# Patient Record
Sex: Male | Born: 1940 | Race: Black or African American | Hispanic: No | Marital: Married | Smoking: Former smoker
Health system: Southern US, Community
[De-identification: ages and names within clinical notes are randomized; demographics above are authoritative.]

## PROBLEM LIST (undated history)

## (undated) DIAGNOSIS — F32A Depression, unspecified: Secondary | ICD-10-CM

## (undated) DIAGNOSIS — D631 Anemia in chronic kidney disease: Secondary | ICD-10-CM

## (undated) DIAGNOSIS — N400 Enlarged prostate without lower urinary tract symptoms: Secondary | ICD-10-CM

## (undated) DIAGNOSIS — I2510 Atherosclerotic heart disease of native coronary artery without angina pectoris: Secondary | ICD-10-CM

## (undated) DIAGNOSIS — N189 Chronic kidney disease, unspecified: Secondary | ICD-10-CM

## (undated) DIAGNOSIS — N3000 Acute cystitis without hematuria: Secondary | ICD-10-CM

## (undated) DIAGNOSIS — I5042 Chronic combined systolic (congestive) and diastolic (congestive) heart failure: Secondary | ICD-10-CM

## (undated) DIAGNOSIS — N183 Chronic kidney disease, stage 3 unspecified: Secondary | ICD-10-CM

## (undated) DIAGNOSIS — Z9981 Dependence on supplemental oxygen: Secondary | ICD-10-CM

## (undated) DIAGNOSIS — D638 Anemia in other chronic diseases classified elsewhere: Secondary | ICD-10-CM

## (undated) DIAGNOSIS — I10 Essential (primary) hypertension: Secondary | ICD-10-CM

## (undated) DIAGNOSIS — M1990 Unspecified osteoarthritis, unspecified site: Secondary | ICD-10-CM

## (undated) DIAGNOSIS — I739 Peripheral vascular disease, unspecified: Secondary | ICD-10-CM

## (undated) DIAGNOSIS — Z7901 Long term (current) use of anticoagulants: Secondary | ICD-10-CM

## (undated) DIAGNOSIS — R778 Other specified abnormalities of plasma proteins: Secondary | ICD-10-CM

## (undated) DIAGNOSIS — M109 Gout, unspecified: Secondary | ICD-10-CM

## (undated) DIAGNOSIS — Z95810 Presence of automatic (implantable) cardiac defibrillator: Secondary | ICD-10-CM

## (undated) DIAGNOSIS — E039 Hypothyroidism, unspecified: Secondary | ICD-10-CM

## (undated) DIAGNOSIS — K219 Gastro-esophageal reflux disease without esophagitis: Secondary | ICD-10-CM

## (undated) DIAGNOSIS — L97919 Non-pressure chronic ulcer of unspecified part of right lower leg with unspecified severity: Secondary | ICD-10-CM

## (undated) DIAGNOSIS — I428 Other cardiomyopathies: Secondary | ICD-10-CM

## (undated) DIAGNOSIS — I447 Left bundle-branch block, unspecified: Secondary | ICD-10-CM

## (undated) DIAGNOSIS — I480 Paroxysmal atrial fibrillation: Secondary | ICD-10-CM

## (undated) DIAGNOSIS — I639 Cerebral infarction, unspecified: Secondary | ICD-10-CM

## (undated) DIAGNOSIS — R5381 Other malaise: Secondary | ICD-10-CM

## (undated) DIAGNOSIS — Z950 Presence of cardiac pacemaker: Secondary | ICD-10-CM

## (undated) DIAGNOSIS — R413 Other amnesia: Secondary | ICD-10-CM

## (undated) DIAGNOSIS — L97929 Non-pressure chronic ulcer of unspecified part of left lower leg with unspecified severity: Secondary | ICD-10-CM

## (undated) DIAGNOSIS — E870 Hyperosmolality and hypernatremia: Secondary | ICD-10-CM

## (undated) DIAGNOSIS — F329 Major depressive disorder, single episode, unspecified: Secondary | ICD-10-CM

## (undated) DIAGNOSIS — IMO0001 Reserved for inherently not codable concepts without codable children: Secondary | ICD-10-CM

## (undated) DIAGNOSIS — E876 Hypokalemia: Secondary | ICD-10-CM

## (undated) DIAGNOSIS — R195 Other fecal abnormalities: Secondary | ICD-10-CM

## (undated) DIAGNOSIS — J189 Pneumonia, unspecified organism: Secondary | ICD-10-CM

## (undated) DIAGNOSIS — E875 Hyperkalemia: Secondary | ICD-10-CM

## (undated) DIAGNOSIS — E1129 Type 2 diabetes mellitus with other diabetic kidney complication: Secondary | ICD-10-CM

## (undated) DIAGNOSIS — R7989 Other specified abnormal findings of blood chemistry: Secondary | ICD-10-CM

## (undated) DIAGNOSIS — Z9289 Personal history of other medical treatment: Secondary | ICD-10-CM

## (undated) DIAGNOSIS — E785 Hyperlipidemia, unspecified: Secondary | ICD-10-CM

## (undated) HISTORY — DX: Benign prostatic hyperplasia without lower urinary tract symptoms: N40.0

## (undated) HISTORY — DX: Type 2 diabetes mellitus with other diabetic kidney complication: E11.29

## (undated) HISTORY — DX: Long term (current) use of anticoagulants: Z79.01

## (undated) HISTORY — DX: Anemia in chronic kidney disease: D63.1

## (undated) HISTORY — DX: Depression, unspecified: F32.A

## (undated) HISTORY — DX: Hyperkalemia: E87.5

## (undated) HISTORY — DX: Unspecified osteoarthritis, unspecified site: M19.90

## (undated) HISTORY — DX: Chronic kidney disease, stage 3 unspecified: N18.30

## (undated) HISTORY — PX: OTHER SURGICAL HISTORY: SHX169

## (undated) HISTORY — DX: Chronic combined systolic (congestive) and diastolic (congestive) heart failure: I50.42

## (undated) HISTORY — DX: Other specified abnormalities of plasma proteins: R77.8

## (undated) HISTORY — DX: Personal history of other medical treatment: Z92.89

## (undated) HISTORY — DX: Major depressive disorder, single episode, unspecified: F32.9

## (undated) HISTORY — DX: Anemia in other chronic diseases classified elsewhere: D63.8

## (undated) HISTORY — DX: Hyperlipidemia, unspecified: E78.5

## (undated) HISTORY — DX: Hyperosmolality and hypernatremia: E87.0

## (undated) HISTORY — DX: Atherosclerotic heart disease of native coronary artery without angina pectoris: I25.10

## (undated) HISTORY — DX: Other disorders of bilirubin metabolism: E80.6

## (undated) HISTORY — DX: Chronic kidney disease, stage 3 (moderate): N18.3

## (undated) HISTORY — DX: Other specified abnormal findings of blood chemistry: R79.89

## (undated) HISTORY — DX: Other amnesia: R41.3

## (undated) HISTORY — PX: CARDIAC CATHETERIZATION: SHX172

## (undated) HISTORY — DX: Acute cystitis without hematuria: N30.00

## (undated) HISTORY — DX: Chronic kidney disease, unspecified: N18.9

## (undated) HISTORY — DX: Other fecal abnormalities: R19.5

## (undated) HISTORY — DX: Gastro-esophageal reflux disease without esophagitis: K21.9

## (undated) HISTORY — DX: Hypokalemia: E87.6

## (undated) HISTORY — DX: Essential (primary) hypertension: I10

## (undated) HISTORY — DX: Other malaise: R53.81

---

## 2002-11-29 ENCOUNTER — Ambulatory Visit (HOSPITAL_COMMUNITY): Admission: RE | Admit: 2002-11-29 | Discharge: 2002-11-29 | Payer: Self-pay | Admitting: Internal Medicine

## 2002-11-29 ENCOUNTER — Encounter: Payer: Self-pay | Admitting: Internal Medicine

## 2003-06-04 ENCOUNTER — Encounter: Payer: Self-pay | Admitting: Emergency Medicine

## 2003-06-04 ENCOUNTER — Encounter: Payer: Self-pay | Admitting: Neurology

## 2003-06-04 ENCOUNTER — Encounter (INDEPENDENT_AMBULATORY_CARE_PROVIDER_SITE_OTHER): Payer: Self-pay | Admitting: Cardiology

## 2003-06-04 ENCOUNTER — Observation Stay (HOSPITAL_COMMUNITY): Admission: EM | Admit: 2003-06-04 | Discharge: 2003-06-04 | Payer: Self-pay | Admitting: Emergency Medicine

## 2003-06-08 ENCOUNTER — Inpatient Hospital Stay (HOSPITAL_COMMUNITY): Admission: EM | Admit: 2003-06-08 | Discharge: 2003-06-10 | Payer: Self-pay | Admitting: Emergency Medicine

## 2003-06-08 ENCOUNTER — Encounter: Payer: Self-pay | Admitting: Emergency Medicine

## 2003-06-08 ENCOUNTER — Encounter: Payer: Self-pay | Admitting: Neurology

## 2003-06-09 ENCOUNTER — Encounter: Payer: Self-pay | Admitting: Neurology

## 2003-06-10 ENCOUNTER — Emergency Department (HOSPITAL_COMMUNITY): Admission: EM | Admit: 2003-06-10 | Discharge: 2003-06-10 | Payer: Self-pay | Admitting: Emergency Medicine

## 2003-06-14 ENCOUNTER — Inpatient Hospital Stay (HOSPITAL_COMMUNITY): Admission: EM | Admit: 2003-06-14 | Discharge: 2003-06-17 | Payer: Self-pay | Admitting: Emergency Medicine

## 2003-06-15 ENCOUNTER — Encounter: Payer: Self-pay | Admitting: Neurology

## 2003-06-16 ENCOUNTER — Encounter: Payer: Self-pay | Admitting: Internal Medicine

## 2003-06-17 ENCOUNTER — Encounter: Payer: Self-pay | Admitting: Cardiology

## 2003-06-25 ENCOUNTER — Encounter: Admission: RE | Admit: 2003-06-25 | Discharge: 2003-06-27 | Payer: Self-pay | Admitting: Internal Medicine

## 2003-07-05 ENCOUNTER — Encounter: Payer: Self-pay | Admitting: Internal Medicine

## 2003-07-05 ENCOUNTER — Encounter: Admission: RE | Admit: 2003-07-05 | Discharge: 2003-07-05 | Payer: Self-pay | Admitting: Internal Medicine

## 2004-02-13 ENCOUNTER — Emergency Department (HOSPITAL_COMMUNITY): Admission: EM | Admit: 2004-02-13 | Discharge: 2004-02-13 | Payer: Self-pay | Admitting: Emergency Medicine

## 2004-10-28 ENCOUNTER — Ambulatory Visit: Payer: Self-pay

## 2004-11-06 ENCOUNTER — Ambulatory Visit: Payer: Self-pay | Admitting: Cardiology

## 2004-11-16 ENCOUNTER — Ambulatory Visit: Payer: Self-pay | Admitting: Internal Medicine

## 2004-12-09 ENCOUNTER — Ambulatory Visit: Payer: Self-pay | Admitting: *Deleted

## 2004-12-24 ENCOUNTER — Ambulatory Visit: Payer: Self-pay | Admitting: Internal Medicine

## 2005-01-07 ENCOUNTER — Ambulatory Visit: Payer: Self-pay | Admitting: Cardiology

## 2005-01-28 ENCOUNTER — Ambulatory Visit: Payer: Self-pay | Admitting: Cardiology

## 2005-02-11 ENCOUNTER — Ambulatory Visit: Payer: Self-pay | Admitting: Internal Medicine

## 2005-03-11 ENCOUNTER — Ambulatory Visit: Payer: Self-pay | Admitting: Cardiology

## 2005-03-29 ENCOUNTER — Ambulatory Visit: Payer: Self-pay | Admitting: Cardiology

## 2005-04-19 ENCOUNTER — Ambulatory Visit: Payer: Self-pay | Admitting: Cardiology

## 2005-04-28 ENCOUNTER — Ambulatory Visit: Payer: Self-pay

## 2005-04-28 ENCOUNTER — Ambulatory Visit: Payer: Self-pay | Admitting: Internal Medicine

## 2005-05-18 ENCOUNTER — Ambulatory Visit: Payer: Self-pay | Admitting: *Deleted

## 2005-06-09 ENCOUNTER — Ambulatory Visit: Payer: Self-pay | Admitting: Internal Medicine

## 2005-06-15 ENCOUNTER — Ambulatory Visit: Payer: Self-pay | Admitting: Cardiology

## 2005-07-13 ENCOUNTER — Ambulatory Visit: Payer: Self-pay | Admitting: Cardiology

## 2005-08-10 ENCOUNTER — Ambulatory Visit: Payer: Self-pay | Admitting: Cardiology

## 2005-08-24 ENCOUNTER — Ambulatory Visit: Payer: Self-pay | Admitting: Cardiology

## 2005-09-03 ENCOUNTER — Ambulatory Visit: Payer: Self-pay | Admitting: Internal Medicine

## 2005-10-18 ENCOUNTER — Ambulatory Visit: Payer: Self-pay | Admitting: Internal Medicine

## 2005-11-03 ENCOUNTER — Ambulatory Visit: Payer: Self-pay | Admitting: Cardiology

## 2005-11-17 ENCOUNTER — Ambulatory Visit: Payer: Self-pay

## 2005-11-24 ENCOUNTER — Ambulatory Visit: Payer: Self-pay | Admitting: *Deleted

## 2005-12-01 ENCOUNTER — Ambulatory Visit: Payer: Self-pay | Admitting: Cardiology

## 2005-12-21 ENCOUNTER — Ambulatory Visit: Payer: Self-pay | Admitting: *Deleted

## 2005-12-30 ENCOUNTER — Ambulatory Visit: Payer: Self-pay | Admitting: Cardiology

## 2006-01-10 ENCOUNTER — Ambulatory Visit: Payer: Self-pay | Admitting: Internal Medicine

## 2006-01-24 ENCOUNTER — Ambulatory Visit: Payer: Self-pay | Admitting: Internal Medicine

## 2006-02-04 ENCOUNTER — Ambulatory Visit: Payer: Self-pay | Admitting: Cardiovascular Disease

## 2006-02-11 ENCOUNTER — Ambulatory Visit: Payer: Self-pay | Admitting: Internal Medicine

## 2006-02-25 ENCOUNTER — Ambulatory Visit: Payer: Self-pay | Admitting: Internal Medicine

## 2006-03-07 ENCOUNTER — Ambulatory Visit: Payer: Self-pay | Admitting: Internal Medicine

## 2006-03-28 ENCOUNTER — Ambulatory Visit: Payer: Self-pay | Admitting: Cardiology

## 2006-04-01 ENCOUNTER — Ambulatory Visit: Payer: Self-pay | Admitting: Endocrinology

## 2006-04-25 ENCOUNTER — Ambulatory Visit: Payer: Self-pay | Admitting: Internal Medicine

## 2006-05-12 ENCOUNTER — Ambulatory Visit: Payer: Self-pay | Admitting: Cardiology

## 2006-06-10 ENCOUNTER — Ambulatory Visit: Payer: Self-pay | Admitting: Cardiovascular Disease

## 2006-06-20 ENCOUNTER — Ambulatory Visit: Payer: Self-pay | Admitting: Cardiovascular Disease

## 2006-07-07 ENCOUNTER — Ambulatory Visit: Payer: Self-pay | Admitting: Cardiology

## 2006-09-14 ENCOUNTER — Ambulatory Visit: Payer: Self-pay | Admitting: Cardiology

## 2006-09-22 ENCOUNTER — Ambulatory Visit: Payer: Self-pay | Admitting: Internal Medicine

## 2006-10-12 ENCOUNTER — Ambulatory Visit: Payer: Self-pay | Admitting: *Deleted

## 2006-11-09 ENCOUNTER — Ambulatory Visit: Payer: Self-pay | Admitting: Cardiology

## 2006-12-21 ENCOUNTER — Ambulatory Visit: Payer: Self-pay | Admitting: Internal Medicine

## 2006-12-21 LAB — CONVERTED CEMR LAB
BUN: 18 mg/dL (ref 6–23)
Cholesterol: 142 mg/dL (ref 0–200)
Creatinine, Ser: 2.1 mg/dL — ABNORMAL HIGH (ref 0.4–1.5)
HDL: 37.3 mg/dL — ABNORMAL LOW (ref >39.0)
VLDL: 18 mg/dL (ref 0–40)

## 2007-01-04 ENCOUNTER — Ambulatory Visit: Payer: Self-pay | Admitting: Internal Medicine

## 2007-01-30 ENCOUNTER — Ambulatory Visit: Payer: Self-pay | Admitting: Cardiology

## 2007-02-13 ENCOUNTER — Ambulatory Visit: Payer: Self-pay | Admitting: Cardiology

## 2007-02-22 ENCOUNTER — Ambulatory Visit: Payer: Self-pay | Admitting: *Deleted

## 2007-04-05 ENCOUNTER — Ambulatory Visit: Payer: Self-pay | Admitting: Cardiology

## 2007-04-06 ENCOUNTER — Ambulatory Visit: Payer: Self-pay | Admitting: Internal Medicine

## 2007-04-14 ENCOUNTER — Emergency Department (HOSPITAL_COMMUNITY): Admission: EM | Admit: 2007-04-14 | Discharge: 2007-04-14 | Payer: Self-pay | Admitting: Emergency Medicine

## 2007-04-19 ENCOUNTER — Ambulatory Visit: Payer: Self-pay | Admitting: Internal Medicine

## 2007-05-10 ENCOUNTER — Ambulatory Visit: Payer: Self-pay | Admitting: Cardiology

## 2007-06-07 ENCOUNTER — Ambulatory Visit: Payer: Self-pay | Admitting: Cardiology

## 2007-07-05 ENCOUNTER — Ambulatory Visit: Payer: Self-pay | Admitting: Internal Medicine

## 2007-08-02 ENCOUNTER — Ambulatory Visit: Payer: Self-pay | Admitting: Internal Medicine

## 2007-08-16 ENCOUNTER — Ambulatory Visit: Payer: Self-pay

## 2007-08-28 ENCOUNTER — Emergency Department (HOSPITAL_COMMUNITY): Admission: EM | Admit: 2007-08-28 | Discharge: 2007-08-28 | Payer: Self-pay | Admitting: Emergency Medicine

## 2007-09-13 ENCOUNTER — Ambulatory Visit: Payer: Self-pay | Admitting: Internal Medicine

## 2007-09-18 ENCOUNTER — Encounter: Payer: Self-pay | Admitting: *Deleted

## 2007-09-18 DIAGNOSIS — N183 Chronic kidney disease, stage 3 (moderate): Secondary | ICD-10-CM

## 2007-09-18 DIAGNOSIS — E785 Hyperlipidemia, unspecified: Secondary | ICD-10-CM

## 2007-09-18 DIAGNOSIS — I6350 Cerebral infarction due to unspecified occlusion or stenosis of unspecified cerebral artery: Secondary | ICD-10-CM | POA: Insufficient documentation

## 2007-10-11 ENCOUNTER — Ambulatory Visit: Payer: Self-pay | Admitting: Cardiology

## 2007-11-08 ENCOUNTER — Ambulatory Visit: Payer: Self-pay | Admitting: Cardiology

## 2007-12-29 ENCOUNTER — Ambulatory Visit: Payer: Self-pay | Admitting: Cardiology

## 2008-01-26 ENCOUNTER — Ambulatory Visit: Payer: Self-pay | Admitting: Cardiology

## 2008-03-01 ENCOUNTER — Ambulatory Visit: Payer: Self-pay | Admitting: Internal Medicine

## 2008-04-01 ENCOUNTER — Ambulatory Visit: Payer: Self-pay | Admitting: Internal Medicine

## 2008-04-01 DIAGNOSIS — M1990 Unspecified osteoarthritis, unspecified site: Secondary | ICD-10-CM

## 2008-04-01 DIAGNOSIS — M79609 Pain in unspecified limb: Secondary | ICD-10-CM

## 2008-04-02 ENCOUNTER — Encounter: Payer: Self-pay | Admitting: Internal Medicine

## 2008-04-02 ENCOUNTER — Ambulatory Visit: Payer: Self-pay | Admitting: Cardiology

## 2008-04-02 LAB — CONVERTED CEMR LAB
CO2: 33 meq/L — ABNORMAL HIGH (ref 19–32)
Chloride: 100 meq/L (ref 96–112)
Glucose, Bld: 126 mg/dL — ABNORMAL HIGH (ref 70–99)
Sed Rate: 32 mm/hr — ABNORMAL HIGH (ref 0–16)
Uric Acid, Serum: 10.1 mg/dL — ABNORMAL HIGH (ref 4.0–7.8)

## 2008-04-19 ENCOUNTER — Encounter: Payer: Self-pay | Admitting: Internal Medicine

## 2008-04-30 ENCOUNTER — Ambulatory Visit: Payer: Self-pay | Admitting: Cardiovascular Disease

## 2008-05-01 ENCOUNTER — Encounter: Payer: Self-pay | Admitting: Internal Medicine

## 2008-07-11 ENCOUNTER — Ambulatory Visit: Payer: Self-pay | Admitting: Internal Medicine

## 2008-09-03 ENCOUNTER — Ambulatory Visit: Payer: Self-pay | Admitting: Cardiology

## 2008-09-17 ENCOUNTER — Ambulatory Visit: Payer: Self-pay | Admitting: Cardiology

## 2008-10-08 ENCOUNTER — Ambulatory Visit: Payer: Self-pay | Admitting: Internal Medicine

## 2008-11-05 ENCOUNTER — Ambulatory Visit: Payer: Self-pay | Admitting: Cardiovascular Disease

## 2008-11-20 ENCOUNTER — Ambulatory Visit: Payer: Self-pay | Admitting: Internal Medicine

## 2008-12-03 ENCOUNTER — Ambulatory Visit: Payer: Self-pay | Admitting: Internal Medicine

## 2008-12-11 ENCOUNTER — Ambulatory Visit: Payer: Self-pay | Admitting: Cardiovascular Disease

## 2008-12-19 ENCOUNTER — Ambulatory Visit: Payer: Self-pay | Admitting: Internal Medicine

## 2009-01-02 ENCOUNTER — Ambulatory Visit: Payer: Self-pay | Admitting: Cardiovascular Disease

## 2009-01-28 ENCOUNTER — Ambulatory Visit: Payer: Self-pay | Admitting: Cardiology

## 2009-02-07 ENCOUNTER — Ambulatory Visit: Payer: Self-pay | Admitting: Cardiovascular Disease

## 2009-02-21 ENCOUNTER — Ambulatory Visit: Payer: Self-pay | Admitting: Internal Medicine

## 2009-03-20 ENCOUNTER — Ambulatory Visit: Payer: Self-pay | Admitting: Cardiovascular Disease

## 2009-04-22 ENCOUNTER — Ambulatory Visit: Payer: Self-pay | Admitting: Internal Medicine

## 2009-04-22 DIAGNOSIS — Z8679 Personal history of other diseases of the circulatory system: Secondary | ICD-10-CM | POA: Insufficient documentation

## 2009-04-22 DIAGNOSIS — K219 Gastro-esophageal reflux disease without esophagitis: Secondary | ICD-10-CM

## 2009-04-22 DIAGNOSIS — R21 Rash and other nonspecific skin eruption: Secondary | ICD-10-CM | POA: Insufficient documentation

## 2009-04-22 DIAGNOSIS — N400 Enlarged prostate without lower urinary tract symptoms: Secondary | ICD-10-CM | POA: Insufficient documentation

## 2009-04-29 ENCOUNTER — Ambulatory Visit: Payer: Self-pay | Admitting: Cardiology

## 2009-05-20 ENCOUNTER — Encounter: Payer: Self-pay | Admitting: *Deleted

## 2009-05-28 ENCOUNTER — Ambulatory Visit: Payer: Self-pay | Admitting: Cardiology

## 2009-05-28 LAB — CONVERTED CEMR LAB: Protime: 15.7

## 2009-06-25 ENCOUNTER — Encounter: Payer: Self-pay | Admitting: *Deleted

## 2009-07-11 ENCOUNTER — Ambulatory Visit: Payer: Self-pay | Admitting: Internal Medicine

## 2009-07-11 LAB — CONVERTED CEMR LAB: Prothrombin Time: 24.9 s

## 2009-07-21 ENCOUNTER — Telehealth: Payer: Self-pay | Admitting: Internal Medicine

## 2009-07-24 ENCOUNTER — Ambulatory Visit: Payer: Self-pay | Admitting: Cardiology

## 2009-07-24 LAB — CONVERTED CEMR LAB: Prothrombin Time: 21.4 s

## 2009-08-10 ENCOUNTER — Emergency Department (HOSPITAL_COMMUNITY): Admission: EM | Admit: 2009-08-10 | Discharge: 2009-08-10 | Payer: Self-pay | Admitting: Emergency Medicine

## 2009-08-13 ENCOUNTER — Ambulatory Visit: Payer: Self-pay | Admitting: Internal Medicine

## 2009-08-13 DIAGNOSIS — T25229A Burn of second degree of unspecified foot, initial encounter: Secondary | ICD-10-CM

## 2009-08-14 ENCOUNTER — Ambulatory Visit: Payer: Self-pay | Admitting: Cardiovascular Disease

## 2009-08-27 ENCOUNTER — Ambulatory Visit: Payer: Self-pay | Admitting: Internal Medicine

## 2009-09-18 ENCOUNTER — Encounter (INDEPENDENT_AMBULATORY_CARE_PROVIDER_SITE_OTHER): Payer: Self-pay | Admitting: Pharmacist

## 2009-09-22 ENCOUNTER — Ambulatory Visit: Payer: Self-pay | Admitting: Internal Medicine

## 2009-09-22 LAB — CONVERTED CEMR LAB: POC INR: 3.8

## 2009-10-06 ENCOUNTER — Ambulatory Visit: Payer: Self-pay | Admitting: Cardiovascular Disease

## 2009-10-27 ENCOUNTER — Ambulatory Visit: Payer: Self-pay | Admitting: Internal Medicine

## 2009-11-06 ENCOUNTER — Telehealth: Payer: Self-pay | Admitting: Internal Medicine

## 2009-12-29 ENCOUNTER — Ambulatory Visit: Payer: Self-pay | Admitting: Internal Medicine

## 2009-12-29 ENCOUNTER — Encounter (INDEPENDENT_AMBULATORY_CARE_PROVIDER_SITE_OTHER): Payer: Self-pay | Admitting: Cardiology

## 2009-12-29 LAB — CONVERTED CEMR LAB: POC INR: 1.6

## 2010-01-21 ENCOUNTER — Ambulatory Visit: Payer: Self-pay | Admitting: Internal Medicine

## 2010-02-09 ENCOUNTER — Telehealth: Payer: Self-pay | Admitting: Internal Medicine

## 2010-02-13 ENCOUNTER — Ambulatory Visit: Payer: Self-pay | Admitting: Cardiology

## 2010-02-25 ENCOUNTER — Encounter: Admission: RE | Admit: 2010-02-25 | Discharge: 2010-02-25 | Payer: Self-pay | Admitting: Podiatry

## 2010-04-15 ENCOUNTER — Ambulatory Visit: Payer: Self-pay | Admitting: Internal Medicine

## 2010-04-15 LAB — CONVERTED CEMR LAB: POC INR: 1.7

## 2010-05-06 ENCOUNTER — Ambulatory Visit: Payer: Self-pay | Admitting: Internal Medicine

## 2010-05-06 LAB — CONVERTED CEMR LAB: POC INR: 1.9

## 2010-05-27 ENCOUNTER — Ambulatory Visit: Payer: Self-pay | Admitting: Cardiology

## 2010-05-27 LAB — CONVERTED CEMR LAB: POC INR: 2.6

## 2010-06-17 ENCOUNTER — Ambulatory Visit: Payer: Self-pay | Admitting: Internal Medicine

## 2010-06-17 DIAGNOSIS — R413 Other amnesia: Secondary | ICD-10-CM | POA: Insufficient documentation

## 2010-06-17 DIAGNOSIS — E538 Deficiency of other specified B group vitamins: Secondary | ICD-10-CM | POA: Insufficient documentation

## 2010-06-17 DIAGNOSIS — R5382 Chronic fatigue, unspecified: Secondary | ICD-10-CM

## 2010-06-18 DIAGNOSIS — E1129 Type 2 diabetes mellitus with other diabetic kidney complication: Secondary | ICD-10-CM

## 2010-06-18 LAB — CONVERTED CEMR LAB
ALT: 16 units/L (ref 0–53)
AST: 19 units/L (ref 0–37)
BUN: 17 mg/dL (ref 6–23)
Basophils Relative: 0.2 % (ref 0.0–3.0)
Bilirubin Urine: NEGATIVE
Chloride: 106 meq/L (ref 96–112)
Cholesterol: 257 mg/dL — ABNORMAL HIGH (ref 0–200)
Eosinophils Relative: 1 % (ref 0.0–5.0)
Folate: 7.1 ng/mL
GFR calc non Af Amer: 62.68 mL/min (ref >60)
HCT: 40 % (ref 39.0–52.0)
Hemoglobin, Urine: NEGATIVE
Hemoglobin: 13.3 g/dL (ref 13.0–17.0)
Hgb A1c MFr Bld: 7.6 % — ABNORMAL HIGH (ref 4.6–6.5)
Lymphs Abs: 1.3 10*3/uL (ref 0.7–4.0)
MCV: 80.7 fL (ref 78.0–100.0)
Monocytes Absolute: 0.5 10*3/uL (ref 0.1–1.0)
Monocytes Relative: 5.4 % (ref 3.0–12.0)
Neutro Abs: 7.5 10*3/uL (ref 1.4–7.7)
Nitrite: NEGATIVE
Potassium: 3.3 meq/L — ABNORMAL LOW (ref 3.5–5.1)
Pro B Natriuretic peptide (BNP): 41.1 pg/mL (ref 0.0–100.0)
RBC: 4.96 M/uL (ref 4.22–5.81)
Sodium: 148 meq/L — ABNORMAL HIGH (ref 135–145)
TSH: 1.47 microintl units/mL (ref 0.35–5.50)
Total Bilirubin: 0.5 mg/dL (ref 0.3–1.2)
Total Protein, Urine: 100 mg/dL
Total Protein: 6.9 g/dL (ref 6.0–8.3)
Urine Glucose: NEGATIVE mg/dL
Urobilinogen, UA: 1 (ref 0.0–1.0)
VLDL: 33.4 mg/dL (ref 0.0–40.0)
Vitamin B-12: 184 pg/mL — ABNORMAL LOW (ref 211–911)
WBC: 9.5 10*3/uL (ref 4.5–10.5)

## 2010-07-13 ENCOUNTER — Ambulatory Visit: Payer: Self-pay | Admitting: Cardiology

## 2010-07-13 LAB — CONVERTED CEMR LAB: POC INR: 3.3

## 2010-07-16 ENCOUNTER — Ambulatory Visit: Payer: Self-pay | Admitting: Internal Medicine

## 2010-07-17 ENCOUNTER — Ambulatory Visit: Payer: Self-pay | Admitting: Internal Medicine

## 2010-08-03 ENCOUNTER — Ambulatory Visit: Payer: Self-pay | Admitting: Cardiology

## 2010-08-21 ENCOUNTER — Ambulatory Visit: Payer: Self-pay | Admitting: Cardiology

## 2010-08-21 LAB — CONVERTED CEMR LAB: POC INR: 2.5

## 2010-10-21 ENCOUNTER — Telehealth: Payer: Self-pay | Admitting: Internal Medicine

## 2010-10-22 ENCOUNTER — Ambulatory Visit: Payer: Self-pay | Admitting: Cardiology

## 2010-10-22 LAB — CONVERTED CEMR LAB: POC INR: 2.1

## 2010-11-19 ENCOUNTER — Ambulatory Visit: Payer: Self-pay | Admitting: Cardiovascular Disease

## 2010-11-19 LAB — CONVERTED CEMR LAB: POC INR: 2.7

## 2010-11-23 ENCOUNTER — Telehealth: Payer: Self-pay | Admitting: Internal Medicine

## 2010-12-17 ENCOUNTER — Ambulatory Visit: Payer: Self-pay

## 2010-12-31 HISTORY — PX: TRANSTHORACIC ECHOCARDIOGRAM: SHX275

## 2011-01-12 ENCOUNTER — Ambulatory Visit
Admission: RE | Admit: 2011-01-12 | Discharge: 2011-01-12 | Payer: Self-pay | Source: Home / Self Care | Attending: Internal Medicine | Admitting: Internal Medicine

## 2011-01-21 NOTE — Medication Information (Signed)
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 no     Any missed doses?: yes     Details: Missed a dose 3 or 4 days ago.   Is patient compliant with meds? yes       Allergies: 1)  Ace Inhibitors  Anticoagulation Management History:      The patient is taking warfarin and comes in today for a routine follow up visit.  Positive risk factors for bleeding include an age of 72 years or older, history of CVA/TIA, and presence of serious comorbidities.  The bleeding index is 'high risk'.  Positive CHADS2 values include History of CHF, History of HTN, History of Diabetes, and Prior Stroke/CVA/TIA.  Negative CHADS2 values include Age > 4 years old.  The start date was 07/09/2003.  Anticoagulation responsible Aysia Lowder: Riley Kill MD, Maisie Fus.  Cuvette Lot#: [id number].  Exp: 09/2011.    Anticoagulation Management Assessment/Plan:      [name] current anticoagulation dose is Coumadin 5 mg tabs: Take as directed by coumadin clinic..  The target INR is 2 - 3.  The next INR is due 08/21/2010.  Anticoagulation instructions were given to patient.  Results were reviewed/authorized by Bethena Midget, RN, BSN.  He was notified by Gweneth Fritter, PharmD Candidate.         Prior Anticoagulation Instructions: INR 3.3 Today only take 1/2 pill then resume 1 pill everyday except 1 1/2 pills on Mondays, Wednesdays and Fridays. Recheck in 3 weeks.   Current Anticoagulation Instructions: INR 2.4  Take 1 tablet (5mg ) every day except take 1.5 tablets (7.5mg ) on Mondays and Fridays.  Recheck in 3 weeks.

## 2011-01-21 NOTE — Progress Notes (Signed)
Summary: Labetalol ?  Phone Note From Pharmacy   [facility]  [address] [phone]* Summary of Call: rec eRf request for Labetalol 1 by mouth three times a day  #90. Our med list says 1 by mouth once daily.  Which sig is correct? Initial call taken by: Lanier Prude, [facility]),  October 21, 2010 8:12 AM  Follow-up for Phone Call        If he was taking it three times a day - cont tid Follow-up by: Tresa Garter MD,  October 21, 2010 1:15 PM  Additional Follow-up for Phone Call Additional follow up Details #1::        per pt's wife:  pt has been taking Labetalol 100mg  1 by mouth three times a day so will Rf it that way. Additional Follow-up by: Lanier Prude, [facility]),  October 21, 2010 4:19 PM    New/Updated Medications: LABETALOL HCL 100 MG TABS (LABETALOL HCL) 1 by mouth three times a day Prescriptions: LABETALOL HCL 100 MG TABS (LABETALOL HCL) 1 by mouth three times a day  #90 x 6   Entered by:   Lanier Prude, CMA(AAMA)   Authorized by:   Tresa Garter MD   Signed by:   Lanier Prude, CMA(AAMA) on 10/21/2010   Method used:   [facility]ronically to        [facility]  [address] [phone]* (retail)       [address]       [place], [place]  [id number]       Ph: [phone] or [phone]       Fax: [phone]   RxID:   [id number]

## 2011-01-21 NOTE — Medication Information (Signed)
Summary: rov/ewj  Anticoagulant Therapy  Managed by: Cloyde Reams, RN, BSN Referring MD: [name] Supervising MD: Jens Som MD, Arlys John Indication 1: CVA-stroke (ICD-436) Lab Used: [facility] Site: [facility] INR POC 2.8 INR RANGE 2 - 3  Dietary changes: no    Health status changes: no    Bleeding/hemorrhagic complications: no    Recent/future hospitalizations: no    Any changes in medication regimen? no    Recent/future dental: no  Any missed doses?: no       Is patient compliant with meds? yes       Allergies (verified): 1)  Ace Inhibitors  Anticoagulation Management History:      The patient is taking warfarin and comes in today for a routine follow up visit.  Positive risk factors for bleeding include an age of 72 years or older, history of CVA/TIA, and presence of serious comorbidities.  The bleeding index is 'high risk'.  Positive CHADS2 values include History of CHF, History of HTN, and Prior Stroke/CVA/TIA.  Negative CHADS2 values include Age > 49 years old.  The start date was 07/09/2003.  Anticoagulation responsible provider: Jens Som MD, Arlys John.  INR POC: 2.8.  Cuvette Lot#: [id number].  Exp: 03/2011.    Anticoagulation Management Assessment/Plan:      [name] current anticoagulation dose is Coumadin 5 mg tabs: Take as directed by coumadin clinic..  The target INR is 2 - 3.  The next INR is due 03/13/2010.  Anticoagulation instructions were given to patient.  Results were reviewed/authorized by Cloyde Reams, RN, BSN.  He was notified by Cloyde Reams RN.         Prior Anticoagulation Instructions: INR 1.7 Today take 2 pills then continue 1 pill everyday except  1 1/2 pills on Mondays and Wednesdays. Recheck in 2 weeks.   Current Anticoagulation Instructions: INR 2.8  Continue on same dosage 1 tablet daily except 1.5 tablets on Mondays and Wednesdays.  Recheck in 4 weeks.

## 2011-01-21 NOTE — Medication Information (Signed)
Summary: rov/sp  Anticoagulant Therapy  Managed by: Weston Brass, PharmD Referring MD: [name] Supervising MD: Gala Romney MD, Reuel Boom Indication 1: CVA-stroke (ICD-436) Lab Used: [facility] Site: [facility] INR POC 1.9 INR RANGE 2 - 3  Dietary changes: no    Health status changes: no    Bleeding/hemorrhagic complications: no    Recent/future hospitalizations: no    Any changes in medication regimen? no    Recent/future dental: no  Any missed doses?: no       Is patient compliant with meds? yes       Allergies: 1)  Ace Inhibitors  Anticoagulation Management History:      The patient is taking warfarin and comes in today for a routine follow up visit.  Positive risk factors for bleeding include an age of 72 years or older, history of CVA/TIA, and presence of serious comorbidities.  The bleeding index is 'high risk'.  Positive CHADS2 values include History of CHF, History of HTN, and Prior Stroke/CVA/TIA.  Negative CHADS2 values include Age > 72 years old.  The start date was 07/09/2003.  Anticoagulation responsible provider: Karmelo Bass MD, Reuel Boom.  INR POC: 1.9.  Cuvette Lot#: [id number].  Exp: 07/2011.    Anticoagulation Management Assessment/Plan:      [name] current anticoagulation dose is Coumadin 5 mg tabs: Take as directed by coumadin clinic..  The target INR is 2 - 3.  The next INR is due 05/27/2010.  Anticoagulation instructions were given to patient.  Results were reviewed/authorized by Weston Brass, PharmD.  He was notified by Weston Brass PharmD.         Prior Anticoagulation Instructions: INR 1.7  Take 2 tablets today then resume same dose of 1 tablet every day except 1 1/2 tablets on Monday and Wednesday   Current Anticoagulation Instructions: INR 1.9  Increase dose to 1 tablet every day except 1 1/2 tablets on Monday, Wednesday and Friday

## 2011-01-21 NOTE — Medication Information (Signed)
Summary: ccr   Anticoagulant Therapy  Managed by: Weston Brass, PharmD Referring MD: [name] Supervising MD: Graciela Husbands MD, Viviann Spare Indication 1: CVA-stroke (ICD-436) Lab Used: [facility] Site: [facility] INR POC 1.9 INR RANGE 2 - 3  Dietary changes: no    Health status changes: no    Bleeding/hemorrhagic complications: no    Recent/future hospitalizations: no    Any changes in medication regimen? no    Recent/future dental: no  Any missed doses?: no       Is patient compliant with meds? yes       Allergies: 1)  Ace Inhibitors  Anticoagulation Management History:      Positive risk factors for bleeding include an age of 72 years or older, history of CVA/TIA, and presence of serious comorbidities.  The bleeding index is 'high risk'.  Positive CHADS2 values include History of CHF, History of HTN, History of Diabetes, and Prior Stroke/CVA/TIA.  Negative CHADS2 values include Age > 18 years old.  The start date was 07/09/2003.  Anticoagulation responsible provider: Graciela Husbands MD, Viviann Spare.  INR POC: 1.9.  Exp: 11/2011.    Anticoagulation Management Assessment/Plan:      [name] current anticoagulation dose is Coumadin 5 mg tabs: Take as directed by coumadin clinic..  The target INR is 2 - 3.  The next INR is due 02/09/2011.  Anticoagulation instructions were given to patient.  Results were reviewed/authorized by Weston Brass, PharmD.  He was notified by Linward Headland, PharmD candidate.         Prior Anticoagulation Instructions: INR 2.7 Continue taking 1.5 tablets on monday and friday. And 1 tablet all other days. Recheck in 4 weeks.  Current Anticoagulation Instructions: INR 1.9 (goal INR: 2-3)  Take an extra half tablet today (Tuesday).  Resume normal schedule on Wednesday of 1 tablet everyday except 1 and 1/2 tablets on Mondays and Fridays.  Recheck in 4 weeks.

## 2011-01-21 NOTE — Letter (Signed)
Summary: Handout Printed  Printed Handout:  - Coumadin Instructions-w/out Meds 

## 2011-01-21 NOTE — Progress Notes (Signed)
Summary: Vicodin Rf  Phone Note Refill Request Message from:  Fax from Pharmacy  Refills Requested: Medication #1:  VICODIN 5-500 MG  TABS 1 by mouth qid as needed pain   Dosage confirmed as above?Dosage Confirmed   Supply Requested: 90   Last Refilled: 07/27/2010  Method Requested: Telephone to Pharmacy Initial call taken by: Lanier Prude, [facility]),  November 23, 2010 12:03 PM  Follow-up for Phone Call        ok to ref Follow-up by: Tresa Garter MD,  November 24, 2010 8:42 AM  Additional Follow-up for Phone Call Additional follow up Details #1::        Rx called to pharmacy Additional Follow-up by: Lanier Prude, [facility]),  November 24, 2010 12:06 PM    Prescriptions: VICODIN 5-500 MG  TABS (HYDROCODONE-ACETAMINOPHEN) 1 by mouth qid as needed pain  #90 x 1   Entered by:   Lanier Prude, CMA(AAMA)   Authorized by:   Tresa Garter MD   Signed by:   Lanier Prude, CMA(AAMA) on 11/24/2010   Method used:   Telephoned to ...       [facility]  [address] [phone]* (retail)       [address]       [place], [place]  [address]       Ph: [phone] or [phone]       Fax: [phone]   RxID:   [id number]

## 2011-01-21 NOTE — Medication Information (Signed)
Summary: rov.mp  Anticoagulant Therapy  Managed by: Shelby Dubin, PharmD, BCPS, CPP Referring MD: [name] Supervising MD: Tenny Craw MD, Gunnar Fusi Indication 1: CVA-stroke (ICD-436) Lab Used: [facility] Site: [facility] INR POC 1.6 INR RANGE 2 - 3  Dietary changes: no    Health status changes: no    Bleeding/hemorrhagic complications: no    Recent/future hospitalizations: no    Any changes in medication regimen? no    Recent/future dental: no  Any missed doses?: no       Is patient compliant with meds? yes       Allergies (verified): 1)  Ace Inhibitors  Anticoagulation Management History:      The patient is taking warfarin and comes in today for a routine follow up visit.  Positive risk factors for bleeding include an age of 47 years or older, history of CVA/TIA, and presence of serious comorbidities.  The bleeding index is 'high risk'.  Positive CHADS2 values include History of CHF, History of HTN, and Prior Stroke/CVA/TIA.  Negative CHADS2 values include Age > 38 years old.  The start date was 07/09/2003.  Anticoagulation responsible provider: Tenny Craw MD, Gunnar Fusi.  INR POC: 1.6.  Cuvette Lot#: [id number].  Exp: 01/2011.    Anticoagulation Management Assessment/Plan:      [name] current anticoagulation dose is Coumadin 5 mg tabs: Take as directed by coumadin clinic..  The target INR is 2 - 3.  The next INR is due 01/12/2010.  Anticoagulation instructions were given to patient.  Results were reviewed/authorized by Shelby Dubin, PharmD, BCPS, CPP.  He was notified by Shelby Dubin PharmD, BCPS, CPP.         Prior Anticoagulation Instructions: INR 2.9  Continue on same dosage 1 tablet daily except 1.5 tablets on Mondays and Wednesdays.  Recheck in 4 weeks.    Current Anticoagulation Instructions: INR 1.6  Take 2 tabs for 2 days then resume 1.5 tabs each Monday and Wednesday and 1 tab on all other days.  Recheck in 2 weeks.   Prescriptions: COUMADIN 5 MG TABS (WARFARIN  SODIUM) Take as directed by coumadin clinic.  #60 Tablet x 1   Entered by:   Shelby Dubin PharmD, BCPS, CPP   Authorized by:   Sherrill Raring, MD, [facility]   Signed by:   Shelby Dubin PharmD, BCPS, CPP on 12/29/2009   Method used:   Electronically to        [facility] [phone]* (retail)       [address]       [place], [place]  [address]       Ph: [phone] or [phone]       Fax: [phone]   RxID:   [id number]

## 2011-01-21 NOTE — Medication Information (Signed)
Summary: ccr  Anticoagulant Therapy  Managed by: Bethena Midget, RN, BSN Referring MD: [name] Supervising MD: Gala Romney MD, Reuel Boom Indication 1: CVA-stroke (ICD-436) Lab Used: [facility] Site: [facility] INR POC 1.7 INR RANGE 2 - 3  Dietary changes: no    Health status changes: no    Bleeding/hemorrhagic complications: no    Recent/future hospitalizations: no    Any changes in medication regimen? no    Recent/future dental: no  Any missed doses?: yes     Details: missed a dose 3 days ago  Is patient compliant with meds? yes       Allergies: 1)  Ace Inhibitors  Anticoagulation Management History:      The patient is taking warfarin and comes in today for a routine follow up visit.  Positive risk factors for bleeding include an age of 70 years or older, history of CVA/TIA, and presence of serious comorbidities.  The bleeding index is 'high risk'.  Positive CHADS2 values include History of CHF, History of HTN, and Prior Stroke/CVA/TIA.  Negative CHADS2 values include Age > 38 years old.  The start date was 07/09/2003.  Anticoagulation responsible provider: Mouhamed Glassco MD, Reuel Boom.  INR POC: 1.7.  Cuvette Lot#: [id number].  Exp: 03/2011.    Anticoagulation Management Assessment/Plan:      [name] current anticoagulation dose is Coumadin 5 mg tabs: Take as directed by coumadin clinic..  The target INR is 2 - 3.  The next INR is due 02/04/2010.  Anticoagulation instructions were given to patient.  Results were reviewed/authorized by Bethena Midget, RN, BSN.  He was notified by Bethena Midget, RN, BSN.         Prior Anticoagulation Instructions: INR 1.6  Take 2 tabs for 2 days then resume 1.5 tabs each Monday and Wednesday and 1 tab on all other days.  Recheck in 2 weeks.    Current Anticoagulation Instructions: INR 1.7 Today take 2 pills then continue 1 pill everyday except  1 1/2 pills on Mondays and Wednesdays. Recheck in 2 weeks.

## 2011-01-21 NOTE — Medication Information (Signed)
Summary: rov/jk  Anticoagulant Therapy  Managed by: Weston Brass, PharmD Referring MD: [name] Supervising MD: Daleen Squibb MD, Maisie Fus Indication 1: CVA-stroke (ICD-436) Lab Used: [facility] Site: [facility] INR POC 2.5 INR RANGE 2 - 3  Dietary changes: no    Health status changes: no    Bleeding/hemorrhagic complications: no    Recent/future hospitalizations: no    Any changes in medication regimen? no    Recent/future dental: no  Any missed doses?: no       Is patient compliant with meds? yes       Allergies: 1)  Ace Inhibitors  Anticoagulation Management History:      Positive risk factors for bleeding include an age of 72 years or older, history of CVA/TIA, and presence of serious comorbidities.  The bleeding index is 'high risk'.  Positive CHADS2 values include History of CHF, History of HTN, History of Diabetes, and Prior Stroke/CVA/TIA.  Negative CHADS2 values include Age > 67 years old.  The start date was 07/09/2003.  Anticoagulation responsible provider: Daleen Squibb MD, Maisie Fus.  INR POC: 2.5.  Exp: 09/2011.    Anticoagulation Management Assessment/Plan:      [name] current anticoagulation dose is Coumadin 5 mg tabs: Take as directed by coumadin clinic..  The target INR is 2 - 3.  The next INR is due 09/18/2010.  Anticoagulation instructions were given to patient.  Results were reviewed/authorized by Weston Brass, PharmD.  He was notified by Weston Brass PharmD.         Prior Anticoagulation Instructions: INR 2.4  Take 1 tablet (5mg ) every day except take 1.5 tablets (7.5mg ) on Mondays and Fridays.  Recheck in 3 weeks.    Current Anticoagulation Instructions: INR 2.5  Continue same dose of 1 tablet every day except 1 1/2 tablets on Monday and Friday.  Recheck INR in 4 weeks.

## 2011-01-21 NOTE — Medication Information (Signed)
Summary: rov/tm  Anticoagulant Therapy  Managed by: Weston Brass, PharmD Referring MD: [name] Supervising MD: Gala Romney MD, Reuel Boom Indication 1: CVA-stroke (ICD-436) Lab Used: [facility] Site: [facility] INR POC 1.7 INR RANGE 2 - 3  Dietary changes: yes       Details: increased greens over last week- had company in town  Health status changes: no    Bleeding/hemorrhagic complications: no    Recent/future hospitalizations: no    Any changes in medication regimen? no    Recent/future dental: no  Any missed doses?: no       Is patient compliant with meds? yes       Allergies: 1)  Ace Inhibitors  Anticoagulation Management History:      The patient is taking warfarin and comes in today for a routine follow up visit.  Positive risk factors for bleeding include an age of 71 years or older, history of CVA/TIA, and presence of serious comorbidities.  The bleeding index is 'high risk'.  Positive CHADS2 values include History of CHF, History of HTN, and Prior Stroke/CVA/TIA.  Negative CHADS2 values include Age > 108 years old.  The start date was 07/09/2003.  Anticoagulation responsible provider: Jamine Highfill MD, Reuel Boom.  INR POC: 1.7.  Cuvette Lot#: [id number].  Exp: 05/2011.    Anticoagulation Management Assessment/Plan:      [name] current anticoagulation dose is Coumadin 5 mg tabs: Take as directed by coumadin clinic..  The target INR is 2 - 3.  The next INR is due 05/06/2010.  Anticoagulation instructions were given to patient.  Results were reviewed/authorized by Weston Brass, PharmD.  He was notified by Weston Brass PharmD.         Prior Anticoagulation Instructions: INR 2.8  Continue on same dosage 1 tablet daily except 1.5 tablets on Mondays and Wednesdays.  Recheck in 4 weeks.    Current Anticoagulation Instructions: INR 1.7  Take 2 tablets today then resume same dose of 1 tablet every day except 1 1/2 tablets on Monday and Wednesday

## 2011-01-21 NOTE — Medication Information (Signed)
Summary: rov/sp  Anticoagulant Therapy  Managed by: Weston Brass, PharmD Referring MD: [name] Supervising MD: Juanda Chance MD, Kathlene Yano Indication 1: CVA-stroke (ICD-436) Lab Used: [facility] Site: [facility] INR POC 2.6 INR RANGE 2 - 3  Dietary changes: no    Health status changes: no    Bleeding/hemorrhagic complications: no    Recent/future hospitalizations: no    Any changes in medication regimen? no    Recent/future dental: no  Any missed doses?: no       Is patient compliant with meds? yes       Allergies: 1)  Ace Inhibitors  Anticoagulation Management History:      The patient is taking warfarin and comes in today for a routine follow up visit.  Positive risk factors for bleeding include an age of 72 years or older, history of CVA/TIA, and presence of serious comorbidities.  The bleeding index is 'high risk'.  Positive CHADS2 values include History of CHF, History of HTN, and Prior Stroke/CVA/TIA.  Negative CHADS2 values include Age > 72 years old.  The start date was 07/09/2003.  Anticoagulation responsible provider: Juanda Chance MD, Smitty Cords.  INR POC: 2.6.  Cuvette Lot#: [id number].  Exp: 07/2011.    Anticoagulation Management Assessment/Plan:      [name] current anticoagulation dose is Coumadin 5 mg tabs: Take as directed by coumadin clinic..  The target INR is 2 - 3.  The next INR is due 06/17/2010.  Anticoagulation instructions were given to patient.  Results were reviewed/authorized by Weston Brass, PharmD.  He was notified by Weston Brass PharmD.         Prior Anticoagulation Instructions: INR 1.9  Increase dose to 1 tablet every day except 1 1/2 tablets on Monday, Wednesday and Friday   Current Anticoagulation Instructions: INR 2.6  Continue same dose of 1 tablet every day except 1 1/2 tablets on Monday, Wednesday and Friday

## 2011-01-21 NOTE — Assessment & Plan Note (Signed)
Summary: weight loss/nws   Vital Signs:  Patient profile:   70 year old male Height:      74.5 inches Weight:      232 pounds BMI:     29.49 Temp:     97.2 degrees F oral Pulse rate:   68 / minute Pulse rhythm:   regular Resp:     16 per minute BP sitting:   140 / 90  (left arm) Cuff size:   large  Vitals Entered By: Lanier Prude, Beverly Gust) (July 17, 2010 1:54 PM) CC: f/u Comments need clarification on meds Dr. Jonny Ruiz prescribed-(Metformin and B12 inj)   CC:  f/u.  History of Present Illness: The patient presents for a follow up of hypertension, diabetes, hyperlipidemia He  is here to discuss abn labs  Current Medications (verified): 1)  Labetalol Hcl 300 Mg  Tabs (Labetalol Hcl) .... Take 1 By Mouth Qd 2)  Lipitor 10 Mg  Tabs (Atorvastatin Calcium) .... Take 1 By Mouth Qd 3)  Coumadin 5 Mg Tabs (Warfarin Sodium) .... Take As Directed By Coumadin Clinic. 4)  Doxazosin Mesylate 1 Mg  Tabs (Doxazosin Mesylate) .... Take 1 By Mouth Qd 5)  Clonidine Hcl 0.1 Mg  Tabs (Clonidine Hcl) .... Take 1 By Mouth Two Times A Day Qd 6)  Allopurinol 300 Mg  Tabs (Allopurinol) .... Take 1 By Mouth Qd 7)  Furosemide 40 Mg Tabs (Furosemide) .[name].. 1by Mouth Once Daily 8)  Benicar 20 Mg  Tabs (Olmesartan Medoxomil) .... Once Daily 9)  Vicodin 5-500 Mg  Tabs (Hydrocodone-Acetaminophen) .[name].. 1 By Mouth Qid As Needed Pain 10)  Vitamin D3 1000 Unit  Tabs (Cholecalciferol) .[name].. 1 By Mouth Daily 11)  Potassium Chloride 20 Meq  Pack (Potassium Chloride) .[name].. 1 By Mouth Bid 12)  Nifedical Xl 60 Mg  Tb24 (Nifedipine) .... Take 2 By Mouth Qd 13)  Lotrisone 1-0.05 % Crea (Clotrimazole-Betamethasone) .... Use Asd Two Times A Day As Needed 14)  Mupirocin 2 % Oint (Mupirocin) .... Use Qd 15)  Triamcinolone Acetonide 0.5 % Crea (Triamcinolone Acetonide) .... Apply Bid To Affected Area 16)  Cyanocobalamin 1000 Mcg/ml Soln (Cyanocobalamin) .[name].. 1 Cc Im Q Month 17)  Metformin Hcl 500 Mg Xr24h-Tab (Metformin Hcl)  .[name].. 1po Qam  Allergies (verified): 1)  Ace Inhibitors  Past History:  Past Medical History: Last updated: 06/18/2010 Gout Hypertension Renal insufficiency Hyperlipidemia Osteoarthritis Cerebrovascular accident, hx of GERD DJD Congestive heart failure Benign prostatic hypertrophy Diabetes mellitus, type II  Past Surgical History: Last updated: 09/18/2007 Persantine Cardiolite (03/11/2000) Lower Arterial (04/28/2005)  Social History: Last updated: 04/22/2009 Retired - worked in Product manager Married Former Smoker Alcohol use-no  Review of Systems       The patient complains of weight gain, chest pain, and dyspnea on exertion.    Physical Exam  General:  alert and overweight-appearing.   Head:  normocephalic and atraumatic.   Ears:  R ear normal and L ear normal.   Nose:  no external deformity and no nasal discharge.   Mouth:  no gingival abnormalities and pharynx pink and moist.   Lungs:  normal respiratory effort and normal breath sounds.   Heart:  normal rate and regular rhythm.   Abdomen:  soft, non-tender, and normal bowel sounds.   Msk:  no joint tenderness and no joint swelling.   Extremities:  no edema, no erythema  Neurologic:  probable mild short term memory dysfunction Skin:  left medial distal leg 3 cm above ankle with 2 cm  nontender nonindurated/raised erythem area with central "bite" , no sweling, red streaks and no post left knee LA Psych:  not anxious appearing and not depressed appearing.     Impression & Recommendations:  Problem # 1:  VITAMIN B12 DEFICIENCY (ICD-266.2) Assessment New  Discussed. See "Patient Instructions".   Orders: Admin of Therapeutic Inj  intramuscular or subcutaneous (09811) Vit B12 1000 mcg (J3420)  Problem # 2:  DIABETES MELLITUS, TYPE II (ICD-250.00) Assessment: Deteriorated  His updated medication list for this problem includes:    Benicar 20 Mg Tabs (Olmesartan medoxomil) ..... Once daily    Metformin Hcl 500  Mg Xr24h-tab (Metformin hcl) .[name]... 1po qam  Problem # 3:  MEMORY LOSS (ICD-780.93) poss related to #1 Assessment: Deteriorated Treat #1  Problem # 4:  FATIGUE (ICD-780.79) poss related to #1 Assessment: Deteriorated  Problem # 5:  HYPERTENSION (ICD-401.9) Assessment: Improved  His updated medication list for this problem includes:    Labetalol Hcl 300 Mg Tabs (Labetalol hcl) .[name]... Take 1 by mouth qd    Doxazosin Mesylate 1 Mg Tabs (Doxazosin mesylate) .[name]... Take 1 by mouth qd    Clonidine Hcl 0.1 Mg Tabs (Clonidine hcl) .[name]... Take 1 by mouth two times a day qd    Furosemide 40 Mg Tabs (Furosemide) .Marland KitchenMarland KitchenMarland KitchenMarland Kitchen 1by mouth once daily    Benicar 20 Mg Tabs (Olmesartan medoxomil) ..... Once daily    Nifedical Xl 60 Mg Tb24 (Nifedipine) .[name]... Take 2 by mouth qd  BP today: 140/90 Prior BP: 140/88 (06/17/2010)  Labs Reviewed: K+: 3.3 (06/17/2010) Creat: : 1.4 (06/17/2010)   Chol: 257 (06/17/2010)   HDL: 42.50 (06/17/2010)   LDL: 87 (12/21/2006)   TG: 167.0 (06/17/2010)  Problem # 6:  GERD (ICD-530.81) Assessment: Unchanged  Complete Medication List: 1)  Labetalol Hcl 300 Mg Tabs (Labetalol hcl) .... Take 1 by mouth qd 2)  Coumadin 5 Mg Tabs (Warfarin sodium) .... Take as directed by coumadin clinic. 3)  Doxazosin Mesylate 1 Mg Tabs (Doxazosin mesylate) .... Take 1 by mouth qd 4)  Clonidine Hcl 0.1 Mg Tabs (Clonidine hcl) .... Take 1 by mouth two times a day qd 5)  Allopurinol 300 Mg Tabs (Allopurinol) .... Take 1 by mouth qd 6)  Furosemide 40 Mg Tabs (Furosemide) .[name].. 1by mouth once daily 7)  Benicar 20 Mg Tabs (Olmesartan medoxomil) .... Once daily 8)  Vicodin 5-500 Mg Tabs (Hydrocodone-acetaminophen) .[name].. 1 by mouth qid as needed pain 9)  Vitamin D3 1000 Unit Tabs (Cholecalciferol) .[name].. 1 by mouth daily 10)  Potassium Chloride 20 Meq Pack (Potassium chloride) .[name].. 1 by mouth bid 11)  Nifedical Xl 60 Mg Tb24 (Nifedipine) .... Take 2 by mouth qd 12)  Lotrisone 1-0.05 % Crea  (Clotrimazole-betamethasone) .... Use asd two times a day as needed 13)  Mupirocin 2 % Oint (Mupirocin) .... Use qd 14)  Triamcinolone Acetonide 0.5 % Crea (Triamcinolone acetonide) .... Apply bid to affected area 15)  Cyanocobalamin 1000 Mcg/ml Soln (Cyanocobalamin) .[name].. 1 cc subcutaneously once daily x 5 d, then q 1 wk x 1 months and then once q 2 wks 16)  Metformin Hcl 500 Mg Xr24h-tab (Metformin hcl) .[name].. 1po qam 17)  Lipitor 20 Mg Tabs (Atorvastatin calcium) .[name].. 1 by mouth once daily for cholesterol 18)  Bd Insulin Syringe Microfine 27g X 5/8" 1 Ml Misc (Insulin syringe-needle u-100) .... As dirrected  Patient Instructions: 1)  Please schedule a follow-up appointment in 3 months. 2)  BMP prior to visit, ICD-9: 3)  Hepatic  Panel prior to visit, ICD-9: 4)  Lipid Panel prior to visit, ICD-9: 5)  HbgA1C prior to visit, ICD-9: 6)  Vit B12 266.20 272.0 250.00 Prescriptions: BD INSULIN SYRINGE MICROFINE 27G X 5/8" 1 ML MISC (INSULIN SYRINGE-NEEDLE U-100) as dirrected  #20 x 12   Entered and Authorized by:   Tresa Garter MD   Signed by:   Tresa Garter MD on 07/17/2010   Method used:   Print then Give to Patient   RxID:   [id number] CYANOCOBALAMIN 1000 MCG/ML SOLN (CYANOCOBALAMIN) 1 cc subcutaneously once daily x 5 d, then q 1 wk x 1 months and then once q 2 wks  #10 ml x 11   Entered and Authorized by:   Tresa Garter MD   Signed by:   Tresa Garter MD on 07/17/2010   Method used:   Print then Give to Patient   RxID:   [id number] LIPITOR 20 MG TABS (ATORVASTATIN CALCIUM) 1 by mouth once daily for cholesterol  #30 x 12   Entered and Authorized by:   Tresa Garter MD   Signed by:   Tresa Garter MD on 07/17/2010   Method used:   Print then Give to Patient   RxID:   [id number]    Medication Administration  Injection # 1:    Medication: Vit B12 1000 mcg    Diagnosis: VITAMIN B12 DEFICIENCY (ICD-266.2)    Route: IM    Site: R  deltoid    Exp Date: 03/20/2012    Lot #: [id number]    Mfr: [employer]    Patient tolerated injection without complications    Given by: Lamar Sprinkles, CMA (July 20, 2010 10:40 AM)  Orders Added: 1)  Est. Patient Level IV [36644] 2)  Admin of Therapeutic Inj  intramuscular or subcutaneous [96372] 3)  Vit B12 1000 mcg [J3420]

## 2011-01-21 NOTE — Medication Information (Signed)
Summary: rov/sp   Anticoagulant Therapy  Managed by: Weston Brass, PharmD Referring MD: [name] Supervising MD: Patty Sermons Indication 1: CVA-stroke (ICD-436) Lab Used: [facility] Site: [facility] INR POC 2.1 INR RANGE 2 - 3  Dietary changes: no    Health status changes: no    Bleeding/hemorrhagic complications: no    Recent/future hospitalizations: no    Any changes in medication regimen? no    Recent/future dental: no  Any missed doses?: no       Is patient compliant with meds? yes       Allergies: 1)  Ace Inhibitors  Anticoagulation Management History:      The patient is taking warfarin and comes in today for a routine follow up visit.  Positive risk factors for bleeding include an age of 72 years or older, history of CVA/TIA, and presence of serious comorbidities.  The bleeding index is 'high risk'.  Positive CHADS2 values include History of CHF, History of HTN, History of Diabetes, and Prior Stroke/CVA/TIA.  Negative CHADS2 values include Age > 30 years old.  The start date was 07/09/2003.  Anticoagulation responsible provider: [facility].  INR POC: 2.1.  Cuvette Lot#: [id number].  Exp: 11/2011.    Anticoagulation Management Assessment/Plan:      [name] current anticoagulation dose is Coumadin 5 mg tabs: Take as directed by coumadin clinic..  The target INR is 2 - 3.  The next INR is due 11/19/2010.  Anticoagulation instructions were given to patient.  Results were reviewed/authorized by Weston Brass, PharmD.  He was notified by Hoy Register, PharmD Candidate.         Prior Anticoagulation Instructions: INR 2.5  Continue same dose of 1 tablet every day except 1 1/2 tablets on Monday and Friday.  Recheck INR in 4 weeks.   Current Anticoagulation Instructions: INR 2.1  Continue previous dose of 1 tablet daily except 1.5 tablets on Monday and Friday.  Recheck INR in 4 weeks.

## 2011-01-21 NOTE — Progress Notes (Signed)
Summary: Vicodin  Phone Note Refill Request Message from:  Fax from Pharmacy on February 09, 2010 2:54 PM  Refills Requested: Medication #1:  VICODIN 5-500 MG  TABS 1 by mouth qid as needed pain Next Appointment Scheduled: none Initial call taken by: Lucious Groves,  February 09, 2010 2:54 PM  Follow-up for Phone Call        ok 2 ref Follow-up by: Tresa Garter MD,  February 10, 2010 7:45 AM    Prescriptions: VICODIN 5-500 MG  TABS (HYDROCODONE-ACETAMINOPHEN) 1 by mouth qid as needed pain  #90 x 2   Entered by:   Lucious Groves   Authorized by:   Tresa Garter MD   Signed by:   Lucious Groves on 02/10/2010   Method used:   Telephoned to ...       [facility]  [phone]* (retail)       [address]       [place], [place]  [id number]       Ph: [phone] or [phone]       Fax: [phone]   RxID:   [id number]
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
## 2011-01-21 NOTE — Medication Information (Signed)
Summary: overdue for follow-up/ewj  Anticoagulant Therapy  Managed by: Bethena Midget, RN, BSN Referring MD: [name] Supervising MD: Riley Kill MD, Maisie Fus Indication 1: CVA-stroke (ICD-436) Lab Used: [facility] Site: [facility] INR POC 3.3 INR RANGE 2 - 3  Dietary changes: yes       Details: Ate less green leafy veggies  Health status changes: no    Bleeding/hemorrhagic complications: no    Recent/future hospitalizations: no    Any changes in medication regimen? no    Recent/future dental: no  Any missed doses?: no       Is patient compliant with meds? yes       Allergies: 1)  Ace Inhibitors  Anticoagulation Management History:      The patient is taking warfarin and comes in today for a routine follow up visit.  Positive risk factors for bleeding include an age of 72 years or older, history of CVA/TIA, and presence of serious comorbidities.  The bleeding index is 'high risk'.  Positive CHADS2 values include History of CHF, History of HTN, History of Diabetes, and Prior Stroke/CVA/TIA.  Negative CHADS2 values include Age > 44 years old.  The start date was 07/09/2003.  Anticoagulation responsible provider: Riley Kill MD, Maisie Fus.  INR POC: 3.3.  Cuvette Lot#: [id number].  Exp: 09/2011.    Anticoagulation Management Assessment/Plan:      [name] current anticoagulation dose is Coumadin 5 mg tabs: Take as directed by coumadin clinic..  The target INR is 2 - 3.  The next INR is due 08/03/2010.  Anticoagulation instructions were given to patient.  Results were reviewed/authorized by Bethena Midget, RN, BSN.  He was notified by Bethena Midget, RN, BSN.         Prior Anticoagulation Instructions: INR 2.6  Continue same dose of 1 tablet every day except 1 1/2 tablets on Monday, Wednesday and Friday   Current Anticoagulation Instructions: INR 3.3 Today only take 1/2 pill then resume 1 pill everyday except 1 1/2 pills on Mondays, Wednesdays and Fridays. Recheck in 3 weeks.

## 2011-01-21 NOTE — Medication Information (Signed)
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 no    Recent/future dental: no  Any missed doses?: yes     Details: in january having some teeth pulled  Is patient compliant with meds? yes       Allergies: 1)  Ace Inhibitors  Anticoagulation Management History:      The patient is taking warfarin and comes in today for a routine follow up visit.  Positive risk factors for bleeding include an age of 72 years or older, history of CVA/TIA, and presence of serious comorbidities.  The bleeding index is 'high risk'.  Positive CHADS2 values include History of CHF, History of HTN, History of Diabetes, and Prior Stroke/CVA/TIA.  Negative CHADS2 values include Age > 72 years old.  The start date was 07/09/2003.  Anticoagulation responsible Emmilia Sowder: Eden Emms MD, Theron Arista.  INR POC: 2.7.  Cuvette Lot#: [id number].  Exp: 11/2011.    Anticoagulation Management Assessment/Plan:      [name] current anticoagulation dose is Coumadin 5 mg tabs: Take as directed by coumadin clinic..  The target INR is 2 - 3.  The next INR is due 12/17/2010.  Anticoagulation instructions were given to patient.  Results were reviewed/authorized by Lyna Poser, PharmD.         Prior Anticoagulation Instructions: INR 2.1  Continue previous dose of 1 tablet daily except 1.5 tablets on Monday and Friday.  Recheck INR in 4 weeks.   Current Anticoagulation Instructions: INR 2.7 Continue taking 1.5 tablets on monday and friday. And 1 tablet all other days. Recheck in 4 weeks.

## 2011-02-09 DIAGNOSIS — I6350 Cerebral infarction due to unspecified occlusion or stenosis of unspecified cerebral artery: Secondary | ICD-10-CM

## 2011-02-11 ENCOUNTER — Encounter (INDEPENDENT_AMBULATORY_CARE_PROVIDER_SITE_OTHER): Payer: Medicare Other

## 2011-02-11 ENCOUNTER — Encounter: Payer: Self-pay | Admitting: Internal Medicine

## 2011-02-11 DIAGNOSIS — Z7901 Long term (current) use of anticoagulants: Secondary | ICD-10-CM

## 2011-02-11 DIAGNOSIS — I6789 Other cerebrovascular disease: Secondary | ICD-10-CM

## 2011-02-16 NOTE — Medication Information (Signed)
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 no    Recent/future dental: no  Any missed doses?: no       Is patient compliant with meds? yes       Current Medications (verified): 1)  Labetalol Hcl 100 Mg Tabs (Labetalol Hcl) .[name].. 1 By Mouth Three Times A Day 2)  Coumadin 5 Mg Tabs (Warfarin Sodium) .... Take As Directed By Coumadin Clinic. 3)  Doxazosin Mesylate 1 Mg  Tabs (Doxazosin Mesylate) .... Take 1 By Mouth Qd 4)  Clonidine Hcl 0.1 Mg  Tabs (Clonidine Hcl) .... Take 1 By Mouth Two Times A Day Qd 5)  Allopurinol 300 Mg  Tabs (Allopurinol) .... Take 1 By Mouth Qd 6)  Furosemide 40 Mg Tabs (Furosemide) .[name].. 1by Mouth Once Daily 7)  Benicar 20 Mg  Tabs (Olmesartan Medoxomil) .... Once Daily 8)  Vicodin 5-500 Mg  Tabs (Hydrocodone-Acetaminophen) .[name].. 1 By Mouth Qid As Needed Pain 9)  Vitamin D3 1000 Unit  Tabs (Cholecalciferol) .[name].. 1 By Mouth Daily 10)  Potassium Chloride 20 Meq  Pack (Potassium Chloride) .[name].. 1 By Mouth Bid 11)  Nifedical Xl 60 Mg  Tb24 (Nifedipine) .... Take 2 By Mouth Qd 12)  Lotrisone 1-0.05 % Crea (Clotrimazole-Betamethasone) .... Use Asd Two Times A Day As Needed 13)  Mupirocin 2 % Oint (Mupirocin) .... Use Qd 14)  Triamcinolone Acetonide 0.5 % Crea (Triamcinolone Acetonide) .... Apply Bid To Affected Area 15)  Cyanocobalamin 1000 Mcg/ml Soln (Cyanocobalamin) .[name].. 1 Cc Subcutaneously Once Daily X 5 D, Then Q 1 Wk X 1 Months and Then Once Q 2 Wks 16)  Metformin Hcl 500 Mg Xr24h-Tab (Metformin Hcl) .[name].. 1po Qam 17)  Lipitor 20 Mg Tabs (Atorvastatin Calcium) .[name].. 1 By Mouth Once Daily For  Cholesterol 18)  Bd Insulin Syringe Microfine 27g X 5/8" 1 Ml Misc (Insulin Syringe-Needle U-100) .... As Dirrected  Allergies (verified): 1)  Ace Inhibitors  Anticoagulation Management History:      The patient is taking warfarin and comes in today for a routine follow up visit.  Positive risk factors for bleeding include an age of 33 years or older, history of CVA/TIA, and presence of serious comorbidities.  The bleeding index is 'high risk'.  Positive CHADS2 values include History of CHF, History of HTN, History of Diabetes, and Prior Stroke/CVA/TIA.  Negative CHADS2 values include Age > 37 years old.  The start date was 07/09/2003.  Anticoagulation responsible provider: Ladona Ridgel MD, Sharlot Gowda.  INR POC: 1.9.  Cuvette Lot#: [id number].  Exp: 12/2011.    Anticoagulation Management Assessment/Plan:      [name] current anticoagulation dose is Coumadin 5 mg tabs: Take as directed by coumadin clinic..  The target INR is 2 - 3.  The next INR is due 03/04/2011.  Anticoagulation instructions were given to patient.  Results were reviewed/authorized by Eda Keys, PharmD.  He was notified by Eda Keys.         Prior Anticoagulation Instructions: INR 1.9 (goal INR: 2-3)  Take an extra half tablet today (Tuesday).  Resume normal schedule on Wednesday of 1 tablet everyday except 1 and 1/2 tablets  on Mondays and Fridays.  Recheck in 4 weeks.   Current Anticoagulation Instructions: INR 1.9  Take an extra 1/2 tablet today, then start NEW dosing schedule of 1.5 tablets on Monday, Wednesday, and Friday, and 1 tablet all other days.  Return to clinic in 3 weeks.

## 2011-03-04 ENCOUNTER — Encounter: Payer: Self-pay | Admitting: Internal Medicine

## 2011-03-04 ENCOUNTER — Encounter (INDEPENDENT_AMBULATORY_CARE_PROVIDER_SITE_OTHER): Payer: Medicare Other

## 2011-03-04 DIAGNOSIS — I6789 Other cerebrovascular disease: Secondary | ICD-10-CM

## 2011-03-04 DIAGNOSIS — Z7901 Long term (current) use of anticoagulants: Secondary | ICD-10-CM

## 2011-03-04 LAB — CONVERTED CEMR LAB: POC INR: 1.7

## 2011-03-11 ENCOUNTER — Other Ambulatory Visit: Payer: Self-pay | Admitting: Internal Medicine

## 2011-03-18 NOTE — Medication Information (Signed)
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  Health status changes: no    Bleeding/hemorrhagic complications: no    Recent/future hospitalizations: no    Any changes in medication regimen? no    Recent/future dental: no  Any missed doses?: yes     Details: Pt thinks he missed 1 dosage 3-4 days ago.    Is patient compliant with meds? yes       Allergies: 1)  Ace Inhibitors  Anticoagulation Management History:      The patient is taking warfarin and comes in today for a routine follow up visit.  Positive risk factors for bleeding include an age of 72 years or older, history of CVA/TIA, and presence of serious comorbidities.  The bleeding index is 'high risk'.  Positive CHADS2 values include History of CHF, History of HTN, History of Diabetes, and Prior Stroke/CVA/TIA.  Negative CHADS2 values include Age > 85 years old.  The start date was 07/09/2003.  Anticoagulation responsible provider: Galit Urich MD, Fayrene Fearing.  INR POC: 1.7.  Cuvette Lot#: [id number].  Exp: 02/2012.    Anticoagulation Management Assessment/Plan:      [name] current anticoagulation dose is Coumadin 5 mg tabs: Take as directed by coumadin clinic..  The target INR is 2 - 3.  The next INR is due 03/25/2011.  Anticoagulation instructions were given to patient.  Results were reviewed/authorized by Cloyde Reams, RN, BSN.  He was notified by Cloyde Reams RN.         Prior Anticoagulation Instructions: INR 1.9  Take an extra 1/2 tablet today, then start NEW dosing schedule of 1.5 tablets on Monday, Wednesday, and Friday, and 1 tablet all other days.  Return to clinic in 3 weeks.   Current Anticoagulation Instructions: INR 1.7  Take 2 tablets today, then resume  same dosage 1 tablet daily except 1.5 tablets on Mondays, Wednesdays, and Fridays.  Recheck in 3 weeks.

## 2011-03-25 ENCOUNTER — Ambulatory Visit (INDEPENDENT_AMBULATORY_CARE_PROVIDER_SITE_OTHER): Payer: Medicare Other | Admitting: *Deleted

## 2011-03-25 DIAGNOSIS — I6350 Cerebral infarction due to unspecified occlusion or stenosis of unspecified cerebral artery: Secondary | ICD-10-CM

## 2011-03-25 DIAGNOSIS — Z7901 Long term (current) use of anticoagulants: Secondary | ICD-10-CM

## 2011-03-25 LAB — POCT INR: INR: 1.9

## 2011-03-25 NOTE — Patient Instructions (Signed)
Start taking 1.5 tablets daily except 1 tablet on Sundays, Tuesdays, and Thursdays.  Recheck in 3 weeks

## 2011-04-13 ENCOUNTER — Other Ambulatory Visit (INDEPENDENT_AMBULATORY_CARE_PROVIDER_SITE_OTHER): Payer: Medicare Other

## 2011-04-13 ENCOUNTER — Ambulatory Visit (INDEPENDENT_AMBULATORY_CARE_PROVIDER_SITE_OTHER)
Admission: RE | Admit: 2011-04-13 | Discharge: 2011-04-13 | Disposition: A | Discharge status: Home / Self Care | Payer: Medicare Other | Source: Ambulatory Visit | Attending: Internal Medicine | Admitting: Internal Medicine

## 2011-04-13 ENCOUNTER — Encounter: Payer: Self-pay | Admitting: Internal Medicine

## 2011-04-13 ENCOUNTER — Ambulatory Visit (INDEPENDENT_AMBULATORY_CARE_PROVIDER_SITE_OTHER): Payer: Medicare Other | Admitting: Internal Medicine

## 2011-04-13 VITALS — BP 132/90 | HR 72 | Temp 98.2°F | Resp 16 | Ht 74.5 in | Wt 227.0 lb

## 2011-04-13 DIAGNOSIS — R0609 Other forms of dyspnea: Secondary | ICD-10-CM

## 2011-04-13 DIAGNOSIS — E538 Deficiency of other specified B group vitamins: Secondary | ICD-10-CM

## 2011-04-13 DIAGNOSIS — R0989 Other specified symptoms and signs involving the circulatory and respiratory systems: Secondary | ICD-10-CM

## 2011-04-13 DIAGNOSIS — Z79899 Other long term (current) drug therapy: Secondary | ICD-10-CM

## 2011-04-13 DIAGNOSIS — R0600 Dyspnea, unspecified: Secondary | ICD-10-CM

## 2011-04-13 DIAGNOSIS — I447 Left bundle-branch block, unspecified: Secondary | ICD-10-CM | POA: Insufficient documentation

## 2011-04-13 LAB — COMPREHENSIVE METABOLIC PANEL
AST: 16 U/L (ref 0–37)
Alkaline Phosphatase: 92 U/L (ref 39–117)
BUN: 13 mg/dL (ref 6–23)
Calcium: 9 mg/dL (ref 8.4–10.5)
Creatinine, Ser: 1.3 mg/dL (ref 0.4–1.5)

## 2011-04-13 LAB — CBC WITH DIFFERENTIAL/PLATELET
Basophils Relative: 0.5 % (ref 0.0–3.0)
Eosinophils Absolute: 0.2 10*3/uL (ref 0.0–0.7)
Hemoglobin: 11.7 g/dL — ABNORMAL LOW (ref 13.0–17.0)
MCHC: 33 g/dL (ref 30.0–36.0)
MCV: 81.6 fl (ref 78.0–100.0)
Monocytes Absolute: 0.5 10*3/uL (ref 0.1–1.0)
Neutro Abs: 8.1 10*3/uL — ABNORMAL HIGH (ref 1.4–7.7)
RBC: 4.35 Mil/uL (ref 4.22–5.81)

## 2011-04-13 LAB — SEDIMENTATION RATE: Sed Rate: 30 mm/hr — ABNORMAL HIGH (ref 0–22)

## 2011-04-13 MED ORDER — BUDESONIDE-FORMOTEROL FUMARATE 160-4.5 MCG/ACT IN AERO: 2.0000 | INHALATION_SPRAY | Freq: Two times a day (BID) | RESPIRATORY_TRACT | Status: DC

## 2011-04-13 MED ORDER — ASPIRIN 81 MG PO CHEW: 81.0000 mg | CHEWABLE_TABLET | Freq: Every day | ORAL | Status: DC

## 2011-04-13 NOTE — Patient Instructions (Signed)
To ER if worse

## 2011-04-13 NOTE — Assessment & Plan Note (Signed)
Card cons 

## 2011-04-13 NOTE — Progress Notes (Signed)
  Subjective:    Patient ID: Frank Mclean, male    DOB: 24-Sep-1941, 70 y.o.   MRN: [id number]  HPI  Last Fri he woke up wheezing and SOB. On Sat wife noticed he was SOB - wife gave him albuterol and it helped a lot, then came back on Sun and Mon. No fever.  Review of Systems  Constitutional: Positive for activity change and fatigue. Negative for fever and chills.  HENT: Negative for nosebleeds and congestion.   Eyes: Negative for visual disturbance.  Respiratory: Positive for shortness of breath and wheezing. Negative for cough, chest tightness and stridor.   Gastrointestinal: Negative for abdominal distention.  Musculoskeletal: Negative for myalgias and back pain.  Neurological: Negative for syncope, weakness and headaches.  Psychiatric/Behavioral: The patient is not nervous/anxious.        Objective:   Physical Exam  Constitutional: He is oriented to person, place, and time. He appears well-developed.       NAD  HENT:  Mouth/Throat: Oropharynx is clear and moist.  Eyes: Conjunctivae are normal. Pupils are equal, round, and reactive to light.  Neck: Normal range of motion. No JVD present. No thyromegaly present.  Cardiovascular: Normal rate, regular rhythm, normal heart sounds and intact distal pulses.  Exam reveals no gallop and no friction rub.   No murmur heard. Pulmonary/Chest: Effort normal and breath sounds normal. No respiratory distress. He has no wheezes. He has no rales. He exhibits no tenderness.  Abdominal: Soft. Bowel sounds are normal. He exhibits no distension and no mass. There is no tenderness. There is no rebound and no guarding.  Musculoskeletal: Normal range of motion. He exhibits edema (trace B) and tenderness (calves NT B).  Lymphadenopathy:    He has no cervical adenopathy.  Neurological: He is alert and oriented to person, place, and time. He has normal reflexes. No cranial nerve deficit. He exhibits normal muscle tone. Coordination normal.  Skin: Skin is  warm and dry. No rash noted.  Psychiatric: He has a normal mood and affect. His behavior is normal. Judgment and thought content normal.          Assessment & Plan:   Dyspnea New - likely COPD R/o CHF, CAD, PE EKG w/LBBB Will get a CXR Card cons  LBBB (left bundle branch block) Card cons  VITAMIN B12 DEFICIENCY On Rx    It is a complex case. R/o CHF, COPD. See meds and orders. To ER if worse.

## 2011-04-13 NOTE — Assessment & Plan Note (Signed)
On Rx 

## 2011-04-13 NOTE — Assessment & Plan Note (Signed)
New - likely COPD R/o CHF, CAD, PE EKG w/LBBB Will get a CXR Card cons

## 2011-04-14 ENCOUNTER — Inpatient Hospital Stay (HOSPITAL_COMMUNITY)
Admission: EM | Admit: 2011-04-14 | Discharge: 2011-04-16 | Disposition: A | Discharge status: Home Health | Payer: Medicare Other | Source: Home / Self Care | Attending: Internal Medicine | Admitting: Internal Medicine

## 2011-04-14 ENCOUNTER — Telehealth: Payer: Self-pay | Admitting: *Deleted

## 2011-04-14 ENCOUNTER — Emergency Department (HOSPITAL_COMMUNITY): Payer: Medicare Other

## 2011-04-14 DIAGNOSIS — Z01812 Encounter for preprocedural laboratory examination: Secondary | ICD-10-CM

## 2011-04-14 DIAGNOSIS — Z8249 Family history of ischemic heart disease and other diseases of the circulatory system: Secondary | ICD-10-CM

## 2011-04-14 DIAGNOSIS — I43 Cardiomyopathy in diseases classified elsewhere: Secondary | ICD-10-CM | POA: Diagnosis present

## 2011-04-14 DIAGNOSIS — I509 Heart failure, unspecified: Secondary | ICD-10-CM

## 2011-04-14 DIAGNOSIS — I5021 Acute systolic (congestive) heart failure: Principal | ICD-10-CM | POA: Diagnosis present

## 2011-04-14 DIAGNOSIS — I2510 Atherosclerotic heart disease of native coronary artery without angina pectoris: Secondary | ICD-10-CM | POA: Diagnosis present

## 2011-04-14 DIAGNOSIS — Z7982 Long term (current) use of aspirin: Secondary | ICD-10-CM

## 2011-04-14 DIAGNOSIS — I130 Hypertensive heart and chronic kidney disease with heart failure and stage 1 through stage 4 chronic kidney disease, or unspecified chronic kidney disease: Secondary | ICD-10-CM | POA: Diagnosis present

## 2011-04-14 DIAGNOSIS — D649 Anemia, unspecified: Secondary | ICD-10-CM | POA: Diagnosis present

## 2011-04-14 DIAGNOSIS — E119 Type 2 diabetes mellitus without complications: Secondary | ICD-10-CM | POA: Diagnosis present

## 2011-04-14 DIAGNOSIS — E785 Hyperlipidemia, unspecified: Secondary | ICD-10-CM | POA: Diagnosis present

## 2011-04-14 DIAGNOSIS — Z8673 Personal history of transient ischemic attack (TIA), and cerebral infarction without residual deficits: Secondary | ICD-10-CM

## 2011-04-14 DIAGNOSIS — Z23 Encounter for immunization: Secondary | ICD-10-CM

## 2011-04-14 DIAGNOSIS — N183 Chronic kidney disease, stage 3 unspecified: Secondary | ICD-10-CM | POA: Diagnosis present

## 2011-04-14 DIAGNOSIS — G7289 Other specified myopathies: Secondary | ICD-10-CM | POA: Diagnosis present

## 2011-04-14 DIAGNOSIS — G609 Hereditary and idiopathic neuropathy, unspecified: Secondary | ICD-10-CM | POA: Diagnosis present

## 2011-04-14 DIAGNOSIS — Z79899 Other long term (current) drug therapy: Secondary | ICD-10-CM

## 2011-04-14 DIAGNOSIS — Z7901 Long term (current) use of anticoagulants: Secondary | ICD-10-CM

## 2011-04-14 DIAGNOSIS — M109 Gout, unspecified: Secondary | ICD-10-CM | POA: Diagnosis present

## 2011-04-14 DIAGNOSIS — E876 Hypokalemia: Secondary | ICD-10-CM | POA: Diagnosis present

## 2011-04-14 DIAGNOSIS — N289 Disorder of kidney and ureter, unspecified: Secondary | ICD-10-CM | POA: Diagnosis not present

## 2011-04-14 LAB — BASIC METABOLIC PANEL
BUN: 14 mg/dL (ref 6–23)
CO2: 27 mEq/L (ref 19–32)
Calcium: 8.8 mg/dL (ref 8.4–10.5)
Creatinine, Ser: 1.39 mg/dL (ref 0.4–1.5)
GFR calc Af Amer: >60 mL/min (ref >60)

## 2011-04-14 LAB — VITAMIN B12: Vitamin B-12: 644 pg/mL (ref 211–911)

## 2011-04-14 LAB — DIFFERENTIAL
Basophils Absolute: 0 10*3/uL (ref 0.0–0.1)
Eosinophils Relative: 1 % (ref 0–5)
Lymphocytes Relative: 8 % — ABNORMAL LOW (ref 12–46)
Monocytes Absolute: 0.5 10*3/uL (ref 0.1–1.0)

## 2011-04-14 LAB — CBC
HCT: 34.9 % — ABNORMAL LOW (ref 39.0–52.0)
MCH: 25.7 pg — ABNORMAL LOW (ref 26.0–34.0)
MCHC: 32.7 g/dL (ref 30.0–36.0)
MCV: 78.6 fL (ref 78.0–100.0)
RDW: 14.3 % (ref 11.5–15.5)

## 2011-04-14 LAB — COMPREHENSIVE METABOLIC PANEL
Albumin: 3.2 g/dL — ABNORMAL LOW (ref 3.5–5.2)
BUN: 13 mg/dL (ref 6–23)
Calcium: 8.6 mg/dL (ref 8.4–10.5)
GFR calc Af Amer: 58 mL/min — ABNORMAL LOW (ref >60)
Glucose, Bld: 150 mg/dL — ABNORMAL HIGH (ref 70–99)
Total Protein: 5.6 g/dL — ABNORMAL LOW (ref 6.0–8.3)

## 2011-04-14 LAB — APTT: aPTT: 59 seconds — ABNORMAL HIGH (ref 24–37)

## 2011-04-14 LAB — PROTIME-INR: Prothrombin Time: 27.1 seconds — ABNORMAL HIGH (ref 11.6–15.2)

## 2011-04-14 LAB — FIBRINOGEN: Fibrinogen: 564 mg/dL — ABNORMAL HIGH (ref 204–475)

## 2011-04-14 LAB — CARDIAC PANEL(CRET KIN+CKTOT+MB+TROPI): Troponin I: 0.05 ng/mL (ref 0.00–0.06)

## 2011-04-14 LAB — POCT CARDIAC MARKERS: Troponin i, poc: <0.05 ng/mL (ref 0.00–0.09)

## 2011-04-14 LAB — BRAIN NATRIURETIC PEPTIDE: Pro B Natriuretic peptide (BNP): 559 pg/mL — ABNORMAL HIGH (ref 0.0–100.0)

## 2011-04-14 LAB — MAGNESIUM: Magnesium: 1.4 mg/dL — ABNORMAL LOW (ref 1.5–2.5)

## 2011-04-14 NOTE — Telephone Encounter (Signed)
Wife called this am - pt has apt w/Cardiology on Friday with Dr Jens Som. She states that pt had very bad night and SOB is much worse this am. Per MD - ER now, wife agreed

## 2011-04-14 NOTE — H&P (Signed)
[name], Mclean NO.:  [id number]  MEDICAL RECORD NO.:  [id number]           PATIENT TYPE:  E  LOCATION:  WLED                         FACILITY:  [facility]  PHYSICIAN:  Conley Canal, MD      DATE OF BIRTH:  [birth year]  DATE OF ADMISSION:  04/14/2011 DATE OF DISCHARGE:                             HISTORY & PHYSICAL   PRIMARY CARE PHYSICIAN:  Georgina Quint. Plotnikov, MD.  CHIEF COMPLAINT:  Shortness of breath x5 days.  HISTORY OF PRESENT ILLNESS:  [name] is a very pleasant 70 year old male with history of hypertension, history of CVAs in the past, which he says was secondary to uncontrolled hypertension, and he is on Coumadin, which he thinks is for the stroke, also history of diabetes mellitus apparently type 2 on metformin, but the patient says up to know he did not know he was diabetic, also hyperlipidemia, history of congestive heart failure per records, his last EF was 50-55% in June 2004, who is coming in now with complaints of shortness of breath, which started about 5 days ago, apparently at rest.  This was associated with orthopnea and PND.  The patient states that he was using one pillow for sleep until 6 months ago when he started using 2 pillows as this was more comfortable.  He denies any chest pain or cough associated with the shortness of breath, but he has noted some leg swelling, right more than left.  He denies any fever.  He has been taking Lasix, which he says is for hypertension, but he says since he did not know about the history of congestive heart failure, he had been drinking a lot of water prior to coming into the hospital.  He has a salt-free diet and there have not been major changes in his medications recently.  In fact, the patient was seen by his primary care provider recently and he was supposed to see [facility] on Friday this week, but because of the worsening shortness of breath, he ended up in the emergency room.   Upon arrival to the emergency room, the patient was found to have blood pressure in the 160 systolic and to have a BNP of more than 500 and chest x-ray suggested stable cardiac enlargement and probable mild interstitial edema.  He received Lasix 40 mg p.o. as well as potassium 40 mEq p.o. and was referred to the hospitalist service for further management. Otherwise, he denies any other complaints.  No urinary symptoms other than urinary frequency and no diarrhea.  No abdominal pains.  PAST MEDICAL HISTORY: 1. Hypertension.  Apparently, poorly controlled. 2. Diabetes mellitus type 2. 3. Hyperlipidemia. 4. History of CVAs. 5. Long-term anticoagulation.  Reason for anticoagulation, not clear.  HOME MEDICATIONS:  Include aspirin, Lasix, doxazosin, Lipitor, warfarin, Klor-Con, clonidine, metformin, labetalol.  ALLERGIES:  No known drug allergies.  FAMILY HISTORY:  His mother diabetic, also has history of congestive heart failure.  REVIEW OF SYSTEMS:  Unremarkable except as highlighted in the history of present illness.  SOCIAL HISTORY:  Patient married.  Denies cigarette smoking, alcohol, or illicit drugs.  PHYSICAL  EXAMINATION:  GENERAL:  This is a well-looking elderly male, who is not in acute distress. VITALS:  Blood pressure 160 systolic, heart rate is in the 70s.  He is afebrile, oxygenating adequately, respiratory rate is 16. HEENT:  Pupils equal reacting to light.  No jugular venous distention. No carotid bruits. RESPIRATORY SYSTEM:  Good air entry bilaterally with no rhonchi, rales, or wheezes. CARDIOVASCULAR SYSTEM:  First and second heart sounds heard.  Gallop rhythm.  No murmurs. ABDOMEN:  Obese, otherwise soft and nontender.  No palpable organomegaly.  Bowel sounds are normal. CNS:  The patient is alert and oriented to person, place, and time.  No acute focal neurological deficits. EXTREMITIES:  Some pitting pedal edema 1 to 2+ right leg and 1+ left leg.   Peripheral pulses equal.  LABORATORY DATA:  Labs were reviewed significant for WBC 10.5, hemoglobin 11.4, hematocrit 34.9, MCV 78.6, platelet count 241.  Sodium is 139, potassium 3.2, BUN 14, creatinine 1.39, glucose 209, calcium 8.8.  BNP 559.  PT 27.1, INR 2.50.  Point-of-care cardiac enzymes negative x1.  Vitamin B12, TSH level normal on April 13, 2011.  EKG shows normal sinus rhythm with right bundle-branch block, left axis deviation and left ventricular hypertrophy with repolarization.  Chest x- ray shows stable cardiac enlargement and probable mild interstitial edema.  IMPRESSION:  A 70 year old male, diabetic, hypertensive, apparently history of congestive heart failure, seems diastolic with EF 04-54% in June 2004, who is coming in with acute decompensation of congestive heart failure.  The concern at this point would be for a possible silent coronary event given his history of diabetes mellitus, or just element of him taking a lot of fluids per his own admission, as he insists that he did not know he had history of congestive heart failure.  He also has poorly controlled diabetes mellitus type 2. According to him and his wife this is the first time he gets to know he is diabetic.  The patient is on Coumadin after CVAs.  I am not sure if he has history of paroxysmal atrial fibrillation or other arrhythmias or clotting tendencies, but he has been on Coumadin for a while now.  PLAN: 1. Acute decompensation of congestive heart failure.  We will admit     the patient to telemetry and we will place him on Lasix IV.  Start     him on lisinopril.  Continue aspirin.  Continue labetalol.  Obtain     2-D echocardiogram to assess his EF and for any wall motion     abnormalities, monitor on telemetry for arrhythmias, obtain serial cardiac enzymes. Will also consult Cardiology to look into     possible ischemic component.  We will monitor his weight daily,     limit his fluid intake to 1500  mL per day.  Monitor input and     output.  Ask for dietitian to help.  He will need CHF education     prior to discharge. 2. Hypertension.  This is poorly controlled.  The patient has been on     labetalol, Lasix, doxazosin, which will be continued as well as     clonidine.  Apparently, he was taking clonidine 0.3 mg as needed.     According to his wife, he was taking it one to three times a day.     This may have rebound effect, so I have changed the clonidine to     0.2 mg three times a day if his blood  pressure is allowing as the BP     needs better control. 3. Diabetes mellitus type 2.  This seems a mystery to the patient and     his wife, but he has been on metformin.  We will check     hemoglobin A1c, continue the metformin, place him on sliding scale     insulin and low-dose Lantus and hopefully have dietitian help with     nutrition education.  He will need to learn to check his     fingersticks at home. 4. Hyperlipidemia.  We will continue Lipitor. 5. Long-term anticoagulation.  He is adequately anticoagulated.  We     will continue Coumadin as he was taking previously for target INR 2     to 3. 6. Hypokalemia, most likely related to diuretics.  We will continue to     supplement potassium as needed. 7. Normocytic anemia.  There seems to be microcytic element as his MCV     is 78 today.  Would worry about slow GI blood loss.  We will hence     obtain stool Hemoccult, consider GI if in fact stool Hemoccult     positive. He will be on PPI. 8. Patient's condition is closely guarded.     Conley Canal, MD     SR/MEDQ  D:  04/14/2011  T:  04/14/2011  Job:  [id number]  cc:   Georgina Quint. Plotnikov, MD [address] [place] [address]  Electronically Signed by Conley Canal  on 04/14/2011 04:31:31 PM

## 2011-04-15 ENCOUNTER — Telehealth: Payer: Self-pay | Admitting: Internal Medicine

## 2011-04-15 ENCOUNTER — Encounter: Payer: Medicare Other | Admitting: *Deleted

## 2011-04-15 DIAGNOSIS — I509 Heart failure, unspecified: Secondary | ICD-10-CM

## 2011-04-15 DIAGNOSIS — I517 Cardiomegaly: Secondary | ICD-10-CM

## 2011-04-15 LAB — CARDIAC PANEL(CRET KIN+CKTOT+MB+TROPI)
CK, MB: 2.2 ng/mL (ref 0.3–4.0)
CK, MB: 2.1 ng/mL (ref 0.3–4.0)
Relative Index: INVALID (ref 0.0–2.5)
Total CK: 87 U/L (ref 7–232)
Total CK: 84 U/L (ref 7–232)
Troponin I: 0.06 ng/mL (ref 0.00–0.06)

## 2011-04-15 LAB — CBC
Hemoglobin: 10.8 g/dL — ABNORMAL LOW (ref 13.0–17.0)
RBC: 4.21 MIL/uL — ABNORMAL LOW (ref 4.22–5.81)

## 2011-04-15 LAB — PROTIME-INR: INR: 2.76 — ABNORMAL HIGH (ref 0.00–1.49)

## 2011-04-15 LAB — TSH: TSH: 2.227 u[IU]/mL (ref 0.350–4.500)

## 2011-04-15 LAB — LIPID PANEL
Cholesterol: 125 mg/dL (ref 0–200)
Total CHOL/HDL Ratio: 3.3 RATIO

## 2011-04-15 LAB — HEMOGLOBIN A1C: Hgb A1c MFr Bld: 7.4 % — ABNORMAL HIGH (ref <5.7)

## 2011-04-15 LAB — PROTEIN ELECTROPHORESIS, SERUM
Alpha-2-Globulin: 14.5 % — ABNORMAL HIGH (ref 7.1–11.8)
Gamma Globulin: 9.3 % — ABNORMAL LOW (ref 11.1–18.8)

## 2011-04-15 LAB — COMPREHENSIVE METABOLIC PANEL
AST: 13 U/L (ref 0–37)
Albumin: 3.2 g/dL — ABNORMAL LOW (ref 3.5–5.2)
Calcium: 8.6 mg/dL (ref 8.4–10.5)
Creatinine, Ser: 1.54 mg/dL — ABNORMAL HIGH (ref 0.4–1.5)
GFR calc Af Amer: 54 mL/min — ABNORMAL LOW (ref >60)
GFR calc non Af Amer: 45 mL/min — ABNORMAL LOW (ref >60)
Total Protein: 5.8 g/dL — ABNORMAL LOW (ref 6.0–8.3)

## 2011-04-15 LAB — BASIC METABOLIC PANEL
GFR calc non Af Amer: 46 mL/min — ABNORMAL LOW (ref >60)
Glucose, Bld: 158 mg/dL — ABNORMAL HIGH (ref 70–99)
Potassium: 3.6 mEq/L (ref 3.5–5.1)
Sodium: 141 mEq/L (ref 135–145)

## 2011-04-15 LAB — GLUCOSE, CAPILLARY: Glucose-Capillary: 174 mg/dL — ABNORMAL HIGH (ref 70–99)

## 2011-04-15 NOTE — Telephone Encounter (Signed)
Please, fax the labs to [facility] - he is in room 1443. Thx

## 2011-04-15 NOTE — Telephone Encounter (Signed)
Labs faxed

## 2011-04-15 NOTE — Telephone Encounter (Signed)
Frank Mclean, please, call wi

## 2011-04-16 ENCOUNTER — Ambulatory Visit: Payer: Medicare Other | Admitting: Cardiology

## 2011-04-16 ENCOUNTER — Inpatient Hospital Stay (HOSPITAL_COMMUNITY)
Admission: RE | Admit: 2011-04-16 | Discharge: 2011-04-20 | DRG: 287 | Disposition: A | Discharge status: Home / Self Care | Payer: Medicare Other | Source: Ambulatory Visit | Attending: Cardiology | Admitting: Cardiology

## 2011-04-16 DIAGNOSIS — I5021 Acute systolic (congestive) heart failure: Secondary | ICD-10-CM

## 2011-04-16 LAB — GLUCOSE, CAPILLARY
Glucose-Capillary: 130 mg/dL — ABNORMAL HIGH (ref 70–99)
Glucose-Capillary: 135 mg/dL — ABNORMAL HIGH (ref 70–99)
Glucose-Capillary: 112 mg/dL — ABNORMAL HIGH (ref 70–99)

## 2011-04-16 LAB — BASIC METABOLIC PANEL
GFR calc Af Amer: 52 mL/min — ABNORMAL LOW (ref >60)
GFR calc non Af Amer: 43 mL/min — ABNORMAL LOW (ref >60)
Potassium: 3.7 mEq/L (ref 3.5–5.1)
Sodium: 144 mEq/L (ref 135–145)

## 2011-04-16 LAB — POCT I-STAT 3, ART BLOOD GAS (G3+)
Acid-Base Excess: 3 mmol/L — ABNORMAL HIGH (ref 0.0–2.0)
TCO2: 28 mmol/L (ref 0–100)
pH, Arterial: 7.475 — ABNORMAL HIGH (ref 7.350–7.450)

## 2011-04-16 LAB — PROTIME-INR: INR: 1.48 (ref 0.00–1.49)

## 2011-04-16 LAB — HEPARIN LEVEL (UNFRACTIONATED): Heparin Unfractionated: <0.1 IU/mL — ABNORMAL LOW (ref 0.30–0.70)

## 2011-04-16 LAB — POCT I-STAT 3, VENOUS BLOOD GAS (G3P V)
Bicarbonate: 27.2 mEq/L — ABNORMAL HIGH (ref 20.0–24.0)
pH, Ven: 7.404 — ABNORMAL HIGH (ref 7.250–7.300)
pO2, Ven: 27 mmHg — CL (ref 30.0–45.0)

## 2011-04-16 NOTE — Consult Note (Signed)
NAMEDAIN, LASETER NO.:  [id number]  MEDICAL RECORD NO.:  [id number]           PATIENT TYPE:  I  LOCATION:  1443                         FACILITY:  [facility]  PHYSICIAN:  Colleen Can. Deborah Chalk, M.D.DATE OF BIRTH:  06/25/1941  DATE OF CONSULTATION:  04/14/2011 DATE OF DISCHARGE:                                CONSULTATION   REFERRING PHYSICIAN:  [facility]  We were asked to see Mr. Arntson by the [facility] for evaluation of congestive heart failure.  He is a pleasant 70 year old black male with the onset of dyspnea for the last 5 days.  Began last Friday, but he recovered from the shortness of breath and was able to work in his yard on Saturday.  However, he had marked shortness of breath on Saturday evening with relief with his wife's inhaler.  He had recurrent shortness of breath over the past several days and nights.  He was originally scheduled to have cardiology consultation in 2 more days, but because of increasing shortness of breath last night, he is brought to the emergency room for evaluation. He has not had any chest pain.  He has had no orthopnea.  He had no nausea, vomiting, or diaphoresis.  He has had no recent increase in sodium intake or any other particular change in his health habits.  He has long history of hypertension with a history of CVA x4 in 2004.  He had rehab and had no residual noted.  He has a history of diabetes.  He has been on metformin.  He has been on chronic warfarin anticoagulation. He has been on lipid-lower medications as well as antihypertensives.  He has been on chronic furosemide once each day.  PAST MEDICAL HISTORY:  Includes, 1. No surgeries. 2. He has had a history of hypertension. 3. Peripheral neuropathy. 4. Stroke. 5. Transient ischemic attack.  OTHER HOSPITALIZATIONS:  None.  CURRENT MEDICINES: 1. Aspirin 81 mg a day. 2. Furosemide 40 mg once a day. 3. Doxazosin 1 mg once a day. 4.  Atorvastatin 20 mg a day. 5. Coumadin 5 mg as directed. 6. Potassium chloride 20 mEq twice a day. 7. Clonidine 0.3 mg 3 times a day. 8. Metformin 500 mg once a day. 9. Labetalol 100 mg 3 times a day.  SOCIAL HISTORY:  There is no drug abuse.  He is a nondrinker, nonsmoker. He is married.  FAMILY HISTORY:  There is family history of hypertension.  REVIEW OF SYSTEMS:  As noted above.  He has rare nocturia.  In general, he is felt well.  He has not had any particular pains and when he is not short of breath overall, he does well.  He does not have any significant gastroesophageal reflux.  PHYSICAL EXAMINATION:  GENERAL:  He is a very pleasant 70 year old black male who is muscular, well developed, and not obese. HEENT:  Negative. NECK:  Supple without bruits.  There is no jugular venous distention. LUNGS:  Reasonably clear. HEART:  Shows a regular rate and rhythm.  No S3.  There is no murmur. ABDOMEN:  Soft, nontender, no organomegaly.  Normal bowel  sounds. GENITAL/RECTAL:  Deferred. EXTREMITIES:  Without edema. NEUROLOGICAL:  He is intact.  Pulses are satisfactory.  EKG shows LVH with left anterior hemiblock and no acute changes, normal sinus rhythm.  Chest x-ray showed increased heart size and mild edema.  PERTINENT LABS:  White count 10,500, hematocrit is 34.9, platelets are 241,000.  BMET shows sodium 139, potassium of 3.2, chloride of 104, CO2 of 27, BUN 14, creatinine 1.39.  Glucose was 209.  BNP was 559.  INR was 2.5.  Point of care markers showed negative cardiac enzymes.  OVERALL IMPRESSION: 1. Hypertensive heart disease with new onset congestive heart failure,     questionable ischemic in origin. 2. History of cerebrovascular accident x4 in 2004. 3. Diabetes mellitus. 4. Hypertension. 5. Gout. 6. Warfarin anticoagulation. 7. Increased lipids.  PLAN AND DISCUSSION:  I am certainly concerned that this may be new- onset ischemic heart disease for Mr. Lajuana Ripple.  He  has an abnormal EKG and because of the conduction disturbance, I am not sure we can really tell whether or not he has new ischemic EKG changes.  I would like to hold his warfarin with plans and anticipate a cardiac catheterization in 2 days.  We will get a 2-D echocardiogram.  We will add nitrates.  We will check a followup BNP after initiation of therapy.     Colleen Can. Deborah Chalk, M.D.     SNT/MEDQ  D:  04/14/2011  T:  04/15/2011  Job:  [id number]  Electronically Signed by Roger Shelter M.D. on 04/16/2011 03:52:47 PM

## 2011-04-17 LAB — PROTIME-INR
INR: 1.32 (ref 0.00–1.49)
Prothrombin Time: 16.6 seconds — ABNORMAL HIGH (ref 11.6–15.2)

## 2011-04-17 LAB — CBC
MCV: 79.4 fL (ref 78.0–100.0)
Platelets: 272 10*3/uL (ref 150–400)
RBC: 4.22 MIL/uL (ref 4.22–5.81)
WBC: 11.8 10*3/uL — ABNORMAL HIGH (ref 4.0–10.5)

## 2011-04-17 LAB — BASIC METABOLIC PANEL
BUN: 16 mg/dL (ref 6–23)
Chloride: 106 mEq/L (ref 96–112)
GFR calc non Af Amer: 48 mL/min — ABNORMAL LOW (ref >60)
Potassium: 3.6 mEq/L (ref 3.5–5.1)
Sodium: 143 mEq/L (ref 135–145)

## 2011-04-17 LAB — GLUCOSE, CAPILLARY
Glucose-Capillary: 149 mg/dL — ABNORMAL HIGH (ref 70–99)
Glucose-Capillary: 184 mg/dL — ABNORMAL HIGH (ref 70–99)
Glucose-Capillary: 134 mg/dL — ABNORMAL HIGH (ref 70–99)

## 2011-04-18 LAB — BASIC METABOLIC PANEL
Chloride: 107 mEq/L (ref 96–112)
GFR calc Af Amer: 50 mL/min — ABNORMAL LOW (ref >60)
GFR calc non Af Amer: 42 mL/min — ABNORMAL LOW (ref >60)
Potassium: 3.8 mEq/L (ref 3.5–5.1)
Sodium: 140 mEq/L (ref 135–145)

## 2011-04-18 LAB — GLUCOSE, CAPILLARY
Glucose-Capillary: 143 mg/dL — ABNORMAL HIGH (ref 70–99)
Glucose-Capillary: 114 mg/dL — ABNORMAL HIGH (ref 70–99)

## 2011-04-18 LAB — PROTIME-INR: INR: 1.19 (ref 0.00–1.49)

## 2011-04-18 LAB — BRAIN NATRIURETIC PEPTIDE: Pro B Natriuretic peptide (BNP): 161 pg/mL — ABNORMAL HIGH (ref 0.0–100.0)

## 2011-04-19 LAB — GLUCOSE, CAPILLARY
Glucose-Capillary: 107 mg/dL — ABNORMAL HIGH (ref 70–99)
Glucose-Capillary: 123 mg/dL — ABNORMAL HIGH (ref 70–99)
Glucose-Capillary: 111 mg/dL — ABNORMAL HIGH (ref 70–99)

## 2011-04-19 LAB — BASIC METABOLIC PANEL
BUN: 19 mg/dL (ref 6–23)
Chloride: 107 mEq/L (ref 96–112)
GFR calc non Af Amer: 43 mL/min — ABNORMAL LOW (ref >60)
Glucose, Bld: 162 mg/dL — ABNORMAL HIGH (ref 70–99)
Potassium: 3.7 mEq/L (ref 3.5–5.1)
Sodium: 140 mEq/L (ref 135–145)

## 2011-04-19 LAB — BRAIN NATRIURETIC PEPTIDE: Pro B Natriuretic peptide (BNP): 224 pg/mL — ABNORMAL HIGH (ref 0.0–100.0)

## 2011-04-19 LAB — PROTIME-INR: Prothrombin Time: 17.1 seconds — ABNORMAL HIGH (ref 11.6–15.2)

## 2011-04-20 LAB — BASIC METABOLIC PANEL
BUN: 18 mg/dL (ref 6–23)
Calcium: 9 mg/dL (ref 8.4–10.5)
Creatinine, Ser: 1.45 mg/dL (ref 0.4–1.5)
GFR calc non Af Amer: 48 mL/min — ABNORMAL LOW (ref >60)
Glucose, Bld: 163 mg/dL — ABNORMAL HIGH (ref 70–99)

## 2011-04-20 LAB — PROTIME-INR
INR: 1.68 — ABNORMAL HIGH (ref 0.00–1.49)
Prothrombin Time: 20 seconds — ABNORMAL HIGH (ref 11.6–15.2)

## 2011-04-22 ENCOUNTER — Ambulatory Visit (INDEPENDENT_AMBULATORY_CARE_PROVIDER_SITE_OTHER): Payer: Medicare Other | Admitting: *Deleted

## 2011-04-22 DIAGNOSIS — I6350 Cerebral infarction due to unspecified occlusion or stenosis of unspecified cerebral artery: Secondary | ICD-10-CM

## 2011-04-22 LAB — POCT INR: INR: 1.9

## 2011-04-24 ENCOUNTER — Encounter: Payer: Self-pay | Admitting: Cardiology

## 2011-04-27 NOTE — Discharge Summary (Addendum)
NAMEBERNARR, LONGSWORTH NO.:  [id number]  MEDICAL RECORD NO.:  [id number]           PATIENT TYPE:  I  LOCATION:  3730                         FACILITY:  [facility]  PHYSICIAN:  Cassell Clement, M.D. DATE OF BIRTH:  1941-07-14  DATE OF ADMISSION:  04/16/2011 DATE OF DISCHARGE:  04/20/2011                              DISCHARGE SUMMARY   DISCHARGE DIAGNOSES: 1. 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     a.     Ejection fraction of 35-40% by echo on April 15, 2011.     b.     Discharge weight 105.67 kg.     c.     Not on ACE or ARB secondary to renal insufficiency. 2. Nonobstructive coronary artery disease by catheterization  on April 16, 2011. 3. Hypertension. 4. Diabetes mellitus type 2. 5. Stroke and transient ischemic attack, on chronic Coumadin. 6. Peripheral myopathy. 7. Hyperlipidemia. 8. Acute renal insufficiency with creatinine 1.45 on discharge.  HOSPITAL COURSE:  Mr. Vanleer is a 70 year old gentleman with the history of hypertension, diabetes, CVA on chronic Coumadin who presented to [facility] with onset of dyspnea for the last 5 days.  He had some relief with his wife's inhaler.  He was originally scheduled on Cardiology consultation because of increasing shortness of breath.  He was brought to the ER for evaluation.  He has not had any chest pain.  His INR was therapeutic at 3.5.  Cardiac enzymes were cycled and were negative x4.  BNP was 477.  A 2-D echocardiogram was performed, which showed an EF of 35-40%, which was a new diagnosis for the patient.  Coumadin was held in anticipation for catheterization to rule out ischemia as a cause for his reduced LV function.  His INR drifted down.  He underwent catheterization on April 16, 2011, demonstrating nonobstructive CAD with mildly elevated pulmonary pressures and elevated end-diastolic pressure.  Hydralazine was increased and he was started on  Lasix for his elevated LVEDP.  Labetalol and clonidine were also titrated.  He remained hypertensive this hospitalization, so Norvasc was also added as well as Imdur.  Senokot was held because of renal insufficiency.  His creatinine that peak with 1.64 at which time he was receiving Lasix b.i.d.  This was cut back down and his creatinine trended back down to 1.45.  The patient was seen by case management and felt to be a candidate for home health nursing, which is currently being set up.  He is doing well today.  INR is still subtherapeutic after resumption of Coumadin at 1.68, however, this will be will be titrated and monitored as an outpatient.  Dr. Patty Sermons has seen and examined today feels he is stable for discharge.  DISCHARGE LABS:  INR 1.68, sodium 140, potassium 4.3, chloride 107, CO2 25, glucose 163, BUN 18, creatinine 1.45.  BNP 221. TSH 2.227, total cholesterol 125, triglycerides 189, HDL of 38, LDL 71.  A1c is 7.4.  STUDIES: 1. Chest x-ray, on April 14, 2011 showed stable cardiac enlargement  and probable mild interstitial edema, done at [facility] prior     transfer to [facility]. 2. Cardiac catheterization  on April 16, 2011, please see full report     for details as well as HPI for summary. 3. A 2-D echocardiogram on April 15, 2011 demonstrated EF of 35-40%.     RV was moderately dilated, RA was mildly dilated and there was a     small pericardial effusion with no evidence of hemodynamic     compromise.  MEDICATIONS: 1. Amlodipine 10 mg daily. 2. Hydralazine 50 mg t.i.d. 3. Imdur 6 mg daily. 4. Clonidine 0.2 mg t.i.d. 5. Labetalol 200 mg b.i.d. 6. Coumadin 5 mg.  I discussed this dosing with pharmacy, who would     like him to take one and half tablet today then resume his home     dose and taking one half tablets Monday, Wednesday, and Friday and     1 tablet on Tuesday, Thursday, Saturday, and Sunday. 7. Allopurinol 300 mg daily. 8. Aspirin 81 mg daily. 9.  Budesonide/formoterol 160/4.5 mcg 2 puffs inhaled b.i.d. 10.Vitamin D3 1000 units daily. 11.Vitamin B12, 1000 mcg daily. 12.Doxazosin 1 mg daily at bedtime. 13.Furosemide 40 mg daily. 14.Lipitor 20 mg daily. 15  metformin 500 mg every morning. 1. Potassium chloride 20 mEq b.i.d. 2. Triamcinolone topical 1 application topically b.i.d. p.r.n. 3. Vicodin 5/500 mg q.i.d. p.r.n. pain.  DISPOSITION:  Mr. Searls will be discharged in stable condition to home. He is not to lift anything over 5 pounds for 1 week, drive for 2 days, or participate any sexual activity for 1 week.  He is to follow low- salt, heart-healthy diet and is instructed to pay close attention regarding daily weights and notifying him, also symptoms that are concerning to him.  If he notices any pain, swelling, bleeding or pus at his cath site, he is to call or return.  He will have his first INR check  as an outpatient on Apr 22, 2011, at 9:15 a.m. at [facility] and then follow with Dr. Antoine Poche on Apr 28, 2011, at 9:15 a.m.  He is to resume follow up with Dr. Loren Racer office as scheduled.  Home health will also be arranged.  DURATION OF DISCHARGE ENCOUNTER:  Greater than 30 minutes including physician and PA time.     Ronie Spies, P.A.C.   ______________________________ Cassell Clement, M.D.    DD/MEDQ  D:  04/20/2011  T:  04/21/2011  Job:  [id number]  cc:   Georgina Quint. Plotnikov, MD Rollene Rotunda, MD, [facility]  Electronically Signed by Ronie Spies  on 04/27/2011 01:09:01 PM Electronically Signed by Cassell Clement M.D. on 05/03/2011 04:42:04 AM

## 2011-04-28 ENCOUNTER — Encounter: Payer: Self-pay | Admitting: Cardiology

## 2011-04-28 ENCOUNTER — Ambulatory Visit (INDEPENDENT_AMBULATORY_CARE_PROVIDER_SITE_OTHER): Payer: Medicare Other | Admitting: Cardiology

## 2011-04-28 ENCOUNTER — Ambulatory Visit (INDEPENDENT_AMBULATORY_CARE_PROVIDER_SITE_OTHER): Payer: Medicare Other | Admitting: *Deleted

## 2011-04-28 VITALS — BP 156/94 | HR 59 | Ht 74.5 in | Wt 218.8 lb

## 2011-04-28 DIAGNOSIS — E785 Hyperlipidemia, unspecified: Secondary | ICD-10-CM

## 2011-04-28 DIAGNOSIS — N259 Disorder resulting from impaired renal tubular function, unspecified: Secondary | ICD-10-CM

## 2011-04-28 DIAGNOSIS — I2510 Atherosclerotic heart disease of native coronary artery without angina pectoris: Secondary | ICD-10-CM | POA: Insufficient documentation

## 2011-04-28 DIAGNOSIS — I6350 Cerebral infarction due to unspecified occlusion or stenosis of unspecified cerebral artery: Secondary | ICD-10-CM

## 2011-04-28 DIAGNOSIS — I10 Essential (primary) hypertension: Secondary | ICD-10-CM

## 2011-04-28 DIAGNOSIS — I509 Heart failure, unspecified: Secondary | ICD-10-CM

## 2011-04-28 DIAGNOSIS — I5022 Chronic systolic (congestive) heart failure: Secondary | ICD-10-CM

## 2011-04-28 MED ORDER — HYDRALAZINE HCL 50 MG PO TABS: ORAL_TABLET | ORAL | Status: DC

## 2011-04-28 MED ORDER — ISOSORBIDE MONONITRATE ER 120 MG PO TB24: 120.0000 mg | ORAL_TABLET | ORAL | Status: DC

## 2011-04-28 NOTE — Assessment & Plan Note (Signed)
I reviewed his lipids in the hospital with an HDL of 38 and LDL of 71. He will remain on the meds as listed.

## 2011-04-28 NOTE — Progress Notes (Signed)
HPI The patient presents for evaluation of tarry myopathy after her recent hospitalization. 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 Since being discharged a few days ago he has felt much better. He says his breathing is improved. He walks daily. He denies any PND or orthopnea. He denies any palpitations, presyncope or syncope. He has no chest pressure, neck or arm discomfort. He is having no weight or edema. His blood pressure is still running in the 150s at home.  Allergies  Allergen Reactions  . Ace Inhibitors     REACTION: cough    Current Outpatient Prescriptions  Medication Sig Dispense Refill  . allopurinol (ZYLOPRIM) 300 MG tablet Take 300 mg by mouth daily.        [name] amLODipine (NORVASC) 10 MG tablet       . aspirin (ASPIRIN CHILDRENS) 81 MG chewable tablet Chew 1 tablet (81 mg total) by mouth daily.  36 tablet  11  . atorvastatin (LIPITOR) 20 MG tablet Take 20 mg by mouth daily.        . budesonide-formoterol (SYMBICORT) 160-4.5 MCG/ACT inhaler Inhale 2 puffs into the lungs 2 (two) times daily.  1 Inhaler  3  . Cholecalciferol 1000 UNITS tablet Take 1,000 Units by mouth daily.        . Cholecalciferol 5000 UNITS capsule Take 5,000 Units by mouth daily.        . cloNIDine (CATAPRES) 0.2 MG tablet Take 0.2 mg by mouth 3 (three) times daily.        . clotrimazole-betamethasone (LOTRISONE) cream Apply topically 2 (two) times daily as needed.        . cyanocobalamin 1000 MCG tablet Take 100 mcg by mouth daily.        [name] doxazosin (CARDURA) 1 MG tablet Take 1 mg by mouth at bedtime.        . furosemide (LASIX) 40 MG tablet Take 40 mg by mouth daily.        . hydrALAZINE (APRESOLINE) 50 MG tablet 50 mg. Three times daily      . HYDROcodone-acetaminophen (VICODIN) 5-500 MG per tablet Take 1 tablet by mouth 4 (four) times daily as needed.        . Insulin Syringe-Needle U-100 (B-D INS SYR MICROFINE 1CC/27G) 27G X 5/8" 1 ML MISC  as directed.        . isosorbide mononitrate (IMDUR) 60 MG 24 hr tablet       . labetalol (NORMODYNE) 200 MG tablet Take 200 mg by mouth 2 (two) times daily.        . metformin (FORTAMET) 500 MG (OSM) 24 hr tablet Take 500 mg by mouth daily with breakfast.        . mupirocin (BACTROBAN) 2 % ointment Apply topically daily.        [name] NITROSTAT 0.4 MG SL tablet       . potassium chloride SA (K-DUR,KLOR-CON) 20 MEQ tablet Take 20 mEq by mouth 2 (two) times daily.        [name] triamcinolone (KENALOG) 0.5 % cream Apply topically 2 (two) times daily as needed.        . warfarin (COUMADIN) 5 MG tablet Take 1 tablet (5 mg total) by mouth as directed.  45 tablet  3  . DISCONTD: cloNIDine (CATAPRES) 0.1 MG tablet Take 0.1 mg by mouth 2 (two) times daily.        [name] DISCONTD: cyanocobalamin (,VITAMIN B-12,) 1000 MCG/ML injection Inject 1,000  mcg into the muscle every 14 (fourteen) days.        [name] DISCONTD: labetalol (NORMODYNE) 100 MG tablet Take 100 mg by mouth 3 (three) times daily.        [name] DISCONTD: NIFEdipine (PROCARDIA-XL) 60 MG (OSM) 24 hr tablet Take 120 mg by mouth daily.        [name] DISCONTD: olmesartan (BENICAR) 20 MG tablet Take 20 mg by mouth daily.          Past Medical History  Diagnosis Date  . Gout   . HTN (hypertension)   . Renal insufficiency   . Hyperlipidemia   . Osteoarthritis   . History of CVA (cerebrovascular accident)   . GERD (gastroesophageal reflux disease)   . DJD (degenerative joint disease)   . CHF (congestive heart failure)   . BPH (benign prostatic hypertrophy)   . Type II or unspecified type diabetes mellitus without mention of complication, not stated as uncontrolled     Past Surgical History  Procedure Date  . Persantine cardiolite     03/11/2000  . Lower arterial     04/28/2005    ROS: PHYSICAL EXAM BP 156/94  Pulse 59  Ht 6' 2.5" (1.892 m)  Wt 218 lb 12.8 oz (99.247 kg)  BMI 27.72 kg/m2 GENERAL:  Well appearing HEENT:  Pupils equal round and reactive,  fundi not visualized, oral mucosa unremarkable, upper dentures NECK:  No jugular venous distention, waveform within normal limits, carotid upstroke brisk and symmetric, no bruits, no thyromegaly LYMPHATICS:  No cervical, inguinal adenopathy LUNGS:  Clear to auscultation bilaterally BACK:  No CVA tenderness CHEST:  Unremarkable HEART:  PMI not displaced or sustained,S1 and S2 within normal limits, no S3, no S4, no clicks, no rubs, no murmurs ABD:  Flat, positive bowel sounds normal in frequency in pitch, no bruits, no rebound, no guarding, no midline pulsatile mass, no hepatomegaly, no splenomegaly EXT:  2 plus pulses throughout, no edema, no cyanosis no clubbing SKIN:  No rashes no nodules NEURO:  Cranial nerves II through XII grossly intact, motor grossly intact throughout PSYCH:  Cognitively intact, oriented to person place and time   EKG:  Sinus rhythm, rate 59, left bundle branch block, left axis deviation  ASSESSMENT AND PLAN

## 2011-04-28 NOTE — Assessment & Plan Note (Signed)
The patient will continue with medical management and lifestyle modification. No further imaging is indicated.

## 2011-04-28 NOTE — Assessment & Plan Note (Signed)
He remains on Coumadin having this followed by our clinic.

## 2011-04-28 NOTE — Assessment & Plan Note (Signed)
This has been stable and can be followed future blood work we are avoiding ACE/ARB.

## 2011-04-28 NOTE — Patient Instructions (Signed)
Increase Imdur to 120 mg a day Increase Hydralazine to 50 mg one and 1/2 tablets three times a day Follow up in 2 months with Dr Antoine Poche

## 2011-04-28 NOTE — Discharge Summary (Addendum)
  [name], Frank Mclean NO.:  [id number]  MEDICAL RECORD NO.:  [id number]           PATIENT TYPE:  I  LOCATION:  3730                         FACILITY:  [facility]  PHYSICIAN:  Cassell Clement, M.D. DATE OF BIRTH:  1941-05-08  DATE OF ADMISSION:  04/16/2011 DATE OF DISCHARGE:  04/20/2011                              DISCHARGE SUMMARY   ADDENDUM  Please note, the patient was also discharged with a prescription for nitroglycerin sublingual 0.4 mg every 5 minutes up to 3 doses for chest pain.     Ronie Spies, P.A.C.   ______________________________ Cassell Clement, M.D.    DD/MEDQ  D:  04/27/2011  T:  04/28/2011  Job:  [id number]  cc:   Georgina Quint. Plotnikov, MD Rollene Rotunda, MD, [facility]  Electronically Signed by Ronie Spies  on 04/28/2011 04:12:38 PM Electronically Signed by Cassell Clement M.D. on 05/03/2011 04:42:12 AM

## 2011-04-28 NOTE — Assessment & Plan Note (Signed)
Today I will further titrate his meds by increasing Imdur to 120 hydralazine 75 mg t.i.d.

## 2011-05-03 ENCOUNTER — Telehealth: Payer: Self-pay | Admitting: Cardiology

## 2011-05-03 ENCOUNTER — Encounter: Payer: Self-pay | Admitting: Cardiology

## 2011-05-03 NOTE — Telephone Encounter (Signed)
Mailed out CMS Approved Concent to  [facility] [address] [address] [name] [id number] 05/03/11/km.per orders Pam Fleming/copy kept in Drawer/blue folder

## 2011-05-04 NOTE — Assessment & Plan Note (Signed)
[facility]                                 ON-CALL NOTE   Frank Mclean, Frank Mclean                        MRN:          [id number]  DATE:08/28/2007                            DOB:          February 01, 1941    PCP:  Georgina Quint. Plotnikov, MD   DATE OF BIRTH  March 01, 1941.   PHONE CALL:  Ms. Frank Mclean calls in today in behalf of her husband,  stating that he is on anticoagulation with Coumadin.  She reports that  he was doing well until 30 minutes ago, when he noticed blood with  urination.  He was asymptomatic except for the blood he noted.  He went  again just a few minutes ago and there was a significant increase in  blood when he urinated.   I advised the wife to take her husband to the emergency department as  soon as possible, and to take all the medications he takes with him to  the emergency department.  She agreed and will be taking him to either  [facility] or [facility] Emergency  Department.     Leanne Chang, M.D.  Electronically Signed    LA/MedQ  DD: 08/28/2007  DT: 08/29/2007  Job #: [id number]   cc:   Georgina Quint. Plotnikov, MD

## 2011-05-05 ENCOUNTER — Other Ambulatory Visit: Payer: Self-pay | Admitting: Internal Medicine

## 2011-05-07 NOTE — H&P (Signed)
NAME:  JUD, FANGUY NO.:  [id number]   MEDICAL RECORD NO.:  [id number]                   PATIENT TYPE:  EMS   LOCATION:  MAJO                                 FACILITY:  [facility]   PHYSICIAN:  Melvyn Novas, M.D.               DATE OF BIRTH:  1941/09/20   DATE OF ADMISSION:  06/04/2003  DATE OF DISCHARGE:                                HISTORY & PHYSICAL   HISTORY:  [name] is a patient of Dr. Jacinta Shoe, who presented  here around midnight as a 70 year old, right-handed, married African-  American gentleman.  He has a history of hypertension, difficult to control,  gout, fluid retention, and he was brought home by colleagues from work after  presenting there with sudden onset of slurred speech.  The wife at home  became very concerned, noticing a facial droop and what she believes was  incoordination on the right side of his body, and called EMS.  EMS asked her  not to give her husband anything by mouth, so aspirin was given in the  hospital, but not yet at home.  The patient also had, according to his  coworkers, word-finding difficulties at work.  If true aphasia or only  dysarthria was present, it is difficult to state at this time.   Here he presents with right-sided facial weakness, lower facial droop not  involving the forehead.  Dysarthria but no aphasia upon evaluation.  He had  a similar episode in December of 2003, he said, but he did not seek medical  attention.  At the time, his wife was visiting relatives, and he did not  discuss this problem with Dr. Posey Rea, either.  The patient had not been  on aspirin.   PAST MEDICAL HISTORY:  1. Hypertension.  2. Transient ischemic attack.  3. Congestive heart failure.  4. Gout.   MEDICATIONS:  1. Doxazosin 4 mg every day  2. Clonidine 0.3 mg b.i.d.  3. Adalat 90 mg daily.  4. Lasix 40 mg q.a.m.  5. KCl 40 mg q.a.m.  6. Allopurinol 100 mg daily.   ALLERGIES:  None.   SOCIAL HISTORY:  Full time employed as an Art gallery manager, married, grown children,  no alcohol, no mental changes.   FAMILY HISTORY:  Positive for hypertension, MI, CVA.  His mother had kidney  failure and recently had bypass surgery.  She is currently hospitalized here  at [facility] on the original floor.   PHYSICAL EXAMINATION:  VITAL SIGNS:  Temperature 149/90, heart rate 68,  respiratory rate 12, temperature 98.7.  LUNGS:  Clear to auscultation.  COR:  Regular rate and rhythm.  No murmur or carotid bruits noted.  __________  tested.  Mucous membranes, well perfused.  EXTREMITIES:  No clubbing, no cyanosis, no edema, no rash or hypo or  hyperpigmentation.  PULSES:  All pulses are palpable.  ABDOMEN:  Soft, nontender with  positive bowel sounds.  No flank pain.  NEUROLOGICAL EXAMINATION:  No __________.  The patient was alert and  oriented x3.  The patient is dysarthric but can follow multi-step commands.  There was no apraxia or dysphasia.  Cranial nerves:  Pupils react equally.  __________  Extraocular movements are intact as well as pursued in the  horizontal and vertical plane.  Full visual fields bilateral to simultaneous  synchronous stimulation. Facial droop on the right.  Central mild.  Tongue  protrudes in the midline.  Neck is supple.  Motor exam shows 5/5 bilateral  and equal strength.  Tone is __________  No pronator drift.  Normal  reflexes.  2+ bilaterally was downgoing as well as to plantar stimulation.  Grip as well as dorsiflexion and plantar flexion are perceived as  bilaterally equally.  Sensory is intact to pinprick, touch, vibration, and  temperature.  Coordination shows normal finger-to-nose touch on the left.  There is a dysmetria on the right, but no ataxia or tremor.   ASSESSMENT:  1. The patient has risk factors for a stroke and transient ischemic attack.     He seems to be slowing resolving ischemic event.  I cannot __________     status.  This will be an  ischemic cerebral infarction or remain a     transient ischemic attack within the next 20 hours.  He had a similar     episode six months ago which makes the transient ischemic attack more     likely.  2. His history of difficult-to-control hypertension is definitely risk     factor.  The patient has also not been on aspirin.   PLAN:  1. Do a full workup for ischemic cerebral infarction, MRI, a 2-D     echocardiogram, carotid Dopplers, blood work, and start the patient on     aspirin.  2. We will gently handle his antihypertensive and continue his allopurinol.  3. Since the patient is on various antihypertensives such as Lasix,     doxazosin, clonidine and Adalat, we will need to watch his systolic blood     pressures closely as not to lower them too suddenly.  I have decided to     hold the Adalat but continue clonidine and to reduce Lasix slightly.     Doxazosin will be continued as well.  4. Stroke M.D. service to follow.                                               Melvyn Novas, M.D.    CD/MEDQ  D:  06/04/2003  T:  06/04/2003  Job:  [id number]

## 2011-05-07 NOTE — H&P (Signed)
NAME:  [name], JU NO.:  [id number]   MEDICAL RECORD NO.:  [id number]                   PATIENT TYPE:  INP   LOCATION:  3039                                 FACILITY:  [facility]   PHYSICIAN:  Marlan Palau, M.D.               DATE OF BIRTH:  [birth year]   DATE OF ADMISSION:  06/14/2003  DATE OF DISCHARGE:                                HISTORY & PHYSICAL   HISTORY OF PRESENT ILLNESS:  Frank Mclean is a 70 year old right-handed  black male, born 1941-07-04, who is well known to the [facility] stroke  service.  This patient has been admitted at this point for the third time  this month for episodes of slurred speech and right-sided symptoms.  Patient  initially was admitted on June 04, 2003, with dysarthria that seemed to  clear.  This patient underwent an MRI scan of the brain showing moderately  extensive small-vessel ischemic changes, but no acute infarct was seen.  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  Patient had a small diseased right vertebral artery on MRI  angiography and a left vertebral artery that was patent.  Basilar system was  patent.  MRI angiogram of the carotid vessels was unremarkable.  Patient  underwent a carotid Doppler study at that time that showed no internal  carotid artery stenosis; antegrade vertebral artery flow was noted  bilaterally.  A 2-D echocardiogram showed an ejection fraction of 55 to 65%.  There was a mildly dilated left atrium and right atrium.  Patient was  discharged on aspirin but returned on June 08, 2003, with recurring symptoms  of dysarthria.  Dysarthria did not completely clear during the admission,  but a repeat MRI scan of the brain, again, did not show any acute  cerebrovascular infarction.  Patient initially was treated with heparin, was  switched back to aspirin, and discharged home.  Patient, once again,  returned to the emergency room two days later  with some increasing symptoms  of dysarthria.  Patient was sent home from the emergency room and kept on  aspirin.  Patient returns again on June 14, 2003, with worsening dysarthria,  right-sided symptoms at this point, gait disturbance, clumsiness to the  right arm.  Patient denies headache, visual field changes.  Neurology is,  once again, asked to see him for further evaluation.  Blood pressure, once  again, is in the low range with a systolic pressure of 106 lying down.   PAST MEDICAL HISTORY:  Significant for:  1. Onset of dysarthria, right arm clumsiness on this admission.  2. Multiple hospitalizations and ER visits for worsening dysarthria with     stuttering course.  3. 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  4. Hypertension.  5. Gout.  6. Chronic renal insufficiency.  7. Congestive heart failure.  MEDICATIONS PRIOR TO ADMISSION:  1. Included allopurinol 100 mg per day.  2. Adalat 90 mg per day.  3. Potassium 20 mEq b.i.d.  4. Clonidine 0.3 mg twice per day.  5. Doxazosin 4 mg at night.  6. Lasix 80 mg per day.  7. Foltx one per day.  8. Aspirin 325 mg per day.  9. Patient also was recently placed on Benecar, taking 40 mg daily.   ALLERGIES:  Patient has no known allergies.   SOCIAL HISTORY:  Does not smoke or drink.  Social history is notable that  patient is married, lives in the [place] area, works as an Art gallery manager, has  grown children.   FAMILY MEDICAL HISTORY:  Notable for hypertension, coronary artery disease,  cerebrovascular disease, mother with renal insufficiency, bypass surgery.   REVIEW OF SYSTEMS:  Notable for no fevers, chills.  Patient denies headache,  double vision, loss of vision.  Denies problems swallowing.  Denies  shortness of breath, chest pains, nausea, or vomiting.  Denies any numbness  of the extremities.  There is no weakness on the right side, gait  disturbance, slurred speech.   PHYSICAL EXAMINATION:   VITAL SIGNS:  Blood pressure is 106/70, heart rate  60, respiratory rate 18, temperature afebrile.  GENERAL:  This patient is a fairly well-developed black male who is alert,  cooperative at the time of examination.  HEENT EXAMINATION:  Head is atraumatic.  Eyes, pupils are equal, round, and  reactive to light.  Discs are flat bilaterally.  NECK:  Supple.  No carotid bruits are noted.  RESPIRATORY EXAMINATION:  Clear.  CARDIOVASCULAR EXAMINATION:  Reveals a regular rate and rhythm with no  obvious murmurs or rubs noted.  EXTREMITIES:  Without significant edema.  ABDOMEN:  Soft and nontender.  Positive bowel sounds.  No organomegaly is  seen.  NEUROLOGIC:  Patient has, again, full extraocular movements.  Visual fields  are full.  Speech is clearly dysarthric, not aphasic.  Visual fields are  full.  Patient has good strength to direct testing on all four extremities.  Patient has symmetric reflexes, downgoing toes bilaterally.  Patient notes  good pinprick, soft touch, vibratory sensation throughout.  Patient clearly  has dysmetria with finger-to-nose-finger on the right upper extremity; no  drift is seen.  Patient has decreased rapid, alternating movements of right  upper extremity as compared to the left.  Patient has fair toe-to-finger  bilaterally.  Patient is not ambulated.   LABORATORY DATA:  Laboratory values are pending at this time.   IMPRESSION:  1. Recurring symptomatology with dysarthria, right-sided ataxia.  2. Hypertension.  This patient, once again, presents with a bit of a low     blood pressure.  Blood pressure seemed to be labile in nature with rapid     swings, as noted on the prior admission, where patient went from a     systolic blood pressure of 100 to a diastolic of 120 range within the     course of four hours.  Patient will be admitted for management of the     blood pressure, and we will add aspirin and Plavix to his regimen.    Patient likely is having a  step-wise downhill course with a small-vessel     ischemic stroke.  Given the diseased right vertebral artery, do need to     consider the possibility of an evolving Wallenberg's-type stroke.   PLAN:  1. Admission to [facility].  2. MRI of the  brain with MRI angiogram.  3.     Aspirin and Plavix for now.  4. IV fluid hydration.  5. Follow clinical course.  Patient will require physical, occupational, and     speech therapy.                                               Marlan Palau, M.D.    CKW/MEDQ  D:  06/14/2003  T:  06/15/2003  Job:  [id number]   cc:   Georgina Quint. Plotnikov, M.D. [facility]   [facility]  [address]  [address]    cc:   Georgina Quint. Plotnikov, M.D. [facility]   [facility]  [address]  [address]

## 2011-05-07 NOTE — Discharge Summary (Signed)
NAME:  [name], [name] NO.:  [id number]   MEDICAL RECORD NO.:  [id number]                   PATIENT TYPE:  INP   LOCATION:  3017                                 FACILITY:  [facility]   PHYSICIAN:  Marlan Palau, M.D.               DATE OF BIRTH:  [birth year]   DATE OF ADMISSION:  06/08/2003  DATE OF DISCHARGE:  06/10/2003                                 DISCHARGE SUMMARY   ADMISSION DIAGNOSES:  1. New onset of dysarthria, rule out cerebrovascular infarction.  2. Hypertension.   DISCHARGE DIAGNOSES:  1. Dysarthria new onset without MRI correlate, presumed small     brainstem/medullar stroke.  2. Hypertension.   PROCEDURES DURING THIS ADMISSION:  1. MRI scan of the brain.  2. CT scan of the brain.   COMPLICATIONS OF ABOVE PROCEDURES:  None.   HISTORY OF PRESENT ILLNESS:  Frank Mclean is a 70 year old right-handed  black male born 11/10/1941 with a history of hypertension and significant small  vessel disease by CT and MRI and a history of chronic renal insufficiency,  congestive heart failure. The patient was just recently admitted on June 04, 2003 with onset of dysarthria that was transient. MRI scan of the brain  showed no acute stroke. 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 The patient was discharged  after a thorough workup that included 2-D echocardiogram and carotid Doppler  studies as well as MRI/MR angiogram. The patient was placed on aspirin. The  patient returned to the hospital with recurring dysarthria, some lowering of  blood pressure into the 104 systolic range, diaphoresis. The patient seemed  to do better with elevation of blood pressure. The patient was admitted for  an evaluation.   PAST MEDICAL HISTORY:  1. Onset of dysarthria as above.  2. Transient ischemic attack event on June 04, 2003 with full stroke workup     as above.  3. Significant small vessel disease by CT and MRI.  4. Hypertension.  5. Gout.  6. Chronic renal insufficiency.  7. Congestive heart failure.   MEDICATIONS PRIOR TO ADMISSION:  1. Allopurinol 100 mg a day.  2. Adalat 9 mg daily.  3. Potassium 20 mEq twice a day.  4. Clonidine 0.3 mg b.i.d.  5. Doxazosin 4 mg at night.  6. Lasix 40 mg a day.  7. Foltx one a day.  8. Motrin 600 mg twice a day if needed.  9. Aspirin 325 mg a day.  10.      Indomethacin if needed for gout.   SOCIAL HISTORY:  The patient does not smoke or drink.   ALLERGIES:  No known drug allergies.   Please refer to the history and physical dictation for social history,  family history, review of systems, and physical examination.   LABORATORY DATA:  Urinalysis specific gravity 1.017, pH 5.0, otherwise  unremarkable.  White count 10.6, hemoglobin 13.0, hematocrit 38.2, platelets  266, MCV 80.1. INR 0.8, sodium 141, potassium 3.6,  chloride 104, CO2 28,  glucose 150, BUN 23, creatinine 1.9. Calcium 9.1, total protein 6.7, albumin  4.0, AST 34, ALT 31, ALP 115, total bili 0.7. CK 147, MB fraction 3.7,  troponin I 0.03. Chest x-ray shows cardiomegaly, no active disease. CT of  the head shows no acute infarct. EKG reveals sinus bradycardia, left axis  deviation, left ventricular hypertrophy with QRS widening, a heart rate of  57.   HOSPITAL COURSE:  This patient was admitted for an evaluation of new onset  dysarthria. The patient underwent a CT scan of the head that shows no acute  stroke and eventually underwent an MRI scan, which again showed no acute  cerebrovascular infarction, although dysarthria continued. The patient has a  presumed small vessel infarct involving the lower brainstem that cannot be  seen on MRI scan. The patient was initially treated with heparin, but this  was converted to aspirin within the first 24 hours. The patient has remained  stable with good strength sensation throughout. Mild dysarthria remains. No  double vision or visual field  changes noted. At this time, the patient has  been instructed in a low salt diet.   DISCHARGE MEDICATIONS:  1. Allopurinol 100 mg a day.  2. Clonidine 0.3 mg twice a day.  3. Foltx 1 tablet a day.  4. Procardia XL 9 mg one a day.  5. Cardura 8 mg one-half tablet at night.  6. Lasix 40 mg one a day.  7. Potassium 20 mEq one twice a day.  8. Aspirin 325 mg a day.    DISCHARGE INSTRUCTIONS:  The patient is to have no strenuous activity for  three to four weeks and is on a low-sodium diet. Nutrition consult will be  obtained prior to discharge to go over with the patient and the patient's  wife what a low-salt diet is. The patient will follow up with [facility] within four weeks following discharge.                                                Marlan Palau, M.D.    CKW/MEDQ  D:  06/10/2003  T:  06/11/2003  Job:  [id number]   cc:   [facility]  [address]  [address]    cc:   [facility]  [address]  [address]

## 2011-05-07 NOTE — Assessment & Plan Note (Signed)
[facility]                                 ON-CALL NOTE   ZALEN, SEQUEIRA                        MRN:          [id number]  DATE:04/14/2007                            DOB:          01/03/1941    PHONE NUMBER:  [phone].   Wife called.  States that her husband, who has a history of a couple of  strokes, on Coumadin, has had a fever today of 101+, feeling bad and  weak.  He may have had a fever yesterday or the day before when he  complained of coldness but with no localizing symptoms such as chest  pain, shortness of breath, cough, or UTI symptoms.   I recommended he either be seen in the office tomorrow morning or be  evaluated in the emergency room if he has continued to get worse.  Wife  chooses to be seen at [facility] this evening for evaluation of his  fever.     Neta Mends. Panosh, MD  Electronically Signed    WKP/MedQ  DD: 04/14/2007  DT: 04/14/2007  Job #: [id number]

## 2011-05-07 NOTE — H&P (Signed)
NAME:  Frank Mclean, Frank Mclean NO.:  [id number]   MEDICAL RECORD NO.:  [id number]                   PATIENT TYPE:  INP   LOCATION:  3017                                 FACILITY:  [facility]   PHYSICIAN:  Marlan Palau, M.D.               DATE OF BIRTH:  07-26-1941   DATE OF ADMISSION:  06/08/2003  DATE OF DISCHARGE:  06/10/2003                                HISTORY & PHYSICAL   HISTORY OF PRESENT ILLNESS:  The patient is a 70 year old right-handed black  male born April 08, 1941 with a history of cerebrovascular disease just  discharged from the stroke service at [facility] on the 16th of  June 2004.  The patient was admitted on the 15th of June with dysarthria  that cleared.  The patient underwent a work-up at that time with an MRI scan  of the brain showing no acute infarct, but moderately extensive small vessel  ischemic changes including an old right pontine, right internal  capsular/thalamic stroke event.  The patient had a small diseased right  vertebral artery.  Left vertebral artery was patent.  Basilar system was  patent.  MRI angiogram of the carotid vessels were unremarkable.  The  patient underwent a carotid Doppler study as well that showed no internal  carotid artery stenosis.  Antegrade vertebral artery flow was noted  bilaterally.  2-D echocardiogram at that time showed ejection fraction 55-  65%.  Study was otherwise fairly unremarkable with exception of mildly  dilated left atrium and right atrium.  The patient was discharged on  aspirin, but returned two days later with similar symptoms of dysarthria  that began around 1 p.m. today.  The patient was in the emergency room  within two hours.  Dysarthria was severely initially, but has gradually  cleared in the emergency room.  CT scan of the brain shows extensive small  vessel ischemic changes, no acute changes seen.  The patient reports no  headache, numbness or weakness on the face,  arms, or legs, dizziness,  blackout episodes.  The patient was noted to have a somewhat low blood  pressure on admission with blood pressure 108 systolic that has improved  with IV fluid hydration.  The patient was diaphoretic, but denies shortness  of breath, chest pressure, or chest pain.  The patient is being admitted for  further evaluation at this time.   PAST MEDICAL HISTORY:  1. Onset of dysarthria on this admission.  2. TIA on the 15th of June 2004 with dysarthria at that time.  3. Significant small vessel disease by CT scan of the brain as above.  4. Hypertension.  5. History of gout.  6. Chronic renal insufficiency.  7. History of congestive heart failure in the past.   MEDICATIONS:  1. Allopurinol 100 mg daily.  2. Adalat 90 mg daily.  3. Potassium 20 mEq one b.i.d.  4. Clonidine 0.3 mg b.i.d.  5. Doxazosin 4 mg at night.  6. Lasix 80 mg daily.  7. Foltx one daily.  8. Motrin 600 mg b.i.d.  9. Aspirin 325 mg daily.  10.      Indocin one daily.   HABITS:  The patient currently does not smoke or drink.   ALLERGIES:  No known allergies.   SOCIAL HISTORY:  The patient is married.  Lives in the [place] area.  Works as an Art gallery manager.  The patient does have grown children.   FAMILY HISTORY:  Notable for hypertension, coronary artery disease,  cerebrovascular disease, mother with renal insufficiency, bypass surgery.   REVIEW OF SYSTEMS:  At this time notable for no fevers, chills.  The patient  denies headache, neck pain, shortness of breath, chest pain, nausea,  vomiting, abdominal pain, trouble with controlling the bowels or the  bladder, blackout episodes, falls, gait instability, vision changes, double  vision, problems swallowing.   PHYSICAL EXAMINATION:  VITAL SIGNS:  Blood pressure at this time 108/78  initially, now 118/90, heart rate 60, respiratory rate 18, temperature  afebrile.  GENERAL:  This patient is a fairly well-developed black male who is alert   and cooperative at the time of examination.  HEENT:  Head is atraumatic.  Eyes:  Pupils are equal, round, and reactive to  light.  Disks are flat bilaterally.  NECK:  Supple.  No carotid bruits noted.  RESPIRATORY:  Clear.  CARDIOVASCULAR:  Regular rate and rhythm.  No obvious murmurs, rubs noted.  ABDOMEN:  Positive bowel sounds.  No organomegaly or tenderness is noted.  EXTREMITIES:  Without significant edema.  NEUROLOGIC:  As above.  Facial symmetry is present.  The patient has good  sensation to face to pin prick and soft touch bilaterally.  Has good  strength to facial muscles, muscles to head turning/shoulder shrug  bilaterally.  Speech is mildly dysarthric at this time.  No aphasia seen.  The patient protrudes the tongue to midline.  Has good symmetric faces.  Again, good pin prick sensation of the face.  The patient has good strength  in all four extremities.  Good symmetric motor tone is noted throughout.  Sensory testing is intact to pin prick, soft touch, vibratory sensation  throughout.  The patient has fair finger-nose-finger bilaterally,  questionable slight ataxia with toe-to-finger on the right lower extremity  as compared to the left.  The patient was not ambulated.  The patient has no  pronator drift.  Deep tendon reflexes are depressed, but symmetric.  Toes  are neutral bilaterally.   LABORATORIES:  White count 13.8, hemoglobin 13.7, hematocrit 40.5, MCV 80.7,  platelets 249,000.  Sodium 141, potassium 3.6, chloride 104, CO2 28, glucose  150, BUN 23, creatinine 1.9, calcium 9.9, total protein 6.7, albumin 4.0,  AST 34, ALT 31, alkaline phosphatase 115, total bilirubin 0.7.  INR of 0.8.   EKG reveals sinus bradycardia, heart rate of 57, left axis deviation, left  ventricular hypertrophy with QRS widening.   Chest x-ray is pending.   IMPRESSION:  1. Recurring dysarthria.  2. History of hypertension. 3. Significant small vessel ischemic changes by prior work-up,  diseased     right vertebral artery.   The patient has had recurrence of similar symptoms with dysarthria.  This  may be manifestation of small vessel disease/lacunar type infarction in  which case therapy may not be effective.  The patient may go on to progress  to have a stroke event.  This patient has had prior work-up with MRIs, 2-D  echocardiogram, carotid Doppler study and has been just initiated on aspirin  over the last several days.  The patient was somewhat hypotensive coming  into the hospital today.  This may have been initiating factor in his  current symptomatology.  White count was elevated.  Etiology not clear.   PLAN:  1. Admission to [facility].  2. IV heparin therapy for 24 hours, then convert back to aspirin or possibly     Aggrenox.  3. Repeat MRI scan of the brain.  4. Will follow patient's clinical course while in-house.                                               Marlan Palau, M.D.    CKW/MEDQ  D:  06/08/2003  T:  06/10/2003  Job:  [id number]   cc:   Georgina Quint. Plotnikov, M.D. [facility]    cc:   Georgina Quint. Plotnikov, M.D. [facility]

## 2011-05-07 NOTE — Discharge Summary (Signed)
NAME:  Frank Mclean, PARISIEN NO.:  [id number]   MEDICAL RECORD NO.:  [id number]                   PATIENT TYPE:  INP   LOCATION:  3039                                 FACILITY:  [facility]   PHYSICIAN:  Rene Paci, M.D. [facility]          DATE OF BIRTH:  10/17/1941   DATE OF ADMISSION:  06/14/2003  DATE OF DISCHARGE:  06/17/2003                                 DISCHARGE SUMMARY   DISCHARGE DIAGNOSES:  1. Left pons infarct.  2. Dysarthria.  3. Left hemiplegia.  4. Acute on chronic renal insufficiency.  5. Hypertension.   BRIEF ADMISSION HISTORY:  [name] is a 70 year old African American male  who was hospitalized on June 15 through June 16 with dysarthria.  At that  time, MRI of the brain showed moderate extensive small-vessel ischemic  changes, but no acute infarct was seen.  He did have evidence of an old  right pontine, right internal capsular thalamic stroke.  The patient had  some small disease, right vertebral artery on MRI and angiography as well.  The left vertebral artery was patent.  The basilar system was patent as  well.  MRI of the carotid vessels was unremarkable.  Carotid Dopplers also  showed no evidence of internal carotid artery stenosis.  A 2-D echo revealed  preserved LV function with mildly dilated left atrium and right atrium, but  no source of embolus.  The patient was discharged on June 19, with aspirin.  The patient presented back to the emergency room with complaints of  dysarthria, but was again sent home.  On June 14, 2003, the patient was seen  in the office with worsening dysarthria as well as right-sided hemiparesis.  The patient was again admitted for further evaluation.   PAST MEDICAL HISTORY:  1. 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  2. Small-vessel disease by head CT and MRI.  3. Hypertension.  4. Gout.  5. Chronic renal insufficiency.  6. History of congestive heart failure.   HOSPITAL COURSE:  1. Neurologic.  The patient  was readmitted and neurology was again asked to     see the patient.  Dr. Anne Hahn saw the patient.  He was concerned about a     stepwise progression of CVA with his dysarthria and right-sided     hemiataxia and hemiplegia.  He recommended an MRI of the brain.  At this     time, his MRI revealed a left pons infarct, slightly more prominent than     MRI six days prior with probable normal evolution, but cannot rule out     extension.  He also had small right vertebral stem persistent small-     vessel disease.  The patient was seen in consultation by speech and     language pathology as well as physical therapy and occupational therapy.     There was no evidence of dysphasia and there was no need for  continued     outpatient rehabilitation at this time.  We have added Plavix to the     patient's regimen.  2. Hypertension.  The patient have some reflex hypertension, but has been     stable over the past 24 hours.  3. Acute on chronic renal insufficiency.  His renal function has improved.     Currently, his diuretic dose has been decreased.   LABORATORY DATA:  BUN 27, creatinine 1.9, potassium 3.4, SED rate was 7,  SPEP pending.  Urine for catecholamines was pending.   DISCHARGE MEDICATIONS:  1. Plavix 75 mg daily.  2. Aspirin 325 mg daily.  3. Benicar 40 mg daily.  4. Procardia XL 90 mg daily.  5. Clonidine 0.3 mg q.i.d.  6. Lasix 80 mg 1/2 tablet daily.  7. Potassium chloride 20 mEq daily.  8. Foltx daily.  9. Allopurinol 100 mg daily.  10.      Doxazosin 4 mg q.h.s.   FOLLOW UP:  Follow up with Dr. Posey Rea in 1-2 weeks.  Also, the patient  has requested a second opinion at [facility] which Dr. Orlin Hilding says she  will arrange.       Frank Mclean, P.A. [facility]                  Rene Paci, M.D. [facility]    LC/MEDQ  D:  06/17/2003  T:  06/17/2003  Job:  [id number]   cc:   Georgina Quint. Plotnikov, M.D. [facility]   Marlan Palau, M.D.  [address]  [address]  [place]  [place]  [address]  Fax: [phone]    cc:   Georgina Quint. Plotnikov, M.D. [facility]   Marlan Palau, M.D.  [address]  [address]  [place]  [place] [address]  Fax: [phone]

## 2011-05-07 NOTE — Discharge Summary (Signed)
NAME:  Frank Mclean, Frank Mclean NO.:  [id number]   MEDICAL RECORD NO.:  [id number]                   PATIENT TYPE:  INP   LOCATION:  3032                                 FACILITY:  [facility]   PHYSICIAN:  Deanna Artis. Sharene Skeans, M.D.           DATE OF BIRTH:  May 22, 1941   DATE OF ADMISSION:  06/04/2003  DATE OF DISCHARGE:  06/04/2003                                 DISCHARGE SUMMARY   DIAGNOSES AT TIME OF DISCHARGE:  1. Left brain transient ischemic attack.  2. Hypertension, poorly controlled.  3. History of transient ischemic attack.  4. Renal insufficiency.  5. Hypokalemia.  6. History of congestive heart failure.  7. Gout.   MEDICATIONS AT THE TIME OF DISCHARGE:  1. Indomethacin 1 daily.  2. Allopurinol 100 mg daily.  3. Adalat 90 mg daily.  4. Potassium 20 mEq 2 daily.  5. Clonidine 3 mg b.i.d.  6. Doxazosin 80 mg 1/2 tablet daily.  7. Lasix 40 mg daily.  8. Foltx 1 daily.  9. Ibuprofen 600 mg b.i.d.  10.      Aspirin 325 mg daily.  11.      Labetalol as prior to admission.   DIET:  Regular consistency, thin liquids, low salt.   STUDIES PERFORMED:  1. CT of the brain performed on admission showed a lacune in the mid-brain     and in the right posterior limb of the internal capsule.  Small vessel     disease.  No acute abnormality.  2. MRA of the brain is unremarkable except for a diseased right vertebral.     MRI of the brain shows extensive chronic small vessel disease involving     the cerebral white matter, thalami and pons.  No acute strokes.  3. Carotid Doppler is normal with no ICA stenosis and vertebral flow     antegrade bilaterally.  4. Two-D echocardiogram shows an ejection fraction of 55 to 65% with normal     LV function and no regional left ventricular wall abnormalities.  No     thrombus.  No stenosis or competence.  No embolic source.  5. EKG which shows sinus bradycardia, left axis deviation, left ventricular     hypertrophy,  inferior infarct (age undetermined), anteroseptal infarct     (age undetermined), T wave abnormality.  No significant change since last     tracing per Peter [place].   LABORATORY STUDIES:  UA negative.  CK 269, troponin and CK-MB normal.  CMP  with notably potassium at 3.0 and glucose 122, alkaline phosphatase 120.  Coagulation studies normal.  CBC normal with a hemoglobin of 13.4,  hematocrit 40.0, white blood cells 8.4, and platelets 263.  Liver function  tests within normal limits.  At the time of discharge, sed rate, CRP, drug  screen and TSH are all pending.  Lipids and homocysteine not drawn during  hospitalization.   HISTORY OF  PRESENT ILLNESS:  Frank Mclean is a 70 year old right-handed  black male with a history of hypertension, gout, fluid retention,  questionable CHF, who was admitted at midnight with word-finding  difficulties and slurred speech.  He was noted to have some right facial  weakness, dysarthria but no aphasia.  CT was negative for acute infarction.  The patient was admitted for stroke evaluation.  He was not a t-PA  candidate.   HOSPITAL COURSE:  MRI was negative for acute infarction, but the patient  clearly had a left brain TIA with a history of a left brain TIA in December  of 2003.  Carotid Doppler and 2D echo were normal.  TIA was felt to be  related to poorly controlled hypertension.  He has been on multiple  hypertensive medicines in the past, and these have been adjusted with  control remaining difficult.  Plans are to return him to his primary  physician for continuation of blood pressure control.   Also recommend follow up of cholesterol and homocysteine as an outpatient.  The patient already on Foltx prior to admission.  Of note, the patient was  on folic acid too, which is also unclear, and Foltx and this was  discontinued during hospitalization.  We will start aspirin 325 mg for  stroke prevention.   CONDITION ON DISCHARGE:  The patient is  neurologically normal.  Alert and  oriented x 3.  Speech clear.  No aphasia.  Pupils equal and reactive to  light.  Extraocular movements intact.  Visual fields are full.  Chest is  clear to auscultation.  Heart rate is regular.  Strength is normal.  Sensation is intact.  DTRs are equal.  Plantars are down bilaterally.   PLAN:  1. Discharge home with wife.  2. Aspirin for stroke prevention.  3. Needs better outpatient blood pressure control.  We will send to primary     M.D. for further followup.  4. Follow up cholesterol and homocysteine as outpatient.  Check accordingly.     Statins are recommended for LDL greater than 100.  5. Potassium increased to 40 daily.     Annie Main, N.P.                         Deanna Artis. Sharene Skeans, M.D.    SB/MEDQ  D:  06/05/2003  T:  06/05/2003  Job:  [id number]   cc:   Deanna Artis. Sharene Skeans, M.D.  [address]  [address]  [place]  [place] [address]  Fax: [phone]    cc:   Deanna Artis. Sharene Skeans, M.D.  [address]  [address]  [place]  [place] [address]  Fax: [phone]

## 2011-05-12 ENCOUNTER — Other Ambulatory Visit: Payer: Self-pay | Admitting: Internal Medicine

## 2011-05-14 ENCOUNTER — Ambulatory Visit (INDEPENDENT_AMBULATORY_CARE_PROVIDER_SITE_OTHER): Payer: Medicare Other | Admitting: *Deleted

## 2011-05-14 DIAGNOSIS — I6350 Cerebral infarction due to unspecified occlusion or stenosis of unspecified cerebral artery: Secondary | ICD-10-CM

## 2011-05-19 NOTE — Discharge Summary (Signed)
  [name], Frank Mclean NO.:  [id number]  MEDICAL RECORD NO.:  [id number]           PATIENT TYPE:  I  LOCATION:  1443                         FACILITY:  [facility]  PHYSICIAN:  Mauro Kaufmann, MD         DATE OF BIRTH:  10-01-1941  DATE OF ADMISSION:  04/14/2011 DATE OF DISCHARGE:  04/16/2011                              DISCHARGE SUMMARY   ADMISSION DIAGNOSES: 1. Acute congestive heart failure. 2. Hypertension. 3. Diabetes mellitus. 4. Hyperlipidemia. 5. Long-term anticoagulation. 6. Hypokalemia. 7. Normocytic anemia.  DISCHARGE DIAGNOSES:  Include 1. Congestive heart failure. 2. Questionable coronary artery disease.  The patient to go for     cardiac cath at [facility]. 3. Hypertension. 4. Diabetes mellitus. 5. History of cerebrovascular accident.  TESTS PERFORMED IN THE HOSPITAL STAY:  Include chest x-ray showed stable cardiac enlargement and probable mild interstitial edema.  CONSULTS OBTAINED DURING HOSPITAL STAY:  Include Cardiology consultation.  BRIEF HISTORY AND PHYSICAL:  A 70 year old male with history of CVA in the past which was secondary to uncontrolled hypertension, on Coumadin, came to the hospital for shortness of breath for 5 days.  The patient was found to be in acute decompression of congestive heart failure, was started on IV Lasix.  A 2-D echo was ordered.  Also, Cardiology consultation was obtained.  BRIEF HOSPITAL COURSE:  The patient was seen by Cardiology and Cardiology recommended to continue with the Lasix.  His shortness of breath had improved.  There was questionable CAD, so a cardiac cath was planned and the patient was transferred to [facility] for further cardiac cath.  Hypertension.  [name] blood pressure was controlled.  He was continued on clonidine and nifedipine.  History of CVA.  The patient was off Coumadin until further cardiac catheterization and was continued on aspirin.  Further course as  per Cardiology and the patient was transferred to [facility] on April 16, 2011.     Mauro Kaufmann, MD     GL/MEDQ  D:  04/28/2011  T:  04/29/2011  Job:  [id number]  Electronically Signed by Mauro Kaufmann  on 05/19/2011 11:59:35 AM

## 2011-05-21 ENCOUNTER — Ambulatory Visit (INDEPENDENT_AMBULATORY_CARE_PROVIDER_SITE_OTHER): Payer: Medicare Other | Admitting: *Deleted

## 2011-05-21 DIAGNOSIS — I6350 Cerebral infarction due to unspecified occlusion or stenosis of unspecified cerebral artery: Secondary | ICD-10-CM

## 2011-05-21 LAB — POCT INR: INR: 1.4

## 2011-05-24 ENCOUNTER — Ambulatory Visit: Payer: Medicare Other | Admitting: Cardiology

## 2011-05-28 ENCOUNTER — Encounter: Payer: Medicare Other | Admitting: *Deleted

## 2011-06-09 NOTE — Cardiovascular Report (Signed)
  Frank Mclean, Frank Mclean NO.:  [id number]  MEDICAL RECORD NO.:  [id number]           PATIENT TYPE:  O  LOCATION:  3730                         FACILITY:  [facility]  PHYSICIAN:  Rollene Rotunda, MD, FACCDATE OF BIRTH:  1941/04/16  DATE OF PROCEDURE:  04/16/2011 DATE OF DISCHARGE:                           CARDIAC CATHETERIZATION   PRIMARY CARE DOCTOR:  Georgina Quint. Plotnikov, MD  PROCEDURE:  Right and left heart catheterization/coronary arteriography.  INDICATIONS:  Evaluate the patient with cardiomyopathy.  It is very difficult to control hypertension.  PROCEDURE:  Left heart catheterization was performed via the right femoral artery, right heart catheterization was performed via the right femoral vein.  Both vessels were cannulated using an anterior wall puncture.  A #5-French arterial sheath and a #7-French venous sheath were inserted via the Seldinger technique.  Preformed Judkins and pigtail catheter were utilized.  The patient tolerated the procedure well and left the lab in stable condition.  This sheath was in place at the time of discharge because he was very hypertensive throughout the procedure.  RESULTS:  Hemodynamics:  RA mean 9, RV 67/8, PA 42/13 with a mean of 28, pulmonary capillary wedge pressure mean 19, LV 184/18 with an end- diastolic of 38, aorta 188/101, cardiac output/cardiac index 5.5/2.43. Coronaries:  Left main was normal.  The LAD was large wrapping the apex. There were diffuse luminal irregularities.  There was long mid 25% stenosis.  First diagonal was moderate sized with long mid 30% stenosis. The circumflex including the AV groove had long 40% stenosis before the small posterolateral.  First obtuse marginal was very large and branching.  There was long proximal 25% stenosis.  Right coronary artery was dominant.  There was distal long 40% stenosis.  The PDA was small and normal.  The LV was not injected secondary to renal  insufficiency.  CONCLUSION:  Nonobstructive coronary artery disease.  Mildly elevated pulmonary pressures.  Elevated end-diastolic pressure.  PLAN:  The patient needs aggressive medical management of his hypertensive cardiomyopathy and primary risk reduction for nonobstructive coronary disease.     Rollene Rotunda, MD, [facility]    JH/MEDQ  D:  04/16/2011  T:  04/17/2011  Job:  [id number]  cc:   Georgina Quint. Plotnikov, MD  Electronically Signed by Rollene Rotunda MD [facility] on 06/09/2011 11:37:53 AM

## 2011-06-14 ENCOUNTER — Encounter: Payer: Self-pay | Admitting: Internal Medicine

## 2011-06-14 ENCOUNTER — Ambulatory Visit (INDEPENDENT_AMBULATORY_CARE_PROVIDER_SITE_OTHER): Payer: Medicare Other | Admitting: Internal Medicine

## 2011-06-14 ENCOUNTER — Ambulatory Visit (INDEPENDENT_AMBULATORY_CARE_PROVIDER_SITE_OTHER)
Admission: RE | Admit: 2011-06-14 | Discharge: 2011-06-14 | Disposition: A | Discharge status: Home / Self Care | Payer: Medicare Other | Source: Ambulatory Visit | Attending: Internal Medicine | Admitting: Internal Medicine

## 2011-06-14 VITALS — BP 126/82 | HR 88 | Temp 96.8°F | Resp 16 | Wt 222.0 lb

## 2011-06-14 DIAGNOSIS — I509 Heart failure, unspecified: Secondary | ICD-10-CM

## 2011-06-14 DIAGNOSIS — M25579 Pain in unspecified ankle and joints of unspecified foot: Secondary | ICD-10-CM

## 2011-06-14 DIAGNOSIS — M25572 Pain in left ankle and joints of left foot: Secondary | ICD-10-CM | POA: Insufficient documentation

## 2011-06-14 DIAGNOSIS — N259 Disorder resulting from impaired renal tubular function, unspecified: Secondary | ICD-10-CM

## 2011-06-14 MED ORDER — MELOXICAM 7.5 MG PO TABS: 7.5000 mg | ORAL_TABLET | Freq: Every day | ORAL | Status: DC

## 2011-06-14 MED ORDER — METHYLPREDNISOLONE ACETATE 80 MG/ML IJ SUSP
120.0000 mg | Freq: Once | INTRAMUSCULAR | Status: AC
  Administered 2011-06-14: 120 mg via INTRAMUSCULAR

## 2011-06-14 NOTE — Progress Notes (Signed)
  Subjective:    Patient ID: Frank Mclean, male    DOB: 1941-09-29, 70 y.o.   MRN: [id number]  HPI  C/o L foot pain x 3 d - unable to bear wt on it - hurts across the top. Better with a boot on.  Review of Systems  Constitutional: Positive for fatigue. Negative for fever and appetite change.  Respiratory: Negative for chest tightness.   Cardiovascular: Negative for leg swelling.  Musculoskeletal: Positive for joint swelling and gait problem. Negative for myalgias.  Neurological: Negative for dizziness.  Psychiatric/Behavioral: Negative for confusion. The patient is not nervous/anxious.        Objective:   Physical Exam  Constitutional: He is oriented to person, place, and time. He appears well-developed.  HENT:  Mouth/Throat: Oropharynx is clear and moist.  Eyes: Conjunctivae are normal. Pupils are equal, round, and reactive to light.  Neck: Normal range of motion. No JVD present. No thyromegaly present.  Cardiovascular: Normal rate, regular rhythm, normal heart sounds and intact distal pulses.  Exam reveals no gallop and no friction rub.   No murmur heard. Pulmonary/Chest: Effort normal and breath sounds normal. No respiratory distress. He has no wheezes. He has no rales. He exhibits no tenderness.  Abdominal: Soft. Bowel sounds are normal. He exhibits no distension and no mass. There is no tenderness. There is no rebound and no guarding.  Musculoskeletal: Normal range of motion. He exhibits edema and tenderness.       L ankle and midfoot are swollen and tender  Lymphadenopathy:    He has no cervical adenopathy.  Neurological: He is alert and oriented to person, place, and time. He has normal reflexes. No cranial nerve deficit. He exhibits normal muscle tone. Coordination normal.  Skin: Skin is warm and dry. No rash noted.  Psychiatric: He has a normal mood and affect. His behavior is normal. Judgment and thought content normal.          Assessment & Plan:

## 2011-06-14 NOTE — Patient Instructions (Signed)
Elevate L foot

## 2011-06-14 NOTE — Assessment & Plan Note (Signed)
X ray ACE wrap 

## 2011-06-15 ENCOUNTER — Ambulatory Visit (INDEPENDENT_AMBULATORY_CARE_PROVIDER_SITE_OTHER): Payer: Medicare Other | Admitting: *Deleted

## 2011-06-15 DIAGNOSIS — I6350 Cerebral infarction due to unspecified occlusion or stenosis of unspecified cerebral artery: Secondary | ICD-10-CM

## 2011-06-15 LAB — POCT INR: INR: 1.2

## 2011-06-15 MED ORDER — WARFARIN SODIUM 5 MG PO TABS: ORAL_TABLET | ORAL | Status: DC

## 2011-06-16 ENCOUNTER — Telehealth: Payer: Self-pay | Admitting: Internal Medicine

## 2011-06-16 NOTE — Telephone Encounter (Signed)
Pts wife informed.

## 2011-06-16 NOTE — Telephone Encounter (Signed)
No fx  - pls inform Thx

## 2011-06-19 ENCOUNTER — Encounter: Payer: Self-pay | Admitting: Internal Medicine

## 2011-06-19 NOTE — Assessment & Plan Note (Signed)
Cont Rx - compensated

## 2011-06-19 NOTE — Assessment & Plan Note (Signed)
Will use meds w/caution

## 2011-06-24 ENCOUNTER — Ambulatory Visit (INDEPENDENT_AMBULATORY_CARE_PROVIDER_SITE_OTHER): Payer: Medicare Other | Admitting: *Deleted

## 2011-06-24 DIAGNOSIS — I6350 Cerebral infarction due to unspecified occlusion or stenosis of unspecified cerebral artery: Secondary | ICD-10-CM

## 2011-06-24 LAB — POCT INR: INR: 3.2

## 2011-06-25 ENCOUNTER — Telehealth: Payer: Self-pay | Admitting: *Deleted

## 2011-06-25 ENCOUNTER — Ambulatory Visit: Payer: Medicare Other | Admitting: Internal Medicine

## 2011-06-25 NOTE — Telephone Encounter (Signed)
rec Rf req for Hydroco/APAP 5-500. 1 po qid prn..... # 90. Last filled 11-24-10. Ok To Rf?

## 2011-06-25 NOTE — Telephone Encounter (Signed)
OK to fill this prescription with additional refills x1 Thank you!  

## 2011-06-28 MED ORDER — HYDROCODONE-ACETAMINOPHEN 5-500 MG PO TABS: 1.0000 | ORAL_TABLET | Freq: Four times a day (QID) | ORAL | Status: DC | PRN

## 2011-07-08 ENCOUNTER — Other Ambulatory Visit: Payer: Self-pay | Admitting: Internal Medicine

## 2011-07-08 ENCOUNTER — Ambulatory Visit (INDEPENDENT_AMBULATORY_CARE_PROVIDER_SITE_OTHER): Payer: Medicare Other | Admitting: *Deleted

## 2011-07-08 DIAGNOSIS — I6350 Cerebral infarction due to unspecified occlusion or stenosis of unspecified cerebral artery: Secondary | ICD-10-CM

## 2011-07-08 LAB — POCT INR: INR: 3.7

## 2011-07-12 ENCOUNTER — Ambulatory Visit: Payer: Medicare Other | Admitting: Cardiology

## 2011-07-12 ENCOUNTER — Ambulatory Visit (INDEPENDENT_AMBULATORY_CARE_PROVIDER_SITE_OTHER): Payer: Medicare Other | Admitting: Cardiology

## 2011-07-12 ENCOUNTER — Encounter: Payer: Self-pay | Admitting: Cardiology

## 2011-07-12 DIAGNOSIS — N259 Disorder resulting from impaired renal tubular function, unspecified: Secondary | ICD-10-CM

## 2011-07-12 DIAGNOSIS — I10 Essential (primary) hypertension: Secondary | ICD-10-CM

## 2011-07-12 DIAGNOSIS — I509 Heart failure, unspecified: Secondary | ICD-10-CM

## 2011-07-12 DIAGNOSIS — I2510 Atherosclerotic heart disease of native coronary artery without angina pectoris: Secondary | ICD-10-CM

## 2011-07-12 NOTE — Assessment & Plan Note (Signed)
The blood pressure is at target. No change in medications is indicated. We will continue with therapeutic lifestyle changes (TLC).  

## 2011-07-12 NOTE — Assessment & Plan Note (Signed)
His labs in May were OK. No change in therapy is indicated.

## 2011-07-12 NOTE — Assessment & Plan Note (Signed)
He seems to be euvolemic.  At this point, no change in therapy is indicated.  We have reviewed salt and fluid restrictions.  No further cardiovascular testing is indicated.   

## 2011-07-12 NOTE — Progress Notes (Signed)
HPI The patient presents for evaluation of cardiomyopathy after a hospitalization earlier this year. 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  At the last visit I increased his hydralazine and Imdur. Blood pressures have been better controlled. He thinks he is breathing better and feels better. He is doing household chores without dyspnea. He has no PND or orthopnea. He denies chest pressure, neck or arm discomfort. He said no weight gain or edema.  Allergies  Allergen Reactions  . Ace Inhibitors     REACTION: cough    Current Outpatient Prescriptions  Medication Sig Dispense Refill  . allopurinol (ZYLOPRIM) 300 MG tablet Take 300 mg by mouth daily.        [name] amLODipine (NORVASC) 10 MG tablet       . atorvastatin (LIPITOR) 20 MG tablet Take 20 mg by mouth daily.        . Cholecalciferol 1000 UNITS tablet Take 1,000 Units by mouth daily.        . cloNIDine (CATAPRES) 0.2 MG tablet Take 0.2 mg by mouth 3 (three) times daily.        . cyanocobalamin 1000 MCG tablet Take 100 mcg by mouth daily.        [name] doxazosin (CARDURA) 1 MG tablet TAKE 1 TABLET EVERY DAY  30 tablet  2  . furosemide (LASIX) 40 MG tablet TAKE 1 TABLET EVERY DAY  30 tablet  5  . hydrALAZINE (APRESOLINE) 50 MG tablet Take one and 1/2 tablets three times a day  135 tablet  11  . isosorbide mononitrate (IMDUR) 120 MG 24 hr tablet Take 1 tablet (120 mg total) by mouth every morning.  30 tablet  11  . labetalol (NORMODYNE) 200 MG tablet Take 200 mg by mouth 2 (two) times daily.        . meloxicam (MOBIC) 7.5 MG tablet Take 1 tablet (7.5 mg total) by mouth daily.  30 tablet  1  . metformin (FORTAMET) 500 MG (OSM) 24 hr tablet Take 500 mg by mouth daily with breakfast.        . NITROSTAT 0.4 MG SL tablet       . potassium chloride SA (K-DUR,KLOR-CON) 20 MEQ tablet Take 20 mEq by mouth 2 (two) times daily.        [name] triamcinolone (KENALOG) 0.5 % cream APPLY TO AFFECTED AREA  TWICE DAILY  270 g  0  . warfarin (COUMADIN) 5 MG tablet Take as directed by Anticoagulation clinic   60 tablet  3  . budesonide-formoterol (SYMBICORT) 160-4.5 MCG/ACT inhaler Inhale 2 puffs into the lungs 2 (two) times daily.  1 Inhaler  3  . Cholecalciferol 5000 UNITS capsule Take 5,000 Units by mouth daily.        . clotrimazole-betamethasone (LOTRISONE) cream Apply topically 2 (two) times daily as needed.        [name] HYDROcodone-acetaminophen (VICODIN) 5-500 MG per tablet Take 1 tablet by mouth 4 (four) times daily as needed.  90 tablet  1  . Insulin Syringe-Needle U-100 (B-D INS SYR MICROFINE 1CC/27G) 27G X 5/8" 1 ML MISC as directed.          Past Medical History  Diagnosis Date  . Gout   . HTN (hypertension)   . Renal insufficiency   . Hyperlipidemia   . Osteoarthritis   . History of CVA (cerebrovascular accident)   . GERD (gastroesophageal reflux disease)   . DJD (degenerative joint disease)   .  CHF (congestive heart failure)     35 - 40% Echo 4/12  . BPH (benign prostatic hypertrophy)   . Type II or unspecified type diabetes mellitus without mention of complication, not stated as uncontrolled   . CAD (coronary artery disease)     Non obstructive    Past Surgical History  Procedure Date  . None     ROS:  As stated in the HPI and negative for all other systems.  PHYSICAL EXAM BP 133/79  Pulse 65  Ht 6' 2.5" (1.892 m)  Wt 218 lb (98.884 kg)  BMI 27.62 kg/m2 GENERAL:  Well appearing NECK:  No jugular venous distention, waveform within normal limits, carotid upstroke brisk and symmetric, no bruits, no thyromegaly LYMPHATICS:  No cervical, inguinal adenopathy LUNGS:  Clear to auscultation bilaterally BACK:  No CVA tenderness CHEST:  Unremarkable HEART:  PMI not displaced or sustained,S1 and S2 within normal limits, no S3, no S4, no clicks, no rubs, no murmurs ABD:  Flat, positive bowel sounds normal in frequency in pitch, no bruits, no rebound, no guarding, no midline  pulsatile mass, no hepatomegaly, no splenomegaly EXT:  2 plus pulses throughout, no edema, no cyanosis no clubbing SKIN:  No rashes no nodules, slight wound right anterior tibial NEURO:  Cranial nerves II through XII grossly intact, motor grossly intact throughout PSYCH:  Cognitively intact, oriented to person place and time  ASSESSMENT AND PLAN

## 2011-07-12 NOTE — Patient Instructions (Signed)
Follow up in 6 months with Dr Hochrein.  You will receive a letter in the mail 2 months before you are due.  Please call us when you receive this letter to schedule your follow up appointment.   The current medical regimen is effective;  continue present plan and medications.  

## 2011-07-20 ENCOUNTER — Other Ambulatory Visit: Payer: Self-pay | Admitting: Internal Medicine

## 2011-07-22 ENCOUNTER — Ambulatory Visit (INDEPENDENT_AMBULATORY_CARE_PROVIDER_SITE_OTHER): Payer: Medicare Other | Admitting: *Deleted

## 2011-07-22 DIAGNOSIS — I6350 Cerebral infarction due to unspecified occlusion or stenosis of unspecified cerebral artery: Secondary | ICD-10-CM

## 2011-07-22 LAB — POCT INR: INR: 2.2

## 2011-07-30 ENCOUNTER — Other Ambulatory Visit: Payer: Self-pay | Admitting: *Deleted

## 2011-07-30 MED ORDER — METFORMIN HCL ER (OSM) 500 MG PO TB24: 500.0000 mg | ORAL_TABLET | Freq: Every day | ORAL | Status: DC

## 2011-08-04 ENCOUNTER — Ambulatory Visit: Payer: Medicare Other | Admitting: Internal Medicine

## 2011-08-05 ENCOUNTER — Encounter: Payer: Medicare Other | Admitting: *Deleted

## 2011-08-27 ENCOUNTER — Ambulatory Visit (INDEPENDENT_AMBULATORY_CARE_PROVIDER_SITE_OTHER): Payer: Medicare Other | Admitting: *Deleted

## 2011-08-27 DIAGNOSIS — I6350 Cerebral infarction due to unspecified occlusion or stenosis of unspecified cerebral artery: Secondary | ICD-10-CM

## 2011-09-04 ENCOUNTER — Other Ambulatory Visit: Payer: Self-pay | Admitting: Internal Medicine

## 2011-09-10 ENCOUNTER — Encounter: Payer: Medicare Other | Admitting: *Deleted

## 2011-09-18 ENCOUNTER — Other Ambulatory Visit: Payer: Self-pay | Admitting: Internal Medicine

## 2011-09-20 ENCOUNTER — Ambulatory Visit (INDEPENDENT_AMBULATORY_CARE_PROVIDER_SITE_OTHER): Payer: Medicare Other | Admitting: *Deleted

## 2011-09-20 DIAGNOSIS — I6350 Cerebral infarction due to unspecified occlusion or stenosis of unspecified cerebral artery: Secondary | ICD-10-CM

## 2011-09-20 LAB — POCT INR: INR: 2.2

## 2011-10-01 LAB — URINALYSIS, ROUTINE W REFLEX MICROSCOPIC
Ketones, ur: NEGATIVE
Leukocytes, UA: NEGATIVE
Nitrite: NEGATIVE
Protein, ur: 30 — AB

## 2011-10-01 LAB — URINE MICROSCOPIC-ADD ON

## 2011-10-01 LAB — APTT: aPTT: 44 — ABNORMAL HIGH

## 2011-10-04 ENCOUNTER — Encounter: Payer: Medicare Other | Admitting: *Deleted

## 2011-10-07 ENCOUNTER — Ambulatory Visit (INDEPENDENT_AMBULATORY_CARE_PROVIDER_SITE_OTHER): Payer: Medicare Other | Admitting: *Deleted

## 2011-10-07 DIAGNOSIS — I6350 Cerebral infarction due to unspecified occlusion or stenosis of unspecified cerebral artery: Secondary | ICD-10-CM

## 2011-10-07 DIAGNOSIS — Z7901 Long term (current) use of anticoagulants: Secondary | ICD-10-CM

## 2011-10-07 LAB — POCT INR: INR: 2.6

## 2011-10-14 ENCOUNTER — Ambulatory Visit (INDEPENDENT_AMBULATORY_CARE_PROVIDER_SITE_OTHER): Payer: Medicare Other | Admitting: Internal Medicine

## 2011-10-14 ENCOUNTER — Encounter: Payer: Self-pay | Admitting: Internal Medicine

## 2011-10-14 VITALS — BP 170/108 | HR 76 | Temp 97.1°F | Resp 16 | Wt 224.0 lb

## 2011-10-14 DIAGNOSIS — E538 Deficiency of other specified B group vitamins: Secondary | ICD-10-CM

## 2011-10-14 DIAGNOSIS — I6350 Cerebral infarction due to unspecified occlusion or stenosis of unspecified cerebral artery: Secondary | ICD-10-CM

## 2011-10-14 DIAGNOSIS — E785 Hyperlipidemia, unspecified: Secondary | ICD-10-CM

## 2011-10-14 DIAGNOSIS — R21 Rash and other nonspecific skin eruption: Secondary | ICD-10-CM

## 2011-10-14 DIAGNOSIS — M109 Gout, unspecified: Secondary | ICD-10-CM

## 2011-10-14 DIAGNOSIS — E119 Type 2 diabetes mellitus without complications: Secondary | ICD-10-CM

## 2011-10-14 MED ORDER — METHYLPREDNISOLONE ACETATE 80 MG/ML IJ SUSP
120.0000 mg | Freq: Once | INTRAMUSCULAR | Status: AC
  Administered 2011-10-14: 120 mg via INTRAMUSCULAR

## 2011-10-14 NOTE — Assessment & Plan Note (Signed)
Risks associated with HTN treatment noncompliance were discussed. Compliance was encouraged. Continue with current prescription therapy as reflected on the Med list.

## 2011-10-14 NOTE — Assessment & Plan Note (Signed)
Continue with current prescription therapy as reflected on the Med list.  

## 2011-10-14 NOTE — Progress Notes (Signed)
  Subjective:    Patient ID: Frank Mclean, male    DOB: [birth year], 70 y.o.   MRN: [id number]  HPI  The patient presents for a follow-up of  chronic hypertension, chronic dyslipidemia, CAD controlled with medicines C/o rash; per Dr Emily Filbert fungal and bact infection on back and poison ivy on R arm; he is using an antibiotic (?cephalexin) and two creams. The rash is 50% better, however he is c/o new rash on L forearm and B shins.  BP is ok at home per pt    Review of Systems  Constitutional: Positive for fatigue. Negative for appetite change and unexpected weight change.  HENT: Negative for nosebleeds, congestion, sore throat, sneezing, trouble swallowing and neck pain.   Eyes: Negative for itching and visual disturbance.  Respiratory: Negative for cough.   Cardiovascular: Negative for chest pain, palpitations and leg swelling.  Gastrointestinal: Negative for nausea, diarrhea, blood in stool and abdominal distention.  Genitourinary: Negative for frequency and hematuria.  Musculoskeletal: Negative for back pain, joint swelling and gait problem.  Skin: Positive for rash.  Neurological: Negative for dizziness, tremors, speech difficulty and weakness.  Psychiatric/Behavioral: Negative for sleep disturbance, dysphoric mood and agitation. The patient is not nervous/anxious.        Objective:   Physical Exam  Constitutional: He is oriented to person, place, and time. He appears well-developed.  HENT:  Mouth/Throat: Oropharynx is clear and moist.  Eyes: Conjunctivae are normal. Pupils are equal, round, and reactive to light.  Neck: Normal range of motion. No JVD present. No thyromegaly present.  Cardiovascular: Normal rate, regular rhythm, normal heart sounds and intact distal pulses.  Exam reveals no gallop and no friction rub.   No murmur heard. Pulmonary/Chest: Effort normal and breath sounds normal. No respiratory distress. He has no wheezes. He has no rales. He exhibits no tenderness.    Abdominal: Soft. Bowel sounds are normal. He exhibits no distension and no mass. There is no tenderness. There is no rebound and no guarding.  Musculoskeletal: Normal range of motion. He exhibits no edema and no tenderness.  Lymphadenopathy:    He has no cervical adenopathy.  Neurological: He is alert and oriented to person, place, and time. He has normal reflexes. No cranial nerve deficit. He exhibits normal muscle tone. Coordination normal.  Skin: Skin is warm and dry. Rash noted.       Eryth papular rash on his back and R arm is resolving. New rash on L forearm and B shins - extensive  Psychiatric: He has a normal mood and affect. His behavior is normal. Judgment and thought content normal.          Assessment & Plan:

## 2011-10-14 NOTE — Assessment & Plan Note (Addendum)
10/12 s/p derm eval by Dr Emily Filbert. Some rash is better, however a new rash has appeared on his L arm and B legs He is on abx and creams.  Continue with current prescription therapy per Dr Emily Filbert.   Possible allergy to allopurinol - I'm not sure about his rash origin - may need to d/c Allopurinol  Depomedrol 120 mg IM

## 2011-10-14 NOTE — Assessment & Plan Note (Signed)
Continue with current prescription therapy as reflected on the Med list. Possible allergy to allopurinol - I'm not sure about his rash origin - may need to d/c

## 2011-10-22 ENCOUNTER — Other Ambulatory Visit: Payer: Self-pay | Admitting: Cardiology

## 2011-10-22 NOTE — Telephone Encounter (Signed)
[facility]

## 2011-11-04 ENCOUNTER — Ambulatory Visit (INDEPENDENT_AMBULATORY_CARE_PROVIDER_SITE_OTHER): Payer: Medicare Other | Admitting: *Deleted

## 2011-11-04 DIAGNOSIS — Z7901 Long term (current) use of anticoagulants: Secondary | ICD-10-CM

## 2011-11-04 DIAGNOSIS — I6350 Cerebral infarction due to unspecified occlusion or stenosis of unspecified cerebral artery: Secondary | ICD-10-CM

## 2011-11-08 ENCOUNTER — Other Ambulatory Visit: Payer: Self-pay | Admitting: Internal Medicine

## 2011-11-12 ENCOUNTER — Telehealth: Payer: Self-pay | Admitting: *Deleted

## 2011-11-12 NOTE — Telephone Encounter (Signed)
Pts wife called, she states pt has been having blood in his urine, with burning pt has been going frequently more than 4 times per day. Pt is on lasix. But states the blood and burning are out of the norm.  Pt denies fever. Please Advise.

## 2011-11-15 NOTE — Telephone Encounter (Signed)
UA OV w/any MD Thx

## 2011-11-15 NOTE — Telephone Encounter (Signed)
lmoam for pt to sch appt

## 2011-11-18 ENCOUNTER — Ambulatory Visit (INDEPENDENT_AMBULATORY_CARE_PROVIDER_SITE_OTHER): Payer: Medicare Other | Admitting: Internal Medicine

## 2011-11-18 ENCOUNTER — Other Ambulatory Visit (INDEPENDENT_AMBULATORY_CARE_PROVIDER_SITE_OTHER): Payer: Medicare Other

## 2011-11-18 ENCOUNTER — Encounter: Payer: Self-pay | Admitting: Internal Medicine

## 2011-11-18 ENCOUNTER — Ambulatory Visit (INDEPENDENT_AMBULATORY_CARE_PROVIDER_SITE_OTHER): Payer: Medicare Other | Admitting: *Deleted

## 2011-11-18 ENCOUNTER — Telehealth: Payer: Self-pay | Admitting: Internal Medicine

## 2011-11-18 VITALS — BP 142/90 | HR 67 | Temp 98.7°F | Resp 16 | Ht 74.0 in | Wt 221.0 lb

## 2011-11-18 DIAGNOSIS — I6350 Cerebral infarction due to unspecified occlusion or stenosis of unspecified cerebral artery: Secondary | ICD-10-CM

## 2011-11-18 DIAGNOSIS — M109 Gout, unspecified: Secondary | ICD-10-CM

## 2011-11-18 DIAGNOSIS — I10 Essential (primary) hypertension: Secondary | ICD-10-CM

## 2011-11-18 DIAGNOSIS — E119 Type 2 diabetes mellitus without complications: Secondary | ICD-10-CM

## 2011-11-18 DIAGNOSIS — N320 Bladder-neck obstruction: Secondary | ICD-10-CM

## 2011-11-18 DIAGNOSIS — Z7901 Long term (current) use of anticoagulants: Secondary | ICD-10-CM

## 2011-11-18 DIAGNOSIS — R21 Rash and other nonspecific skin eruption: Secondary | ICD-10-CM

## 2011-11-18 LAB — CBC WITH DIFFERENTIAL/PLATELET
Basophils Absolute: 0 10*3/uL (ref 0.0–0.1)
Eosinophils Absolute: 0.1 10*3/uL (ref 0.0–0.7)
HCT: 39.3 % (ref 39.0–52.0)
Hemoglobin: 12.9 g/dL — ABNORMAL LOW (ref 13.0–17.0)
Lymphocytes Relative: 12.6 % (ref 12.0–46.0)
Lymphs Abs: 1.4 10*3/uL (ref 0.7–4.0)
MCHC: 32.9 g/dL (ref 30.0–36.0)
Monocytes Absolute: 0.5 10*3/uL (ref 0.1–1.0)
Neutro Abs: 9 10*3/uL — ABNORMAL HIGH (ref 1.4–7.7)
RDW: 15.2 % — ABNORMAL HIGH (ref 11.5–14.6)

## 2011-11-18 LAB — HEMOGLOBIN A1C: Hgb A1c MFr Bld: 6.9 % — ABNORMAL HIGH (ref 4.6–6.5)

## 2011-11-18 LAB — LIPID PANEL
HDL: 61.1 mg/dL (ref >39.00)
Total CHOL/HDL Ratio: 3
Triglycerides: 117 mg/dL (ref 0.0–149.0)

## 2011-11-18 LAB — POCT INR: INR: 2.1

## 2011-11-18 LAB — BASIC METABOLIC PANEL
Calcium: 9 mg/dL (ref 8.4–10.5)
Creatinine, Ser: 1.6 mg/dL — ABNORMAL HIGH (ref 0.4–1.5)
GFR: 54.84 mL/min — ABNORMAL LOW (ref >60.00)

## 2011-11-18 NOTE — Telephone Encounter (Signed)
Pt informed

## 2011-11-18 NOTE — Assessment & Plan Note (Signed)
Continue with current prescription therapy as reflected on the Med list. BP Readings from Last 3 Encounters:  11/18/11 142/90  10/14/11 170/108  07/12/11 133/79  BP is ok at home

## 2011-11-18 NOTE — Assessment & Plan Note (Signed)
Continue with current prescription therapy as reflected on the Med list.  

## 2011-11-18 NOTE — Telephone Encounter (Signed)
Labs ok Thx

## 2011-11-18 NOTE — Assessment & Plan Note (Signed)
Doing well 

## 2011-11-18 NOTE — Progress Notes (Signed)
Subjective:    Patient ID: Frank Mclean, male    DOB: April 09, 1941, 70 y.o.   MRN: [id number]  HPI  The patient presents for a follow-up of  chronic hypertension, chronic dyslipidemia, type 2 diabetes, rash - resolved, controlled with medicines    Review of Systems  Constitutional: Positive for fatigue. Negative for appetite change and unexpected weight change.  HENT: Negative for nosebleeds, congestion, sore throat, sneezing, trouble swallowing and neck pain.   Eyes: Negative for itching and visual disturbance.  Respiratory: Negative for cough.   Cardiovascular: Negative for chest pain, palpitations and leg swelling.  Gastrointestinal: Negative for nausea, diarrhea, blood in stool and abdominal distention.  Genitourinary: Negative for frequency and hematuria.  Musculoskeletal: Positive for arthralgias. Negative for back pain, joint swelling and gait problem.  Skin: Negative for rash.  Neurological: Negative for dizziness, tremors, speech difficulty and weakness.  Psychiatric/Behavioral: Negative for sleep disturbance, dysphoric mood and agitation. The patient is nervous/anxious.        Objective:   Physical Exam  Constitutional: He is oriented to person, place, and time. He appears well-developed.  HENT:  Mouth/Throat: Oropharynx is clear and moist.  Eyes: Conjunctivae are normal. Pupils are equal, round, and reactive to light.  Neck: Normal range of motion. No JVD present. No thyromegaly present.  Cardiovascular: Normal rate, regular rhythm, normal heart sounds and intact distal pulses.  Exam reveals no gallop and no friction rub.   No murmur heard. Pulmonary/Chest: Effort normal and breath sounds normal. No respiratory distress. He has no wheezes. He has no rales. He exhibits no tenderness.  Abdominal: Soft. Bowel sounds are normal. He exhibits no distension and no mass. There is no tenderness. There is no rebound and no guarding.  Musculoskeletal: Normal range of motion. He  exhibits no edema and no tenderness.  Lymphadenopathy:    He has no cervical adenopathy.  Neurological: He is alert and oriented to person, place, and time. He has normal reflexes. No cranial nerve deficit. He exhibits normal muscle tone. Coordination normal.  Skin: Skin is warm and dry. No rash noted.  Psychiatric: He has a normal mood and affect. His behavior is normal. Judgment and thought content normal.    Lab Results  Component Value Date   WBC 11.8* 04/17/2011   HGB 10.7* 04/17/2011   HCT 33.5* 04/17/2011   PLT 272 04/17/2011   GLUCOSE 163* 04/20/2011   CHOL  Value: 125        ATP III CLASSIFICATION:  <200     mg/dL   Desirable  244-010  mg/dL   Borderline High  >=272    mg/dL   High        5/36/6440   TRIG 79 04/15/2011   HDL 38* 04/15/2011   LDLDIRECT 177.9 06/17/2010   LDLCALC  Value: 71        Total Cholesterol/HDL:CHD Risk Coronary Heart Disease Risk Table                     Men   Women  1/2 Average Risk   3.4   3.3  Average Risk       5.0   4.4  2 X Average Risk   9.6   7.1  3 X Average Risk  23.4   11.0        Use the calculated Patient Ratio above and the CHD Risk Table to determine the patient's CHD Risk.        ATP III CLASSIFICATION (  LDL):  <100     mg/dL   Optimal  161-096  mg/dL   Near or Above                    Optimal  130-159  mg/dL   Borderline  045-409  mg/dL   High  >811     mg/dL   Very High 09/02/7828   ALT 12 04/15/2011   AST 13 04/15/2011   NA 140 04/20/2011   K 4.3 04/20/2011   CL 107 04/20/2011   CREATININE 1.45 04/20/2011   BUN 18 04/20/2011   CO2 25 04/20/2011   TSH 2.227 04/14/2011   PSA 0.00* 06/17/2010   INR 2.1 11/18/2011   HGBA1C  Value: 7.4 (NOTE)                                                                       According to the ADA Clinical Practice Recommendations for 2011, when HbA1c is used as a screening test:   >=6.5%   Diagnostic of Diabetes Mellitus           (if abnormal result  is confirmed)  5.7-6.4%   Increased risk of developing Diabetes Mellitus   References:Diagnosis and Classification of Diabetes Mellitus,Diabetes Care,2011,34(Suppl 1):S62-S69 and Standards of Medical Care in         Diabetes - 2011,Diabetes Care,2011,34  (Suppl 1):S11-S61.* 04/14/2011         Assessment & Plan:

## 2011-11-18 NOTE — Assessment & Plan Note (Signed)
Resolved

## 2011-12-16 ENCOUNTER — Ambulatory Visit (INDEPENDENT_AMBULATORY_CARE_PROVIDER_SITE_OTHER): Payer: Medicare Other | Admitting: *Deleted

## 2011-12-16 DIAGNOSIS — I6350 Cerebral infarction due to unspecified occlusion or stenosis of unspecified cerebral artery: Secondary | ICD-10-CM

## 2011-12-16 DIAGNOSIS — Z7901 Long term (current) use of anticoagulants: Secondary | ICD-10-CM

## 2012-01-10 ENCOUNTER — Other Ambulatory Visit: Payer: Self-pay | Admitting: *Deleted

## 2012-01-10 MED ORDER — POTASSIUM CHLORIDE CRYS ER 20 MEQ PO TBCR: 20.0000 meq | EXTENDED_RELEASE_TABLET | Freq: Two times a day (BID) | ORAL | Status: DC

## 2012-01-13 ENCOUNTER — Encounter: Payer: Medicare Other | Admitting: *Deleted

## 2012-01-20 ENCOUNTER — Ambulatory Visit (INDEPENDENT_AMBULATORY_CARE_PROVIDER_SITE_OTHER): Payer: Medicare Other | Admitting: Pharmacist

## 2012-01-20 ENCOUNTER — Encounter: Payer: Self-pay | Admitting: Cardiology

## 2012-01-20 ENCOUNTER — Ambulatory Visit (INDEPENDENT_AMBULATORY_CARE_PROVIDER_SITE_OTHER): Payer: Medicare Other | Admitting: Cardiology

## 2012-01-20 DIAGNOSIS — I10 Essential (primary) hypertension: Secondary | ICD-10-CM | POA: Diagnosis not present

## 2012-01-20 DIAGNOSIS — I6350 Cerebral infarction due to unspecified occlusion or stenosis of unspecified cerebral artery: Secondary | ICD-10-CM

## 2012-01-20 DIAGNOSIS — I509 Heart failure, unspecified: Secondary | ICD-10-CM | POA: Diagnosis not present

## 2012-01-20 DIAGNOSIS — Z7901 Long term (current) use of anticoagulants: Secondary | ICD-10-CM

## 2012-01-20 LAB — POCT INR: INR: 1.1

## 2012-01-20 NOTE — Progress Notes (Signed)
HPI The patient presents for evaluation of cardiomyopathy after a hospitalization last year. 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 Since I last saw him he has done well. He has had none of the shortness of breath he had previously. He says his blood pressure is well controlled at home with systolics less than 140. He denies any chest pressure, neck or arm discomfort. He denies any palpitations, presyncope or syncope. He has no PND or orthopnea. He is not exercising as much as I would like however.  Allergies  Allergen Reactions  . Ace Inhibitors     REACTION: cough    Current Outpatient Prescriptions  Medication Sig Dispense Refill  . allopurinol (ZYLOPRIM) 300 MG tablet Take 300 mg by mouth daily.        [name] amLODipine (NORVASC) 10 MG tablet       . atorvastatin (LIPITOR) 20 MG tablet TAKE 1 TABLET EVERY DAY FOR CHOLESTEROL  30 tablet  7  . budesonide-formoterol (SYMBICORT) 160-4.5 MCG/ACT inhaler Inhale 2 puffs into the lungs 2 (two) times daily.  1 Inhaler  3  . Cholecalciferol 1000 UNITS tablet Take 1,000 Units by mouth daily.        . Cholecalciferol 5000 UNITS capsule Take 5,000 Units by mouth daily.        . cloNIDine (CATAPRES) 0.2 MG tablet Take 0.2 mg by mouth 3 (three) times daily.        . clotrimazole-betamethasone (LOTRISONE) cream Apply topically 2 (two) times daily as needed.        . cyanocobalamin 1000 MCG tablet Take 100 mcg by mouth daily.        [name] doxazosin (CARDURA) 1 MG tablet TAKE 1 TABLET EVERY DAY  30 tablet  2  . furosemide (LASIX) 40 MG tablet TAKE 1 TABLET EVERY DAY  30 tablet  5  . hydrALAZINE (APRESOLINE) 50 MG tablet Take one and 1/2 tablets three times a day  135 tablet  11  . HYDROcodone-acetaminophen (VICODIN) 5-500 MG per tablet Take 1 tablet by mouth 4 (four) times daily as needed.  90 tablet  1  . Insulin Syringe-Needle U-100 (B-D INS SYR MICROFINE 1CC/27G) 27G X 5/8" 1 ML MISC as  directed.        . isosorbide mononitrate (IMDUR) 120 MG 24 hr tablet Take 1 tablet (120 mg total) by mouth every morning.  30 tablet  11  . labetalol (NORMODYNE) 200 MG tablet Take 200 mg by mouth 2 (two) times daily.        . metformin (FORTAMET) 500 MG (OSM) 24 hr tablet Take 1 tablet (500 mg total) by mouth daily with breakfast.  90 tablet  1  . NITROSTAT 0.4 MG SL tablet       . potassium chloride SA (K-DUR,KLOR-CON) 20 MEQ tablet Take 1 tablet (20 mEq total) by mouth 2 (two) times daily.  60 tablet  5  . triamcinolone cream (KENALOG) 0.5 % APPLY TO AFFECTED AREA TWICE DAILY  270 g  0  . warfarin (COUMADIN) 5 MG tablet Take as directed by Anticoagulation clinic   60 tablet  3  . warfarin (COUMADIN) 5 MG tablet Take as directed by Anticoagulation clinic.  Pt takes up to 2 tablets daily.  50 tablet  3    Past Medical History  Diagnosis Date  . Gout   . HTN (hypertension)   . Renal insufficiency   . Hyperlipidemia   . Osteoarthritis   .  History of CVA (cerebrovascular accident)   . GERD (gastroesophageal reflux disease)   . DJD (degenerative joint disease)   . CHF (congestive heart failure)     35 - 40% Echo 4/12  . BPH (benign prostatic hypertrophy)   . Type II or unspecified type diabetes mellitus without mention of complication, not stated as uncontrolled   . CAD (coronary artery disease)     Non obstructive    Past Surgical History  Procedure Date  . None     ROS:  As stated in the HPI and negative for all other systems.  PHYSICAL EXAM BP 165/100  Pulse 60  Ht 6\' 2"  (1.88 m)  Wt 228 lb (103.42 kg)  BMI 29.27 kg/m2 GENERAL:  Well appearing NECK:  No jugular venous distention, waveform within normal limits, carotid upstroke brisk and symmetric, no bruits, no thyromegaly LYMPHATICS:  No cervical, inguinal adenopathy LUNGS:  Clear to auscultation bilaterally BACK:  No CVA tenderness CHEST:  Unremarkable HEART:  PMI not displaced or sustained,S1 and S2 within normal  limits, no S3, no S4, no clicks, no rubs, no murmurs ABD:  Flat, positive bowel sounds normal in frequency in pitch, no bruits, no rebound, no guarding, no midline pulsatile mass, no hepatomegaly, no splenomegaly EXT:  2 plus pulses throughout, no edema, no cyanosis no clubbing SKIN:  No rashes no nodules, slight wound right anterior tibial NEURO:  Cranial nerves II through XII grossly intact, motor grossly intact throughout PSYCH:  Cognitively intact, oriented to person place and time  ASSESSMENT AND PLAN

## 2012-01-20 NOTE — Assessment & Plan Note (Signed)
I have told records from the time of his stroke in 2004. He was discharged from the hospital at that time on Plavix. I'm going to go back through records to see what the indication for his Coumadin was and make sure he still needs to be on this.

## 2012-01-20 NOTE — Assessment & Plan Note (Signed)
He has a nonischemic cardiomyopathy. He seems to be euvolemic. He will continue with the medications as listed.

## 2012-01-20 NOTE — Assessment & Plan Note (Signed)
His blood pressure is better controlled. Even though it is slightly elevated today he's keeping a watch on it at home and it's within target. He will continue the meds as listed. I have encouraged increased walking as a means of added control.

## 2012-01-20 NOTE — Patient Instructions (Signed)
The current medical regimen is effective;  continue present plan and medications.  Follow up in 1 year with Dr Hochrein.  You will receive a letter in the mail 2 months before you are due.  Please call us when you receive this letter to schedule your follow up appointment.  

## 2012-01-21 ENCOUNTER — Encounter: Payer: Self-pay | Admitting: Pharmacist

## 2012-01-27 ENCOUNTER — Ambulatory Visit (INDEPENDENT_AMBULATORY_CARE_PROVIDER_SITE_OTHER): Payer: Medicare Other | Admitting: *Deleted

## 2012-01-27 ENCOUNTER — Encounter: Payer: Self-pay | Admitting: Internal Medicine

## 2012-01-27 ENCOUNTER — Ambulatory Visit (INDEPENDENT_AMBULATORY_CARE_PROVIDER_SITE_OTHER): Payer: Medicare Other | Admitting: Internal Medicine

## 2012-01-27 VITALS — BP 140/98 | HR 96 | Temp 97.1°F | Resp 16 | Wt 228.0 lb

## 2012-01-27 DIAGNOSIS — I6350 Cerebral infarction due to unspecified occlusion or stenosis of unspecified cerebral artery: Secondary | ICD-10-CM

## 2012-01-27 DIAGNOSIS — I509 Heart failure, unspecified: Secondary | ICD-10-CM | POA: Diagnosis not present

## 2012-01-27 DIAGNOSIS — Z8679 Personal history of other diseases of the circulatory system: Secondary | ICD-10-CM

## 2012-01-27 DIAGNOSIS — Z7901 Long term (current) use of anticoagulants: Secondary | ICD-10-CM | POA: Diagnosis not present

## 2012-01-27 DIAGNOSIS — E119 Type 2 diabetes mellitus without complications: Secondary | ICD-10-CM

## 2012-01-27 DIAGNOSIS — I10 Essential (primary) hypertension: Secondary | ICD-10-CM

## 2012-01-27 DIAGNOSIS — E538 Deficiency of other specified B group vitamins: Secondary | ICD-10-CM

## 2012-01-27 DIAGNOSIS — Z01818 Encounter for other preprocedural examination: Secondary | ICD-10-CM

## 2012-01-27 DIAGNOSIS — N259 Disorder resulting from impaired renal tubular function, unspecified: Secondary | ICD-10-CM

## 2012-01-27 NOTE — Assessment & Plan Note (Signed)
Chronic, compensated Continue with current prescription therapy as reflected on the Med list.

## 2012-01-27 NOTE — Assessment & Plan Note (Signed)
Continue with current prescription therapy as reflected on the Med list.  

## 2012-01-27 NOTE — Assessment & Plan Note (Signed)
Recurrent (x4) On coumadin Risks associated with HTN treatment noncompliance were discussed. Compliance was encouraged.

## 2012-01-27 NOTE — Assessment & Plan Note (Signed)
We can reach INR<1.8 while on coumadin and have him go ahead with teeth extraction if you are comfortable with this. If you need him to be off coumadin completely - please, let us know. Thank you!

## 2012-01-27 NOTE — Assessment & Plan Note (Signed)
Risks associated with HTN treatment noncompliance were discussed. Compliance was encouraged. He has been on coumadin

## 2012-01-27 NOTE — Progress Notes (Signed)
Patient ID: Frank Mclean, male   DOB: [birth year], 71 y.o.   MRN: [id number]  Subjective:    Patient ID: Frank Mclean, male    DOB: 1941/04/24, 71 y.o.   MRN: [id number]  HPI IM consult Req by: Dr Remer Macho Reason: 4 teeth need to be extracted; he is on Coumadin due to repeat CVAs  His  chronic hypertension, chronic dyslipidemia, type 2 diabetes, controlled with medicines, stable His rash has resolved.  Past Medical History  Diagnosis Date  . Gout   . HTN (hypertension)   . Renal insufficiency   . Hyperlipidemia   . Osteoarthritis   . 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 GERD (gastroesophageal reflux disease)   . DJD (degenerative joint disease)   . CHF (congestive heart failure)     35 - 40% Echo 4/12  . BPH (benign prostatic hypertrophy)   . Type II or unspecified type diabetes mellitus without mention of complication, not stated as uncontrolled   . CAD (coronary artery disease)     Non obstructive   Past Surgical History  Procedure Date  . None     reports that he quit smoking about 37 years ago. He has never used smokeless tobacco. He reports that he does not drink alcohol or use illicit drugs. family history includes Diabetes in his mother; Gout in his other; Heart disease in his father and mother; Hypertension in his father and mother; and Stroke in his other. Allergies  Allergen Reactions  . Ace Inhibitors     REACTION: cough    Current Outpatient Prescriptions on File Prior to Visit  Medication Sig Dispense Refill  . allopurinol (ZYLOPRIM) 300 MG tablet Take 300 mg by mouth daily.        [name] amLODipine (NORVASC) 10 MG tablet       . atorvastatin (LIPITOR) 20 MG tablet TAKE 1 TABLET EVERY DAY FOR CHOLESTEROL  30 tablet  7  . Cholecalciferol 1000 UNITS tablet Take 1,000 Units by mouth daily.        . cloNIDine (CATAPRES) 0.2 MG tablet Take 0.2 mg by mouth 3 (three)  times daily.        . clotrimazole-betamethasone (LOTRISONE) cream Apply topically 2 (two) times daily as needed.        . cyanocobalamin 1000 MCG tablet Take 100 mcg by mouth daily.        [name] doxazosin (CARDURA) 1 MG tablet TAKE 1 TABLET EVERY DAY  30 tablet  2  . furosemide (LASIX) 40 MG tablet TAKE 1 TABLET EVERY DAY  30 tablet  5  . hydrALAZINE (APRESOLINE) 50 MG tablet Take one and 1/2 tablets three times a day  135 tablet  11  . HYDROcodone-acetaminophen (VICODIN) 5-500 MG per tablet Take 1 tablet by mouth 4 (four) times daily as needed.  90 tablet  1  . isosorbide mononitrate (IMDUR) 120 MG 24 hr tablet Take 1 tablet (120 mg total) by mouth every morning.  30 tablet  11  . labetalol (NORMODYNE) 200 MG tablet Take 200 mg by mouth 2 (two) times daily.        . metformin (FORTAMET) 500 MG (OSM) 24 hr tablet Take 1 tablet (500 mg total) by mouth daily with breakfast.  90 tablet  1  . NITROSTAT 0.4 MG SL tablet       . potassium chloride SA (  K-DUR,KLOR-CON) 20 MEQ tablet Take 1 tablet (20 mEq total) by mouth 2 (two) times daily.  60 tablet  5  . triamcinolone cream (KENALOG) 0.5 % APPLY TO AFFECTED AREA TWICE DAILY  270 g  0  . warfarin (COUMADIN) 5 MG tablet Take as directed by Anticoagulation clinic   60 tablet  3  . warfarin (COUMADIN) 5 MG tablet Take as directed by Anticoagulation clinic.  Pt takes up to 2 tablets daily.  50 tablet  3  . budesonide-formoterol (SYMBICORT) 160-4.5 MCG/ACT inhaler Inhale 2 puffs into the lungs 2 (two) times daily.  1 Inhaler  3  . Cholecalciferol 5000 UNITS capsule Take 5,000 Units by mouth daily.        . Insulin Syringe-Needle U-100 (B-D INS SYR MICROFINE 1CC/27G) 27G X 5/8" 1 ML MISC as directed.          BP 140/98  Pulse 96  Temp(Src) 97.1 F (36.2 C) (Oral)  Resp 16  Wt 228 lb (103.42 kg)   Review of Systems  Constitutional: Positive for fatigue. Negative for appetite change and unexpected weight change.  HENT: Negative for nosebleeds,  congestion, sore throat, sneezing, trouble swallowing and neck pain.   Eyes: Negative for itching and visual disturbance.  Respiratory: Negative for cough.   Cardiovascular: Negative for chest pain, palpitations and leg swelling.  Gastrointestinal: Negative for nausea, diarrhea, blood in stool and abdominal distention.  Genitourinary: Negative for frequency and hematuria.  Musculoskeletal: Positive for arthralgias. Negative for back pain, joint swelling and gait problem.  Skin: Negative for rash.  Neurological: Negative for dizziness, tremors, speech difficulty and weakness.  Psychiatric/Behavioral: Negative for sleep disturbance, dysphoric mood and agitation. The patient is nervous/anxious.        Objective:   Physical Exam  Constitutional: He is oriented to person, place, and time. He appears well-developed.  HENT:  Mouth/Throat: Oropharynx is clear and moist.  Eyes: Conjunctivae are normal. Pupils are equal, round, and reactive to light.  Neck: Normal range of motion. No JVD present. No thyromegaly present.  Cardiovascular: Normal rate, regular rhythm, normal heart sounds and intact distal pulses.  Exam reveals no gallop and no friction rub.   No murmur heard. Pulmonary/Chest: Effort normal and breath sounds normal. No respiratory distress. He has no wheezes. He has no rales. He exhibits no tenderness.  Abdominal: Soft. Bowel sounds are normal. He exhibits no distension and no mass. There is no tenderness. There is no rebound and no guarding.  Musculoskeletal: Normal range of motion. He exhibits no edema and no tenderness.  Lymphadenopathy:    He has no cervical adenopathy.  Neurological: He is alert and oriented to person, place, and time. He has normal reflexes. No cranial nerve deficit. He exhibits normal muscle tone. Coordination normal.  Skin: Skin is warm and dry. No rash noted.  Psychiatric: He has a normal mood and affect. His behavior is normal. Judgment and thought content  normal.    Lab Results  Component Value Date   WBC 11.0* 11/18/2011   HGB 12.9* 11/18/2011   HCT 39.3 11/18/2011   PLT 195.0 11/18/2011   GLUCOSE 132* 11/18/2011   CHOL 188 11/18/2011   TRIG 117.0 11/18/2011   HDL 61.10 11/18/2011   LDLDIRECT 177.9 06/17/2010   LDLCALC 104* 11/18/2011   ALT 12 04/15/2011   AST 13 04/15/2011   NA 145 11/18/2011   K 3.4* 11/18/2011   CL 101 11/18/2011   CREATININE 1.6* 11/18/2011   BUN 22 11/18/2011  CO2 34* 11/18/2011   TSH 2.32 11/18/2011   PSA 0.01* 11/18/2011   INR 1.1 01/20/2012   HGBA1C 6.9* 11/18/2011         Assessment & Plan:

## 2012-01-27 NOTE — Assessment & Plan Note (Signed)
Continue with current prescription therapy as reflected on the Med list. BP Readings from Last 3 Encounters:  01/27/12 140/98  01/20/12 165/100  11/18/11 142/90

## 2012-01-31 ENCOUNTER — Telehealth: Payer: Self-pay | Admitting: *Deleted

## 2012-01-31 ENCOUNTER — Encounter: Payer: Self-pay | Admitting: Internal Medicine

## 2012-01-31 NOTE — Telephone Encounter (Signed)
Please advise what do I need to fax to Dr. Remer Macho, D.D.S. Regarding coumadin and upcoming teeth extraction?  Fax # [phone]

## 2012-02-02 ENCOUNTER — Telehealth: Payer: Self-pay | Admitting: *Deleted

## 2012-02-02 NOTE — Telephone Encounter (Signed)
Yes pls and rout my note to coum clinic too pls Thx

## 2012-02-02 NOTE — Telephone Encounter (Signed)
Pt's wife informed and last OV note faxed to [employer].

## 2012-02-02 NOTE — Assessment & Plan Note (Signed)
Technically, he can have his teeth extraction done if his INR is <2.0. I would have coumadin clinic check his INR 3-4 days prior to the procedure. Please let us know if you need [place] to be completely off coumadin and if you need his INR to be in the normal range.

## 2012-02-02 NOTE — Telephone Encounter (Signed)
Called pt- spoke to pt's wife and advised her I faxed over our last OV note to Dr. Remer Macho, DDS at [phone]. Also, I advised her to tell pt to go to coumadin clinic and have INR checked 4 days prior to his dental procedure.

## 2012-02-17 ENCOUNTER — Encounter: Payer: Self-pay | Admitting: Cardiology

## 2012-02-17 ENCOUNTER — Ambulatory Visit (INDEPENDENT_AMBULATORY_CARE_PROVIDER_SITE_OTHER): Payer: Medicare Other

## 2012-02-17 DIAGNOSIS — I6350 Cerebral infarction due to unspecified occlusion or stenosis of unspecified cerebral artery: Secondary | ICD-10-CM | POA: Diagnosis not present

## 2012-02-17 DIAGNOSIS — Z7901 Long term (current) use of anticoagulants: Secondary | ICD-10-CM

## 2012-02-17 LAB — POCT INR: INR: 1.5

## 2012-02-20 ENCOUNTER — Other Ambulatory Visit: Payer: Self-pay | Admitting: Internal Medicine

## 2012-03-02 ENCOUNTER — Ambulatory Visit (INDEPENDENT_AMBULATORY_CARE_PROVIDER_SITE_OTHER): Payer: Medicare Other | Admitting: *Deleted

## 2012-03-02 DIAGNOSIS — Z7901 Long term (current) use of anticoagulants: Secondary | ICD-10-CM | POA: Diagnosis not present

## 2012-03-02 DIAGNOSIS — I6350 Cerebral infarction due to unspecified occlusion or stenosis of unspecified cerebral artery: Secondary | ICD-10-CM | POA: Diagnosis not present

## 2012-03-02 LAB — POCT INR: INR: 3.4

## 2012-03-16 ENCOUNTER — Ambulatory Visit (INDEPENDENT_AMBULATORY_CARE_PROVIDER_SITE_OTHER): Payer: Medicare Other

## 2012-03-16 DIAGNOSIS — Z7901 Long term (current) use of anticoagulants: Secondary | ICD-10-CM

## 2012-03-16 DIAGNOSIS — I6350 Cerebral infarction due to unspecified occlusion or stenosis of unspecified cerebral artery: Secondary | ICD-10-CM

## 2012-03-16 LAB — POCT INR: INR: 2

## 2012-03-20 ENCOUNTER — Ambulatory Visit (INDEPENDENT_AMBULATORY_CARE_PROVIDER_SITE_OTHER): Payer: Medicare Other | Admitting: *Deleted

## 2012-03-20 DIAGNOSIS — Z7901 Long term (current) use of anticoagulants: Secondary | ICD-10-CM | POA: Diagnosis not present

## 2012-03-20 DIAGNOSIS — I6350 Cerebral infarction due to unspecified occlusion or stenosis of unspecified cerebral artery: Secondary | ICD-10-CM

## 2012-03-21 ENCOUNTER — Telehealth: Payer: Self-pay | Admitting: *Deleted

## 2012-03-21 NOTE — Telephone Encounter (Signed)
They received release of tx form but medication porion was blank. They want to know if pt can hold his coumadin 5 days prior to his procedure and 2 days following. OR if he has to hold it at all. Please advise.

## 2012-03-21 NOTE — Telephone Encounter (Signed)
He does not have to stop Coumadin - just make sure his INR is<2.0 pre-op  "Preop exam for internal medicine - [name], MD 02/02/2012 7:51 AM Signed  Technically, he can have his teeth extraction done if his INR is <2.0. I would have coumadin clinic check his INR 3-4 days prior to the procedure. Please let us know if you need [place] to be completely off coumadin and if you need his INR to be in the normal range."   Thx

## 2012-03-24 NOTE — Telephone Encounter (Signed)
Called above number- pt has already had procedure.

## 2012-03-29 DIAGNOSIS — H2510 Age-related nuclear cataract, unspecified eye: Secondary | ICD-10-CM | POA: Diagnosis not present

## 2012-03-30 ENCOUNTER — Ambulatory Visit (INDEPENDENT_AMBULATORY_CARE_PROVIDER_SITE_OTHER): Payer: Medicare Other | Admitting: *Deleted

## 2012-03-30 DIAGNOSIS — Z7901 Long term (current) use of anticoagulants: Secondary | ICD-10-CM

## 2012-03-30 DIAGNOSIS — I6350 Cerebral infarction due to unspecified occlusion or stenosis of unspecified cerebral artery: Secondary | ICD-10-CM

## 2012-04-06 ENCOUNTER — Ambulatory Visit (INDEPENDENT_AMBULATORY_CARE_PROVIDER_SITE_OTHER): Payer: Medicare Other

## 2012-04-06 DIAGNOSIS — Z7901 Long term (current) use of anticoagulants: Secondary | ICD-10-CM

## 2012-04-06 DIAGNOSIS — I6350 Cerebral infarction due to unspecified occlusion or stenosis of unspecified cerebral artery: Secondary | ICD-10-CM | POA: Diagnosis not present

## 2012-04-17 ENCOUNTER — Ambulatory Visit (INDEPENDENT_AMBULATORY_CARE_PROVIDER_SITE_OTHER): Payer: Medicare Other

## 2012-04-17 DIAGNOSIS — I6350 Cerebral infarction due to unspecified occlusion or stenosis of unspecified cerebral artery: Secondary | ICD-10-CM | POA: Diagnosis not present

## 2012-04-17 DIAGNOSIS — Z7901 Long term (current) use of anticoagulants: Secondary | ICD-10-CM

## 2012-04-19 ENCOUNTER — Other Ambulatory Visit: Payer: Self-pay | Admitting: Cardiology

## 2012-05-04 ENCOUNTER — Ambulatory Visit (INDEPENDENT_AMBULATORY_CARE_PROVIDER_SITE_OTHER): Payer: Medicare Other

## 2012-05-04 ENCOUNTER — Other Ambulatory Visit: Payer: Self-pay | Admitting: Cardiology

## 2012-05-04 DIAGNOSIS — Z7901 Long term (current) use of anticoagulants: Secondary | ICD-10-CM | POA: Diagnosis not present

## 2012-05-04 DIAGNOSIS — I6350 Cerebral infarction due to unspecified occlusion or stenosis of unspecified cerebral artery: Secondary | ICD-10-CM | POA: Diagnosis not present

## 2012-05-04 MED ORDER — AMLODIPINE BESYLATE 10 MG PO TABS: 10.0000 mg | ORAL_TABLET | Freq: Every day | ORAL | Status: DC

## 2012-05-17 ENCOUNTER — Other Ambulatory Visit: Payer: Self-pay | Admitting: Cardiology

## 2012-05-17 MED ORDER — CLONIDINE HCL 0.2 MG PO TABS: 0.2000 mg | ORAL_TABLET | Freq: Three times a day (TID) | ORAL | Status: DC

## 2012-05-18 ENCOUNTER — Ambulatory Visit (INDEPENDENT_AMBULATORY_CARE_PROVIDER_SITE_OTHER): Payer: Medicare Other | Admitting: Pharmacist

## 2012-05-18 DIAGNOSIS — Z7901 Long term (current) use of anticoagulants: Secondary | ICD-10-CM

## 2012-05-18 DIAGNOSIS — I6350 Cerebral infarction due to unspecified occlusion or stenosis of unspecified cerebral artery: Secondary | ICD-10-CM

## 2012-05-18 LAB — POCT INR: INR: 2.1

## 2012-05-31 ENCOUNTER — Other Ambulatory Visit: Payer: Self-pay | Admitting: Cardiology

## 2012-05-31 ENCOUNTER — Other Ambulatory Visit: Payer: Self-pay | Admitting: Internal Medicine

## 2012-06-01 ENCOUNTER — Telehealth: Payer: Self-pay | Admitting: *Deleted

## 2012-06-01 MED ORDER — HYDROCODONE-ACETAMINOPHEN 5-500 MG PO TABS: 1.0000 | ORAL_TABLET | Freq: Four times a day (QID) | ORAL | Status: DC | PRN

## 2012-06-01 NOTE — Telephone Encounter (Signed)
OK to fill this prescription with additional refills x0 Thank you!  

## 2012-06-01 NOTE — Telephone Encounter (Signed)
Rf req for Hydroco/APAP 5/500 1 po qid prn. Ok to RF?

## 2012-06-01 NOTE — Telephone Encounter (Signed)
Done

## 2012-06-08 ENCOUNTER — Ambulatory Visit (INDEPENDENT_AMBULATORY_CARE_PROVIDER_SITE_OTHER): Payer: Medicare Other | Admitting: *Deleted

## 2012-06-08 DIAGNOSIS — Z7901 Long term (current) use of anticoagulants: Secondary | ICD-10-CM | POA: Diagnosis not present

## 2012-06-08 DIAGNOSIS — I6350 Cerebral infarction due to unspecified occlusion or stenosis of unspecified cerebral artery: Secondary | ICD-10-CM

## 2012-06-08 LAB — POCT INR: INR: 2.5

## 2012-07-06 ENCOUNTER — Ambulatory Visit (INDEPENDENT_AMBULATORY_CARE_PROVIDER_SITE_OTHER): Payer: Medicare Other | Admitting: *Deleted

## 2012-07-06 DIAGNOSIS — I6350 Cerebral infarction due to unspecified occlusion or stenosis of unspecified cerebral artery: Secondary | ICD-10-CM | POA: Diagnosis not present

## 2012-07-06 DIAGNOSIS — Z7901 Long term (current) use of anticoagulants: Secondary | ICD-10-CM | POA: Diagnosis not present

## 2012-07-26 ENCOUNTER — Other Ambulatory Visit: Payer: Self-pay | Admitting: Internal Medicine

## 2012-07-27 ENCOUNTER — Ambulatory Visit (INDEPENDENT_AMBULATORY_CARE_PROVIDER_SITE_OTHER): Payer: Medicare Other | Admitting: *Deleted

## 2012-07-27 DIAGNOSIS — I6350 Cerebral infarction due to unspecified occlusion or stenosis of unspecified cerebral artery: Secondary | ICD-10-CM | POA: Diagnosis not present

## 2012-07-27 DIAGNOSIS — Z7901 Long term (current) use of anticoagulants: Secondary | ICD-10-CM | POA: Diagnosis not present

## 2012-08-17 ENCOUNTER — Ambulatory Visit (INDEPENDENT_AMBULATORY_CARE_PROVIDER_SITE_OTHER): Payer: Medicare Other | Admitting: Pharmacist

## 2012-08-17 DIAGNOSIS — I6350 Cerebral infarction due to unspecified occlusion or stenosis of unspecified cerebral artery: Secondary | ICD-10-CM | POA: Diagnosis not present

## 2012-08-17 DIAGNOSIS — Z7901 Long term (current) use of anticoagulants: Secondary | ICD-10-CM

## 2012-09-08 ENCOUNTER — Telehealth: Payer: Self-pay

## 2012-09-08 NOTE — Telephone Encounter (Signed)
OK to fill this prescription with additional refills x1. Sch OV Thank you!  

## 2012-09-08 NOTE — Telephone Encounter (Signed)
Please advise if ok to refill hydrocodone 5-500 qid prn. Last filled 06/01/12 #90, pt last seen 02/06/12

## 2012-09-11 NOTE — Telephone Encounter (Signed)
RX called into pharmacy

## 2012-09-12 ENCOUNTER — Other Ambulatory Visit: Payer: Self-pay | Admitting: General Practice

## 2012-09-12 ENCOUNTER — Other Ambulatory Visit: Payer: Self-pay | Admitting: Internal Medicine

## 2012-09-12 MED ORDER — WARFARIN SODIUM 5 MG PO TABS: 5.0000 mg | ORAL_TABLET | Freq: Every day | ORAL | Status: DC

## 2012-09-19 NOTE — Telephone Encounter (Signed)
This encounter was created in error - please disregard.

## 2012-09-27 ENCOUNTER — Other Ambulatory Visit: Payer: Self-pay | Admitting: Internal Medicine

## 2012-10-03 ENCOUNTER — Other Ambulatory Visit: Payer: Self-pay | Admitting: Internal Medicine

## 2012-11-21 ENCOUNTER — Ambulatory Visit (INDEPENDENT_AMBULATORY_CARE_PROVIDER_SITE_OTHER): Payer: Medicare Other | Admitting: *Deleted

## 2012-11-21 DIAGNOSIS — I6350 Cerebral infarction due to unspecified occlusion or stenosis of unspecified cerebral artery: Secondary | ICD-10-CM

## 2012-11-21 DIAGNOSIS — Z7901 Long term (current) use of anticoagulants: Secondary | ICD-10-CM

## 2012-11-21 MED ORDER — WARFARIN SODIUM 5 MG PO TABS: 5.0000 mg | ORAL_TABLET | ORAL | Status: DC

## 2012-12-26 ENCOUNTER — Ambulatory Visit (INDEPENDENT_AMBULATORY_CARE_PROVIDER_SITE_OTHER): Payer: Medicare Other

## 2012-12-26 DIAGNOSIS — I6350 Cerebral infarction due to unspecified occlusion or stenosis of unspecified cerebral artery: Secondary | ICD-10-CM

## 2012-12-26 DIAGNOSIS — Z7901 Long term (current) use of anticoagulants: Secondary | ICD-10-CM

## 2012-12-27 ENCOUNTER — Ambulatory Visit (INDEPENDENT_AMBULATORY_CARE_PROVIDER_SITE_OTHER): Payer: Medicare Other | Admitting: Internal Medicine

## 2012-12-27 ENCOUNTER — Encounter: Payer: Self-pay | Admitting: Internal Medicine

## 2012-12-27 VITALS — BP 170/108 | HR 83 | Temp 97.8°F | Wt 225.0 lb

## 2012-12-27 DIAGNOSIS — J449 Chronic obstructive pulmonary disease, unspecified: Secondary | ICD-10-CM | POA: Insufficient documentation

## 2012-12-27 DIAGNOSIS — IMO0001 Reserved for inherently not codable concepts without codable children: Secondary | ICD-10-CM

## 2012-12-27 DIAGNOSIS — E119 Type 2 diabetes mellitus without complications: Secondary | ICD-10-CM | POA: Diagnosis not present

## 2012-12-27 DIAGNOSIS — I509 Heart failure, unspecified: Secondary | ICD-10-CM

## 2012-12-27 DIAGNOSIS — J40 Bronchitis, not specified as acute or chronic: Secondary | ICD-10-CM

## 2012-12-27 MED ORDER — FLUTICASONE FUROATE-VILANTEROL 100-25 MCG/INH IN AEPB: 1.0000 | INHALATION_SPRAY | Freq: Every day | RESPIRATORY_TRACT | Status: DC

## 2012-12-27 MED ORDER — AMOXICILLIN 500 MG PO CAPS: 1000.0000 mg | ORAL_CAPSULE | Freq: Two times a day (BID) | ORAL | Status: DC

## 2012-12-27 MED ORDER — PROMETHAZINE-CODEINE 6.25-10 MG/5ML PO SYRP: 5.0000 mL | ORAL_SOLUTION | ORAL | Status: DC | PRN

## 2012-12-27 NOTE — Assessment & Plan Note (Signed)
Continue with current prescription therapy as reflected on the Med list.  

## 2012-12-27 NOTE — Assessment & Plan Note (Signed)
Stable Continue with current prescription therapy as reflected on the Med list.  

## 2012-12-27 NOTE — Assessment & Plan Note (Addendum)
Breo qd for wheezing

## 2012-12-27 NOTE — Patient Instructions (Addendum)
Use over-the-counter  "cold" medicines  such as  "Afrin" nasal spray for nasal congestion as directed instead. Use" Delsym" or" Robitussin" cough syrup varietis for cough.  You can use plain "Tylenol" or "Advil" for fever, chills and achyness.  Please, make an appointment if you are not better or if you're worse.  

## 2012-12-27 NOTE — Progress Notes (Signed)
Patient ID: Frank Mclean, male   DOB: [birth year], 72 y.o.   MRN: [id number]  Subjective:    Patient ID: Frank Mclean, male    DOB: 04-20-1941, 72 y.o.   MRN: [id number]  Cough This is a new problem. The current episode started 1 to 4 weeks ago. The problem has been rapidly worsening. The cough is productive of brown sputum. Pertinent negatives include no chest pain, rash or sore throat. His past medical history is significant for COPD.    The patient presents for a follow-up of  chronic hypertension, chronic dyslipidemia, type 2 diabetes, rash - resolved, controlled with medicines    Review of Systems  Constitutional: Positive for fatigue. Negative for appetite change and unexpected weight change.  HENT: Negative for nosebleeds, congestion, sore throat, sneezing, trouble swallowing and neck pain.   Eyes: Negative for itching and visual disturbance.  Respiratory: Positive for cough.   Cardiovascular: Negative for chest pain, palpitations and leg swelling.  Gastrointestinal: Negative for nausea, diarrhea, blood in stool and abdominal distention.  Genitourinary: Negative for frequency and hematuria.  Musculoskeletal: Positive for arthralgias. Negative for back pain, joint swelling and gait problem.  Skin: Negative for rash.  Neurological: Negative for dizziness, tremors, speech difficulty and weakness.  Psychiatric/Behavioral: Negative for sleep disturbance, dysphoric mood and agitation. The patient is nervous/anxious.        Objective:   Physical Exam  Constitutional: He is oriented to person, place, and time. He appears well-developed.  HENT:  Mouth/Throat: Oropharynx is clear and moist.  Eyes: Conjunctivae normal are normal. Pupils are equal, round, and reactive to light.  Neck: Normal range of motion. No JVD present. No thyromegaly present.  Cardiovascular: Normal rate, regular rhythm, normal heart sounds and intact distal pulses.  Exam reveals no gallop and no friction rub.   No  murmur heard. Pulmonary/Chest: Effort normal and breath sounds normal. No respiratory distress. He has no wheezes. He has no rales. He exhibits no tenderness.  Abdominal: Soft. Bowel sounds are normal. He exhibits no distension and no mass. There is no tenderness. There is no rebound and no guarding.  Musculoskeletal: Normal range of motion. He exhibits no edema and no tenderness.  Lymphadenopathy:    He has no cervical adenopathy.  Neurological: He is alert and oriented to person, place, and time. He has normal reflexes. No cranial nerve deficit. He exhibits normal muscle tone. Coordination normal.  Skin: Skin is warm and dry. No rash noted.  Psychiatric: He has a normal mood and affect. His behavior is normal. Judgment and thought content normal.    Lab Results  Component Value Date   WBC 11.0* 11/18/2011   HGB 12.9* 11/18/2011   HCT 39.3 11/18/2011   PLT 195.0 11/18/2011   GLUCOSE 132* 11/18/2011   CHOL 188 11/18/2011   TRIG 117.0 11/18/2011   HDL 61.10 11/18/2011   LDLDIRECT 177.9 06/17/2010   LDLCALC 104* 11/18/2011   ALT 12 04/15/2011   AST 13 04/15/2011   NA 145 11/18/2011   K 3.4* 11/18/2011   CL 101 11/18/2011   CREATININE 1.6* 11/18/2011   BUN 22 11/18/2011   CO2 34* 11/18/2011   TSH 2.32 11/18/2011   PSA 0.01* 11/18/2011   INR 1.7 12/26/2012   HGBA1C 6.9* 11/18/2011   I personally provided the Breo inhaler use teaching. After the teaching patient was able to demonstrate it's use effectively. All questions were answered       Assessment & Plan:

## 2012-12-29 ENCOUNTER — Ambulatory Visit (INDEPENDENT_AMBULATORY_CARE_PROVIDER_SITE_OTHER)
Admission: RE | Admit: 2012-12-29 | Discharge: 2012-12-29 | Disposition: A | Discharge status: Home / Self Care | Payer: Medicare Other | Source: Ambulatory Visit | Attending: Internal Medicine | Admitting: Internal Medicine

## 2012-12-29 ENCOUNTER — Ambulatory Visit (INDEPENDENT_AMBULATORY_CARE_PROVIDER_SITE_OTHER): Payer: Medicare Other | Admitting: Internal Medicine

## 2012-12-29 ENCOUNTER — Telehealth: Payer: Self-pay | Admitting: Internal Medicine

## 2012-12-29 ENCOUNTER — Encounter: Payer: Self-pay | Admitting: Internal Medicine

## 2012-12-29 ENCOUNTER — Telehealth: Payer: Self-pay | Admitting: *Deleted

## 2012-12-29 VITALS — BP 148/98 | HR 68 | Temp 97.5°F | Ht 74.0 in | Wt 228.0 lb

## 2012-12-29 DIAGNOSIS — J209 Acute bronchitis, unspecified: Secondary | ICD-10-CM | POA: Diagnosis not present

## 2012-12-29 DIAGNOSIS — J449 Chronic obstructive pulmonary disease, unspecified: Secondary | ICD-10-CM | POA: Diagnosis not present

## 2012-12-29 NOTE — Telephone Encounter (Signed)
Pt's spouse calling requesting results of xray done today-please advise in NP's absence

## 2012-12-29 NOTE — Progress Notes (Signed)
Subjective:    Patient ID: Frank Mclean, male    DOB: 1941-06-02, 72 y.o.   MRN: [id number]  HPI  Pt presents to the clinic today with c/o acute bronchitis. He was seen by Dr. Posey Rea on 12/27/12 and diagnosed with bronchitis. He was given Amoxicillin at that time. He has only been taking it for 2 days, but states the shortness of breath is worse. He is having to sleep on 2 pillows at night. He does take lasix daily for CHF.  Review of Systems      Past Medical History  Diagnosis Date  . Gout   . HTN (hypertension)   . Renal insufficiency   . Hyperlipidemia   . Osteoarthritis   . 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 GERD (gastroesophageal reflux disease)   . DJD (degenerative joint disease)   . CHF (congestive heart failure)     35 - 40% Echo 4/12  . BPH (benign prostatic hypertrophy)   . Type II or unspecified type diabetes mellitus without mention of complication, not stated as uncontrolled   . CAD (coronary artery disease)     Non obstructive    Current Outpatient Prescriptions  Medication Sig Dispense Refill  . allopurinol (ZYLOPRIM) 300 MG tablet Take 300 mg by mouth daily.        [name] amLODipine (NORVASC) 10 MG tablet Take 1 tablet (10 mg total) by mouth daily.  30 tablet  11  . amoxicillin (AMOXIL) 500 MG capsule Take 2 capsules (1,000 mg total) by mouth 2 (two) times daily.  40 capsule  0  . atorvastatin (LIPITOR) 20 MG tablet TAKE 1 TABLET EVERY DAY FOR CHOLESTEROL  30 tablet  6  . budesonide-formoterol (SYMBICORT) 160-4.5 MCG/ACT inhaler Inhale 2 puffs into the lungs 2 (two) times daily.      . Cholecalciferol 1000 UNITS tablet Take 1,000 Units by mouth daily.        . Cholecalciferol 5000 UNITS capsule Take 5,000 Units by mouth daily.        . cloNIDine (CATAPRES) 0.2 MG tablet Take 1 tablet (0.2 mg total) by mouth 3 (three) times daily.  90 tablet  8  .  clotrimazole-betamethasone (LOTRISONE) cream Apply topically 2 (two) times daily as needed.        . cyanocobalamin 1000 MCG tablet Take 100 mcg by mouth daily.        [name] doxazosin (CARDURA) 1 MG tablet TAKE 1 TABLET EVERY DAY  30 tablet  2  . Fluticasone Furoate-Vilanterol (BREO ELLIPTA) 100-25 MCG/INH AEPB Inhale 1 Act into the lungs daily.  1 each  5  . furosemide (LASIX) 40 MG tablet TAKE 1 TABLET EVERY DAY  30 tablet  5  . hydrALAZINE (APRESOLINE) 50 MG tablet TAKE 1 AND 1/2 TABLETS THREE TIMES A DAY  135 tablet  8  . HYDROcodone-acetaminophen (VICODIN) 5-500 MG per tablet Take 1 tablet by mouth 4 (four) times daily as needed.  90 tablet  0  . Insulin Syringe-Needle U-100 (B-D INS SYR MICROFINE 1CC/27G) 27G X 5/8" 1 ML MISC as directed.        . isosorbide mononitrate (IMDUR) 120 MG 24 hr tablet TAKE 1 TABLET (120 MG TOTAL) BY MOUTH EVERY MORNING.  30 tablet  8  . KLOR-CON M20 20 MEQ tablet TAKE 1 TABLET BY MOUTH TWICE DAILY.  60 tablet  5  .  labetalol (NORMODYNE) 200 MG tablet Take 200 mg by mouth 2 (two) times daily.        . metFORMIN (GLUCOPHAGE-XR) 500 MG 24 hr tablet TAKE 1 TABLET (500 MG TOTAL) BY MOUTH DAILY WITH BREAKFAST.  90 tablet  1  . NITROSTAT 0.4 MG SL tablet       . promethazine-codeine (PHENERGAN WITH CODEINE) 6.25-10 MG/5ML syrup Take 5 mLs by mouth every 4 (four) hours as needed for cough.  240 mL  0  . triamcinolone cream (KENALOG) 0.5 % APPLY TO AFFECTED AREA TWICE DAILY  270 g  0  . warfarin (COUMADIN) 5 MG tablet Take as directed by Anticoagulation clinic   60 tablet  3  . warfarin (COUMADIN) 5 MG tablet Take 1 tablet (5 mg total) by mouth as directed.  50 tablet  3    Allergies  Allergen Reactions  . Ace Inhibitors     REACTION: cough    Family History  Problem Relation Age of Onset  . Hypertension Mother   . Heart disease Mother   . Diabetes Mother   . Gout Other   . Stroke Other   . Hypertension Father   . Heart disease Father     History   Social  History  . Marital Status: Married    Spouse Name: N/A    Number of Children: N/A  . Years of Education: N/A   Occupational History  . Retired - Working in Product manager    Social History Main Topics  . Smoking status: Former Smoker    Quit date: 04/12/1974  . Smokeless tobacco: Never Used  . Alcohol Use: No  . Drug Use: No  . Sexually Active: Not on file   Other Topics Concern  . Not on file   Social History Narrative  . No narrative on file     Constitutional: Denies fever, malaise, fatigue, headache or abrupt weight changes.  HEENT: Denies eye pain, eye redness, ear pain, ringing in the ears, wax buildup, runny nose, nasal congestion, bloody nose, or sore throat. Respiratory: Pt reports shortness of breath. Denies difficulty breathing, cough or sputum production.   Cardiovascular: Denies chest pain, chest tightness, palpitations or swelling in the hands or feet.  Gastrointestinal: Denies abdominal pain, bloating, constipation, diarrhea or blood in the stool.    No other specific complaints in a complete review of systems (except as listed in HPI above).  Objective:   Physical Exam   BP 148/98  Pulse 68  Temp 97.5 F (36.4 C) (Oral)  Ht 6\' 2"  (1.88 m)  Wt 228 lb (103.42 kg)  BMI 29.27 kg/m2  SpO2 99% Wt Readings from Last 3 Encounters:  12/29/12 228 lb (103.42 kg)  12/27/12 225 lb (102.059 kg)  01/27/12 228 lb (103.42 kg)    General: Appears her stated age, well developed, well nourished in NAD. Cardiovascular: Normal rate and rhythm. S1,S2 noted.  No murmur, rubs or gallops noted. No JVD or BLE edema. No carotid bruits noted. Pulmonary/Chest: Normal effort and positive vesicular breath sounds. No respiratory distress. No wheezes, rales or ronchi noted.        Assessment & Plan:   COPD exacerbation with acute bronchitis:  Continue the Amoxicillin  Will check xray to r/o other lung etiology  RTC as needed or if symptoms persist

## 2012-12-29 NOTE — Telephone Encounter (Signed)
[name], please, inform patient that there is no pneumonia on CXR Thx

## 2012-12-29 NOTE — Telephone Encounter (Signed)
Patient Information:  Caller Name: Lilla Shook  Phone: [phone]  Patient: Frank Mclean, Frank Mclean  Gender: Male  DOB: [birth year]  Age: 72 Years  PCP: Plotnikov, Alex (Adults only)  Office Follow Up:  Does the office need to follow up with this patient?: Yes  Instructions For The Office: Office scheduled patient to come at 13:15 with Rolena Infante. NP.  Patient worse since office visit per wife. Breathing faster and short of breath.  Spoke with patient feels better after treatment.  If earlier appt available please contact. Otherwise will be seen at 1:15.   Symptoms  Reason For Call & Symptoms: Patient was seen by Dr. Posey Rea on Tuesday and diagnosed with Bronchitis placed on antibiotic Amoxil, Breo and Phenergan with codiene.  Wife states he is worse. He is short of breath and breathing  fast, has to sit up to breath.  +cough productive brown.  Just repeated breathing treatment with improvement.  Reviewed Health History In EMR: Yes  Reviewed Medications In EMR: Yes  Reviewed Allergies In EMR: Yes  Reviewed Surgeries / Procedures: No  Date of Onset of Symptoms: 12/20/2012  Treatments Tried: Rx medications .  Treatments Tried Worked: No  Guideline(s) Used:  Breathing Difficulty  Disposition Per Guideline:   Go to Office Now  Reason For Disposition Reached:   Mild difficulty breathing (e.g., minimal/no SOB at rest, SOB with walking, pulse < 100) of new onset or worse than normal  Advice Given:  General Care Advice for Breathing Difficulty:  Find position of greatest comfort. For most patients the best position is semi-upright (e.g., sitting up in a comfortable chair or lying back against pillows).  Elevate head of bed (e.g., use pillows or place blocks under bed).  Avoid smoke or fume exposure.  Use a humidifier.  General Care Advice for Breathing Difficulty:  Find position of greatest comfort. For most patients the best position is semi-upright (e.g., sitting up in a comfortable chair or lying  back against pillows).  Elevate head of bed (e.g., use pillows or place blocks under bed).  Avoid smoke or fume exposure.  Keep room temperature slightly on the cool side.  Use a humidifier.  Call Back If:  Severe difficulty breathing occurs  Fever more than 100.5 F (38.1 C)  You become worse.  Appointment Scheduled:  12/29/2012 13:15:00 Appointment Scheduled Provider:  Nicki Reaper

## 2012-12-29 NOTE — Patient Instructions (Addendum)

## 2012-12-30 ENCOUNTER — Emergency Department (HOSPITAL_COMMUNITY): Payer: Medicare Other

## 2012-12-30 ENCOUNTER — Observation Stay (HOSPITAL_COMMUNITY)
Admission: EM | Admit: 2012-12-30 | Discharge: 2012-12-31 | Disposition: A | Discharge status: Home / Self Care | Payer: Medicare Other | Attending: Internal Medicine | Admitting: Internal Medicine

## 2012-12-30 ENCOUNTER — Encounter (HOSPITAL_COMMUNITY): Payer: Self-pay | Admitting: *Deleted

## 2012-12-30 DIAGNOSIS — I6350 Cerebral infarction due to unspecified occlusion or stenosis of unspecified cerebral artery: Secondary | ICD-10-CM | POA: Diagnosis present

## 2012-12-30 DIAGNOSIS — I428 Other cardiomyopathies: Secondary | ICD-10-CM | POA: Insufficient documentation

## 2012-12-30 DIAGNOSIS — IMO0001 Reserved for inherently not codable concepts without codable children: Secondary | ICD-10-CM

## 2012-12-30 DIAGNOSIS — I509 Heart failure, unspecified: Secondary | ICD-10-CM | POA: Diagnosis not present

## 2012-12-30 DIAGNOSIS — E1129 Type 2 diabetes mellitus with other diabetic kidney complication: Secondary | ICD-10-CM | POA: Diagnosis not present

## 2012-12-30 DIAGNOSIS — I5043 Acute on chronic combined systolic (congestive) and diastolic (congestive) heart failure: Secondary | ICD-10-CM | POA: Diagnosis not present

## 2012-12-30 DIAGNOSIS — Z7901 Long term (current) use of anticoagulants: Secondary | ICD-10-CM | POA: Diagnosis not present

## 2012-12-30 DIAGNOSIS — J449 Chronic obstructive pulmonary disease, unspecified: Secondary | ICD-10-CM | POA: Insufficient documentation

## 2012-12-30 DIAGNOSIS — N183 Chronic kidney disease, stage 3 unspecified: Secondary | ICD-10-CM

## 2012-12-30 DIAGNOSIS — Z8673 Personal history of transient ischemic attack (TIA), and cerebral infarction without residual deficits: Secondary | ICD-10-CM | POA: Diagnosis not present

## 2012-12-30 DIAGNOSIS — M109 Gout, unspecified: Secondary | ICD-10-CM | POA: Insufficient documentation

## 2012-12-30 DIAGNOSIS — R0602 Shortness of breath: Secondary | ICD-10-CM | POA: Insufficient documentation

## 2012-12-30 DIAGNOSIS — K219 Gastro-esophageal reflux disease without esophagitis: Secondary | ICD-10-CM | POA: Diagnosis not present

## 2012-12-30 DIAGNOSIS — N058 Unspecified nephritic syndrome with other morphologic changes: Secondary | ICD-10-CM

## 2012-12-30 DIAGNOSIS — E785 Hyperlipidemia, unspecified: Secondary | ICD-10-CM | POA: Diagnosis present

## 2012-12-30 DIAGNOSIS — J4489 Other specified chronic obstructive pulmonary disease: Secondary | ICD-10-CM | POA: Insufficient documentation

## 2012-12-30 DIAGNOSIS — I129 Hypertensive chronic kidney disease with stage 1 through stage 4 chronic kidney disease, or unspecified chronic kidney disease: Secondary | ICD-10-CM | POA: Insufficient documentation

## 2012-12-30 DIAGNOSIS — I2510 Atherosclerotic heart disease of native coronary artery without angina pectoris: Secondary | ICD-10-CM | POA: Diagnosis not present

## 2012-12-30 DIAGNOSIS — I10 Essential (primary) hypertension: Secondary | ICD-10-CM

## 2012-12-30 DIAGNOSIS — R079 Chest pain, unspecified: Secondary | ICD-10-CM | POA: Diagnosis not present

## 2012-12-30 DIAGNOSIS — J40 Bronchitis, not specified as acute or chronic: Secondary | ICD-10-CM

## 2012-12-30 LAB — CBC
HCT: 38.6 % — ABNORMAL LOW (ref 39.0–52.0)
Hemoglobin: 12.4 g/dL — ABNORMAL LOW (ref 13.0–17.0)
MCH: 25.5 pg — ABNORMAL LOW (ref 26.0–34.0)
MCHC: 32.1 g/dL (ref 30.0–36.0)
MCV: 79.4 fL (ref 78.0–100.0)
Platelets: 308 K/uL (ref 150–400)
RBC: 4.86 MIL/uL (ref 4.22–5.81)
RDW: 15 % (ref 11.5–15.5)
WBC: 10.5 K/uL (ref 4.0–10.5)

## 2012-12-30 LAB — BASIC METABOLIC PANEL
CO2: 24 mEq/L (ref 19–32)
Chloride: 106 mEq/L (ref 96–112)
GFR calc non Af Amer: 44 mL/min — ABNORMAL LOW (ref >90)
Glucose, Bld: 157 mg/dL — ABNORMAL HIGH (ref 70–99)
Potassium: 3.3 mEq/L — ABNORMAL LOW (ref 3.5–5.1)
Sodium: 144 mEq/L (ref 135–145)

## 2012-12-30 LAB — CBC WITH DIFFERENTIAL/PLATELET
Eosinophils Absolute: 0.2 10*3/uL (ref 0.0–0.7)
HCT: 36.2 % — ABNORMAL LOW (ref 39.0–52.0)
Hemoglobin: 11.6 g/dL — ABNORMAL LOW (ref 13.0–17.0)
Lymphs Abs: 1.3 10*3/uL (ref 0.7–4.0)
MCH: 25.3 pg — ABNORMAL LOW (ref 26.0–34.0)
Monocytes Absolute: 0.5 10*3/uL (ref 0.1–1.0)
Monocytes Relative: 5 % (ref 3–12)
Neutrophils Relative %: 79 % — ABNORMAL HIGH (ref 43–77)
RBC: 4.58 MIL/uL (ref 4.22–5.81)

## 2012-12-30 LAB — GLUCOSE, CAPILLARY
Glucose-Capillary: 128 mg/dL — ABNORMAL HIGH (ref 70–99)
Glucose-Capillary: 146 mg/dL — ABNORMAL HIGH (ref 70–99)
Glucose-Capillary: 109 mg/dL — ABNORMAL HIGH (ref 70–99)

## 2012-12-30 LAB — PROTIME-INR
INR: 2.33 — ABNORMAL HIGH (ref 0.00–1.49)
Prothrombin Time: 24.5 seconds — ABNORMAL HIGH (ref 11.6–15.2)

## 2012-12-30 LAB — POCT I-STAT, CHEM 8
HCT: 36 % — ABNORMAL LOW (ref 39.0–52.0)
Hemoglobin: 12.2 g/dL — ABNORMAL LOW (ref 13.0–17.0)
Potassium: 3.3 mEq/L — ABNORMAL LOW (ref 3.5–5.1)
Sodium: 146 mEq/L — ABNORMAL HIGH (ref 135–145)

## 2012-12-30 LAB — POCT I-STAT TROPONIN I

## 2012-12-30 LAB — TROPONIN I: Troponin I: <0.3 ng/mL (ref <0.30)

## 2012-12-30 LAB — CREATININE, SERUM: GFR calc Af Amer: 56 mL/min — ABNORMAL LOW (ref >90)

## 2012-12-30 MED ORDER — ISOSORBIDE MONONITRATE ER 60 MG PO TB24: 120.0000 mg | ORAL_TABLET | Freq: Every day | ORAL | Status: DC

## 2012-12-30 MED ORDER — NITROGLYCERIN 5 MG/ML IV SOLN: 5.0000 ug/kg/min | INTRAVENOUS | Status: DC

## 2012-12-30 MED ORDER — FUROSEMIDE 10 MG/ML IJ SOLN
40.0000 mg | INTRAMUSCULAR | Status: AC
  Administered 2012-12-30: 40 mg via INTRAVENOUS
  Filled 2012-12-30: qty 4

## 2012-12-30 MED ORDER — CLONIDINE HCL 0.1 MG PO TABS
0.1000 mg | ORAL_TABLET | Freq: Once | ORAL | Status: AC
  Administered 2012-12-30: 0.1 mg via ORAL
  Filled 2012-12-30: qty 1

## 2012-12-30 MED ORDER — ONDANSETRON HCL 4 MG/2ML IJ SOLN: 4.0000 mg | Freq: Three times a day (TID) | INTRAMUSCULAR | Status: DC | PRN

## 2012-12-30 MED ORDER — SODIUM CHLORIDE 0.9 % IV SOLN: 250.0000 mL | INTRAVENOUS | Status: DC | PRN

## 2012-12-30 MED ORDER — WARFARIN - PHYSICIAN DOSING INPATIENT
Freq: Every day | Status: DC
  Administered 2012-12-30: 18:00:00

## 2012-12-30 MED ORDER — AMLODIPINE BESYLATE 10 MG PO TABS
10.0000 mg | ORAL_TABLET | Freq: Every day | ORAL | Status: DC
  Administered 2012-12-30 – 2012-12-31 (×2): 10 mg via ORAL
  Filled 2012-12-30 (×2): qty 1

## 2012-12-30 MED ORDER — DOXAZOSIN MESYLATE 1 MG PO TABS
1.0000 mg | ORAL_TABLET | Freq: Every day | ORAL | Status: DC
  Administered 2012-12-30 – 2012-12-31 (×2): 1 mg via ORAL
  Filled 2012-12-30 (×2): qty 1

## 2012-12-30 MED ORDER — POTASSIUM CHLORIDE CRYS ER 20 MEQ PO TBCR
20.0000 meq | EXTENDED_RELEASE_TABLET | Freq: Every day | ORAL | Status: DC
  Administered 2012-12-30 – 2012-12-31 (×2): 20 meq via ORAL
  Filled 2012-12-30 (×2): qty 1

## 2012-12-30 MED ORDER — LEVALBUTEROL HCL 0.63 MG/3ML IN NEBU
0.6300 mg | INHALATION_SOLUTION | Freq: Three times a day (TID) | RESPIRATORY_TRACT | Status: DC | PRN
  Filled 2012-12-30: qty 3

## 2012-12-30 MED ORDER — ATORVASTATIN CALCIUM 20 MG PO TABS
20.0000 mg | ORAL_TABLET | Freq: Every day | ORAL | Status: DC
  Administered 2012-12-30 – 2012-12-31 (×2): 20 mg via ORAL
  Filled 2012-12-30 (×2): qty 1

## 2012-12-30 MED ORDER — NITROGLYCERIN IN D5W 200-5 MCG/ML-% IV SOLN
5.0000 ug/min | INTRAVENOUS | Status: DC
  Administered 2012-12-30: 5 ug/min via INTRAVENOUS
  Filled 2012-12-30: qty 250

## 2012-12-30 MED ORDER — IPRATROPIUM BROMIDE 0.02 % IN SOLN: 0.5000 mg | Freq: Four times a day (QID) | RESPIRATORY_TRACT | Status: DC | PRN

## 2012-12-30 MED ORDER — SODIUM CHLORIDE 0.9 % IJ SOLN: 3.0000 mL | INTRAMUSCULAR | Status: DC | PRN

## 2012-12-30 MED ORDER — ONDANSETRON HCL 4 MG/2ML IJ SOLN: 4.0000 mg | Freq: Four times a day (QID) | INTRAMUSCULAR | Status: DC | PRN

## 2012-12-30 MED ORDER — SODIUM CHLORIDE 0.9 % IV SOLN: Freq: Once | INTRAVENOUS | Status: DC

## 2012-12-30 MED ORDER — CHOLECALCIFEROL 25 MCG (1000 UT) PO TABS: 1000.0000 [IU] | ORAL_TABLET | Freq: Every day | ORAL | Status: DC

## 2012-12-30 MED ORDER — WARFARIN SODIUM 5 MG PO TABS: 5.0000 mg | ORAL_TABLET | Freq: Every day | ORAL | Status: DC

## 2012-12-30 MED ORDER — ACETAMINOPHEN 325 MG PO TABS: 650.0000 mg | ORAL_TABLET | ORAL | Status: DC | PRN

## 2012-12-30 MED ORDER — NITROGLYCERIN 0.4 MG SL SUBL: 0.4000 mg | SUBLINGUAL_TABLET | SUBLINGUAL | Status: DC | PRN

## 2012-12-30 MED ORDER — ISOSORBIDE MONONITRATE ER 60 MG PO TB24
120.0000 mg | ORAL_TABLET | Freq: Every day | ORAL | Status: DC
  Administered 2012-12-30 – 2012-12-31 (×2): 120 mg via ORAL
  Filled 2012-12-30 (×2): qty 2

## 2012-12-30 MED ORDER — VITAMIN B-12 1000 MCG PO TABS: 1000.0000 ug | ORAL_TABLET | Freq: Every day | ORAL | Status: DC

## 2012-12-30 MED ORDER — SODIUM CHLORIDE 0.9 % IV SOLN
INTRAVENOUS | Status: AC
  Administered 2012-12-30: 10 mL/h via INTRAVENOUS

## 2012-12-30 MED ORDER — ENOXAPARIN SODIUM 30 MG/0.3ML ~~LOC~~ SOLN: 30.0000 mg | SUBCUTANEOUS | Status: DC

## 2012-12-30 MED ORDER — WARFARIN SODIUM 7.5 MG PO TABS
7.5000 mg | ORAL_TABLET | ORAL | Status: AC
  Filled 2012-12-30: qty 1

## 2012-12-30 MED ORDER — VITAMIN B-12 1000 MCG PO TABS
1000.0000 ug | ORAL_TABLET | Freq: Every day | ORAL | Status: DC
  Administered 2012-12-30 – 2012-12-31 (×2): 1000 ug via ORAL
  Filled 2012-12-30 (×2): qty 1

## 2012-12-30 MED ORDER — SODIUM CHLORIDE 0.9 % IJ SOLN
3.0000 mL | Freq: Two times a day (BID) | INTRAMUSCULAR | Status: DC
  Administered 2012-12-30 – 2012-12-31 (×2): 3 mL via INTRAVENOUS

## 2012-12-30 MED ORDER — VITAMIN D3 25 MCG (1000 UNIT) PO TABS
1000.0000 [IU] | ORAL_TABLET | Freq: Every day | ORAL | Status: DC
  Administered 2012-12-30 – 2012-12-31 (×2): 1000 [IU] via ORAL
  Filled 2012-12-30 (×2): qty 1

## 2012-12-30 MED ORDER — CLONIDINE HCL 0.2 MG PO TABS
0.2000 mg | ORAL_TABLET | Freq: Three times a day (TID) | ORAL | Status: DC
  Administered 2012-12-30 – 2012-12-31 (×4): 0.2 mg via ORAL
  Filled 2012-12-30 (×5): qty 1

## 2012-12-30 MED ORDER — FLUTICASONE FUROATE-VILANTEROL 100-25 MCG/INH IN AEPB: 1.0000 | INHALATION_SPRAY | Freq: Every day | RESPIRATORY_TRACT | Status: DC

## 2012-12-30 MED ORDER — BUDESONIDE-FORMOTEROL FUMARATE 160-4.5 MCG/ACT IN AERO
2.0000 | INHALATION_SPRAY | Freq: Two times a day (BID) | RESPIRATORY_TRACT | Status: DC
  Administered 2012-12-30 – 2012-12-31 (×2): 2 via RESPIRATORY_TRACT
  Filled 2012-12-30: qty 6

## 2012-12-30 MED ORDER — HYDRALAZINE HCL 25 MG PO TABS
75.0000 mg | ORAL_TABLET | Freq: Three times a day (TID) | ORAL | Status: DC
  Administered 2012-12-30 – 2012-12-31 (×4): 75 mg via ORAL
  Filled 2012-12-30 (×6): qty 1

## 2012-12-30 MED ORDER — HYDROCODONE-ACETAMINOPHEN 5-325 MG PO TABS: 1.0000 | ORAL_TABLET | Freq: Four times a day (QID) | ORAL | Status: DC | PRN

## 2012-12-30 MED ORDER — ALLOPURINOL 300 MG PO TABS
300.0000 mg | ORAL_TABLET | Freq: Every day | ORAL | Status: DC
Start: 2012-12-30 — End: 2012-12-31
  Administered 2012-12-30 – 2012-12-31 (×2): 300 mg via ORAL
  Filled 2012-12-30 (×2): qty 1

## 2012-12-30 MED ORDER — LABETALOL HCL 200 MG PO TABS
200.0000 mg | ORAL_TABLET | Freq: Two times a day (BID) | ORAL | Status: DC
  Administered 2012-12-30 – 2012-12-31 (×2): 200 mg via ORAL
  Filled 2012-12-30 (×3): qty 1

## 2012-12-30 MED ORDER — FUROSEMIDE 10 MG/ML IJ SOLN
40.0000 mg | Freq: Every day | INTRAMUSCULAR | Status: DC
  Administered 2012-12-30 – 2012-12-31 (×2): 40 mg via INTRAVENOUS
  Filled 2012-12-30: qty 4

## 2012-12-30 MED ORDER — WARFARIN SODIUM 5 MG PO TABS
5.0000 mg | ORAL_TABLET | ORAL | Status: AC
  Administered 2012-12-30: 5 mg via ORAL
  Filled 2012-12-30: qty 1

## 2012-12-30 NOTE — Progress Notes (Signed)
Patient's manual blood pressure 180/98.  Mid-level text paged x1.  MD contacted about blood pressure and nitroglycerin drip.  Received orders to transfer patient to stepdown.

## 2012-12-30 NOTE — ED Notes (Signed)
Pt resting quietly, wife at bedside. Pt states that his breathing is better and still denies any pain

## 2012-12-30 NOTE — Progress Notes (Signed)
CODE STEMI CANCELLED  Code stemi activated per EMS - on arrival, pt with no chest pain, reports that he was SOB tonight.  He was seen by PCP today for SOB and coughing up brown phlegm - he was dx with COPD exac.  Pt with hx of NICM and DM.  Last cath with nonobstructive CAD, pt says this was done 2-3 years ago.  EKG with sinus tach, IVCD, but no ST elevations - unchanged compared to EKG 04/14/11  Discussed with Dr. [name], on call interventional attending, who also reviewed the EKG  Code stemi is cancelled given non-diagnostic EKG and pt without chest pain

## 2012-12-30 NOTE — Consult Note (Signed)
CARDIOLOGY CONSULT NOTE   KELSIE KRAMP MRN: [id number] DOB/AGE: Apr 26, 1941 72 y.o. Admit date: 12/30/2012  Primary Cardiologist: Dr. Antoine Poche  Reason for consultation:  Heart failure management  HPI:  Pt is a 72 yo man with NICM EF 35-40%, DM, COPD who presents with SOB and prod cough.  He saw his PCP today and was dx with COPD exacerbation.  He came to the ED tonight for worsening sx.  EMS activated STEMI pager, but EKG determined not to be STEMI and pt with no chest pain.  He reports that for the last week he has been SOB and coughing.  The cough is productive of brown phlegm.  No fevers.  No sick contacts.  He has never had a flu shot.  His sx are worse at night.  He has been compliant with his lasix.  He does miss an occasional dose of his other medications if he falls asleep without taking them.  He denies chest pain.  He does not eat salt and has not eaten anything salty recently.  His weight is up 2 lbs from his baseline.  He denies LE edema above his baseline.  He has no other acute complaints.  Pt and wife were surprised to hear that he has dx of COPD in his chart because pt quit smoking many years ago and was not a significant smoker.      Review of systems: A review of 10 organ systems was done and is negative except as stated above in HPI  Past Medical History  Diagnosis Date  . Gout   . HTN (hypertension)   . Renal insufficiency   . Hyperlipidemia   . Osteoarthritis   . History of CVA (cerebrovascular accident)     Left pontine infarct July 2004; changed from Plavix to Coumadin in 2004 per MD at [facility]  . GERD (gastroesophageal reflux disease)   . DJD (degenerative joint disease)   . CHF (congestive heart failure)     35 - 40% Echo 4/12  . BPH (benign prostatic hypertrophy)   . Type II or unspecified type diabetes mellitus without mention of complication, not stated as uncontrolled   . CAD (coronary artery disease)     Non obstructive   Past Surgical History    Procedure Date  . None    History   Social History  . Marital Status: Married    Spouse Name: N/A    Number of Children: N/A  . Years of Education: N/A   Occupational History  . Retired - Working in Product manager    Social History Main Topics  . Smoking status: Former Smoker    Quit date: 04/12/1974  . Smokeless tobacco: Never Used  . Alcohol Use: No  . Drug Use: No  . Sexually Active: Not on file   Other Topics Concern  . Not on file   Social History Narrative  . No narrative on file    Family History  Problem Relation Age of Onset  . Hypertension Mother   . Heart disease Mother   . Diabetes Mother   . Gout Other   . Stroke Other   . Hypertension Father   . Heart disease Father      Allergies  Allergen Reactions  . Ace Inhibitors     REACTION: cough     (Not in a hospital admission)  Current facility-administered medications:0.9 %  sodium chloride infusion, , Intravenous, Once, Vida Roller, MD Current outpatient prescriptions:allopurinol (ZYLOPRIM) 300 MG  tablet, Take 300 mg by mouth daily.  , Disp: , Rfl: ;  amLODipine (NORVASC) 10 MG tablet, Take 1 tablet (10 mg total) by mouth daily., Disp: 30 tablet, Rfl: 11;  amoxicillin (AMOXIL) 500 MG capsule, Take 2 capsules (1,000 mg total) by mouth 2 (two) times daily., Disp: 40 capsule, Rfl: 0 atorvastatin (LIPITOR) 20 MG tablet, TAKE 1 TABLET EVERY DAY FOR CHOLESTEROL, Disp: 30 tablet, Rfl: 6;  budesonide-formoterol (SYMBICORT) 160-4.5 MCG/ACT inhaler, Inhale 2 puffs into the lungs 2 (two) times daily., Disp: , Rfl: ;  Cholecalciferol 1000 UNITS tablet, Take 1,000 Units by mouth daily.  , Disp: , Rfl: ;  cloNIDine (CATAPRES) 0.2 MG tablet, Take 1 tablet (0.2 mg total) by mouth 3 (three) times daily., Disp: 90 tablet, Rfl: 8 clotrimazole-betamethasone (LOTRISONE) cream, Apply topically 2 (two) times daily as needed. Fungal infection, Disp: , Rfl: ;  cyanocobalamin 1000 MCG tablet, Take 100 mcg by mouth daily.  , Disp: ,  Rfl: ;  doxazosin (CARDURA) 1 MG tablet, TAKE 1 TABLET EVERY DAY, Disp: 30 tablet, Rfl: 2;  Fluticasone Furoate-Vilanterol (BREO ELLIPTA) 100-25 MCG/INH AEPB, Inhale 1 Act into the lungs daily., Disp: 1 each, Rfl: 5 furosemide (LASIX) 40 MG tablet, TAKE 1 TABLET EVERY DAY, Disp: 30 tablet, Rfl: 5;  hydrALAZINE (APRESOLINE) 50 MG tablet, TAKE 1 AND 1/2 TABLETS THREE TIMES A DAY, Disp: 135 tablet, Rfl: 8;  HYDROcodone-acetaminophen (VICODIN) 5-500 MG per tablet, Take 1 tablet by mouth 4 (four) times daily as needed., Disp: 90 tablet, Rfl: 0 isosorbide mononitrate (IMDUR) 120 MG 24 hr tablet, TAKE 1 TABLET (120 MG TOTAL) BY MOUTH EVERY MORNING., Disp: 30 tablet, Rfl: 8;  KLOR-CON M20 20 MEQ tablet, TAKE 1 TABLET BY MOUTH TWICE DAILY., Disp: 60 tablet, Rfl: 5;  labetalol (NORMODYNE) 200 MG tablet, Take 200 mg by mouth 2 (two) times daily.  , Disp: , Rfl: ;  metFORMIN (GLUCOPHAGE-XR) 500 MG 24 hr tablet, TAKE 1 TABLET (500 MG TOTAL) BY MOUTH DAILY WITH BREAKFAST., Disp: 90 tablet, Rfl: 1 NITROSTAT 0.4 MG SL tablet, Place 0.4 mg under the tongue every 5 (five) minutes as needed. , Disp: , Rfl: ;  promethazine-codeine (PHENERGAN WITH CODEINE) 6.25-10 MG/5ML syrup, Take 5 mLs by mouth every 4 (four) hours as needed for cough., Disp: 240 mL, Rfl: 0;  triamcinolone cream (KENALOG) 0.5 %, APPLY TO AFFECTED AREA TWICE DAILY, Disp: 270 g, Rfl: 0 warfarin (COUMADIN) 5 MG tablet, Take 5-7.5 mg by mouth daily. Takes 5 mg every other day, on the 'other day' take 7.5 mg, Disp: , Rfl:   Physical Exam: Blood pressure 186/125, pulse 72, resp. rate 20, SpO2 100.00%.; There is no height or weight on file to calculate BMI. Temp:  [97.5 F (36.4 C)] 97.5 F (36.4 C) (01/10 1308) Pulse Rate:  [68-97] 72  (01/11 0415) Resp:  [18-31] 20  (01/11 0415) BP: (148-186)/(98-125) 186/125 mmHg (01/11 0254) SpO2:  [96 %-100 %] 100 % (01/11 0415) Weight:  [103.42 kg (228 lb)] 103.42 kg (228 lb) (01/10 1308)  No intake or output data in  the 24 hours ending 12/30/12 0431 General: NAD Heent: MMM Neck: JVP 11cm CV: RRR, no m Lungs: Clear to auscultation bilaterally with normal respiratory effort Abdomen: Soft, nontender, nondistended Extremities: trace pedal edema bilat Skin: Intact without lesions or rashes  Neurologic: Alert and oriented x 3, grossly nonfocal  Psych: Normal mood and affect    Labs: No results found for this basename: CKTOTAL:4,CKMB:4,TROPONINI:4 in the last 72 hours Lab  Results  Component Value Date   WBC 9.0 12/30/2012   HGB 12.2* 12/30/2012   HCT 36.0* 12/30/2012   MCV 79.0 12/30/2012   PLT 285 12/30/2012    Lab 12/30/12 0310 12/30/12 0258  NA 146* --  K 3.3* --  CL 108 --  CO2 -- 24  BUN 16 --  CREATININE 1.50* --  CALCIUM -- 9.2  PROT -- --  BILITOT -- --  ALKPHOS -- --  ALT -- --  AST -- --  GLUCOSE 143* --   Lab Results  Component Value Date   CHOL 188 11/18/2011   HDL 61.10 11/18/2011   LDLCALC 104* 11/18/2011   TRIG 117.0 11/18/2011      Radiology:  Dg Chest 2 View  12/29/2012  *RADIOLOGY REPORT*  Clinical Data: Short of breath  CHEST - 2 VIEW  Comparison: 04/14/2011  Findings: Cardiac enlargement with normal pulmonary vascularity. Small pleural effusions are present.  Negative for pneumonia.  COPD with pulmonary hyperinflation.  IMPRESSION: COPD.  Small pleural effusions without evidence of vascular congestion.   Original Report Authenticated By: Janeece Riggers, M.D.    Dg Chest Port 1 View  12/30/2012  *RADIOLOGY REPORT*  Clinical Data: Chest pain, shortness of breath.  PORTABLE CHEST - 1 VIEW  Comparison: 12/29/2012  Findings: Cardiomegaly.  Diffuse interstitial prominence, increasing since prior study, likely mild interstitial edema. Hyperinflation compatible with COPD.  No effusions.  IMPRESSION: Increasing interstitial prominence, likely early interstitial edema.  COPD.   Original Report Authenticated By: Charlett Nose, M.D.     ASSESSMENT:  Pt is a 72 yo man with NICM EF  35-40%, DM, COPD who presents with SOB and prod cough  Heart failure exacerbation - pt with mildly elevated BNP and mild edema on CXR with SOB, his exam is consistent with very mild fluid overload - his sx seem out of proportion to the amount of fluid he has on his body from HF.  His SOB is likely multifactorial, with some pulmonary component contributing  RECOMMENDATIONS: - give 40 mg IV lasix daily (double his home dose) - ordered - target 1-1.5 L negative fluid balance - daily wts - strict I/Os - will switch back to pt's PO lasix regimen when he is euvolemic and resp status is improved - likely after tomorrow given that he only has very mild exacerbation - work up and treatment of COPD exac or other pulmonary cause of his SOB per primary team - finish cycling cardiac enzymes   Thank you for this consultation.  Will continue to follow.  Signed: Hilary Hertz, MD Cardiology Fellow 12/30/2012, 4:31 AM

## 2012-12-30 NOTE — Progress Notes (Signed)
0730 BP 151/87 on ntg  5 mcq /min =1.5 ml/hr  No cp / sob /no pain . Made Dr . Rito Ehrlich aware / spoken with courtney ,[place]  . Transfer order cancelled this time . Monitoring pt continued

## 2012-12-30 NOTE — ED Provider Notes (Signed)
History     CSN: [id number]  Arrival date & time 12/30/12  0248   First MD Initiated Contact with Patient 12/30/12 0255      Chief Complaint  Patient presents with  . Chest Pain    (Consider location/radiation/quality/duration/timing/severity/associated sxs/prior treatment) HPI Comments: 72 year old male with a history of nonischemic cardiomyopathy as a heart catheterization in 2012, ejection fraction of 35-40% as of April 2012. He also has a history of hypertension, diabetes, stroke on chronic Coumadin therapy and acute renal failure. The patient was seen at his family doctor's office today for shortness of breath and a cough and diagnosed with bronchitis. An x-ray was performed earlier in the day which showed small effusions without any pulmonary edema. He states the shortness of breath has gradually been getting worse but this evening it was much worse and he had associated orthopnea which prevented him from lying flat. He has been becoming dyspneic on exertion as well. The symptoms are persistent, gradually worsening and when paramedics arrived on the scene they obtained an EKG which was read by the computer as ST elevation MI. Cardiology was present on arrival and agreed that this was not an ST elevation MI. The patient denies any fevers, chills or significant changes in swelling of his legs though he does note slight increase in his right lower extremity over the left lower extremity as a chronic condition.  Patient is a 72 y.o. male presenting with chest pain. The history is provided by the patient, medical records and the EMS personnel.  Chest Pain     Past Medical History  Diagnosis Date  . Gout   . HTN (hypertension)   . Renal insufficiency   . Hyperlipidemia   . Osteoarthritis   . History of CVA (cerebrovascular accident)     Left pontine infarct July 2004; changed from Plavix to Coumadin in 2004 per MD at [facility]  . GERD (gastroesophageal reflux disease)   . DJD  (degenerative joint disease)   . CHF (congestive heart failure)     35 - 40% Echo 4/12  . BPH (benign prostatic hypertrophy)   . Type II or unspecified type diabetes mellitus without mention of complication, not stated as uncontrolled   . CAD (coronary artery disease)     Non obstructive    Past Surgical History  Procedure Date  . None     Family History  Problem Relation Age of Onset  . Hypertension Mother   . Heart disease Mother   . Diabetes Mother   . Gout Other   . Stroke Other   . Hypertension Father   . Heart disease Father     History  Substance Use Topics  . Smoking status: Former Smoker    Quit date: 04/12/1974  . Smokeless tobacco: Never Used  . Alcohol Use: No      Review of Systems  Cardiovascular: Positive for chest pain.  All other systems reviewed and are negative.    Allergies  Ace inhibitors  Home Medications   Current Outpatient Rx  Name  Route  Sig  Dispense  Refill  . ALLOPURINOL 300 MG PO TABS   Oral   Take 300 mg by mouth daily.           [name] AMLODIPINE BESYLATE 10 MG PO TABS   Oral   Take 1 tablet (10 mg total) by mouth daily.   30 tablet   11     90 day supply is acceptable   . AMOXICILLIN  500 MG PO CAPS   Oral   Take 2 capsules (1,000 mg total) by mouth 2 (two) times daily.   40 capsule   0   . ATORVASTATIN CALCIUM 20 MG PO TABS      TAKE 1 TABLET EVERY DAY FOR CHOLESTEROL   30 tablet   6   . BUDESONIDE-FORMOTEROL FUMARATE 160-4.5 MCG/ACT IN AERO   Inhalation   Inhale 2 puffs into the lungs 2 (two) times daily.         . CHOLECALCIFEROL 1000 UNITS PO TABS   Oral   Take 1,000 Units by mouth daily.           [name] CLONIDINE HCL 0.2 MG PO TABS   Oral   Take 1 tablet (0.2 mg total) by mouth 3 (three) times daily.   90 tablet   8     90 day supply is acceptable   . CLOTRIMAZOLE-BETAMETHASONE 1-0.05 % EX CREA   Topical   Apply topically 2 (two) times daily as needed. Fungal infection         .  CYANOCOBALAMIN 1000 MCG PO TABS   Oral   Take 100 mcg by mouth daily.           [name] DOXAZOSIN MESYLATE 1 MG PO TABS      TAKE 1 TABLET EVERY DAY   30 tablet   2   . FLUTICASONE FUROATE-VILANTEROL 100-25 MCG/INH IN AEPB   Inhalation   Inhale 1 Act into the lungs daily.   1 each   5   . FUROSEMIDE 40 MG PO TABS      TAKE 1 TABLET EVERY DAY   30 tablet   5   . HYDRALAZINE HCL 50 MG PO TABS      TAKE 1 AND 1/2 TABLETS THREE TIMES A DAY   135 tablet   8   . HYDROCODONE-ACETAMINOPHEN 5-500 MG PO TABS   Oral   Take 1 tablet by mouth 4 (four) times daily as needed.   90 tablet   0   . ISOSORBIDE MONONITRATE ER 120 MG PO TB24      TAKE 1 TABLET (120 MG TOTAL) BY MOUTH EVERY MORNING.   30 tablet   8   . KLOR-CON M20 20 MEQ PO TBCR      TAKE 1 TABLET BY MOUTH TWICE DAILY.   60 tablet   5   . LABETALOL HCL 200 MG PO TABS   Oral   Take 200 mg by mouth 2 (two) times daily.           [name] METFORMIN HCL ER 500 MG PO TB24      TAKE 1 TABLET (500 MG TOTAL) BY MOUTH DAILY WITH BREAKFAST.   90 tablet   1   . NITROSTAT 0.4 MG SL SUBL   Sublingual   Place 0.4 mg under the tongue every 5 (five) minutes as needed.          [name] PROMETHAZINE-CODEINE 6.25-10 MG/5ML PO SYRP   Oral   Take 5 mLs by mouth every 4 (four) hours as needed for cough.   240 mL   0   . TRIAMCINOLONE ACETONIDE 0.5 % EX CREA      APPLY TO AFFECTED AREA TWICE DAILY   270 g   0   . WARFARIN SODIUM 5 MG PO TABS   Oral   Take 5-7.5 mg by mouth daily. Takes 5 mg every other day, on the 'other day' take 7.5  mg           BP 186/125  Pulse 72  Resp 20  SpO2 100%  Physical Exam  Nursing note and vitals reviewed. Constitutional: He appears well-developed and well-nourished.  HENT:  Head: Normocephalic and atraumatic.  Mouth/Throat: Oropharynx is clear and moist. No oropharyngeal exudate.  Eyes: Conjunctivae normal and EOM are normal. Pupils are equal, round, and reactive to light. Right eye  exhibits no discharge. Left eye exhibits no discharge. No scleral icterus.  Neck: Normal range of motion. Neck supple. No JVD present. No thyromegaly present.  Cardiovascular: Normal rate, regular rhythm, normal heart sounds and intact distal pulses.  Exam reveals no gallop and no friction rub.   No murmur heard. Pulmonary/Chest: Breath sounds normal. He is in respiratory distress. He has no wheezes. He has no rales.       Increased work of breathing, speaks in short sentences, no obvious rales or wheezing, tachypnea present  Abdominal: Soft. Bowel sounds are normal. He exhibits no distension and no mass. There is no tenderness.  Musculoskeletal: Normal range of motion. He exhibits edema ( Slight asymmetry and peripheral edema of the right lower extremity). He exhibits no tenderness.  Lymphadenopathy:    He has no cervical adenopathy.  Neurological: He is alert. Coordination normal.  Skin: Skin is warm and dry. No rash noted. No erythema.  Psychiatric: He has a normal mood and affect. His behavior is normal.    ED Course  Procedures (including critical care time)  Labs Reviewed  BASIC METABOLIC PANEL - Abnormal; Notable for the following:    Potassium 3.3 (*)     Glucose, Bld 157 (*)     Creatinine, Ser 1.50 (*)     GFR calc non Af Amer 44 (*)     GFR calc Af Amer 52 (*)     All other components within normal limits  CBC WITH DIFFERENTIAL - Abnormal; Notable for the following:    Hemoglobin 11.6 (*)     HCT 36.2 (*)     MCH 25.3 (*)     Neutrophils Relative 79 (*)     All other components within normal limits  APTT - Abnormal; Notable for the following:    aPTT 63 (*)     All other components within normal limits  PROTIME-INR - Abnormal; Notable for the following:    Prothrombin Time 24.5 (*)     INR 2.33 (*)     All other components within normal limits  PRO B NATRIURETIC PEPTIDE - Abnormal; Notable for the following:    Pro B Natriuretic peptide (BNP) 863.8 (*)     All  other components within normal limits  POCT I-STAT, CHEM 8 - Abnormal; Notable for the following:    Sodium 146 (*)     Potassium 3.3 (*)     Creatinine, Ser 1.50 (*)     Glucose, Bld 143 (*)     Hemoglobin 12.2 (*)     HCT 36.0 (*)     All other components within normal limits  POCT I-STAT TROPONIN I   Dg Chest 2 View  12/29/2012  *RADIOLOGY REPORT*  Clinical Data: Short of breath  CHEST - 2 VIEW  Comparison: 04/14/2011  Findings: Cardiac enlargement with normal pulmonary vascularity. Small pleural effusions are present.  Negative for pneumonia.  COPD with pulmonary hyperinflation.  IMPRESSION: COPD.  Small pleural effusions without evidence of vascular congestion.   Original Report Authenticated By: Janeece Riggers, M.D.  Dg Chest Port 1 View  12/30/2012  *RADIOLOGY REPORT*  Clinical Data: Chest pain, shortness of breath.  PORTABLE CHEST - 1 VIEW  Comparison: 12/29/2012  Findings: Cardiomegaly.  Diffuse interstitial prominence, increasing since prior study, likely mild interstitial edema. Hyperinflation compatible with COPD.  No effusions.  IMPRESSION: Increasing interstitial prominence, likely early interstitial edema.  COPD.   Original Report Authenticated By: Charlett Nose, M.D.      1. Congestive heart failure   2. Hypertension       MDM  Increased work of breathing, EKG is abnormal with a wide QRS complex however this appears to be consistent with prior EKGs. Will continue workup for congestive heart failure exacerbation, possible COPD, other sources of shortness of breath including pneumonia or myocardial infarction. The cardiologist has seen the patient on arrival and agrees to discontinue the activation of the catheterization lab at this time.  ED ECG REPORT  I personally interpreted this EKG   Date: 12/30/2012   Rate: 97  Rhythm: normal sinus rhythm and premature ventricular contractions (PVC)  QRS Axis: left  Intervals: Widened QRS  ST/T Wave abnormalities: nonspecific ST/T  changes  Conduction Disutrbances:left bundle branch block  Narrative Interpretation:   Old EKG Reviewed: changes noted compared to 04/28/2011, rate increased and ectopy no present, wide QRS complexes persist.  Discussed with cardiologist after laboratory data and chest x-ray reviewed, they agree with admission to the hospital and they will provide a cardiology consultation, hospitalist will admit the patient, holding orders written, Lasix given, patient appears persistently dyspneic and hypertensive.  Nitro gtt started.  Clonidine missed last night - ordered, nitro gtt ordered for CHF and hypertension - last measured at 180/110.    CRITICAL CARE Performed by: Vida Roller   Total critical care time: 35  Critical care time was exclusive of separately billable procedures and treating other patients.  Critical care was necessary to treat or prevent imminent or life-threatening deterioration.  Critical care was time spent personally by me on the following activities: development of treatment plan with patient and/or surrogate as well as nursing, discussions with consultants, evaluation of patient's response to treatment, examination of patient, obtaining history from patient or surrogate, ordering and performing treatments and interventions, ordering and review of laboratory studies, ordering and review of radiographic studies, pulse oximetry and re-evaluation of patient's condition.   Vida Roller, MD 12/30/12 [phone]

## 2012-12-30 NOTE — Progress Notes (Signed)
Frank Mclean was a Stemi-Code.  The Code was cancelled.  Patient was resting and in good spirits.

## 2012-12-30 NOTE — Progress Notes (Signed)
Patient admitted to 71.  Patient oriented to the unit and assessed.  Heart failure packet at bedside.  Call bell placed within reach.  RN will continue to monitor the patient.

## 2012-12-30 NOTE — ED Notes (Signed)
Cardiology in to see patient.

## 2012-12-30 NOTE — ED Notes (Signed)
Pt could not lay down to sleep tonight states it made him shob

## 2012-12-30 NOTE — Progress Notes (Signed)
ANTICOAGULATION CONSULT NOTE - Follow Up Consult  Pharmacy Consult for warfarin Indication: atrial fibrillation  Allergies  Allergen Reactions  . Ace Inhibitors     REACTION: cough    Patient Measurements: Height: 6\' 2"  (188 cm) Weight: 224 lb 8 oz (101.833 kg) (SCALE b) IBW/kg (Calculated) : 82.2   Vital Signs: Temp: 97.5 F (36.4 C) (01/11 0535) Temp src: Oral (01/11 0535) BP: 140/93 mmHg (01/11 1107) Pulse Rate: 67  (01/11 1107)  Labs:  Basename 12/30/12 0841 12/30/12 0310 12/30/12 0258  HGB -- 12.2* 11.6*  HCT -- 36.0* 36.2*  PLT -- -- 285  APTT -- -- 63*  LABPROT -- -- 24.5*  INR -- -- 2.33*  HEPARINUNFRC -- -- --  CREATININE -- 1.50* 1.50*  CKTOTAL -- -- --  CKMB -- -- --  TROPONINI <0.30 -- --    Estimated Creatinine Clearance: 55.8 ml/min (by C-G formula based on Cr of 1.5).   Medications:  Prescriptions prior to admission  Medication Sig Dispense Refill  . allopurinol (ZYLOPRIM) 300 MG tablet Take 300 mg by mouth daily.        [name] amLODipine (NORVASC) 10 MG tablet Take 1 tablet (10 mg total) by mouth daily.  30 tablet  11  . amoxicillin (AMOXIL) 500 MG capsule Take 2 capsules (1,000 mg total) by mouth 2 (two) times daily.  40 capsule  0  . atorvastatin (LIPITOR) 20 MG tablet TAKE 1 TABLET EVERY DAY FOR CHOLESTEROL  30 tablet  6  . budesonide-formoterol (SYMBICORT) 160-4.5 MCG/ACT inhaler Inhale 2 puffs into the lungs 2 (two) times daily.      . Cholecalciferol 1000 UNITS tablet Take 1,000 Units by mouth daily.        . cloNIDine (CATAPRES) 0.2 MG tablet Take 1 tablet (0.2 mg total) by mouth 3 (three) times daily.  90 tablet  8  . clotrimazole-betamethasone (LOTRISONE) cream Apply topically 2 (two) times daily as needed. Fungal infection      . cyanocobalamin 1000 MCG tablet Take 100 mcg by mouth daily.        [name] doxazosin (CARDURA) 1 MG tablet TAKE 1 TABLET EVERY DAY  30 tablet  2  . Fluticasone Furoate-Vilanterol (BREO ELLIPTA) 100-25 MCG/INH AEPB Inhale  1 Act into the lungs daily.  1 each  5  . furosemide (LASIX) 40 MG tablet TAKE 1 TABLET EVERY DAY  30 tablet  5  . hydrALAZINE (APRESOLINE) 50 MG tablet TAKE 1 AND 1/2 TABLETS THREE TIMES A DAY  135 tablet  8  . HYDROcodone-acetaminophen (VICODIN) 5-500 MG per tablet Take 1 tablet by mouth 4 (four) times daily as needed.  90 tablet  0  . isosorbide mononitrate (IMDUR) 120 MG 24 hr tablet TAKE 1 TABLET (120 MG TOTAL) BY MOUTH EVERY MORNING.  30 tablet  8  . KLOR-CON M20 20 MEQ tablet TAKE 1 TABLET BY MOUTH TWICE DAILY.  60 tablet  5  . labetalol (NORMODYNE) 200 MG tablet Take 200 mg by mouth 2 (two) times daily.        . metFORMIN (GLUCOPHAGE-XR) 500 MG 24 hr tablet TAKE 1 TABLET (500 MG TOTAL) BY MOUTH DAILY WITH BREAKFAST.  90 tablet  1  . NITROSTAT 0.4 MG SL tablet Place 0.4 mg under the tongue every 5 (five) minutes as needed.       . promethazine-codeine (PHENERGAN WITH CODEINE) 6.25-10 MG/5ML syrup Take 5 mLs by mouth every 4 (four) hours as needed for cough.  240 mL  0  .  triamcinolone cream (KENALOG) 0.5 % APPLY TO AFFECTED AREA TWICE DAILY  270 g  0  . warfarin (COUMADIN) 5 MG tablet Take 5-7.5 mg by mouth daily. Takes 5 mg every other day, on the 'other day' take 7.5 mg        Assessment: 71 y/oM on Coumadin PTA for afib. Home dose is alternating 5 mg and 7.5 mg daily.  Pt said he took 7.5 last night.  INR on admit 2.33, therapeutic.  No bleeding noted.    Goal of Therapy:  INR 2-3 Monitor platelets by anticoagulation protocol: Yes   Plan:  1. Warfarin 5 mg x1 tonight 2. Warfarin 7.5 mg x1 tomorrow night (1/12) 3. Daily INR   Doris Cheadle, PharmD Clinical Pharmacist Pager: [phone] Phone: [phone] 12/30/2012 1:13 PM

## 2012-12-30 NOTE — ED Notes (Signed)
Pt given 324mg  of aspirin, ntg x 3, morphine 4mg  IV pta

## 2012-12-30 NOTE — ED Notes (Signed)
Wife to bedside  

## 2012-12-30 NOTE — H&P (Signed)
[facility] History and Physical  Frank Mclean:[id number] DOB: [birth year] DOA: 12/30/2012  Referring physician: Eber Mclean, ER physician PCP: Frank Primes, MD  Specialists: Frank Mclean cardiology  Chief Complaint: Shortness of breath  HPI: Frank Mclean is a 72 y.o. 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 He had been to his primary care physician's office the day prior and was being treated for bronchitis for possible COPD. When his symptoms persisted, came into the emergency room. Seen on x-ray with interstitial edema. Patient had a BNP checked and found to be moderately elevated around 900. Started on IV Lasix and Ardee has diuresed some. Patient was admitted to the floor.  Review of Systems: When I saw the patient after he arrives to the floor, he was feeling better. He was much shorter breath. Denies any headaches, vision changes, dysphasia, chest pain, palpitations, wheezing, cough, abdominal pain, hematuria, dysuria, constipation, diarrhea, focal extremity numbness or weakness or pain. His review systems otherwise negative.  Past Medical History  Diagnosis Date  . Gout   . HTN (hypertension)   . Renal insufficiency   . Hyperlipidemia   . Osteoarthritis   . History of CVA (cerebrovascular accident)     Left pontine infarct July 2004; changed from Plavix to Coumadin in 2004 per MD at [facility]  . GERD (gastroesophageal reflux disease)   . DJD (degenerative joint disease)   . CHF (congestive heart failure)     35 - 40% Echo 4/12  . BPH (benign prostatic hypertrophy)   . Type II or unspecified type diabetes mellitus without mention of complication, not stated as uncontrolled   . CAD (coronary artery disease)     Non obstructive   Past Surgical History  Procedure Date  . None    Social History:   reports that he quit smoking about 38 years ago. He has never used smokeless tobacco. He reports that he does not drink alcohol or use illicit drugs. Patient lives at home with wife. He normally ambulates without any assistance and participates in almost all activities of daily living  Allergies  Allergen Reactions  . Ace Inhibitors     REACTION: cough    Family History  Problem Relation Age of Onset  . Hypertension Mother   . Heart disease Mother   . Diabetes Mother   . Gout Other   . Stroke Other   . Hypertension Father   . Heart disease Father     Prior to Admission medications   Medication Sig Start Date End Date Taking? Authorizing Provider  allopurinol (ZYLOPRIM) 300 MG tablet Take 300 mg by mouth daily.     Yes Historical Provider, MD  amLODipine (NORVASC) 10 MG tablet Take 1 tablet (10 mg total) by mouth daily. 05/04/12  Yes Rollene Rotunda, MD  amoxicillin (AMOXIL) 500 MG capsule Take 2 capsules (1,000 mg total) by mouth 2 (two) times daily. 12/27/12  Yes Georgina Quint Plotnikov, MD  atorvastatin (LIPITOR) 20 MG tablet TAKE 1 TABLET EVERY DAY FOR CHOLESTEROL 10/03/12  Yes Tresa Garter, MD  budesonide-formoterol (SYMBICORT) 160-4.5 MCG/ACT inhaler Inhale 2 puffs into the lungs 2 (two) times daily. 04/13/11  Yes Tresa Garter, MD  Cholecalciferol 1000 UNITS tablet Take 1,000 Units by mouth daily.     Yes Historical Provider, MD  cloNIDine (CATAPRES) 0.2 MG tablet Take 1 tablet (0.2  mg total) by mouth 3 (three) times daily. 05/17/12  Yes Rollene Rotunda, MD  clotrimazole-betamethasone (LOTRISONE) cream Apply topically 2 (two) times daily as needed. Fungal infection   Yes Historical Provider, MD  cyanocobalamin 1000 MCG tablet Take 100 mcg by mouth daily.     Yes Historical Provider, MD  doxazosin (CARDURA) 1 MG tablet TAKE 1 TABLET EVERY DAY 09/04/11  Yes Georgina Quint Plotnikov, MD  Fluticasone Furoate-Vilanterol (BREO ELLIPTA) 100-25 MCG/INH AEPB Inhale 1 Act into the lungs  daily. 12/27/12  Yes Georgina Quint Plotnikov, MD  furosemide (LASIX) 40 MG tablet TAKE 1 TABLET EVERY DAY 09/27/12  Yes Georgina Quint Plotnikov, MD  hydrALAZINE (APRESOLINE) 50 MG tablet TAKE 1 AND 1/2 TABLETS THREE TIMES A DAY 05/31/12  Yes Rollene Rotunda, MD  HYDROcodone-acetaminophen (VICODIN) 5-500 MG per tablet Take 1 tablet by mouth 4 (four) times daily as needed. 06/01/12  Yes Georgina Quint Plotnikov, MD  isosorbide mononitrate (IMDUR) 120 MG 24 hr tablet TAKE 1 TABLET (120 MG TOTAL) BY MOUTH EVERY MORNING. 05/31/12  Yes Rollene Rotunda, MD  KLOR-CON M20 20 MEQ tablet TAKE 1 TABLET BY MOUTH TWICE DAILY. 10/03/12  Yes Georgina Quint Plotnikov, MD  labetalol (NORMODYNE) 200 MG tablet Take 200 mg by mouth 2 (two) times daily.     Yes Historical Provider, MD  metFORMIN (GLUCOPHAGE-XR) 500 MG 24 hr tablet TAKE 1 TABLET (500 MG TOTAL) BY MOUTH DAILY WITH BREAKFAST. 05/31/12  Yes Georgina Quint Plotnikov, MD  NITROSTAT 0.4 MG SL tablet Place 0.4 mg under the tongue every 5 (five) minutes as needed.  04/20/11  Yes Historical Provider, MD  promethazine-codeine (PHENERGAN WITH CODEINE) 6.25-10 MG/5ML syrup Take 5 mLs by mouth every 4 (four) hours as needed for cough. 12/27/12  Yes Georgina Quint Plotnikov, MD  triamcinolone cream (KENALOG) 0.5 % APPLY TO AFFECTED AREA TWICE DAILY 07/26/12  Yes Georgina Quint Plotnikov, MD  warfarin (COUMADIN) 5 MG tablet Take 5-7.5 mg by mouth daily. Takes 5 mg every other day, on the 'other day' take 7.5 mg   Yes Historical Provider, MD   Physical Exam: Filed Vitals:   12/30/12 0515 12/30/12 0535 12/30/12 0545 12/30/12 0730  BP: 172/101 154/102 180/93 151/87  Pulse: 71 75  74  Temp:  97.5 F (36.4 C)    TempSrc:  Oral    Resp: 19 18  18   Height:  6\' 2"  (1.88 m)    Weight:  101.833 kg (224 lb 8 oz)    SpO2: 100% 100%  100%     General:  Alert and oriented x3, no acute distress  Eyes: Sclera nonicteric, extraocular movements are intact  ENT: Normocephalic and atraumatic, mucous members are slightly  dry  Neck: No carotid bruits  Cardiovascular: Regular rate and rhythm, S1-S2  Respiratory: Clear to auscultation bilaterally  Abdomen: Soft, nontender, nondistended, positive bowel sounds  Skin: Skin breaks tears or lesions  Musculoskeletal: No clubbing or cyanosis, trace pitting edema  Psychiatric: No evidence of psychoses, patient is appropriate  Neurologic: No focal deficits  Labs on Admission:  Basic Metabolic Panel:  Lab 12/30/12 1610 12/30/12 0258  NA 146* 144  K 3.3* 3.3*  CL 108 106  CO2 -- 24  GLUCOSE 143* 157*  BUN 16 17  CREATININE 1.50* 1.50*  CALCIUM -- 9.2  MG -- --  PHOS -- --   CBC:  Lab 12/30/12 0310 12/30/12 0258  WBC -- 9.0  NEUTROABS -- 7.1  HGB 12.2* 11.6*  HCT 36.0* 36.2*  MCV --  79.0  PLT -- 285    BNP (last 3 results)  Basename 12/30/12 0259  PROBNP 863.8*   CBG:  Lab 12/30/12 0613  GLUCAP 133*    Radiological Exams on Admission: Dg Chest 2 View  12/29/2012   IMPRESSION: COPD.  Small pleural effusions without evidence of vascular congestion.   Original Report Authenticated By: Janeece Riggers, M.D.    Dg Chest Port 1 View  12/30/2012    IMPRESSION: Increasing interstitial prominence, likely early interstitial edema.  COPD.   Original Report Authenticated By: Charlett Nose, M.D.     EKG: Independently reviewed. Sinus tachycardia, some PVCs and  Assessment/Plan Principal Problem:  *Acute on chronic combined systolic and diastolic congestive heart failure: Scant echocardiogram done April 2012 minutes ejection fraction of 3540% EF with some possible diastolic dysfunction. We'll go ahead and recheck echocardiogram. Continue diuresis and monitor strict I.'s and O.'s. Patient has been told the past he has congestive heart failure, but they never been truly educated on chronic maintenance and monitoring. We'll attempt to do so now. Will also add that the hallways and check oxygen status. Likely can go home tomorrow. Cardiology  following. Active Problems:  DM (diabetes mellitus), type 2 with renal complications: Sliding scale. Had discontinued metformin given history CHF.  HYPERLIPIDEMIA: Stable  GOUT: Stable  HYPERTENSION: Slightly elevated. Not taking her morning meds. Restart.  CVA: Stable INR therapeutic  GERD: Stable continue PPI  CKD (chronic kidney disease) stage 3, GFR 30-59 ml/min: Stable. Monitor creatinine with diuresis. Recheck in the morning  CAD (coronary artery disease): Stable COPD: Patient is formal diagnosis, although lung x-ray consistent with COPD. Will recommend for outpatient pulmonary function test.  Code Status: Full Code  Family Communication: Plan discussed with patient and wife was at the bedside  Disposition Plan: Given her comfortable he is breathing on room air, likely home tomorrow.  Time spent: 30 minutes  Hollice Espy [facility] Pager [phone]  If 7PM-7AM, please contact night-coverage [email or web address] Password [facility] 12/30/2012, 9:57 AM

## 2012-12-31 DIAGNOSIS — I509 Heart failure, unspecified: Secondary | ICD-10-CM

## 2012-12-31 DIAGNOSIS — I2510 Atherosclerotic heart disease of native coronary artery without angina pectoris: Secondary | ICD-10-CM

## 2012-12-31 DIAGNOSIS — J40 Bronchitis, not specified as acute or chronic: Secondary | ICD-10-CM | POA: Diagnosis not present

## 2012-12-31 DIAGNOSIS — I5043 Acute on chronic combined systolic (congestive) and diastolic (congestive) heart failure: Secondary | ICD-10-CM | POA: Diagnosis not present

## 2012-12-31 LAB — BASIC METABOLIC PANEL
CO2: 26 mEq/L (ref 19–32)
Chloride: 105 mEq/L (ref 96–112)
Creatinine, Ser: 1.56 mg/dL — ABNORMAL HIGH (ref 0.50–1.35)
GFR calc Af Amer: 49 mL/min — ABNORMAL LOW (ref >90)
Potassium: 3.1 mEq/L — ABNORMAL LOW (ref 3.5–5.1)
Sodium: 144 mEq/L (ref 135–145)

## 2012-12-31 LAB — GLUCOSE, CAPILLARY
Glucose-Capillary: 145 mg/dL — ABNORMAL HIGH (ref 70–99)
Glucose-Capillary: 111 mg/dL — ABNORMAL HIGH (ref 70–99)

## 2012-12-31 LAB — PROTIME-INR: Prothrombin Time: 26 seconds — ABNORMAL HIGH (ref 11.6–15.2)

## 2012-12-31 LAB — TROPONIN I: Troponin I: <0.3 ng/mL (ref <0.30)

## 2012-12-31 LAB — PRO B NATRIURETIC PEPTIDE: Pro B Natriuretic peptide (BNP): 1365 pg/mL — ABNORMAL HIGH (ref 0–125)

## 2012-12-31 MED ORDER — GLIPIZIDE 2.5 MG HALF TABLET: 2.5000 mg | ORAL_TABLET | Freq: Two times a day (BID) | ORAL | Status: DC

## 2012-12-31 MED ORDER — ALUM & MAG HYDROXIDE-SIMETH 200-200-20 MG/5ML PO SUSP
30.0000 mL | Freq: Once | ORAL | Status: AC
  Administered 2012-12-31: 30 mL via ORAL
  Filled 2012-12-31: qty 30

## 2012-12-31 MED ORDER — GLIPIZIDE 2.5 MG HALF TABLET
2.5000 mg | ORAL_TABLET | Freq: Two times a day (BID) | ORAL | Status: DC
  Administered 2012-12-31: 2.5 mg via ORAL
  Filled 2012-12-31 (×2): qty 1

## 2012-12-31 MED ORDER — ALUM & MAG HYDROXIDE-SIMETH 200-200-20 MG/5ML PO SUSP
30.0000 mL | Freq: Once | ORAL | Status: AC
  Administered 2012-12-31: 30 mL via ORAL

## 2012-12-31 MED ORDER — POTASSIUM CHLORIDE CRYS ER 20 MEQ PO TBCR
40.0000 meq | EXTENDED_RELEASE_TABLET | Freq: Once | ORAL | Status: AC
  Administered 2012-12-31: 40 meq via ORAL
  Filled 2012-12-31: qty 2

## 2012-12-31 MED ORDER — FUROSEMIDE 40 MG PO TABS: 40.0000 mg | ORAL_TABLET | Freq: Two times a day (BID) | ORAL | Status: DC

## 2012-12-31 NOTE — Progress Notes (Signed)
Pharmacist Heart Failure Core Measure Documentation  Assessment: Frank Mclean has an EF documented as 35-40%  on 01/03/12 by ECHO.  Rationale: Heart failure patients with left ventricular systolic dysfunction (LVSD) and an EF < 40% should be prescribed an angiotensin converting enzyme inhibitor (ACEI) or angiotensin receptor blocker (ARB) at discharge unless a contraindication is documented in the medical record.  This patient is not currently on an ACEI or ARB for HF.  This note is being placed in the record in order to provide documentation that a contraindication to the use of these agents is present for this encounter.  ACE Inhibitor or Angiotensin Receptor Blocker is contraindicated (specify all that apply)  [x]   ACEI allergy []   Angioedema []   Moderate or severe aortic stenosis []   Hyperkalemia []   Hypotension []   Renal artery stenosis []   Worsening renal function, preexisting renal disease or dysfunction    Doris Cheadle, PharmD Clinical Pharmacist Pager: [phone] Phone: [phone] 12/31/2012 2:39 PM

## 2012-12-31 NOTE — Discharge Summary (Addendum)
Physician Discharge Summary  Frank Mclean JXB:[id number] DOB: 06-Mar-1941 DOA: 12/30/2012  PCP: [name], MD  Admit date: 12/30/2012 Discharge date: 12/31/2012  Time spent: 30 minutes  Recommendations for Outpatient Follow-up:  1. Patient is being discharged to home, he will follow up with his primary care physician in the next one month 2. His appointment to followup with cardiology on 1/31, at that point his echocardiogram will be followed on 3. Patient's Lasix dose is being increased from 40 mg daily to twice a day  Discharge Diagnoses:  Principal Problem:  *Acute on chronic combined systolic and diastolic congestive heart failure Active Problems:  DM (diabetes mellitus), type 2 with renal complications  HYPERLIPIDEMIA  GOUT  HYPERTENSION  CVA  GERD  CKD (chronic kidney disease) stage 3, GFR 30-59 ml/min  CAD (coronary artery disease)   Discharge Condition: Improved, being discharged home  Diet recommendation: Low sodium heart healthy  Filed Weights   12/30/12 0535 12/31/12 0544  Weight: 101.833 kg (224 lb 8 oz) 99.2 kg (218 lb 11.1 oz)    History of present illness:  Frank Mclean is a 72 y.o. 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 He had been to his primary care physician's office the day prior and was being treated for bronchitis for possible COPD. When his symptoms persisted, came into the emergency room. Seen on x-ray with interstitial edema. Patient had a BNP checked and found to be moderately elevated around 900. Started on IV Lasix and responded well to diuresis  Hospital Course:  Principal Problem:  *Acute on chronic combined systolic and diastolic congestive heart failure: Patient was told in the past that he does have heart failure, although he's not discussed this with his doctors in  some time. Received a packet of information about management of heart failure. He was put on aggressive diuresis and is down 2 L but is also 6 pounds lighter than when he first came in. He is breathing very comfortably and ambulating on room air. His BNP on admission was 864 and she went up slightly to 1365, although clinically he is better. He also has some underlying renal dysfunction and likely underlying COPD which may be affecting this number as well. See my cardiology who recommended some additional diuresis and patient is getting a dose of IV Lasix prior to discharge. In addition, an echocardiogram has been ordered and is pending. This will be completed prior to discharge. Patient will follow up with cardiology on 1/31. An echocardiogram report which was scanned in done on April 2012 noted an EF of 35% with hint of diastolic dysfunction.  Active Problems:  DM (diabetes mellitus), type 2 with renal complications: Stable. Metformin discontinued because of CHF. Started on low-dose glipizide.  HYPERLIPIDEMIA: Stable  GOUT: Stable no flareup  HYPERTENSION: Stable. Pressure well-controlled patient's aggressive medication.  CVA-history of: Stable medically issue   GERD: Stable. Continue PPI  CKD (chronic kidney disease) stage 3, GFR 30-59 ml/min: Creatinine stayed steady around 1.5- 1.6.   CAD (coronary artery disease): Stable during this hospitalization  COPD: Patient has does not have a formal diagnosis of COPD. This was seen on previous x-rays. He is minimal history of smoking, but likely was exposed to secondhand smoke during his lifetime. I recommended to his wife that he get set up for outpatient pulmonary function tests for formal diagnosis.  Procedures:  2-D echo: Pending  Consultations:  [facility]  Discharge Exam: Filed Vitals:   12/31/12 0240 12/31/12 0544 12/31/12 0830 12/31/12 0958  BP: 148/92 143/99 114/61 112/61  Pulse: 76 66 53 62  Temp: 98.2 F (36.8 C) 97.9  F (36.6 C) 97.5 F (36.4 C)   TempSrc: Oral Oral Oral   Resp: 18 18 17    Height:      Weight:  99.2 kg (218 lb 11.1 oz)    SpO2: 97% 96% 99%     General: Alert and oriented x3, no acute distress Cardiovascular: Regular rate and rhythm, S1-S2 Respiratory: Clear to auscultation bilaterally Abdomen: Soft, nontender, nondistended, positive bowel sounds Extremities: No clubbing or cyanosis, trace if any pitting edema  Discharge Instructions  Discharge Orders    Future Appointments: Provider: Department: Dept Phone: Center:   01/16/2013 1:15 PM [facility] [phone] None   01/19/2013 8:45 AM Rollene Rotunda, MD [facility] Main Office [place]) [phone] [id number]     Future Orders Please Complete By Expires   Diet - low sodium heart healthy      Increase activity slowly          Medication List     As of 12/31/2012 10:58 AM    STOP taking these medications         amoxicillin 500 MG capsule   Commonly known as: AMOXIL      metFORMIN 500 MG 24 hr tablet   Commonly known as: GLUCOPHAGE-XR      TAKE these medications         allopurinol 300 MG tablet   Commonly known as: ZYLOPRIM   Take 300 mg by mouth daily.      amLODipine 10 MG tablet   Commonly known as: NORVASC   Take 1 tablet (10 mg total) by mouth daily.      atorvastatin 20 MG tablet   Commonly known as: LIPITOR   TAKE 1 TABLET EVERY DAY FOR CHOLESTEROL      budesonide-formoterol 160-4.5 MCG/ACT inhaler   Commonly known as: SYMBICORT   Inhale 2 puffs into the lungs 2 (two) times daily.      Cholecalciferol 1000 UNITS tablet   Take 1,000 Units by mouth daily.      cloNIDine 0.2 MG tablet   Commonly known as: CATAPRES   Take 1 tablet (0.2 mg total) by mouth 3 (three) times daily.      clotrimazole-betamethasone cream   Commonly known as: LOTRISONE   Apply topically 2 (two) times daily as needed. Fungal infection      cyanocobalamin 1000 MCG  tablet   Take 100 mcg by mouth daily.      doxazosin 1 MG tablet   Commonly known as: CARDURA   TAKE 1 TABLET EVERY DAY      Fluticasone Furoate-Vilanterol 100-25 MCG/INH Aepb   Inhale 1 Act into the lungs daily.      furosemide 40 MG tablet   Commonly known as: LASIX   Take 1 tablet (40 mg total) by mouth 2 (two) times daily.      glipiZIDE 2.5 mg Tabs   Commonly known as: GLUCOTROL   Take 0.5 tablets (2.5 mg total) by mouth 2 (two) times daily before a meal.      hydrALAZINE 50 MG tablet   Commonly known as: APRESOLINE   TAKE 1 AND 1/2 TABLETS THREE TIMES A DAY      HYDROcodone-acetaminophen 5-500 MG per tablet  Commonly known as: VICODIN   Take 1 tablet by mouth 4 (four) times daily as needed.      isosorbide mononitrate 120 MG 24 hr tablet   Commonly known as: IMDUR   TAKE 1 TABLET (120 MG TOTAL) BY MOUTH EVERY MORNING.      KLOR-CON M20 20 MEQ tablet   Generic drug: potassium chloride SA   TAKE 1 TABLET BY MOUTH TWICE DAILY.      labetalol 200 MG tablet   Commonly known as: NORMODYNE   Take 200 mg by mouth 2 (two) times daily.      NITROSTAT 0.4 MG SL tablet   Generic drug: nitroGLYCERIN   Place 0.4 mg under the tongue every 5 (five) minutes as needed.      promethazine-codeine 6.25-10 MG/5ML syrup   Commonly known as: PHENERGAN with CODEINE   Take 5 mLs by mouth every 4 (four) hours as needed for cough.      triamcinolone cream 0.5 %   Commonly known as: KENALOG   APPLY TO AFFECTED AREA TWICE DAILY      warfarin 5 MG tablet   Commonly known as: COUMADIN   Take 5-7.5 mg by mouth daily. Takes 5 mg every other day, on the 'other day' take 7.5 mg          The results of significant diagnostics from this hospitalization (including imaging, microbiology, ancillary and laboratory) are listed below for reference.    Significant Diagnostic Studies: Dg Chest 2 View  12/29/2012    IMPRESSION: COPD.  Small pleural effusions without evidence of vascular  congestion.   Original Report Authenticated By: Janeece Riggers, M.D.    Dg Chest [facility]  12/30/2012  .  IMPRESSION: Increasing interstitial prominence, likely early interstitial edema.  COPD.   Original Report Authenticated By: Charlett Nose, M.D.      Labs: Basic Metabolic Panel:  Lab 12/31/12 4098 12/30/12 1234 12/30/12 0310 12/30/12 0258  NA 144 -- 146* 144  K 3.1* -- 3.3* 3.3*  CL 105 -- 108 106  CO2 26 -- -- 24  GLUCOSE 148* -- 143* 157*  BUN 18 -- 16 17  CREATININE 1.56* 1.41* 1.50* 1.50*  CALCIUM 9.2 -- -- 9.2  MG -- -- -- --  PHOS -- -- -- --   CBC:  Lab 12/30/12 1234 12/30/12 0310 12/30/12 0258  WBC 10.5 -- 9.0  NEUTROABS -- -- 7.1  HGB 12.4* 12.2* 11.6*  HCT 38.6* 36.0* 36.2*  MCV 79.4 -- 79.0  PLT 308 -- 285   Cardiac Enzymes:  Lab 12/31/12 0548 12/30/12 2358 12/30/12 1749 12/30/12 0841  CKTOTAL -- -- -- --  CKMB -- -- -- --  CKMBINDEX -- -- -- --  TROPONINI <0.30 <0.30 <0.30 <0.30   BNP: BNP (last 3 results)  Basename 12/31/12 0548 12/30/12 0259  PROBNP 1365.0* 863.8*   CBG:  Lab 12/31/12 0603 12/30/12 2119 12/30/12 1617 12/30/12 1059 12/30/12 0613  GLUCAP 141* 109* 146* 128* 133*       Signed:  Christe Tellez K  [facility] 12/31/2012, 10:58 AM

## 2012-12-31 NOTE — Progress Notes (Signed)
  Echocardiogram 2D Echocardiogram with Definity has been performed.  Maebel Marasco FRANCES 12/31/2012, 12:50 PM

## 2012-12-31 NOTE — Progress Notes (Signed)
Primary cardiologist: Dr. Rollene Rotunda  Subjective:   Patient up in chair, states that he feels much better. Slept well last night.   Objective:   Temp:  [97.5 F (36.4 C)-98.2 F (36.8 C)] 97.5 F (36.4 C) (01/12 0830) Pulse Rate:  [53-76] 62  (01/12 0958) Resp:  [16-18] 17  (01/12 0830) BP: (112-152)/(61-99) 112/61 mmHg (01/12 0958) SpO2:  [96 %-100 %] 99 % (01/12 0830) Weight:  [218 lb 11.1 oz (99.2 kg)] 218 lb 11.1 oz (99.2 kg) (01/12 0544) Last BM Date: 12/30/12  Filed Weights   12/30/12 0535 12/31/12 0544  Weight: 224 lb 8 oz (101.833 kg) 218 lb 11.1 oz (99.2 kg)    Intake/Output Summary (Last 24 hours) at 12/31/12 1010 Last data filed at 12/31/12 0936  Gross per 24 hour  Intake   1080 ml  Output   2250 ml  Net  -1170 ml   Telemetry: Sinus rhythm with PVCs  Exam:  General: NAD.  Lungs: Coarse but clear breath sounds.  Cardiac: RRR, indistinct PMI, no gallop.  Extremities: Venous stasis, trace edema.  Lab Results:  Basic Metabolic Panel:  Lab 12/31/12 1610 12/30/12 1234 12/30/12 0310 12/30/12 0258  NA 144 -- 146* 144  K 3.1* -- 3.3* 3.3*  CL 105 -- 108 106  CO2 26 -- -- 24  GLUCOSE 148* -- 143* 157*  BUN 18 -- 16 17  CREATININE 1.56* 1.41* 1.50* --  CALCIUM 9.2 -- -- 9.2  MG -- -- -- --    CBC:  Lab 12/30/12 1234 12/30/12 0310 12/30/12 0258  WBC 10.5 -- 9.0  HGB 12.4* 12.2* 11.6*  HCT 38.6* 36.0* 36.2*  MCV 79.4 -- 79.0  PLT 308 -- 285    Cardiac Enzymes:  Lab 12/31/12 0548 12/30/12 2358 12/30/12 1749  CKTOTAL -- -- --  CKMB -- -- --  CKMBINDEX -- -- --  TROPONINI <0.30 <0.30 <0.30    BNP:  Basename 12/31/12 0548 12/30/12 0259  PROBNP 1365.0* 863.8*    Coagulation:  Lab 12/31/12 0530 12/30/12 0258 12/26/12 1356  INR 2.52* 2.33* 1.7     Medications:   Scheduled Medications:    . allopurinol  300 mg Oral Daily  . amLODipine  10 mg Oral Daily  . atorvastatin  20 mg Oral q1800  . budesonide-formoterol  2 puff  Inhalation BID  . cholecalciferol  1,000 Units Oral Daily  . cloNIDine  0.2 mg Oral TID  . doxazosin  1 mg Oral Daily  . Fluticasone Furoate-Vilanterol  1 Act Inhalation Daily  . furosemide  40 mg Intravenous Daily  . glipiZIDE  2.5 mg Oral BID AC  . hydrALAZINE  75 mg Oral Q8H  . isosorbide mononitrate  120 mg Oral Daily  . labetalol  200 mg Oral BID  . potassium chloride SA  20 mEq Oral Daily  . sodium chloride  3 mL Intravenous Q12H  . vitamin B-12  1,000 mcg Oral Daily  . warfarin  7.5 mg Oral Q48H  . Warfarin - Physician Dosing Inpatient   Does not apply q1800      PRN Medications:  sodium chloride, acetaminophen, HYDROcodone-acetaminophen, ipratropium, levalbuterol, nitroGLYCERIN, ondansetron (ZOFRAN) IV, sodium chloride   Assessment:   1. Acute on chronic systolic heart failure.  2. Nonischemic cardiomyopathy, LVEF 35-40%.  3. Possible COPD. Question bronchitis with recent cough.  4. Previous history of left pontine stroke in 2004, on chronic Coumadin.  5. CKD, stage 2-3. Creatinine 1.4-1.5.  Plan/Discussion:  Would continue IV Lasix further today, followup echocardiogram to reassess LVEF. He needs to ambulate. Other medical regimen looks reasonable. He does need potassium repletion. Further management of diabetes and possible COPD/bronchitis as per primary team. Should be ready for discharge soon, and at that point will need followup in our [facility] office with Dr. Antoine Poche or PA within approximately one week. May need a somewhat higher baseline dose of Lasix at home, versus as necessary escalation of dose based on weight change.   Jonelle Sidle, M.D., F.A.C.C.

## 2012-12-31 NOTE — Progress Notes (Signed)
ANTICOAGULATION CONSULT NOTE - Follow Up Consult  Pharmacy Consult for warfarin Indication: atrial fibrillation  Allergies  Allergen Reactions  . Ace Inhibitors     REACTION: cough    Patient Measurements: Height: 6\' 2"  (188 cm) Weight: 218 lb 11.1 oz (99.2 kg) IBW/kg (Calculated) : 82.2   Vital Signs: Temp: 97.1 F (36.2 C) (01/12 1238) Temp src: Oral (01/12 1238) BP: 124/79 mmHg (01/12 1238) Pulse Rate: 53  (01/12 1238)  Labs:  Basename 12/31/12 0548 12/31/12 0530 12/30/12 2358 12/30/12 1749 12/30/12 1234 12/30/12 0310 12/30/12 0258  HGB -- -- -- -- 12.4* 12.2* --  HCT -- -- -- -- 38.6* 36.0* 36.2*  PLT -- -- -- -- 308 -- 285  APTT -- -- -- -- -- -- 63*  LABPROT -- 26.0* -- -- -- -- 24.5*  INR -- 2.52* -- -- -- -- 2.33*  HEPARINUNFRC -- -- -- -- -- -- --  CREATININE -- 1.56* -- -- 1.41* 1.50* --  CKTOTAL -- -- -- -- -- -- --  CKMB -- -- -- -- -- -- --  TROPONINI <0.30 -- <0.30 <0.30 -- -- --    Estimated Creatinine Clearance: 53.1 ml/min (by C-G formula based on Cr of 1.56).   Medications:  Prescriptions prior to admission  Medication Sig Dispense Refill  . allopurinol (ZYLOPRIM) 300 MG tablet Take 300 mg by mouth daily.        [name] amLODipine (NORVASC) 10 MG tablet Take 1 tablet (10 mg total) by mouth daily.  30 tablet  11  . atorvastatin (LIPITOR) 20 MG tablet TAKE 1 TABLET EVERY DAY FOR CHOLESTEROL  30 tablet  6  . budesonide-formoterol (SYMBICORT) 160-4.5 MCG/ACT inhaler Inhale 2 puffs into the lungs 2 (two) times daily.      . Cholecalciferol 1000 UNITS tablet Take 1,000 Units by mouth daily.        . cloNIDine (CATAPRES) 0.2 MG tablet Take 1 tablet (0.2 mg total) by mouth 3 (three) times daily.  90 tablet  8  . clotrimazole-betamethasone (LOTRISONE) cream Apply topically 2 (two) times daily as needed. Fungal infection      . cyanocobalamin 1000 MCG tablet Take 100 mcg by mouth daily.        [name] doxazosin (CARDURA) 1 MG tablet TAKE 1 TABLET EVERY DAY  30 tablet   2  . Fluticasone Furoate-Vilanterol (BREO ELLIPTA) 100-25 MCG/INH AEPB Inhale 1 Act into the lungs daily.  1 each  5  . hydrALAZINE (APRESOLINE) 50 MG tablet TAKE 1 AND 1/2 TABLETS THREE TIMES A DAY  135 tablet  8  . HYDROcodone-acetaminophen (VICODIN) 5-500 MG per tablet Take 1 tablet by mouth 4 (four) times daily as needed.  90 tablet  0  . isosorbide mononitrate (IMDUR) 120 MG 24 hr tablet TAKE 1 TABLET (120 MG TOTAL) BY MOUTH EVERY MORNING.  30 tablet  8  . KLOR-CON M20 20 MEQ tablet TAKE 1 TABLET BY MOUTH TWICE DAILY.  60 tablet  5  . labetalol (NORMODYNE) 200 MG tablet Take 200 mg by mouth 2 (two) times daily.        [name] NITROSTAT 0.4 MG SL tablet Place 0.4 mg under the tongue every 5 (five) minutes as needed.       . promethazine-codeine (PHENERGAN WITH CODEINE) 6.25-10 MG/5ML syrup Take 5 mLs by mouth every 4 (four) hours as needed for cough.  240 mL  0  . triamcinolone cream (KENALOG) 0.5 % APPLY TO AFFECTED AREA TWICE DAILY  270 g  0  . warfarin (COUMADIN) 5 MG tablet Take 5-7.5 mg by mouth daily. Takes 5 mg every other day, on the 'other day' take 7.5 mg      . [DISCONTINUED] amoxicillin (AMOXIL) 500 MG capsule Take 2 capsules (1,000 mg total) by mouth 2 (two) times daily.  40 capsule  0  . [DISCONTINUED] furosemide (LASIX) 40 MG tablet TAKE 1 TABLET EVERY DAY  30 tablet  5  . [DISCONTINUED] metFORMIN (GLUCOPHAGE-XR) 500 MG 24 hr tablet TAKE 1 TABLET (500 MG TOTAL) BY MOUTH DAILY WITH BREAKFAST.  90 tablet  1    Assessment: 71 y/oM on Coumadin PTA for afib. Home dose is alternating 5 mg and 7.5 mg daily. INR on admit 2.33, therapeutic.  No bleeding noted. INR remains therapeutic today, INR 2.52.    Goal of Therapy:  INR 2-3 Monitor platelets by anticoagulation protocol: Yes   Plan:  1. Warfarin 7.5 mg x1 tonight 2. Daily INR   Doris Cheadle, PharmD Clinical Pharmacist Pager: [phone] Phone: [phone] 12/31/2012 1:19 PM

## 2012-12-31 NOTE — Progress Notes (Signed)
Discharge instructions given to pt and wife . Verbalized understanding. Wheeled to lobby by tech

## 2013-01-01 MED FILL — Perflutren Lipid Microsphere IV Susp 1.1 MG/ML: INTRAVENOUS | Qty: 10 | Status: AC

## 2013-01-02 ENCOUNTER — Encounter: Payer: Self-pay | Admitting: Internal Medicine

## 2013-01-02 ENCOUNTER — Ambulatory Visit (INDEPENDENT_AMBULATORY_CARE_PROVIDER_SITE_OTHER): Payer: Medicare Other | Admitting: Internal Medicine

## 2013-01-02 VITALS — BP 140/90 | HR 80 | Temp 96.8°F | Resp 16 | Wt 229.0 lb

## 2013-01-02 DIAGNOSIS — IMO0001 Reserved for inherently not codable concepts without codable children: Secondary | ICD-10-CM

## 2013-01-02 DIAGNOSIS — E538 Deficiency of other specified B group vitamins: Secondary | ICD-10-CM | POA: Diagnosis not present

## 2013-01-02 DIAGNOSIS — I509 Heart failure, unspecified: Secondary | ICD-10-CM

## 2013-01-02 DIAGNOSIS — I5043 Acute on chronic combined systolic (congestive) and diastolic (congestive) heart failure: Secondary | ICD-10-CM

## 2013-01-02 DIAGNOSIS — I10 Essential (primary) hypertension: Secondary | ICD-10-CM | POA: Diagnosis not present

## 2013-01-02 DIAGNOSIS — J40 Bronchitis, not specified as acute or chronic: Secondary | ICD-10-CM

## 2013-01-02 DIAGNOSIS — J449 Chronic obstructive pulmonary disease, unspecified: Secondary | ICD-10-CM | POA: Diagnosis not present

## 2013-01-02 MED ORDER — FUROSEMIDE 40 MG PO TABS: 40.0000 mg | ORAL_TABLET | Freq: Every day | ORAL | Status: DC

## 2013-01-02 MED ORDER — CYANOCOBALAMIN 1000 MCG PO TABS: 1000.0000 ug | ORAL_TABLET | Freq: Every day | ORAL | Status: DC

## 2013-01-02 NOTE — Assessment & Plan Note (Signed)
Continue with current prescription therapy as reflected on the Med list.  

## 2013-01-02 NOTE — Assessment & Plan Note (Signed)
Better  

## 2013-01-02 NOTE — Telephone Encounter (Signed)
Left message for pt to callback office.  

## 2013-01-02 NOTE — Assessment & Plan Note (Signed)
Better now Breo to cont

## 2013-01-02 NOTE — Progress Notes (Signed)
Subjective:   HPI Post-hosp f/up: Discharge Diagnoses:  Principal Problem:  *Acute on chronic combined systolic and diastolic congestive heart failure  Active Problems:  DM (diabetes mellitus), type 2 with renal complications  HYPERLIPIDEMIA  GOUT  HYPERTENSION  CVA  GERD  CKD (chronic kidney disease) stage 3, GFR 30-59 ml/min  CAD (coronary artery disease)  Discharge Condition: Improved, being discharged home  Diet recommendation: Low sodium heart healthy  Filed Weights    12/30/12 0535  12/31/12 0544   Weight:  101.833 kg (224 lb 8 oz)  99.2 kg (218 lb 11.1 oz)   History of present illness:  Frank Mclean is a 72 y.o. 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 He had been to his primary care physician's office the day prior and was being treated for bronchitis for possible COPD. When his symptoms persisted, came into the emergency room. Seen on x-ray with interstitial edema. Patient had a BNP checked and found to be moderately elevated around 900. Started on IV Lasix and responded well to diuresis  Hospital Course:  Principal Problem:  *Acute on chronic combined systolic and diastolic congestive heart failure: Patient was told in the past that he does have heart failure, although he's not discussed this with his doctors in some time. Received a packet of information about management of heart failure. He was put on aggressive diuresis and is down 2 L but is also 6 pounds lighter than when he first came in. He is breathing very comfortably and ambulating on room air. His BNP on admission was 864 and she went up slightly to 1365, although clinically he is better. He also has some underlying renal dysfunction and likely underlying COPD which may be affecting this number as well. See my cardiology who recommended some  additional diuresis and patient is getting a dose of IV Lasix prior to discharge. In addition, an echocardiogram has been ordered and is pending. This will be completed prior to discharge. Patient will follow up with cardiology on 1/31. An echocardiogram report which was scanned in done on April 2012 noted an EF of 35% with hint of diastolic dysfunction.  Active Problems:  DM (diabetes mellitus), type 2 with renal complications: Stable. Metformin discontinued because of CHF. Started on low-dose glipizide.  HYPERLIPIDEMIA: Stable  GOUT: Stable no flareup  HYPERTENSION: Stable. Pressure well-controlled patient's aggressive medication.  CVA-history of: Stable medically issue  GERD: Stable. Continue PPI  CKD (chronic kidney disease) stage 3, GFR 30-59 ml/min: Creatinine stayed steady around 1.5- 1.6.  CAD (coronary artery disease): Stable during this hospitalization  COPD: Patient has does not have a formal diagnosis of COPD. This was seen on previous x-rays. He is minimal history of smoking, but likely was exposed to secondhand smoke during his lifetime. I recommended to his wife that he get set up for outpatient pulmonary function tests for formal diagnosis.  Procedures:  2-D echo: Pending Consultations:  [place] [facility]ology Discharge Exam:  Filed Vitals:    12/31/12 0240  12/31/12 0544  12/31/12 0830  12/31/12 0958   BP:  148/92  143/99  114/61  112/61   Pulse:  76  66  53  62   Temp:  98.2 F (36.8 C)  97.9 F (36.6 C)  97.5 F (36.4 C)    TempSrc:  Oral  Oral  Oral  Resp:  18  18  17     Height:       Weight:   99.2 kg (218 lb 11.1 oz)     SpO2:  97%  96%  99%    General: Alert and oriented x3, no acute distress  Cardiovascular: Regular rate and rhythm, S1-S2  Respiratory: Clear to auscultation bilaterally  Abdomen: Soft, nontender, nondistended, positive bowel sounds  Extremities: No clubbing or cyanosis, trace if any pitting edema  The patient presents for a follow-up of   chronic hypertension, chronic dyslipidemia, type 2 diabetes, rash - resolved, controlled with medicines    Review of Systems  Constitutional: Positive for fatigue. Negative for appetite change and unexpected weight change.  HENT: Negative for nosebleeds, congestion, sneezing, trouble swallowing and neck pain.   Eyes: Negative for itching and visual disturbance.  Cardiovascular: Negative for palpitations and leg swelling.  Gastrointestinal: Negative for nausea, diarrhea, blood in stool and abdominal distention.  Genitourinary: Negative for frequency and hematuria.  Musculoskeletal: Positive for arthralgias. Negative for back pain, joint swelling and gait problem.  Neurological: Negative for dizziness, tremors, speech difficulty and weakness.  Psychiatric/Behavioral: Negative for sleep disturbance, dysphoric mood and agitation. The patient is nervous/anxious.        Objective:   Physical Exam  Constitutional: He is oriented to person, place, and time. He appears well-developed.  HENT:  Mouth/Throat: Oropharynx is clear and moist.  Eyes: Conjunctivae normal are normal. Pupils are equal, round, and reactive to light.  Neck: Normal range of motion. No JVD present. No thyromegaly present.  Cardiovascular: Normal rate, regular rhythm, normal heart sounds and intact distal pulses.  Exam reveals no gallop and no friction rub.   No murmur heard. Pulmonary/Chest: Effort normal and breath sounds normal. No respiratory distress. He has no wheezes. He has no rales. He exhibits no tenderness.  Abdominal: Soft. Bowel sounds are normal. He exhibits no distension and no mass. There is no tenderness. There is no rebound and no guarding.  Musculoskeletal: Normal range of motion. He exhibits no edema and no tenderness.  Lymphadenopathy:    He has no cervical adenopathy.  Neurological: He is alert and oriented to person, place, and time. He has normal reflexes. No cranial nerve deficit. He exhibits normal  muscle tone. Coordination normal.  Skin: Skin is warm and dry. No rash noted.  Psychiatric: He has a normal mood and affect. His behavior is normal. Judgment and thought content normal.    Lab Results  Component Value Date   WBC 10.5 12/30/2012   HGB 12.4* 12/30/2012   HCT 38.6* 12/30/2012   PLT 308 12/30/2012   GLUCOSE 148* 12/31/2012   CHOL 188 11/18/2011   TRIG 117.0 11/18/2011   HDL 61.10 11/18/2011   LDLDIRECT 177.9 06/17/2010   LDLCALC 104* 11/18/2011   ALT 12 04/15/2011   AST 13 04/15/2011   NA 144 12/31/2012   K 3.1* 12/31/2012   CL 105 12/31/2012   CREATININE 1.56* 12/31/2012   BUN 18 12/31/2012   CO2 26 12/31/2012   TSH 2.32 11/18/2011   PSA 0.01* 11/18/2011   INR 2.52* 12/31/2012   HGBA1C 6.9* 11/18/2011         Assessment & Plan:

## 2013-01-03 ENCOUNTER — Telehealth: Payer: Self-pay | Admitting: *Deleted

## 2013-01-03 MED ORDER — TORSEMIDE 100 MG PO TABS: 100.0000 mg | ORAL_TABLET | Freq: Every day | ORAL | Status: DC

## 2013-01-03 NOTE — Telephone Encounter (Signed)
Pt's wife left detailed mess stating pt couldn't sleep last night because he was so short of breath. She states he is using his inhaler bid and last night his breathing was so labored he was wheezing. She wants to know what to do about this. She also states he is at home resting now.  Please advise.

## 2013-01-03 NOTE — Telephone Encounter (Signed)
Take Torsemide in place of Lasix I called - he is aware RTC in 1 wk To ER if bad Thx

## 2013-01-03 NOTE — Telephone Encounter (Signed)
Pt informed at OV yesterday with PCP.

## 2013-01-19 ENCOUNTER — Ambulatory Visit (INDEPENDENT_AMBULATORY_CARE_PROVIDER_SITE_OTHER): Payer: Medicare Other | Admitting: Cardiology

## 2013-01-19 ENCOUNTER — Encounter: Payer: Self-pay | Admitting: Cardiology

## 2013-01-19 ENCOUNTER — Other Ambulatory Visit: Payer: Self-pay | Admitting: *Deleted

## 2013-01-19 VITALS — BP 126/86 | HR 60 | Ht 77.5 in | Wt 224.0 lb

## 2013-01-19 DIAGNOSIS — I509 Heart failure, unspecified: Secondary | ICD-10-CM

## 2013-01-19 DIAGNOSIS — I2510 Atherosclerotic heart disease of native coronary artery without angina pectoris: Secondary | ICD-10-CM

## 2013-01-19 DIAGNOSIS — E876 Hypokalemia: Secondary | ICD-10-CM

## 2013-01-19 DIAGNOSIS — I447 Left bundle-branch block, unspecified: Secondary | ICD-10-CM

## 2013-01-19 DIAGNOSIS — I10 Essential (primary) hypertension: Secondary | ICD-10-CM

## 2013-01-19 DIAGNOSIS — I5043 Acute on chronic combined systolic (congestive) and diastolic (congestive) heart failure: Secondary | ICD-10-CM | POA: Diagnosis not present

## 2013-01-19 LAB — BASIC METABOLIC PANEL
BUN: 17 mg/dL (ref 6–23)
Chloride: 104 mEq/L (ref 96–112)
Creatinine, Ser: 1.8 mg/dL — ABNORMAL HIGH (ref 0.4–1.5)
GFR: 48.34 mL/min — ABNORMAL LOW (ref >60.00)
Potassium: 3.1 mEq/L — ABNORMAL LOW (ref 3.5–5.1)

## 2013-01-19 MED ORDER — TORSEMIDE 100 MG PO TABS: 50.0000 mg | ORAL_TABLET | Freq: Every day | ORAL | Status: DC

## 2013-01-19 NOTE — Progress Notes (Signed)
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 We have evaluated with cardiac cath previously. He was in the hospital earlier this month with volume overload. He had a slow and steady weight gain and required a couple of days of increased diuresis. His EF was somewhat lower than previous.  He was switched to 100 mg Torsemide.  He has been feeling better since.  The patient denies any new symptoms such as chest discomfort, neck or arm discomfort. There has been no new shortness of breath, PND or orthopnea. There have been no reported palpitations, presyncope or syncope.  Allergies  Allergen Reactions  . Ace Inhibitors     REACTION: cough    Current Outpatient Prescriptions  Medication Sig Dispense Refill  . amLODipine (NORVASC) 10 MG tablet Take 1 tablet (10 mg total) by mouth daily.  30 tablet  11  . atorvastatin (LIPITOR) 20 MG tablet TAKE 1 TABLET EVERY DAY FOR CHOLESTEROL  30 tablet  6  . budesonide-formoterol (SYMBICORT) 160-4.5 MCG/ACT inhaler Inhale 2 puffs into the lungs 2 (two) times daily.      . Cholecalciferol 1000 UNITS tablet Take 1,000 Units by mouth daily.        . cloNIDine (CATAPRES) 0.2 MG tablet Take 1 tablet (0.2 mg total) by mouth 3 (three) times daily.  90 tablet  8  . clotrimazole-betamethasone (LOTRISONE) cream Apply topically 2 (two) times daily as needed. Fungal infection      . cyanocobalamin 1000 MCG tablet Take 1 tablet (1,000 mcg total) by mouth daily.  100 tablet  3  . Fluticasone Furoate-Vilanterol (BREO ELLIPTA) 100-25 MCG/INH AEPB Inhale 1 Act into the lungs daily.  1 each  5  . glipiZIDE (GLUCOTROL) 2.5 mg TABS Take 0.5 tablets (2.5 mg total) by mouth 2 (two) times daily before a meal.  30 tablet  0  . hydrALAZINE (APRESOLINE) 50 MG tablet TAKE 1 AND 1/2 TABLETS THREE TIMES A DAY  135 tablet  8  . HYDROcodone-acetaminophen (VICODIN) 5-500 MG per tablet Take 1 tablet by mouth 4 (four) times daily as needed.  90 tablet  0  .  isosorbide mononitrate (IMDUR) 120 MG 24 hr tablet TAKE 1 TABLET (120 MG TOTAL) BY MOUTH EVERY MORNING.  30 tablet  8  . KLOR-CON M20 20 MEQ tablet TAKE 1 TABLET BY MOUTH TWICE DAILY.  60 tablet  5  . labetalol (NORMODYNE) 200 MG tablet Take 200 mg by mouth 2 (two) times daily.        [name] NITROSTAT 0.4 MG SL tablet Place 0.4 mg under the tongue every 5 (five) minutes as needed.       . promethazine-codeine (PHENERGAN WITH CODEINE) 6.25-10 MG/5ML syrup Take 5 mLs by mouth every 4 (four) hours as needed for cough.  240 mL  0  . torsemide (DEMADEX) 100 MG tablet Take 1 tablet (100 mg total) by mouth daily.  30 tablet  3  . triamcinolone cream (KENALOG) 0.5 % APPLY TO AFFECTED AREA TWICE DAILY  270 g  0  . warfarin (COUMADIN) 5 MG tablet Take 5-7.5 mg by mouth daily. Takes 5 mg every other day, on the 'other day' take 7.5 mg        Past Medical History  Diagnosis Date  . Gout   . HTN (hypertension)   . Renal insufficiency   . Hyperlipidemia   . Osteoarthritis   . 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 GERD (gastroesophageal reflux disease)   . DJD (degenerative joint disease)   . CHF (congestive heart failure)     35 - 40% Echo 4/12  . BPH (benign prostatic hypertrophy)   . Type II or unspecified type diabetes mellitus without mention of complication, not stated as uncontrolled   . CAD (coronary artery disease)     Non obstructive  . Stroke     Past Surgical History  Procedure Date  . None     ROS:  As stated in the HPI and negative for all other systems.  PHYSICAL EXAM BP 126/86  Pulse 60  Ht 6' 5.5" (1.969 m)  Wt 224 lb (101.606 kg)  BMI 26.22 kg/m2  SpO2 99% GENERAL:  Well appearing NECK:  No jugular venous distention, waveform within normal limits, carotid upstroke brisk and symmetric, no bruits, no thyromegaly LYMPHATICS:  No cervical, inguinal adenopathy LUNGS:  Clear to  auscultation bilaterally BACK:  No CVA tenderness CHEST:  Unremarkable HEART:  PMI not displaced or sustained,S1 and S2 within normal limits, no S3, no S4, no clicks, no rubs, no murmurs ABD:  Flat, positive bowel sounds normal in frequency in pitch, no bruits, no rebound, no guarding, no midline pulsatile mass, no hepatomegaly, no splenomegaly EXT:  2 plus pulses throughout, trace edema, no cyanosis no clubbing SKIN:  No rashes no nodules, slight wound right anterior tibial NEURO:  Cranial nerves II through XII grossly intact, motor grossly intact throughout PSYCH:  Cognitively intact, oriented to person place and time  ASSESSMENT AND PLAN  CONGESTIVE HEART FAILURE -  He seems to be euvolemic.  He is on high-dose torsemide and I will reduce this to 50 mg once daily. We also talked about when necessary dosing using the Lasix that he has at home. He has always watch his salt but he is now weighing himself which should help.  His ejection fraction seem to be somewhat lower than I will follow this with repeat ejection fraction is in the future. I will check a basic metabolic profile today.  I reviewed all of the hospital records.   HYPERTENSION -  This is being managed in the context of treating his CHF  Encounter for long-term (current) use of anticoagulants - He has been on long-term anticoagulation since a stroke. I reviewed this. I think this is reasonable to continue. He has no problems taking warfarin.

## 2013-01-19 NOTE — Patient Instructions (Addendum)
Please decrease Torsemide to 50 mg a day  (1/2 tablet) You may take Furosemide 20 mg as needed for weight gain Continue all other medications as listed.  Please have blood work today (BMP)  Follow up in 3 months with Dr Antoine Poche

## 2013-01-23 ENCOUNTER — Other Ambulatory Visit (INDEPENDENT_AMBULATORY_CARE_PROVIDER_SITE_OTHER): Payer: Medicare Other

## 2013-01-23 ENCOUNTER — Other Ambulatory Visit: Payer: Medicare Other

## 2013-01-23 DIAGNOSIS — E876 Hypokalemia: Secondary | ICD-10-CM | POA: Diagnosis not present

## 2013-01-23 LAB — BASIC METABOLIC PANEL
Calcium: 9.4 mg/dL (ref 8.4–10.5)
GFR: 50.29 mL/min — ABNORMAL LOW (ref >60.00)
Potassium: 3.6 mEq/L (ref 3.5–5.1)
Sodium: 143 mEq/L (ref 135–145)

## 2013-02-07 ENCOUNTER — Other Ambulatory Visit: Payer: Self-pay | Admitting: Internal Medicine

## 2013-03-07 ENCOUNTER — Ambulatory Visit (INDEPENDENT_AMBULATORY_CARE_PROVIDER_SITE_OTHER): Payer: Medicare Other | Admitting: *Deleted

## 2013-03-07 DIAGNOSIS — Z7901 Long term (current) use of anticoagulants: Secondary | ICD-10-CM | POA: Diagnosis not present

## 2013-03-07 DIAGNOSIS — I6350 Cerebral infarction due to unspecified occlusion or stenosis of unspecified cerebral artery: Secondary | ICD-10-CM | POA: Diagnosis not present

## 2013-03-07 LAB — POCT INR: INR: 2.4

## 2013-03-15 ENCOUNTER — Other Ambulatory Visit: Payer: Self-pay | Admitting: Internal Medicine

## 2013-03-15 ENCOUNTER — Encounter: Payer: Self-pay | Admitting: Internal Medicine

## 2013-03-15 ENCOUNTER — Ambulatory Visit (INDEPENDENT_AMBULATORY_CARE_PROVIDER_SITE_OTHER): Payer: Medicare Other | Admitting: Internal Medicine

## 2013-03-15 VITALS — BP 140/92 | HR 76 | Temp 97.6°F | Resp 16 | Wt 222.0 lb

## 2013-03-15 DIAGNOSIS — I10 Essential (primary) hypertension: Secondary | ICD-10-CM

## 2013-03-15 DIAGNOSIS — M25579 Pain in unspecified ankle and joints of unspecified foot: Secondary | ICD-10-CM

## 2013-03-15 DIAGNOSIS — M25572 Pain in left ankle and joints of left foot: Secondary | ICD-10-CM

## 2013-03-15 DIAGNOSIS — I872 Venous insufficiency (chronic) (peripheral): Secondary | ICD-10-CM | POA: Diagnosis not present

## 2013-03-15 DIAGNOSIS — S81009A Unspecified open wound, unspecified knee, initial encounter: Secondary | ICD-10-CM | POA: Diagnosis not present

## 2013-03-15 DIAGNOSIS — S81809A Unspecified open wound, unspecified lower leg, initial encounter: Secondary | ICD-10-CM | POA: Diagnosis not present

## 2013-03-15 DIAGNOSIS — E538 Deficiency of other specified B group vitamins: Secondary | ICD-10-CM | POA: Diagnosis not present

## 2013-03-15 DIAGNOSIS — I509 Heart failure, unspecified: Secondary | ICD-10-CM

## 2013-03-15 DIAGNOSIS — I5043 Acute on chronic combined systolic (congestive) and diastolic (congestive) heart failure: Secondary | ICD-10-CM

## 2013-03-15 MED ORDER — DOXYCYCLINE HYCLATE 100 MG PO TABS: 100.0000 mg | ORAL_TABLET | Freq: Two times a day (BID) | ORAL | Status: DC

## 2013-03-15 MED ORDER — MUPIROCIN 2 % EX OINT: TOPICAL_OINTMENT | CUTANEOUS | Status: DC

## 2013-03-15 NOTE — Assessment & Plan Note (Signed)
Compr socks  Elevate legs

## 2013-03-15 NOTE — Assessment & Plan Note (Signed)
Wound dressed Compr socks  Elevate legs ACE

## 2013-03-15 NOTE — Assessment & Plan Note (Signed)
Continue with current prescription therapy as reflected on the Med list.  

## 2013-03-15 NOTE — Progress Notes (Signed)
   Subjective:    HPI  C/o leg wounds - a crate fell on his legs a week ago; painful    Review of Systems  Constitutional: Positive for fatigue. Negative for appetite change and unexpected weight change.  HENT: Negative for nosebleeds, congestion, sneezing, trouble swallowing and neck pain.   Eyes: Negative for itching and visual disturbance.  Cardiovascular: Positive for leg swelling. Negative for palpitations.  Gastrointestinal: Negative for nausea, diarrhea, blood in stool and abdominal distention.  Genitourinary: Negative for frequency and hematuria.  Musculoskeletal: Positive for arthralgias. Negative for back pain, joint swelling and gait problem.  Neurological: Negative for dizziness, tremors, speech difficulty and weakness.  Psychiatric/Behavioral: Negative for sleep disturbance, dysphoric mood and agitation. The patient is nervous/anxious.        Objective:   Physical Exam  Constitutional: He is oriented to person, place, and time. He appears well-developed.  HENT:  Mouth/Throat: Oropharynx is clear and moist.  Eyes: Conjunctivae are normal. Pupils are equal, round, and reactive to light.  Neck: Normal range of motion. No JVD present. No thyromegaly present.  Cardiovascular: Normal rate, regular rhythm, normal heart sounds and intact distal pulses.  Exam reveals no gallop and no friction rub.   No murmur heard. Pulmonary/Chest: Effort normal and breath sounds normal. No respiratory distress. He has no wheezes. He has no rales. He exhibits no tenderness.  Abdominal: Soft. Bowel sounds are normal. He exhibits no distension and no mass. There is no tenderness. There is no rebound and no guarding.  Musculoskeletal: Normal range of motion. He exhibits edema (trace B). He exhibits no tenderness.  Lymphadenopathy:    He has no cervical adenopathy.  Neurological: He is alert and oriented to person, place, and time. He has normal reflexes. No cranial nerve deficit. He exhibits  normal muscle tone. Coordination normal.  Skin: Skin is warm and dry. No rash noted.  2 scabbed abrasions on L lat ankle - 2x2.5 cm and 1x1 cm. 1 scabbed abrasion on R lat ankle 1x1 cm  Hyperpigmented ankles  Psychiatric: He has a normal mood and affect. His behavior is normal. Judgment and thought content normal.    Lab Results  Component Value Date   WBC 10.5 12/30/2012   HGB 12.4* 12/30/2012   HCT 38.6* 12/30/2012   PLT 308 12/30/2012   GLUCOSE 131* 01/23/2013   CHOL 188 11/18/2011   TRIG 117.0 11/18/2011   HDL 61.10 11/18/2011   LDLDIRECT 177.9 06/17/2010   LDLCALC 104* 11/18/2011   ALT 12 04/15/2011   AST 13 04/15/2011   NA 143 01/23/2013   K 3.6 01/23/2013   CL 105 01/23/2013   CREATININE 1.7* 01/23/2013   BUN 19 01/23/2013   CO2 29 01/23/2013   TSH 2.32 11/18/2011   PSA 0.01* 11/18/2011   INR 2.4 03/07/2013   HGBA1C 6.9* 11/18/2011          Assessment & Plan:

## 2013-03-15 NOTE — Assessment & Plan Note (Signed)
Compr socks  Elevate legs 

## 2013-03-15 NOTE — Patient Instructions (Addendum)
Elevate legs 

## 2013-03-19 ENCOUNTER — Telehealth: Payer: Self-pay

## 2013-03-19 NOTE — Telephone Encounter (Signed)
Ok - same sig and # Thx

## 2013-03-19 NOTE — Telephone Encounter (Signed)
Pt's spouse called stating that she was informed by pharmacy that they no longer make Hydrocodone 05/500. Pharmacy is requesting new Rx for strength 05/325. Spouse states that she and her spouse will be traveling out of town this Wednesday and they are requesting dosage change as soon as possible.

## 2013-03-20 MED ORDER — HYDROCODONE-ACETAMINOPHEN 5-325 MG PO TABS: 1.0000 | ORAL_TABLET | ORAL | Status: DC | PRN

## 2013-03-20 NOTE — Telephone Encounter (Signed)
5/500 is n/a

## 2013-03-20 NOTE — Telephone Encounter (Signed)
Rx called into pharmacy, spouse advised of same.

## 2013-03-28 ENCOUNTER — Ambulatory Visit (INDEPENDENT_AMBULATORY_CARE_PROVIDER_SITE_OTHER): Payer: Medicare Other | Admitting: *Deleted

## 2013-03-28 DIAGNOSIS — Z7901 Long term (current) use of anticoagulants: Secondary | ICD-10-CM | POA: Diagnosis not present

## 2013-03-28 DIAGNOSIS — I6350 Cerebral infarction due to unspecified occlusion or stenosis of unspecified cerebral artery: Secondary | ICD-10-CM | POA: Diagnosis not present

## 2013-03-28 LAB — POCT INR: INR: 3.1

## 2013-04-16 ENCOUNTER — Ambulatory Visit (INDEPENDENT_AMBULATORY_CARE_PROVIDER_SITE_OTHER): Payer: Medicare Other | Admitting: Internal Medicine

## 2013-04-16 ENCOUNTER — Encounter: Payer: Self-pay | Admitting: Internal Medicine

## 2013-04-16 VITALS — BP 130/86 | HR 62 | Temp 97.1°F | Wt 225.4 lb

## 2013-04-16 DIAGNOSIS — E785 Hyperlipidemia, unspecified: Secondary | ICD-10-CM

## 2013-04-16 DIAGNOSIS — L97909 Non-pressure chronic ulcer of unspecified part of unspecified lower leg with unspecified severity: Secondary | ICD-10-CM | POA: Diagnosis not present

## 2013-04-16 DIAGNOSIS — E1129 Type 2 diabetes mellitus with other diabetic kidney complication: Secondary | ICD-10-CM | POA: Diagnosis not present

## 2013-04-16 DIAGNOSIS — N058 Unspecified nephritic syndrome with other morphologic changes: Secondary | ICD-10-CM | POA: Diagnosis not present

## 2013-04-16 DIAGNOSIS — L97911 Non-pressure chronic ulcer of unspecified part of right lower leg limited to breakdown of skin: Secondary | ICD-10-CM

## 2013-04-16 DIAGNOSIS — L97929 Non-pressure chronic ulcer of unspecified part of left lower leg with unspecified severity: Secondary | ICD-10-CM

## 2013-04-16 MED ORDER — MUPIROCIN 2 % EX OINT: TOPICAL_OINTMENT | CUTANEOUS | Status: DC

## 2013-04-16 NOTE — Assessment & Plan Note (Signed)
Lab Results  Component Value Date   HGBA1C 6.9* 11/18/2011   Check a1c and adjust tx as needed

## 2013-04-16 NOTE — Assessment & Plan Note (Signed)
On statin - check lipids and adjust as needed

## 2013-04-16 NOTE — Progress Notes (Signed)
  Subjective:    Patient ID: Frank Mclean, male    DOB: 29-Jan-1941, 72 y.o.   MRN: [id number]  HPI  Complains of recurrent skin wounds bilateral ankles Evaluation one month ago by PCP for same - office visit reviewed  Past Medical History  Diagnosis Date  . Gout   . HTN (hypertension)   . Renal insufficiency   . Hyperlipidemia   . Osteoarthritis   . 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 GERD (gastroesophageal reflux disease)   . DJD (degenerative joint disease)   . CHF (congestive heart failure)     35 - 40% Echo 4/12  . BPH (benign prostatic hypertrophy)   . Type II or unspecified type diabetes mellitus without mention of complication, not stated as uncontrolled   . CAD (coronary artery disease)     Non obstructive  . Stroke     Review of Systems  Respiratory: Negative for cough and shortness of breath.   Cardiovascular: Negative for chest pain and palpitations.  Skin: Positive for wound. Negative for color change and rash.       Objective:   Physical Exam BP 130/86  Pulse 62  Temp(Src) 97.1 F (36.2 C) (Oral)  Wt 225 lb 6.4 oz (102.241 kg)  BMI 26.37 kg/m2  SpO2 99% Wt Readings from Last 3 Encounters:  04/16/13 225 lb 6.4 oz (102.241 kg)  03/15/13 222 lb (100.699 kg)  01/19/13 224 lb (101.606 kg)   Constitutional: he appears well-developed and well-nourished. No distress.  wife at side Neck: Normal range of motion. Neck supple. No JVD present. No thyromegaly present.  Cardiovascular: Normal rate, regular rhythm and normal heart sounds.  No murmur heard. trace BLE edema. Pulmonary/Chest: Effort normal and breath sounds normal. No respiratory distress. he has no wheezes. Skin: left medial ankle with 4 cm x 2 cm scabbed ulceration, no drainage, odor, purulence or surrounding cellulitis. Right lateral ankle with 2 x 2 centimeter scabbed ulceration without evidence of  infection. Remaining skin is warm and dry. No rash noted. No erythema.  Psychiatric: he has a normal mood and affect. behavior is normal. Judgment and thought content normal.  Lab Results  Component Value Date   WBC 10.5 12/30/2012   HGB 12.4* 12/30/2012   HCT 38.6* 12/30/2012   PLT 308 12/30/2012   GLUCOSE 131* 01/23/2013   CHOL 188 11/18/2011   TRIG 117.0 11/18/2011   HDL 61.10 11/18/2011   LDLDIRECT 177.9 06/17/2010   LDLCALC 104* 11/18/2011   ALT 12 04/15/2011   AST 13 04/15/2011   NA 143 01/23/2013   K 3.6 01/23/2013   CL 105 01/23/2013   CREATININE 1.7* 01/23/2013   BUN 19 01/23/2013   CO2 29 01/23/2013   TSH 2.32 11/18/2011   PSA 0.01* 11/18/2011   INR 3.1 03/28/2013   HGBA1C 6.9* 11/18/2011        Assessment & Plan:   BLE ankle wounds - present >45months - eval 03/15/13 OV for same reviewed No evidence for infection but slow healing Continue Bactroban ointment twice daily Refer to wound care center for evaluation of same to advise on treatment if needed Also arrange bilateral LE ABI to evaluate circulation

## 2013-04-16 NOTE — Patient Instructions (Signed)
It was good to see you today. We have reviewed your prior records including labs and tests today Medications reviewed and updated, no changes recommended at this time. Refill on medication(s) as discussed today. we'll make referral to Wound care clinic for your ankle wounds. Also for circulation testing of your legs as discussed (ABIs). Our office will contact you regarding appointment(s) once made. Continue using Bactroban ointment twice daily to your wound and keep covered as discussed. No other antibiotics indicated at this time

## 2013-04-17 ENCOUNTER — Other Ambulatory Visit (INDEPENDENT_AMBULATORY_CARE_PROVIDER_SITE_OTHER): Payer: Medicare Other

## 2013-04-17 ENCOUNTER — Other Ambulatory Visit: Payer: Self-pay | Admitting: Internal Medicine

## 2013-04-17 DIAGNOSIS — N058 Unspecified nephritic syndrome with other morphologic changes: Secondary | ICD-10-CM | POA: Diagnosis not present

## 2013-04-17 DIAGNOSIS — E785 Hyperlipidemia, unspecified: Secondary | ICD-10-CM

## 2013-04-17 DIAGNOSIS — E1129 Type 2 diabetes mellitus with other diabetic kidney complication: Secondary | ICD-10-CM | POA: Diagnosis not present

## 2013-04-17 LAB — LIPID PANEL
HDL: 42.2 mg/dL (ref >39.00)
Total CHOL/HDL Ratio: 3
Triglycerides: 103 mg/dL (ref 0.0–149.0)

## 2013-04-17 LAB — HEMOGLOBIN A1C: Hgb A1c MFr Bld: 6.9 % — ABNORMAL HIGH (ref 4.6–6.5)

## 2013-04-19 ENCOUNTER — Encounter (INDEPENDENT_AMBULATORY_CARE_PROVIDER_SITE_OTHER): Payer: Medicare Other

## 2013-04-19 ENCOUNTER — Ambulatory Visit (INDEPENDENT_AMBULATORY_CARE_PROVIDER_SITE_OTHER): Payer: Medicare Other | Admitting: *Deleted

## 2013-04-19 DIAGNOSIS — I70219 Atherosclerosis of native arteries of extremities with intermittent claudication, unspecified extremity: Secondary | ICD-10-CM

## 2013-04-19 DIAGNOSIS — Z7901 Long term (current) use of anticoagulants: Secondary | ICD-10-CM | POA: Diagnosis not present

## 2013-04-19 DIAGNOSIS — I6350 Cerebral infarction due to unspecified occlusion or stenosis of unspecified cerebral artery: Secondary | ICD-10-CM | POA: Diagnosis not present

## 2013-04-19 DIAGNOSIS — L97929 Non-pressure chronic ulcer of unspecified part of left lower leg with unspecified severity: Secondary | ICD-10-CM

## 2013-04-19 DIAGNOSIS — L97911 Non-pressure chronic ulcer of unspecified part of right lower leg limited to breakdown of skin: Secondary | ICD-10-CM

## 2013-04-19 DIAGNOSIS — E1129 Type 2 diabetes mellitus with other diabetic kidney complication: Secondary | ICD-10-CM

## 2013-04-19 DIAGNOSIS — E785 Hyperlipidemia, unspecified: Secondary | ICD-10-CM

## 2013-04-19 LAB — POCT INR: INR: 5.8

## 2013-04-23 ENCOUNTER — Encounter: Payer: Self-pay | Admitting: Internal Medicine

## 2013-04-23 ENCOUNTER — Other Ambulatory Visit: Payer: Self-pay | Admitting: Internal Medicine

## 2013-04-23 ENCOUNTER — Ambulatory Visit (INDEPENDENT_AMBULATORY_CARE_PROVIDER_SITE_OTHER): Payer: Medicare Other

## 2013-04-23 DIAGNOSIS — I70219 Atherosclerosis of native arteries of extremities with intermittent claudication, unspecified extremity: Secondary | ICD-10-CM

## 2013-04-23 DIAGNOSIS — Z7901 Long term (current) use of anticoagulants: Secondary | ICD-10-CM

## 2013-04-23 DIAGNOSIS — I6350 Cerebral infarction due to unspecified occlusion or stenosis of unspecified cerebral artery: Secondary | ICD-10-CM | POA: Diagnosis not present

## 2013-04-23 DIAGNOSIS — I10 Essential (primary) hypertension: Secondary | ICD-10-CM

## 2013-04-23 DIAGNOSIS — E1129 Type 2 diabetes mellitus with other diabetic kidney complication: Secondary | ICD-10-CM

## 2013-04-23 DIAGNOSIS — E785 Hyperlipidemia, unspecified: Secondary | ICD-10-CM

## 2013-04-24 ENCOUNTER — Encounter: Payer: Self-pay | Admitting: Cardiology

## 2013-04-24 ENCOUNTER — Telehealth: Payer: Self-pay | Admitting: *Deleted

## 2013-04-24 ENCOUNTER — Ambulatory Visit (INDEPENDENT_AMBULATORY_CARE_PROVIDER_SITE_OTHER): Payer: Medicare Other | Admitting: Cardiology

## 2013-04-24 VITALS — BP 155/97 | HR 81 | Ht 74.0 in | Wt 230.4 lb

## 2013-04-24 DIAGNOSIS — I5043 Acute on chronic combined systolic (congestive) and diastolic (congestive) heart failure: Secondary | ICD-10-CM

## 2013-04-24 DIAGNOSIS — I509 Heart failure, unspecified: Secondary | ICD-10-CM | POA: Diagnosis not present

## 2013-04-24 DIAGNOSIS — R0602 Shortness of breath: Secondary | ICD-10-CM

## 2013-04-24 DIAGNOSIS — J40 Bronchitis, not specified as acute or chronic: Secondary | ICD-10-CM

## 2013-04-24 DIAGNOSIS — I10 Essential (primary) hypertension: Secondary | ICD-10-CM | POA: Diagnosis not present

## 2013-04-24 DIAGNOSIS — J449 Chronic obstructive pulmonary disease, unspecified: Secondary | ICD-10-CM

## 2013-04-24 DIAGNOSIS — I739 Peripheral vascular disease, unspecified: Secondary | ICD-10-CM | POA: Diagnosis not present

## 2013-04-24 DIAGNOSIS — IMO0001 Reserved for inherently not codable concepts without codable children: Secondary | ICD-10-CM

## 2013-04-24 DIAGNOSIS — E538 Deficiency of other specified B group vitamins: Secondary | ICD-10-CM | POA: Diagnosis not present

## 2013-04-24 LAB — BASIC METABOLIC PANEL
Calcium: 8.9 mg/dL (ref 8.4–10.5)
GFR: 57.96 mL/min — ABNORMAL LOW (ref >60.00)
Potassium: 2.8 mEq/L — CL (ref 3.5–5.1)
Sodium: 139 mEq/L (ref 135–145)

## 2013-04-24 LAB — TSH: TSH: 2.66 u[IU]/mL (ref 0.35–5.50)

## 2013-04-24 MED ORDER — LABETALOL HCL 200 MG PO TABS: 200.0000 mg | ORAL_TABLET | Freq: Three times a day (TID) | ORAL | Status: DC

## 2013-04-24 NOTE — Telephone Encounter (Signed)
Pt's Kcl is critical at 2.8. I called pt to see if he has been taking Klor Con 20 meq 1 po bid as stated on med list. No answer. Left mess for patient to call back.

## 2013-04-24 NOTE — Progress Notes (Signed)
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 We have evaluated with cardiac cath previously. He was in the hospital earlier this year with volume overload. His EF was somewhat lower than previous.  The patient denies any new symptoms such as chest discomfort, neck or arm discomfort. There has been no new shortness of breath, PND or orthopnea. There have been no reported palpitations, presyncope or syncope.  However, there has been some wheezing. He has gained 6 pounds since I last saw him. He actually does have some increased left greater than right lower extremity swelling but he had trauma to his left foot. He has wounds on both lower extremities and is actually these by the wound clinic. 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 He does get some leg pain but this is atypical for claudication. He thinks it's deep in the bone.   No Active Allergies  Current Outpatient Prescriptions  Medication Sig Dispense Refill  . amLODipine (NORVASC) 10 MG tablet Take 1 tablet (10 mg total) by mouth daily.  30 tablet  11  . atorvastatin (LIPITOR) 20 MG tablet TAKE 1 TABLET EVERY DAY FOR CHOLESTEROL  30 tablet  6  . budesonide-formoterol (SYMBICORT) 160-4.5 MCG/ACT inhaler Inhale 2 puffs into the lungs 2 (two) times daily.      . Cholecalciferol 1000 UNITS tablet Take 1,000 Units by mouth daily.        . cloNIDine (CATAPRES) 0.2 MG tablet Take 1 tablet (0.2 mg total) by mouth 3 (three) times daily.  90 tablet  8  . clotrimazole-betamethasone (LOTRISONE) cream Apply topically 2 (two) times daily as needed. Fungal infection      . cyanocobalamin 1000 MCG tablet Take 1 tablet (1,000 mcg total) by mouth daily.  100 tablet  3  . Fluticasone Furoate-Vilanterol (BREO ELLIPTA) 100-25 MCG/INH AEPB Inhale 1 Act into the lungs daily.  1 each  5  . glipiZIDE (GLUCOTROL) 5 MG tablet TAKE 1/2 TABLET BY MOUTH TWICE DAILY BEFORE MEALS  30 tablet  5   . hydrALAZINE (APRESOLINE) 50 MG tablet TAKE 1 AND 1/2 TABLETS THREE TIMES A DAY  135 tablet  8  . HYDROcodone-acetaminophen (NORCO/VICODIN) 5-325 MG per tablet Take 1 tablet by mouth every 4 (four) hours as needed for pain.  90 tablet  0  . isosorbide mononitrate (IMDUR) 120 MG 24 hr tablet TAKE 1 TABLET (120 MG TOTAL) BY MOUTH EVERY MORNING.  30 tablet  8  . KLOR-CON M20 20 MEQ tablet TAKE 1 TABLET BY MOUTH TWICE DAILY.  60 tablet  5  . labetalol (NORMODYNE) 200 MG tablet Take 200 mg by mouth 2 (two) times daily.        . mupirocin ointment (BACTROBAN) 2 % Use qd-bid on wounds  30 g  0  . NITROSTAT 0.4 MG SL tablet Place 0.4 mg under the tongue every 5 (five) minutes as needed.       . torsemide (DEMADEX) 100 MG tablet Take 0.5 tablets (50 mg total) by mouth daily.  30 tablet  3  . triamcinolone cream (KENALOG) 0.5 % APPLY TO AFFECTED AREA TWICE DAILY  270 g  0  . warfarin (COUMADIN) 5 MG tablet Take 5-7.5 mg by mouth daily. Takes 5 mg every other day, on the 'other day' take 7.5 mg       No current facility-administered medications for this visit.    Past Medical History  Diagnosis Date  . Gout   .  HTN (hypertension)   . Renal insufficiency   . Hyperlipidemia   . Osteoarthritis   . History of CVA (cerebrovascular accident)     Left pontine infarct July 2004; changed from Plavix to Coumadin in 2004 per MD at [facility]  . GERD (gastroesophageal reflux disease)   . DJD (degenerative joint disease)   . CHF (congestive heart failure)     35 - 40% Echo 4/12  . BPH (benign prostatic hypertrophy)   . Type II or unspecified type diabetes mellitus without mention of complication, not stated as uncontrolled   . CAD (coronary artery disease)     Non obstructive  . Stroke     Past Surgical History  Procedure Laterality Date  . None      ROS:  As stated in the HPI and negative for all other systems.  PHYSICAL EXAM BP 155/97  Pulse 81  Ht 6\' 2"  (1.88 m)  Wt 230 lb 6.4 oz (104.509 kg)   BMI 29.57 kg/m2 GENERAL:  Well appearing NECK:  6 cm jugular venous distention, waveform within normal limits, carotid upstroke brisk and symmetric, no bruits, no thyromegaly LYMPHATICS:  No cervical, inguinal adenopathy LUNGS:  Clear to auscultation bilaterally BACK:  No CVA tenderness CHEST:  Unremarkable HEART:  PMI not displaced or sustained,S1 and S2 within normal limits, no S3, no S4, no clicks, no rubs, no murmurs ABD:  Flat, positive bowel sounds normal in frequency in pitch, no bruits, no rebound, no guarding, no midline pulsatile mass, no hepatomegaly, no splenomegaly EXT:  2 plus pulses throughout, left greater than right mild to moderate edema, no cyanosis no clubbing SKIN:  No rashes no nodules, healing lower extremity left leg wound medial, right leg wound lateral NEURO:  Cranial nerves II through XII grossly intact, motor grossly intact throughout PSYCH:  Cognitively intact, oriented to person place and time  EKG:  Sinus rhythm, left bundle branch block, left axis deviation, premature ectopic complexes, rate 81. 04/24/2013  ASSESSMENT AND PLAN  CONGESTIVE HEART FAILURE -  I do think he has some excess volume and he has not been weighing himself daily. I have told him to take a little extra diuretic for the next 2 days. Going to get basic metabolic profile and BNP today. I reinforced daily weights. I ultimately consider CRT therapy in this gentleman.  HYPERTENSION -  His blood pressure is not well controlled. I will increase his labetalol to 3 times a day.  Encounter for long-term (current) use of anticoagulants - He has been on long-term anticoagulation since a stroke. I reviewed this. I think this is reasonable to continue. He has no problems taking warfarin.   PVD - Given the patient's abnormal ABIs and lower extremity ulcers I will refer him for a PT consult. He will continue to follow with the wound clinic.

## 2013-04-24 NOTE — Patient Instructions (Addendum)
Please increase your Labetalol to three times a day. Continue all other medications as listed.  Please have blood work today (BMP and BNP)  You have been referred to Dr Kirke Corin for the evaluation peripheral vascular disease.   Please return to see Dr Antoine Poche in 6 weeks.

## 2013-04-25 ENCOUNTER — Encounter (HOSPITAL_BASED_OUTPATIENT_CLINIC_OR_DEPARTMENT_OTHER): Payer: Medicare Other | Attending: General Surgery

## 2013-04-25 DIAGNOSIS — E1169 Type 2 diabetes mellitus with other specified complication: Secondary | ICD-10-CM | POA: Diagnosis not present

## 2013-04-25 DIAGNOSIS — L97909 Non-pressure chronic ulcer of unspecified part of unspecified lower leg with unspecified severity: Secondary | ICD-10-CM | POA: Insufficient documentation

## 2013-04-25 DIAGNOSIS — L988 Other specified disorders of the skin and subcutaneous tissue: Secondary | ICD-10-CM | POA: Diagnosis not present

## 2013-04-25 DIAGNOSIS — I87319 Chronic venous hypertension (idiopathic) with ulcer of unspecified lower extremity: Secondary | ICD-10-CM | POA: Insufficient documentation

## 2013-04-25 LAB — GLUCOSE, CAPILLARY: Glucose-Capillary: 142 mg/dL — ABNORMAL HIGH (ref 70–99)

## 2013-04-25 NOTE — Telephone Encounter (Signed)
Left mess for patient to call back.  

## 2013-04-25 NOTE — Telephone Encounter (Signed)
Pt's wife informed of below.  

## 2013-04-26 ENCOUNTER — Telehealth: Payer: Self-pay | Admitting: *Deleted

## 2013-04-26 DIAGNOSIS — I739 Peripheral vascular disease, unspecified: Secondary | ICD-10-CM

## 2013-04-26 NOTE — Telephone Encounter (Signed)
Will ref to vasc surg Dr Arbie Cookey Tacy Learn

## 2013-04-26 NOTE — Telephone Encounter (Signed)
Pt's wife requesting call back about pt's 04/19/13 doppler study. I informed her of Dr, Diamantina Monks advise. She wants to know what should he be doing for this. Please advise. Thanks!

## 2013-04-27 NOTE — Telephone Encounter (Signed)
Pt's spouse advised and will expect a call from [facility] with appt info

## 2013-04-30 ENCOUNTER — Ambulatory Visit: Payer: Medicare Other | Admitting: Internal Medicine

## 2013-04-30 DIAGNOSIS — Z0289 Encounter for other administrative examinations: Secondary | ICD-10-CM

## 2013-05-02 DIAGNOSIS — L97909 Non-pressure chronic ulcer of unspecified part of unspecified lower leg with unspecified severity: Secondary | ICD-10-CM | POA: Diagnosis not present

## 2013-05-02 DIAGNOSIS — L988 Other specified disorders of the skin and subcutaneous tissue: Secondary | ICD-10-CM | POA: Diagnosis not present

## 2013-05-02 DIAGNOSIS — E1169 Type 2 diabetes mellitus with other specified complication: Secondary | ICD-10-CM | POA: Diagnosis not present

## 2013-05-02 LAB — GLUCOSE, CAPILLARY: Glucose-Capillary: 140 mg/dL — ABNORMAL HIGH (ref 70–99)

## 2013-05-02 NOTE — Progress Notes (Signed)
Pt's wife aware and pt will repeat blood work tomorrow 05/03/13

## 2013-05-03 ENCOUNTER — Ambulatory Visit (INDEPENDENT_AMBULATORY_CARE_PROVIDER_SITE_OTHER): Payer: Medicare Other | Admitting: *Deleted

## 2013-05-03 ENCOUNTER — Ambulatory Visit (INDEPENDENT_AMBULATORY_CARE_PROVIDER_SITE_OTHER): Payer: Medicare Other

## 2013-05-03 DIAGNOSIS — I6350 Cerebral infarction due to unspecified occlusion or stenosis of unspecified cerebral artery: Secondary | ICD-10-CM | POA: Diagnosis not present

## 2013-05-03 DIAGNOSIS — E785 Hyperlipidemia, unspecified: Secondary | ICD-10-CM

## 2013-05-03 DIAGNOSIS — Z7901 Long term (current) use of anticoagulants: Secondary | ICD-10-CM

## 2013-05-03 DIAGNOSIS — I10 Essential (primary) hypertension: Secondary | ICD-10-CM

## 2013-05-03 LAB — BASIC METABOLIC PANEL
BUN: 19 mg/dL (ref 6–23)
CO2: 28 mEq/L (ref 19–32)
Calcium: 8.9 mg/dL (ref 8.4–10.5)
Creatinine, Ser: 2.2 mg/dL — ABNORMAL HIGH (ref 0.4–1.5)
Glucose, Bld: 139 mg/dL — ABNORMAL HIGH (ref 70–99)
Sodium: 143 mEq/L (ref 135–145)

## 2013-05-06 ENCOUNTER — Other Ambulatory Visit: Payer: Self-pay | Admitting: Internal Medicine

## 2013-05-08 ENCOUNTER — Ambulatory Visit: Payer: Medicare Other | Admitting: Cardiovascular Disease

## 2013-05-08 ENCOUNTER — Ambulatory Visit (INDEPENDENT_AMBULATORY_CARE_PROVIDER_SITE_OTHER): Payer: Medicare Other

## 2013-05-08 DIAGNOSIS — Z7901 Long term (current) use of anticoagulants: Secondary | ICD-10-CM

## 2013-05-08 DIAGNOSIS — I6350 Cerebral infarction due to unspecified occlusion or stenosis of unspecified cerebral artery: Secondary | ICD-10-CM

## 2013-05-09 DIAGNOSIS — L988 Other specified disorders of the skin and subcutaneous tissue: Secondary | ICD-10-CM | POA: Diagnosis not present

## 2013-05-09 DIAGNOSIS — E1169 Type 2 diabetes mellitus with other specified complication: Secondary | ICD-10-CM | POA: Diagnosis not present

## 2013-05-09 DIAGNOSIS — I87319 Chronic venous hypertension (idiopathic) with ulcer of unspecified lower extremity: Secondary | ICD-10-CM | POA: Diagnosis not present

## 2013-05-10 ENCOUNTER — Encounter: Payer: Self-pay | Admitting: Vascular Surgery

## 2013-05-10 ENCOUNTER — Ambulatory Visit (INDEPENDENT_AMBULATORY_CARE_PROVIDER_SITE_OTHER): Payer: Medicare Other | Admitting: Vascular Surgery

## 2013-05-10 VITALS — BP 119/79 | HR 69 | Resp 16 | Ht 74.5 in | Wt 219.0 lb

## 2013-05-10 DIAGNOSIS — I70229 Atherosclerosis of native arteries of extremities with rest pain, unspecified extremity: Secondary | ICD-10-CM | POA: Diagnosis not present

## 2013-05-10 DIAGNOSIS — M79609 Pain in unspecified limb: Secondary | ICD-10-CM | POA: Diagnosis not present

## 2013-05-10 DIAGNOSIS — I739 Peripheral vascular disease, unspecified: Secondary | ICD-10-CM | POA: Diagnosis not present

## 2013-05-10 DIAGNOSIS — L98499 Non-pressure chronic ulcer of skin of other sites with unspecified severity: Secondary | ICD-10-CM

## 2013-05-10 DIAGNOSIS — I7025 Atherosclerosis of native arteries of other extremities with ulceration: Secondary | ICD-10-CM | POA: Insufficient documentation

## 2013-05-10 NOTE — Progress Notes (Addendum)
[facility] [place] HISTORY AND PHYSICAL   History of Present Illness:  Patient is a 72 y.o. year old male who presents for evaluation of poorly healing ulcers on his legs. The patient apparently dropped a board on the back of his legs approximately 4-6 weeks ago. The wounds have not completely healed. He is sent for further evaluation of this. He is seen at the request of Dr. Jimmey Ralph. The patient denies any prior history of nonhealing wounds. He denies claudication. He does have pain in the ulcers..  Other medical problems include hypertension, renal insufficiency, hyperlipidemia, prior stroke (on Coumadin for this), congestive heart failure, diabetes, coronary disease. These are all currently stable. He is followed by Dr. Posey Rea.  Past Medical History  Diagnosis Date  . Gout   . HTN (hypertension)   . Renal insufficiency   . Hyperlipidemia   . Osteoarthritis   . History of CVA (cerebrovascular accident)     Left pontine infarct July 2004; changed from Plavix to Coumadin in 2004 per MD at [facility]  . GERD (gastroesophageal reflux disease)   . DJD (degenerative joint disease)   . CHF (congestive heart failure)     35 - 40% Echo 4/12  . BPH (benign prostatic hypertrophy)   . Type II or unspecified type diabetes mellitus without mention of complication, not stated as uncontrolled   . CAD (coronary artery disease)     Non obstructive  . Stroke     Past Surgical History  Procedure Laterality Date  . None       Social History History  Substance Use Topics  . Smoking status: Former Smoker    Quit date: 04/12/1974  . Smokeless tobacco: Never Used  . Alcohol Use: No    Family History Family History  Problem Relation Age of Onset  . Hypertension Mother   . Heart disease Mother   . Diabetes Mother   . Gout Other   . Stroke Other   . Hypertension Father   . Heart disease Father     Allergies  No Active Allergies   Current Outpatient Prescriptions   Medication Sig Dispense Refill  . amLODipine (NORVASC) 10 MG tablet Take 1 tablet (10 mg total) by mouth daily.  30 tablet  11  . atorvastatin (LIPITOR) 20 MG tablet TAKE 1 TABLET EVERY DAY FOR CHOLESTEROL  30 tablet  6  . budesonide-formoterol (SYMBICORT) 160-4.5 MCG/ACT inhaler Inhale 2 puffs into the lungs 2 (two) times daily.      . Cholecalciferol 1000 UNITS tablet Take 1,000 Units by mouth daily.        . cloNIDine (CATAPRES) 0.2 MG tablet Take 1 tablet (0.2 mg total) by mouth 3 (three) times daily.  90 tablet  8  . clotrimazole-betamethasone (LOTRISONE) cream Apply topically 2 (two) times daily as needed. Fungal infection      . cyanocobalamin 1000 MCG tablet Take 1 tablet (1,000 mcg total) by mouth daily.  100 tablet  3  . doxycycline (DORYX) 100 MG EC tablet Take 100 mg by mouth 2 (two) times daily.      . Fluticasone Furoate-Vilanterol (BREO ELLIPTA) 100-25 MCG/INH AEPB Inhale 1 Act into the lungs daily.  1 each  5  . glipiZIDE (GLUCOTROL) 5 MG tablet TAKE 1/2 TABLET BY MOUTH TWICE DAILY BEFORE MEALS  30 tablet  5  . hydrALAZINE (APRESOLINE) 50 MG tablet TAKE 1 AND 1/2 TABLETS THREE TIMES A DAY  135 tablet  8  . HYDROcodone-acetaminophen (NORCO/VICODIN) 5-325  MG per tablet Take 1 tablet by mouth every 4 (four) hours as needed for pain.  90 tablet  0  . isosorbide mononitrate (IMDUR) 120 MG 24 hr tablet TAKE 1 TABLET (120 MG TOTAL) BY MOUTH EVERY MORNING.  30 tablet  8  . KLOR-CON M20 20 MEQ tablet TAKE 1 TABLET BY MOUTH TWICE DAILY.  60 tablet  5  . labetalol (NORMODYNE) 200 MG tablet Take 1 tablet (200 mg total) by mouth 3 (three) times daily.  90 tablet  11  . mupirocin ointment (BACTROBAN) 2 % Use qd-bid on wounds  30 g  0  . NITROSTAT 0.4 MG SL tablet Place 0.4 mg under the tongue every 5 (five) minutes as needed.       . torsemide (DEMADEX) 100 MG tablet Take 0.5 tablets (50 mg total) by mouth daily.  30 tablet  3  . triamcinolone cream (KENALOG) 0.5 % APPLY TO AFFECTED AREA TWICE  DAILY  270 g  0  . warfarin (COUMADIN) 5 MG tablet Take 5-7.5 mg by mouth daily. Takes 5 mg every other day, on the 'other day' take 7.5 mg       No current facility-administered medications for this visit.    ROS:   General:  No weight loss, Fever, chills  HEENT: No recent headaches, no nasal bleeding, no visual changes, no sore throat  Neurologic: No dizziness, blackouts, seizures. No recent symptoms of stroke or mini- stroke. No recent episodes of slurred speech, or temporary blindness.  Cardiac: No recent episodes of chest pain/pressure, no shortness of breath at rest.  + shortness of breath with exertion.  Denies history of atrial fibrillation or irregular heartbeat  Vascular: No history of rest pain in feet.  No history of claudication.  No history of non-healing ulcer, No history of DVT   Pulmonary: No home oxygen, no productive cough, no hemoptysis,  No asthma or wheezing  Musculoskeletal:  [ ]  Arthritis, [ ]  Low back pain,  [ ]  Joint pain  Hematologic:No history of hypercoagulable state.  No history of easy bleeding.  No history of anemia  Gastrointestinal: No hematochezia or melena,  No gastroesophageal reflux, no trouble swallowing  Urinary: [x ] chronic Kidney disease, [ ]  on HD - [ ]  MWF or [ ]  TTHS, [ ]  Burning with urination, [ ]  Frequent urination, [ ]  Difficulty urinating;   Skin: No rashes  Psychological: No history of anxiety,  No history of depression   Physical Examination  Filed Vitals:   05/10/13 1523  BP: 119/79  Pulse: 69  Resp: 16  Height: 6' 2.5" (1.892 m)  Weight: 219 lb (99.338 kg)  SpO2: 99%    Body mass index is 27.75 kg/(m^2).  General:  Alert and oriented, no acute distress HEENT: Normal Neck: No bruit or JVD Pulmonary: Clear to auscultation bilaterally Cardiac: Regular Rate and Rhythm without murmur Abdomen: Soft, non-tender, non-distended, no mass Skin: No rash, left posterior ulcer on leg over Achilles area 5 x 7 cm  approximately 3 mm in depth with some evidence of granulation. Right posterior calf over Achilles 3 x 4 cm ulceration of similar appearance Extremity Pulses:  2+ radial, brachial, femoral, absent dorsalis pedis, posterior tibial pulses bilaterally Musculoskeletal: No deformity or edema  Neurologic: Upper and lower extremity motor 5/5 and symmetric  DATA: Arterial duplex from [place] dated 04/19/2013 was reviewed today. ABI on the right was 0.8 left 0.88 suggestion of tibial disease bilaterally   ASSESSMENT: Nonhealing wounds bilateral  Achilles area with abnormal arterial duplex   PLAN:  Aortogram bilateral lower extremity runoff possible intervention scheduled for May 30. We'll need to stop his Coumadin 3 days prior to this. I also discussed the patient and his wife today that if his creatinine is elevated we will need to do have discussions whether or not to proceed and risk contrast nephropathy. They understand. Other risks benefits possible complications of the procedure were also discussed with patient and his family today.  Fabienne Bruns, MD [facility] [place] Office: [phone] Pager: [phone]  For VQI Use Only    PRE-ADM LIVING: Home  AMB STATUS: Ambulatory  CAD Sx: History of MI, but no symptoms No MI within 6 months  PRIOR CHF: Severe  STRESS TEST: [x ] No, [ ]  Normal, [ ]  + ischemia, [ ]  + MI, [ ]  Both

## 2013-05-11 DIAGNOSIS — Z9181 History of falling: Secondary | ICD-10-CM | POA: Diagnosis not present

## 2013-05-11 DIAGNOSIS — S81009A Unspecified open wound, unspecified knee, initial encounter: Secondary | ICD-10-CM | POA: Diagnosis not present

## 2013-05-11 DIAGNOSIS — E119 Type 2 diabetes mellitus without complications: Secondary | ICD-10-CM | POA: Diagnosis not present

## 2013-05-11 DIAGNOSIS — I10 Essential (primary) hypertension: Secondary | ICD-10-CM | POA: Diagnosis not present

## 2013-05-11 DIAGNOSIS — S91009A Unspecified open wound, unspecified ankle, initial encounter: Secondary | ICD-10-CM | POA: Diagnosis not present

## 2013-05-11 DIAGNOSIS — I509 Heart failure, unspecified: Secondary | ICD-10-CM | POA: Diagnosis not present

## 2013-05-11 DIAGNOSIS — I872 Venous insufficiency (chronic) (peripheral): Secondary | ICD-10-CM | POA: Diagnosis not present

## 2013-05-12 ENCOUNTER — Other Ambulatory Visit: Payer: Self-pay | Admitting: Internal Medicine

## 2013-05-14 DIAGNOSIS — I872 Venous insufficiency (chronic) (peripheral): Secondary | ICD-10-CM | POA: Diagnosis not present

## 2013-05-14 DIAGNOSIS — E119 Type 2 diabetes mellitus without complications: Secondary | ICD-10-CM | POA: Diagnosis not present

## 2013-05-14 DIAGNOSIS — I10 Essential (primary) hypertension: Secondary | ICD-10-CM | POA: Diagnosis not present

## 2013-05-14 DIAGNOSIS — S81809A Unspecified open wound, unspecified lower leg, initial encounter: Secondary | ICD-10-CM | POA: Diagnosis not present

## 2013-05-14 DIAGNOSIS — Z9181 History of falling: Secondary | ICD-10-CM | POA: Diagnosis not present

## 2013-05-14 DIAGNOSIS — I509 Heart failure, unspecified: Secondary | ICD-10-CM | POA: Diagnosis not present

## 2013-05-15 ENCOUNTER — Other Ambulatory Visit: Payer: Self-pay | Admitting: Internal Medicine

## 2013-05-15 ENCOUNTER — Other Ambulatory Visit: Payer: Self-pay

## 2013-05-15 ENCOUNTER — Ambulatory Visit (INDEPENDENT_AMBULATORY_CARE_PROVIDER_SITE_OTHER): Payer: Medicare Other | Admitting: Pharmacist

## 2013-05-15 DIAGNOSIS — I6350 Cerebral infarction due to unspecified occlusion or stenosis of unspecified cerebral artery: Secondary | ICD-10-CM

## 2013-05-15 DIAGNOSIS — Z7901 Long term (current) use of anticoagulants: Secondary | ICD-10-CM | POA: Diagnosis not present

## 2013-05-15 LAB — POCT INR: INR: 3.6

## 2013-05-16 DIAGNOSIS — L988 Other specified disorders of the skin and subcutaneous tissue: Secondary | ICD-10-CM | POA: Diagnosis not present

## 2013-05-16 DIAGNOSIS — L97909 Non-pressure chronic ulcer of unspecified part of unspecified lower leg with unspecified severity: Secondary | ICD-10-CM | POA: Diagnosis not present

## 2013-05-16 DIAGNOSIS — E1169 Type 2 diabetes mellitus with other specified complication: Secondary | ICD-10-CM | POA: Diagnosis not present

## 2013-05-18 ENCOUNTER — Encounter (HOSPITAL_COMMUNITY)
Admission: RE | Disposition: A | Discharge status: Home / Self Care | Payer: Self-pay | Source: Ambulatory Visit | Attending: Vascular Surgery

## 2013-05-18 ENCOUNTER — Telehealth: Payer: Self-pay | Admitting: Vascular Surgery

## 2013-05-18 ENCOUNTER — Ambulatory Visit (HOSPITAL_COMMUNITY)
Admission: RE | Admit: 2013-05-18 | Discharge: 2013-05-18 | Disposition: A | Discharge status: Home / Self Care | Payer: Medicare Other | Source: Ambulatory Visit | Attending: Vascular Surgery | Admitting: Vascular Surgery

## 2013-05-18 DIAGNOSIS — Z7901 Long term (current) use of anticoagulants: Secondary | ICD-10-CM | POA: Insufficient documentation

## 2013-05-18 DIAGNOSIS — M109 Gout, unspecified: Secondary | ICD-10-CM | POA: Insufficient documentation

## 2013-05-18 DIAGNOSIS — Z79899 Other long term (current) drug therapy: Secondary | ICD-10-CM | POA: Diagnosis not present

## 2013-05-18 DIAGNOSIS — I739 Peripheral vascular disease, unspecified: Secondary | ICD-10-CM | POA: Insufficient documentation

## 2013-05-18 DIAGNOSIS — I771 Stricture of artery: Secondary | ICD-10-CM | POA: Diagnosis not present

## 2013-05-18 DIAGNOSIS — N289 Disorder of kidney and ureter, unspecified: Secondary | ICD-10-CM | POA: Insufficient documentation

## 2013-05-18 DIAGNOSIS — K219 Gastro-esophageal reflux disease without esophagitis: Secondary | ICD-10-CM | POA: Insufficient documentation

## 2013-05-18 DIAGNOSIS — L97809 Non-pressure chronic ulcer of other part of unspecified lower leg with unspecified severity: Secondary | ICD-10-CM | POA: Insufficient documentation

## 2013-05-18 DIAGNOSIS — M1990 Unspecified osteoarthritis, unspecified site: Secondary | ICD-10-CM | POA: Insufficient documentation

## 2013-05-18 DIAGNOSIS — Z8673 Personal history of transient ischemic attack (TIA), and cerebral infarction without residual deficits: Secondary | ICD-10-CM | POA: Insufficient documentation

## 2013-05-18 DIAGNOSIS — I2510 Atherosclerotic heart disease of native coronary artery without angina pectoris: Secondary | ICD-10-CM | POA: Diagnosis not present

## 2013-05-18 DIAGNOSIS — Z87891 Personal history of nicotine dependence: Secondary | ICD-10-CM | POA: Diagnosis not present

## 2013-05-18 DIAGNOSIS — I7092 Chronic total occlusion of artery of the extremities: Secondary | ICD-10-CM | POA: Insufficient documentation

## 2013-05-18 DIAGNOSIS — L98499 Non-pressure chronic ulcer of skin of other sites with unspecified severity: Secondary | ICD-10-CM | POA: Diagnosis not present

## 2013-05-18 DIAGNOSIS — N400 Enlarged prostate without lower urinary tract symptoms: Secondary | ICD-10-CM | POA: Diagnosis not present

## 2013-05-18 DIAGNOSIS — I509 Heart failure, unspecified: Secondary | ICD-10-CM | POA: Diagnosis not present

## 2013-05-18 DIAGNOSIS — I10 Essential (primary) hypertension: Secondary | ICD-10-CM | POA: Insufficient documentation

## 2013-05-18 DIAGNOSIS — E119 Type 2 diabetes mellitus without complications: Secondary | ICD-10-CM | POA: Diagnosis not present

## 2013-05-18 DIAGNOSIS — Z8249 Family history of ischemic heart disease and other diseases of the circulatory system: Secondary | ICD-10-CM | POA: Diagnosis not present

## 2013-05-18 DIAGNOSIS — E785 Hyperlipidemia, unspecified: Secondary | ICD-10-CM | POA: Insufficient documentation

## 2013-05-18 HISTORY — PX: LOWER EXTREMITY ANGIOGRAM: SHX5508

## 2013-05-18 HISTORY — PX: ABDOMINAL AORTAGRAM: SHX5454

## 2013-05-18 LAB — POCT I-STAT, CHEM 8
BUN: 26 mg/dL — ABNORMAL HIGH (ref 6–23)
Calcium, Ion: 1.18 mmol/L (ref 1.13–1.30)
Chloride: 109 meq/L (ref 96–112)
Creatinine, Ser: 2.2 mg/dL — ABNORMAL HIGH (ref 0.50–1.35)
Glucose, Bld: 110 mg/dL — ABNORMAL HIGH (ref 70–99)
HCT: 31 % — ABNORMAL LOW (ref 39.0–52.0)
Hemoglobin: 10.5 g/dL — ABNORMAL LOW (ref 13.0–17.0)
Potassium: 3.7 meq/L (ref 3.5–5.1)
Sodium: 145 meq/L (ref 135–145)
TCO2: 25 mmol/L (ref 0–100)

## 2013-05-18 LAB — GLUCOSE, CAPILLARY: Glucose-Capillary: 118 mg/dL — ABNORMAL HIGH (ref 70–99)

## 2013-05-18 SURGERY — ABDOMINAL AORTAGRAM
Anesthesia: LOCAL

## 2013-05-18 MED ORDER — MORPHINE SULFATE 2 MG/ML IJ SOLN
INTRAMUSCULAR | Status: AC
  Administered 2013-05-18: 2 mg via INTRAMUSCULAR
  Filled 2013-05-18: qty 1

## 2013-05-18 MED ORDER — HYDRALAZINE HCL 20 MG/ML IJ SOLN: 10.0000 mg | INTRAMUSCULAR | Status: DC | PRN

## 2013-05-18 MED ORDER — POTASSIUM CHLORIDE CRYS ER 20 MEQ PO TBCR: 20.0000 meq | EXTENDED_RELEASE_TABLET | Freq: Once | ORAL | Status: DC | PRN

## 2013-05-18 MED ORDER — ACETAMINOPHEN 325 MG PO TABS: 325.0000 mg | ORAL_TABLET | ORAL | Status: DC | PRN

## 2013-05-18 MED ORDER — LABETALOL HCL 5 MG/ML IV SOLN: 10.0000 mg | INTRAVENOUS | Status: DC | PRN

## 2013-05-18 MED ORDER — FENTANYL CITRATE 0.05 MG/ML IJ SOLN
INTRAMUSCULAR | Status: AC
  Filled 2013-05-18: qty 2

## 2013-05-18 MED ORDER — METOPROLOL TARTRATE 1 MG/ML IV SOLN: 2.0000 mg | INTRAVENOUS | Status: DC | PRN

## 2013-05-18 MED ORDER — MAGNESIUM SULFATE 40 MG/ML IJ SOLN: 2.0000 g | Freq: Once | INTRAMUSCULAR | Status: DC | PRN

## 2013-05-18 MED ORDER — SODIUM CHLORIDE 0.9 % IV SOLN: INTRAVENOUS | Status: DC

## 2013-05-18 MED ORDER — VITAMIN K1 10 MG/ML IJ SOLN
5.0000 mg | INTRAVENOUS | Status: AC
  Administered 2013-05-18: 5 mg via INTRAVENOUS
  Filled 2013-05-18: qty 0.5

## 2013-05-18 MED ORDER — ONDANSETRON HCL 4 MG/2ML IJ SOLN: 4.0000 mg | Freq: Four times a day (QID) | INTRAMUSCULAR | Status: DC | PRN

## 2013-05-18 MED ORDER — ACETAMINOPHEN 325 MG RE SUPP: 325.0000 mg | RECTAL | Status: DC | PRN

## 2013-05-18 MED ORDER — SODIUM CHLORIDE 0.45 % IV SOLN: INTRAVENOUS | Status: DC

## 2013-05-18 MED ORDER — HEPARIN (PORCINE) IN NACL 2-0.9 UNIT/ML-% IJ SOLN
INTRAMUSCULAR | Status: AC
  Filled 2013-05-18: qty 1000

## 2013-05-18 MED ORDER — MORPHINE SULFATE 10 MG/ML IJ SOLN: 2.0000 mg | INTRAMUSCULAR | Status: DC | PRN

## 2013-05-18 MED ORDER — SODIUM CHLORIDE 0.9 % IV SOLN
INTRAVENOUS | Status: DC
  Administered 2013-05-18: 1000 mL via INTRAVENOUS

## 2013-05-18 MED ORDER — DEXTROSE 5 % IV SOLN: 1.5000 g | Freq: Two times a day (BID) | INTRAVENOUS | Status: DC

## 2013-05-18 MED ORDER — HYDRALAZINE HCL 20 MG/ML IJ SOLN
INTRAMUSCULAR | Status: AC
  Filled 2013-05-18: qty 1

## 2013-05-18 MED ORDER — LIDOCAINE HCL (PF) 1 % IJ SOLN
INTRAMUSCULAR | Status: AC
  Filled 2013-05-18: qty 30

## 2013-05-18 MED ORDER — LABETALOL HCL 5 MG/ML IV SOLN
INTRAVENOUS | Status: AC
  Filled 2013-05-18: qty 4

## 2013-05-18 NOTE — Op Note (Signed)
Procedure: Aortogram with bilateral lower extremity runoff Preoperative diagnosis: Nonhealing wounds bilateral lower extremities  Postoperative diagnosis: Same  Anesthesia: Local  Operative details: After obtaining informed consent, the patient was taken to the PV lab. The patient was placed in supine position the Angio table. Both groins were prepped and draped in usual sterile fashion. Local anesthesia was infiltrated over the right common femoral artery. Ultrasound was used to identify the right common femoral artery and femoral bifurcation. An introducer needle was then used to cannulate the right common femoral artery under ultrasound guidance. An 56 Versacore wire was then threaded in the abdominal aorta under fluoroscopic guidance. A 5 French sheath was placed over the guidewire into the right common femoral artery. A 5 French pigtail catheter was then placed over the guidewire into the abdominal aorta.  An abdominal aortogram was identified and using carbon dioxide contrast. Infrarenal abdominal aorta is patent. The left and right common iliac arteries are patent. The right renal artery is patent. The left renal artery is not well visualized. Next a 5 French pigtail catheter was removed over a guidewire and replaced with a 5 [place] crossover catheter. The patient had fairly significant iliac tortuosity. I was able to cannulate the left common iliac artery using the 5 French crossover catheter. An 035 angled Glidewire was then brought up in the operative field this was advanced deep into the left common femoral artery. A 5 French straight catheter was then placed over the angled Glidewire into the distal left external iliac artery. Contrast angiogram was then performed of the left lower extremity again using carbon dioxide. The left common femoral profunda femoris and superficial femoral arteries are widely patent. The left popliteal artery is widely patent. There is not good opacification of the  vessels with CO2 below the level of the knee. Therefore 50% diluted contrast was used to opacify the distal left leg. The below-knee popliteal artery is patent. The peroneal and anterior tibial arteries are occluded. There is single vessel runoff via the posterior tibial artery. This is in continuity although it of the foot. There are 2 high-grade stenoses 80% or more in the middle and distal third of the posterior tibial artery. At this point the 5 French straight catheter was removed over a guidewire. Contrast angiogram was then performed through the 5 French sheath in the right lower extremity. Again these proximal views were obtained with carbon dioxide. The right common femoral artery is patent. The right profunda femoris and superficial femoral arteries are patent. The popliteal artery is patent. Again there was poor opacification of contrast in the tibial vessels. Therefore standard contrast opacification was used. The right anterior tibial artery is occluded. The posterior tibial artery is the single runoff vessel to the right foot. There is one 40% stenosis distally but overall this vessel is patent. The peroneal artery is occluded. At this point a 5 French sheath was left in place to be pulled in the holding area. The patient tolerated procedure well and there were no complications.  Operative management: Currently the patient's calf ulcers have improved somewhat since his office visit. I believe a trial of observation is worthwhile. Would not consider any interventions in the right lower extremity. He essentially has in-line flow via the posterior tibial artery. In the left lower extremity if the wound is having difficulty healing we could consider angioplasty of the distal and mid posterior tibial artery. However for a calf wound he should have fairly reasonable perfusion overall. I will schedule the  patient for followup office visit next week.  Fabienne Bruns, MD [facility]  [place] Office: [phone] Pager: [phone]

## 2013-05-18 NOTE — Telephone Encounter (Addendum)
Message copied by Rosalyn Charters on Fri May 18, 2013 12:20 PM ------      Message from: Sherren Kerns      Created: Fri May 18, 2013 11:06 AM       Aortogram with bilat runoff      3rd order cath left leg      Ultrasound was used            He needs an appt with me next week to recheck his wounds            Leonette Most  notified patient of fu appt. with dr. Darrick Penna on 05-31-13 2:45 ------

## 2013-05-18 NOTE — H&P (View-Only) (Signed)
[facility] [place] HISTORY AND PHYSICAL   History of Present Illness:  Patient is a 71 y.o. year old male who presents for evaluation of poorly healing ulcers on his legs. The patient apparently dropped a board on the back of his legs approximately 4-6 weeks ago. The wounds have not completely healed. He is sent for further evaluation of this. He is seen at the request of Dr. Parker. The patient denies any prior history of nonhealing wounds. He denies claudication. He does have pain in the ulcers..  Other medical problems include hypertension, renal insufficiency, hyperlipidemia, prior stroke (on Coumadin for this), congestive heart failure, diabetes, coronary disease. These are all currently stable. He is followed by Dr. Plotnikov.  Past Medical History  Diagnosis Date  . Gout   . HTN (hypertension)   . Renal insufficiency   . Hyperlipidemia   . Osteoarthritis   . History of CVA (cerebrovascular accident)     Left pontine infarct July 2004; changed from Plavix to Coumadin in 2004 per MD at [facility]  . GERD (gastroesophageal reflux disease)   . DJD (degenerative joint disease)   . CHF (congestive heart failure)     35 - 40% Echo 4/12  . BPH (benign prostatic hypertrophy)   . Type II or unspecified type diabetes mellitus without mention of complication, not stated as uncontrolled   . CAD (coronary artery disease)     Non obstructive  . Stroke     Past Surgical History  Procedure Laterality Date  . None       Social History History  Substance Use Topics  . Smoking status: Former Smoker    Quit date: 04/12/1974  . Smokeless tobacco: Never Used  . Alcohol Use: No    Family History Family History  Problem Relation Age of Onset  . Hypertension Mother   . Heart disease Mother   . Diabetes Mother   . Gout Other   . Stroke Other   . Hypertension Father   . Heart disease Father     Allergies  No Active Allergies   Current Outpatient Prescriptions   Medication Sig Dispense Refill  . amLODipine (NORVASC) 10 MG tablet Take 1 tablet (10 mg total) by mouth daily.  30 tablet  11  . atorvastatin (LIPITOR) 20 MG tablet TAKE 1 TABLET EVERY DAY FOR CHOLESTEROL  30 tablet  6  . budesonide-formoterol (SYMBICORT) 160-4.5 MCG/ACT inhaler Inhale 2 puffs into the lungs 2 (two) times daily.      . Cholecalciferol 1000 UNITS tablet Take 1,000 Units by mouth daily.        . cloNIDine (CATAPRES) 0.2 MG tablet Take 1 tablet (0.2 mg total) by mouth 3 (three) times daily.  90 tablet  8  . clotrimazole-betamethasone (LOTRISONE) cream Apply topically 2 (two) times daily as needed. Fungal infection      . cyanocobalamin 1000 MCG tablet Take 1 tablet (1,000 mcg total) by mouth daily.  100 tablet  3  . doxycycline (DORYX) 100 MG EC tablet Take 100 mg by mouth 2 (two) times daily.      . Fluticasone Furoate-Vilanterol (BREO ELLIPTA) 100-25 MCG/INH AEPB Inhale 1 Act into the lungs daily.  1 each  5  . glipiZIDE (GLUCOTROL) 5 MG tablet TAKE 1/2 TABLET BY MOUTH TWICE DAILY BEFORE MEALS  30 tablet  5  . hydrALAZINE (APRESOLINE) 50 MG tablet TAKE 1 AND 1/2 TABLETS THREE TIMES A DAY  135 tablet  8  . HYDROcodone-acetaminophen (NORCO/VICODIN) 5-325   MG per tablet Take 1 tablet by mouth every 4 (four) hours as needed for pain.  90 tablet  0  . isosorbide mononitrate (IMDUR) 120 MG 24 hr tablet TAKE 1 TABLET (120 MG TOTAL) BY MOUTH EVERY MORNING.  30 tablet  8  . KLOR-CON M20 20 MEQ tablet TAKE 1 TABLET BY MOUTH TWICE DAILY.  60 tablet  5  . labetalol (NORMODYNE) 200 MG tablet Take 1 tablet (200 mg total) by mouth 3 (three) times daily.  90 tablet  11  . mupirocin ointment (BACTROBAN) 2 % Use qd-bid on wounds  30 g  0  . NITROSTAT 0.4 MG SL tablet Place 0.4 mg under the tongue every 5 (five) minutes as needed.       . torsemide (DEMADEX) 100 MG tablet Take 0.5 tablets (50 mg total) by mouth daily.  30 tablet  3  . triamcinolone cream (KENALOG) 0.5 % APPLY TO AFFECTED AREA TWICE  DAILY  270 g  0  . warfarin (COUMADIN) 5 MG tablet Take 5-7.5 mg by mouth daily. Takes 5 mg every other day, on the 'other day' take 7.5 mg       No current facility-administered medications for this visit.    ROS:   General:  No weight loss, Fever, chills  HEENT: No recent headaches, no nasal bleeding, no visual changes, no sore throat  Neurologic: No dizziness, blackouts, seizures. No recent symptoms of stroke or mini- stroke. No recent episodes of slurred speech, or temporary blindness.  Cardiac: No recent episodes of chest pain/pressure, no shortness of breath at rest.  + shortness of breath with exertion.  Denies history of atrial fibrillation or irregular heartbeat  Vascular: No history of rest pain in feet.  No history of claudication.  No history of non-healing ulcer, No history of DVT   Pulmonary: No home oxygen, no productive cough, no hemoptysis,  No asthma or wheezing  Musculoskeletal:  [ ] Arthritis, [ ] Low back pain,  [ ] Joint pain  Hematologic:No history of hypercoagulable state.  No history of easy bleeding.  No history of anemia  Gastrointestinal: No hematochezia or melena,  No gastroesophageal reflux, no trouble swallowing  Urinary: [x ] chronic Kidney disease, [ ] on HD - [ ] MWF or [ ] TTHS, [ ] Burning with urination, [ ] Frequent urination, [ ] Difficulty urinating;   Skin: No rashes  Psychological: No history of anxiety,  No history of depression   Physical Examination  Filed Vitals:   05/10/13 1523  BP: 119/79  Pulse: 69  Resp: 16  Height: 6' 2.5" (1.892 m)  Weight: 219 lb (99.338 kg)  SpO2: 99%    Body mass index is 27.75 kg/(m^2).  General:  Alert and oriented, no acute distress HEENT: Normal Neck: No bruit or JVD Pulmonary: Clear to auscultation bilaterally Cardiac: Regular Rate and Rhythm without murmur Abdomen: Soft, non-tender, non-distended, no mass Skin: No rash, left posterior ulcer on leg over Achilles area 5 x 7 cm  approximately 3 mm in depth with some evidence of granulation. Right posterior calf over Achilles 3 x 4 cm ulceration of similar appearance Extremity Pulses:  2+ radial, brachial, femoral, absent dorsalis pedis, posterior tibial pulses bilaterally Musculoskeletal: No deformity or edema  Neurologic: Upper and lower extremity motor 5/5 and symmetric  DATA: Arterial duplex from [facility] dated 04/19/2013 was reviewed today. ABI on the right was 0.8 left 0.88 suggestion of tibial disease bilaterally   ASSESSMENT: Nonhealing wounds bilateral   Achilles area with abnormal arterial duplex   PLAN:  Aortogram bilateral lower extremity runoff possible intervention scheduled for May 30. We'll need to stop his Coumadin 3 days prior to this. I also discussed the patient and his wife today that if his creatinine is elevated we will need to do have discussions whether or not to proceed and risk contrast nephropathy. They understand. Other risks benefits possible complications of the procedure were also discussed with patient and his family today.  Jaekwon Mcclune, MD [facility] [place] Office: [phone] Pager: [phone]  For VQI Use Only    PRE-ADM LIVING: Home  AMB STATUS: Ambulatory  CAD Sx: History of MI, but no symptoms No MI within 6 months  PRIOR CHF: Severe  STRESS TEST: [x ] No, [ ] Normal, [ ] + ischemia, [ ] + MI, [ ] Both    

## 2013-05-18 NOTE — Interval H&P Note (Signed)
History and Physical Interval Note:  05/18/2013 9:44 AM  Frank Mclean  has presented today for surgery, with the diagnosis of PVD  The various methods of treatment have been discussed with the patient and family. After consideration of risks, benefits and other options for treatment, the patient has consented to  Procedure(s): ABDOMINAL AORTAGRAM (N/A) as a surgical intervention .  [name] history has been reviewed, patient examined, no change in status, stable for surgery.  I have reviewed the patient's chart and labs.  Questions were answered to the patient's satisfaction.     [name] E

## 2013-05-18 NOTE — Progress Notes (Signed)
Victorino Dike O'Neal advised BUN and Creatnine are elevated

## 2013-05-19 DIAGNOSIS — I10 Essential (primary) hypertension: Secondary | ICD-10-CM | POA: Diagnosis not present

## 2013-05-19 DIAGNOSIS — I872 Venous insufficiency (chronic) (peripheral): Secondary | ICD-10-CM | POA: Diagnosis not present

## 2013-05-19 DIAGNOSIS — E119 Type 2 diabetes mellitus without complications: Secondary | ICD-10-CM | POA: Diagnosis not present

## 2013-05-19 DIAGNOSIS — I509 Heart failure, unspecified: Secondary | ICD-10-CM | POA: Diagnosis not present

## 2013-05-19 DIAGNOSIS — S91009A Unspecified open wound, unspecified ankle, initial encounter: Secondary | ICD-10-CM | POA: Diagnosis not present

## 2013-05-19 DIAGNOSIS — Z9181 History of falling: Secondary | ICD-10-CM | POA: Diagnosis not present

## 2013-05-21 DIAGNOSIS — E119 Type 2 diabetes mellitus without complications: Secondary | ICD-10-CM | POA: Diagnosis not present

## 2013-05-21 DIAGNOSIS — S81009A Unspecified open wound, unspecified knee, initial encounter: Secondary | ICD-10-CM | POA: Diagnosis not present

## 2013-05-21 DIAGNOSIS — Z9181 History of falling: Secondary | ICD-10-CM | POA: Diagnosis not present

## 2013-05-21 DIAGNOSIS — I872 Venous insufficiency (chronic) (peripheral): Secondary | ICD-10-CM | POA: Diagnosis not present

## 2013-05-21 DIAGNOSIS — I509 Heart failure, unspecified: Secondary | ICD-10-CM | POA: Diagnosis not present

## 2013-05-21 DIAGNOSIS — I10 Essential (primary) hypertension: Secondary | ICD-10-CM | POA: Diagnosis not present

## 2013-05-23 ENCOUNTER — Encounter (HOSPITAL_BASED_OUTPATIENT_CLINIC_OR_DEPARTMENT_OTHER): Payer: Medicare Other | Attending: General Surgery

## 2013-05-23 DIAGNOSIS — I2510 Atherosclerotic heart disease of native coronary artery without angina pectoris: Secondary | ICD-10-CM | POA: Insufficient documentation

## 2013-05-23 DIAGNOSIS — Z8673 Personal history of transient ischemic attack (TIA), and cerebral infarction without residual deficits: Secondary | ICD-10-CM | POA: Diagnosis not present

## 2013-05-23 DIAGNOSIS — Z8639 Personal history of other endocrine, nutritional and metabolic disease: Secondary | ICD-10-CM | POA: Diagnosis not present

## 2013-05-23 DIAGNOSIS — E119 Type 2 diabetes mellitus without complications: Secondary | ICD-10-CM | POA: Diagnosis not present

## 2013-05-23 DIAGNOSIS — L97909 Non-pressure chronic ulcer of unspecified part of unspecified lower leg with unspecified severity: Secondary | ICD-10-CM | POA: Insufficient documentation

## 2013-05-23 DIAGNOSIS — I87319 Chronic venous hypertension (idiopathic) with ulcer of unspecified lower extremity: Secondary | ICD-10-CM | POA: Insufficient documentation

## 2013-05-23 DIAGNOSIS — Z862 Personal history of diseases of the blood and blood-forming organs and certain disorders involving the immune mechanism: Secondary | ICD-10-CM | POA: Insufficient documentation

## 2013-05-23 DIAGNOSIS — E785 Hyperlipidemia, unspecified: Secondary | ICD-10-CM | POA: Insufficient documentation

## 2013-05-23 DIAGNOSIS — I509 Heart failure, unspecified: Secondary | ICD-10-CM | POA: Insufficient documentation

## 2013-05-23 DIAGNOSIS — K219 Gastro-esophageal reflux disease without esophagitis: Secondary | ICD-10-CM | POA: Diagnosis not present

## 2013-05-24 ENCOUNTER — Encounter (HOSPITAL_BASED_OUTPATIENT_CLINIC_OR_DEPARTMENT_OTHER): Payer: Self-pay | Admitting: *Deleted

## 2013-05-24 NOTE — Progress Notes (Signed)
[facility]  NAME:  SEQUAN, AUXIER NO.:  [id number]  MEDICAL RECORD NO.:  [id number]      DATE OF BIRTH:  1941-01-23  PHYSICIAN:  Wayland Denis, DO       VISIT DATE:  05/23/2013                                  OFFICE VISIT   HISTORY OF PRESENT ILLNESS:  The patient is a 72 year old gentleman with chronic venous hypertension ulcers, bilateral lower extremities.  He has been using Santyl on the area with some improvement.  PAST MEDICAL HISTORY:  He has multiple past medical conditions including CHF, gout, hypertension, coronary artery disease, diabetes, renal insufficiency, hyperlipidemia, osteoarthritis, CVA, gastroesophageal reflux disease, and degenerative joint disease.  PAST SURGICAL HISTORY:  He has not had any surgeries.  MEDICATIONS:  Clonidine, Symbicort, atorvastatin, amlodipine, Cymbalta, cyanocobalamin, glipizide, hydralazine, hydrocodone, isosorbide, Klor- Con, labetalol, Nitrostat, warfarin, and fluticasone.  ALLERGIES:  He is not allergic to anything.  SOCIAL HISTORY:  Lives at home with his wife.  REVIEW OF SYSTEMS:  Otherwise negative.  PHYSICAL EXAMINATION:  On exam; he is alert, oriented, and cooperative. He seems very pleasant and eager to get the wounds healed.  He has bilateral venous insufficiency wounds.  He was tender when I debrided them.  His pulses are present, but weak.  ASSESSMENT AND PLAN:  Vascular studies have been done.  Pre-albumin is pending as well as a hemoglobin A1c.  Recommend going to the OR for debridement with ACell and VAC placement.  Elevation in protein. Multivitamin, vitamin C, and zinc.  We will see him in followup.     Wayland Denis, DO     CS/MEDQ  D:  05/23/2013  T:  05/24/2013  Job:  [id number]

## 2013-05-25 ENCOUNTER — Ambulatory Visit (HOSPITAL_COMMUNITY): Payer: Medicare Other | Attending: Cardiovascular Disease

## 2013-05-25 ENCOUNTER — Encounter (HOSPITAL_BASED_OUTPATIENT_CLINIC_OR_DEPARTMENT_OTHER): Payer: Self-pay | Admitting: *Deleted

## 2013-05-25 ENCOUNTER — Ambulatory Visit (INDEPENDENT_AMBULATORY_CARE_PROVIDER_SITE_OTHER): Payer: Medicare Other | Admitting: *Deleted

## 2013-05-25 ENCOUNTER — Encounter: Payer: Self-pay | Admitting: *Deleted

## 2013-05-25 ENCOUNTER — Other Ambulatory Visit (HOSPITAL_COMMUNITY): Payer: Self-pay | Admitting: General Surgery

## 2013-05-25 ENCOUNTER — Other Ambulatory Visit: Payer: Self-pay | Admitting: Cardiology

## 2013-05-25 DIAGNOSIS — Z7901 Long term (current) use of anticoagulants: Secondary | ICD-10-CM | POA: Diagnosis not present

## 2013-05-25 DIAGNOSIS — E119 Type 2 diabetes mellitus without complications: Secondary | ICD-10-CM | POA: Diagnosis not present

## 2013-05-25 DIAGNOSIS — S91009A Unspecified open wound, unspecified ankle, initial encounter: Secondary | ICD-10-CM | POA: Diagnosis not present

## 2013-05-25 DIAGNOSIS — E785 Hyperlipidemia, unspecified: Secondary | ICD-10-CM | POA: Diagnosis not present

## 2013-05-25 DIAGNOSIS — I5021 Acute systolic (congestive) heart failure: Secondary | ICD-10-CM | POA: Insufficient documentation

## 2013-05-25 DIAGNOSIS — I872 Venous insufficiency (chronic) (peripheral): Secondary | ICD-10-CM | POA: Diagnosis not present

## 2013-05-25 DIAGNOSIS — Z9181 History of falling: Secondary | ICD-10-CM | POA: Diagnosis not present

## 2013-05-25 DIAGNOSIS — I6350 Cerebral infarction due to unspecified occlusion or stenosis of unspecified cerebral artery: Secondary | ICD-10-CM

## 2013-05-25 DIAGNOSIS — I10 Essential (primary) hypertension: Secondary | ICD-10-CM | POA: Insufficient documentation

## 2013-05-25 DIAGNOSIS — Z87891 Personal history of nicotine dependence: Secondary | ICD-10-CM | POA: Insufficient documentation

## 2013-05-25 DIAGNOSIS — I5031 Acute diastolic (congestive) heart failure: Secondary | ICD-10-CM

## 2013-05-25 DIAGNOSIS — I509 Heart failure, unspecified: Secondary | ICD-10-CM | POA: Diagnosis not present

## 2013-05-25 LAB — POCT INR: INR: 1.3

## 2013-05-25 NOTE — Progress Notes (Signed)
REVIEWED CHART W/ DR DENNENY MDA, NOTING PT MULTIPLE CARDIAC ISSUES AND EF OF 25%, STATES PT IS  AN ASA IV.  UNLESS WE HAVE A WRITTEN POLICY OF NO ASA IV'S AT OUR FACILITY, PT CAN BE DONE HERE.

## 2013-05-25 NOTE — Progress Notes (Signed)
Echocardiogram performed.  

## 2013-05-25 NOTE — Progress Notes (Addendum)
SPOKE W/ PT WIFE. SHE STATES THAT THEY WERE OF THE UNDERSTANDING PER OTHER MD AT [facility]ARE [facility] ABOUT DOING HYPERBARIC CHAMBER, WHICH PT HAS APPT. NEXT WED. 05-31-2011. AND SHE DID NOT UNDERSTANT WHAT PROCEDURE DR Kelly Splinter WAS DOING AND PT HAD NOT STOPPED HIS COUMADIN. PT IS HAVING ECHO DONE TODAY TO SEE IF PT WILL BE ABLE TO TO HYPERBARIC CHAMBER. SO, PT WIFE STATES WILL NOT BE HAVING PROCEDURE ON Monday. SPOKE W/ DR Kelly Splinter OFFICE AND INFORMED THEM OF THIS. STATES MESSAGE WILL BE GIVEN TO DR Kelly Splinter.   DR Kelly Splinter OFFICE CALLED AND CASE IS CANCELED.

## 2013-05-28 ENCOUNTER — Other Ambulatory Visit: Payer: Self-pay | Admitting: Internal Medicine

## 2013-05-28 ENCOUNTER — Encounter (HOSPITAL_COMMUNITY): Payer: Self-pay | Admitting: General Surgery

## 2013-05-28 ENCOUNTER — Encounter (HOSPITAL_BASED_OUTPATIENT_CLINIC_OR_DEPARTMENT_OTHER): Admission: RE | Payer: Self-pay | Source: Ambulatory Visit

## 2013-05-28 ENCOUNTER — Ambulatory Visit (HOSPITAL_BASED_OUTPATIENT_CLINIC_OR_DEPARTMENT_OTHER): Admission: RE | Admit: 2013-05-28 | Payer: Medicare Other | Source: Ambulatory Visit | Admitting: Plastic Surgery

## 2013-05-28 DIAGNOSIS — Z9181 History of falling: Secondary | ICD-10-CM | POA: Diagnosis not present

## 2013-05-28 DIAGNOSIS — E119 Type 2 diabetes mellitus without complications: Secondary | ICD-10-CM | POA: Diagnosis not present

## 2013-05-28 DIAGNOSIS — S81009A Unspecified open wound, unspecified knee, initial encounter: Secondary | ICD-10-CM | POA: Diagnosis not present

## 2013-05-28 DIAGNOSIS — I509 Heart failure, unspecified: Secondary | ICD-10-CM | POA: Diagnosis not present

## 2013-05-28 DIAGNOSIS — I10 Essential (primary) hypertension: Secondary | ICD-10-CM | POA: Diagnosis not present

## 2013-05-28 DIAGNOSIS — I872 Venous insufficiency (chronic) (peripheral): Secondary | ICD-10-CM | POA: Diagnosis not present

## 2013-05-28 HISTORY — DX: Peripheral vascular disease, unspecified: I73.9

## 2013-05-28 HISTORY — DX: Non-pressure chronic ulcer of unspecified part of left lower leg with unspecified severity: L97.929

## 2013-05-28 HISTORY — DX: Left bundle-branch block, unspecified: I44.7

## 2013-05-28 HISTORY — DX: Non-pressure chronic ulcer of unspecified part of right lower leg with unspecified severity: L97.919

## 2013-05-28 SURGERY — IRRIGATION AND DEBRIDEMENT WOUND
Anesthesia: General | Site: Leg Lower | Laterality: Bilateral

## 2013-05-30 ENCOUNTER — Encounter: Payer: Self-pay | Admitting: Vascular Surgery

## 2013-05-30 DIAGNOSIS — L97909 Non-pressure chronic ulcer of unspecified part of unspecified lower leg with unspecified severity: Secondary | ICD-10-CM | POA: Diagnosis not present

## 2013-05-30 DIAGNOSIS — I2510 Atherosclerotic heart disease of native coronary artery without angina pectoris: Secondary | ICD-10-CM | POA: Diagnosis not present

## 2013-05-30 DIAGNOSIS — E119 Type 2 diabetes mellitus without complications: Secondary | ICD-10-CM | POA: Diagnosis not present

## 2013-05-30 DIAGNOSIS — Z8673 Personal history of transient ischemic attack (TIA), and cerebral infarction without residual deficits: Secondary | ICD-10-CM | POA: Diagnosis not present

## 2013-05-30 DIAGNOSIS — E785 Hyperlipidemia, unspecified: Secondary | ICD-10-CM | POA: Diagnosis not present

## 2013-05-30 DIAGNOSIS — I509 Heart failure, unspecified: Secondary | ICD-10-CM | POA: Diagnosis not present

## 2013-05-30 DIAGNOSIS — I87319 Chronic venous hypertension (idiopathic) with ulcer of unspecified lower extremity: Secondary | ICD-10-CM | POA: Diagnosis not present

## 2013-05-31 ENCOUNTER — Encounter: Payer: Self-pay | Admitting: Vascular Surgery

## 2013-05-31 ENCOUNTER — Ambulatory Visit (INDEPENDENT_AMBULATORY_CARE_PROVIDER_SITE_OTHER): Payer: Medicare Other | Admitting: Vascular Surgery

## 2013-05-31 VITALS — BP 135/85 | HR 65 | Ht 74.5 in | Wt 219.9 lb

## 2013-05-31 DIAGNOSIS — L98499 Non-pressure chronic ulcer of skin of other sites with unspecified severity: Secondary | ICD-10-CM

## 2013-05-31 DIAGNOSIS — I739 Peripheral vascular disease, unspecified: Secondary | ICD-10-CM | POA: Diagnosis not present

## 2013-05-31 NOTE — Progress Notes (Signed)
[facility] [place]  Subjective  - He is doing well over all.  They go to the wound center 1 time per week and a home health nurse comes 2 times per week.    HPI:He was first seen 05/10/2013 in the office secondary to ulcers and PAD with claudication.  ABDOMINAL AORTAGRAM N/A LOWER EXTREMITY ANGIOGRAM 05/18/2013  without intervention.  Dr. Darrick Penna want to do a trial of observation because he essentially has in-line flow via the posterior tibial artery.  In the left lower extremity if the wound is having difficulty healing we could consider angioplasty of the distal and mid posterior tibial artery.      Objective 135/85 65     100%   Left ulcer posterior leg 5 X 7 cm wound with beefy red base and min. Yellow eschar.  Weeping fluid with distal medial malleolus skin maceration.  Right  Posterior lateral wound 3 x 4 cm no weeping yellow eschar. Skin edges clean and dry on the right. Absent DP/PT pulses.   Assessment/Planning:  We would recommend continued una boot on the right with santyl , but a wound vac would be helpful on the left secondary to the skin maceration and weeping. We placed an una boot on the right today and a hydrogel ace wrap on the left LE until the wound VAC can be obtained. Frequent dressings may be necessary on the left leg to prevent skin maceration until we are able to obtain a wound VAC. F/U in 1 month.  The patient will continue followup with Dr. Jimmey Ralph in the meanwhile.  Clinton [facility] 05/31/2013 3:24 PM --  History exam details as above. Both wounds are clean. There is a large amount of serous drainage from the left leg. I believe this would be better controlled with a VAC. Hopefully we can get this approved same. He will followup with me in one month. He will continue to be followed by Dr. Jimmey Ralph in the wound center as well.  Fabienne Bruns, MD [facility] [place] Office: [phone] Pager:  [phone]

## 2013-06-01 ENCOUNTER — Ambulatory Visit (INDEPENDENT_AMBULATORY_CARE_PROVIDER_SITE_OTHER): Payer: Medicare Other

## 2013-06-01 ENCOUNTER — Other Ambulatory Visit: Payer: Self-pay | Admitting: Cardiology

## 2013-06-01 DIAGNOSIS — I10 Essential (primary) hypertension: Secondary | ICD-10-CM | POA: Diagnosis not present

## 2013-06-01 DIAGNOSIS — I509 Heart failure, unspecified: Secondary | ICD-10-CM | POA: Diagnosis not present

## 2013-06-01 DIAGNOSIS — I6350 Cerebral infarction due to unspecified occlusion or stenosis of unspecified cerebral artery: Secondary | ICD-10-CM

## 2013-06-01 DIAGNOSIS — Z9181 History of falling: Secondary | ICD-10-CM | POA: Diagnosis not present

## 2013-06-01 DIAGNOSIS — Z7901 Long term (current) use of anticoagulants: Secondary | ICD-10-CM

## 2013-06-01 DIAGNOSIS — S91009A Unspecified open wound, unspecified ankle, initial encounter: Secondary | ICD-10-CM | POA: Diagnosis not present

## 2013-06-01 DIAGNOSIS — E119 Type 2 diabetes mellitus without complications: Secondary | ICD-10-CM | POA: Diagnosis not present

## 2013-06-01 DIAGNOSIS — I872 Venous insufficiency (chronic) (peripheral): Secondary | ICD-10-CM | POA: Diagnosis not present

## 2013-06-04 ENCOUNTER — Other Ambulatory Visit: Payer: Self-pay | Admitting: Internal Medicine

## 2013-06-04 DIAGNOSIS — Z9181 History of falling: Secondary | ICD-10-CM | POA: Diagnosis not present

## 2013-06-04 DIAGNOSIS — E119 Type 2 diabetes mellitus without complications: Secondary | ICD-10-CM | POA: Diagnosis not present

## 2013-06-04 DIAGNOSIS — I872 Venous insufficiency (chronic) (peripheral): Secondary | ICD-10-CM | POA: Diagnosis not present

## 2013-06-04 DIAGNOSIS — I10 Essential (primary) hypertension: Secondary | ICD-10-CM | POA: Diagnosis not present

## 2013-06-04 DIAGNOSIS — S91009A Unspecified open wound, unspecified ankle, initial encounter: Secondary | ICD-10-CM | POA: Diagnosis not present

## 2013-06-04 DIAGNOSIS — I509 Heart failure, unspecified: Secondary | ICD-10-CM | POA: Diagnosis not present

## 2013-06-06 DIAGNOSIS — E119 Type 2 diabetes mellitus without complications: Secondary | ICD-10-CM | POA: Diagnosis not present

## 2013-06-06 DIAGNOSIS — I509 Heart failure, unspecified: Secondary | ICD-10-CM | POA: Diagnosis not present

## 2013-06-06 DIAGNOSIS — I87319 Chronic venous hypertension (idiopathic) with ulcer of unspecified lower extremity: Secondary | ICD-10-CM | POA: Diagnosis not present

## 2013-06-06 DIAGNOSIS — I2510 Atherosclerotic heart disease of native coronary artery without angina pectoris: Secondary | ICD-10-CM | POA: Diagnosis not present

## 2013-06-07 ENCOUNTER — Encounter: Payer: Medicare Other | Admitting: Vascular Surgery

## 2013-06-08 DIAGNOSIS — I10 Essential (primary) hypertension: Secondary | ICD-10-CM | POA: Diagnosis not present

## 2013-06-08 DIAGNOSIS — S91009A Unspecified open wound, unspecified ankle, initial encounter: Secondary | ICD-10-CM | POA: Diagnosis not present

## 2013-06-08 DIAGNOSIS — E119 Type 2 diabetes mellitus without complications: Secondary | ICD-10-CM | POA: Diagnosis not present

## 2013-06-08 DIAGNOSIS — Z9181 History of falling: Secondary | ICD-10-CM | POA: Diagnosis not present

## 2013-06-08 DIAGNOSIS — I872 Venous insufficiency (chronic) (peripheral): Secondary | ICD-10-CM | POA: Diagnosis not present

## 2013-06-08 DIAGNOSIS — I509 Heart failure, unspecified: Secondary | ICD-10-CM | POA: Diagnosis not present

## 2013-06-08 DIAGNOSIS — S81009A Unspecified open wound, unspecified knee, initial encounter: Secondary | ICD-10-CM | POA: Diagnosis not present

## 2013-06-09 ENCOUNTER — Encounter (HOSPITAL_COMMUNITY): Payer: Self-pay | Admitting: Emergency Medicine

## 2013-06-09 ENCOUNTER — Emergency Department (HOSPITAL_COMMUNITY): Payer: Medicare Other

## 2013-06-09 ENCOUNTER — Inpatient Hospital Stay (HOSPITAL_COMMUNITY): Payer: Medicare Other

## 2013-06-09 ENCOUNTER — Inpatient Hospital Stay (HOSPITAL_COMMUNITY)
Admission: EM | Admit: 2013-06-09 | Discharge: 2013-06-13 | DRG: 308 | Disposition: A | Discharge status: Skilled Nursing Facility | Payer: Medicare Other | Attending: Internal Medicine | Admitting: Internal Medicine

## 2013-06-09 DIAGNOSIS — I130 Hypertensive heart and chronic kidney disease with heart failure and stage 1 through stage 4 chronic kidney disease, or unspecified chronic kidney disease: Secondary | ICD-10-CM | POA: Diagnosis present

## 2013-06-09 DIAGNOSIS — E876 Hypokalemia: Secondary | ICD-10-CM | POA: Diagnosis not present

## 2013-06-09 DIAGNOSIS — F29 Unspecified psychosis not due to a substance or known physiological condition: Secondary | ICD-10-CM | POA: Diagnosis not present

## 2013-06-09 DIAGNOSIS — I5043 Acute on chronic combined systolic (congestive) and diastolic (congestive) heart failure: Secondary | ICD-10-CM

## 2013-06-09 DIAGNOSIS — I4891 Unspecified atrial fibrillation: Secondary | ICD-10-CM

## 2013-06-09 DIAGNOSIS — R55 Syncope and collapse: Secondary | ICD-10-CM

## 2013-06-09 DIAGNOSIS — E1129 Type 2 diabetes mellitus with other diabetic kidney complication: Secondary | ICD-10-CM | POA: Diagnosis not present

## 2013-06-09 DIAGNOSIS — Z792 Long term (current) use of antibiotics: Secondary | ICD-10-CM | POA: Diagnosis not present

## 2013-06-09 DIAGNOSIS — L97909 Non-pressure chronic ulcer of unspecified part of unspecified lower leg with unspecified severity: Secondary | ICD-10-CM | POA: Diagnosis present

## 2013-06-09 DIAGNOSIS — I10 Essential (primary) hypertension: Secondary | ICD-10-CM | POA: Diagnosis not present

## 2013-06-09 DIAGNOSIS — I739 Peripheral vascular disease, unspecified: Secondary | ICD-10-CM

## 2013-06-09 DIAGNOSIS — I509 Heart failure, unspecified: Secondary | ICD-10-CM | POA: Diagnosis not present

## 2013-06-09 DIAGNOSIS — M25572 Pain in left ankle and joints of left foot: Secondary | ICD-10-CM

## 2013-06-09 DIAGNOSIS — M6281 Muscle weakness (generalized): Secondary | ICD-10-CM | POA: Diagnosis not present

## 2013-06-09 DIAGNOSIS — I70219 Atherosclerosis of native arteries of extremities with intermittent claudication, unspecified extremity: Secondary | ICD-10-CM

## 2013-06-09 DIAGNOSIS — R4182 Altered mental status, unspecified: Secondary | ICD-10-CM | POA: Diagnosis not present

## 2013-06-09 DIAGNOSIS — I43 Cardiomyopathy in diseases classified elsewhere: Secondary | ICD-10-CM | POA: Diagnosis not present

## 2013-06-09 DIAGNOSIS — N183 Chronic kidney disease, stage 3 unspecified: Secondary | ICD-10-CM | POA: Diagnosis present

## 2013-06-09 DIAGNOSIS — R42 Dizziness and giddiness: Secondary | ICD-10-CM | POA: Diagnosis not present

## 2013-06-09 DIAGNOSIS — N189 Chronic kidney disease, unspecified: Secondary | ICD-10-CM

## 2013-06-09 DIAGNOSIS — I951 Orthostatic hypotension: Secondary | ICD-10-CM

## 2013-06-09 DIAGNOSIS — R279 Unspecified lack of coordination: Secondary | ICD-10-CM | POA: Diagnosis not present

## 2013-06-09 DIAGNOSIS — I447 Left bundle-branch block, unspecified: Secondary | ICD-10-CM | POA: Diagnosis present

## 2013-06-09 DIAGNOSIS — I129 Hypertensive chronic kidney disease with stage 1 through stage 4 chronic kidney disease, or unspecified chronic kidney disease: Secondary | ICD-10-CM | POA: Diagnosis not present

## 2013-06-09 DIAGNOSIS — R748 Abnormal levels of other serum enzymes: Secondary | ICD-10-CM | POA: Diagnosis present

## 2013-06-09 DIAGNOSIS — M25579 Pain in unspecified ankle and joints of unspecified foot: Secondary | ICD-10-CM | POA: Diagnosis not present

## 2013-06-09 DIAGNOSIS — Z8673 Personal history of transient ischemic attack (TIA), and cerebral infarction without residual deficits: Secondary | ICD-10-CM | POA: Diagnosis not present

## 2013-06-09 DIAGNOSIS — Z7901 Long term (current) use of anticoagulants: Secondary | ICD-10-CM | POA: Diagnosis not present

## 2013-06-09 DIAGNOSIS — S81809A Unspecified open wound, unspecified lower leg, initial encounter: Secondary | ICD-10-CM

## 2013-06-09 DIAGNOSIS — E785 Hyperlipidemia, unspecified: Secondary | ICD-10-CM | POA: Diagnosis present

## 2013-06-09 DIAGNOSIS — I6350 Cerebral infarction due to unspecified occlusion or stenosis of unspecified cerebral artery: Secondary | ICD-10-CM | POA: Diagnosis present

## 2013-06-09 DIAGNOSIS — L98499 Non-pressure chronic ulcer of skin of other sites with unspecified severity: Secondary | ICD-10-CM | POA: Diagnosis present

## 2013-06-09 DIAGNOSIS — Z79899 Other long term (current) drug therapy: Secondary | ICD-10-CM | POA: Diagnosis not present

## 2013-06-09 DIAGNOSIS — Z87891 Personal history of nicotine dependence: Secondary | ICD-10-CM

## 2013-06-09 DIAGNOSIS — R404 Transient alteration of awareness: Secondary | ICD-10-CM | POA: Diagnosis not present

## 2013-06-09 DIAGNOSIS — Z794 Long term (current) use of insulin: Secondary | ICD-10-CM | POA: Diagnosis not present

## 2013-06-09 DIAGNOSIS — Z5189 Encounter for other specified aftercare: Secondary | ICD-10-CM | POA: Diagnosis not present

## 2013-06-09 DIAGNOSIS — R269 Unspecified abnormalities of gait and mobility: Secondary | ICD-10-CM | POA: Diagnosis not present

## 2013-06-09 DIAGNOSIS — R7989 Other specified abnormal findings of blood chemistry: Secondary | ICD-10-CM

## 2013-06-09 LAB — URINE MICROSCOPIC-ADD ON

## 2013-06-09 LAB — GLUCOSE, CAPILLARY: Glucose-Capillary: 120 mg/dL — ABNORMAL HIGH (ref 70–99)

## 2013-06-09 LAB — CBC WITH DIFFERENTIAL/PLATELET
Hemoglobin: 10.4 g/dL — ABNORMAL LOW (ref 13.0–17.0)
Lymphocytes Relative: 12 % (ref 12–46)
Lymphs Abs: 1.1 10*3/uL (ref 0.7–4.0)
MCV: 77.8 fL — ABNORMAL LOW (ref 78.0–100.0)
Monocytes Relative: 6 % (ref 3–12)
Neutrophils Relative %: 81 % — ABNORMAL HIGH (ref 43–77)
Platelets: 234 10*3/uL (ref 150–400)
RBC: 4.19 MIL/uL — ABNORMAL LOW (ref 4.22–5.81)
WBC: 9.1 10*3/uL (ref 4.0–10.5)

## 2013-06-09 LAB — TROPONIN I: Troponin I: <0.3 ng/mL (ref <0.30)

## 2013-06-09 LAB — URINALYSIS, ROUTINE W REFLEX MICROSCOPIC
Leukocytes, UA: NEGATIVE
Nitrite: NEGATIVE
Specific Gravity, Urine: 1.01 (ref 1.005–1.030)
Urobilinogen, UA: 0.2 mg/dL (ref 0.0–1.0)

## 2013-06-09 LAB — BASIC METABOLIC PANEL
BUN: 21 mg/dL (ref 6–23)
CO2: 31 mEq/L (ref 19–32)
Glucose, Bld: 75 mg/dL (ref 70–99)
Potassium: 2.8 mEq/L — ABNORMAL LOW (ref 3.5–5.1)
Sodium: 146 mEq/L — ABNORMAL HIGH (ref 135–145)

## 2013-06-09 LAB — PROTIME-INR
INR: 2.69 — ABNORMAL HIGH (ref 0.00–1.49)
Prothrombin Time: 27.3 seconds — ABNORMAL HIGH (ref 11.6–15.2)

## 2013-06-09 LAB — MAGNESIUM: Magnesium: 1.5 mg/dL (ref 1.5–2.5)

## 2013-06-09 MED ORDER — POTASSIUM CHLORIDE CRYS ER 20 MEQ PO TBCR
40.0000 meq | EXTENDED_RELEASE_TABLET | Freq: Two times a day (BID) | ORAL | Status: DC
  Administered 2013-06-09 – 2013-06-13 (×8): 40 meq via ORAL
  Filled 2013-06-09 (×12): qty 2

## 2013-06-09 MED ORDER — ATORVASTATIN CALCIUM 20 MG PO TABS
20.0000 mg | ORAL_TABLET | Freq: Every day | ORAL | Status: DC
  Administered 2013-06-09 – 2013-06-13 (×5): 20 mg via ORAL
  Filled 2013-06-09 (×6): qty 1

## 2013-06-09 MED ORDER — WARFARIN SODIUM 5 MG PO TABS
5.0000 mg | ORAL_TABLET | ORAL | Status: DC
  Filled 2013-06-09: qty 1

## 2013-06-09 MED ORDER — POTASSIUM CHLORIDE CRYS ER 20 MEQ PO TBCR
40.0000 meq | EXTENDED_RELEASE_TABLET | Freq: Once | ORAL | Status: AC
  Administered 2013-06-09: 40 meq via ORAL
  Filled 2013-06-09: qty 2

## 2013-06-09 MED ORDER — TORSEMIDE 20 MG PO TABS
40.0000 mg | ORAL_TABLET | Freq: Every day | ORAL | Status: DC
  Filled 2013-06-09: qty 2

## 2013-06-09 MED ORDER — SODIUM CHLORIDE 0.9 % IJ SOLN
3.0000 mL | Freq: Two times a day (BID) | INTRAMUSCULAR | Status: DC
Start: 2013-06-09 — End: 2013-06-13
  Administered 2013-06-10 – 2013-06-13 (×5): 3 mL via INTRAVENOUS

## 2013-06-09 MED ORDER — ACETAMINOPHEN 325 MG PO TABS
650.0000 mg | ORAL_TABLET | Freq: Four times a day (QID) | ORAL | Status: DC | PRN
  Administered 2013-06-13: 650 mg via ORAL
  Filled 2013-06-09: qty 2

## 2013-06-09 MED ORDER — VITAMIN D3 25 MCG (1000 UNIT) PO TABS
1000.0000 [IU] | ORAL_TABLET | Freq: Every day | ORAL | Status: DC
  Administered 2013-06-10 – 2013-06-13 (×4): 1000 [IU] via ORAL
  Filled 2013-06-09 (×5): qty 1

## 2013-06-09 MED ORDER — METOPROLOL TARTRATE 1 MG/ML IV SOLN
INTRAVENOUS | Status: AC
  Administered 2013-06-09: 5 mg via INTRAVENOUS
  Filled 2013-06-09: qty 5

## 2013-06-09 MED ORDER — SODIUM CHLORIDE 0.9 % IV SOLN
INTRAVENOUS | Status: AC
  Administered 2013-06-09: 50 mL/h via INTRAVENOUS

## 2013-06-09 MED ORDER — SODIUM CHLORIDE 0.9 % IV BOLUS (SEPSIS)
500.0000 mL | Freq: Once | INTRAVENOUS | Status: AC
  Administered 2013-06-09: 500 mL via INTRAVENOUS

## 2013-06-09 MED ORDER — TORSEMIDE 20 MG PO TABS
50.0000 mg | ORAL_TABLET | Freq: Every day | ORAL | Status: DC
  Administered 2013-06-10 – 2013-06-12 (×3): 50 mg via ORAL
  Filled 2013-06-09 (×5): qty 1

## 2013-06-09 MED ORDER — OXYCODONE HCL 5 MG PO TABS
5.0000 mg | ORAL_TABLET | ORAL | Status: DC | PRN
  Administered 2013-06-11 – 2013-06-13 (×2): 5 mg via ORAL
  Filled 2013-06-09 (×2): qty 1

## 2013-06-09 MED ORDER — INSULIN ASPART 100 UNIT/ML ~~LOC~~ SOLN
0.0000 [IU] | Freq: Three times a day (TID) | SUBCUTANEOUS | Status: DC
  Administered 2013-06-10: 1 [IU] via SUBCUTANEOUS
  Administered 2013-06-10 – 2013-06-11 (×2): 2 [IU] via SUBCUTANEOUS
  Administered 2013-06-12 – 2013-06-13 (×2): 1 [IU] via SUBCUTANEOUS

## 2013-06-09 MED ORDER — DOXYCYCLINE HYCLATE 100 MG PO TABS
100.0000 mg | ORAL_TABLET | Freq: Two times a day (BID) | ORAL | Status: DC
  Administered 2013-06-09 – 2013-06-13 (×8): 100 mg via ORAL
  Filled 2013-06-09 (×10): qty 1

## 2013-06-09 MED ORDER — SODIUM CHLORIDE 0.9 % IJ SOLN: 3.0000 mL | INTRAMUSCULAR | Status: DC | PRN

## 2013-06-09 MED ORDER — CHOLECALCIFEROL 25 MCG (1000 UT) PO TABS: 1000.0000 [IU] | ORAL_TABLET | Freq: Every day | ORAL | Status: DC

## 2013-06-09 MED ORDER — ISOSORBIDE MONONITRATE ER 60 MG PO TB24
60.0000 mg | ORAL_TABLET | Freq: Every day | ORAL | Status: DC
  Administered 2013-06-09 – 2013-06-13 (×5): 60 mg via ORAL
  Filled 2013-06-09 (×6): qty 1

## 2013-06-09 MED ORDER — ONDANSETRON HCL 4 MG PO TABS: 4.0000 mg | ORAL_TABLET | Freq: Four times a day (QID) | ORAL | Status: DC | PRN

## 2013-06-09 MED ORDER — VITAMIN B-12 1000 MCG PO TABS
1000.0000 ug | ORAL_TABLET | Freq: Every day | ORAL | Status: DC
Start: 2013-06-09 — End: 2013-06-09

## 2013-06-09 MED ORDER — SENNOSIDES-DOCUSATE SODIUM 8.6-50 MG PO TABS
1.0000 | ORAL_TABLET | Freq: Every evening | ORAL | Status: DC | PRN
  Filled 2013-06-09: qty 1

## 2013-06-09 MED ORDER — METOPROLOL TARTRATE 1 MG/ML IV SOLN: 5.0000 mg | Freq: Once | INTRAVENOUS | Status: AC

## 2013-06-09 MED ORDER — SODIUM CHLORIDE 0.9 % IJ SOLN
3.0000 mL | Freq: Two times a day (BID) | INTRAMUSCULAR | Status: DC
  Administered 2013-06-10 – 2013-06-13 (×3): 3 mL via INTRAVENOUS

## 2013-06-09 MED ORDER — SODIUM CHLORIDE 0.9 % IV SOLN: 250.0000 mL | INTRAVENOUS | Status: DC | PRN

## 2013-06-09 MED ORDER — WARFARIN SODIUM 7.5 MG PO TABS: 7.5000 mg | ORAL_TABLET | ORAL | Status: DC

## 2013-06-09 MED ORDER — VITAMIN B-12 1000 MCG PO TABS
1000.0000 ug | ORAL_TABLET | Freq: Every day | ORAL | Status: DC
  Administered 2013-06-10 – 2013-06-13 (×4): 1000 ug via ORAL
  Filled 2013-06-09 (×5): qty 1

## 2013-06-09 MED ORDER — DOXYCYCLINE HYCLATE 100 MG PO TBEC: 100.0000 mg | DELAYED_RELEASE_TABLET | Freq: Two times a day (BID) | ORAL | Status: DC

## 2013-06-09 MED ORDER — FLUTICASONE FUROATE-VILANTEROL 100-25 MCG/INH IN AEPB: 1.0000 | INHALATION_SPRAY | Freq: Every day | RESPIRATORY_TRACT | Status: DC | PRN

## 2013-06-09 MED ORDER — WARFARIN - PHARMACIST DOSING INPATIENT
Freq: Every day | Status: DC
  Administered 2013-06-10 – 2013-06-12 (×2)

## 2013-06-09 MED ORDER — POTASSIUM CHLORIDE CRYS ER 20 MEQ PO TBCR
20.0000 meq | EXTENDED_RELEASE_TABLET | Freq: Once | ORAL | Status: AC
  Administered 2013-06-09: 20 meq via ORAL
  Filled 2013-06-09: qty 1

## 2013-06-09 MED ORDER — GLIPIZIDE 2.5 MG HALF TABLET
2.5000 mg | ORAL_TABLET | Freq: Two times a day (BID) | ORAL | Status: DC
  Administered 2013-06-10 – 2013-06-13 (×7): 2.5 mg via ORAL
  Filled 2013-06-09 (×9): qty 1

## 2013-06-09 MED ORDER — INSULIN ASPART 100 UNIT/ML ~~LOC~~ SOLN
3.0000 [IU] | Freq: Three times a day (TID) | SUBCUTANEOUS | Status: DC
  Administered 2013-06-10 (×3): 3 [IU] via SUBCUTANEOUS

## 2013-06-09 MED ORDER — NITROGLYCERIN 0.4 MG SL SUBL: 0.4000 mg | SUBLINGUAL_TABLET | SUBLINGUAL | Status: DC | PRN

## 2013-06-09 MED ORDER — HYDRALAZINE HCL 50 MG PO TABS
75.0000 mg | ORAL_TABLET | Freq: Two times a day (BID) | ORAL | Status: DC
  Administered 2013-06-09: 75 mg via ORAL
  Filled 2013-06-09: qty 1

## 2013-06-09 MED ORDER — GI COCKTAIL ~~LOC~~
30.0000 mL | Freq: Once | ORAL | Status: AC
  Administered 2013-06-09: 30 mL via ORAL
  Filled 2013-06-09: qty 30

## 2013-06-09 MED ORDER — ONDANSETRON HCL 4 MG/2ML IJ SOLN
4.0000 mg | Freq: Four times a day (QID) | INTRAMUSCULAR | Status: DC | PRN
  Administered 2013-06-11: 4 mg via INTRAVENOUS
  Filled 2013-06-09: qty 2

## 2013-06-09 MED ORDER — LABETALOL HCL 100 MG PO TABS
100.0000 mg | ORAL_TABLET | Freq: Two times a day (BID) | ORAL | Status: DC
  Administered 2013-06-09 – 2013-06-13 (×8): 100 mg via ORAL
  Filled 2013-06-09 (×10): qty 1

## 2013-06-09 MED ORDER — ACETAMINOPHEN 650 MG RE SUPP: 650.0000 mg | Freq: Four times a day (QID) | RECTAL | Status: DC | PRN

## 2013-06-09 MED ORDER — WARFARIN SODIUM 5 MG PO TABS
5.0000 mg | ORAL_TABLET | ORAL | Status: DC
  Administered 2013-06-09 – 2013-06-10 (×2): 5 mg via ORAL
  Filled 2013-06-09 (×3): qty 1

## 2013-06-09 MED ORDER — AMLODIPINE BESYLATE 5 MG PO TABS
5.0000 mg | ORAL_TABLET | Freq: Every day | ORAL | Status: DC
  Administered 2013-06-09 – 2013-06-13 (×5): 5 mg via ORAL
  Filled 2013-06-09 (×6): qty 1

## 2013-06-09 NOTE — ED Notes (Signed)
Pt has a urinal and knows we need a urine sample from him 

## 2013-06-09 NOTE — ED Notes (Signed)
Pt arrived by [facility] with c/o near syncopal episode. Pt was at home talking to his wife in a different room while walking down the hall and turned around too quick causing him to become dizzy and he helped himself to the ground. Pt stated that it felt like he was going to pass out but when he sat down he felt better. Wife stated to EMS that he has been having some confusion lately and has been more forgetful. BP:130/80 HR:75

## 2013-06-09 NOTE — H&P (Signed)
[facility]          History and Physical    PCP:   [name], MD   Chief Complaint:  Almost passed out  HPI: Patient is a 72 year old African American man who presents to the hospital today after a near syncopal event. Patient states he woke up this morning, at some point stood up walked a few steps and felt very dizzy as if he were going to pass out. He managed to back up into a wall and slid himself down to the floor. Wife was present and and witnessed the event. He has multiple comorbid conditions including nonischemic cardiomyopathy with an ejection fraction of 20-25% per recent echo earlier this month, peripheral vascular disease, nonobstructive coronary artery disease per cath in April 2012, bilateral lower extremity ulcers currently receiving care at the wound center, diabetes, hypertension, hyperlipidemia, prior history of CVA, chronic kidney disease stage III. He was brought into the hospital by EMS. Workup in the emergency department shows a potassium of 2.8, troponin of 0.3, we have been asked to admit him for further evaluation and management.  Allergies:  No Known Allergies    Past Medical History  Diagnosis Date  . Gout   . HTN (hypertension)   . Renal insufficiency   . Hyperlipidemia   . Osteoarthritis   . History of CVA (cerebrovascular accident)     Left pontine infarct July 2004; changed from Plavix to Coumadin in 2004 per MD at [facility]  . GERD (gastroesophageal reflux disease)   . DJD (degenerative joint disease)   . BPH (benign prostatic hypertrophy)   . Cardiomyopathy, hypertensive     NON-ISCHEMIC  . CAD (coronary artery disease) CARDIOLOGIST-  DR HOCHREIN    Non obstructive  . DM (diabetes mellitus), type 2   . Shortness of breath   . Peripheral vascular disease   . LBBB (left bundle branch block)   . Bilateral leg ulcer     ACHILLES AREA--  NONHEALING  . Left ventricular diastolic dysfunction, NYHA class 3   . Cardiac LV  ejection fraction 21-40%     25-30%  PER ECHO 1/14  . Systolic and diastolic CHF, acute on chronic     EF 25% PER ECHO 1/14    Past Surgical History  Procedure Laterality Date  . Cardiac catheterization  04-16-2011   DR [facility]    NON-OBSTRUCTIVE CAD. MILDLY ELEVATED PULMONARY PRESSURES/ ELEVATED  END-DIASTOLIC PRESSURE  . Transthoracic echocardiogram  12-31-2010    MODERATE CONCENTRIC LVH/ SYSTOLIC FUNCTION SEVERELY REDUCED/ EF 25-30%/  SEVERE HYPOKINESIS OF ANTEROSEPTAL MYOCARDIUM  AND ENTIREAPICAL MYOCARDIUM /  MODERATE HYPOKINESIS OF LATERAL, INFEROLATERAL, INFERIOR,AND INFEROSEPTAL MYOCARIUM/  GRADE 3 DIASTOLIC DYSFUNCTION/ MILD MR  . Aortogram w/ bilateral lower extremitiy runoff  05-18-2013  DR FIELDS    LEFT LEG OCCLUDED PERONEAL AND ANTERIOR TIBIAL ARTERIES/ HIGH GRADE STENOIS 80% MIDDLE AND DISTAL THIRD OF POSTERIOR TIBIAL ARTERY/ RIGHT PERONEAL AND ANTERIOR TIBIAL ARTERY OCCLUDED/ 40% STENOSIS DISTALLY     Prior to Admission medications   Medication Sig Start Date End Date Taking? Authorizing Provider  amLODipine (NORVASC) 10 MG tablet Take 1 tablet (10 mg total) by mouth daily. 05/04/12  Yes Rollene Rotunda, MD  atorvastatin (LIPITOR) 20 MG tablet Take 20 mg by mouth daily. For cholesterol   Yes Historical Provider, MD  budesonide-formoterol (SYMBICORT) 160-4.5 MCG/ACT inhaler Inhale 2 puffs into the lungs daily as needed (for breathing problems).  04/13/11  Yes Tresa Garter, MD  Cholecalciferol 1000 UNITS tablet  Take 1,000 Units by mouth daily.     Yes Historical Provider, MD  cloNIDine (CATAPRES) 0.2 MG tablet TAKE 1 TABLET (0.2 MG TOTAL) BY MOUTH 3 (THREE) TIMES DAILY. 06/01/13  Yes Rollene Rotunda, MD  collagenase (SANTYL) ointment Apply 1 application topically 2 (two) times a week.   Yes Historical Provider, MD  cyanocobalamin 1000 MCG tablet Take 1 tablet (1,000 mcg total) by mouth daily. 01/02/13  Yes Georgina Quint Plotnikov, MD  doxycycline (DORYX) 100 MG EC tablet Take 100  mg by mouth 2 (two) times daily.   Yes Historical Provider, MD  Fluticasone Furoate-Vilanterol (BREO ELLIPTA) 100-25 MCG/INH AEPB Inhale 1 puff into the lungs daily as needed (for breathing problems).   Yes Historical Provider, MD  glipiZIDE (GLUCOTROL) 5 MG tablet Take 2.5 mg by mouth 2 (two) times daily before a meal.   Yes Historical Provider, MD  hydrALAZINE (APRESOLINE) 50 MG tablet Take 75 mg by mouth 3 (three) times daily.   Yes Historical Provider, MD  HYDROcodone-acetaminophen (NORCO/VICODIN) 5-325 MG per tablet Take 1 tablet by mouth every 6 (six) hours as needed for pain.   Yes Historical Provider, MD  isosorbide mononitrate (IMDUR) 120 MG 24 hr tablet Take 120 mg by mouth every morning.   Yes Historical Provider, MD  labetalol (NORMODYNE) 200 MG tablet Take 200 mg by mouth 2 (two) times daily.   Yes Historical Provider, MD  Menthol (HALLS COUGH DROPS MT) Use as directed 1 lozenge in the mouth or throat daily as needed (for cough).   Yes Historical Provider, MD  NITROSTAT 0.4 MG SL tablet Place 0.4 mg under the tongue every 5 (five) minutes as needed for chest pain.  04/20/11  Yes Historical Provider, MD  potassium chloride SA (K-DUR,KLOR-CON) 20 MEQ tablet Take 40 mEq by mouth 2 (two) times daily.   Yes Historical Provider, MD  torsemide (DEMADEX) 100 MG tablet Take 0.5 tablets (50 mg total) by mouth daily. 01/19/13  Yes Rollene Rotunda, MD  triamcinolone cream (KENALOG) 0.5 % Apply 1 application topically 2 (two) times daily as needed (for skin breakdown).   Yes Historical Provider, MD  warfarin (COUMADIN) 5 MG tablet Take 5-7.5 mg by mouth daily. Takes 5 mg daily, except tues and fri's-takes 7.5mg .   Yes Historical Provider, MD    Social History:  reports that he quit smoking about 39 years ago. He has never used smokeless tobacco. He reports that he does not drink alcohol or use illicit drugs.  Family History  Problem Relation Age of Onset  . Hypertension Mother   . Heart disease  Mother   . Diabetes Mother   . Gout Other   . Stroke Other   . Hypertension Father   . Heart disease Father     Review of Systems:  Constitutional: Denies fever, chills, diaphoresis, appetite change and fatigue.  HEENT: Denies photophobia, eye pain, redness, hearing loss, ear pain, congestion, sore throat, rhinorrhea, sneezing, mouth sores, trouble swallowing, neck pain, neck stiffness and tinnitus.   Respiratory: Denies SOB, DOE, cough, chest tightness,  and wheezing.   Cardiovascular: Denies chest pain, palpitations and leg swelling.  Gastrointestinal: Denies nausea, vomiting, abdominal pain, diarrhea, constipation, blood in stool and abdominal distention.  Genitourinary: Denies dysuria, urgency, frequency, hematuria, flank pain and difficulty urinating.  Endocrine: Denies: hot or cold intolerance, sweats, changes in hair or nails, polyuria, polydipsia. Musculoskeletal: Denies myalgias, back pain, joint swelling, arthralgias and gait problem.  Skin: Denies pallor, rash and wound.  Neurological: Denies dizziness,  seizures, syncope, weakness, light-headedness, numbness and headaches.  Hematological: Denies adenopathy. Easy bruising, personal or family bleeding history  Psychiatric/Behavioral: Denies suicidal ideation, mood changes, confusion, nervousness, sleep disturbance and agitation   Physical Exam: Blood pressure 154/81, pulse 70, temperature 98.6 F (37 C), temperature source Oral, resp. rate 19, SpO2 98.00%. General: Alert, awake, oriented x3, in no distress. HEENT: Normocephalic, atraumatic, pupils equal round and reactive to light, extraocular movements intact, wears corrective lenses. Cardiovascular: Regular rate and rhythm. Lungs: Clear to auscultation bilaterally. Abdomen: Soft, nontender, nondistended, positive bowel sounds, no masses or organomegaly noted. Extremities: Unnaboots of bilateral lower extremities, have not taken off, onychomycosis. Neurologic: Grossly intact  and nonfocal  Labs on Admission:  Results for orders placed during the hospital encounter of 06/09/13 (from the past 48 hour(s))  CBC WITH DIFFERENTIAL     Status: Abnormal   Collection Time    06/09/13 12:43 PM      Result Value Range   WBC 9.1  4.0 - 10.5 K/uL   RBC 4.19 (*) 4.22 - 5.81 MIL/uL   Hemoglobin 10.4 (*) 13.0 - 17.0 g/dL   HCT 82.9 (*) 56.2 - 13.0 %   MCV 77.8 (*) 78.0 - 100.0 fL   MCH 24.8 (*) 26.0 - 34.0 pg   MCHC 31.9  30.0 - 36.0 g/dL   RDW 86.5 (*) 78.4 - 69.6 %   Platelets 234  150 - 400 K/uL   Neutrophils Relative % 81 (*) 43 - 77 %   Neutro Abs 7.4  1.7 - 7.7 K/uL   Lymphocytes Relative 12  12 - 46 %   Lymphs Abs 1.1  0.7 - 4.0 K/uL   Monocytes Relative 6  3 - 12 %   Monocytes Absolute 0.6  0.1 - 1.0 K/uL   Eosinophils Relative 0  0 - 5 %   Eosinophils Absolute 0.0  0.0 - 0.7 K/uL   Basophils Relative 0  0 - 1 %   Basophils Absolute 0.0  0.0 - 0.1 K/uL  BASIC METABOLIC PANEL     Status: Abnormal   Collection Time    06/09/13 12:43 PM      Result Value Range   Sodium 146 (*) 135 - 145 mEq/L   Potassium 2.8 (*) 3.5 - 5.1 mEq/L   Chloride 105  96 - 112 mEq/L   CO2 31  19 - 32 mEq/L   Glucose, Bld 75  70 - 99 mg/dL   BUN 21  6 - 23 mg/dL   Creatinine, Ser 2.95 (*) 0.50 - 1.35 mg/dL   Calcium 9.0  8.4 - 28.4 mg/dL   GFR calc non Af Amer 34 (*) >90 mL/min   GFR calc Af Amer 40 (*) >90 mL/min   Comment:            The eGFR has been calculated     using the CKD EPI equation.     This calculation has not been     validated in all clinical     situations.     eGFR's persistently     <90 mL/min signify     possible Chronic Kidney Disease.  TROPONIN I     Status: Abnormal   Collection Time    06/09/13 12:43 PM      Result Value Range   Troponin I 0.32 (*) <0.30 ng/mL   Comment:            Due to the release kinetics of cTnI,  a negative result within the first hours     of the onset of symptoms does not rule out     myocardial infarction with  certainty.     If myocardial infarction is still suspected,     repeat the test at appropriate intervals.     CRITICAL RESULT CALLED TO, READ BACK BY AND VERIFIED WITH:     SIMMONS,N RN 06/09/13 1520 WOOTEN,K  PROTIME-INR     Status: Abnormal   Collection Time    06/09/13 12:43 PM      Result Value Range   Prothrombin Time 27.3 (*) 11.6 - 15.2 seconds   INR 2.69 (*) 0.00 - 1.49  URINALYSIS, ROUTINE W REFLEX MICROSCOPIC     Status: Abnormal   Collection Time    06/09/13  2:22 PM      Result Value Range   Color, Urine YELLOW  YELLOW   APPearance CLEAR  CLEAR   Specific Gravity, Urine 1.010  1.005 - 1.030   pH 6.0  5.0 - 8.0   Glucose, UA NEGATIVE  NEGATIVE mg/dL   Hgb urine dipstick NEGATIVE  NEGATIVE   Bilirubin Urine NEGATIVE  NEGATIVE   Ketones, ur NEGATIVE  NEGATIVE mg/dL   Protein, ur 30 (*) NEGATIVE mg/dL   Urobilinogen, UA 0.2  0.0 - 1.0 mg/dL   Nitrite NEGATIVE  NEGATIVE   Leukocytes, UA NEGATIVE  NEGATIVE  URINE MICROSCOPIC-ADD ON     Status: None   Collection Time    06/09/13  2:22 PM      Result Value Range   Squamous Epithelial / LPF RARE  RARE   WBC, UA 0-2  <3 WBC/hpf   RBC / HPF 0-2  <3 RBC/hpf    Radiological Exams on Admission: Ct Head Wo Contrast  06/09/2013   *RADIOLOGY REPORT*  Clinical Data: Dizziness and confusion.  Anticoagulated.  CT HEAD WITHOUT CONTRAST  Technique:  Contiguous axial images were obtained from the base of the skull through the vertex without contrast.  Comparison: None.  Findings: There is diffuse patchy low density throughout the subcortical and periventricular white matter consistent with chronic small vessel ischemic change.  Chronic right thalamic lacunar infarct noted.  There is prominence of the sulci and ventricles consistent with brain atrophy.  There is no evidence for acute brain infarct, hemorrhage or mass.  The paranasal sinuses and mastoid air cells are clear.  The skull is intact.  IMPRESSION:  1.  No acute intracranial  abnormalities. 2.  Small vessel ischemic change and brain atrophy.   Original Report Authenticated By: Signa Kell, M.D.    Assessment/Plan Principal Problem:   Near syncope Active Problems:   Orthostatic hypotension   Hypokalemia   DM (diabetes mellitus), type 2 with renal complications   HYPERLIPIDEMIA   HYPERTENSION   Acute on chronic combined systolic and diastolic congestive heart failure   CVA   CKD (chronic kidney disease) stage 3, GFR 30-59 ml/min   Wound, open, leg   Atherosclerotic peripheral vascular disease with intermittent claudication   Elevated troponin   Near-syncope -Presumed related to orthostatic hypotension. -Please see below for details.  Orthostatic hypotension -His blood pressure goes from 140 lying to 90 standing. -Has received 500 cc of IV fluid already. -Will DC clonidine, will decrease doses of his home BP medications: We'll decrease Imdur from 120-60 daily, will decrease hydralazine from 75 3 times a day to twice a day, will decrease labetalol from 200 twice a day to 100 twice a day  and we'll decrease Norvasc from 10-5 mg daily. We'll also decrease his Demadex from 50-40 mg daily. -Do not want to give him any further fluids at the moment given his depressed ejection fraction.  Hypokalemia -Potassium is 2.8 on admission. -Will replete orally, check magnesium level. -Suspect related to diuresis. -As above will decrease Demadex dose.  Diabetes mellitus -Check hemoglobin A1c. -Continue glipizide. -Sensitive sliding scale while in the hospital.  Chronic combined CHF -Appears compensated at this moment.  Chronic kidney disease stage III -Creatinine at baseline.  Chronic bilateral lower extremity wounds with peripheral vascular disease -Is followed chronically by the wound care clinic as well as Dr. Darrick Penna with vascular surgery. -He currently has unnaboots on that were placed yesterday. The wife describes that he gets these placed 3 times a  week on Monday Wednesdays and Fridays. -Is chronically on doxycycline which we will continue in the hospital. -Have seen no need to remove the Unna boots as the wounds are chronic in nature.  Elevated troponin -Do not see any acute changes on his EKG. -We'll continue to cycle troponins. -He is not complaining of chest pain or any other symptoms that could be attributed to an anginal equivalent. -Is mildly elevated troponins could certainly be secondary to his CHF for his chronic kidney disease. -Nonetheless given his complicated cardiac history, I have requested cardiology consultation. Dr. Eden Emms will see patient.  History of CVA -Is maintained on anticoagulation with Coumadin. -INR is therapeutic, pharmacy to dose.  DVT prophylaxis -Fully anticoagulated on Coumadin.  Time Spent on Admission: 80 minutes  HERNANDEZ ACOSTA,ESTELA [facility] Pager: [phone] 06/09/2013, 4:42 PM

## 2013-06-09 NOTE — Progress Notes (Signed)
ANTICOAGULATION CONSULT NOTE - Initial Consult  Pharmacy Consult for Coumadin Indication: Hx of stroke  No Known Allergies  Patient Measurements: Height: 6\' 2"  (188 cm) Weight: 203 lb 0.7 oz (92.1 kg) IBW/kg (Calculated) : 82.2  Vital Signs: Temp: 98.1 F (36.7 C) (06/21 1832) Temp src: Oral (06/21 1832) BP: 158/107 mmHg (06/21 1832) Pulse Rate: 82 (06/21 1832)  Labs:  Recent Labs  06/09/13 1243  HGB 10.4*  HCT 32.6*  PLT 234  LABPROT 27.3*  INR 2.69*  CREATININE 1.85*  TROPONINI 0.32*    Estimated Creatinine Clearance: 40.7 ml/min (by C-G formula based on Cr of 1.85).   Medical History: Past Medical History  Diagnosis Date  . Gout   . HTN (hypertension)   . Renal insufficiency   . Hyperlipidemia   . Osteoarthritis   . History of CVA (cerebrovascular accident)     Left pontine infarct July 2004; changed from Plavix to Coumadin in 2004 per MD at [facility]  . GERD (gastroesophageal reflux disease)   . DJD (degenerative joint disease)   . BPH (benign prostatic hypertrophy)   . Cardiomyopathy, hypertensive     NON-ISCHEMIC  . CAD (coronary artery disease) CARDIOLOGIST-  DR HOCHREIN    Non obstructive  . DM (diabetes mellitus), type 2   . Shortness of breath   . Peripheral vascular disease   . LBBB (left bundle branch block)   . Bilateral leg ulcer     ACHILLES AREA--  NONHEALING  . Left ventricular diastolic dysfunction, NYHA class 3   . Cardiac LV ejection fraction 21-40%     25-30%  PER ECHO 1/14  . Systolic and diastolic CHF, acute on chronic     EF 25% PER ECHO 1/14     Assessment: 61 YOM admitted for a near syncopal event. Pt. Is on chronic coumadin for hx of stroke, INR 2.69, hgb 10.4, plt 234. Per patient's wife, he has not taken today's dose yet. Head CT no acute intracranial abnormalities.   PTA dose: 5mg  daily except for 7.5 mg on Tuesday and Friday  Goal of Therapy:  INR 2-3 Monitor platelets by anticoagulation protocol: Yes   Plan:   - Coumadin 5 mg po x 1 tonight - Then continue home dose 5mg  daily except for 7.5 mg on Tue, Fri - F/u daily PT/INR  Riki Rusk 06/09/2013,8:00 PM

## 2013-06-09 NOTE — Consult Note (Signed)
CARDIOLOGY CONSULT NOTE   Patient ID: Frank Mclean MRN: [id number] DOB/AGE: 28-Dec-1940 72 y.o.  Admit date: 06/09/2013  Primary Physician   [name], MD Primary Cardiologist   [name] Reason for Consultation   Elevated troponin  MVH:QIONG [name] is a 72 y.o. male with no history of CAD. He has a history of NICM. He came in with presyncope, had an elevated troponin, and cardiology was asked to evaluate him.  Pt weighs himself daily. Yesterday, his weight was up and his wife gave him Lasix 20 mg. Today his weight was back down. He was having problems this am with confusion and possible hallucinations. He was walking down the hallway and had near-syncope with a near-fall. He slid down the wall and denies injuries. He did not completely lose consciousness. He eventually improved enough to get up and was very weak so he came to the hospital.   In the ER, his BP was initially low and his orthostatics were positive. He was given 500 cc IVF and feels better. At no time did he have any chest pain. His potassium was low and was supplemented with 40 meq. His mental status is currently good and he denies any pain or SOB. Orthostatics have not been rechecked since the IVF.   Past Medical History  Diagnosis Date  . Gout   . HTN (hypertension)   . Renal insufficiency   . Hyperlipidemia   . Osteoarthritis   . History of CVA (cerebrovascular accident)     Left pontine infarct July 2004; changed from Plavix to Coumadin in 2004 per MD at [facility]  . GERD (gastroesophageal reflux disease)   . DJD (degenerative joint disease)   . BPH (benign prostatic hypertrophy)   . Cardiomyopathy, hypertensive     NON-ISCHEMIC  . CAD (coronary artery disease) CARDIOLOGIST-  DR HOCHREIN    Non obstructive  . DM (diabetes mellitus), type 2   . Shortness of breath   . Peripheral vascular disease   . LBBB (left bundle branch block)   . Bilateral leg ulcer     ACHILLES AREA--  NONHEALING  . Left ventricular  diastolic dysfunction, NYHA class 3   . Cardiac LV ejection fraction 21-40%     25-30%  PER ECHO 1/14  . Systolic and diastolic CHF, acute on chronic     EF 25% PER ECHO 1/14     Past Surgical History  Procedure Laterality Date  . Cardiac catheterization  04-16-2011   DR [facility]    NON-OBSTRUCTIVE CAD. MILDLY ELEVATED PULMONARY PRESSURES/ ELEVATED  END-DIASTOLIC PRESSURE  . Transthoracic echocardiogram  12-31-2010    MODERATE CONCENTRIC LVH/ SYSTOLIC FUNCTION SEVERELY REDUCED/ EF 25-30%/  SEVERE HYPOKINESIS OF ANTEROSEPTAL MYOCARDIUM  AND ENTIREAPICAL MYOCARDIUM /  MODERATE HYPOKINESIS OF LATERAL, INFEROLATERAL, INFERIOR,AND INFEROSEPTAL MYOCARIUM/  GRADE 3 DIASTOLIC DYSFUNCTION/ MILD MR  . Aortogram w/ bilateral lower extremitiy runoff  05-18-2013  DR FIELDS    LEFT LEG OCCLUDED PERONEAL AND ANTERIOR TIBIAL ARTERIES/ HIGH GRADE STENOIS 80% MIDDLE AND DISTAL THIRD OF POSTERIOR TIBIAL ARTERY/ RIGHT PERONEAL AND ANTERIOR TIBIAL ARTERY OCCLUDED/ 40% STENOSIS DISTALLY     No Known Allergies  I have reviewed the patient's current medications Prior to Admission medications   Medication Sig  amLODipine (NORVASC) 10 MG tablet Take 1 tablet (10 mg total) by mouth daily.  atorvastatin (LIPITOR) 20 MG tablet Take 20 mg by mouth daily. For cholesterol  budesonide-formoterol (SYMBICORT) 160-4.5 MCG/ACT inhaler Inhale 2 puffs into the lungs daily as needed (for  breathing problems).   Cholecalciferol 1000 UNITS tablet Take 1,000 Units by mouth daily.    cloNIDine (CATAPRES) 0.2 MG tablet TAKE 1 TABLET (0.2 MG TOTAL) BY MOUTH 3 (THREE) TIMES DAILY.  collagenase (SANTYL) ointment Apply 1 application topically 2 (two) times a week.  cyanocobalamin 1000 MCG tablet Take 1 tablet (1,000 mcg total) by mouth daily.  doxycycline (DORYX) 100 MG EC tablet Take 100 mg by mouth 2 (two) times daily.  Fluticasone Furoate-Vilanterol (BREO ELLIPTA) 100-25 MCG/INH AEPB Inhale 1 puff into the lungs daily as needed (for  breathing problems).  glipiZIDE (GLUCOTROL) 5 MG tablet Take 2.5 mg by mouth 2 (two) times daily before a meal.  hydrALAZINE (APRESOLINE) 50 MG tablet Take 75 mg by mouth 3 (three) times daily.  HYDROcodone-acetaminophen (NORCO/VICODIN) 5-325 MG per tablet Take 1 tablet by mouth every 6 (six) hours as needed for pain.  isosorbide mononitrate (IMDUR) 120 MG 24 hr tablet Take 120 mg by mouth every morning.  labetalol (NORMODYNE) 200 MG tablet Take 200 mg by mouth 2 (two) times daily.  Menthol (HALLS COUGH DROPS MT) Use as directed 1 lozenge in the mouth or throat daily as needed (for cough).  NITROSTAT 0.4 MG SL tablet Place 0.4 mg under the tongue every 5 (five) minutes as needed for chest pain.   potassium chloride SA (K-DUR,KLOR-CON) 20 MEQ tablet Take 40 mEq by mouth 2 (two) times daily.  torsemide (DEMADEX) 100 MG tablet Take 0.5 tablets (50 mg total) by mouth daily.  triamcinolone cream (KENALOG) 0.5 % Apply 1 application topically 2 (two) times daily as needed (for skin breakdown).  warfarin (COUMADIN) 5 MG tablet Take 5-7.5 mg by mouth daily. Takes 5 mg daily, except tues and fri's-takes 7.5mg .     History   Social History  . Marital Status: Married    Spouse Name: N/A    Number of Children: N/A  . Years of Education: N/A   Occupational History  . Retired - Working in Product manager    Social History Main Topics  . Smoking status: Former Smoker    Quit date: 04/12/1974  . Smokeless tobacco: Never Used  . Alcohol Use: No  . Drug Use: No  . Sexually Active: Not on file   Other Topics Concern  . Not on file   Social History Narrative  . Lives with wife, she cares for him. He rarely leaves the house.    Family Status  Relation Status Death Age  . Mother Deceased   . Father Deceased    Family History  Problem Relation Age of Onset  . Hypertension Mother   . Heart disease Mother   . Diabetes Mother   . Gout Other   . Stroke Other   . Hypertension Father   . Heart  disease Father      ROS:  Full 14 point review of systems complete and found to be negative unless listed above.  Physical Exam: Blood pressure 154/81, pulse 70, temperature 98.6 F (37 C), temperature source Oral, resp. rate 19, SpO2 98.00%.  General: Well developed, well nourished, elderly male in no acute distress Head: Eyes PERRLA, No xanthomas.   Normocephalic and atraumatic, oropharynx without edema or exudate. Dentition: false teeth  Lungs: decreased BS bases, no crackles Heart:Heart slightly irregular rate and rhythm with S1, S2; no significant murmur. pulses are 2+ extrem.   Neck: No carotid bruits. No lymphadenopathy.  JVD not elevated. Abdomen: Bowel sounds present, abdomen soft and non-tender without masses or hernias noted.  Msk:  No spine or cva tenderness. Generalized weakness, no joint deformities or effusions. Extremities: No clubbing or cyanosis. UNNA boots in place on both LE. Trace pedal edema.  Neuro: Alert and oriented X 3. No focal deficits noted. Psych:  Good affect, responds appropriately Skin: No rashes or lesions noted.  Labs:   Lab Results  Component Value Date   WBC 9.1 06/09/2013   HGB 10.4* 06/09/2013   HCT 32.6* 06/09/2013   MCV 77.8* 06/09/2013   PLT 234 06/09/2013    Recent Labs  06/09/13 1243  INR 2.69*     Recent Labs Lab 06/09/13 1243  NA 146*  K 2.8*  CL 105  CO2 31  BUN 21  CREATININE 1.85*  CALCIUM 9.0  GLUCOSE 75    Recent Labs  06/09/13 1243  TROPONINI 0.32*   TSH  Date/Time Value Range Status  04/24/2013 10:11 AM 2.66  0.35 - 5.50 uIU/mL Final   Echo: 05/25/2013 Study Conclusions - Left ventricle: Prominant trabeculation in LV apex The cavity size was normal. Systolic function was severely reduced. The estimated ejection fraction was in the range of 25% to 30%. Hypokinesis of the entire myocardium. Doppler parameters are consistent with restrictive physiology, indicative of decreased left ventricular diastolic  compliance and/or increased left atrial pressure. - Aortic valve: Trivial regurgitation. - Mitral valve: Mild regurgitation. - Left atrium: The atrium was moderately dilated. - Right ventricle: Systolic function was reduced. - Right atrium: The atrium was mildly dilated. - Atrial septum: No defect or patent foramen ovale was identified. - Pulmonary arteries: PA peak pressure: 42mm Hg (S).  ECG:09-Jun-2013 12:39:30  SINUS RHYTHM ~ normal P axis, V-rate 50- 99 MULTIPLE ATRIAL PREMATURE COMPLEXES ~ SV complexes w/ short R-R intvls NONSPECIFIC IVCD WITH LAD ~ QRSd >164mS & LAD LEFT VENTRICULAR HYPERTROPHY ~ R56L/RISIII/S12R56/S3RL & LAA/LAD Vent. rate 71 BPM PR interval 164 ms QRS duration 174 ms QT/QTc 496/539 ms P-R-T axes 57 -73 101  Radiology:  Ct Head Wo Contrast 06/09/2013   *RADIOLOGY REPORT*  Clinical Data: Dizziness and confusion.  Anticoagulated.  CT HEAD WITHOUT CONTRAST  Technique:  Contiguous axial images were obtained from the base of the skull through the vertex without contrast.  Comparison: None.  Findings: There is diffuse patchy low density throughout the subcortical and periventricular white matter consistent with chronic small vessel ischemic change.  Chronic right thalamic lacunar infarct noted.  There is prominence of the sulci and ventricles consistent with brain atrophy.  There is no evidence for acute brain infarct, hemorrhage or mass.  The paranasal sinuses and mastoid air cells are clear.  The skull is intact.  IMPRESSION:  1.  No acute intracranial abnormalities. 2.  Small vessel ischemic change and brain atrophy.   Original Report Authenticated By: Signa Kell, M.D.   Dg Chest 2 View 06/09/2013   *RADIOLOGY REPORT*  Clinical Data: Dizziness.  CHEST - 2 VIEW  Comparison: 12/30/2012.  Findings: The heart is enlarged but stable.  The mediastinal and hilar contours are stable.  There is tortuosity and calcification of the thoracic aorta.  The lungs are clear.  No  pleural effusion. The bony thorax is intact.  IMPRESSION: Stable cardiac enlargement but no acute cardiopulmonary findings.   Original Report Authenticated By: Rudie Meyer, M.D.    ASSESSMENT AND PLAN:   The patient was seen today by Dr Eden Emms, the patient evaluated and the data reviewed.   Principal Problem:   Near syncope - feel primarily orthostatic secondary to low intravascular  volume. S/p IVF. He may also have an element of chronic orthostasis, will need to assess this in am. Would not hydrate aggressively, holding diuretics may be enough. Active Problems:   Acute on chronic combined systolic and diastolic congestive heart failure - feel his volume status is good right now. Hold diuretics for now, re-assess in am.    Hypokalemia - took home med 10 meq, rec'd 40 meq in ER, will give another 20 meq, recheck in am.   Elevated troponin - no ischemic symptoms and no history of obstructive CAD. Continue to cycle but would not plan further eval unless has ischemic symptoms or enzymes go very high.  Otherwise, per IM.    DM (diabetes mellitus), type 2 with renal complications   HYPERLIPIDEMIA   HYPERTENSION   CVA   CKD (chronic kidney disease) stage 3, GFR 30-59 ml/min   Wound, open, leg   Atherosclerotic peripheral vascular disease with intermittent claudication   Orthostatic hypotension   He has multiple comorbidities and previous strokes.  Hold demedex for tomorrow since he already had it today.  I suspect he always has some postural hypotension as confirmed by wife who knows him well.  Doubt primary cardiac issue here  Charlton Haws

## 2013-06-09 NOTE — ED Provider Notes (Signed)
History     CSN: [id number]  Arrival date & time 06/09/13  1230   First MD Initiated Contact with Patient 06/09/13 1234      Chief Complaint  Patient presents with  . Near Syncope    (Consider location/radiation/quality/duration/timing/severity/associated sxs/prior treatment) HPI Pt with history of multiple medical problems reports he was walking in his house just prior to arrival to tell his wife he was going to take a nap when he turned around too quickly and became dizzy. He helped himself to the floor by leaning on a wall and felt better when sitting down. He states this happens to him from time to time. He denies any headache, blurry vision, CP, SOB, diaphoresis, nausea or vomiting and states he feels fine now. Per EMS, wife reports some memory changes recently, but no acute AMS since this episode. He has BLE wound dressings on now changed at home by home health for poorly healing wound from several weeks ago.  Past Medical History  Diagnosis Date  . Gout   . HTN (hypertension)   . Renal insufficiency   . Hyperlipidemia   . Osteoarthritis   . History of CVA (cerebrovascular accident)     Left pontine infarct July 2004; changed from Plavix to Coumadin in 2004 per MD at [facility]  . GERD (gastroesophageal reflux disease)   . DJD (degenerative joint disease)   . BPH (benign prostatic hypertrophy)   . Cardiomyopathy, hypertensive     NON-ISCHEMIC  . CAD (coronary artery disease) CARDIOLOGIST-  DR HOCHREIN    Non obstructive  . DM (diabetes mellitus), type 2   . Shortness of breath   . Peripheral vascular disease   . LBBB (left bundle branch block)   . Bilateral leg ulcer     ACHILLES AREA--  NONHEALING  . Left ventricular diastolic dysfunction, NYHA class 3   . Cardiac LV ejection fraction 21-40%     25-30%  PER ECHO 1/14  . Systolic and diastolic CHF, acute on chronic     EF 25% PER ECHO 1/14    Past Surgical History  Procedure Laterality Date  . Cardiac  catheterization  04-16-2011   DR [facility]    NON-OBSTRUCTIVE CAD. MILDLY ELEVATED PULMONARY PRESSURES/ ELEVATED  END-DIASTOLIC PRESSURE  . Transthoracic echocardiogram  12-31-2010    MODERATE CONCENTRIC LVH/ SYSTOLIC FUNCTION SEVERELY REDUCED/ EF 25-30%/  SEVERE HYPOKINESIS OF ANTEROSEPTAL MYOCARDIUM  AND ENTIREAPICAL MYOCARDIUM /  MODERATE HYPOKINESIS OF LATERAL, INFEROLATERAL, INFERIOR,AND INFEROSEPTAL MYOCARIUM/  GRADE 3 DIASTOLIC DYSFUNCTION/ MILD MR  . Aortogram w/ bilateral lower extremitiy runoff  05-18-2013  DR FIELDS    LEFT LEG OCCLUDED PERONEAL AND ANTERIOR TIBIAL ARTERIES/ HIGH GRADE STENOIS 80% MIDDLE AND DISTAL THIRD OF POSTERIOR TIBIAL ARTERY/ RIGHT PERONEAL AND ANTERIOR TIBIAL ARTERY OCCLUDED/ 40% STENOSIS DISTALLY     Family History  Problem Relation Age of Onset  . Hypertension Mother   . Heart disease Mother   . Diabetes Mother   . Gout Other   . Stroke Other   . Hypertension Father   . Heart disease Father     History  Substance Use Topics  . Smoking status: Former Smoker    Quit date: 04/12/1974  . Smokeless tobacco: Never Used  . Alcohol Use: No      Review of Systems All other systems reviewed and are negative except as noted in HPI.   Allergies  Review of patient's allergies indicates no known allergies.  Home Medications   Current Outpatient Rx  Name  Route  Sig  Dispense  Refill  . amLODipine (NORVASC) 10 MG tablet   Oral   Take 1 tablet (10 mg total) by mouth daily.   30 tablet   11     90 day supply is acceptable   . atorvastatin (LIPITOR) 20 MG tablet   Oral   Take 20 mg by mouth daily. For cholesterol         . atorvastatin (LIPITOR) 20 MG tablet      TAKE 1 TABLET EVERY DAY FOR CHOLESTEROL   30 tablet   5   . budesonide-formoterol (SYMBICORT) 160-4.5 MCG/ACT inhaler   Inhalation   Inhale 2 puffs into the lungs daily as needed (for breathing problems).          . Cholecalciferol 1000 UNITS tablet   Oral   Take 1,000  Units by mouth daily.           . cloNIDine (CATAPRES) 0.2 MG tablet      TAKE 1 TABLET (0.2 MG TOTAL) BY MOUTH 3 (THREE) TIMES DAILY.   90 tablet   8   . clotrimazole-betamethasone (LOTRISONE) cream   Topical   Apply 1 application topically 2 (two) times daily as needed (for fungal treatment).          . cyanocobalamin 1000 MCG tablet   Oral   Take 1 tablet (1,000 mcg total) by mouth daily.   100 tablet   3   . doxycycline (DORYX) 100 MG EC tablet   Oral   Take 100 mg by mouth 2 (two) times daily.         . Fluticasone Furoate-Vilanterol (BREO ELLIPTA) 100-25 MCG/INH AEPB   Inhalation   Inhale 1 puff into the lungs daily as needed (for breathing problems).         [name] glipiZIDE (GLUCOTROL) 5 MG tablet   Oral   Take 2.5 mg by mouth 2 (two) times daily before a meal.         . hydrALAZINE (APRESOLINE) 50 MG tablet   Oral   Take 75 mg by mouth 3 (three) times daily.         [name] HYDROcodone-acetaminophen (NORCO/VICODIN) 5-325 MG per tablet      TAKE 1 TABLET BY MOUTH 4 TIMES A DAY   90 tablet   0   . isosorbide mononitrate (IMDUR) 120 MG 24 hr tablet   Oral   Take 120 mg by mouth every morning.         . isosorbide mononitrate (IMDUR) 120 MG 24 hr tablet      TAKE 1 TABLET (120 MG TOTAL) BY MOUTH EVERY MORNING.   30 tablet   8   . labetalol (NORMODYNE) 200 MG tablet   Oral   Take 200 mg by mouth 2 (two) times daily.         . Menthol (HALLS COUGH DROPS MT)   Mouth/Throat   Use as directed 1 lozenge in the mouth or throat daily as needed (for cough).         [name] NITROSTAT 0.4 MG SL tablet   Sublingual   Place 0.4 mg under the tongue every 5 (five) minutes as needed for chest pain.          . potassium chloride SA (K-DUR,KLOR-CON) 20 MEQ tablet   Oral   Take 40 mEq by mouth 2 (two) times daily.         [name] torsemide (DEMADEX) 100 MG tablet   Oral  Take 0.5 tablets (50 mg total) by mouth daily.   30 tablet   3   . triamcinolone cream  (KENALOG) 0.5 %   Topical   Apply 1 application topically 2 (two) times daily as needed (for skin breakdown).         . triamcinolone cream (KENALOG) 0.5 %      APPLY TO AFFECTED AREA TWICE DAILY   270 g   0   . warfarin (COUMADIN) 5 MG tablet   Oral   Take 5-7.5 mg by mouth daily. Takes 5 mg daily, except tues and fri's-takes 7.5mg .         . warfarin (COUMADIN) 5 MG tablet      TAKE 1 TABLET (5 MG TOTAL) BY MOUTH AS DIRECTED.   50 tablet   3     BP 138/83  Temp(Src) 98.6 F (37 C) (Oral)  Resp 22  SpO2 100%  Physical Exam  Nursing note and vitals reviewed. Constitutional: He is oriented to person, place, and time. He appears well-developed and well-nourished.  HENT:  Head: Normocephalic and atraumatic.  Eyes: EOM are normal. Pupils are equal, round, and reactive to light.  Neck: Normal range of motion. Neck supple.  Cardiovascular: Normal rate, normal heart sounds and intact distal pulses.   Pulmonary/Chest: Effort normal and breath sounds normal.  Abdominal: Bowel sounds are normal. He exhibits no distension. There is no tenderness.  Musculoskeletal: Normal range of motion. He exhibits no edema and no tenderness.  Bilateral UNA boots left in place, neurovascularly intact distally  Neurological: He is alert and oriented to person, place, and time. He has normal strength. No cranial nerve deficit or sensory deficit.  Skin: Skin is warm and dry. No rash noted.  Psychiatric: He has a normal mood and affect.    ED Course  Procedures (including critical care time)  Labs Reviewed  CBC WITH DIFFERENTIAL - Abnormal; Notable for the following:    RBC 4.19 (*)    Hemoglobin 10.4 (*)    HCT 32.6 (*)    MCV 77.8 (*)    MCH 24.8 (*)    RDW 15.9 (*)    Neutrophils Relative % 81 (*)    All other components within normal limits  BASIC METABOLIC PANEL - Abnormal; Notable for the following:    Sodium 146 (*)    Potassium 2.8 (*)    Creatinine, Ser 1.85 (*)    GFR  calc non Af Amer 34 (*)    GFR calc Af Amer 40 (*)    All other components within normal limits  TROPONIN I - Abnormal; Notable for the following:    Troponin I 0.32 (*)    All other components within normal limits  PROTIME-INR - Abnormal; Notable for the following:    Prothrombin Time 27.3 (*)    INR 2.69 (*)    All other components within normal limits  URINALYSIS, ROUTINE W REFLEX MICROSCOPIC - Abnormal; Notable for the following:    Protein, ur 30 (*)    All other components within normal limits  URINE MICROSCOPIC-ADD ON   Ct Head Wo Contrast  06/09/2013   *RADIOLOGY REPORT*  Clinical Data: Dizziness and confusion.  Anticoagulated.  CT HEAD WITHOUT CONTRAST  Technique:  Contiguous axial images were obtained from the base of the skull through the vertex without contrast.  Comparison: None.  Findings: There is diffuse patchy low density throughout the subcortical and periventricular white matter consistent with chronic small vessel ischemic change.  Chronic right thalamic lacunar infarct noted.  There is prominence of the sulci and ventricles consistent with brain atrophy.  There is no evidence for acute brain infarct, hemorrhage or mass.  The paranasal sinuses and mastoid air cells are clear.  The skull is intact.  IMPRESSION:  1.  No acute intracranial abnormalities. 2.  Small vessel ischemic change and brain atrophy.   Original Report Authenticated By: Signa Kell, M.D.     1. Near syncope   2. Hypokalemia   3. Orthostasis   4. Elevated troponin   5. CKD (chronic kidney disease), unspecified stage   6. Acute on chronic combined systolic and diastolic congestive heart failure       MDM   Date: 06/09/2013  Rate: 71  Rhythm: normal sinus rhythm and premature atrial contractions (PAC)  QRS Axis: left  Intervals: normal  ST/T Wave abnormalities: nonspecific T wave changes  Conduction Disutrbances:nonspecific intraventricular conduction delay  Narrative Interpretation:   Old  EKG Reviewed: unchanged  Wife at bedside states patient has had episode of confusion and ?hallucinations at home. For instance this morning he thought she was sitting on the couch and was talking to her even though she was in the other room. She thinks it may be related to his pain medications. He was orthostatic initially so given a saline bolus 500cc. Feeling better now, but Trop is positive. Will plan on admission for hydration, cycle enzymes.         Zadrian Mccauley B. Bernette Mayers, MD 06/09/13 [phone]

## 2013-06-10 DIAGNOSIS — I4891 Unspecified atrial fibrillation: Principal | ICD-10-CM

## 2013-06-10 DIAGNOSIS — I739 Peripheral vascular disease, unspecified: Secondary | ICD-10-CM

## 2013-06-10 DIAGNOSIS — I5043 Acute on chronic combined systolic (congestive) and diastolic (congestive) heart failure: Secondary | ICD-10-CM | POA: Diagnosis not present

## 2013-06-10 DIAGNOSIS — I43 Cardiomyopathy in diseases classified elsewhere: Secondary | ICD-10-CM | POA: Diagnosis not present

## 2013-06-10 DIAGNOSIS — M25579 Pain in unspecified ankle and joints of unspecified foot: Secondary | ICD-10-CM

## 2013-06-10 DIAGNOSIS — L98499 Non-pressure chronic ulcer of skin of other sites with unspecified severity: Secondary | ICD-10-CM

## 2013-06-10 DIAGNOSIS — E1129 Type 2 diabetes mellitus with other diabetic kidney complication: Secondary | ICD-10-CM | POA: Diagnosis not present

## 2013-06-10 DIAGNOSIS — R55 Syncope and collapse: Secondary | ICD-10-CM | POA: Diagnosis not present

## 2013-06-10 LAB — GLUCOSE, CAPILLARY
Glucose-Capillary: 115 mg/dL — ABNORMAL HIGH (ref 70–99)
Glucose-Capillary: 90 mg/dL (ref 70–99)
Glucose-Capillary: 118 mg/dL — ABNORMAL HIGH (ref 70–99)

## 2013-06-10 LAB — BASIC METABOLIC PANEL
CO2: 30 mEq/L (ref 19–32)
Calcium: 9 mg/dL (ref 8.4–10.5)
Chloride: 104 mEq/L (ref 96–112)
GFR calc Af Amer: 40 mL/min — ABNORMAL LOW (ref >90)
Sodium: 145 mEq/L (ref 135–145)

## 2013-06-10 LAB — CBC
MCH: 24.7 pg — ABNORMAL LOW (ref 26.0–34.0)
MCHC: 31.8 g/dL (ref 30.0–36.0)
MCV: 77.8 fL — ABNORMAL LOW (ref 78.0–100.0)
Platelets: 231 10*3/uL (ref 150–400)
RBC: 4.41 MIL/uL (ref 4.22–5.81)
RDW: 16.1 % — ABNORMAL HIGH (ref 11.5–15.5)

## 2013-06-10 LAB — PROTIME-INR
INR: 2.33 — ABNORMAL HIGH (ref 0.00–1.49)
Prothrombin Time: 24.5 seconds — ABNORMAL HIGH (ref 11.6–15.2)

## 2013-06-10 LAB — TSH: TSH: 2.557 u[IU]/mL (ref 0.350–4.500)

## 2013-06-10 LAB — HEMOGLOBIN A1C: Mean Plasma Glucose: 128 mg/dL — ABNORMAL HIGH (ref <117)

## 2013-06-10 LAB — MAGNESIUM: Magnesium: 1.6 mg/dL (ref 1.5–2.5)

## 2013-06-10 MED ORDER — DILTIAZEM HCL 100 MG IV SOLR
5.0000 mg/h | INTRAVENOUS | Status: DC
  Administered 2013-06-10: 5 mg/h via INTRAVENOUS
  Filled 2013-06-10: qty 100

## 2013-06-10 MED ORDER — METOPROLOL TARTRATE 1 MG/ML IV SOLN
5.0000 mg | Freq: Once | INTRAVENOUS | Status: AC
  Administered 2013-06-10: 5 mg via INTRAVENOUS
  Filled 2013-06-10: qty 5

## 2013-06-10 MED ORDER — AMIODARONE HCL IN DEXTROSE 360-4.14 MG/200ML-% IV SOLN
30.0000 mg/h | INTRAVENOUS | Status: DC
  Administered 2013-06-10 (×2): 30 mg/h via INTRAVENOUS
  Filled 2013-06-10 (×5): qty 200

## 2013-06-10 MED ORDER — AMIODARONE HCL IN DEXTROSE 360-4.14 MG/200ML-% IV SOLN
60.0000 mg/h | INTRAVENOUS | Status: AC
  Administered 2013-06-10 (×2): 60 mg/h via INTRAVENOUS
  Filled 2013-06-10: qty 200

## 2013-06-10 MED ORDER — POTASSIUM CHLORIDE CRYS ER 20 MEQ PO TBCR
40.0000 meq | EXTENDED_RELEASE_TABLET | Freq: Once | ORAL | Status: AC
  Administered 2013-06-10: 40 meq via ORAL

## 2013-06-10 MED ORDER — POTASSIUM CHLORIDE 10 MEQ/100ML IV SOLN
10.0000 meq | INTRAVENOUS | Status: AC
  Administered 2013-06-10 (×4): 10 meq via INTRAVENOUS
  Filled 2013-06-10 (×4): qty 100

## 2013-06-10 MED ORDER — DILTIAZEM LOAD VIA INFUSION
20.0000 mg | Freq: Once | INTRAVENOUS | Status: AC
  Administered 2013-06-10: 20 mg via INTRAVENOUS
  Filled 2013-06-10: qty 20

## 2013-06-10 MED ORDER — AMIODARONE LOAD VIA INFUSION
150.0000 mg | Freq: Once | INTRAVENOUS | Status: AC
  Administered 2013-06-10: 150 mg via INTRAVENOUS
  Filled 2013-06-10: qty 83.34

## 2013-06-10 NOTE — Progress Notes (Signed)
HR converted to sinus rhythm in the 60s. Frank Mcgregor NP notified. Will continue to monitor.

## 2013-06-10 NOTE — Progress Notes (Signed)
CRITICAL VALUE ALERT  Critical value received: Troponin 0.75  Date of notification:  06-10-13  Time of notification:  0958  Critical value read back:yes  Nurse who received alert:JACOBS L.,RN    MD notified (1st page):  Nishan  Time of first page:  1018  MD notified (2nd page):  Time of second page:  Responding MD:  Eden Emms  Time MD responded:  1020    No new orders given at this time. Will continue to monitor patient for further changes in condition.

## 2013-06-10 NOTE — Progress Notes (Signed)
Patient ID: Frank Mclean, male   DOB: [birth year], 72 y.o.   MRN: [id number]    Subjective:  Denies SSCP, palpitations or Dyspnea Apparantly had tachycardia last night. Tele shows PAF    Objective:  Filed Vitals:   06/10/13 0440 06/10/13 0515 06/10/13 0553 06/10/13 0624  BP: 153/100 152/91 153/95 151/98  Pulse: 64 68 70 76  Temp:      TempSrc:      Resp:      Height:      Weight:      SpO2:        Intake/Output from previous day: No intake or output data in the 24 hours ending 06/10/13 0917  Physical Exam: Affect appropriate Chronically ill black male HEENT: normal Neck supple with no adenopathy JVP normal no bruits no thyromegaly Lungs clear with no wheezing and good diaphragmatic motion Heart:  S1/S2 no murmur, no rub, gallop or click PMI normal Abdomen: benighn, BS positve, no tenderness, no AAA no bruit.  No HSM or HJR Distal pulses intact with no bruits Plus 2 edema with Una wraps  Neuro non-focal Skin warm and dry No muscular weakness   Lab Results: Basic Metabolic Panel:  Recent Labs  78/29/56 1243 06/09/13 2103 06/10/13 0001 06/10/13 0250  NA 146*  --  145  --   K 2.8*  --  2.5*  --   CL 105  --  104  --   CO2 31  --  30  --   GLUCOSE 75  --  118*  --   BUN 21  --  22  --   CREATININE 1.85*  --  1.83*  --   CALCIUM 9.0  --  9.0  --   MG  --  1.5  --  1.6   CBC:  Recent Labs  06/09/13 1243 06/10/13 0001  WBC 9.1 10.2  NEUTROABS 7.4  --   HGB 10.4* 10.9*  HCT 32.6* 34.3*  MCV 77.8* 77.8*  PLT 234 231   Cardiac Enzymes:  Recent Labs  06/09/13 1243 06/09/13 2103 06/10/13 0250  TROPONINI 0.32* <0.30 <0.30    Imaging: Dg Chest 2 View  06/09/2013   *RADIOLOGY REPORT*  Clinical Data: Dizziness.  CHEST - 2 VIEW  Comparison: 12/30/2012.  Findings: The heart is enlarged but stable.  The mediastinal and hilar contours are stable.  There is tortuosity and calcification of the thoracic aorta.  The lungs are clear.  No pleural effusion. The  bony thorax is intact.  IMPRESSION: Stable cardiac enlargement but no acute cardiopulmonary findings.   Original Report Authenticated By: Rudie Meyer, M.D.   Ct Head Wo Contrast  06/09/2013   *RADIOLOGY REPORT*  Clinical Data: Dizziness and confusion.  Anticoagulated.  CT HEAD WITHOUT CONTRAST  Technique:  Contiguous axial images were obtained from the base of the skull through the vertex without contrast.  Comparison: None.  Findings: There is diffuse patchy low density throughout the subcortical and periventricular white matter consistent with chronic small vessel ischemic change.  Chronic right thalamic lacunar infarct noted.  There is prominence of the sulci and ventricles consistent with brain atrophy.  There is no evidence for acute brain infarct, hemorrhage or mass.  The paranasal sinuses and mastoid air cells are clear.  The skull is intact.  IMPRESSION:  1.  No acute intracranial abnormalities. 2.  Small vessel ischemic change and brain atrophy.   Original Report Authenticated By: Signa Kell, M.D.    Cardiac Studies:  ECG:  SR LBBB LAD    Telemetry:  SR rate LBBB PVC  PAF rates 140-150    Echo: 05/25/13  EF 25-30%    Medications:   . amLODipine  5 mg Oral Daily  . atorvastatin  20 mg Oral Daily  . cholecalciferol  1,000 Units Oral Daily  . doxycycline  100 mg Oral Q12H  . glipiZIDE  2.5 mg Oral BID AC  . insulin aspart  0-9 Units Subcutaneous TID WC  . insulin aspart  3 Units Subcutaneous TID WC  . isosorbide mononitrate  60 mg Oral Daily  . labetalol  100 mg Oral BID  . potassium chloride SA  40 mEq Oral BID  . sodium chloride  3 mL Intravenous Q12H  . sodium chloride  3 mL Intravenous Q12H  . torsemide  50 mg Oral Daily  . vitamin B-12  1,000 mcg Oral Daily  . warfarin  5 mg Oral Custom  . [START ON 06/12/2013] warfarin  7.5 mg Oral Custom  . Warfarin - Pharmacist Dosing Inpatient   Does not apply q1800     . diltiazem (CARDIZEM) infusion 5 mg/hr (06/10/13 0418)     Assessment/Plan:  PAF:  With poor EF will use amiodarone.  Load iv and d/c cardizem given postural symptoms and low EF CHF: Euvolemic diuretic will be held today given postural signs and low K K supplement CVA:  PT/OT multiple prevous strokes Skin- Una boots LE weight bear  Charlton Haws 06/10/2013, 9:17 AM

## 2013-06-10 NOTE — Progress Notes (Signed)
HR sustaining 120-140's irregular rhythm. Pt denies chest pain or SOB. BP is 152/112. Frank Mcgregor NP notified. Orders received.

## 2013-06-10 NOTE — Progress Notes (Signed)
HR continues in 110-150s sustained. Rhythm irregular. Vss. No complaints at this time. Elray Mcgregor NP notified. Orders received.

## 2013-06-10 NOTE — Progress Notes (Signed)
ANTICOAGULATION CONSULT NOTE - Initial Consult  Pharmacy Consult for Coumadin Indication: afib/hx of stroke  No Known Allergies  Patient Measurements: Height: 6\' 2"  (188 cm) Weight: 203 lb 0.7 oz (92.1 kg) IBW/kg (Calculated) : 82.2  Vital Signs: BP: 146/87 mmHg (06/22 0944) Pulse Rate: 65 (06/22 0944)  Labs:  Recent Labs  06/09/13 1243 06/09/13 2103 06/10/13 0001 06/10/13 0250 06/10/13 0910  HGB 10.4*  --  10.9*  --   --   HCT 32.6*  --  34.3*  --   --   PLT 234  --  231  --   --   LABPROT 27.3*  --   --   --  24.5*  INR 2.69*  --   --   --  2.33*  CREATININE 1.85*  --  1.83*  --   --   TROPONINI 0.32* <0.30  --  <0.30 0.75*    Estimated Creatinine Clearance: 41.2 ml/min (by C-G formula based on Cr of 1.83).   Medical History: Past Medical History  Diagnosis Date  . Gout   . HTN (hypertension)   . Renal insufficiency   . Hyperlipidemia   . Osteoarthritis   . History of CVA (cerebrovascular accident)     Left pontine infarct July 2004; changed from Plavix to Coumadin in 2004 per MD at [facility]  . GERD (gastroesophageal reflux disease)   . DJD (degenerative joint disease)   . BPH (benign prostatic hypertrophy)   . Cardiomyopathy, hypertensive     NON-ISCHEMIC  . CAD (coronary artery disease) CARDIOLOGIST-  DR HOCHREIN    Non obstructive  . DM (diabetes mellitus), type 2   . Shortness of breath   . Peripheral vascular disease   . LBBB (left bundle branch block)   . Bilateral leg ulcer     ACHILLES AREA--  NONHEALING  . Left ventricular diastolic dysfunction, NYHA class 3   . Cardiac LV ejection fraction 21-40%     25-30%  PER ECHO 1/14  . Systolic and diastolic CHF, acute on chronic     EF 25% PER ECHO 1/14     Assessment: 12 YOM admitted for a near syncopal event. Patient is on chronic coumadin for afib, INR at goal on admit.  INR therapeutic today at 2.33. Head CT no acute intracranial abnormalities.  PTA dose: 5mg  daily except for 7.5 mg on  Tuesday and Friday  Goal of Therapy:  INR 2-3 Monitor platelets by anticoagulation protocol: Yes   Plan:  Continue home dose of coumadin-5mg  daily except 7.5mg  on Tues and Fri. Daily PT/INR.  Wendie Simmer, PharmD, BCPS Clinical Pharmacist  Pager: [phone]

## 2013-06-10 NOTE — Progress Notes (Signed)
CRITICAL VALUE ALERT  Critical value received: potassium 2.5 Date of notification: 06/10/13 Time of notification: 0035 Critical value read back: yes Nurse who received alert: Waymon Budge RN MD notified (1st page): Elray Mcgregor NP Time of first page: 0040 MD notified (2nd page):  Time of second page:  Responding MD: Elray Mcgregor NP Time MD responded: 0042   HR now 110-150s. Vss. Elray Mcgregor NP notified.

## 2013-06-10 NOTE — Progress Notes (Signed)
Patient ID: Frank Mclean  male  ION:[id number]    DOB: 1941-10-27    DOA: 06/09/2013  PCP: [name], MD  Assessment/Plan:  Atrial fibrillation with RVR with positive troponin - Overnight events noted, patient was placed on Cardizem IV drip. Cardiology following the patient and started on amiodarone drip after Cardizem bolus. -Continue anticoagulation with Coumadin   Near-syncope : Possibly due to atrial fibrillation paroxysmal and orthostatic hypotension, Also troponins positive ? Troponin leak vs NSTEMI  Orthostatic hypotension  - Patient has received IV fluids in the ED   Hypokalemia  - Replaced, will recheck again today  Diabetes mellitus  -Check hemoglobin A1c, Continue glipizide.  -Sensitive sliding scale while in the hospital.   Chronic combined CHF  -Appears compensated at this moment, cardiology following.   Chronic kidney disease stage III  -Creatinine at baseline.   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 Darrick Penna with vascular surgery.  -He currently has unnaboots on that were placed yesterday. The wife describes that he gets these placed 3 times a week on Monday Wednesdays and Fridays.  -Is chronically on doxycycline which we will continue in the hospital.   History of CVA  -Is maintained on anticoagulation with Coumadin.  -INR is therapeutic, pharmacy to dose Coumadin.   DVT prophylaxis  -Fully anticoagulated on Coumadin.  DVT Prophylaxis:  Code Status:  Disposition:  Subjective: No specific complaints from the patient, denies any chest pain or shortness of breath  Objective: Weight change:   Intake/Output Summary (Last 24 hours) at 06/10/13 1149 Last data filed at 06/10/13 0938  Gross per 24 hour  Intake    243 ml  Output      0 ml  Net    243 ml   Blood pressure 146/87, pulse 65, temperature 97.5 F (36.4 C), temperature source Oral, resp. rate  22, height 6\' 2"  (1.88 m), weight 92.1 kg (203 lb 0.7 oz), SpO2 99.00%.  Physical Exam: General: Alert and awake, oriented x3, not in any acute distress. CVS:  irregularly irregular,  Chest: clear to auscultation bilaterally, no wheezing, rales or rhonchi Abdomen: soft nontender, nondistended, normal bowel sounds  Extremities:UNNA boots on   Lab Results: Basic Metabolic Panel:  Recent Labs Lab 06/09/13 1243  06/10/13 0001 06/10/13 0250  NA 146*  --  145  --   K 2.8*  --  2.5*  --   CL 105  --  104  --   CO2 31  --  30  --   GLUCOSE 75  --  118*  --   BUN 21  --  22  --   CREATININE 1.85*  --  1.83*  --   CALCIUM 9.0  --  9.0  --   MG  --   < >  --  1.6  < > = values in this interval not displayed. Liver Function Tests: No results found for this basename: AST, ALT, ALKPHOS, BILITOT, PROT, ALBUMIN,  in the last 168 hours No results found for this basename: LIPASE, AMYLASE,  in the last 168 hours No results found for this basename: AMMONIA,  in the last 168 hours CBC:  Recent Labs Lab 06/09/13 1243 06/10/13 0001  WBC 9.1 10.2  NEUTROABS 7.4  --   HGB 10.4* 10.9*  HCT 32.6* 34.3*  MCV 77.8* 77.8*  PLT 234 231   Cardiac Enzymes:  Recent Labs Lab 06/09/13 2103  06/10/13 0250 06/10/13 0910  TROPONINI <0.30 <0.30 0.75*   BNP: No components found with this basename: POCBNP,  CBG:  Recent Labs Lab 06/06/13 1126 06/09/13 2130 06/10/13 0035 06/10/13 0731  GLUCAP 111* 120* 115* 152*     Micro Results: No results found for this or any previous visit (from the past 240 hour(s)).  Studies/Results: Dg Chest 2 View  06/09/2013   *RADIOLOGY REPORT*  Clinical Data: Dizziness.  CHEST - 2 VIEW  Comparison: 12/30/2012.  Findings: The heart is enlarged but stable.  The mediastinal and hilar contours are stable.  There is tortuosity and calcification of the thoracic aorta.  The lungs are clear.  No pleural effusion. The bony thorax is intact.  IMPRESSION: Stable cardiac  enlargement but no acute cardiopulmonary findings.   Original Report Authenticated By: Rudie Meyer, M.D.   Ct Head Wo Contrast  06/09/2013   *RADIOLOGY REPORT*  Clinical Data: Dizziness and confusion.  Anticoagulated.  CT HEAD WITHOUT CONTRAST  Technique:  Contiguous axial images were obtained from the base of the skull through the vertex without contrast.  Comparison: None.  Findings: There is diffuse patchy low density throughout the subcortical and periventricular white matter consistent with chronic small vessel ischemic change.  Chronic right thalamic lacunar infarct noted.  There is prominence of the sulci and ventricles consistent with brain atrophy.  There is no evidence for acute brain infarct, hemorrhage or mass.  The paranasal sinuses and mastoid air cells are clear.  The skull is intact.  IMPRESSION:  1.  No acute intracranial abnormalities. 2.  Small vessel ischemic change and brain atrophy.   Original Report Authenticated By: Signa Kell, M.D.    Medications: Scheduled Meds: . amLODipine  5 mg Oral Daily  . atorvastatin  20 mg Oral Daily  . cholecalciferol  1,000 Units Oral Daily  . doxycycline  100 mg Oral Q12H  . glipiZIDE  2.5 mg Oral BID AC  . insulin aspart  0-9 Units Subcutaneous TID WC  . insulin aspart  3 Units Subcutaneous TID WC  . isosorbide mononitrate  60 mg Oral Daily  . labetalol  100 mg Oral BID  . potassium chloride SA  40 mEq Oral BID  . sodium chloride  3 mL Intravenous Q12H  . sodium chloride  3 mL Intravenous Q12H  . torsemide  50 mg Oral Daily  . vitamin B-12  1,000 mcg Oral Daily  . warfarin  5 mg Oral Custom  . [START ON 06/12/2013] warfarin  7.5 mg Oral Custom  . Warfarin - Pharmacist Dosing Inpatient   Does not apply q1800      LOS: 1 day   Nyna Chilton M.D. [facility] 06/10/2013, 11:49 AM Pager: [phone]  If 7PM-7AM, please contact night-coverage [email or web address] Password [email or web address]

## 2013-06-10 NOTE — Progress Notes (Signed)
Subjective/Objective Tachycardia with rate 130-140 on telemetry.  Mr. Kotowski denies chest pain or pressure, dyspnea or nausea. He does report symptoms of indigestion.  Physical Examination: General appearance - alert, well appearing, and in no distress Chest - clear to auscultation, no wheezes, rales or rhonchi, symmetric air entry Heart - tachycardic, regular rate Extremities - no pedal edema noted, intact peripheral pulses  Scheduled Meds: . sodium chloride   Intravenous STAT  . amLODipine  5 mg Oral Daily  . atorvastatin  20 mg Oral Daily  . cholecalciferol  1,000 Units Oral Daily  . doxycycline  100 mg Oral Q12H  . glipiZIDE  2.5 mg Oral BID AC  . hydrALAZINE  75 mg Oral BID  . insulin aspart  0-9 Units Subcutaneous TID WC  . insulin aspart  3 Units Subcutaneous TID WC  . isosorbide mononitrate  60 mg Oral Daily  . labetalol  100 mg Oral BID  . potassium chloride SA  40 mEq Oral BID  . sodium chloride  3 mL Intravenous Q12H  . sodium chloride  3 mL Intravenous Q12H  . torsemide  50 mg Oral Daily  . vitamin B-12  1,000 mcg Oral Daily  . warfarin  5 mg Oral Custom  . [START ON 06/12/2013] warfarin  7.5 mg Oral Custom  . Warfarin - Pharmacist Dosing Inpatient   Does not apply q1800   Continuous Infusions:  PRN Meds:sodium chloride, acetaminophen, acetaminophen, nitroGLYCERIN, ondansetron (ZOFRAN) IV, ondansetron, oxyCODONE, senna-docusate, sodium chloride  Vital signs in last 24 hours: Temp:  [97.5 F (36.4 C)-99 F (37.2 C)] 97.5 F (36.4 C) (06/21 2138) Pulse Rate:  [65-144] 144 (06/21 2247) Resp:  [14-23] 22 (06/21 2247) BP: (97-185)/(59-112) 149/104 mmHg (06/22 0037) SpO2:  [98 %-100 %] 100 % (06/21 2247) Weight:  [92.1 kg (203 lb 0.7 oz)] 92.1 kg (203 lb 0.7 oz) (06/21 1832)  I Problem Assessment/Plan  Tachycardia: 12 Lead EKG appears to be a ST with a bundle branch block - minimal change in basic rhythm than prior - rate is faster. Seriel troponin so far: 0.32 ->  0.30. As he is hypertensive - will give IV lopressor and see if this slows his   rate as well.  Hypertension: BP 152/112 after Imdur, hydralazine, labetalol and amlodipine. Lopressor 5 mg IV x 1  Indigestion: GI cocktail x 1  Hypokalemia - repeat K+ 2.5 (received 60 meq earlier today for K+ 2.8) - replace with 40 meq IV and 40 meq po. Repeat BMP at 5am (6/22).

## 2013-06-11 ENCOUNTER — Ambulatory Visit: Payer: Medicare Other | Admitting: Cardiology

## 2013-06-11 DIAGNOSIS — M25579 Pain in unspecified ankle and joints of unspecified foot: Secondary | ICD-10-CM | POA: Diagnosis not present

## 2013-06-11 DIAGNOSIS — I739 Peripheral vascular disease, unspecified: Secondary | ICD-10-CM | POA: Diagnosis not present

## 2013-06-11 DIAGNOSIS — I5043 Acute on chronic combined systolic (congestive) and diastolic (congestive) heart failure: Secondary | ICD-10-CM | POA: Diagnosis not present

## 2013-06-11 DIAGNOSIS — I4891 Unspecified atrial fibrillation: Secondary | ICD-10-CM | POA: Diagnosis not present

## 2013-06-11 DIAGNOSIS — R55 Syncope and collapse: Secondary | ICD-10-CM | POA: Diagnosis not present

## 2013-06-11 LAB — BASIC METABOLIC PANEL
Calcium: 9.2 mg/dL (ref 8.4–10.5)
GFR calc Af Amer: 39 mL/min — ABNORMAL LOW (ref >90)
GFR calc non Af Amer: 34 mL/min — ABNORMAL LOW (ref >90)
Potassium: 3.1 mEq/L — ABNORMAL LOW (ref 3.5–5.1)
Sodium: 148 mEq/L — ABNORMAL HIGH (ref 135–145)

## 2013-06-11 LAB — PROTIME-INR: INR: 2.32 — ABNORMAL HIGH (ref 0.00–1.49)

## 2013-06-11 LAB — GLUCOSE, CAPILLARY
Glucose-Capillary: 160 mg/dL — ABNORMAL HIGH (ref 70–99)
Glucose-Capillary: 105 mg/dL — ABNORMAL HIGH (ref 70–99)

## 2013-06-11 LAB — TROPONIN I: Troponin I: 0.46 ng/mL (ref <0.30)

## 2013-06-11 MED ORDER — COLLAGENASE 250 UNIT/GM EX OINT
TOPICAL_OINTMENT | CUTANEOUS | Status: DC
  Administered 2013-06-13: 10:00:00 via TOPICAL
  Filled 2013-06-11: qty 30

## 2013-06-11 MED ORDER — WARFARIN SODIUM 4 MG PO TABS
4.0000 mg | ORAL_TABLET | Freq: Once | ORAL | Status: AC
  Administered 2013-06-11: 4 mg via ORAL
  Filled 2013-06-11: qty 1

## 2013-06-11 MED ORDER — POTASSIUM CHLORIDE CRYS ER 20 MEQ PO TBCR
40.0000 meq | EXTENDED_RELEASE_TABLET | Freq: Once | ORAL | Status: AC
  Administered 2013-06-11: 40 meq via ORAL

## 2013-06-11 MED ORDER — COLLAGENASE 250 UNIT/GM EX OINT
TOPICAL_OINTMENT | Freq: Every day | CUTANEOUS | Status: DC
  Administered 2013-06-11: 12:00:00 via TOPICAL
  Filled 2013-06-11: qty 30

## 2013-06-11 MED ORDER — AMIODARONE HCL 200 MG PO TABS
400.0000 mg | ORAL_TABLET | Freq: Three times a day (TID) | ORAL | Status: DC
  Administered 2013-06-11 – 2013-06-13 (×7): 400 mg via ORAL
  Filled 2013-06-11 (×11): qty 2

## 2013-06-11 NOTE — Progress Notes (Signed)
SUBJECTIVE:  No acute complaints.  His wife has been concerned about the fact that he is "seeing things."  Otherwise she says his memory is about the same and he has no other focal neurologic complaints.   PHYSICAL EXAM Filed Vitals:   06/10/13 1400 06/10/13 2100 06/11/13 0100 06/11/13 0500  BP: 135/73 143/77 155/91 147/98  Pulse: 64 65 66 67  Temp: 98.1 F (36.7 C) 98.4 F (36.9 C) 98.3 F (36.8 C) 99 F (37.2 C)  TempSrc:    Oral  Resp: 17 18 18 18   Height:      Weight:      SpO2: 100% 100% 100% 100%   General:  No distress Lungs:  Clear Heart:  RRR Abdomen:  Positive bowel sounds, no rebound no guarding Extremities:  No edema.  Neuro:  Nonfocal  LABS: Lab Results  Component Value Date   TROPONINI 0.46* 06/11/2013   Results for orders placed during the hospital encounter of 06/09/13 (from the past 24 hour(s))  GLUCOSE, CAPILLARY     Status: Abnormal   Collection Time    06/10/13  7:31 AM      Result Value Range   Glucose-Capillary 152 (*) 70 - 99 mg/dL  TROPONIN I     Status: Abnormal   Collection Time    06/10/13  9:10 AM      Result Value Range   Troponin I 0.75 (*) <0.30 ng/mL  PROTIME-INR     Status: Abnormal   Collection Time    06/10/13  9:10 AM      Result Value Range   Prothrombin Time 24.5 (*) 11.6 - 15.2 seconds   INR 2.33 (*) 0.00 - 1.49  GLUCOSE, CAPILLARY     Status: Abnormal   Collection Time    06/10/13 11:50 AM      Result Value Range   Glucose-Capillary 131 (*) 70 - 99 mg/dL  POTASSIUM     Status: None   Collection Time    06/10/13 12:48 PM      Result Value Range   Potassium 3.5  3.5 - 5.1 mEq/L  TROPONIN I     Status: Abnormal   Collection Time    06/10/13  3:35 PM      Result Value Range   Troponin I 0.68 (*) <0.30 ng/mL  GLUCOSE, CAPILLARY     Status: None   Collection Time    06/10/13  4:41 PM      Result Value Range   Glucose-Capillary 90  70 - 99 mg/dL  TROPONIN I     Status: Abnormal   Collection Time    06/10/13   9:01 PM      Result Value Range   Troponin I 0.50 (*) <0.30 ng/mL  GLUCOSE, CAPILLARY     Status: Abnormal   Collection Time    06/10/13  9:06 PM      Result Value Range   Glucose-Capillary 118 (*) 70 - 99 mg/dL  TROPONIN I     Status: Abnormal   Collection Time    06/11/13  2:29 AM      Result Value Range   Troponin I 0.46 (*) <0.30 ng/mL  PROTIME-INR     Status: Abnormal   Collection Time    06/11/13  4:00 AM      Result Value Range   Prothrombin Time 24.4 (*) 11.6 - 15.2 seconds   INR 2.32 (*) 0.00 - 1.49  BASIC METABOLIC PANEL     Status: Abnormal  Collection Time    06/11/13  4:00 AM      Result Value Range   Sodium 148 (*) 135 - 145 mEq/L   Potassium 3.1 (*) 3.5 - 5.1 mEq/L   Chloride 109  96 - 112 mEq/L   CO2 30  19 - 32 mEq/L   Glucose, Bld 142 (*) 70 - 99 mg/dL   BUN 17  6 - 23 mg/dL   Creatinine, Ser 8.11 (*) 0.50 - 1.35 mg/dL   Calcium 9.2  8.4 - 91.4 mg/dL   GFR calc non Af Amer 34 (*) >90 mL/min   GFR calc Af Amer 39 (*) >90 mL/min    Intake/Output Summary (Last 24 hours) at 06/11/13 0646 Last data filed at 06/11/13 7829  Gross per 24 hour  Intake  599.2 ml  Output   1500 ml  Net -900.8 ml     ASSESSMENT AND PLAN:  ATRIAL FIBRILLATION:  On warfarin, continue amiodarone.  I will switch to PO.   NEAR SYNCOPE:  Thought to be related to orthostasis.  Continuing current diuresis.  He is currently on a lower dose of amlodipine, and clonidine is being held.    ACUTE ON CHRONIC SYSTOLIC AND DIASTOLIC HF:  As above.    ELEVATED TROPONIN:  No suggestion of ischemia.  Probably related to volume and arrhythmia.  Trending down.  No further ischemia work up.    CKD:  Creatinine stable.   LEG WOUNDS:  Consult wound care as his wraps are to be changed today.   Primary team:  Please consider a wound consult and PT  Rollene Rotunda 06/11/2013 6:46 AM

## 2013-06-11 NOTE — Progress Notes (Signed)
Utilization review completed.  

## 2013-06-11 NOTE — Consult Note (Addendum)
WOC consult Note Reason for Consult: Leg wounds  Wound type: Full thickness ulcer to medial left lower leg Measurement: 10cm X 5.5cm X 0.2cm Wound bed:40% yellow, 60% red, no odor Drainage (amount, consistency, odor): Moderate amount of yellow drainage Periwound: Intact  Wound type: Full thickness ulcer to later right lower leg Measurement: 5.5cm X 4cm X 0.2cm Wound bed: 75% yellow, 25% red, no odor Drainage (amount, consistency, odor): Moderate amount of yellow drainage Periwound: Intact  Dressing procedure/placement/frequency: Patient has been followed at the [facility], also has home health.  Prior to admission using santyl ointment to wounds and wrapping with Profore dressing.  Will continue this current treatment, to be changed Monday, Wednesday, Friday.  If patient remains in the hospital WOC will change on Wednesday, if not patient should follow up with Wound Center and continue home health. Norva Karvonen RN, MSN Cammie Mcgee MSN, RN, [place], [place], [place] [phone]

## 2013-06-11 NOTE — Progress Notes (Signed)
Clinical Social Work Department BRIEF PSYCHOSOCIAL ASSESSMENT 06/11/2013  Patient:  Frank Mclean, Frank Mclean     Account Number:  [id number]     Admit date:  06/09/2013  Clinical Social Worker:  Lourdes Sledge  Date/Time:  06/11/2013 02:41 PM  Referred by:  Physician  Date Referred:  06/11/2013 Referred for  SNF Placement   Other Referral:   Interview type:  Family Other interview type:   CSW completed assessmen with pt spouse who presents as caregiver.    PSYCHOSOCIAL DATA Living Status:  WIFE Admitted from facility:   Level of care:   Primary support name:  Taylon Coole [phone] Primary support relationship to patient:  SPOUSE Degree of support available:   Pt spouse is pt main caregiver.    CURRENT CONCERNS Current Concerns  Post-Acute Placement   Other Concerns:    SOCIAL WORK ASSESSMENT / PLAN CSW received a referral that pt spouse interested in SNF placement.    CSW spoke with pt spouse to answer any questions regarding SNF as pt has not been seen by PT. CSW confirmed that wife is pt main caretaker however pt has wounds and has a recent fall. Pt wife expressed concern with pt wounds and pt decline. CSW explored whether wife and pt have discussed SNF placement however wife stated she and pt have not. CSW informed spouse that CSW would need to discuss SNF placement (if recommended by PT) with spouse as spouse presents alert and oriented. Wife agreeable and will await PT recommendations. Wife agreeable to [facility] PT/OT if PT not recommending SNF.    CSW to assess pt for SNF placement once appropriate recommendations from PT have been made.   Assessment/plan status:  Psychosocial Support/Ongoing Assessment of Needs Other assessment/ plan:   Information/referral to community resources:   CSW will provide appropriate resources.    PATIENT'S/FAMILY'S RESPONSE TO PLAN OF CARE: Pt presents alert and oriented however physician informed CSW that  pt spouse (pt caregiver) had  questions regarding SNF. CSW spoke with wife however CSW will await  PT/OT recommendations to determine level of care pt will need at dc. Wife agreeable to take pt home if [facility] recommended.    CSW to remain following.       Theresia Bough, MSW, Theresia Majors [phone]

## 2013-06-11 NOTE — Progress Notes (Signed)
ANTICOAGULATION CONSULT NOTE - Follow Up Consult  Pharmacy Consult for Warfarin Indication: atrial fibrillation  No Known Allergies  Patient Measurements: Height: 6\' 2"  (188 cm) Weight: 203 lb 0.7 oz (92.1 kg) IBW/kg (Calculated) : 82.2  Vital Signs: Temp: 98.4 F (36.9 C) (06/23 1211) Temp src: Oral (06/23 0500) BP: 163/101 mmHg (06/23 1211) Pulse Rate: 69 (06/23 1211)  Labs:  Recent Labs  06/09/13 1243  06/10/13 0001  06/10/13 0910 06/10/13 1535 06/10/13 2101 06/11/13 0229 06/11/13 0400  HGB 10.4*  --  10.9*  --   --   --   --   --   --   HCT 32.6*  --  34.3*  --   --   --   --   --   --   PLT 234  --  231  --   --   --   --   --   --   LABPROT 27.3*  --   --   --  24.5*  --   --   --  24.4*  INR 2.69*  --   --   --  2.33*  --   --   --  2.32*  CREATININE 1.85*  --  1.83*  --   --   --   --   --  1.86*  TROPONINI 0.32*  < >  --   < > 0.75* 0.68* 0.50* 0.46*  --   < > = values in this interval not displayed.  Estimated Creatinine Clearance: 40.5 ml/min (by C-G formula based on Cr of 1.86).   Assessment: 72 y.o. M resumed on warfarin from PTA for hx Afib with a therapeutic INR this morning (INR 2.32 << 2.33, goal of 2-3). No CBC today, no s/sx of bleeding noted.  It is noted that the patient started amiodarone on 6/22 and is currently being loaded. Amiodarone has been known to increase warfarin sensitivity (class interaction: D) requiring empiric dose reductions of warfarin by 30-50%. Will go ahead and reduce warfarin dose empirically to account for this and will continue to monitor closely.  Goal of Therapy:  INR 2-3   Plan:  1. Warfarin 4 mg x 1 dose at 1800 today 2. Will continue to monitor for any signs/symptoms of bleeding and will follow up with PT/INR in the a.m.   Georgina Pillion, PharmD, BCPS Clinical Pharmacist Pager: [phone] 06/11/2013 3:21 PM

## 2013-06-11 NOTE — Progress Notes (Signed)
Patient ID: Frank Mclean  male  WUJ:[id number]    DOB: 1941/02/21    DOA: 06/09/2013  PCP: [name], MD  Assessment/Plan:  Atrial fibrillation with RVR with positive troponin - started on amiodarone, cardiology following   -Continue anticoagulation with Coumadin   Near-syncope : Possibly due to atrial fibrillation paroxysmal and orthostatic hypotension, Also troponins positive ? Troponin leak vs NSTEMI - Neurology no further ischemia workup  Hypokalemia  - Replaced  Diabetes mellitus  -Check hemoglobin A1c, Continue glipizide.  -Sensitive sliding scale while in the hospital.   Chronic combined CHF  -Appears compensated at this moment, cardiology following.   Chronic kidney disease stage III  -Creatinine at baseline.   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 Darrick Penna with vascular surgery.  -He currently has unnaboots on that were placed yesterday. The wife describes that he gets these placed 3 times a week on Monday Wednesdays and Fridays.  -Is chronically on doxycycline which we will continue in the hospital. Wound care consulted  History of CVA  -Is maintained on anticoagulation with Coumadin.  -INR is therapeutic, pharmacy to dose Coumadin.   DVT prophylaxis  -Fully anticoagulated on Coumadin.  DVT Prophylaxis:  Code Status:  Disposition: PT consult ordered, discussed in detail with patient's wife, having difficulty caring for him at home, may need a skilled nursing facility    Subjective: No specific complaints from the patient, wife at bedside, and no overnight events   Objective: Weight change:   Intake/Output Summary (Last 24 hours) at 06/11/13 1608 Last data filed at 06/11/13 1554  Gross per 24 hour  Intake  299.9 ml  Output   1400 ml  Net -1100.1 ml   Blood pressure 163/101, pulse 69, temperature 98.4 F (36.9 C), temperature source Oral, resp. rate  20, height 6\' 2"  (1.88 m), weight 92.1 kg (203 lb 0.7 oz), SpO2 100.00%.  Physical Exam: General: Alert and awake, not in any acute distress. CVS:  irregularly irregular,  Chest: clear to auscultation bilaterally, no wheezing, rales or rhonchi Abdomen: soft nontender, nondistended, normal bowel sounds  Extremities:UNNA boots on   Lab Results: Basic Metabolic Panel:  Recent Labs Lab 06/10/13 0001 06/10/13 0250 06/10/13 1248 06/11/13 0400  NA 145  --   --  148*  K 2.5*  --  3.5 3.1*  CL 104  --   --  109  CO2 30  --   --  30  GLUCOSE 118*  --   --  142*  BUN 22  --   --  17  CREATININE 1.83*  --   --  1.86*  CALCIUM 9.0  --   --  9.2  MG  --  1.6  --   --    Liver Function Tests: No results found for this basename: AST, ALT, ALKPHOS, BILITOT, PROT, ALBUMIN,  in the last 168 hours No results found for this basename: LIPASE, AMYLASE,  in the last 168 hours No results found for this basename: AMMONIA,  in the last 168 hours CBC:  Recent Labs Lab 06/09/13 1243 06/10/13 0001  WBC 9.1 10.2  NEUTROABS 7.4  --   HGB 10.4* 10.9*  HCT 32.6* 34.3*  MCV 77.8* 77.8*  PLT 234 231   Cardiac Enzymes:  Recent Labs Lab 06/10/13 1535 06/10/13 2101 06/11/13 0229  TROPONINI 0.68* 0.50* 0.46*   BNP: No components found with this basename: POCBNP,  CBG:  Recent Labs Lab 06/10/13 1150 06/10/13 1641 06/10/13 2106 06/11/13 0751 06/11/13 1212  GLUCAP 131* 90 118* 160* 119*     Micro Results: No results found for this or any previous visit (from the past 240 hour(s)).  Studies/Results: Dg Chest 2 View  06/09/2013   *RADIOLOGY REPORT*  Clinical Data: Dizziness.  CHEST - 2 VIEW  Comparison: 12/30/2012.  Findings: The heart is enlarged but stable.  The mediastinal and hilar contours are stable.  There is tortuosity and calcification of the thoracic aorta.  The lungs are clear.  No pleural effusion. The bony thorax is intact.  IMPRESSION: Stable cardiac enlargement but no  acute cardiopulmonary findings.   Original Report Authenticated By: Rudie Meyer, M.D.   Ct Head Wo Contrast  06/09/2013   *RADIOLOGY REPORT*  Clinical Data: Dizziness and confusion.  Anticoagulated.  CT HEAD WITHOUT CONTRAST  Technique:  Contiguous axial images were obtained from the base of the skull through the vertex without contrast.  Comparison: None.  Findings: There is diffuse patchy low density throughout the subcortical and periventricular white matter consistent with chronic small vessel ischemic change.  Chronic right thalamic lacunar infarct noted.  There is prominence of the sulci and ventricles consistent with brain atrophy.  There is no evidence for acute brain infarct, hemorrhage or mass.  The paranasal sinuses and mastoid air cells are clear.  The skull is intact.  IMPRESSION:  1.  No acute intracranial abnormalities. 2.  Small vessel ischemic change and brain atrophy.   Original Report Authenticated By: Signa Kell, M.D.    Medications: Scheduled Meds: . amiodarone  400 mg Oral TID  . amLODipine  5 mg Oral Daily  . atorvastatin  20 mg Oral Daily  . cholecalciferol  1,000 Units Oral Daily  . [START ON 06/13/2013] collagenase   Topical 3 times weekly  . doxycycline  100 mg Oral Q12H  . glipiZIDE  2.5 mg Oral BID AC  . insulin aspart  0-9 Units Subcutaneous TID WC  . insulin aspart  3 Units Subcutaneous TID WC  . isosorbide mononitrate  60 mg Oral Daily  . labetalol  100 mg Oral BID  . potassium chloride SA  40 mEq Oral BID  . potassium chloride  40 mEq Oral Once  . sodium chloride  3 mL Intravenous Q12H  . sodium chloride  3 mL Intravenous Q12H  . torsemide  50 mg Oral Daily  . vitamin B-12  1,000 mcg Oral Daily  . warfarin  4 mg Oral ONCE-1800  . Warfarin - Pharmacist Dosing Inpatient   Does not apply q1800      LOS: 2 days   RAI,RIPUDEEP M.D. [facility] 06/11/2013, 4:08 PM Pager: [phone]  If 7PM-7AM, please contact  night-coverage [email or web address] Password [email or web address]

## 2013-06-12 DIAGNOSIS — I5043 Acute on chronic combined systolic (congestive) and diastolic (congestive) heart failure: Secondary | ICD-10-CM | POA: Diagnosis not present

## 2013-06-12 DIAGNOSIS — R55 Syncope and collapse: Secondary | ICD-10-CM | POA: Diagnosis not present

## 2013-06-12 DIAGNOSIS — M25579 Pain in unspecified ankle and joints of unspecified foot: Secondary | ICD-10-CM | POA: Diagnosis not present

## 2013-06-12 DIAGNOSIS — L98499 Non-pressure chronic ulcer of skin of other sites with unspecified severity: Secondary | ICD-10-CM | POA: Diagnosis not present

## 2013-06-12 DIAGNOSIS — I70219 Atherosclerosis of native arteries of extremities with intermittent claudication, unspecified extremity: Secondary | ICD-10-CM

## 2013-06-12 DIAGNOSIS — I4891 Unspecified atrial fibrillation: Secondary | ICD-10-CM | POA: Diagnosis not present

## 2013-06-12 LAB — GLUCOSE, CAPILLARY
Glucose-Capillary: 143 mg/dL — ABNORMAL HIGH (ref 70–99)
Glucose-Capillary: 79 mg/dL (ref 70–99)
Glucose-Capillary: 90 mg/dL (ref 70–99)

## 2013-06-12 LAB — PROTIME-INR
INR: 2.29 — ABNORMAL HIGH (ref 0.00–1.49)
Prothrombin Time: 24.2 seconds — ABNORMAL HIGH (ref 11.6–15.2)

## 2013-06-12 LAB — BASIC METABOLIC PANEL
CO2: 30 mEq/L (ref 19–32)
Calcium: 9.3 mg/dL (ref 8.4–10.5)
Chloride: 106 mEq/L (ref 96–112)
Creatinine, Ser: 2.12 mg/dL — ABNORMAL HIGH (ref 0.50–1.35)
GFR calc Af Amer: 34 mL/min — ABNORMAL LOW (ref >90)
Sodium: 149 mEq/L — ABNORMAL HIGH (ref 135–145)

## 2013-06-12 LAB — URINALYSIS, ROUTINE W REFLEX MICROSCOPIC
Bilirubin Urine: NEGATIVE
Ketones, ur: NEGATIVE mg/dL
Leukocytes, UA: NEGATIVE
Nitrite: NEGATIVE
Specific Gravity, Urine: 1.016 (ref 1.005–1.030)
Urobilinogen, UA: 0.2 mg/dL (ref 0.0–1.0)
pH: 5.5 (ref 5.0–8.0)

## 2013-06-12 MED ORDER — WARFARIN SODIUM 5 MG PO TABS
5.0000 mg | ORAL_TABLET | Freq: Once | ORAL | Status: AC
  Administered 2013-06-12: 5 mg via ORAL
  Filled 2013-06-12: qty 1

## 2013-06-12 NOTE — Progress Notes (Signed)
Pharmacist Heart Failure Core Measure Documentation  Assessment: Frank Mclean has an EF documented as 25-30% on 05/25/13 by ECHO.  Rationale: Heart failure patients with left ventricular systolic dysfunction (LVSD) and an EF < 40% should be prescribed an angiotensin converting enzyme inhibitor (ACEI) or angiotensin receptor blocker (ARB) at discharge unless a contraindication is documented in the medical record.  This patient is not currently on an ACEI or ARB for HF.  This note is being placed in the record in order to provide documentation that a contraindication to the use of these agents is present for this encounter.  ACE Inhibitor or Angiotensin Receptor Blocker is contraindicated (specify all that apply)  []   ACEI allergy AND ARB allergy []   Angioedema []   Moderate or severe aortic stenosis []   Hyperkalemia []   Hypotension []   Renal artery stenosis [x]   Worsening renal function, preexisting renal disease or dysfunction   Adrionna Delcid, Tsz-Yin 06/12/2013 1:26 PM

## 2013-06-12 NOTE — Progress Notes (Signed)
Clinical Social Worker (CSW) visited pt room and confirmed pt is agreeable to ST SNF placement. CSW to fax pt out and submit for a pasarr. CSW will follow up with spouse with bed offers, per pt request.  Theresia Bough, MSW, Theresia Majors [phone]

## 2013-06-12 NOTE — Evaluation (Signed)
Physical Therapy Evaluation Patient Details Name: Frank Mclean MRN: [id number] DOB: [birth year] Today's Date: 06/12/2013 Time: 0920-0950 PT Time Calculation (min): 30 min  PT Assessment / Plan / Recommendation Clinical Impression  72 year old pt admitted due to near syncope.  Pt with balance issues at home and needing more and more help from wife.  pt has bilateral unna boots and walked with cane or nothing.  Pt has increased trunk sway with gait and demonstrated safest gait with RW.  Pt would benefit from SNF level therapy at discharge to get stronger and imrpoved balance to prevent falls in home setting.  wife is agreeabel to this and pt thinking about it.  Wife is supportive of pt.      PT Assessment  Patient needs continued PT services    Follow Up Recommendations  SNF (if not snf then [facility])    Does the patient have the potential to tolerate intense rehabilitation      Barriers to Discharge        Equipment Recommendations  Rolling walker with 5" wheels    Recommendations for Other Services     Frequency Min 3X/week    Precautions / Restrictions Precautions Precautions: Fall Precaution Comments: Pt with bilateral wound dressings on both legs Restrictions Weight Bearing Restrictions: No   Pertinent Vitals/Pain Sitting BP 148/90  Standing BP 120/76.  Pt with no dizziness throughout therapy      Mobility  Transfers Transfers: Sit to Stand;Stand to Sit Sit to Stand: 4: Min assist Stand to Sit: 4: Min assist Details for Transfer Assistance: verbal cues for safety Ambulation/Gait Ambulation/Gait Assistance: 3: Mod assist;4: Min assist Ambulation Distance (Feet): 100 Feet Ambulation/Gait Assistance Details: Pt reports he is used to walking with cane.  pt stood with his slip on sandels and cane and walked 50 feet with OT and PT min assist.  Pt wobbly with excessive sway in all directions making him a fall risk.  We had pt stop and walk 50 feet with RW.  Pt steady with CGA  only.  Pt needed cues to keep his feet in the wakler and keep walker with him when backing up to sit down.  Pt agreed he was safest with walker. Gait Pattern: Step-to pattern;Decreased stride length;Trunk flexed;Shuffle;Lateral trunk lean to right;Lateral trunk lean to left    Exercises     PT Diagnosis: Difficulty walking;Generalized weakness  PT Problem List: Decreased activity tolerance;Decreased knowledge of use of DME;Decreased safety awareness;Cardiopulmonary status limiting activity PT Treatment Interventions: DME instruction;Gait training;Functional mobility training;Therapeutic activities;Therapeutic exercise;Patient/family education;Balance training   PT Goals Acute Rehab PT Goals PT Goal Formulation: With patient/family Potential to Achieve Goals: Good Pt will go Sit to Stand: with supervision PT Goal: Sit to Stand - Progress: Goal set today Pt will Transfer Bed to Chair/Chair to Bed: with supervision PT Transfer Goal: Bed to Chair/Chair to Bed - Progress: Goal set today Pt will Ambulate: >150 feet;with rolling walker PT Goal: Ambulate - Progress: Goal set today Pt will Perform Home Exercise Program: Independently PT Goal: Perform Home Exercise Program - Progress: Goal set today  Visit Information  Last PT Received On: 06/12/13 Assistance Needed: +2 PT/OT Co-Evaluation/Treatment: Yes    Subjective Data  Subjective: I feel OK Patient Stated Goal: to get back home   Prior Functioning  Home Living Lives With: Spouse Available Help at Discharge: Family Type of Home: House Home Access: Stairs to enter [employer] of Steps: 4 Entrance Stairs-Rails: Right Home Layout: Two level;Able to  live on main level with bedroom/bathroom Alternate Level Stairs-Number of Steps: doesnt need to go to the basement Bathroom Shower/Tub:  (sponge bathes) Communication Communication: No difficulties    Cognition  Cognition Arousal/Alertness: Awake/alert Behavior During  Therapy: WFL for tasks assessed/performed Overall Cognitive Status: Within Functional Limits for tasks assessed    Extremity/Trunk Assessment Right Upper Extremity Assessment RUE ROM/Strength/Tone: Within functional levels Left Upper Extremity Assessment LUE ROM/Strength/Tone: Within functional levels Right Lower Extremity Assessment RLE ROM/Strength/Tone: Within functional levels (Pt has unna boot on leg and ankle stiff) Left Lower Extremity Assessment LLE ROM/Strength/Tone: Within functional levels (Pt has unna boot on leg and ankle stiff) Trunk Assessment Trunk Assessment: Normal   Balance Balance Balance Assessed: No Static Standing Balance Static Standing - Level of Assistance: 4: Min assist Static Standing - Comment/# of Minutes: Pt needs to hold on when stnading at EOB for safety Dynamic Standing Balance Dynamic Standing - Level of Assistance: 4: Min assist;3: Mod assist  End of Session PT - End of Session Equipment Utilized During Treatment: Gait belt Activity Tolerance: Patient tolerated treatment well Patient left: in chair;with family/visitor present;with call bell/phone within reach Nurse Communication: Mobility status  GP     Judson Roch 06/12/2013, 1:20 PM  Ranae Palms, PT

## 2013-06-12 NOTE — Care Management (Signed)
CM was able to speak to pt's wife [name] and they are both agreeable to SNF placement. CM did give the pt a list of SNF options in [place]. CSW will f/u with pt with SNF options. Gala Lewandowsky, RN, BSN Care Manager [phone].

## 2013-06-12 NOTE — Progress Notes (Signed)
Patient ID: Frank Mclean  male  ZOX:[id number]    DOB: 10-Oct-1941    DOA: 06/09/2013  PCP: [name], MD  Assessment/Plan:  Atrial fibrillation with RVR with positive troponin- rate controlled now - started on amiodarone this admission, cardiology following   -Continue anticoagulation with Coumadin   Near-syncope : Possibly due to atrial fibrillation paroxysmal and orthostatic hypotension, Also troponins positive ? Troponin leak vs NSTEMI - Per cardiology: no further ischemia workup  Diabetes mellitus  -Hemoglobin A1c 6.1, Continue glipizide.  -Sensitive sliding scale while in the hospital.   Chronic combined CHF  -Appears compensated at this moment, cardiology following.   Chronic kidney disease stage III  -Creatinine trended upto 2.1 today, on torsemide, cardiology following,   Chronic bilateral lower extremity wounds with peripheral vascular disease  -Is followed chronically by the wound care clinic as well as Dr. Darrick Penna with vascular surgery.  -He currently has unnaboots on that were placed yesterday. The wife describes that he gets these placed 3 times a week on Monday Wednesdays and Fridays.  -Is chronically on doxycycline which we will continue in the hospital. Wound care consulted and have seen the patient.  History of CVA  -Is maintained on anticoagulation with Coumadin.  -INR is therapeutic, pharmacy to dose Coumadin.   DVT prophylaxis  -Fully anticoagulated on Coumadin.  DVT Prophylaxis:  Code Status:  Disposition: PT recommending skilled nursing facility. Discussed with patient, wife who were agreeable, notified Child psychotherapist.   Subjective: No specific complaints except urinary urgency, no dysuria or hematuria  Objective: Weight change:   Intake/Output Summary (Last 24 hours) at 06/12/13 1326 Last data filed at 06/11/13 1554  Gross per 24 hour  Intake      0 ml  Output    300 ml  Net   -300 ml   Blood pressure 136/85, pulse 69, temperature 99.4 F  (37.4 C), temperature source Oral, resp. rate 18, height 6\' 2"  (1.88 m), weight 92.1 kg (203 lb 0.7 oz), SpO2 93.00%.  Physical Exam: General: Alert and awake, not in any acute distress. CVS:  irregularly irregular,  Chest: CTAB, no wheezing, rales or rhonchi Abdomen: soft nontender, nondistended, normal bowel sounds  Extremities:UNNA boots on   Lab Results: Basic Metabolic Panel:  Recent Labs Lab 06/10/13 0250  06/11/13 0400 06/12/13 0400  NA  --   --  148* 149*  K  --   < > 3.1* 3.6  CL  --   --  109 106  CO2  --   --  30 30  GLUCOSE  --   --  142* 87  BUN  --   --  17 18  CREATININE  --   --  1.86* 2.12*  CALCIUM  --   --  9.2 9.3  MG 1.6  --   --   --   < > = values in this interval not displayed. Liver Function Tests: No results found for this basename: AST, ALT, ALKPHOS, BILITOT, PROT, ALBUMIN,  in the last 168 hours No results found for this basename: LIPASE, AMYLASE,  in the last 168 hours No results found for this basename: AMMONIA,  in the last 168 hours CBC:  Recent Labs Lab 06/09/13 1243 06/10/13 0001  WBC 9.1 10.2  NEUTROABS 7.4  --   HGB 10.4* 10.9*  HCT 32.6* 34.3*  MCV 77.8* 77.8*  PLT 234 231   Cardiac Enzymes:  Recent Labs Lab 06/10/13 1535 06/10/13 2101 06/11/13 0229  TROPONINI 0.68*  0.50* 0.46*   BNP: No components found with this basename: POCBNP,  CBG:  Recent Labs Lab 06/11/13 1212 06/11/13 1640 06/11/13 2037 06/12/13 0823 06/12/13 1213  GLUCAP 119* 80 105* 143* 79     Micro Results: No results found for this or any previous visit (from the past 240 hour(s)).  Studies/Results: Dg Chest 2 View  06/09/2013   *RADIOLOGY REPORT*  Clinical Data: Dizziness.  CHEST - 2 VIEW  Comparison: 12/30/2012.  Findings: The heart is enlarged but stable.  The mediastinal and hilar contours are stable.  There is tortuosity and calcification of the thoracic aorta.  The lungs are clear.  No pleural effusion. The bony thorax is intact.   IMPRESSION: Stable cardiac enlargement but no acute cardiopulmonary findings.   Original Report Authenticated By: Rudie Meyer, M.D.   Ct Head Wo Contrast  06/09/2013   *RADIOLOGY REPORT*  Clinical Data: Dizziness and confusion.  Anticoagulated.  CT HEAD WITHOUT CONTRAST  Technique:  Contiguous axial images were obtained from the base of the skull through the vertex without contrast.  Comparison: None.  Findings: There is diffuse patchy low density throughout the subcortical and periventricular white matter consistent with chronic small vessel ischemic change.  Chronic right thalamic lacunar infarct noted.  There is prominence of the sulci and ventricles consistent with brain atrophy.  There is no evidence for acute brain infarct, hemorrhage or mass.  The paranasal sinuses and mastoid air cells are clear.  The skull is intact.  IMPRESSION:  1.  No acute intracranial abnormalities. 2.  Small vessel ischemic change and brain atrophy.   Original Report Authenticated By: Signa Kell, M.D.    Medications: Scheduled Meds: . amiodarone  400 mg Oral TID  . amLODipine  5 mg Oral Daily  . atorvastatin  20 mg Oral Daily  . cholecalciferol  1,000 Units Oral Daily  . [START ON 06/13/2013] collagenase   Topical 3 times weekly  . doxycycline  100 mg Oral Q12H  . glipiZIDE  2.5 mg Oral BID AC  . insulin aspart  0-9 Units Subcutaneous TID WC  . insulin aspart  3 Units Subcutaneous TID WC  . isosorbide mononitrate  60 mg Oral Daily  . labetalol  100 mg Oral BID  . potassium chloride SA  40 mEq Oral BID  . sodium chloride  3 mL Intravenous Q12H  . sodium chloride  3 mL Intravenous Q12H  . torsemide  50 mg Oral Daily  . vitamin B-12  1,000 mcg Oral Daily  . warfarin  5 mg Oral ONCE-1800  . Warfarin - Pharmacist Dosing Inpatient   Does not apply q1800      LOS: 3 days   RAI,RIPUDEEP M.D. [facility] 06/12/2013, 1:26 PM Pager: [phone]  If 7PM-7AM, please contact  night-coverage [email or web address] Password [email or web address]

## 2013-06-12 NOTE — Progress Notes (Signed)
ANTICOAGULATION CONSULT NOTE - Follow Up Consult  Pharmacy Consult for Warfarin Indication: atrial fibrillation  No Known Allergies  Patient Measurements: Height: 6\' 2"  (188 cm) Weight: 203 lb 0.7 oz (92.1 kg) IBW/kg (Calculated) : 82.2  Vital Signs: Temp: 99.4 F (37.4 C) (06/24 0430) BP: 136/85 mmHg (06/24 0432) Pulse Rate: 69 (06/24 0432)  Labs:  Recent Labs  06/10/13 0001  06/10/13 0910 06/10/13 1535 06/10/13 2101 06/11/13 0229 06/11/13 0400 06/12/13 0400  HGB 10.9*  --   --   --   --   --   --   --   HCT 34.3*  --   --   --   --   --   --   --   PLT 231  --   --   --   --   --   --   --   LABPROT  --   --  24.5*  --   --   --  24.4* 24.2*  INR  --   --  2.33*  --   --   --  2.32* 2.29*  CREATININE 1.83*  --   --   --   --   --  1.86* 2.12*  TROPONINI  --   < > 0.75* 0.68* 0.50* 0.46*  --   --   < > = values in this interval not displayed.  Estimated Creatinine Clearance: 35.5 ml/min (by C-G formula based on Cr of 2.12).   Assessment: 72 y.o. M resumed on warfarin from PTA for hx Afib with a therapeutic INR this morning (INR 2.29 << 2.32, goal of 2-3). No CBC today, no s/sx of bleeding noted.  It is noted that the patient started amiodarone on 6/22 and is currently being loaded. Amiodarone has been known to increase warfarin sensitivity (class interaction: D) requiring empiric dose reductions of warfarin by 30-50%. Will continue to monitor this interaction closely.  Goal of Therapy:  INR 2-3   Plan:  1. Warfarin 5 mg x 1 dose at 1800 today 2. Will continue to monitor for any signs/symptoms of bleeding and will follow up with PT/INR in the a.m.   Georgina Pillion, PharmD, BCPS Clinical Pharmacist Pager: [phone] 06/12/2013 1:18 PM

## 2013-06-12 NOTE — Progress Notes (Signed)
Clinical Social Work Department CLINICAL SOCIAL WORK PLACEMENT NOTE 06/12/2013  Patient:  SPIRO, AUSBORN  Account Number:  [id number] Admit date:  06/09/2013  Clinical Social Worker:  Theresia Bough, Theresia Majors  Date/time:  06/12/2013 02:13 PM  Clinical Social Work is seeking post-discharge placement for this patient at the following level of care:   SKILLED NURSING   (*CSW will update this form in Epic as items are completed)   06/12/2013  Patient/family provided with [facility] Department of Clinical Social Work's list of facilities offering this level of care within the geographic area requested by the patient (or if unable, by the patient's family).  06/12/2013  Patient/family informed of their freedom to choose among providers that offer the needed level of care, that participate in Medicare, Medicaid or managed care program needed by the patient, have an available bed and are willing to accept the patient.  06/12/2013  Patient/family informed of [facility]' ownership interest in [facility], as well as of the fact that they are under no obligation to receive care at this facility.  PASARR submitted to [employer] on 06/12/2013 PASARR number received from [employer] on   FL2 transmitted to all facilities in geographic area requested by pt/family on  06/12/2013 FL2 transmitted to all facilities within larger geographic area on   Patient informed that his/her managed care company has contracts with or will negotiate with  certain facilities, including the following:     Patient/family informed of bed offers received:   Patient chooses bed at  Physician recommends and patient chooses bed at    Patient to be transferred to  on   Patient to be transferred to facility by   The following physician request were entered in Epic:   Additional Comments:  Theresia Bough, MSW, [employer] [phone]

## 2013-06-12 NOTE — Progress Notes (Signed)
Patient ID: Frank Mclean, male   DOB: 14-Mar-1941, 72 y.o.   MRN: [id number]   SUBJECTIVE:  Urinary urgency per wife.     PHYSICAL EXAM Filed Vitals:   06/11/13 1641 06/11/13 2000 06/12/13 0430 06/12/13 0432  BP: 139/95 153/88 143/84 136/85  Pulse: 70 71 62 69  Temp: 97.9 F (36.6 C) 97.5 F (36.4 C) 99.4 F (37.4 C)   TempSrc:      Resp: 18 18 18    Height:      Weight:      SpO2: 100% 95% 93%    General:  No distress Lungs:  Clear Heart:  RRR Abdomen:  Positive bowel sounds, no rebound no guarding Extremities:  No edema.  Neuro:  Nonfocal  LABS: Lab Results  Component Value Date   TROPONINI 0.46* 06/11/2013   Results for orders placed during the hospital encounter of 06/09/13 (from the past 24 hour(s))  GLUCOSE, CAPILLARY     Status: Abnormal   Collection Time    06/11/13 12:12 PM      Result Value Range   Glucose-Capillary 119 (*) 70 - 99 mg/dL  GLUCOSE, CAPILLARY     Status: None   Collection Time    06/11/13  4:40 PM      Result Value Range   Glucose-Capillary 80  70 - 99 mg/dL  GLUCOSE, CAPILLARY     Status: Abnormal   Collection Time    06/11/13  8:37 PM      Result Value Range   Glucose-Capillary 105 (*) 70 - 99 mg/dL  PROTIME-INR     Status: Abnormal   Collection Time    06/12/13  4:00 AM      Result Value Range   Prothrombin Time 24.2 (*) 11.6 - 15.2 seconds   INR 2.29 (*) 0.00 - 1.49  BASIC METABOLIC PANEL     Status: Abnormal   Collection Time    06/12/13  4:00 AM      Result Value Range   Sodium 149 (*) 135 - 145 mEq/L   Potassium 3.6  3.5 - 5.1 mEq/L   Chloride 106  96 - 112 mEq/L   CO2 30  19 - 32 mEq/L   Glucose, Bld 87  70 - 99 mg/dL   BUN 18  6 - 23 mg/dL   Creatinine, Ser 4.09 (*) 0.50 - 1.35 mg/dL   Calcium 9.3  8.4 - 81.1 mg/dL   GFR calc non Af Amer 29 (*) >90 mL/min   GFR calc Af Amer 34 (*) >90 mL/min    Intake/Output Summary (Last 24 hours) at 06/12/13 0815 Last data filed at 06/11/13 1554  Gross per 24 hour  Intake  663.7  ml  Output    300 ml  Net  363.7 ml     ASSESSMENT AND PLAN:  ATRIAL FIBRILLATION:  In NSR this am continue PO amiodarone  NEAR SYNCOPE:  Thought to be related to orthostasis.  Continuing current diuresis.  He is currently on a lower dose of amlodipine, and clonidine is being held.    ACUTE ON CHRONIC SYSTOLIC AND DIASTOLIC HF:  As above.    ELEVATED TROPONIN:  No suggestion of ischemia.  Probably related to volume and arrhythmia.  Trending down.  No further ischemia work up.    CKD:  Creatinine stable.   LEG WOUNDS:  Consult wound care as his wraps are to be changed today.   Primary team:  Please consider a wound consult and PT  Need  to check UA C&S as patient usually not incontinent and urgency is new  Charlton Haws 06/12/2013 8:15 AM

## 2013-06-13 ENCOUNTER — Non-Acute Institutional Stay (INDEPENDENT_AMBULATORY_CARE_PROVIDER_SITE_OTHER): Payer: Medicare Other | Admitting: Family Medicine

## 2013-06-13 DIAGNOSIS — E876 Hypokalemia: Secondary | ICD-10-CM | POA: Diagnosis not present

## 2013-06-13 DIAGNOSIS — Z0000 Encounter for general adult medical examination without abnormal findings: Secondary | ICD-10-CM | POA: Diagnosis not present

## 2013-06-13 DIAGNOSIS — I509 Heart failure, unspecified: Secondary | ICD-10-CM

## 2013-06-13 DIAGNOSIS — I951 Orthostatic hypotension: Secondary | ICD-10-CM | POA: Diagnosis not present

## 2013-06-13 DIAGNOSIS — Z7901 Long term (current) use of anticoagulants: Secondary | ICD-10-CM | POA: Diagnosis not present

## 2013-06-13 DIAGNOSIS — I10 Essential (primary) hypertension: Secondary | ICD-10-CM | POA: Diagnosis not present

## 2013-06-13 DIAGNOSIS — E785 Hyperlipidemia, unspecified: Secondary | ICD-10-CM | POA: Diagnosis not present

## 2013-06-13 DIAGNOSIS — I5043 Acute on chronic combined systolic (congestive) and diastolic (congestive) heart failure: Secondary | ICD-10-CM | POA: Diagnosis not present

## 2013-06-13 DIAGNOSIS — I4891 Unspecified atrial fibrillation: Secondary | ICD-10-CM | POA: Diagnosis not present

## 2013-06-13 DIAGNOSIS — I739 Peripheral vascular disease, unspecified: Secondary | ICD-10-CM | POA: Diagnosis not present

## 2013-06-13 DIAGNOSIS — E1129 Type 2 diabetes mellitus with other diabetic kidney complication: Secondary | ICD-10-CM | POA: Diagnosis not present

## 2013-06-13 DIAGNOSIS — N058 Unspecified nephritic syndrome with other morphologic changes: Secondary | ICD-10-CM

## 2013-06-13 DIAGNOSIS — R55 Syncope and collapse: Secondary | ICD-10-CM | POA: Diagnosis not present

## 2013-06-13 DIAGNOSIS — Z794 Long term (current) use of insulin: Secondary | ICD-10-CM | POA: Diagnosis not present

## 2013-06-13 DIAGNOSIS — L97909 Non-pressure chronic ulcer of unspecified part of unspecified lower leg with unspecified severity: Secondary | ICD-10-CM | POA: Diagnosis not present

## 2013-06-13 DIAGNOSIS — Z5189 Encounter for other specified aftercare: Secondary | ICD-10-CM | POA: Diagnosis not present

## 2013-06-13 DIAGNOSIS — M6281 Muscle weakness (generalized): Secondary | ICD-10-CM | POA: Diagnosis not present

## 2013-06-13 DIAGNOSIS — R413 Other amnesia: Secondary | ICD-10-CM

## 2013-06-13 DIAGNOSIS — R279 Unspecified lack of coordination: Secondary | ICD-10-CM | POA: Diagnosis not present

## 2013-06-13 DIAGNOSIS — R269 Unspecified abnormalities of gait and mobility: Secondary | ICD-10-CM | POA: Diagnosis not present

## 2013-06-13 DIAGNOSIS — Z792 Long term (current) use of antibiotics: Secondary | ICD-10-CM | POA: Diagnosis not present

## 2013-06-13 DIAGNOSIS — I129 Hypertensive chronic kidney disease with stage 1 through stage 4 chronic kidney disease, or unspecified chronic kidney disease: Secondary | ICD-10-CM | POA: Diagnosis not present

## 2013-06-13 LAB — URINE CULTURE

## 2013-06-13 LAB — BASIC METABOLIC PANEL
BUN: 20 mg/dL (ref 6–23)
Calcium: 9.1 mg/dL (ref 8.4–10.5)
Creatinine, Ser: 2.19 mg/dL — ABNORMAL HIGH (ref 0.50–1.35)
GFR calc Af Amer: 32 mL/min — ABNORMAL LOW (ref >90)
GFR calc non Af Amer: 28 mL/min — ABNORMAL LOW (ref >90)

## 2013-06-13 LAB — PROTIME-INR
INR: 2.73 — ABNORMAL HIGH (ref 0.00–1.49)
Prothrombin Time: 27.6 seconds — ABNORMAL HIGH (ref 11.6–15.2)

## 2013-06-13 LAB — GLUCOSE, CAPILLARY

## 2013-06-13 MED ORDER — HYDROCODONE-ACETAMINOPHEN 5-325 MG PO TABS: 1.0000 | ORAL_TABLET | Freq: Four times a day (QID) | ORAL | Status: DC | PRN

## 2013-06-13 MED ORDER — ISOSORBIDE MONONITRATE ER 60 MG PO TB24: 60.0000 mg | ORAL_TABLET | Freq: Every day | ORAL | Status: DC

## 2013-06-13 MED ORDER — INSULIN ASPART 100 UNIT/ML ~~LOC~~ SOLN: 3.0000 [IU] | Freq: Three times a day (TID) | SUBCUTANEOUS | Status: DC

## 2013-06-13 MED ORDER — HYDRALAZINE HCL 50 MG PO TABS: 50.0000 mg | ORAL_TABLET | Freq: Three times a day (TID) | ORAL | Status: DC

## 2013-06-13 MED ORDER — WARFARIN SODIUM 5 MG PO TABS: 5.0000 mg | ORAL_TABLET | Freq: Every day | ORAL | Status: DC

## 2013-06-13 MED ORDER — TORSEMIDE 20 MG PO TABS
40.0000 mg | ORAL_TABLET | Freq: Every day | ORAL | Status: DC
  Administered 2013-06-13: 40 mg via ORAL
  Filled 2013-06-13: qty 2

## 2013-06-13 MED ORDER — AMIODARONE HCL 400 MG PO TABS: 400.0000 mg | ORAL_TABLET | Freq: Two times a day (BID) | ORAL | Status: DC

## 2013-06-13 MED ORDER — WARFARIN SODIUM 2.5 MG PO TABS
2.5000 mg | ORAL_TABLET | Freq: Once | ORAL | Status: DC
  Filled 2013-06-13: qty 1

## 2013-06-13 NOTE — Consult Note (Signed)
Wound care follow-up: Santyl applied to left and right leg wounds.  Covered with 4X4 and 4 layer Profore wraps.  Medicated for pain prior to procedure and tolerated without significant pain. Wounds basically unchanged from previous assessment in size or appearance.  Wife at bedside to assess wounds.Plan to change dressings on Fri if still in the hospital at this time. Pt can resume follow-up with outpatient wound care center after discharge. Cammie Mcgee MSN, RN, CWOCN, [place], CNS [phone]

## 2013-06-13 NOTE — Evaluation (Addendum)
Occupational Therapy Evaluation Patient Details Name: Frank Mclean MRN: [id number] DOB: 1941/05/17 Today's Date: 06/13/2013 Time:  - 920-950 co treat    OT Assessment / Plan / Recommendation History of present illness     Clinical Impression   72 year old pt admitted due to near syncope. Pt with balance issues at home and needing more and more help from wife. pt has bilateral unna boots and walked with cane or nothing. Pt has increased trunk sway with functional ambulation and demonstrated safest gait with RW. Pt would benefit from SNF level therapy at discharge to get stronger and imrpoved balance to prevent falls in home setting and improved safety with ADLs. wife is agreeabel to this and pt thinking about it. Wife is supportive of pt.      OT Assessment  Patient needs continued OT Services    Follow Up Recommendations  Home health OT;SNF    Barriers to Discharge      Equipment Recommendations       Recommendations for Other Services    Frequency  Min 2X/week    Precautions / Restrictions Precautions Precautions: Fall Precaution Comments: Pt with bilateral wound dressings on both legs Restrictions Weight Bearing Restrictions: No   Pertinent Vitals/Pain No c/o pain    ADL  Lower Body Dressing: Performed;Minimal assistance (donned shoes) Toilet Transfer: Simulated;Moderate assistance Toilet Transfer Method: Stand pivot Toilet Transfer Equipment: Regular height toilet Toileting - Clothing Manipulation and Hygiene: Performed;Min guard Transfers/Ambulation Related to ADLs: Pt performed sit to stand from regular bed; pt with swaying balance requiring min A to regain balance. Pt overconfident about his balance not seeing his fall risk.. Pt reports walking with a cane at home- gave a cane; pt ambulated down the hallway needing min to mod A to remain safe. Transition to a RW and with verbal cues to remain inside the RW pt able to remain safer and therapist could take hands off of  him.  ADL Comments: Pt is limited by lower leg weakness bilaterally and high fall risk.    OT Diagnosis: Generalized weakness  OT Problem List: Decreased strength;Decreased activity tolerance;Decreased knowledge of use of DME or AE;Decreased safety awareness OT Treatment Interventions: Self-care/ADL training;Therapeutic exercise;Neuromuscular education;DME and/or AE instruction;Cognitive remediation/compensation;Visual/perceptual remediation/compensation;Patient/family education;Therapeutic activities;Balance training   OT Goals(Current goals can be found in the care plan section) Acute Rehab OT Goals Patient Stated Goal: to get back home OT Goal Formulation: With patient  Visit Information  Last OT Received On: 06/12/13 Assistance Needed: +2 PT/OT Co-Evaluation/Treatment: Yes       Prior Functioning     Home Living Available Help at Discharge: Family Type of Home: House Home Access: Stairs to enter [employer] of Steps: 4 Entrance Stairs-Rails: Right Home Layout: Two level;Able to live on main level with bedroom/bathroom Alternate Level Stairs-Number of Steps: doesnt need to go to the basement Communication Communication: No difficulties         Vision/Perception Vision - History Baseline Vision: Wears glasses all the time Patient Visual Report: No change from baseline Vision - Assessment Eye Alignment: Within Functional Limits Perception Perception: Within Functional Limits Praxis Praxis: Intact   Cognition  Cognition Overall Cognitive Status: Within Functional Limits for tasks assessed    Extremity/Trunk Assessment Cervical / Trunk Assessment Cervical / Trunk Assessment: Normal     Mobility Transfers Transfers: Sit to Stand;Stand to Sit Sit to Stand: 4: Min assist Stand to Sit: 4: Min assist Details for Transfer Assistance: verbal cues for safety  Exercise     Balance Balance Balance Assessed: No Static Standing Balance Static  Standing - Level of Assistance: 4: Min assist Dynamic Standing Balance Dynamic Standing - Level of Assistance: 4: Min assist;3: Mod assist   End of Session OT - End of Session Equipment Utilized During Treatment: Gait belt Activity Tolerance: Patient tolerated treatment well Patient left: in chair;with call bell/phone within reach;with family/visitor present  GO     Adan Sis 06/13/2013, 2:13 PM

## 2013-06-13 NOTE — Progress Notes (Signed)
ANTICOAGULATION CONSULT NOTE - Follow Up Consult  Pharmacy Consult for Warfarin Indication: atrial fibrillation  No Known Allergies  Patient Measurements: Height: 6\' 2"  (188 cm) Weight: 203 lb 14.8 oz (92.5 kg) IBW/kg (Calculated) : 82.2  Vital Signs: Temp: 99.9 F (37.7 C) (06/25 0507) Temp src: Oral (06/25 0507) BP: 144/88 mmHg (06/25 0507) Pulse Rate: 69 (06/25 0507)  Labs:  Recent Labs  06/10/13 1535 06/10/13 2101 06/11/13 0229 06/11/13 0400 06/12/13 0400 06/13/13 0350  LABPROT  --   --   --  24.4* 24.2* 27.6*  INR  --   --   --  2.32* 2.29* 2.73*  CREATININE  --   --   --  1.86* 2.12* 2.19*  TROPONINI 0.68* 0.50* 0.46*  --   --   --     Estimated Creatinine Clearance: 34.4 ml/min (by C-G formula based on Cr of 2.19).   Assessment: 72 y.o. M resumed on warfarin from PTA for hx Afib with a therapeutic INR this morning (INR 2.73 << 2.29 << 2.32, goal of 2-3). His INR has increased quickly, likely influenced by a drug interaction with amiodarone (see below).  No CBC today, no s/sx of bleeding noted.  It is noted that the patient started amiodarone on 6/22 and is currently being loaded. Amiodarone has been known to increase warfarin sensitivity (class interaction: D) requiring empiric dose reductions of warfarin by 30-50%. Will continue to monitor this interaction closely.  Goal of Therapy:  INR 2-3   Plan:  1. Warfarin 2.5 mg x 1 dose at 1800 today 2. Will continue to monitor for any signs/symptoms of bleeding and will follow up with PT/INR in the a.m.   Estella Husk, Pharm.D., BCPS Clinical Pharmacist Phone: [phone] or [phone] Pager: [phone] 06/13/2013, 10:34 AM

## 2013-06-13 NOTE — Progress Notes (Signed)
Patient is set to discharge to [facility] today. Patient & wife, Frank Mclean at bedside aware & chose [facility]. Discharge packet provided to wife & she is to transport to SNF.   Clinical Social Work Department CLINICAL SOCIAL WORK PLACEMENT NOTE 06/13/2013  Patient:  Frank Mclean, Frank Mclean  Account Number:  [id number] Admit date:  06/09/2013  Clinical Social Worker:  Theresia Bough, Theresia Majors  Date/time:  06/12/2013 02:13 PM  Clinical Social Work is seeking post-discharge placement for this patient at the following level of care:   SKILLED NURSING   (*CSW will update this form in Epic as items are completed)   06/12/2013  Patient/family provided with [facility] Department of Clinical Social Work's list of facilities offering this level of care within the geographic area requested by the patient (or if unable, by the patient's family).  06/12/2013  Patient/family informed of their freedom to choose among providers that offer the needed level of care, that participate in Medicare, Medicaid or managed care program needed by the patient, have an available bed and are willing to accept the patient.  06/12/2013  Patient/family informed of [facility]' ownership interest in [facility], as well as of the fact that they are under no obligation to receive care at this facility.  PASARR submitted to EDS on 06/12/2013 PASARR number received from EDS on 06/13/2013  FL2 transmitted to all facilities in geographic area requested by pt/family on  06/12/2013 FL2 transmitted to all facilities within larger geographic area on   Patient informed that his/her managed care company has contracts with or will negotiate with  certain facilities, including the following:     Patient/family informed of bed offers received:  06/13/2013 Patient chooses bed at [facility] Physician recommends and patient chooses bed at    Patient to be transferred to [facility] on   06/13/2013 Patient to be transferred to facility by wife's car  The following physician request were entered in Epic:   Additional Comments:   Unice Bailey, LCSW Clinical Social Worker cell #: [phone]

## 2013-06-13 NOTE — Discharge Summary (Signed)
Physician Discharge Summary  Frank Mclean MRN: [id number] DOB/AGE: 06/11/1941 72 y.o.  PCP: [name], MD   Admit date: 06/09/2013 Discharge date: 06/13/2013  Discharge Diagnoses:   :   Near syncope Active Problems:   DM (diabetes mellitus), type 2 with renal complications   HYPERLIPIDEMIA   HYPERTENSION   Acute on chronic combined systolic and diastolic congestive heart failure   CVA   CKD (chronic kidney disease) stage 3, GFR 30-59 ml/min   Wound, open, leg   Atherosclerotic peripheral vascular disease with intermittent claudication   Orthostatic hypotension   Hypokalemia   Elevated troponin   Atrial fibrillation with RVR     Medication List    STOP taking these medications       cloNIDine 0.2 MG tablet  Commonly known as:  CATAPRES             furosemide 40 MG tablet  Commonly known as:  LASIX      TAKE these medications       amiodarone 400 MG tablet  Commonly known as:  PACERONE  Take 1 tablet (400 mg total) by mouth 2 (two) times daily.     amLODipine 10 MG tablet  Commonly known as:  NORVASC  Take 1 tablet (10 mg total) by mouth daily.     atorvastatin 20 MG tablet  Commonly known as:  LIPITOR  Take 20 mg by mouth daily. For cholesterol     BREO ELLIPTA 100-25 MCG/INH Aepb  Generic drug:  Fluticasone Furoate-Vilanterol  Inhale 1 puff into the lungs daily as needed (for breathing problems).     budesonide-formoterol 160-4.5 MCG/ACT inhaler  Commonly known as:  SYMBICORT  Inhale 2 puffs into the lungs daily as needed (for breathing problems).     Cholecalciferol 1000 UNITS tablet  Take 1,000 Units by mouth daily.     collagenase ointment  Commonly known as:  SANTYL  Apply 1 application topically 2 (two) times a week.     cyanocobalamin 1000 MCG tablet  Take 1 tablet (1,000 mcg total) by mouth daily.     glipiZIDE 5 MG tablet  Commonly known as:  GLUCOTROL  Take 2.5 mg by mouth 2 (two) times daily before a meal.     HALLS  COUGH DROPS MT  Use as directed 1 lozenge in the mouth or throat daily as needed (for cough).     hydrALAZINE 50 MG tablet  Commonly known as:  APRESOLINE  Take 1 tablet (50 mg total) by mouth 3 (three) times daily.     HYDROcodone-acetaminophen 5-325 MG per tablet  Commonly known as:  NORCO/VICODIN  Take 1 tablet by mouth every 6 (six) hours as needed for pain.     insulin aspart 100 UNIT/ML injection  Commonly known as:  novoLOG  Inject 3 Units into the skin 3 (three) times daily with meals.     isosorbide mononitrate 60 MG 24 hr tablet  Commonly known as:  IMDUR  Take 1 tablet (60 mg total) by mouth daily.     labetalol 200 MG tablet  Commonly known as:  NORMODYNE  Take 200 mg by mouth 2 (two) times daily.     NITROSTAT 0.4 MG SL tablet  Generic drug:  nitroGLYCERIN  Place 0.4 mg under the tongue every 5 (five) minutes as needed for chest pain.     potassium chloride SA 20 MEQ tablet  Commonly known as:  K-DUR,KLOR-CON  Take 40 mEq by mouth 2 (two) times  daily.     torsemide 100 MG tablet  Commonly known as:  DEMADEX  Take 0.5 tablets (50 mg total) by mouth daily.     triamcinolone cream 0.5 %  Commonly known as:  KENALOG  Apply 1 application topically 2 (two) times daily as needed (for skin breakdown).     warfarin 5 MG tablet  Commonly known as:  COUMADIN  Take 1 tablet (5 mg total) by mouth daily. Takes 5 mg daily, except tues and fri's-takes 7.5mg .    doxycycline 100 MG EC tablet  Commonly known as:  DORYX      Discharge Condition:   Disposition: 01-Home or Self Care   Consults:  Rollene Rotunda, MD   Significant Diagnostic Studies: Dg Chest 2 View  06/09/2013   *RADIOLOGY REPORT*  Clinical Data: Dizziness.  CHEST - 2 VIEW  Comparison: 12/30/2012.  Findings: The heart is enlarged but stable.  The mediastinal and hilar contours are stable.  There is tortuosity and calcification of the thoracic aorta.  The lungs are clear.  No pleural effusion. The bony  thorax is intact.  IMPRESSION: Stable cardiac enlargement but no acute cardiopulmonary findings.   Original Report Authenticated By: Rudie Meyer, M.D.   Ct Head Wo Contrast  06/09/2013   *RADIOLOGY REPORT*  Clinical Data: Dizziness and confusion.  Anticoagulated.  CT HEAD WITHOUT CONTRAST  Technique:  Contiguous axial images were obtained from the base of the skull through the vertex without contrast.  Comparison: None.  Findings: There is diffuse patchy low density throughout the subcortical and periventricular white matter consistent with chronic small vessel ischemic change.  Chronic right thalamic lacunar infarct noted.  There is prominence of the sulci and ventricles consistent with brain atrophy.  There is no evidence for acute brain infarct, hemorrhage or mass.  The paranasal sinuses and mastoid air cells are clear.  The skull is intact.  IMPRESSION:  1.  No acute intracranial abnormalities. 2.  Small vessel ischemic change and brain atrophy.   Original Report Authenticated By: Signa Kell, M.D.      Microbiology: No results found for this or any previous visit (from the past 240 hour(s)).   Labs: Results for orders placed during the hospital encounter of 06/09/13 (from the past 48 hour(s))  GLUCOSE, CAPILLARY     Status: Abnormal   Collection Time    06/11/13 12:12 PM      Result Value Range   Glucose-Capillary 119 (*) 70 - 99 mg/dL  GLUCOSE, CAPILLARY     Status: None   Collection Time    06/11/13  4:40 PM      Result Value Range   Glucose-Capillary 80  70 - 99 mg/dL  GLUCOSE, CAPILLARY     Status: Abnormal   Collection Time    06/11/13  8:37 PM      Result Value Range   Glucose-Capillary 105 (*) 70 - 99 mg/dL  PROTIME-INR     Status: Abnormal   Collection Time    06/12/13  4:00 AM      Result Value Range   Prothrombin Time 24.2 (*) 11.6 - 15.2 seconds   INR 2.29 (*) 0.00 - 1.49  BASIC METABOLIC PANEL     Status: Abnormal   Collection Time    06/12/13  4:00 AM       Result Value Range   Sodium 149 (*) 135 - 145 mEq/L   Potassium 3.6  3.5 - 5.1 mEq/L   Chloride 106  96 - 112  mEq/L   CO2 30  19 - 32 mEq/L   Glucose, Bld 87  70 - 99 mg/dL   BUN 18  6 - 23 mg/dL   Creatinine, Ser 9.52 (*) 0.50 - 1.35 mg/dL   Calcium 9.3  8.4 - 84.1 mg/dL   GFR calc non Af Amer 29 (*) >90 mL/min   GFR calc Af Amer 34 (*) >90 mL/min   Comment:            The eGFR has been calculated     using the CKD EPI equation.     This calculation has not been     validated in all clinical     situations.     eGFR's persistently     <90 mL/min signify     possible Chronic Kidney Disease.  GLUCOSE, CAPILLARY     Status: Abnormal   Collection Time    06/12/13  8:23 AM      Result Value Range   Glucose-Capillary 143 (*) 70 - 99 mg/dL  GLUCOSE, CAPILLARY     Status: None   Collection Time    06/12/13 12:13 PM      Result Value Range   Glucose-Capillary 79  70 - 99 mg/dL   Comment 1 Notify RN    URINALYSIS, ROUTINE W REFLEX MICROSCOPIC     Status: None   Collection Time    06/12/13  1:09 PM      Result Value Range   Color, Urine YELLOW  YELLOW   APPearance CLEAR  CLEAR   Specific Gravity, Urine 1.016  1.005 - 1.030   pH 5.5  5.0 - 8.0   Glucose, UA NEGATIVE  NEGATIVE mg/dL   Hgb urine dipstick NEGATIVE  NEGATIVE   Bilirubin Urine NEGATIVE  NEGATIVE   Ketones, ur NEGATIVE  NEGATIVE mg/dL   Protein, ur NEGATIVE  NEGATIVE mg/dL   Urobilinogen, UA 0.2  0.0 - 1.0 mg/dL   Nitrite NEGATIVE  NEGATIVE   Leukocytes, UA NEGATIVE  NEGATIVE   Comment: MICROSCOPIC NOT DONE ON URINES WITH NEGATIVE PROTEIN, BLOOD, LEUKOCYTES, NITRITE, OR GLUCOSE <1000 mg/dL.  GLUCOSE, CAPILLARY     Status: None   Collection Time    06/12/13  4:58 PM      Result Value Range   Glucose-Capillary 90  70 - 99 mg/dL   Comment 1 Notify RN    GLUCOSE, CAPILLARY     Status: None   Collection Time    06/12/13  8:38 PM      Result Value Range   Glucose-Capillary 79  70 - 99 mg/dL   Comment 1 Notify RN     PROTIME-INR     Status: Abnormal   Collection Time    06/13/13  3:50 AM      Result Value Range   Prothrombin Time 27.6 (*) 11.6 - 15.2 seconds   INR 2.73 (*) 0.00 - 1.49  BASIC METABOLIC PANEL     Status: Abnormal   Collection Time    06/13/13  3:50 AM      Result Value Range   Sodium 146 (*) 135 - 145 mEq/L   Potassium 3.2 (*) 3.5 - 5.1 mEq/L   Chloride 103  96 - 112 mEq/L   CO2 32  19 - 32 mEq/L   Glucose, Bld 98  70 - 99 mg/dL   BUN 20  6 - 23 mg/dL   Creatinine, Ser 3.24 (*) 0.50 - 1.35 mg/dL   Calcium 9.1  8.4 -  10.5 mg/dL   GFR calc non Af Amer 28 (*) >90 mL/min   GFR calc Af Amer 32 (*) >90 mL/min   Comment:            The eGFR has been calculated     using the CKD EPI equation.     This calculation has not been     validated in all clinical     situations.     eGFR's persistently     <90 mL/min signify     possible Chronic Kidney Disease.  GLUCOSE, CAPILLARY     Status: Abnormal   Collection Time    06/13/13  7:52 AM      Result Value Range   Glucose-Capillary 142 (*) 70 - 99 mg/dL   Comment 1 Documented in Chart     Comment 2 Notify RN       HPI :72 y.o. male with no history of CAD. He has a history of NICM. He came in with presyncope, had an elevated troponin, and cardiology was asked to evaluate him.  Pt weighs himself daily. Yesterday, his weight was up and his wife gave him Lasix 20 mg. Today his weight was back down. He was having problems this am with confusion and possible hallucinations. He was walking down the hallway and had near-syncope with a near-fall. He slid down the wall and denies injuries. He did not completely lose consciousness. He eventually improved enough to get up and was very weak so he came to the hospital.  In the ER, his BP was initially low and his orthostatics were positive. He was given 500 cc IVF and feels better. At no time did he have any chest pain. His potassium was low and was supplemented with 40 meq. His mental status is  currently good and he denies any pain or SOB.     HOSPITAL COURSE:  Atrial fibrillation with RVR with positive troponin- rate controlled now  Thought to be the cause of his syncope  - started on amiodarone this admission, cardiology following  change to 400 mg bid amiodarone Continue anticoagulation with Coumadin   Near-syncope : Possibly due to atrial fibrillation paroxysmal and orthostatic hypotension, Also troponins positive ? Troponin leak vs NSTEMI  - Per cardiology: no further ischemia workup   Diabetes mellitus  -Hemoglobin A1c 6.1, Continue glipizide.  -Sensitive sliding scale while in the hospital.   Chronic combined CHF  -Appears compensated at this moment, cardiology following. Continue torsemide and recheck BMP weekly   Chronic kidney disease stage III  -Creatinine trended upto 2.1 today, on torsemide, cardiology following,baseline around 2.0   Chronic bilateral lower extremity wounds with peripheral vascular disease  -Is followed chronically by the wound care clinic as well as Dr. Darrick Penna with vascular surgery.  -He currently has unnaboots on that were placed yesterday. The wife describes that he gets these placed 3 times a week on Monday Wednesdays and Fridays.  -Is chronically on doxycycline which we will continue in the hospital. Wound care consulted and have seen the patient.  Leg wounds  He has  Full thickness ulcer to medial left lower leg  Measurement: 10cm X 5.5cm X 0.2cm  Wound bed:40% yellow, 60% red, no odor Full thickness ulcer to later right lower leg  Measurement: 5.5cm X 4cm X 0.2cm Prior to admission using santyl ointment to wounds and wrapping with Profore dressing. Will continue this current treatment, to be changed Monday, Wednesday, Friday patient should follow up with [facility]ound [facility]  History of CVA  -Is maintained on anticoagulation with Coumadin.  -INR is therapeutic, pharmacy to dose Coumadin.  2.5 mg today Needs daily adjustment with  daily INR    Discharge Exam:  Blood pressure 144/88, pulse 69, temperature 99.9 F (37.7 C), temperature source Oral, resp. rate 20, height 6\' 2"  (1.88 m), weight 92.5 kg (203 lb 14.8 oz), SpO2 94.00%.   General: No distress  Lungs: Clear  Heart: RRR  Abdomen: Positive bowel sounds, no rebound no guarding  Extremities: No edema.           Future Appointments Provider Department Dept Phone   07/05/2013 2:45 PM Sherren Kerns, MD [facility] [phone]        Signed: Richarda Overlie 06/13/2013, 11:17 AM

## 2013-06-13 NOTE — Progress Notes (Signed)
SUBJECTIVE:  No acute complaints.  He denies SOB or pain.    PHYSICAL EXAM Filed Vitals:   06/12/13 2028 06/12/13 2031 06/12/13 2034 06/13/13 0507  BP: 136/84 119/81 116/72 144/88  Pulse: 70 70 70 69  Temp:    99.9 F (37.7 C)  TempSrc:    Oral  Resp: 20 20 20 20   Height:      Weight:    203 lb 14.8 oz (92.5 kg)  SpO2: 98% 99% 97% 94%   General:  No distress Lungs:  Clear Heart:  RRR Abdomen:  Positive bowel sounds, no rebound no guarding Extremities:  No edema.   LABS:  Results for orders placed during the hospital encounter of 06/09/13 (from the past 24 hour(s))  GLUCOSE, CAPILLARY     Status: Abnormal   Collection Time    06/12/13  8:23 AM      Result Value Range   Glucose-Capillary 143 (*) 70 - 99 mg/dL  GLUCOSE, CAPILLARY     Status: None   Collection Time    06/12/13 12:13 PM      Result Value Range   Glucose-Capillary 79  70 - 99 mg/dL   Comment 1 Notify RN    URINALYSIS, ROUTINE W REFLEX MICROSCOPIC     Status: None   Collection Time    06/12/13  1:09 PM      Result Value Range   Color, Urine YELLOW  YELLOW   APPearance CLEAR  CLEAR   Specific Gravity, Urine 1.016  1.005 - 1.030   pH 5.5  5.0 - 8.0   Glucose, UA NEGATIVE  NEGATIVE mg/dL   Hgb urine dipstick NEGATIVE  NEGATIVE   Bilirubin Urine NEGATIVE  NEGATIVE   Ketones, ur NEGATIVE  NEGATIVE mg/dL   Protein, ur NEGATIVE  NEGATIVE mg/dL   Urobilinogen, UA 0.2  0.0 - 1.0 mg/dL   Nitrite NEGATIVE  NEGATIVE   Leukocytes, UA NEGATIVE  NEGATIVE  GLUCOSE, CAPILLARY     Status: None   Collection Time    06/12/13  4:58 PM      Result Value Range   Glucose-Capillary 90  70 - 99 mg/dL   Comment 1 Notify RN    GLUCOSE, CAPILLARY     Status: None   Collection Time    06/12/13  8:38 PM      Result Value Range   Glucose-Capillary 79  70 - 99 mg/dL   Comment 1 Notify RN    PROTIME-INR     Status: Abnormal   Collection Time    06/13/13  3:50 AM      Result Value Range   Prothrombin Time 27.6 (*) 11.6  - 15.2 seconds   INR 2.73 (*) 0.00 - 1.49  BASIC METABOLIC PANEL     Status: Abnormal   Collection Time    06/13/13  3:50 AM      Result Value Range   Sodium 146 (*) 135 - 145 mEq/L   Potassium 3.2 (*) 3.5 - 5.1 mEq/L   Chloride 103  96 - 112 mEq/L   CO2 32  19 - 32 mEq/L   Glucose, Bld 98  70 - 99 mg/dL   BUN 20  6 - 23 mg/dL   Creatinine, Ser 1.61 (*) 0.50 - 1.35 mg/dL   Calcium 9.1  8.4 - 09.6 mg/dL   GFR calc non Af Amer 28 (*) >90 mL/min   GFR calc Af Amer 32 (*) >90 mL/min    Intake/Output Summary (Last  24 hours) at 06/13/13 0806 Last data filed at 06/13/13 0500  Gross per 24 hour  Intake    600 ml  Output   1350 ml  Net   -750 ml     ASSESSMENT AND PLAN:  ATRIAL FIBRILLATION:  On warfarin, continue amiodarone.  When he is discharged change to 400 mg bid amiodarone.   NEAR SYNCOPE: No further symptoms.    ACUTE ON CHRONIC SYSTOLIC AND DIASTOLIC HF:  As above.    ELEVATED TROPONIN:  No suggestion of ischemia.  Probably related to volume and arrhythmia.  No further ischemia work up.    CKD:  Creatinine has trended up a little.   Reduce Demadex.      Fayrene Fearing [facility] 06/13/2013 8:06 AM

## 2013-06-14 ENCOUNTER — Other Ambulatory Visit: Payer: Self-pay | Admitting: Family Medicine

## 2013-06-14 ENCOUNTER — Encounter: Payer: Self-pay | Admitting: Family Medicine

## 2013-06-14 ENCOUNTER — Non-Acute Institutional Stay: Payer: Self-pay | Admitting: Family Medicine

## 2013-06-14 DIAGNOSIS — N400 Enlarged prostate without lower urinary tract symptoms: Secondary | ICD-10-CM

## 2013-06-14 DIAGNOSIS — R413 Other amnesia: Secondary | ICD-10-CM

## 2013-06-14 DIAGNOSIS — I951 Orthostatic hypotension: Secondary | ICD-10-CM

## 2013-06-14 DIAGNOSIS — I70219 Atherosclerosis of native arteries of extremities with intermittent claudication, unspecified extremity: Secondary | ICD-10-CM

## 2013-06-14 DIAGNOSIS — E1129 Type 2 diabetes mellitus with other diabetic kidney complication: Secondary | ICD-10-CM

## 2013-06-14 DIAGNOSIS — I872 Venous insufficiency (chronic) (peripheral): Secondary | ICD-10-CM

## 2013-06-14 DIAGNOSIS — I4891 Unspecified atrial fibrillation: Secondary | ICD-10-CM

## 2013-06-14 LAB — GLUCOSE, CAPILLARY: Glucose-Capillary: 114 mg/dL — ABNORMAL HIGH (ref 70–99)

## 2013-06-14 NOTE — Assessment & Plan Note (Signed)
Recurrent syncope, will recheck orthostatics at [facility] with reduced antihypertensives.

## 2013-06-14 NOTE — Progress Notes (Signed)
Subjective:    Patient ID: Frank Mclean, male    DOB: 1940-12-22, 72 y.o.   MRN: [id number]  Nursing Home H&P HPI   Patient recently hospitalized due to recurrent syncope.  Has history of non-ischemic CM with EF of 20-25% on last ECHO.  During hospitalization his clonidine was discontinued and his other antihypertensives were decreased.  He reports that his lightheadedness has improved but is still present if trying to stand up too fast. He plans to stay short term at [facility] for rehabilitation before returning to his home in [place].   Chronic venous stasis ulcers - Seen weekly at the wound care center with 2 other dressing changes be home health each week. He also reports a skin injury from a pallet falling on his left ankle a few weeks ago. Takes Vicodin a couple times weekly for pain.   PAD - being followed by Dr Darrick Penna for slow healing on LLE wound and angioplasty being considered for the mid and distal tibial artery. A wound Vac was recommended for the left side.   Memory - he denies confusion during the hospitalization but his wife had reported to the doctors that he was seeing things.    Past Medical History  Diagnosis Date  . Gout   . HTN (hypertension)   . Renal insufficiency   . Hyperlipidemia   . Osteoarthritis   . History of CVA (cerebrovascular accident)     Left pontine infarct July 2004; changed from Plavix to Coumadin in 2004 per MD at [facility]  . GERD (gastroesophageal reflux disease)   . DJD (degenerative joint disease)   . BPH (benign prostatic hypertrophy)   . Cardiomyopathy, hypertensive     NON-ISCHEMIC  . CAD (coronary artery disease) CARDIOLOGIST-  DR HOCHREIN    Non obstructive  . DM (diabetes mellitus), type 2   . Shortness of breath   . Peripheral vascular disease   . LBBB (left bundle branch block)   . Bilateral leg ulcer     ACHILLES AREA--  NONHEALING  . Left ventricular diastolic dysfunction, NYHA class 3   . Cardiac LV ejection fraction  21-40%     25-30%  PER ECHO 1/14  . Systolic and diastolic CHF, acute on chronic     EF 25% PER ECHO 1/14   Past Surgical History  Procedure Laterality Date  . Cardiac catheterization  04-16-2011   DR [facility]    NON-OBSTRUCTIVE CAD. MILDLY ELEVATED PULMONARY PRESSURES/ ELEVATED  END-DIASTOLIC PRESSURE  . Transthoracic echocardiogram  12-31-2010    MODERATE CONCENTRIC LVH/ SYSTOLIC FUNCTION SEVERELY REDUCED/ EF 25-30%/  SEVERE HYPOKINESIS OF ANTEROSEPTAL MYOCARDIUM  AND ENTIREAPICAL MYOCARDIUM /  MODERATE HYPOKINESIS OF LATERAL, INFEROLATERAL, INFERIOR,AND INFEROSEPTAL MYOCARIUM/  GRADE 3 DIASTOLIC DYSFUNCTION/ MILD MR  . Aortogram w/ bilateral lower extremitiy runoff  05-18-2013  DR FIELDS    LEFT LEG OCCLUDED PERONEAL AND ANTERIOR TIBIAL ARTERIES/ HIGH GRADE STENOIS 80% MIDDLE AND DISTAL THIRD OF POSTERIOR TIBIAL ARTERY/ RIGHT PERONEAL AND ANTERIOR TIBIAL ARTERY OCCLUDED/ 40% STENOSIS DISTALLY    Family History  Problem Relation Age of Onset  . Hypertension Mother   . Heart disease Mother   . Diabetes Mother   . Gout Other   . Stroke Other   . Hypertension Father   . Heart disease Father     History   Social History Narrative   Worked at [employer] year.  He is married and lives with his wife Frank Mclean in [place].  Has one daughter Frank Mclean  No Known Allergies   Review of Systems Per HPI    Objective:   Physical Exam  Constitutional: He is oriented to person, place, and time. He appears well-nourished. No distress.  HENT:  Head: Normocephalic and atraumatic.  Eyes: No scleral icterus.  Neck: Neck supple.  Cardiovascular: Normal rate, regular rhythm and normal heart sounds.   Pulmonary/Chest: Effort normal and breath sounds normal. No respiratory distress. He has no wheezes.  Abdominal: Soft. Bowel sounds are normal. He exhibits no distension. There is no tenderness.  Musculoskeletal: He exhibits no edema.  Legs wrapped bilaterally with unna boot and coban.    Neurological: He is alert and oriented to person, place, and time.          Assessment & Plan:

## 2013-06-14 NOTE — Assessment & Plan Note (Signed)
Medicines being adjusted to balance this with [facility]

## 2013-06-14 NOTE — Assessment & Plan Note (Signed)
Potassium being replaced.

## 2013-06-14 NOTE — Assessment & Plan Note (Signed)
Amiodarone increased during recent hospitalization due to RVR.   Rate controlled currently.  Will need to use amiodarone with caution given his baseline lung disease with COPD

## 2013-06-14 NOTE — Assessment & Plan Note (Signed)
Continue glipizide 

## 2013-06-14 NOTE — Assessment & Plan Note (Signed)
Venous stasis with ulceration.  Using unna boot and followed by wound care.

## 2013-06-14 NOTE — Assessment & Plan Note (Signed)
Well controlled by recent A1c

## 2013-06-14 NOTE — Assessment & Plan Note (Addendum)
Completed high school.  MMSE with 30/30, able to recall three objects without difficulty.

## 2013-06-14 NOTE — Assessment & Plan Note (Signed)
Currently well compensated

## 2013-06-14 NOTE — Assessment & Plan Note (Signed)
Followed by Dr. Darrick Penna, being considered for angioplasty of mid and distal tibial artery.

## 2013-06-14 NOTE — Assessment & Plan Note (Signed)
Be cautious of medications that may contribute to urinary retention.

## 2013-06-14 NOTE — Assessment & Plan Note (Signed)
Noted as a problem in the past. Hospital chart suggests some delirium while there.

## 2013-06-14 NOTE — Progress Notes (Signed)
  Subjective:    Patient ID: Frank Mclean, male    DOB: 1941-06-09, 72 y.o.   MRN: [id number]  HPI Orthostatic hypotension - had caused falls without injury prior to the hospitalization. Has non-ischemic cardiomyopathy with EF 20-25%. Atrial  Recently added Clonidine was stopped and other blood pressure meds decreased.  Weakness - admitted to [facility] for rehab  Chronic venous stasis ulcers - Seen weekly at the wound care center with 2 other dressing changes be home health each week. He also reports a skin injury from a pallet falling on his  left ankle a few weeks ago. Takes Vicodin a couple times weekly for pain.   PAD - being followed by Dr Darrick Penna for slow healing on LLE wound and angioplasty being considered for the mid and distal tibial artery. A wound Vac was recommended for the left side.   Memory - he denies confusion during the hospitalization but his wife had reported to the doctors that he was seeing things.   DIABETES MELLITUS - recent A1c was 6.1 Review of Systems     Objective:   Physical Exam  Constitutional: He appears well-developed.  Cardiovascular: Normal rate and regular rhythm.   No murmur heard. Pulmonary/Chest: Effort normal and breath sounds normal.  Neurological: He is alert. No cranial nerve deficit.  Registers 3 words, recalls 1 Oriented to date  He reports normal sensation to soft touch.    Skin:  Dressings covered with Coban on both lower legs          Assessment & Plan:

## 2013-06-29 ENCOUNTER — Non-Acute Institutional Stay: Payer: Self-pay | Admitting: Family Medicine

## 2013-06-29 ENCOUNTER — Non-Acute Institutional Stay: Payer: Medicare Other | Admitting: Family Medicine

## 2013-06-29 DIAGNOSIS — I10 Essential (primary) hypertension: Secondary | ICD-10-CM

## 2013-06-29 DIAGNOSIS — I2510 Atherosclerotic heart disease of native coronary artery without angina pectoris: Secondary | ICD-10-CM

## 2013-06-29 DIAGNOSIS — I951 Orthostatic hypotension: Secondary | ICD-10-CM | POA: Diagnosis not present

## 2013-06-29 DIAGNOSIS — S81809D Unspecified open wound, unspecified lower leg, subsequent encounter: Secondary | ICD-10-CM

## 2013-06-29 DIAGNOSIS — L98499 Non-pressure chronic ulcer of skin of other sites with unspecified severity: Secondary | ICD-10-CM

## 2013-06-29 DIAGNOSIS — I872 Venous insufficiency (chronic) (peripheral): Secondary | ICD-10-CM

## 2013-06-29 DIAGNOSIS — Z5189 Encounter for other specified aftercare: Secondary | ICD-10-CM | POA: Diagnosis not present

## 2013-06-29 DIAGNOSIS — E1129 Type 2 diabetes mellitus with other diabetic kidney complication: Secondary | ICD-10-CM

## 2013-06-29 DIAGNOSIS — I739 Peripheral vascular disease, unspecified: Secondary | ICD-10-CM

## 2013-06-29 DIAGNOSIS — I5043 Acute on chronic combined systolic (congestive) and diastolic (congestive) heart failure: Secondary | ICD-10-CM

## 2013-06-29 DIAGNOSIS — I4891 Unspecified atrial fibrillation: Secondary | ICD-10-CM

## 2013-06-29 DIAGNOSIS — N058 Unspecified nephritic syndrome with other morphologic changes: Secondary | ICD-10-CM

## 2013-06-29 MED ORDER — POTASSIUM CHLORIDE CRYS ER 20 MEQ PO TBCR: 40.0000 meq | EXTENDED_RELEASE_TABLET | Freq: Two times a day (BID) | ORAL | Status: DC

## 2013-06-29 MED ORDER — WARFARIN SODIUM 5 MG PO TABS: 5.0000 mg | ORAL_TABLET | Freq: Every day | ORAL | Status: DC

## 2013-06-29 MED ORDER — DOXYCYCLINE HYCLATE 100 MG PO CAPS: 100.0000 mg | ORAL_CAPSULE | Freq: Every day | ORAL | Status: DC

## 2013-06-29 MED ORDER — GLIPIZIDE 5 MG PO TABS: 2.5000 mg | ORAL_TABLET | Freq: Two times a day (BID) | ORAL | Status: DC

## 2013-06-29 MED ORDER — AMLODIPINE BESYLATE 10 MG PO TABS: 10.0000 mg | ORAL_TABLET | Freq: Every day | ORAL | Status: DC

## 2013-06-29 MED ORDER — CHOLECALCIFEROL 25 MCG (1000 UT) PO TABS: 1000.0000 [IU] | ORAL_TABLET | Freq: Every day | ORAL | Status: DC

## 2013-06-29 MED ORDER — HYDRALAZINE HCL 50 MG PO TABS: 50.0000 mg | ORAL_TABLET | Freq: Three times a day (TID) | ORAL | Status: DC

## 2013-06-29 MED ORDER — INSULIN ASPART 100 UNIT/ML ~~LOC~~ SOLN: 3.0000 [IU] | Freq: Three times a day (TID) | SUBCUTANEOUS | Status: DC

## 2013-06-29 MED ORDER — NITROGLYCERIN 0.4 MG SL SUBL: 0.4000 mg | SUBLINGUAL_TABLET | SUBLINGUAL | Status: DC | PRN

## 2013-06-29 MED ORDER — AMIODARONE HCL 400 MG PO TABS: 400.0000 mg | ORAL_TABLET | Freq: Two times a day (BID) | ORAL | Status: DC

## 2013-06-29 MED ORDER — BUDESONIDE-FORMOTEROL FUMARATE 160-4.5 MCG/ACT IN AERO: 2.0000 | INHALATION_SPRAY | Freq: Two times a day (BID) | RESPIRATORY_TRACT | Status: DC

## 2013-06-29 MED ORDER — LABETALOL HCL 200 MG PO TABS: 200.0000 mg | ORAL_TABLET | Freq: Two times a day (BID) | ORAL | Status: DC

## 2013-06-29 MED ORDER — CYANOCOBALAMIN 1000 MCG PO TABS: 1000.0000 ug | ORAL_TABLET | Freq: Every day | ORAL | Status: DC

## 2013-06-29 MED ORDER — HYDROCODONE-ACETAMINOPHEN 5-325 MG PO TABS: 1.0000 | ORAL_TABLET | Freq: Four times a day (QID) | ORAL | Status: DC | PRN

## 2013-06-29 MED ORDER — TRIAMCINOLONE ACETONIDE 0.5 % EX CREA: 1.0000 "application " | TOPICAL_CREAM | Freq: Two times a day (BID) | CUTANEOUS | Status: DC | PRN

## 2013-06-29 MED ORDER — ISOSORBIDE MONONITRATE ER 60 MG PO TB24: 60.0000 mg | ORAL_TABLET | Freq: Every day | ORAL | Status: DC

## 2013-06-29 MED ORDER — TORSEMIDE 100 MG PO TABS: 50.0000 mg | ORAL_TABLET | Freq: Every day | ORAL | Status: DC

## 2013-06-29 MED ORDER — ATORVASTATIN CALCIUM 20 MG PO TABS: 20.0000 mg | ORAL_TABLET | Freq: Every day | ORAL | Status: DC

## 2013-06-30 ENCOUNTER — Telehealth: Payer: Self-pay | Admitting: Family Medicine

## 2013-06-30 NOTE — Telephone Encounter (Signed)
After hours telephone call:  Called by [facility] with INR result of 3.7. Patient is on 5mg  daily. He is currently on Doxy antibiotic and prior INR was 5.0. No signs of bleeding Patient is being discharged today. He should hold Coumadin tonight and tomorrow, then restart at 5mg .  He will need INR check next week at PCP office.  Nurse agrees and will pass along information to the patient and his family.  Amber M. Hairford, M.D. 06/30/2013 10:17 AM

## 2013-07-01 DIAGNOSIS — S91009A Unspecified open wound, unspecified ankle, initial encounter: Secondary | ICD-10-CM | POA: Diagnosis not present

## 2013-07-01 DIAGNOSIS — Z9181 History of falling: Secondary | ICD-10-CM | POA: Diagnosis not present

## 2013-07-01 DIAGNOSIS — I509 Heart failure, unspecified: Secondary | ICD-10-CM | POA: Diagnosis not present

## 2013-07-01 DIAGNOSIS — I10 Essential (primary) hypertension: Secondary | ICD-10-CM | POA: Diagnosis not present

## 2013-07-01 DIAGNOSIS — I872 Venous insufficiency (chronic) (peripheral): Secondary | ICD-10-CM | POA: Diagnosis not present

## 2013-07-01 DIAGNOSIS — S81009A Unspecified open wound, unspecified knee, initial encounter: Secondary | ICD-10-CM | POA: Diagnosis not present

## 2013-07-01 DIAGNOSIS — E119 Type 2 diabetes mellitus without complications: Secondary | ICD-10-CM | POA: Diagnosis not present

## 2013-07-01 NOTE — Progress Notes (Signed)
[facility] Discharge Summary  Patient name: Frank Mclean Medical record number: [id number] Date of birth: 1941-12-01 Age: 72 y.o. Gender: male Date of Admission: 06/13/13  Date of Discharge: 06/30/13 Admitting Physician: Dr. Zachery Dauer Primary Care Provider: [name], MD Consultants: Physical Therapy  Indication for Rehab: Rehabilitation for syncopal episodes and weakness  Discharge Diagnoses/Problem List:  1. Orthostatic Hypotension 2. Weakness 3. Chronic Venous stasis ulcers 4. Peripheral Artery Disease 5. Diabetes Mellitus Type 2 6. Atrial Fibrillation 7. Chronic Combined systolic and diastolic heart failure  Disposition: home  Discharge Condition: improved  Brief Rehab Course:  1. Orthostatic Hypotension: was recently hospitalized for recurrent syncope. During his hospitalization, clonidine was discontinued and his other antihypertensives were decreased. At [facility], he received BP medications on staggered schedule which appeared to help with light headedness while standing. Orthostatics on week prior to discharge were normal.  2. Weakness: made good progress with physical therapy 3. Chronic Venous stasis ulcers: was seen by wound care while at [facility] with dressing changes of UnaBoots. Patient to resume wound care (twice a week at home and once a week on Wednesdays at the wound center). Patient remained on doxycycline for chronic wounds.  4. Peripheral Artery Disease: being followed by Dr Darrick Penna for slow healing on LLE wound and angioplasty being considered for the mid and distal tibial artery. A wound Vac was recommended for the left side.  5. Diabetes Mellitus Type 2: well controled with A1C of 6.1 on 06/09/13. Continued on glipizide 2.5mg  bid and novolog 3units at mealtime.  6. Atrial Fibrillation: remained on coumadin (see follow up items for notes on coumadin adjustments).  7. Chronic Combined systolic and diastolic heart failure: EF of 47-42% on last  ECHO. compensated. clonidine discontinued and rest of medications adjusted for orthostatic hypotension.  Issues for Follow Up:  - INR: fluctuating while patient on doxycycline. Latest INR: 3.7 (06/29/13). Coumadin held on 7/12 and 7/13 with recheck on 7/14.  - Doxycycline: duration of therapy to be determined at Wound Center - potassium level  Significant Procedures: none  Significant Labs and Imaging:  CMP     Component Value Date/Time   NA 146* 06/13/2013 0350   K 3.2* 06/13/2013 0350   CL 103 06/13/2013 0350   CO2 32 06/13/2013 0350   GLUCOSE 98 06/13/2013 0350   BUN 20 06/13/2013 0350   CREATININE 2.19* 06/13/2013 0350   CALCIUM 9.1 06/13/2013 0350   PROT 5.8* 04/15/2011 0105   ALBUMIN 3.2* 04/15/2011 0105   AST 13 04/15/2011 0105   ALT 12 04/15/2011 0105   ALKPHOS 87 04/15/2011 0105   BILITOT 0.7 04/15/2011 0105   GFRNONAA 28* 06/13/2013 0350   GFRAA 32* 06/13/2013 0350     Outstanding Results: none  Discharge Medications:    Medication List       This list is accurate as of: 06/29/13 11:59 PM.  Always use your most recent med list.               amiodarone 400 MG tablet  Commonly known as:  PACERONE  Take 1 tablet (400 mg total) by mouth 2 (two) times daily.     amLODipine 10 MG tablet  Commonly known as:  NORVASC  Take 1 tablet (10 mg total) by mouth daily.     atorvastatin 20 MG tablet  Commonly known as:  LIPITOR  Take 1 tablet (20 mg total) by mouth daily. For cholesterol     BREO ELLIPTA 100-25 MCG/INH Aepb  Generic drug:  Fluticasone Furoate-Vilanterol  Inhale 1 puff into the lungs daily as needed (for breathing problems).     budesonide-formoterol 160-4.5 MCG/ACT inhaler  Commonly known as:  SYMBICORT  Inhale 2 puffs into the lungs 2 (two) times daily.     Cholecalciferol 1000 UNITS tablet  Take 1 tablet (1,000 Units total) by mouth daily.     collagenase ointment  Commonly known as:  SANTYL  Apply 1 application topically 2 (two) times a week.      cyanocobalamin 1000 MCG tablet  Take 1 tablet (1,000 mcg total) by mouth daily.     doxycycline 100 MG capsule  Commonly known as:  VIBRAMYCIN  Take 1 capsule (100 mg total) by mouth daily.     glipiZIDE 5 MG tablet  Commonly known as:  GLUCOTROL  Take 0.5 tablets (2.5 mg total) by mouth 2 (two) times daily before a meal.     HALLS COUGH DROPS MT  Use as directed 1 lozenge in the mouth or throat daily as needed (for cough).     hydrALAZINE 50 MG tablet  Commonly known as:  APRESOLINE  Take 1 tablet (50 mg total) by mouth 3 (three) times daily.     HYDROcodone-acetaminophen 5-325 MG per tablet  Commonly known as:  NORCO/VICODIN  Take 1 tablet by mouth every 6 (six) hours as needed for pain.     insulin aspart 100 UNIT/ML injection  Commonly known as:  novoLOG  Inject 3 Units into the skin 3 (three) times daily with meals.     isosorbide mononitrate 60 MG 24 hr tablet  Commonly known as:  IMDUR  Take 1 tablet (60 mg total) by mouth daily.     labetalol 200 MG tablet  Commonly known as:  NORMODYNE  Take 1 tablet (200 mg total) by mouth 2 (two) times daily.     nitroGLYCERIN 0.4 MG SL tablet  Commonly known as:  NITROSTAT  Place 1 tablet (0.4 mg total) under the tongue every 5 (five) minutes as needed for chest pain.     potassium chloride SA 20 MEQ tablet  Commonly known as:  K-DUR,KLOR-CON  Take 2 tablets (40 mEq total) by mouth 2 (two) times daily.     torsemide 100 MG tablet  Commonly known as:  DEMADEX  Take 0.5 tablets (50 mg total) by mouth daily.     triamcinolone cream 0.5 %  Commonly known as:  KENALOG  Apply 1 application topically 2 (two) times daily as needed (for skin breakdown).     warfarin 5 MG tablet  Commonly known as:  COUMADIN  Take 1 tablet (5 mg total) by mouth daily. Takes 5 mg daily starting Sunday 7/13, then recheck next week        Discharge Instructions: Patient was counseled important signs and symptoms that should prompt return to  medical care, changes in medications, dietary instructions, activity restrictions, and follow up appointments.   Follow-Up Appointments: - follow up with Dr. Posey Rea week of 07/02/13.  Lonia Skinner, MD 07/01/2013, 10:09 PM PGY-3, [facility]

## 2013-07-02 NOTE — Assessment & Plan Note (Signed)
well controlled  

## 2013-07-02 NOTE — Assessment & Plan Note (Signed)
Healing with compression per report

## 2013-07-02 NOTE — Progress Notes (Signed)
Patient ID: Frank Mclean, male   DOB: 01/19/1941, 72 y.o.   MRN: [id number] I interviewed and examined this patient and discussed the discharge plan with Dr Gwenlyn Saran.

## 2013-07-02 NOTE — Assessment & Plan Note (Signed)
resolved 

## 2013-07-04 ENCOUNTER — Encounter (HOSPITAL_BASED_OUTPATIENT_CLINIC_OR_DEPARTMENT_OTHER): Payer: Medicare Other | Attending: General Surgery

## 2013-07-04 DIAGNOSIS — I872 Venous insufficiency (chronic) (peripheral): Secondary | ICD-10-CM | POA: Diagnosis not present

## 2013-07-04 DIAGNOSIS — L97809 Non-pressure chronic ulcer of other part of unspecified lower leg with unspecified severity: Secondary | ICD-10-CM | POA: Insufficient documentation

## 2013-07-05 ENCOUNTER — Ambulatory Visit: Payer: Medicare Other | Admitting: Vascular Surgery

## 2013-07-05 ENCOUNTER — Ambulatory Visit (INDEPENDENT_AMBULATORY_CARE_PROVIDER_SITE_OTHER): Payer: Medicare Other | Admitting: Internal Medicine

## 2013-07-05 ENCOUNTER — Encounter: Payer: Self-pay | Admitting: Internal Medicine

## 2013-07-05 ENCOUNTER — Other Ambulatory Visit (INDEPENDENT_AMBULATORY_CARE_PROVIDER_SITE_OTHER): Payer: Medicare Other

## 2013-07-05 VITALS — BP 140/80 | HR 68 | Temp 97.8°F | Resp 16 | Wt 215.0 lb

## 2013-07-05 DIAGNOSIS — Z8679 Personal history of other diseases of the circulatory system: Secondary | ICD-10-CM

## 2013-07-05 DIAGNOSIS — I4891 Unspecified atrial fibrillation: Secondary | ICD-10-CM

## 2013-07-05 DIAGNOSIS — M109 Gout, unspecified: Secondary | ICD-10-CM

## 2013-07-05 DIAGNOSIS — I6350 Cerebral infarction due to unspecified occlusion or stenosis of unspecified cerebral artery: Secondary | ICD-10-CM | POA: Diagnosis not present

## 2013-07-05 DIAGNOSIS — Z5189 Encounter for other specified aftercare: Secondary | ICD-10-CM | POA: Diagnosis not present

## 2013-07-05 DIAGNOSIS — N058 Unspecified nephritic syndrome with other morphologic changes: Secondary | ICD-10-CM

## 2013-07-05 DIAGNOSIS — E538 Deficiency of other specified B group vitamins: Secondary | ICD-10-CM

## 2013-07-05 DIAGNOSIS — I509 Heart failure, unspecified: Secondary | ICD-10-CM

## 2013-07-05 DIAGNOSIS — D638 Anemia in other chronic diseases classified elsewhere: Secondary | ICD-10-CM

## 2013-07-05 DIAGNOSIS — E1129 Type 2 diabetes mellitus with other diabetic kidney complication: Secondary | ICD-10-CM

## 2013-07-05 DIAGNOSIS — N183 Chronic kidney disease, stage 3 unspecified: Secondary | ICD-10-CM

## 2013-07-05 DIAGNOSIS — E876 Hypokalemia: Secondary | ICD-10-CM

## 2013-07-05 DIAGNOSIS — I5043 Acute on chronic combined systolic (congestive) and diastolic (congestive) heart failure: Secondary | ICD-10-CM

## 2013-07-05 DIAGNOSIS — D509 Iron deficiency anemia, unspecified: Secondary | ICD-10-CM

## 2013-07-05 DIAGNOSIS — S81809D Unspecified open wound, unspecified lower leg, subsequent encounter: Secondary | ICD-10-CM

## 2013-07-05 DIAGNOSIS — I2510 Atherosclerotic heart disease of native coronary artery without angina pectoris: Secondary | ICD-10-CM | POA: Diagnosis not present

## 2013-07-05 LAB — CBC WITH DIFFERENTIAL/PLATELET
Basophils Relative: 0.2 % (ref 0.0–3.0)
Eosinophils Relative: 0.8 % (ref 0.0–5.0)
HCT: 33.6 % — ABNORMAL LOW (ref 39.0–52.0)
Hemoglobin: 10.9 g/dL — ABNORMAL LOW (ref 13.0–17.0)
Lymphs Abs: 0.7 10*3/uL (ref 0.7–4.0)
MCV: 80.8 fl (ref 78.0–100.0)
Monocytes Absolute: 0.4 10*3/uL (ref 0.1–1.0)
Neutro Abs: 7.9 10*3/uL — ABNORMAL HIGH (ref 1.4–7.7)
Platelets: 194 10*3/uL (ref 150.0–400.0)
RBC: 4.16 Mil/uL — ABNORMAL LOW (ref 4.22–5.81)
WBC: 9.1 10*3/uL (ref 4.5–10.5)

## 2013-07-05 LAB — BASIC METABOLIC PANEL
BUN: 20 mg/dL (ref 6–23)
Chloride: 104 mEq/L (ref 96–112)
Potassium: 3.5 mEq/L (ref 3.5–5.1)

## 2013-07-05 LAB — HEMOGLOBIN A1C: Hgb A1c MFr Bld: 6.3 % (ref 4.6–6.5)

## 2013-07-05 MED ORDER — FERROUS SULFATE 325 (65 FE) MG PO TABS: 325.0000 mg | ORAL_TABLET | Freq: Every day | ORAL | Status: DC

## 2013-07-05 NOTE — Assessment & Plan Note (Signed)
Continue with current prescription therapy as reflected on the Med list.  

## 2013-07-05 NOTE — Assessment & Plan Note (Signed)
Start iron po 

## 2013-07-05 NOTE — Assessment & Plan Note (Signed)
Wound clinic

## 2013-07-05 NOTE — Assessment & Plan Note (Addendum)
Labs

## 2013-07-05 NOTE — Assessment & Plan Note (Signed)
Labs

## 2013-07-05 NOTE — Assessment & Plan Note (Addendum)
See meds Contour EZ given

## 2013-07-05 NOTE — Assessment & Plan Note (Signed)
BMET 

## 2013-07-05 NOTE — Assessment & Plan Note (Signed)
Recurrent x5 Last one -- 05/2013

## 2013-07-05 NOTE — Progress Notes (Signed)
   Subjective:    HPI  He is here for a f/u: Discharge Diagnoses 06/09/13 --  Near syncope  Active Problems:  DM (diabetes mellitus), type 2 with renal complications  HYPERLIPIDEMIA  HYPERTENSION  Acute on chronic combined systolic and diastolic congestive heart failure  CVA  CKD (chronic kidney disease) stage 3, GFR 30-59 ml/min  Wound, open, leg  Atherosclerotic peripheral vascular disease with intermittent claudication  Orthostatic hypotension  Hypokalemia  Elevated troponin  Atrial fibrillation with RVR    Review of Systems  Constitutional: Positive for fatigue. Negative for appetite change and unexpected weight change.  HENT: Negative for nosebleeds, congestion, sneezing, trouble swallowing and neck pain.   Eyes: Negative for itching and visual disturbance.  Cardiovascular: Positive for leg swelling. Negative for palpitations.  Gastrointestinal: Negative for nausea, diarrhea, blood in stool and abdominal distention.  Genitourinary: Negative for frequency and hematuria.  Musculoskeletal: Positive for arthralgias. Negative for back pain, joint swelling and gait problem.  Neurological: Negative for dizziness, tremors, speech difficulty and weakness.  Psychiatric/Behavioral: Negative for sleep disturbance, dysphoric mood and agitation. The patient is nervous/anxious.        Objective:   Physical Exam  Constitutional: He is oriented to person, place, and time. He appears well-developed.  HENT:  Mouth/Throat: Oropharynx is clear and moist.  Eyes: Conjunctivae are normal. Pupils are equal, round, and reactive to light.  Neck: Normal range of motion. No JVD present. No thyromegaly present.  Cardiovascular: Normal rate, regular rhythm, normal heart sounds and intact distal pulses.  Exam reveals no gallop and no friction rub.   No murmur heard. Pulmonary/Chest: Effort normal and breath sounds normal. No respiratory distress. He has no wheezes. He has no rales. He exhibits no  tenderness.  Abdominal: Soft. Bowel sounds are normal. He exhibits no distension and no mass. There is no tenderness. There is no rebound and no guarding.  Musculoskeletal: Normal range of motion. He exhibits edema (trace B). He exhibits no tenderness.  Lymphadenopathy:    He has no cervical adenopathy.  Neurological: He is alert and oriented to person, place, and time. He has normal reflexes. No cranial nerve deficit. He exhibits normal muscle tone. Coordination normal.  Skin: Skin is warm and dry. No rash noted.    Hyperpigmented ankles  Psychiatric: He has a normal mood and affect. His behavior is normal. Judgment and thought content normal.  B LEs are wraped  Lab Results  Component Value Date   WBC 10.2 06/10/2013   HGB 10.9* 06/10/2013   HCT 34.3* 06/10/2013   PLT 231 06/10/2013   GLUCOSE 98 06/13/2013   CHOL 133 04/17/2013   TRIG 103.0 04/17/2013   HDL 42.20 04/17/2013   LDLDIRECT 177.9 06/17/2010   LDLCALC 70 04/17/2013   ALT 12 04/15/2011   AST 13 04/15/2011   NA 146* 06/13/2013   K 3.2* 06/13/2013   CL 103 06/13/2013   CREATININE 2.19* 06/13/2013   BUN 20 06/13/2013   CO2 32 06/13/2013   TSH 2.557 06/09/2013   PSA 0.01* 11/18/2011   INR 2.73* 06/13/2013   HGBA1C 6.1* 06/09/2013     hosp record     Assessment & Plan:

## 2013-07-05 NOTE — Assessment & Plan Note (Signed)
Recurrent (x5), last 05/2013 On coumadin

## 2013-07-06 DIAGNOSIS — E119 Type 2 diabetes mellitus without complications: Secondary | ICD-10-CM | POA: Diagnosis not present

## 2013-07-06 DIAGNOSIS — I10 Essential (primary) hypertension: Secondary | ICD-10-CM | POA: Diagnosis not present

## 2013-07-06 DIAGNOSIS — I872 Venous insufficiency (chronic) (peripheral): Secondary | ICD-10-CM | POA: Diagnosis not present

## 2013-07-06 DIAGNOSIS — S81009A Unspecified open wound, unspecified knee, initial encounter: Secondary | ICD-10-CM | POA: Diagnosis not present

## 2013-07-06 DIAGNOSIS — Z9181 History of falling: Secondary | ICD-10-CM | POA: Diagnosis not present

## 2013-07-06 DIAGNOSIS — I509 Heart failure, unspecified: Secondary | ICD-10-CM | POA: Diagnosis not present

## 2013-07-09 DIAGNOSIS — E119 Type 2 diabetes mellitus without complications: Secondary | ICD-10-CM | POA: Diagnosis not present

## 2013-07-09 DIAGNOSIS — S91009A Unspecified open wound, unspecified ankle, initial encounter: Secondary | ICD-10-CM | POA: Diagnosis not present

## 2013-07-09 DIAGNOSIS — I10 Essential (primary) hypertension: Secondary | ICD-10-CM | POA: Diagnosis not present

## 2013-07-09 DIAGNOSIS — Z9181 History of falling: Secondary | ICD-10-CM | POA: Diagnosis not present

## 2013-07-09 DIAGNOSIS — I509 Heart failure, unspecified: Secondary | ICD-10-CM | POA: Diagnosis not present

## 2013-07-09 DIAGNOSIS — I872 Venous insufficiency (chronic) (peripheral): Secondary | ICD-10-CM | POA: Diagnosis not present

## 2013-07-10 DIAGNOSIS — I10 Essential (primary) hypertension: Secondary | ICD-10-CM | POA: Diagnosis not present

## 2013-07-10 DIAGNOSIS — I509 Heart failure, unspecified: Secondary | ICD-10-CM | POA: Diagnosis not present

## 2013-07-10 DIAGNOSIS — E119 Type 2 diabetes mellitus without complications: Secondary | ICD-10-CM | POA: Diagnosis not present

## 2013-07-10 DIAGNOSIS — L97209 Non-pressure chronic ulcer of unspecified calf with unspecified severity: Secondary | ICD-10-CM | POA: Diagnosis not present

## 2013-07-10 DIAGNOSIS — I872 Venous insufficiency (chronic) (peripheral): Secondary | ICD-10-CM | POA: Diagnosis not present

## 2013-07-11 DIAGNOSIS — L97809 Non-pressure chronic ulcer of other part of unspecified lower leg with unspecified severity: Secondary | ICD-10-CM | POA: Diagnosis not present

## 2013-07-11 DIAGNOSIS — I872 Venous insufficiency (chronic) (peripheral): Secondary | ICD-10-CM | POA: Diagnosis not present

## 2013-07-11 LAB — GLUCOSE, CAPILLARY: Glucose-Capillary: 167 mg/dL — ABNORMAL HIGH (ref 70–99)

## 2013-07-17 ENCOUNTER — Other Ambulatory Visit: Payer: Self-pay | Admitting: Internal Medicine

## 2013-07-18 DIAGNOSIS — L97809 Non-pressure chronic ulcer of other part of unspecified lower leg with unspecified severity: Secondary | ICD-10-CM | POA: Diagnosis not present

## 2013-07-18 DIAGNOSIS — I872 Venous insufficiency (chronic) (peripheral): Secondary | ICD-10-CM | POA: Diagnosis not present

## 2013-07-18 LAB — GLUCOSE, CAPILLARY: Glucose-Capillary: 122 mg/dL — ABNORMAL HIGH (ref 70–99)

## 2013-07-19 ENCOUNTER — Telehealth: Payer: Self-pay | Admitting: Nurse Practitioner

## 2013-07-19 ENCOUNTER — Telehealth: Payer: Self-pay | Admitting: Family Medicine

## 2013-07-19 ENCOUNTER — Telehealth: Payer: Self-pay | Admitting: Cardiology

## 2013-07-19 ENCOUNTER — Ambulatory Visit (INDEPENDENT_AMBULATORY_CARE_PROVIDER_SITE_OTHER): Payer: Medicare Other | Admitting: *Deleted

## 2013-07-19 DIAGNOSIS — Z7901 Long term (current) use of anticoagulants: Secondary | ICD-10-CM

## 2013-07-19 DIAGNOSIS — I6350 Cerebral infarction due to unspecified occlusion or stenosis of unspecified cerebral artery: Secondary | ICD-10-CM | POA: Diagnosis not present

## 2013-07-19 LAB — PROTIME-INR
INR: 5.56 (ref <1.50)
Prothrombin Time: 48.2 seconds — ABNORMAL HIGH (ref 11.6–15.2)

## 2013-07-19 LAB — POCT INR: INR: 8

## 2013-07-19 NOTE — Telephone Encounter (Signed)
Warfarin adjusted by Coumadin clinic. Patient had no follow up appointment after being in the hospital, has seen PCP. Did offer appointment with NP/PA, wife refuses to let her husband be seen by anyone other than a doctor. Scheduled follow up with Dr Antoine Poche for September since he will be out of the office. Wife will call back if patient has any problems

## 2013-07-19 NOTE — Telephone Encounter (Signed)
Received call from [facility] with critical INR.  Bethena Midget, RN in CV[facility] notified.

## 2013-07-19 NOTE — Telephone Encounter (Signed)
Frank Mclean with [employer] brought in medical equipment authorization forms to be signed by Dr Gwenlyn Saran  They are to be faxed back Forms are in her mailbox

## 2013-07-19 NOTE — Telephone Encounter (Signed)
New prob  Pt wife said they were advised by coumadin to schedule appt with Dr Antoine Poche asap due to a high coumadin level. Dr Antoine Poche nor any of the PAs have anything before Aug 20.  Wife would like to speak with a nurse.

## 2013-07-20 DIAGNOSIS — I872 Venous insufficiency (chronic) (peripheral): Secondary | ICD-10-CM | POA: Diagnosis not present

## 2013-07-20 DIAGNOSIS — I10 Essential (primary) hypertension: Secondary | ICD-10-CM | POA: Diagnosis not present

## 2013-07-20 DIAGNOSIS — I509 Heart failure, unspecified: Secondary | ICD-10-CM | POA: Diagnosis not present

## 2013-07-20 DIAGNOSIS — L97209 Non-pressure chronic ulcer of unspecified calf with unspecified severity: Secondary | ICD-10-CM | POA: Diagnosis not present

## 2013-07-20 DIAGNOSIS — E119 Type 2 diabetes mellitus without complications: Secondary | ICD-10-CM | POA: Diagnosis not present

## 2013-07-23 DIAGNOSIS — I10 Essential (primary) hypertension: Secondary | ICD-10-CM | POA: Diagnosis not present

## 2013-07-23 DIAGNOSIS — I509 Heart failure, unspecified: Secondary | ICD-10-CM | POA: Diagnosis not present

## 2013-07-23 DIAGNOSIS — E119 Type 2 diabetes mellitus without complications: Secondary | ICD-10-CM | POA: Diagnosis not present

## 2013-07-23 DIAGNOSIS — L97209 Non-pressure chronic ulcer of unspecified calf with unspecified severity: Secondary | ICD-10-CM | POA: Diagnosis not present

## 2013-07-23 DIAGNOSIS — I872 Venous insufficiency (chronic) (peripheral): Secondary | ICD-10-CM | POA: Diagnosis not present

## 2013-07-25 ENCOUNTER — Other Ambulatory Visit: Payer: Self-pay

## 2013-07-25 ENCOUNTER — Encounter (HOSPITAL_BASED_OUTPATIENT_CLINIC_OR_DEPARTMENT_OTHER): Payer: Medicare Other | Attending: General Surgery

## 2013-07-25 DIAGNOSIS — L89899 Pressure ulcer of other site, unspecified stage: Secondary | ICD-10-CM | POA: Diagnosis not present

## 2013-07-25 DIAGNOSIS — L8993 Pressure ulcer of unspecified site, stage 3: Secondary | ICD-10-CM | POA: Insufficient documentation

## 2013-07-26 ENCOUNTER — Ambulatory Visit (INDEPENDENT_AMBULATORY_CARE_PROVIDER_SITE_OTHER): Payer: Medicare Other | Admitting: *Deleted

## 2013-07-26 DIAGNOSIS — I6350 Cerebral infarction due to unspecified occlusion or stenosis of unspecified cerebral artery: Secondary | ICD-10-CM

## 2013-07-26 DIAGNOSIS — Z7901 Long term (current) use of anticoagulants: Secondary | ICD-10-CM | POA: Diagnosis not present

## 2013-07-26 LAB — POCT INR: INR: 4.5

## 2013-08-02 ENCOUNTER — Ambulatory Visit (INDEPENDENT_AMBULATORY_CARE_PROVIDER_SITE_OTHER): Payer: Medicare Other | Admitting: Pharmacist

## 2013-08-02 DIAGNOSIS — Z7901 Long term (current) use of anticoagulants: Secondary | ICD-10-CM

## 2013-08-02 DIAGNOSIS — I6350 Cerebral infarction due to unspecified occlusion or stenosis of unspecified cerebral artery: Secondary | ICD-10-CM | POA: Diagnosis not present

## 2013-08-06 ENCOUNTER — Ambulatory Visit (INDEPENDENT_AMBULATORY_CARE_PROVIDER_SITE_OTHER): Payer: Medicare Other | Admitting: Internal Medicine

## 2013-08-06 ENCOUNTER — Encounter: Payer: Self-pay | Admitting: Internal Medicine

## 2013-08-06 VITALS — BP 122/72 | HR 65 | Temp 98.1°F | Resp 10 | Wt 214.0 lb

## 2013-08-06 DIAGNOSIS — E538 Deficiency of other specified B group vitamins: Secondary | ICD-10-CM | POA: Diagnosis not present

## 2013-08-06 DIAGNOSIS — R269 Unspecified abnormalities of gait and mobility: Secondary | ICD-10-CM | POA: Diagnosis not present

## 2013-08-06 DIAGNOSIS — I10 Essential (primary) hypertension: Secondary | ICD-10-CM

## 2013-08-06 MED ORDER — HYDROCODONE-ACETAMINOPHEN 5-325 MG PO TABS: 1.0000 | ORAL_TABLET | Freq: Four times a day (QID) | ORAL | Status: DC | PRN

## 2013-08-06 MED ORDER — BUDESONIDE-FORMOTEROL FUMARATE 160-4.5 MCG/ACT IN AERO: 2.0000 | INHALATION_SPRAY | Freq: Two times a day (BID) | RESPIRATORY_TRACT | Status: DC

## 2013-08-06 NOTE — Assessment & Plan Note (Signed)
2014 he did better after rehab - now is worse due to weakness and pain Hold lipitor x 1-2 mo

## 2013-08-06 NOTE — Progress Notes (Signed)
   Subjective:    HPI  C/o B leg pain  Near syncope  Active Problems:  DM (diabetes mellitus), type 2 with renal complications  HYPERLIPIDEMIA  HYPERTENSION  Acute on chronic combined systolic and diastolic congestive heart failure  CVA  CKD (chronic kidney disease) stage 3, GFR 30-59 ml/min  Wound, open, leg  Atherosclerotic peripheral vascular disease with intermittent claudication  Orthostatic hypotension  Hypokalemia  Elevated troponin  Atrial fibrillation with RVR    Review of Systems  Constitutional: Positive for fatigue. Negative for appetite change and unexpected weight change.  HENT: Negative for nosebleeds, congestion, sneezing, trouble swallowing and neck pain.   Eyes: Negative for itching and visual disturbance.  Cardiovascular: Positive for leg swelling. Negative for palpitations.  Gastrointestinal: Negative for nausea, diarrhea, blood in stool and abdominal distention.  Genitourinary: Negative for frequency and hematuria.  Musculoskeletal: Positive for arthralgias. Negative for back pain, joint swelling and gait problem.  Neurological: Negative for dizziness, tremors, speech difficulty and weakness.  Psychiatric/Behavioral: Negative for sleep disturbance, dysphoric mood and agitation. The patient is nervous/anxious.        Objective:   Physical Exam  Constitutional: He is oriented to person, place, and time. He appears well-developed.  HENT:  Mouth/Throat: Oropharynx is clear and moist.  Eyes: Conjunctivae are normal. Pupils are equal, round, and reactive to light.  Neck: Normal range of motion. No JVD present. No thyromegaly present.  Cardiovascular: Normal rate, regular rhythm, normal heart sounds and intact distal pulses.  Exam reveals no gallop and no friction rub.   No murmur heard. Pulmonary/Chest: Effort normal and breath sounds normal. No respiratory distress. He has no wheezes. He has no rales. He exhibits no tenderness.  Abdominal: Soft. Bowel  sounds are normal. He exhibits no distension and no mass. There is no tenderness. There is no rebound and no guarding.  Musculoskeletal: Normal range of motion. He exhibits edema (trace B). He exhibits no tenderness.  Lymphadenopathy:    He has no cervical adenopathy.  Neurological: He is alert and oriented to person, place, and time. He has normal reflexes. No cranial nerve deficit. He exhibits normal muscle tone. Coordination normal.  Skin: Skin is warm and dry. No rash noted.    Hyperpigmented ankles  Psychiatric: He has a normal mood and affect. His behavior is normal. Judgment and thought content normal.  B LEs are wraped  Lab Results  Component Value Date   WBC 9.1 07/05/2013   HGB 10.9* 07/05/2013   HCT 33.6* 07/05/2013   PLT 194.0 07/05/2013   GLUCOSE 126* 07/05/2013   CHOL 133 04/17/2013   TRIG 103.0 04/17/2013   HDL 42.20 04/17/2013   LDLDIRECT 177.9 06/17/2010   LDLCALC 70 04/17/2013   ALT 12 04/15/2011   AST 13 04/15/2011   NA 146* 07/05/2013   K 3.5 07/05/2013   CL 104 07/05/2013   CREATININE 2.3* 07/05/2013   BUN 20 07/05/2013   CO2 32 07/05/2013   TSH 2.557 06/09/2013   PSA 0.01* 11/18/2011   INR 2.0 08/02/2013   HGBA1C 6.3 07/05/2013          Assessment & Plan:

## 2013-08-06 NOTE — Assessment & Plan Note (Signed)
Continue with current prescription therapy as reflected on the Med list.  

## 2013-08-06 NOTE — Patient Instructions (Addendum)
Hold Lipitor x 4-8 weeks

## 2013-08-07 ENCOUNTER — Other Ambulatory Visit: Payer: Self-pay | Admitting: Family Medicine

## 2013-08-08 DIAGNOSIS — L89899 Pressure ulcer of other site, unspecified stage: Secondary | ICD-10-CM | POA: Diagnosis not present

## 2013-08-08 DIAGNOSIS — L8993 Pressure ulcer of unspecified site, stage 3: Secondary | ICD-10-CM | POA: Diagnosis not present

## 2013-08-08 DIAGNOSIS — I872 Venous insufficiency (chronic) (peripheral): Secondary | ICD-10-CM | POA: Diagnosis not present

## 2013-08-10 DIAGNOSIS — I872 Venous insufficiency (chronic) (peripheral): Secondary | ICD-10-CM | POA: Diagnosis not present

## 2013-08-10 DIAGNOSIS — E119 Type 2 diabetes mellitus without complications: Secondary | ICD-10-CM | POA: Diagnosis not present

## 2013-08-10 DIAGNOSIS — I509 Heart failure, unspecified: Secondary | ICD-10-CM | POA: Diagnosis not present

## 2013-08-10 DIAGNOSIS — I10 Essential (primary) hypertension: Secondary | ICD-10-CM | POA: Diagnosis not present

## 2013-08-10 DIAGNOSIS — L97209 Non-pressure chronic ulcer of unspecified calf with unspecified severity: Secondary | ICD-10-CM | POA: Diagnosis not present

## 2013-08-13 DIAGNOSIS — I10 Essential (primary) hypertension: Secondary | ICD-10-CM | POA: Diagnosis not present

## 2013-08-13 DIAGNOSIS — I872 Venous insufficiency (chronic) (peripheral): Secondary | ICD-10-CM | POA: Diagnosis not present

## 2013-08-13 DIAGNOSIS — E119 Type 2 diabetes mellitus without complications: Secondary | ICD-10-CM | POA: Diagnosis not present

## 2013-08-13 DIAGNOSIS — I509 Heart failure, unspecified: Secondary | ICD-10-CM | POA: Diagnosis not present

## 2013-08-13 DIAGNOSIS — L97209 Non-pressure chronic ulcer of unspecified calf with unspecified severity: Secondary | ICD-10-CM | POA: Diagnosis not present

## 2013-08-16 ENCOUNTER — Ambulatory Visit (INDEPENDENT_AMBULATORY_CARE_PROVIDER_SITE_OTHER): Payer: Medicare Other

## 2013-08-16 DIAGNOSIS — I6350 Cerebral infarction due to unspecified occlusion or stenosis of unspecified cerebral artery: Secondary | ICD-10-CM

## 2013-08-16 DIAGNOSIS — Z7901 Long term (current) use of anticoagulants: Secondary | ICD-10-CM

## 2013-08-17 DIAGNOSIS — E119 Type 2 diabetes mellitus without complications: Secondary | ICD-10-CM | POA: Diagnosis not present

## 2013-08-17 DIAGNOSIS — I872 Venous insufficiency (chronic) (peripheral): Secondary | ICD-10-CM | POA: Diagnosis not present

## 2013-08-17 DIAGNOSIS — I509 Heart failure, unspecified: Secondary | ICD-10-CM | POA: Diagnosis not present

## 2013-08-17 DIAGNOSIS — I10 Essential (primary) hypertension: Secondary | ICD-10-CM | POA: Diagnosis not present

## 2013-08-17 DIAGNOSIS — L97209 Non-pressure chronic ulcer of unspecified calf with unspecified severity: Secondary | ICD-10-CM | POA: Diagnosis not present

## 2013-08-20 DIAGNOSIS — I509 Heart failure, unspecified: Secondary | ICD-10-CM | POA: Diagnosis not present

## 2013-08-20 DIAGNOSIS — E119 Type 2 diabetes mellitus without complications: Secondary | ICD-10-CM | POA: Diagnosis not present

## 2013-08-20 DIAGNOSIS — L97209 Non-pressure chronic ulcer of unspecified calf with unspecified severity: Secondary | ICD-10-CM | POA: Diagnosis not present

## 2013-08-20 DIAGNOSIS — I10 Essential (primary) hypertension: Secondary | ICD-10-CM | POA: Diagnosis not present

## 2013-08-20 DIAGNOSIS — I872 Venous insufficiency (chronic) (peripheral): Secondary | ICD-10-CM | POA: Diagnosis not present

## 2013-08-22 ENCOUNTER — Encounter (HOSPITAL_BASED_OUTPATIENT_CLINIC_OR_DEPARTMENT_OTHER): Payer: Medicare Other | Attending: General Surgery

## 2013-08-22 DIAGNOSIS — L8993 Pressure ulcer of unspecified site, stage 3: Secondary | ICD-10-CM | POA: Insufficient documentation

## 2013-08-22 DIAGNOSIS — L89509 Pressure ulcer of unspecified ankle, unspecified stage: Secondary | ICD-10-CM | POA: Insufficient documentation

## 2013-08-24 ENCOUNTER — Other Ambulatory Visit: Payer: Self-pay | Admitting: Internal Medicine

## 2013-08-24 ENCOUNTER — Encounter: Payer: Self-pay | Admitting: Cardiology

## 2013-08-24 ENCOUNTER — Ambulatory Visit (INDEPENDENT_AMBULATORY_CARE_PROVIDER_SITE_OTHER): Payer: Medicare Other | Admitting: Cardiology

## 2013-08-24 ENCOUNTER — Ambulatory Visit (INDEPENDENT_AMBULATORY_CARE_PROVIDER_SITE_OTHER): Payer: Medicare Other | Admitting: *Deleted

## 2013-08-24 VITALS — BP 129/82 | HR 63 | Ht 74.5 in | Wt <= 1120 oz

## 2013-08-24 DIAGNOSIS — I2510 Atherosclerotic heart disease of native coronary artery without angina pectoris: Secondary | ICD-10-CM | POA: Diagnosis not present

## 2013-08-24 DIAGNOSIS — I4891 Unspecified atrial fibrillation: Secondary | ICD-10-CM | POA: Diagnosis not present

## 2013-08-24 DIAGNOSIS — Z79899 Other long term (current) drug therapy: Secondary | ICD-10-CM | POA: Diagnosis not present

## 2013-08-24 DIAGNOSIS — I509 Heart failure, unspecified: Secondary | ICD-10-CM | POA: Diagnosis not present

## 2013-08-24 DIAGNOSIS — L97209 Non-pressure chronic ulcer of unspecified calf with unspecified severity: Secondary | ICD-10-CM | POA: Diagnosis not present

## 2013-08-24 DIAGNOSIS — I951 Orthostatic hypotension: Secondary | ICD-10-CM

## 2013-08-24 DIAGNOSIS — I6350 Cerebral infarction due to unspecified occlusion or stenosis of unspecified cerebral artery: Secondary | ICD-10-CM | POA: Diagnosis not present

## 2013-08-24 DIAGNOSIS — I872 Venous insufficiency (chronic) (peripheral): Secondary | ICD-10-CM | POA: Diagnosis not present

## 2013-08-24 DIAGNOSIS — Z7901 Long term (current) use of anticoagulants: Secondary | ICD-10-CM

## 2013-08-24 DIAGNOSIS — I5043 Acute on chronic combined systolic (congestive) and diastolic (congestive) heart failure: Secondary | ICD-10-CM | POA: Diagnosis not present

## 2013-08-24 DIAGNOSIS — I10 Essential (primary) hypertension: Secondary | ICD-10-CM | POA: Diagnosis not present

## 2013-08-24 DIAGNOSIS — E119 Type 2 diabetes mellitus without complications: Secondary | ICD-10-CM | POA: Diagnosis not present

## 2013-08-24 LAB — COMPREHENSIVE METABOLIC PANEL
Albumin: 3.6 g/dL (ref 3.5–5.2)
BUN: 24 mg/dL — ABNORMAL HIGH (ref 6–23)
CO2: 32 mEq/L (ref 19–32)
Calcium: 9.3 mg/dL (ref 8.4–10.5)
Chloride: 103 mEq/L (ref 96–112)
Glucose, Bld: 92 mg/dL (ref 70–99)
Potassium: 3 mEq/L — ABNORMAL LOW (ref 3.5–5.1)

## 2013-08-24 LAB — TSH: TSH: 0.9 u[IU]/mL (ref 0.35–5.50)

## 2013-08-24 MED ORDER — AMIODARONE HCL 200 MG PO TABS: 200.0000 mg | ORAL_TABLET | Freq: Two times a day (BID) | ORAL | Status: DC

## 2013-08-24 NOTE — Progress Notes (Signed)
HPI The patient presents for evaluation after his hospitalization in June. Among other problems he did have heart failure, acute on chronic renal insufficiency and atrial fibrillation. He has venous ulcers. These are being addressed and treated. He did go to rehabilitation but while there he says he lost about 20 pounds because he couldn't move. He is now back at home. He walks with a walker and does do exercises and an outside gym. He's not describing any acute cardiovascular symptoms. He has not had any palpitations, presyncope or syncope. He has had no acute shortness of breath, PND or orthopnea. He has mild to moderate chronic lower extremity swelling. He's had no problems his anticoagulation. He denies any bruising excessively or obvious bleeding.   No Known Allergies  Current Outpatient Prescriptions  Medication Sig Dispense Refill  . amiodarone (PACERONE) 400 MG tablet Take 1 tablet (400 mg total) by mouth 2 (two) times daily.  60 tablet  0  . amLODipine (NORVASC) 10 MG tablet Take 1 tablet (10 mg total) by mouth daily.  30 tablet  11  . atorvastatin (LIPITOR) 20 MG tablet Take 1 tablet (20 mg total) by mouth daily. For cholesterol  30 tablet  3  . budesonide-formoterol (SYMBICORT) 160-4.5 MCG/ACT inhaler Inhale 2 puffs into the lungs 2 (two) times daily.  1 Inhaler  5  . Cholecalciferol 1000 UNITS tablet Take 1 tablet (1,000 Units total) by mouth daily.  30 tablet  2  . collagenase (SANTYL) ointment Apply 1 application topically 2 (two) times a week.      . cyanocobalamin 1000 MCG tablet Take 1 tablet (1,000 mcg total) by mouth daily.  100 tablet  3  . doxycycline (VIBRAMYCIN) 100 MG capsule       . ferrous sulfate 325 (65 FE) MG tablet Take 1 tablet (325 mg total) by mouth daily.  30 tablet  6  . Fluticasone Furoate-Vilanterol (BREO ELLIPTA) 100-25 MCG/INH AEPB Inhale 1 puff into the lungs daily as needed (for breathing problems).      [name] glipiZIDE (GLUCOTROL) 5 MG tablet Take 0.5  tablets (2.5 mg total) by mouth 2 (two) times daily before a meal.  30 tablet  3  . hydrALAZINE (APRESOLINE) 50 MG tablet Take 1 tablet (50 mg total) by mouth 3 (three) times daily.  90 tablet  0  . HYDROcodone-acetaminophen (NORCO/VICODIN) 5-325 MG per tablet Take 1 tablet by mouth every 6 (six) hours as needed for pain.  120 tablet  1  . insulin aspart (NOVOLOG) 100 UNIT/ML injection Inject 3 Units into the skin 3 (three) times daily with meals.  1 vial  12  . isosorbide mononitrate (IMDUR) 60 MG 24 hr tablet Take 1 tablet (60 mg total) by mouth daily.  30 tablet  0  . labetalol (NORMODYNE) 200 MG tablet Take 1 tablet (200 mg total) by mouth 2 (two) times daily.  60 tablet  2  . nitroGLYCERIN (NITROSTAT) 0.4 MG SL tablet Place 1 tablet (0.4 mg total) under the tongue every 5 (five) minutes as needed for chest pain.  4 tablet  0  . potassium chloride SA (K-DUR,KLOR-CON) 20 MEQ tablet Take 2 tablets (40 mEq total) by mouth 2 (two) times daily.  120 tablet  1  . torsemide (DEMADEX) 100 MG tablet Take 0.5 tablets (50 mg total) by mouth daily.  30 tablet  3  . triamcinolone cream (KENALOG) 0.5 % Apply 1 application topically 2 (two) times daily as needed (for skin breakdown).  30 g  0  . warfarin (COUMADIN) 5 MG tablet Take 1 tablet (5 mg total) by mouth daily. Takes 5 mg daily starting Sunday 7/13, then recheck next week  30 tablet  0   No current facility-administered medications for this visit.    Past Medical History  Diagnosis Date  . Gout   . HTN (hypertension)   . Renal insufficiency   . Hyperlipidemia   . Osteoarthritis   . History of CVA (cerebrovascular accident)     Left pontine infarct July 2004; changed from Plavix to Coumadin in 2004 per MD at [facility]  . GERD (gastroesophageal reflux disease)   . DJD (degenerative joint disease)   . BPH (benign prostatic hypertrophy)   . Cardiomyopathy, hypertensive     NON-ISCHEMIC  . CAD (coronary artery disease) CARDIOLOGIST-  DR Kaniel Kiang      Non obstructive  . DM (diabetes mellitus), type 2   . Shortness of breath   . Peripheral vascular disease   . LBBB (left bundle branch block)   . Bilateral leg ulcer     ACHILLES AREA--  NONHEALING  . Left ventricular diastolic dysfunction, NYHA class 3   . Cardiac LV ejection fraction 21-40%     25 -30%  PER ECHO 1/14  . Systolic and diastolic CHF, acute on chronic     EF 25% PER ECHO 1/14    Past Surgical History  Procedure Laterality Date  . Cardiac catheterization  04-16-2011   DR [facility]    NON-OBSTRUCTIVE CAD. MILDLY ELEVATED PULMONARY PRESSURES/ ELEVATED  END-DIASTOLIC PRESSURE  . Transthoracic echocardiogram  12-31-2010    MODERATE CONCENTRIC LVH/ SYSTOLIC FUNCTION SEVERELY REDUCED/ EF 25-30%/  SEVERE HYPOKINESIS OF ANTEROSEPTAL MYOCARDIUM  AND ENTIREAPICAL MYOCARDIUM /  MODERATE HYPOKINESIS OF LATERAL, INFEROLATERAL, INFERIOR,AND INFEROSEPTAL MYOCARIUM/  GRADE 3 DIASTOLIC DYSFUNCTION/ MILD MR  . Aortogram w/ bilateral lower extremitiy runoff  05-18-2013  DR FIELDS    LEFT LEG OCCLUDED PERONEAL AND ANTERIOR TIBIAL ARTERIES/ HIGH GRADE STENOIS 80% MIDDLE AND DISTAL THIRD OF POSTERIOR TIBIAL ARTERY/ RIGHT PERONEAL AND ANTERIOR TIBIAL ARTERY OCCLUDED/ 40% STENOSIS DISTALLY     ROS:  As stated in the HPI and negative for all other systems.  PHYSICAL EXAM BP 129/82  Ht 6' 2.5" (1.892 m)  Wt 21 lb 1.3 oz (9.562 kg)  BMI 2.67 kg/m2 GENERAL:  Well appearing NECK:  6 cm jugular venous distention, waveform within normal limits, carotid upstroke brisk and symmetric, no bruits, no thyromegaly LYMPHATICS:  No cervical, inguinal adenopathy LUNGS:  Clear to auscultation bilaterally BACK:  No CVA tenderness CHEST:  Unremarkable HEART:  PMI not displaced or sustained,S1 and S2 within normal limits, no S3, no S4, no clicks, no rubs, no murmurs ABD:  Flat, positive bowel sounds normal in frequency in pitch, no bruits, no rebound, no guarding, no midline pulsatile mass, no hepatomegaly,  no splenomegaly EXT:  2 plus pulses throughout, left greater than right mild to moderate edema, no cyanosis no clubbing SKIN:  No rashes no nodules, healing lower extremity left leg wound medial, right leg wound lateral NEURO:  Cranial nerves II through XII grossly intact, motor grossly intact throughout PSYCH:  Cognitively intact, oriented to person place and time  EKG:  Sinus rhythm, left bundle branch block, left axis deviation, premature ectopic complexes, rate 63. 08/24/2013  ASSESSMENT AND PLAN  CONGESTIVE HEART FAILURE -  He seems to be euvolemic. His creatinine was up in July so I will need to check this. For now he will remain on the meds  as listed.  HYPERTENSION -  The blood pressure is at target. No change in medications is indicated. We will continue with therapeutic lifestyle changes (TLC).  PVD - He and is being followed by the wound clinic. No change in therapy is indicated.  ATRIAL FIBRILLATION:   The patient seems to be maintaining sinus rhythm. I will reduce the amiodarone to 200 mg daily. I will check a TSH as well as comprehensive metabolic profile with liver enzymes.

## 2013-08-24 NOTE — Patient Instructions (Addendum)
Please decrease your Amiodarone to 200 mg a day Continue all other medications as listed.  Please have blood work today  (CMP and TSH).  Follow up in 3 months with Dr Antoine Poche.

## 2013-08-27 ENCOUNTER — Telehealth: Payer: Self-pay | Admitting: Cardiology

## 2013-08-27 DIAGNOSIS — E119 Type 2 diabetes mellitus without complications: Secondary | ICD-10-CM | POA: Diagnosis not present

## 2013-08-27 DIAGNOSIS — I10 Essential (primary) hypertension: Secondary | ICD-10-CM | POA: Diagnosis not present

## 2013-08-27 DIAGNOSIS — I872 Venous insufficiency (chronic) (peripheral): Secondary | ICD-10-CM | POA: Diagnosis not present

## 2013-08-27 DIAGNOSIS — L97209 Non-pressure chronic ulcer of unspecified calf with unspecified severity: Secondary | ICD-10-CM | POA: Diagnosis not present

## 2013-08-27 DIAGNOSIS — E876 Hypokalemia: Secondary | ICD-10-CM

## 2013-08-27 DIAGNOSIS — I509 Heart failure, unspecified: Secondary | ICD-10-CM | POA: Diagnosis not present

## 2013-08-27 NOTE — Telephone Encounter (Signed)
Spoke with patient's wife. See lab notes.

## 2013-08-27 NOTE — Telephone Encounter (Signed)
New Problem  Pt had a test done for kidneys/// Has not recieved any results.// Please call back to discuss.

## 2013-08-29 DIAGNOSIS — L8993 Pressure ulcer of unspecified site, stage 3: Secondary | ICD-10-CM | POA: Diagnosis not present

## 2013-08-29 DIAGNOSIS — L89509 Pressure ulcer of unspecified ankle, unspecified stage: Secondary | ICD-10-CM | POA: Diagnosis not present

## 2013-08-31 ENCOUNTER — Telehealth: Payer: Self-pay | Admitting: Pharmacist

## 2013-08-31 ENCOUNTER — Ambulatory Visit (INDEPENDENT_AMBULATORY_CARE_PROVIDER_SITE_OTHER): Payer: Medicare Other | Admitting: Cardiology

## 2013-08-31 DIAGNOSIS — E119 Type 2 diabetes mellitus without complications: Secondary | ICD-10-CM | POA: Diagnosis not present

## 2013-08-31 DIAGNOSIS — I6350 Cerebral infarction due to unspecified occlusion or stenosis of unspecified cerebral artery: Secondary | ICD-10-CM

## 2013-08-31 DIAGNOSIS — Z7901 Long term (current) use of anticoagulants: Secondary | ICD-10-CM

## 2013-08-31 DIAGNOSIS — I509 Heart failure, unspecified: Secondary | ICD-10-CM | POA: Diagnosis not present

## 2013-08-31 DIAGNOSIS — I872 Venous insufficiency (chronic) (peripheral): Secondary | ICD-10-CM | POA: Diagnosis not present

## 2013-08-31 DIAGNOSIS — L97209 Non-pressure chronic ulcer of unspecified calf with unspecified severity: Secondary | ICD-10-CM | POA: Diagnosis not present

## 2013-08-31 DIAGNOSIS — I10 Essential (primary) hypertension: Secondary | ICD-10-CM | POA: Diagnosis not present

## 2013-08-31 LAB — POCT INR: INR: 4.6

## 2013-08-31 NOTE — Telephone Encounter (Signed)
Pt is due to have INR today 08/31/13 to be drawn by [facility].  Called spoke with pt, pt states the nurse did come out to his house today and check his INR.  INR 4.5 per pt report.  Advised pt not to take Coumadin today and I will call back once results received with further dosing instructions. Pt verbalized understanding.

## 2013-08-31 NOTE — Telephone Encounter (Signed)
[facility] returned call with results at 4:45pm.  Attempted to call pt back to dose further, pt's phone forwarded to voicemail, stating they are on the other line.  Cell phone goes straight to VM.  WCB on Monday to dose further, see anticoag note in Epic.

## 2013-08-31 NOTE — Telephone Encounter (Signed)
New Problem  Made coumadin appt for 9/18.. There were no orders/// please let me know if this needs to be canceled/// [facility]

## 2013-09-03 DIAGNOSIS — I509 Heart failure, unspecified: Secondary | ICD-10-CM | POA: Diagnosis not present

## 2013-09-03 DIAGNOSIS — L97209 Non-pressure chronic ulcer of unspecified calf with unspecified severity: Secondary | ICD-10-CM | POA: Diagnosis not present

## 2013-09-03 DIAGNOSIS — I872 Venous insufficiency (chronic) (peripheral): Secondary | ICD-10-CM | POA: Diagnosis not present

## 2013-09-03 DIAGNOSIS — I10 Essential (primary) hypertension: Secondary | ICD-10-CM | POA: Diagnosis not present

## 2013-09-03 DIAGNOSIS — E119 Type 2 diabetes mellitus without complications: Secondary | ICD-10-CM | POA: Diagnosis not present

## 2013-09-04 ENCOUNTER — Telehealth: Payer: Self-pay | Admitting: Cardiology

## 2013-09-04 NOTE — Telephone Encounter (Signed)
Phone is busy, will continue to call.

## 2013-09-04 NOTE — Telephone Encounter (Signed)
Follow up  Second call  Pt's wife states his feet are swollen// breathing issues// SOB not extreme had to use an enhaler

## 2013-09-04 NOTE — Telephone Encounter (Signed)
New Problem  Pt's wife states his feet are swollen// breathing issues// SOB not extreme had to use an enhaler.[name]

## 2013-09-04 NOTE — Telephone Encounter (Signed)
Pt given appt to see Tereso Newcomer, PA-C on 9/18 at 3:20. Also, pts wife is advised to call 911 if pts edema/SOB worsens, she verbalized understanding and agrees with plan.

## 2013-09-05 DIAGNOSIS — L89509 Pressure ulcer of unspecified ankle, unspecified stage: Secondary | ICD-10-CM | POA: Diagnosis not present

## 2013-09-05 DIAGNOSIS — L8993 Pressure ulcer of unspecified site, stage 3: Secondary | ICD-10-CM | POA: Diagnosis not present

## 2013-09-06 ENCOUNTER — Ambulatory Visit (INDEPENDENT_AMBULATORY_CARE_PROVIDER_SITE_OTHER)
Admission: RE | Admit: 2013-09-06 | Discharge: 2013-09-06 | Disposition: A | Discharge status: Home / Self Care | Payer: Medicare Other | Source: Ambulatory Visit | Attending: Physician Assistant | Admitting: Physician Assistant

## 2013-09-06 ENCOUNTER — Other Ambulatory Visit: Payer: Medicare Other

## 2013-09-06 ENCOUNTER — Ambulatory Visit (INDEPENDENT_AMBULATORY_CARE_PROVIDER_SITE_OTHER): Payer: Medicare Other | Admitting: *Deleted

## 2013-09-06 ENCOUNTER — Telehealth: Payer: Self-pay | Admitting: *Deleted

## 2013-09-06 ENCOUNTER — Encounter: Payer: Self-pay | Admitting: Physician Assistant

## 2013-09-06 ENCOUNTER — Ambulatory Visit (INDEPENDENT_AMBULATORY_CARE_PROVIDER_SITE_OTHER): Payer: Medicare Other | Admitting: Physician Assistant

## 2013-09-06 VITALS — BP 116/62 | HR 64 | Ht 74.5 in | Wt 208.0 lb

## 2013-09-06 DIAGNOSIS — I10 Essential (primary) hypertension: Secondary | ICD-10-CM

## 2013-09-06 DIAGNOSIS — I6350 Cerebral infarction due to unspecified occlusion or stenosis of unspecified cerebral artery: Secondary | ICD-10-CM

## 2013-09-06 DIAGNOSIS — Z7901 Long term (current) use of anticoagulants: Secondary | ICD-10-CM

## 2013-09-06 DIAGNOSIS — R0602 Shortness of breath: Secondary | ICD-10-CM

## 2013-09-06 DIAGNOSIS — E785 Hyperlipidemia, unspecified: Secondary | ICD-10-CM

## 2013-09-06 DIAGNOSIS — I5043 Acute on chronic combined systolic (congestive) and diastolic (congestive) heart failure: Secondary | ICD-10-CM

## 2013-09-06 DIAGNOSIS — I4891 Unspecified atrial fibrillation: Secondary | ICD-10-CM

## 2013-09-06 DIAGNOSIS — I2510 Atherosclerotic heart disease of native coronary artery without angina pectoris: Secondary | ICD-10-CM

## 2013-09-06 DIAGNOSIS — J9819 Other pulmonary collapse: Secondary | ICD-10-CM | POA: Diagnosis not present

## 2013-09-06 MED ORDER — METOLAZONE 5 MG PO TABS: ORAL_TABLET | ORAL | Status: DC

## 2013-09-06 NOTE — Progress Notes (Signed)
[address], [address] [place], [place]  [address] Phone: [phone] Fax:  [phone]  Date:  09/06/2013   ID:  Frank Mclean, Frank Mclean 1941/03/15, MRN [id number]  PCP:  [name], MD  Cardiologist:  Dr. Rollene Rotunda     History of Present Illness: Frank Mclean is a 72 y.o. male who returns for evaluation of increased edema and dyspnea.  He has a history of chronic combined systolic and diastolic CHF, nonischemic cardiomyopathy, nonobstructive CAD, atrial fibrillation, CAD, HTN, HL, PAD. LHC 4/12: Mid LAD 25%, mid diagonal 30%, AV circumflex 40%, proximal OM 25%, distal RCA 40%. Echocardiogram 05/2013: EF 25-30%, diffuse HK, restrictive physiology, trivial AI, mild MR, moderate LAE, reduced RV systolic function, PASP 42.  Last admission 05/2013 for atrial fibrillation with RVR complicated by type II non-STEMI and acute on chronic combined systolic and diastolic CHF. He was placed on amiodarone at that time with restoration of normal sinus rhythm. Last seen by Dr. Antoine Poche 08/24/13.  He was maintaining sinus rhythm. Amiodarone dose was reduced.  He is going to the wound center due to bilateral lower extremity ulcers. He is wearing UNNA boots.  He noticed increasing LE edema approximately 4-5 days ago when his legs were unwrapped. His wife gave him an extra dose of furosemide 20 mg x1. He had minimal improvement. He has noted increased dyspnea with exertion. He is NYHA class III. He has been sleeping on 3 pillows (+1) recently. He denies PND. He denies chest pain or syncope. He has noted a cough. This is mainly nonproductive. He will occasionally notice a pink color or brownish color to his sputum. He denies fever. He has not been consistently weighing himself. Weights in our office are unreliable.  Labs (7/14):   Hgb 10.9 Labs (9/14):   K 3, creatinine 2.5, ALT 14, TSH 0.90  Wt Readings from Last 3 Encounters:  09/06/13 208 lb (94.348 kg)  08/24/13 21 lb 1.3 oz (9.562 kg)  08/06/13 214  lb (97.07 kg)     Past Medical History  Diagnosis Date  . Gout   . HTN (hypertension)   . Hyperlipidemia   . Osteoarthritis   . History of CVA (cerebrovascular accident)     Left pontine infarct July 2004; changed from Plavix to Coumadin in 2004 per MD at [facility]  . GERD (gastroesophageal reflux disease)   . DJD (degenerative joint disease)   . BPH (benign prostatic hypertrophy)   . Non-ischemic cardiomyopathy   . DM (diabetes mellitus), type 2   . Peripheral vascular disease   . Bilateral leg ulcer     ACHILLES AREA--  NONHEALING  . CKD (chronic kidney disease) stage 4, GFR 15-29 ml/min   . CAD (coronary artery disease) CARDIOLOGIST-  DR [facility]    LHC 4/12: Mid LAD 25%, mid diagonal 30%, AV circumflex 40%, proximal OM 25%, distal RCA 40%  . Chronic combined systolic and diastolic heart failure     Echocardiogram 05/2013: EF 25-30%, diffuse HK, restrictive physiology, trivial AI, mild MR, moderate LAE, reduced RV systolic function, PASP 42  . LBBB (left bundle branch block)   . Atrial fibrillation     amiodarone Rx started 05/2013    Current Outpatient Prescriptions  Medication Sig Dispense Refill  . amiodarone (PACERONE) 200 MG tablet Take 1 tablet (200 mg total) by mouth 2 (two) times daily.  60 tablet  0  . amLODipine (NORVASC) 10 MG tablet Take 1 tablet (10 mg total) by mouth daily.  30 tablet  11  . atorvastatin (LIPITOR) 20 MG tablet Take 1 tablet (20 mg total) by mouth daily. For cholesterol  30 tablet  3  . budesonide-formoterol (SYMBICORT) 160-4.5 MCG/ACT inhaler Inhale 2 puffs into the lungs 2 (two) times daily.  1 Inhaler  5  . Cholecalciferol 1000 UNITS tablet Take 1 tablet (1,000 Units total) by mouth daily.  30 tablet  2  . collagenase (SANTYL) ointment Apply 1 application topically 2 (two) times a week.      . cyanocobalamin 1000 MCG tablet Take 1 tablet (1,000 mcg total) by mouth daily.  100 tablet  3  . doxycycline (VIBRAMYCIN) 100 MG capsule as directed.         . ferrous sulfate 325 (65 FE) MG tablet Take 1 tablet (325 mg total) by mouth daily.  30 tablet  6  . Fluticasone Furoate-Vilanterol (BREO ELLIPTA) 100-25 MCG/INH AEPB Inhale 1 puff into the lungs daily as needed (for breathing problems).      [name] glipiZIDE (GLUCOTROL) 5 MG tablet Take 0.5 tablets (2.5 mg total) by mouth 2 (two) times daily before a meal.  30 tablet  3  . hydrALAZINE (APRESOLINE) 50 MG tablet Take 1 tablet (50 mg total) by mouth 3 (three) times daily.  90 tablet  0  . HYDROcodone-acetaminophen (NORCO/VICODIN) 5-325 MG per tablet Take 1 tablet by mouth every 6 (six) hours as needed for pain.  120 tablet  1  . isosorbide mononitrate (IMDUR) 60 MG 24 hr tablet Take 1 tablet (60 mg total) by mouth daily.  30 tablet  0  . labetalol (NORMODYNE) 200 MG tablet Take 1 tablet (200 mg total) by mouth 2 (two) times daily.  60 tablet  2  . nitroGLYCERIN (NITROSTAT) 0.4 MG SL tablet Place 1 tablet (0.4 mg total) under the tongue every 5 (five) minutes as needed for chest pain.  4 tablet  0  . potassium chloride SA (K-DUR,KLOR-CON) 20 MEQ tablet Take 20 mEq by mouth 2 (two) times daily.      [name] torsemide (DEMADEX) 100 MG tablet 50 mg 2 (two) times daily.       [name] triamcinolone cream (KENALOG) 0.5 % Apply 1 application topically 2 (two) times daily as needed (for skin breakdown).  30 g  0  . warfarin (COUMADIN) 5 MG tablet Take 1 tablet (5 mg total) by mouth daily. Takes 5 mg daily starting Sunday 7/13, then recheck next week  30 tablet  0   No current facility-administered medications for this visit.    Allergies:   No Known Allergies  Social History:  The patient  reports that he quit smoking about 39 years ago. He has never used smokeless tobacco. He reports that he does not drink alcohol or use illicit drugs.   ROS:  Please see the history of present illness.   He has noted some diarrhea. His stools are somewhat dark from iron therapy.   All other systems reviewed and negative.   PHYSICAL  EXAM: VS:  BP 116/62  Pulse 64  Ht 6' 2.5" (1.892 m)  Wt 208 lb (94.348 kg)  BMI 26.36 kg/m2  SpO2 97% Well nourished, well developed, in no acute distress HEENT: normal Neck: no JVD at 90 Cardiac:  normal S1, S2; RRR; no murmur Lungs:  Decreased breath sounds at the bases bilaterally (right greater than left), no wheezing, no rales; I cannot appreciate egophony Abd: soft, nontender, no hepatomegaly Ext: 1+ bilateral LE edema Skin: warm and dry Neuro:  CNs 2-12 intact, no focal abnormalities noted  EKG:  NSR, HR 64, LBBB     ASSESSMENT AND PLAN:  1. Acute on Chronic Combined Systolic and Diastolic CHF:  Weights are unreliable. His wife tells me that his edema is worse as well as his breathing. His lung exam is somewhat abnormal as he has diminished breath sounds especially at the right base. He is on a very large dose of torsemide. I reviewed his case with Dr. Antoine Poche who knows him well. I considered increasing his torsemide but in the end we decided to add metolazone 5 mg 30 minutes prior to his a.m. Torsemide for 2 days. He will take an extra potassium x 1 for 2 days. I will check a basic metabolic panel and a BNP today. Repeat a basic metabolic panel in 4-5 days. I will obtain a chest x-ray. He will be brought back in close follow up. His wife knows to contact us if his symptoms are worsening or not improving. 2. CAD: No angina. He is not on aspirin as he is on Coumadin. Continue statin. 3. Atrial Fibrillation: Maintaining sinus rhythm. He remains on Coumadin. Recent amiodarone surveillance labs were optimal. 4. Chronic Kidney Disease: Recent creatinine elevated. I will keep a close eye on his renal function with adjustments in his diuretics. 5. Hypertension: Controlled. 6. Hyperlipidemia: Continue statin. 7. Lower Extremity Ulcers: Continue follow up with the Wound Center. 8. Disposition: Follow up with me or Dr. Antoine Poche in 1 week.   Signed, Tereso Newcomer, PA-C  09/06/2013 3:52  PM

## 2013-09-06 NOTE — Patient Instructions (Addendum)
TAKE METOLAZONE 5 MG 30 MINUTES BEFORE YOU TAKE YOUR MORNING TORSEMIDE FOR 2 DAYS MAKE SURE TO TAKE AN EXTRA POTASSIUM FOR THE 2 DAYS WHEN YOU TAKE THE METOLAZONE  LAB TODAY; BMET, BNP  REPEAT BMET IN 4-5 DAYS  CHEST X-RAY TODAY AT [facility] ON [address]  PLEASE FOLLOW UP WITH DR. HOCHREIN OR SCOTT WEAVER, PAC SAME DAY DR. Antoine Poche IS IN THE OFFICE

## 2013-09-06 NOTE — Telephone Encounter (Signed)
lmom cxr no acute findings, continue with current treatment

## 2013-09-07 ENCOUNTER — Other Ambulatory Visit: Payer: Medicare Other

## 2013-09-07 ENCOUNTER — Telehealth: Payer: Self-pay | Admitting: Cardiology

## 2013-09-07 ENCOUNTER — Telehealth: Payer: Self-pay | Admitting: *Deleted

## 2013-09-07 DIAGNOSIS — I509 Heart failure, unspecified: Secondary | ICD-10-CM | POA: Diagnosis not present

## 2013-09-07 DIAGNOSIS — L97209 Non-pressure chronic ulcer of unspecified calf with unspecified severity: Secondary | ICD-10-CM | POA: Diagnosis not present

## 2013-09-07 DIAGNOSIS — E119 Type 2 diabetes mellitus without complications: Secondary | ICD-10-CM | POA: Diagnosis not present

## 2013-09-07 DIAGNOSIS — R0602 Shortness of breath: Secondary | ICD-10-CM

## 2013-09-07 DIAGNOSIS — I872 Venous insufficiency (chronic) (peripheral): Secondary | ICD-10-CM | POA: Diagnosis not present

## 2013-09-07 DIAGNOSIS — I10 Essential (primary) hypertension: Secondary | ICD-10-CM | POA: Diagnosis not present

## 2013-09-07 LAB — BRAIN NATRIURETIC PEPTIDE: Pro B Natriuretic peptide (BNP): 131 pg/mL — ABNORMAL HIGH (ref 0.0–100.0)

## 2013-09-07 LAB — BASIC METABOLIC PANEL
BUN: 22 mg/dL (ref 6–23)
Creatinine, Ser: 2.6 mg/dL — ABNORMAL HIGH (ref 0.4–1.5)
GFR: 31.58 mL/min — ABNORMAL LOW (ref >60.00)
Glucose, Bld: 85 mg/dL (ref 70–99)

## 2013-09-07 MED ORDER — POTASSIUM CHLORIDE CRYS ER 20 MEQ PO TBCR: 60.0000 meq | EXTENDED_RELEASE_TABLET | Freq: Every day | ORAL | Status: DC

## 2013-09-07 NOTE — Telephone Encounter (Signed)
wife has been notified about lab results and med changes with verbal understanding ; appt and lab made for 09/13/13 10:50 per SW, PA needing pt to be seein in 1 week same day [facility] in the office

## 2013-09-07 NOTE — Telephone Encounter (Signed)
wife has been notified about lab results and med changes with verbal understanding ; appt and lab made for 09/13/13 10:50 per SW, PA needing pt to be seein in 1 week same day [facility] in the office 

## 2013-09-07 NOTE — Telephone Encounter (Signed)
pt asked if I would cb later and s/w wife about lab results. I told pt that would be fine.

## 2013-09-07 NOTE — Telephone Encounter (Signed)
Pt's wife is calling in to get lab results. She states someone called her husband, the patient, earlier today. She was curious as to what they were calling about. She would like to speak with Pam.

## 2013-09-07 NOTE — Telephone Encounter (Signed)
lmtcb for test results 

## 2013-09-08 DIAGNOSIS — I509 Heart failure, unspecified: Secondary | ICD-10-CM | POA: Diagnosis not present

## 2013-09-08 DIAGNOSIS — E119 Type 2 diabetes mellitus without complications: Secondary | ICD-10-CM | POA: Diagnosis not present

## 2013-09-08 DIAGNOSIS — I872 Venous insufficiency (chronic) (peripheral): Secondary | ICD-10-CM | POA: Diagnosis not present

## 2013-09-08 DIAGNOSIS — I10 Essential (primary) hypertension: Secondary | ICD-10-CM | POA: Diagnosis not present

## 2013-09-08 DIAGNOSIS — L97209 Non-pressure chronic ulcer of unspecified calf with unspecified severity: Secondary | ICD-10-CM | POA: Diagnosis not present

## 2013-09-10 DIAGNOSIS — I509 Heart failure, unspecified: Secondary | ICD-10-CM | POA: Diagnosis not present

## 2013-09-10 DIAGNOSIS — E119 Type 2 diabetes mellitus without complications: Secondary | ICD-10-CM | POA: Diagnosis not present

## 2013-09-10 DIAGNOSIS — I872 Venous insufficiency (chronic) (peripheral): Secondary | ICD-10-CM | POA: Diagnosis not present

## 2013-09-10 DIAGNOSIS — L97209 Non-pressure chronic ulcer of unspecified calf with unspecified severity: Secondary | ICD-10-CM | POA: Diagnosis not present

## 2013-09-10 DIAGNOSIS — I10 Essential (primary) hypertension: Secondary | ICD-10-CM | POA: Diagnosis not present

## 2013-09-12 DIAGNOSIS — L89509 Pressure ulcer of unspecified ankle, unspecified stage: Secondary | ICD-10-CM | POA: Diagnosis not present

## 2013-09-12 DIAGNOSIS — L8993 Pressure ulcer of unspecified site, stage 3: Secondary | ICD-10-CM | POA: Diagnosis not present

## 2013-09-13 ENCOUNTER — Encounter: Payer: Self-pay | Admitting: Physician Assistant

## 2013-09-13 ENCOUNTER — Inpatient Hospital Stay (HOSPITAL_COMMUNITY)
Admission: EM | Admit: 2013-09-13 | Discharge: 2013-09-18 | DRG: 682 | Disposition: A | Discharge status: Skilled Nursing Facility | Payer: Medicare Other | Attending: Cardiovascular Disease | Admitting: Cardiovascular Disease

## 2013-09-13 ENCOUNTER — Observation Stay (HOSPITAL_COMMUNITY): Payer: Medicare Other

## 2013-09-13 ENCOUNTER — Encounter (HOSPITAL_COMMUNITY): Payer: Self-pay | Admitting: *Deleted

## 2013-09-13 ENCOUNTER — Ambulatory Visit (INDEPENDENT_AMBULATORY_CARE_PROVIDER_SITE_OTHER): Payer: Medicare Other | Admitting: Physician Assistant

## 2013-09-13 ENCOUNTER — Telehealth: Payer: Self-pay | Admitting: *Deleted

## 2013-09-13 VITALS — BP 137/76 | HR 59 | Ht 74.5 in | Wt 206.0 lb

## 2013-09-13 DIAGNOSIS — E860 Dehydration: Secondary | ICD-10-CM | POA: Diagnosis present

## 2013-09-13 DIAGNOSIS — I428 Other cardiomyopathies: Secondary | ICD-10-CM | POA: Diagnosis present

## 2013-09-13 DIAGNOSIS — R51 Headache: Secondary | ICD-10-CM

## 2013-09-13 DIAGNOSIS — I5022 Chronic systolic (congestive) heart failure: Secondary | ICD-10-CM

## 2013-09-13 DIAGNOSIS — E876 Hypokalemia: Secondary | ICD-10-CM | POA: Diagnosis not present

## 2013-09-13 DIAGNOSIS — L97909 Non-pressure chronic ulcer of unspecified part of unspecified lower leg with unspecified severity: Secondary | ICD-10-CM | POA: Diagnosis present

## 2013-09-13 DIAGNOSIS — E119 Type 2 diabetes mellitus without complications: Secondary | ICD-10-CM | POA: Diagnosis present

## 2013-09-13 DIAGNOSIS — K219 Gastro-esophageal reflux disease without esophagitis: Secondary | ICD-10-CM | POA: Diagnosis present

## 2013-09-13 DIAGNOSIS — M1990 Unspecified osteoarthritis, unspecified site: Secondary | ICD-10-CM | POA: Diagnosis present

## 2013-09-13 DIAGNOSIS — R29898 Other symptoms and signs involving the musculoskeletal system: Secondary | ICD-10-CM

## 2013-09-13 DIAGNOSIS — M6281 Muscle weakness (generalized): Secondary | ICD-10-CM | POA: Diagnosis not present

## 2013-09-13 DIAGNOSIS — R531 Weakness: Secondary | ICD-10-CM

## 2013-09-13 DIAGNOSIS — N183 Chronic kidney disease, stage 3 unspecified: Secondary | ICD-10-CM | POA: Diagnosis present

## 2013-09-13 DIAGNOSIS — E785 Hyperlipidemia, unspecified: Secondary | ICD-10-CM | POA: Diagnosis present

## 2013-09-13 DIAGNOSIS — I739 Peripheral vascular disease, unspecified: Secondary | ICD-10-CM | POA: Diagnosis present

## 2013-09-13 DIAGNOSIS — R609 Edema, unspecified: Secondary | ICD-10-CM | POA: Diagnosis not present

## 2013-09-13 DIAGNOSIS — I679 Cerebrovascular disease, unspecified: Secondary | ICD-10-CM

## 2013-09-13 DIAGNOSIS — I129 Hypertensive chronic kidney disease with stage 1 through stage 4 chronic kidney disease, or unspecified chronic kidney disease: Secondary | ICD-10-CM | POA: Diagnosis not present

## 2013-09-13 DIAGNOSIS — M109 Gout, unspecified: Secondary | ICD-10-CM | POA: Diagnosis present

## 2013-09-13 DIAGNOSIS — R29818 Other symptoms and signs involving the nervous system: Secondary | ICD-10-CM | POA: Diagnosis not present

## 2013-09-13 DIAGNOSIS — Z8673 Personal history of transient ischemic attack (TIA), and cerebral infarction without residual deficits: Secondary | ICD-10-CM | POA: Diagnosis not present

## 2013-09-13 DIAGNOSIS — J449 Chronic obstructive pulmonary disease, unspecified: Secondary | ICD-10-CM | POA: Diagnosis present

## 2013-09-13 DIAGNOSIS — Z8679 Personal history of other diseases of the circulatory system: Secondary | ICD-10-CM

## 2013-09-13 DIAGNOSIS — I2510 Atherosclerotic heart disease of native coronary artery without angina pectoris: Secondary | ICD-10-CM | POA: Diagnosis not present

## 2013-09-13 DIAGNOSIS — E232 Diabetes insipidus: Secondary | ICD-10-CM | POA: Diagnosis not present

## 2013-09-13 DIAGNOSIS — R5381 Other malaise: Secondary | ICD-10-CM | POA: Diagnosis not present

## 2013-09-13 DIAGNOSIS — Z7901 Long term (current) use of anticoagulants: Secondary | ICD-10-CM | POA: Diagnosis not present

## 2013-09-13 DIAGNOSIS — N179 Acute kidney failure, unspecified: Secondary | ICD-10-CM

## 2013-09-13 DIAGNOSIS — I4891 Unspecified atrial fibrillation: Secondary | ICD-10-CM | POA: Diagnosis present

## 2013-09-13 DIAGNOSIS — E1129 Type 2 diabetes mellitus with other diabetic kidney complication: Secondary | ICD-10-CM | POA: Diagnosis not present

## 2013-09-13 DIAGNOSIS — I447 Left bundle-branch block, unspecified: Secondary | ICD-10-CM | POA: Diagnosis present

## 2013-09-13 DIAGNOSIS — I10 Essential (primary) hypertension: Secondary | ICD-10-CM

## 2013-09-13 DIAGNOSIS — I5042 Chronic combined systolic (congestive) and diastolic (congestive) heart failure: Secondary | ICD-10-CM

## 2013-09-13 DIAGNOSIS — I5043 Acute on chronic combined systolic (congestive) and diastolic (congestive) heart failure: Secondary | ICD-10-CM | POA: Diagnosis not present

## 2013-09-13 DIAGNOSIS — Z87891 Personal history of nicotine dependence: Secondary | ICD-10-CM

## 2013-09-13 DIAGNOSIS — I5041 Acute combined systolic (congestive) and diastolic (congestive) heart failure: Secondary | ICD-10-CM | POA: Diagnosis not present

## 2013-09-13 DIAGNOSIS — N189 Chronic kidney disease, unspecified: Secondary | ICD-10-CM

## 2013-09-13 DIAGNOSIS — I872 Venous insufficiency (chronic) (peripheral): Secondary | ICD-10-CM | POA: Diagnosis present

## 2013-09-13 DIAGNOSIS — I509 Heart failure, unspecified: Secondary | ICD-10-CM | POA: Diagnosis not present

## 2013-09-13 HISTORY — DX: Other cardiomyopathies: I42.8

## 2013-09-13 LAB — BASIC METABOLIC PANEL
BUN: 45 mg/dL — ABNORMAL HIGH (ref 6–23)
CO2: 36 mEq/L — ABNORMAL HIGH (ref 19–32)
Chloride: 95 mEq/L — ABNORMAL LOW (ref 96–112)
Creatinine, Ser: 3.3 mg/dL — ABNORMAL HIGH (ref 0.4–1.5)
GFR: 23.83 mL/min — ABNORMAL LOW (ref >60.00)
Glucose, Bld: 103 mg/dL — ABNORMAL HIGH (ref 70–99)
Potassium: 2.5 mEq/L — CL (ref 3.5–5.1)
Sodium: 143 mEq/L (ref 135–145)

## 2013-09-13 LAB — CBC WITH DIFFERENTIAL/PLATELET
Basophils Absolute: 0 10*3/uL (ref 0.0–0.1)
Basophils Absolute: 0 10*3/uL (ref 0.0–0.1)
Basophils Relative: 0.1 % (ref 0.0–3.0)
Basophils Relative: 0 % (ref 0–1)
Eosinophils Absolute: 0.1 10*3/uL (ref 0.0–0.7)
Eosinophils Relative: 0.3 % (ref 0.0–5.0)
Eosinophils Relative: 1 % (ref 0–5)
HCT: 33.2 % — ABNORMAL LOW (ref 39.0–52.0)
HCT: 30.8 % — ABNORMAL LOW (ref 39.0–52.0)
MCH: 24.5 pg — ABNORMAL LOW (ref 26.0–34.0)
MCHC: 31.8 g/dL (ref 30.0–36.0)
MCHC: 31.5 g/dL (ref 30.0–36.0)
MCV: 77.7 fl — ABNORMAL LOW (ref 78.0–100.0)
MCV: 77.8 fL — ABNORMAL LOW (ref 78.0–100.0)
Monocytes Absolute: 0.7 10*3/uL (ref 0.1–1.0)
Monocytes Absolute: 0.7 10*3/uL (ref 0.1–1.0)
Monocytes Relative: 7 % (ref 3–12)
Neutro Abs: 8.3 10*3/uL — ABNORMAL HIGH (ref 1.7–7.7)
Neutrophils Relative %: 84.8 % — ABNORMAL HIGH (ref 43.0–77.0)
Platelets: 262 10*3/uL (ref 150–400)
RBC: 4.27 Mil/uL (ref 4.22–5.81)
RBC: 3.96 MIL/uL — ABNORMAL LOW (ref 4.22–5.81)
RDW: 17.1 % — ABNORMAL HIGH (ref 11.5–14.6)
RDW: 16 % — ABNORMAL HIGH (ref 11.5–15.5)
WBC: 11.4 10*3/uL — ABNORMAL HIGH (ref 4.5–10.5)
WBC: 10.1 10*3/uL (ref 4.0–10.5)

## 2013-09-13 LAB — URINALYSIS
Hgb urine dipstick: NEGATIVE
Ketones, ur: NEGATIVE
Specific Gravity, Urine: 1.01 (ref 1.000–1.030)
Total Protein, Urine: NEGATIVE
Urine Glucose: NEGATIVE
Urobilinogen, UA: 0.2 (ref 0.0–1.0)

## 2013-09-13 LAB — POCT I-STAT TROPONIN I: Troponin i, poc: 0.12 ng/mL (ref 0.00–0.08)

## 2013-09-13 LAB — COMPREHENSIVE METABOLIC PANEL
ALT: 15 U/L (ref 0–53)
AST: 15 U/L (ref 0–37)
Albumin: 3.5 g/dL (ref 3.5–5.2)
Alkaline Phosphatase: 90 U/L (ref 39–117)
CO2: 31 mEq/L (ref 19–32)
Calcium: 8.9 mg/dL (ref 8.4–10.5)
Chloride: 97 mEq/L (ref 96–112)
Creatinine, Ser: 3.54 mg/dL — ABNORMAL HIGH (ref 0.50–1.35)
GFR calc Af Amer: 18 mL/min — ABNORMAL LOW (ref >90)
GFR calc non Af Amer: 16 mL/min — ABNORMAL LOW (ref >90)
Potassium: 2.8 mEq/L — ABNORMAL LOW (ref 3.5–5.1)
Sodium: 143 mEq/L (ref 135–145)
Total Protein: 6.7 g/dL (ref 6.0–8.3)

## 2013-09-13 LAB — MAGNESIUM: Magnesium: 1.8 mg/dL (ref 1.5–2.5)

## 2013-09-13 MED ORDER — FERROUS SULFATE 325 (65 FE) MG PO TABS
325.0000 mg | ORAL_TABLET | Freq: Every day | ORAL | Status: DC
  Administered 2013-09-14 – 2013-09-18 (×5): 325 mg via ORAL
  Filled 2013-09-13 (×5): qty 1

## 2013-09-13 MED ORDER — INSULIN ASPART 100 UNIT/ML ~~LOC~~ SOLN
0.0000 [IU] | Freq: Three times a day (TID) | SUBCUTANEOUS | Status: DC
  Administered 2013-09-15 – 2013-09-17 (×2): 2 [IU] via SUBCUTANEOUS

## 2013-09-13 MED ORDER — COLLAGENASE 250 UNIT/GM EX OINT
1.0000 "application " | TOPICAL_OINTMENT | CUTANEOUS | Status: DC
  Administered 2013-09-14: 1 via TOPICAL
  Filled 2013-09-13 (×2): qty 30

## 2013-09-13 MED ORDER — HYDROCODONE-ACETAMINOPHEN 5-325 MG PO TABS
1.0000 | ORAL_TABLET | Freq: Two times a day (BID) | ORAL | Status: DC | PRN
  Administered 2013-09-14 – 2013-09-17 (×4): 1 via ORAL
  Filled 2013-09-13 (×5): qty 1

## 2013-09-13 MED ORDER — SODIUM CHLORIDE 0.9 % IV SOLN
INTRAVENOUS | Status: DC
  Administered 2013-09-13: 1000 mL via INTRAVENOUS
  Administered 2013-09-16 – 2013-09-18 (×2): via INTRAVENOUS

## 2013-09-13 MED ORDER — POTASSIUM CHLORIDE CRYS ER 20 MEQ PO TBCR
40.0000 meq | EXTENDED_RELEASE_TABLET | Freq: Once | ORAL | Status: AC
  Administered 2013-09-13: 40 meq via ORAL
  Filled 2013-09-13: qty 2

## 2013-09-13 MED ORDER — WARFARIN SODIUM 2.5 MG PO TABS
2.5000 mg | ORAL_TABLET | ORAL | Status: DC
  Administered 2013-09-14 – 2013-09-17 (×2): 2.5 mg via ORAL
  Filled 2013-09-13 (×2): qty 1

## 2013-09-13 MED ORDER — LABETALOL HCL 200 MG PO TABS
200.0000 mg | ORAL_TABLET | Freq: Two times a day (BID) | ORAL | Status: DC
  Administered 2013-09-13 – 2013-09-18 (×10): 200 mg via ORAL
  Filled 2013-09-13 (×12): qty 1

## 2013-09-13 MED ORDER — ONDANSETRON HCL 4 MG/2ML IJ SOLN: 4.0000 mg | Freq: Four times a day (QID) | INTRAMUSCULAR | Status: DC | PRN

## 2013-09-13 MED ORDER — ASPIRIN EC 81 MG PO TBEC
81.0000 mg | DELAYED_RELEASE_TABLET | Freq: Every day | ORAL | Status: DC
  Administered 2013-09-14 – 2013-09-18 (×5): 81 mg via ORAL
  Filled 2013-09-13 (×5): qty 1

## 2013-09-13 MED ORDER — ACETAMINOPHEN 325 MG PO TABS
650.0000 mg | ORAL_TABLET | ORAL | Status: DC | PRN
  Administered 2013-09-14: 650 mg via ORAL
  Filled 2013-09-13: qty 2

## 2013-09-13 MED ORDER — SODIUM CHLORIDE 0.9 % IV SOLN: 1.0000 g | Freq: Once | INTRAVENOUS | Status: DC

## 2013-09-13 MED ORDER — BUDESONIDE-FORMOTEROL FUMARATE 160-4.5 MCG/ACT IN AERO: 2.0000 | INHALATION_SPRAY | Freq: Every day | RESPIRATORY_TRACT | Status: DC | PRN

## 2013-09-13 MED ORDER — GLIPIZIDE 2.5 MG HALF TABLET
2.5000 mg | ORAL_TABLET | Freq: Two times a day (BID) | ORAL | Status: DC
  Administered 2013-09-14 – 2013-09-18 (×9): 2.5 mg via ORAL
  Filled 2013-09-13 (×11): qty 1

## 2013-09-13 MED ORDER — ATORVASTATIN CALCIUM 20 MG PO TABS
20.0000 mg | ORAL_TABLET | Freq: Every day | ORAL | Status: DC
  Administered 2013-09-13 – 2013-09-17 (×5): 20 mg via ORAL
  Filled 2013-09-13 (×7): qty 1

## 2013-09-13 MED ORDER — ISOSORBIDE MONONITRATE ER 60 MG PO TB24
60.0000 mg | ORAL_TABLET | Freq: Every day | ORAL | Status: DC
  Administered 2013-09-14 – 2013-09-18 (×5): 60 mg via ORAL
  Filled 2013-09-13 (×5): qty 1

## 2013-09-13 MED ORDER — SODIUM CHLORIDE 0.9 % IV SOLN
1.0000 g | Freq: Once | INTRAVENOUS | Status: DC
  Filled 2013-09-13: qty 10

## 2013-09-13 MED ORDER — POTASSIUM CHLORIDE CRYS ER 20 MEQ PO TBCR
20.0000 meq | EXTENDED_RELEASE_TABLET | Freq: Three times a day (TID) | ORAL | Status: DC
  Administered 2013-09-13 – 2013-09-16 (×10): 20 meq via ORAL
  Filled 2013-09-13 (×15): qty 1

## 2013-09-13 MED ORDER — AMIODARONE HCL 200 MG PO TABS
200.0000 mg | ORAL_TABLET | Freq: Every day | ORAL | Status: DC
  Administered 2013-09-14 – 2013-09-18 (×5): 200 mg via ORAL
  Filled 2013-09-13 (×5): qty 1

## 2013-09-13 MED ORDER — VITAMIN B-12 1000 MCG PO TABS
1000.0000 ug | ORAL_TABLET | Freq: Every day | ORAL | Status: DC
  Administered 2013-09-14 – 2013-09-18 (×5): 1000 ug via ORAL
  Filled 2013-09-13 (×5): qty 1

## 2013-09-13 MED ORDER — VITAMIN D3 25 MCG (1000 UNIT) PO TABS
1000.0000 [IU] | ORAL_TABLET | Freq: Every day | ORAL | Status: DC
  Administered 2013-09-14 – 2013-09-18 (×5): 1000 [IU] via ORAL
  Filled 2013-09-13 (×5): qty 1

## 2013-09-13 MED ORDER — WARFARIN SODIUM 5 MG PO TABS
5.0000 mg | ORAL_TABLET | ORAL | Status: DC
  Administered 2013-09-15 – 2013-09-16 (×2): 5 mg via ORAL
  Filled 2013-09-13 (×3): qty 1

## 2013-09-13 MED ORDER — WARFARIN - PHARMACIST DOSING INPATIENT: Freq: Every day | Status: DC

## 2013-09-13 MED ORDER — HYDRALAZINE HCL 50 MG PO TABS
50.0000 mg | ORAL_TABLET | Freq: Three times a day (TID) | ORAL | Status: DC
  Administered 2013-09-13 – 2013-09-18 (×14): 50 mg via ORAL
  Filled 2013-09-13 (×17): qty 1

## 2013-09-13 MED ORDER — DOXYCYCLINE HYCLATE 100 MG PO TABS
100.0000 mg | ORAL_TABLET | Freq: Two times a day (BID) | ORAL | Status: DC
  Administered 2013-09-14 – 2013-09-18 (×9): 100 mg via ORAL
  Filled 2013-09-13 (×11): qty 1

## 2013-09-13 MED ORDER — AMIODARONE HCL 200 MG PO TABS: 200.0000 mg | ORAL_TABLET | Freq: Every day | ORAL | Status: DC

## 2013-09-13 MED ORDER — VITAMIN B-12 1000 MCG PO TABS: 1000.0000 ug | ORAL_TABLET | Freq: Every day | ORAL | Status: DC

## 2013-09-13 NOTE — Telephone Encounter (Signed)
d/w Bing Neighbors. PA about pt's critcal K+ 2.5. Pa advised to have pt take an extra 60 meq today of K+ with STAT BMET 09/14/13. pt's wife notified about pt's critical K+ 2.5 from today. Per Bing Neighbors. PA advised to have pt take extra 60 meq today.  Mrs. Lonzo then states to me that well she has only been giving K+ 40 daily (she is doing 20 meq A/20 meq P). Wife states he went back to the 40 meq daily after only having to increase for 1-2 days as stated at OV 09/06/13 with Lorin Picket W. PA. I then said that when I called later that day with the lab results from 09/06/13 from the OV that [place] W. PA increased K+ to 60 meq. I stated to Mrs. Chapdelaine that 9/18 lab we discussed to have pt to only take the zaroxlyn x 1 and NOT x 2 as stated during OV 9/18 with PA and that we would increase his K+ to 60 meq daily and that she had verbally given understanding to changes. I advised Mrs. Muratore NOT to do anything yet with K+ until I d/w further with Scott and cb later today about what dose to do today with the K+. Mrs. Acree verbalized understanding to me today about this Plan of Care.

## 2013-09-13 NOTE — ED Notes (Signed)
DR.Zackowski shown critical results of Istat Troponin. ED-Lab

## 2013-09-13 NOTE — ED Notes (Signed)
Patient transported to CT. Informed [facility] that pt would come to floor after CT.

## 2013-09-13 NOTE — H&P (Signed)
Patient ID: Frank Mclean MRN: [id number], DOB/AGE: [birth year]   Admit date: 09/13/2013  Primary Physician: [name], MD Primary Cardiologist: Shela Commons. Hochrein, MD   Pt. Profile:  72 year old male with history of nonischemic cardiomyopathy and systolic heart failure who was in the office earlier today and was sent to the ED secondary to acute on chronic renal failure, hypokalemia, and progressive fatigue.  Problem List  Past Medical History  Diagnosis Date  . Gout   . HTN (hypertension)   . Hyperlipidemia   . Osteoarthritis   . History of CVA (cerebrovascular accident)     Left pontine infarct July 2004; changed from Plavix to Coumadin in 2004 per MD at [facility]  . GERD (gastroesophageal reflux disease)   . DJD (degenerative joint disease)   . BPH (benign prostatic hypertrophy)   . DM (diabetes mellitus), type 2   . Peripheral vascular disease   . Bilateral leg ulcer     ACHILLES AREA--  NONHEALING  . CKD (chronic kidney disease) stage 3, GFR 30-59 ml/min   . CAD (coronary artery disease)     a. LHC 4/12: Mid LAD 25%, mid diagonal 30%, AV circumflex 40%, proximal OM 25%, distal RCA 40%  . Chronic combined systolic and diastolic heart failure     Echocardiogram 05/2013: EF 25-30%, diffuse HK, restrictive physiology, trivial AI, mild MR, moderate LAE, reduced RV systolic function, PASP 42  . LBBB (left bundle branch block)   . Atrial fibrillation     amiodarone Rx started 05/2013  . NICM (nonischemic cardiomyopathy)     Past Surgical History  Procedure Laterality Date  . Cardiac catheterization  04-16-2011   [facility]    NON-OBSTRUCTIVE CAD. MILDLY ELEVATED PULMONARY PRESSURES/ ELEVATED  END-DIASTOLIC PRESSURE  . Transthoracic echocardiogram  12-31-2010    MODERATE CONCENTRIC LVH/ SYSTOLIC FUNCTION SEVERELY REDUCED/ EF 25-30%/  SEVERE HYPOKINESIS OF ANTEROSEPTAL MYOCARDIUM  AND ENTIREAPICAL MYOCARDIUM /  MODERATE HYPOKINESIS OF LATERAL, INFEROLATERAL, INFERIOR,AND  INFEROSEPTAL MYOCARIUM/  GRADE 3 DIASTOLIC DYSFUNCTION/ MILD MR  . Aortogram w/ bilateral lower extremitiy runoff  05-18-2013  DR [facility]EL[facility]    LEFT LEG OCCLUDED PERONEAL AND ANTERIOR TIBIAL ARTERIES/ HIGH GRADE STENOIS 80% MIDDLE AND DISTAL THIRD OF POSTERIOR TIBIAL ARTERY/ RIGHT PERONEAL AND ANTERIOR TIBIAL ARTERY OCCLUDED/ 40% STENOSIS DISTALLY     Allergies  No Known Allergies  HPI  65 ill male with the above complex problem list. He has been followed closely in our clinic of late secondary to his history of chronic systolic congestive heart failure. When he was seen approximately one week ago, his wife reported increasing lower extremity edema. His weights were stable but felt to be unreliable. He was advised to take metolazone 5 mg x1 day in addition to his regularly scheduled doses of torsemide. A chest x-ray was performed and did not show any significant edema. His proBNP was only 131. Patient says that following that single dose of metolazone, that he felt fairly well and says that he felt as though he could go out for a run. He does weigh himself at home on a daily basis and this is been stable in the 208-10 range. Over the past 3-4 days unfortunately, he is noted progressive weakness and fatigue. He's also been having dyspnea exertion when he walks across the length of his house. He was seen in clinic today and labs were sent off revealing acute on chronic renal failure with a creatinine of 3.3, BUN of 45, and severe hypokalemia with potassium of 2.5.  Patient and wife were contacted and decision was made to have him come to the ED for further evaluation. Here, he denies active chest pain or dyspnea. He does feel somewhat weak and washed out. As was noted in the office earlier, he has had intermittent left hand weakness for which a CT of the brain had been scheduled to be performed as an outpatient.  Home Medications  Prior to Admission medications   Medication Sig Start Date End Date Taking?  Authorizing Provider  amiodarone (PACERONE) 200 MG tablet Take 1 tablet (200 mg total) by mouth daily. 09/13/13  Yes Scott T Alben Spittle, PA-C  amLODipine (NORVASC) 10 MG tablet Take 1 tablet (10 mg total) by mouth daily. 06/29/13  Yes Lonia Skinner, MD  atorvastatin (LIPITOR) 20 MG tablet Take 20 mg by mouth at bedtime.   Yes Historical Provider, MD  budesonide-formoterol (SYMBICORT) 160-4.5 MCG/ACT inhaler Inhale 2 puffs into the lungs daily as needed (shortness of breath).   Yes Historical Provider, MD  cholecalciferol (VITAMIN D) 1000 UNITS tablet Take 1,000 Units by mouth daily.   Yes Historical Provider, MD  collagenase (SANTYL) ointment Apply 1 application topically 2 (two) times a week. On Monday and Friday at home (for wounds on legs) - applied Wednesday at the wound center   Yes Historical Provider, MD  cyanocobalamin 1000 MCG tablet Take 1 tablet (1,000 mcg total) by mouth daily. 06/29/13  Yes Lonia Skinner, MD  doxycycline (VIBRAMYCIN) 100 MG capsule Take 100 mg by mouth 2 (two) times daily. Continuous course for wound care 08/07/13  Yes Georgina Quint Plotnikov, MD  ferrous sulfate 325 (65 FE) MG tablet Take 1 tablet (325 mg total) by mouth daily. 07/05/13  Yes Georgina Quint Plotnikov, MD  glipiZIDE (GLUCOTROL) 5 MG tablet Take 0.5 tablets (2.5 mg total) by mouth 2 (two) times daily before a meal. 06/29/13  Yes Lonia Skinner, MD  hydrALAZINE (APRESOLINE) 50 MG tablet Take 1 tablet (50 mg total) by mouth 3 (three) times daily. 06/29/13  Yes Lonia Skinner, MD  HYDROcodone-acetaminophen (NORCO/VICODIN) 5-325 MG per tablet Take 1 tablet by mouth 2 (two) times daily as needed for pain.   Yes Historical Provider, MD  isosorbide mononitrate (IMDUR) 60 MG 24 hr tablet Take 1 tablet (60 mg total) by mouth daily. 06/29/13  Yes Lonia Skinner, MD  labetalol (NORMODYNE) 200 MG tablet Take 1 tablet (200 mg total) by mouth 2 (two) times daily. 06/29/13  Yes Lonia Skinner, MD  nitroGLYCERIN (NITROSTAT) 0.4 MG  SL tablet Place 1 tablet (0.4 mg total) under the tongue every 5 (five) minutes as needed for chest pain. 06/29/13  Yes Lonia Skinner, MD  potassium chloride SA (K-DUR,KLOR-CON) 20 MEQ tablet Take 20 mEq by mouth 3 (three) times daily.   Yes Historical Provider, MD  torsemide (DEMADEX) 100 MG tablet Take 50 mg by mouth daily.  08/24/13  Yes Georgina Quint Plotnikov, MD  triamcinolone cream (KENALOG) 0.5 % Apply 1 application topically 2 (two) times daily as needed (for skin breakdown). 06/29/13  Yes Lonia Skinner, MD  warfarin (COUMADIN) 5 MG tablet Take 2.5-5 mg by mouth every evening. Take 1/2 tablet (2.5 mg) on Monday and Friday, take 1 tablet (5 mg) on all other days   Yes Historical Provider, MD    Family History  Family History  Problem Relation Age of Onset  . Hypertension Mother   . Heart disease Mother   . Diabetes Mother   . Gout  Other   . Stroke Other   . Hypertension Father   . Heart disease Father     Social History  History   Social History  . Marital Status: Married    Spouse Name: N/A    Number of Children: N/A  . Years of Education: N/A   Occupational History  . Retired - Working in Product manager    Social History Main Topics  . Smoking status: Former Smoker    Quit date: 04/12/1974  . Smokeless tobacco: Never Used  . Alcohol Use: No     Comment: previously drank - "love cognac" quit 15 yrs ago.  . Drug Use: No  . Sexual Activity: Not on file   Other Topics Concern  . Not on file   Social History Narrative   Worked at [employer] year.  He is married and lives with his wife [name] in [place].  Has one daughter Lowella Bandy     Review of Systems General:  He has been feeling weak and washed out over the past 3-4 days. No chills, fever, night sweats or weight changes.  Cardiovascular: He is somewhat chronic dyspnea exertion is been worse over the past 3-4 days.  He has chronic lower extremity edema and is evaluated in wound clinic and has on unna boots.  He believes his edema has been stable. He denies chest pain,  orthopnea, palpitations, paroxysmal nocturnal dyspnea. Dermatological: No rash, lesions/masses Respiratory: No cough, positive dyspnea Urologic: No hematuria, dysuria Abdominal:   No nausea, vomiting, diarrhea, bright red blood per rectum, melena, or hematemesis Neurologic:  No visual changes, changes in mental status. All other systems reviewed and are otherwise negative except as noted above.  Physical Exam  Blood pressure 98/57, pulse 62, temperature 98.1 F (36.7 C), temperature source Oral, resp. rate 16, SpO2 98.00%.  General: Pleasant, NAD Psych: Normal affect. Neuro: Alert and oriented X 3. Moves all extremities spontaneously. HEENT: Normal  Neck: Supple without bruits or JVD. Lungs:  Resp regular and unlabored, CTA. Heart: RRR no s3, s4, or murmurs. Abdomen: Soft, non-tender, non-distended, BS + x 4.  Extremities: No clubbing, cyanosis.  His legs are wrapped. No significant pedal edema noted. DP/PT/Radials 1+ and equal bilaterally.  Labs  No results found for this basename: CKTOTAL, CKMB, TROPONINI,  in the last 72 hours Lab Results  Component Value Date   WBC 11.4* 09/13/2013   HGB 10.5* 09/13/2013   HCT 33.2* 09/13/2013   MCV 77.7* 09/13/2013   PLT 287.0 09/13/2013    Recent Labs Lab 09/13/13 1240  NA 143  K 2.5*  CL 95*  CO2 36*  BUN 45*  CREATININE 3.3*  CALCIUM 9.6  GLUCOSE 103*   Lab Results  Component Value Date   CHOL 133 04/17/2013   HDL 42.20 04/17/2013   LDLCALC 70 04/17/2013   TRIG 103.0 04/17/2013   No results found for this basename: DDIMER     Radiology/Studies  Dg Chest 2 View  09/06/2013   *RADIOLOGY REPORT*  Clinical Data: Lower extremity edema, cough, shortness of breath, former smoker, history of CHF and CAD - initial encounter.  CHEST - 2 VIEW  Comparison: 06/10/2023; 12/29/2012; 04/13/2011  Findings:  Grossly unchanged enlarged cardiac silhouette and mediastinal contours with  mild tortuosity of the thoracic aorta. Evaluation of the retrosternal clear space obscured secondary to overlying soft tissues.  The lungs appear hyperexpanded with flattening of the hemidiaphragms. Perihilar and bibasilar opacities are grossly unchanged, right greater than left.  No  new focal airspace opacities.  No pleural effusion or pneumothorax.  No definite evidence of edema.  Unchanged bones.  IMPRESSION: 1.  Hyperexpanded lungs and perihilar atelectasis without definite acute cardiopulmonary disease.  2.  Stable findings of cardiomegaly without evidence of edema.   Original Report Authenticated By: Tacey Ruiz, MD   ECG  Regular sinus rhythm, 61, left bundle branch block, left axis no acute ST or T changes.  ASSESSMENT AND PLAN  1. Acute on chronic renal failure: In the setting of chronic systolic CHF and oral diuretic therapy. Patient been feeling worse at the past 3-4 days with his biggest complaint being weakness and fatigue. He's also had some dyspnea in that setting. His weights have been stable and if anything trending down. He was treated with metolazone about a week ago due to volume overload. Currently dehydrated and we will hold his diuretics and hydrate him tonight and followup renal function the morning. He doesn't have significant improvement in his creatinine, we will plan to consult nephrology in the a.m.  2. Hypokalemia: We'll provide him with 40 mEq orally now and followup his labs in the a.m.   3. Chronic systolic congestive heart care: He appears to be euvolemic on exam. In the setting of dehydration, his pressures are currently soft. We'll continue his beta blocker, hydralazine, nitrate and though he will require hold parameters. Holding all diuretics.  4. Hypertension: Holding diuretics and will have to hold Norvasc as well. Place triple parameters on his beta blocker hydralazine and nitrate.  5. A. fibrillation: On amiodarone and Coumadin. He is in sinus.  6.  Bilateral lower extremity wounds: We'll ask wound management is seen a.m.  7.  Wkns:  Suspect primarily 2/2 #1, though he also specifically reports left hand wkns.  Neuro exam nl.  As prev planned, will check head CT to r/o recent CVA.  Signed, Nicolasa Ducking, NP 09/13/2013, 7:46 PM  Patient examined chart reviewed.  Discussed plan of care with patient, family and PA. On relatively high doses of demedex with recent addition of some zaroxyln.  Pre renal with recently low BNP.  Cr elevated ? Beyond pre renal state.  Hold diuretics , hydrate gently and supple K slowly given degree of renal impairment. If no improvement consider renal dose dopamine and renal consult with [place].  Recent amiodarone labs ok including TSH.  In NSR on Rx coumadin.  Sees wound care on [place] with nurse visits 2x/week.  In house consult.  ? Is low EF an absolute contraindication to hyperbaric oxygen Rx.    Charlton Haws

## 2013-09-13 NOTE — ED Provider Notes (Signed)
I saw and evaluated the patient, reviewed the resident's note and I agree with the findings and plan.  Results for orders placed during the hospital encounter of 09/13/13  CBC WITH DIFFERENTIAL      Result Value Range   WBC 10.1  4.0 - 10.5 K/uL   RBC 3.96 (*) 4.22 - 5.81 MIL/uL   Hemoglobin 9.7 (*) 13.0 - 17.0 g/dL   HCT 16.1 (*) 09.6 - 04.5 %   MCV 77.8 (*) 78.0 - 100.0 fL   MCH 24.5 (*) 26.0 - 34.0 pg   MCHC 31.5  30.0 - 36.0 g/dL   RDW 40.9 (*) 81.1 - 91.4 %   Platelets 262  150 - 400 K/uL   Neutrophils Relative % 82 (*) 43 - 77 %   Neutro Abs 8.3 (*) 1.7 - 7.7 K/uL   Lymphocytes Relative 10 (*) 12 - 46 %   Lymphs Abs 1.0  0.7 - 4.0 K/uL   Monocytes Relative 7  3 - 12 %   Monocytes Absolute 0.7  0.1 - 1.0 K/uL   Eosinophils Relative 1  0 - 5 %   Eosinophils Absolute 0.1  0.0 - 0.7 K/uL   Basophils Relative 0  0 - 1 %   Basophils Absolute 0.0  0.0 - 0.1 K/uL  COMPREHENSIVE METABOLIC PANEL      Result Value Range   Sodium 143  135 - 145 mEq/L   Potassium 2.8 (*) 3.5 - 5.1 mEq/L   Chloride 97  96 - 112 mEq/L   CO2 31  19 - 32 mEq/L   Glucose, Bld 87  70 - 99 mg/dL   BUN 50 (*) 6 - 23 mg/dL   Creatinine, Ser 7.82 (*) 0.50 - 1.35 mg/dL   Calcium 8.9  8.4 - 95.6 mg/dL   Total Protein 6.7  6.0 - 8.3 g/dL   Albumin 3.5  3.5 - 5.2 g/dL   AST 15  0 - 37 U/L   ALT 15  0 - 53 U/L   Alkaline Phosphatase 90  39 - 117 U/L   Total Bilirubin 0.2 (*) 0.3 - 1.2 mg/dL   GFR calc non Af Amer 16 (*) >90 mL/min   GFR calc Af Amer 18 (*) >90 mL/min  TROPONIN I      Result Value Range   Troponin I <0.30  <0.30 ng/mL  MAGNESIUM      Result Value Range   Magnesium 1.8  1.5 - 2.5 mg/dL  PROTIME-INR      Result Value Range   Prothrombin Time 24.6 (*) 11.6 - 15.2 seconds   INR 2.31 (*) 0.00 - 1.49  POCT I-STAT TROPONIN I      Result Value Range   Troponin i, poc 0.12 (*) 0.00 - 0.08 ng/mL   Comment NOTIFIED PHYSICIAN     Comment 3            The patient seen by me. Patient referred in  for low potassium 2.5 with your cardiology group. Here it was 2.8. Patient will receive IV potassium. Patient had a wide QRS complex on EKG consistent with his left bundle branch block. Troponin was 1.8 care was elevated but her troponin was normal. No evidence of acute cardiac event. EKG also showed no sniffing changes concur with resident's interpretation of EKG. Calcium gluconate was ordered but patient never received it. Calcium gluconate as we know before for the treatment of hyperkalemia patient's problem is more hypokalemia and will need potassium supplement. Mission  done by cardiology.   Shelda Jakes, MD 09/13/13 2116

## 2013-09-13 NOTE — ED Notes (Signed)
Pt family states he went to see his PCP this morning for left hand numbness x 3 days. Pt states no longer numb currently. Pt had blood work at PCP and was called back at 17:00 to come in and be admitted. Pt CMP is completely abnormal (results in EPIC.)

## 2013-09-13 NOTE — Telephone Encounter (Signed)
Follow Up:  Pt's wife states she is waiting on a call from the nurse to let her know how much K+ to give to her husband. Please call pt back with correct dosage

## 2013-09-13 NOTE — Progress Notes (Signed)
ANTICOAGULATION CONSULT NOTE - Initial Consult  Pharmacy Consult for coumadin Indication: hx CVA, afib  No Known Allergies   Vital Signs: Temp: 98.1 F (36.7 C) (09/25 1909) Temp src: Oral (09/25 1909) BP: 98/57 mmHg (09/25 1909) Pulse Rate: 62 (09/25 1909)  Labs:  Recent Labs  09/13/13 1240 09/13/13 1908 09/13/13 2005  HGB 10.5* 9.7*  --   HCT 33.2* 30.8*  --   PLT 287.0 262  --   CREATININE 3.3* 3.54*  --   TROPONINI  --   --  <0.30    The CrCl is unknown because both a height and weight (above a minimum accepted value) are required for this calculation.   Medical History: Past Medical History  Diagnosis Date  . Gout   . HTN (hypertension)   . Hyperlipidemia   . Osteoarthritis   . History of CVA (cerebrovascular accident)     Left pontine infarct July 2004; changed from Plavix to Coumadin in 2004 per MD at [facility]  . GERD (gastroesophageal reflux disease)   . DJD (degenerative joint disease)   . BPH (benign prostatic hypertrophy)   . DM (diabetes mellitus), type 2   . Peripheral vascular disease   . Bilateral leg ulcer     ACHILLES AREA--  NONHEALING  . CKD (chronic kidney disease) stage 3, GFR 30-59 ml/min   . CAD (coronary artery disease)     a. LHC 4/12: Mid LAD 25%, mid diagonal 30%, AV circumflex 40%, proximal OM 25%, distal RCA 40%  . Chronic combined systolic and diastolic heart failure     Echocardiogram 05/2013: EF 25-30%, diffuse HK, restrictive physiology, trivial AI, mild MR, moderate LAE, reduced RV systolic function, PASP 42  . LBBB (left bundle branch block)   . Atrial fibrillation     amiodarone Rx started 05/2013  . NICM (nonischemic cardiomyopathy)     Medications:  See med rec  Assessment: Patient is a 72 y.o M on coumadin PTA for hx of CVA, afib.  Pt's wife reported that his home coumadin regimen is 5mg  daily except 2.5mg  on Mondays and Fridays with dose taken today prior to coming in to the ED.  INR is therapeutic at 2.31  tonight.  Goal of Therapy:  INR 2-3    Plan:  1) Since INR is therapeutic, will resume home dose 5mg  daily except with 2.5mg  on Mondays and Fridays (to start on 9/26)  Mariann Palo P 09/13/2013,8:33 PM

## 2013-09-13 NOTE — Patient Instructions (Addendum)
PLEASE SCHEDULE TO HAVE A HEAD CT W/O CONTRAST DX NEW LEFT HAND WEAKNESS, H/O CVA  PLEASE FOLLOW UP WITH DR. HOCHREIN IN 4-6 WEEKS  LABS TODAY; BMET, CBC W/DIFF YOU WILL NEED TO DO A URIN ANALYSIS TODAY  DECREASE AMIODARONE TO 200 MG DAILY

## 2013-09-13 NOTE — ED Provider Notes (Signed)
CSN: [id number]     Arrival date & time 09/13/13  1901 History   First MD Initiated Contact with Patient 09/13/13 1915     Chief Complaint  Patient presents with  . Abnormal Lab   (Consider location/radiation/quality/duration/timing/severity/associated sxs/prior Treatment) Patient is a 72 y.o. male presenting with weakness. The history is provided by the patient and medical records.  Weakness This is a new problem. The current episode started in the past 7 days. The problem occurs constantly. The problem has been gradually worsening. Associated symptoms include fatigue and weakness. Pertinent negatives include no abdominal pain, arthralgias, chest pain, congestion, coughing, fever, myalgias, nausea, numbness, rash or vomiting. The symptoms are aggravated by exertion. He has tried nothing for the symptoms. The treatment provided no relief.    Past Medical History  Diagnosis Date  . Gout   . HTN (hypertension)   . Hyperlipidemia   . Osteoarthritis   . History of CVA (cerebrovascular accident)     Left pontine infarct July 2004; changed from Plavix to Coumadin in 2004 per MD at [facility]  . GERD (gastroesophageal reflux disease)   . DJD (degenerative joint disease)   . BPH (benign prostatic hypertrophy)   . DM (diabetes mellitus), type 2   . Peripheral vascular disease   . Bilateral leg ulcer     ACHILLES AREA--  NONHEALING  . CKD (chronic kidney disease) stage 3, GFR 30-59 ml/min   . CAD (coronary artery disease)     a. LHC 4/12: Mid LAD 25%, mid diagonal 30%, AV circumflex 40%, proximal OM 25%, distal RCA 40%  . Chronic combined systolic and diastolic heart failure     Echocardiogram 05/2013: EF 25-30%, diffuse HK, restrictive physiology, trivial AI, mild MR, moderate LAE, reduced RV systolic function, PASP 42  . LBBB (left bundle branch block)   . Atrial fibrillation     amiodarone Rx started 05/2013  . NICM (nonischemic cardiomyopathy)    Past Surgical History  Procedure  Laterality Date  . Cardiac catheterization  04-16-2011   [facility]    NON-OBSTRUCTIVE CAD. MILDLY ELEVATED PULMONARY PRESSURES/ ELEVATED  END-DIASTOLIC PRESSURE  . Transthoracic echocardiogram  12-31-2010    MODERATE CONCENTRIC LVH/ SYSTOLIC FUNCTION SEVERELY REDUCED/ EF 25-30%/  SEVERE HYPOKINESIS OF ANTEROSEPTAL MYOCARDIUM  AND ENTIREAPICAL MYOCARDIUM /  MODERATE HYPOKINESIS OF LATERAL, INFEROLATERAL, INFERIOR,AND INFEROSEPTAL MYOCARIUM/  GRADE 3 DIASTOLIC DYSFUNCTION/ MILD MR  . Aortogram w/ bilateral lower extremitiy runoff  05-18-2013  DR [facility]ELDS    LEFT LEG OCCLUDED PERONEAL AND ANTERIOR TIBIAL ARTERIES/ HIGH GRADE STENOIS 80% MIDDLE AND DISTAL THIRD OF POSTERIOR TIBIAL ARTERY/ RIGHT PERONEAL AND ANTERIOR TIBIAL ARTERY OCCLUDED/ 40% STENOSIS DISTALLY    Family History  Problem Relation Age of Onset  . Hypertension Mother   . Heart disease Mother   . Diabetes Mother   . Gout Other   . Stroke Other   . Hypertension Father   . Heart disease Father    History  Substance Use Topics  . Smoking status: Former Smoker    Quit date: 04/12/1974  . Smokeless tobacco: Never Used  . Alcohol Use: No     Comment: previously drank - "love cognac" quit 15 yrs ago.    Review of Systems  Constitutional: Positive for fatigue. Negative for fever and appetite change.  HENT: Negative for congestion.   Respiratory: Negative for cough.   Cardiovascular: Negative for chest pain.  Gastrointestinal: Negative for nausea, vomiting, abdominal pain, diarrhea and constipation.  Genitourinary: Negative for dysuria, urgency, decreased  urine volume and difficulty urinating.  Musculoskeletal: Negative for myalgias, arthralgias and gait problem.  Skin: Negative for color change, pallor, rash and wound.  Neurological: Positive for weakness. Negative for numbness.  All other systems reviewed and are negative.    Allergies  Review of patient's allergies indicates no known allergies.  Home Medications    Current Outpatient Rx  Name  Route  Sig  Dispense  Refill  . amiodarone (PACERONE) 200 MG tablet   Oral   Take 1 tablet (200 mg total) by mouth daily.         [name] amLODipine (NORVASC) 10 MG tablet   Oral   Take 1 tablet (10 mg total) by mouth daily.   30 tablet   11     90 day supply is acceptable   . atorvastatin (LIPITOR) 20 MG tablet   Oral   Take 20 mg by mouth at bedtime.         . budesonide-formoterol (SYMBICORT) 160-4.5 MCG/ACT inhaler   Inhalation   Inhale 2 puffs into the lungs daily as needed (shortness of breath).         . cholecalciferol (VITAMIN D) 1000 UNITS tablet   Oral   Take 1,000 Units by mouth daily.         . collagenase (SANTYL) ointment   Topical   Apply 1 application topically 2 (two) times a week. On Monday and Friday at home (for wounds on legs) - applied Wednesday at the wound center         . cyanocobalamin 1000 MCG tablet   Oral   Take 1 tablet (1,000 mcg total) by mouth daily.   100 tablet   3   . doxycycline (VIBRAMYCIN) 100 MG capsule   Oral   Take 100 mg by mouth 2 (two) times daily. Continuous course for wound care         . ferrous sulfate 325 (65 FE) MG tablet   Oral   Take 1 tablet (325 mg total) by mouth daily.   30 tablet   6   . glipiZIDE (GLUCOTROL) 5 MG tablet   Oral   Take 0.5 tablets (2.5 mg total) by mouth 2 (two) times daily before a meal.   30 tablet   3   . hydrALAZINE (APRESOLINE) 50 MG tablet   Oral   Take 1 tablet (50 mg total) by mouth 3 (three) times daily.   90 tablet   0   . HYDROcodone-acetaminophen (NORCO/VICODIN) 5-325 MG per tablet   Oral   Take 1 tablet by mouth 2 (two) times daily as needed for pain.         . isosorbide mononitrate (IMDUR) 60 MG 24 hr tablet   Oral   Take 1 tablet (60 mg total) by mouth daily.   30 tablet   0   . labetalol (NORMODYNE) 200 MG tablet   Oral   Take 1 tablet (200 mg total) by mouth 2 (two) times daily.   60 tablet   2   . nitroGLYCERIN  (NITROSTAT) 0.4 MG SL tablet   Sublingual   Place 1 tablet (0.4 mg total) under the tongue every 5 (five) minutes as needed for chest pain.   4 tablet   0   . potassium chloride SA (K-DUR,KLOR-CON) 20 MEQ tablet   Oral   Take 20 mEq by mouth 3 (three) times daily.         [name] torsemide (DEMADEX) 100 MG tablet   Oral  Take 50 mg by mouth daily.          [name] triamcinolone cream (KENALOG) 0.5 %   Topical   Apply 1 application topically 2 (two) times daily as needed (for skin breakdown).   30 g   0   . warfarin (COUMADIN) 5 MG tablet   Oral   Take 2.5-5 mg by mouth every evening. Take 1/2 tablet (2.5 mg) on Monday and Friday, take 1 tablet (5 mg) on all other days          BP 142/68  Pulse 63  Temp(Src) 98.1 F (36.7 C) (Oral)  Resp 15  SpO2 98% Physical Exam  Constitutional: He is oriented to person, place, and time. He appears well-developed and well-nourished. No distress.  HENT:  Head: Normocephalic and atraumatic.  Mouth/Throat: No oropharyngeal exudate.  Eyes: Conjunctivae and EOM are normal. Pupils are equal, round, and reactive to light.  Neck: Normal range of motion. Neck supple.  Cardiovascular: Normal rate and normal heart sounds.  An irregularly irregular rhythm present. Exam reveals no gallop and no friction rub.   No murmur heard. Pulmonary/Chest: Effort normal and breath sounds normal. No respiratory distress. He has no wheezes. He has no rales.  Abdominal: Soft. He exhibits no distension. There is no tenderness.  Musculoskeletal: He exhibits edema and tenderness.  Lower extremities with chronic ulcers bandaged from the feet to the knees.  Lymphadenopathy:    He has no cervical adenopathy.  Neurological: He is alert and oriented to person, place, and time. He has normal strength. He displays no tremor. No cranial nerve deficit or sensory deficit. He exhibits normal muscle tone. Coordination (limited by pain) and gait (walks with a walker) abnormal. GCS eye  subscore is 4. GCS verbal subscore is 5. GCS motor subscore is 6.  Skin: Skin is warm and dry. No rash noted. He is not diaphoretic.  Psychiatric: He has a normal mood and affect. His behavior is normal. Judgment normal.    ED Course  Procedures (including critical care time) Labs Review Labs Reviewed  CBC WITH DIFFERENTIAL - Abnormal; Notable for the following:    RBC 3.96 (*)    Hemoglobin 9.7 (*)    HCT 30.8 (*)    MCV 77.8 (*)    MCH 24.5 (*)    RDW 16.0 (*)    Neutrophils Relative % 82 (*)    Neutro Abs 8.3 (*)    Lymphocytes Relative 10 (*)    All other components within normal limits  COMPREHENSIVE METABOLIC PANEL - Abnormal; Notable for the following:    Potassium 2.8 (*)    BUN 50 (*)    Creatinine, Ser 3.54 (*)    Total Bilirubin 0.2 (*)    GFR calc non Af Amer 16 (*)    GFR calc Af Amer 18 (*)    All other components within normal limits  PROTIME-INR - Abnormal; Notable for the following:    Prothrombin Time 24.6 (*)    INR 2.31 (*)    All other components within normal limits  POCT I-STAT TROPONIN I - Abnormal; Notable for the following:    Troponin i, poc 0.12 (*)    All other components within normal limits  TROPONIN I  MAGNESIUM  PROTIME-INR   Imaging Review Ct Head Wo Contrast  09/13/2013   CLINICAL DATA:  Left hand weakness  EXAM: CT HEAD WITHOUT CONTRAST  TECHNIQUE: Contiguous axial images were obtained from the base of the skull through the vertex  without intravenous contrast.  COMPARISON:  06/09/2013  FINDINGS: Mild cortical volume loss noted with proportional ventricular prominence. Areas of periventricular white matter hypodensity are most compatible with small vessel ischemic change. Bilateral basal ganglia lacunar infarcts are stable. No midline shift. Vertebrobasilar dolichoectasia noted. Orbits and paranasal sinuses are intact. No skull fracture.  IMPRESSION: No acute intracranial finding or interval change.   Electronically Signed   By: Christiana Pellant M.D.   On: 09/13/2013 21:29    MDM   1. Hypokalemia   2. Weakness   3. Acute on chronic kidney disease, stage 3      Date: 09/13/2013  Rate: 61  Rhythm: normal sinus rhythm  QRS Axis: left  Intervals: QT prolonged  ST/T Wave abnormalities: nonspecific ST changes  Conduction Disutrbances:nonspecific intraventricular conduction delay  Narrative Interpretation:   Old EKG Reviewed: unchanged  The patient is a 72 year old male with significant past medical history type 2 diabetes, hypertension, coronary artery disease, hypokalemia who presents as a referral from clinic for hypokalemia. For the past 3-4 days the patient has been increasingly weak, but most noticeably in his left hand but also diffusely with some associated shortness of breath. For the past 2 weeks he has been using his Zaroxolyn twice a day instead of once a day. He has also been taking potassium 40 mg once daily instead of 60 mg once daily as directed. He denies chest pain, dyspnea, nausea, vomiting, numbness, tingling.  On arrival patient is afebrile and hemodynamically stable. He is well appearing and has a nonfocal neurologic exam. Strength is equal bilaterally he has no numbness in the left hand. I am not concerned for CVA in this patient. Symptoms are most likely secondary to his hypokalemia which was diagnosed in clinic. Cardiology was present on arrival and we will consult with them for further management this patient's hypokalemia. Labs were repeated as well as troponin and EKG. EKG documented above shows some QT prolongation but is not significantly changed from previous. This point based on asymptomatic as well as unchanged EKG, will hold off on treating with calcium. Anticipate cardiology admission.  Labs show acute on chronic kidney disease. 40 potassium PO given for hypokalemia in conjunction with cardiology requests. Labs also show therapeutic INR and elevated istat trop - repeated with regular troponin and  negative. CT head, requested by Cardiology to rule out CVA based on left hand symptoms, returned showing no acute intracranial findings. Feel symptoms likely 2/2 hypokalemia. Admitted to cardiology in stable condition.  Patient was discussed with my attending, Dr. Deretha Emory.  Dorna Leitz, MD 09/13/13 2147

## 2013-09-13 NOTE — Telephone Encounter (Signed)
Per Bing Neighbors. PA he d/w Dr. Antoine Poche about 2.5 K+ for pt and they have agreed for pt to go to ED and to be admitted.  [facility] Master Trish/Chris Berg, on call notified about pt coming over and to be seen by Cards as well. Bing Neighbors. PA d/w Marvis Repress, NP about pt and his health condition.

## 2013-09-13 NOTE — Progress Notes (Addendum)
[address] [place], [place]  [address] Phone: [phone] Fax:  [phone]  Date:  09/13/2013   ID:  Azaan, Leask February 22, 1941, MRN [id number]  PCP:  [name], MD  Cardiologist:  Dr. Rollene Rotunda     History of Present Illness: Frank Mclean is a 72 y.o. male who returns for f/u on increased edema and dyspnea.  He has a hx of chronic combined systolic and diastolic CHF, NICM, nonobstructive CAD, AFib, CAD, HTN, HL, PAD. LHC 4/12:  mLAD 25%, mDx 30%, AVCFX 40%, pOM 25%, dRCA 40%. Echocardiogram 05/2013: EF 25-30%, diffuse HK, restrictive physiology, trivial AI, mild MR, mod LAE, reduced RVSF, PASP 42.  Last admission 05/2013 for atrial fibrillation with RVR complicated by type II non-STEMI and a/c combined systolic and diastolic CHF. He was placed on amiodarone at that time with restoration of NSR. Last seen by Dr. Antoine Poche 08/24/13.  He was maintaining sinus rhythm. Amiodarone dose was reduced.  I saw him last week for increasing LE edema.  He has been going to the wound center due to bilateral LE ulcers. He is wearing UNNA boots.  He had taken an extra dose of furosemide 20 mg x1. He had minimal improvement with this. He also noted increased dyspnea with exertion and was sleeping on 3 pillows (+1) recently.  I added Metolazone 5 mg x 1 for 2 days to his regimen.  CXR demonstrated no edema.  His BNP was not significantly elevated and I had him take Metolazone just one day.    He is here with his wife.  Frank Mclean notes that he is fairly weak.  He seems to be getting weaker.  He had to come up in a wheelchair today.  He notes DOE with minimal activities.  He is likely NYHA Class III.  No syncope.  He denies orthopnea, PND.  LE edema is really unchanged.  He continues to go to the wounds center.  His wife says that had to "cut some ligaments yesterday."  His ulcers are marginally better.  He denies fevers.  He does note L hand weakness.  No visual loss or speech difficulty.  No  fevers.  He has a fairly non-productive cough.    Labs (7/14):   Hgb 10.9 Labs (9/14):   K 3=>3.2, creatinine 2.5=>2.6, ALT 14, TSH 0.90, proBNP 131  Wt Readings from Last 3 Encounters:  09/13/13 206 lb (93.441 kg)  09/06/13 208 lb (94.348 kg)  08/24/13 21 lb 1.3 oz (9.562 kg)     Past Medical History  Diagnosis Date  . Gout   . HTN (hypertension)   . Hyperlipidemia   . Osteoarthritis   . History of CVA (cerebrovascular accident)     Left pontine infarct July 2004; changed from Plavix to Coumadin in 2004 per MD at [facility]  . GERD (gastroesophageal reflux disease)   . DJD (degenerative joint disease)   . BPH (benign prostatic hypertrophy)   . Non-ischemic cardiomyopathy   . DM (diabetes mellitus), type 2   . Peripheral vascular disease   . Bilateral leg ulcer     ACHILLES AREA--  NONHEALING  . CKD (chronic kidney disease) stage 3, GFR 30-59 ml/min   . CAD (coronary artery disease) CARDIOLOGIST-  [facility]    LHC 4/12: Mid LAD 25%, mid diagonal 30%, AV circumflex 40%, proximal OM 25%, distal RCA 40%  . Chronic combined systolic and diastolic heart failure     Echocardiogram 05/2013: EF 25-30%, diffuse  HK, restrictive physiology, trivial AI, mild MR, moderate LAE, reduced RV systolic function, PASP 42  . LBBB (left bundle branch block)   . Atrial fibrillation     amiodarone Rx started 05/2013    Current Outpatient Prescriptions  Medication Sig Dispense Refill  . amiodarone (PACERONE) 200 MG tablet Take 1 tablet (200 mg total) by mouth 2 (two) times daily.  60 tablet  0  . amLODipine (NORVASC) 10 MG tablet Take 1 tablet (10 mg total) by mouth daily.  30 tablet  11  . atorvastatin (LIPITOR) 20 MG tablet Take 1 tablet (20 mg total) by mouth daily. For cholesterol  30 tablet  3  . budesonide-formoterol (SYMBICORT) 160-4.5 MCG/ACT inhaler Inhale 2 puffs into the lungs 2 (two) times daily.  1 Inhaler  5  . Cholecalciferol 1000 UNITS tablet Take 1 tablet (1,000 Units total) by mouth  daily.  30 tablet  2  . collagenase (SANTYL) ointment Apply 1 application topically 2 (two) times a week.      . cyanocobalamin 1000 MCG tablet Take 1 tablet (1,000 mcg total) by mouth daily.  100 tablet  3  . doxycycline (VIBRAMYCIN) 100 MG capsule as directed.       . ferrous sulfate 325 (65 FE) MG tablet Take 1 tablet (325 mg total) by mouth daily.  30 tablet  6  . Fluticasone Furoate-Vilanterol (BREO ELLIPTA) 100-25 MCG/INH AEPB Inhale 1 puff into the lungs daily as needed (for breathing problems).      [name] glipiZIDE (GLUCOTROL) 5 MG tablet Take 0.5 tablets (2.5 mg total) by mouth 2 (two) times daily before a meal.  30 tablet  3  . hydrALAZINE (APRESOLINE) 50 MG tablet Take 1 tablet (50 mg total) by mouth 3 (three) times daily.  90 tablet  0  . HYDROcodone-acetaminophen (NORCO/VICODIN) 5-325 MG per tablet Take 1 tablet by mouth every 6 (six) hours as needed for pain.  120 tablet  1  . isosorbide mononitrate (IMDUR) 60 MG 24 hr tablet Take 1 tablet (60 mg total) by mouth daily.  30 tablet  0  . labetalol (NORMODYNE) 200 MG tablet Take 1 tablet (200 mg total) by mouth 2 (two) times daily.  60 tablet  2  . metolazone (ZAROXOLYN) 5 MG tablet as needed. TAKE 1 TABLET 30 MINUTES BEFORE YOU TAKE YOUR MORNING TORSEMIDE      . nitroGLYCERIN (NITROSTAT) 0.4 MG SL tablet Place 1 tablet (0.4 mg total) under the tongue every 5 (five) minutes as needed for chest pain.  4 tablet  0  . potassium chloride SA (K-DUR,KLOR-CON) 20 MEQ tablet Take 3 tablets (60 mEq total) by mouth daily.      [name] torsemide (DEMADEX) 100 MG tablet 50 mg 2 (two) times daily.       [name] triamcinolone cream (KENALOG) 0.5 % Apply 1 application topically 2 (two) times daily as needed (for skin breakdown).  30 g  0  . warfarin (COUMADIN) 5 MG tablet Take 1 tablet (5 mg total) by mouth daily. Takes 5 mg daily starting Sunday 7/13, then recheck next week  30 tablet  0   No current facility-administered medications for this visit.    Allergies:    No Known Allergies  Social History:  The patient  reports that he quit smoking about 39 years ago. He has never used smokeless tobacco. He reports that he does not drink alcohol or use illicit drugs.   ROS:  Please see the history of present illness.  He notes some constipation.  No melena, hematochezia, hematuria.   All other systems reviewed and negative.   PHYSICAL EXAM: VS:  BP 137/76  Pulse 59  Ht 6' 2.5" (1.892 m)  Wt 206 lb (93.441 kg)  BMI 26.1 kg/m2 Well nourished, well developed, in no acute distress HEENT: normal Neck: no JVD at 90 Cardiac:  normal S1, S2; RRR; no murmur; no rub Lungs:  Decreased breath sounds at the bases bilaterally, no wheezing, no rales Abd: soft, nontender, no hepatomegaly Ext: 1+ bilateral LE edema; legs are wrapped in UNNA boots Skin: warm and dry Neuro:  CNs 2-12 intact, face is symmetrical, L hand grip strength is somewhat reduced but all other muscle groups are normal and equal bilaterally  EKG:  Sinus brady, HR 59, LBBB     ASSESSMENT AND PLAN:  1. Weakness: Etiology is not entirely clear. He does have some left hand weakness. He does have a history of stroke as well as a history of atrial fibrillation. He is currently in normal sinus rhythm. I asked Dr. Antoine Poche, who knows him well, also to see the patient today.  We have scheduled a head CT without contrast. We did obtain a CBC and basic metabolic panel today. I have recommended that he decrease his amiodarone to 200 mg daily. We'll also check a urinalysis to rule out the possibility of UTI contributing to his symptoms. 2. Chronic Combined Systolic and Diastolic CHF:  Volume overall appears stable. Continue current dose of diuretics. Check a followup basic metabolic panel today. 3. CAD:  No angina. He is not on aspirin as he is on Coumadin. Continue statin. 4. Atrial Fibrillation:  Maintaining sinus rhythm. He remains on Coumadin. Recent amiodarone surveillance labs were optimal.  Reduce  amiodarone to 200 mg daily as noted. 5. Chronic Kidney Disease:  Check a followup basic metabolic panel. 6. Hypertension:  Controlled. 7. Hyperlipidemia:  Continue statin. 8. Lower Extremity Ulcers:  Continue follow up with the Wound Center. 9. Disposition: Follow up Dr. Antoine Poche in 4-6 weeks.  Signed, Tereso Newcomer, PA-C  09/13/2013 11:29 AM    Addendum 09/13/2013 5:44 PM: Labs from today were abnormal as noted below.  BMET    Component Value Date/Time   NA 143 09/13/2013 1240   K 2.5* 09/13/2013 1240   CL 95* 09/13/2013 1240   CO2 36* 09/13/2013 1240   GLUCOSE 103* 09/13/2013 1240   BUN 45* 09/13/2013 1240   CREATININE 3.3* 09/13/2013 1240   CALCIUM 9.6 09/13/2013 1240   GFRNONAA 28* 06/13/2013 0350   GFRAA 32* 06/13/2013 0350     CBC    Component Value Date/Time   WBC 11.4* 09/13/2013 1240   RBC 4.27 09/13/2013 1240   HGB 10.5* 09/13/2013 1240   HCT 33.2* 09/13/2013 1240   PLT 287.0 09/13/2013 1240   MCV 77.7* 09/13/2013 1240   MCH 24.7* 06/10/2013 0001   MCHC 31.8 09/13/2013 1240   RDW 17.1* 09/13/2013 1240   LYMPHSABS 1.0 09/13/2013 1240   MONOABS 0.7 09/13/2013 1240   EOSABS 0.0 09/13/2013 1240   BASOSABS 0.0 09/13/2013 1240     I reviewed his case further with Dr. Antoine Poche. Given his worsening renal function, significant hypokalemia and global symptoms of weakness in the setting of chronic heart failure and multiple co-morbidities, we have recommended admission to the hospital. I spoke to the patient's wife this evening. Frank Mclean will bring him to the emergency room. Of note, I did tell her to give him potassium 40 mEq extra,  now. Frank Mclean agrees to bring him to the emergency room.  I have alerted our staff.  He will be admitted to cardiology. Signed,  Tereso Newcomer, PA-C   09/13/2013 5:46 PM     History and all data above reviewed.  Patient examined.  I agree with the findings as above.  The patient exam reveals COR:RRR  ,  Lungs: Clear  ,  Abd: Positive bowel sounds, no rebound no guarding,  Ext moderate edema  .  All available labs, radiology testing, previous records reviewed. Agree with documented assessment and plan. The patient has had increased weakness and has not responded to further diuresis.  We are going to check a BMET as above.  I am concerned about some other underlying process such as UTI.    Fayrene Fearing Hochrein  5:59 PM  09/13/2013

## 2013-09-14 DIAGNOSIS — E876 Hypokalemia: Secondary | ICD-10-CM

## 2013-09-14 DIAGNOSIS — I509 Heart failure, unspecified: Secondary | ICD-10-CM | POA: Diagnosis not present

## 2013-09-14 DIAGNOSIS — I5043 Acute on chronic combined systolic (congestive) and diastolic (congestive) heart failure: Secondary | ICD-10-CM

## 2013-09-14 DIAGNOSIS — N179 Acute kidney failure, unspecified: Secondary | ICD-10-CM | POA: Diagnosis not present

## 2013-09-14 LAB — GLUCOSE, CAPILLARY
Glucose-Capillary: 103 mg/dL — ABNORMAL HIGH (ref 70–99)
Glucose-Capillary: 120 mg/dL — ABNORMAL HIGH (ref 70–99)
Glucose-Capillary: 130 mg/dL — ABNORMAL HIGH (ref 70–99)

## 2013-09-14 LAB — CBC
HCT: 31.5 % — ABNORMAL LOW (ref 39.0–52.0)
Hemoglobin: 9.8 g/dL — ABNORMAL LOW (ref 13.0–17.0)
MCH: 24.4 pg — ABNORMAL LOW (ref 26.0–34.0)
MCHC: 31.1 g/dL (ref 30.0–36.0)

## 2013-09-14 LAB — BASIC METABOLIC PANEL
BUN: 53 mg/dL — ABNORMAL HIGH (ref 6–23)
CO2: 28 mEq/L (ref 19–32)
Calcium: 9.2 mg/dL (ref 8.4–10.5)
GFR calc non Af Amer: 17 mL/min — ABNORMAL LOW (ref >90)
Glucose, Bld: 110 mg/dL — ABNORMAL HIGH (ref 70–99)

## 2013-09-14 MED ORDER — POTASSIUM CHLORIDE CRYS ER 20 MEQ PO TBCR
40.0000 meq | EXTENDED_RELEASE_TABLET | Freq: Once | ORAL | Status: AC
  Administered 2013-09-14: 15:00:00 40 meq via ORAL
  Filled 2013-09-14: qty 2

## 2013-09-14 MED ORDER — DOPAMINE-DEXTROSE 3.2-5 MG/ML-% IV SOLN
4.0000 ug/kg/min | INTRAVENOUS | Status: DC
  Administered 2013-09-14 – 2013-09-16 (×2): 4 ug/kg/min via INTRAVENOUS
  Filled 2013-09-14 (×2): qty 250

## 2013-09-14 NOTE — Progress Notes (Signed)
NURSING PROGRESS NOTE  Frank Mclean [id number] Admission Data: 09/14/2013 5:10 AM Attending Provider: Wendall Stade, MD FAO:ZHYQ Plotnikov, MD Code Status: full code  Frank Mclean is a 72 y.o. male patient admitted from ED:  -No acute distress noted.  -No complaints of shortness of breath.  -No complaints of chest pain.   Cardiac Monitoring: Box # 14 in place. Cardiac monitor yields:normal sinus rhythm with sinus arrhythmia.  Blood pressure 135/74, pulse 66, temperature 98.4 F (36.9 C), temperature source Oral, resp. rate 18, height 6' 2.5" (1.892 m), weight 93.5 kg (206 lb 2.1 oz), SpO2 100.00%.   IV Fluids:  IV in place, occlusive dsg intact without redness, IV cath hand right, condition patent and no redness normal saline.   Allergies:  Review of patient's allergies indicates no known allergies.  Past Medical History:   has a past medical history of Gout; HTN (hypertension); Hyperlipidemia; Osteoarthritis; History of CVA (cerebrovascular accident); GERD (gastroesophageal reflux disease); DJD (degenerative joint disease); BPH (benign prostatic hypertrophy); DM (diabetes mellitus), type 2; Peripheral vascular disease; Bilateral leg ulcer; CKD (chronic kidney disease) stage 3, GFR 30-59 ml/min; CAD (coronary artery disease); Chronic combined systolic and diastolic heart failure; LBBB (left bundle branch block); Atrial fibrillation; and NICM (nonischemic cardiomyopathy).  Past Surgical History:   has past surgical history that includes Cardiac catheterization (04-16-2011   DR [facility]); transthoracic echocardiogram (12-31-2010); and AORTOGRAM W/ BILATERAL LOWER EXTREMITIY RUNOFF (05-18-2013  DR FIELDS).  Social History:   reports that he quit smoking about 39 years ago. He has never used smokeless tobacco. He reports that he does not drink alcohol or use illicit drugs.  Skin:  With nonhealing wounds on bilat LE   Patient/Family orientated to room. Information packet given to  patient/family. Admission inpatient armband information verified with patient/family to include name and date of birth and placed on patient arm. Side rails up x 2, fall assessment and education completed with patient/family. Patient/family able to verbalize understanding of risk associated with falls and verbalized understanding to call for assistance before getting out of bed. Call light within reach. Patient/family able to voice and demonstrate understanding of unit orientation instructions.    Will continue to evaluate and treat per MD orders.

## 2013-09-14 NOTE — Progress Notes (Signed)
ANTICOAGULATION CONSULT NOTE - Follow Up Consult  Pharmacy Consult for Coumadin Indication: hx CVA. A.fib  No Known Allergies  Patient Measurements: Height: 6' 2.5" (189.2 cm) Weight: 206 lb 2.1 oz (93.5 kg) IBW/kg (Calculated) : 83.35 Heparin Dosing Weight:   Vital Signs: Temp: 97.4 F (36.3 C) (09/26 1118) Temp src: Oral (09/26 1118) BP: 139/71 mmHg (09/26 1118) Pulse Rate: 56 (09/26 1118)  Labs:  Recent Labs  09/13/13 1240 09/13/13 1908 09/13/13 2005 09/13/13 2043 09/14/13 0500  HGB 10.5* 9.7*  --   --  9.8*  HCT 33.2* 30.8*  --   --  31.5*  PLT 287.0 262  --   --  262  LABPROT  --   --   --  24.6* 25.2*  INR  --   --   --  2.31* 2.38*  CREATININE 3.3* 3.54*  --   --  3.37*  TROPONINI  --   --  <0.30  --   --     Estimated Creatinine Clearance: 22.7 ml/min (by C-G formula based on Cr of 3.37).   Medications:  Scheduled:  . amiodarone  200 mg Oral Daily  . aspirin EC  81 mg Oral Daily  . atorvastatin  20 mg Oral QHS  . cholecalciferol  1,000 Units Oral Daily  . collagenase  1 application Topical Custom  . doxycycline  100 mg Oral BID  . ferrous sulfate  325 mg Oral Daily  . glipiZIDE  2.5 mg Oral BID AC  . hydrALAZINE  50 mg Oral TID  . insulin aspart  0-15 Units Subcutaneous TID WC  . isosorbide mononitrate  60 mg Oral Daily  . labetalol  200 mg Oral BID  . potassium chloride SA  20 mEq Oral TID  . vitamin B-12  1,000 mcg Oral Daily  . warfarin  2.5 mg Oral Custom  . [START ON 09/15/2013] warfarin  5 mg Oral Custom  . Warfarin - Pharmacist Dosing Inpatient   Does not apply q1800    Assessment: Pt was sent to the ED from the MD office due to abnormal labs. Pt takes coumadin for hx CVA, A.Fib. His INR has stayed therapeutic so we are following his home regimen of coumadin.  Goal of Therapy:  INR 2-3    Plan:  INR is therapeutic at 2.38 today. Will continue pt's home dosing as long as the INR remains in the 2-3 range.  Eugene Garnet 09/14/2013,11:49 AM

## 2013-09-14 NOTE — Progress Notes (Signed)
Utilization review completed. Keirstan Iannello, RN, BSN. 

## 2013-09-14 NOTE — Consult Note (Signed)
WOC consult Note Reason for Consult: Bilateral LE ulcers. Tx regimen per the [facility] wound care center (Dr. Jimmey Ralph).  Pt has ulcerations of the achilles area bilaterally.  Wife at the bedside and patient reports that he has his wraps changed M/W/F between the [facility] and [facility].   Wound type: ulcerations of the bilateral achilles area, hx of Apligraft application to the left inner malleolar region. This area does appear to have granulation tissue and is not necrotic, appears hypergranulated.  The other portion of the left achilles are pink, but non granular with some yellow drainage over the wound bed, some minimal necrosis at the wound edges and noted advancement of skin island into the wound bed.  Ulceration of the right achilles is worse, with more foul drainage and non granular, minimal necrotic tissue present in either wound.  Wife reported the wounds were debrided at his last [facility] visit. Measurement:L- 7cmx 9cm x 0.5cm, L malleolar 3cm x 3cm x 0  R- 10cm x 11cm x 0.5cm  Wound bed:see above Drainage (amount, consistency, odor) both areas have malodorous drainage, that is yellow-brown. Moderate to heavy amounts right >left. Periwound: intact Dressing procedure/placement/frequency: will continue tx POC per the wound care center, wife seems fairly set on this.Santyl applied to the minimal amount of necrotic tissue present in each wound, covered by ABD pads, wrapped with kerlix and Coban with 50% overlay and minimal stretch since pt is not edematous. Change M/W/F, will request beside nursing staff to change moving forward.  Restart HHRN for dressings when pt discharged back to home and keep follow up appointment with [facility] at the time of DC.   Discussed POC with patient and bedside nurse.  Re consult if needed, will not follow at this time. Thanks  Liyla Radliff [employer], CWOCN [phone])

## 2013-09-14 NOTE — Telephone Encounter (Signed)
Patient admitted to the hospital last night. Tereso Newcomer, PA-C   09/14/2013 8:52 AM

## 2013-09-14 NOTE — Progress Notes (Signed)
SUBJECTIVE:  He is in good spirits and has no SOB.  No chest pain.     PHYSICAL EXAM Filed Vitals:   09/13/13 2309 09/14/13 0500 09/14/13 1118 09/14/13 1342  BP:  131/72 139/71 122/69  Pulse: 66 58 56 56  Temp:  98.2 F (36.8 C) 97.4 F (36.3 C) 98.5 F (36.9 C)  TempSrc:  Oral Oral Oral  Resp:  18 16 18   Height:      Weight:      SpO2:  97% 99% 99%   General:  No distress HEENT:  Dentures Lungs:  Clear Heart:  Irregular Abdomen:  Positive bowel sounds, no rebound no guarding Extremities:  Mild edema, legs wrapped.   Neuro:  nonfocal  LABS:  Results for orders placed during the hospital encounter of 09/13/13 (from the past 24 hour(s))  CBC WITH DIFFERENTIAL     Status: Abnormal   Collection Time    09/13/13  7:08 PM      Result Value Range   WBC 10.1  4.0 - 10.5 K/uL   RBC 3.96 (*) 4.22 - 5.81 MIL/uL   Hemoglobin 9.7 (*) 13.0 - 17.0 g/dL   HCT 98.1 (*) 19.1 - 47.8 %   MCV 77.8 (*) 78.0 - 100.0 fL   MCH 24.5 (*) 26.0 - 34.0 pg   MCHC 31.5  30.0 - 36.0 g/dL   RDW 29.5 (*) 62.1 - 30.8 %   Platelets 262  150 - 400 K/uL   Neutrophils Relative % 82 (*) 43 - 77 %   Neutro Abs 8.3 (*) 1.7 - 7.7 K/uL   Lymphocytes Relative 10 (*) 12 - 46 %   Lymphs Abs 1.0  0.7 - 4.0 K/uL   Monocytes Relative 7  3 - 12 %   Monocytes Absolute 0.7  0.1 - 1.0 K/uL   Eosinophils Relative 1  0 - 5 %   Eosinophils Absolute 0.1  0.0 - 0.7 K/uL   Basophils Relative 0  0 - 1 %   Basophils Absolute 0.0  0.0 - 0.1 K/uL  COMPREHENSIVE METABOLIC PANEL     Status: Abnormal   Collection Time    09/13/13  7:08 PM      Result Value Range   Sodium 143  135 - 145 mEq/L   Potassium 2.8 (*) 3.5 - 5.1 mEq/L   Chloride 97  96 - 112 mEq/L   CO2 31  19 - 32 mEq/L   Glucose, Bld 87  70 - 99 mg/dL   BUN 50 (*) 6 - 23 mg/dL   Creatinine, Ser 6.57 (*) 0.50 - 1.35 mg/dL   Calcium 8.9  8.4 - 84.6 mg/dL   Total Protein 6.7  6.0 - 8.3 g/dL   Albumin 3.5  3.5 - 5.2 g/dL   AST 15  0 - 37 U/L   ALT 15  0 -  53 U/L   Alkaline Phosphatase 90  39 - 117 U/L   Total Bilirubin 0.2 (*) 0.3 - 1.2 mg/dL   GFR calc non Af Amer 16 (*) >90 mL/min   GFR calc Af Amer 18 (*) >90 mL/min  POCT I-STAT TROPONIN I     Status: Abnormal   Collection Time    09/13/13  7:46 PM      Result Value Range   Troponin i, poc 0.12 (*) 0.00 - 0.08 ng/mL   Comment NOTIFIED PHYSICIAN     Comment 3           TROPONIN  I     Status: None   Collection Time    09/13/13  8:05 PM      Result Value Range   Troponin I <0.30  <0.30 ng/mL  MAGNESIUM     Status: None   Collection Time    09/13/13  8:21 PM      Result Value Range   Magnesium 1.8  1.5 - 2.5 mg/dL  PROTIME-INR     Status: Abnormal   Collection Time    09/13/13  8:43 PM      Result Value Range   Prothrombin Time 24.6 (*) 11.6 - 15.2 seconds   INR 2.31 (*) 0.00 - 1.49  GLUCOSE, CAPILLARY     Status: Abnormal   Collection Time    09/13/13 10:33 PM      Result Value Range   Glucose-Capillary 112 (*) 70 - 99 mg/dL  PROTIME-INR     Status: Abnormal   Collection Time    09/14/13  5:00 AM      Result Value Range   Prothrombin Time 25.2 (*) 11.6 - 15.2 seconds   INR 2.38 (*) 0.00 - 1.49  CBC     Status: Abnormal   Collection Time    09/14/13  5:00 AM      Result Value Range   WBC 11.9 (*) 4.0 - 10.5 K/uL   RBC 4.02 (*) 4.22 - 5.81 MIL/uL   Hemoglobin 9.8 (*) 13.0 - 17.0 g/dL   HCT 16.1 (*) 09.6 - 04.5 %   MCV 78.4  78.0 - 100.0 fL   MCH 24.4 (*) 26.0 - 34.0 pg   MCHC 31.1  30.0 - 36.0 g/dL   RDW 40.9 (*) 81.1 - 91.4 %   Platelets 262  150 - 400 K/uL  BASIC METABOLIC PANEL     Status: Abnormal   Collection Time    09/14/13  5:00 AM      Result Value Range   Sodium 145  135 - 145 mEq/L   Potassium 2.8 (*) 3.5 - 5.1 mEq/L   Chloride 101  96 - 112 mEq/L   CO2 28  19 - 32 mEq/L   Glucose, Bld 110 (*) 70 - 99 mg/dL   BUN 53 (*) 6 - 23 mg/dL   Creatinine, Ser 7.82 (*) 0.50 - 1.35 mg/dL   Calcium 9.2  8.4 - 95.6 mg/dL   GFR calc non Af Amer 17 (*) >90  mL/min   GFR calc Af Amer 19 (*) >90 mL/min  GLUCOSE, CAPILLARY     Status: Abnormal   Collection Time    09/14/13  7:56 AM      Result Value Range   Glucose-Capillary 103 (*) 70 - 99 mg/dL   Comment 1 Documented in Chart     Comment 2 Notify RN    GLUCOSE, CAPILLARY     Status: None   Collection Time    09/14/13 11:30 AM      Result Value Range   Glucose-Capillary 84  70 - 99 mg/dL   Comment 1 Documented in Chart     Comment 2 Notify RN      Intake/Output Summary (Last 24 hours) at 09/14/13 1344 Last data filed at 09/14/13 1300  Gross per 24 hour  Intake   1035 ml  Output    400 ml  Net    635 ml     ASSESSMENT AND PLAN:  CKD:  Creatinine continues to be elevated.  Probably a combination I suspect that  this is a warm and dry situation.  I am continuing gentle hydration and holding diuretic.  I will start a low dose of dopamine.  Renal consult in the AM if creat continues to rise.    ACUTE ON CHRONIC SYSTOLIC AND DIASTOLIC HF:   As above.   ATRIAL FIB:  INR therapeutic.      HTN:  This is being managed in the context of treating his CHF  HYPOKALEMIA:  I will give an additional 40 meq today.   DIABETES:  Continue current therapy.    Rollene Rotunda 09/14/2013 1:44 PM

## 2013-09-15 DIAGNOSIS — N179 Acute kidney failure, unspecified: Secondary | ICD-10-CM | POA: Diagnosis not present

## 2013-09-15 DIAGNOSIS — I509 Heart failure, unspecified: Secondary | ICD-10-CM | POA: Diagnosis not present

## 2013-09-15 DIAGNOSIS — I5043 Acute on chronic combined systolic (congestive) and diastolic (congestive) heart failure: Secondary | ICD-10-CM | POA: Diagnosis not present

## 2013-09-15 LAB — BASIC METABOLIC PANEL
BUN: 35 mg/dL — ABNORMAL HIGH (ref 6–23)
Calcium: 9.4 mg/dL (ref 8.4–10.5)
Chloride: 103 mEq/L (ref 96–112)
Creatinine, Ser: 2.15 mg/dL — ABNORMAL HIGH (ref 0.50–1.35)
GFR calc Af Amer: 33 mL/min — ABNORMAL LOW (ref >90)
GFR calc non Af Amer: 29 mL/min — ABNORMAL LOW (ref >90)
Potassium: 2.9 mEq/L — ABNORMAL LOW (ref 3.5–5.1)

## 2013-09-15 LAB — PROTIME-INR
INR: 2.14 — ABNORMAL HIGH (ref 0.00–1.49)
Prothrombin Time: 23.2 seconds — ABNORMAL HIGH (ref 11.6–15.2)

## 2013-09-15 LAB — GLUCOSE, CAPILLARY
Glucose-Capillary: 101 mg/dL — ABNORMAL HIGH (ref 70–99)
Glucose-Capillary: 126 mg/dL — ABNORMAL HIGH (ref 70–99)
Glucose-Capillary: 91 mg/dL (ref 70–99)

## 2013-09-15 NOTE — Progress Notes (Addendum)
ANTICOAGULATION CONSULT NOTE - Follow Up Consult  Pharmacy Consult for Coumadin Indication: atrial fibrillation  No Known Allergies  Patient Measurements: Height: 6' 2.5" (189.2 cm) Weight: 210 lb (95.255 kg) IBW/kg (Calculated) : 83.35 Heparin Dosing Weight:   Vital Signs: Temp: 97.9 F (36.6 C) (09/27 0458) Temp src: Oral (09/27 0458) BP: 171/82 mmHg (09/27 1109) Pulse Rate: 62 (09/27 1109)  Labs:  Recent Labs  09/13/13 1240 09/13/13 1908 09/13/13 2005 09/13/13 2043 09/14/13 0500 09/15/13 0555  HGB 10.5* 9.7*  --   --  9.8*  --   HCT 33.2* 30.8*  --   --  31.5*  --   PLT 287.0 262  --   --  262  --   LABPROT  --   --   --  24.6* 25.2* 23.2*  INR  --   --   --  2.31* 2.38* 2.14*  CREATININE 3.3* 3.54*  --   --  3.37*  --   TROPONINI  --   --  <0.30  --   --   --     Estimated Creatinine Clearance: 22.7 ml/min (by C-G formula based on Cr of 3.37).   Medications:  Scheduled:  . amiodarone  200 mg Oral Daily  . aspirin EC  81 mg Oral Daily  . atorvastatin  20 mg Oral QHS  . cholecalciferol  1,000 Units Oral Daily  . collagenase  1 application Topical Custom  . doxycycline  100 mg Oral BID  . ferrous sulfate  325 mg Oral Daily  . glipiZIDE  2.5 mg Oral BID AC  . hydrALAZINE  50 mg Oral TID  . insulin aspart  0-15 Units Subcutaneous TID WC  . isosorbide mononitrate  60 mg Oral Daily  . labetalol  200 mg Oral BID  . potassium chloride SA  20 mEq Oral TID  . vitamin B-12  1,000 mcg Oral Daily  . warfarin  2.5 mg Oral Custom  . warfarin  5 mg Oral Custom  . Warfarin - Pharmacist Dosing Inpatient   Does not apply q1800    Assessment: 72yo male with AFib and hx of stroke, continues on home Coumadin regimen.  INR is down this AM to 2.14.  No bleeding problems noted.    Goal of Therapy:  INR 2-3 Monitor platelets by anticoagulation protocol: Yes   Plan:  1.  Coumadin 5mg  today as per home regimen 2.  F/U in AM  Marisue Humble, PharmD Clinical  Pharmacist [facility]

## 2013-09-15 NOTE — Progress Notes (Signed)
   SUBJECTIVE:  No SOB.  No chest pain.     PHYSICAL EXAM Filed Vitals:   09/14/13 1700 09/14/13 2159 09/15/13 0458 09/15/13 1109  BP:  137/57 146/70 171/82  Pulse:  69 67 62  Temp:  98.5 F (36.9 C) 97.9 F (36.6 C)   TempSrc:  Oral Oral   Resp:  18 18   Height:      Weight: 209 lb 11.2 oz (95.119 kg)  210 lb (95.255 kg)   SpO2:  99% 95%    General:  No distress Lungs:  Clear Heart:  Irregular Abdomen:  Positive bowel sounds, no rebound no guarding Extremities:  Mild edema, legs wrapped.     LABS:  Results for orders placed during the hospital encounter of 09/13/13 (from the past 24 hour(s))  GLUCOSE, CAPILLARY     Status: Abnormal   Collection Time    09/14/13  5:01 PM      Result Value Range   Glucose-Capillary 120 (*) 70 - 99 mg/dL   Comment 1 Documented in Chart     Comment 2 Notify RN    GLUCOSE, CAPILLARY     Status: Abnormal   Collection Time    09/14/13  9:56 PM      Result Value Range   Glucose-Capillary 130 (*) 70 - 99 mg/dL   Comment 1 Notify RN    PROTIME-INR     Status: Abnormal   Collection Time    09/15/13  5:55 AM      Result Value Range   Prothrombin Time 23.2 (*) 11.6 - 15.2 seconds   INR 2.14 (*) 0.00 - 1.49  GLUCOSE, CAPILLARY     Status: Abnormal   Collection Time    09/15/13  7:29 AM      Result Value Range   Glucose-Capillary 101 (*) 70 - 99 mg/dL  GLUCOSE, CAPILLARY     Status: Abnormal   Collection Time    09/15/13 11:33 AM      Result Value Range   Glucose-Capillary 126 (*) 70 - 99 mg/dL   Comment 1 Documented in Chart     Comment 2 Notify RN      Intake/Output Summary (Last 24 hours) at 09/15/13 1157 Last data filed at 09/15/13 0826  Gross per 24 hour  Intake    300 ml  Output   1000 ml  Net   -700 ml     ASSESSMENT AND PLAN:  CKD:  Creatinine pending.  Started on dopamine yesterday.  I am not sure that the I/O are complete. .  Probably a combination I suspect that this is a warm and dry situation.  I am continuing  gentle hydration and holding diuretic.  ACUTE ON CHRONIC SYSTOLIC AND DIASTOLIC HF:   As above.   ATRIAL FIB:  INR therapeutic.      HTN:  This is being managed in the context of treating his CHF  HYPOKALEMIA: Labs pending.   DIABETES:  Continue current therapy.    Fayrene Fearing [facility] 09/15/2013 11:57 AM

## 2013-09-15 NOTE — Progress Notes (Signed)
09/15/2013 patient potassium was 2.9 today call MD on call  at 1835, no orders given patient is on schedule potassium. Continue to monitor [facility] RN.

## 2013-09-15 NOTE — Progress Notes (Signed)
UR Completed.  Tniya Bowditch Jane [phone] 09/15/2013  

## 2013-09-16 DIAGNOSIS — E876 Hypokalemia: Secondary | ICD-10-CM | POA: Diagnosis not present

## 2013-09-16 DIAGNOSIS — N179 Acute kidney failure, unspecified: Secondary | ICD-10-CM | POA: Diagnosis not present

## 2013-09-16 DIAGNOSIS — I5043 Acute on chronic combined systolic (congestive) and diastolic (congestive) heart failure: Secondary | ICD-10-CM | POA: Diagnosis not present

## 2013-09-16 DIAGNOSIS — I509 Heart failure, unspecified: Secondary | ICD-10-CM | POA: Diagnosis not present

## 2013-09-16 LAB — GLUCOSE, CAPILLARY
Glucose-Capillary: 111 mg/dL — ABNORMAL HIGH (ref 70–99)
Glucose-Capillary: 101 mg/dL — ABNORMAL HIGH (ref 70–99)
Glucose-Capillary: 80 mg/dL (ref 70–99)

## 2013-09-16 LAB — BASIC METABOLIC PANEL
BUN: 28 mg/dL — ABNORMAL HIGH (ref 6–23)
CO2: 30 mEq/L (ref 19–32)
Calcium: 9.7 mg/dL (ref 8.4–10.5)
Chloride: 105 mEq/L (ref 96–112)
Creatinine, Ser: 1.93 mg/dL — ABNORMAL HIGH (ref 0.50–1.35)
GFR calc Af Amer: 38 mL/min — ABNORMAL LOW (ref >90)
GFR calc non Af Amer: 33 mL/min — ABNORMAL LOW (ref >90)
Potassium: 3 mEq/L — ABNORMAL LOW (ref 3.5–5.1)
Sodium: 147 mEq/L — ABNORMAL HIGH (ref 135–145)

## 2013-09-16 LAB — PROTIME-INR
INR: 2.2 — ABNORMAL HIGH (ref 0.00–1.49)
Prothrombin Time: 23.7 seconds — ABNORMAL HIGH (ref 11.6–15.2)

## 2013-09-16 MED ORDER — POTASSIUM CHLORIDE CRYS ER 20 MEQ PO TBCR
40.0000 meq | EXTENDED_RELEASE_TABLET | Freq: Once | ORAL | Status: AC
  Administered 2013-09-16: 40 meq via ORAL

## 2013-09-16 NOTE — Progress Notes (Addendum)
Clinical Social Work Department CLINICAL SOCIAL WORK PLACEMENT NOTE 09/16/2013  Patient:  Frank Mclean, Frank Mclean  Account Number:  [id number] Admit date:  09/13/2013  Clinical Social Worker:  Jacelyn Grip  Date/time:  09/16/2013 02:20 PM  Clinical Social Work is seeking post-discharge placement for this patient at the following level of care:   SKILLED NURSING   (*CSW will update this form in Epic as items are completed)   09/16/2013  Patient/family provided with [facility] Department of Clinical Social Work's list of facilities offering this level of care within the geographic area requested by the patient (or if unable, by the patient's family).  09/16/2013  Patient/family informed of their freedom to choose among providers that offer the needed level of care, that participate in Medicare, Medicaid or managed care program needed by the patient, have an available bed and are willing to accept the patient.  09/16/2013  Patient/family informed of [facility]' ownership interest in [facility], as well as of the fact that they are under no obligation to receive care at this facility.  PASARR submitted to EDS on 09/16/2013 PASARR number received from EDS on 09/16/2013  FL2 transmitted to all facilities in geographic area requested by pt/family on  09/16/2013 FL2 transmitted to all facilities within larger geographic area on   Patient informed that his/her managed care company has contracts with or will negotiate with  certain facilities, including the following:     Patient/family informed of bed offers received:  09/17/2013 Patient chooses bed at [facility] Physician recommends and patient chooses bed at    Patient to be transferred [name] 09/18/2013   Patient to be transferred to facility by [facility]  The following physician request were entered in Epic:   Additional Comments: excluded [facility] from SNF search at pt request    Jacklynn [name], MSW, [facility]   Clinical Social Work Weekend coverage [phone]

## 2013-09-16 NOTE — Progress Notes (Signed)
Clinical Social Work Department BRIEF PSYCHOSOCIAL ASSESSMENT 09/16/2013  Patient:  Frank Mclean, Frank Mclean     Account Number:  [id number]     Admit date:  09/13/2013  Clinical Social Worker:  Jacelyn Grip  Date/Time:  09/16/2013 01:45 PM  Referred by:  RN  Date Referred:  09/16/2013 Referred for  SNF Placement   Other Referral:   Interview type:  Patient Other interview type:   and patient family at bedside    PSYCHOSOCIAL DATA Living Status:  WIFE Admitted from facility:   Level of care:   Primary support name:  Fredonia Deguzman/wife/[phone] Primary support relationship to patient:  SPOUSE Degree of support available:   strong    CURRENT CONCERNS Current Concerns  Post-Acute Placement   Other Concerns:    SOCIAL WORK ASSESSMENT / PLAN CSW received referral for New SNF.    CSW met with pt and pt family at bedside. CSW introduced self and explained role. CSW discussed recommendation for SNF and pt and pt wife agreeable. Pt and pt wife do not want information sent to [facility], but agreeable to initiation of SNF search to the rest of [facility] facilities. Pt wife wants to ensure that pt wounds are managed at SNF.    CSW completed FL2 and initiated SNF search to [facility] excluding [facility]. CSW noted that pt met inpatient criteria on 09/15/2013 and pt would require a 3-night inpatient stay for Medicare to cover SNF placement. CSW notified unit RN and unit charge RN of this in order to address with MD. CSW to notify weekday CSW as well.    Weekday CSW to follow up with pt and pt family re: bed offers.    Weekday CSW to continue to follow and facilitate pt discharge needs when pt medically ready for discharge.   Assessment/plan status:  Psychosocial Support/Ongoing Assessment of Needs Other assessment/ plan:   discharge planning   Information/referral to community resources:   [facility] list    PATIENT'S/FAMILY'S RESPONSE TO PLAN OF  CARE: Pt alert and oriented x 4. Pt has been to [facility] in the past, but there were a few things about [facility] that pt was not satisfied with and requested that [facility] be left off of list. Pt has supportive family.    Jacklynn [name], MSW, [employer]  Clinical Social Work Weekend coverage [phone]

## 2013-09-16 NOTE — Progress Notes (Signed)
Patient's wife notes he is too weak for her to take care of at home. PT asked to see patient and they recommend SNF. He is to come off Dopamine today. Will see how he does off Dopamine and diuretics. Replete K+. Check BMET in AM Probable d/c to SNF in next 1-2 days. Tereso Newcomer, PA-C   09/16/2013 2:17 PM

## 2013-09-16 NOTE — Progress Notes (Addendum)
Tereso Newcomer, PA notified of patient's SNF needs from recommendation of PT.  Social worker to facilitate SNF placement.  Will continue to monitor. Wiley, Mitzi Hansen

## 2013-09-16 NOTE — Discharge Summary (Signed)
Discharge Summary   Patient ID: Frank Mclean, MRN: [id number], DOB/AGE: 08/12/1941 72 y.o.  Admit date: 09/13/2013 Discharge date: 09/18/2013   Primary Care Physician:  [name]   Primary Cardiologist:  Dr. Rollene Rotunda   Reason for Admission:  Acute on Chronic Renal Failure with Profound Hypokalemia   Primary Discharge Diagnoses:  1. Acute on Chronic Renal Failure 2. Chronic Combined Systolic and Diastolic CHF/NICM **d/c weight of 207 lbs. 3. Hypokalemia 4. Deconditioning  5. Bilateral Lower Extremity Ulcers 6. Paroxysmal Atrial Fibrillation **on chronic coumadin and amiodarone therapy. 7. Diabetes Mellitus 8. Left Hand Weakness - Head CT unremarkable this admission 9. Nonobstructive CAD 10. Hypertension   Wt Readings from Last 3 Encounters:  09/18/13 207 lb 14.3 oz (94.3 kg)  09/13/13 206 lb (93.441 kg)  09/06/13 208 lb (94.348 kg)   Secondary Discharge Diagnoses:   Past Medical History  Diagnosis Date  . Gout   . HTN (hypertension)   . Hyperlipidemia   . Osteoarthritis   . History of CVA (cerebrovascular accident)     Left pontine infarct July 2004; changed from Plavix to Coumadin in 2004 per MD at [facility]  . GERD (gastroesophageal reflux disease)   . DJD (degenerative joint disease)   . BPH (benign prostatic hypertrophy)   . DM (diabetes mellitus), type 2   . Peripheral vascular disease   . Bilateral leg ulcer     ACHILLES AREA--  NONHEALING  . CKD (chronic kidney disease) stage 3, GFR 30-59 ml/min   . CAD (coronary artery disease)     a. LHC 4/12: Mid LAD 25%, mid diagonal 30%, AV circumflex 40%, proximal OM 25%, distal RCA 40%  . Chronic combined systolic and diastolic heart failure     a. Echo 05/2013: EF 25-30%, diffuse HK, restrictive physiology, trivial AI, mild MR, moderate LAE, reduced RV systolic function, PASP 42  . LBBB (left bundle branch block)   . Atrial fibrillation     a. amiodarone Rx started 05/2013;  b. chronic coumadin  . NICM  (nonischemic cardiomyopathy)     a. EF 25-30%.    Allergies:   No Known Allergies   Procedures Performed This Admission:    None  History of Present Illness:   Frank Mclean is a 72 y.o. male with a hx of chronic combined systolic and diastolic CHF, NICM, nonobstructive CAD, AFib, CAD, HTN, HL, PAD. LHC 4/12: mLAD 25%, mDx 30%, AVCFX 40%, pOM 25%, dRCA 40%. Echocardiogram 05/2013: EF 25-30%, diffuse HK, restrictive physiology, trivial AI, mild MR, mod LAE, reduced RVSF, PASP 42. Last admission 05/2013 for atrial fibrillation with RVR complicated by type II non-STEMI and a/c combined systolic and diastolic CHF. He was placed on amiodarone at that time with restoration of NSR. Last seen by Dr. Antoine Poche 08/24/13. He was maintaining sinus rhythm. Amiodarone dose was reduced.  He was seen in the office recently with worsening LE edema.  He has been going to the wound center due to bilateral LE ulcers. He has been wearing UNNA boots.  He also noted increased dyspnea with exertion and was sleeping on 3 pillows (+1) recently. Metolazone was given x 1. CXR demonstrated no edema. His BNP was not significantly elevated.  He returned to the office in follow up and was fairly weak and seemed to be getting weaker.  He complained of DOE with minimal activities (NYHA Class III).  He also complained of L hand weakness. No visual loss or speech difficulty.  We arranged a Head  CT to rule out a bleed and checked follow up labs.  He was profoundly hypokalemic (K+ 2.5) and his renal function was worse (creatinine 2.6=>3.3).  He was directed to come to the ED for admission for further evaluation and management.    Hospital Course:  Following admission, diuretics were held and he was gently hydrated.  His K+ was supplemented.  He was seen by wound care to help with his bilateral LE ulcers while in the hospital.  He was hydrated gently as he was thought to mainly be dehydrated ("warm and dry").  Low dose dopamine was also  started.  He was monitored over the next 48 hours.  His creatinine continued to improve.  On 09/16/13, it was decided to stop Dopamine.  He would likely be sent home off diuretics with close follow up.  Patient is significantly weak and deconditioned.  His wife is unable to care for him at home and PT recommended SNF placement as discharge.  His K+ continued to be replaced.  He was monitored off diuretics and Dopamine.  His renal function was followed and creatinine did rise again to a peak of 2.26 but came back down to 1.96 this AM.  He was felt to be clinically stable for discharge to [facility].  Diuretic therapy remains on hold at the time of discharge.  We have arranged for a f/u appt on 10/8, at which time we will f/u his renal function and potassium.  Discharge Vitals:   Blood pressure 133/76, pulse 66, temperature 98.8 F (37.1 C), temperature source Oral, resp. rate 18, height 6' 2.5" (1.892 m), weight 207 lb 14.3 oz (94.3 kg), SpO2 94.00%.  Labs:  Lab Results  Component Value Date   WBC 11.9* 09/14/2013   HGB 9.8* 09/14/2013   HCT 31.5* 09/14/2013   MCV 78.4 09/14/2013   PLT 262 09/14/2013    Recent Labs  09/16/13 0630 09/17/13 0445 09/18/13 1152  NA 147* 148* 146*  K 3.0* 3.3* 4.1  CL 105 108 109  CO2 30 29 29   BUN 28* 30* 25*  CREATININE 1.93* 2.26* 1.96*  CALCIUM 9.7 9.5 9.5   Albumin:  3.5   Recent Labs  09/16/13 0630 09/17/13 0445  INR 2.20* 2.52*   Diagnostic Procedures and Studies:  Ct Head Wo Contrast  09/13/2013   IMPRESSION: No acute intracranial finding or interval change.   Electronically Signed   By: Christiana Pellant M.D.   On: 09/13/2013 21:29   Disposition:   Pt is being discharged to SNF today in good condition.  Follow-up Plans & Appointments      Follow-up Information   Follow up with Rollene Rotunda, MD On 11/22/2013. (8:45 AM)    Specialty:  Cardiology   Contact information:   [address] [place] [address] [phone]       Follow up with Tereso Newcomer, PA-C On 09/26/2013. (3:20 PM)    Specialty:  Physician Assistant   Contact information:   [address] [place] [address] [phone]      Discharge Medications    Medication List    STOP taking these medications       amLODipine 10 MG tablet  Commonly known as:  NORVASC     torsemide 100 MG tablet  Commonly known as:  DEMADEX      TAKE these medications       amiodarone 200 MG tablet  Commonly known as:  PACERONE  Take 1 tablet (200 mg total) by mouth daily.     atorvastatin 20 MG tablet  Commonly known as:  LIPITOR  Take 20 mg by mouth at bedtime.     budesonide-formoterol 160-4.5 MCG/ACT inhaler  Commonly known as:  SYMBICORT  Inhale 2 puffs into the lungs daily as needed (shortness of breath).     cholecalciferol 1000 UNITS tablet  Commonly known as:  VITAMIN D  Take 1,000 Units by mouth daily.     collagenase ointment  Commonly known as:  SANTYL  Apply 1 application topically 2 (two) times a week. On Monday and Friday at home (for wounds on legs) - applied Wednesday at the wound center     cyanocobalamin 1000 MCG tablet  Take 1 tablet (1,000 mcg total) by mouth daily.     doxycycline 100 MG capsule  Commonly known as:  VIBRAMYCIN  Take 100 mg by mouth 2 (two) times daily. Continuous course for wound care     ferrous sulfate 325 (65 FE) MG tablet  Take 1 tablet (325 mg total) by mouth daily.     glipiZIDE 5 MG tablet  Commonly known as:  GLUCOTROL  Take 0.5 tablets (2.5 mg total) by mouth 2 (two) times daily before a meal.     hydrALAZINE 50 MG tablet  Commonly known as:  APRESOLINE  Take 1 tablet (50 mg total) by mouth 3 (three) times daily.     HYDROcodone-acetaminophen 5-325 MG per tablet  Commonly known as:  NORCO/VICODIN  Take 1 tablet by mouth 2 (two) times daily as needed for pain.     isosorbide mononitrate 60 MG 24 hr tablet  Commonly  known as:  IMDUR  Take 1 tablet (60 mg total) by mouth daily.     labetalol 200 MG tablet  Commonly known as:  NORMODYNE  Take 1 tablet (200 mg total) by mouth 2 (two) times daily.     nitroGLYCERIN 0.4 MG SL tablet  Commonly known as:  NITROSTAT  Place 1 tablet (0.4 mg total) under the tongue every 5 (five) minutes as needed for chest pain.     potassium chloride SA 20 MEQ tablet  Commonly known as:  K-DUR,KLOR-CON  Take 20 mEq by mouth 3 (three) times daily.     triamcinolone cream 0.5 %  Commonly known as:  KENALOG  Apply 1 application topically 2 (two) times daily as needed (for skin breakdown).     warfarin 5 MG tablet  Commonly known as:  COUMADIN  Take 2.5-5 mg by mouth every evening. Take 1/2 tablet (2.5 mg) on Monday and Friday, take 1 tablet (5 mg) on all other days       Outstanding Labs/Studies  1. BMET @ f/u appt on 10/8.  Duration of Discharge Encounter: Greater than 30 minutes including physician and PA time.  Signed, Nicolasa Ducking, NP  09/18/2013 2:26 PM    [address] [place], [place] [address] [phone]   Patient seen and examined.  Plan as discussed in my rounding note for today and outlined above. Rollene Rotunda  09/18/2013  5:29 PM

## 2013-09-16 NOTE — Evaluation (Signed)
Physical Therapy Evaluation Patient Details Name: Frank Mclean MRN: [id number] DOB: 1941/05/12 Today's Date: 09/16/2013 Time: 4098-1191 PT Time Calculation (min): 26 min  PT Assessment / Plan / Recommendation History of Present Illness  72 year old male with history of nonischemic cardiomyopathy and systolic heart failure was admitted with acute on chronic renal failure, hypokalemia, and progressive fatigue.  Patient also with wounds on bil LE's - goes to Wound Center.  Clinical Impression  Patient presents with problems listed below.  Will benefit from acute PT to maximize independence prior to discharge.  Patient currently requires mod to max assist for mobility - wife unable to provide this level of care.  Recommend ST-SNF at discharge for continued therapy prior to return home.    PT Assessment  Patient needs continued PT services    Follow Up Recommendations  SNF    Does the patient have the potential to tolerate intense rehabilitation      Barriers to Discharge Decreased caregiver support Wife unable to provide level of care patient requires for mobility    Equipment Recommendations  None recommended by PT    Recommendations for Other Services     Frequency Min 3X/week    Precautions / Restrictions Precautions Precautions: Fall Precaution Comments: Patient had 2 falls last week, and 5 falls within the last 6 months. Required Braces or Orthoses: Other Brace/Splint Other Brace/Splint: Patient wears bil. Post-op boots during gait. Restrictions Weight Bearing Restrictions: No   Pertinent Vitals/Pain       Mobility  Bed Mobility Bed Mobility: Supine to Sit;Sit to Supine Supine to Sit: 4: Min assist;HOB elevated;With rails Sit to Supine: 3: Mod assist;With rail;HOB elevated Details for Bed Mobility Assistance: Verbal cues for technique.  Assist to raise trunk to sitting position.  Good sitting balance - patient fatigues quickly.  Assist to lower trunk and to bring LE's  onto bed to return to supine.  Patient unable to don/doff post-op shoes - wife did for him. Transfers Transfers: Sit to Stand;Stand to Sit Sit to Stand: 3: Mod assist;With upper extremity assist;From bed Stand to Sit: 3: Mod assist;With upper extremity assist;To bed Details for Transfer Assistance: Verbal cues for hand placement.  Patient has been pulling up on RW at home, with wife providing support to RW.  Encouraged patient to push up from surface.  Required mod assist to rise to standing.  Initially, patient with posterior lean, requiring assist for balance.  After beginning to ambulate, balance improved.  On sitting, patient unable to control descent to bed and "plopped" onto bed.  Reviewed need to control descent to chair for safety. Ambulation/Gait Ambulation/Gait Assistance: 4: Min assist Ambulation Distance (Feet): 20 Feet Assistive device: Rolling walker Ambulation/Gait Assistance Details: Verbal cues for safe use of RW.  Patient with very flexed posture with head down.  Cues to stand upright and look forward during gait. Patient fatigued quickly. Gait Pattern: Step-through pattern;Decreased step length - right;Decreased step length - left;Decreased stride length;Shuffle;Trunk flexed Gait velocity: Slow gait speed Stairs: No (Unable to attempt today)    Exercises     PT Diagnosis: Difficulty walking;Generalized weakness  PT Problem List: Decreased strength;Decreased activity tolerance;Decreased balance;Decreased mobility;Decreased knowledge of use of DME;Cardiopulmonary status limiting activity PT Treatment Interventions: DME instruction;Gait training;Functional mobility training;Patient/family education     PT Goals(Current goals can be found in the care plan section) Acute Rehab PT Goals Patient Stated Goal: To get stronger PT Goal Formulation: With patient/family Time For Goal Achievement: 09/23/13 Potential to Achieve Goals: Good  Visit Information  Last PT Received On:  09/16/13 Assistance Needed: +1 History of Present Illness: 72 year old male with history of nonischemic cardiomyopathy and systolic heart failure was admitted with acute on chronic renal failure, hypokalemia, and progressive fatigue.  Patient also with wounds on bil LE's - goes to Wound Center.       Prior Functioning  Home Living Family/patient expects to be discharged to:: Skilled nursing facility Living Arrangements: Spouse/significant other Home Equipment: Walker - 2 wheels;Cane - single point;Shower seat Additional Comments: Wife unable to safely care for patient due to decreased mobility. Prior Function Level of Independence: Independent with assistive device(s);Needs assistance Gait / Transfers Assistance Needed: Needed assist for stairs (patient has 6 steps to enter house. ADL's / Homemaking Assistance Needed: Assist with meal prep and housekeeping.  One month ago, patient was cooking meals. Communication Communication: No difficulties    Cognition  Cognition Arousal/Alertness: Awake/alert Behavior During Therapy: WFL for tasks assessed/performed Overall Cognitive Status: Within Functional Limits for tasks assessed    Extremity/Trunk Assessment Upper Extremity Assessment Upper Extremity Assessment: Generalized weakness (Difficulty using Lt hand) Lower Extremity Assessment Lower Extremity Assessment: Generalized weakness (Patient with wounds on Bil LE's) RLE Coordination: decreased gross motor LLE Coordination: decreased gross motor Cervical / Trunk Assessment Cervical / Trunk Assessment: Normal   Balance Balance Balance Assessed: Yes Static Sitting Balance Static Sitting - Balance Support: Right upper extremity supported;Feet supported Static Sitting - Level of Assistance: 5: Stand by assistance Static Sitting - Comment/# of Minutes: 8 Static Standing Balance Static Standing - Balance Support: Bilateral upper extremity supported Static Standing - Level of Assistance:  4: Min assist Static Standing - Comment/# of Minutes: 2 minutes with posterior lean initially.  End of Session PT - End of Session Equipment Utilized During Treatment: Gait belt Activity Tolerance: Patient limited by fatigue Patient left: in bed;with call bell/phone within reach;with family/visitor present Nurse Communication: Mobility status (Need for continued therapy prior to discharge home)  GP     Vena Austria 09/16/2013, 1:22 PM  Durenda Hurt. Renaldo Fiddler, [facility] Acute Rehab Services Pager [phone]

## 2013-09-16 NOTE — Progress Notes (Signed)
SUBJECTIVE:  No SOB.  No chest pain.  Much more alert than he was on admission.    PHYSICAL EXAM Filed Vitals:   09/15/13 2100 09/15/13 2130 09/16/13 0500 09/16/13 0947  BP: 140/75 122/82 157/86 126/72  Pulse: 69 62 67 67  Temp: 98.9 F (37.2 C)  99 F (37.2 C)   TempSrc:      Resp: 18  18   Height:      Weight:   202 lb 4.8 oz (91.763 kg)   SpO2: 99%  99%    General:  No distress Lungs:  Clear Heart:  Irregular Abdomen:  Positive bowel sounds, no rebound no guarding Extremities:  Mild edema, legs wrapped.     LABS:  Results for orders placed during the hospital encounter of 09/13/13 (from the past 24 hour(s))  GLUCOSE, CAPILLARY     Status: Abnormal   Collection Time    09/15/13 11:33 AM      Result Value Range   Glucose-Capillary 126 (*) 70 - 99 mg/dL   Comment 1 Documented in Chart     Comment 2 Notify RN    BASIC METABOLIC PANEL     Status: Abnormal   Collection Time    09/15/13 12:50 PM      Result Value Range   Sodium 145  135 - 145 mEq/L   Potassium 2.9 (*) 3.5 - 5.1 mEq/L   Chloride 103  96 - 112 mEq/L   CO2 31  19 - 32 mEq/L   Glucose, Bld 114 (*) 70 - 99 mg/dL   BUN 35 (*) 6 - 23 mg/dL   Creatinine, Ser 1.61 (*) 0.50 - 1.35 mg/dL   Calcium 9.4  8.4 - 09.6 mg/dL   GFR calc non Af Amer 29 (*) >90 mL/min   GFR calc Af Amer 33 (*) >90 mL/min  GLUCOSE, CAPILLARY     Status: None   Collection Time    09/15/13  4:10 PM      Result Value Range   Glucose-Capillary 94  70 - 99 mg/dL   Comment 1 Documented in Chart     Comment 2 Notify RN    GLUCOSE, CAPILLARY     Status: None   Collection Time    09/15/13  9:27 PM      Result Value Range   Glucose-Capillary 91  70 - 99 mg/dL  PROTIME-INR     Status: Abnormal   Collection Time    09/16/13  6:30 AM      Result Value Range   Prothrombin Time 23.7 (*) 11.6 - 15.2 seconds   INR 2.20 (*) 0.00 - 1.49  BASIC METABOLIC PANEL     Status: Abnormal   Collection Time    09/16/13  6:30 AM      Result Value  Range   Sodium 147 (*) 135 - 145 mEq/L   Potassium 3.0 (*) 3.5 - 5.1 mEq/L   Chloride 105  96 - 112 mEq/L   CO2 30  19 - 32 mEq/L   Glucose, Bld 116 (*) 70 - 99 mg/dL   BUN 28 (*) 6 - 23 mg/dL   Creatinine, Ser 0.45 (*) 0.50 - 1.35 mg/dL   Calcium 9.7  8.4 - 40.9 mg/dL   GFR calc non Af Amer 33 (*) >90 mL/min   GFR calc Af Amer 38 (*) >90 mL/min  GLUCOSE, CAPILLARY     Status: Abnormal   Collection Time    09/16/13  7:30 AM  Result Value Range   Glucose-Capillary 111 (*) 70 - 99 mg/dL   Comment 1 Documented in Chart     Comment 2 Notify RN      Intake/Output Summary (Last 24 hours) at 09/16/13 1050 Last data filed at 09/16/13 0800  Gross per 24 hour  Intake    120 ml  Output   1800 ml  Net  -1680 ml     ASSESSMENT AND PLAN:  CKD:  Creatinine pending.  On dopamine .  I am not sure that the I/O are complete.   I suspecedt that this is a warm and dry situation.  He has responded to holding diuretic and ACE.   Creat is much improved. Discontinue dopamine. I will send him home without diuretic for now but he will need a TOC visit and labs as described.     ACUTE ON CHRONIC SYSTOLIC AND DIASTOLIC HF:  I will supplement the potassium again today.    ATRIAL FIB:  INR therapeutic.      HTN:  This is being managed in the context of treating his CHF  HYPOKALEMIA:   Supplemented.  Repeat BMET Monday or Tuesday.   DIABETES:  Continue current therapy.    Fayrene Fearing [facility] 09/16/2013 10:50 AM

## 2013-09-16 NOTE — Progress Notes (Signed)
ANTICOAGULATION CONSULT NOTE - Follow Up Consult  Pharmacy Consult for Coumadin Indication: atrial fibrillation  No Known Allergies  Patient Measurements: Height: 6' 2.5" (189.2 cm) Weight: 202 lb 4.8 oz (91.763 kg) IBW/kg (Calculated) : 83.35 Heparin Dosing Weight:   Vital Signs: Temp: 99 F (37.2 C) (09/28 0500) BP: 126/72 mmHg (09/28 0947) Pulse Rate: 67 (09/28 0947)  Labs:  Recent Labs  09/13/13 1240 09/13/13 1908 09/13/13 2005  09/14/13 0500 09/15/13 0555 09/15/13 1250 09/16/13 0630  HGB 10.5* 9.7*  --   --  9.8*  --   --   --   HCT 33.2* 30.8*  --   --  31.5*  --   --   --   PLT 287.0 262  --   --  262  --   --   --   LABPROT  --   --   --   < > 25.2* 23.2*  --  23.7*  INR  --   --   --   < > 2.38* 2.14*  --  2.20*  CREATININE 3.3* 3.54*  --   --  3.37*  --  2.15* 1.93*  TROPONINI  --   --  <0.30  --   --   --   --   --   < > = values in this interval not displayed.  Estimated Creatinine Clearance: 39.6 ml/min (by C-G formula based on Cr of 1.93).   Medications:  Scheduled:  . amiodarone  200 mg Oral Daily  . aspirin EC  81 mg Oral Daily  . atorvastatin  20 mg Oral QHS  . cholecalciferol  1,000 Units Oral Daily  . collagenase  1 application Topical Custom  . doxycycline  100 mg Oral BID  . ferrous sulfate  325 mg Oral Daily  . glipiZIDE  2.5 mg Oral BID AC  . hydrALAZINE  50 mg Oral TID  . insulin aspart  0-15 Units Subcutaneous TID WC  . isosorbide mononitrate  60 mg Oral Daily  . labetalol  200 mg Oral BID  . potassium chloride SA  20 mEq Oral TID  . potassium chloride  40 mEq Oral Once  . vitamin B-12  1,000 mcg Oral Daily  . warfarin  2.5 mg Oral Custom  . warfarin  5 mg Oral Custom  . Warfarin - Pharmacist Dosing Inpatient   Does not apply q1800    Assessment: 72yo male with AFib on home dose of Coumadin.  INR therapeutic today at 2.2.  No bleeding problems noted.   Goal of Therapy:  INR 2-3 Monitor platelets by anticoagulation  protocol: Yes   Plan:  1.  Change INRs to MWF only 2.  Continue home dose  Marisue Humble, PharmD Clinical Pharmacist [facility]

## 2013-09-17 ENCOUNTER — Ambulatory Visit: Payer: Medicare Other | Admitting: Internal Medicine

## 2013-09-17 DIAGNOSIS — E876 Hypokalemia: Secondary | ICD-10-CM | POA: Diagnosis not present

## 2013-09-17 DIAGNOSIS — N179 Acute kidney failure, unspecified: Secondary | ICD-10-CM | POA: Diagnosis not present

## 2013-09-17 LAB — PROTIME-INR
INR: 2.52 — ABNORMAL HIGH (ref 0.00–1.49)
Prothrombin Time: 26.3 seconds — ABNORMAL HIGH (ref 11.6–15.2)

## 2013-09-17 LAB — GLUCOSE, CAPILLARY: Glucose-Capillary: 136 mg/dL — ABNORMAL HIGH (ref 70–99)

## 2013-09-17 LAB — BASIC METABOLIC PANEL
Calcium: 9.5 mg/dL (ref 8.4–10.5)
GFR calc Af Amer: 31 mL/min — ABNORMAL LOW (ref >90)
GFR calc non Af Amer: 27 mL/min — ABNORMAL LOW (ref >90)
Sodium: 148 mEq/L — ABNORMAL HIGH (ref 135–145)

## 2013-09-17 MED ORDER — POTASSIUM CHLORIDE CRYS ER 20 MEQ PO TBCR
20.0000 meq | EXTENDED_RELEASE_TABLET | Freq: Two times a day (BID) | ORAL | Status: DC
  Administered 2013-09-17 – 2013-09-18 (×3): 20 meq via ORAL
  Filled 2013-09-17 (×2): qty 1

## 2013-09-17 NOTE — Progress Notes (Signed)
ANTICOAGULATION CONSULT NOTE - Follow Up Consult  Pharmacy Consult for Coumadin Indication: atrial fibrillation  No Known Allergies  Patient Measurements: Height: 6' 2.5" (189.2 cm) Weight: 206 lb 4.8 oz (93.577 kg) IBW/kg (Calculated) : 83.35  Vital Signs: Temp: 98.5 F (36.9 C) (09/29 0508) Temp src: Oral (09/29 0508) BP: 154/85 mmHg (09/29 0508) Pulse Rate: 66 (09/29 0508)  Labs:  Recent Labs  09/15/13 0555 09/15/13 1250 09/16/13 0630 09/17/13 0445  LABPROT 23.2*  --  23.7* 26.3*  INR 2.14*  --  2.20* 2.52*  CREATININE  --  2.15* 1.93* 2.26*    Estimated Creatinine Clearance: 33.8 ml/min (by C-G formula based on Cr of 2.26).  Assessment: 72yom continues on coumadin for afib. INR is therapeutic on home regimen of 5mg  daily except 2.5mg  on Mon/Fri. No CBC. No bleeding reported.  Goal of Therapy:  INR 2-3 Monitor platelets by anticoagulation protocol: Yes   Plan:  1) Continue home regimen 2) INR MWF  Fredrik Rigger 09/17/2013,9:48 AM

## 2013-09-17 NOTE — Progress Notes (Signed)
CSW Proofreader) spoke with pt and pt wife and they would like to accept bed offer at [facility]. CSW confirmed bed availability with admissions at facility and they aware of plan for pt to dc tomorrow.  Albertina Leise, LCSWA [phone]

## 2013-09-17 NOTE — Progress Notes (Signed)
Physical Therapy Treatment Patient Details Name: Frank Mclean MRN: [id number] DOB: 1941-08-09 Today's Date: 09/17/2013 Time: 1205-1222 PT Time Calculation (min): 17 min  PT Assessment / Plan / Recommendation  History of Present Illness 72 year old male with history of nonischemic cardiomyopathy and systolic heart failure was admitted with acute on chronic renal failure, hypokalemia, and progressive fatigue.  Patient also with wounds on bil LE's - [facility].   PT Comments   Pt progressing very well today, ambulated 100' with RW and min A. PT will continue to follow.  Follow Up Recommendations  SNF     Does the patient have the potential to tolerate intense rehabilitation     Barriers to Discharge        Equipment Recommendations  None recommended by PT    Recommendations for Other Services    Frequency Min 2X/week   Progress towards PT Goals Progress towards PT goals: Progressing toward goals  Plan Current plan remains appropriate    Precautions / Restrictions Precautions Precautions: Fall Precaution Comments: Patient had 2 falls last week, and 5 falls within the last 6 months. Required Braces or Orthoses: Other Brace/Splint Other Brace/Splint: Patient wears bil. Post-op boots during gait. Restrictions Weight Bearing Restrictions: No   Pertinent Vitals/Pain No c/o pain    Mobility  Bed Mobility Bed Mobility: Not assessed Transfers Transfers: Sit to Stand;Stand to Sit Sit to Stand: 4: Min assist;From elevated surface;With upper extremity assist;From bed Stand to Sit: 4: Min assist;To chair/3-in-1;With upper extremity assist Details for Transfer Assistance: vc's for hand placement and for pt to slide feet back under him, especially since post-op shoes tend to slide. Able to stand with only min A to steady, from elevated surface. vc's for hand placement with sitting as well but pt did well controlling descent Ambulation/Gait Ambulation/Gait Assistance: 4: Min  assist Ambulation Distance (Feet): 100 Feet Assistive device: Rolling walker Ambulation/Gait Assistance Details: vc's for upright posture, especially as he began to fatigue, much better stamina today than last session and over weekend per family Gait Pattern: Decreased stride length;Step-through pattern;Trunk flexed;Wide base of support Gait velocity: Slow gait speed Stairs: No Wheelchair Mobility Wheelchair Mobility: No    Exercises General Exercises - Lower Extremity Long Arc Quad: AROM;Both;10 reps;Seated   PT Diagnosis:    PT Problem List:   PT Treatment Interventions:     PT Goals (current goals can now be found in the care plan section) Acute Rehab PT Goals Patient Stated Goal: To get stronger PT Goal Formulation: With patient/family Time For Goal Achievement: 09/23/13 Potential to Achieve Goals: Good  Visit Information  Last PT Received On: 09/17/13 Assistance Needed: +1 History of Present Illness: 72 year old male with history of nonischemic cardiomyopathy and systolic heart failure was admitted with acute on chronic renal failure, hypokalemia, and progressive fatigue.  Patient also with wounds on bil LE's - [facility].    Subjective Data  Subjective: pt feeling better today Patient Stated Goal: To get stronger   Cognition  Cognition Arousal/Alertness: Awake/alert Behavior During Therapy: WFL for tasks assessed/performed Overall Cognitive Status: Within Functional Limits for tasks assessed    Balance  Balance Balance Assessed: Yes Static Sitting Balance Static Sitting - Balance Support: No upper extremity supported;Feet supported Static Sitting - Level of Assistance: 6: Modified independent (Device/Increase time) Static Standing Balance Static Standing - Balance Support: Bilateral upper extremity supported;During functional activity Static Standing - Level of Assistance: 5: Stand by assistance Dynamic Standing Balance Dynamic Standing - Balance  Support: Bilateral upper extremity supported;During functional activity Dynamic Standing - Level of Assistance: 4: Min assist  End of Session PT - End of Session Equipment Utilized During Treatment: Gait belt Activity Tolerance: Patient tolerated treatment well Patient left: in chair;with call bell/phone within reach;with family/visitor present Nurse Communication: Mobility status   GP    Lyanne Co, PT  Acute Rehab Services  [phone]  Lyanne Co 09/17/2013, 1:37 PM

## 2013-09-17 NOTE — Care Management Note (Unsigned)
    Page 1 of 1   09/17/2013     10:45:50 AM   CARE MANAGEMENT NOTE 09/17/2013  Patient:  Frank Mclean, Frank Mclean   Account Number:  [id number]  Date Initiated:  09/17/2013  Documentation initiated by:  GRAVES-BIGELOW,Cristal Howatt  Subjective/Objective Assessment:   Pt admitted for acute on chronic renal failure, hypokalemia, and progressive fatigue.     Action/Plan:   CM will continue to monitor for disposition needs. PT is recommending SNF for plan.   Anticipated DC Date:  09/18/2013   Anticipated DC Plan:  SKILLED NURSING FACILITY  In-house referral  Clinical Social Worker      DC Planning Services  CM consult      Choice offered to / List presented to:             Status of service:  In process, will continue to follow Medicare Important Message given?   (If response is "NO", the following Medicare IM given date fields will be blank) Date Medicare IM given:   Date Additional Medicare IM given:    Discharge Disposition:    Per UR Regulation:  Reviewed for med. necessity/level of care/duration of stay  If discussed at Long Length of Stay Meetings, dates discussed:    Comments:

## 2013-09-17 NOTE — Progress Notes (Signed)
SUBJECTIVE:  No SOB.  No chest pain. Going to rehab and waiting for a room.    PHYSICAL EXAM Filed Vitals:   09/16/13 1418 09/16/13 1614 09/16/13 2136 09/17/13 0508  BP: 117/65 144/82 141/72 154/85  Pulse: 58 64 64 66  Temp: 98.1 F (36.7 C) 97.6 F (36.4 C) 99.2 F (37.3 C) 98.5 F (36.9 C)  TempSrc: Oral Oral Oral Oral  Resp: 16 18 18 18   Height:      Weight:    206 lb 4.8 oz (93.577 kg)  SpO2: 100% 100% 97% 99%   General:  No distress Lungs:  Clear Heart:  Irregular Abdomen:  Positive bowel sounds, no rebound no guarding Extremities:  Mild edema, legs wrapped.     LABS:  Results for orders placed during the hospital encounter of 09/13/13 (from the past 24 hour(s))  GLUCOSE, CAPILLARY     Status: None   Collection Time    09/16/13 11:46 AM      Result Value Range   Glucose-Capillary 96  70 - 99 mg/dL   Comment 1 Documented in Chart     Comment 2 Notify RN    GLUCOSE, CAPILLARY     Status: Abnormal   Collection Time    09/16/13  4:53 PM      Result Value Range   Glucose-Capillary 101 (*) 70 - 99 mg/dL   Comment 1 Documented in Chart     Comment 2 Notify RN    GLUCOSE, CAPILLARY     Status: None   Collection Time    09/16/13  8:59 PM      Result Value Range   Glucose-Capillary 80  70 - 99 mg/dL  PROTIME-INR     Status: Abnormal   Collection Time    09/17/13  4:45 AM      Result Value Range   Prothrombin Time 26.3 (*) 11.6 - 15.2 seconds   INR 2.52 (*) 0.00 - 1.49  BASIC METABOLIC PANEL     Status: Abnormal   Collection Time    09/17/13  4:45 AM      Result Value Range   Sodium 148 (*) 135 - 145 mEq/L   Potassium 3.3 (*) 3.5 - 5.1 mEq/L   Chloride 108  96 - 112 mEq/L   CO2 29  19 - 32 mEq/L   Glucose, Bld 85  70 - 99 mg/dL   BUN 30 (*) 6 - 23 mg/dL   Creatinine, Ser 4.09 (*) 0.50 - 1.35 mg/dL   Calcium 9.5  8.4 - 81.1 mg/dL   GFR calc non Af Amer 27 (*) >90 mL/min   GFR calc Af Amer 31 (*) >90 mL/min  GLUCOSE, CAPILLARY     Status: Abnormal   Collection Time    09/17/13  8:05 AM      Result Value Range   Glucose-Capillary 136 (*) 70 - 99 mg/dL    Intake/Output Summary (Last 24 hours) at 09/17/13 0835 Last data filed at 09/16/13 2302  Gross per 24 hour  Intake    840 ml  Output    400 ml  Net    440 ml     ASSESSMENT AND PLAN:  CKD:    Creat up slightly.  We will check it again in the AM if he is here.  If he goes out today check it mid week.    ACUTE ON CHRONIC SYSTOLIC AND DIASTOLIC HF:  I will supplement the potassium again today.  Creat went back up off of dopamine.  Still Ok to go home however as he feels better.  We will keep him off of   ATRIAL FIB:  INR therapeutic.      HTN:  BP OK.  Continue current meds.   HYPOKALEMIA:   Supplemented.  Repeat BMET soon after discharge.  DIABETES:  Continue current therapy.    Rollene Rotunda 09/17/2013 8:35 AM

## 2013-09-18 ENCOUNTER — Non-Acute Institutional Stay (SKILLED_NURSING_FACILITY): Payer: Medicare Other | Admitting: Internal Medicine

## 2013-09-18 ENCOUNTER — Encounter (HOSPITAL_COMMUNITY): Payer: Self-pay | Admitting: Nurse Practitioner

## 2013-09-18 ENCOUNTER — Other Ambulatory Visit: Payer: Self-pay | Admitting: Internal Medicine

## 2013-09-18 ENCOUNTER — Other Ambulatory Visit: Payer: Medicare Other

## 2013-09-18 DIAGNOSIS — R6889 Other general symptoms and signs: Secondary | ICD-10-CM | POA: Diagnosis not present

## 2013-09-18 DIAGNOSIS — N179 Acute kidney failure, unspecified: Secondary | ICD-10-CM

## 2013-09-18 DIAGNOSIS — E1129 Type 2 diabetes mellitus with other diabetic kidney complication: Secondary | ICD-10-CM

## 2013-09-18 DIAGNOSIS — D631 Anemia in chronic kidney disease: Secondary | ICD-10-CM | POA: Diagnosis not present

## 2013-09-18 DIAGNOSIS — E039 Hypothyroidism, unspecified: Secondary | ICD-10-CM | POA: Diagnosis not present

## 2013-09-18 DIAGNOSIS — I4891 Unspecified atrial fibrillation: Secondary | ICD-10-CM | POA: Diagnosis not present

## 2013-09-18 DIAGNOSIS — E876 Hypokalemia: Secondary | ICD-10-CM | POA: Diagnosis not present

## 2013-09-18 DIAGNOSIS — L89509 Pressure ulcer of unspecified ankle, unspecified stage: Secondary | ICD-10-CM | POA: Diagnosis not present

## 2013-09-18 DIAGNOSIS — E119 Type 2 diabetes mellitus without complications: Secondary | ICD-10-CM | POA: Diagnosis not present

## 2013-09-18 DIAGNOSIS — I5041 Acute combined systolic (congestive) and diastolic (congestive) heart failure: Secondary | ICD-10-CM

## 2013-09-18 DIAGNOSIS — R269 Unspecified abnormalities of gait and mobility: Secondary | ICD-10-CM | POA: Diagnosis not present

## 2013-09-18 DIAGNOSIS — M6281 Muscle weakness (generalized): Secondary | ICD-10-CM | POA: Diagnosis not present

## 2013-09-18 DIAGNOSIS — E232 Diabetes insipidus: Secondary | ICD-10-CM | POA: Diagnosis not present

## 2013-09-18 DIAGNOSIS — E870 Hyperosmolality and hypernatremia: Secondary | ICD-10-CM | POA: Diagnosis not present

## 2013-09-18 DIAGNOSIS — N19 Unspecified kidney failure: Secondary | ICD-10-CM | POA: Diagnosis not present

## 2013-09-18 DIAGNOSIS — Z7901 Long term (current) use of anticoagulants: Secondary | ICD-10-CM

## 2013-09-18 DIAGNOSIS — L8993 Pressure ulcer of unspecified site, stage 3: Secondary | ICD-10-CM | POA: Diagnosis not present

## 2013-09-18 DIAGNOSIS — I428 Other cardiomyopathies: Secondary | ICD-10-CM

## 2013-09-18 LAB — BASIC METABOLIC PANEL WITH GFR
BUN: 25 mg/dL — ABNORMAL HIGH (ref 6–23)
CO2: 29 meq/L (ref 19–32)
Calcium: 9.5 mg/dL (ref 8.4–10.5)
Chloride: 109 meq/L (ref 96–112)
Creatinine, Ser: 1.96 mg/dL — ABNORMAL HIGH (ref 0.50–1.35)
GFR calc Af Amer: 37 mL/min — ABNORMAL LOW
GFR calc non Af Amer: 32 mL/min — ABNORMAL LOW
Glucose, Bld: 97 mg/dL (ref 70–99)
Potassium: 4.1 meq/L (ref 3.5–5.1)
Sodium: 146 meq/L — ABNORMAL HIGH (ref 135–145)

## 2013-09-18 LAB — GLUCOSE, CAPILLARY: Glucose-Capillary: 107 mg/dL — ABNORMAL HIGH (ref 70–99)

## 2013-09-18 NOTE — Progress Notes (Signed)
Pt provided with dc instrcutions and education. Pt verbalized understanding. Pt being transported to [facility] via family. IV removed with tip intact. Heart monitor cleaned and returned to front. Levonne Spiller, RN

## 2013-09-18 NOTE — Progress Notes (Signed)
CSW (Clinical Child psychotherapist) prepared pt dc summary and gave to pt daughter. Pt daughter to transport pt to facility. Facility aware of pt dc. CSW signing off.  Tatisha Cerino, LCSWA [phone]

## 2013-09-18 NOTE — Progress Notes (Signed)
    SUBJECTIVE:  No SOB.  No chest pain. Going to rehab today.  He walked yesterday and reports only leg weakness.     PHYSICAL EXAM Filed Vitals:   09/17/13 0508 09/17/13 1349 09/17/13 2106 09/18/13 0508  BP: 154/85 131/73 168/85 150/83  Pulse: 66 59 67 72  Temp: 98.5 F (36.9 C) 98 F (36.7 C) 98.7 F (37.1 C) 100 F (37.8 C)  TempSrc: Oral Oral Oral Oral  Resp: 18 20 18 18   Height:      Weight: 206 lb 4.8 oz (93.577 kg)   207 lb 14.3 oz (94.3 kg)  SpO2: 99% 100% 100% 97%   General:  No distress Lungs:  Clear Heart:  Irregular Abdomen:  Positive bowel sounds, no rebound no guarding Extremities:  Mild edema, legs wrapped.     LABS:  Results for orders placed during the hospital encounter of 09/13/13 (from the past 24 hour(s))  GLUCOSE, CAPILLARY     Status: Abnormal   Collection Time    09/17/13  8:05 AM      Result Value Range   Glucose-Capillary 136 (*) 70 - 99 mg/dL  GLUCOSE, CAPILLARY     Status: Abnormal   Collection Time    09/17/13 11:36 AM      Result Value Range   Glucose-Capillary 109 (*) 70 - 99 mg/dL  GLUCOSE, CAPILLARY     Status: None   Collection Time    09/17/13  4:42 PM      Result Value Range   Glucose-Capillary 87  70 - 99 mg/dL   Comment 1 Notify RN    GLUCOSE, CAPILLARY     Status: None   Collection Time    09/17/13  9:50 PM      Result Value Range   Glucose-Capillary 82  70 - 99 mg/dL   Comment 1 Notify RN      Intake/Output Summary (Last 24 hours) at 09/18/13 4540 Last data filed at 09/17/13 1700  Gross per 24 hour  Intake     15 ml  Output      0 ml  Net     15 ml     ASSESSMENT AND PLAN:  CKD:    Creat up slightly yesterday.  Pending today.   ACUTE ON CHRONIC SYSTOLIC AND DIASTOLIC HF:  We will keep him off of ACE and diuretic for now.   ATRIAL FIB:  INR therapeutic.      HTN:  BP OK.  Continue current meds.   HYPOKALEMIA:   Supplemented.  Repeat BMET soon after discharge.  He will need TOC appt with [facility].     DIABETES:  Continue current therapy.    Fayrene Fearing [facility] 09/18/2013 6:37 AM

## 2013-09-18 NOTE — Progress Notes (Addendum)
CSW (Clinical Social Worker) left message for facility to notify of pt dc today. CSW spoke with pt wife who notified CSW that family will be providing transportation to the facility.  Sherriann Szuch, LCSWA [phone]

## 2013-09-18 NOTE — Progress Notes (Signed)
Patient ID: Frank Mclean, male   DOB: 11-09-1941, 72 y.o.   MRN: [id number]  Facility; [facility] Chief complaint; admission to SNF post admit to [facility]  from September 25 to September 30  History; this is a 72 year old man with a history of combined systolic and diastolic heart failure echocardiogram in June 2014 showed an EF of 20-30% with diffuse hypokinesis restrictive physiology mild AR. He was in hospital in June for atrial fibrillation with rapid ventricular response complicated by non-ST elevation MI combined systolic and diastolic heart failure. He was treated with amiodarone per he was seen in the by his cardiologist's office recently with worsening lower extremity edema per he noted increased dyspnea on exertion weeping 3 pillow edema. Chest x-ray was clear his BNP was not significantly elevated. In followup he was found to for weakness, shortness of breath on exertion and left hand weakness. He was found to be profoundly hypokalemic with worsening creatinine from 2.6-3.3 he was admitted to hospital.  [name] diuretics were held and he was gently hot hydrated. His potassium was replaced the. He was seen by the wet wound care team he was started on low-dose dopamine appear his creatinine continued to improve and the dopamine was stopped on September 28 of. He was felt to be weak and can and deconditioned his wife was unable to care for him. He is therefore sent to SNF for continued monitoring, physical therapy and wound care. He is not on any diuretics at time of discharge. His creatinine at discharge was 1.96  With regards to his lower extremity wounds he is followed by Dr. Jimmey Ralph at the wound care center. This initially started as some form of trauma according to the patient. Then subsequently following a hospitalization he was apparently immobilized therefore there may be a pressure component to. The wounds are on the bilateral legs to roughly over the Achilles area. He has been  receiving Santyl based dressings to the wounds. I note that he had arterial Dopplers several months ago. These showed an ABI of 0.8 on the left 0.88 the right. However the wave forms in the anterior posterior tibial arteries were monophasic and dampened suggesting significant disease in this area    Patient:    Frank Mclean, Frank Mclean MR #:       [id number] Study Date: 05/25/2013 Gender:     M Age:        66 Height:     188cm Weight:     104.3kg BSA:        2.54m^2 Pt. Status: Room:    ATTENDING    Nahser, Philip  REFERRING    Plotnikov, Georgina Quint  PERFORMING   [facility] Gainer, [facility]  SONOGRAPHER  Philomena Course, RDCS  ORDERING     Ardath Sax  Lottie Mussel, Peter cc:  ------------------------------------------------------------ LV EF: 25% -   30%  ------------------------------------------------------------ Indications:      CHF - 428.0.  ------------------------------------------------------------ History:   PMH:  Peripheral vascular disease. Acquired from the patient and from the patient's chart.  Dyspnea. Coronary artery disease.  Non-ischemic cardiomyopathy. Congestive heart failure.  Stroke.  Risk factors:  Former tobacco use. Hypertension. Diabetes mellitus. Dyslipidemia.   ------------------------------------------------------------ Study Conclusions  - Left ventricle: Prominant trabeculation in LV apex The   cavity size was normal. Systolic function was severely   reduced. The estimated ejection fraction was in the range   of 25% to 30%. Hypokinesis of the entire myocardium.   Doppler  parameters are consistent with restrictive   physiology, indicative of decreased left ventricular   diastolic compliance and/or increased left atrial   pressure. - Aortic valve: Trivial regurgitation. - Mitral valve: Mild regurgitation. - Left atrium: The atrium was moderately dilated. - Right ventricle: Systolic function was reduced. - Right atrium: The atrium was mildly  dilated. - Atrial septum: No defect or patent foramen ovale was   identified. - Pulmonary arteries: PA peak pressure: 42mm Hg (S). Transthoracic echocardiography.  M-mode, complete 2D, spectral Doppler, and color Doppler.  Height:  Height: 188cm. Height: 74in.  Weight:  Weight: 104.3kg. Weight: 229.5lb.  Body mass index:  BMI: 29.5kg/m^2.  Body surface area:    BSA: 2.52m^2.  Blood pressure:     155/97.  Patient status:  Outpatient.  Location:  [place] [facility]  ------------------------------------------------------------  ------------------------------------------------------------ Left ventricle:  Prominant trabeculation in LV apex ASH. The cavity size was normal. Systolic function was severely reduced. The estimated ejection fraction was in the range of 25% to 30%.  Regional wall motion abnormalities: Hypokinesis of the entire myocardium. Doppler parameters are consistent with restrictive physiology, indicative of decreased left ventricular diastolic compliance and/or increased left atrial pressure.  ------------------------------------------------------------ Aortic valve:   Trileaflet. Sclerosis without stenosis. Doppler:   Trivial regurgitation.  ------------------------------------------------------------ Mitral valve:   Structurally normal valve.   Leaflet separation was normal.  Doppler:  Transvalvular velocity was within the normal range. There was no evidence for stenosis.  Mild regurgitation.    Peak gradient: 6mm Hg (D).  ------------------------------------------------------------ Left atrium:  The atrium was moderately dilated.  ------------------------------------------------------------ Atrial septum:  No defect or patent foramen ovale was identified.  ------------------------------------------------------------ Right ventricle:  The cavity size was normal. Wall thickness was normal. Systolic function was  reduced.  ------------------------------------------------------------ Pulmonic valve:    Structurally normal valve.   Cusp separation was normal.  Doppler:  Transvalvular velocity was within the normal range.  Trivial regurgitation.  ------------------------------------------------------------ Tricuspid valve:   Structurally normal valve.   Leaflet separation was normal.  Doppler:  Transvalvular velocity was within the normal range.  Trivial regurgitation.  ------------------------------------------------------------ Pulmonary artery:   The main pulmonary artery was normal-sized.  ------------------------------------------------------------ Right atrium:  The atrium was mildly dilated.  ------------------------------------------------------------ Pericardium:  The pericardium was normal in appearance.  ------------------------------------------------------------ Systemic veins: Inferior vena cava: The vessel was normal in size; the respirophasic diameter changes were in the normal range (= 50%); findings are consistent with normal central venous pressure.  ------------------------------------------------------------  2D measurements        Normal  Doppler measurements    Norma Left ventricle                                         l LVID ED,   61.5 mm     43-52   Main pulmonary artery chord,                         Pressure,   42 mm Hg    =30 PLAX                           S LVID ES,   52.6 mm     23-38   Left ventricle chord,  Ea, lat    6.3 cm/s     ----- PLAX                           ann, tiss    4 FS, chord,   14 %      >29     DP PLAX                           E/Ea, lat  20.          ----- LVPW, ED   11.7 mm     ------  ann, tiss   03 IVS/LVPW   1.68        <1.3    DP ratio, ED                      Ea, med    3.8 cm/s     ----- Ventricular septum             ann, tiss IVS, ED    19.6 mm     ------  DP LVOT                           E/Ea, med   33.          ----- Diam, S      22 mm     ------  ann, tiss   42 Area        3.8 cm^2   ------  DP Diam         22 mm     ------  LVOT Aorta                          Peak vel,  75. cm/s     ----- Root diam,   35 mm     ------  S            9 ED                             VTI, S     15. cm       ----- Left atrium                                 9 AP dim       53 mm     ------  HR          67 bpm      ----- AP dim     2.29 cm/m^2 <2.2    Stroke vol 60. ml       ----- index                                       4                                Cardiac      4 L/min    -----  output                                Cardiac    1.8 L/(min-m -----                                index          ^2)                                Stroke     26. ml/m^2   -----                                index        2                                Mitral valve                                Peak E vel 127 cm/s     -----                                Peak A vel  36 cm/s     -----                                Decelerati 158 ms       150-2                                on time                 30                                Peak         6 mm Hg    -----                                gradient,                                D                                Peak E/A   3.5          -----                                ratio                                Tricuspid valve  Regurg     305 cm/s     -----                                peak vel                                Peak RV-RA  37 mm Hg    -----                                gradient,                                S                                Systemic veins                                Estimated    5 mm Hg    -----                                CVP                                Right ventricle                                Pressure,   42 mm Hg    <30                                 S                                Sa vel,    13. cm/s     -----                                lat ann,     6                                tiss DP    Past Medical History  Diagnosis Date  . Gout   . HTN (hypertension)   . Hyperlipidemia   . Osteoarthritis   . History of CVA (cerebrovascular accident)     Left pontine infarct July 2004; changed from Plavix to Coumadin in 2004 per MD at [facility]  . GERD (gastroesophageal reflux disease)   . DJD (degenerative joint disease)   . BPH (benign prostatic hypertrophy)   . DM (diabetes mellitus), type 2   . Peripheral vascular disease   . Bilateral leg ulcer     ACHILLES AREA--  NONHEALING  . CKD (chronic kidney disease) stage 3, GFR 30-59 ml/min   . CAD (coronary artery disease)  a. LHC 4/12: Mid LAD 25%, mid diagonal 30%, AV circumflex 40%, proximal OM 25%, distal RCA 40%  . Chronic combined systolic and diastolic heart failure     a. Echo 05/2013: EF 25-30%, diffuse HK, restrictive physiology, trivial AI, mild MR, moderate LAE, reduced RV systolic function, PASP 42  . LBBB (left bundle branch block)   . Atrial fibrillation     a. amiodarone Rx started 05/2013;  b. chronic coumadin  . NICM (nonischemic cardiomyopathy)     a. EF 25-30%.   Past Surgical History  Procedure Laterality Date  . Cardiac catheterization  04-16-2011   DR [facility]    NON-OBSTRUCTIVE CAD. MILDLY ELEVATED PULMONARY PRESSURES/ ELEVATED  END-DIASTOLIC PRESSURE  . Transthoracic echocardiogram  12-31-2010    MODERATE CONCENTRIC LVH/ SYSTOLIC FUNCTION SEVERELY REDUCED/ EF 25-30%/  SEVERE HYPOKINESIS OF ANTEROSEPTAL MYOCARDIUM  AND ENTIREAPICAL MYOCARDIUM /  MODERATE HYPOKINESIS OF LATERAL, INFEROLATERAL, INFERIOR,AND INFEROSEPTAL MYOCARIUM/  GRADE 3 DIASTOLIC DYSFUNCTION/ MILD MR  . Aortogram w/ bilateral lower extremitiy runoff  05-18-2013  DR FIELDS    LEFT LEG OCCLUDED PERONEAL AND ANTERIOR TIBIAL ARTERIES/ HIGH GRADE STENOIS 80% MIDDLE AND DISTAL THIRD OF  POSTERIOR TIBIAL ARTERY/ RIGHT PERONEAL AND ANTERIOR TIBIAL ARTERY OCCLUDED/ 40% STENOSIS DISTALLY    Medications Amiodarone 200 mg daily Lipitor 20 mg each bedtime Symbicort 2 puffs daily as needed for shortness of breath Vitamin D 1000 units daily Vitamin B12 thousand micrograms daily doxycycline 102 times daily ferrous sulfate 325 twice a day, glipizide 2.5 twice a day, hydralazine 50 mg 3 times daily, Norco one tablet 2 times daily as needed 5/325, Imdur 60 mg daily, labetalol 200 mg twice a day, Nitrostat when necessary, KCL 20 mEq 3 times daily, Coumadin one half tablet 2.5 mg Monday and Friday one tablet on other days   reports that he quit smoking about 39 years ago. He has never used smokeless tobacco. He reports that he does not drink alcohol or use illicit drugs.  Tells me he was previously fairly functional he has not walked much in the last month due to illnesses wounds etc. his wife is in [facility] with a broken hip  family history includes Diabetes in his mother; Gout in his other; Heart disease in his father and mother; Hypertension in his father and mother; Stroke in his other.  Review of systems Respiratory no shortness of breath Cardiac not complaining of chest pain or palpitations GU no obvious voiding difficulties Extremities he does not describe claudication at rest  Physical examination Pulse rate 70 and regular respirations 18 O2 sat 95% on room air Gen.; he is not in any overt distress Respiratory absolutely clear entry bilaterally no crackles or wheezes are noted Cardiac heart sounds are normal no murmurs no S3 no S4 JVP is not elevated no overt edema Abdomen not distended no liver no spleen no tenderness Extremities; I cannot feel his peripheral pulses below his popliteal over the popliteal pulses are brisk bilateral Skin; he has bilateral wounds over the Achilles areas on both of his legs. The area on the left it does not really look good too bad the wounds  for healthy tissue and some evidence of advancing epithelialization. However the area over the right Achilles area is covered in a liquefied slough. This is malodorous but not overtly infected.  Neurologic; did a careful and upper extremity examination. He states his left hand weakness is improving. Started before he went in the hospital and was worse while he was in the  hospital. His exam shows normal median and ulnar nerve function however he has weakness in extension of his fingers and extension at the wrist. His triceps is strong. Reflexes are maintained  Impression/plan #1 combined systolic and diastolic heart failure. I gather from his records he was on Demadex 100 mg before he came into the hospital. This was stopped and he is not on any diuretics currently. We'll follow his weights careful. I don't see any evidence of heart failure currently right or left side #2 chronic renal insufficiency with an acute component in the hospital. This appears to have stabilized #3 atrial fibrillation actually feels fairly well controlled and regular. He is on Coumadin we'll monitor this. Started on amiodarone I believe in June #4 bilateral lower extremity wounds as described. The exact pathogenesis of this isn't really clear. He certainly does have PAD and there may have been a pressure component given to me by the patient in the history. He follows with Dr. Jimmey Ralph at the wound care center on Wednesday  seen he has an appointment tomorrow I think in the emphasis of continuity of care he would be well worth continuing disappeared I suspect he will need further debridement on the right leg wounds. For now we have dressed this with Santyl a Telfa cover Kerlix and Coban wrap  #5 I suspect his left arm weakness is secondary to her radial nerve compression probably below the elbow. This is improving no further tests or imaging are required. 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 He was  already told that he could not handle the contrast load of an angiogram. His lab work will be rechecked. He is on oral agents #7 hypertension we'll need to monitor this while he is here on hydralazine, labetalol #8 hyperlipidemia on Lipitor. I see no reason to order followup of this he follows with cardiology. #5

## 2013-09-19 ENCOUNTER — Encounter (HOSPITAL_BASED_OUTPATIENT_CLINIC_OR_DEPARTMENT_OTHER): Payer: Medicare Other | Attending: General Surgery

## 2013-09-19 DIAGNOSIS — L8993 Pressure ulcer of unspecified site, stage 3: Secondary | ICD-10-CM | POA: Diagnosis not present

## 2013-09-19 DIAGNOSIS — L89509 Pressure ulcer of unspecified ankle, unspecified stage: Secondary | ICD-10-CM | POA: Insufficient documentation

## 2013-09-22 ENCOUNTER — Non-Acute Institutional Stay (SKILLED_NURSING_FACILITY): Payer: Medicare Other | Admitting: Internal Medicine

## 2013-09-22 DIAGNOSIS — I5041 Acute combined systolic (congestive) and diastolic (congestive) heart failure: Secondary | ICD-10-CM | POA: Diagnosis not present

## 2013-09-22 DIAGNOSIS — N179 Acute kidney failure, unspecified: Secondary | ICD-10-CM

## 2013-09-26 ENCOUNTER — Encounter: Payer: Medicare Other | Admitting: Physician Assistant

## 2013-09-26 ENCOUNTER — Other Ambulatory Visit: Payer: Medicare Other

## 2013-09-26 DIAGNOSIS — L89509 Pressure ulcer of unspecified ankle, unspecified stage: Secondary | ICD-10-CM | POA: Diagnosis not present

## 2013-09-26 DIAGNOSIS — L8993 Pressure ulcer of unspecified site, stage 3: Secondary | ICD-10-CM | POA: Diagnosis not present

## 2013-10-02 NOTE — Progress Notes (Signed)
Patient ID: NIVIN BRANIFF, male   DOB: Sep 06, 1941, 72 y.o.   MRN: [id number]           PROGRESS NOTE  DATE:  09/21/2013  FACILITY: [facility]    LEVEL OF CARE:   SNF   Acute Visit   CHIEF COMPLAINT:  Follow up CHF.    HISTORY OF PRESENT ILLNESS:   Mr. Douthat is a gentleman who came to [place] after a stay at [facility] from 09/13/2013 through 09/18/2013.    He has known chronic combined systolic and diastolic heart failure with a discharge weight of 207 pounds.  He also has acute-on-chronic renal insufficiency.  He was significantly hypokalemic in the hospital and this was replaced.    Interestingly, he also had elevated sodium levels with a discharge sodium of 146.  His discharge creatinine was 1.96, discharge BUN at 25.  He did not come to [place] on any diuretics and his diuretics remained on hold at the time of discharge.  He follows up with Cardiology on 09/26/2013, Dr. Antoine Poche.  With regards to his weights, his weight on 09/20/2013 was 212; weight on 09/21/2013, 215.  This represents an 8-lb weight gain.    Paradoxically, his lab work from 09/20/2013 showed a sodium of 150, potassium of 3.9 . His BUN was 24, creatinine 2.07.  His hemoglobin was 8.5, white count 10.4.   Discharge hemoglobin was 9.8.    CURRENT MEDICATIONS:  Medication list is reviewed.     He is on potassium at 20  t.i.d.    As mentioned, he is not on diuretics.    His Coumadin is being followed currently on 3.5 mg with an INR check next Tuesday.      REVIEW OF SYSTEMS:   CHEST/RESPIRATORY:  He is not complaining of shortness of breath.   CARDIAC:   No chest pain.   GI:  No nausea,  vomiting or abdominal pain.   MUSCULOSKELETAL:  His legs are wrapped.  However, he does not complain of pain.  He is on doxycycline.    PHYSICAL EXAMINATION:   GENERAL APPEARANCE:  The patient is not in any distress.   CHEST/RESPIRATORY:  Shallow air entry bilaterally.  No crackles or wheezes.   CARDIOVASCULAR:  CARDIAC:   Heart  sounds are normal.  His JVP was not visible at 90 degrees.  There is no S3.    ASSESSMENT/PLAN:  Congestive heart failure.  He has put on eight pounds since his arrival in the building, a 2-lb gain since yesterday.  He has scant sacral edema which is new.  There is probably more lower extremity edema, as well.  I am going to restart his Demadex at 40 mg a day.  I will continue his potassium at 20  t.i.d.    Acute-on-chronic renal failure.  This seems to have stabilized nicely.  That will be rechecked, as well, on Monday.      Hypernatremia.  This is in the setting of apparent fluid volume excess, which is a bit puzzling.  I will monitor this early next week.  I might be tempted to write off one episode although it was elevated in the hospital, as well.    CPT CODE: 40981

## 2013-10-03 DIAGNOSIS — L8993 Pressure ulcer of unspecified site, stage 3: Secondary | ICD-10-CM | POA: Diagnosis not present

## 2013-10-03 DIAGNOSIS — L89509 Pressure ulcer of unspecified ankle, unspecified stage: Secondary | ICD-10-CM | POA: Diagnosis not present

## 2013-10-04 ENCOUNTER — Non-Acute Institutional Stay (SKILLED_NURSING_FACILITY): Payer: Medicare Other | Admitting: Internal Medicine

## 2013-10-04 DIAGNOSIS — E870 Hyperosmolality and hypernatremia: Secondary | ICD-10-CM

## 2013-10-04 DIAGNOSIS — D631 Anemia in chronic kidney disease: Secondary | ICD-10-CM | POA: Diagnosis not present

## 2013-10-04 DIAGNOSIS — E039 Hypothyroidism, unspecified: Secondary | ICD-10-CM

## 2013-10-05 ENCOUNTER — Telehealth: Payer: Self-pay | Admitting: *Deleted

## 2013-10-05 DIAGNOSIS — E119 Type 2 diabetes mellitus without complications: Secondary | ICD-10-CM | POA: Diagnosis not present

## 2013-10-05 DIAGNOSIS — I10 Essential (primary) hypertension: Secondary | ICD-10-CM | POA: Diagnosis not present

## 2013-10-05 DIAGNOSIS — L97209 Non-pressure chronic ulcer of unspecified calf with unspecified severity: Secondary | ICD-10-CM | POA: Diagnosis not present

## 2013-10-05 DIAGNOSIS — I872 Venous insufficiency (chronic) (peripheral): Secondary | ICD-10-CM | POA: Diagnosis not present

## 2013-10-05 DIAGNOSIS — I509 Heart failure, unspecified: Secondary | ICD-10-CM | POA: Diagnosis not present

## 2013-10-05 NOTE — Telephone Encounter (Signed)
Ok Thx 

## 2013-10-05 NOTE — Telephone Encounter (Signed)
Joni Reining called states she resumed care for [facility] aide, PT evaluation, and skilled nursing.  Please advise

## 2013-10-08 ENCOUNTER — Telehealth: Payer: Self-pay | Admitting: Internal Medicine

## 2013-10-08 ENCOUNTER — Encounter: Payer: Medicare Other | Admitting: Physician Assistant

## 2013-10-08 ENCOUNTER — Other Ambulatory Visit: Payer: Medicare Other

## 2013-10-08 DIAGNOSIS — I509 Heart failure, unspecified: Secondary | ICD-10-CM | POA: Diagnosis not present

## 2013-10-08 DIAGNOSIS — E119 Type 2 diabetes mellitus without complications: Secondary | ICD-10-CM | POA: Diagnosis not present

## 2013-10-08 DIAGNOSIS — I872 Venous insufficiency (chronic) (peripheral): Secondary | ICD-10-CM | POA: Diagnosis not present

## 2013-10-08 DIAGNOSIS — L97209 Non-pressure chronic ulcer of unspecified calf with unspecified severity: Secondary | ICD-10-CM | POA: Diagnosis not present

## 2013-10-08 DIAGNOSIS — I10 Essential (primary) hypertension: Secondary | ICD-10-CM | POA: Diagnosis not present

## 2013-10-08 NOTE — Telephone Encounter (Signed)
Spoke with Joni Reining advised of MDs message

## 2013-10-08 NOTE — Telephone Encounter (Signed)
Jolieene is OT for [facility], she did OT evaluation for [name] and he is independent with his self care except for showering, she is requesting weekly visits for bathroom safety  Call with verbal OK

## 2013-10-08 NOTE — Telephone Encounter (Signed)
Ok Thx 

## 2013-10-09 ENCOUNTER — Ambulatory Visit (INDEPENDENT_AMBULATORY_CARE_PROVIDER_SITE_OTHER): Payer: Medicare Other | Admitting: Internal Medicine

## 2013-10-09 ENCOUNTER — Other Ambulatory Visit (INDEPENDENT_AMBULATORY_CARE_PROVIDER_SITE_OTHER): Payer: Medicare Other

## 2013-10-09 ENCOUNTER — Encounter: Payer: Self-pay | Admitting: Internal Medicine

## 2013-10-09 VITALS — BP 140/98 | HR 80 | Temp 98.2°F | Resp 16 | Wt 211.0 lb

## 2013-10-09 DIAGNOSIS — I2510 Atherosclerotic heart disease of native coronary artery without angina pectoris: Secondary | ICD-10-CM

## 2013-10-09 DIAGNOSIS — I4891 Unspecified atrial fibrillation: Secondary | ICD-10-CM

## 2013-10-09 DIAGNOSIS — D509 Iron deficiency anemia, unspecified: Secondary | ICD-10-CM

## 2013-10-09 DIAGNOSIS — R5381 Other malaise: Secondary | ICD-10-CM

## 2013-10-09 DIAGNOSIS — I6350 Cerebral infarction due to unspecified occlusion or stenosis of unspecified cerebral artery: Secondary | ICD-10-CM

## 2013-10-09 DIAGNOSIS — I10 Essential (primary) hypertension: Secondary | ICD-10-CM

## 2013-10-09 DIAGNOSIS — R413 Other amnesia: Secondary | ICD-10-CM | POA: Diagnosis not present

## 2013-10-09 DIAGNOSIS — R269 Unspecified abnormalities of gait and mobility: Secondary | ICD-10-CM

## 2013-10-09 LAB — BASIC METABOLIC PANEL
CO2: 32 mEq/L (ref 19–32)
Calcium: 8.8 mg/dL (ref 8.4–10.5)
Chloride: 104 mEq/L (ref 96–112)
Sodium: 144 mEq/L (ref 135–145)

## 2013-10-09 MED ORDER — TORSEMIDE 20 MG PO TABS: 40.0000 mg | ORAL_TABLET | Freq: Every day | ORAL | Status: DC

## 2013-10-09 NOTE — Progress Notes (Signed)
   Subjective:    HPI  f/u B leg pain -better  F/u: Near syncope  Active Problems:  DM (diabetes mellitus), type 2 with renal complications  HYPERLIPIDEMIA  HYPERTENSION  Acute on chronic combined systolic and diastolic congestive heart failure  CVA  CKD (chronic kidney disease) stage 3, GFR 30-59 ml/min  Wound, open, leg  Atherosclerotic peripheral vascular disease with intermittent claudication  Orthostatic hypotension  Hypokalemia  Elevated troponin  Atrial fibrillation with RVR  Wt Readings from Last 3 Encounters:  10/09/13 211 lb (95.709 kg)  09/18/13 207 lb 14.3 oz (94.3 kg)  09/13/13 206 lb (93.441 kg)   BP Readings from Last 3 Encounters:  10/09/13 140/98  09/18/13 133/76  09/13/13 137/76      Review of Systems  Constitutional: Positive for fatigue. Negative for appetite change and unexpected weight change.  HENT: Negative for congestion, nosebleeds, sneezing and trouble swallowing.   Eyes: Negative for itching and visual disturbance.  Cardiovascular: Positive for leg swelling. Negative for palpitations.  Gastrointestinal: Negative for nausea, diarrhea, blood in stool and abdominal distention.  Genitourinary: Negative for frequency and hematuria.  Musculoskeletal: Positive for arthralgias. Negative for back pain, gait problem, joint swelling and neck pain.  Neurological: Negative for dizziness, tremors, speech difficulty and weakness.  Psychiatric/Behavioral: Negative for sleep disturbance, dysphoric mood and agitation. The patient is nervous/anxious.        Objective:   Physical Exam  Constitutional: He is oriented to person, place, and time. He appears well-developed.  HENT:  Mouth/Throat: Oropharynx is clear and moist.  Eyes: Conjunctivae are normal. Pupils are equal, round, and reactive to light.  Neck: Normal range of motion. No JVD present. No thyromegaly present.  Cardiovascular: Normal rate, regular rhythm, normal heart sounds and intact distal  pulses.  Exam reveals no gallop and no friction rub.   No murmur heard. Pulmonary/Chest: Effort normal and breath sounds normal. No respiratory distress. He has no wheezes. He has no rales. He exhibits no tenderness.  Abdominal: Soft. Bowel sounds are normal. He exhibits no distension and no mass. There is no tenderness. There is no rebound and no guarding.  Musculoskeletal: Normal range of motion. He exhibits edema (trace B). He exhibits no tenderness.  Lymphadenopathy:    He has no cervical adenopathy.  Neurological: He is alert and oriented to person, place, and time. He has normal reflexes. No cranial nerve deficit. He exhibits normal muscle tone. Coordination normal.  Skin: Skin is warm and dry. No rash noted.    Hyperpigmented ankles  Psychiatric: He has a normal mood and affect. His behavior is normal. Judgment and thought content normal.  B LEs are wraped  Lab Results  Component Value Date   WBC 11.9* 09/14/2013   HGB 9.8* 09/14/2013   HCT 31.5* 09/14/2013   PLT 262 09/14/2013   GLUCOSE 97 09/18/2013   CHOL 133 04/17/2013   TRIG 103.0 04/17/2013   HDL 42.20 04/17/2013   LDLDIRECT 177.9 06/17/2010   LDLCALC 70 04/17/2013   ALT 15 09/13/2013   AST 15 09/13/2013   NA 146* 09/18/2013   K 4.1 09/18/2013   CL 109 09/18/2013   CREATININE 1.96* 09/18/2013   BUN 25* 09/18/2013   CO2 29 09/18/2013   TSH 0.90 08/24/2013   PSA 0.01* 11/18/2011   INR 2.52* 09/17/2013   HGBA1C 6.3 07/05/2013          Assessment & Plan:

## 2013-10-09 NOTE — Assessment & Plan Note (Signed)
On iron 

## 2013-10-09 NOTE — Assessment & Plan Note (Signed)
No relapse 

## 2013-10-09 NOTE — Telephone Encounter (Signed)
Verbal given to [facility] for below.

## 2013-10-09 NOTE — Assessment & Plan Note (Signed)
Better  

## 2013-10-09 NOTE — Assessment & Plan Note (Signed)
Post CVA Using a walker PT

## 2013-10-09 NOTE — Assessment & Plan Note (Signed)
Continue with current prescription therapy as reflected on the Med list.  

## 2013-10-09 NOTE — Assessment & Plan Note (Signed)
Chronic and multifactorial 

## 2013-10-10 DIAGNOSIS — I509 Heart failure, unspecified: Secondary | ICD-10-CM | POA: Diagnosis not present

## 2013-10-10 DIAGNOSIS — L89509 Pressure ulcer of unspecified ankle, unspecified stage: Secondary | ICD-10-CM | POA: Diagnosis not present

## 2013-10-10 DIAGNOSIS — I10 Essential (primary) hypertension: Secondary | ICD-10-CM | POA: Diagnosis not present

## 2013-10-10 DIAGNOSIS — E119 Type 2 diabetes mellitus without complications: Secondary | ICD-10-CM | POA: Diagnosis not present

## 2013-10-10 DIAGNOSIS — L97209 Non-pressure chronic ulcer of unspecified calf with unspecified severity: Secondary | ICD-10-CM | POA: Diagnosis not present

## 2013-10-10 DIAGNOSIS — I872 Venous insufficiency (chronic) (peripheral): Secondary | ICD-10-CM | POA: Diagnosis not present

## 2013-10-10 DIAGNOSIS — L8993 Pressure ulcer of unspecified site, stage 3: Secondary | ICD-10-CM | POA: Diagnosis not present

## 2013-10-12 ENCOUNTER — Ambulatory Visit (INDEPENDENT_AMBULATORY_CARE_PROVIDER_SITE_OTHER): Payer: Medicare Other | Admitting: Cardiology

## 2013-10-12 DIAGNOSIS — L97209 Non-pressure chronic ulcer of unspecified calf with unspecified severity: Secondary | ICD-10-CM | POA: Diagnosis not present

## 2013-10-12 DIAGNOSIS — I509 Heart failure, unspecified: Secondary | ICD-10-CM | POA: Diagnosis not present

## 2013-10-12 DIAGNOSIS — I872 Venous insufficiency (chronic) (peripheral): Secondary | ICD-10-CM | POA: Diagnosis not present

## 2013-10-12 DIAGNOSIS — I6350 Cerebral infarction due to unspecified occlusion or stenosis of unspecified cerebral artery: Secondary | ICD-10-CM

## 2013-10-12 DIAGNOSIS — E119 Type 2 diabetes mellitus without complications: Secondary | ICD-10-CM | POA: Diagnosis not present

## 2013-10-12 DIAGNOSIS — I10 Essential (primary) hypertension: Secondary | ICD-10-CM | POA: Diagnosis not present

## 2013-10-12 DIAGNOSIS — Z7901 Long term (current) use of anticoagulants: Secondary | ICD-10-CM

## 2013-10-12 LAB — POCT INR: INR: 2.5

## 2013-10-15 DIAGNOSIS — I509 Heart failure, unspecified: Secondary | ICD-10-CM | POA: Diagnosis not present

## 2013-10-15 DIAGNOSIS — L97209 Non-pressure chronic ulcer of unspecified calf with unspecified severity: Secondary | ICD-10-CM | POA: Diagnosis not present

## 2013-10-15 DIAGNOSIS — I872 Venous insufficiency (chronic) (peripheral): Secondary | ICD-10-CM | POA: Diagnosis not present

## 2013-10-15 DIAGNOSIS — I10 Essential (primary) hypertension: Secondary | ICD-10-CM | POA: Diagnosis not present

## 2013-10-15 DIAGNOSIS — E119 Type 2 diabetes mellitus without complications: Secondary | ICD-10-CM | POA: Diagnosis not present

## 2013-10-16 ENCOUNTER — Telehealth: Payer: Self-pay | Admitting: *Deleted

## 2013-10-16 ENCOUNTER — Other Ambulatory Visit (HOSPITAL_COMMUNITY): Payer: Self-pay | Admitting: Internal Medicine

## 2013-10-16 ENCOUNTER — Ambulatory Visit (INDEPENDENT_AMBULATORY_CARE_PROVIDER_SITE_OTHER): Payer: Medicare Other | Admitting: Physician Assistant

## 2013-10-16 ENCOUNTER — Encounter: Payer: Self-pay | Admitting: Physician Assistant

## 2013-10-16 ENCOUNTER — Other Ambulatory Visit: Payer: Medicare Other

## 2013-10-16 VITALS — BP 140/88 | HR 60 | Ht 74.5 in | Wt 211.0 lb

## 2013-10-16 DIAGNOSIS — E785 Hyperlipidemia, unspecified: Secondary | ICD-10-CM

## 2013-10-16 DIAGNOSIS — L97209 Non-pressure chronic ulcer of unspecified calf with unspecified severity: Secondary | ICD-10-CM | POA: Diagnosis not present

## 2013-10-16 DIAGNOSIS — I509 Heart failure, unspecified: Secondary | ICD-10-CM | POA: Diagnosis not present

## 2013-10-16 DIAGNOSIS — I4891 Unspecified atrial fibrillation: Secondary | ICD-10-CM | POA: Diagnosis not present

## 2013-10-16 DIAGNOSIS — I5043 Acute on chronic combined systolic (congestive) and diastolic (congestive) heart failure: Secondary | ICD-10-CM

## 2013-10-16 DIAGNOSIS — I5042 Chronic combined systolic (congestive) and diastolic (congestive) heart failure: Secondary | ICD-10-CM | POA: Diagnosis not present

## 2013-10-16 DIAGNOSIS — I10 Essential (primary) hypertension: Secondary | ICD-10-CM | POA: Diagnosis not present

## 2013-10-16 DIAGNOSIS — I2510 Atherosclerotic heart disease of native coronary artery without angina pectoris: Secondary | ICD-10-CM

## 2013-10-16 DIAGNOSIS — E119 Type 2 diabetes mellitus without complications: Secondary | ICD-10-CM | POA: Diagnosis not present

## 2013-10-16 DIAGNOSIS — I872 Venous insufficiency (chronic) (peripheral): Secondary | ICD-10-CM | POA: Diagnosis not present

## 2013-10-16 LAB — BASIC METABOLIC PANEL
CO2: 31 mEq/L (ref 19–32)
Calcium: 9 mg/dL (ref 8.4–10.5)
Chloride: 103 mEq/L (ref 96–112)
GFR: 37.98 mL/min — ABNORMAL LOW (ref >60.00)
Glucose, Bld: 105 mg/dL — ABNORMAL HIGH (ref 70–99)
Potassium: 2.8 mEq/L — CL (ref 3.5–5.1)
Sodium: 144 mEq/L (ref 135–145)

## 2013-10-16 MED ORDER — POTASSIUM CHLORIDE CRYS ER 20 MEQ PO TBCR: EXTENDED_RELEASE_TABLET | ORAL | Status: DC

## 2013-10-16 NOTE — Patient Instructions (Addendum)
Labs today:  BMET (LAB TODAY; BMET  PLEASE FOLLOW UP WITH DR. HOCHREIN 11/22/13 AS PLANNED  Your physician recommends that you continue on your current medications as directed. Please refer to the Current Medication list given to you today.

## 2013-10-16 NOTE — Telephone Encounter (Signed)
See message below °

## 2013-10-16 NOTE — Telephone Encounter (Signed)
s/w pt's wife about K+ 2.8. Per [employer] Neighbors. PA states looks like is taking K+ 20 meq TID; pt needs to take an extra 40 meq now, then start 40 meq TID. Wife states NO pt only taking K+ 20 BID. I then d/w PA and cb pt's wife with new instructions. see documentation for remainder of message.. New instructions given to pt's wife. Wife does states she already gave the 40 meq we just talked about before I called back with new instructions of TAKE ANOTHER 40 MEQ TONIGHT BEFORE BED WHICH WILL = A TOTAL DOSE FOR TODAY 80 MEQ; STARTING TOMORROW 10/17/13 PT WILL BE ON K+ 40 MEQ BID

## 2013-10-16 NOTE — Addendum Note (Signed)
Addended by: Tarri Fuller on: 10/16/2013 03:10 PM   Modules accepted: Orders

## 2013-10-16 NOTE — Progress Notes (Signed)
[address], [address] [place], [place]  [address] Phone: [phone] Fax:  [phone]  Date:  10/16/2013   ID:  Frank, Mclean 03-12-1941, MRN [id number]  PCP:  [name], MD  Cardiologist:  Dr. Rollene Rotunda     History of Present Illness: Frank Mclean is a 72 y.o. male who returns for follow up after a recent admission to the hospital.   He has a hx of chronic combined systolic and diastolic CHF, NICM, nonobstructive CAD, AFib, CAD, HTN, HL, PAD. LHC 4/12:  mLAD 25%, mDx 30%, AVCFX 40%, pOM 25%, dRCA 40%. Echocardiogram 05/2013: EF 25-30%, diffuse HK, restrictive physiology, trivial AI, mild MR, mod LAE, reduced RVSF, PASP 42.    Admitted 05/2013 for atrial fibrillation with RVR complicated by type II non-STEMI and a/c combined systolic and diastolic CHF. He was placed on amiodarone at that time with restoration of NSR.   He was admitted 9/25-9/30 for a/c renal failure.  He had recently been seen for increasing LE edema.  He was also going to the wound center due to bilateral LE ulcers and was wearing UNNA boots.  His diuretics were adjusted and he f/u in the office with complaints of increasing weakness and dyspnea.  He had worsening weakness in his L hand and a Head CT was neg for acute bleed.  Labs returned with worsening creatinine (2.6=>3.3).  He was felt to be dehydrated and he was gently hydrated with IVFs ("warm and dry") and supported with low dose dopamine.   Creatinine slowly improved and he was eventually d/c to SNF.    He is back home now.  He is doing well.  LE edema is stable.  He continues to have UNNA boots placed on LEs by [facility].  He is also participating in HHPT.  Denies CP.  Dyspnea is stable.  He is NYHA Class IIb-III.  No orthopnea, PND.  No syncope.    Labs (7/14):   Hgb 10.9 Labs (9/14):   K 3=>3.2, creatinine 2.5=>2.6=>3.54=>1.96, ALT 14, TSH 0.90, proBNP 131, Hgb 9.8 Labs (10/14):  K 3.4, creatinine 2.5   Wt Readings from Last 3 Encounters:    10/16/13 211 lb (95.709 kg)  10/09/13 211 lb (95.709 kg)  09/18/13 207 lb 14.3 oz (94.3 kg)     Past Medical History  Diagnosis Date  . Gout   . HTN (hypertension)   . Hyperlipidemia   . Osteoarthritis   . History of CVA (cerebrovascular accident)     Left pontine infarct July 2004; changed from Plavix to Coumadin in 2004 per MD at [facility]  . GERD (gastroesophageal reflux disease)   . DJD (degenerative joint disease)   . BPH (benign prostatic hypertrophy)   . DM (diabetes mellitus), type 2   . Peripheral vascular disease   . Bilateral leg ulcer     ACHILLES AREA--  NONHEALING  . CKD (chronic kidney disease) stage 3, GFR 30-59 ml/min   . CAD (coronary artery disease)     a. LHC 4/12: Mid LAD 25%, mid diagonal 30%, AV circumflex 40%, proximal OM 25%, distal RCA 40%  . Chronic combined systolic and diastolic heart failure     a. Echo 05/2013: EF 25-30%, diffuse HK, restrictive physiology, trivial AI, mild MR, moderate LAE, reduced RV systolic function, PASP 42  . LBBB (left bundle branch block)   . Atrial fibrillation     a. amiodarone Rx started 05/2013;  b. chronic coumadin  . NICM (nonischemic cardiomyopathy)  a. EF 25-30%.    Current Outpatient Prescriptions  Medication Sig Dispense Refill  . amiodarone (PACERONE) 200 MG tablet Take 1 tablet (200 mg total) by mouth daily.      [name] amLODipine (NORVASC) 10 MG tablet       . atorvastatin (LIPITOR) 20 MG tablet Take 20 mg by mouth at bedtime.      . budesonide-formoterol (SYMBICORT) 160-4.5 MCG/ACT inhaler Inhale 2 puffs into the lungs daily as needed (shortness of breath).      . cholecalciferol (VITAMIN D) 1000 UNITS tablet Take 1,000 Units by mouth daily.      . collagenase (SANTYL) ointment Apply 1 application topically 2 (two) times a week. On Monday and Friday at home (for wounds on legs) - applied Wednesday at the wound center      . cyanocobalamin 1000 MCG tablet Take 1 tablet (1,000 mcg total) by mouth daily.  100  tablet  3  . ferrous sulfate 325 (65 FE) MG tablet Take 1 tablet (325 mg total) by mouth daily.  30 tablet  6  . glipiZIDE (GLUCOTROL) 5 MG tablet Take 0.5 tablets (2.5 mg total) by mouth 2 (two) times daily before a meal.  30 tablet  3  . hydrALAZINE (APRESOLINE) 50 MG tablet Take 1 tablet (50 mg total) by mouth 3 (three) times daily.  90 tablet  0  . HYDROcodone-acetaminophen (NORCO/VICODIN) 5-325 MG per tablet Take 1 tablet by mouth 2 (two) times daily as needed for pain.      . isosorbide mononitrate (IMDUR) 60 MG 24 hr tablet Take 1 tablet (60 mg total) by mouth daily.  30 tablet  0  . labetalol (NORMODYNE) 200 MG tablet Take 1 tablet (200 mg total) by mouth 2 (two) times daily.  60 tablet  2  . nitroGLYCERIN (NITROSTAT) 0.4 MG SL tablet Place 1 tablet (0.4 mg total) under the tongue every 5 (five) minutes as needed for chest pain.  4 tablet  0  . potassium chloride SA (K-DUR,KLOR-CON) 20 MEQ tablet Take 20 mEq by mouth 3 (three) times daily.      [name] torsemide (DEMADEX) 20 MG tablet Take 2 tablets (40 mg total) by mouth daily.  60 tablet  11  . triamcinolone cream (KENALOG) 0.5 % Apply 1 application topically 2 (two) times daily as needed (for skin breakdown).  30 g  0  . warfarin (COUMADIN) 5 MG tablet Take 2.5-5 mg by mouth every evening. Take 1/2 tablet (2.5 mg) on Monday and Friday, take 1 tablet (5 mg) on all other days       No current facility-administered medications for this visit.    Allergies:   No Active Allergies  Social History:  The patient  reports that he quit smoking about 39 years ago. He has never used smokeless tobacco. He reports that he does not drink alcohol or use illicit drugs.    Family History:  [name] family history includes Diabetes in his mother; Gout in his other; Heart disease in his father and mother; Hypertension in his father and mother; Stroke in his other.   ROS:  Please see the history of present illness.   He has a chronic cough - no changes.    All other systems reviewed and negative.   PHYSICAL EXAM: VS:  BP 140/88  Pulse 60  Ht 6' 2.5" (1.892 m)  Wt 211 lb (95.709 kg)  BMI 26.74 kg/m2 Well nourished, well developed, in no acute distress HEENT: normal Neck: no  JVD at 90 Cardiac:  normal S1, S2; RRR; no murmur; no rub Lungs:  Clear to auscultation bilaterally, no wheezing, rhonchi or rales Abd: soft, nontender, no hepatomegaly Ext: trace bilateral LE edema; legs are wrapped in UNNA boots Skin: warm and dry Neuro:  CNs 2-12 intact, face is symmetrical  EKG:  NSR, HR 60, LBBB     ASSESSMENT AND PLAN:  1. Chronic Combined Systolic and Diastolic CHF:   he looks much better than I have seen him today.  he walked in with his walker and previously was in a wheelchair.  He appears to have more energy.  Volume is stable.  Continue current Rx. Check BMET today.  2. CAD:  No angina. He is not on aspirin as he is on Coumadin. Continue statin. 3. Atrial Fibrillation:  Maintaining NSR.  He remains on coumadin and Amiodarone.  LFTs and TSH ok in 08/2013.   4. Chronic Kidney Disease:   Stable.  Check BMET today. 5. Hypertension:  Controlled. 6. Hyperlipidemia:  Continue statin. 7. Lower Extremity Ulcers:  Continue f/u with wound care. 8. Disposition: Follow up with Dr. Rollene Rotunda in 11/2013 as planned.   Signed, Tereso Newcomer, PA-C  10/16/2013 10:08 AM

## 2013-10-17 DIAGNOSIS — L8993 Pressure ulcer of unspecified site, stage 3: Secondary | ICD-10-CM | POA: Diagnosis not present

## 2013-10-17 DIAGNOSIS — L89509 Pressure ulcer of unspecified ankle, unspecified stage: Secondary | ICD-10-CM | POA: Diagnosis not present

## 2013-10-17 LAB — GLUCOSE, CAPILLARY

## 2013-10-18 DIAGNOSIS — I10 Essential (primary) hypertension: Secondary | ICD-10-CM | POA: Diagnosis not present

## 2013-10-18 DIAGNOSIS — L97209 Non-pressure chronic ulcer of unspecified calf with unspecified severity: Secondary | ICD-10-CM | POA: Diagnosis not present

## 2013-10-18 DIAGNOSIS — I872 Venous insufficiency (chronic) (peripheral): Secondary | ICD-10-CM | POA: Diagnosis not present

## 2013-10-18 DIAGNOSIS — E119 Type 2 diabetes mellitus without complications: Secondary | ICD-10-CM | POA: Diagnosis not present

## 2013-10-18 DIAGNOSIS — I509 Heart failure, unspecified: Secondary | ICD-10-CM | POA: Diagnosis not present

## 2013-10-19 ENCOUNTER — Other Ambulatory Visit: Payer: Self-pay | Admitting: *Deleted

## 2013-10-19 ENCOUNTER — Other Ambulatory Visit (INDEPENDENT_AMBULATORY_CARE_PROVIDER_SITE_OTHER): Payer: Medicare Other

## 2013-10-19 ENCOUNTER — Telehealth: Payer: Self-pay | Admitting: *Deleted

## 2013-10-19 DIAGNOSIS — I5043 Acute on chronic combined systolic (congestive) and diastolic (congestive) heart failure: Secondary | ICD-10-CM

## 2013-10-19 DIAGNOSIS — I509 Heart failure, unspecified: Secondary | ICD-10-CM | POA: Diagnosis not present

## 2013-10-19 DIAGNOSIS — I10 Essential (primary) hypertension: Secondary | ICD-10-CM | POA: Diagnosis not present

## 2013-10-19 DIAGNOSIS — L97209 Non-pressure chronic ulcer of unspecified calf with unspecified severity: Secondary | ICD-10-CM | POA: Diagnosis not present

## 2013-10-19 DIAGNOSIS — I872 Venous insufficiency (chronic) (peripheral): Secondary | ICD-10-CM | POA: Diagnosis not present

## 2013-10-19 DIAGNOSIS — E119 Type 2 diabetes mellitus without complications: Secondary | ICD-10-CM | POA: Diagnosis not present

## 2013-10-19 LAB — BASIC METABOLIC PANEL
BUN: 20 mg/dL (ref 6–23)
CO2: 30 mEq/L (ref 19–32)
Calcium: 8.9 mg/dL (ref 8.4–10.5)
Chloride: 103 mEq/L (ref 96–112)
Creatinine, Ser: 2 mg/dL — ABNORMAL HIGH (ref 0.4–1.5)
GFR: 42.41 mL/min — ABNORMAL LOW (ref >60.00)
Sodium: 142 mEq/L (ref 135–145)

## 2013-10-19 MED ORDER — POTASSIUM CHLORIDE CRYS ER 20 MEQ PO TBCR: EXTENDED_RELEASE_TABLET | ORAL | Status: DC

## 2013-10-19 NOTE — Telephone Encounter (Signed)
pt notified about labs results from today; showed to Dr. Sw[place] (DOD) K+ 3.0. He advised to have pt increase K+ to 40 meq TID, bmet 10/26/13. Pt verbalized understanding. I did tell pt that if Mrs. Mcadams had any questions I w/be back Monday. Pt said ok

## 2013-10-22 ENCOUNTER — Telehealth: Payer: Self-pay | Admitting: Physician Assistant

## 2013-10-22 DIAGNOSIS — E119 Type 2 diabetes mellitus without complications: Secondary | ICD-10-CM | POA: Diagnosis not present

## 2013-10-22 DIAGNOSIS — L97209 Non-pressure chronic ulcer of unspecified calf with unspecified severity: Secondary | ICD-10-CM | POA: Diagnosis not present

## 2013-10-22 DIAGNOSIS — I509 Heart failure, unspecified: Secondary | ICD-10-CM | POA: Diagnosis not present

## 2013-10-22 DIAGNOSIS — I10 Essential (primary) hypertension: Secondary | ICD-10-CM | POA: Diagnosis not present

## 2013-10-22 DIAGNOSIS — I872 Venous insufficiency (chronic) (peripheral): Secondary | ICD-10-CM | POA: Diagnosis not present

## 2013-10-22 NOTE — Telephone Encounter (Signed)
cb pt's wife about increase on K+ 10/19/13 per Dr. Sw[place]iland (DOD). Increase K+ to 40 meq TID, bmet 10/26/13.. Mrs. Gittins verbalized Plan of Care.

## 2013-10-22 NOTE — Telephone Encounter (Signed)
New message  Patients wife would like a call b ack regarding lab work. Please call .

## 2013-10-24 ENCOUNTER — Encounter (HOSPITAL_BASED_OUTPATIENT_CLINIC_OR_DEPARTMENT_OTHER): Payer: Medicare Other | Attending: General Surgery

## 2013-10-24 DIAGNOSIS — L97309 Non-pressure chronic ulcer of unspecified ankle with unspecified severity: Secondary | ICD-10-CM | POA: Insufficient documentation

## 2013-10-24 DIAGNOSIS — E1169 Type 2 diabetes mellitus with other specified complication: Secondary | ICD-10-CM | POA: Insufficient documentation

## 2013-10-25 DIAGNOSIS — I872 Venous insufficiency (chronic) (peripheral): Secondary | ICD-10-CM | POA: Diagnosis not present

## 2013-10-25 DIAGNOSIS — I509 Heart failure, unspecified: Secondary | ICD-10-CM | POA: Diagnosis not present

## 2013-10-25 DIAGNOSIS — I10 Essential (primary) hypertension: Secondary | ICD-10-CM | POA: Diagnosis not present

## 2013-10-25 DIAGNOSIS — L97209 Non-pressure chronic ulcer of unspecified calf with unspecified severity: Secondary | ICD-10-CM | POA: Diagnosis not present

## 2013-10-25 DIAGNOSIS — E119 Type 2 diabetes mellitus without complications: Secondary | ICD-10-CM | POA: Diagnosis not present

## 2013-10-26 ENCOUNTER — Other Ambulatory Visit: Payer: Medicare Other

## 2013-10-26 DIAGNOSIS — L97209 Non-pressure chronic ulcer of unspecified calf with unspecified severity: Secondary | ICD-10-CM | POA: Diagnosis not present

## 2013-10-26 DIAGNOSIS — E119 Type 2 diabetes mellitus without complications: Secondary | ICD-10-CM | POA: Diagnosis not present

## 2013-10-26 DIAGNOSIS — I10 Essential (primary) hypertension: Secondary | ICD-10-CM | POA: Diagnosis not present

## 2013-10-26 DIAGNOSIS — I872 Venous insufficiency (chronic) (peripheral): Secondary | ICD-10-CM | POA: Diagnosis not present

## 2013-10-26 DIAGNOSIS — I509 Heart failure, unspecified: Secondary | ICD-10-CM | POA: Diagnosis not present

## 2013-10-28 NOTE — Progress Notes (Signed)
Patient ID: Frank Mclean, male   DOB: 06/26/1941, 72 y.o.   MRN: [id number]        PROGRESS NOTE  DATE: 10/04/2013  FACILITY:  [facility]  LEVEL OF CARE: SNF (31)  Acute Visit  CHIEF COMPLAINT:  Manage hypothyroidism, chronic kidney disease, and anemia of chronic kidney disease.    HISTORY OF PRESENT ILLNESS: I was requested by the staff to assess the patient regarding above problem(s):  HYPOTHYROIDISM:  New problem.  On 09/28/2013:  TSH 6.34.  Patient is on amiodarone.  Patient denies fatigue or constipation.    ANEMIA: The anemia is unstable. The patient denies fatigue, melena or hematochezia. No complications from the medications currently being used.  On 09/19/2013:  Hemoglobin 8.5, MCV 78.   In 08/2013:  Hemoglobin 9.8.    CHRONIC KIDNEY DISEASE: [name] chronic kidney disease remains stable.  Patient denies increasing lower extremity swelling or confusion. Last BUN and creatinine are:   On 09/24/2013:  BUN 20, creatinine 1.99.  In 08/2013:  BUN 25, creatinine 1.96.    PAST MEDICAL HISTORY : Reviewed.  No changes.  CURRENT MEDICATIONS: Reviewed per [facility]  REVIEW OF SYSTEMS:  GENERAL: no change in appetite, no fatigue, no weight changes, no fever, chills or weakness RESPIRATORY: no cough, SOB, DOE,, wheezing, hemoptysis CARDIAC: no chest pain or palpitations;  chronic lower extremity swelling   GI: no abdominal pain, diarrhea, constipation, heart burn, nausea or vomiting  PHYSICAL EXAMINATION  GENERAL: no acute distress, normal body habitus EYES: conjunctivae normal, sclerae normal, normal eye lids NECK: supple, trachea midline, no neck masses, no thyroid tenderness, no thyromegaly LYMPHATICS: no LAN in the neck, no supraclavicular LAN RESPIRATORY: breathing is even & unlabored, BS CTAB CARDIAC: RRR, no murmur,no extra heart sounds   EDEMA/VARICOSITIES:  +2 bilateral lower extremity edema  ARTERIAL:  pedal pulses nonpalpable   GI: abdomen soft,  normal BS, no masses, no tenderness, no hepatomegaly, no splenomegaly PSYCHIATRIC: the patient is alert & oriented to person, affect & behavior appropriate  LABS/RADIOLOGY: On 09/24/2013:  Sodium 150.    In 08/2013:  Sodium 146.    ASSESSMENT/PLAN:  Hypothyroidism.  New problem.  Likely due to amiodarone.  Recheck TSH in six weeks.    Anemia of chronic kidney disease.  Unstable problem.  Hemoglobin declined.  Continue iron.  Follow up with primary MD.    Chronic kidney disease.  Stable.    Hypernatremia.  Encourage increased fluid intake.    CPT CODE: 69629

## 2013-10-29 ENCOUNTER — Other Ambulatory Visit (INDEPENDENT_AMBULATORY_CARE_PROVIDER_SITE_OTHER): Payer: Medicare Other

## 2013-10-29 ENCOUNTER — Ambulatory Visit (INDEPENDENT_AMBULATORY_CARE_PROVIDER_SITE_OTHER): Payer: Medicare Other | Admitting: General Practice

## 2013-10-29 DIAGNOSIS — I6350 Cerebral infarction due to unspecified occlusion or stenosis of unspecified cerebral artery: Secondary | ICD-10-CM | POA: Diagnosis not present

## 2013-10-29 DIAGNOSIS — I509 Heart failure, unspecified: Secondary | ICD-10-CM | POA: Diagnosis not present

## 2013-10-29 DIAGNOSIS — E119 Type 2 diabetes mellitus without complications: Secondary | ICD-10-CM | POA: Diagnosis not present

## 2013-10-29 DIAGNOSIS — D631 Anemia in chronic kidney disease: Secondary | ICD-10-CM | POA: Insufficient documentation

## 2013-10-29 DIAGNOSIS — L97209 Non-pressure chronic ulcer of unspecified calf with unspecified severity: Secondary | ICD-10-CM | POA: Diagnosis not present

## 2013-10-29 DIAGNOSIS — I872 Venous insufficiency (chronic) (peripheral): Secondary | ICD-10-CM | POA: Diagnosis not present

## 2013-10-29 DIAGNOSIS — Z7901 Long term (current) use of anticoagulants: Secondary | ICD-10-CM | POA: Diagnosis not present

## 2013-10-29 DIAGNOSIS — I5043 Acute on chronic combined systolic (congestive) and diastolic (congestive) heart failure: Secondary | ICD-10-CM

## 2013-10-29 DIAGNOSIS — E870 Hyperosmolality and hypernatremia: Secondary | ICD-10-CM | POA: Insufficient documentation

## 2013-10-29 DIAGNOSIS — I10 Essential (primary) hypertension: Secondary | ICD-10-CM | POA: Diagnosis not present

## 2013-10-29 LAB — BASIC METABOLIC PANEL
BUN: 21 mg/dL (ref 6–23)
Calcium: 8.9 mg/dL (ref 8.4–10.5)
Chloride: 106 mEq/L (ref 96–112)
Creatinine, Ser: 2.4 mg/dL — ABNORMAL HIGH (ref 0.4–1.5)
GFR: 34.66 mL/min — ABNORMAL LOW (ref >60.00)
Glucose, Bld: 128 mg/dL — ABNORMAL HIGH (ref 70–99)
Potassium: 3.8 mEq/L (ref 3.5–5.1)
Sodium: 143 mEq/L (ref 135–145)

## 2013-10-29 LAB — POCT INR: INR: 2

## 2013-10-30 ENCOUNTER — Telehealth: Payer: Self-pay | Admitting: *Deleted

## 2013-10-30 DIAGNOSIS — I872 Venous insufficiency (chronic) (peripheral): Secondary | ICD-10-CM | POA: Diagnosis not present

## 2013-10-30 DIAGNOSIS — I10 Essential (primary) hypertension: Secondary | ICD-10-CM | POA: Diagnosis not present

## 2013-10-30 DIAGNOSIS — I509 Heart failure, unspecified: Secondary | ICD-10-CM | POA: Diagnosis not present

## 2013-10-30 DIAGNOSIS — E119 Type 2 diabetes mellitus without complications: Secondary | ICD-10-CM | POA: Diagnosis not present

## 2013-10-30 DIAGNOSIS — I5022 Chronic systolic (congestive) heart failure: Secondary | ICD-10-CM

## 2013-10-30 DIAGNOSIS — L97209 Non-pressure chronic ulcer of unspecified calf with unspecified severity: Secondary | ICD-10-CM | POA: Diagnosis not present

## 2013-10-30 NOTE — Telephone Encounter (Signed)
pt's wife notified about lab results and will get repeat bmet 11/09/13. Wife verbalized understanding to results.

## 2013-10-31 DIAGNOSIS — L97309 Non-pressure chronic ulcer of unspecified ankle with unspecified severity: Secondary | ICD-10-CM | POA: Diagnosis not present

## 2013-10-31 DIAGNOSIS — E1169 Type 2 diabetes mellitus with other specified complication: Secondary | ICD-10-CM | POA: Diagnosis not present

## 2013-10-31 LAB — GLUCOSE, CAPILLARY: Glucose-Capillary: 115 mg/dL — ABNORMAL HIGH (ref 70–99)

## 2013-11-02 DIAGNOSIS — I872 Venous insufficiency (chronic) (peripheral): Secondary | ICD-10-CM | POA: Diagnosis not present

## 2013-11-02 DIAGNOSIS — I509 Heart failure, unspecified: Secondary | ICD-10-CM | POA: Diagnosis not present

## 2013-11-02 DIAGNOSIS — L97209 Non-pressure chronic ulcer of unspecified calf with unspecified severity: Secondary | ICD-10-CM | POA: Diagnosis not present

## 2013-11-02 DIAGNOSIS — E119 Type 2 diabetes mellitus without complications: Secondary | ICD-10-CM | POA: Diagnosis not present

## 2013-11-02 DIAGNOSIS — I10 Essential (primary) hypertension: Secondary | ICD-10-CM | POA: Diagnosis not present

## 2013-11-05 DIAGNOSIS — I509 Heart failure, unspecified: Secondary | ICD-10-CM | POA: Diagnosis not present

## 2013-11-05 DIAGNOSIS — I10 Essential (primary) hypertension: Secondary | ICD-10-CM | POA: Diagnosis not present

## 2013-11-05 DIAGNOSIS — I872 Venous insufficiency (chronic) (peripheral): Secondary | ICD-10-CM | POA: Diagnosis not present

## 2013-11-05 DIAGNOSIS — E119 Type 2 diabetes mellitus without complications: Secondary | ICD-10-CM | POA: Diagnosis not present

## 2013-11-05 DIAGNOSIS — L97209 Non-pressure chronic ulcer of unspecified calf with unspecified severity: Secondary | ICD-10-CM | POA: Diagnosis not present

## 2013-11-06 DIAGNOSIS — I872 Venous insufficiency (chronic) (peripheral): Secondary | ICD-10-CM | POA: Diagnosis not present

## 2013-11-06 DIAGNOSIS — L97209 Non-pressure chronic ulcer of unspecified calf with unspecified severity: Secondary | ICD-10-CM | POA: Diagnosis not present

## 2013-11-06 DIAGNOSIS — I10 Essential (primary) hypertension: Secondary | ICD-10-CM | POA: Diagnosis not present

## 2013-11-06 DIAGNOSIS — I509 Heart failure, unspecified: Secondary | ICD-10-CM | POA: Diagnosis not present

## 2013-11-06 DIAGNOSIS — E119 Type 2 diabetes mellitus without complications: Secondary | ICD-10-CM | POA: Diagnosis not present

## 2013-11-07 ENCOUNTER — Other Ambulatory Visit: Payer: Self-pay | Admitting: Family Medicine

## 2013-11-07 DIAGNOSIS — I509 Heart failure, unspecified: Secondary | ICD-10-CM | POA: Diagnosis not present

## 2013-11-07 DIAGNOSIS — E1169 Type 2 diabetes mellitus with other specified complication: Secondary | ICD-10-CM | POA: Diagnosis not present

## 2013-11-07 DIAGNOSIS — I10 Essential (primary) hypertension: Secondary | ICD-10-CM | POA: Diagnosis not present

## 2013-11-07 DIAGNOSIS — L97809 Non-pressure chronic ulcer of other part of unspecified lower leg with unspecified severity: Secondary | ICD-10-CM | POA: Diagnosis not present

## 2013-11-07 DIAGNOSIS — I872 Venous insufficiency (chronic) (peripheral): Secondary | ICD-10-CM | POA: Diagnosis not present

## 2013-11-07 DIAGNOSIS — L97309 Non-pressure chronic ulcer of unspecified ankle with unspecified severity: Secondary | ICD-10-CM | POA: Diagnosis not present

## 2013-11-07 DIAGNOSIS — E119 Type 2 diabetes mellitus without complications: Secondary | ICD-10-CM | POA: Diagnosis not present

## 2013-11-08 DIAGNOSIS — E119 Type 2 diabetes mellitus without complications: Secondary | ICD-10-CM | POA: Diagnosis not present

## 2013-11-08 DIAGNOSIS — I509 Heart failure, unspecified: Secondary | ICD-10-CM | POA: Diagnosis not present

## 2013-11-08 DIAGNOSIS — I10 Essential (primary) hypertension: Secondary | ICD-10-CM | POA: Diagnosis not present

## 2013-11-08 DIAGNOSIS — L97809 Non-pressure chronic ulcer of other part of unspecified lower leg with unspecified severity: Secondary | ICD-10-CM | POA: Diagnosis not present

## 2013-11-08 DIAGNOSIS — I872 Venous insufficiency (chronic) (peripheral): Secondary | ICD-10-CM | POA: Diagnosis not present

## 2013-11-09 ENCOUNTER — Other Ambulatory Visit: Payer: Medicare Other

## 2013-11-09 DIAGNOSIS — I509 Heart failure, unspecified: Secondary | ICD-10-CM | POA: Diagnosis not present

## 2013-11-09 DIAGNOSIS — L97809 Non-pressure chronic ulcer of other part of unspecified lower leg with unspecified severity: Secondary | ICD-10-CM | POA: Diagnosis not present

## 2013-11-09 DIAGNOSIS — I872 Venous insufficiency (chronic) (peripheral): Secondary | ICD-10-CM | POA: Diagnosis not present

## 2013-11-09 DIAGNOSIS — E119 Type 2 diabetes mellitus without complications: Secondary | ICD-10-CM | POA: Diagnosis not present

## 2013-11-09 DIAGNOSIS — I10 Essential (primary) hypertension: Secondary | ICD-10-CM | POA: Diagnosis not present

## 2013-11-12 ENCOUNTER — Ambulatory Visit: Payer: Medicare Other | Admitting: Podiatry

## 2013-11-12 ENCOUNTER — Ambulatory Visit (INDEPENDENT_AMBULATORY_CARE_PROVIDER_SITE_OTHER): Payer: Medicare Other | Admitting: Podiatry

## 2013-11-12 ENCOUNTER — Encounter: Payer: Self-pay | Admitting: Podiatry

## 2013-11-12 ENCOUNTER — Other Ambulatory Visit: Payer: Self-pay | Admitting: Internal Medicine

## 2013-11-12 VITALS — BP 143/81 | HR 60 | Resp 18

## 2013-11-12 DIAGNOSIS — M79609 Pain in unspecified limb: Secondary | ICD-10-CM

## 2013-11-12 DIAGNOSIS — B351 Tinea unguium: Secondary | ICD-10-CM | POA: Diagnosis not present

## 2013-11-12 DIAGNOSIS — L97809 Non-pressure chronic ulcer of other part of unspecified lower leg with unspecified severity: Secondary | ICD-10-CM | POA: Diagnosis not present

## 2013-11-12 DIAGNOSIS — I509 Heart failure, unspecified: Secondary | ICD-10-CM | POA: Diagnosis not present

## 2013-11-12 DIAGNOSIS — E119 Type 2 diabetes mellitus without complications: Secondary | ICD-10-CM | POA: Diagnosis not present

## 2013-11-12 DIAGNOSIS — I872 Venous insufficiency (chronic) (peripheral): Secondary | ICD-10-CM | POA: Diagnosis not present

## 2013-11-12 DIAGNOSIS — I10 Essential (primary) hypertension: Secondary | ICD-10-CM | POA: Diagnosis not present

## 2013-11-12 NOTE — Progress Notes (Signed)
  Subjective:    Patient ID: Frank Mclean, male    DOB: 1941-03-27, 72 y.o.   MRN: [id number]  HPI  I JUST MY NAILS CUT    Review of Systems  Constitutional: Positive for activity change.  HENT: Negative.   Eyes: Negative.   Respiratory: Negative.   Cardiovascular: Positive for leg swelling.  Gastrointestinal: Negative.   Endocrine: Negative.   Genitourinary: Positive for urgency.  Musculoskeletal:       Joint pain   Allergic/Immunologic: Negative.   Neurological: Negative.   Hematological: Negative.   Psychiatric/Behavioral: Negative.        Objective:   Physical Exam Orientated x47 72 year old black male.  Vascular: The DP and PT pulses are trace palpable bilaterally.  Neurological: Knee and ankle reflexes weakly reactive bilaterally.  Dermatological: Bandages in the lower extremities to the MPJs are present bilaterally(please note patient is under care for skin lesions on his right and left lower extremities). All 10 toenails are hypertrophic, elongated, brown and tender to palpation.  Musculoskeletal: HAV deformities noted bilaterally.       Assessment & Plan:   Assessment: Symptomatic onychomycoses x10  Plan: All 10 toenails are debrided without any bleeding. Reappoint at three-month intervals.

## 2013-11-13 DIAGNOSIS — E119 Type 2 diabetes mellitus without complications: Secondary | ICD-10-CM | POA: Diagnosis not present

## 2013-11-13 DIAGNOSIS — I10 Essential (primary) hypertension: Secondary | ICD-10-CM | POA: Diagnosis not present

## 2013-11-13 DIAGNOSIS — I872 Venous insufficiency (chronic) (peripheral): Secondary | ICD-10-CM | POA: Diagnosis not present

## 2013-11-13 DIAGNOSIS — L97809 Non-pressure chronic ulcer of other part of unspecified lower leg with unspecified severity: Secondary | ICD-10-CM | POA: Diagnosis not present

## 2013-11-13 DIAGNOSIS — I509 Heart failure, unspecified: Secondary | ICD-10-CM | POA: Diagnosis not present

## 2013-11-14 DIAGNOSIS — E1169 Type 2 diabetes mellitus with other specified complication: Secondary | ICD-10-CM | POA: Diagnosis not present

## 2013-11-14 DIAGNOSIS — L97309 Non-pressure chronic ulcer of unspecified ankle with unspecified severity: Secondary | ICD-10-CM | POA: Diagnosis not present

## 2013-11-16 DIAGNOSIS — I509 Heart failure, unspecified: Secondary | ICD-10-CM | POA: Diagnosis not present

## 2013-11-16 DIAGNOSIS — I872 Venous insufficiency (chronic) (peripheral): Secondary | ICD-10-CM | POA: Diagnosis not present

## 2013-11-16 DIAGNOSIS — L97809 Non-pressure chronic ulcer of other part of unspecified lower leg with unspecified severity: Secondary | ICD-10-CM | POA: Diagnosis not present

## 2013-11-16 DIAGNOSIS — I10 Essential (primary) hypertension: Secondary | ICD-10-CM | POA: Diagnosis not present

## 2013-11-16 DIAGNOSIS — E119 Type 2 diabetes mellitus without complications: Secondary | ICD-10-CM | POA: Diagnosis not present

## 2013-11-19 DIAGNOSIS — I509 Heart failure, unspecified: Secondary | ICD-10-CM | POA: Diagnosis not present

## 2013-11-19 DIAGNOSIS — I10 Essential (primary) hypertension: Secondary | ICD-10-CM | POA: Diagnosis not present

## 2013-11-19 DIAGNOSIS — E119 Type 2 diabetes mellitus without complications: Secondary | ICD-10-CM | POA: Diagnosis not present

## 2013-11-19 DIAGNOSIS — L97809 Non-pressure chronic ulcer of other part of unspecified lower leg with unspecified severity: Secondary | ICD-10-CM | POA: Diagnosis not present

## 2013-11-19 DIAGNOSIS — I872 Venous insufficiency (chronic) (peripheral): Secondary | ICD-10-CM | POA: Diagnosis not present

## 2013-11-21 ENCOUNTER — Encounter (HOSPITAL_BASED_OUTPATIENT_CLINIC_OR_DEPARTMENT_OTHER): Payer: Medicare Other | Attending: General Surgery

## 2013-11-21 DIAGNOSIS — L97409 Non-pressure chronic ulcer of unspecified heel and midfoot with unspecified severity: Secondary | ICD-10-CM | POA: Insufficient documentation

## 2013-11-21 DIAGNOSIS — I509 Heart failure, unspecified: Secondary | ICD-10-CM | POA: Insufficient documentation

## 2013-11-21 DIAGNOSIS — Z9981 Dependence on supplemental oxygen: Secondary | ICD-10-CM | POA: Insufficient documentation

## 2013-11-21 DIAGNOSIS — E1169 Type 2 diabetes mellitus with other specified complication: Secondary | ICD-10-CM | POA: Insufficient documentation

## 2013-11-22 ENCOUNTER — Ambulatory Visit: Payer: Medicare Other | Admitting: Cardiology

## 2013-11-22 ENCOUNTER — Encounter: Payer: Self-pay | Admitting: Cardiology

## 2013-11-22 ENCOUNTER — Ambulatory Visit (INDEPENDENT_AMBULATORY_CARE_PROVIDER_SITE_OTHER): Payer: Medicare Other | Admitting: Cardiology

## 2013-11-22 VITALS — BP 138/92 | HR 60 | Ht 74.0 in | Wt 220.0 lb

## 2013-11-22 DIAGNOSIS — I5022 Chronic systolic (congestive) heart failure: Secondary | ICD-10-CM

## 2013-11-22 DIAGNOSIS — I509 Heart failure, unspecified: Secondary | ICD-10-CM | POA: Diagnosis not present

## 2013-11-22 DIAGNOSIS — I4891 Unspecified atrial fibrillation: Secondary | ICD-10-CM

## 2013-11-22 NOTE — Progress Notes (Signed)
HPI The patient presents for evaluation of heart failure, acute on chronic renal insufficiency and atrial fibrillation. He has venous ulcers. However, he and his wife report that these are healing. He is able to walk a little bit more. The swelling in his legs is less. However, his weight is up. He's had some more shortness of breath and coughing at night though there is not obvious PND or orthopnea. He is not weighing himself daily unfortunately. He is not when necessary dosing his diuretic as he has been instructed. He's not having any new chest pressure, neck or arm discomfort.   No Known Allergies  Current Outpatient Prescriptions  Medication Sig Dispense Refill  . amiodarone (PACERONE) 200 MG tablet Take 1 tablet (200 mg total) by mouth daily.      [name] amLODipine (NORVASC) 10 MG tablet       . atorvastatin (LIPITOR) 20 MG tablet Take 20 mg by mouth at bedtime.      . budesonide-formoterol (SYMBICORT) 160-4.5 MCG/ACT inhaler Inhale 2 puffs into the lungs daily as needed (shortness of breath).      . cholecalciferol (VITAMIN D) 1000 UNITS tablet Take 1,000 Units by mouth daily.      . collagenase (SANTYL) ointment Apply 1 application topically 2 (two) times a week. On Monday and Friday at home (for wounds on legs) - applied Wednesday at the wound center      . cyanocobalamin 1000 MCG tablet Take 1 tablet (1,000 mcg total) by mouth daily.  100 tablet  3  . ferrous sulfate 325 (65 FE) MG tablet Take 1 tablet (325 mg total) by mouth daily.  30 tablet  6  . glipiZIDE (GLUCOTROL) 5 MG tablet Take 0.5 tablets (2.5 mg total) by mouth 2 (two) times daily before a meal.  30 tablet  3  . hydrALAZINE (APRESOLINE) 50 MG tablet TAKE 1 TABLET (50 MG TOTAL) BY MOUTH 3 (THREE) TIMES DAILY.  90 tablet  5  . HYDROcodone-acetaminophen (NORCO/VICODIN) 5-325 MG per tablet Take 1 tablet by mouth 2 (two) times daily as needed for pain.      . isosorbide mononitrate (IMDUR) 60 MG 24 hr tablet Take 1 tablet (60 mg  total) by mouth daily.  30 tablet  0  . labetalol (NORMODYNE) 200 MG tablet Take 1 tablet (200 mg total) by mouth 2 (two) times daily.  60 tablet  2  . potassium chloride SA (K-DUR,KLOR-CON) 20 MEQ tablet Start 10/19/13 take 40 meq three times per Dr. Swaziland.      . torsemide (DEMADEX) 20 MG tablet Take 2 tablets (40 mg total) by mouth daily.  60 tablet  11  . triamcinolone cream (KENALOG) 0.5 % Apply 1 application topically 2 (two) times daily as needed (for skin breakdown).  30 g  0  . warfarin (COUMADIN) 5 MG tablet Take 2.5-5 mg by mouth every evening. Take 1/2 tablet (2.5 mg) on Monday and Friday, take 1 tablet (5 mg) on all other days       No current facility-administered medications for this visit.    Past Medical History  Diagnosis Date  . Gout   . HTN (hypertension)   . Hyperlipidemia   . Osteoarthritis   . History of CVA (cerebrovascular accident)     Left pontine infarct July 2004; changed from Plavix to Coumadin in 2004 per MD at [facility]  . GERD (gastroesophageal reflux disease)   . DJD (degenerative joint disease)   . BPH (benign prostatic hypertrophy)   .  DM (diabetes mellitus), type 2   . Peripheral vascular disease   . Bilateral leg ulcer     ACHILLES AREA--  NONHEALING  . CKD (chronic kidney disease) stage 3, GFR 30-59 ml/min   . CAD (coronary artery disease)     a. LHC 4/12: Mid LAD 25%, mid diagonal 30%, AV circumflex 40%, proximal OM 25%, distal RCA 40%  . Chronic combined systolic and diastolic heart failure     a. Echo 05/2013: EF 25-30%, diffuse HK, restrictive physiology, trivial AI, mild MR, moderate LAE, reduced RV systolic function, PASP 42  . LBBB (left bundle branch block)   . Atrial fibrillation     a. amiodarone Rx started 05/2013;  b. chronic coumadin  . NICM (nonischemic cardiomyopathy)     a. EF 25-30%.    Past Surgical History  Procedure Laterality Date  . Cardiac catheterization  04-16-2011   DR [facility]    NON-OBSTRUCTIVE CAD. MILDLY  ELEVATED PULMONARY PRESSURES/ ELEVATED  END-DIASTOLIC PRESSURE  . Transthoracic echocardiogram  12-31-2010    MODERATE CONCENTRIC LVH/ SYSTOLIC FUNCTION SEVERELY REDUCED/ EF 25-30%/  SEVERE HYPOKINESIS OF ANTEROSEPTAL MYOCARDIUM  AND ENTIREAPICAL MYOCARDIUM /  MODERATE HYPOKINESIS OF LATERAL, INFEROLATERAL, INFERIOR,AND INFEROSEPTAL MYOCARIUM/  GRADE 3 DIASTOLIC DYSFUNCTION/ MILD MR  . Aortogram w/ bilateral lower extremitiy runoff  05-18-2013  DR FIELDS    LEFT LEG OCCLUDED PERONEAL AND ANTERIOR TIBIAL ARTERIES/ HIGH GRADE STENOIS 80% MIDDLE AND DISTAL THIRD OF POSTERIOR TIBIAL ARTERY/ RIGHT PERONEAL AND ANTERIOR TIBIAL ARTERY OCCLUDED/ 40% STENOSIS DISTALLY     ROS:  As stated in the HPI and negative for all other systems.  PHYSICAL EXAM BP 138/92  Pulse 60  Ht 6\' 2"  (1.88 m)  Wt 220 lb (99.791 kg)  BMI 28.23 kg/m2 GENERAL:  Well appearing NECK:  6o JVD, waveform within normal limits, carotid upstroke brisk and symmetric, no bruits, no thyromegaly LUNGS:  Clear to auscultation bilaterally BACK:  No CVA tenderness CHEST:  Unremarkable HEART:  PMI not displaced or sustained,S1 and S2 within normal limits, no S3, no S4, no clicks, no rubs, no murmurs ABD:  Flat, positive bowel sounds normal in frequency in pitch, no bruits, no rebound, no guarding, no midline pulsatile mass, no hepatomegaly, no splenomegaly EXT:  2 plus pulses throughout, left greater than right mild to moderate edema, no cyanosis no clubbing, the legs are wrapped.     ASSESSMENT AND PLAN  CONGESTIVE HEART FAILURE -  His weight is up and he has some symptoms. He will take an extra one a milligrams of Demadex for the next 3 days. I will have him come back in a few days for his basic metabolic profile. The most important thing is that he needs to start weighing himself daily as he has been instructed many times.  HYPERTENSION -  The blood pressure is at target. No change in medications is indicated. We will continue with  therapeutic lifestyle changes (TLC).  PVD - He and is being followed by the wound clinic and apparently healing nicely.  No change in therapy is indicated.   ATRIAL FIBRILLATION:   The patient seems to be maintaining sinus rhythm. I will reduce the amiodarone to 200 mg daily.  He is up to date with lab work.

## 2013-11-22 NOTE — Patient Instructions (Signed)
Please take extra Torsemide 20 mg for the next 3 days. Continue all other medications as listed.  Please return early next week for lab work (BMP)  Follow up in 4 months with Dr Antoine Poche in 4 months.

## 2013-11-23 DIAGNOSIS — I509 Heart failure, unspecified: Secondary | ICD-10-CM | POA: Diagnosis not present

## 2013-11-23 DIAGNOSIS — I10 Essential (primary) hypertension: Secondary | ICD-10-CM | POA: Diagnosis not present

## 2013-11-23 DIAGNOSIS — I872 Venous insufficiency (chronic) (peripheral): Secondary | ICD-10-CM | POA: Diagnosis not present

## 2013-11-23 DIAGNOSIS — L97809 Non-pressure chronic ulcer of other part of unspecified lower leg with unspecified severity: Secondary | ICD-10-CM | POA: Diagnosis not present

## 2013-11-23 DIAGNOSIS — E119 Type 2 diabetes mellitus without complications: Secondary | ICD-10-CM | POA: Diagnosis not present

## 2013-11-25 ENCOUNTER — Encounter (HOSPITAL_COMMUNITY): Payer: Self-pay | Admitting: Emergency Medicine

## 2013-11-25 DIAGNOSIS — Z833 Family history of diabetes mellitus: Secondary | ICD-10-CM

## 2013-11-25 DIAGNOSIS — I739 Peripheral vascular disease, unspecified: Secondary | ICD-10-CM | POA: Diagnosis present

## 2013-11-25 DIAGNOSIS — Z823 Family history of stroke: Secondary | ICD-10-CM

## 2013-11-25 DIAGNOSIS — E785 Hyperlipidemia, unspecified: Secondary | ICD-10-CM | POA: Diagnosis present

## 2013-11-25 DIAGNOSIS — E119 Type 2 diabetes mellitus without complications: Secondary | ICD-10-CM | POA: Diagnosis present

## 2013-11-25 DIAGNOSIS — Z8249 Family history of ischemic heart disease and other diseases of the circulatory system: Secondary | ICD-10-CM | POA: Diagnosis not present

## 2013-11-25 DIAGNOSIS — Z7901 Long term (current) use of anticoagulants: Secondary | ICD-10-CM | POA: Diagnosis not present

## 2013-11-25 DIAGNOSIS — I872 Venous insufficiency (chronic) (peripheral): Secondary | ICD-10-CM | POA: Diagnosis present

## 2013-11-25 DIAGNOSIS — I2510 Atherosclerotic heart disease of native coronary artery without angina pectoris: Secondary | ICD-10-CM | POA: Diagnosis not present

## 2013-11-25 DIAGNOSIS — J189 Pneumonia, unspecified organism: Secondary | ICD-10-CM | POA: Diagnosis present

## 2013-11-25 DIAGNOSIS — R0609 Other forms of dyspnea: Secondary | ICD-10-CM | POA: Diagnosis not present

## 2013-11-25 DIAGNOSIS — Z8349 Family history of other endocrine, nutritional and metabolic diseases: Secondary | ICD-10-CM | POA: Diagnosis not present

## 2013-11-25 DIAGNOSIS — I4891 Unspecified atrial fibrillation: Secondary | ICD-10-CM | POA: Diagnosis not present

## 2013-11-25 DIAGNOSIS — I447 Left bundle-branch block, unspecified: Secondary | ICD-10-CM | POA: Diagnosis present

## 2013-11-25 DIAGNOSIS — J9620 Acute and chronic respiratory failure, unspecified whether with hypoxia or hypercapnia: Secondary | ICD-10-CM | POA: Diagnosis present

## 2013-11-25 DIAGNOSIS — N184 Chronic kidney disease, stage 4 (severe): Secondary | ICD-10-CM | POA: Diagnosis present

## 2013-11-25 DIAGNOSIS — Z8673 Personal history of transient ischemic attack (TIA), and cerebral infarction without residual deficits: Secondary | ICD-10-CM

## 2013-11-25 DIAGNOSIS — I509 Heart failure, unspecified: Secondary | ICD-10-CM | POA: Diagnosis present

## 2013-11-25 DIAGNOSIS — R0989 Other specified symptoms and signs involving the circulatory and respiratory systems: Secondary | ICD-10-CM | POA: Diagnosis not present

## 2013-11-25 DIAGNOSIS — I428 Other cardiomyopathies: Secondary | ICD-10-CM | POA: Diagnosis present

## 2013-11-25 DIAGNOSIS — K219 Gastro-esophageal reflux disease without esophagitis: Secondary | ICD-10-CM | POA: Diagnosis present

## 2013-11-25 DIAGNOSIS — N400 Enlarged prostate without lower urinary tract symptoms: Secondary | ICD-10-CM | POA: Diagnosis present

## 2013-11-25 DIAGNOSIS — L97909 Non-pressure chronic ulcer of unspecified part of unspecified lower leg with unspecified severity: Secondary | ICD-10-CM | POA: Diagnosis present

## 2013-11-25 DIAGNOSIS — I129 Hypertensive chronic kidney disease with stage 1 through stage 4 chronic kidney disease, or unspecified chronic kidney disease: Secondary | ICD-10-CM | POA: Diagnosis present

## 2013-11-25 DIAGNOSIS — I5043 Acute on chronic combined systolic (congestive) and diastolic (congestive) heart failure: Secondary | ICD-10-CM | POA: Diagnosis not present

## 2013-11-25 DIAGNOSIS — Z87891 Personal history of nicotine dependence: Secondary | ICD-10-CM | POA: Diagnosis not present

## 2013-11-25 DIAGNOSIS — M1990 Unspecified osteoarthritis, unspecified site: Secondary | ICD-10-CM | POA: Diagnosis present

## 2013-11-25 DIAGNOSIS — Z79899 Other long term (current) drug therapy: Secondary | ICD-10-CM

## 2013-11-25 DIAGNOSIS — E876 Hypokalemia: Secondary | ICD-10-CM | POA: Diagnosis present

## 2013-11-25 DIAGNOSIS — J90 Pleural effusion, not elsewhere classified: Secondary | ICD-10-CM | POA: Diagnosis not present

## 2013-11-25 DIAGNOSIS — R0602 Shortness of breath: Secondary | ICD-10-CM | POA: Diagnosis not present

## 2013-11-25 NOTE — ED Notes (Signed)
C/o sob since Saturday with nausea and vomiting (x1) today.  Reports dry cough at night for the past 2-3 days.  Seen by PCP on Friday and was told to increase lasix.  Took extra dose of Lasix Friday and Saturday but only 1 today. Pt denies pain.

## 2013-11-26 ENCOUNTER — Inpatient Hospital Stay (HOSPITAL_COMMUNITY)
Admission: EM | Admit: 2013-11-26 | Discharge: 2013-11-28 | DRG: 291 | Disposition: A | Discharge status: Home Health | Payer: Medicare Other | Attending: Internal Medicine | Admitting: Internal Medicine

## 2013-11-26 ENCOUNTER — Encounter (HOSPITAL_COMMUNITY): Payer: Self-pay | Admitting: Emergency Medicine

## 2013-11-26 ENCOUNTER — Emergency Department (HOSPITAL_COMMUNITY): Payer: Medicare Other | Attending: Emergency Medicine

## 2013-11-26 DIAGNOSIS — R0600 Dyspnea, unspecified: Secondary | ICD-10-CM

## 2013-11-26 DIAGNOSIS — I10 Essential (primary) hypertension: Secondary | ICD-10-CM

## 2013-11-26 DIAGNOSIS — I739 Peripheral vascular disease, unspecified: Secondary | ICD-10-CM

## 2013-11-26 DIAGNOSIS — R5381 Other malaise: Secondary | ICD-10-CM

## 2013-11-26 DIAGNOSIS — I5022 Chronic systolic (congestive) heart failure: Secondary | ICD-10-CM

## 2013-11-26 DIAGNOSIS — E870 Hyperosmolality and hypernatremia: Secondary | ICD-10-CM

## 2013-11-26 DIAGNOSIS — E039 Hypothyroidism, unspecified: Secondary | ICD-10-CM

## 2013-11-26 DIAGNOSIS — N058 Unspecified nephritic syndrome with other morphologic changes: Secondary | ICD-10-CM

## 2013-11-26 DIAGNOSIS — Z7901 Long term (current) use of anticoagulants: Secondary | ICD-10-CM

## 2013-11-26 DIAGNOSIS — D638 Anemia in other chronic diseases classified elsewhere: Secondary | ICD-10-CM

## 2013-11-26 DIAGNOSIS — I4891 Unspecified atrial fibrillation: Secondary | ICD-10-CM

## 2013-11-26 DIAGNOSIS — Z8679 Personal history of other diseases of the circulatory system: Secondary | ICD-10-CM

## 2013-11-26 DIAGNOSIS — J189 Pneumonia, unspecified organism: Secondary | ICD-10-CM

## 2013-11-26 DIAGNOSIS — R269 Unspecified abnormalities of gait and mobility: Secondary | ICD-10-CM

## 2013-11-26 DIAGNOSIS — I428 Other cardiomyopathies: Secondary | ICD-10-CM

## 2013-11-26 DIAGNOSIS — I70219 Atherosclerosis of native arteries of extremities with intermittent claudication, unspecified extremity: Secondary | ICD-10-CM

## 2013-11-26 DIAGNOSIS — M109 Gout, unspecified: Secondary | ICD-10-CM

## 2013-11-26 DIAGNOSIS — N400 Enlarged prostate without lower urinary tract symptoms: Secondary | ICD-10-CM

## 2013-11-26 DIAGNOSIS — R0609 Other forms of dyspnea: Secondary | ICD-10-CM

## 2013-11-26 DIAGNOSIS — I5043 Acute on chronic combined systolic (congestive) and diastolic (congestive) heart failure: Secondary | ICD-10-CM

## 2013-11-26 DIAGNOSIS — N179 Acute kidney failure, unspecified: Secondary | ICD-10-CM

## 2013-11-26 DIAGNOSIS — I2510 Atherosclerotic heart disease of native coronary artery without angina pectoris: Secondary | ICD-10-CM

## 2013-11-26 DIAGNOSIS — I70229 Atherosclerosis of native arteries of extremities with rest pain, unspecified extremity: Secondary | ICD-10-CM

## 2013-11-26 DIAGNOSIS — I447 Left bundle-branch block, unspecified: Secondary | ICD-10-CM

## 2013-11-26 DIAGNOSIS — IMO0001 Reserved for inherently not codable concepts without codable children: Secondary | ICD-10-CM

## 2013-11-26 DIAGNOSIS — D631 Anemia in chronic kidney disease: Secondary | ICD-10-CM

## 2013-11-26 DIAGNOSIS — I6350 Cerebral infarction due to unspecified occlusion or stenosis of unspecified cerebral artery: Secondary | ICD-10-CM

## 2013-11-26 DIAGNOSIS — E1129 Type 2 diabetes mellitus with other diabetic kidney complication: Secondary | ICD-10-CM

## 2013-11-26 DIAGNOSIS — R55 Syncope and collapse: Secondary | ICD-10-CM

## 2013-11-26 DIAGNOSIS — K219 Gastro-esophageal reflux disease without esophagitis: Secondary | ICD-10-CM

## 2013-11-26 DIAGNOSIS — Z01818 Encounter for other preprocedural examination: Secondary | ICD-10-CM

## 2013-11-26 DIAGNOSIS — I872 Venous insufficiency (chronic) (peripheral): Secondary | ICD-10-CM

## 2013-11-26 DIAGNOSIS — M25572 Pain in left ankle and joints of left foot: Secondary | ICD-10-CM

## 2013-11-26 DIAGNOSIS — E785 Hyperlipidemia, unspecified: Secondary | ICD-10-CM

## 2013-11-26 DIAGNOSIS — D509 Iron deficiency anemia, unspecified: Secondary | ICD-10-CM

## 2013-11-26 DIAGNOSIS — I509 Heart failure, unspecified: Secondary | ICD-10-CM

## 2013-11-26 DIAGNOSIS — E538 Deficiency of other specified B group vitamins: Secondary | ICD-10-CM

## 2013-11-26 DIAGNOSIS — E876 Hypokalemia: Secondary | ICD-10-CM

## 2013-11-26 DIAGNOSIS — I951 Orthostatic hypotension: Secondary | ICD-10-CM

## 2013-11-26 DIAGNOSIS — R413 Other amnesia: Secondary | ICD-10-CM

## 2013-11-26 DIAGNOSIS — M1990 Unspecified osteoarthritis, unspecified site: Secondary | ICD-10-CM

## 2013-11-26 LAB — CBC WITH DIFFERENTIAL/PLATELET
Basophils Absolute: 0 10*3/uL (ref 0.0–0.1)
Basophils Relative: 0 % (ref 0–1)
Eosinophils Absolute: 0.1 10*3/uL (ref 0.0–0.7)
Hemoglobin: 10 g/dL — ABNORMAL LOW (ref 13.0–17.0)
Lymphocytes Relative: 7 % — ABNORMAL LOW (ref 12–46)
MCH: 25.3 pg — ABNORMAL LOW (ref 26.0–34.0)
MCHC: 31.1 g/dL (ref 30.0–36.0)
Monocytes Relative: 7 % (ref 3–12)
Neutro Abs: 9.8 10*3/uL — ABNORMAL HIGH (ref 1.7–7.7)
Neutrophils Relative %: 85 % — ABNORMAL HIGH (ref 43–77)
Platelets: 265 10*3/uL (ref 150–400)
RBC: 3.96 MIL/uL — ABNORMAL LOW (ref 4.22–5.81)
RDW: 18.6 % — ABNORMAL HIGH (ref 11.5–15.5)
WBC: 11.5 10*3/uL — ABNORMAL HIGH (ref 4.0–10.5)

## 2013-11-26 LAB — GLUCOSE, CAPILLARY
Glucose-Capillary: 97 mg/dL (ref 70–99)
Glucose-Capillary: 118 mg/dL — ABNORMAL HIGH (ref 70–99)
Glucose-Capillary: 85 mg/dL (ref 70–99)

## 2013-11-26 LAB — COMPREHENSIVE METABOLIC PANEL
ALT: 12 U/L (ref 0–53)
AST: 14 U/L (ref 0–37)
Alkaline Phosphatase: 88 U/L (ref 39–117)
CO2: 28 mEq/L (ref 19–32)
Chloride: 106 mEq/L (ref 96–112)
GFR calc Af Amer: 37 mL/min — ABNORMAL LOW (ref >90)
GFR calc non Af Amer: 32 mL/min — ABNORMAL LOW (ref >90)
Glucose, Bld: 117 mg/dL — ABNORMAL HIGH (ref 70–99)
Sodium: 144 mEq/L (ref 135–145)
Total Bilirubin: 0.6 mg/dL (ref 0.3–1.2)

## 2013-11-26 LAB — PRO B NATRIURETIC PEPTIDE: Pro B Natriuretic peptide (BNP): 5744 pg/mL — ABNORMAL HIGH (ref 0–125)

## 2013-11-26 LAB — CBC
Hemoglobin: 10 g/dL — ABNORMAL LOW (ref 13.0–17.0)
MCH: 24.4 pg — ABNORMAL LOW (ref 26.0–34.0)
MCV: 80.2 fL (ref 78.0–100.0)
Platelets: 278 10*3/uL (ref 150–400)
RBC: 4.09 MIL/uL — ABNORMAL LOW (ref 4.22–5.81)
WBC: 11.4 10*3/uL — ABNORMAL HIGH (ref 4.0–10.5)

## 2013-11-26 LAB — BASIC METABOLIC PANEL
BUN: 20 mg/dL (ref 6–23)
CO2: 26 mEq/L (ref 19–32)
Calcium: 9 mg/dL (ref 8.4–10.5)
Chloride: 103 mEq/L (ref 96–112)
GFR calc non Af Amer: 32 mL/min — ABNORMAL LOW (ref >90)
Glucose, Bld: 124 mg/dL — ABNORMAL HIGH (ref 70–99)
Potassium: 3.1 mEq/L — ABNORMAL LOW (ref 3.5–5.1)
Sodium: 141 mEq/L (ref 135–145)

## 2013-11-26 LAB — HEMOGLOBIN A1C: Hgb A1c MFr Bld: 5.4 % (ref <5.7)

## 2013-11-26 LAB — PROTIME-INR: INR: 1.7 — ABNORMAL HIGH (ref 0.00–1.49)

## 2013-11-26 LAB — POCT I-STAT TROPONIN I: Troponin i, poc: 0.06 ng/mL (ref 0.00–0.08)

## 2013-11-26 MED ORDER — INSULIN ASPART 100 UNIT/ML ~~LOC~~ SOLN: 0.0000 [IU] | Freq: Every day | SUBCUTANEOUS | Status: DC

## 2013-11-26 MED ORDER — DEXTROSE 5 % IV SOLN
500.0000 mg | Freq: Once | INTRAVENOUS | Status: AC
  Administered 2013-11-26: 500 mg via INTRAVENOUS

## 2013-11-26 MED ORDER — ISOSORBIDE MONONITRATE ER 60 MG PO TB24
60.0000 mg | ORAL_TABLET | Freq: Every day | ORAL | Status: DC
Start: 2013-11-26 — End: 2013-11-28
  Administered 2013-11-26 – 2013-11-28 (×3): 60 mg via ORAL
  Filled 2013-11-26 (×3): qty 1

## 2013-11-26 MED ORDER — WARFARIN - PHARMACIST DOSING INPATIENT: Freq: Every day | Status: DC

## 2013-11-26 MED ORDER — FUROSEMIDE 40 MG PO TABS
40.0000 mg | ORAL_TABLET | Freq: Once | ORAL | Status: AC
  Administered 2013-11-27: 06:00:00 40 mg via ORAL
  Filled 2013-11-26: qty 1

## 2013-11-26 MED ORDER — POTASSIUM CHLORIDE CRYS ER 20 MEQ PO TBCR
20.0000 meq | EXTENDED_RELEASE_TABLET | Freq: Three times a day (TID) | ORAL | Status: DC
  Administered 2013-11-26: 20 meq via ORAL
  Filled 2013-11-26: qty 1

## 2013-11-26 MED ORDER — LABETALOL HCL 200 MG PO TABS
200.0000 mg | ORAL_TABLET | Freq: Two times a day (BID) | ORAL | Status: DC
  Administered 2013-11-26 – 2013-11-27 (×3): 200 mg via ORAL
  Filled 2013-11-26 (×4): qty 1

## 2013-11-26 MED ORDER — DEXTROSE 5 % IV SOLN
1.0000 g | INTRAVENOUS | Status: DC
  Administered 2013-11-27 – 2013-11-28 (×2): 1 g via INTRAVENOUS
  Filled 2013-11-26 (×2): qty 10

## 2013-11-26 MED ORDER — DEXTROSE 5 % IV SOLN
500.0000 mg | INTRAVENOUS | Status: DC
  Administered 2013-11-27: 06:00:00 500 mg via INTRAVENOUS
  Filled 2013-11-26: qty 500

## 2013-11-26 MED ORDER — HYDROCODONE-ACETAMINOPHEN 5-325 MG PO TABS
1.0000 | ORAL_TABLET | Freq: Two times a day (BID) | ORAL | Status: DC | PRN
  Administered 2013-11-26: 1 via ORAL
  Filled 2013-11-26: qty 1

## 2013-11-26 MED ORDER — HYDRALAZINE HCL 50 MG PO TABS
50.0000 mg | ORAL_TABLET | Freq: Three times a day (TID) | ORAL | Status: DC
Start: 2013-11-26 — End: 2013-11-28
  Administered 2013-11-26 – 2013-11-28 (×7): 50 mg via ORAL
  Filled 2013-11-26 (×9): qty 1

## 2013-11-26 MED ORDER — DEXTROSE 5 % IV SOLN
1.0000 g | Freq: Once | INTRAVENOUS | Status: AC
  Administered 2013-11-26: 1 g via INTRAVENOUS
  Filled 2013-11-26: qty 10

## 2013-11-26 MED ORDER — AMIODARONE HCL 200 MG PO TABS
200.0000 mg | ORAL_TABLET | Freq: Every day | ORAL | Status: DC
Start: 2013-11-26 — End: 2013-11-28
  Administered 2013-11-26 – 2013-11-28 (×3): 200 mg via ORAL
  Filled 2013-11-26 (×3): qty 1

## 2013-11-26 MED ORDER — WARFARIN SODIUM 5 MG PO TABS
5.0000 mg | ORAL_TABLET | Freq: Once | ORAL | Status: AC
  Administered 2013-11-26: 5 mg via ORAL
  Filled 2013-11-26: qty 1

## 2013-11-26 MED ORDER — INSULIN ASPART 100 UNIT/ML ~~LOC~~ SOLN
0.0000 [IU] | Freq: Three times a day (TID) | SUBCUTANEOUS | Status: DC
  Administered 2013-11-26 – 2013-11-27 (×2): 1 [IU] via SUBCUTANEOUS

## 2013-11-26 MED ORDER — AMLODIPINE BESYLATE 10 MG PO TABS
10.0000 mg | ORAL_TABLET | Freq: Every day | ORAL | Status: DC
  Administered 2013-11-26 – 2013-11-28 (×3): 10 mg via ORAL
  Filled 2013-11-26 (×3): qty 1

## 2013-11-26 MED ORDER — FUROSEMIDE 10 MG/ML IJ SOLN
40.0000 mg | Freq: Once | INTRAMUSCULAR | Status: AC
  Administered 2013-11-26: 40 mg via INTRAVENOUS
  Filled 2013-11-26: qty 4

## 2013-11-26 MED ORDER — GLIPIZIDE 2.5 MG HALF TABLET
2.5000 mg | ORAL_TABLET | Freq: Two times a day (BID) | ORAL | Status: DC
  Administered 2013-11-26 – 2013-11-28 (×4): 2.5 mg via ORAL
  Filled 2013-11-26 (×8): qty 1

## 2013-11-26 MED ORDER — BUDESONIDE-FORMOTEROL FUMARATE 160-4.5 MCG/ACT IN AERO: 2.0000 | INHALATION_SPRAY | Freq: Every day | RESPIRATORY_TRACT | Status: DC | PRN

## 2013-11-26 MED ORDER — ATORVASTATIN CALCIUM 20 MG PO TABS
20.0000 mg | ORAL_TABLET | Freq: Every day | ORAL | Status: DC
  Administered 2013-11-26 – 2013-11-27 (×2): 20 mg via ORAL
  Filled 2013-11-26 (×3): qty 1

## 2013-11-26 MED ORDER — POTASSIUM CHLORIDE CRYS ER 20 MEQ PO TBCR
40.0000 meq | EXTENDED_RELEASE_TABLET | Freq: Once | ORAL | Status: AC
  Administered 2013-11-26: 12:00:00 40 meq via ORAL
  Filled 2013-11-26: qty 2

## 2013-11-26 NOTE — ED Notes (Signed)
Pt reports he has not taken his nighttime dose of his BP medication. Pt concerned about his pressure.

## 2013-11-26 NOTE — Progress Notes (Signed)
Orthopedic Tech Progress Note Patient Details:  Frank Mclean [birth year] [id number]  Ortho Devices Type of Ortho Device: Roland Rack boot Ortho Device/Splint Location: RLE Ortho Device/Splint Interventions: Ordered;Application   [name] 11/26/2013, 4:46 PM

## 2013-11-26 NOTE — Progress Notes (Signed)
Patient's heart rate sustaining in upper 50s, patient resting comfortably.  MD aware.  Will continue to monitor.

## 2013-11-26 NOTE — Progress Notes (Signed)
[facility]                                                                                Patient Demographics  Frank Mclean, is a 72 y.o. male, DOB - Feb 11, 1941, RUE:[id number]  Admit date - 11/26/2013   Admitting Physician Lynden Oxford, MD  Outpatient Primary MD for the patient is [name], MD  LOS - 0   Chief Complaint  Patient presents with  . Shortness of Breath        Assessment & Plan    1.Acute Resp Failure due to combination of Acute on chronic heart failure long with CAP. -  Doing fairly well with IV Lasix for diuresis, oxygen nebulizer treatment. Continue present antibiotics for pneumonia. We'll continue fluid and salt restriction and gentle diuresis the    2. Acute on chronic combined diastolic and systolic heart failure. Last known EF is around 25%. He is getting close to compensated, continue fluids salt restriction and gentle diuresis. Once stable outpatient followup with PCP and primary cardiologist continue on hydralazine, Imdur, beta blocker combination. 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    3. CK D. stage IV. Baseline creatinine around 2. He's currently on baseline monitor closely.    4. Diabetes mellitus type 2. Continue home regimen with addition of sliding scale insulin.  Lab Results  Component Value Date   HGBA1C 6.3 07/05/2013    CBG (last 3)   Recent Labs  11/26/13 0721 11/26/13 1125  GLUCAP 134* 97     5. Chronic atrial fibrillation. Continue combination of amiodarone along with beta blocker and Coumadin. Pharmacy monitoring Coumadin adjusting INR.      6. Hypertension currently on a combination of Norvasc, hydralazine, Imdur, beta blocker will monitor blood pressures closely and adjust as needed.       Code Status: Full  Family Communication:    Disposition Plan: Home   Procedures     Consults     Medications  Scheduled Meds: . amiodarone  200 mg Oral Daily  .  amLODipine  10 mg Oral Daily  . atorvastatin  20 mg Oral QHS  . [START ON 11/27/2013] azithromycin  500 mg Intravenous Q24H  . [START ON 11/27/2013] cefTRIAXone (ROCEPHIN)  IV  1 g Intravenous Q24H  . [START ON 11/27/2013] furosemide  40 mg Oral Once  . glipiZIDE  2.5 mg Oral BID AC  . hydrALAZINE  50 mg Oral TID  . insulin aspart  0-5 Units Subcutaneous QHS  . insulin aspart  0-9 Units Subcutaneous TID WC  . isosorbide mononitrate  60 mg Oral Daily  . labetalol  200 mg Oral BID  . potassium chloride SA  40 mEq Oral Once  . warfarin  5 mg Oral ONCE-1800   Continuous Infusions:  PRN Meds:.budesonide-formoterol, HYDROcodone-acetaminophen  DVT Prophylaxis  Coumadin  Lab Results  Component Value Date   INR 1.70* 11/26/2013   INR 2.0 10/29/2013   INR 2.5 10/12/2013   PROTIME 15.7 05/28/2009     Lab Results  Component Value Date   PLT 265 11/26/2013    Antibiotics     Anti-infectives   Start  Dose/Rate Route Frequency Ordered Stop   11/27/13 0600  azithromycin (ZITHROMAX) 500 mg in dextrose 5 % 250 mL IVPB     500 mg 250 mL/hr over 60 Minutes Intravenous Every 24 hours 11/26/13 0650 12/04/13 0559   11/27/13 0500  cefTRIAXone (ROCEPHIN) 1 g in dextrose 5 % 50 mL IVPB     1 g 100 mL/hr over 30 Minutes Intravenous Every 24 hours 11/26/13 0650 12/04/13 0459   11/26/13 0530  cefTRIAXone (ROCEPHIN) 1 g in dextrose 5 % 50 mL IVPB     1 g 100 mL/hr over 30 Minutes Intravenous  Once 11/26/13 0529 11/26/13 0617   11/26/13 0530  azithromycin (ZITHROMAX) 500 mg in dextrose 5 % 250 mL IVPB     500 mg 250 mL/hr over 60 Minutes Intravenous  Once 11/26/13 0529 11/26/13 1610          Subjective:   Frank Mclean today has, No headache, No chest pain, No abdominal pain - No Nausea, No new weakness tingling or numbness, No Cough - SOB.    Objective:   Filed Vitals:   11/26/13 0623 11/26/13 0707 11/26/13 1000 11/26/13 1022  BP:  156/94 143/88 143/88  Pulse:  119 57 57  Temp: 97.7 F  (36.5 C) 97.4 F (36.3 C)    TempSrc: Oral Oral    Resp:  20    Height:  6\' 2"  (1.88 m)    Weight:  98.2 kg (216 lb 7.9 oz)    SpO2:  100%      Wt Readings from Last 3 Encounters:  11/26/13 98.2 kg (216 lb 7.9 oz)  11/22/13 99.791 kg (220 lb)  10/16/13 95.709 kg (211 lb)     Intake/Output Summary (Last 24 hours) at 11/26/13 1212 Last data filed at 11/26/13 1158  Gross per 24 hour  Intake    290 ml  Output    200 ml  Net     90 ml    Exam Awake Alert, Oriented X 3, No new F.N deficits, Normal affect [place].AT,PERRAL Supple Neck,No JVD, No cervical lymphadenopathy appriciated.  Symmetrical Chest wall movement, Good air movement bilaterally, CTAB RRR,No Gallops,Rubs or new Murmurs, No Parasternal Heave +ve B.Sounds, Abd Soft, Non tender, No organomegaly appriciated, No rebound - guarding or rigidity. No Cyanosis, Clubbing or edema, No new Rash or bruise      Data Review   Micro Results No results found for this or any previous visit (from the past 240 hour(s)).  Radiology Reports Dg Chest 2 View  11/26/2013   CLINICAL DATA:  Shortness of breath and cough.  EXAM: CHEST  2 VIEW  COMPARISON:  Chest radiograph performed 09/06/2013  FINDINGS: The lungs are well-aerated. Left midlung airspace opacification is most compatible with pneumonia. Mild hazy right-sided and left basilar airspace opacities are also seen. Small bilateral pleural effusions are suspected. No pneumothorax is seen.  The heart is mildly enlarged. No acute osseous abnormalities are seen.  IMPRESSION: 1. Left midlung airspace opacification is most compatible with pneumonia; additional mild hazy right-sided and left basilar airspace opacities seen. Pulmonary edema is considered less likely. 2. Small bilateral pleural effusions suspected, with mild cardiomegaly.   Electronically Signed   By: Roanna Raider M.D.   On: 11/26/2013 00:25    CBC  Recent Labs Lab 11/26/13 0005 11/26/13 0825  WBC 11.4* 11.5*  HGB 10.0*  10.0*  HCT 32.8* 32.2*  PLT 278 265  MCV 80.2 81.3  MCH 24.4* 25.3*  MCHC 30.5 31.1  RDW 18.3* 18.6*  LYMPHSABS  --  0.9  MONOABS  --  0.8  EOSABS  --  0.1  BASOSABS  --  0.0    Chemistries   Recent Labs Lab 11/26/13 0005 11/26/13 0825  NA 141 144  K 3.1* 3.1*  CL 103 106  CO2 26 28  GLUCOSE 124* 117*  BUN 20 19  CREATININE 1.94* 1.97*  CALCIUM 9.0 9.1  AST  --  14  ALT  --  12  ALKPHOS  --  88  BILITOT  --  0.6   ------------------------------------------------------------------------------------------------------------------ estimated creatinine clearance is 38.2 ml/min (by C-G formula based on Cr of 1.97). ------------------------------------------------------------------------------------------------------------------ No results found for this basename: HGBA1C,  in the last 72 hours ------------------------------------------------------------------------------------------------------------------ No results found for this basename: CHOL, HDL, LDLCALC, TRIG, CHOLHDL, LDLDIRECT,  in the last 72 hours ------------------------------------------------------------------------------------------------------------------ No results found for this basename: TSH, T4TOTAL, FREET3, T3FREE, THYROIDAB,  in the last 72 hours ------------------------------------------------------------------------------------------------------------------ No results found for this basename: VITAMINB12, FOLATE, FERRITIN, TIBC, IRON, RETICCTPCT,  in the last 72 hours  Coagulation profile  Recent Labs Lab 11/26/13 0420  INR 1.70*    No results found for this basename: DDIMER,  in the last 72 hours  Cardiac Enzymes No results found for this basename: CK, CKMB, TROPONINI, MYOGLOBIN,  in the last 168 hours ------------------------------------------------------------------------------------------------------------------ No components found with this basename: POCBNP,      Time Spent in minutes   35   Jakeia Carreras K M.D on 11/26/2013 at 12:12 PM  Between 7am to 7pm - Pager - [phone]  After 7pm go to [email or web address] - password [email or web address]  And look for the night coverage person covering for me after hours  [facility] Office  [phone]

## 2013-11-26 NOTE — Progress Notes (Signed)
ANTICOAGULATION CONSULT NOTE - Initial Consult  Pharmacy Consult for warfarin Indication: atrial fibrillation  No Known Allergies  Patient Measurements: Height: 6\' 2"  (188 cm) Weight: 216 lb 7.9 oz (98.2 kg) IBW/kg (Calculated) : 82.2  Vital Signs: Temp: 97.4 F (36.3 C) (12/08 0707) Temp src: Oral (12/08 0707) BP: 143/88 mmHg (12/08 1022) Pulse Rate: 57 (12/08 1022)  Labs:  Recent Labs  11/26/13 0005 11/26/13 0420 11/26/13 0825  HGB 10.0*  --  10.0*  HCT 32.8*  --  32.2*  PLT 278  --  265  LABPROT  --  19.5*  --   INR  --  1.70*  --   CREATININE 1.94*  --  1.97*    Estimated Creatinine Clearance: 38.2 ml/min (by C-G formula based on Cr of 1.97).   Medical History: Past Medical History  Diagnosis Date  . Gout   . HTN (hypertension)   . Hyperlipidemia   . Osteoarthritis   . History of CVA (cerebrovascular accident)     Left pontine infarct July 2004; changed from Plavix to Coumadin in 2004 per MD at [facility]  . GERD (gastroesophageal reflux disease)   . DJD (degenerative joint disease)   . BPH (benign prostatic hypertrophy)   . DM (diabetes mellitus), type 2   . Peripheral vascular disease   . Bilateral leg ulcer     ACHILLES AREA--  NONHEALING  . CKD (chronic kidney disease) stage 3, GFR 30-59 ml/min   . CAD (coronary artery disease)     a. LHC 4/12: Mid LAD 25%, mid diagonal 30%, AV circumflex 40%, proximal OM 25%, distal RCA 40%  . Chronic combined systolic and diastolic heart failure     a. Echo 05/2013: EF 25-30%, diffuse HK, restrictive physiology, trivial AI, mild MR, moderate LAE, reduced RV systolic function, PASP 42  . LBBB (left bundle branch block)   . Atrial fibrillation     a. amiodarone Rx started 05/2013;  b. chronic coumadin  . NICM (nonischemic cardiomyopathy)     a. EF 25-30%.    Medications:  Prescriptions prior to admission  Medication Sig Dispense Refill  . amiodarone (PACERONE) 200 MG tablet Take 1 tablet (200 mg total) by mouth  daily.      [name] amLODipine (NORVASC) 10 MG tablet Take 10 mg by mouth daily.      [name] atorvastatin (LIPITOR) 20 MG tablet Take 20 mg by mouth at bedtime.      . budesonide-formoterol (SYMBICORT) 160-4.5 MCG/ACT inhaler Inhale 2 puffs into the lungs daily as needed (shortness of breath).      . cholecalciferol (VITAMIN D) 1000 UNITS tablet Take 1,000 Units by mouth daily.      . collagenase (SANTYL) ointment Apply 1 application topically 2 (two) times a week. On Monday and Friday at home (for wounds on legs) - applied Wednesday at the wound center      . cyanocobalamin 1000 MCG tablet Take 1 tablet (1,000 mcg total) by mouth daily.  100 tablet  3  . ferrous sulfate 325 (65 FE) MG tablet Take 1 tablet (325 mg total) by mouth daily.  30 tablet  6  . glipiZIDE (GLUCOTROL) 5 MG tablet Take 0.5 tablets (2.5 mg total) by mouth 2 (two) times daily before a meal.  30 tablet  3  . hydrALAZINE (APRESOLINE) 50 MG tablet Take 50 mg by mouth 3 (three) times daily.      [name] HYDROcodone-acetaminophen (NORCO/VICODIN) 5-325 MG per tablet Take 1 tablet by mouth 2 (two)  times daily as needed for pain.      . isosorbide mononitrate (IMDUR) 60 MG 24 hr tablet Take 1 tablet (60 mg total) by mouth daily.  30 tablet  0  . labetalol (NORMODYNE) 200 MG tablet Take 1 tablet (200 mg total) by mouth 2 (two) times daily.  60 tablet  2  . potassium chloride SA (K-DUR,KLOR-CON) 20 MEQ tablet Take 20 mEq by mouth 3 (three) times daily.      [name] torsemide (DEMADEX) 20 MG tablet Take 20 mg by mouth 2 (two) times daily.      [name] triamcinolone cream (KENALOG) 0.5 % Apply 1 application topically 2 (two) times daily as needed (for skin breakdown).  30 g  0  . warfarin (COUMADIN) 5 MG tablet Take 2.5-5 mg by mouth See admin instructions. Takes 0.5 tablet (2.5mg ) on Monday and Friday. Takes 1 tablet (5mg ) all other days.        Assessment: Pt has a h/o of acute on chronic heart failure, afib, renal insuff., htn, pvd, dm2, cva (2004). CHADS2=5. Pt  was on warfarin PTA, his INR today is subtherapeutic at 1.7. Pt was on amiodarone PTA, azithromycin was started this morning on 12/8.  Since pt is subtherapeutic, will begin pt on warfarin 5 mg then will likely resume home regimen.  Hgb, plts stable, pt showing no signs or symptoms of bleeding.  Goal of Therapy:  INR 2-3 Monitor platelets by anticoagulation protocol: Yes   Plan: Warfarin 5 mg po x1 Daily INR  Agapito Games, PharmD, BCPS Clinical Pharmacist 11/26/2013 10:43 AM

## 2013-11-26 NOTE — ED Notes (Signed)
PT given warm blankets, pillow. Pt's wife given ginger ale.

## 2013-11-26 NOTE — Evaluation (Signed)
Physical Therapy Evaluation Patient Details Name: Frank Mclean MRN: [id number] DOB: July 07, 1941 Today's Date: 11/26/2013 Time: 4098-1191 PT Time Calculation (min): 31 min  PT Assessment / Plan / Recommendation History of Present Illness  72 y.o. male admitted to [facility] on 11/26/13 with SOB.  Past medical history of hypertension, heart failure, CVA on Coumadin, GERD, diabetes mellitus, CKD.  Dx with acute on chrionic heart failure and possible PNA.   Clinical Impression  Pt is walking with a mild limp, but otherwise ok.  He is not using an assistive device and just finished with HHPT last week.  He and his wife would like for him to return home at discharge and I think this is appropriate.  His O2 sats did drop with gait on RA despite no reports of being SOB (see sats below).   PT to follow acutely for deficits listed below.     PT Assessment  Patient needs continued PT services    Follow Up Recommendations  No PT follow up;Supervision - Intermittent    Does the patient have the potential to tolerate intense rehabilitation     NA  Barriers to Discharge   None      Equipment Recommendations  None recommended by PT    Recommendations for Other Services   NA  Frequency Min 3X/week    Precautions / Restrictions Precautions Precautions: Other (comment) Precaution Comments: monitor O2 sats with mobility on RA.     Pertinent Vitals/Pain 90-91% at rest on RA, 85 % with longer distance gait on RA, improved to 87-88% with standing rest break and pursed lip breathing.  Returned to 91% on 1.5 L O2 [place] and seated rest break.        Mobility  Bed Mobility Bed Mobility: Not assessed (pt seated EOB eating dinner) Transfers Transfers: Sit to Stand;Stand to Sit Sit to Stand: 5: Supervision;With upper extremity assist;With armrests;From bed Stand to Sit: 5: Supervision;With upper extremity assist;With armrests;To chair/3-in-1 Details for Transfer Assistance: supervision for  safety Ambulation/Gait Ambulation/Gait Assistance: 4: Min guard Ambulation Distance (Feet): 300 Feet Assistive device: 1 person hand held assist Ambulation/Gait Assistance Details: min guard assist due to antalgic gait pattern.  3 standing rest breaks to check O2 sats.  O2 sats stable first rest break 90-91% on RA, second rest break 85% on RA.  Pt has no reports of SOB during gait, wife reports he wouldn't complain of it if he did feel SOB.   Gait Pattern: Step-through pattern;Antalgic (favoring right leg. ) Gait velocity: decreased General Gait Details: Tested O2 sats on RA.   Re-applied 1.5 L O2 [place] after treatment session.         PT Diagnosis: Abnormality of gait;Generalized weakness  PT Problem List: Decreased activity tolerance;Decreased mobility;Decreased balance;Cardiopulmonary status limiting activity PT Treatment Interventions: DME instruction;Gait training;Stair training;Functional mobility training;Therapeutic activities;Balance training;Therapeutic exercise;Neuromuscular re-education;Patient/family education     PT Goals(Current goals can be found in the care plan section) Acute Rehab PT Goals Patient Stated Goal: to go home, not be SOB PT Goal Formulation: With patient Time For Goal Achievement: 12/10/13 Potential to Achieve Goals: Good  Visit Information  Last PT Received On: 11/26/13 Assistance Needed: +1 History of Present Illness: 72 y.o. male admitted to [facility] on 11/26/13 with SOB.  Past medical history of hypertension, heart failure, CVA on Coumadin, GERD, diabetes mellitus, CKD.  Dx with acute on chrionic heart failure and possible PNA.        Prior Functioning  Home Living Family/patient expects to  be discharged to:: Private residence Living Arrangements: Spouse/significant other Available Help at Discharge: Family Type of Home: House Home Access: Stairs to enter Secretary/administrator of Steps: 5 Entrance Stairs-Rails: Right Home Layout: Laundry or work area  in basement Home Equipment: Environmental consultant - 2 wheels;Cane - single point;Shower seat Prior Function Level of Independence: Needs assistance Gait / Transfers Assistance Needed: none needed ADL's / Homemaking Assistance Needed: assist with his legs/wounds Communication / Swallowing Assistance Needed: none Comments: per wife just finished HHPT last week.  HHPT was provided by [facility].   Communication Communication: No difficulties Dominant Hand: Right    Cognition  Cognition Arousal/Alertness: Awake/alert Behavior During Therapy: WFL for tasks assessed/performed Overall Cognitive Status: Within Functional Limits for tasks assessed    Extremity/Trunk Assessment Upper Extremity Assessment Upper Extremity Assessment: Overall WFL for tasks assessed Lower Extremity Assessment Lower Extremity Assessment: RLE deficits/detail;LLE deficits/detail RLE Deficits / Details: both legs wrapped due to wounds.  Pt limp with gait seems as though right leg hurts more than left leg.   LLE Deficits / Details: both legs wrapped due to wounds.  Pt limp with gait seems as though right leg hurts more than left leg.   Cervical / Trunk Assessment Cervical / Trunk Assessment: Normal      End of Session PT - End of Session Activity Tolerance: Patient limited by fatigue Patient left: in chair;with call bell/phone within reach;with family/visitor present    Lurena Joiner B. Tom Macpherson, PT, DPT [phone]   11/26/2013, 5:45 PM

## 2013-11-26 NOTE — ED Notes (Signed)
Admitting MD at [facility].  

## 2013-11-26 NOTE — ED Notes (Signed)
Phlebotomy at [facility]

## 2013-11-26 NOTE — ED Provider Notes (Signed)
CSN: [id number]     Arrival date & time 11/25/13  2337 History   First MD Initiated Contact with Patient 11/26/13 0357     Chief Complaint  Patient presents with  . Shortness of Breath   (Consider location/radiation/quality/duration/timing/severity/associated sxs/prior Treatment) HPI This is a 72 year old male with a three-day history of shortness of breath. He saw his cardiologist 3 days ago who recommended he be given Lasix 20 mg twice a day for 3 days. His wife has given him this with the exception of yesterday evening this dose. He became so short of breath yesterday evening that he could barely walk across the room. He has had no chest pain with this. He has chronic lower extremity edema and wounds which are wrapped. On arrival he was notably dyspneic per nursing staff and he was placed on oxygen with improvement in his dyspnea. He has had cough but no fever. He did vomit one time yesterday.  Past Medical History  Diagnosis Date  . Gout   . HTN (hypertension)   . Hyperlipidemia   . Osteoarthritis   . History of CVA (cerebrovascular accident)     Left pontine infarct July 2004; changed from Plavix to Coumadin in 2004 per MD at [facility]  . GERD (gastroesophageal reflux disease)   . DJD (degenerative joint disease)   . BPH (benign prostatic hypertrophy)   . DM (diabetes mellitus), type 2   . Peripheral vascular disease   . Bilateral leg ulcer     ACHILLES AREA--  NONHEALING  . CKD (chronic kidney disease) stage 3, GFR 30-59 ml/min   . CAD (coronary artery disease)     a. LHC 4/12: Mid LAD 25%, mid diagonal 30%, AV circumflex 40%, proximal OM 25%, distal RCA 40%  . Chronic combined systolic and diastolic heart failure     a. Echo 05/2013: EF 25-30%, diffuse HK, restrictive physiology, trivial AI, mild MR, moderate LAE, reduced RV systolic function, PASP 42  . LBBB (left bundle branch block)   . Atrial fibrillation     a. amiodarone Rx started 05/2013;  b. chronic coumadin  . NICM  (nonischemic cardiomyopathy)     a. EF 25-30%.   Past Surgical History  Procedure Laterality Date  . Cardiac catheterization  04-16-2011   [facility]    NON-OBSTRUCTIVE CAD. MILDLY ELEVATED PULMONARY PRESSURES/ ELEVATED  END-DIASTOLIC PRESSURE  . Transthoracic echocardiogram  12-31-2010    MODERATE CONCENTRIC LVH/ SYSTOLIC FUNCTION SEVERELY REDUCED/ EF 25-30%/  SEVERE HYPOKINESIS OF ANTEROSEPTAL MYOCARDIUM  AND ENTIREAPICAL MYOCARDIUM /  MODERATE HYPOKINESIS OF LATERAL, INFEROLATERAL, INFERIOR,AND INFEROSEPTAL MYOCARIUM/  GRADE 3 DIASTOLIC DYSFUNCTION/ MILD MR  . Aortogram w/ bilateral lower extremitiy runoff  05-18-2013  DR FIELDS    LEFT LEG OCCLUDED PERONEAL AND ANTERIOR TIBIAL ARTERIES/ HIGH GRADE STENOIS 80% MIDDLE AND DISTAL THIRD OF POSTERIOR TIBIAL ARTERY/ RIGHT PERONEAL AND ANTERIOR TIBIAL ARTERY OCCLUDED/ 40% STENOSIS DISTALLY    Family History  Problem Relation Age of Onset  . Hypertension Mother   . Heart disease Mother   . Diabetes Mother   . Gout Other   . Stroke Other   . Hypertension Father   . Heart disease Father    History  Substance Use Topics  . Smoking status: Former Smoker    Quit date: 04/12/1974  . Smokeless tobacco: Never Used  . Alcohol Use: No     Comment: previously drank - "love cognac" quit 15 yrs ago.    Review of Systems  All other systems reviewed and  are negative.    Allergies  Review of patient's allergies indicates no known allergies.  Home Medications   Current Outpatient Rx  Name  Route  Sig  Dispense  Refill  . amiodarone (PACERONE) 200 MG tablet   Oral   Take 1 tablet (200 mg total) by mouth daily.         [name] amLODipine (NORVASC) 10 MG tablet   Oral   Take 10 mg by mouth daily.         [name] atorvastatin (LIPITOR) 20 MG tablet   Oral   Take 20 mg by mouth at bedtime.         . budesonide-formoterol (SYMBICORT) 160-4.5 MCG/ACT inhaler   Inhalation   Inhale 2 puffs into the lungs daily as needed (shortness of breath).          . cholecalciferol (VITAMIN D) 1000 UNITS tablet   Oral   Take 1,000 Units by mouth daily.         . collagenase (SANTYL) ointment   Topical   Apply 1 application topically 2 (two) times a week. On Monday and Friday at home (for wounds on legs) - applied Wednesday at the wound center         . cyanocobalamin 1000 MCG tablet   Oral   Take 1 tablet (1,000 mcg total) by mouth daily.   100 tablet   3   . ferrous sulfate 325 (65 FE) MG tablet   Oral   Take 1 tablet (325 mg total) by mouth daily.   30 tablet   6   . glipiZIDE (GLUCOTROL) 5 MG tablet   Oral   Take 0.5 tablets (2.5 mg total) by mouth 2 (two) times daily before a meal.   30 tablet   3   . hydrALAZINE (APRESOLINE) 50 MG tablet   Oral   Take 50 mg by mouth 3 (three) times daily.         [name] HYDROcodone-acetaminophen (NORCO/VICODIN) 5-325 MG per tablet   Oral   Take 1 tablet by mouth 2 (two) times daily as needed for pain.         . isosorbide mononitrate (IMDUR) 60 MG 24 hr tablet   Oral   Take 1 tablet (60 mg total) by mouth daily.   30 tablet   0   . labetalol (NORMODYNE) 200 MG tablet   Oral   Take 1 tablet (200 mg total) by mouth 2 (two) times daily.   60 tablet   2   . potassium chloride SA (K-DUR,KLOR-CON) 20 MEQ tablet   Oral   Take 20 mEq by mouth 3 (three) times daily.         [name] torsemide (DEMADEX) 20 MG tablet   Oral   Take 20 mg by mouth 2 (two) times daily.         [name] triamcinolone cream (KENALOG) 0.5 %   Topical   Apply 1 application topically 2 (two) times daily as needed (for skin breakdown).   30 g   0   . warfarin (COUMADIN) 5 MG tablet   Oral   Take 2.5-5 mg by mouth See admin instructions. Takes 0.5 tablet (2.5mg ) on Monday and Friday. Takes 1 tablet (5mg ) all other days.          BP 174/95  Pulse 58  Temp(Src) 98.1 F (36.7 C) (Oral)  Resp 20  SpO2 97%  Physical Exam General: Well-developed, well-nourished male in no acute distress; appearance  consistent with  age of record HENT: normocephalic; atraumatic Eyes: pupils equal, round and reactive to light; extraocular muscles intact Neck: supple Heart: regular rate and rhythm; 2/6 systolic murmur heard at apex Lungs: A few basilar rales Abdomen: soft; nondistended; nontender; bowel sounds present Extremities: Lower extremities edematous with pressure wraps in place Neurologic: Awake, alert; motor function intact in all extremities and symmetric; no facial droop Skin: Warm and dry Psychiatric: Flat affect    ED Course  Procedures (including critical care time)   MDM   Nursing notes and vitals signs, including pulse oximetry, reviewed.  Summary of this visit's results, reviewed by myself:  Labs:  Results for orders placed during the hospital encounter of 11/26/13 (from the past 24 hour(s))  CBC     Status: Abnormal   Collection Time    11/26/13 12:05 AM      Result Value Range   WBC 11.4 (*) 4.0 - 10.5 K/uL   RBC 4.09 (*) 4.22 - 5.81 MIL/uL   Hemoglobin 10.0 (*) 13.0 - 17.0 g/dL   HCT 16.1 (*) 09.6 - 04.5 %   MCV 80.2  78.0 - 100.0 fL   MCH 24.4 (*) 26.0 - 34.0 pg   MCHC 30.5  30.0 - 36.0 g/dL   RDW 40.9 (*) 81.1 - 91.4 %   Platelets 278  150 - 400 K/uL  BASIC METABOLIC PANEL     Status: Abnormal   Collection Time    11/26/13 12:05 AM      Result Value Range   Sodium 141  135 - 145 mEq/L   Potassium 3.1 (*) 3.5 - 5.1 mEq/L   Chloride 103  96 - 112 mEq/L   CO2 26  19 - 32 mEq/L   Glucose, Bld 124 (*) 70 - 99 mg/dL   BUN 20  6 - 23 mg/dL   Creatinine, Ser 7.82 (*) 0.50 - 1.35 mg/dL   Calcium 9.0  8.4 - 95.6 mg/dL   GFR calc non Af Amer 32 (*) >90 mL/min   GFR calc Af Amer 37 (*) >90 mL/min  PRO B NATRIURETIC PEPTIDE     Status: Abnormal   Collection Time    11/26/13 12:05 AM      Result Value Range   Pro B Natriuretic peptide (BNP) 5744.0 (*) 0 - 125 pg/mL  POCT I-STAT TROPONIN I     Status: None   Collection Time    11/26/13 12:21 AM      Result Value  Range   Troponin i, poc 0.06  0.00 - 0.08 ng/mL   Comment 3             Imaging Studies: Dg Chest 2 View  11/26/2013   CLINICAL DATA:  Shortness of breath and cough.  EXAM: CHEST  2 VIEW  COMPARISON:  Chest radiograph performed 09/06/2013  FINDINGS: The lungs are well-aerated. Left midlung airspace opacification is most compatible with pneumonia. Mild hazy right-sided and left basilar airspace opacities are also seen. Small bilateral pleural effusions are suspected. No pneumothorax is seen.  The heart is mildly enlarged. No acute osseous abnormalities are seen.  IMPRESSION: 1. Left midlung airspace opacification is most compatible with pneumonia; additional mild hazy right-sided and left basilar airspace opacities seen. Pulmonary edema is considered less likely. 2. Small bilateral pleural effusions suspected, with mild cardiomegaly.   Electronically Signed   By: Roanna Raider M.D.   On: 11/26/2013 00:25   5:28 AM No diuresis despite 40 mg of Lasix IV. We'll start antibiotics  for possible pneumonia.      Hanley Seamen, MD 11/26/13 [phone]

## 2013-11-26 NOTE — ED Notes (Signed)
PT states he is feeling increased SOB. Pt's 02 sats reading 89% on RA. Pt placed on 2L 02. Pt's 02 sats reading 98% on 2L 02.

## 2013-11-26 NOTE — H&P (Signed)
[facility] History and Physical  Patient: Frank Mclean  ZOX:[id number]  DOB: 11/04/1941  DOS: the patient was seen and examined on 11/26/2013 PCP: [name], MD  Chief Complaint: Cough and shortness of breath  HPI: Frank Mclean is a 72 y.o. male with Past medical history of hypertension, heart failure, CVA on Coumadin, GERD, diabetes mellitus, CKD. The patient is coming from home. The patient presented today with the complaints of shortness of breath. Symptoms have been ongoing since Friday. He saw his cardiologist on Friday were recommended him to take extra dose of torsemide 20 mg twice a day period which she has been taking since then. He continues to have dyspnea on exertion and orthopnea associated with PND when he wakes up to about in the morning coughing and gasping for air. He also felt palpitation which is not feeling right now and denies any chest pain or chest pressure or dizziness. He had episode of nausea and vomiting today denies any abdominal pain or diarrhea or rectal bleeding burning urination. He mentions he is urinating okay but it has slowed down. He has cough without any expectoration that has been ongoing since last 3-4 days he also complains of runny nose and sore throat. He mentions significant improvement in his symptoms after being on oxygen.  Review of Systems: as mentioned in the history of present illness.  A Comprehensive review of the other systems is negative.  Past Medical History  Diagnosis Date  . Gout   . HTN (hypertension)   . Hyperlipidemia   . Osteoarthritis   . History of CVA (cerebrovascular accident)     Left pontine infarct July 2004; changed from Plavix to Coumadin in 2004 per MD at [facility]  . GERD (gastroesophageal reflux disease)   . DJD (degenerative joint disease)   . BPH (benign prostatic hypertrophy)   . DM (diabetes mellitus), type 2   . Peripheral vascular disease   . Bilateral leg ulcer     ACHILLES AREA--  NONHEALING   . CKD (chronic kidney disease) stage 3, GFR 30-59 ml/min   . CAD (coronary artery disease)     a. LHC 4/12: Mid LAD 25%, mid diagonal 30%, AV circumflex 40%, proximal OM 25%, distal RCA 40%  . Chronic combined systolic and diastolic heart failure     a. Echo 05/2013: EF 25-30%, diffuse HK, restrictive physiology, trivial AI, mild MR, moderate LAE, reduced RV systolic function, PASP 42  . LBBB (left bundle branch block)   . Atrial fibrillation     a. amiodarone Rx started 05/2013;  b. chronic coumadin  . NICM (nonischemic cardiomyopathy)     a. EF 25-30%.   Past Surgical History  Procedure Laterality Date  . Cardiac catheterization  04-16-2011   DR [facility]    NON-OBSTRUCTIVE CAD. MILDLY ELEVATED PULMONARY PRESSURES/ ELEVATED  END-DIASTOLIC PRESSURE  . Transthoracic echocardiogram  12-31-2010    MODERATE CONCENTRIC LVH/ SYSTOLIC FUNCTION SEVERELY REDUCED/ EF 25-30%/  SEVERE HYPOKINESIS OF ANTEROSEPTAL MYOCARDIUM  AND ENTIREAPICAL MYOCARDIUM /  MODERATE HYPOKINESIS OF LATERAL, INFEROLATERAL, INFERIOR,AND INFEROSEPTAL MYOCARIUM/  GRADE 3 DIASTOLIC DYSFUNCTION/ MILD MR  . Aortogram w/ bilateral lower extremitiy runoff  05-18-2013  DR FIELDS    LEFT LEG OCCLUDED PERONEAL AND ANTERIOR TIBIAL ARTERIES/ HIGH GRADE STENOIS 80% MIDDLE AND DISTAL THIRD OF POSTERIOR TIBIAL ARTERY/ RIGHT PERONEAL AND ANTERIOR TIBIAL ARTERY OCCLUDED/ 40% STENOSIS DISTALLY    Social History:  reports that he quit smoking about 39 years ago. He has never used smokeless tobacco.  He reports that he does not drink alcohol or use illicit drugs. Independent for most of his  ADL.  No Known Allergies  Family History  Problem Relation Age of Onset  . Hypertension Mother   . Heart disease Mother   . Diabetes Mother   . Gout Other   . Stroke Other   . Hypertension Father   . Heart disease Father     Prior to Admission medications   Medication Sig Start Date End Date Taking? Authorizing Provider  amiodarone (PACERONE) 200  MG tablet Take 1 tablet (200 mg total) by mouth daily. 09/13/13  Yes Scott T Alben Spittle, PA-C  amLODipine (NORVASC) 10 MG tablet Take 10 mg by mouth daily.   Yes Historical Provider, MD  atorvastatin (LIPITOR) 20 MG tablet Take 20 mg by mouth at bedtime.   Yes Historical Provider, MD  budesonide-formoterol (SYMBICORT) 160-4.5 MCG/ACT inhaler Inhale 2 puffs into the lungs daily as needed (shortness of breath).   Yes Historical Provider, MD  cholecalciferol (VITAMIN D) 1000 UNITS tablet Take 1,000 Units by mouth daily.   Yes Historical Provider, MD  collagenase (SANTYL) ointment Apply 1 application topically 2 (two) times a week. On Monday and Friday at home (for wounds on legs) - applied Wednesday at the wound center   Yes Historical Provider, MD  cyanocobalamin 1000 MCG tablet Take 1 tablet (1,000 mcg total) by mouth daily. 06/29/13  Yes Lonia Skinner, MD  ferrous sulfate 325 (65 FE) MG tablet Take 1 tablet (325 mg total) by mouth daily. 07/05/13  Yes Georgina Quint Plotnikov, MD  glipiZIDE (GLUCOTROL) 5 MG tablet Take 0.5 tablets (2.5 mg total) by mouth 2 (two) times daily before a meal. 06/29/13  Yes Lonia Skinner, MD  hydrALAZINE (APRESOLINE) 50 MG tablet Take 50 mg by mouth 3 (three) times daily.   Yes Historical Provider, MD  HYDROcodone-acetaminophen (NORCO/VICODIN) 5-325 MG per tablet Take 1 tablet by mouth 2 (two) times daily as needed for pain.   Yes Historical Provider, MD  isosorbide mononitrate (IMDUR) 60 MG 24 hr tablet Take 1 tablet (60 mg total) by mouth daily. 06/29/13  Yes Lonia Skinner, MD  labetalol (NORMODYNE) 200 MG tablet Take 1 tablet (200 mg total) by mouth 2 (two) times daily. 06/29/13  Yes Lonia Skinner, MD  potassium chloride SA (K-DUR,KLOR-CON) 20 MEQ tablet Take 20 mEq by mouth 3 (three) times daily.   Yes Historical Provider, MD  torsemide (DEMADEX) 20 MG tablet Take 20 mg by mouth 2 (two) times daily.   Yes Historical Provider, MD  triamcinolone cream (KENALOG) 0.5 % Apply  1 application topically 2 (two) times daily as needed (for skin breakdown). 06/29/13  Yes Lonia Skinner, MD  warfarin (COUMADIN) 5 MG tablet Take 2.5-5 mg by mouth See admin instructions. Takes 0.5 tablet (2.5mg ) on Monday and Friday. Takes 1 tablet (5mg ) all other days.   Yes Historical Provider, MD    Physical Exam: Filed Vitals:   11/26/13 0530 11/26/13 0550 11/26/13 0600 11/26/13 0623  BP: 169/97 173/98 170/96   Pulse: 58 60 56   Temp:    97.7 F (36.5 C)  TempSrc:    Oral  Resp: 20 20 18    SpO2: 96% 97% 99%     General: Alert, Awake and Oriented to Time, Place and Person. Appear in mild distress Eyes: PERRL ENT: Oral Mucosa clear moist. Neck: No JVD Cardiovascular: S1 and S2 Present, no Murmur, Peripheral Pulses Present Respiratory: Bilateral Air entry equal  and Decreased, basal Crackles, no wheezes Abdomen: Bowel Sound Present, Soft and Non tender Skin: No Rash Extremities: Trace Pedal edema, no calf tenderness Neurologic: Grossly Unremarkable.  Labs on Admission:  CBC:  Recent Labs Lab 11/26/13 0005  WBC 11.4*  HGB 10.0*  HCT 32.8*  MCV 80.2  PLT 278    CMP     Component Value Date/Time   NA 141 11/26/2013 0005   K 3.1* 11/26/2013 0005   CL 103 11/26/2013 0005   CO2 26 11/26/2013 0005   GLUCOSE 124* 11/26/2013 0005   BUN 20 11/26/2013 0005   CREATININE 1.94* 11/26/2013 0005   CALCIUM 9.0 11/26/2013 0005   PROT 6.7 09/13/2013 1908   ALBUMIN 3.5 09/13/2013 1908   AST 15 09/13/2013 1908   ALT 15 09/13/2013 1908   ALKPHOS 90 09/13/2013 1908   BILITOT 0.2* 09/13/2013 1908   GFRNONAA 32* 11/26/2013 0005   GFRAA 37* 11/26/2013 0005    No results found for this basename: LIPASE, AMYLASE,  in the last 168 hours No results found for this basename: AMMONIA,  in the last 168 hours  No results found for this basename: CKTOTAL, CKMB, CKMBINDEX, TROPONINI,  in the last 168 hours BNP (last 3 results)  Recent Labs  06/09/13 2103 09/06/13 1625 11/26/13 0005  PROBNP  5523.0* 131.0* 5744.0*    Radiological Exams on Admission: Dg Chest 2 View  11/26/2013   CLINICAL DATA:  Shortness of breath and cough.  EXAM: CHEST  2 VIEW  COMPARISON:  Chest radiograph performed 09/06/2013  FINDINGS: The lungs are well-aerated. Left midlung airspace opacification is most compatible with pneumonia. Mild hazy right-sided and left basilar airspace opacities are also seen. Small bilateral pleural effusions are suspected. No pneumothorax is seen.  The heart is mildly enlarged. No acute osseous abnormalities are seen.  IMPRESSION: 1. Left midlung airspace opacification is most compatible with pneumonia; additional mild hazy right-sided and left basilar airspace opacities seen. Pulmonary edema is considered less likely. 2. Small bilateral pleural effusions suspected, with mild cardiomegaly.   Electronically Signed   By: Roanna Raider M.D.   On: 11/26/2013 00:25    EKG: Independently reviewed. Sinus bradycardia with old left bundle branch block  Assessment/Plan Principal Problem:   Acute on chronic heart failure Active Problems:   DM (diabetes mellitus), type 2 with renal complications   HYPERTENSION   CVA   CKD (chronic kidney disease) stage 3, GFR 30-59 ml/min   Dyspnea   1. Acute on chronic heart failure The patient is presenting with complaints of dyspnea on exertion that has been progressively worsening and associated with PND. He has an elevated proBNP of 5000 with this diagnosis more likely in favor of acute on chronic heart failure which he has history of. Patient has received one dose of IV Lasix 40 mg in the ED and I would continue to monitor his response was also placed him on his regular home medications. Strict I.'s and O.'s and daily weights His recent echocardiogram was done and 6/14 which showed EF of 25-30% and diastolic dysfunction  2. Cough and runny nose Patient has mild leukocytosis and his chest x-ray is suggestive of possible pneumonia due to atypical  pulmonary edema pattern. Therefore I would also cover him with IV ceftriaxone and azithromycin for community-acquired pneumonia I would obtain blood culture as well as sputum culture along with urine antigens and influenza antigen.  3. Diabetes mellitus Continue metformin and glipizide as well as place the patient on sliding scale  4. Chronic kidney disease Serum creatinine at baseline continue to monitor serum BMP and avoid nephrotoxic medications  5. 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  DVT Prophylaxis: On Coumadin mechanical compression device Nutrition: Diabetic and cardiac diet  Code Status: Full  Family Communication: Wife was present at bedside, opportunity was given to ask question and all questions were answered satisfactorily at the time of interview. Disposition: Admitted to inpatient in telemetry unit.  Author: Lynden Oxford, MD [facility] Pager: [phone] 11/26/2013, 6:28 AM    If 7PM-7AM, please contact night-coverage [email or web address] Password [email or web address]

## 2013-11-26 NOTE — Progress Notes (Signed)
Patient evaluated for community based chronic disease management services with [facility] Care Management Program as a benefit of patient's [employer]. Spoke with patient at bedside to explain [facility] Care Management services.  Patient has deferred to his wife [facility] engagement consent.  Left contact information and [facility] literature at bedside with patient and requested that he have his wife to call.  Made Inpatient Case Manager aware that [facility] Care Management following. Of note, [facility] Care Management services does not replace or interfere with any services that are arranged by inpatient case management or social work.  For additional questions or referrals please contact Anibal Henderson BSN RN [facility] Liaison at [phone].

## 2013-11-26 NOTE — ED Notes (Signed)
MD at BS

## 2013-11-26 NOTE — ED Notes (Signed)
Charge RN at BS.  

## 2013-11-27 ENCOUNTER — Inpatient Hospital Stay (HOSPITAL_COMMUNITY): Payer: Medicare Other

## 2013-11-27 ENCOUNTER — Other Ambulatory Visit: Payer: Medicare Other

## 2013-11-27 ENCOUNTER — Encounter (HOSPITAL_COMMUNITY): Payer: Self-pay | Admitting: Physician Assistant

## 2013-11-27 DIAGNOSIS — I5043 Acute on chronic combined systolic (congestive) and diastolic (congestive) heart failure: Secondary | ICD-10-CM

## 2013-11-27 DIAGNOSIS — I2510 Atherosclerotic heart disease of native coronary artery without angina pectoris: Secondary | ICD-10-CM

## 2013-11-27 DIAGNOSIS — I4891 Unspecified atrial fibrillation: Secondary | ICD-10-CM

## 2013-11-27 LAB — GLUCOSE, CAPILLARY: Glucose-Capillary: 98 mg/dL (ref 70–99)

## 2013-11-27 LAB — PROTIME-INR: INR: 1.76 — ABNORMAL HIGH (ref 0.00–1.49)

## 2013-11-27 LAB — STREP PNEUMONIAE URINARY ANTIGEN: Strep Pneumo Urinary Antigen: NEGATIVE

## 2013-11-27 MED ORDER — AZITHROMYCIN 500 MG PO TABS
500.0000 mg | ORAL_TABLET | Freq: Every day | ORAL | Status: DC
  Administered 2013-11-28: 500 mg via ORAL
  Filled 2013-11-27 (×2): qty 1

## 2013-11-27 MED ORDER — CARVEDILOL 6.25 MG PO TABS
6.2500 mg | ORAL_TABLET | Freq: Two times a day (BID) | ORAL | Status: DC
  Administered 2013-11-28: 06:00:00 6.25 mg via ORAL
  Filled 2013-11-27 (×4): qty 1

## 2013-11-27 MED ORDER — WARFARIN SODIUM 5 MG PO TABS
5.0000 mg | ORAL_TABLET | Freq: Once | ORAL | Status: AC
  Administered 2013-11-27: 17:00:00 5 mg via ORAL
  Filled 2013-11-27: qty 1

## 2013-11-27 MED ORDER — POTASSIUM CHLORIDE CRYS ER 20 MEQ PO TBCR
40.0000 meq | EXTENDED_RELEASE_TABLET | Freq: Once | ORAL | Status: AC
  Administered 2013-11-27: 13:00:00 40 meq via ORAL
  Filled 2013-11-27: qty 2

## 2013-11-27 MED ORDER — AZITHROMYCIN 500 MG PO TABS: 500.0000 mg | ORAL_TABLET | Freq: Every day | ORAL | Status: DC

## 2013-11-27 MED ORDER — COLLAGENASE 250 UNIT/GM EX OINT
TOPICAL_OINTMENT | CUTANEOUS | Status: DC
  Administered 2013-11-27: 1 via TOPICAL
  Filled 2013-11-27: qty 30

## 2013-11-27 MED ORDER — FUROSEMIDE 10 MG/ML IJ SOLN
80.0000 mg | Freq: Once | INTRAMUSCULAR | Status: AC
  Administered 2013-11-27: 80 mg via INTRAVENOUS
  Filled 2013-11-27: qty 8

## 2013-11-27 MED ORDER — FUROSEMIDE 10 MG/ML IJ SOLN
40.0000 mg | Freq: Every day | INTRAMUSCULAR | Status: DC
  Administered 2013-11-27: 40 mg via INTRAVENOUS
  Filled 2013-11-27: qty 4

## 2013-11-27 NOTE — Progress Notes (Signed)
PHARMACIST - PHYSICIAN COMMUNICATION DR:   Thedore Mins CONCERNING: Antibiotic IV to Oral Route Change Policy  RECOMMENDATION: This patient is receiving azithromycin by the intravenous route.  Based on criteria approved by the Pharmacy and Therapeutics Committee, the antibiotic(s) is/are being converted to the equivalent oral dose form(s).   DESCRIPTION: These criteria include:  Patient being treated for a respiratory tract infection, urinary tract infection, cellulitis or clostridium difficile associated diarrhea if on metronidazole  The patient is not neutropenic and does not exhibit a GI malabsorption state  The patient is eating (either orally or via tube) and/or has been taking other orally administered medications for a least 24 hours  The patient is improving clinically and has a Tmax < 100.5  If you have questions about this conversion, please contact the Pharmacy Department  []   [phone] )  [facility] [x]   [phone] )  [facility]  []   [phone] )  [facility] []   [phone] )  [facility]

## 2013-11-27 NOTE — Progress Notes (Signed)
Patient alert and oriented x4 today, wife at bedside for most of the shift.  Patient's wife concerned that discharge orders were placed today and she felt that patient was not ready to be discharged.  MD notified, discharge planned for tomorrow instead.  Will continue to monitor.

## 2013-11-27 NOTE — Progress Notes (Signed)
Physical Therapy Treatment Patient Details Name: Frank Mclean MRN: [id number] DOB: 05-18-1941 Today's Date: 11/27/2013 Time: 1545-1610 PT Time Calculation (min): 25 min  PT Assessment / Plan / Recommendation  History of Present Illness Pt receiving dressing changes to both LEs today   PT Comments   Pt will benefit from continued ambulation with nursing and RW ad lib. Gait was more steady with RW  Follow Up Recommendations  No PT follow up;Supervision - Intermittent     Does the patient have the potential to tolerate intense rehabilitation     Barriers to Discharge        Equipment Recommendations  None recommended by PT    Recommendations for Other Services    Frequency Min 3X/week   Progress towards PT Goals Progress towards PT goals: Progressing toward goals  Plan Current plan remains appropriate    Precautions / Restrictions Precautions Precautions: Other (comment) Precaution Comments: monitor O2 sats with mobility on RA.     Pertinent Vitals/Pain Pt with some pain in legs. See gait section for O2 sat information today    Mobility  Bed Mobility Bed Mobility: Not assessed;Supine to Sit;Sit to Supine (pt seated EOB eating dinner) Supine to Sit: 4: Min assist Sit to Supine: 4: Min assist Details for Bed Mobility Assistance: pt with some difficutly moving about on hospital mattress Transfers Transfers: Sit to Stand;Stand to Sit Sit to Stand: With upper extremity assist;With armrests;From bed;5: Supervision Stand to Sit: 5: Supervision;With upper extremity assist;With armrests;To chair/3-in-1 Details for Transfer Assistance: supervision for safety Ambulation/Gait Ambulation/Gait Assistance: 4: Min guard Ambulation Distance (Feet): 300 Feet Assistive device: Rolling walker Ambulation/Gait Assistance Details: used supplemental O2 at first as pt has been on in the room, sats 96% with ambulation, continued to walk on RA and sat progressively decreased to 90% Pt appeared  fatigued Pt needed occasional cues to keep speed under control and srand upstraight Gait Pattern: Step-through pattern;Antalgic (favoring right leg. ) Gait velocity: decreased General Gait Details: reapplied O2 back in the room. Pt with improved gait pattern and control with RW Stairs: No Wheelchair Mobility Wheelchair Mobility: No    Exercises     PT Diagnosis:    PT Problem List:   PT Treatment Interventions:     PT Goals (current goals can now be found in the care plan section)    Visit Information  Last PT Received On: 11/27/13 Assistance Needed: +1 History of Present Illness: Pt received dressing changes to both LEs today    Subjective Data      Cognition  Cognition Arousal/Alertness: Awake/alert Behavior During Therapy: WFL for tasks assessed/performed Overall Cognitive Status: Within Functional Limits for tasks assessed    Balance     End of Session PT - End of Session Activity Tolerance: Patient tolerated treatment well Patient left: with call bell/phone within reach;with family/visitor present;in bed   GP    Rosey Bath K. Sanders, [place] [phone] 11/27/2013, 4:36 PM

## 2013-11-27 NOTE — Progress Notes (Signed)
Orthopedic Tech Progress Note Patient Details:  Frank Mclean Mar 17, 1941 [id number]  Ortho Devices Type of Ortho Device: Roland Rack boot Ortho Device/Splint Location: RLE Ortho Device/Splint Interventions: Application   Shawnie Pons 11/27/2013, 8:07 AM

## 2013-11-27 NOTE — Progress Notes (Signed)
ANTICOAGULATION CONSULT NOTE  Pharmacy Consult for warfarin Indication: atrial fibrillation  No Known Allergies  Patient Measurements: Height: 6\' 2"  (188 cm) Weight: 219 lb 8 oz (99.565 kg) (scale C) IBW/kg (Calculated) : 82.2  Vital Signs: Temp: 98 F (36.7 C) (12/09 0617) Temp src: Oral (12/09 0617) BP: 152/81 mmHg (12/09 0617) Pulse Rate: 61 (12/09 0617)  Labs:  Recent Labs  11/26/13 0005 11/26/13 0420 11/26/13 0825 11/27/13 0540  HGB 10.0*  --  10.0*  --   HCT 32.8*  --  32.2*  --   PLT 278  --  265  --   LABPROT  --  19.5*  --  20.0*  INR  --  1.70*  --  1.76*  CREATININE 1.94*  --  1.97*  --     Estimated Creatinine Clearance: 41.5 ml/min (by C-G formula based on Cr of 1.97).   Medical History: Past Medical History  Diagnosis Date  . Gout   . HTN (hypertension)   . Hyperlipidemia   . Osteoarthritis   . History of CVA (cerebrovascular accident)     Left pontine infarct July 2004; changed from Plavix to Coumadin in 2004 per MD at [facility]  . GERD (gastroesophageal reflux disease)   . DJD (degenerative joint disease)   . BPH (benign prostatic hypertrophy)   . DM (diabetes mellitus), type 2   . Peripheral vascular disease   . Bilateral leg ulcer     ACHILLES AREA--  NONHEALING  . CKD (chronic kidney disease) stage 3, GFR 30-59 ml/min   . CAD (coronary artery disease)     a. LHC 4/12: Mid LAD 25%, mid diagonal 30%, AV circumflex 40%, proximal OM 25%, distal RCA 40%  . Chronic combined systolic and diastolic heart failure     a. Echo 05/2013: EF 25-30%, diffuse HK, restrictive physiology, trivial AI, mild MR, moderate LAE, reduced RV systolic function, PASP 42  . LBBB (left bundle branch block)   . Atrial fibrillation     a. amiodarone Rx started 05/2013;  b. chronic coumadin  . NICM (nonischemic cardiomyopathy)     a. EF 25-30%.  . CHF (congestive heart failure)     Medications:  Prescriptions prior to admission  Medication Sig Dispense Refill  .  amiodarone (PACERONE) 200 MG tablet Take 1 tablet (200 mg total) by mouth daily.      [name] amLODipine (NORVASC) 10 MG tablet Take 10 mg by mouth daily.      [name] atorvastatin (LIPITOR) 20 MG tablet Take 20 mg by mouth at bedtime.      . budesonide-formoterol (SYMBICORT) 160-4.5 MCG/ACT inhaler Inhale 2 puffs into the lungs daily as needed (shortness of breath).      . cholecalciferol (VITAMIN D) 1000 UNITS tablet Take 1,000 Units by mouth daily.      . collagenase (SANTYL) ointment Apply 1 application topically 2 (two) times a week. On Monday and Friday at home (for wounds on legs) - applied Wednesday at the wound center      . cyanocobalamin 1000 MCG tablet Take 1 tablet (1,000 mcg total) by mouth daily.  100 tablet  3  . ferrous sulfate 325 (65 FE) MG tablet Take 1 tablet (325 mg total) by mouth daily.  30 tablet  6  . glipiZIDE (GLUCOTROL) 5 MG tablet Take 0.5 tablets (2.5 mg total) by mouth 2 (two) times daily before a meal.  30 tablet  3  . hydrALAZINE (APRESOLINE) 50 MG tablet Take 50 mg by mouth 3 (  three) times daily.      [name] HYDROcodone-acetaminophen (NORCO/VICODIN) 5-325 MG per tablet Take 1 tablet by mouth 2 (two) times daily as needed for pain.      . isosorbide mononitrate (IMDUR) 60 MG 24 hr tablet Take 1 tablet (60 mg total) by mouth daily.  30 tablet  0  . labetalol (NORMODYNE) 200 MG tablet Take 1 tablet (200 mg total) by mouth 2 (two) times daily.  60 tablet  2  . potassium chloride SA (K-DUR,KLOR-CON) 20 MEQ tablet Take 20 mEq by mouth 3 (three) times daily.      [name] torsemide (DEMADEX) 20 MG tablet Take 20 mg by mouth 2 (two) times daily.      [name] triamcinolone cream (KENALOG) 0.5 % Apply 1 application topically 2 (two) times daily as needed (for skin breakdown).  30 g  0  . warfarin (COUMADIN) 5 MG tablet Take 2.5-5 mg by mouth See admin instructions. Takes 0.5 tablet (2.5mg ) on Monday and Friday. Takes 1 tablet (5mg ) all other days.        Assessment: Pt has a h/o of acute on chronic  heart failure, afib, renal insuff., htn, pvd, dm2, cva (2004). CHADS2=5. Pt was on warfarin PTA, his INR today is subtherapeutic at 1.76. Pt was on amiodarone PTA, azithromycin was started on 12/8.  Hgb, plts stable, pt showing no signs or symptoms of bleeding.  Goal of Therapy:  INR 2-3 Monitor platelets by anticoagulation protocol: Yes   Plan: Warfarin 5 mg po x1 Daily INR  Agapito Games, PharmD, BCPS Clinical Pharmacist 11/27/2013 8:55 AM

## 2013-11-27 NOTE — Consult Note (Addendum)
WOC wound consult note Reason for Consult: Requested by physician to remove bilat Una boots which were applied this AM and assess legs since pt is not going to be discharged as previously planned.  Wife at bedside states he needs Santyl applied today to right outer leg.  This has been ordered by the outpatient wound care center to be applied 3X week with leg wrap changes and wife states he does not wear Una boots. Wife wants dressing change performed today.   Wound type: Left outer posterior leg with full thickness wound, .2X.2X.2cm, pink and moist, no odor, minimal tan drainage.  Wife is not sure what type of dressing was being used to right leg but appearance is consistent with hydrofiber.  Applied Hydrofiber dressing and kerlex and coban as directed by wife who assessed wounds during consult. Measurement: Right leg with full thickness wound; Applied Santyl and nonadherent dressing, then kerlex and coban as directed by wife. Mod amt green-tinged drainage, slight odor.  Wound 50% red, 50% yellow slough.  2X2X.2cm and 3X4X.2cm Dressing procedure/placement/frequency:  Will plan to change dressings on Friday if patient still in the hospital at that time.  If he is discharged, then he can continue with home health dressing changes and follow-up at the outpatient wound care center. Please re-consult if further assistance is needed.  Thank-you,  Cammie Mcgee MSN, RN, CWOCN, [place], CNS [phone]

## 2013-11-27 NOTE — Discharge Summary (Addendum)
[facility]                                                                                   Frank Mclean, is a 72 y.o. male  DOB [birth year]  MRN [id number].  Admission date:  11/26/2013  Admitting Physician  Lynden Oxford, MD  Discharge Date:  11/28/2013   Primary MD  [name], MD  Recommendations for primary care physician for things to follow:   Monitor CBC, BMP and INR closely, repeat 2 view chest x-ray in a week.    Continue outpatient monitoring of lower extremity wounds as being done     Admission Diagnosis  Dyspnea [786.09]  Discharge Diagnosis  acute respiratory failure secondary to community-acquired pneumonia  Principal Problem:   Acute on chronic heart failure Active Problems:   DM (diabetes mellitus), type 2 with renal complications   HYPERTENSION   CVA   CKD (chronic kidney disease) stage 3, GFR 30-59 ml/min   Dyspnea      Past Medical History  Diagnosis Date  . Gout   . HTN (hypertension)   . Hyperlipidemia   . Osteoarthritis   . History of CVA (cerebrovascular accident)     Left pontine infarct July 2004; changed from Plavix to Coumadin in 2004 per MD at [facility]  . GERD (gastroesophageal reflux disease)   . DJD (degenerative joint disease)   . BPH (benign prostatic hypertrophy)   . DM (diabetes mellitus), type 2   . Peripheral vascular disease   . Bilateral leg ulcer     ACHILLES AREA--  NONHEALING  . CKD (chronic kidney disease) stage 3, GFR 30-59 ml/min   . CAD (coronary artery disease)     a. LHC 4/12: Mid LAD 25%, mid diagonal 30%, AV circumflex 40%, proximal OM 25%, distal RCA 40%  . Chronic combined systolic and diastolic heart failure     a. Echo 05/2013: EF 25-30%, diffuse HK, restrictive physiology, trivial AI, mild MR, moderate LAE, reduced RV systolic function, PASP 42  . LBBB (left bundle branch block)   . Atrial fibrillation     a. amiodarone Rx started 05/2013;  b. chronic coumadin  . NICM (nonischemic cardiomyopathy)      a. EF 25-30%.    Past Surgical History  Procedure Laterality Date  . Cardiac catheterization  04-16-2011   DR [facility]    NON-OBSTRUCTIVE CAD. MILDLY ELEVATED PULMONARY PRESSURES/ ELEVATED  END-DIASTOLIC PRESSURE  . Transthoracic echocardiogram  12-31-2010    MODERATE CONCENTRIC LVH/ SYSTOLIC FUNCTION SEVERELY REDUCED/ EF 25-30%/  SEVERE HYPOKINESIS OF ANTEROSEPTAL MYOCARDIUM  AND ENTIREAPICAL MYOCARDIUM /  MODERATE HYPOKINESIS OF LATERAL, INFEROLATERAL, INFERIOR,AND INFEROSEPTAL MYOCARIUM/  GRADE 3 DIASTOLIC DYSFUNCTION/ MILD MR  . Aortogram w/ bilateral lower extremitiy runoff  05-18-2013  DR [facility]EL[facility]    LEFT LEG OCCLUDED PERONEAL AND ANTERIOR TIBIAL ARTERIES/ HIGH GRADE STENOIS 80% MIDDLE AND DISTAL THIRD OF POSTERIOR TIBIAL ARTERY/ RIGHT PERONEAL AND ANTERIOR TIBIAL ARTERY OCCLUDED/ 40% STENOSIS DISTALLY      Discharge Condition: Stable        Follow-up Information   Follow up with [name], MD. Schedule an appointment as soon as possible for a visit on 11/29/2013. (  INR check @8 :45am spoke with Burna Mortimer )    Specialty:  Internal Medicine   Contact information:   [address] [place] [address] [phone]       Follow up with Rollene Rotunda, MD. Schedule an appointment as soon as possible for a visit in 1 week.   Specialty:  Cardiology   Contact information:   [address] [place] [address] [phone]         Consults obtained -  cardiology   Discharge Medications      Medication List    STOP taking these medications       labetalol 200 MG tablet  Commonly known as:  NORMODYNE      TAKE these medications       amiodarone 200 MG tablet  Commonly known as:  PACERONE  Take 1 tablet (200 mg total) by mouth daily.     amLODipine 10 MG tablet  Commonly known as:  NORVASC  Take 10 mg by mouth daily.     atorvastatin 20 MG tablet  Commonly known as:  LIPITOR  Take 20 mg by  mouth at bedtime.     azithromycin 500 MG tablet  Commonly known as:  ZITHROMAX  Take 1 tablet (500 mg total) by mouth daily.     budesonide-formoterol 160-4.5 MCG/ACT inhaler  Commonly known as:  SYMBICORT  Inhale 2 puffs into the lungs daily as needed (shortness of breath).     carvedilol 6.25 MG tablet  Commonly known as:  COREG  Take 1 tablet (6.25 mg total) by mouth 2 (two) times daily with a meal.     cholecalciferol 1000 UNITS tablet  Commonly known as:  VITAMIN D  Take 1,000 Units by mouth daily.     collagenase ointment  Commonly known as:  SANTYL  Apply 1 application topically 2 (two) times a week. On Monday and Friday at home (for wounds on legs) - applied Wednesday at the wound center     cyanocobalamin 1000 MCG tablet  Take 1 tablet (1,000 mcg total) by mouth daily.     ferrous sulfate 325 (65 FE) MG tablet  Take 1 tablet (325 mg total) by mouth daily.     glipiZIDE 5 MG tablet  Commonly known as:  GLUCOTROL  Take 0.5 tablets (2.5 mg total) by mouth 2 (two) times daily before a meal.     hydrALAZINE 50 MG tablet  Commonly known as:  APRESOLINE  Take 50 mg by mouth 3 (three) times daily.     HYDROcodone-acetaminophen 5-325 MG per tablet  Commonly known as:  NORCO/VICODIN  Take 1 tablet by mouth 2 (two) times daily as needed for pain.     isosorbide mononitrate 60 MG 24 hr tablet  Commonly known as:  IMDUR  Take 1 tablet (60 mg total) by mouth daily.     potassium chloride SA 20 MEQ tablet  Commonly known as:  K-DUR,KLOR-CON  Take 2 tablets (40 mEq total) by mouth 2 (two) times daily.     torsemide 20 MG tablet  Commonly known as:  DEMADEX  Take 1 tablet (20 mg total) by mouth 3 (three) times daily.     triamcinolone cream 0.5 %  Commonly known as:  KENALOG  Apply 1 application topically 2 (two) times daily as needed (for skin breakdown).     warfarin 5 MG tablet  Commonly known as:  COUMADIN  Take 2.5-5  mg by mouth See admin instructions. Takes  0.5 tablet (2.5mg ) on Monday and Friday. Takes 1 tablet (5mg ) all other days.         Diet and Activity recommendation: See Discharge Instructions below   Discharge Instructions     Follow with Primary MD [name], MD in 2 days   Get CBC, CMP, INR checked 2 days by Primary MD and again as instructed by your Primary MD.   Get a 2 view Chest X ray done in 1 week.  Get Medicines reviewed and adjusted.  Please request your Prim.MD to go over all Hospital Tests and Procedure/Radiological results at the follow up, please get all Hospital records sent to your Prim MD by signing hospital release before you go home.  Activity: As tolerated with Full fall precautions use walker/cane & assistance as needed   Diet:  Heart Healthy - Low carb  For Heart failure patients - Check your Weight same time everyday, if you gain over 2 pounds, or you develop in leg swelling, experience more shortness of breath or chest pain, call your Primary MD immediately. Follow Cardiac Low Salt Diet and 1.8 lit/day fluid restriction.  Disposition Home    If you experience worsening of your admission symptoms, develop shortness of breath, life threatening emergency, suicidal or homicidal thoughts you must seek medical attention immediately by calling 911 or calling your MD immediately  if symptoms less severe.  You Must read complete instructions/literature along with all the possible adverse reactions/side effects for all the Medicines you take and that have been prescribed to you. Take any new Medicines after you have completely understood and accpet all the possible adverse reactions/side effects.   Do not drive and provide baby sitting services if your were admitted for syncope or siezures until you have seen by Primary MD or a Neurologist and advised to do so again.  Do not drive when taking Pain medications.    Do not take more than prescribed Pain, Sleep and Anxiety Medications  Special  Instructions: If you have smoked or chewed Tobacco  in the last 2 yrs please stop smoking, stop any regular Alcohol  and or any Recreational drug use.  Wear Seat belts while driving.   Please note  You were cared for by a hospitalist during your hospital stay. If you have any questions about your discharge medications or the care you received while you were in the hospital after you are discharged, you can call the unit and asked to speak with the hospitalist on call if the hospitalist that took care of you is not available. Once you are discharged, your primary care physician will handle any further medical issues. Please note that NO REFILLS for any discharge medications will be authorized once you are discharged, as it is imperative that you return to your primary care physician (or establish a relationship with a primary care physician if you do not have one) for your aftercare needs so that they can reassess your need for medications and monitor your lab values.  Major procedures and Radiology Reports - PLEASE review detailed and final reports for all details, in brief -      Dg Chest 2 View  11/26/2013   CLINICAL DATA:  Shortness of breath and cough.  EXAM: CHEST  2 VIEW  COMPARISON:  Chest radiograph performed 09/06/2013  FINDINGS: The lungs are well-aerated. Left midlung airspace opacification is most compatible with pneumonia. Mild hazy right-sided and left basilar airspace opacities are also  seen. Small bilateral pleural effusions are suspected. No pneumothorax is seen.  The heart is mildly enlarged. No acute osseous abnormalities are seen.  IMPRESSION: 1. Left midlung airspace opacification is most compatible with pneumonia; additional mild hazy right-sided and left basilar airspace opacities seen. Pulmonary edema is considered less likely. 2. Small bilateral pleural effusions suspected, with mild cardiomegaly.   Electronically Signed   By: Roanna Raider M.D.   On: 11/26/2013 00:25     Micro Results     Recent Results (from the past 240 hour(s))  CULTURE, BLOOD (ROUTINE X 2)     Status: None   Collection Time    11/26/13  8:25 AM      Result Value Range Status   Specimen Description BLOOD LEFT ARM   Final   Special Requests     Final   Value: BOTTLES DRAWN AEROBIC AND ANAEROBIC AERO 10CC ANA 7CC   Culture  Setup Time     Final   Value: 11/26/2013 13:39     Performed at [facility]   Culture     Final   Value:        BLOOD CULTURE RECEIVED NO GROWTH TO DATE CULTURE WILL BE HELD FOR 5 DAYS BEFORE ISSUING A FINAL NEGATIVE REPORT     Performed at [facility]   Report Status PENDING   Incomplete  CULTURE, BLOOD (ROUTINE X 2)     Status: None   Collection Time    11/26/13  8:35 AM      Result Value Range Status   Specimen Description BLOOD RIGHT ARM   Final   Special Requests     Final   Value: BOTTLES DRAWN AEROBIC AND ANAEROBIC AERO 10CC ANA 2CC   Culture  Setup Time     Final   Value: 11/26/2013 13:39     Performed at [facility]   Culture     Final   Value:        BLOOD CULTURE RECEIVED NO GROWTH TO DATE CULTURE WILL BE HELD FOR 5 DAYS BEFORE ISSUING A FINAL NEGATIVE REPORT     Performed at [facility]   Report Status PENDING   Incomplete     History of present illness and  Hospital Course:     Kindly see H&P for history of present illness and admission details, please review complete Labs, Consult reports and Test reports for all details in brief Frank Mclean, is a 72 y.o. male, patient with history of essential hypertension, on a kidney disease stage IV baseline creatinine around 2, type 2 diabetes mellitus, chronic combined diastolic and systolic heart failure last EF 25%, Chr Afib on Coumadin.   Patient with above history was admitted to the hospital with chief complaints of shortness of breath with acute respiratory failure cause due to  community-acquired pneumonia mild acute on chronic combined systolic and  diastolic heart failure he was placed on empiric antibiotics along with oxygen and nebulizer treatment with good effect, he symptom-free now with no cough fever shortness of breath. Mild leukocytosis , not requiring any oxygen at rest but does require 2 L nasal cannula upon ambulation and he will be provided home oxygen upon discharge. He will be transitioned to oral azithromycin for 5 more days and discharged home.    He had mild acute on chronic combined systolic and diastolic heart failure for which he was seen by cardiology and received IV diuretics, his beta blocker was switched to Coreg  by cardiology, he's been advised to go home on Coreg along with Demadex 60 mg daily per cardiology. I have also increased his potassium supplementation dose. Will request primary care physician and primary cardiologist to kindly monitor his BMP in diuretic dose closely.     Patient has chronic lower extremity wounds which he sustained from a work-related accident a few months ago and he's been getting outpatient wound care which will be continued I will also order home RN and PT as he is some element of chronic weakness since a previous stroke (mild right-sided weakness).     He has history of atrial fibrillation his home dose Coumadin and beta blocker will be continued. We'll request PCP to monitor INR closely his last INR was slightly subtherapeutic as below.  Lab Results  Component Value Date   INR 2.11* 11/28/2013   INR 1.76* 11/27/2013   INR 1.70* 11/26/2013   PROTIME 15.7 05/28/2009        Today   Subjective:   Frank Mclean today has no headache,no chest abdominal pain,no new weakness tingling or numbness, feels much better wants to go home today.    Objective:   Blood pressure 127/75, pulse 62, temperature 98.2 F (36.8 C), temperature source Oral, resp. rate 18, height 6\' 2"  (1.88 m), weight 97.705 kg (215 lb 6.4 oz), SpO2 96.00%.   Intake/Output Summary (Last 24 hours) at 11/28/13  1010 Last data filed at 11/28/13 0900  Gross per 24 hour  Intake   1680 ml  Output   1450 ml  Net    230 ml    Exam Awake Alert, Oriented *3, No new F.N deficits, Normal affect [place].AT,PERRAL Supple Neck,No JVD, No cervical lymphadenopathy appriciated.  Symmetrical Chest wall movement, Good air movement bilaterally, CTAB RRR,No Gallops,Rubs or new Murmurs, No Parasternal Heave +ve B.Sounds, Abd Soft, Non tender, No organomegaly appriciated, No rebound -guarding or rigidity. No Cyanosis, Clubbing or edema, No new Rash or bruise, both lower extremities in elastic compressive bandage secondary to chronic leg wounds.  Data Review   CBC w Diff: Lab Results  Component Value Date   WBC 11.5* 11/26/2013   HGB 10.0* 11/26/2013   HCT 32.2* 11/26/2013   PLT 265 11/26/2013   LYMPHOPCT 7* 11/26/2013   MONOPCT 7 11/26/2013   EOSPCT 1 11/26/2013   BASOPCT 0 11/26/2013    CMP: Lab Results  Component Value Date   NA 144 11/26/2013   K 3.1* 11/28/2013   CL 106 11/26/2013   CO2 28 11/26/2013   BUN 19 11/26/2013   CREATININE 1.97* 11/26/2013   PROT 6.3 11/26/2013   ALBUMIN 3.3* 11/26/2013   BILITOT 0.6 11/26/2013   ALKPHOS 88 11/26/2013   AST 14 11/26/2013   ALT 12 11/26/2013  .  Lab Results  Component Value Date   INR 2.11* 11/28/2013   INR 1.76* 11/27/2013   INR 1.70* 11/26/2013   PROTIME 15.7 05/28/2009    Total Time in preparing paper work, data evaluation and todays exam - 35 minutes  Leroy Sea M.D on 11/28/2013 at 10:10 AM  [facility] Office  [phone]

## 2013-11-27 NOTE — Consult Note (Signed)
See WOC notes from this am, will not re-consult at this time.  MAustin RN,CWOCN [phone]

## 2013-11-27 NOTE — Care Management Note (Addendum)
  Page 2 of 2   11/27/2013     1:50:19 PM   CARE MANAGEMENT NOTE 11/27/2013  Patient:  LENIN, KUHNLE   Account Number:  [id number]  Date Initiated:  11/27/2013  Documentation initiated by:  [facility]  Subjective/Objective Assessment:   72 y.o. male with Past medical history of hypertension, heart failure, CVA on Coumadin, GERD, diabetes mellitus, CKD. Admitted with SOB//Home with spouse.     Action/Plan:   Strict I.'s and O.'s and daily weights//Resume Home Health services   Anticipated DC Date:  11/28/2013   Anticipated DC Plan:  HOME W HOME HEALTH SERVICES      DC Planning Services  CM consult      [facility] Choice  Resumption Of Svcs/PTA Provider   Choice offered to / List presented to:          [facility] arranged  HH-1 RN  HH-2 PT      [facility] agency  [facility]   Status of service:  Completed, signed off Medicare Important Message given?   (If response is "NO", the following Medicare IM given date fields will be blank) Date Medicare IM given:   Date Additional Medicare IM given:    Discharge Disposition:    Per UR Regulation:    If discussed at Long Length of Stay Meetings, dates discussed:    Comments:  11/27/13 1315 Oletta Cohn, RN, BSN, [employer] [phone] Spoke with pt and spouse at bedside regarding discharge planning.  Pt currently active with [facility] for RN services.  Resumption of care requested. NCM added PT services with [facility] via telephone.  No DME needs identified at this time.

## 2013-11-27 NOTE — Consult Note (Signed)
WOC wound consult note Reason for Consult: Consult requested for BLE wounds.  Ortho tech has just applied [employer] this AM to left and right legs as ordered by the primary team.  Pt states he was wearing Una boots prior to admission for chronic full thickness stasis ulcers which are changed twice a week, and he is followed by the outpatient wound care center but will miss his scheduled appointment on Wed since he is in the hospital.   Dressing procedure/placement/frequency: Plan to change Una boots on Friday when dressing change is due and will assess wounds at that time if pt is still in the hospital.  If he is discharged prior to that date, he can continue having Una boots changed twice a week by home health and can resume follow-up with the outpatient wound care center. Cammie Mcgee MSN, RN, CWOCN, [place], CNS [phone]

## 2013-11-27 NOTE — Consult Note (Signed)
Cardiology Consult Note   Patient ID: Frank Mclean MRN: [id number], DOB/AGE: 25-Jul-1941   Admit date: 11/26/2013 Date of Consult: 11/27/2013  Primary Physician: [name], MD Primary Cardiologist: Ival Bible, MD  Reason for consult: CHF  HPI: Frank Mclean is a 72 y.o. male w/ PMHx s/f chronic combined CHF, nonischemic cardiomyoapthy, nonobstructive CAD, PAF (amiodarone and Coumadin), chronic LBBB, DM2, HTN, HLD, PAD, CKD (stage III, baseline Cr 1.9), h/o CVA and chronic venous ulcers who was admitted to [facility] for CAP and decompensated heart failure.   He was admitted 9/25-9/30/2014 for acute on chronic renal failure and worsening LE edema. He was admitted for gentle IVF hydration. Low dose dopamine was started, renal function improved and he was discharged to SNF. Seen in follow-up 10/28, NYHA IIb-III at baseline. Improving overall. Weight 211 lbs at that time. Seen by Dr. Antoine Poche 12/4, weight trending up (220 lbs). Not weighing himself daily or ahdering to sliding scale diuretics as previously advised. Demadex was increased x 3 days. Advised to follow-up in a few days for BMET.   His breathing worsened over this time. He reported DOE, PND, orthopnea and nocturnal cough. Urination has reduced. Denies fevers, chill or productive cough. He was taken to the [facility] ED. CXR showed left midlung consolidation most c/w pneumonia over edema. Initial TnI WNL. Pro BNP 5744. INR subtherapeutic at 1.7. CMET- K 3.1,BUN 19, Cr 1.97, albumin 3.3, LFTs otherwise WNL. CBC- WBC 11.5K, Hgb 10/Hct 32. Weight 216 lbs yesterday, 219 lbs today. He was admitted by medicine and started on empiric abx for CAP. He was started on Lasix IV for decompensated CHF. I/O + 1199 mL, weight 216->219 lbs today. K was supplemented, 3.6 today.   He was about to be discharged today, but the patient's wife was concerned about his breathing and heart failure. Cardiology has been consulted.   Problem List: Past Medical  History  Diagnosis Date  . Gout   . HTN (hypertension)   . Hyperlipidemia   . Osteoarthritis   . History of CVA (cerebrovascular accident)     Left pontine infarct July 2004; changed from Plavix to Coumadin in 2004 per MD at [facility]  . GERD (gastroesophageal reflux disease)   . DJD (degenerative joint disease)   . BPH (benign prostatic hypertrophy)   . DM (diabetes mellitus), type 2   . Peripheral vascular disease   . Bilateral leg ulcer     ACHILLES AREA--  NONHEALING  . CKD (chronic kidney disease) stage 3, GFR 30-59 ml/min   . CAD (coronary artery disease)     a. LHC 4/12: Mid LAD 25%, mid diagonal 30%, AV circumflex 40%, proximal OM 25%, distal RCA 40%  . Chronic combined systolic and diastolic heart failure     a. Echo 05/2013: EF 25-30%, diffuse HK, restrictive physiology, trivial AI, mild MR, moderate LAE, reduced RV systolic function, PASP 42  . LBBB (left bundle branch block)   . Atrial fibrillation     a. amiodarone Rx started 05/2013;  b. chronic coumadin  . NICM (nonischemic cardiomyopathy)     a. EF 25-30%.  . CHF (congestive heart failure)     Past Surgical History  Procedure Laterality Date  . Cardiac catheterization  04-16-2011   [facility]    NON-OBSTRUCTIVE CAD. MILDLY ELEVATED PULMONARY PRESSURES/ ELEVATED  END-DIASTOLIC PRESSURE  . Transthoracic echocardiogram  12-31-2010    MODERATE CONCENTRIC LVH/ SYSTOLIC FUNCTION SEVERELY REDUCED/ EF 25-30%/  SEVERE HYPOKINESIS OF ANTEROSEPTAL MYOCARDIUM  AND ENTIREAPICAL MYOCARDIUM /  MODERATE HYPOKINESIS OF LATERAL, INFEROLATERAL, INFERIOR,AND INFEROSEPTAL MYOCARIUM/  GRADE 3 DIASTOLIC DYSFUNCTION/ MILD MR  . Aortogram w/ bilateral lower extremitiy runoff  05-18-2013  DR [facility]    LEFT LEG OCCLUDED PERONEAL AND ANTERIOR TIBIAL ARTERIES/ HIGH GRADE STENOIS 80% MIDDLE AND DISTAL THIRD OF POSTERIOR TIBIAL ARTERY/ RIGHT PERONEAL AND ANTERIOR TIBIAL ARTERY OCCLUDED/ 40% STENOSIS DISTALLY      Allergies: No Known  Allergies  Home Medications: Prior to Admission medications   Medication Sig Start Date End Date Taking? Authorizing Provider  amiodarone (PACERONE) 200 MG tablet Take 1 tablet (200 mg total) by mouth daily. 09/13/13  Yes Scott T Alben Spittle, PA-C  amLODipine (NORVASC) 10 MG tablet Take 10 mg by mouth daily.   Yes Historical Provider, MD  atorvastatin (LIPITOR) 20 MG tablet Take 20 mg by mouth at bedtime.   Yes Historical Provider, MD  budesonide-formoterol (SYMBICORT) 160-4.5 MCG/ACT inhaler Inhale 2 puffs into the lungs daily as needed (shortness of breath).   Yes Historical Provider, MD  cholecalciferol (VITAMIN D) 1000 UNITS tablet Take 1,000 Units by mouth daily.   Yes Historical Provider, MD  collagenase (SANTYL) ointment Apply 1 application topically 2 (two) times a week. On Monday and Friday at home (for wounds on legs) - applied Wednesday at the wound center   Yes Historical Provider, MD  cyanocobalamin 1000 MCG tablet Take 1 tablet (1,000 mcg total) by mouth daily. 06/29/13  Yes Lonia Skinner, MD  ferrous sulfate 325 (65 FE) MG tablet Take 1 tablet (325 mg total) by mouth daily. 07/05/13  Yes Georgina Quint Plotnikov, MD  glipiZIDE (GLUCOTROL) 5 MG tablet Take 0.5 tablets (2.5 mg total) by mouth 2 (two) times daily before a meal. 06/29/13  Yes Lonia Skinner, MD  hydrALAZINE (APRESOLINE) 50 MG tablet Take 50 mg by mouth 3 (three) times daily.   Yes Historical Provider, MD  HYDROcodone-acetaminophen (NORCO/VICODIN) 5-325 MG per tablet Take 1 tablet by mouth 2 (two) times daily as needed for pain.   Yes Historical Provider, MD  isosorbide mononitrate (IMDUR) 60 MG 24 hr tablet Take 1 tablet (60 mg total) by mouth daily. 06/29/13  Yes Lonia Skinner, MD  labetalol (NORMODYNE) 200 MG tablet Take 1 tablet (200 mg total) by mouth 2 (two) times daily. 06/29/13  Yes Lonia Skinner, MD  potassium chloride SA (K-DUR,KLOR-CON) 20 MEQ tablet Take 20 mEq by mouth 3 (three) times daily.   Yes Historical  Provider, MD  torsemide (DEMADEX) 20 MG tablet Take 20 mg by mouth 2 (two) times daily.   Yes Historical Provider, MD  triamcinolone cream (KENALOG) 0.5 % Apply 1 application topically 2 (two) times daily as needed (for skin breakdown). 06/29/13  Yes Lonia Skinner, MD  warfarin (COUMADIN) 5 MG tablet Take 2.5-5 mg by mouth See admin instructions. Takes 0.5 tablet (2.5mg ) on Monday and Friday. Takes 1 tablet (5mg ) all other days.   Yes Historical Provider, MD  azithromycin (ZITHROMAX) 500 MG tablet Take 1 tablet (500 mg total) by mouth daily. 11/28/13   Leroy Sea, MD    Inpatient Medications:  . amiodarone  200 mg Oral Daily  . amLODipine  10 mg Oral Daily  . atorvastatin  20 mg Oral QHS  . [START ON 11/28/2013] azithromycin  500 mg Oral Daily  . cefTRIAXone (ROCEPHIN)  IV  1 g Intravenous Q24H  . collagenase   Topical UD  . furosemide  40 mg Intravenous Daily  . glipiZIDE  2.5 mg Oral BID AC  . hydrALAZINE  50 mg Oral TID  . insulin aspart  0-5 Units Subcutaneous QHS  . insulin aspart  0-9 Units Subcutaneous TID WC  . isosorbide mononitrate  60 mg Oral Daily  . labetalol  200 mg Oral BID  . Warfarin - Pharmacist Dosing Inpatient   Does not apply q1800   Prescriptions prior to admission  Medication Sig Dispense Refill  . amiodarone (PACERONE) 200 MG tablet Take 1 tablet (200 mg total) by mouth daily.      [name] amLODipine (NORVASC) 10 MG tablet Take 10 mg by mouth daily.      [name] atorvastatin (LIPITOR) 20 MG tablet Take 20 mg by mouth at bedtime.      . budesonide-formoterol (SYMBICORT) 160-4.5 MCG/ACT inhaler Inhale 2 puffs into the lungs daily as needed (shortness of breath).      . cholecalciferol (VITAMIN D) 1000 UNITS tablet Take 1,000 Units by mouth daily.      . collagenase (SANTYL) ointment Apply 1 application topically 2 (two) times a week. On Monday and Friday at home (for wounds on legs) - applied Wednesday at the wound center      . cyanocobalamin 1000 MCG tablet Take 1  tablet (1,000 mcg total) by mouth daily.  100 tablet  3  . ferrous sulfate 325 (65 FE) MG tablet Take 1 tablet (325 mg total) by mouth daily.  30 tablet  6  . glipiZIDE (GLUCOTROL) 5 MG tablet Take 0.5 tablets (2.5 mg total) by mouth 2 (two) times daily before a meal.  30 tablet  3  . hydrALAZINE (APRESOLINE) 50 MG tablet Take 50 mg by mouth 3 (three) times daily.      [name] HYDROcodone-acetaminophen (NORCO/VICODIN) 5-325 MG per tablet Take 1 tablet by mouth 2 (two) times daily as needed for pain.      . isosorbide mononitrate (IMDUR) 60 MG 24 hr tablet Take 1 tablet (60 mg total) by mouth daily.  30 tablet  0  . labetalol (NORMODYNE) 200 MG tablet Take 1 tablet (200 mg total) by mouth 2 (two) times daily.  60 tablet  2  . potassium chloride SA (K-DUR,KLOR-CON) 20 MEQ tablet Take 20 mEq by mouth 3 (three) times daily.      [name] torsemide (DEMADEX) 20 MG tablet Take 20 mg by mouth 2 (two) times daily.      [name] triamcinolone cream (KENALOG) 0.5 % Apply 1 application topically 2 (two) times daily as needed (for skin breakdown).  30 g  0  . warfarin (COUMADIN) 5 MG tablet Take 2.5-5 mg by mouth See admin instructions. Takes 0.5 tablet (2.5mg ) on Monday and Friday. Takes 1 tablet (5mg ) all other days.        Family History  Problem Relation Age of Onset  . Hypertension Mother   . Heart disease Mother   . Diabetes Mother   . Gout Other   . Stroke Other   . Hypertension Father   . Heart disease Father      History   Social History  . Marital Status: Married    Spouse Name: N/A    Number of Children: N/A  . Years of Education: N/A   Occupational History  . Retired - Working in Product manager    Social History Main Topics  . Smoking status: Former Smoker    Quit date: 04/12/1974  . Smokeless tobacco: Never Used  . Alcohol Use: No     Comment: previously drank - "love  cognac" quit 15 yrs ago.  . Drug Use: No  . Sexual Activity: No   Other Topics Concern  . Not on file   Social History  Narrative   Worked at [employer] year.  He is married and lives with his wife Frank Mclean in [place].  Has one daughter Frank Mclean     Review of Systems: General: negative for chills, fever, night sweats or weight changes.  Cardiovascular: positive for dyspnea on exertion, PND, orthopnea, negative for chest pain, dyspnea on exertion, edema, palpitations Dermatological: negative for rash Respiratory: positive for cough, negative for wheezing Urologic: negative for hematuria Abdominal: negative for nausea, vomiting, diarrhea, bright red blood per rectum, melena, or hematemesis Neurologic: negative for visual changes, syncope, or dizziness All other systems reviewed and are otherwise negative except as noted above.  Physical Exam: Blood pressure 137/85, pulse 62, temperature 98.1 F (36.7 C), temperature source Oral, resp. rate 20, height 6\' 2"  (1.88 m), weight 99.565 kg (219 lb 8 oz), SpO2 100.00%.   General: Well developed, well nourished, in no acute distress. Head: Normocephalic, atraumatic, sclera non-icteric, no xanthomas, nares are without discharge.  Neck: Negative for carotid bruits. JVP 7 cm Lungs: Clear bilaterally to auscultation without wheezes, rales, or rhonchi. Breathing is unlabored. Heart: RRR with S1 S2. No murmurs, rubs, or gallops appreciated. Abdomen: Soft, non-tender, distended with normoactive bowel sounds. No hepatomegaly. No rebound/guarding. No obvious abdominal masses. Msk:  Strength and tone appears normal for age. Extremities: Leg wraps appreciated. No clubbing, cyanosis or edema.  Distal pedal pulses are 2+ and equal bilaterally. Neuro: Alert and oriented X 3. Moves all extremities spontaneously. Psych:  Responds to questions appropriately with a normal affect.  Labs: Recent Labs     11/26/13  0005  11/26/13  0825  WBC  11.4*  11.5*  HGB  10.0*  10.0*  HCT  32.8*  32.2*  MCV  80.2  81.3  PLT  278  265   Recent Labs Lab 11/26/13 0005  11/26/13 0825 11/27/13 0540  NA 141 144  --   K 3.1* 3.1* 3.6  CL 103 106  --   CO2 26 28  --   BUN 20 19  --   CREATININE 1.94* 1.97*  --   CALCIUM 9.0 9.1  --   PROT  --  6.3  --   BILITOT  --  0.6  --   ALKPHOS  --  88  --   ALT  --  12  --   AST  --  14  --   GLUCOSE 124* 117*  --    Recent Labs     11/26/13  0825  HGBA1C  5.4   Radiology/Studies: Dg Chest 2 View  11/27/2013   CLINICAL DATA:  Shortness of Breath  EXAM: CHEST  2 VIEW  COMPARISON:  Is 11/26/2013  FINDINGS: Cardiomediastinal silhouette is stable. Persistent left perihilar infiltrate/ pneumonia. Small left pleural effusion with with left basilar atelectasis or infiltrate. Follow-up to complete resolution is recommended.  IMPRESSION: Persistent left perihilar infiltrate/ pneumonia. Small left pleural effusion with with left basilar atelectasis or infiltrate. Follow-up to complete resolution is recommended.   Electronically Signed   By: Natasha Mead M.D.   On: 11/27/2013 14:05   Dg Chest 2 View  11/26/2013   CLINICAL DATA:  Shortness of breath and cough.  EXAM: CHEST  2 VIEW  COMPARISON:  Chest radiograph performed 09/06/2013  FINDINGS: The lungs are well-aerated. Left midlung airspace opacification  is most compatible with pneumonia. Mild hazy right-sided and left basilar airspace opacities are also seen. Small bilateral pleural effusions are suspected. No pneumothorax is seen.  The heart is mildly enlarged. No acute osseous abnormalities are seen.  IMPRESSION: 1. Left midlung airspace opacification is most compatible with pneumonia; additional mild hazy right-sided and left basilar airspace opacities seen. Pulmonary edema is considered less likely. 2. Small bilateral pleural effusions suspected, with mild cardiomegaly.   Electronically Signed   By: Roanna Raider M.D.   On: 11/26/2013 00:25    EKG: sinus bradycardia, 59 bpm, LBBB, LAD  ASSESSMENT AND PLAN:   72 y.o. male w/ PMHx s/f chronic combined CHF, nonischemic  cardiomyoapthy, nonobstructive CAD, PAF (amiodarone and Coumadin), chronic LBBB, DM2, HTN, HLD, PAD, CKD (stage III, baseline Cr 1.9), h/o CVA and chronic venous ulcers who was admitted to [facility] for CAP and decompensated heart failure.   1. Acute on chronic combined CHF/NICM, EF 25-30% 2. Nonobstructive CAD 3. PAF  4. Subtherapeutic INR 5. Type 2 DM 6. HTN 7. HLD 8. H/o CVA 9. CKD, stage III 10. Chronic venous ulcers 11. Hypokalemia, repleted 12. PAD 13. Chronic LBBB  14. CAP  The patient presents with worsening DOE, PND, orthopnea and nocturnal cough. He drinks significant amounts of water at home. Had not been adhering to sliding diuretic regimen based on weight. Weight 216 lbs yesterday->219 lbs today. BNP 5K. CXR with LML consolidation felt to represent pneumonia. Lasix 40mg  IV with minimal UOP. Walked in the halls today without difficulty, however wife concerned that breathing has not improved. On exam, minimal JVD, + abdominal distention, lungs clear. Suspect patient has poor pulmonary reserve with underlying CAP. Objectively, he is volume overloaded. Will give Lasix 80mg  IV tonight, restart Demadex 60mg  tomorrow. Monitor renal function and electrolytes. If negative UOP and breathing improved, discharge home on Demadex 60 with close follow-up. Consider CRT-D- LBBB, EF 25%, NYHA II-III symptoms. Coumadin per pharmacy for subtherapeutic INR. Will switch labetolol for carvedilol. CKD limits the addition of ACEi/ARB or spironolactone.    Signed, R. Hurman Horn, PA-C 11/27/2013, 5:53 PM   Attending note:  Patient seen and examined. Discussed the case with [name], reviewed records as well as recent office followup with Dr. Antoine Poche. Frank Mclean is now admitted to the hospital with progressive shortness of breath and cough, diagnosed with pneumonia, chest x-ray showing a left mid lung consolidation, also small effusion on that side. Most recently he was increased to Demadex  60 mg daily at home, had not been on this higher dose but for one day according to his wife. He has been on IV Lasix 40 mg daily here in the hospital. Fairly even intake and output, still with some mild volume overload. Has CKD stage 3-4 at baseline, creatinine 1.9 currently.   Patient has nonischemic cardiomyopathy with LVEF 25-30%, left bundle branch block. Baseline regimen reviewed. He is not on ACE inhibitor or ARB with renal insufficiency, has been able to tolerate hydralazine and nitrate combination. Beta blocker is labetalol.  Our plan is to give an additional dose of Lasix 80 mg IV this evening, observe output and determine tomorrow whether he can be transitioned back to higher dose Demadex at 60 mg daily with close followup as an outpatient, versus continuation of IV diuretic. Will also change from labetalol to Coreg. [facility] medical therapy is further optimized, may be worth considering EP consultation regarding resynchronization therapies.  Frank Mclean, M.D., F.A.C.C.

## 2013-11-28 DIAGNOSIS — I509 Heart failure, unspecified: Secondary | ICD-10-CM

## 2013-11-28 DIAGNOSIS — I5043 Acute on chronic combined systolic (congestive) and diastolic (congestive) heart failure: Principal | ICD-10-CM

## 2013-11-28 LAB — GLUCOSE, CAPILLARY
Glucose-Capillary: 87 mg/dL (ref 70–99)
Glucose-Capillary: 81 mg/dL (ref 70–99)

## 2013-11-28 LAB — PROTIME-INR
INR: 2.11 — ABNORMAL HIGH (ref 0.00–1.49)
Prothrombin Time: 23 seconds — ABNORMAL HIGH (ref 11.6–15.2)

## 2013-11-28 LAB — POTASSIUM: Potassium: 3.1 mEq/L — ABNORMAL LOW (ref 3.5–5.1)

## 2013-11-28 MED ORDER — WARFARIN SODIUM 5 MG PO TABS
5.0000 mg | ORAL_TABLET | Freq: Once | ORAL | Status: DC
  Filled 2013-11-28: qty 1

## 2013-11-28 MED ORDER — TORSEMIDE 20 MG PO TABS: 20.0000 mg | ORAL_TABLET | Freq: Three times a day (TID) | ORAL | Status: DC

## 2013-11-28 MED ORDER — POTASSIUM CHLORIDE CRYS ER 20 MEQ PO TBCR
40.0000 meq | EXTENDED_RELEASE_TABLET | Freq: Once | ORAL | Status: AC
  Administered 2013-11-28: 40 meq via ORAL
  Filled 2013-11-28: qty 2

## 2013-11-28 MED ORDER — CARVEDILOL 6.25 MG PO TABS: 6.2500 mg | ORAL_TABLET | Freq: Two times a day (BID) | ORAL | Status: DC

## 2013-11-28 MED ORDER — POTASSIUM CHLORIDE CRYS ER 20 MEQ PO TBCR
20.0000 meq | EXTENDED_RELEASE_TABLET | Freq: Once | ORAL | Status: AC
  Administered 2013-11-28: 11:00:00 20 meq via ORAL

## 2013-11-28 MED ORDER — POTASSIUM CHLORIDE CRYS ER 20 MEQ PO TBCR: 40.0000 meq | EXTENDED_RELEASE_TABLET | Freq: Two times a day (BID) | ORAL | Status: DC

## 2013-11-28 NOTE — Progress Notes (Signed)
    Subjective:  Denies CP or dyspnea   Objective:  Filed Vitals:   11/27/13 1710 11/27/13 2122 11/28/13 0427 11/28/13 0504  BP: 137/85 142/82 144/79   Pulse: 62 54 57   Temp:  98.4 F (36.9 C) 98 F (36.7 C)   TempSrc:  Oral Oral   Resp:  20 18   Height:      Weight:    215 lb 6.4 oz (97.705 kg)  SpO2:  100% 100%     Intake/Output from previous day:  Intake/Output Summary (Last 24 hours) at 11/28/13 0749 Last data filed at 11/28/13 0554  Gross per 24 hour  Intake   1440 ml  Output   1450 ml  Net    -10 ml    Physical Exam: Physical exam: Well-developed well-nourished in no acute distress.  Skin is warm and dry.  HEENT is normal.  Neck is supple.  Chest is clear to auscultation with normal expansion.  Cardiovascular exam is regular rate and rhythm.  Abdominal exam nontender or distended. No masses palpated. Extremities lower ext dressings in place neuro grossly intact    Lab Results: Basic Metabolic Panel:  Recent Labs  96/04/54 0005 11/26/13 0825 11/27/13 0540 11/28/13 0525  NA 141 144  --   --   K 3.1* 3.1* 3.6 3.1*  CL 103 106  --   --   CO2 26 28  --   --   GLUCOSE 124* 117*  --   --   BUN 20 19  --   --   CREATININE 1.94* 1.97*  --   --   CALCIUM 9.0 9.1  --   --    CBC:  Recent Labs  11/26/13 0005 11/26/13 0825  WBC 11.4* 11.5*  NEUTROABS  --  9.8*  HGB 10.0* 10.0*  HCT 32.8* 32.2*  MCV 80.2 81.3  PLT 278 265     Assessment/Plan:  72 y.o. male w/ PMHx s/f chronic combined CHF, nonischemic cardiomyoapthy, nonobstructive CAD, PAF (amiodarone and Coumadin), chronic LBBB, DM2, HTN, HLD, PAD, CKD (stage III, baseline Cr 1.9), h/o CVA and chronic venous ulcers who was admitted to [facility] for CAP and decompensated heart failure.  1. Acute on chronic combined CHF/NICM, EF 25-30%  2. Nonobstructive CAD  3. PAF  4. Subtherapeutic INR now therapeutic 5. Type 2 DM  6. HTN  7. HLD  8. H/o CVA  9. CKD, stage III  10. Chronic venous  ulcers  11. Hypokalemia 12. PAD  13. Chronic LBBB  14. CAP  Patient improved this morning. Patient can be discharged from a cardiac standpoint. Would continue Demadex 60 mg daily. Add kdur 40 meq po daily. Check potassium and renal function on Friday, December 12 with results to Dr. Antoine Poche. Labetalol discontinued in favor of Coreg as outlined in consult note. Followup with Dr. Antoine Poche in 2 weeks. Would consider CRT-D following discharge. Continue hydralazine nitrates. Not on an ACE inhibitor because of renal insufficiency. It may be worthwhile to have the patient followed in CHF clinic. I will leave this to Dr. Antoine Poche.   Frank Mclean 11/28/2013, 7:49 AM

## 2013-11-28 NOTE — Progress Notes (Signed)
ANTICOAGULATION CONSULT NOTE  Pharmacy Consult for warfarin Indication: atrial fibrillation  No Known Allergies  Patient Measurements: Height: 6\' 2"  (188 cm) Weight: 215 lb 6.4 oz (97.705 kg) (Scale C) IBW/kg (Calculated) : 82.2  Vital Signs: Temp: 98 F (36.7 C) (12/10 0427) Temp src: Oral (12/10 0427) BP: 144/79 mmHg (12/10 0427) Pulse Rate: 57 (12/10 0427)  Labs:  Recent Labs  11/26/13 0005 11/26/13 0420 11/26/13 0825 11/27/13 0540 11/28/13 0525  HGB 10.0*  --  10.0*  --   --   HCT 32.8*  --  32.2*  --   --   PLT 278  --  265  --   --   LABPROT  --  19.5*  --  20.0* 23.0*  INR  --  1.70*  --  1.76* 2.11*  CREATININE 1.94*  --  1.97*  --   --     Estimated Creatinine Clearance: 38.2 ml/min (by C-G formula based on Cr of 1.97).   Medical History: Past Medical History  Diagnosis Date  . Gout   . HTN (hypertension)   . Hyperlipidemia   . Osteoarthritis   . History of CVA (cerebrovascular accident)     Left pontine infarct July 2004; changed from Plavix to Coumadin in 2004 per MD at [facility]  . GERD (gastroesophageal reflux disease)   . DJD (degenerative joint disease)   . BPH (benign prostatic hypertrophy)   . DM (diabetes mellitus), type 2   . Peripheral vascular disease   . Bilateral leg ulcer     ACHILLES AREA--  NONHEALING  . CKD (chronic kidney disease) stage 3, GFR 30-59 ml/min   . CAD (coronary artery disease)     a. LHC 4/12: Mid LAD 25%, mid diagonal 30%, AV circumflex 40%, proximal OM 25%, distal RCA 40%  . Chronic combined systolic and diastolic heart failure     a. Echo 05/2013: EF 25-30%, diffuse HK, restrictive physiology, trivial AI, mild MR, moderate LAE, reduced RV systolic function, PASP 42  . LBBB (left bundle branch block)   . Atrial fibrillation     a. amiodarone Rx started 05/2013;  b. chronic coumadin  . NICM (nonischemic cardiomyopathy)     a. EF 25-30%.    Medications:  Prescriptions prior to admission  Medication Sig Dispense  Refill  . amiodarone (PACERONE) 200 MG tablet Take 1 tablet (200 mg total) by mouth daily.      [name] amLODipine (NORVASC) 10 MG tablet Take 10 mg by mouth daily.      [name] atorvastatin (LIPITOR) 20 MG tablet Take 20 mg by mouth at bedtime.      . budesonide-formoterol (SYMBICORT) 160-4.5 MCG/ACT inhaler Inhale 2 puffs into the lungs daily as needed (shortness of breath).      . cholecalciferol (VITAMIN D) 1000 UNITS tablet Take 1,000 Units by mouth daily.      . collagenase (SANTYL) ointment Apply 1 application topically 2 (two) times a week. On Monday and Friday at home (for wounds on legs) - applied Wednesday at the wound center      . cyanocobalamin 1000 MCG tablet Take 1 tablet (1,000 mcg total) by mouth daily.  100 tablet  3  . ferrous sulfate 325 (65 FE) MG tablet Take 1 tablet (325 mg total) by mouth daily.  30 tablet  6  . glipiZIDE (GLUCOTROL) 5 MG tablet Take 0.5 tablets (2.5 mg total) by mouth 2 (two) times daily before a meal.  30 tablet  3  . hydrALAZINE (APRESOLINE)  50 MG tablet Take 50 mg by mouth 3 (three) times daily.      [name] HYDROcodone-acetaminophen (NORCO/VICODIN) 5-325 MG per tablet Take 1 tablet by mouth 2 (two) times daily as needed for pain.      . isosorbide mononitrate (IMDUR) 60 MG 24 hr tablet Take 1 tablet (60 mg total) by mouth daily.  30 tablet  0  . labetalol (NORMODYNE) 200 MG tablet Take 1 tablet (200 mg total) by mouth 2 (two) times daily.  60 tablet  2  . potassium chloride SA (K-DUR,KLOR-CON) 20 MEQ tablet Take 20 mEq by mouth 3 (three) times daily.      [name] torsemide (DEMADEX) 20 MG tablet Take 20 mg by mouth 2 (two) times daily.      [name] triamcinolone cream (KENALOG) 0.5 % Apply 1 application topically 2 (two) times daily as needed (for skin breakdown).  30 g  0  . warfarin (COUMADIN) 5 MG tablet Take 2.5-5 mg by mouth See admin instructions. Takes 0.5 tablet (2.5mg ) on Monday and Friday. Takes 1 tablet (5mg ) all other days.        Assessment: Pt has a h/o of acute on  chronic heart failure, afib, renal insuff., htn, pvd, dm2, cva (2004). CHADS2=5. Pt was on warfarin PTA, his INR today is therapeutic at 2.11. Pt was on amiodarone PTA, azithromycin was started on 12/8.  Hgb, plts stable, pt showing no signs or symptoms of bleeding.  Goal of Therapy:  INR 2-3 Monitor platelets by anticoagulation protocol: Yes   Plan: Warfarin 5 mg po x1 Daily INR  Agapito Games, PharmD, BCPS Clinical Pharmacist 11/28/2013 8:25 AM

## 2013-11-28 NOTE — Plan of Care (Signed)
Problem: Phase I Progression Outcomes Goal: EF % per last Echo/documented,Core Reminder form on chart Outcome: Completed/Met Date Met:  11/28/13 EF 25-30% per ECHO completed on 05/25/2013.

## 2013-11-28 NOTE — Progress Notes (Signed)
SATURATION QUALIFICATIONS: (This note is used to comply with regulatory documentation for home oxygen)  Patient Saturations on Room Air at Rest = 93%   Patient Saturations on Room Air while Ambulating = 85%  Patient Saturations on 2 Liters of oxygen while Ambulating = 95%  Please briefly explain why patient needs home oxygen: PT AMBULATORY SPO2 DECLINES

## 2013-11-28 NOTE — Progress Notes (Signed)
Patient evaluated for community based chronic disease management services with [facility] Care Management Program as a benefit of patient's [employer]. Spoke with patient's wife at bedside to explain [facility] Care Management services.  Wife has declined services at this time citing that she has [facility] and would like to discuss [facility] with their Pink Cardiologist prior to engagement.  Left contact information and [facility] literature with wife. Made Inpatient Case Manager aware that [facility] Care Management following. Of note, [facility] Care Management services does not replace or interfere with any services that are arranged by inpatient case management or social work.  For additional questions or referrals please contact Anibal Henderson BSN RN [facility] Liaison at [phone].

## 2013-11-28 NOTE — Progress Notes (Signed)
Pt given discharge instructions wife at the bedside questions answered. O2 at 2L applied to pt for discharge.

## 2013-11-28 NOTE — Progress Notes (Signed)
Received call from wife at bedside she would now like to have thn to engage her husband's care at home.  Written consents obtained.  Made RNCM aware.  Of note, [facility] Care Management services does not replace or interfere with any services that are arranged by inpatient case management or social work.  For additional questions or referrals please contact Anibal Henderson BSN RN [facility] Liaison at [phone].

## 2013-11-29 ENCOUNTER — Ambulatory Visit (INDEPENDENT_AMBULATORY_CARE_PROVIDER_SITE_OTHER): Payer: Medicare Other | Admitting: Internal Medicine

## 2013-11-29 ENCOUNTER — Encounter: Payer: Self-pay | Admitting: Internal Medicine

## 2013-11-29 VITALS — BP 120/80 | HR 62 | Temp 96.7°F | Resp 16 | Wt 220.0 lb

## 2013-11-29 DIAGNOSIS — R0902 Hypoxemia: Secondary | ICD-10-CM

## 2013-11-29 DIAGNOSIS — E538 Deficiency of other specified B group vitamins: Secondary | ICD-10-CM

## 2013-11-29 DIAGNOSIS — E1129 Type 2 diabetes mellitus with other diabetic kidney complication: Secondary | ICD-10-CM

## 2013-11-29 DIAGNOSIS — I4891 Unspecified atrial fibrillation: Secondary | ICD-10-CM

## 2013-11-29 DIAGNOSIS — I5022 Chronic systolic (congestive) heart failure: Secondary | ICD-10-CM

## 2013-11-29 DIAGNOSIS — J189 Pneumonia, unspecified organism: Secondary | ICD-10-CM

## 2013-11-29 MED ORDER — ISOSORBIDE MONONITRATE ER 60 MG PO TB24: 60.0000 mg | ORAL_TABLET | Freq: Every day | ORAL | Status: DC

## 2013-11-29 NOTE — Patient Instructions (Signed)
Take Demadex 20 mg tabs 3 a day until gone, then start Demadex 100 mg 1/2 a day if ok.

## 2013-11-29 NOTE — Assessment & Plan Note (Signed)
Continue with current prescription therapy as reflected on the Med list.  

## 2013-11-29 NOTE — Progress Notes (Signed)
Subjective:    HPI Hospital d/c f/u for CHF and L CAP (d/c'd on 12/10)   72 y.o. male w/ PMHx s/f chronic combined CHF, nonischemic cardiomyoapthy, nonobstructive CAD, PAF (amiodarone and Coumadin), chronic LBBB, DM2, HTN, HLD, PAD, CKD (stage III, baseline Cr 1.9), h/o CVA and chronic venous ulcers who was admitted to [facility] for CAP and decompensated heart failure.  1. Acute on chronic combined CHF/NICM, EF 25-30%  2. Nonobstructive CAD  3. PAF  4. Subtherapeutic INR now therapeutic  5. Type 2 DM  6. HTN  7. HLD  8. H/o CVA  9. CKD, stage III  10. Chronic venous ulcers  11. Hypokalemia  12. PAD  13. Chronic LBBB  14. CAP - L lung Patient improved and was discharged on 12/10.    F/u: Near syncope  Active Problems:  DM (diabetes mellitus), type 2 with renal complications  HYPERLIPIDEMIA  HYPERTENSION  Acute on chronic combined systolic and diastolic congestive heart failure  CVA  CKD (chronic kidney disease) stage 3, GFR 30-59 ml/min  Wound, open, leg  Atherosclerotic peripheral vascular disease with intermittent claudication  Orthostatic hypotension  Hypokalemia  Elevated troponin  Atrial fibrillation with RVR  Wt Readings from Last 3 Encounters:  11/29/13 220 lb (99.791 kg)  11/28/13 215 lb 6.4 oz (97.705 kg)  11/22/13 220 lb (99.791 kg)   BP Readings from Last 3 Encounters:  11/29/13 120/80  11/28/13 127/75  11/22/13 138/92      Review of Systems  Constitutional: Positive for fatigue. Negative for appetite change and unexpected weight change.  HENT: Negative for congestion, nosebleeds, sneezing and trouble swallowing.   Eyes: Negative for itching and visual disturbance.  Cardiovascular: Positive for leg swelling. Negative for palpitations.  Gastrointestinal: Negative for nausea, diarrhea, blood in stool and abdominal distention.  Genitourinary: Negative for frequency and hematuria.  Musculoskeletal: Positive for arthralgias. Negative for back  pain, gait problem, joint swelling and neck pain.  Neurological: Negative for dizziness, tremors, speech difficulty and weakness.  Psychiatric/Behavioral: Negative for sleep disturbance, dysphoric mood and agitation. The patient is nervous/anxious.        Objective:   Physical Exam  Constitutional: He is oriented to person, place, and time. He appears well-developed.  HENT:  Mouth/Throat: Oropharynx is clear and moist.  Eyes: Conjunctivae are normal. Pupils are equal, round, and reactive to light.  Neck: Normal range of motion. No JVD present. No thyromegaly present.  Cardiovascular: Normal rate, regular rhythm, normal heart sounds and intact distal pulses.  Exam reveals no gallop and no friction rub.   No murmur heard. Pulmonary/Chest: Effort normal and breath sounds normal. No respiratory distress. He has no wheezes. He has no rales. He exhibits no tenderness.  Abdominal: Soft. Bowel sounds are normal. He exhibits no distension and no mass. There is no tenderness. There is no rebound and no guarding.  Musculoskeletal: Normal range of motion. He exhibits edema (trace B). He exhibits no tenderness.  Lymphadenopathy:    He has no cervical adenopathy.  Neurological: He is alert and oriented to person, place, and time. He has normal reflexes. No cranial nerve deficit. He exhibits normal muscle tone. Coordination normal.  Skin: Skin is warm and dry. No rash noted.    Hyperpigmented ankles  Psychiatric: He has a normal mood and affect. His behavior is normal. Judgment and thought content normal.  B LEs are wraped On O2  Lab Results  Component Value Date   WBC 11.5* 11/26/2013   HGB 10.0*  11/26/2013   HCT 32.2* 11/26/2013   PLT 265 11/26/2013   GLUCOSE 117* 11/26/2013   CHOL 133 04/17/2013   TRIG 103.0 04/17/2013   HDL 42.20 04/17/2013   LDLDIRECT 177.9 06/17/2010   LDLCALC 70 04/17/2013   ALT 12 11/26/2013   AST 14 11/26/2013   NA 144 11/26/2013   K 3.1* 11/28/2013   CL 106 11/26/2013    CREATININE 1.97* 11/26/2013   BUN 19 11/26/2013   CO2 28 11/26/2013   TSH 0.90 08/24/2013   PSA 0.01* 11/18/2011   INR 2.11* 11/28/2013   HGBA1C 5.4 11/26/2013          Assessment & Plan:

## 2013-11-29 NOTE — Assessment & Plan Note (Signed)
Due to CAP and CHF On O2

## 2013-11-29 NOTE — Progress Notes (Signed)
Pre visit review using our clinic review tool, if applicable. No additional management support is needed unless otherwise documented below in the visit note. 

## 2013-11-29 NOTE — Assessment & Plan Note (Signed)
Take Demadex 20 mg tabs 3 a day until gone, then start Demadex 100 mg 1/2 a day if ok. Continue with current prescription therapy as reflected on the Med list.

## 2013-11-29 NOTE — Assessment & Plan Note (Signed)
On O2 Finish abx

## 2013-11-30 DIAGNOSIS — I509 Heart failure, unspecified: Secondary | ICD-10-CM | POA: Diagnosis not present

## 2013-11-30 DIAGNOSIS — I872 Venous insufficiency (chronic) (peripheral): Secondary | ICD-10-CM | POA: Diagnosis not present

## 2013-11-30 DIAGNOSIS — L97809 Non-pressure chronic ulcer of other part of unspecified lower leg with unspecified severity: Secondary | ICD-10-CM | POA: Diagnosis not present

## 2013-11-30 DIAGNOSIS — E119 Type 2 diabetes mellitus without complications: Secondary | ICD-10-CM | POA: Diagnosis not present

## 2013-11-30 DIAGNOSIS — I10 Essential (primary) hypertension: Secondary | ICD-10-CM | POA: Diagnosis not present

## 2013-12-02 LAB — CULTURE, BLOOD (ROUTINE X 2): Culture: NO GROWTH

## 2013-12-03 DIAGNOSIS — I872 Venous insufficiency (chronic) (peripheral): Secondary | ICD-10-CM | POA: Diagnosis not present

## 2013-12-03 DIAGNOSIS — E119 Type 2 diabetes mellitus without complications: Secondary | ICD-10-CM | POA: Diagnosis not present

## 2013-12-03 DIAGNOSIS — L97809 Non-pressure chronic ulcer of other part of unspecified lower leg with unspecified severity: Secondary | ICD-10-CM | POA: Diagnosis not present

## 2013-12-03 DIAGNOSIS — I10 Essential (primary) hypertension: Secondary | ICD-10-CM | POA: Diagnosis not present

## 2013-12-03 DIAGNOSIS — I509 Heart failure, unspecified: Secondary | ICD-10-CM | POA: Diagnosis not present

## 2013-12-05 ENCOUNTER — Ambulatory Visit: Payer: Medicare Other | Admitting: Nurse Practitioner

## 2013-12-05 DIAGNOSIS — Z9981 Dependence on supplemental oxygen: Secondary | ICD-10-CM | POA: Diagnosis not present

## 2013-12-05 DIAGNOSIS — I509 Heart failure, unspecified: Secondary | ICD-10-CM | POA: Diagnosis not present

## 2013-12-05 DIAGNOSIS — E1169 Type 2 diabetes mellitus with other specified complication: Secondary | ICD-10-CM | POA: Diagnosis not present

## 2013-12-05 DIAGNOSIS — L97409 Non-pressure chronic ulcer of unspecified heel and midfoot with unspecified severity: Secondary | ICD-10-CM | POA: Diagnosis not present

## 2013-12-06 ENCOUNTER — Encounter: Payer: Self-pay | Admitting: Physician Assistant

## 2013-12-06 ENCOUNTER — Ambulatory Visit (INDEPENDENT_AMBULATORY_CARE_PROVIDER_SITE_OTHER): Payer: Medicare Other | Admitting: Physician Assistant

## 2013-12-06 ENCOUNTER — Telehealth: Payer: Self-pay | Admitting: *Deleted

## 2013-12-06 ENCOUNTER — Other Ambulatory Visit: Payer: Self-pay | Admitting: *Deleted

## 2013-12-06 VITALS — BP 152/95 | HR 61 | Ht 74.0 in | Wt 220.6 lb

## 2013-12-06 DIAGNOSIS — I5042 Chronic combined systolic (congestive) and diastolic (congestive) heart failure: Secondary | ICD-10-CM

## 2013-12-06 DIAGNOSIS — I10 Essential (primary) hypertension: Secondary | ICD-10-CM

## 2013-12-06 DIAGNOSIS — E785 Hyperlipidemia, unspecified: Secondary | ICD-10-CM

## 2013-12-06 DIAGNOSIS — N179 Acute kidney failure, unspecified: Secondary | ICD-10-CM | POA: Diagnosis not present

## 2013-12-06 DIAGNOSIS — I2510 Atherosclerotic heart disease of native coronary artery without angina pectoris: Secondary | ICD-10-CM

## 2013-12-06 DIAGNOSIS — I5022 Chronic systolic (congestive) heart failure: Secondary | ICD-10-CM

## 2013-12-06 DIAGNOSIS — I4891 Unspecified atrial fibrillation: Secondary | ICD-10-CM | POA: Diagnosis not present

## 2013-12-06 DIAGNOSIS — E876 Hypokalemia: Secondary | ICD-10-CM

## 2013-12-06 DIAGNOSIS — I509 Heart failure, unspecified: Secondary | ICD-10-CM | POA: Diagnosis not present

## 2013-12-06 LAB — BASIC METABOLIC PANEL
Calcium: 8.7 mg/dL (ref 8.4–10.5)
Chloride: 104 mEq/L (ref 96–112)
Creatinine, Ser: 2.1 mg/dL — ABNORMAL HIGH (ref 0.4–1.5)
GFR: 40.74 mL/min — ABNORMAL LOW (ref >60.00)
Glucose, Bld: 76 mg/dL (ref 70–99)

## 2013-12-06 MED ORDER — POTASSIUM CHLORIDE CRYS ER 20 MEQ PO TBCR: EXTENDED_RELEASE_TABLET | ORAL | Status: DC

## 2013-12-06 NOTE — Progress Notes (Signed)
Quick Note:  Patient notified of lab results with K+ being low. Discussed Tereso Newcomer, PA, advisement for patient to increase K+ to 60 mEq in AM and 40 mEq in PM and to repeat BMET in one week. Verbalized agreement with medication change and BMET next 12/23. Also provided education on potassium rich diet and patient stated he would try to incorporate more potassium rich foods. Will come in for repeat BMET on 12/23. ______

## 2013-12-06 NOTE — Patient Instructions (Signed)
Your physician recommends that you schedule a follow-up appointment AS SCHEDULED  Your physician recommends that you HAVE LAB WORK TODAY

## 2013-12-06 NOTE — Telephone Encounter (Signed)
Patient notified of lab results of low K+.  Please result notes with associated changes and future lab order.

## 2013-12-06 NOTE — Progress Notes (Signed)
[address] [place], [place]  [address] Phone: [phone] Fax:  [phone]  Date:  12/06/2013   ID:  Frank, Mclean 01-Jun-1941, MRN [id number]  PCP:  [name], MD  Cardiologist:  Dr. Rollene Rotunda     History of Present Illness: Frank Mclean is a 72 y.o. male with a hx of chronic combined systolic and diastolic CHF, NICM, nonobstructive CAD, AFib, CAD, HTN, HL, PAD. LHC 4/12:  mLAD 25%, mDx 30%, AVCFX 40%, pOM 25%, dRCA 40%. Echocardiogram 05/2013: EF 25-30%, diffuse HK, restrictive physiology, trivial AI, mild MR, mod LAE, reduced RVSF, PASP 42.    Admitted 05/2013 for atrial fibrillation with RVR complicated by type II non-STEMI and a/c combined systolic and diastolic CHF. He was placed on amiodarone at that time with restoration of NSR.   Admitted 08/2013 for a/c renal failure.  He had recently been seen for increasing LE edema and his diuretics were adjusted.  Labs returned with worsening creatinine (2.6=>3.3).  He was felt to be dehydrated and he was gently hydrated with IVFs ("warm and dry") and supported with low dose dopamine.   Creatinine slowly improved and he was eventually d/c to SNF.    He was admitted recently 12/8-12/10 with acute respiratory failure secondary to community acquired pneumonia complicated by decompensated heart failure.  He was treated with antibiotics for his pneumonia. He had poor response to IV Lasix. He was seen by cardiology. Lasix dose was increased and he was transitioned back to Demadex. Labetalol was changed to Coreg.  Since discharge, he has done well. He denies chest pain, syncope, orthopnea, PND. LE edema is improved. His weights at home have been stable. His breathing has improved. He denies any further cough, fever.  His LE wounds are improving, per his report.  Recent Labs: 04/17/2013: LDL (calc) 70  08/24/2013: TSH 0.90  11/26/2013: ALT 12; Creatinine 1.97*; Hemoglobin 10.0*; Pro B Natriuretic peptide (BNP)  5744.0*  11/28/2013: Potassium 3.1*     Wt Readings from Last 3 Encounters:  12/06/13 220 lb 9.6 oz (100.064 kg)  11/29/13 220 lb (99.791 kg)  11/28/13 215 lb 6.4 oz (97.705 kg)     Past Medical History  Diagnosis Date  . Gout   . HTN (hypertension)   . Hyperlipidemia   . Osteoarthritis   . History of CVA (cerebrovascular accident)     Left pontine infarct July 2004; changed from Plavix to Coumadin in 2004 per MD at [facility]  . GERD (gastroesophageal reflux disease)   . DJD (degenerative joint disease)   . BPH (benign prostatic hypertrophy)   . DM (diabetes mellitus), type 2   . Peripheral vascular disease   . Bilateral leg ulcer     ACHILLES AREA--  NONHEALING  . CKD (chronic kidney disease) stage 3, GFR 30-59 ml/min   . CAD (coronary artery disease)     a. LHC 4/12: Mid LAD 25%, mid diagonal 30%, AV circumflex 40%, proximal OM 25%, distal RCA 40%  . Chronic combined systolic and diastolic heart failure     a. Echo 05/2013: EF 25-30%, diffuse HK, restrictive physiology, trivial AI, mild MR, moderate LAE, reduced RV systolic function, PASP 42  . LBBB (left bundle branch block)   . Atrial fibrillation     a. amiodarone Rx started 05/2013;  b. chronic coumadin  . NICM (nonischemic cardiomyopathy)     a. EF 25-30%.    Current Outpatient Prescriptions  Medication Sig Dispense Refill  . amiodarone (  PACERONE) 200 MG tablet Take 1 tablet (200 mg total) by mouth daily.      [name] amLODipine (NORVASC) 10 MG tablet Take 10 mg by mouth daily.      [name] atorvastatin (LIPITOR) 20 MG tablet Take 20 mg by mouth at bedtime.      [name] azithromycin (ZITHROMAX) 500 MG tablet Take 1 tablet (500 mg total) by mouth daily.  5 tablet  0  . budesonide-formoterol (SYMBICORT) 160-4.5 MCG/ACT inhaler Inhale 2 puffs into the lungs daily as needed (shortness of breath).      . carvedilol (COREG) 6.25 MG tablet Take 1 tablet (6.25 mg total) by mouth 2 (two) times daily with a meal.  60 tablet  0  .  cholecalciferol (VITAMIN D) 1000 UNITS tablet Take 1,000 Units by mouth daily.      . collagenase (SANTYL) ointment Apply 1 application topically 2 (two) times a week. On Monday and Friday at home (for wounds on legs) - applied Wednesday at the wound center      . cyanocobalamin 1000 MCG tablet Take 1 tablet (1,000 mcg total) by mouth daily.  100 tablet  3  . ferrous sulfate 325 (65 FE) MG tablet Take 1 tablet (325 mg total) by mouth daily.  30 tablet  6  . glipiZIDE (GLUCOTROL) 5 MG tablet Take 0.5 tablets (2.5 mg total) by mouth 2 (two) times daily before a meal.  30 tablet  3  . hydrALAZINE (APRESOLINE) 50 MG tablet Take 50 mg by mouth 3 (three) times daily.      [name] HYDROcodone-acetaminophen (NORCO/VICODIN) 5-325 MG per tablet Take 1 tablet by mouth 2 (two) times daily as needed for pain.      . isosorbide mononitrate (IMDUR) 60 MG 24 hr tablet Take 1 tablet (60 mg total) by mouth daily.  30 tablet  11  . labetalol (NORMODYNE) 200 MG tablet 200 mg daily.      . metolazone (ZAROXOLYN) 5 MG tablet 5 mg. As directed      . potassium chloride SA (K-DUR,KLOR-CON) 20 MEQ tablet Take 2 tablets (40 mEq total) by mouth 2 (two) times daily.  30 tablet  0  . torsemide (DEMADEX) 20 MG tablet Take 1 tablet (20 mg total) by mouth 3 (three) times daily.  30 tablet  0  . triamcinolone cream (KENALOG) 0.5 % Apply 1 application topically 2 (two) times daily as needed (for skin breakdown).  30 g  0  . warfarin (COUMADIN) 5 MG tablet Take 2.5-5 mg by mouth See admin instructions. Takes 0.5 tablet (2.5mg ) on Monday and Friday. Takes 1 tablet (5mg ) all other days.       No current facility-administered medications for this visit.    Allergies:   No Known Allergies  Social History:  The patient  reports that he quit smoking about 39 years ago. He has never used smokeless tobacco. He reports that he does not drink alcohol or use illicit drugs.    Family History:  [name] family history includes Diabetes in  his mother; Gout in his other; Heart disease in his father and mother; Hypertension in his father and mother; Stroke in his other.   ROS:  Please see the history of present illness.  He denies cough. He denies melena or hematochezia.  All other systems reviewed and negative.   PHYSICAL EXAM: VS:  BP 152/95  Pulse 61  Ht 6\' 2"  (1.88 m)  Wt 220 lb 9.6 oz (100.064 kg)  BMI 28.31 kg/m2 Well  nourished, well developed, in no acute distress HEENT: normal Neck: no JVD at 90 Cardiac:  normal S1, S2; RRR; no murmur; no rub Lungs:  Clear to auscultation bilaterally, no wheezing, rhonchi or rales Abd: soft, nontender, no hepatomegaly Ext: trace bilateral LE edema; legs are wrapped in UNNA boots Skin: warm and dry Neuro:  CNs 2-12 intact, face is symmetrical  EKG:  NSR, HR 61, LBBB     ASSESSMENT AND PLAN:  1. Chronic Combined Systolic and Diastolic CHF:   Volume appears stable. Check follow up basic metabolic panel today. Continue current therapy. I reviewed his case with Dr.  Antoine Poche today. At this point, and given his LE wounds, he would not a candidate for device implantation. We could certainly consider referring him to EP in the future for consideration of CRT +/- ICD.  Dr. Antoine Poche prefers to follow him here instead of the CHF clinic.  2. CAD:  No angina. He is not on aspirin as he is on Coumadin. Continue statin. 3. Atrial Fibrillation:  Maintaining NSR.  He remains on coumadin and Amiodarone.  Recent LFTs and TSH ok.   4. Chronic Kidney Disease:   Check BMET today.   5. Hypertension:  Controlled. 6. Hyperlipidemia:  Continue statin. 7. Lower Extremity Ulcers:  Continue f/u with the wound clinic. 8. Disposition: Follow up with Dr. Rollene Rotunda in 2 weeks as planned.    Signed, Tereso Newcomer, PA-C  12/06/2013 2:44 PM

## 2013-12-07 ENCOUNTER — Telehealth: Payer: Self-pay | Admitting: Physician Assistant

## 2013-12-07 DIAGNOSIS — I509 Heart failure, unspecified: Secondary | ICD-10-CM | POA: Diagnosis not present

## 2013-12-07 DIAGNOSIS — E119 Type 2 diabetes mellitus without complications: Secondary | ICD-10-CM | POA: Diagnosis not present

## 2013-12-07 DIAGNOSIS — I10 Essential (primary) hypertension: Secondary | ICD-10-CM | POA: Diagnosis not present

## 2013-12-07 DIAGNOSIS — I872 Venous insufficiency (chronic) (peripheral): Secondary | ICD-10-CM | POA: Diagnosis not present

## 2013-12-07 DIAGNOSIS — L97809 Non-pressure chronic ulcer of other part of unspecified lower leg with unspecified severity: Secondary | ICD-10-CM | POA: Diagnosis not present

## 2013-12-07 NOTE — Telephone Encounter (Signed)
s/w to pt's wife about the med changes from yesterday to increase K+ to 60 meq AM/40 meq PM, with bmet 12/23. Mrs. Labrosse verbalized understanding to the new instrcutions from yesterday

## 2013-12-07 NOTE — Telephone Encounter (Signed)
New problem    Pt's wife called pt needs some more information and clearification on taking his medication.  Wife asked that she get a call back on her cell phone please.

## 2013-12-10 ENCOUNTER — Other Ambulatory Visit: Payer: Self-pay | Admitting: Internal Medicine

## 2013-12-10 DIAGNOSIS — L97809 Non-pressure chronic ulcer of other part of unspecified lower leg with unspecified severity: Secondary | ICD-10-CM | POA: Diagnosis not present

## 2013-12-10 DIAGNOSIS — I872 Venous insufficiency (chronic) (peripheral): Secondary | ICD-10-CM | POA: Diagnosis not present

## 2013-12-10 DIAGNOSIS — I509 Heart failure, unspecified: Secondary | ICD-10-CM | POA: Diagnosis not present

## 2013-12-10 DIAGNOSIS — E119 Type 2 diabetes mellitus without complications: Secondary | ICD-10-CM | POA: Diagnosis not present

## 2013-12-10 DIAGNOSIS — I10 Essential (primary) hypertension: Secondary | ICD-10-CM | POA: Diagnosis not present

## 2013-12-12 DIAGNOSIS — L97409 Non-pressure chronic ulcer of unspecified heel and midfoot with unspecified severity: Secondary | ICD-10-CM | POA: Diagnosis not present

## 2013-12-12 DIAGNOSIS — I509 Heart failure, unspecified: Secondary | ICD-10-CM | POA: Diagnosis not present

## 2013-12-12 DIAGNOSIS — E1169 Type 2 diabetes mellitus with other specified complication: Secondary | ICD-10-CM | POA: Diagnosis not present

## 2013-12-12 DIAGNOSIS — Z9981 Dependence on supplemental oxygen: Secondary | ICD-10-CM | POA: Diagnosis not present

## 2013-12-14 DIAGNOSIS — I509 Heart failure, unspecified: Secondary | ICD-10-CM | POA: Diagnosis not present

## 2013-12-14 DIAGNOSIS — E119 Type 2 diabetes mellitus without complications: Secondary | ICD-10-CM | POA: Diagnosis not present

## 2013-12-14 DIAGNOSIS — I872 Venous insufficiency (chronic) (peripheral): Secondary | ICD-10-CM | POA: Diagnosis not present

## 2013-12-14 DIAGNOSIS — L97809 Non-pressure chronic ulcer of other part of unspecified lower leg with unspecified severity: Secondary | ICD-10-CM | POA: Diagnosis not present

## 2013-12-14 DIAGNOSIS — I10 Essential (primary) hypertension: Secondary | ICD-10-CM | POA: Diagnosis not present

## 2013-12-14 LAB — GLUCOSE, CAPILLARY: Glucose-Capillary: 60 mg/dL — ABNORMAL LOW (ref 70–99)

## 2013-12-17 DIAGNOSIS — E119 Type 2 diabetes mellitus without complications: Secondary | ICD-10-CM | POA: Diagnosis not present

## 2013-12-17 DIAGNOSIS — L97809 Non-pressure chronic ulcer of other part of unspecified lower leg with unspecified severity: Secondary | ICD-10-CM | POA: Diagnosis not present

## 2013-12-17 DIAGNOSIS — I872 Venous insufficiency (chronic) (peripheral): Secondary | ICD-10-CM | POA: Diagnosis not present

## 2013-12-17 DIAGNOSIS — I509 Heart failure, unspecified: Secondary | ICD-10-CM | POA: Diagnosis not present

## 2013-12-17 DIAGNOSIS — I10 Essential (primary) hypertension: Secondary | ICD-10-CM | POA: Diagnosis not present

## 2013-12-18 ENCOUNTER — Encounter: Payer: Medicare Other | Admitting: Cardiology

## 2013-12-19 ENCOUNTER — Other Ambulatory Visit: Payer: Self-pay | Admitting: Internal Medicine

## 2013-12-19 DIAGNOSIS — Z9981 Dependence on supplemental oxygen: Secondary | ICD-10-CM | POA: Diagnosis not present

## 2013-12-19 DIAGNOSIS — E1169 Type 2 diabetes mellitus with other specified complication: Secondary | ICD-10-CM | POA: Diagnosis not present

## 2013-12-19 DIAGNOSIS — L97409 Non-pressure chronic ulcer of unspecified heel and midfoot with unspecified severity: Secondary | ICD-10-CM | POA: Diagnosis not present

## 2013-12-19 DIAGNOSIS — I509 Heart failure, unspecified: Secondary | ICD-10-CM | POA: Diagnosis not present

## 2013-12-19 LAB — GLUCOSE, CAPILLARY: Glucose-Capillary: 131 mg/dL — ABNORMAL HIGH (ref 70–99)

## 2013-12-19 MED ORDER — AZITHROMYCIN 500 MG PO TABS: ORAL_TABLET | ORAL | Status: DC

## 2013-12-21 DIAGNOSIS — L97809 Non-pressure chronic ulcer of other part of unspecified lower leg with unspecified severity: Secondary | ICD-10-CM | POA: Diagnosis not present

## 2013-12-21 DIAGNOSIS — I10 Essential (primary) hypertension: Secondary | ICD-10-CM | POA: Diagnosis not present

## 2013-12-21 DIAGNOSIS — E119 Type 2 diabetes mellitus without complications: Secondary | ICD-10-CM | POA: Diagnosis not present

## 2013-12-21 DIAGNOSIS — I872 Venous insufficiency (chronic) (peripheral): Secondary | ICD-10-CM | POA: Diagnosis not present

## 2013-12-21 DIAGNOSIS — I509 Heart failure, unspecified: Secondary | ICD-10-CM | POA: Diagnosis not present

## 2013-12-24 DIAGNOSIS — I509 Heart failure, unspecified: Secondary | ICD-10-CM | POA: Diagnosis not present

## 2013-12-24 DIAGNOSIS — L97809 Non-pressure chronic ulcer of other part of unspecified lower leg with unspecified severity: Secondary | ICD-10-CM | POA: Diagnosis not present

## 2013-12-24 DIAGNOSIS — I872 Venous insufficiency (chronic) (peripheral): Secondary | ICD-10-CM | POA: Diagnosis not present

## 2013-12-24 DIAGNOSIS — I10 Essential (primary) hypertension: Secondary | ICD-10-CM | POA: Diagnosis not present

## 2013-12-24 DIAGNOSIS — E119 Type 2 diabetes mellitus without complications: Secondary | ICD-10-CM | POA: Diagnosis not present

## 2013-12-26 ENCOUNTER — Encounter (HOSPITAL_BASED_OUTPATIENT_CLINIC_OR_DEPARTMENT_OTHER): Payer: Medicare Other | Attending: General Surgery

## 2013-12-26 DIAGNOSIS — E1169 Type 2 diabetes mellitus with other specified complication: Secondary | ICD-10-CM | POA: Insufficient documentation

## 2013-12-26 DIAGNOSIS — L97809 Non-pressure chronic ulcer of other part of unspecified lower leg with unspecified severity: Secondary | ICD-10-CM | POA: Insufficient documentation

## 2013-12-27 ENCOUNTER — Other Ambulatory Visit (HOSPITAL_COMMUNITY): Payer: Self-pay | Admitting: Internal Medicine

## 2013-12-27 ENCOUNTER — Ambulatory Visit (INDEPENDENT_AMBULATORY_CARE_PROVIDER_SITE_OTHER): Payer: Medicare Other | Admitting: Physician Assistant

## 2013-12-27 ENCOUNTER — Ambulatory Visit (INDEPENDENT_AMBULATORY_CARE_PROVIDER_SITE_OTHER): Payer: Medicare Other | Admitting: *Deleted

## 2013-12-27 ENCOUNTER — Encounter: Payer: Self-pay | Admitting: Physician Assistant

## 2013-12-27 VITALS — BP 112/72 | HR 55 | Ht 74.0 in | Wt 221.4 lb

## 2013-12-27 DIAGNOSIS — I6350 Cerebral infarction due to unspecified occlusion or stenosis of unspecified cerebral artery: Secondary | ICD-10-CM

## 2013-12-27 DIAGNOSIS — I2510 Atherosclerotic heart disease of native coronary artery without angina pectoris: Secondary | ICD-10-CM | POA: Diagnosis not present

## 2013-12-27 DIAGNOSIS — E785 Hyperlipidemia, unspecified: Secondary | ICD-10-CM

## 2013-12-27 DIAGNOSIS — R0902 Hypoxemia: Secondary | ICD-10-CM

## 2013-12-27 DIAGNOSIS — I5042 Chronic combined systolic (congestive) and diastolic (congestive) heart failure: Secondary | ICD-10-CM | POA: Diagnosis not present

## 2013-12-27 DIAGNOSIS — E1129 Type 2 diabetes mellitus with other diabetic kidney complication: Secondary | ICD-10-CM | POA: Diagnosis not present

## 2013-12-27 DIAGNOSIS — N058 Unspecified nephritic syndrome with other morphologic changes: Secondary | ICD-10-CM

## 2013-12-27 DIAGNOSIS — E538 Deficiency of other specified B group vitamins: Secondary | ICD-10-CM

## 2013-12-27 DIAGNOSIS — N183 Chronic kidney disease, stage 3 unspecified: Secondary | ICD-10-CM

## 2013-12-27 DIAGNOSIS — Z7901 Long term (current) use of anticoagulants: Secondary | ICD-10-CM

## 2013-12-27 DIAGNOSIS — I5022 Chronic systolic (congestive) heart failure: Secondary | ICD-10-CM

## 2013-12-27 DIAGNOSIS — I4891 Unspecified atrial fibrillation: Secondary | ICD-10-CM

## 2013-12-27 DIAGNOSIS — I10 Essential (primary) hypertension: Secondary | ICD-10-CM

## 2013-12-27 DIAGNOSIS — I509 Heart failure, unspecified: Secondary | ICD-10-CM

## 2013-12-27 DIAGNOSIS — J189 Pneumonia, unspecified organism: Secondary | ICD-10-CM

## 2013-12-27 LAB — POCT INR: INR: 1.7

## 2013-12-27 MED ORDER — TORSEMIDE 100 MG PO TABS: 50.0000 mg | ORAL_TABLET | Freq: Two times a day (BID) | ORAL | Status: DC

## 2013-12-27 NOTE — Progress Notes (Signed)
[address] [place], [place]  [address] Phone: [phone] Fax:  [phone]  Date:  12/27/2013   ID:  Frank Mclean 1941/06/07, MRN [id number]  PCP:  [name], MD  Cardiologist:  Dr. Rollene Rotunda     History of Present Illness: Frank Mclean is a 73 y.o. male with a hx of chronic combined systolic and diastolic CHF, NICM, nonobstructive CAD, AFib, CAD, HTN, HL, PAD. LHC 4/12:  mLAD 25%, mDx 30%, AVCFX 40%, pOM 25%, dRCA 40%. Echocardiogram 05/2013: EF 25-30%, diffuse HK, restrictive physiology, trivial AI, mild MR, mod LAE, reduced RVSF, PASP 42.    Admitted 05/2013 for atrial fibrillation with RVR complicated by type II non-STEMI and a/c combined systolic and diastolic CHF. He was placed on amiodarone at that time with restoration of NSR.   Admitted 08/2013 for a/c renal failure.  He had recently been seen for increasing LE edema and his diuretics were adjusted.  Labs returned with worsening creatinine (2.6=>3.3).  He was felt to be dehydrated and he was gently hydrated with IVFs ("warm and dry") and supported with low dose dopamine.   Creatinine slowly improved and he was eventually d/c to SNF.    He was admitted 11/2013 with acute respiratory failure secondary to community acquired pneumonia complicated by decompensated heart failure.    I saw him in follow up last month.  Since then, he has done well.  His LE wounds are almost healed.  He denies significant dyspnea.  His weights are stable.  No chest pain.  No orthopnea, PND.  LE edema is stable.  He occasionally takes extra Torsemide when he has increased weight.  No syncope.    Recent Labs: 04/17/2013: LDL (calc) 70  08/24/2013: TSH 0.90  11/26/2013: ALT 12; Hemoglobin 10.0*; Pro B Natriuretic peptide (BNP) 5744.0*  12/06/2013: Creatinine 2.1*; Potassium 3.3*     Wt Readings from Last 3 Encounters:  12/27/13 221 lb 6.4 oz (100.426 kg)  12/06/13 220 lb 9.6 oz (100.064 kg)  11/29/13 220 lb (99.791  kg)     Past Medical History  Diagnosis Date  . Gout   . HTN (hypertension)   . Hyperlipidemia   . Osteoarthritis   . History of CVA (cerebrovascular accident)     Left pontine infarct July 2004; changed from Plavix to Coumadin in 2004 per MD at [facility]  . GERD (gastroesophageal reflux disease)   . DJD (degenerative joint disease)   . BPH (benign prostatic hypertrophy)   . DM (diabetes mellitus), type 2   . Peripheral vascular disease   . Bilateral leg ulcer     ACHILLES AREA--  NONHEALING  . CKD (chronic kidney disease) stage 3, GFR 30-59 ml/min   . CAD (coronary artery disease)     a. LHC 4/12: Mid LAD 25%, mid diagonal 30%, AV circumflex 40%, proximal OM 25%, distal RCA 40%  . Chronic combined systolic and diastolic heart failure     a. Echo 05/2013: EF 25-30%, diffuse HK, restrictive physiology, trivial AI, mild MR, moderate LAE, reduced RV systolic function, PASP 42  . LBBB (left bundle branch block)   . Atrial fibrillation     a. amiodarone Rx started 05/2013;  b. chronic coumadin  . NICM (nonischemic cardiomyopathy)     a. EF 25-30%.    Current Outpatient Prescriptions  Medication Sig Dispense Refill  . amiodarone (PACERONE) 200 MG tablet Take 1 tablet (200 mg total) by mouth daily.      [name]  amLODipine (NORVASC) 10 MG tablet Take 10 mg by mouth daily.      [name] atorvastatin (LIPITOR) 20 MG tablet Take 20 mg by mouth at bedtime.      [name] azithromycin (ZITHROMAX) 500 MG tablet Pease disregard Zithromax Rx emailed earlier - do not fill. Thank you!  5 tablet  0  . budesonide-formoterol (SYMBICORT) 160-4.5 MCG/ACT inhaler Inhale 2 puffs into the lungs daily as needed (shortness of breath).      . carvedilol (COREG) 6.25 MG tablet Take 1 tablet (6.25 mg total) by mouth 2 (two) times daily with a meal.  60 tablet  0  . cholecalciferol (VITAMIN D) 1000 UNITS tablet Take 1,000 Units by mouth daily.      . collagenase (SANTYL) ointment Apply 1 application topically 2 (two) times a week. On  Monday and Friday at home (for wounds on legs) - applied Wednesday at the wound center      . cyanocobalamin 1000 MCG tablet Take 1 tablet (1,000 mcg total) by mouth daily.  100 tablet  3  . ferrous sulfate 325 (65 FE) MG tablet Take 1 tablet (325 mg total) by mouth daily.  30 tablet  6  . glipiZIDE (GLUCOTROL) 5 MG tablet TAKE 1/2 TABLET BY MOUTH TWICE A DAY BEFORE A MEAL  30 tablet  3  . hydrALAZINE (APRESOLINE) 50 MG tablet Take 50 mg by mouth 3 (three) times daily.      [name] HYDROcodone-acetaminophen (NORCO/VICODIN) 5-325 MG per tablet Take 1 tablet by mouth 2 (two) times daily as needed for pain.      . isosorbide mononitrate (IMDUR) 60 MG 24 hr tablet Take 1 tablet (60 mg total) by mouth daily.  30 tablet  11  . isosorbide mononitrate (IMDUR) 60 MG 24 hr tablet TAKE 1 TABLET (60 MG TOTAL) BY MOUTH DAILY.  30 tablet  5  . metolazone (ZAROXOLYN) 5 MG tablet 5 mg. As directed      . potassium chloride SA (K-DUR,KLOR-CON) 20 MEQ tablet Take 60 mEq potassium chloride SA by mouth each morning and 40 mEq each evening.  150 tablet  3  . torsemide (DEMADEX) 20 MG tablet Take 1 tablet (20 mg total) by mouth 3 (three) times daily.  30 tablet  0  . triamcinolone cream (KENALOG) 0.5 % Apply 1 application topically 2 (two) times daily as needed (for skin breakdown).  30 g  0  . warfarin (COUMADIN) 5 MG tablet Take 2.5-5 mg by mouth See admin instructions. Takes 0.5 tablet (2.5mg ) on Monday and Friday. Takes 1 tablet (5mg ) all other days.       No current facility-administered medications for this visit.    Allergies:   No Known Allergies  Social History:  The patient  reports that he quit smoking about 39 years ago. He has never used smokeless tobacco. He reports that he does not drink alcohol or use illicit drugs.    Family History:  [name] family history includes Diabetes in his mother; Gout in his other; Heart disease in his father and mother; Hypertension in his father and mother; Stroke in his  other.   ROS:  Please see the history of present illness.    All other systems reviewed and negative.   PHYSICAL EXAM: VS:  BP 112/72  Pulse 55  Ht 6\' 2"  (1.88 m)  Wt 221 lb 6.4 oz (100.426 kg)  BMI 28.41 kg/m2 Well nourished, well developed, in no acute distress HEENT: normal Neck: no JVD at 90  Cardiac:  normal S1, S2; RRR; no murmur; no rub Lungs:  Clear to auscultation bilaterally, no wheezing, rhonchi or rales Abd: soft, nontender, no hepatomegaly Ext: trace bilateral LE edema Skin: warm and dry Neuro:  CNs 2-12 intact, face is symmetrical  EKG:  Sinus brady, HR 55, LBBB     ASSESSMENT AND PLAN:  1. Chronic Combined Systolic and Diastolic CHF:   Volume stable.  Continue current Rx. Given his LE wounds, he would not currently be a candidate for device implantation. This can certainly be considered in the future (CRT +/- ICD).   2. CAD:  No angina. He is not on aspirin as he is on Coumadin. Continue statin. 3. Atrial Fibrillation:  Maintaining NSR.  He remains on coumadin and Amiodarone.  Recent LFTs and TSH ok.   4. Chronic Kidney Disease:   Repeat BMET today.    5. Hypertension:  Controlled. 6. Hyperlipidemia:  Continue statin. 7. Lower Extremity Ulcers:  Continue f/u with the wound clinic. 8. Disposition: Follow up with Dr. Rollene RotundaJames Hochrein 2-3 mos.    Signed, Tereso NewcomerScott Tarus Briski, PA-C  12/27/2013 4:15 PM

## 2013-12-27 NOTE — Patient Instructions (Signed)
LAB WORK TODAY; BMET  PLEASE FOLLOW UP WITH DR. HOCHRIEN ON 03/04/14 @ 10:45  Your physician recommends that you continue on your current medications as directed. Please refer to the Current Medication list given to you today.

## 2013-12-28 ENCOUNTER — Telehealth: Payer: Self-pay | Admitting: *Deleted

## 2013-12-28 ENCOUNTER — Ambulatory Visit (INDEPENDENT_AMBULATORY_CARE_PROVIDER_SITE_OTHER): Payer: Medicare Other | Admitting: Internal Medicine

## 2013-12-28 ENCOUNTER — Encounter: Payer: Self-pay | Admitting: Internal Medicine

## 2013-12-28 VITALS — BP 120/84 | HR 64 | Temp 96.1°F | Resp 16 | Wt 218.0 lb

## 2013-12-28 DIAGNOSIS — I10 Essential (primary) hypertension: Secondary | ICD-10-CM

## 2013-12-28 DIAGNOSIS — I509 Heart failure, unspecified: Secondary | ICD-10-CM | POA: Diagnosis not present

## 2013-12-28 DIAGNOSIS — I6350 Cerebral infarction due to unspecified occlusion or stenosis of unspecified cerebral artery: Secondary | ICD-10-CM

## 2013-12-28 DIAGNOSIS — I872 Venous insufficiency (chronic) (peripheral): Secondary | ICD-10-CM | POA: Diagnosis not present

## 2013-12-28 DIAGNOSIS — I5043 Acute on chronic combined systolic (congestive) and diastolic (congestive) heart failure: Secondary | ICD-10-CM

## 2013-12-28 DIAGNOSIS — I2510 Atherosclerotic heart disease of native coronary artery without angina pectoris: Secondary | ICD-10-CM | POA: Diagnosis not present

## 2013-12-28 DIAGNOSIS — E119 Type 2 diabetes mellitus without complications: Secondary | ICD-10-CM | POA: Diagnosis not present

## 2013-12-28 DIAGNOSIS — L97809 Non-pressure chronic ulcer of other part of unspecified lower leg with unspecified severity: Secondary | ICD-10-CM | POA: Diagnosis not present

## 2013-12-28 DIAGNOSIS — R269 Unspecified abnormalities of gait and mobility: Secondary | ICD-10-CM | POA: Diagnosis not present

## 2013-12-28 LAB — BASIC METABOLIC PANEL
BUN: 26 mg/dL — ABNORMAL HIGH (ref 6–23)
CO2: 32 mEq/L (ref 19–32)
CREATININE: 2.4 mg/dL — AB (ref 0.4–1.5)
Calcium: 8.7 mg/dL (ref 8.4–10.5)
Chloride: 109 mEq/L (ref 96–112)
GFR: 34.65 mL/min — ABNORMAL LOW (ref >60.00)
Glucose, Bld: 74 mg/dL (ref 70–99)
Potassium: 3.1 mEq/L — ABNORMAL LOW (ref 3.5–5.1)
Sodium: 147 mEq/L — ABNORMAL HIGH (ref 135–145)

## 2013-12-28 MED ORDER — HYDROCODONE-ACETAMINOPHEN 5-325 MG PO TABS: 1.0000 | ORAL_TABLET | Freq: Two times a day (BID) | ORAL | Status: DC | PRN

## 2013-12-28 MED ORDER — TORSEMIDE 20 MG PO TABS: 40.0000 mg | ORAL_TABLET | Freq: Two times a day (BID) | ORAL | Status: DC

## 2013-12-28 NOTE — Assessment & Plan Note (Signed)
Continue with current prescription therapy as reflected on the Med list.  

## 2013-12-28 NOTE — Progress Notes (Signed)
Subjective:    HPI   F/u  73 y.o. male w/ PMHx s/f chronic combined CHF, nonischemic cardiomyoapthy, nonobstructive CAD, PAF (amiodarone and Coumadin), chronic LBBB, DM2, HTN, HLD, PAD, CKD (stage III, baseline Cr 1.9), h/o CVA and chronic venous ulcers who was admitted to [facility] for CAP and decompensated heart failure.  1. Acute on chronic combined CHF/NICM, EF 25-30%  2. Nonobstructive CAD  3. PAF  4. Subtherapeutic INR now therapeutic  5. Type 2 DM  6. HTN  7. HLD  8. H/o CVA  9. CKD, stage III  10. Chronic venous ulcers  11. Hypokalemia  12. PAD  13. Chronic LBBB  14. CAP - L lung Patient improved and was discharged on 12/10.    F/u: Near syncope  Active Problems:  DM (diabetes mellitus), type 2 with renal complications  HYPERLIPIDEMIA  HYPERTENSION  Acute on chronic combined systolic and diastolic congestive heart failure  CVA  CKD (chronic kidney disease) stage 3, GFR 30-59 ml/min  Wound, open, leg  Atherosclerotic peripheral vascular disease with intermittent claudication  Orthostatic hypotension  Hypokalemia  Elevated troponin  Atrial fibrillation with RVR  Wt Readings from Last 3 Encounters:  12/28/13 218 lb (98.884 kg)  12/27/13 221 lb 6.4 oz (100.426 kg)  12/06/13 220 lb 9.6 oz (100.064 kg)   BP Readings from Last 3 Encounters:  12/28/13 120/84  12/27/13 112/72  12/06/13 152/95      Review of Systems  Constitutional: Positive for fatigue. Negative for appetite change and unexpected weight change.  HENT: Negative for congestion, nosebleeds, sneezing and trouble swallowing.   Eyes: Negative for itching and visual disturbance.  Cardiovascular: Positive for leg swelling. Negative for palpitations.  Gastrointestinal: Negative for nausea, diarrhea, blood in stool and abdominal distention.  Genitourinary: Negative for frequency and hematuria.  Musculoskeletal: Positive for arthralgias. Negative for back pain, gait problem, joint swelling and  neck pain.  Neurological: Negative for dizziness, tremors, speech difficulty and weakness.  Psychiatric/Behavioral: Negative for sleep disturbance, dysphoric mood and agitation. The patient is nervous/anxious.        Objective:   Physical Exam  Constitutional: He is oriented to person, place, and time. He appears well-developed.  HENT:  Mouth/Throat: Oropharynx is clear and moist.  Eyes: Conjunctivae are normal. Pupils are equal, round, and reactive to light.  Neck: Normal range of motion. No JVD present. No thyromegaly present.  Cardiovascular: Normal rate, regular rhythm, normal heart sounds and intact distal pulses.  Exam reveals no gallop and no friction rub.   No murmur heard. Pulmonary/Chest: Effort normal and breath sounds normal. No respiratory distress. He has no wheezes. He has no rales. He exhibits no tenderness.  Abdominal: Soft. Bowel sounds are normal. He exhibits no distension and no mass. There is no tenderness. There is no rebound and no guarding.  Musculoskeletal: Normal range of motion. He exhibits edema. He exhibits no tenderness.  L trace to 1+ R 2+  Lymphadenopathy:    He has no cervical adenopathy.  Neurological: He is alert and oriented to person, place, and time. He has normal reflexes. No cranial nerve deficit. He exhibits normal muscle tone. Coordination normal.  Skin: Skin is warm and dry. No rash noted.    Hyperpigmented ankles  Psychiatric: He has a normal mood and affect. His behavior is normal. Judgment and thought content normal.  B LEs are wraped On O2  Lab Results  Component Value Date   WBC 11.5* 11/26/2013   HGB 10.0* 11/26/2013  HCT 32.2* 11/26/2013   PLT 265 11/26/2013   GLUCOSE 76 12/06/2013   CHOL 133 04/17/2013   TRIG 103.0 04/17/2013   HDL 42.20 04/17/2013   LDLDIRECT 177.9 06/17/2010   LDLCALC 70 04/17/2013   ALT 12 11/26/2013   AST 14 11/26/2013   NA 144 12/06/2013   K 3.3* 12/06/2013   CL 104 12/06/2013   CREATININE 2.1* 12/06/2013    BUN 18 12/06/2013   CO2 32 12/06/2013   TSH 0.90 08/24/2013   PSA 0.01* 11/18/2011   INR 1.7 12/27/2013   HGBA1C 5.4 11/26/2013          Assessment & Plan:

## 2013-12-28 NOTE — Assessment & Plan Note (Signed)
Doing well Continue with current prescription therapy as reflected on the Med list.  

## 2013-12-28 NOTE — Progress Notes (Signed)
Pre visit review using our clinic review tool, if applicable. No additional management support is needed unless otherwise documented below in the visit note. 

## 2013-12-28 NOTE — Assessment & Plan Note (Signed)
Risks associated with treatment noncompliance w/legs elevationswere discussed. Compliance was encouraged.

## 2013-12-28 NOTE — Telephone Encounter (Signed)
pt's wife notified about lab results and to decrease torsemide to 40 mg bid, increase K+ 60 meq bid, bmet 1/15.Marland Kitchenwife verbalized understanding to Plan of Care, rx for torsemide called in

## 2013-12-29 ENCOUNTER — Other Ambulatory Visit (HOSPITAL_COMMUNITY): Payer: Self-pay | Admitting: Internal Medicine

## 2013-12-29 ENCOUNTER — Telehealth: Payer: Self-pay | Admitting: Physician Assistant

## 2013-12-29 NOTE — Telephone Encounter (Signed)
Frank Mclean was seen in the office on 1/8 and his labs were checked. His potassium was 3.1, BUN 26 and creatinine 2.4. His torsemide dose was decreased to 40 mg twice a day. However, his weight was up 3 pounds today and his family was to know what to do. His potassium had been 20 mEq x5 daily and it was increased to 6 daily.  For today only, will give torsemide 50 mg twice a day. Additionally, he will get 2 more potassium tablets today. Will contact the office on Monday to get blood work Monday or Tuesday instead of Thursday. In the meantime, she is to call back if his weight continues to increase.

## 2013-12-31 DIAGNOSIS — I10 Essential (primary) hypertension: Secondary | ICD-10-CM | POA: Diagnosis not present

## 2013-12-31 DIAGNOSIS — E119 Type 2 diabetes mellitus without complications: Secondary | ICD-10-CM | POA: Diagnosis not present

## 2013-12-31 DIAGNOSIS — L97809 Non-pressure chronic ulcer of other part of unspecified lower leg with unspecified severity: Secondary | ICD-10-CM | POA: Diagnosis not present

## 2013-12-31 DIAGNOSIS — I872 Venous insufficiency (chronic) (peripheral): Secondary | ICD-10-CM | POA: Diagnosis not present

## 2013-12-31 DIAGNOSIS — I509 Heart failure, unspecified: Secondary | ICD-10-CM | POA: Diagnosis not present

## 2014-01-01 ENCOUNTER — Other Ambulatory Visit (HOSPITAL_COMMUNITY): Payer: Self-pay | Admitting: Internal Medicine

## 2014-01-01 NOTE — Telephone Encounter (Signed)
Refill done.  

## 2014-01-02 DIAGNOSIS — L97809 Non-pressure chronic ulcer of other part of unspecified lower leg with unspecified severity: Secondary | ICD-10-CM | POA: Diagnosis not present

## 2014-01-02 DIAGNOSIS — E1169 Type 2 diabetes mellitus with other specified complication: Secondary | ICD-10-CM | POA: Diagnosis not present

## 2014-01-02 LAB — GLUCOSE, CAPILLARY: Glucose-Capillary: 123 mg/dL — ABNORMAL HIGH (ref 70–99)

## 2014-01-04 DIAGNOSIS — I872 Venous insufficiency (chronic) (peripheral): Secondary | ICD-10-CM | POA: Diagnosis not present

## 2014-01-04 DIAGNOSIS — E119 Type 2 diabetes mellitus without complications: Secondary | ICD-10-CM | POA: Diagnosis not present

## 2014-01-04 DIAGNOSIS — I10 Essential (primary) hypertension: Secondary | ICD-10-CM | POA: Diagnosis not present

## 2014-01-04 DIAGNOSIS — I509 Heart failure, unspecified: Secondary | ICD-10-CM | POA: Diagnosis not present

## 2014-01-04 DIAGNOSIS — L97809 Non-pressure chronic ulcer of other part of unspecified lower leg with unspecified severity: Secondary | ICD-10-CM | POA: Diagnosis not present

## 2014-01-06 DIAGNOSIS — I872 Venous insufficiency (chronic) (peripheral): Secondary | ICD-10-CM | POA: Diagnosis not present

## 2014-01-06 DIAGNOSIS — E119 Type 2 diabetes mellitus without complications: Secondary | ICD-10-CM | POA: Diagnosis not present

## 2014-01-06 DIAGNOSIS — I509 Heart failure, unspecified: Secondary | ICD-10-CM | POA: Diagnosis not present

## 2014-01-06 DIAGNOSIS — I10 Essential (primary) hypertension: Secondary | ICD-10-CM | POA: Diagnosis not present

## 2014-01-06 DIAGNOSIS — L97809 Non-pressure chronic ulcer of other part of unspecified lower leg with unspecified severity: Secondary | ICD-10-CM | POA: Diagnosis not present

## 2014-01-07 DIAGNOSIS — I872 Venous insufficiency (chronic) (peripheral): Secondary | ICD-10-CM | POA: Diagnosis not present

## 2014-01-07 DIAGNOSIS — I509 Heart failure, unspecified: Secondary | ICD-10-CM | POA: Diagnosis not present

## 2014-01-07 DIAGNOSIS — E119 Type 2 diabetes mellitus without complications: Secondary | ICD-10-CM | POA: Diagnosis not present

## 2014-01-07 DIAGNOSIS — I10 Essential (primary) hypertension: Secondary | ICD-10-CM | POA: Diagnosis not present

## 2014-01-07 DIAGNOSIS — L97809 Non-pressure chronic ulcer of other part of unspecified lower leg with unspecified severity: Secondary | ICD-10-CM | POA: Diagnosis not present

## 2014-01-09 DIAGNOSIS — L97809 Non-pressure chronic ulcer of other part of unspecified lower leg with unspecified severity: Secondary | ICD-10-CM | POA: Diagnosis not present

## 2014-01-09 DIAGNOSIS — E1169 Type 2 diabetes mellitus with other specified complication: Secondary | ICD-10-CM | POA: Diagnosis not present

## 2014-01-11 DIAGNOSIS — I872 Venous insufficiency (chronic) (peripheral): Secondary | ICD-10-CM | POA: Diagnosis not present

## 2014-01-11 DIAGNOSIS — I509 Heart failure, unspecified: Secondary | ICD-10-CM | POA: Diagnosis not present

## 2014-01-11 DIAGNOSIS — L97809 Non-pressure chronic ulcer of other part of unspecified lower leg with unspecified severity: Secondary | ICD-10-CM | POA: Diagnosis not present

## 2014-01-11 DIAGNOSIS — I10 Essential (primary) hypertension: Secondary | ICD-10-CM | POA: Diagnosis not present

## 2014-01-11 DIAGNOSIS — E119 Type 2 diabetes mellitus without complications: Secondary | ICD-10-CM | POA: Diagnosis not present

## 2014-01-14 DIAGNOSIS — I10 Essential (primary) hypertension: Secondary | ICD-10-CM | POA: Diagnosis not present

## 2014-01-14 DIAGNOSIS — I509 Heart failure, unspecified: Secondary | ICD-10-CM | POA: Diagnosis not present

## 2014-01-14 DIAGNOSIS — L97809 Non-pressure chronic ulcer of other part of unspecified lower leg with unspecified severity: Secondary | ICD-10-CM | POA: Diagnosis not present

## 2014-01-14 DIAGNOSIS — I872 Venous insufficiency (chronic) (peripheral): Secondary | ICD-10-CM | POA: Diagnosis not present

## 2014-01-14 DIAGNOSIS — E119 Type 2 diabetes mellitus without complications: Secondary | ICD-10-CM | POA: Diagnosis not present

## 2014-01-16 DIAGNOSIS — E1169 Type 2 diabetes mellitus with other specified complication: Secondary | ICD-10-CM | POA: Diagnosis not present

## 2014-01-16 DIAGNOSIS — L97809 Non-pressure chronic ulcer of other part of unspecified lower leg with unspecified severity: Secondary | ICD-10-CM | POA: Diagnosis not present

## 2014-01-16 LAB — GLUCOSE, CAPILLARY: GLUCOSE-CAPILLARY: 114 mg/dL — AB (ref 70–99)

## 2014-01-23 ENCOUNTER — Encounter (HOSPITAL_BASED_OUTPATIENT_CLINIC_OR_DEPARTMENT_OTHER): Payer: Medicare Other | Attending: General Surgery

## 2014-01-23 DIAGNOSIS — L97809 Non-pressure chronic ulcer of other part of unspecified lower leg with unspecified severity: Secondary | ICD-10-CM | POA: Insufficient documentation

## 2014-01-23 DIAGNOSIS — L97409 Non-pressure chronic ulcer of unspecified heel and midfoot with unspecified severity: Secondary | ICD-10-CM | POA: Diagnosis not present

## 2014-01-23 DIAGNOSIS — E1169 Type 2 diabetes mellitus with other specified complication: Secondary | ICD-10-CM | POA: Insufficient documentation

## 2014-01-23 LAB — GLUCOSE, CAPILLARY: GLUCOSE-CAPILLARY: 83 mg/dL (ref 70–99)

## 2014-01-25 DIAGNOSIS — I10 Essential (primary) hypertension: Secondary | ICD-10-CM | POA: Diagnosis not present

## 2014-01-25 DIAGNOSIS — I509 Heart failure, unspecified: Secondary | ICD-10-CM | POA: Diagnosis not present

## 2014-01-25 DIAGNOSIS — E119 Type 2 diabetes mellitus without complications: Secondary | ICD-10-CM | POA: Diagnosis not present

## 2014-01-25 DIAGNOSIS — L97809 Non-pressure chronic ulcer of other part of unspecified lower leg with unspecified severity: Secondary | ICD-10-CM | POA: Diagnosis not present

## 2014-01-25 DIAGNOSIS — I872 Venous insufficiency (chronic) (peripheral): Secondary | ICD-10-CM | POA: Diagnosis not present

## 2014-01-28 DIAGNOSIS — L97809 Non-pressure chronic ulcer of other part of unspecified lower leg with unspecified severity: Secondary | ICD-10-CM | POA: Diagnosis not present

## 2014-01-28 DIAGNOSIS — I509 Heart failure, unspecified: Secondary | ICD-10-CM | POA: Diagnosis not present

## 2014-01-28 DIAGNOSIS — I10 Essential (primary) hypertension: Secondary | ICD-10-CM | POA: Diagnosis not present

## 2014-01-28 DIAGNOSIS — I872 Venous insufficiency (chronic) (peripheral): Secondary | ICD-10-CM | POA: Diagnosis not present

## 2014-01-28 DIAGNOSIS — E119 Type 2 diabetes mellitus without complications: Secondary | ICD-10-CM | POA: Diagnosis not present

## 2014-01-30 ENCOUNTER — Ambulatory Visit (INDEPENDENT_AMBULATORY_CARE_PROVIDER_SITE_OTHER): Payer: Medicare Other | Admitting: Pharmacist

## 2014-01-30 DIAGNOSIS — I6350 Cerebral infarction due to unspecified occlusion or stenosis of unspecified cerebral artery: Secondary | ICD-10-CM

## 2014-01-30 DIAGNOSIS — L97409 Non-pressure chronic ulcer of unspecified heel and midfoot with unspecified severity: Secondary | ICD-10-CM | POA: Diagnosis not present

## 2014-01-30 DIAGNOSIS — E1169 Type 2 diabetes mellitus with other specified complication: Secondary | ICD-10-CM | POA: Diagnosis not present

## 2014-01-30 DIAGNOSIS — L97809 Non-pressure chronic ulcer of other part of unspecified lower leg with unspecified severity: Secondary | ICD-10-CM | POA: Diagnosis not present

## 2014-01-30 DIAGNOSIS — Z7901 Long term (current) use of anticoagulants: Secondary | ICD-10-CM | POA: Diagnosis not present

## 2014-01-30 LAB — GLUCOSE, CAPILLARY: Glucose-Capillary: 96 mg/dL (ref 70–99)

## 2014-01-30 LAB — POCT INR: INR: 2.1

## 2014-02-08 ENCOUNTER — Other Ambulatory Visit: Payer: Self-pay | Admitting: Internal Medicine

## 2014-02-08 ENCOUNTER — Other Ambulatory Visit (INDEPENDENT_AMBULATORY_CARE_PROVIDER_SITE_OTHER): Payer: Medicare Other

## 2014-02-08 ENCOUNTER — Telehealth: Payer: Self-pay | Admitting: Cardiology

## 2014-02-08 DIAGNOSIS — R0602 Shortness of breath: Secondary | ICD-10-CM

## 2014-02-08 DIAGNOSIS — I509 Heart failure, unspecified: Secondary | ICD-10-CM | POA: Diagnosis not present

## 2014-02-08 DIAGNOSIS — I5043 Acute on chronic combined systolic (congestive) and diastolic (congestive) heart failure: Secondary | ICD-10-CM

## 2014-02-08 DIAGNOSIS — N289 Disorder of kidney and ureter, unspecified: Secondary | ICD-10-CM

## 2014-02-08 DIAGNOSIS — I10 Essential (primary) hypertension: Secondary | ICD-10-CM

## 2014-02-08 DIAGNOSIS — E876 Hypokalemia: Secondary | ICD-10-CM

## 2014-02-08 LAB — BASIC METABOLIC PANEL
BUN: 20 mg/dL (ref 6–23)
CO2: 34 mEq/L — ABNORMAL HIGH (ref 19–32)
Calcium: 9.2 mg/dL (ref 8.4–10.5)
Chloride: 105 mEq/L (ref 96–112)
Creatinine, Ser: 2.1 mg/dL — ABNORMAL HIGH (ref 0.4–1.5)
GFR: 40.72 mL/min — ABNORMAL LOW (ref >60.00)
GLUCOSE: 63 mg/dL — AB (ref 70–99)
Potassium: 3.2 mEq/L — ABNORMAL LOW (ref 3.5–5.1)
Sodium: 146 mEq/L — ABNORMAL HIGH (ref 135–145)

## 2014-02-08 NOTE — Telephone Encounter (Signed)
New message      Pt has CHF.  He recently had pneumonia.  He is not coughing again.  Is it CHF or pneumonia again?

## 2014-02-08 NOTE — Telephone Encounter (Signed)
Per wife - pt is having a dry non productive cough for the past 2 days that seems to be getting worse.  Yesterday his wt was up 3 lbs and she had him take extra 20 mg of Torsemide (total 100 mg yesterday) and also an extra potassium.  His weight is down today. She can not tell for sure about any edema in legs as they are wrapped d/t wounds.  He is keeping them elevated and she doesn't think he has any edema.  He has no fever and no cold s/s.  Aware I will discuss with Dr Antoine Poche and call her back.

## 2014-02-08 NOTE — Telephone Encounter (Signed)
Check a BMET and BNP.  I might also suggest a follow up with [name], MD

## 2014-02-08 NOTE — Telephone Encounter (Signed)
Wife aware and will bring pt in today for bloodwork

## 2014-02-11 ENCOUNTER — Ambulatory Visit: Payer: Medicare Other | Admitting: Internal Medicine

## 2014-02-11 DIAGNOSIS — I509 Heart failure, unspecified: Secondary | ICD-10-CM | POA: Diagnosis not present

## 2014-02-11 DIAGNOSIS — E119 Type 2 diabetes mellitus without complications: Secondary | ICD-10-CM | POA: Diagnosis not present

## 2014-02-11 DIAGNOSIS — I10 Essential (primary) hypertension: Secondary | ICD-10-CM | POA: Diagnosis not present

## 2014-02-11 DIAGNOSIS — I872 Venous insufficiency (chronic) (peripheral): Secondary | ICD-10-CM | POA: Diagnosis not present

## 2014-02-11 DIAGNOSIS — L97809 Non-pressure chronic ulcer of other part of unspecified lower leg with unspecified severity: Secondary | ICD-10-CM | POA: Diagnosis not present

## 2014-02-12 ENCOUNTER — Other Ambulatory Visit: Payer: Self-pay | Admitting: *Deleted

## 2014-02-12 DIAGNOSIS — E876 Hypokalemia: Secondary | ICD-10-CM

## 2014-02-13 DIAGNOSIS — E1169 Type 2 diabetes mellitus with other specified complication: Secondary | ICD-10-CM | POA: Diagnosis not present

## 2014-02-13 DIAGNOSIS — L97409 Non-pressure chronic ulcer of unspecified heel and midfoot with unspecified severity: Secondary | ICD-10-CM | POA: Diagnosis not present

## 2014-02-13 DIAGNOSIS — L97809 Non-pressure chronic ulcer of other part of unspecified lower leg with unspecified severity: Secondary | ICD-10-CM | POA: Diagnosis not present

## 2014-02-15 DIAGNOSIS — E119 Type 2 diabetes mellitus without complications: Secondary | ICD-10-CM | POA: Diagnosis not present

## 2014-02-15 DIAGNOSIS — L97809 Non-pressure chronic ulcer of other part of unspecified lower leg with unspecified severity: Secondary | ICD-10-CM | POA: Diagnosis not present

## 2014-02-15 DIAGNOSIS — I509 Heart failure, unspecified: Secondary | ICD-10-CM | POA: Diagnosis not present

## 2014-02-15 DIAGNOSIS — I10 Essential (primary) hypertension: Secondary | ICD-10-CM | POA: Diagnosis not present

## 2014-02-15 DIAGNOSIS — I872 Venous insufficiency (chronic) (peripheral): Secondary | ICD-10-CM | POA: Diagnosis not present

## 2014-02-18 ENCOUNTER — Ambulatory Visit (INDEPENDENT_AMBULATORY_CARE_PROVIDER_SITE_OTHER): Payer: Medicare Other | Admitting: Internal Medicine

## 2014-02-18 ENCOUNTER — Encounter: Payer: Self-pay | Admitting: Internal Medicine

## 2014-02-18 VITALS — BP 132/88 | HR 56 | Temp 96.9°F | Wt 218.0 lb

## 2014-02-18 DIAGNOSIS — E538 Deficiency of other specified B group vitamins: Secondary | ICD-10-CM

## 2014-02-18 DIAGNOSIS — I6350 Cerebral infarction due to unspecified occlusion or stenosis of unspecified cerebral artery: Secondary | ICD-10-CM | POA: Diagnosis not present

## 2014-02-18 DIAGNOSIS — M109 Gout, unspecified: Secondary | ICD-10-CM | POA: Diagnosis not present

## 2014-02-18 DIAGNOSIS — I10 Essential (primary) hypertension: Secondary | ICD-10-CM | POA: Diagnosis not present

## 2014-02-18 DIAGNOSIS — L97809 Non-pressure chronic ulcer of other part of unspecified lower leg with unspecified severity: Secondary | ICD-10-CM | POA: Diagnosis not present

## 2014-02-18 DIAGNOSIS — I509 Heart failure, unspecified: Secondary | ICD-10-CM | POA: Diagnosis not present

## 2014-02-18 DIAGNOSIS — J449 Chronic obstructive pulmonary disease, unspecified: Secondary | ICD-10-CM | POA: Diagnosis not present

## 2014-02-18 DIAGNOSIS — E119 Type 2 diabetes mellitus without complications: Secondary | ICD-10-CM | POA: Diagnosis not present

## 2014-02-18 DIAGNOSIS — IMO0001 Reserved for inherently not codable concepts without codable children: Secondary | ICD-10-CM

## 2014-02-18 DIAGNOSIS — I2510 Atherosclerotic heart disease of native coronary artery without angina pectoris: Secondary | ICD-10-CM | POA: Diagnosis not present

## 2014-02-18 DIAGNOSIS — I5022 Chronic systolic (congestive) heart failure: Secondary | ICD-10-CM

## 2014-02-18 DIAGNOSIS — J189 Pneumonia, unspecified organism: Secondary | ICD-10-CM

## 2014-02-18 DIAGNOSIS — I872 Venous insufficiency (chronic) (peripheral): Secondary | ICD-10-CM | POA: Diagnosis not present

## 2014-02-18 NOTE — Assessment & Plan Note (Signed)
Continue with current prescription therapy as reflected on the Med list.  

## 2014-02-18 NOTE — Progress Notes (Signed)
Pre visit review using our clinic review tool, if applicable. No additional management support is needed unless otherwise documented below in the visit note. 

## 2014-02-18 NOTE — Assessment & Plan Note (Signed)
Continue with current prescription therapy as reflected on the Med list and O2

## 2014-02-18 NOTE — Assessment & Plan Note (Signed)
On O2 at night 

## 2014-02-18 NOTE — Progress Notes (Signed)
Patient ID: Frank Mclean, male   DOB: 08/04/1941, 73 y.o.   MRN: [id number]   Subjective:    HPI   F/u  73 y.o. male w/ PMHx s/f chronic combined CHF, nonischemic cardiomyoapthy, nonobstructive CAD, PAF (amiodarone and Coumadin), chronic LBBB, DM2, HTN, HLD, PAD, CKD (stage III, baseline Cr 1.9), h/o CVA and chronic venous ulcers who was [facility] for CAP and decompensated heart failure.  1. Acute on chronic combined CHF/NICM, EF 25-30%  2. Nonobstructive CAD  3. PAF  4. Subtherapeutic INR now therapeutic  5. Type 2 DM  6. HTN  7. HLD  8. H/o CVA  9. CKD, stage III  10. Chronic venous ulcers  11. Hypokalemia  12. PAD  13. Chronic LBBB  14. CAP - L lung Patient improved and was discharged on 12/10.    F/u: Near syncope  Active Problems:  DM (diabetes mellitus), type 2 with renal complications  HYPERLIPIDEMIA  HYPERTENSION  Acute on chronic combined systolic and diastolic congestive heart failure  CVA  CKD (chronic kidney disease) stage 3, GFR 30-59 ml/min  Wound, open, leg  Atherosclerotic peripheral vascular disease with intermittent claudication  Orthostatic hypotension  Hypokalemia  Elevated troponin  Atrial fibrillation with RVR  Wt Readings from Last 3 Encounters:  02/18/14 218 lb (98.884 kg)  12/28/13 218 lb (98.884 kg)  12/27/13 221 lb 6.4 oz (100.426 kg)   BP Readings from Last 3 Encounters:  02/18/14 132/88  12/28/13 120/84  12/27/13 112/72      Review of Systems  Constitutional: Positive for fatigue. Negative for appetite change and unexpected weight change.  HENT: Negative for congestion, nosebleeds, sneezing and trouble swallowing.   Eyes: Negative for itching and visual disturbance.  Cardiovascular: Positive for leg swelling. Negative for palpitations.  Gastrointestinal: Negative for nausea, diarrhea, blood in stool and abdominal distention.  Genitourinary: Negative for frequency and hematuria.  Musculoskeletal: Positive for  arthralgias. Negative for back pain, gait problem, joint swelling and neck pain.  Neurological: Negative for dizziness, tremors, speech difficulty and weakness.  Psychiatric/Behavioral: Negative for sleep disturbance, dysphoric mood and agitation. The patient is nervous/anxious.        Objective:   Physical Exam  Constitutional: He is oriented to person, place, and time. He appears well-developed.  HENT:  Mouth/Throat: Oropharynx is clear and moist.  Eyes: Conjunctivae are normal. Pupils are equal, round, and reactive to light.  Neck: Normal range of motion. No JVD present. No thyromegaly present.  Cardiovascular: Normal rate, regular rhythm, normal heart sounds and intact distal pulses.  Exam reveals no gallop and no friction rub.   No murmur heard. Pulmonary/Chest: Effort normal and breath sounds normal. No respiratory distress. He has no wheezes. He has no rales. He exhibits no tenderness.  Abdominal: Soft. Bowel sounds are normal. He exhibits no distension and no mass. There is no tenderness. There is no rebound and no guarding.  Musculoskeletal: Normal range of motion. He exhibits edema. He exhibits no tenderness.  L trace to 1+ R 1+  Lymphadenopathy:    He has no cervical adenopathy.  Neurological: He is alert and oriented to person, place, and time. He has normal reflexes. No cranial nerve deficit. He exhibits normal muscle tone. Coordination normal.  Skin: Skin is warm and dry. No rash noted.    Hyperpigmented ankles  Psychiatric: He has a normal mood and affect. His behavior is normal. Judgment and thought content normal.  B LEs are wraped On O2  Lab Results  Component Value Date   WBC 11.5* 11/26/2013   HGB 10.0* 11/26/2013   HCT 32.2* 11/26/2013   PLT 265 11/26/2013   GLUCOSE 63* 02/08/2014   CHOL 133 04/17/2013   TRIG 103.0 04/17/2013   HDL 42.20 04/17/2013   LDLDIRECT 177.9 06/17/2010   LDLCALC 70 04/17/2013   ALT 12 11/26/2013   AST 14 11/26/2013   NA 146* 02/08/2014    K 3.2* 02/08/2014   CL 105 02/08/2014   CREATININE 2.1* 02/08/2014   BUN 20 02/08/2014   CO2 34* 02/08/2014   TSH 0.90 08/24/2013   PSA 0.01* 11/18/2011   INR 2.1 01/30/2014   HGBA1C 5.4 11/26/2013          Assessment & Plan:

## 2014-02-18 NOTE — Assessment & Plan Note (Signed)
No relapse 

## 2014-02-19 ENCOUNTER — Telehealth: Payer: Self-pay | Admitting: Internal Medicine

## 2014-02-19 NOTE — Telephone Encounter (Signed)
Relevant patient education assigned to patient using Emmi. ° °

## 2014-02-20 ENCOUNTER — Encounter (HOSPITAL_BASED_OUTPATIENT_CLINIC_OR_DEPARTMENT_OTHER): Payer: Medicare Other | Attending: General Surgery

## 2014-02-20 DIAGNOSIS — E1169 Type 2 diabetes mellitus with other specified complication: Secondary | ICD-10-CM | POA: Insufficient documentation

## 2014-02-20 DIAGNOSIS — L97309 Non-pressure chronic ulcer of unspecified ankle with unspecified severity: Secondary | ICD-10-CM | POA: Diagnosis not present

## 2014-02-20 LAB — GLUCOSE, CAPILLARY: Glucose-Capillary: 110 mg/dL — ABNORMAL HIGH (ref 70–99)

## 2014-02-21 ENCOUNTER — Other Ambulatory Visit: Payer: Medicare Other

## 2014-02-22 DIAGNOSIS — I10 Essential (primary) hypertension: Secondary | ICD-10-CM | POA: Diagnosis not present

## 2014-02-22 DIAGNOSIS — E119 Type 2 diabetes mellitus without complications: Secondary | ICD-10-CM | POA: Diagnosis not present

## 2014-02-22 DIAGNOSIS — I509 Heart failure, unspecified: Secondary | ICD-10-CM | POA: Diagnosis not present

## 2014-02-22 DIAGNOSIS — L97809 Non-pressure chronic ulcer of other part of unspecified lower leg with unspecified severity: Secondary | ICD-10-CM | POA: Diagnosis not present

## 2014-02-22 DIAGNOSIS — I872 Venous insufficiency (chronic) (peripheral): Secondary | ICD-10-CM | POA: Diagnosis not present

## 2014-02-25 DIAGNOSIS — I509 Heart failure, unspecified: Secondary | ICD-10-CM | POA: Diagnosis not present

## 2014-02-25 DIAGNOSIS — I872 Venous insufficiency (chronic) (peripheral): Secondary | ICD-10-CM | POA: Diagnosis not present

## 2014-02-25 DIAGNOSIS — I10 Essential (primary) hypertension: Secondary | ICD-10-CM | POA: Diagnosis not present

## 2014-02-25 DIAGNOSIS — L97809 Non-pressure chronic ulcer of other part of unspecified lower leg with unspecified severity: Secondary | ICD-10-CM | POA: Diagnosis not present

## 2014-02-25 DIAGNOSIS — E119 Type 2 diabetes mellitus without complications: Secondary | ICD-10-CM | POA: Diagnosis not present

## 2014-02-26 ENCOUNTER — Other Ambulatory Visit: Payer: Medicare Other

## 2014-02-28 ENCOUNTER — Telehealth: Payer: Self-pay | Admitting: Internal Medicine

## 2014-02-28 ENCOUNTER — Other Ambulatory Visit: Payer: Self-pay | Admitting: Internal Medicine

## 2014-02-28 ENCOUNTER — Other Ambulatory Visit: Payer: Self-pay | Admitting: Cardiology

## 2014-02-28 NOTE — Telephone Encounter (Signed)
Patient's wife is calling on his behalf to request a new rx for HYDROcodone-acetaminophen (NORCO/VICODIN) 5-325 MG. Please advise.

## 2014-03-01 ENCOUNTER — Ambulatory Visit (INDEPENDENT_AMBULATORY_CARE_PROVIDER_SITE_OTHER): Payer: Medicare Other | Admitting: *Deleted

## 2014-03-01 ENCOUNTER — Other Ambulatory Visit (INDEPENDENT_AMBULATORY_CARE_PROVIDER_SITE_OTHER): Payer: Medicare Other

## 2014-03-01 ENCOUNTER — Telehealth: Payer: Self-pay | Admitting: *Deleted

## 2014-03-01 DIAGNOSIS — I6350 Cerebral infarction due to unspecified occlusion or stenosis of unspecified cerebral artery: Secondary | ICD-10-CM | POA: Diagnosis not present

## 2014-03-01 DIAGNOSIS — Z7901 Long term (current) use of anticoagulants: Secondary | ICD-10-CM | POA: Diagnosis not present

## 2014-03-01 DIAGNOSIS — I10 Essential (primary) hypertension: Secondary | ICD-10-CM | POA: Diagnosis not present

## 2014-03-01 DIAGNOSIS — E876 Hypokalemia: Secondary | ICD-10-CM

## 2014-03-01 DIAGNOSIS — L97809 Non-pressure chronic ulcer of other part of unspecified lower leg with unspecified severity: Secondary | ICD-10-CM | POA: Diagnosis not present

## 2014-03-01 DIAGNOSIS — E119 Type 2 diabetes mellitus without complications: Secondary | ICD-10-CM | POA: Diagnosis not present

## 2014-03-01 DIAGNOSIS — I5043 Acute on chronic combined systolic (congestive) and diastolic (congestive) heart failure: Secondary | ICD-10-CM

## 2014-03-01 DIAGNOSIS — I509 Heart failure, unspecified: Secondary | ICD-10-CM | POA: Diagnosis not present

## 2014-03-01 DIAGNOSIS — I872 Venous insufficiency (chronic) (peripheral): Secondary | ICD-10-CM | POA: Diagnosis not present

## 2014-03-01 LAB — BASIC METABOLIC PANEL
BUN: 16 mg/dL (ref 6–23)
CO2: 30 mEq/L (ref 19–32)
Calcium: 9.2 mg/dL (ref 8.4–10.5)
Chloride: 105 mEq/L (ref 96–112)
Creatinine, Ser: 2.1 mg/dL — ABNORMAL HIGH (ref 0.4–1.5)
GFR: 39.82 mL/min — AB (ref >60.00)
GLUCOSE: 117 mg/dL — AB (ref 70–99)
Potassium: 3.1 mEq/L — ABNORMAL LOW (ref 3.5–5.1)
Sodium: 144 mEq/L (ref 135–145)

## 2014-03-01 LAB — POCT INR: INR: 1.8

## 2014-03-01 MED ORDER — HYDROCODONE-ACETAMINOPHEN 5-325 MG PO TABS: 1.0000 | ORAL_TABLET | Freq: Two times a day (BID) | ORAL | Status: DC | PRN

## 2014-03-01 MED ORDER — POTASSIUM CHLORIDE CRYS ER 20 MEQ PO TBCR: EXTENDED_RELEASE_TABLET | ORAL | Status: DC

## 2014-03-01 NOTE — Telephone Encounter (Signed)
Rx printed/signed/upfront for p/u. Pt's wife informed.

## 2014-03-01 NOTE — Telephone Encounter (Signed)
OK to fill this prescription with additional refills x0 Thank you!  

## 2014-03-01 NOTE — Telephone Encounter (Signed)
pt asked for [place] to please d/w wife about his results because he will forget. wife notified about results and per DOD Dr. Ladona Ridgel to increase K+ to 60 meq AM and 80 meq PM. BMET 3/20. Dr. Antoine Poche appt 3/16.

## 2014-03-04 ENCOUNTER — Encounter: Payer: Self-pay | Admitting: Cardiology

## 2014-03-04 ENCOUNTER — Ambulatory Visit (INDEPENDENT_AMBULATORY_CARE_PROVIDER_SITE_OTHER): Payer: Medicare Other | Admitting: Cardiology

## 2014-03-04 VITALS — BP 156/94 | HR 66 | Ht 74.0 in | Wt 223.0 lb

## 2014-03-04 DIAGNOSIS — I509 Heart failure, unspecified: Secondary | ICD-10-CM

## 2014-03-04 DIAGNOSIS — I2510 Atherosclerotic heart disease of native coronary artery without angina pectoris: Secondary | ICD-10-CM

## 2014-03-04 DIAGNOSIS — I6350 Cerebral infarction due to unspecified occlusion or stenosis of unspecified cerebral artery: Secondary | ICD-10-CM

## 2014-03-04 DIAGNOSIS — L97809 Non-pressure chronic ulcer of other part of unspecified lower leg with unspecified severity: Secondary | ICD-10-CM | POA: Diagnosis not present

## 2014-03-04 DIAGNOSIS — I872 Venous insufficiency (chronic) (peripheral): Secondary | ICD-10-CM | POA: Diagnosis not present

## 2014-03-04 DIAGNOSIS — E119 Type 2 diabetes mellitus without complications: Secondary | ICD-10-CM | POA: Diagnosis not present

## 2014-03-04 DIAGNOSIS — E876 Hypokalemia: Secondary | ICD-10-CM | POA: Diagnosis not present

## 2014-03-04 DIAGNOSIS — I5022 Chronic systolic (congestive) heart failure: Secondary | ICD-10-CM

## 2014-03-04 DIAGNOSIS — I10 Essential (primary) hypertension: Secondary | ICD-10-CM | POA: Diagnosis not present

## 2014-03-04 DIAGNOSIS — I4891 Unspecified atrial fibrillation: Secondary | ICD-10-CM

## 2014-03-04 LAB — BASIC METABOLIC PANEL
BUN: 18 mg/dL (ref 6–23)
CHLORIDE: 106 meq/L (ref 96–112)
CO2: 30 mEq/L (ref 19–32)
Calcium: 9.3 mg/dL (ref 8.4–10.5)
Creatinine, Ser: 2 mg/dL — ABNORMAL HIGH (ref 0.4–1.5)
GFR: 41.41 mL/min — ABNORMAL LOW (ref >60.00)
GLUCOSE: 103 mg/dL — AB (ref 70–99)
Potassium: 3.7 mEq/L (ref 3.5–5.1)
Sodium: 145 mEq/L (ref 135–145)

## 2014-03-04 NOTE — Patient Instructions (Signed)
Please take an extra 20 mg of Torsemide today. Continue all other medications as listed.  Please have blood work today (BMP)  Follow up in 1 month with Tereso Newcomer, PA.

## 2014-03-04 NOTE — Progress Notes (Signed)
HPI The patient presents for evaluation of heart failure, acute on chronic renal insufficiency and atrial fibrillation. He is follow up with me and Mr. Alben Spittle.  Since I last saw him he has continued to followup her venous stasis ulcers and his left leg is now unwrapped.  He says he's feeling well. The patient denies any new symptoms such as chest discomfort, neck or arm discomfort. There has been no new shortness of breath, PND or orthopnea. There have been no reported palpitations, presyncope or syncope.  His weights have been slowly creeping up. I will   No Known Allergies  Current Outpatient Prescriptions  Medication Sig Dispense Refill  . amiodarone (PACERONE) 200 MG tablet Take 1 tablet (200 mg total) by mouth daily.  30 tablet  0  . amLODipine (NORVASC) 10 MG tablet Take 10 mg by mouth daily.      [name] atorvastatin (LIPITOR) 20 MG tablet Take 20 mg by mouth at bedtime.      . budesonide-formoterol (SYMBICORT) 160-4.5 MCG/ACT inhaler Inhale 2 puffs into the lungs daily as needed (shortness of breath).      . carvedilol (COREG) 6.25 MG tablet TAKE 1 TABLET (6.25 MG TOTAL) BY MOUTH 2 (TWO) TIMES DAILY WITH A MEAL.  60 tablet  5  . cholecalciferol (VITAMIN D) 1000 UNITS tablet Take 1,000 Units by mouth daily.      . collagenase (SANTYL) ointment Apply 1 application topically 2 (two) times a week. On Monday and Friday at home (for wounds on legs) - applied Wednesday at the wound center      . [employer] VITAMIN B12 1000 MCG tablet TAKE 1 TABLET BY MOUTH DAILY  100 tablet  3  . ferrous sulfate 325 (65 FE) MG tablet Take 1 tablet (325 mg total) by mouth daily.  30 tablet  6  . glipiZIDE (GLUCOTROL) 5 MG tablet TAKE 1/2 TABLET BY MOUTH TWICE A DAY BEFORE A MEAL  30 tablet  3  . hydrALAZINE (APRESOLINE) 50 MG tablet Take 50 mg by mouth 3 (three) times daily.      [name] HYDROcodone-acetaminophen (NORCO/VICODIN) 5-325 MG per tablet Take 1 tablet by mouth 2 (two) times daily as needed.  60 tablet  0  . isosorbide  mononitrate (IMDUR) 60 MG 24 hr tablet TAKE 1 TABLET (60 MG TOTAL) BY MOUTH DAILY.  30 tablet  5  . metolazone (ZAROXOLYN) 5 MG tablet 5 mg. As directed      . potassium chloride SA (K-DUR,KLOR-CON) 20 MEQ tablet Take 60 meq AM;  take 80 meq PM      . torsemide (DEMADEX) 20 MG tablet Take 2 tablets (40 mg total) by mouth 2 (two) times daily.  120 tablet  11  . triamcinolone cream (KENALOG) 0.5 % Apply 1 application topically 2 (two) times daily as needed (for skin breakdown).  30 g  0  . warfarin (COUMADIN) 5 MG tablet TAKE 1 TABLET (5 MG TOTAL) BY MOUTH AS DIRECTED.  40 tablet  2   No current facility-administered medications for this visit.    Past Medical History  Diagnosis Date  . Gout   . HTN (hypertension)   . Hyperlipidemia   . Osteoarthritis   . History of CVA (cerebrovascular accident)     Left pontine infarct July 2004; changed from Plavix to Coumadin in 2004 per MD at [facility]  . GERD (gastroesophageal reflux disease)   . DJD (degenerative joint disease)   . BPH (benign prostatic hypertrophy)   . DM (  diabetes mellitus), type 2   . Peripheral vascular disease   . Bilateral leg ulcer     ACHILLES AREA--  NONHEALING  . CKD (chronic kidney disease) stage 3, GFR 30-59 ml/min   . CAD (coronary artery disease)     a. LHC 4/12: Mid LAD 25%, mid diagonal 30%, AV circumflex 40%, proximal OM 25%, distal RCA 40%  . Chronic combined systolic and diastolic heart failure     a. Echo 05/2013: EF 25-30%, diffuse HK, restrictive physiology, trivial AI, mild MR, moderate LAE, reduced RV systolic function, PASP 42  . LBBB (left bundle branch block)   . Atrial fibrillation     a. amiodarone Rx started 05/2013;  b. chronic coumadin  . NICM (nonischemic cardiomyopathy)     a. EF 25-30%.    Past Surgical History  Procedure Laterality Date  . Cardiac catheterization  04-16-2011   [facility]    NON-OBSTRUCTIVE CAD. MILDLY ELEVATED PULMONARY PRESSURES/ ELEVATED  END-DIASTOLIC PRESSURE  .  Transthoracic echocardiogram  12-31-2010    MODERATE CONCENTRIC LVH/ SYSTOLIC FUNCTION SEVERELY REDUCED/ EF 25-30%/  SEVERE HYPOKINESIS OF ANTEROSEPTAL MYOCARDIUM  AND ENTIREAPICAL MYOCARDIUM /  MODERATE HYPOKINESIS OF LATERAL, INFEROLATERAL, INFERIOR,AND INFEROSEPTAL MYOCARIUM/  GRADE 3 DIASTOLIC DYSFUNCTION/ MILD MR  . Aortogram w/ bilateral lower extremitiy runoff  05-18-2013  DR FIELDS    LEFT LEG OCCLUDED PERONEAL AND ANTERIOR TIBIAL ARTERIES/ HIGH GRADE STENOIS 80% MIDDLE AND DISTAL THIRD OF POSTERIOR TIBIAL ARTERY/ RIGHT PERONEAL AND ANTERIOR TIBIAL ARTERY OCCLUDED/ 40% STENOSIS DISTALLY     ROS:  As stated in the HPI and negative for all other systems.  PHYSICAL EXAM BP 156/94  Pulse 66  Ht 6\' 2"  (1.88 m)  Wt 223 lb (101.152 kg)  BMI 28.62 kg/m2 GENERAL:  Well appearing NECK:  6o JVD, waveform within normal limits, carotid upstroke brisk and symmetric, no bruits, no thyromegaly LUNGS:  Clear to auscultation bilaterally BACK:  No CVA tenderness CHEST:  Unremarkable HEART:  PMI not displaced or sustained,S1 and S2 within normal limits, no S3, no S4, no clicks, no rubs, no murmurs ABD:  Flat, positive bowel sounds normal in frequency in pitch, no bruits, no rebound, no guarding, no midline pulsatile mass, no hepatomegaly, no splenomegaly EXT:  2 plus pulses throughout, left greater than right mild to moderate edema, no cyanosis no clubbing, the legs are wrapped.   EKG:  Sinus rhythm, rate 66, left bundle branch block, left axis deviation, no change from previous. 03/04/2014  ASSESSMENT AND PLAN  CONGESTIVE HEART FAILURE -  His weights have been slowly creeping up without any dramatic day-to-day change. I will have him take an extra 20 mg of Demadex today. He will be getting a basic metabolic profile. He'll otherwise continue the meds as listed.  HYPERTENSION -  The blood pressure is slightly elevated today.  However, this is unusual. He will continue with the meds as listed.  PVD  - His ulcers are healing.  No change in therapy.   ATRIAL FIBRILLATION:   The patient seems to be maintaining sinus rhythm. I will reduce the amiodarone to 200 mg daily.  He is up to date with lab work.    Lab Results  Component Value Date   TSH 0.90 08/24/2013   ALT 12 11/26/2013   AST 14 11/26/2013   ALKPHOS 88 11/26/2013   BILITOT 0.6 11/26/2013   PROT 6.3 11/26/2013   ALBUMIN 3.3* 11/26/2013

## 2014-03-06 DIAGNOSIS — L97309 Non-pressure chronic ulcer of unspecified ankle with unspecified severity: Secondary | ICD-10-CM | POA: Diagnosis not present

## 2014-03-06 DIAGNOSIS — E1169 Type 2 diabetes mellitus with other specified complication: Secondary | ICD-10-CM | POA: Diagnosis not present

## 2014-03-07 DIAGNOSIS — I872 Venous insufficiency (chronic) (peripheral): Secondary | ICD-10-CM | POA: Diagnosis not present

## 2014-03-07 DIAGNOSIS — L97809 Non-pressure chronic ulcer of other part of unspecified lower leg with unspecified severity: Secondary | ICD-10-CM | POA: Diagnosis not present

## 2014-03-07 DIAGNOSIS — I509 Heart failure, unspecified: Secondary | ICD-10-CM | POA: Diagnosis not present

## 2014-03-07 DIAGNOSIS — E119 Type 2 diabetes mellitus without complications: Secondary | ICD-10-CM | POA: Diagnosis not present

## 2014-03-07 DIAGNOSIS — I10 Essential (primary) hypertension: Secondary | ICD-10-CM | POA: Diagnosis not present

## 2014-03-07 LAB — GLUCOSE, CAPILLARY: GLUCOSE-CAPILLARY: 115 mg/dL — AB (ref 70–99)

## 2014-03-08 ENCOUNTER — Other Ambulatory Visit (INDEPENDENT_AMBULATORY_CARE_PROVIDER_SITE_OTHER): Payer: Medicare Other

## 2014-03-08 DIAGNOSIS — I509 Heart failure, unspecified: Secondary | ICD-10-CM | POA: Diagnosis not present

## 2014-03-08 DIAGNOSIS — I5043 Acute on chronic combined systolic (congestive) and diastolic (congestive) heart failure: Secondary | ICD-10-CM

## 2014-03-08 DIAGNOSIS — E119 Type 2 diabetes mellitus without complications: Secondary | ICD-10-CM | POA: Diagnosis not present

## 2014-03-08 DIAGNOSIS — L97809 Non-pressure chronic ulcer of other part of unspecified lower leg with unspecified severity: Secondary | ICD-10-CM | POA: Diagnosis not present

## 2014-03-08 DIAGNOSIS — I10 Essential (primary) hypertension: Secondary | ICD-10-CM | POA: Diagnosis not present

## 2014-03-08 DIAGNOSIS — E876 Hypokalemia: Secondary | ICD-10-CM

## 2014-03-08 DIAGNOSIS — I872 Venous insufficiency (chronic) (peripheral): Secondary | ICD-10-CM | POA: Diagnosis not present

## 2014-03-08 LAB — BASIC METABOLIC PANEL
BUN: 19 mg/dL (ref 6–23)
CHLORIDE: 105 meq/L (ref 96–112)
CO2: 29 meq/L (ref 19–32)
CREATININE: 2.1 mg/dL — AB (ref 0.4–1.5)
Calcium: 9.4 mg/dL (ref 8.4–10.5)
GFR: 40.26 mL/min — ABNORMAL LOW (ref >60.00)
Glucose, Bld: 99 mg/dL (ref 70–99)
Potassium: 3.4 mEq/L — ABNORMAL LOW (ref 3.5–5.1)
Sodium: 143 mEq/L (ref 135–145)

## 2014-03-11 ENCOUNTER — Telehealth: Payer: Self-pay | Admitting: *Deleted

## 2014-03-11 DIAGNOSIS — L97809 Non-pressure chronic ulcer of other part of unspecified lower leg with unspecified severity: Secondary | ICD-10-CM | POA: Diagnosis not present

## 2014-03-11 DIAGNOSIS — I10 Essential (primary) hypertension: Secondary | ICD-10-CM | POA: Diagnosis not present

## 2014-03-11 DIAGNOSIS — I872 Venous insufficiency (chronic) (peripheral): Secondary | ICD-10-CM | POA: Diagnosis not present

## 2014-03-11 DIAGNOSIS — E119 Type 2 diabetes mellitus without complications: Secondary | ICD-10-CM | POA: Diagnosis not present

## 2014-03-11 DIAGNOSIS — I509 Heart failure, unspecified: Secondary | ICD-10-CM

## 2014-03-11 NOTE — Telephone Encounter (Signed)
pt's wife notified about lab results and to have pt take extra 20 meq K+ today only, then resume regular K+ dose, bmet 03/18/14. Wife verbalized understanding to Plan of Care

## 2014-03-12 ENCOUNTER — Other Ambulatory Visit: Payer: Self-pay | Admitting: Internal Medicine

## 2014-03-13 LAB — GLUCOSE, CAPILLARY: GLUCOSE-CAPILLARY: 114 mg/dL — AB (ref 70–99)

## 2014-03-14 ENCOUNTER — Ambulatory Visit (INDEPENDENT_AMBULATORY_CARE_PROVIDER_SITE_OTHER): Payer: Medicare Other | Admitting: *Deleted

## 2014-03-14 DIAGNOSIS — Z5181 Encounter for therapeutic drug level monitoring: Secondary | ICD-10-CM | POA: Insufficient documentation

## 2014-03-14 DIAGNOSIS — I6350 Cerebral infarction due to unspecified occlusion or stenosis of unspecified cerebral artery: Secondary | ICD-10-CM

## 2014-03-14 DIAGNOSIS — Z7901 Long term (current) use of anticoagulants: Secondary | ICD-10-CM | POA: Diagnosis not present

## 2014-03-14 LAB — POCT INR: INR: 3

## 2014-03-15 DIAGNOSIS — E119 Type 2 diabetes mellitus without complications: Secondary | ICD-10-CM | POA: Diagnosis not present

## 2014-03-15 DIAGNOSIS — I872 Venous insufficiency (chronic) (peripheral): Secondary | ICD-10-CM | POA: Diagnosis not present

## 2014-03-15 DIAGNOSIS — L97809 Non-pressure chronic ulcer of other part of unspecified lower leg with unspecified severity: Secondary | ICD-10-CM | POA: Diagnosis not present

## 2014-03-15 DIAGNOSIS — I10 Essential (primary) hypertension: Secondary | ICD-10-CM | POA: Diagnosis not present

## 2014-03-15 DIAGNOSIS — I509 Heart failure, unspecified: Secondary | ICD-10-CM | POA: Diagnosis not present

## 2014-03-18 ENCOUNTER — Other Ambulatory Visit: Payer: Self-pay

## 2014-03-18 DIAGNOSIS — I10 Essential (primary) hypertension: Secondary | ICD-10-CM | POA: Diagnosis not present

## 2014-03-18 DIAGNOSIS — E119 Type 2 diabetes mellitus without complications: Secondary | ICD-10-CM | POA: Diagnosis not present

## 2014-03-18 DIAGNOSIS — I5043 Acute on chronic combined systolic (congestive) and diastolic (congestive) heart failure: Secondary | ICD-10-CM

## 2014-03-18 DIAGNOSIS — L97809 Non-pressure chronic ulcer of other part of unspecified lower leg with unspecified severity: Secondary | ICD-10-CM | POA: Diagnosis not present

## 2014-03-18 DIAGNOSIS — I509 Heart failure, unspecified: Secondary | ICD-10-CM | POA: Diagnosis not present

## 2014-03-18 DIAGNOSIS — I872 Venous insufficiency (chronic) (peripheral): Secondary | ICD-10-CM | POA: Diagnosis not present

## 2014-03-18 MED ORDER — POTASSIUM CHLORIDE CRYS ER 20 MEQ PO TBCR: EXTENDED_RELEASE_TABLET | ORAL | Status: DC

## 2014-03-20 ENCOUNTER — Encounter (HOSPITAL_BASED_OUTPATIENT_CLINIC_OR_DEPARTMENT_OTHER): Payer: Medicare Other | Attending: General Surgery

## 2014-03-20 DIAGNOSIS — L97809 Non-pressure chronic ulcer of other part of unspecified lower leg with unspecified severity: Secondary | ICD-10-CM | POA: Insufficient documentation

## 2014-03-20 DIAGNOSIS — E1169 Type 2 diabetes mellitus with other specified complication: Secondary | ICD-10-CM | POA: Insufficient documentation

## 2014-03-20 LAB — GLUCOSE, CAPILLARY: GLUCOSE-CAPILLARY: 118 mg/dL — AB (ref 70–99)

## 2014-03-21 ENCOUNTER — Telehealth: Payer: Self-pay | Admitting: *Deleted

## 2014-03-21 DIAGNOSIS — I5043 Acute on chronic combined systolic (congestive) and diastolic (congestive) heart failure: Secondary | ICD-10-CM

## 2014-03-21 DIAGNOSIS — I70219 Atherosclerosis of native arteries of extremities with intermittent claudication, unspecified extremity: Secondary | ICD-10-CM

## 2014-03-21 NOTE — Telephone Encounter (Signed)
"  Exceeding number of tablets allowed" form faxed to [facility] for patients POTASSIUM.

## 2014-03-22 DIAGNOSIS — I509 Heart failure, unspecified: Secondary | ICD-10-CM | POA: Diagnosis not present

## 2014-03-22 DIAGNOSIS — E119 Type 2 diabetes mellitus without complications: Secondary | ICD-10-CM | POA: Diagnosis not present

## 2014-03-22 DIAGNOSIS — L97809 Non-pressure chronic ulcer of other part of unspecified lower leg with unspecified severity: Secondary | ICD-10-CM | POA: Diagnosis not present

## 2014-03-22 DIAGNOSIS — I872 Venous insufficiency (chronic) (peripheral): Secondary | ICD-10-CM | POA: Diagnosis not present

## 2014-03-22 DIAGNOSIS — I10 Essential (primary) hypertension: Secondary | ICD-10-CM | POA: Diagnosis not present

## 2014-03-25 DIAGNOSIS — L97809 Non-pressure chronic ulcer of other part of unspecified lower leg with unspecified severity: Secondary | ICD-10-CM | POA: Diagnosis not present

## 2014-03-25 DIAGNOSIS — I509 Heart failure, unspecified: Secondary | ICD-10-CM | POA: Diagnosis not present

## 2014-03-25 DIAGNOSIS — E119 Type 2 diabetes mellitus without complications: Secondary | ICD-10-CM | POA: Diagnosis not present

## 2014-03-25 DIAGNOSIS — I10 Essential (primary) hypertension: Secondary | ICD-10-CM | POA: Diagnosis not present

## 2014-03-25 DIAGNOSIS — I872 Venous insufficiency (chronic) (peripheral): Secondary | ICD-10-CM | POA: Diagnosis not present

## 2014-03-27 LAB — GLUCOSE, CAPILLARY: Glucose-Capillary: 125 mg/dL — ABNORMAL HIGH (ref 70–99)

## 2014-03-29 DIAGNOSIS — L97809 Non-pressure chronic ulcer of other part of unspecified lower leg with unspecified severity: Secondary | ICD-10-CM | POA: Diagnosis not present

## 2014-03-29 DIAGNOSIS — I10 Essential (primary) hypertension: Secondary | ICD-10-CM | POA: Diagnosis not present

## 2014-03-29 DIAGNOSIS — I509 Heart failure, unspecified: Secondary | ICD-10-CM | POA: Diagnosis not present

## 2014-03-29 DIAGNOSIS — E119 Type 2 diabetes mellitus without complications: Secondary | ICD-10-CM | POA: Diagnosis not present

## 2014-03-29 DIAGNOSIS — I872 Venous insufficiency (chronic) (peripheral): Secondary | ICD-10-CM | POA: Diagnosis not present

## 2014-04-01 ENCOUNTER — Encounter: Payer: Self-pay | Admitting: Physician Assistant

## 2014-04-01 ENCOUNTER — Ambulatory Visit (INDEPENDENT_AMBULATORY_CARE_PROVIDER_SITE_OTHER): Payer: Medicare Other | Admitting: Pharmacist

## 2014-04-01 ENCOUNTER — Ambulatory Visit (INDEPENDENT_AMBULATORY_CARE_PROVIDER_SITE_OTHER): Payer: Medicare Other | Admitting: Physician Assistant

## 2014-04-01 VITALS — BP 142/90 | HR 58 | Ht 74.0 in | Wt 224.0 lb

## 2014-04-01 DIAGNOSIS — I5042 Chronic combined systolic (congestive) and diastolic (congestive) heart failure: Secondary | ICD-10-CM | POA: Diagnosis not present

## 2014-04-01 DIAGNOSIS — Z79899 Other long term (current) drug therapy: Secondary | ICD-10-CM | POA: Diagnosis not present

## 2014-04-01 DIAGNOSIS — E785 Hyperlipidemia, unspecified: Secondary | ICD-10-CM

## 2014-04-01 DIAGNOSIS — I6350 Cerebral infarction due to unspecified occlusion or stenosis of unspecified cerebral artery: Secondary | ICD-10-CM

## 2014-04-01 DIAGNOSIS — I2510 Atherosclerotic heart disease of native coronary artery without angina pectoris: Secondary | ICD-10-CM

## 2014-04-01 DIAGNOSIS — Z5181 Encounter for therapeutic drug level monitoring: Secondary | ICD-10-CM

## 2014-04-01 DIAGNOSIS — N183 Chronic kidney disease, stage 3 unspecified: Secondary | ICD-10-CM | POA: Diagnosis not present

## 2014-04-01 DIAGNOSIS — I10 Essential (primary) hypertension: Secondary | ICD-10-CM

## 2014-04-01 DIAGNOSIS — I4891 Unspecified atrial fibrillation: Secondary | ICD-10-CM | POA: Diagnosis not present

## 2014-04-01 DIAGNOSIS — Z7901 Long term (current) use of anticoagulants: Secondary | ICD-10-CM

## 2014-04-01 DIAGNOSIS — I509 Heart failure, unspecified: Secondary | ICD-10-CM | POA: Diagnosis not present

## 2014-04-01 DIAGNOSIS — R0600 Dyspnea, unspecified: Secondary | ICD-10-CM

## 2014-04-01 DIAGNOSIS — L97809 Non-pressure chronic ulcer of other part of unspecified lower leg with unspecified severity: Secondary | ICD-10-CM | POA: Diagnosis not present

## 2014-04-01 DIAGNOSIS — I872 Venous insufficiency (chronic) (peripheral): Secondary | ICD-10-CM | POA: Diagnosis not present

## 2014-04-01 DIAGNOSIS — R0989 Other specified symptoms and signs involving the circulatory and respiratory systems: Secondary | ICD-10-CM

## 2014-04-01 DIAGNOSIS — R0609 Other forms of dyspnea: Secondary | ICD-10-CM

## 2014-04-01 DIAGNOSIS — I428 Other cardiomyopathies: Secondary | ICD-10-CM

## 2014-04-01 DIAGNOSIS — E119 Type 2 diabetes mellitus without complications: Secondary | ICD-10-CM | POA: Diagnosis not present

## 2014-04-01 LAB — BASIC METABOLIC PANEL
BUN: 23 mg/dL (ref 6–23)
CHLORIDE: 113 meq/L — AB (ref 96–112)
CO2: 27 meq/L (ref 19–32)
Calcium: 9.3 mg/dL (ref 8.4–10.5)
Creatinine, Ser: 2.3 mg/dL — ABNORMAL HIGH (ref 0.4–1.5)
GFR: 36.76 mL/min — ABNORMAL LOW (ref >60.00)
Glucose, Bld: 108 mg/dL — ABNORMAL HIGH (ref 70–99)
POTASSIUM: 4.4 meq/L (ref 3.5–5.1)
Sodium: 153 mEq/L — ABNORMAL HIGH (ref 135–145)

## 2014-04-01 LAB — POCT INR: INR: 2.5

## 2014-04-01 LAB — BRAIN NATRIURETIC PEPTIDE: Pro B Natriuretic peptide (BNP): 138 pg/mL — ABNORMAL HIGH (ref 0.0–100.0)

## 2014-04-01 LAB — TSH: TSH: 3.31 u[IU]/mL (ref 0.35–5.50)

## 2014-04-01 NOTE — Patient Instructions (Signed)
Your physician recommends that you return for lab work in: TODAY BMET TSH BNP   Your physician recommends that you schedule a follow-up appointment in: 2 MONTHS  WITH SCOTT WEAVER PA-C  Your physician wants you to follow-up in: 4 MONTHS WITH DR Antoine Poche You will receive a reminder letter in the mail two months in advance. If you don't receive a letter, please call our office to schedule the follow-up appointment.

## 2014-04-01 NOTE — Progress Notes (Signed)
[address], [address] [place], [place]  [address] Phone: [phone] Fax:  [phone]  Date:  04/01/2014   ID:  Frank, Mclean [birth year], MRN [id number]  PCP:  [name], MD  Cardiologist:  Dr. Rollene Rotunda     History of Present Illness: Frank Mclean is a 73 y.o. male with a hx of chronic combined systolic and diastolic CHF, NICM (EF 25-30%), nonobstructive CAD (cath 2012), AFib, CAD, HTN, HL, PAD.   Admitted 05/2013 for AFib with RVR complicated by type II non-STEMI and a/c combined systolic and diastolic CHF. He was placed on amiodarone at that time with restoration of NSR.   Admitted 08/2013 for a/c renal failure.   He was felt to be dehydrated and he was gently hydrated with IVFs ("warm and dry") and supported with low dose dopamine.       Admitted 11/2013 with acute respiratory failure secondary to community acquired pneumonia complicated by decompensated heart failure.   Last seen by Dr. Antoine Poche 02/2014.  His weights were slowly increasing and he was given an extra dose of torsemide.  Amiodarone was reduced.  He returns for follow up.  He is here with his brother-in-law today. He is overall feeling well. The wrap on his left lower extremity has come off as his wound is healed. His right lower extremity remains wrapped. He denies chest discomfort. He denies significant dyspnea. He is overall NYHA class IIb. He denies orthopnea or PND. He denies LE edema. He denies syncope. He denies cough.   Recent Labs: 04/17/2013: LDL (calc) 70  08/24/2013: TSH 0.90  11/26/2013: ALT 12; Hemoglobin 10.0*; Pro B Natriuretic peptide (BNP) 5744.0*  03/08/2014: Creatinine 2.1*; Potassium 3.4*    Wt Readings from Last 3 Encounters:  04/01/14 224 lb (101.606 kg)  03/04/14 223 lb (101.152 kg)  02/18/14 218 lb (98.884 kg)     Past Medical History  Diagnosis Date  . Gout   . HTN (hypertension)   . Hyperlipidemia   . Osteoarthritis   . History of CVA (cerebrovascular  accident)     Left pontine infarct July 2004; changed from Plavix to Coumadin in 2004 per MD at [facility]  . GERD (gastroesophageal reflux disease)   . DJD (degenerative joint disease)   . BPH (benign prostatic hypertrophy)   . DM (diabetes mellitus), type 2   . Peripheral vascular disease   . Bilateral leg ulcer     ACHILLES AREA--  NONHEALING  . CKD (chronic kidney disease) stage 3, GFR 30-59 ml/min   . CAD (coronary artery disease)     a. LHC 4/12: Mid LAD 25%, mid diagonal 30%, AV circumflex 40%, proximal OM 25%, distal RCA 40%  . Chronic combined systolic and diastolic heart failure     a. Echo 05/2013: EF 25-30%, diffuse HK, restrictive physiology, trivial AI, mild MR, moderate LAE, reduced RV systolic function, PASP 42  . LBBB (left bundle branch block)   . Atrial fibrillation     a. amiodarone Rx started 05/2013;  b. chronic coumadin  . NICM (nonischemic cardiomyopathy)     a. EF 25-30%.    Current Outpatient Prescriptions  Medication Sig Dispense Refill  . amiodarone (PACERONE) 200 MG tablet Take 1 tablet (200 mg total) by mouth daily.  30 tablet  0  . amLODipine (NORVASC) 10 MG tablet Take 10 mg by mouth daily.      [name] atorvastatin (LIPITOR) 20 MG tablet Take 20 mg by mouth at bedtime.      [name]  budesonide-formoterol (SYMBICORT) 160-4.5 MCG/ACT inhaler Inhale 2 puffs into the lungs daily as needed (shortness of breath).      . carvedilol (COREG) 6.25 MG tablet TAKE 1 TABLET (6.25 MG TOTAL) BY MOUTH 2 (TWO) TIMES DAILY WITH A MEAL.  60 tablet  5  . cholecalciferol (VITAMIN D) 1000 UNITS tablet Take 1,000 Units by mouth daily.      . collagenase (SANTYL) ointment Apply 1 application topically 2 (two) times a week. On Monday and Friday at home (for wounds on legs) - applied Wednesday at the wound center      . CVS VITAMIN B12 1000 MCG tablet TAKE 1 TABLET BY MOUTH DAILY  100 tablet  3  . ferrous sulfate 325 (65 FE) MG tablet TAKE 1 TABLET BY MOUTH EVERY DAY  30 tablet  6  . glipiZIDE  (GLUCOTROL) 5 MG tablet TAKE 1/2 TABLET BY MOUTH TWICE A DAY BEFORE A MEAL  30 tablet  3  . hydrALAZINE (APRESOLINE) 50 MG tablet Take 50 mg by mouth 3 (three) times daily.      [name] HYDROcodone-acetaminophen (NORCO/VICODIN) 5-325 MG per tablet Take 1 tablet by mouth 2 (two) times daily as needed.  60 tablet  0  . isosorbide mononitrate (IMDUR) 60 MG 24 hr tablet TAKE 1 TABLET (60 MG TOTAL) BY MOUTH DAILY.  30 tablet  5  . metolazone (ZAROXOLYN) 5 MG tablet 5 mg. As directed      . torsemide (DEMADEX) 20 MG tablet Take 2 tablets (40 mg total) by mouth 2 (two) times daily.  120 tablet  11  . triamcinolone cream (KENALOG) 0.5 % Apply 1 application topically 2 (two) times daily as needed (for skin breakdown).  30 g  0  . warfarin (COUMADIN) 5 MG tablet TAKE 1 TABLET (5 MG TOTAL) BY MOUTH AS DIRECTED.  40 tablet  2   No current facility-administered medications for this visit.    Allergies:   No Known Allergies  Social History:  The patient  reports that he quit smoking about 39 years ago. He has never used smokeless tobacco. He reports that he does not drink alcohol or use illicit drugs.    Family History:  [name] family history includes Diabetes in his mother; Gout in his other; Heart disease in his father and mother; Hypertension in his father and mother; Stroke in his other.   ROS:  Please see the history of present illness.    All other systems reviewed and negative.   PHYSICAL EXAM: VS:  BP 142/90  Pulse 58  Ht 6\' 2"  (1.88 m)  Wt 224 lb (101.606 kg)  BMI 28.75 kg/m2 Well nourished, well developed, in no acute distress HEENT: normal Neck: no JVD   Cardiac:  normal S1, S2; RRR; no murmur; no rub Lungs:  Decreased breath sounds bilaterally, no wheezing, rhonchi or rales Abd: soft, nontender, no hepatomegaly Ext: no LE edema; RLE wrap in place Skin: warm and dry Neuro:  CNs 2-12 intact, face is symmetrical  EKG:   Sinus bradycardia, HR 58, LBBB     ASSESSMENT AND  PLAN:  1. Chronic Combined Systolic and Diastolic CHF:   Volume appears stable.  His weight is slowly increasing but his appetite has improved.  I believe his baseline weight is likely now 221-222 (at home).  Check BMET and BNP today.  if BNP is higher, adjust Torsemide.  Continue current Rx. Given his LE wounds, he would not currently be a candidate for device implantation. This  can certainly be considered in the future (CRT +/- ICD).   2. Non-Ischemic CM:  As noted, consider candidacy for ICD in future once wounds healed.  Will need f/u echo at that time.  Continue current dose of beta blocker.  He is not on ACEI due to CKD.  Continue hydralazine and nitrates.   3. CAD:  No angina. He is not on aspirin as he is on Coumadin. Continue statin. 4. Atrial Fibrillation:  Maintaining NSR.  He remains on coumadin and Amiodarone.  Recent LFTs ok.  Check TSH. 5. Chronic Kidney Disease:   Repeat BMET today.    6. Hypertension:  BP borderline today.  However, home BPs have been optimal [facility] 7. Hyperlipidemia:  Continue statin. 8. Lower Extremity Ulcers:  Continue f/u with the wound clinic. 9. Disposition: Follow up with me in 2 mos and Dr. Rollene RotundaJames Hochrein in 4 mos.    Signed, Tereso NewcomerScott Bryonna Sundby, PA-C  04/01/2014 11:22 AM

## 2014-04-02 ENCOUNTER — Telehealth: Payer: Self-pay | Admitting: Cardiology

## 2014-04-02 ENCOUNTER — Other Ambulatory Visit: Payer: Self-pay | Admitting: *Deleted

## 2014-04-02 DIAGNOSIS — I10 Essential (primary) hypertension: Secondary | ICD-10-CM

## 2014-04-02 NOTE — Telephone Encounter (Signed)
New message    Patient calling for test result.

## 2014-04-02 NOTE — Telephone Encounter (Signed)
Left message to call back  

## 2014-04-03 DIAGNOSIS — E1169 Type 2 diabetes mellitus with other specified complication: Secondary | ICD-10-CM | POA: Diagnosis not present

## 2014-04-03 DIAGNOSIS — L97809 Non-pressure chronic ulcer of other part of unspecified lower leg with unspecified severity: Secondary | ICD-10-CM | POA: Diagnosis not present

## 2014-04-03 LAB — GLUCOSE, CAPILLARY: Glucose-Capillary: 81 mg/dL (ref 70–99)

## 2014-04-04 NOTE — Telephone Encounter (Signed)
Wife aware of results.

## 2014-04-05 DIAGNOSIS — I10 Essential (primary) hypertension: Secondary | ICD-10-CM | POA: Diagnosis not present

## 2014-04-05 DIAGNOSIS — L97809 Non-pressure chronic ulcer of other part of unspecified lower leg with unspecified severity: Secondary | ICD-10-CM | POA: Diagnosis not present

## 2014-04-05 DIAGNOSIS — E119 Type 2 diabetes mellitus without complications: Secondary | ICD-10-CM | POA: Diagnosis not present

## 2014-04-05 DIAGNOSIS — I509 Heart failure, unspecified: Secondary | ICD-10-CM | POA: Diagnosis not present

## 2014-04-05 DIAGNOSIS — I872 Venous insufficiency (chronic) (peripheral): Secondary | ICD-10-CM | POA: Diagnosis not present

## 2014-04-08 DIAGNOSIS — E119 Type 2 diabetes mellitus without complications: Secondary | ICD-10-CM | POA: Diagnosis not present

## 2014-04-08 DIAGNOSIS — L97809 Non-pressure chronic ulcer of other part of unspecified lower leg with unspecified severity: Secondary | ICD-10-CM | POA: Diagnosis not present

## 2014-04-08 DIAGNOSIS — I10 Essential (primary) hypertension: Secondary | ICD-10-CM | POA: Diagnosis not present

## 2014-04-08 DIAGNOSIS — I872 Venous insufficiency (chronic) (peripheral): Secondary | ICD-10-CM | POA: Diagnosis not present

## 2014-04-08 DIAGNOSIS — I509 Heart failure, unspecified: Secondary | ICD-10-CM | POA: Diagnosis not present

## 2014-04-10 ENCOUNTER — Other Ambulatory Visit: Payer: Self-pay | Admitting: Internal Medicine

## 2014-04-10 DIAGNOSIS — E1169 Type 2 diabetes mellitus with other specified complication: Secondary | ICD-10-CM | POA: Diagnosis not present

## 2014-04-10 DIAGNOSIS — L97809 Non-pressure chronic ulcer of other part of unspecified lower leg with unspecified severity: Secondary | ICD-10-CM | POA: Diagnosis not present

## 2014-04-10 LAB — GLUCOSE, CAPILLARY: GLUCOSE-CAPILLARY: 88 mg/dL (ref 70–99)

## 2014-04-11 ENCOUNTER — Other Ambulatory Visit: Payer: Medicare Other

## 2014-04-12 DIAGNOSIS — L97809 Non-pressure chronic ulcer of other part of unspecified lower leg with unspecified severity: Secondary | ICD-10-CM | POA: Diagnosis not present

## 2014-04-12 DIAGNOSIS — I872 Venous insufficiency (chronic) (peripheral): Secondary | ICD-10-CM | POA: Diagnosis not present

## 2014-04-12 DIAGNOSIS — I10 Essential (primary) hypertension: Secondary | ICD-10-CM | POA: Diagnosis not present

## 2014-04-12 DIAGNOSIS — I509 Heart failure, unspecified: Secondary | ICD-10-CM | POA: Diagnosis not present

## 2014-04-12 DIAGNOSIS — E119 Type 2 diabetes mellitus without complications: Secondary | ICD-10-CM | POA: Diagnosis not present

## 2014-04-15 ENCOUNTER — Other Ambulatory Visit (INDEPENDENT_AMBULATORY_CARE_PROVIDER_SITE_OTHER): Payer: Medicare Other

## 2014-04-15 DIAGNOSIS — E119 Type 2 diabetes mellitus without complications: Secondary | ICD-10-CM | POA: Diagnosis not present

## 2014-04-15 DIAGNOSIS — I509 Heart failure, unspecified: Secondary | ICD-10-CM

## 2014-04-15 DIAGNOSIS — L97809 Non-pressure chronic ulcer of other part of unspecified lower leg with unspecified severity: Secondary | ICD-10-CM | POA: Diagnosis not present

## 2014-04-15 DIAGNOSIS — I872 Venous insufficiency (chronic) (peripheral): Secondary | ICD-10-CM | POA: Diagnosis not present

## 2014-04-15 DIAGNOSIS — I10 Essential (primary) hypertension: Secondary | ICD-10-CM | POA: Diagnosis not present

## 2014-04-15 LAB — BASIC METABOLIC PANEL
BUN: 21 mg/dL (ref 6–23)
CHLORIDE: 106 meq/L (ref 96–112)
CO2: 30 mEq/L (ref 19–32)
Calcium: 9.2 mg/dL (ref 8.4–10.5)
Creatinine, Ser: 2.4 mg/dL — ABNORMAL HIGH (ref 0.4–1.5)
GFR: 34.62 mL/min — ABNORMAL LOW (ref >60.00)
Glucose, Bld: 83 mg/dL (ref 70–99)
Potassium: 4 mEq/L (ref 3.5–5.1)
SODIUM: 144 meq/L (ref 135–145)

## 2014-04-16 ENCOUNTER — Telehealth: Payer: Self-pay | Admitting: *Deleted

## 2014-04-16 NOTE — Telephone Encounter (Signed)
pt's wife notified about lab results and no changes to be made

## 2014-04-19 DIAGNOSIS — I10 Essential (primary) hypertension: Secondary | ICD-10-CM | POA: Diagnosis not present

## 2014-04-19 DIAGNOSIS — L97809 Non-pressure chronic ulcer of other part of unspecified lower leg with unspecified severity: Secondary | ICD-10-CM | POA: Diagnosis not present

## 2014-04-19 DIAGNOSIS — E119 Type 2 diabetes mellitus without complications: Secondary | ICD-10-CM | POA: Diagnosis not present

## 2014-04-19 DIAGNOSIS — I872 Venous insufficiency (chronic) (peripheral): Secondary | ICD-10-CM | POA: Diagnosis not present

## 2014-04-19 DIAGNOSIS — I509 Heart failure, unspecified: Secondary | ICD-10-CM | POA: Diagnosis not present

## 2014-04-22 DIAGNOSIS — L97809 Non-pressure chronic ulcer of other part of unspecified lower leg with unspecified severity: Secondary | ICD-10-CM | POA: Diagnosis not present

## 2014-04-22 DIAGNOSIS — I509 Heart failure, unspecified: Secondary | ICD-10-CM | POA: Diagnosis not present

## 2014-04-22 DIAGNOSIS — I10 Essential (primary) hypertension: Secondary | ICD-10-CM | POA: Diagnosis not present

## 2014-04-22 DIAGNOSIS — E119 Type 2 diabetes mellitus without complications: Secondary | ICD-10-CM | POA: Diagnosis not present

## 2014-04-22 DIAGNOSIS — I872 Venous insufficiency (chronic) (peripheral): Secondary | ICD-10-CM | POA: Diagnosis not present

## 2014-04-24 ENCOUNTER — Encounter (HOSPITAL_BASED_OUTPATIENT_CLINIC_OR_DEPARTMENT_OTHER): Payer: Medicare Other | Attending: General Surgery

## 2014-04-24 DIAGNOSIS — E1169 Type 2 diabetes mellitus with other specified complication: Secondary | ICD-10-CM | POA: Diagnosis not present

## 2014-04-24 DIAGNOSIS — L97309 Non-pressure chronic ulcer of unspecified ankle with unspecified severity: Secondary | ICD-10-CM | POA: Diagnosis not present

## 2014-04-24 DIAGNOSIS — L97409 Non-pressure chronic ulcer of unspecified heel and midfoot with unspecified severity: Secondary | ICD-10-CM | POA: Diagnosis not present

## 2014-04-24 LAB — GLUCOSE, CAPILLARY: Glucose-Capillary: 134 mg/dL — ABNORMAL HIGH (ref 70–99)

## 2014-04-26 DIAGNOSIS — I509 Heart failure, unspecified: Secondary | ICD-10-CM | POA: Diagnosis not present

## 2014-04-26 DIAGNOSIS — I872 Venous insufficiency (chronic) (peripheral): Secondary | ICD-10-CM | POA: Diagnosis not present

## 2014-04-26 DIAGNOSIS — L97809 Non-pressure chronic ulcer of other part of unspecified lower leg with unspecified severity: Secondary | ICD-10-CM | POA: Diagnosis not present

## 2014-04-26 DIAGNOSIS — E119 Type 2 diabetes mellitus without complications: Secondary | ICD-10-CM | POA: Diagnosis not present

## 2014-04-26 DIAGNOSIS — I10 Essential (primary) hypertension: Secondary | ICD-10-CM | POA: Diagnosis not present

## 2014-04-29 ENCOUNTER — Ambulatory Visit (INDEPENDENT_AMBULATORY_CARE_PROVIDER_SITE_OTHER): Payer: Medicare Other | Admitting: *Deleted

## 2014-04-29 DIAGNOSIS — E119 Type 2 diabetes mellitus without complications: Secondary | ICD-10-CM | POA: Diagnosis not present

## 2014-04-29 DIAGNOSIS — I10 Essential (primary) hypertension: Secondary | ICD-10-CM | POA: Diagnosis not present

## 2014-04-29 DIAGNOSIS — I6350 Cerebral infarction due to unspecified occlusion or stenosis of unspecified cerebral artery: Secondary | ICD-10-CM

## 2014-04-29 DIAGNOSIS — I872 Venous insufficiency (chronic) (peripheral): Secondary | ICD-10-CM | POA: Diagnosis not present

## 2014-04-29 DIAGNOSIS — Z5181 Encounter for therapeutic drug level monitoring: Secondary | ICD-10-CM

## 2014-04-29 DIAGNOSIS — Z7901 Long term (current) use of anticoagulants: Secondary | ICD-10-CM | POA: Diagnosis not present

## 2014-04-29 DIAGNOSIS — L97809 Non-pressure chronic ulcer of other part of unspecified lower leg with unspecified severity: Secondary | ICD-10-CM | POA: Diagnosis not present

## 2014-04-29 DIAGNOSIS — I509 Heart failure, unspecified: Secondary | ICD-10-CM | POA: Diagnosis not present

## 2014-04-29 LAB — POCT INR: INR: 2.2

## 2014-05-01 DIAGNOSIS — L97309 Non-pressure chronic ulcer of unspecified ankle with unspecified severity: Secondary | ICD-10-CM | POA: Diagnosis not present

## 2014-05-01 DIAGNOSIS — E1169 Type 2 diabetes mellitus with other specified complication: Secondary | ICD-10-CM | POA: Diagnosis not present

## 2014-05-01 DIAGNOSIS — L97409 Non-pressure chronic ulcer of unspecified heel and midfoot with unspecified severity: Secondary | ICD-10-CM | POA: Diagnosis not present

## 2014-05-03 DIAGNOSIS — I872 Venous insufficiency (chronic) (peripheral): Secondary | ICD-10-CM | POA: Diagnosis not present

## 2014-05-03 DIAGNOSIS — I10 Essential (primary) hypertension: Secondary | ICD-10-CM | POA: Diagnosis not present

## 2014-05-03 DIAGNOSIS — I509 Heart failure, unspecified: Secondary | ICD-10-CM | POA: Diagnosis not present

## 2014-05-03 DIAGNOSIS — E119 Type 2 diabetes mellitus without complications: Secondary | ICD-10-CM | POA: Diagnosis not present

## 2014-05-03 DIAGNOSIS — L97809 Non-pressure chronic ulcer of other part of unspecified lower leg with unspecified severity: Secondary | ICD-10-CM | POA: Diagnosis not present

## 2014-05-06 ENCOUNTER — Other Ambulatory Visit: Payer: Self-pay | Admitting: Internal Medicine

## 2014-05-06 DIAGNOSIS — E119 Type 2 diabetes mellitus without complications: Secondary | ICD-10-CM | POA: Diagnosis not present

## 2014-05-06 DIAGNOSIS — I872 Venous insufficiency (chronic) (peripheral): Secondary | ICD-10-CM | POA: Diagnosis not present

## 2014-05-06 DIAGNOSIS — I509 Heart failure, unspecified: Secondary | ICD-10-CM | POA: Diagnosis not present

## 2014-05-06 DIAGNOSIS — I10 Essential (primary) hypertension: Secondary | ICD-10-CM | POA: Diagnosis not present

## 2014-05-08 DIAGNOSIS — L97309 Non-pressure chronic ulcer of unspecified ankle with unspecified severity: Secondary | ICD-10-CM | POA: Diagnosis not present

## 2014-05-08 DIAGNOSIS — L97409 Non-pressure chronic ulcer of unspecified heel and midfoot with unspecified severity: Secondary | ICD-10-CM | POA: Diagnosis not present

## 2014-05-08 DIAGNOSIS — E1169 Type 2 diabetes mellitus with other specified complication: Secondary | ICD-10-CM | POA: Diagnosis not present

## 2014-05-08 LAB — GLUCOSE, CAPILLARY: Glucose-Capillary: 145 mg/dL — ABNORMAL HIGH (ref 70–99)

## 2014-05-10 ENCOUNTER — Telehealth: Payer: Self-pay | Admitting: Internal Medicine

## 2014-05-10 DIAGNOSIS — I872 Venous insufficiency (chronic) (peripheral): Secondary | ICD-10-CM | POA: Diagnosis not present

## 2014-05-10 DIAGNOSIS — I509 Heart failure, unspecified: Secondary | ICD-10-CM | POA: Diagnosis not present

## 2014-05-10 DIAGNOSIS — I10 Essential (primary) hypertension: Secondary | ICD-10-CM | POA: Diagnosis not present

## 2014-05-10 DIAGNOSIS — E119 Type 2 diabetes mellitus without complications: Secondary | ICD-10-CM | POA: Diagnosis not present

## 2014-05-10 NOTE — Telephone Encounter (Signed)
Patient Information:  Caller Name: [name]  Phone: [phone]  Patient: Frank Mclean, Frank Mclean  Gender: Male  DOB: Jun 26, 1941  Age: 73 Years  PCP: Plotnikov, Alex (Adults only)  Office Follow Up:  Does the office need to follow up with this patient?: Yes  Instructions For The Office: Advised wife to start a blood pressure diary.  Patient has not had his second or third dose of Hydralazine and has not had second dose of coreg.  Advised wife to closely mointor. Call back with questions, changes or concerns.  Please schedule patient per Triage to see within 2 weeks.  RN Note:  Advised wife to start a blood pressure diary.  Patient has not had his second or third dose of Hydralazine and has not had second dose of coreg.  Advised wife to closely mointor. Call back with questions, changes or concerns.  Please schedule patient per Triage to see within 2 weeks.  Symptoms  Reason For Call & Symptoms: Wife and husband on the phone. She states Wound Nurse came today to check his wound on his leg . Leg injury wound/Diabetes.   She checked his   B/P- 104/60  (10:00 am) He had not had morning medication. She fed him and  She rechecked his 157/101. She gave him his medications.  Now 162/107.   He is currently taking - Hydralazine 50mg  tid (taken 1 today,  Coreg 6.25 mg bid (taken 1 today), Norvasc 10mg  daily  Reviewed Health History In EMR: Yes  Reviewed Medications In EMR: Yes  Reviewed Allergies In EMR: Yes  Reviewed Surgeries / Procedures: Yes  Date of Onset of Symptoms: 05/10/2014  Guideline(s) Used:  High Blood Pressure  Disposition Per Guideline:   See Within 2 Weeks in Office  Reason For Disposition Reached:   BP > 160/100  Advice Given:  BP less than 120 / 80   This is considered normal blood pressure  How Much Sodium (Salt) Should You Eat Each Day?  Aim to eat less than 1,500 mg of sodium each day.  Remember that one teaspoon of salt has 2,300 mg of sodium.  Unfortunately, 75% of the salt in  the average person's diet is in pre-processed foods.  How To Reduce Your Sodium (Salt) Intake - DO NOT:  Buy or eat heavily salted foods. Examples include pickled foods, salted crackers or chips, and processed meats.  Add salt while cooking or at the table.  What Is A Healthy Diet?  Eat a variety of vegetables and fruits. Aim for five servings per day.  Eat a variety of grains. Aim for six servings per day.  Eat fish at least two times each week. Fatty fish such as salmon and tuna are best.  Call Back If:  Headache, blurred vision, difficulty talking, or difficulty walking occurs  Chest pain or difficulty breathing occurs  You want to go in to the office for a blood pressure check  You become worse.  RN Overrode Recommendation:  Make Appointment  Advised wife to start a blood pressure diary.  Patient has not had his second or third dose of Hydralazine and has not had second dose of coreg.  Advised wife to closely mointor. Call back with questions, changes or concerns.  Please schedule patient per Triage to see within 2 weeks.

## 2014-05-13 DIAGNOSIS — I10 Essential (primary) hypertension: Secondary | ICD-10-CM | POA: Diagnosis not present

## 2014-05-13 DIAGNOSIS — I509 Heart failure, unspecified: Secondary | ICD-10-CM | POA: Diagnosis not present

## 2014-05-13 DIAGNOSIS — I872 Venous insufficiency (chronic) (peripheral): Secondary | ICD-10-CM | POA: Diagnosis not present

## 2014-05-13 DIAGNOSIS — E119 Type 2 diabetes mellitus without complications: Secondary | ICD-10-CM | POA: Diagnosis not present

## 2014-05-15 DIAGNOSIS — L97409 Non-pressure chronic ulcer of unspecified heel and midfoot with unspecified severity: Secondary | ICD-10-CM | POA: Diagnosis not present

## 2014-05-15 DIAGNOSIS — E1169 Type 2 diabetes mellitus with other specified complication: Secondary | ICD-10-CM | POA: Diagnosis not present

## 2014-05-15 DIAGNOSIS — L97309 Non-pressure chronic ulcer of unspecified ankle with unspecified severity: Secondary | ICD-10-CM | POA: Diagnosis not present

## 2014-05-15 LAB — GLUCOSE, CAPILLARY: GLUCOSE-CAPILLARY: 121 mg/dL — AB (ref 70–99)

## 2014-05-17 DIAGNOSIS — I10 Essential (primary) hypertension: Secondary | ICD-10-CM | POA: Diagnosis not present

## 2014-05-17 DIAGNOSIS — I872 Venous insufficiency (chronic) (peripheral): Secondary | ICD-10-CM | POA: Diagnosis not present

## 2014-05-17 DIAGNOSIS — I509 Heart failure, unspecified: Secondary | ICD-10-CM | POA: Diagnosis not present

## 2014-05-17 DIAGNOSIS — E119 Type 2 diabetes mellitus without complications: Secondary | ICD-10-CM | POA: Diagnosis not present

## 2014-05-20 DIAGNOSIS — E119 Type 2 diabetes mellitus without complications: Secondary | ICD-10-CM | POA: Diagnosis not present

## 2014-05-20 DIAGNOSIS — I872 Venous insufficiency (chronic) (peripheral): Secondary | ICD-10-CM | POA: Diagnosis not present

## 2014-05-20 DIAGNOSIS — I10 Essential (primary) hypertension: Secondary | ICD-10-CM | POA: Diagnosis not present

## 2014-05-20 DIAGNOSIS — I509 Heart failure, unspecified: Secondary | ICD-10-CM | POA: Diagnosis not present

## 2014-05-22 ENCOUNTER — Encounter (HOSPITAL_BASED_OUTPATIENT_CLINIC_OR_DEPARTMENT_OTHER): Payer: Medicare Other | Attending: General Surgery

## 2014-05-22 DIAGNOSIS — L97309 Non-pressure chronic ulcer of unspecified ankle with unspecified severity: Secondary | ICD-10-CM | POA: Diagnosis not present

## 2014-05-22 DIAGNOSIS — E1169 Type 2 diabetes mellitus with other specified complication: Secondary | ICD-10-CM | POA: Insufficient documentation

## 2014-05-22 LAB — GLUCOSE, CAPILLARY: Glucose-Capillary: 107 mg/dL — ABNORMAL HIGH (ref 70–99)

## 2014-05-24 DIAGNOSIS — E119 Type 2 diabetes mellitus without complications: Secondary | ICD-10-CM | POA: Diagnosis not present

## 2014-05-24 DIAGNOSIS — I872 Venous insufficiency (chronic) (peripheral): Secondary | ICD-10-CM | POA: Diagnosis not present

## 2014-05-24 DIAGNOSIS — I10 Essential (primary) hypertension: Secondary | ICD-10-CM | POA: Diagnosis not present

## 2014-05-24 DIAGNOSIS — I509 Heart failure, unspecified: Secondary | ICD-10-CM | POA: Diagnosis not present

## 2014-05-27 DIAGNOSIS — I509 Heart failure, unspecified: Secondary | ICD-10-CM | POA: Diagnosis not present

## 2014-05-27 DIAGNOSIS — I10 Essential (primary) hypertension: Secondary | ICD-10-CM | POA: Diagnosis not present

## 2014-05-27 DIAGNOSIS — E119 Type 2 diabetes mellitus without complications: Secondary | ICD-10-CM | POA: Diagnosis not present

## 2014-05-27 DIAGNOSIS — I872 Venous insufficiency (chronic) (peripheral): Secondary | ICD-10-CM | POA: Diagnosis not present

## 2014-05-29 DIAGNOSIS — L97309 Non-pressure chronic ulcer of unspecified ankle with unspecified severity: Secondary | ICD-10-CM | POA: Diagnosis not present

## 2014-05-29 DIAGNOSIS — E1169 Type 2 diabetes mellitus with other specified complication: Secondary | ICD-10-CM | POA: Diagnosis not present

## 2014-05-29 LAB — GLUCOSE, CAPILLARY: Glucose-Capillary: 109 mg/dL — ABNORMAL HIGH (ref 70–99)

## 2014-05-31 DIAGNOSIS — I872 Venous insufficiency (chronic) (peripheral): Secondary | ICD-10-CM | POA: Diagnosis not present

## 2014-05-31 DIAGNOSIS — I509 Heart failure, unspecified: Secondary | ICD-10-CM | POA: Diagnosis not present

## 2014-05-31 DIAGNOSIS — I10 Essential (primary) hypertension: Secondary | ICD-10-CM | POA: Diagnosis not present

## 2014-05-31 DIAGNOSIS — E119 Type 2 diabetes mellitus without complications: Secondary | ICD-10-CM | POA: Diagnosis not present

## 2014-06-03 ENCOUNTER — Other Ambulatory Visit (INDEPENDENT_AMBULATORY_CARE_PROVIDER_SITE_OTHER): Payer: Medicare Other

## 2014-06-03 ENCOUNTER — Ambulatory Visit (INDEPENDENT_AMBULATORY_CARE_PROVIDER_SITE_OTHER): Payer: Medicare Other | Admitting: Physician Assistant

## 2014-06-03 ENCOUNTER — Ambulatory Visit (INDEPENDENT_AMBULATORY_CARE_PROVIDER_SITE_OTHER): Payer: Medicare Other | Admitting: *Deleted

## 2014-06-03 ENCOUNTER — Other Ambulatory Visit: Payer: Self-pay | Admitting: Internal Medicine

## 2014-06-03 ENCOUNTER — Encounter: Payer: Self-pay | Admitting: Physician Assistant

## 2014-06-03 VITALS — BP 137/85 | HR 56 | Ht 74.0 in | Wt 226.0 lb

## 2014-06-03 DIAGNOSIS — I428 Other cardiomyopathies: Secondary | ICD-10-CM

## 2014-06-03 DIAGNOSIS — I5042 Chronic combined systolic (congestive) and diastolic (congestive) heart failure: Secondary | ICD-10-CM

## 2014-06-03 DIAGNOSIS — I509 Heart failure, unspecified: Secondary | ICD-10-CM | POA: Diagnosis not present

## 2014-06-03 DIAGNOSIS — I10 Essential (primary) hypertension: Secondary | ICD-10-CM

## 2014-06-03 DIAGNOSIS — I6350 Cerebral infarction due to unspecified occlusion or stenosis of unspecified cerebral artery: Secondary | ICD-10-CM

## 2014-06-03 DIAGNOSIS — N183 Chronic kidney disease, stage 3 unspecified: Secondary | ICD-10-CM | POA: Diagnosis not present

## 2014-06-03 DIAGNOSIS — I2510 Atherosclerotic heart disease of native coronary artery without angina pectoris: Secondary | ICD-10-CM

## 2014-06-03 DIAGNOSIS — Z7901 Long term (current) use of anticoagulants: Secondary | ICD-10-CM | POA: Diagnosis not present

## 2014-06-03 DIAGNOSIS — Z5181 Encounter for therapeutic drug level monitoring: Secondary | ICD-10-CM

## 2014-06-03 DIAGNOSIS — E119 Type 2 diabetes mellitus without complications: Secondary | ICD-10-CM | POA: Diagnosis not present

## 2014-06-03 DIAGNOSIS — I872 Venous insufficiency (chronic) (peripheral): Secondary | ICD-10-CM | POA: Diagnosis not present

## 2014-06-03 LAB — BASIC METABOLIC PANEL
BUN: 21 mg/dL (ref 6–23)
CHLORIDE: 107 meq/L (ref 96–112)
CO2: 32 mEq/L (ref 19–32)
Calcium: 9.2 mg/dL (ref 8.4–10.5)
Creatinine, Ser: 2.4 mg/dL — ABNORMAL HIGH (ref 0.4–1.5)
GFR: 34.11 mL/min — ABNORMAL LOW (ref >60.00)
Glucose, Bld: 78 mg/dL (ref 70–99)
POTASSIUM: 4 meq/L (ref 3.5–5.1)
Sodium: 146 mEq/L — ABNORMAL HIGH (ref 135–145)

## 2014-06-03 LAB — COMPREHENSIVE METABOLIC PANEL
ALT: 24 U/L (ref 0–53)
AST: 20 U/L (ref 0–37)
Albumin: 4.2 g/dL (ref 3.5–5.2)
Alkaline Phosphatase: 96 U/L (ref 39–117)
BILIRUBIN TOTAL: 0.6 mg/dL (ref 0.2–1.2)
BUN: 21 mg/dL (ref 6–23)
CHLORIDE: 107 meq/L (ref 96–112)
CO2: 32 meq/L (ref 19–32)
CREATININE: 2.4 mg/dL — AB (ref 0.4–1.5)
Calcium: 9.2 mg/dL (ref 8.4–10.5)
GFR: 34.11 mL/min — AB (ref >60.00)
Glucose, Bld: 78 mg/dL (ref 70–99)
Potassium: 4 mEq/L (ref 3.5–5.1)
Sodium: 146 mEq/L — ABNORMAL HIGH (ref 135–145)
Total Protein: 6.8 g/dL (ref 6.0–8.3)

## 2014-06-03 LAB — POCT INR: INR: 2.8

## 2014-06-03 NOTE — Patient Instructions (Signed)
You have been referred to CARDIAC REHAB AT [facility] (MAITANCE PHASE)  LAB WORK TODAY; CMET  MAKE SURE TO FOLLOW UP WITH DR. HOCHREIN 07/2014

## 2014-06-03 NOTE — Progress Notes (Signed)
Cardiology Office Note    Date:  06/03/2014   ID:  Chavon, Hoh [birth year], MRN [id number]  PCP:  [name], MD  Cardiologist:  Dr. Rollene Rotunda     History of Present Illness: Frank Mclean is a 73 y.o. male with a hx of chronic combined systolic and diastolic CHF, NICM (EF 25-30%), nonobstructive CAD (cath 2012), AFib, CAD, HTN, HL, PAD.   Admitted 05/2013 for AFib with RVR complicated by type II non-STEMI and a/c combined systolic and diastolic CHF. He was placed on amiodarone at that time with restoration of NSR.   Admitted 08/2013 for a/c renal failure.   He was felt to be dehydrated and he was gently hydrated with IVFs ("warm and dry") and supported with low dose dopamine.       Admitted 11/2013 with acute respiratory failure secondary to community acquired pneumonia complicated by decompensated heart failure.   I saw him in 03/2014. Volume remained stable. He returns for follow up.  He continues to do well.  He denies chest pain, orthopnea, PND, syncope.  His breathing is stable.  He is NYHA 2-2b.  Denies LE edema.  Leg wrap on the R is about to come off (wound is healing well).  He would like to get into some type of exercise program if possible.   Recent Labs: 11/26/2013: ALT 12; Hemoglobin 10.0*  04/01/2014: Pro B Natriuretic peptide (BNP) 138.0*; TSH 3.31  04/15/2014: Creatinine 2.4*; Potassium 4.0    Wt Readings from Last 3 Encounters:  06/03/14 226 lb (102.513 kg)  04/01/14 224 lb (101.606 kg)  03/04/14 223 lb (101.152 kg)     Past Medical History  Diagnosis Date  . Gout   . HTN (hypertension)   . Hyperlipidemia   . Osteoarthritis   . History of CVA (cerebrovascular accident)     Left pontine infarct July 2004; changed from Plavix to Coumadin in 2004 per MD at [facility]  . GERD (gastroesophageal reflux disease)   . DJD (degenerative joint disease)   . BPH (benign prostatic hypertrophy)   . DM (diabetes mellitus), type 2   . Peripheral vascular  disease   . Bilateral leg ulcer     ACHILLES AREA--  NONHEALING  . CKD (chronic kidney disease) stage 3, GFR 30-59 ml/min   . CAD (coronary artery disease)     a. LHC 4/12: Mid LAD 25%, mid diagonal 30%, AV circumflex 40%, proximal OM 25%, distal RCA 40%  . Chronic combined systolic and diastolic heart failure     a. Echo 05/2013: EF 25-30%, diffuse HK, restrictive physiology, trivial AI, mild MR, moderate LAE, reduced RV systolic function, PASP 42  . LBBB (left bundle branch block)   . Atrial fibrillation     a. amiodarone Rx started 05/2013;  b. chronic coumadin  . NICM (nonischemic cardiomyopathy)     a. EF 25-30%.    Current Outpatient Prescriptions  Medication Sig Dispense Refill  . amiodarone (PACERONE) 200 MG tablet TAKE 1 TABLET (200 MG TOTAL) BY MOUTH DAILY.  30 tablet  0  . amLODipine (NORVASC) 10 MG tablet Take 10 mg by mouth daily.      [name] atorvastatin (LIPITOR) 20 MG tablet Take 20 mg by mouth at bedtime.      . budesonide-formoterol (SYMBICORT) 160-4.5 MCG/ACT inhaler Inhale 2 puffs into the lungs daily as needed (shortness of breath).      . carvedilol (COREG) 6.25 MG tablet TAKE 1 TABLET (6.25 MG TOTAL) BY MOUTH  2 (TWO) TIMES DAILY WITH A MEAL.  60 tablet  5  . cholecalciferol (VITAMIN D) 1000 UNITS tablet Take 1,000 Units by mouth daily.      . collagenase (SANTYL) ointment Apply 1 application topically 2 (two) times a week. On Monday and Friday at home (for wounds on legs) - applied Wednesday at the wound center      . CVS VITAMIN B12 1000 MCG tablet TAKE 1 TABLET BY MOUTH DAILY  100 tablet  3  . ferrous sulfate 325 (65 FE) MG tablet TAKE 1 TABLET BY MOUTH EVERY DAY  30 tablet  6  . glipiZIDE (GLUCOTROL) 5 MG tablet TAKE 1/2 TABLET BY MOUTH TWICE A DAY BEFORE A MEAL  30 tablet  3  . hydrALAZINE (APRESOLINE) 50 MG tablet Take 50 mg by mouth 3 (three) times daily.      [name]. HYDROcodone-acetaminophen (NORCO/VICODIN) 5-325 MG per tablet Take 1 tablet by mouth 2 (two) times daily  as needed.  60 tablet  0  . isosorbide mononitrate (IMDUR) 60 MG 24 hr tablet TAKE 1 TABLET (60 MG TOTAL) BY MOUTH DAILY.  30 tablet  5  . KLOR-CON M20 20 MEQ tablet Take 20 mEq by mouth daily. TAKE 3 TABS IN THE A.M. and 4 TABS IN THE AFTERNOON      . metolazone (ZAROXOLYN) 5 MG tablet Take 5 mg by mouth daily. As directed      . torsemide (DEMADEX) 20 MG tablet Take 2 tablets (40 mg total) by mouth 2 (two) times daily.  120 tablet  11  . triamcinolone cream (KENALOG) 0.5 % Apply 1 application topically 2 (two) times daily as needed (for skin breakdown).  30 g  0  . warfarin (COUMADIN) 5 MG tablet TAKE 1 TABLET (5 MG TOTAL) BY MOUTH AS DIRECTED.  40 tablet  2   No current facility-administered medications for this visit.    Allergies:   No Known Allergies  Social History:  The patient  reports that he quit smoking about 40 years ago. He has never used smokeless tobacco. He reports that he does not drink alcohol or use illicit drugs.    Family History:  [name] family history includes Diabetes in his mother; Gout in his other; Heart disease in his father and mother; Hypertension in his father and mother; Stroke in his other.   ROS:  Please see the history of present illness.  No bleeding problems.  He has noted a long hx of urinary hesitancy and nocturia.   All other systems reviewed and negative.   PHYSICAL EXAM: VS:  BP 137/85  Pulse 56  Ht 6\' 2"  (1.88 m)  Wt 226 lb (102.513 kg)  BMI 29.00 kg/m2 Well nourished, well developed, in no acute distress HEENT: normal Neck: no JVD   Cardiac:  normal S1, S2; RRR; no murmur; no rub Lungs:  Decreased breath sounds bilaterally, no wheezing, rhonchi or rales Abd: soft, nontender, no hepatomegaly Ext: no LE edema; RLE wrap in place Skin: warm and dry Neuro:  CNs 2-12 intact, face is symmetrical  EKG:   Sinus brady, HR 56, LBBB   ASSESSMENT AND PLAN:  1. Chronic Combined Systolic and Diastolic CHF:   Volume remains stable.  Check  f/u CMET today.  He would like to exercise some and is interested in cardiac rehab.  Will refer him to see if we can get him in to a maintenance program.  2. Non-Ischemic CM:  Consider candidacy for ICD in future  once leg wounds completely healed.  Continue current dose of beta blocker.  He is not on ACEI due to CKD.  Continue hydralazine and nitrates.   3. CAD:  No angina. He is not on aspirin as he is on Coumadin. Continue statin. 4. Atrial Fibrillation:  Maintaining NSR.  He remains on coumadin and Amiodarone.  Recent TSH ok.  Check CMET today. 5. Chronic Kidney Disease:   Check CMET today.    6. Hypertension:  Controlled.  7. Hyperlipidemia:  Continue statin. 8. Lower Extremity Ulcers:  Continue f/u with the wound clinic. 9. Disposition: Follow up with Dr. Rollene Rotunda in 07/2014.     Signed, Brynda Rim, MHS 06/03/2014 11:23 AM    [facility] [address] [place], [place], [place]  [address] Phone: [phone]; Fax: [phone]

## 2014-06-04 ENCOUNTER — Telehealth: Payer: Self-pay | Admitting: *Deleted

## 2014-06-04 NOTE — Telephone Encounter (Signed)
s/w pt about his lab results, he asked for me to call his wife as well on her cell phone. I lm on wife's cell tcb go over lab results.

## 2014-06-04 NOTE — Telephone Encounter (Signed)
pt's wife notified about lab results.

## 2014-06-05 DIAGNOSIS — E1169 Type 2 diabetes mellitus with other specified complication: Secondary | ICD-10-CM | POA: Diagnosis not present

## 2014-06-05 DIAGNOSIS — L97309 Non-pressure chronic ulcer of unspecified ankle with unspecified severity: Secondary | ICD-10-CM | POA: Diagnosis not present

## 2014-06-05 LAB — GLUCOSE, CAPILLARY: GLUCOSE-CAPILLARY: 94 mg/dL (ref 70–99)

## 2014-06-07 ENCOUNTER — Telehealth: Payer: Self-pay | Admitting: Cardiology

## 2014-06-07 DIAGNOSIS — E119 Type 2 diabetes mellitus without complications: Secondary | ICD-10-CM | POA: Diagnosis not present

## 2014-06-07 DIAGNOSIS — N183 Chronic kidney disease, stage 3 unspecified: Secondary | ICD-10-CM

## 2014-06-07 DIAGNOSIS — Z79899 Other long term (current) drug therapy: Secondary | ICD-10-CM

## 2014-06-07 DIAGNOSIS — I509 Heart failure, unspecified: Secondary | ICD-10-CM

## 2014-06-07 DIAGNOSIS — I872 Venous insufficiency (chronic) (peripheral): Secondary | ICD-10-CM | POA: Diagnosis not present

## 2014-06-07 DIAGNOSIS — I10 Essential (primary) hypertension: Secondary | ICD-10-CM | POA: Diagnosis not present

## 2014-06-07 NOTE — Telephone Encounter (Signed)
Spoke with wife who states weight is up 5 lbs over night and his face is swollen.  He has no SOB or edema at feet and ankles.  She gave him extra Torsemide today and pt reports increase in urine output.  Advised to continue daily wts, reduce NA intake as much as possible.  Advised if wt is still elevated tomorrow he should take the Metolazone as Rxed.  If he starts having any other s/s over the weekend they will call the MD on call.  Pt will come into office on Wednesday for BMP.  Wife states understanding and is in agreement with plan.

## 2014-06-07 NOTE — Telephone Encounter (Signed)
Returned call to [facility] to discuss pts wt gain and facial swelling.  She doesn't know what medication pt took extra and doesn't know if he has had any increase if urine outpt since taking it.  Called to the home and left a voice mail to call back.  Called the wife's cell number and received a message that the mailbox has not been set up yet.

## 2014-06-07 NOTE — Telephone Encounter (Signed)
New message     FYI Pt has facial swelling and 5lbs wt gain since wed.  He took an extra fluid pill.

## 2014-06-07 NOTE — Telephone Encounter (Signed)
F/u   Please call pt's wife on her cell. Returning your call.

## 2014-06-10 DIAGNOSIS — I872 Venous insufficiency (chronic) (peripheral): Secondary | ICD-10-CM | POA: Diagnosis not present

## 2014-06-10 DIAGNOSIS — I509 Heart failure, unspecified: Secondary | ICD-10-CM | POA: Diagnosis not present

## 2014-06-10 DIAGNOSIS — E119 Type 2 diabetes mellitus without complications: Secondary | ICD-10-CM | POA: Diagnosis not present

## 2014-06-10 DIAGNOSIS — I10 Essential (primary) hypertension: Secondary | ICD-10-CM | POA: Diagnosis not present

## 2014-06-12 ENCOUNTER — Other Ambulatory Visit: Payer: Medicare Other

## 2014-06-12 DIAGNOSIS — L97309 Non-pressure chronic ulcer of unspecified ankle with unspecified severity: Secondary | ICD-10-CM | POA: Diagnosis not present

## 2014-06-12 DIAGNOSIS — E1169 Type 2 diabetes mellitus with other specified complication: Secondary | ICD-10-CM | POA: Diagnosis not present

## 2014-06-12 LAB — GLUCOSE, CAPILLARY: GLUCOSE-CAPILLARY: 139 mg/dL — AB (ref 70–99)

## 2014-06-17 ENCOUNTER — Telehealth: Payer: Self-pay | Admitting: Cardiology

## 2014-06-17 DIAGNOSIS — I509 Heart failure, unspecified: Secondary | ICD-10-CM | POA: Diagnosis not present

## 2014-06-17 DIAGNOSIS — E119 Type 2 diabetes mellitus without complications: Secondary | ICD-10-CM | POA: Diagnosis not present

## 2014-06-17 DIAGNOSIS — I872 Venous insufficiency (chronic) (peripheral): Secondary | ICD-10-CM | POA: Diagnosis not present

## 2014-06-17 DIAGNOSIS — I10 Essential (primary) hypertension: Secondary | ICD-10-CM

## 2014-06-17 NOTE — Telephone Encounter (Signed)
New message           Pt has gained 8 pounds over the course of 5 days / it has went down but not tremendously / pt is requesting call from [facility] nurse

## 2014-06-17 NOTE — Telephone Encounter (Signed)
Returned call to patient's wife she stated husband has gained 8 lbs within 5 days.Stated he has increase swelling in abdomen and face.No sob.Dr.Hochrein out of office.Spoke to DOD Dr.Nelson she advised to increase Metolazone 5 mg daily for 5 days only .Bmet to be done on Monday 06/24/14.

## 2014-06-17 NOTE — Addendum Note (Signed)
Addended by: Meda Klinefelter D on: 06/17/2014 12:58 PM   Modules accepted: Orders

## 2014-06-17 NOTE — Telephone Encounter (Signed)
Sent message to Triage

## 2014-06-18 ENCOUNTER — Telehealth: Payer: Self-pay | Admitting: *Deleted

## 2014-06-18 DIAGNOSIS — E1129 Type 2 diabetes mellitus with other diabetic kidney complication: Secondary | ICD-10-CM

## 2014-06-18 IMAGING — CR DG CHEST 1V PORT
2 series · 2 of 2 positions shown · non-contrast
Comparison: 12/29/2012

CLINICAL DATA: Chest pain, shortness of breath.

PORTABLE CHEST - 1 VIEW

[AP (1 of 2)]
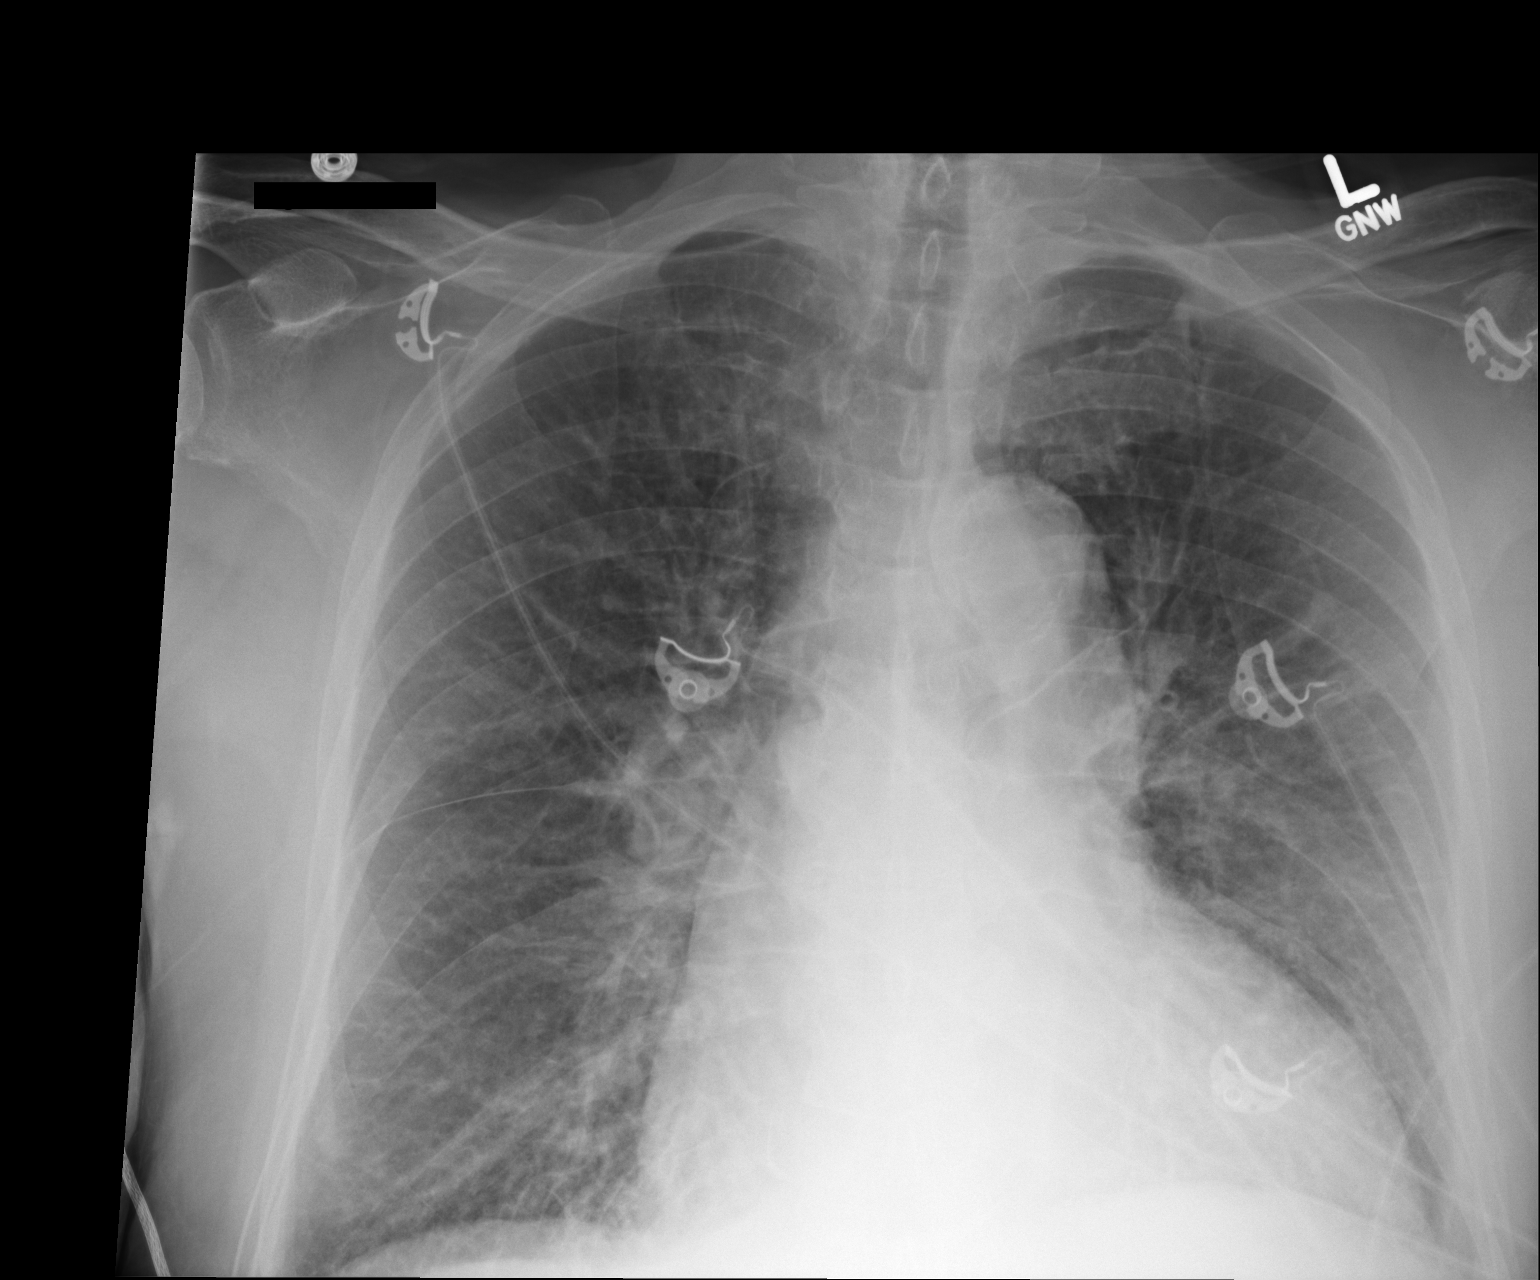

[AP (2 of 2)]
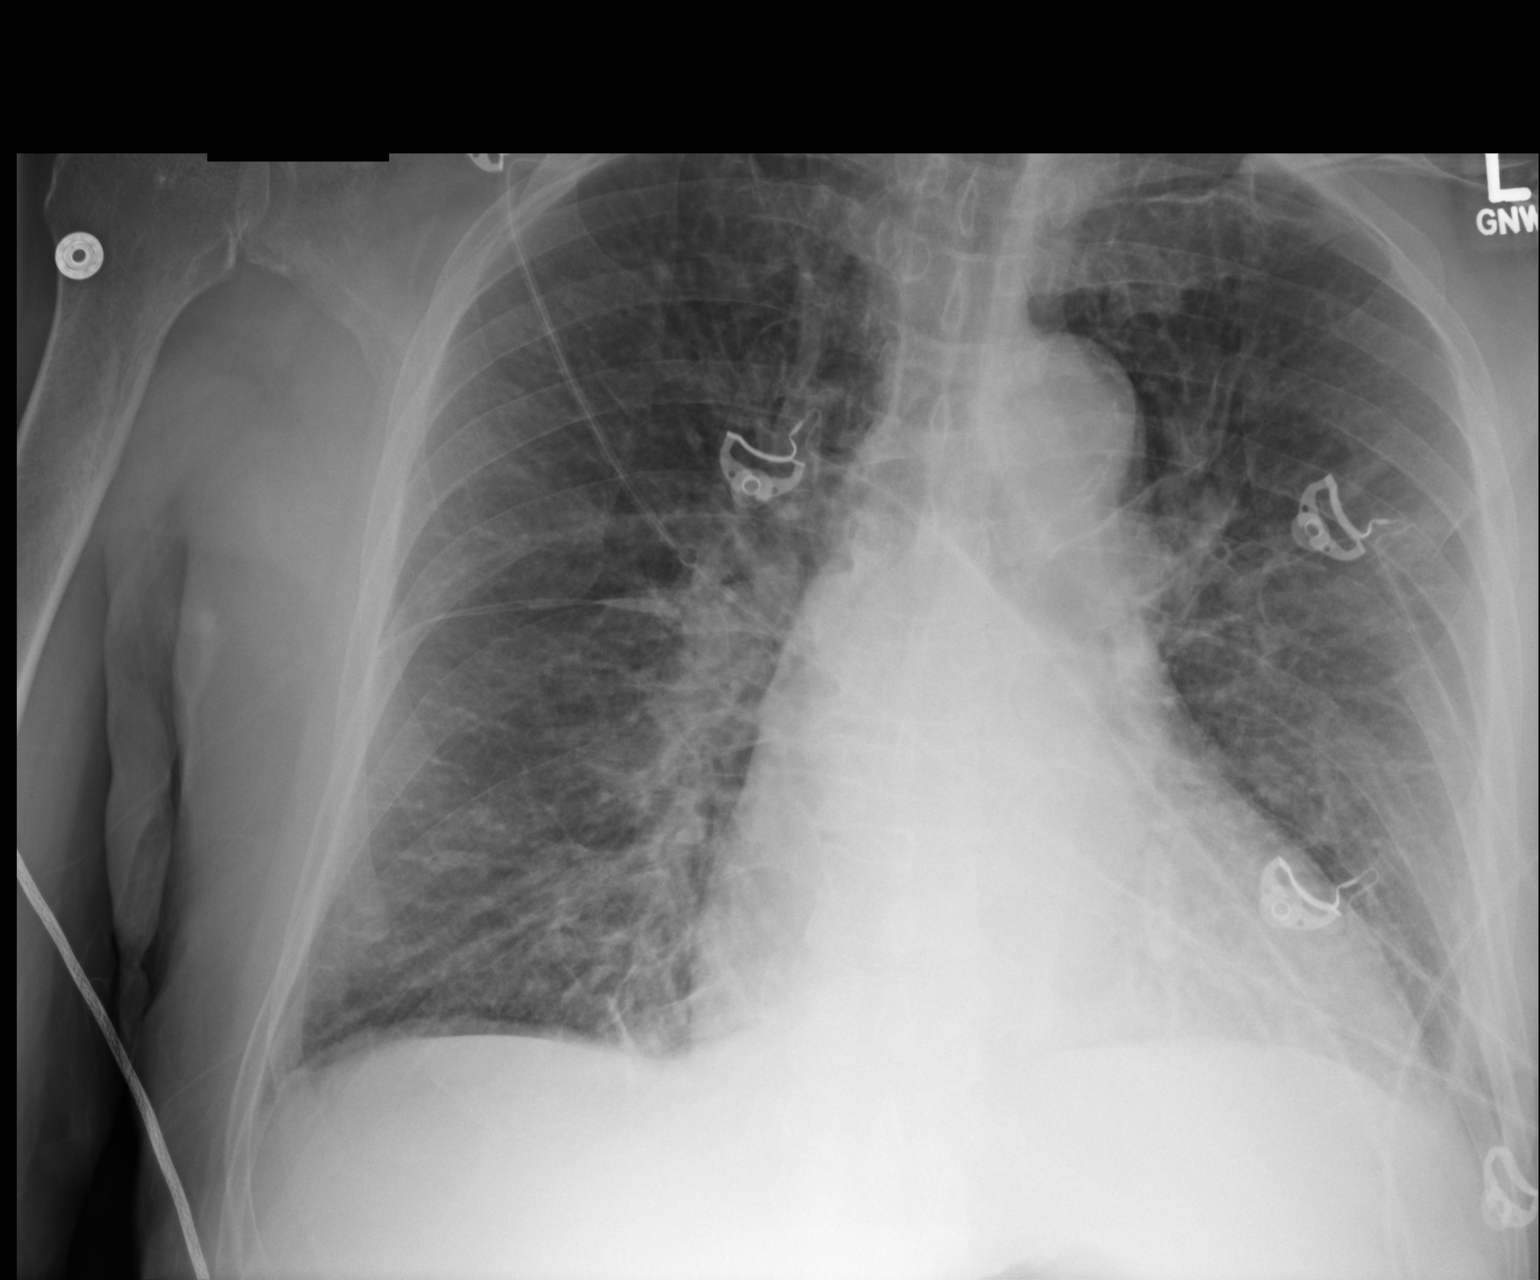

[2 of 2 positions shown; findings below may reference images not displayed]

FINDINGS: Cardiomegaly.  Diffuse interstitial prominence,
increasing since prior study, likely mild interstitial edema.
Hyperinflation compatible with COPD.  No effusions.
IMPRESSION: Increasing interstitial prominence, likely early interstitial
edema.

COPD.

## 2014-06-18 NOTE — Telephone Encounter (Signed)
Left message on machine for patient to return our call. Lipid, bmet, a1c orders placed.

## 2014-06-19 ENCOUNTER — Encounter (HOSPITAL_BASED_OUTPATIENT_CLINIC_OR_DEPARTMENT_OTHER): Payer: Medicare Other | Attending: General Surgery

## 2014-06-19 ENCOUNTER — Encounter: Payer: Self-pay | Admitting: *Deleted

## 2014-06-19 DIAGNOSIS — L97509 Non-pressure chronic ulcer of other part of unspecified foot with unspecified severity: Secondary | ICD-10-CM | POA: Diagnosis not present

## 2014-06-19 DIAGNOSIS — E1169 Type 2 diabetes mellitus with other specified complication: Secondary | ICD-10-CM | POA: Insufficient documentation

## 2014-06-19 LAB — GLUCOSE, CAPILLARY: Glucose-Capillary: 128 mg/dL — ABNORMAL HIGH (ref 70–99)

## 2014-06-19 NOTE — Telephone Encounter (Signed)
Message sent to mychart

## 2014-06-21 ENCOUNTER — Other Ambulatory Visit: Payer: Self-pay | Admitting: Internal Medicine

## 2014-06-24 ENCOUNTER — Ambulatory Visit: Payer: Medicare Other

## 2014-06-24 ENCOUNTER — Ambulatory Visit (INDEPENDENT_AMBULATORY_CARE_PROVIDER_SITE_OTHER): Payer: Medicare Other | Admitting: *Deleted

## 2014-06-24 ENCOUNTER — Other Ambulatory Visit (INDEPENDENT_AMBULATORY_CARE_PROVIDER_SITE_OTHER): Payer: Medicare Other

## 2014-06-24 ENCOUNTER — Telehealth: Payer: Self-pay | Admitting: *Deleted

## 2014-06-24 DIAGNOSIS — E1129 Type 2 diabetes mellitus with other diabetic kidney complication: Secondary | ICD-10-CM

## 2014-06-24 DIAGNOSIS — Z7901 Long term (current) use of anticoagulants: Secondary | ICD-10-CM | POA: Diagnosis not present

## 2014-06-24 DIAGNOSIS — I6350 Cerebral infarction due to unspecified occlusion or stenosis of unspecified cerebral artery: Secondary | ICD-10-CM | POA: Diagnosis not present

## 2014-06-24 DIAGNOSIS — Z5181 Encounter for therapeutic drug level monitoring: Secondary | ICD-10-CM

## 2014-06-24 DIAGNOSIS — E876 Hypokalemia: Secondary | ICD-10-CM

## 2014-06-24 LAB — BASIC METABOLIC PANEL
BUN: 48 mg/dL — ABNORMAL HIGH (ref 6–23)
CHLORIDE: 98 meq/L (ref 96–112)
CO2: 33 meq/L — AB (ref 19–32)
Calcium: 9.1 mg/dL (ref 8.4–10.5)
Creatinine, Ser: 3.7 mg/dL — ABNORMAL HIGH (ref 0.4–1.5)
GFR: 20.69 mL/min — ABNORMAL LOW (ref >60.00)
Glucose, Bld: 104 mg/dL — ABNORMAL HIGH (ref 70–99)
POTASSIUM: 2.8 meq/L — AB (ref 3.5–5.1)
SODIUM: 144 meq/L (ref 135–145)

## 2014-06-24 LAB — LIPID PANEL
CHOL/HDL RATIO: 5
CHOLESTEROL: 269 mg/dL — AB (ref 0–200)
HDL: 56.5 mg/dL (ref >39.00)
LDL CALC: 186 mg/dL — AB (ref 0–99)
NonHDL: 212.5
TRIGLYCERIDES: 133 mg/dL (ref 0.0–149.0)
VLDL: 26.6 mg/dL (ref 0.0–40.0)

## 2014-06-24 LAB — MAGNESIUM: Magnesium: 2 mg/dL (ref 1.5–2.5)

## 2014-06-24 LAB — POCT INR: INR: 2.4

## 2014-06-24 LAB — HEMOGLOBIN A1C: HEMOGLOBIN A1C: 5.8 % (ref 4.6–6.5)

## 2014-06-24 NOTE — Telephone Encounter (Signed)
Call from [facility] in [place]--pt's Kcl is low at 2.8. MD informed of lab.  I called pt and he states he has been taking Klor Con 20 meq 3 in tab am and 4 in pm. Per MD he wants Magnesium lab added. Pt advised to continue Kcl as is and keep OV as scheduled later this week. Add on sheet faxed to [facility] lab for magnesium.

## 2014-06-26 ENCOUNTER — Encounter: Payer: Self-pay | Admitting: Internal Medicine

## 2014-06-26 ENCOUNTER — Ambulatory Visit (INDEPENDENT_AMBULATORY_CARE_PROVIDER_SITE_OTHER): Payer: Medicare Other | Admitting: Internal Medicine

## 2014-06-26 VITALS — BP 128/84 | HR 80 | Temp 97.8°F | Resp 16 | Wt 225.0 lb

## 2014-06-26 DIAGNOSIS — I2510 Atherosclerotic heart disease of native coronary artery without angina pectoris: Secondary | ICD-10-CM | POA: Diagnosis not present

## 2014-06-26 DIAGNOSIS — E1122 Type 2 diabetes mellitus with diabetic chronic kidney disease: Secondary | ICD-10-CM

## 2014-06-26 DIAGNOSIS — E876 Hypokalemia: Secondary | ICD-10-CM

## 2014-06-26 DIAGNOSIS — N183 Chronic kidney disease, stage 3 unspecified: Secondary | ICD-10-CM

## 2014-06-26 DIAGNOSIS — IMO0001 Reserved for inherently not codable concepts without codable children: Secondary | ICD-10-CM

## 2014-06-26 DIAGNOSIS — J449 Chronic obstructive pulmonary disease, unspecified: Secondary | ICD-10-CM | POA: Diagnosis not present

## 2014-06-26 DIAGNOSIS — N189 Chronic kidney disease, unspecified: Secondary | ICD-10-CM | POA: Diagnosis not present

## 2014-06-26 DIAGNOSIS — I6350 Cerebral infarction due to unspecified occlusion or stenosis of unspecified cerebral artery: Secondary | ICD-10-CM

## 2014-06-26 DIAGNOSIS — I10 Essential (primary) hypertension: Secondary | ICD-10-CM

## 2014-06-26 DIAGNOSIS — E1169 Type 2 diabetes mellitus with other specified complication: Secondary | ICD-10-CM | POA: Diagnosis not present

## 2014-06-26 DIAGNOSIS — I5043 Acute on chronic combined systolic (congestive) and diastolic (congestive) heart failure: Secondary | ICD-10-CM

## 2014-06-26 DIAGNOSIS — E1129 Type 2 diabetes mellitus with other diabetic kidney complication: Secondary | ICD-10-CM

## 2014-06-26 DIAGNOSIS — I509 Heart failure, unspecified: Secondary | ICD-10-CM

## 2014-06-26 DIAGNOSIS — L97509 Non-pressure chronic ulcer of other part of unspecified foot with unspecified severity: Secondary | ICD-10-CM | POA: Diagnosis not present

## 2014-06-26 MED ORDER — TORSEMIDE 20 MG PO TABS: 40.0000 mg | ORAL_TABLET | Freq: Every day | ORAL | Status: DC

## 2014-06-26 NOTE — Progress Notes (Signed)
Patient ID: Frank Mclean, male   DOB: 04/04/1941, 73 y.o.   MRN: [id number] Patient ID: Frank Mclean, male   DOB: 11/18/1941, 73 y.o.   MRN: [id number]   Subjective:    HPI   F/u  73 y.o. male w/ PMHx s/f chronic combined CHF, nonischemic cardiomyoapthy, nonobstructive CAD, PAF (amiodarone and Coumadin), chronic LBBB, DM2, HTN, HLD, PAD, CKD (stage III, baseline Cr 1.9), h/o CVA and chronic venous ulcers who was admitted to [facility] for CAP and decompensated heart failure. On O2 at night 1. Acute on chronic combined CHF/NICM, EF 25-30%  2. Nonobstructive CAD  3. PAF  4. Subtherapeutic INR now therapeutic  5. Type 2 DM  6. HTN  7. HLD  8. H/o CVA  9. CKD, stage III  10. Chronic venous ulcers  11. Hypokalemia  12. PAD  13. Chronic LBBB  14. CAP - L lung Patient improved and was discharged on 12/10.    F/u: Near syncope  Active Problems:  DM (diabetes mellitus), type 2 with renal complications  HYPERLIPIDEMIA  HYPERTENSION  Acute on chronic combined systolic and diastolic congestive heart failure  CVA  CKD (chronic kidney disease) stage 3, GFR 30-59 ml/min  Wound, open, leg  Atherosclerotic peripheral vascular disease with intermittent claudication  Orthostatic hypotension  Hypokalemia  Elevated troponin  Atrial fibrillation with RVR  Wt Readings from Last 3 Encounters:  06/26/14 225 lb (102.059 kg)  06/03/14 226 lb (102.513 kg)  04/01/14 224 lb (101.606 kg)   BP Readings from Last 3 Encounters:  06/26/14 128/84  06/03/14 137/85  04/01/14 142/90      Review of Systems  Constitutional: Positive for fatigue. Negative for appetite change and unexpected weight change.  HENT: Negative for congestion, nosebleeds, sneezing and trouble swallowing.   Eyes: Negative for itching and visual disturbance.  Cardiovascular: Positive for leg swelling. Negative for palpitations.  Gastrointestinal: Negative for nausea, diarrhea, blood in stool and abdominal distention.   Genitourinary: Negative for frequency and hematuria.  Musculoskeletal: Positive for arthralgias. Negative for back pain, gait problem, joint swelling and neck pain.  Neurological: Negative for dizziness, tremors, speech difficulty and weakness.  Psychiatric/Behavioral: Negative for sleep disturbance, dysphoric mood and agitation. The patient is nervous/anxious.        Objective:   Physical Exam  Constitutional: He is oriented to person, place, and time. He appears well-developed.  HENT:  Mouth/Throat: Oropharynx is clear and moist.  Eyes: Conjunctivae are normal. Pupils are equal, round, and reactive to light.  Neck: Normal range of motion. No JVD present. No thyromegaly present.  Cardiovascular: Normal rate, regular rhythm, normal heart sounds and intact distal pulses.  Exam reveals no gallop and no friction rub.   No murmur heard. Pulmonary/Chest: Effort normal and breath sounds normal. No respiratory distress. He has no wheezes. He has no rales. He exhibits no tenderness.  Abdominal: Soft. Bowel sounds are normal. He exhibits no distension and no mass. There is no tenderness. There is no rebound and no guarding.  Musculoskeletal: Normal range of motion. He exhibits no edema and no tenderness.  R trace  L no edema   Lymphadenopathy:    He has no cervical adenopathy.  Neurological: He is alert and oriented to person, place, and time. He has normal reflexes. No cranial nerve deficit. He exhibits normal muscle tone. Coordination normal.  Skin: Skin is warm and dry. No rash noted.    Hyperpigmented ankles  Psychiatric: He has a normal mood and affect. His  behavior is normal. Judgment and thought content normal.  B LEs are wrapped - much better   Lab Results  Component Value Date   WBC 11.5* 11/26/2013   HGB 10.0* 11/26/2013   HCT 32.2* 11/26/2013   PLT 265 11/26/2013   GLUCOSE 104* 06/24/2014   CHOL 269* 06/24/2014   TRIG 133.0 06/24/2014   HDL 56.50 06/24/2014   LDLDIRECT 177.9  06/17/2010   LDLCALC 186* 06/24/2014   ALT 24 06/03/2014   AST 20 06/03/2014   NA 144 06/24/2014   K 2.8* 06/24/2014   CL 98 06/24/2014   CREATININE 3.7* 06/24/2014   BUN 48* 06/24/2014   CO2 33* 06/24/2014   TSH 3.31 04/01/2014   PSA 0.01* 11/18/2011   INR 2.4 06/24/2014   HGBA1C 5.8 06/24/2014     A complex case      Assessment & Plan:

## 2014-06-26 NOTE — Assessment & Plan Note (Addendum)
Continue with current prescription therapy as reflected on the Med list. On O2 at night

## 2014-06-26 NOTE — Assessment & Plan Note (Signed)
Labs

## 2014-06-26 NOTE — Patient Instructions (Signed)
BMET in 7-10 d

## 2014-06-26 NOTE — Assessment & Plan Note (Signed)
Decrease Demadex to 2 tabs qd - not bid Metolazone prn only Cont w/KCl: 3 tabs am, 2 tabs pm Labs in 7-10 d

## 2014-06-26 NOTE — Assessment & Plan Note (Signed)
Continue with current prescription therapy as reflected on the Med list.  

## 2014-06-26 NOTE — Assessment & Plan Note (Signed)
On O2 at night Continue with current prescription therapy as reflected on the Med list.

## 2014-06-26 NOTE — Progress Notes (Signed)
Pre visit review using our clinic review tool, if applicable. No additional management support is needed unless otherwise documented below in the visit note. 

## 2014-07-01 ENCOUNTER — Other Ambulatory Visit: Payer: Self-pay

## 2014-07-01 MED ORDER — POTASSIUM CHLORIDE CRYS ER 20 MEQ PO TBCR: EXTENDED_RELEASE_TABLET | ORAL | Status: DC

## 2014-07-03 DIAGNOSIS — E1169 Type 2 diabetes mellitus with other specified complication: Secondary | ICD-10-CM | POA: Diagnosis not present

## 2014-07-03 DIAGNOSIS — L97509 Non-pressure chronic ulcer of other part of unspecified foot with unspecified severity: Secondary | ICD-10-CM | POA: Diagnosis not present

## 2014-07-03 LAB — GLUCOSE, CAPILLARY: Glucose-Capillary: 174 mg/dL — ABNORMAL HIGH (ref 70–99)

## 2014-07-05 DIAGNOSIS — E119 Type 2 diabetes mellitus without complications: Secondary | ICD-10-CM | POA: Diagnosis not present

## 2014-07-05 DIAGNOSIS — I509 Heart failure, unspecified: Secondary | ICD-10-CM | POA: Diagnosis not present

## 2014-07-05 DIAGNOSIS — I10 Essential (primary) hypertension: Secondary | ICD-10-CM | POA: Diagnosis not present

## 2014-07-05 DIAGNOSIS — I872 Venous insufficiency (chronic) (peripheral): Secondary | ICD-10-CM | POA: Diagnosis not present

## 2014-07-08 ENCOUNTER — Other Ambulatory Visit: Payer: Self-pay | Admitting: Internal Medicine

## 2014-07-10 DIAGNOSIS — E1169 Type 2 diabetes mellitus with other specified complication: Secondary | ICD-10-CM | POA: Diagnosis not present

## 2014-07-10 DIAGNOSIS — L97509 Non-pressure chronic ulcer of other part of unspecified foot with unspecified severity: Secondary | ICD-10-CM | POA: Diagnosis not present

## 2014-07-12 DIAGNOSIS — I509 Heart failure, unspecified: Secondary | ICD-10-CM | POA: Diagnosis not present

## 2014-07-12 DIAGNOSIS — I872 Venous insufficiency (chronic) (peripheral): Secondary | ICD-10-CM | POA: Diagnosis not present

## 2014-07-12 DIAGNOSIS — I10 Essential (primary) hypertension: Secondary | ICD-10-CM | POA: Diagnosis not present

## 2014-07-12 DIAGNOSIS — E119 Type 2 diabetes mellitus without complications: Secondary | ICD-10-CM | POA: Diagnosis not present

## 2014-07-15 DIAGNOSIS — I10 Essential (primary) hypertension: Secondary | ICD-10-CM | POA: Diagnosis not present

## 2014-07-15 DIAGNOSIS — I872 Venous insufficiency (chronic) (peripheral): Secondary | ICD-10-CM | POA: Diagnosis not present

## 2014-07-15 DIAGNOSIS — I509 Heart failure, unspecified: Secondary | ICD-10-CM | POA: Diagnosis not present

## 2014-07-15 DIAGNOSIS — E119 Type 2 diabetes mellitus without complications: Secondary | ICD-10-CM | POA: Diagnosis not present

## 2014-07-17 DIAGNOSIS — L97509 Non-pressure chronic ulcer of other part of unspecified foot with unspecified severity: Secondary | ICD-10-CM | POA: Diagnosis not present

## 2014-07-17 DIAGNOSIS — E1169 Type 2 diabetes mellitus with other specified complication: Secondary | ICD-10-CM | POA: Diagnosis not present

## 2014-07-17 LAB — GLUCOSE, CAPILLARY: GLUCOSE-CAPILLARY: 125 mg/dL — AB (ref 70–99)

## 2014-07-19 DIAGNOSIS — I10 Essential (primary) hypertension: Secondary | ICD-10-CM | POA: Diagnosis not present

## 2014-07-19 DIAGNOSIS — I509 Heart failure, unspecified: Secondary | ICD-10-CM | POA: Diagnosis not present

## 2014-07-19 DIAGNOSIS — I872 Venous insufficiency (chronic) (peripheral): Secondary | ICD-10-CM | POA: Diagnosis not present

## 2014-07-19 DIAGNOSIS — E119 Type 2 diabetes mellitus without complications: Secondary | ICD-10-CM | POA: Diagnosis not present

## 2014-07-22 ENCOUNTER — Ambulatory Visit (INDEPENDENT_AMBULATORY_CARE_PROVIDER_SITE_OTHER): Payer: Medicare Other | Admitting: Pharmacist

## 2014-07-22 DIAGNOSIS — Z5181 Encounter for therapeutic drug level monitoring: Secondary | ICD-10-CM

## 2014-07-22 DIAGNOSIS — I872 Venous insufficiency (chronic) (peripheral): Secondary | ICD-10-CM | POA: Diagnosis not present

## 2014-07-22 DIAGNOSIS — I10 Essential (primary) hypertension: Secondary | ICD-10-CM | POA: Diagnosis not present

## 2014-07-22 DIAGNOSIS — I509 Heart failure, unspecified: Secondary | ICD-10-CM | POA: Diagnosis not present

## 2014-07-22 DIAGNOSIS — I6350 Cerebral infarction due to unspecified occlusion or stenosis of unspecified cerebral artery: Secondary | ICD-10-CM

## 2014-07-22 DIAGNOSIS — Z7901 Long term (current) use of anticoagulants: Secondary | ICD-10-CM

## 2014-07-22 DIAGNOSIS — E119 Type 2 diabetes mellitus without complications: Secondary | ICD-10-CM | POA: Diagnosis not present

## 2014-07-22 LAB — POCT INR: INR: 2.4

## 2014-07-24 ENCOUNTER — Encounter (HOSPITAL_BASED_OUTPATIENT_CLINIC_OR_DEPARTMENT_OTHER): Payer: Medicare Other | Attending: General Surgery

## 2014-07-24 DIAGNOSIS — E1169 Type 2 diabetes mellitus with other specified complication: Secondary | ICD-10-CM | POA: Diagnosis not present

## 2014-07-24 DIAGNOSIS — L97309 Non-pressure chronic ulcer of unspecified ankle with unspecified severity: Secondary | ICD-10-CM | POA: Insufficient documentation

## 2014-07-26 ENCOUNTER — Other Ambulatory Visit: Payer: Self-pay | Admitting: Internal Medicine

## 2014-07-26 DIAGNOSIS — E119 Type 2 diabetes mellitus without complications: Secondary | ICD-10-CM | POA: Diagnosis not present

## 2014-07-26 DIAGNOSIS — I872 Venous insufficiency (chronic) (peripheral): Secondary | ICD-10-CM | POA: Diagnosis not present

## 2014-07-26 DIAGNOSIS — I10 Essential (primary) hypertension: Secondary | ICD-10-CM | POA: Diagnosis not present

## 2014-07-26 DIAGNOSIS — I509 Heart failure, unspecified: Secondary | ICD-10-CM | POA: Diagnosis not present

## 2014-07-29 DIAGNOSIS — I10 Essential (primary) hypertension: Secondary | ICD-10-CM | POA: Diagnosis not present

## 2014-07-29 DIAGNOSIS — I509 Heart failure, unspecified: Secondary | ICD-10-CM | POA: Diagnosis not present

## 2014-07-29 DIAGNOSIS — E119 Type 2 diabetes mellitus without complications: Secondary | ICD-10-CM | POA: Diagnosis not present

## 2014-07-29 DIAGNOSIS — I872 Venous insufficiency (chronic) (peripheral): Secondary | ICD-10-CM | POA: Diagnosis not present

## 2014-07-31 DIAGNOSIS — L97309 Non-pressure chronic ulcer of unspecified ankle with unspecified severity: Secondary | ICD-10-CM | POA: Diagnosis not present

## 2014-07-31 DIAGNOSIS — E1169 Type 2 diabetes mellitus with other specified complication: Secondary | ICD-10-CM | POA: Diagnosis not present

## 2014-07-31 LAB — GLUCOSE, CAPILLARY: GLUCOSE-CAPILLARY: 73 mg/dL (ref 70–99)

## 2014-08-02 DIAGNOSIS — I10 Essential (primary) hypertension: Secondary | ICD-10-CM | POA: Diagnosis not present

## 2014-08-02 DIAGNOSIS — E119 Type 2 diabetes mellitus without complications: Secondary | ICD-10-CM | POA: Diagnosis not present

## 2014-08-02 DIAGNOSIS — I509 Heart failure, unspecified: Secondary | ICD-10-CM | POA: Diagnosis not present

## 2014-08-02 DIAGNOSIS — I872 Venous insufficiency (chronic) (peripheral): Secondary | ICD-10-CM | POA: Diagnosis not present

## 2014-08-05 ENCOUNTER — Ambulatory Visit (INDEPENDENT_AMBULATORY_CARE_PROVIDER_SITE_OTHER): Payer: Medicare Other | Admitting: Cardiology

## 2014-08-05 ENCOUNTER — Other Ambulatory Visit (INDEPENDENT_AMBULATORY_CARE_PROVIDER_SITE_OTHER): Payer: Medicare Other

## 2014-08-05 ENCOUNTER — Encounter: Payer: Self-pay | Admitting: Cardiology

## 2014-08-05 VITALS — BP 132/80 | HR 54 | Ht 74.0 in | Wt 230.0 lb

## 2014-08-05 DIAGNOSIS — I2510 Atherosclerotic heart disease of native coronary artery without angina pectoris: Secondary | ICD-10-CM | POA: Diagnosis not present

## 2014-08-05 DIAGNOSIS — N183 Chronic kidney disease, stage 3 unspecified: Secondary | ICD-10-CM | POA: Diagnosis not present

## 2014-08-05 DIAGNOSIS — I5022 Chronic systolic (congestive) heart failure: Secondary | ICD-10-CM | POA: Diagnosis not present

## 2014-08-05 DIAGNOSIS — I509 Heart failure, unspecified: Secondary | ICD-10-CM

## 2014-08-05 DIAGNOSIS — I4891 Unspecified atrial fibrillation: Secondary | ICD-10-CM | POA: Diagnosis not present

## 2014-08-05 DIAGNOSIS — I6350 Cerebral infarction due to unspecified occlusion or stenosis of unspecified cerebral artery: Secondary | ICD-10-CM | POA: Diagnosis not present

## 2014-08-05 DIAGNOSIS — Z79899 Other long term (current) drug therapy: Secondary | ICD-10-CM

## 2014-08-05 LAB — BASIC METABOLIC PANEL
BUN: 43 mg/dL — ABNORMAL HIGH (ref 6–23)
CALCIUM: 9.1 mg/dL (ref 8.4–10.5)
CO2: 28 mEq/L (ref 19–32)
Chloride: 103 mEq/L (ref 96–112)
Creatinine, Ser: 4.2 mg/dL — ABNORMAL HIGH (ref 0.4–1.5)
GFR: 17.68 mL/min — AB (ref >60.00)
GLUCOSE: 128 mg/dL — AB (ref 70–99)
Potassium: 3.7 mEq/L (ref 3.5–5.1)
SODIUM: 147 meq/L — AB (ref 135–145)

## 2014-08-05 NOTE — Progress Notes (Signed)
HPI The patient presents for evaluation of heart failure.  Since I last saw him he has done relatively well. He has had to take metolazone about 2 or 3 a week.. This brings his weight down.  He is not reporting any new shortness of breath although he does get dyspneic with moderate exertion. He's not describing any new PND or orthopnea. He is weighing himself on most days but is not writing it down. He denies any chest pressure, neck or arm discomfort. He's not had any palpitations, presyncope or syncope. He has had no new PND or orthopnea. His lower extremity wounds are now healed and he actually has the wrap taken off of them.   No Known Allergies  Current Outpatient Prescriptions  Medication Sig Dispense Refill  . amiodarone (PACERONE) 200 MG tablet TAKE 1 TABLET (200 MG TOTAL) BY MOUTH DAILY.  30 tablet  5  . amLODipine (NORVASC) 10 MG tablet Take 10 mg by mouth daily.      [name]. atorvastatin (LIPITOR) 20 MG tablet Take 20 mg by mouth at bedtime.      . budesonide-formoterol (SYMBICORT) 160-4.5 MCG/ACT inhaler Inhale 2 puffs into the lungs daily as needed (shortness of breath).      . carvedilol (COREG) 6.25 MG tablet TAKE 1 TABLET (6.25 MG TOTAL) BY MOUTH 2 (TWO) TIMES DAILY WITH A MEAL.  60 tablet  5  . cholecalciferol (VITAMIN D) 1000 UNITS tablet Take 1,000 Units by mouth daily.      . collagenase (SANTYL) ointment Apply 1 application topically 2 (two) times a week. On Monday and Friday at home (for wounds on legs) - applied Wednesday at the wound center      . [facility] VITAMIN B12 1000 MCG tablet TAKE 1 TABLET BY MOUTH DAILY  100 tablet  3  . ferrous sulfate 325 (65 FE) MG tablet TAKE 1 TABLET BY MOUTH EVERY DAY  30 tablet  6  . glipiZIDE (GLUCOTROL) 5 MG tablet TAKE 1/2 TABLET BY MOUTH TWICE A DAY BEFORE A MEAL  30 tablet  3  . hydrALAZINE (APRESOLINE) 50 MG tablet Take 50 mg by mouth 3 (three) times daily.      [name]. HYDROcodone-acetaminophen (NORCO/VICODIN) 5-325 MG per tablet Take 1 tablet by  mouth 2 (two) times daily as needed.  60 tablet  0  . isosorbide mononitrate (IMDUR) 60 MG 24 hr tablet TAKE 1 TABLET (60 MG TOTAL) BY MOUTH DAILY.  30 tablet  5  . metolazone (ZAROXOLYN) 5 MG tablet Take 5 mg by mouth daily. As directed      . potassium chloride SA (KLOR-CON M20) 20 MEQ tablet TAKE 3 TABS IN THE A.M. and 4 TABS IN THE AFTERNOON  180 tablet  6  . torsemide (DEMADEX) 20 MG tablet Take 2 tablets (40 mg total) by mouth daily.  120 tablet  11  . triamcinolone cream (KENALOG) 0.5 % Apply 1 application topically 2 (two) times daily as needed (for skin breakdown).  30 g  0  . triamcinolone cream (KENALOG) 0.5 % APPLY TO AFFECTED AREA TWICE DAILY  270 g  1  . warfarin (COUMADIN) 5 MG tablet TAKE 1 TABLET (5 MG TOTAL) BY MOUTH AS DIRECTED.  40 tablet  2   No current facility-administered medications for this visit.    Past Medical History  Diagnosis Date  . Gout   . HTN (hypertension)   . Hyperlipidemia   . Osteoarthritis   . History of CVA (cerebrovascular accident)  Left pontine infarct July 2004; changed from Plavix to Coumadin in 2004 per MD at [facility]  . GERD (gastroesophageal reflux disease)   . DJD (degenerative joint disease)   . BPH (benign prostatic hypertrophy)   . DM (diabetes mellitus), type 2   . Peripheral vascular disease   . Bilateral leg ulcer     ACHILLES AREA--  NONHEALING  . CKD (chronic kidney disease) stage 3, GFR 30-59 ml/min   . CAD (coronary artery disease)     a. LHC 4/12: Mid LAD 25%, mid diagonal 30%, AV circumflex 40%, proximal OM 25%, distal RCA 40%  . Chronic combined systolic and diastolic heart failure     a. Echo 05/2013: EF 25-30%, diffuse HK, restrictive physiology, trivial AI, mild MR, moderate LAE, reduced RV systolic function, PASP 42  . LBBB (left bundle branch block)   . Atrial fibrillation     a. amiodarone Rx started 05/2013;  b. chronic coumadin  . NICM (nonischemic cardiomyopathy)     a. EF 25-30%.    Past Surgical History    Procedure Laterality Date  . Cardiac catheterization  04-16-2011   DR [facility]    NON-OBSTRUCTIVE CAD. MILDLY ELEVATED PULMONARY PRESSURES/ ELEVATED  END-DIASTOLIC PRESSURE  . Transthoracic echocardiogram  12-31-2010    MODERATE CONCENTRIC LVH/ SYSTOLIC FUNCTION SEVERELY REDUCED/ EF 25-30%/  SEVERE HYPOKINESIS OF ANTEROSEPTAL MYOCARDIUM  AND ENTIREAPICAL MYOCARDIUM /  MODERATE HYPOKINESIS OF LATERAL, INFEROLATERAL, INFERIOR,AND INFEROSEPTAL MYOCARIUM/  GRADE 3 DIASTOLIC DYSFUNCTION/ MILD MR  . Aortogram w/ bilateral lower extremitiy runoff  05-18-2013  DR FIELDS    LEFT LEG OCCLUDED PERONEAL AND ANTERIOR TIBIAL ARTERIES/ HIGH GRADE STENOIS 80% MIDDLE AND DISTAL THIRD OF POSTERIOR TIBIAL ARTERY/ RIGHT PERONEAL AND ANTERIOR TIBIAL ARTERY OCCLUDED/ 40% STENOSIS DISTALLY     ROS:  As stated in the HPI and negative for all other systems.  PHYSICAL EXAM BP 132/80  Pulse 54  Ht 6\' 2"  (1.88 m)  Wt 230 lb (104.327 kg)  BMI 29.52 kg/m2 GENERAL:  Well appearing NECK:  6o JVD, waveform within normal limits, carotid upstroke brisk and symmetric, no bruits, no thyromegaly LUNGS:  Clear to auscultation bilaterally BACK:  No CVA tenderness CHEST:  Unremarkable HEART:  PMI not displaced or sustained,S1 and S2 within normal limits, no S3, no S4, no clicks, no rubs, no murmurs ABD:  Flat, positive bowel sounds normal in frequency in pitch, no bruits, no rebound, no guarding, no midline pulsatile mass, no hepatomegaly, no splenomegaly EXT:  2 plus pulses throughout, left greater than right mild edema, no cyanosis no clubbing.    EKG:  Sinus rhythm, rate 54, left bundle branch block, left axis deviation, no change from previous. 08/05/2014  ASSESSMENT AND PLAN  CONGESTIVE HEART FAILURE -  He will continue on the meds as listed. I will check a basic metabolic profile today as he had a low potassium previously but did not have his followup labs as suggested. I emphasized the importance of daily weights.   His heart rate will not allow med titration. He does have Class II symptoms and we will consider a defibrillator when I see him back.  HYPERTENSION -  The blood pressure is at target. No change in medications is indicated. We will continue with therapeutic lifestyle changes (TLC).  PVD - Healed.    ATRIAL FIBRILLATION:   The patient seems to be maintaining sinus rhythm.  He is up to date with lab work.    Lab Results  Component Value Date  TSH 3.31 04/01/2014   ALT 24 06/03/2014   AST 20 06/03/2014   ALKPHOS 96 06/03/2014   BILITOT 0.6 06/03/2014   PROT 6.8 06/03/2014   ALBUMIN 4.2 06/03/2014

## 2014-08-05 NOTE — Patient Instructions (Signed)
Your physician recommends that you schedule a follow-up appointment in: 4 months with dr. Antoine Poche  Have your bloodwork done as soon as you can

## 2014-08-06 ENCOUNTER — Other Ambulatory Visit: Payer: Self-pay | Admitting: Physician Assistant

## 2014-08-07 ENCOUNTER — Telehealth: Payer: Self-pay | Admitting: Cardiology

## 2014-08-07 NOTE — Telephone Encounter (Signed)
New Prob   Wife has some questions regarding pts current medications. Please call.

## 2014-08-07 NOTE — Telephone Encounter (Signed)
Ms. Frank Mclean states she did not understand message left on voicemail. Informed to hold Zaroxolyn for the next week , decrease Torsemide to 20 mg daily recheck bmp in 5 days . She verbalized understanding. She states she does not give patient Zaroxolyn unless instructed by the office. She aware it continue to do current 20 mg torsemide until she gets a response from upcoming lab results

## 2014-08-08 ENCOUNTER — Other Ambulatory Visit: Payer: Self-pay | Admitting: *Deleted

## 2014-08-08 DIAGNOSIS — I509 Heart failure, unspecified: Secondary | ICD-10-CM | POA: Diagnosis not present

## 2014-08-08 DIAGNOSIS — N183 Chronic kidney disease, stage 3 unspecified: Secondary | ICD-10-CM

## 2014-08-08 DIAGNOSIS — E119 Type 2 diabetes mellitus without complications: Secondary | ICD-10-CM | POA: Diagnosis not present

## 2014-08-08 DIAGNOSIS — I872 Venous insufficiency (chronic) (peripheral): Secondary | ICD-10-CM | POA: Diagnosis not present

## 2014-08-08 DIAGNOSIS — I10 Essential (primary) hypertension: Secondary | ICD-10-CM | POA: Diagnosis not present

## 2014-08-08 NOTE — Telephone Encounter (Signed)
I have called and talked to his wife and explained in detail what she needed to do and she verbalized understanding

## 2014-08-12 ENCOUNTER — Other Ambulatory Visit (INDEPENDENT_AMBULATORY_CARE_PROVIDER_SITE_OTHER): Payer: Medicare Other

## 2014-08-12 ENCOUNTER — Telehealth: Payer: Self-pay | Admitting: Cardiology

## 2014-08-12 ENCOUNTER — Other Ambulatory Visit: Payer: Self-pay | Admitting: *Deleted

## 2014-08-12 DIAGNOSIS — I509 Heart failure, unspecified: Secondary | ICD-10-CM | POA: Diagnosis not present

## 2014-08-12 DIAGNOSIS — N183 Chronic kidney disease, stage 3 unspecified: Secondary | ICD-10-CM

## 2014-08-12 DIAGNOSIS — R0902 Hypoxemia: Secondary | ICD-10-CM

## 2014-08-12 DIAGNOSIS — J189 Pneumonia, unspecified organism: Secondary | ICD-10-CM

## 2014-08-12 DIAGNOSIS — I10 Essential (primary) hypertension: Secondary | ICD-10-CM

## 2014-08-12 DIAGNOSIS — I5022 Chronic systolic (congestive) heart failure: Secondary | ICD-10-CM

## 2014-08-12 DIAGNOSIS — E538 Deficiency of other specified B group vitamins: Secondary | ICD-10-CM | POA: Diagnosis not present

## 2014-08-12 DIAGNOSIS — E1129 Type 2 diabetes mellitus with other diabetic kidney complication: Secondary | ICD-10-CM | POA: Diagnosis not present

## 2014-08-12 DIAGNOSIS — I4891 Unspecified atrial fibrillation: Secondary | ICD-10-CM | POA: Diagnosis not present

## 2014-08-12 DIAGNOSIS — E1122 Type 2 diabetes mellitus with diabetic chronic kidney disease: Secondary | ICD-10-CM

## 2014-08-12 DIAGNOSIS — E876 Hypokalemia: Secondary | ICD-10-CM

## 2014-08-12 LAB — CBC WITH DIFFERENTIAL/PLATELET
BASOS PCT: 0.5 % (ref 0.0–3.0)
Basophils Absolute: 0 10*3/uL (ref 0.0–0.1)
Eosinophils Absolute: 0.1 10*3/uL (ref 0.0–0.7)
Eosinophils Relative: 1.5 % (ref 0.0–5.0)
HCT: 38.2 % — ABNORMAL LOW (ref 39.0–52.0)
Hemoglobin: 12.5 g/dL — ABNORMAL LOW (ref 13.0–17.0)
LYMPHS PCT: 13.7 % (ref 12.0–46.0)
Lymphs Abs: 1 10*3/uL (ref 0.7–4.0)
MCHC: 32.8 g/dL (ref 30.0–36.0)
MCV: 84.6 fl (ref 78.0–100.0)
MONOS PCT: 3.6 % (ref 3.0–12.0)
Monocytes Absolute: 0.3 10*3/uL (ref 0.1–1.0)
NEUTROS PCT: 80.7 % — AB (ref 43.0–77.0)
Neutro Abs: 5.8 10*3/uL (ref 1.4–7.7)
Platelets: 170 10*3/uL (ref 150.0–400.0)
RBC: 4.52 Mil/uL (ref 4.22–5.81)
RDW: 18.1 % — ABNORMAL HIGH (ref 11.5–15.5)
WBC: 7.2 10*3/uL (ref 4.0–10.5)

## 2014-08-12 LAB — BASIC METABOLIC PANEL
BUN: 22 mg/dL (ref 6–23)
CALCIUM: 9 mg/dL (ref 8.4–10.5)
CHLORIDE: 107 meq/L (ref 96–112)
CO2: 26 mEq/L (ref 19–32)
Creatinine, Ser: 2.6 mg/dL — ABNORMAL HIGH (ref 0.4–1.5)
GFR: 31.79 mL/min — ABNORMAL LOW (ref >60.00)
Glucose, Bld: 119 mg/dL — ABNORMAL HIGH (ref 70–99)
Potassium: 4.5 mEq/L (ref 3.5–5.1)
Sodium: 144 mEq/L (ref 135–145)

## 2014-08-12 LAB — HEPATIC FUNCTION PANEL
ALK PHOS: 78 U/L (ref 39–117)
ALT: 21 U/L (ref 0–53)
AST: 27 U/L (ref 0–37)
Albumin: 3.8 g/dL (ref 3.5–5.2)
BILIRUBIN DIRECT: 0.1 mg/dL (ref 0.0–0.3)
TOTAL PROTEIN: 6.5 g/dL (ref 6.0–8.3)
Total Bilirubin: 0.6 mg/dL (ref 0.2–1.2)

## 2014-08-12 MED ORDER — METOLAZONE 5 MG PO TABS: 5.0000 mg | ORAL_TABLET | Freq: Every day | ORAL | Status: DC

## 2014-08-12 NOTE — Telephone Encounter (Signed)
Called and resolved issue, bmp sent to [facility] [address], also Zarxolyn refill sent in

## 2014-08-12 NOTE — Telephone Encounter (Signed)
New Message  Pt called states that his water pills were taken. at the end of five days he was suppoosed to have a lab/blood work.. Pt is now up 8lbs and reports they cannot wait until 08/19/2014. Please call back to discuss.

## 2014-08-14 ENCOUNTER — Telehealth: Payer: Self-pay | Admitting: Cardiology

## 2014-08-14 NOTE — Telephone Encounter (Signed)
Pt. Informed about his lab results

## 2014-08-14 NOTE — Telephone Encounter (Signed)
New Message   Pt wife called for lab results.. Please call

## 2014-08-19 ENCOUNTER — Telehealth: Payer: Self-pay | Admitting: Physician Assistant

## 2014-08-19 ENCOUNTER — Other Ambulatory Visit: Payer: Self-pay | Admitting: Internal Medicine

## 2014-08-19 NOTE — Telephone Encounter (Signed)
Ms. Woodring called because her husband had gained 12 lbs since his torsemide was decreased from 20 mg BID to 20 mg once daily (due to kidney problems). There have been no dietary indiscretions, and he is compliant with his medications. She was concerned and noticing edema in his face.  Advised her to give him 20 mg torsemide tonight. She has metolazone 5 mg, which he has not been getting, she will give him that in am, 30 minutes before his torsemide. Advised her the office will call her with an appointment and lab work tomorrow. If he gets worse, bring him to the ER.

## 2014-08-21 ENCOUNTER — Other Ambulatory Visit: Payer: Self-pay | Admitting: Physician Assistant

## 2014-08-21 DIAGNOSIS — E876 Hypokalemia: Secondary | ICD-10-CM

## 2014-08-29 ENCOUNTER — Other Ambulatory Visit: Payer: Self-pay | Admitting: Physician Assistant

## 2014-08-29 DIAGNOSIS — E876 Hypokalemia: Secondary | ICD-10-CM

## 2014-09-06 ENCOUNTER — Ambulatory Visit: Payer: Medicare Other | Admitting: Cardiology

## 2014-09-12 ENCOUNTER — Telehealth: Payer: Self-pay | Admitting: *Deleted

## 2014-09-12 ENCOUNTER — Ambulatory Visit (INDEPENDENT_AMBULATORY_CARE_PROVIDER_SITE_OTHER): Payer: Medicare Other | Admitting: Physician Assistant

## 2014-09-12 ENCOUNTER — Encounter: Payer: Self-pay | Admitting: Physician Assistant

## 2014-09-12 VITALS — BP 150/87 | HR 49 | Ht 74.0 in | Wt 244.0 lb

## 2014-09-12 DIAGNOSIS — I480 Paroxysmal atrial fibrillation: Secondary | ICD-10-CM

## 2014-09-12 DIAGNOSIS — I4891 Unspecified atrial fibrillation: Secondary | ICD-10-CM | POA: Diagnosis not present

## 2014-09-12 DIAGNOSIS — R0602 Shortness of breath: Secondary | ICD-10-CM | POA: Diagnosis not present

## 2014-09-12 DIAGNOSIS — E785 Hyperlipidemia, unspecified: Secondary | ICD-10-CM

## 2014-09-12 DIAGNOSIS — I428 Other cardiomyopathies: Secondary | ICD-10-CM

## 2014-09-12 DIAGNOSIS — N183 Chronic kidney disease, stage 3 unspecified: Secondary | ICD-10-CM | POA: Diagnosis not present

## 2014-09-12 DIAGNOSIS — S81009A Unspecified open wound, unspecified knee, initial encounter: Secondary | ICD-10-CM

## 2014-09-12 DIAGNOSIS — I10 Essential (primary) hypertension: Secondary | ICD-10-CM

## 2014-09-12 DIAGNOSIS — S81809A Unspecified open wound, unspecified lower leg, initial encounter: Secondary | ICD-10-CM

## 2014-09-12 DIAGNOSIS — I5043 Acute on chronic combined systolic (congestive) and diastolic (congestive) heart failure: Secondary | ICD-10-CM | POA: Diagnosis not present

## 2014-09-12 DIAGNOSIS — S81809D Unspecified open wound, unspecified lower leg, subsequent encounter: Secondary | ICD-10-CM

## 2014-09-12 DIAGNOSIS — S91009A Unspecified open wound, unspecified ankle, initial encounter: Secondary | ICD-10-CM

## 2014-09-12 DIAGNOSIS — I5042 Chronic combined systolic (congestive) and diastolic (congestive) heart failure: Secondary | ICD-10-CM | POA: Diagnosis not present

## 2014-09-12 DIAGNOSIS — I6350 Cerebral infarction due to unspecified occlusion or stenosis of unspecified cerebral artery: Secondary | ICD-10-CM | POA: Diagnosis not present

## 2014-09-12 DIAGNOSIS — I2510 Atherosclerotic heart disease of native coronary artery without angina pectoris: Secondary | ICD-10-CM | POA: Diagnosis not present

## 2014-09-12 DIAGNOSIS — Z5189 Encounter for other specified aftercare: Secondary | ICD-10-CM

## 2014-09-12 DIAGNOSIS — I509 Heart failure, unspecified: Secondary | ICD-10-CM

## 2014-09-12 LAB — CBC WITH DIFFERENTIAL/PLATELET
BASOS PCT: 0.3 % (ref 0.0–3.0)
Basophils Absolute: 0 10*3/uL (ref 0.0–0.1)
EOS ABS: 0.1 10*3/uL (ref 0.0–0.7)
EOS PCT: 1.3 % (ref 0.0–5.0)
HEMATOCRIT: 38.4 % — AB (ref 39.0–52.0)
Hemoglobin: 12.4 g/dL — ABNORMAL LOW (ref 13.0–17.0)
Lymphocytes Relative: 12.6 % (ref 12.0–46.0)
Lymphs Abs: 0.9 10*3/uL (ref 0.7–4.0)
MCHC: 32.2 g/dL (ref 30.0–36.0)
MCV: 87.6 fl (ref 78.0–100.0)
MONO ABS: 0.3 10*3/uL (ref 0.1–1.0)
Monocytes Relative: 3.8 % (ref 3.0–12.0)
NEUTROS PCT: 82 % — AB (ref 43.0–77.0)
Neutro Abs: 6.1 10*3/uL (ref 1.4–7.7)
Platelets: 187 10*3/uL (ref 150.0–400.0)
RBC: 4.39 Mil/uL (ref 4.22–5.81)
RDW: 17.9 % — ABNORMAL HIGH (ref 11.5–15.5)
WBC: 7.5 10*3/uL (ref 4.0–10.5)

## 2014-09-12 LAB — BASIC METABOLIC PANEL
BUN: 28 mg/dL — ABNORMAL HIGH (ref 6–23)
CHLORIDE: 104 meq/L (ref 96–112)
CO2: 32 meq/L (ref 19–32)
CREATININE: 3.4 mg/dL — AB (ref 0.4–1.5)
Calcium: 9.2 mg/dL (ref 8.4–10.5)
GFR: 22.8 mL/min — ABNORMAL LOW (ref >60.00)
Glucose, Bld: 131 mg/dL — ABNORMAL HIGH (ref 70–99)
POTASSIUM: 3.8 meq/L (ref 3.5–5.1)
Sodium: 144 mEq/L (ref 135–145)

## 2014-09-12 LAB — BRAIN NATRIURETIC PEPTIDE: Pro B Natriuretic peptide (BNP): 100 pg/mL (ref 0.0–100.0)

## 2014-09-12 MED ORDER — AMIODARONE HCL 200 MG PO TABS: 200.0000 mg | ORAL_TABLET | ORAL | Status: DC

## 2014-09-12 MED ORDER — POTASSIUM CHLORIDE CRYS ER 20 MEQ PO TBCR: EXTENDED_RELEASE_TABLET | ORAL | Status: DC

## 2014-09-12 MED ORDER — TORSEMIDE 20 MG PO TABS: 20.0000 mg | ORAL_TABLET | Freq: Two times a day (BID) | ORAL | Status: DC

## 2014-09-12 NOTE — Patient Instructions (Addendum)
Your physician has recommended you make the following change in your medication:  1. INCREASE TORSEMIDE TO 20 MG TWICE DAILY 2. INCREASE POTASSIUM TO 80 MEQ TWICE DAILY .. (4 TABS TWICE DAILY) 3. TOMORROW ONLY TAKE METOLAZONE 5 MG 30 MINUTES BEFORE MORNING TORSEMIDE 4. DECREASE AMIODARONE TO 200 MG MON-FRI AND ON Saturday AND Sunday'S TAKE 100 MG (1/2 TAB ON SAT AND SUN)  LAB WORK TODAY; BMET, BNP, CBC W/DIFF  YOU WILL NEED TO HAVE A REPEAT BMET IN 1 WEEK SAME DAY YOU SEE SCOTT WEAVER  Your physician recommends that you schedule a follow-up appointment ON 09/17/14 WITH SCOTT WEAVER, PAC AT 9:30

## 2014-09-12 NOTE — Progress Notes (Signed)
Cardiology Office Note    Date:  09/12/2014   ID:  Ahamad, Summar Jan 19, 1941, MRN [id number]  PCP:  [name], MD  Cardiologist:  Dr. Rollene Rotunda     History of Present Illness: Frank Mclean is a 73 y.o. male with a hx of chronic combined systolic and diastolic CHF, NICM (EF 25-30%), nonobstructive CAD (cath 2012), AFib, CAD, HTN, HL, PAD, LE venous ulcers (followed by wound clinic).  Admitted 05/2013 for AFib with RVR complicated by type II non-STEMI and a/c combined systolic and diastolic CHF. He was placed on amiodarone at that time with restoration of NSR.   Admitted 08/2013 for a/c renal failure.   He was felt to be dehydrated and he was gently hydrated with IVFs ("warm and dry") and supported with low dose dopamine.       Admitted 11/2013 with acute respiratory failure secondary to community acquired pneumonia complicated by decompensated heart failure.   Last seen by Dr. Rollene Rotunda 8/15.  He has had some issues with increased creatinine (peak 4.2 >> 2.6) and low K+ (2.8 >> 4.5) in the setting of increased diuretics (taken for volume excess).  He called in recently with 12 lb weight gain.  He returns for evaluation.  His wife brings in a list of his weights the last couple of months. He has had a significant weight gain without significant improvement despite adjusting diuretics. His dyspnea has worsened. He is NYHA 2b-3. He sleeps on 2 pillows chronically. Denies PND. Denies significant LE edema. He does note increased abdominal girth. Denies chest pain or syncope.   Recent Labs: 04/01/2014: Pro B Natriuretic peptide (BNP) 138.0*; TSH 3.31  06/24/2014: LDL (calc) 186*  08/12/2014: ALT 21; Creatinine 2.6*; Hemoglobin 12.5*; Potassium 4.5    Wt Readings from Last 3 Encounters:  08/05/14 230 lb (104.327 kg)  06/26/14 225 lb (102.059 kg)  06/03/14 226 lb (102.513 kg)     Past Medical History  Diagnosis Date  . Gout   . HTN (hypertension)   .  Hyperlipidemia   . Osteoarthritis   . History of CVA (cerebrovascular accident)     Left pontine infarct July 2004; changed from Plavix to Coumadin in 2004 per MD at [facility]  . GERD (gastroesophageal reflux disease)   . DJD (degenerative joint disease)   . BPH (benign prostatic hypertrophy)   . DM (diabetes mellitus), type 2   . Peripheral vascular disease   . Bilateral leg ulcer     ACHILLES AREA--  NONHEALING  . CKD (chronic kidney disease) stage 3, GFR 30-59 ml/min   . CAD (coronary artery disease)     a. LHC 4/12: Mid LAD 25%, mid diagonal 30%, AV circumflex 40%, proximal OM 25%, distal RCA 40%  . Chronic combined systolic and diastolic heart failure     a. Echo 05/2013: EF 25-30%, diffuse HK, restrictive physiology, trivial AI, mild MR, moderate LAE, reduced RV systolic function, PASP 42  . LBBB (left bundle branch block)   . Atrial fibrillation     a. amiodarone Rx started 05/2013;  b. chronic coumadin  . NICM (nonischemic cardiomyopathy)     a. EF 25-30%.    Current Outpatient Prescriptions  Medication Sig Dispense Refill  . amiodarone (PACERONE) 200 MG tablet TAKE 1 TABLET (200 MG TOTAL) BY MOUTH DAILY.  30 tablet  5  . amLODipine (NORVASC) 10 MG tablet Take 10 mg by mouth daily.      [name] atorvastatin (LIPITOR) 20 MG tablet  Take 20 mg by mouth at bedtime.      . budesonide-formoterol (SYMBICORT) 160-4.5 MCG/ACT inhaler Inhale 2 puffs into the lungs daily as needed (shortness of breath).      . carvedilol (COREG) 6.25 MG tablet TAKE 1 TABLET (6.25 MG TOTAL) BY MOUTH 2 (TWO) TIMES DAILY WITH A MEAL.  60 tablet  5  . cholecalciferol (VITAMIN D) 1000 UNITS tablet Take 1,000 Units by mouth daily.      . collagenase (SANTYL) ointment Apply 1 application topically 2 (two) times a week. On Monday and Friday at home (for wounds on legs) - applied Wednesday at the wound center      . [facility]S VITAMIN B12 1000 MCG tablet TAKE 1 TABLET BY MOUTH DAILY  100 tablet  3  . ferrous sulfate 325 (65 FE)  MG tablet TAKE 1 TABLET BY MOUTH EVERY DAY  30 tablet  6  . glipiZIDE (GLUCOTROL) 5 MG tablet TAKE 1/2 TABLET BY MOUTH TWICE A DAY BEFORE A MEAL  30 tablet  3  . hydrALAZINE (APRESOLINE) 50 MG tablet Take 50 mg by mouth 3 (three) times daily.      . hydrALAZINE (APRESOLINE) 50 MG tablet TAKE 1 TABLET (50 MG TOTAL) BY MOUTH 3 (THREE) TIMES DAILY.  90 tablet  3  . HYDROcodone-acetaminophen (NORCO/VICODIN) 5-325 MG per tablet Take 1 tablet by mouth 2 (two) times daily as needed.  60 tablet  0  . isosorbide mononitrate (IMDUR) 60 MG 24 hr tablet TAKE 1 TABLET (60 MG TOTAL) BY MOUTH DAILY.  30 tablet  5  . metolazone (ZAROXOLYN) 5 MG tablet Take 1 tablet (5 mg total) by mouth daily.  30 tablet  6  . potassium chloride SA (KLOR-CON M20) 20 MEQ tablet TAKE 3 TABS IN THE A.M. and 4 TABS IN THE AFTERNOON  180 tablet  6  . torsemide (DEMADEX) 20 MG tablet Take 2 tablets (40 mg total) by mouth daily.  120 tablet  11  . triamcinolone cream (KENALOG) 0.5 % Apply 1 application topically 2 (two) times daily as needed (for skin breakdown).  30 g  0  . triamcinolone cream (KENALOG) 0.5 % APPLY TO AFFECTED AREA TWICE DAILY  270 g  1  . warfarin (COUMADIN) 5 MG tablet TAKE 1 TABLET (5 MG TOTAL) BY MOUTH AS DIRECTED.  40 tablet  2   No current facility-administered medications for this visit.    Allergies:   No Known Allergies  Social History:  The patient  reports that he quit smoking about 40 years ago. He has never used smokeless tobacco. He reports that he does not drink alcohol or use illicit drugs.    Family History:  [name] family history includes Diabetes in his mother; Gout in his other; Heart disease in his father and mother; Hypertension in his father and mother; Stroke in his other.   ROS:  Please see the history of present illness.  He notes low back pain worse with standing.   All other systems reviewed and negative.   PHYSICAL EXAM: VS:  BP 150/87  Pulse 49  Ht  (1.88 m)  Wt 244  lb (110.678 kg)  BMI 31.31 kg/m2 Well nourished, well developed, in no acute distress HEENT: normal Neck: minimally elevated JVD   Cardiac:  normal S1, S2; RRR; no murmur; no rubno S3 Lungs:  Decreased breath sounds bilaterally, no wheezing, rhonchi or rales Abd: distended Ext: no bilateral LE edema  Skin: warm and dry Neuro:  CNs 2-12 intact, face is symmetrical  EKG:   NSR, HR 49, LBBB   ASSESSMENT AND PLAN:  1. Acute on Chronic Combined Systolic and Diastolic CHF:   He is volume overloaded. I will increase his torsemide to 20 mg twice a day. I will give him one dose of metolazone tomorrow. Increase potassium to 4 tablets twice a day. BMET and BNP today. Repeat a basic metabolic panel early next week. If his creatinine is worsened, I may need to hold off on giving him metolazone for now. Return for early followup 2. Non-Ischemic CM:   Consider candidacy for ICD at FU with Dr. Rollene Rotunda since leg wounds are completely healed.  Continue current dose of beta blocker.  He is not on ACEI due to CKD.  Continue hydralazine and nitrates.   3. CAD:   No angina. He is not on aspirin as he is on Coumadin. Continue statin. 4. Atrial Fibrillation:   Maintaining NSR.  He remains on coumadin and Amiodarone.  Recent TSH and LFTs ok.   5. Chronic Kidney Disease:   Keep a close eye on renal function and potassium with adjustments in diuretics.   6. Hypertension:   Fair control..  7. Hyperlipidemia:   Continue statin. 8. Lower Extremity Ulcers:  These have healed.   Disposition: FU with me in one week and Dr. Rollene Rotunda [address] [place], [facility] 09/12/2014 8:43 AM    [facility] [address] [place], [place], [place]  [address] Phone: [phone]; Fax: [phone]

## 2014-09-12 NOTE — Telephone Encounter (Signed)
pt's wife notified about lab results and NOT to give pt metolazone tomorrow. Keep appt Monday 9/28 9:30 w/SW, bmet same day.Marland KitchenMarland KitchenWife verbalized understanding to Plan of care

## 2014-09-16 ENCOUNTER — Encounter: Payer: Self-pay | Admitting: Physician Assistant

## 2014-09-16 ENCOUNTER — Other Ambulatory Visit (INDEPENDENT_AMBULATORY_CARE_PROVIDER_SITE_OTHER): Payer: Medicare Other | Admitting: *Deleted

## 2014-09-16 ENCOUNTER — Ambulatory Visit (INDEPENDENT_AMBULATORY_CARE_PROVIDER_SITE_OTHER): Payer: Medicare Other | Admitting: Surgery

## 2014-09-16 ENCOUNTER — Ambulatory Visit (INDEPENDENT_AMBULATORY_CARE_PROVIDER_SITE_OTHER): Payer: Medicare Other | Admitting: Physician Assistant

## 2014-09-16 VITALS — BP 150/100 | HR 52 | Ht 74.0 in | Wt 240.0 lb

## 2014-09-16 DIAGNOSIS — I480 Paroxysmal atrial fibrillation: Secondary | ICD-10-CM

## 2014-09-16 DIAGNOSIS — S81009A Unspecified open wound, unspecified knee, initial encounter: Secondary | ICD-10-CM

## 2014-09-16 DIAGNOSIS — I428 Other cardiomyopathies: Secondary | ICD-10-CM

## 2014-09-16 DIAGNOSIS — R3911 Hesitancy of micturition: Secondary | ICD-10-CM

## 2014-09-16 DIAGNOSIS — I10 Essential (primary) hypertension: Secondary | ICD-10-CM

## 2014-09-16 DIAGNOSIS — Z79899 Other long term (current) drug therapy: Secondary | ICD-10-CM | POA: Diagnosis not present

## 2014-09-16 DIAGNOSIS — I4891 Unspecified atrial fibrillation: Secondary | ICD-10-CM

## 2014-09-16 DIAGNOSIS — S81809D Unspecified open wound, unspecified lower leg, subsequent encounter: Secondary | ICD-10-CM

## 2014-09-16 DIAGNOSIS — I6350 Cerebral infarction due to unspecified occlusion or stenosis of unspecified cerebral artery: Secondary | ICD-10-CM

## 2014-09-16 DIAGNOSIS — I2510 Atherosclerotic heart disease of native coronary artery without angina pectoris: Secondary | ICD-10-CM | POA: Diagnosis not present

## 2014-09-16 DIAGNOSIS — E785 Hyperlipidemia, unspecified: Secondary | ICD-10-CM

## 2014-09-16 DIAGNOSIS — I5042 Chronic combined systolic (congestive) and diastolic (congestive) heart failure: Secondary | ICD-10-CM

## 2014-09-16 DIAGNOSIS — R0602 Shortness of breath: Secondary | ICD-10-CM

## 2014-09-16 DIAGNOSIS — Z5189 Encounter for other specified aftercare: Secondary | ICD-10-CM

## 2014-09-16 DIAGNOSIS — N183 Chronic kidney disease, stage 3 unspecified: Secondary | ICD-10-CM

## 2014-09-16 DIAGNOSIS — I447 Left bundle-branch block, unspecified: Secondary | ICD-10-CM

## 2014-09-16 DIAGNOSIS — Z7901 Long term (current) use of anticoagulants: Secondary | ICD-10-CM

## 2014-09-16 DIAGNOSIS — S91009A Unspecified open wound, unspecified ankle, initial encounter: Secondary | ICD-10-CM

## 2014-09-16 DIAGNOSIS — S81809A Unspecified open wound, unspecified lower leg, initial encounter: Secondary | ICD-10-CM

## 2014-09-16 DIAGNOSIS — N189 Chronic kidney disease, unspecified: Secondary | ICD-10-CM | POA: Diagnosis not present

## 2014-09-16 DIAGNOSIS — Z5181 Encounter for therapeutic drug level monitoring: Secondary | ICD-10-CM

## 2014-09-16 LAB — URINALYSIS WITH CULTURE, IF INDICATED
Bilirubin Urine: NEGATIVE
Ketones, ur: NEGATIVE
LEUKOCYTES UA: NEGATIVE
Nitrite: NEGATIVE
PH: 6.5 (ref 5.0–8.0)
SPECIFIC GRAVITY, URINE: 1.01 (ref 1.000–1.030)
Total Protein, Urine: 30 — AB
Urine Glucose: NEGATIVE
Urobilinogen, UA: 0.2 (ref 0.0–1.0)

## 2014-09-16 LAB — BASIC METABOLIC PANEL
BUN: 28 mg/dL — ABNORMAL HIGH (ref 6–23)
CHLORIDE: 104 meq/L (ref 96–112)
CO2: 32 meq/L (ref 19–32)
CREATININE: 3.2 mg/dL — AB (ref 0.4–1.5)
Calcium: 9.2 mg/dL (ref 8.4–10.5)
GFR: 24.63 mL/min — ABNORMAL LOW (ref >60.00)
GLUCOSE: 111 mg/dL — AB (ref 70–99)
Potassium: 3.8 mEq/L (ref 3.5–5.1)
Sodium: 146 mEq/L — ABNORMAL HIGH (ref 135–145)

## 2014-09-16 LAB — BRAIN NATRIURETIC PEPTIDE: PRO B NATRI PEPTIDE: 74 pg/mL (ref 0.0–100.0)

## 2014-09-16 LAB — TSH: TSH: 82 u[IU]/mL — ABNORMAL HIGH (ref 0.35–4.50)

## 2014-09-16 LAB — POCT INR: INR: 2.4

## 2014-09-16 MED ORDER — HYDRALAZINE HCL 50 MG PO TABS: ORAL_TABLET | ORAL | Status: DC

## 2014-09-16 NOTE — Progress Notes (Signed)
Cardiology Office Note    Date:  09/16/2014   ID:  Frank Mclean 12/23/1940, MRN [id number]  PCP:  [name], MD  Cardiologist:  Dr. Rollene Rotunda     History of Present Illness: Frank Mclean is a 73 y.o. male with a hx of chronic combined systolic and diastolic CHF, NICM (EF 25-30%), nonobstructive CAD (cath 2012), AFib, CAD, HTN, HL, PAD, LE venous ulcers (followed by wound clinic).  Admitted 05/2013 for AFib with RVR complicated by type II non-STEMI and a/c combined systolic and diastolic CHF. He was placed on amiodarone at that time with restoration of NSR.   Admitted 08/2013 for a/c renal failure.   He was felt to be dehydrated and he was gently hydrated with IVFs ("warm and dry") and supported with low dose dopamine.       Admitted 11/2013 with acute respiratory failure secondary to community acquired pneumonia complicated by decompensated heart failure.   Last seen by Dr. Rollene Rotunda 8/15.  He has had some issues with increased creatinine (peak 4.2 >> 2.6) and low K+ (2.8 >> 4.5) in the setting of increased diuretics (taken for volume excess).  He called in recently with 12 lb weight gain.  I saw him 9/24.  Creatinine remained elevated.  BNP was only 100.  I had originally had him scheduled to take one dose of Metolazone and increase his Torsemide to bid.  With the results on his labs, I had him increase the Torsemide only.  He returns for close FU. He is here with his brother-in-law today.  There has been minimal change in his symptoms.  Still notes dyspnea with bending over.  Overall he is NYHA 2b-3.  He sleeps on 2 pillows chronically.  No PND.  No significant LE edema.  His brother-in-law noted some "puffiness" around his eyes this weekend.  Weight is down 4 lbs.  He denies chest pain or syncope.  He does note decreased energy.  There is a question of depression.     Recent Labs: 04/01/2014: TSH 3.31  06/24/2014: LDL (calc) 186*  08/12/2014: ALT 21  09/12/2014:  Creatinine 3.4*; Hemoglobin 12.4*; Potassium 3.8; Pro B Natriuretic peptide (BNP) 100.0    Wt Readings from Last 3 Encounters:  09/16/14 240 lb (108.863 kg)  09/12/14 244 lb (110.678 kg)  08/05/14 230 lb (104.327 kg)     Past Medical History  Diagnosis Date  . Gout   . HTN (hypertension)   . Hyperlipidemia   . Osteoarthritis   . History of CVA (cerebrovascular accident)     Left pontine infarct July 2004; changed from Plavix to Coumadin in 2004 per MD at [facility]  . GERD (gastroesophageal reflux disease)   . DJD (degenerative joint disease)   . BPH (benign prostatic hypertrophy)   . DM (diabetes mellitus), type 2   . Peripheral vascular disease   . Bilateral leg ulcer     ACHILLES AREA--  NONHEALING  . CKD (chronic kidney disease) stage 3, GFR 30-59 ml/min   . CAD (coronary artery disease)     a. LHC 4/12: Mid LAD 25%, mid diagonal 30%, AV circumflex 40%, proximal OM 25%, distal RCA 40%  . Chronic combined systolic and diastolic heart failure     a. Echo 05/2013: EF 25-30%, diffuse HK, restrictive physiology, trivial AI, mild MR, moderate LAE, reduced RV systolic function, PASP 42  . LBBB (left bundle branch block)   . Atrial fibrillation     a. amiodarone Rx  started 05/2013;  b. chronic coumadin  . NICM (nonischemic cardiomyopathy)     a. EF 25-30%.    Current Outpatient Prescriptions  Medication Sig Dispense Refill  . amiodarone (PACERONE) 200 MG tablet Take 1 tablet (200 mg total) by mouth as directed. 200 MG M-F.[name] 100 MG ON Saturday AND SUNDAY'S      . amLODipine (NORVASC) 10 MG tablet Take 10 mg by mouth daily.      [name] atorvastatin (LIPITOR) 20 MG tablet Take 20 mg by mouth at bedtime.      . budesonide-formoterol (SYMBICORT) 160-4.5 MCG/ACT inhaler Inhale 2 puffs into the lungs daily as needed (shortness of breath).      . carvedilol (COREG) 6.25 MG tablet TAKE 1 TABLET (6.25 MG TOTAL) BY MOUTH 2 (TWO) TIMES DAILY WITH A MEAL.  60 tablet  5  . cholecalciferol (VITAMIN D)  1000 UNITS tablet Take 1,000 Units by mouth daily.      . CVS VITAMIN B12 1000 MCG tablet TAKE 1 TABLET BY MOUTH DAILY  100 tablet  3  . ferrous sulfate 325 (65 FE) MG tablet TAKE 1 TABLET BY MOUTH EVERY DAY  30 tablet  6  . glipiZIDE (GLUCOTROL) 5 MG tablet TAKE 1/2 TABLET BY MOUTH TWICE A DAY BEFORE A MEAL  30 tablet  3  . hydrALAZINE (APRESOLINE) 50 MG tablet TAKE 1 TABLET (50 MG TOTAL) BY MOUTH 3 (THREE) TIMES DAILY.  90 tablet  3  . HYDROcodone-acetaminophen (NORCO/VICODIN) 5-325 MG per tablet Take 1 tablet by mouth 2 (two) times daily as needed.  60 tablet  0  . isosorbide mononitrate (IMDUR) 60 MG 24 hr tablet TAKE 1 TABLET (60 MG TOTAL) BY MOUTH DAILY.  30 tablet  5  . metolazone (ZAROXOLYN) 5 MG tablet Take 5 mg by mouth as needed.      . potassium chloride SA (KLOR-CON M20) 20 MEQ tablet Take 4 tabs twice daily      . torsemide (DEMADEX) 20 MG tablet Take 1 tablet (20 mg total) by mouth 2 (two) times daily.  180 tablet  3  . triamcinolone cream (KENALOG) 0.5 % Apply 1 application topically 2 (two) times daily as needed (for skin breakdown).  30 g  0  . warfarin (COUMADIN) 5 MG tablet TAKE 1 TABLET (5 MG TOTAL) BY MOUTH AS DIRECTED.  40 tablet  2   No current facility-administered medications for this visit.    Allergies:   No Known Allergies  Social History:  The patient  reports that he quit smoking about 40 years ago. He has never used smokeless tobacco. He reports that he does not drink alcohol or use illicit drugs.    Family History:  [name] family history includes Diabetes in his mother; Gout in his other; Heart attack in his maternal grandmother; Heart disease in his father and mother; Hypertension in his father and mother; Stroke in his other.   ROS:  Please see the history of present illness.  He does note recent urinary hesitancy.  No dysuria.    All other systems reviewed and negative.   PHYSICAL EXAM: VS:  BP 150/100  Pulse 52  Ht 6\' 2"  (1.88 m)  Wt 240 lb  (108.863 kg)  BMI 30.80 kg/m2  SpO2 99% Well nourished, well developed, in no acute distress HEENT: normal Neck: no JVD   Cardiac:  normal S1, S2; RRR; no murmur; no rubno S3 Lungs:  Decreased breath sounds bilaterally, no wheezing, rhonchi or rales Abd: soft,  non-tender Ext: no bilateral LE edema  Skin: warm and dry Neuro:  CNs 2-12 intact, face is symmetrical  EKG:   Sinus brady, HR 52, LBBB   ASSESSMENT AND PLAN:  1. Chronic Combined Systolic and Diastolic CHF:   Volume appears stable.  Weight is down somewhat.  I suspect his weight gain and fatigue is related to more than volume excess.   2. Fatigue:  Check TSH.  Consider reducing dose of Coreg - but HR has been in 50s in the past.  HR is better on slightly lower dose of Amiodarone.  Question depression.  May be worthwhile to FU with PCP if workup not revealing.   3. Non-Ischemic CM:   Consider candidacy for ICD at FU with Dr. Rollene Rotunda since leg wounds are completely healed.  Continue current dose of beta blocker for.  Consider lowering dose in FU as noted above.  He is not on ACEI due to CKD.  Continue hydralazine and nitrates.   4. CAD:   No angina. He is not on aspirin as he is on Coumadin. Continue statin.  With his renal function, he would not be a good candidate for invasive evaluation if symptoms change. 5. Atrial Fibrillation:   Maintaining NSR.  He remains on coumadin and Amiodarone.  Recent LFTs ok.  Check TSH today given recent hx of fatigue.    6. Chronic Kidney Disease:   Renal function has worsened recently.  He would benefit from being followed by nephrology.  Refer to Nephrology.  Repeat BMET today.    7. Hypertension:   BP elevated.  Increase Hydralazine to 75 mg TID.    8. Hyperlipidemia:   Continue statin. 9. Urinary Hesitancy:  Check UA today. If normal, FU with PCP - ? BPH. 10. Lower Extremity Ulcers:  These have healed.   Disposition: FU with me or Dr. Rollene Rotunda in 2 weeks.     Signed, Frank Mclean, MHS 09/16/2014 10:00 AM    [facility] [address] [place], [place], [place]  [address] Phone: [phone]; Fax: [phone]

## 2014-09-16 NOTE — Patient Instructions (Signed)
Will obtain labs today and call you with the results (BMET/BNP/TSH/URINALYSIS)  INCREASE YOUR HYDRALAZINE TO 75 MG THREE TIMES A DAY  WILL ARRANGE FOR YOU TO SEE NEPHROLOGY SOON  Your physician recommends that you schedule a follow-up appointment in: in 2 weeks with Dr Antoine Poche or Bing Neighbors PA

## 2014-09-17 ENCOUNTER — Ambulatory Visit: Payer: Medicare Other | Admitting: Physician Assistant

## 2014-09-17 ENCOUNTER — Other Ambulatory Visit: Payer: Medicare Other

## 2014-09-18 ENCOUNTER — Other Ambulatory Visit: Payer: Self-pay | Admitting: Internal Medicine

## 2014-09-18 ENCOUNTER — Telehealth: Payer: Self-pay | Admitting: *Deleted

## 2014-09-18 DIAGNOSIS — I5043 Acute on chronic combined systolic (congestive) and diastolic (congestive) heart failure: Secondary | ICD-10-CM

## 2014-09-18 DIAGNOSIS — N189 Chronic kidney disease, unspecified: Secondary | ICD-10-CM

## 2014-09-18 DIAGNOSIS — R5382 Chronic fatigue, unspecified: Secondary | ICD-10-CM

## 2014-09-18 NOTE — Telephone Encounter (Signed)
lmptcb for lab results and med changes 

## 2014-09-19 ENCOUNTER — Ambulatory Visit: Payer: Medicare Other | Admitting: Physician Assistant

## 2014-09-19 ENCOUNTER — Encounter: Payer: Self-pay | Admitting: *Deleted

## 2014-09-19 MED ORDER — POTASSIUM CHLORIDE CRYS ER 20 MEQ PO TBCR
EXTENDED_RELEASE_TABLET | ORAL | Status: DC
Start: 2014-09-19 — End: 2014-09-30

## 2014-09-19 MED ORDER — TORSEMIDE 20 MG PO TABS: ORAL_TABLET | ORAL | Status: DC

## 2014-09-19 MED ORDER — LEVOTHYROXINE SODIUM 25 MCG PO TABS: 25.0000 ug | ORAL_TABLET | Freq: Every day | ORAL | Status: DC

## 2014-09-19 NOTE — Telephone Encounter (Signed)
pt's wife notified about lab results and med changes with verbal understanding. BMET 10/7. TSH, FREE T4 10/22/14. Rx for synthroid 25 mcg sent in today.

## 2014-09-19 NOTE — Telephone Encounter (Signed)
lmptcb x 2 for lab results 

## 2014-09-24 DIAGNOSIS — I429 Cardiomyopathy, unspecified: Secondary | ICD-10-CM | POA: Diagnosis not present

## 2014-09-24 DIAGNOSIS — N183 Chronic kidney disease, stage 3 (moderate): Secondary | ICD-10-CM | POA: Diagnosis not present

## 2014-09-25 ENCOUNTER — Other Ambulatory Visit: Payer: Medicare Other

## 2014-09-26 ENCOUNTER — Encounter: Payer: Self-pay | Admitting: Physician Assistant

## 2014-09-30 ENCOUNTER — Telehealth: Payer: Self-pay | Admitting: *Deleted

## 2014-09-30 DIAGNOSIS — I5043 Acute on chronic combined systolic (congestive) and diastolic (congestive) heart failure: Secondary | ICD-10-CM

## 2014-09-30 DIAGNOSIS — N189 Chronic kidney disease, unspecified: Secondary | ICD-10-CM

## 2014-09-30 MED ORDER — TORSEMIDE 20 MG PO TABS: ORAL_TABLET | ORAL | Status: DC

## 2014-09-30 MED ORDER — POTASSIUM CHLORIDE CRYS ER 20 MEQ PO TBCR: EXTENDED_RELEASE_TABLET | ORAL | Status: DC

## 2014-09-30 NOTE — Telephone Encounter (Signed)
s/w pt's wife about lab results and med changes with lasix and K+. Pt has f/u 10/13 w/Scott W. PA, will get lab work then .

## 2014-10-01 ENCOUNTER — Ambulatory Visit (INDEPENDENT_AMBULATORY_CARE_PROVIDER_SITE_OTHER): Payer: Medicare Other | Admitting: Physician Assistant

## 2014-10-01 ENCOUNTER — Encounter: Payer: Self-pay | Admitting: Physician Assistant

## 2014-10-01 VITALS — BP 150/100 | HR 51 | Ht 74.0 in | Wt 238.0 lb

## 2014-10-01 DIAGNOSIS — I5042 Chronic combined systolic (congestive) and diastolic (congestive) heart failure: Secondary | ICD-10-CM

## 2014-10-01 DIAGNOSIS — I480 Paroxysmal atrial fibrillation: Secondary | ICD-10-CM

## 2014-10-01 DIAGNOSIS — N183 Chronic kidney disease, stage 3 unspecified: Secondary | ICD-10-CM

## 2014-10-01 DIAGNOSIS — I10 Essential (primary) hypertension: Secondary | ICD-10-CM

## 2014-10-01 DIAGNOSIS — I429 Cardiomyopathy, unspecified: Secondary | ICD-10-CM | POA: Diagnosis not present

## 2014-10-01 DIAGNOSIS — I2510 Atherosclerotic heart disease of native coronary artery without angina pectoris: Secondary | ICD-10-CM | POA: Diagnosis not present

## 2014-10-01 DIAGNOSIS — I447 Left bundle-branch block, unspecified: Secondary | ICD-10-CM | POA: Diagnosis not present

## 2014-10-01 DIAGNOSIS — I639 Cerebral infarction, unspecified: Secondary | ICD-10-CM | POA: Diagnosis not present

## 2014-10-01 DIAGNOSIS — I428 Other cardiomyopathies: Secondary | ICD-10-CM

## 2014-10-01 DIAGNOSIS — E032 Hypothyroidism due to medicaments and other exogenous substances: Secondary | ICD-10-CM

## 2014-10-01 NOTE — Progress Notes (Signed)
Cardiology Office Note    Date:  10/01/2014   ID:  Frank Mclean, DOB 02/04/1941, MRN [id number]  PCP:  Sonda PrimesAlex Plotnikov, MD  Cardiologist:  Dr. Rollene RotundaJames Hochrein     History of Present Illness: Frank Mclean is a 73 y.o. male with a hx of chronic combined systolic and diastolic CHF, NICM (EF 25-30%), nonobstructive CAD (cath 2012), AFib, CAD, HTN, HL, PAD, LE venous ulcers (followed by wound clinic).  Admitted 05/2013 for AFib with RVR complicated by type II non-STEMI and a/c combined systolic and diastolic CHF. He was placed on amiodarone at that time with restoration of NSR.   Admitted 08/2013 for a/c renal failure.   He was felt to be dehydrated and he was gently hydrated with IVFs ("warm and dry") and supported with low dose dopamine.       Admitted 11/2013 with acute respiratory failure secondary to community acquired pneumonia complicated by decompensated heart failure.   I have seen him quite often over the past few weeks with worsening creatinine and weight gain.  I have referred him to Nephrology and he has seen Dr. Lowell GuitarPowell.  His TSH was significantly elevated and he was started on Synthroid.  He returns for FU with his wife.  She remains quite attentive to him and does an wonderful job watching his weights.  She knows when to give him extra diuretics.  She gave him Metolazone last week for a 5 lb weight gain with good results.  He remains chronically short of breath.  He is NYHA 2b-3.  He sleeps on 3 pillows.  He usually feels better at night when he wears his oxygen.  He needs this renewed with HH.  He denies chest pain, syncope.     Recent Labs: 06/24/2014: LDL (calc) 186*  08/12/2014: ALT 21  09/12/2014: Hemoglobin 12.4*  09/16/2014: Creatinine 3.2*; Potassium 3.8; Pro B Natriuretic peptide (BNP) 74.0; TSH 82.00 Repeated and verified X2.*    Wt Readings from Last 3 Encounters:  10/01/14 238 lb (107.956 kg)  09/16/14 240 lb (108.863 kg)  09/12/14 244 lb (110.678 kg)      Past Medical History  Diagnosis Date  . Gout   . HTN (hypertension)   . Hyperlipidemia   . Osteoarthritis   . History of CVA (cerebrovascular accident)     Left pontine infarct July 2004; changed from Plavix to Coumadin in 2004 [facility]  . GERD (gastroesophageal reflux disease)   . DJD (degenerative joint disease)   . BPH (benign prostatic hypertrophy)   . DM (diabetes mellitus), type 2   . Peripheral vascular disease   . Bilateral leg ulcer     ACHILLES AREA--  NONHEALING  . CKD (chronic kidney disease) stage 3, GFR 30-59 ml/min   . CAD (coronary artery disease)     a. LHC 4/12: Mid LAD 25%, mid diagonal 30%, AV circumflex 40%, proximal OM 25%, distal RCA 40%  . Chronic combined systolic and diastolic heart failure     a. Echo 05/2013: EF 25-30%, diffuse HK, restrictive physiology, trivial AI, mild MR, moderate LAE, reduced RV systolic function, PASP 42  . LBBB (left bundle branch block)   . Atrial fibrillation     a. amiodarone Rx started 05/2013;  b. chronic coumadin  . NICM (nonischemic cardiomyopathy)     a. EF 25-30%.    Current Outpatient Prescriptions  Medication Sig Dispense Refill  . amiodarone (PACERONE) 200 MG tablet Take 1 tablet (200 mg total) by  mouth as directed. 200 MG M-F.[name] 100 MG ON Saturday AND SUNDAY'S      . amLODipine (NORVASC) 10 MG tablet Take 10 mg by mouth daily.      [name] atorvastatin (LIPITOR) 20 MG tablet Take 20 mg by mouth at bedtime.      . budesonide-formoterol (SYMBICORT) 160-4.5 MCG/ACT inhaler Inhale 2 puffs into the lungs daily as needed (shortness of breath).      . carvedilol (COREG) 6.25 MG tablet TAKE 1 TABLET (6.25 MG TOTAL) BY MOUTH 2 (TWO) TIMES DAILY WITH A MEAL.  60 tablet  5  . cholecalciferol (VITAMIN D) 1000 UNITS tablet Take 1,000 Units by mouth daily.      . CVS VITAMIN B12 1000 MCG tablet TAKE 1 TABLET BY MOUTH DAILY  100 tablet  3  . ferrous sulfate 325 (65 FE) MG tablet TAKE 1 TABLET BY MOUTH EVERY DAY  30 tablet  6   . glipiZIDE (GLUCOTROL) 5 MG tablet TAKE 1/2 TABLET BY MOUTH TWICE A DAY BEFORE A MEAL  30 tablet  3  . hydrALAZINE (APRESOLINE) 50 MG tablet 1 and 1/2 tablet three times a day  135 tablet  3  . HYDROcodone-acetaminophen (NORCO/VICODIN) 5-325 MG per tablet Take 1 tablet by mouth 2 (two) times daily as needed.  60 tablet  0  . isosorbide mononitrate (IMDUR) 60 MG 24 hr tablet TAKE 1 TABLET (60 MG TOTAL) BY MOUTH DAILY.  30 tablet  5  . levothyroxine (SYNTHROID, LEVOTHROID) 25 MCG tablet Take 1 tablet (25 mcg total) by mouth daily before breakfast.  30 tablet  11  . metolazone (ZAROXOLYN) 5 MG tablet Take 5 mg by mouth as needed.      . potassium chloride SA (KLOR-CON M20) 20 MEQ tablet Take 3 tabs TWICE DAILY      . torsemide (DEMADEX) 20 MG tablet 20 MG DAILY      . triamcinolone cream (KENALOG) 0.5 % Apply 1 application topically 2 (two) times daily as needed (for skin breakdown).  30 g  0  . warfarin (COUMADIN) 5 MG tablet TAKE 1 TABLET (5 MG TOTAL) BY MOUTH AS DIRECTED.  40 tablet  2   No current facility-administered medications for this visit.    Allergies:   No Known Allergies  Social History:  The patient  reports that he quit smoking about 40 years ago. He has never used smokeless tobacco. He reports that he does not drink alcohol or use illicit drugs.    Family History:  [name] family history includes Diabetes in his mother; Gout in his other; Heart attack in his maternal grandmother; Heart disease in his father and mother; Hypertension in his father and mother; Stroke in his other.   ROS:  Please see the history of present illness.     All other systems reviewed and negative.   PHYSICAL EXAM: VS:  BP 150/100  Pulse 51  Ht 6\' 2"  (1.88 m)  Wt 238 lb (107.956 kg)  BMI 30.54 kg/m2 Well nourished, well developed, in no acute distress HEENT: normal Neck: no JVD   Cardiac:  normal S1, S2; RRR; no murmur; no rubno S3 Lungs:  Decreased breath sounds bilaterally, no  wheezing, rhonchi or rales Abd: soft, non-tender Ext: no bilateral LE edema  Skin: warm and dry Neuro:  CNs 2-12 intact, face is symmetrical  EKG:   Sinus brady, HR 51, LBBB   ASSESSMENT AND PLAN:  1. Chronic Combined Systolic and Diastolic CHF:   Volume appears  stable.  He and his wife do an excellent job of watching his weight and administering prn diuretics.  He does better when he wears his oxygen at night.  He has benefited from this and typically wears it.  He needs a renewal sent to home health and we will arrange that.     2. Hypothyroidism:  Continue synthroid.  Repeat labs are pending.    3. Non-Ischemic CM:   Consider candidacy for ICD at FU with Dr. Rollene Rotunda since leg wounds are completely healed.  Continue current dose of beta blocker.  He is not on ACEI due to CKD.  Continue hydralazine and nitrates.   4. CAD:   No angina. He is not on aspirin as he is on Coumadin. Continue statin.  With his renal function, he would not be a good candidate for invasive evaluation if symptoms change. 5. Atrial Fibrillation:   Maintaining NSR.  He remains on coumadin and Amiodarone.      6. Chronic Kidney Disease:   FU with nephrology.    7. Hypertension:   BP elevated.  No meds yet today.  His wife notes that his BP has been good at home.     8. Hyperlipidemia:   Continue statin. 9. Lower Extremity Ulcers:  These have healed.   Disposition: FU with  Dr. Rollene Rotunda 6 weeks.     Signed, Brynda Rim, MHS 10/01/2014 12:30 PM    [facility] [address] [place], [place], [place]  [address] Phone: [phone]; Fax: [phone]

## 2014-10-01 NOTE — Patient Instructions (Addendum)
Your physician recommends that you continue on your current medications as directed. Please refer to the Current Medication list given to you today. Your physician recommends that you schedule a follow-up appointment in: 6-8 weeks with Dr. Hochrein.  

## 2014-10-04 ENCOUNTER — Other Ambulatory Visit: Payer: Self-pay

## 2014-10-22 ENCOUNTER — Other Ambulatory Visit: Payer: Medicare Other

## 2014-10-28 ENCOUNTER — Other Ambulatory Visit (INDEPENDENT_AMBULATORY_CARE_PROVIDER_SITE_OTHER): Payer: Medicare Other | Admitting: *Deleted

## 2014-10-28 ENCOUNTER — Ambulatory Visit (INDEPENDENT_AMBULATORY_CARE_PROVIDER_SITE_OTHER): Payer: Medicare Other

## 2014-10-28 DIAGNOSIS — R5382 Chronic fatigue, unspecified: Secondary | ICD-10-CM

## 2014-10-28 DIAGNOSIS — Z7901 Long term (current) use of anticoagulants: Secondary | ICD-10-CM

## 2014-10-28 DIAGNOSIS — I639 Cerebral infarction, unspecified: Secondary | ICD-10-CM

## 2014-10-28 DIAGNOSIS — Z5181 Encounter for therapeutic drug level monitoring: Secondary | ICD-10-CM

## 2014-10-28 DIAGNOSIS — I6350 Cerebral infarction due to unspecified occlusion or stenosis of unspecified cerebral artery: Secondary | ICD-10-CM

## 2014-10-28 LAB — POCT INR: INR: 1.7

## 2014-10-28 LAB — TSH: TSH: 105.79 u[IU]/mL — ABNORMAL HIGH (ref 0.35–4.50)

## 2014-10-28 LAB — T4, FREE: Free T4: 0.36 ng/dL — ABNORMAL LOW (ref 0.60–1.60)

## 2014-10-30 ENCOUNTER — Other Ambulatory Visit: Payer: Self-pay | Admitting: Internal Medicine

## 2014-10-30 DIAGNOSIS — E039 Hypothyroidism, unspecified: Secondary | ICD-10-CM

## 2014-10-31 ENCOUNTER — Other Ambulatory Visit: Payer: Self-pay

## 2014-10-31 ENCOUNTER — Telehealth: Payer: Self-pay

## 2014-10-31 DIAGNOSIS — E038 Other specified hypothyroidism: Secondary | ICD-10-CM

## 2014-10-31 MED ORDER — LEVOTHYROXINE SODIUM 100 MCG PO TABS: 100.0000 ug | ORAL_TABLET | Freq: Every day | ORAL | Status: DC

## 2014-10-31 NOTE — Telephone Encounter (Signed)
Patient's wife informed in results note

## 2014-10-31 NOTE — Telephone Encounter (Deleted)
Patient's wife calls today stating she thinks he is to have a TEE today, but says no one called them with time or any other instructions. He has eaten 2 hours ago.  Have cancelled this with endoscopy and Dr. Delton See is going to be notified. Will call patient back when we get him rescheduled.

## 2014-11-01 ENCOUNTER — Ambulatory Visit (INDEPENDENT_AMBULATORY_CARE_PROVIDER_SITE_OTHER): Payer: Medicare Other | Admitting: Endocrinology

## 2014-11-01 ENCOUNTER — Encounter: Payer: Self-pay | Admitting: Endocrinology

## 2014-11-01 VITALS — BP 132/88 | HR 57 | Temp 97.9°F | Ht 75.0 in | Wt 240.0 lb

## 2014-11-01 DIAGNOSIS — E038 Other specified hypothyroidism: Secondary | ICD-10-CM

## 2014-11-01 DIAGNOSIS — T462X1A Poisoning by other antidysrhythmic drugs, accidental (unintentional), initial encounter: Principal | ICD-10-CM

## 2014-11-01 DIAGNOSIS — I639 Cerebral infarction, unspecified: Secondary | ICD-10-CM | POA: Diagnosis not present

## 2014-11-01 DIAGNOSIS — E032 Hypothyroidism due to medicaments and other exogenous substances: Secondary | ICD-10-CM | POA: Insufficient documentation

## 2014-11-01 NOTE — Patient Instructions (Signed)
Your thyroid is underactive, probably due to the amiodarone. Please continue the same thyroid medication. Please come back for a follow-up appointment in 10 days.

## 2014-11-01 NOTE — Progress Notes (Signed)
Subjective:    Patient ID: Frank Mclean, male    DOB: August 04, 1941, 73 y.o.   MRN: [id number]  HPI Hx is from pt and wife.  He has been on amiodarone since early 2015.  Pt has 1 month of moderate swelling around the eyes, and assoc weight gain.   He has no prior h/o thyroid disease.   Past Medical History  Diagnosis Date  . Gout   . HTN (hypertension)   . Hyperlipidemia   . Osteoarthritis   . History of CVA (cerebrovascular accident)     Left pontine infarct July 2004; changed from Plavix to Coumadin in 2004 per MD at [facility]  . GERD (gastroesophageal reflux disease)   . DJD (degenerative joint disease)   . BPH (benign prostatic hypertrophy)   . DM (diabetes mellitus), type 2   . Peripheral vascular disease   . Bilateral leg ulcer     ACHILLES AREA--  NONHEALING  . CKD (chronic kidney disease) stage 3, GFR 30-59 ml/min   . CAD (coronary artery disease)     a. LHC 4/12: Mid LAD 25%, mid diagonal 30%, AV circumflex 40%, proximal OM 25%, distal RCA 40%  . Chronic combined systolic and diastolic heart failure     a. Echo 05/2013: EF 25-30%, diffuse HK, restrictive physiology, trivial AI, mild MR, moderate LAE, reduced RV systolic function, PASP 42  . LBBB (left bundle branch block)   . Atrial fibrillation     a. amiodarone Rx started 05/2013;  b. chronic coumadin  . NICM (nonischemic cardiomyopathy)     a. EF 25-30%.    Past Surgical History  Procedure Laterality Date  . Cardiac catheterization  04-16-2011   DR [facility]    NON-OBSTRUCTIVE CAD. MILDLY ELEVATED PULMONARY PRESSURES/ ELEVATED  END-DIASTOLIC PRESSURE  . Transthoracic echocardiogram  12-31-2010    MODERATE CONCENTRIC LVH/ SYSTOLIC FUNCTION SEVERELY REDUCED/ EF 25-30%/  SEVERE HYPOKINESIS OF ANTEROSEPTAL MYOCARDIUM  AND ENTIREAPICAL MYOCARDIUM /  MODERATE HYPOKINESIS OF LATERAL, INFEROLATERAL, INFERIOR,AND INFEROSEPTAL MYOCARIUM/  GRADE 3 DIASTOLIC DYSFUNCTION/ MILD MR  . Aortogram w/ bilateral lower extremitiy runoff   05-18-2013  DR FIELDS    LEFT LEG OCCLUDED PERONEAL AND ANTERIOR TIBIAL ARTERIES/ HIGH GRADE STENOIS 80% MIDDLE AND DISTAL THIRD OF POSTERIOR TIBIAL ARTERY/ RIGHT PERONEAL AND ANTERIOR TIBIAL ARTERY OCCLUDED/ 40% STENOSIS DISTALLY     History   Social History  . Marital Status: Married    Spouse Name: N/A    Number of Children: N/A  . Years of Education: N/A   Occupational History  . Retired - Working in Product manager    Social History Main Topics  . Smoking status: Former Smoker    Quit date: 04/12/1974  . Smokeless tobacco: Never Used  . Alcohol Use: No     Comment: previously drank - "love cognac" quit 15 yrs ago.  . Drug Use: No  . Sexual Activity: No   Other Topics Concern  . Not on file   Social History Narrative   Worked at [employer] year.  He is married and lives with his wife [name] in [place].  Has one daughter Lowella Bandy    Current Outpatient Prescriptions on File Prior to Visit  Medication Sig Dispense Refill  . amiodarone (PACERONE) 200 MG tablet Take 1 tablet (200 mg total) by mouth as directed. 200 MG M-F.[name] 100 MG ON Saturday AND SUNDAY'S    . amLODipine (NORVASC) 10 MG tablet Take 10 mg by mouth daily.    [name] atorvastatin (  LIPITOR) 20 MG tablet Take 20 mg by mouth at bedtime.    . budesonide-formoterol (SYMBICORT) 160-4.5 MCG/ACT inhaler Inhale 2 puffs into the lungs daily as needed (shortness of breath).    . carvedilol (COREG) 6.25 MG tablet TAKE 1 TABLET (6.25 MG TOTAL) BY MOUTH 2 (TWO) TIMES DAILY WITH A MEAL. 60 tablet 5  . cholecalciferol (VITAMIN D) 1000 UNITS tablet Take 1,000 Units by mouth daily.    . CVS VITAMIN B12 1000 MCG tablet TAKE 1 TABLET BY MOUTH DAILY 100 tablet 3  . ferrous sulfate 325 (65 FE) MG tablet TAKE 1 TABLET BY MOUTH EVERY DAY 30 tablet 6  . glipiZIDE (GLUCOTROL) 5 MG tablet TAKE 1/2 TABLET BY MOUTH TWICE A DAY BEFORE A MEAL 30 tablet 3  . hydrALAZINE (APRESOLINE) 50 MG tablet 1 and 1/2 tablet three times a day 135 tablet 3  .  HYDROcodone-acetaminophen (NORCO/VICODIN) 5-325 MG per tablet Take 1 tablet by mouth 2 (two) times daily as needed. 60 tablet 0  . isosorbide mononitrate (IMDUR) 60 MG 24 hr tablet TAKE 1 TABLET (60 MG TOTAL) BY MOUTH DAILY. 30 tablet 5  . levothyroxine (SYNTHROID, LEVOTHROID) 100 MCG tablet Take 1 tablet (100 mcg total) by mouth daily before breakfast. 30 tablet 1  . metolazone (ZAROXOLYN) 5 MG tablet Take 5 mg by mouth as needed.    . potassium chloride SA (KLOR-CON M20) 20 MEQ tablet Take 3 tabs TWICE DAILY    . torsemide (DEMADEX) 20 MG tablet 20 MG DAILY    . triamcinolone cream (KENALOG) 0.5 % Apply 1 application topically 2 (two) times daily as needed (for skin breakdown). 30 g 0  . warfarin (COUMADIN) 5 MG tablet TAKE 1 TABLET (5 MG TOTAL) BY MOUTH AS DIRECTED. 40 tablet 2   No current facility-administered medications on file prior to visit.    No Known Allergies  Family History  Problem Relation Age of Onset  . Hypertension Mother   . Heart disease Mother   . Diabetes Mother   . Gout Other   . Stroke Other   . Hypertension Father   . Heart disease Father   . Heart attack Maternal Grandmother   . Thyroid disease Neg Hx     BP 132/88 mmHg  Pulse 57  Temp(Src) 97.9 F (36.6 C) (Oral)  Ht 6\' 3"  (1.905 m)  Wt 240 lb (108.863 kg)  BMI 30.00 kg/m2  SpO2 97%  Review of Systems denies depression, hair loss, muscle cramps, sob, constipation, numbness, blurry vision, and syncope.   He has fatigue, myalgias, dry skin, easy bruising, rhinorrhea, and cold intolerance.     Objective:   Physical Exam VS: see vs page GEN: no distress HEAD: head: no deformity eyes: slight periorbital swelling, no proptosis external nose and ears are normal NECK: possible goiter, but i can't tell details LUNGS: clear to auscultation.  CV: reg rate and rhythm, no murmur.  MUSCULOSKELETAL: muscle bulk and strength are grossly normal.  no obvious joint swelling.  gait is normal and  steady EXTEMITIES: no edema.  NEURO:  cn 2-12 grossly intact.   readily moves all 4's.  sensation is intact to touch on all 4's.  Gait is slow but steady.   SKIN:  Normal texture and temperature.  NODES:  None palpable at the neck PSYCH: alert, well-oriented.  Does not appear anxious nor depressed.   i reviewed electrocardiogram (10/01/14).    i have reviewed the following old records: cardiol office notes.  Pt was  having TFT monitored, as he was on amiodarone.  Elevated TSH was noted.    Lab Results  Component Value Date   TSH 105.79 Repeated and verified X2.* 10/28/2014      Assessment & Plan:  Hypothyroidism, new to me, due to amiodarone.  Some patients need a high dosage of synthroid.  However, in view of h/o AF, we'll recheck TFT upon return in 10 days.   Patient is advised the following: Patient Instructions  Your thyroid is underactive, probably due to the amiodarone. Please continue the same thyroid medication. Please come back for a follow-up appointment in 10 days.

## 2014-11-12 DIAGNOSIS — N183 Chronic kidney disease, stage 3 (moderate): Secondary | ICD-10-CM | POA: Diagnosis not present

## 2014-11-12 DIAGNOSIS — I509 Heart failure, unspecified: Secondary | ICD-10-CM | POA: Diagnosis not present

## 2014-11-12 DIAGNOSIS — I10 Essential (primary) hypertension: Secondary | ICD-10-CM | POA: Diagnosis not present

## 2014-11-13 ENCOUNTER — Telehealth: Payer: Self-pay | Admitting: Cardiology

## 2014-11-13 NOTE — Telephone Encounter (Signed)
Received records from [facility] (Dr Casimiro Needle) for appointment with Dr Antoine Poche on 11/25/14.  Records given to [facility] (medical records) for Dr Hochrein's schedule on 11/25/14. lp

## 2014-11-19 ENCOUNTER — Ambulatory Visit: Payer: Medicare Other | Admitting: Endocrinology

## 2014-11-19 ENCOUNTER — Other Ambulatory Visit: Payer: Self-pay | Admitting: Internal Medicine

## 2014-11-21 ENCOUNTER — Encounter: Payer: Self-pay | Admitting: Internal Medicine

## 2014-11-21 ENCOUNTER — Ambulatory Visit (INDEPENDENT_AMBULATORY_CARE_PROVIDER_SITE_OTHER): Payer: Medicare Other | Admitting: Internal Medicine

## 2014-11-21 VITALS — BP 148/100 | HR 56 | Temp 97.7°F | Wt 237.0 lb

## 2014-11-21 DIAGNOSIS — E538 Deficiency of other specified B group vitamins: Secondary | ICD-10-CM

## 2014-11-21 DIAGNOSIS — E1122 Type 2 diabetes mellitus with diabetic chronic kidney disease: Secondary | ICD-10-CM | POA: Diagnosis not present

## 2014-11-21 DIAGNOSIS — I639 Cerebral infarction, unspecified: Secondary | ICD-10-CM

## 2014-11-21 DIAGNOSIS — N189 Chronic kidney disease, unspecified: Secondary | ICD-10-CM

## 2014-11-21 DIAGNOSIS — E038 Other specified hypothyroidism: Secondary | ICD-10-CM | POA: Diagnosis not present

## 2014-11-21 DIAGNOSIS — R5382 Chronic fatigue, unspecified: Secondary | ICD-10-CM | POA: Diagnosis not present

## 2014-11-21 DIAGNOSIS — T462X1A Poisoning by other antidysrhythmic drugs, accidental (unintentional), initial encounter: Secondary | ICD-10-CM

## 2014-11-21 DIAGNOSIS — E032 Hypothyroidism due to medicaments and other exogenous substances: Secondary | ICD-10-CM

## 2014-11-21 MED ORDER — LEVOTHYROXINE SODIUM 100 MCG PO TABS: 200.0000 ug | ORAL_TABLET | Freq: Every day | ORAL | Status: DC

## 2014-11-21 NOTE — Assessment & Plan Note (Signed)
Continue with current prescription therapy as reflected on the Med list.  

## 2014-11-21 NOTE — Progress Notes (Signed)
Subjective:    HPI   F/u fatigue, weakness, apathy, sleepiness x 1 mo  73 y.o. male w/ PMHx s/f chronic combined CHF, nonischemic cardiomyoapthy, nonobstructive CAD, PAF (amiodarone and Coumadin), chronic LBBB, DM2, HTN, HLD, PAD, CKD (stage III, baseline Cr 1.9), h/o CVA and chronic venous ulcers who was admitted to [facility] for CAP and decompensated heart failure. On O2 at night 1. Acute on chronic combined CHF/NICM, EF 25-30%  2. Nonobstructive CAD  3. PAF  4. Subtherapeutic INR now therapeutic  5. Type 2 DM  6. HTN  7. HLD  8. H/o CVA  9. CKD, stage III  10. Chronic venous ulcers  11. Hypokalemia  12. PAD  13. Chronic LBBB  14. CAP - L lung Patient improved and was discharged on 12/10.    F/u: Near syncope  Active Problems:  DM (diabetes mellitus), type 2 with renal complications  HYPERLIPIDEMIA  HYPERTENSION  Acute on chronic combined systolic and diastolic congestive heart failure  CVA  CKD (chronic kidney disease) stage 3, GFR 30-59 ml/min  Wound, open, leg  Atherosclerotic peripheral vascular disease with intermittent claudication  Orthostatic hypotension  Hypokalemia  Elevated troponin  Atrial fibrillation with RVR  Wt Readings from Last 3 Encounters:  11/21/14 237 lb (107.502 kg)  11/01/14 240 lb (108.863 kg)  10/01/14 238 lb (107.956 kg)   BP Readings from Last 3 Encounters:  11/21/14 148/100  11/01/14 132/88  10/01/14 150/100      Review of Systems  Constitutional: Positive for fatigue. Negative for appetite change and unexpected weight change.  HENT: Negative for congestion, nosebleeds, sneezing and trouble swallowing.   Eyes: Negative for itching and visual disturbance.  Cardiovascular: Positive for leg swelling. Negative for palpitations.  Gastrointestinal: Negative for nausea, diarrhea, blood in stool and abdominal distention.  Genitourinary: Negative for frequency and hematuria.  Musculoskeletal: Positive for arthralgias. Negative  for back pain, joint swelling, gait problem and neck pain.  Neurological: Negative for dizziness, tremors, speech difficulty and weakness.  Psychiatric/Behavioral: Negative for sleep disturbance, dysphoric mood and agitation. The patient is nervous/anxious.        Objective:   Physical Exam  Constitutional: He is oriented to person, place, and time. He appears well-developed. No distress.  Obese, appears chronically ill and tired NAD  HENT:  Mouth/Throat: Oropharynx is clear and moist.  Eyes: Conjunctivae are normal. Pupils are equal, round, and reactive to light.  Neck: Normal range of motion. No JVD present. No thyromegaly present.  Cardiovascular: Normal rate, regular rhythm, normal heart sounds and intact distal pulses.  Exam reveals no gallop and no friction rub.   No murmur heard. Pulmonary/Chest: Effort normal and breath sounds normal. No respiratory distress. He has no wheezes. He has no rales. He exhibits no tenderness.  Abdominal: Soft. Bowel sounds are normal. He exhibits no distension and no mass. There is no tenderness. There is no rebound and no guarding.  Musculoskeletal: Normal range of motion. He exhibits edema. He exhibits no tenderness.  Lymphadenopathy:    He has no cervical adenopathy.  Neurological: He is alert and oriented to person, place, and time. He has normal reflexes. No cranial nerve deficit. He exhibits normal muscle tone. He displays a negative Romberg sign. Coordination and gait normal.  No meningeal signs  Skin: Skin is warm and dry. No rash noted.  Psychiatric: He has a normal mood and affect. His behavior is normal. Judgment and thought content normal.  B LEs are wrapped - much better DTRs  are slow Trace edema B   Lab Results  Component Value Date   WBC 7.5 09/12/2014   HGB 12.4* 09/12/2014   HCT 38.4* 09/12/2014   PLT 187.0 09/12/2014   GLUCOSE 111* 09/16/2014   CHOL 269* 06/24/2014   TRIG 133.0 06/24/2014   HDL 56.50 06/24/2014    LDLDIRECT 177.9 06/17/2010   LDLCALC 186* 06/24/2014   ALT 21 08/12/2014   AST 27 08/12/2014   NA 146* 09/16/2014   K 3.8 09/16/2014   CL 104 09/16/2014   CREATININE 3.2* 09/16/2014   BUN 28* 09/16/2014   CO2 32 09/16/2014   TSH 105.79 Repeated and verified X2.* 10/28/2014   PSA 0.01* 11/18/2011   INR 1.7 10/28/2014   HGBA1C 5.8 06/24/2014     A complex case      Assessment & Plan:

## 2014-11-21 NOTE — Progress Notes (Signed)
Pre visit review using our clinic review tool, if applicable. No additional management support is needed unless otherwise documented below in the visit note. 

## 2014-11-21 NOTE — Assessment & Plan Note (Signed)
Will correct myxedema first

## 2014-11-21 NOTE — Assessment & Plan Note (Signed)
Clinically hypothyroid Will increase Synthroid (brand) to 200 mcg a day TSH, FT4 q 1 mo

## 2014-11-25 ENCOUNTER — Encounter: Payer: Self-pay | Admitting: Cardiology

## 2014-11-25 ENCOUNTER — Ambulatory Visit (INDEPENDENT_AMBULATORY_CARE_PROVIDER_SITE_OTHER): Payer: Medicare Other | Admitting: Pharmacist Clinician (PhC)/ Clinical Pharmacy Specialist

## 2014-11-25 ENCOUNTER — Telehealth: Payer: Self-pay | Admitting: Cardiology

## 2014-11-25 ENCOUNTER — Ambulatory Visit (INDEPENDENT_AMBULATORY_CARE_PROVIDER_SITE_OTHER): Payer: Medicare Other | Admitting: Cardiology

## 2014-11-25 ENCOUNTER — Other Ambulatory Visit (INDEPENDENT_AMBULATORY_CARE_PROVIDER_SITE_OTHER): Payer: Medicare Other

## 2014-11-25 VITALS — BP 152/82 | HR 74 | Ht 74.0 in | Wt 237.0 lb

## 2014-11-25 DIAGNOSIS — Z5181 Encounter for therapeutic drug level monitoring: Secondary | ICD-10-CM

## 2014-11-25 DIAGNOSIS — I482 Chronic atrial fibrillation, unspecified: Secondary | ICD-10-CM

## 2014-11-25 DIAGNOSIS — Z5189 Encounter for other specified aftercare: Secondary | ICD-10-CM

## 2014-11-25 DIAGNOSIS — Z418 Encounter for other procedures for purposes other than remedying health state: Secondary | ICD-10-CM

## 2014-11-25 DIAGNOSIS — Z7901 Long term (current) use of anticoagulants: Secondary | ICD-10-CM

## 2014-11-25 DIAGNOSIS — I639 Cerebral infarction, unspecified: Secondary | ICD-10-CM

## 2014-11-25 DIAGNOSIS — I6322 Cerebral infarction due to unspecified occlusion or stenosis of basilar arteries: Secondary | ICD-10-CM

## 2014-11-25 DIAGNOSIS — I6350 Cerebral infarction due to unspecified occlusion or stenosis of unspecified cerebral artery: Secondary | ICD-10-CM

## 2014-11-25 DIAGNOSIS — I4891 Unspecified atrial fibrillation: Secondary | ICD-10-CM

## 2014-11-25 LAB — PROTIME-INR
INR: 6.7 ratio — AB (ref 0.8–1.0)
Prothrombin Time: 70.8 s (ref 9.6–13.1)

## 2014-11-25 LAB — POCT INR: INR: 6.2

## 2014-11-25 NOTE — Telephone Encounter (Signed)
Lab called and gave lab values for this who had an INR check earlier during his visit with Dr. Antoine Poche, call dent to Joeseph Amor D

## 2014-11-25 NOTE — Progress Notes (Signed)
HPI The patient presents for evaluation of heart failure.  Since I last saw him he has follow with Tereso Newcomer PAc.  His volume has been well controlled. Unfortunately he's had significant problems with hypothyroidism. His biggest complaints are not dyspnea currently. Rather he is weak and not eating. His weights are stable. He's not having any new edema. He's not having any new palpitations, presyncope or syncope. He has had no chest pressure, neck or arm discomfort.   No Known Allergies  Current Outpatient Prescriptions  Medication Sig Dispense Refill  . amiodarone (PACERONE) 200 MG tablet Take 1 tablet (200 mg total) by mouth as directed. 200 MG M-F.[name] 100 MG ON Saturday AND SUNDAY'S    . amLODipine (NORVASC) 10 MG tablet Take 10 mg by mouth daily.    [name] atorvastatin (LIPITOR) 20 MG tablet Take 20 mg by mouth at bedtime.    . budesonide-formoterol (SYMBICORT) 160-4.5 MCG/ACT inhaler Inhale 2 puffs into the lungs daily as needed (shortness of breath).    . carvedilol (COREG) 6.25 MG tablet TAKE 1 TABLET (6.25 MG TOTAL) BY MOUTH 2 (TWO) TIMES DAILY WITH A MEAL. 60 tablet 5  . cholecalciferol (VITAMIN D) 1000 UNITS tablet Take 1,000 Units by mouth daily.    . CVS VITAMIN B12 1000 MCG tablet TAKE 1 TABLET BY MOUTH DAILY 100 tablet 3  . ferrous sulfate 325 (65 FE) MG tablet TAKE 1 TABLET BY MOUTH EVERY DAY 30 tablet 6  . glipiZIDE (GLUCOTROL) 5 MG tablet TAKE 1/2 TABLET BY MOUTH TWICE A DAY BEFORE A MEAL 30 tablet 3  . hydrALAZINE (APRESOLINE) 50 MG tablet 1 and 1/2 tablet three times a day 135 tablet 3  . HYDROcodone-acetaminophen (NORCO/VICODIN) 5-325 MG per tablet Take 1 tablet by mouth 2 (two) times daily as needed. 60 tablet 0  . isosorbide mononitrate (IMDUR) 60 MG 24 hr tablet TAKE 1 TABLET (60 MG TOTAL) BY MOUTH DAILY. 30 tablet 5  . levothyroxine (SYNTHROID, LEVOTHROID) 100 MCG tablet Take 2 tablets (200 mcg total) by mouth daily before breakfast. 60 tablet 1  . metolazone (ZAROXOLYN) 5  MG tablet Take 5 mg by mouth as needed.    . potassium chloride SA (KLOR-CON M20) 20 MEQ tablet Take 3 tabs TWICE DAILY    . torsemide (DEMADEX) 20 MG tablet 20 MG DAILY    . triamcinolone cream (KENALOG) 0.5 % Apply 1 application topically 2 (two) times daily as needed (for skin breakdown). 30 g 0  . warfarin (COUMADIN) 5 MG tablet TAKE 1 TABLET (5 MG TOTAL) BY MOUTH AS DIRECTED. 40 tablet 2   No current facility-administered medications for this visit.    Past Medical History  Diagnosis Date  . Gout   . HTN (hypertension)   . Hyperlipidemia   . Osteoarthritis   . History of CVA (cerebrovascular accident)     Left pontine infarct July 2004; changed from Plavix to Coumadin in 2004 per MD at [facility]  . GERD (gastroesophageal reflux disease)   . DJD (degenerative joint disease)   . BPH (benign prostatic hypertrophy)   . DM (diabetes mellitus), type 2   . Peripheral vascular disease   . Bilateral leg ulcer     ACHILLES AREA--  NONHEALING  . CKD (chronic kidney disease) stage 3, GFR 30-59 ml/min   . CAD (coronary artery disease)     a. LHC 4/12: Mid LAD 25%, mid diagonal 30%, AV circumflex 40%, proximal OM 25%, distal RCA 40%  . Chronic combined systolic  and diastolic heart failure     a. Echo 05/2013: EF 25-30%, diffuse HK, restrictive physiology, trivial AI, mild MR, moderate LAE, reduced RV systolic function, PASP 42  . LBBB (left bundle branch block)   . Atrial fibrillation     a. amiodarone Rx started 05/2013;  b. chronic coumadin  . NICM (nonischemic cardiomyopathy)     a. EF 25-30%.    Past Surgical History  Procedure Laterality Date  . Cardiac catheterization  04-16-2011   [facility]    NON-OBSTRUCTIVE CAD. MILDLY ELEVATED PULMONARY PRESSURES/ ELEVATED  END-DIASTOLIC PRESSURE  . Transthoracic echocardiogram  12-31-2010    MODERATE CONCENTRIC LVH/ SYSTOLIC FUNCTION SEVERELY REDUCED/ EF 25-30%/  SEVERE HYPOKINESIS OF ANTEROSEPTAL MYOCARDIUM  AND ENTIREAPICAL MYOCARDIUM /   MODERATE HYPOKINESIS OF LATERAL, INFEROLATERAL, INFERIOR,AND INFEROSEPTAL MYOCARIUM/  GRADE 3 DIASTOLIC DYSFUNCTION/ MILD MR  . Aortogram w/ bilateral lower extremitiy runoff  05-18-2013  DR FIELDS    LEFT LEG OCCLUDED PERONEAL AND ANTERIOR TIBIAL ARTERIES/ HIGH GRADE STENOIS 80% MIDDLE AND DISTAL THIRD OF POSTERIOR TIBIAL ARTERY/ RIGHT PERONEAL AND ANTERIOR TIBIAL ARTERY OCCLUDED/ 40% STENOSIS DISTALLY     ROS:  As stated in the HPI and negative for all other systems.  PHYSICAL EXAM Ht 6\' 2"  (1.88 m)  Wt 237 lb (107.502 kg)  BMI 30.42 kg/m2 GENERAL:  Well appearing NECK:  6o JVD, waveform within normal limits, carotid upstroke brisk and symmetric, no bruits, no thyromegaly LUNGS:  Clear to auscultation bilaterally BACK:  No CVA tenderness CHEST:  Unremarkable HEART:  PMI not displaced or sustained,S1 and S2 within normal limits, no S3, no S4, no clicks, no rubs, no murmurs ABD:  Flat, positive bowel sounds normal in frequency in pitch, no bruits, no rebound, no guarding, no midline pulsatile mass, no hepatomegaly, no splenomegaly EXT:  2 plus pulses throughout, left greater than right mild edema, no cyanosis no clubbing.    EKG:  Sinus rhythm, rate 54, left bundle branch block, left axis deviation, no change from previous. 11/25/2014  ASSESSMENT AND PLAN  CONGESTIVE HEART FAILURE -  He will continue on the meds as listed.  He seems to be euvolemic.  HYPERTENSION -  The blood pressure is at target. No change in medications is indicated. We will continue with therapeutic lifestyle changes (TLC).  PVD - His lower extremity ulcers have healed.  ATRIAL FIBRILLATION:   The patient seems to be maintaining sinus rhythm.  However, because of his significant problems with lower extremity weakness which could be neurologic and related to amiodarone and certainly could be related to the difficult to control hypothyroidism I think he needs to just come off of the amiodarone. He will stop this  today.   Lab Results  Component Value Date   TSH 105.79 Repeated and verified X2.* 10/28/2014   ALT 21 08/12/2014   AST 27 08/12/2014   ALKPHOS 78 08/12/2014   BILITOT 0.6 08/12/2014   PROT 6.5 08/12/2014   ALBUMIN 3.8 08/12/2014

## 2014-11-25 NOTE — Patient Instructions (Signed)
Your physician recommends that you schedule a follow-up appointment in: one month with Tereso Newcomer PA  Stop taking your amiodarone

## 2014-11-28 ENCOUNTER — Encounter (HOSPITAL_COMMUNITY): Payer: Self-pay | Admitting: Vascular Surgery

## 2014-11-28 ENCOUNTER — Other Ambulatory Visit: Payer: Self-pay | Admitting: Internal Medicine

## 2014-11-29 ENCOUNTER — Ambulatory Visit (INDEPENDENT_AMBULATORY_CARE_PROVIDER_SITE_OTHER): Payer: Medicare Other | Admitting: *Deleted

## 2014-11-29 DIAGNOSIS — I639 Cerebral infarction, unspecified: Secondary | ICD-10-CM

## 2014-11-29 DIAGNOSIS — Z7901 Long term (current) use of anticoagulants: Secondary | ICD-10-CM | POA: Diagnosis not present

## 2014-11-29 DIAGNOSIS — Z5181 Encounter for therapeutic drug level monitoring: Secondary | ICD-10-CM

## 2014-11-29 DIAGNOSIS — I6350 Cerebral infarction due to unspecified occlusion or stenosis of unspecified cerebral artery: Secondary | ICD-10-CM

## 2014-11-29 LAB — POCT INR: INR: 3.8

## 2014-12-03 ENCOUNTER — Other Ambulatory Visit: Payer: Self-pay | Admitting: *Deleted

## 2014-12-03 DIAGNOSIS — N183 Chronic kidney disease, stage 3 unspecified: Secondary | ICD-10-CM

## 2014-12-03 DIAGNOSIS — Z01810 Encounter for preprocedural cardiovascular examination: Secondary | ICD-10-CM

## 2014-12-09 ENCOUNTER — Ambulatory Visit (INDEPENDENT_AMBULATORY_CARE_PROVIDER_SITE_OTHER): Payer: Medicare Other | Admitting: Pharmacist

## 2014-12-09 DIAGNOSIS — Z5181 Encounter for therapeutic drug level monitoring: Secondary | ICD-10-CM | POA: Diagnosis not present

## 2014-12-09 DIAGNOSIS — I6350 Cerebral infarction due to unspecified occlusion or stenosis of unspecified cerebral artery: Secondary | ICD-10-CM

## 2014-12-09 DIAGNOSIS — I639 Cerebral infarction, unspecified: Secondary | ICD-10-CM | POA: Diagnosis not present

## 2014-12-09 DIAGNOSIS — Z7901 Long term (current) use of anticoagulants: Secondary | ICD-10-CM

## 2014-12-09 LAB — POCT INR: INR: 2.1

## 2014-12-17 ENCOUNTER — Telehealth: Payer: Self-pay | Admitting: Physician Assistant

## 2014-12-17 NOTE — Telephone Encounter (Signed)
I s/w pt's wife in regards to pt feeling sluggish and having to use O2 more often than he was. I asked how many liters of O2 she looked and said a little more than 2 L.  Amiodarone was d/c per Dr. Antoine Poche 12/7 due to thyroid 105.79 11/10 lab. She states he is more sob and having some headaches and has been using his inhaler more often. She said he started feeling like this after amiodarone was stopped. He has appt 1/7 with PCP and 1/8 with Bing Neighbors. PA. I advised I will have a Triage nurse cb [phone] to assess pt further with his sob since he does have heart failure. Wife agreeable to cb from Triage nurse.

## 2014-12-17 NOTE — Telephone Encounter (Signed)
New Msg      Pt wife calling, BP medication has changed and pt is sluggish and is using his oxygen during the day when he normally doesn't.     Please contact wife about these concerns.

## 2014-12-18 NOTE — Telephone Encounter (Signed)
Spoke with patient and wife about his sob and fatigue.

## 2014-12-18 NOTE — Telephone Encounter (Signed)
He is only using his oxygen at hs. Also only using Symbicort once daily instead of bid Encouraged to use it as directed. Denies swelling or weight gain. Appetite fair. Advised Tylenol for his headache. Appointments next week with PCP and Wende Mott PA, But i told them if he felt like he needed to be seen sooner, to let us know. They both voiced understanding.

## 2014-12-23 ENCOUNTER — Other Ambulatory Visit (INDEPENDENT_AMBULATORY_CARE_PROVIDER_SITE_OTHER): Payer: Medicare Other

## 2014-12-23 DIAGNOSIS — E038 Other specified hypothyroidism: Secondary | ICD-10-CM

## 2014-12-23 LAB — T4, FREE: FREE T4: 1.59 ng/dL (ref 0.60–1.60)

## 2014-12-23 LAB — TSH: TSH: 6.04 u[IU]/mL — AB (ref 0.35–4.50)

## 2014-12-26 ENCOUNTER — Other Ambulatory Visit (HOSPITAL_COMMUNITY): Payer: Medicare Other

## 2014-12-26 ENCOUNTER — Ambulatory Visit: Payer: Medicare Other | Admitting: Vascular Surgery

## 2014-12-26 ENCOUNTER — Encounter: Payer: Self-pay | Admitting: Internal Medicine

## 2014-12-26 ENCOUNTER — Encounter (HOSPITAL_COMMUNITY): Payer: Medicare Other

## 2014-12-26 ENCOUNTER — Ambulatory Visit (INDEPENDENT_AMBULATORY_CARE_PROVIDER_SITE_OTHER): Payer: Medicare Other | Admitting: Internal Medicine

## 2014-12-26 VITALS — BP 112/88 | HR 59 | Temp 98.2°F | Ht 75.0 in | Wt 226.5 lb

## 2014-12-26 DIAGNOSIS — I10 Essential (primary) hypertension: Secondary | ICD-10-CM

## 2014-12-26 DIAGNOSIS — R0902 Hypoxemia: Secondary | ICD-10-CM

## 2014-12-26 DIAGNOSIS — I4891 Unspecified atrial fibrillation: Secondary | ICD-10-CM | POA: Diagnosis not present

## 2014-12-26 DIAGNOSIS — E538 Deficiency of other specified B group vitamins: Secondary | ICD-10-CM | POA: Diagnosis not present

## 2014-12-26 DIAGNOSIS — E1122 Type 2 diabetes mellitus with diabetic chronic kidney disease: Secondary | ICD-10-CM | POA: Diagnosis not present

## 2014-12-26 DIAGNOSIS — E032 Hypothyroidism due to medicaments and other exogenous substances: Secondary | ICD-10-CM

## 2014-12-26 DIAGNOSIS — N189 Chronic kidney disease, unspecified: Secondary | ICD-10-CM

## 2014-12-26 DIAGNOSIS — E038 Other specified hypothyroidism: Secondary | ICD-10-CM

## 2014-12-26 DIAGNOSIS — I509 Heart failure, unspecified: Secondary | ICD-10-CM

## 2014-12-26 DIAGNOSIS — T462X1A Poisoning by other antidysrhythmic drugs, accidental (unintentional), initial encounter: Secondary | ICD-10-CM

## 2014-12-26 MED ORDER — HYDROCODONE-ACETAMINOPHEN 5-325 MG PO TABS: 1.0000 | ORAL_TABLET | Freq: Two times a day (BID) | ORAL | Status: DC | PRN

## 2014-12-26 NOTE — Assessment & Plan Note (Signed)
12/15 off Amiodarone Continue with current prescription therapy as reflected on the Med list. Labs are ok Check labs in 1 mo

## 2014-12-26 NOTE — Assessment & Plan Note (Signed)
Stopped Amiodarone in 1/16

## 2014-12-26 NOTE — Assessment & Plan Note (Signed)
Continue with current prescription therapy as reflected on the Med list.  

## 2014-12-26 NOTE — Progress Notes (Signed)
Pre visit review using our clinic review tool, if applicable. No additional management support is needed unless otherwise documented below in the visit note. 

## 2014-12-26 NOTE — Progress Notes (Signed)
Subjective:    HPI  C/o fatigue, SOB, weakness, apathy, sleepiness x 2 weeks and hypothyroidism  74 y.o. male w/ PMHx s/f chronic combined CHF, nonischemic cardiomyoapthy, nonobstructive CAD, PAF (amiodarone and Coumadin), chronic LBBB, DM2, HTN, HLD, PAD, CKD (stage III, baseline Cr 1.9), h/o CVA and chronic venous ulcers who was admitted to [facility] for CAP and decompensated heart failure. On O2 at night 1. Acute on chronic combined CHF/NICM, EF 25-30%  2. Nonobstructive CAD  3. PAF  4. Subtherapeutic INR now therapeutic  5. Type 2 DM  6. HTN  7. HLD  8. H/o CVA  9. CKD, stage III  10. Chronic venous ulcers  11. Hypokalemia  12. PAD  13. Chronic LBBB  14. CAP - L lung Patient improved and was discharged on 12/10.    F/u: Near syncope  Active Problems:  DM (diabetes mellitus), type 2 with renal complications  HYPERLIPIDEMIA  HYPERTENSION  Acute on chronic combined systolic and diastolic congestive heart failure  CVA  CKD (chronic kidney disease) stage 3, GFR 30-59 ml/min  Wound, open, leg  Atherosclerotic peripheral vascular disease with intermittent claudication  Orthostatic hypotension  Hypokalemia  Elevated troponin  Atrial fibrillation with RVR  Wt Readings from Last 3 Encounters:  12/26/14 226 lb 8 oz (102.74 kg)  11/25/14 237 lb (107.502 kg)  11/21/14 237 lb (107.502 kg)   BP Readings from Last 3 Encounters:  12/26/14 112/88  11/25/14 152/82  11/21/14 148/100      Review of Systems  Constitutional: Positive for fatigue. Negative for appetite change and unexpected weight change.  HENT: Negative for congestion, nosebleeds, sneezing and trouble swallowing.   Eyes: Negative for itching and visual disturbance.  Cardiovascular: Positive for leg swelling. Negative for palpitations.  Gastrointestinal: Negative for nausea, diarrhea, blood in stool and abdominal distention.  Genitourinary: Negative for frequency and hematuria.  Musculoskeletal:  Positive for arthralgias. Negative for back pain, joint swelling, gait problem and neck pain.  Neurological: Negative for dizziness, tremors, speech difficulty and weakness.  Psychiatric/Behavioral: Negative for sleep disturbance, dysphoric mood and agitation. The patient is nervous/anxious.        Objective:   Physical Exam  Constitutional: He is oriented to person, place, and time. He appears well-developed. No distress.  Obese, appears chronically ill and tired NAD  HENT:  Mouth/Throat: Oropharynx is clear and moist.  Eyes: Conjunctivae are normal. Pupils are equal, round, and reactive to light.  Neck: Normal range of motion. No JVD present. No thyromegaly present.  Cardiovascular: Normal rate, regular rhythm, normal heart sounds and intact distal pulses.  Exam reveals no gallop and no friction rub.   No murmur heard. Pulmonary/Chest: Effort normal and breath sounds normal. No respiratory distress. He has no wheezes. He has no rales. He exhibits no tenderness.  Abdominal: Soft. Bowel sounds are normal. He exhibits no distension and no mass. There is no tenderness. There is no rebound and no guarding.  Musculoskeletal: Normal range of motion. He exhibits edema. He exhibits no tenderness.  Lymphadenopathy:    He has no cervical adenopathy.  Neurological: He is alert and oriented to person, place, and time. He has normal reflexes. No cranial nerve deficit. He exhibits normal muscle tone. He displays a negative Romberg sign. Coordination and gait normal.  No meningeal signs  Skin: Skin is warm and dry. No rash noted.  Psychiatric: He has a normal mood and affect. His behavior is normal. Judgment and thought content normal.  B LEs are wrapped -  much better DTRs are slow Trace edema B   Lab Results  Component Value Date   WBC 7.5 09/12/2014   HGB 12.4* 09/12/2014   HCT 38.4* 09/12/2014   PLT 187.0 09/12/2014   GLUCOSE 111* 09/16/2014   CHOL 269* 06/24/2014   TRIG 133.0 06/24/2014    HDL 56.50 06/24/2014   LDLDIRECT 177.9 06/17/2010   LDLCALC 186* 06/24/2014   ALT 21 08/12/2014   AST 27 08/12/2014   NA 146* 09/16/2014   K 3.8 09/16/2014   CL 104 09/16/2014   CREATININE 3.2* 09/16/2014   BUN 28* 09/16/2014   CO2 32 09/16/2014   TSH 6.04* 12/23/2014   PSA 0.01* 11/18/2011   INR 2.1 12/09/2014   HGBA1C 5.8 06/24/2014          Assessment & Plan:

## 2014-12-26 NOTE — Patient Instructions (Signed)
Check TSH in 6 weeks.

## 2014-12-26 NOTE — Assessment & Plan Note (Signed)
Stopped Amiodarone in 1/16 due to Mixedema sx's

## 2014-12-27 ENCOUNTER — Telehealth: Payer: Self-pay | Admitting: *Deleted

## 2014-12-27 ENCOUNTER — Ambulatory Visit (INDEPENDENT_AMBULATORY_CARE_PROVIDER_SITE_OTHER): Payer: Medicare Other | Admitting: Physician Assistant

## 2014-12-27 ENCOUNTER — Encounter: Payer: Self-pay | Admitting: Physician Assistant

## 2014-12-27 ENCOUNTER — Telehealth: Payer: Self-pay | Admitting: Internal Medicine

## 2014-12-27 ENCOUNTER — Ambulatory Visit (INDEPENDENT_AMBULATORY_CARE_PROVIDER_SITE_OTHER): Payer: Medicare Other

## 2014-12-27 VITALS — BP 120/84 | HR 51 | Ht 75.0 in | Wt 222.0 lb

## 2014-12-27 DIAGNOSIS — N183 Chronic kidney disease, stage 3 unspecified: Secondary | ICD-10-CM

## 2014-12-27 DIAGNOSIS — I5042 Chronic combined systolic (congestive) and diastolic (congestive) heart failure: Secondary | ICD-10-CM

## 2014-12-27 DIAGNOSIS — E032 Hypothyroidism due to medicaments and other exogenous substances: Secondary | ICD-10-CM

## 2014-12-27 DIAGNOSIS — R0602 Shortness of breath: Secondary | ICD-10-CM

## 2014-12-27 DIAGNOSIS — I10 Essential (primary) hypertension: Secondary | ICD-10-CM

## 2014-12-27 DIAGNOSIS — I6350 Cerebral infarction due to unspecified occlusion or stenosis of unspecified cerebral artery: Secondary | ICD-10-CM

## 2014-12-27 DIAGNOSIS — Z5181 Encounter for therapeutic drug level monitoring: Secondary | ICD-10-CM | POA: Diagnosis not present

## 2014-12-27 DIAGNOSIS — Z7901 Long term (current) use of anticoagulants: Secondary | ICD-10-CM | POA: Diagnosis not present

## 2014-12-27 DIAGNOSIS — I480 Paroxysmal atrial fibrillation: Secondary | ICD-10-CM | POA: Diagnosis not present

## 2014-12-27 DIAGNOSIS — I639 Cerebral infarction, unspecified: Secondary | ICD-10-CM | POA: Diagnosis not present

## 2014-12-27 DIAGNOSIS — R0600 Dyspnea, unspecified: Secondary | ICD-10-CM | POA: Diagnosis not present

## 2014-12-27 DIAGNOSIS — I129 Hypertensive chronic kidney disease with stage 1 through stage 4 chronic kidney disease, or unspecified chronic kidney disease: Secondary | ICD-10-CM | POA: Diagnosis not present

## 2014-12-27 DIAGNOSIS — S81809D Unspecified open wound, unspecified lower leg, subsequent encounter: Secondary | ICD-10-CM

## 2014-12-27 DIAGNOSIS — E785 Hyperlipidemia, unspecified: Secondary | ICD-10-CM

## 2014-12-27 DIAGNOSIS — I429 Cardiomyopathy, unspecified: Secondary | ICD-10-CM

## 2014-12-27 DIAGNOSIS — I428 Other cardiomyopathies: Secondary | ICD-10-CM

## 2014-12-27 LAB — BRAIN NATRIURETIC PEPTIDE: Pro B Natriuretic peptide (BNP): 451 pg/mL — ABNORMAL HIGH (ref 0.0–100.0)

## 2014-12-27 LAB — BASIC METABOLIC PANEL
BUN: 25 mg/dL — ABNORMAL HIGH (ref 6–23)
CALCIUM: 9 mg/dL (ref 8.4–10.5)
CO2: 25 mEq/L (ref 19–32)
CREATININE: 3 mg/dL — AB (ref 0.4–1.5)
Chloride: 118 mEq/L — ABNORMAL HIGH (ref 96–112)
GFR: 25.93 mL/min — AB (ref >60.00)
GLUCOSE: 110 mg/dL — AB (ref 70–99)
Potassium: 5 mEq/L (ref 3.5–5.1)
Sodium: 151 mEq/L — ABNORMAL HIGH (ref 135–145)

## 2014-12-27 LAB — POCT INR: INR: 4.5

## 2014-12-27 NOTE — Patient Instructions (Signed)
Your physician has requested that you have a lexiscan myoview. For further information please visit [email or web address] Please follow instruction sheet, as given.  Your physician recommends that you return for lab work in: TODAY BMET, BNP  Your physician recommends that you continue on your current medications as directed. Please refer to the Current Medication list given to you today.  Your physician recommends that you schedule a follow-up appointment in: 2-3 WEEKS WITH SCOTT WEAVER, PAC  TRY DRINKING GLUCERNA WHEN YOU ARE NOT EATING WELL

## 2014-12-27 NOTE — Progress Notes (Signed)
Cardiology Office Note   Date:  12/27/2014   ID:  Davinder, Haff Jan 24, 1941, MRN [id number]  PCP:  [name], MD  Cardiologist:  Dr. Rollene Rotunda     History of Present Illness: Frank Mclean is a 74 y.o. male with a hx of chronic combined systolic and diastolic CHF, NICM (EF 25-30%), nonobstructive CAD (cath 2012), AFib, CAD, HTN, HL, PAD, LE venous ulcers (followed by wound clinic), hypothyroidism in the setting of amiodarone use, CKD.  Admitted 05/2013 for AFib with RVR complicated by elevated troponin/demand ischemia and a/c combined systolic and diastolic CHF. He was placed on amiodarone at that time with restoration of NSR.   Admitted 11/2013 with acute respiratory failure secondary to community acquired pneumonia complicated by decompensated heart failure.   He has had significant problems with profound hypothyroidism.  Recent Labs  09/16/14 1114 10/28/14 1103 12/23/14 1126  TSH 82.00 Repeated and verified X2.* 105.79 Repeated and verified X2.* 6.04*  Last seen by Dr. Antoine Poche 11/25/14. Patient complained of weakness and poor appetite. Given his problems with hypothyroidism, amiodarone was stopped. Most recent TSH is improved at 6.04.  He returns for follow-up.   He has noted increased DOE over the past several weeks.  He is NYHA 3.  He denies chest pain.  He sleeps on 3 pillows.  He denies PND.  He denies increasing abdominal girth or LE edema.  He denies syncope.  No significant cough.  He does note wheezing at night at times.  He is not eating well.  He has not had to take any Metolazone due to weight gain.  Weight is down.    Recent Labs: 06/24/2014: LDL (calc) 186* 08/12/2014: ALT 21 09/12/2014: Hemoglobin 12.4* 09/16/2014: BUN 28*; Creatinine 3.2*; Potassium 3.8; Pro B Natriuretic peptide (BNP) 74.0; Sodium 146* 12/23/2014: TSH 6.04*   Estimated Creatinine Clearance: 23.8 mL/min (by C-G formula based on Cr of 3.2).    Recent Radiology: No results found.     Wt Readings from Last 3 Encounters:  12/27/14 222 lb (100.699 kg)  12/26/14 226 lb 8 oz (102.74 kg)  11/25/14 237 lb (107.502 kg)     Past Medical History  Diagnosis Date  . Gout   . HTN (hypertension)   . Hyperlipidemia   . Osteoarthritis   . History of CVA (cerebrovascular accident)     Left pontine infarct July 2004; changed from Plavix to Coumadin in 2004 per MD at [facility]  . GERD (gastroesophageal reflux disease)   . DJD (degenerative joint disease)   . BPH (benign prostatic hypertrophy)   . DM (diabetes mellitus), type 2   . Peripheral vascular disease   . Bilateral leg ulcer     ACHILLES AREA--  NONHEALING  . CKD (chronic kidney disease) stage 3, GFR 30-59 ml/min   . CAD (coronary artery disease)     a. LHC 4/12: Mid LAD 25%, mid diagonal 30%, AV circumflex 40%, proximal OM 25%, distal RCA 40%  . Chronic combined systolic and diastolic heart failure     a. Echo 05/2013: EF 25-30%, diffuse HK, restrictive physiology, trivial AI, mild MR, moderate LAE, reduced RV systolic function, PASP 42  . LBBB (left bundle branch block)   . Atrial fibrillation     a. amiodarone Rx started 05/2013;  b. chronic coumadin  . NICM (nonischemic cardiomyopathy)     a. EF 25-30%.    Current Outpatient Prescriptions  Medication Sig Dispense Refill  . amLODipine (NORVASC) 10 MG tablet Take  10 mg by mouth daily.    . carvedilol (COREG) 6.25 MG tablet TAKE 1 TABLET (6.25 MG TOTAL) BY MOUTH 2 (TWO) TIMES DAILY WITH A MEAL. 60 tablet 5  . cholecalciferol (VITAMIN D) 1000 UNITS tablet Take 1,000 Units by mouth daily.    . CVS VITAMIN B12 1000 MCG tablet TAKE 1 TABLET BY MOUTH DAILY 100 tablet 3  . ferrous sulfate 325 (65 FE) MG tablet TAKE 1 TABLET BY MOUTH EVERY DAY 30 tablet 6  . glipiZIDE (GLUCOTROL) 5 MG tablet TAKE 1/2 TABLET BY MOUTH TWICE A DAY BEFORE A MEAL 30 tablet 3  . hydrALAZINE (APRESOLINE) 50 MG tablet 1 and 1/2 tablet three times a day 135 tablet 3  . HYDROcodone-acetaminophen  (NORCO/VICODIN) 5-325 MG per tablet Take 1 tablet by mouth 2 (two) times daily as needed. 60 tablet 0  . isosorbide mononitrate (IMDUR) 60 MG 24 hr tablet TAKE 1 TABLET (60 MG TOTAL) BY MOUTH DAILY. 30 tablet 5  . levothyroxine (SYNTHROID, LEVOTHROID) 100 MCG tablet Take 2 tablets (200 mcg total) by mouth daily before breakfast. 60 tablet 1  . metolazone (ZAROXOLYN) 5 MG tablet Take 5 mg by mouth as needed.    . potassium chloride SA (KLOR-CON M20) 20 MEQ tablet Take 3 tabs TWICE DAILY    . SYMBICORT 160-4.5 MCG/ACT inhaler INHALE 2 PUFFS INTO THE LUNGS 2 TIMES DAILY 10.2 g 5  . torsemide (DEMADEX) 20 MG tablet 20 MG DAILY    . triamcinolone cream (KENALOG) 0.5 % Apply 1 application topically 2 (two) times daily as needed (for skin breakdown). 30 g 0  . warfarin (COUMADIN) 5 MG tablet TAKE 1 TABLET (5 MG TOTAL) BY MOUTH AS DIRECTED. 40 tablet 2   No current facility-administered medications for this visit.     Allergies:   Review of patient's allergies indicates no known allergies.   Social History:  The patient  reports that he quit smoking about 40 years ago. He has never used smokeless tobacco. He reports that he does not drink alcohol or use illicit drugs.   Family History:  [name] family history includes Diabetes in his mother; Gout in his other; Heart attack in his maternal grandmother; Heart disease in his father and mother; Hypertension in his father and mother; Stroke in his other. There is no history of Thyroid disease.    ROS:  Please see the history of present illness.       All other systems reviewed and negative.    PHYSICAL EXAM: VS:  BP 120/84 mmHg  Pulse 51  Ht  (1.905 m)  Wt 222 lb (100.699 kg)  BMI 27.75 kg/m2  SpO2 96% Well nourished, well developed, in no acute distress HEENT: normal Neck:  no JVD Cardiac:  normal S1, S2;  RRR;  no murmur   Lungs:  Decreased breath sounds bilaterally, no wheezing, rhonchi or rales Abd: soft, nontender, no  hepatomegaly Ext: trace bilateral LE edema Skin: warm and dry Neuro:  CNs 2-12 intact, no focal abnormalities noted  EKG:  Sinus brady, HR 51, LBBB       ASSESSMENT AND PLAN:  1.  Dyspnea:  Etiology not clear.  He does not look volume overloaded.  No infectious symptoms.  TSH is improved.  Lungs are clear.  I suspect this is all related to poor nutrition and weakness.  He is a diabetic and his LHC was almost 4 years ago.  Question if his symptoms could be an anginal equivalent.    -  BMET, BNP today.    -  Lexiscan Myoview.  With his CKD, I would only be looking for high risk features as he is high risk for CIN.  Otherwise, we would try to maximize med Rx.  If BNP is high and diuretics are adjusted, I will DC his nuclear study. 2.  Chronic Combined Systolic and Diastolic CHF:  Volume appears stable. Check BNP. 3.  Hypothyroidism:  Managed by PCP.  Now off Amio. 3.  Nonischemic Cardiomyopathy:  Continue beta blocker, nitrates, hydralazine.   4.  CAD:  Obtain Myoview as noted.  5.  Atrial Fibrillation:  Maintaining NSR.  Continue Coumadin.  He is now off Amio.  6.  CKD:  Check BMET today.  He has seen Neph.  He has declined AVF placement for now.  7.  Hypertension:  Controlled.   8.  Hyperlipidemia:  He is off of Lipitor for unclear reasons.  If myoview abnormal, will need to consider resuming.  9.  PVD with LE Ulcers:  These are healed.   Disposition:   FU with Dr. Rollene Rotunda or me in 2-3 weeks.    Signed, Brynda Rim, MHS 12/27/2014 8:52 AM    [facility] [address] [place], [place], [place]  [address] Phone: [phone]; Fax: [phone]

## 2014-12-27 NOTE — Telephone Encounter (Signed)
emmi mailed  °

## 2014-12-27 NOTE — Telephone Encounter (Signed)
DPR on file, wife notified of lab results and to have pt take metolazone x 1 tomorrow AM 30 min before AM demadex. BMET, BNP 1/12. Wife verbalized understanding to Plan of Care.

## 2014-12-31 ENCOUNTER — Other Ambulatory Visit (INDEPENDENT_AMBULATORY_CARE_PROVIDER_SITE_OTHER): Payer: Medicare Other | Admitting: *Deleted

## 2014-12-31 DIAGNOSIS — R0602 Shortness of breath: Secondary | ICD-10-CM

## 2014-12-31 DIAGNOSIS — I5042 Chronic combined systolic (congestive) and diastolic (congestive) heart failure: Secondary | ICD-10-CM

## 2014-12-31 DIAGNOSIS — I10 Essential (primary) hypertension: Secondary | ICD-10-CM

## 2014-12-31 LAB — BASIC METABOLIC PANEL
BUN: 31 mg/dL — ABNORMAL HIGH (ref 6–23)
CHLORIDE: 106 meq/L (ref 96–112)
CO2: 30 meq/L (ref 19–32)
CREATININE: 3.3 mg/dL — AB (ref 0.4–1.5)
Calcium: 9 mg/dL (ref 8.4–10.5)
GFR: 23.83 mL/min — ABNORMAL LOW (ref >60.00)
GLUCOSE: 128 mg/dL — AB (ref 70–99)
Potassium: 3.4 mEq/L — ABNORMAL LOW (ref 3.5–5.1)
Sodium: 143 mEq/L (ref 135–145)

## 2014-12-31 LAB — BRAIN NATRIURETIC PEPTIDE: PRO B NATRI PEPTIDE: 245 pg/mL — AB (ref 0.0–100.0)

## 2015-01-02 ENCOUNTER — Telehealth: Payer: Self-pay

## 2015-01-02 DIAGNOSIS — E876 Hypokalemia: Secondary | ICD-10-CM

## 2015-01-02 NOTE — Telephone Encounter (Signed)
Informed patient he will be getting labs done in the morning before his stress test. Patient verbalized understanding.

## 2015-01-03 ENCOUNTER — Ambulatory Visit (HOSPITAL_COMMUNITY): Payer: Medicare Other | Attending: Physician Assistant | Admitting: Radiology

## 2015-01-03 ENCOUNTER — Other Ambulatory Visit (INDEPENDENT_AMBULATORY_CARE_PROVIDER_SITE_OTHER): Payer: Medicare Other | Admitting: *Deleted

## 2015-01-03 DIAGNOSIS — E876 Hypokalemia: Secondary | ICD-10-CM | POA: Diagnosis not present

## 2015-01-03 DIAGNOSIS — E119 Type 2 diabetes mellitus without complications: Secondary | ICD-10-CM | POA: Diagnosis not present

## 2015-01-03 DIAGNOSIS — I447 Left bundle-branch block, unspecified: Secondary | ICD-10-CM | POA: Diagnosis not present

## 2015-01-03 DIAGNOSIS — Z87891 Personal history of nicotine dependence: Secondary | ICD-10-CM | POA: Diagnosis not present

## 2015-01-03 DIAGNOSIS — I2510 Atherosclerotic heart disease of native coronary artery without angina pectoris: Secondary | ICD-10-CM | POA: Insufficient documentation

## 2015-01-03 DIAGNOSIS — I10 Essential (primary) hypertension: Secondary | ICD-10-CM | POA: Diagnosis not present

## 2015-01-03 DIAGNOSIS — I480 Paroxysmal atrial fibrillation: Secondary | ICD-10-CM | POA: Diagnosis not present

## 2015-01-03 DIAGNOSIS — R0602 Shortness of breath: Secondary | ICD-10-CM | POA: Diagnosis not present

## 2015-01-03 DIAGNOSIS — R0609 Other forms of dyspnea: Secondary | ICD-10-CM | POA: Insufficient documentation

## 2015-01-03 LAB — BASIC METABOLIC PANEL
BUN: 34 mg/dL — AB (ref 6–23)
CALCIUM: 9.5 mg/dL (ref 8.4–10.5)
CO2: 30 mEq/L (ref 19–32)
CREATININE: 2.91 mg/dL — AB (ref 0.40–1.50)
Chloride: 113 mEq/L — ABNORMAL HIGH (ref 96–112)
GFR: 27.27 mL/min — AB (ref >60.00)
Glucose, Bld: 95 mg/dL (ref 70–99)
Potassium: 4.4 mEq/L (ref 3.5–5.1)
SODIUM: 150 meq/L — AB (ref 135–145)

## 2015-01-03 MED ORDER — TECHNETIUM TC 99M SESTAMIBI GENERIC - CARDIOLITE
33.0000 | Freq: Once | INTRAVENOUS | Status: AC | PRN
  Administered 2015-01-03: 33 via INTRAVENOUS

## 2015-01-03 MED ORDER — TECHNETIUM TC 99M SESTAMIBI GENERIC - CARDIOLITE
11.0000 | Freq: Once | INTRAVENOUS | Status: AC | PRN
  Administered 2015-01-03: 11 via INTRAVENOUS

## 2015-01-03 MED ORDER — ADENOSINE (DIAGNOSTIC) 3 MG/ML IV SOLN
0.5600 mg/kg | Freq: Once | INTRAVENOUS | Status: AC
  Administered 2015-01-03: 56.1 mg via INTRAVENOUS

## 2015-01-03 NOTE — Progress Notes (Signed)
[facility] SITE 3 NUCLEAR MED [address] [place], [place] [address] [phone]    Cardiology Nuclear Med Study  Frank Mclean is a 74 y.o. male     MRN : [id number]     DOB: 1941-11-30  Procedure Date: 01/03/2015  Nuclear Med Background Indication for Stress Test:  Evaluation for Ischemia History:  A-Fib, LBBB,  Cardiac Risk Factors: Hypertension, LBBB, NIDDM and CAD  Symptoms:  DOE   Nuclear Pre-Procedure Caffeine/Decaff Intake:  None NPO After: 7:00pm   Lungs:  clear O2 Sat: 98% on room air. IV 0.9% NS with Angio Cath:  22g  IV Site: R Hand  IV Started by:  Bonnita Levan, RN  Chest Size (in):  48 Cup Size: n/a  Height: 6\' 3"  (1.905 m)  Weight:  221 lb (100.245 kg)  BMI:  Body mass index is 27.62 kg/(m^2). Tech Comments:  NA    Nuclear Med Study 1 or 2 day study: 1 day  Stress Test Type:  Adenosine  Reading MD: Olga Millers, MD  Order Authorizing Provider:  J. Hochrein, MD  Resting Radionuclide: Technetium 74m Sestamibi  Resting Radionuclide Dose: 11.0 mCi   Stress Radionuclide:  Technetium 13m Sestamibi  Stress Radionuclide Dose: 33.0 mCi           Stress Protocol Rest HR: 61 Stress HR: 61  Rest BP: 164/101 Stress BP: 126/70  Exercise Time (min): n/a METS: n/a   Predicted Max HR: 145 bpm % Max HR: 43.45 bpm Rate Pressure Product: 09381   Dose of Adenosine (mg):  n/a Dose of Lexiscan: 0.4 mg  Dose of Atropine (mg): n/a Dose of Dobutamine: n/a mcg/kg/min (at max HR)  Stress Test Technologist: Nelson Chimes, BS-ES  Nuclear Technologist:  Kerby Nora, CNMT     Rest Procedure:  Myocardial perfusion imaging was performed at rest 45 minutes following the intravenous administration of Technetium 68m Sestamibi. Rest ECG: NSR-LBBB  Stress Procedure:  The patient received IV adenosine at 140 mcg/kg/min for 4 minutes.  Technetium 43m Sestamibi was injected at the 2 minute mark and quantitative spect images were obtained after a 45 minute delay.  During  the infusion of Adenosine the patient complained of stomach upset.  This resolved in recovery.  Stress ECG: Uninteretable due to baseline LBBB  QPS Raw Data Images:  Acquisition technically good; LVE. Stress Images:  There is decreased uptake in the inferior wall and apex. Rest Images:  There is decreased uptake in the inferior wall and apex; apical defect less prominent compared to the stress images. Subtraction (SDS):  These findings are consistent with prior infarct and mild apical ischemia. Transient Ischemic Dilatation (Normal <1.22):  1.04 Lung/Heart Ratio (Normal <0.45):  0.37  Quantitative Gated Spect Images QGS EDV:  260 ml QGS ESV:  195 ml  Impression Exercise Capacity:  Adenosine study with no exercise. BP Response:  Hypertensive blood pressure response. Clinical Symptoms:  No chest pain or dyspnea. ECG Impression:  Baseline:  LBBB.  EKG uninterpretable due to LBBB at rest and stress. Comparison with Prior Nuclear Study: No previous nuclear study performed  Overall Impression:  High risk stress nuclear study with a large, severe, partially reversible inferior and apical defect consistent with prior inferior and apical infarct; mild apical ischemia; severe LVE; study high risk due to reduced LV function.  LV Ejection Fraction: 25%.  LV Wall Motion:  Global hypokinesis.   Olga Millers

## 2015-01-06 ENCOUNTER — Ambulatory Visit (INDEPENDENT_AMBULATORY_CARE_PROVIDER_SITE_OTHER): Payer: Medicare Other

## 2015-01-06 DIAGNOSIS — Z5181 Encounter for therapeutic drug level monitoring: Secondary | ICD-10-CM

## 2015-01-06 DIAGNOSIS — I639 Cerebral infarction, unspecified: Secondary | ICD-10-CM

## 2015-01-06 DIAGNOSIS — I6350 Cerebral infarction due to unspecified occlusion or stenosis of unspecified cerebral artery: Secondary | ICD-10-CM

## 2015-01-06 DIAGNOSIS — Z7901 Long term (current) use of anticoagulants: Secondary | ICD-10-CM

## 2015-01-06 LAB — POCT INR: INR: 2.3

## 2015-01-08 ENCOUNTER — Other Ambulatory Visit: Payer: Self-pay | Admitting: Internal Medicine

## 2015-01-16 ENCOUNTER — Ambulatory Visit (INDEPENDENT_AMBULATORY_CARE_PROVIDER_SITE_OTHER): Payer: Medicare Other | Admitting: *Deleted

## 2015-01-16 ENCOUNTER — Encounter: Payer: Self-pay | Admitting: Physician Assistant

## 2015-01-16 ENCOUNTER — Ambulatory Visit (INDEPENDENT_AMBULATORY_CARE_PROVIDER_SITE_OTHER): Payer: Medicare Other | Admitting: Physician Assistant

## 2015-01-16 ENCOUNTER — Other Ambulatory Visit: Payer: Self-pay | Admitting: Internal Medicine

## 2015-01-16 VITALS — BP 120/70 | HR 54 | Ht 75.0 in | Wt 224.0 lb

## 2015-01-16 DIAGNOSIS — Z7901 Long term (current) use of anticoagulants: Secondary | ICD-10-CM

## 2015-01-16 DIAGNOSIS — I480 Paroxysmal atrial fibrillation: Secondary | ICD-10-CM

## 2015-01-16 DIAGNOSIS — I429 Cardiomyopathy, unspecified: Secondary | ICD-10-CM | POA: Diagnosis not present

## 2015-01-16 DIAGNOSIS — I2510 Atherosclerotic heart disease of native coronary artery without angina pectoris: Secondary | ICD-10-CM | POA: Diagnosis not present

## 2015-01-16 DIAGNOSIS — I639 Cerebral infarction, unspecified: Secondary | ICD-10-CM

## 2015-01-16 DIAGNOSIS — S81809D Unspecified open wound, unspecified lower leg, subsequent encounter: Secondary | ICD-10-CM

## 2015-01-16 DIAGNOSIS — N183 Chronic kidney disease, stage 3 unspecified: Secondary | ICD-10-CM

## 2015-01-16 DIAGNOSIS — I10 Essential (primary) hypertension: Secondary | ICD-10-CM | POA: Diagnosis not present

## 2015-01-16 DIAGNOSIS — Z5181 Encounter for therapeutic drug level monitoring: Secondary | ICD-10-CM

## 2015-01-16 DIAGNOSIS — E785 Hyperlipidemia, unspecified: Secondary | ICD-10-CM | POA: Diagnosis not present

## 2015-01-16 DIAGNOSIS — I428 Other cardiomyopathies: Secondary | ICD-10-CM

## 2015-01-16 DIAGNOSIS — I5042 Chronic combined systolic (congestive) and diastolic (congestive) heart failure: Secondary | ICD-10-CM

## 2015-01-16 DIAGNOSIS — I6350 Cerebral infarction due to unspecified occlusion or stenosis of unspecified cerebral artery: Secondary | ICD-10-CM

## 2015-01-16 LAB — POCT INR: INR: 1.1

## 2015-01-16 MED ORDER — ATORVASTATIN CALCIUM 20 MG PO TABS: 20.0000 mg | ORAL_TABLET | Freq: Every day | ORAL | Status: DC

## 2015-01-16 NOTE — Progress Notes (Signed)
Cardiology Office Note   Date:  01/16/2015   ID:  Frank, Mclean 10-11-1941, MRN [id number]  PCP:  [name], MD  Cardiologist:  Dr. Rollene Rotunda    Chief Complaint  Patient presents with  . Shortness of Breath  . Congestive Heart Failure     History of Present Illness: Frank Mclean is a 74 y.o. male who presents for FU on the above.  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  Admitted 05/2013 for AFib with RVR complicated by elevated troponin/demand ischemia and a/c combined systolic and diastolic CHF. He was placed on amiodarone at that time with restoration of NSR.   Admitted 11/2013 with acute respiratory failure secondary to community acquired pneumonia complicated by decompensated heart failure.   He has recently had significant problems with profound hypothyroidism.  Amiodarone was DC'd and his TSH has markedly improved.   I saw him in 1/8.  He complained of increased DOE.  He did not look volume overloaded.  Nuclear stress test was ordered and this demonstrated inf and apical defect c/w prior inf and apical infarct with mild apical ischemia, EF 25%.  Nuclear study was called high risk due to reduced EF.  I had Dr. Antoine Poche review his study.  He did not feel the study was high risk enough to warrant a cardiac cath (he is high risk for CIN with CKD).  I increased his nitrates.  He returns for FU.    His breathing is slowly improving.  He is probably NYHA 2b-3 now.  Sleeps on 2 pillows.  No PND.  Edema is stable.  No chest pain.  No syncope.   Studies/Reports Reviewed Today:   - Nuclear Stress Test (1/16): High risk stress nuclear study with a large, severe, partially reversible inferior and apical defect consistent with prior inferior and apical infarct; mild apical ischemia;  severe LVE; study high risk due to reduced LV function.  EF 25%    Past Medical History  Diagnosis Date  . Gout   . HTN (hypertension)   . Hyperlipidemia   . Osteoarthritis   . History of CVA (cerebrovascular accident)     Left pontine infarct July 2004; changed from Plavix to Coumadin in 2004 per MD at [facility]  . GERD (gastroesophageal reflux disease)   . DJD (degenerative joint disease)   . BPH (benign prostatic hypertrophy)   . DM (diabetes mellitus), type 2   . Peripheral vascular disease   . Bilateral leg ulcer     ACHILLES AREA--  NONHEALING  . CKD (chronic kidney disease) stage 3, GFR 30-59 ml/min   . CAD (coronary artery disease)     a. LHC 4/12: Mid LAD 25%, mid diagonal 30%, AV circumflex 40%, proximal OM 25%, distal RCA 40%  . Chronic combined systolic and diastolic heart failure     a. Echo 05/2013: EF 25-30%, diffuse HK, restrictive physiology, trivial AI, mild MR, moderate LAE, reduced RV systolic function, PASP 42  . LBBB (left bundle branch block)   . Atrial fibrillation     a. amiodarone Rx started 05/2013;  b. chronic coumadin  . NICM (nonischemic cardiomyopathy)     a. EF 25-30%.  [name] Hx of cardiovascular stress test     Nuclear Stress Test (1/16): High risk stress nuclear study with a  large, severe, partially reversible inferior and apical defect consistent with prior inferior and apical infarct; mild apical ischemia; severe LVE; study high risk due to reduced LV function.  EF 25% >> reviewed with Dr. Antoine Poche >> medical mgmt    Past Surgical History  Procedure Laterality Date  . Cardiac catheterization  04-16-2011   DR [facility]    NON-OBSTRUCTIVE CAD. MILDLY ELEVATED PULMONARY PRESSURES/ ELEVATED  END-DIASTOLIC PRESSURE  . Transthoracic echocardiogram  12-31-2010    MODERATE CONCENTRIC LVH/ SYSTOLIC FUNCTION SEVERELY REDUCED/ EF 25-30%/  SEVERE HYPOKINESIS OF ANTEROSEPTAL MYOCARDIUM  AND ENTIREAPICAL MYOCARDIUM /  MODERATE HYPOKINESIS OF LATERAL, INFEROLATERAL,  INFERIOR,AND INFEROSEPTAL MYOCARIUM/  GRADE 3 DIASTOLIC DYSFUNCTION/ MILD MR  . Aortogram w/ bilateral lower extremitiy runoff  05-18-2013  DR FIELDS    LEFT LEG OCCLUDED PERONEAL AND ANTERIOR TIBIAL ARTERIES/ HIGH GRADE STENOIS 80% MIDDLE AND DISTAL THIRD OF POSTERIOR TIBIAL ARTERY/ RIGHT PERONEAL AND ANTERIOR TIBIAL ARTERY OCCLUDED/ 40% STENOSIS DISTALLY   . Abdominal aortagram N/A 05/18/2013    Procedure: ABDOMINAL Ronny Flurry;  Surgeon: Sherren Kerns, MD;  Location: [facility] C[facility] LAB;  Service: Cardiovascular;  Laterality: N/A;  . Lower extremity angiogram Bilateral 05/18/2013    Procedure: LOWER EXTREMITY ANGIOGRAM;  Surgeon: Sherren Kerns, MD;  Location: [facility] CATH LAB;  Service: Cardiovascular;  Laterality: Bilateral;     Current Outpatient Prescriptions  Medication Sig Dispense Refill  . amLODipine (NORVASC) 10 MG tablet Take 10 mg by mouth daily.    . carvedilol (COREG) 6.25 MG tablet TAKE 1 TABLET (6.25 MG TOTAL) BY MOUTH 2 (TWO) TIMES DAILY WITH A MEAL. 60 tablet 5  . cholecalciferol (VITAMIN D) 1000 UNITS tablet Take 1,000 Units by mouth daily.    . CVS VITAMIN B12 1000 MCG tablet TAKE 1 TABLET BY MOUTH DAILY 100 tablet 3  . ferrous sulfate 325 (65 FE) MG tablet TAKE 1 TABLET BY MOUTH EVERY DAY 30 tablet 6  . glipiZIDE (GLUCOTROL) 5 MG tablet TAKE 1/2 TABLET BY MOUTH TWICE A DAY BEFORE A MEAL 30 tablet 3  . hydrALAZINE (APRESOLINE) 50 MG tablet 1 and 1/2 tablet three times a day 135 tablet 3  . HYDROcodone-acetaminophen (NORCO/VICODIN) 5-325 MG per tablet Take 1 tablet by mouth 2 (two) times daily as needed. 60 tablet 0  . isosorbide mononitrate (IMDUR) 60 MG 24 hr tablet TAKE 1 TABLET (60 MG TOTAL) BY MOUTH DAILY. 30 tablet 5  . levothyroxine (SYNTHROID, LEVOTHROID) 100 MCG tablet Take 2 tablets (200 mcg total) by mouth daily before breakfast. 60 tablet 1  . metolazone (ZAROXOLYN) 5 MG tablet Take 5 mg by mouth as needed.    . potassium chloride SA (KLOR-CON M20) 20 MEQ tablet Take 3  tabs TWICE DAILY    . SYMBICORT 160-4.5 MCG/ACT inhaler INHALE 2 PUFFS INTO THE LUNGS 2 TIMES DAILY 10.2 g 5  . torsemide (DEMADEX) 20 MG tablet 20 MG DAILY    . triamcinolone cream (KENALOG) 0.5 % Apply 1 application topically 2 (two) times daily as needed (for skin breakdown). 30 g 0  . warfarin (COUMADIN) 5 MG tablet TAKE 1 TABLET (5 MG TOTAL) BY MOUTH AS DIRECTED. 40 tablet 2   No current facility-administered medications for this visit.    Allergies:   Review of patient's allergies indicates no known allergies.    Social History:  The patient  reports that he quit smoking about 40 years ago. He has never used smokeless tobacco. He reports that he does not drink alcohol or use illicit drugs.  Family History:  [name] family history includes Diabetes in his mother; Gout in his other; Heart attack in his maternal grandmother; Heart disease in his father and mother; Hypertension in his father and mother; Stroke in his other. There is no history of Thyroid disease.    ROS:  Please see the history of present illness.   Otherwise, review of systems are positive for none.   All other systems are reviewed and negative.    PHYSICAL EXAM: VS:  BP 120/70 mmHg  Pulse 54  Ht 6\' 3"  (1.905 m)  Wt 224 lb (101.606 kg)  BMI 28.00 kg/m2    Wt Readings from Last 3 Encounters:  01/16/15 224 lb (101.606 kg)  01/03/15 221 lb (100.245 kg)  12/27/14 222 lb (100.699 kg)     GEN: Well nourished, well developed, in no acute distress HEENT: normal Neck: no JVD, no masses Cardiac:  Normal S1/S2, RRR; no murmur, no rubs or gallops, trace ankle edema  Respiratory:  Decreased breath sounds bilaterally, no wheezing, rhonchi or rales. GI: soft, nontender, nondistended, + BS MS: no deformity or atrophy Skin: warm and dry  Neuro:  CNs II-XII intact, Strength and sensation are intact Psych: Normal affect   EKG:  EKG is ordered today.  It demonstrates:   Sinus brady, HR 54, LBBB   Recent  Labs: 06/24/2014: Magnesium 2.0 08/12/2014: ALT 21 09/12/2014: Hemoglobin 12.4*; Platelets 187.0 12/23/2014: TSH 6.04* 12/31/2014: Pro B Natriuretic peptide (BNP) 245.0* 01/03/2015: BUN 34*; Creatinine 2.91*; Potassium 4.4; Sodium 150*    Lipid Panel    Component Value Date/Time   CHOL 269* 06/24/2014 1139   TRIG 133.0 06/24/2014 1139   TRIG 89 12/21/2006 1117   HDL 56.50 06/24/2014 1139   CHOLHDL 5 06/24/2014 1139   VLDL 26.6 06/24/2014 1139   LDLCALC 186* 06/24/2014 1139   LDLDIRECT 177.9 06/17/2010 0909      ASSESSMENT AND PLAN:  1.  Dyspnea: I still suspect he was deconditioned from poor appetite in the setting of profound hypothyroidism.  This is corrected now that he is off his Amiodarone.  He notes improved breathing.  This may be a result of the increased dose of nitrates.  Or it may be related to improved appetite and normal thyroid function.  I considered Amiodarone toxicity.  However, he is off this medication.  I also considered low output symptoms.  Continue current Rx for now. 2. Chronic Combined Systolic and Diastolic CHF: Volume appears stable. Continue current dose of diuretic. Recent renal function stable. 3. Hypothyroidism: Managed by PCP. Now off Amio. 3. Nonischemic Cardiomyopathy: Continue beta blocker, nitrates, hydralazine. I will discuss with Dr. Antoine Poche whether or not we should consider referral to EP for consideration of by BiV-ICD. 4. CAD: He has a history of nonobstructive disease by prior cardiac catheterization. Recent Myoview was called high risk but this was mainly for his reduced ejection fraction. He denies chest discomfort. His breathing is improved. We will continue medical therapy as he is high risk for contrast-induced nephropathy in the setting of chronic kidney disease. Continue beta blocker, nitrates, amlodipine. He is not on aspirin as he is on Coumadin. 5. Atrial Fibrillation: Maintaining NSR. Continue Coumadin. He is now off Amio.   6. CKD: Follow-up with nephrology as planned. He has declined AVF placement for now.  7. Hypertension: Controlled.  8. Hyperlipidemia: Resume Lipitor 20 mg daily. Check lipids and LFTs in 6-8 weeks.  9. PVD with LE Ulcers: These are healed.  Current medicines are reviewed  at length with the patient today.  The patient does not have concerns regarding medicines.  The following changes have been made:  As above.  Labs/ tests ordered today include:   Orders Placed This Encounter  Procedures  . Lipid Profile  . Hepatic function panel  . EKG 12-Lead     Disposition:   FU with me in 2 months and Dr. Antoine Poche in 4 months.   Signed, Brynda Rim, MHS 01/16/2015 11:26 AM    [facility] [address] [place], [place], [place]  [address] Phone: [phone]; Fax: [phone]

## 2015-01-16 NOTE — Patient Instructions (Signed)
START LIPITOR 20 MG 1 TABLET DAILY; RX SENT IN   FOLLOW UP WITH SCOTT WEAVER, PAC IN 2 MONTHS  FOLLOW UP WITH DR. HOCHREIN IN 4 MONTHS AT THE [facility]  FASTING LIPID AND LIVER PANEL TO BE DONE IN 6-8 WEEKS

## 2015-01-23 ENCOUNTER — Encounter: Payer: Self-pay | Admitting: Physician Assistant

## 2015-01-23 ENCOUNTER — Ambulatory Visit (INDEPENDENT_AMBULATORY_CARE_PROVIDER_SITE_OTHER): Payer: Medicare Other | Admitting: Physician Assistant

## 2015-01-23 ENCOUNTER — Ambulatory Visit (INDEPENDENT_AMBULATORY_CARE_PROVIDER_SITE_OTHER): Payer: Medicare Other | Admitting: *Deleted

## 2015-01-23 VITALS — BP 154/86 | HR 67 | Wt 224.0 lb

## 2015-01-23 DIAGNOSIS — I639 Cerebral infarction, unspecified: Secondary | ICD-10-CM

## 2015-01-23 DIAGNOSIS — R0602 Shortness of breath: Secondary | ICD-10-CM

## 2015-01-23 DIAGNOSIS — Z5181 Encounter for therapeutic drug level monitoring: Secondary | ICD-10-CM

## 2015-01-23 DIAGNOSIS — I6350 Cerebral infarction due to unspecified occlusion or stenosis of unspecified cerebral artery: Secondary | ICD-10-CM

## 2015-01-23 DIAGNOSIS — I4891 Unspecified atrial fibrillation: Secondary | ICD-10-CM | POA: Diagnosis not present

## 2015-01-23 DIAGNOSIS — Z7901 Long term (current) use of anticoagulants: Secondary | ICD-10-CM | POA: Diagnosis not present

## 2015-01-23 DIAGNOSIS — I5042 Chronic combined systolic (congestive) and diastolic (congestive) heart failure: Secondary | ICD-10-CM | POA: Diagnosis not present

## 2015-01-23 LAB — CBC WITH DIFFERENTIAL/PLATELET
Basophils Absolute: 0 10*3/uL (ref 0.0–0.1)
Basophils Relative: 0.4 % (ref 0.0–3.0)
Eosinophils Absolute: 0.1 10*3/uL (ref 0.0–0.7)
Eosinophils Relative: 1.1 % (ref 0.0–5.0)
HEMATOCRIT: 34.2 % — AB (ref 39.0–52.0)
HEMOGLOBIN: 10.9 g/dL — AB (ref 13.0–17.0)
LYMPHS ABS: 0.8 10*3/uL (ref 0.7–4.0)
Lymphocytes Relative: 8.3 % — ABNORMAL LOW (ref 12.0–46.0)
MCHC: 31.8 g/dL (ref 30.0–36.0)
MCV: 86.1 fl (ref 78.0–100.0)
MONO ABS: 0.5 10*3/uL (ref 0.1–1.0)
MONOS PCT: 5.1 % (ref 3.0–12.0)
NEUTROS PCT: 85.1 % — AB (ref 43.0–77.0)
Neutro Abs: 8.1 10*3/uL — ABNORMAL HIGH (ref 1.4–7.7)
PLATELETS: 198 10*3/uL (ref 150.0–400.0)
RBC: 3.97 Mil/uL — ABNORMAL LOW (ref 4.22–5.81)
RDW: 16.3 % — ABNORMAL HIGH (ref 11.5–15.5)
WBC: 9.6 10*3/uL (ref 4.0–10.5)

## 2015-01-23 LAB — POCT INR: INR: 2

## 2015-01-23 LAB — BASIC METABOLIC PANEL
BUN: 26 mg/dL — ABNORMAL HIGH (ref 6–23)
CO2: 26 meq/L (ref 19–32)
Calcium: 9.1 mg/dL (ref 8.4–10.5)
Chloride: 114 mEq/L — ABNORMAL HIGH (ref 96–112)
Creatinine, Ser: 2.62 mg/dL — ABNORMAL HIGH (ref 0.40–1.50)
GFR: 30.77 mL/min — ABNORMAL LOW (ref >60.00)
Glucose, Bld: 98 mg/dL (ref 70–99)
Potassium: 4.9 mEq/L (ref 3.5–5.1)
Sodium: 145 mEq/L (ref 135–145)

## 2015-01-23 LAB — BRAIN NATRIURETIC PEPTIDE: Pro B Natriuretic peptide (BNP): 432 pg/mL — ABNORMAL HIGH (ref 0.0–100.0)

## 2015-01-23 NOTE — Patient Instructions (Signed)
Your physician recommends that you continue on your current medications as directed. Please refer to the Current Medication list given to you today.  Your physician recommends that you get labs done today, BMET and BNP

## 2015-01-23 NOTE — Progress Notes (Signed)
Cardiology Office Note   Date:  01/23/2015   ID:  Frank Mclean, Frank Mclean Sep 11, 1941, MRN [id number]  PCP:  [name], MD  Cardiologist:  Dr. Antoine Poche   Dyspnea and weakness. Generally not feeling well.     History of Present Illness: Frank Mclean is a 74 y.o. 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   PMH:  Admitted 05/2013 for AFib with RVR complicated by elevated troponin/demand ischemia and a/c combined systolic and diastolic CHF. He was placed on amiodarone at that time with restoration of NSR.   Admitted 11/2013 with acute respiratory failure secondary to community acquired pneumonia complicated by decompensated heart failure.   He has recently had significant problems with profound hypothyroidism. Amiodarone was DC'd and his TSH has markedly improved.   He was last seen by Tereso Newcomer PA-C on 12/27/14 and felt to be evolemic. Nuclear stress test was reviewed which demonstrated inf and apical defect c/w prior inf and apical infarct with mild apical ischemia, EF 25%. Nuclear study was called high risk due to reduced EF. Dr. Antoine Poche reviewed this study and did not feel the study was high risk enough to warrant a cardiac cath (he is high risk for CIN with CKD). His nitrated were increased.    Today he was at a coumadin clinic appointment when he complained of weakness and worsening dyspnea and was added on to my schedule.No chest pain, dizziness or syncope. He does have chronic 2 pillow orthopnea, he has lately needed to use home 02 at night and noticing some PND. He also has felt very weak. When getting up from his chair at the coumadin clinic he was hardly able to lift  himself. No palpitations or blood in his stool or urine.     Past Medical History  Diagnosis Date  . Gout   . HTN (hypertension)   . Hyperlipidemia   . Osteoarthritis   . History of CVA (cerebrovascular accident)     Left pontine infarct July 2004; changed from Plavix to Coumadin in 2004 per MD at [facility]  . GERD (gastroesophageal reflux disease)   . DJD (degenerative joint disease)   . BPH (benign prostatic hypertrophy)   . DM (diabetes mellitus), type 2   . Peripheral vascular disease   . Bilateral leg ulcer     ACHILLES AREA--  NONHEALING  . CKD (chronic kidney disease) stage 3, GFR 30-59 ml/min   . CAD (coronary artery disease)     a. LHC 4/12: Mid LAD 25%, mid diagonal 30%, AV circumflex 40%, proximal OM 25%, distal RCA 40%  . Chronic combined systolic and diastolic heart failure     a. Echo 05/2013: EF 25-30%, diffuse HK, restrictive physiology, trivial AI, mild MR, moderate LAE, reduced RV systolic function, PASP 42  . LBBB (left bundle branch block)   . Atrial fibrillation     a. amiodarone Rx started 05/2013;  b. chronic coumadin  . NICM (nonischemic cardiomyopathy)     a. EF 25-30%.  [name] Hx of cardiovascular stress test     Nuclear Stress Test (1/16): High risk stress nuclear study with a large, severe, partially reversible inferior and apical defect consistent with prior inferior and  apical infarct; mild apical ischemia; severe LVE; study high risk due to reduced LV function.  EF 25% >> reviewed with Dr. Antoine Poche >> medical mgmt    Past Surgical History  Procedure Laterality Date  . Cardiac catheterization  04-16-2011   DR [facility]    NON-OBSTRUCTIVE CAD. MILDLY ELEVATED PULMONARY PRESSURES/ ELEVATED  END-DIASTOLIC PRESSURE  . Transthoracic echocardiogram  12-31-2010    MODERATE CONCENTRIC LVH/ SYSTOLIC FUNCTION SEVERELY REDUCED/ EF 25-30%/  SEVERE HYPOKINESIS OF ANTEROSEPTAL MYOCARDIUM  AND ENTIREAPICAL MYOCARDIUM /  MODERATE HYPOKINESIS OF LATERAL, INFEROLATERAL,  INFERIOR,AND INFEROSEPTAL MYOCARIUM/  GRADE 3 DIASTOLIC DYSFUNCTION/ MILD MR  . Aortogram w/ bilateral lower extremitiy runoff  05-18-2013  DR FIELDS    LEFT LEG OCCLUDED PERONEAL AND ANTERIOR TIBIAL ARTERIES/ HIGH GRADE STENOIS 80% MIDDLE AND DISTAL THIRD OF POSTERIOR TIBIAL ARTERY/ RIGHT PERONEAL AND ANTERIOR TIBIAL ARTERY OCCLUDED/ 40% STENOSIS DISTALLY   . Abdominal aortagram N/A 05/18/2013    Procedure: ABDOMINAL Ronny Flurry;  Surgeon: Sherren Kerns, MD;  Location: [facility] CATH LAB;  Service: Cardiovascular;  Laterality: N/A;  . Lower extremity angiogram Bilateral 05/18/2013    Procedure: LOWER EXTREMITY ANGIOGRAM;  Surgeon: Sherren Kerns, MD;  Location: [facility] CATH LAB;  Service: Cardiovascular;  Laterality: Bilateral;     Current Outpatient Prescriptions  Medication Sig Dispense Refill  . amLODipine (NORVASC) 10 MG tablet Take 10 mg by mouth daily.    [name] atorvastatin (LIPITOR) 20 MG tablet Take 1 tablet (20 mg total) by mouth daily. 90 tablet 3  . carvedilol (COREG) 6.25 MG tablet TAKE 1 TABLET (6.25 MG TOTAL) BY MOUTH 2 (TWO) TIMES DAILY WITH A MEAL. 60 tablet 5  . cholecalciferol (VITAMIN D) 1000 UNITS tablet Take 1,000 Units by mouth daily.    . CVS VITAMIN B12 1000 MCG tablet TAKE 1 TABLET BY MOUTH DAILY 100 tablet 3  . ferrous sulfate 325 (65 FE) MG tablet TAKE 1 TABLET BY MOUTH EVERY DAY 30 tablet 6  . glipiZIDE (GLUCOTROL) 5 MG tablet TAKE 1/2 TABLET BY MOUTH TWICE A DAY BEFORE A MEAL 30 tablet 3  . hydrALAZINE (APRESOLINE) 50 MG tablet 1 and 1/2 tablet three times a day 135 tablet 3  . HYDROcodone-acetaminophen (NORCO/VICODIN) 5-325 MG per tablet Take 1 tablet by mouth 2 (two) times daily as needed. 60 tablet 0  . isosorbide mononitrate (IMDUR) 60 MG 24 hr tablet TAKE 1 TABLET (60 MG TOTAL) BY MOUTH DAILY. 30 tablet 5  . levothyroxine (SYNTHROID, LEVOTHROID) 100 MCG tablet Take 2 tablets (200 mcg total) by mouth daily before breakfast. 60 tablet 1  . metolazone (ZAROXOLYN) 5 MG tablet  Take 5 mg by mouth as needed.    . potassium chloride SA (KLOR-CON M20) 20 MEQ tablet Take 3 tabs TWICE DAILY    . SYMBICORT 160-4.5 MCG/ACT inhaler INHALE 2 PUFFS INTO THE LUNGS 2 TIMES DAILY 10.2 g 5  . torsemide (DEMADEX) 20 MG tablet 20 MG DAILY    . triamcinolone cream (KENALOG) 0.5 % Apply 1 application topically 2 (two) times daily as needed (for skin breakdown). 30 g 0  . warfarin (COUMADIN) 5 MG tablet TAKE 1 TABLET (5 MG TOTAL) BY MOUTH AS DIRECTED. 40 tablet 2   No current facility-administered medications for this visit.    Allergies:   Review of patient's allergies indicates no known allergies.    Social History:  The patient  reports that he quit smoking about 40 years ago. He has never used smokeless tobacco. He reports that he does not  drink alcohol or use illicit drugs.   Family History:  [name] family history includes Diabetes in his mother; Gout in his other; Heart attack in his maternal grandmother; Heart disease in his father and mother; Hypertension in his father and mother; Stroke in his other. There is no history of Thyroid disease.    ROS:  Please see the history of present illness.    All other systems are reviewed and negative.    PHYSICAL EXAM: VS:  BP 154/86 mmHg  Pulse 67  Wt 224 lb (101.606 kg)  SpO2 97% , BMI Body mass index is 28 kg/(m^2). GEN: Well nourished, well developed, in no acute distress HEENT: normal Neck: no JVD, carotid bruits, or masses Cardiac: RRR; no murmurs, rubs, or gallops,no edema  Respiratory:  clear to auscultation bilaterally, normal work of breathing GI: soft, nontender, nondistended, + BS MS: no deformity or atrophy Skin: warm and dry, no rash Neuro:  Strength and sensation are intact Psych: euthymic mood, full affect   EKG:  EKG IS ordered today. This revealed NSR with PAC, LBBB, LAD HR 63    Recent Labs: 06/24/2014: Magnesium 2.0 08/12/2014: ALT 21 09/12/2014: Hemoglobin 12.4*; Platelets 187.0 12/23/2014: TSH  6.04* 12/31/2014: Pro B Natriuretic peptide (BNP) 245.0* 01/03/2015: BUN 34*; Creatinine 2.91*; Potassium 4.4; Sodium 150*    Lipid Panel    Component Value Date/Time   CHOL 269* 06/24/2014 1139   TRIG 133.0 06/24/2014 1139   TRIG 89 12/21/2006 1117   HDL 56.50 06/24/2014 1139   CHOLHDL 5 06/24/2014 1139   VLDL 26.6 06/24/2014 1139   LDLCALC 186* 06/24/2014 1139   LDLDIRECT 177.9 06/17/2010 0909      Wt Readings from Last 3 Encounters:  01/23/15 224 lb (101.606 kg)  01/16/15 224 lb (101.606 kg)  01/03/15 221 lb (100.245 kg)   weight today 224lbs.    Other studies Reviewed: Additional studies/ records that were reviewed today include: Nuclear Stress Test (1/16): High risk stress nuclear study with a large, severe, partially reversible inferior and apical defect consistent with prior inferior and apical infarct; mild apical ischemia; severe LVE; study high risk due to reduced LV function. EF 25%   ASSESSMENT AND PLAN:  Frank Mclean is a 74 y.o. 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   Dyspnea:  He had had chronic complaints of dyspnea which was suspected to be from deconditioning and recent profound hypothyroidism. Hypothyroidism is now being corrected now that he is off his Amiodarone. He reported improved breathing at follow up visit last week. However, he now complains of worsening sx and weakness. He does not appear grossly volume overloaded on my exam and his weight is stable from last week at 224lbs. I am reluctant to increase his diuretics with his CKD. I will check lab work to further evaluated his potassium and creat. If these are stable we may trial increased  diuretics for 1-2days to see if this helps. Will also order a BNP and CBC. Patient refusing CXR -- Consider low output HF. Will get labs and discuss possibility of needing a RHC +/-  advanced CHF therapies with Dr. Kirtland Bouchard. If dyspnea not explained by CHF, may need pulm consult.  Weakness- will check basic lab work, including potassium in the setting of metolazone and torsemide therapy.   Chronic Combined Systolic and Diastolic CHF:  Volume appears stable. Weight stable from last week at 224lbs. Continue current dose of diuretics (metolazone 5mg  and torsemide 20mg ) for now.  --  Continue beta blocker, nitrates, hydralazine.  -- We may need to consider referral to EP for consideration of by [facility] as he has a LBBB.   Hypothyroidism:  Managed by PCP.  Now off Amio. TSH 12/25/13 6.04  CAD:  He has a history of nonobstructive disease by prior cardiac catheterization. Recent Myoview was called high risk but this was mainly for his reduced ejection fraction. He denies chest discomfort.  We will continue medical therapy as he is high risk for contrast-induced nephropathy in the setting of chronic kidney disease.  -- Continue beta blocker, nitrates, amlodipine. He is not on aspirin as he is on Coumadin.  Atrial Fibrillation:  Maintaining NSR. HR 63.  Continue Coumadin.  He is now off Amio.    CKD:  Follow-up with nephrology as planned.  He has declined AVF placement for now.    Hypertension:  BP  154/86 today. Continue same regimen   Hyperlipidemia:  Continue Lipitor 20 mg daily. Check lipids and LFTs in 6-8 weeks per Tereso Newcomer PA-C  PVD with LE Ulcers:  These are healed.     Current medicines are reviewed at length with the patient today.  The patient does not have concerns regarding medicines.  The following changes have been made:  no change  Labs/ tests ordered today include:   Orders Placed This Encounter  Procedures  . Basic Metabolic Panel (BMET)  . B Nat Peptide  . CBC with  Differential  . EKG 12-Lead     Disposition:   FU with Dr Antoine Poche  in 4-6 weeks or sooner if needed   Signed, Allena Katz  01/23/2015 1:01 PM    [facility] [address] [place], [place], [place]  [address] Phone: [phone]; Fax: [phone]

## 2015-01-27 ENCOUNTER — Other Ambulatory Visit: Payer: Self-pay | Admitting: Internal Medicine

## 2015-01-27 ENCOUNTER — Ambulatory Visit (INDEPENDENT_AMBULATORY_CARE_PROVIDER_SITE_OTHER): Payer: Medicare Other | Admitting: Cardiology

## 2015-01-27 ENCOUNTER — Other Ambulatory Visit: Payer: Self-pay | Admitting: *Deleted

## 2015-01-27 VITALS — BP 130/62 | HR 61 | Ht 74.0 in | Wt 222.0 lb

## 2015-01-27 DIAGNOSIS — I4891 Unspecified atrial fibrillation: Secondary | ICD-10-CM | POA: Diagnosis not present

## 2015-01-27 DIAGNOSIS — I509 Heart failure, unspecified: Secondary | ICD-10-CM | POA: Diagnosis not present

## 2015-01-27 DIAGNOSIS — I639 Cerebral infarction, unspecified: Secondary | ICD-10-CM | POA: Diagnosis not present

## 2015-01-27 DIAGNOSIS — I5043 Acute on chronic combined systolic (congestive) and diastolic (congestive) heart failure: Secondary | ICD-10-CM

## 2015-01-27 DIAGNOSIS — N189 Chronic kidney disease, unspecified: Secondary | ICD-10-CM

## 2015-01-27 MED ORDER — POTASSIUM CHLORIDE CRYS ER 20 MEQ PO TBCR: EXTENDED_RELEASE_TABLET | ORAL | Status: DC

## 2015-01-27 MED ORDER — ISOSORBIDE MONONITRATE ER 60 MG PO TB24: 30.0000 mg | ORAL_TABLET | Freq: Every day | ORAL | Status: DC

## 2015-01-27 MED ORDER — ISOSORBIDE MONONITRATE ER 60 MG PO TB24: 120.0000 mg | ORAL_TABLET | Freq: Every day | ORAL | Status: DC

## 2015-01-27 MED ORDER — HYDRALAZINE HCL 100 MG PO TABS: 100.0000 mg | ORAL_TABLET | Freq: Two times a day (BID) | ORAL | Status: DC

## 2015-01-27 NOTE — Progress Notes (Signed)
HPI The patient presents for evaluation of heart failure.  Unfortunately he's continued to do poorly. His weights have been stable. His creatinine recently checked was stable at 2.62. He has had his nitrates titrated upward. His blood pressures have been well controlled. However, he is now requiring oxygen during the day when he is particularly tired. He wears his oxygen at night. He's had progressive fatigue with minimal exertion. He sleeping more. He's not describing any PND or orthopnea. He's not been having any palpitations, presyncope or syncope. He's not having any chest pressure, neck or arm discomfort.  The patient did have a recent stress test.  This demonstrated large severe partially reversible inferior and apical defect consistent with prior inferior apical infarct with some mild apical ischemia. The EF was approximately 25%. He had not had obstructive CAD on cath in 2012 however and has been diagonsed previously with a nonischemic cardiomyopathy.  This was managed medically at high risk cardiac catheterization was not plan because of his renal insufficiency.   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 His latest TSH was near normal. Unfortunately has fatigue has not improved as this has normalized.   No Known Allergies  Current Outpatient Prescriptions  Medication Sig Dispense Refill  . amLODipine (NORVASC) 10 MG tablet Take 10 mg by mouth daily.    [name] atorvastatin (LIPITOR) 20 MG tablet Take 1 tablet (20 mg total) by mouth daily. 90 tablet 3  . carvedilol (COREG) 6.25 MG tablet TAKE 1 TABLET (6.25 MG TOTAL) BY MOUTH 2 (TWO) TIMES DAILY WITH A MEAL. 60 tablet 5  . cholecalciferol (VITAMIN D) 1000 UNITS tablet Take 1,000 Units by mouth daily.    . CVS VITAMIN B12 1000 MCG tablet TAKE 1 TABLET BY MOUTH DAILY 100 tablet 3  . ferrous sulfate 325 (65 FE) MG tablet TAKE 1 TABLET BY MOUTH EVERY DAY 30 tablet 6  . glipiZIDE (GLUCOTROL) 5 MG  tablet TAKE 1/2 TABLET BY MOUTH TWICE A DAY BEFORE A MEAL 30 tablet 3  . hydrALAZINE (APRESOLINE) 50 MG tablet 1 and 1/2 tablet three times a day 135 tablet 3  . HYDROcodone-acetaminophen (NORCO/VICODIN) 5-325 MG per tablet Take 1 tablet by mouth 2 (two) times daily as needed. 60 tablet 0  . levothyroxine (SYNTHROID, LEVOTHROID) 100 MCG tablet Take 2 tablets (200 mcg total) by mouth daily before breakfast. 60 tablet 1  . metolazone (ZAROXOLYN) 5 MG tablet Take 5 mg by mouth as needed.    . SYMBICORT 160-4.5 MCG/ACT inhaler INHALE 2 PUFFS INTO THE LUNGS 2 TIMES DAILY 10.2 g 5  . torsemide (DEMADEX) 20 MG tablet 20 MG DAILY    . triamcinolone cream (KENALOG) 0.5 % Apply 1 application topically 2 (two) times daily as needed (for skin breakdown). 30 g 0  . warfarin (COUMADIN) 5 MG tablet TAKE 1 TABLET (5 MG TOTAL) BY MOUTH AS DIRECTED. 40 tablet 2  . isosorbide mononitrate (IMDUR) 60 MG 24 hr tablet Take 0.5 tablets (30 mg total) by mouth daily. 45 tablet 5  . potassium chloride SA (KLOR-CON M20) 20 MEQ tablet Take 3 tabs TWICE DAILY 180 tablet 6   No current facility-administered medications for this visit.    Past Medical History  Diagnosis Date  . Gout   . HTN (hypertension)   . Hyperlipidemia   . Osteoarthritis   . History of CVA (cerebrovascular accident)     Left pontine infarct July 2004; changed from Plavix to Coumadin  in 2004 per MD at [facility]  . GERD (gastroesophageal reflux disease)   . DJD (degenerative joint disease)   . BPH (benign prostatic hypertrophy)   . DM (diabetes mellitus), type 2   . Peripheral vascular disease   . Bilateral leg ulcer     ACHILLES AREA--  NONHEALING  . CKD (chronic kidney disease) stage 3, GFR 30-59 ml/min   . CAD (coronary artery disease)     a. LHC 4/12: Mid LAD 25%, mid diagonal 30%, AV circumflex 40%, proximal OM 25%, distal RCA 40%  . Chronic combined systolic and diastolic heart failure     a. Echo 05/2013: EF 25-30%, diffuse HK, restrictive  physiology, trivial AI, mild MR, moderate LAE, reduced RV systolic function, PASP 42  . LBBB (left bundle branch block)   . Atrial fibrillation     a. amiodarone Rx started 05/2013;  b. chronic coumadin  . NICM (nonischemic cardiomyopathy)     a. EF 25-30%.  [name] Hx of cardiovascular stress test     Nuclear Stress Test (1/16): High risk stress nuclear study with a large, severe, partially reversible inferior and apical defect consistent with prior inferior and apical infarct; mild apical ischemia; severe LVE; study high risk due to reduced LV function.  EF 25% >> reviewed with Dr. Antoine Poche >> medical mgmt    Past Surgical History  Procedure Laterality Date  . Cardiac catheterization  04-16-2011   DR [facility]    NON-OBSTRUCTIVE CAD. MILDLY ELEVATED PULMONARY PRESSURES/ ELEVATED  END-DIASTOLIC PRESSURE  . Transthoracic echocardiogram  12-31-2010    MODERATE CONCENTRIC LVH/ SYSTOLIC FUNCTION SEVERELY REDUCED/ EF 25-30%/  SEVERE HYPOKINESIS OF ANTEROSEPTAL MYOCARDIUM  AND ENTIREAPICAL MYOCARDIUM /  MODERATE HYPOKINESIS OF LATERAL, INFEROLATERAL, INFERIOR,AND INFEROSEPTAL MYOCARIUM/  GRADE 3 DIASTOLIC DYSFUNCTION/ MILD MR  . Aortogram w/ bilateral lower extremitiy runoff  05-18-2013  DR FIELDS    LEFT LEG OCCLUDED PERONEAL AND ANTERIOR TIBIAL ARTERIES/ HIGH GRADE STENOIS 80% MIDDLE AND DISTAL THIRD OF POSTERIOR TIBIAL ARTERY/ RIGHT PERONEAL AND ANTERIOR TIBIAL ARTERY OCCLUDED/ 40% STENOSIS DISTALLY   . Abdominal aortagram N/A 05/18/2013    Procedure: ABDOMINAL Ronny Flurry;  Surgeon: Sherren Kerns, MD;  Location: [facility] CATH LAB;  Service: Cardiovascular;  Laterality: N/A;  . Lower extremity angiogram Bilateral 05/18/2013    Procedure: LOWER EXTREMITY ANGIOGRAM;  Surgeon: Sherren Kerns, MD;  Location: [facility] CATH LAB;  Service: Cardiovascular;  Laterality: Bilateral;    ROS:  As stated in the HPI and negative for all other systems.  PHYSICAL EXAM BP 130/62 mmHg  Pulse 61  Ht  (1.88 m)  Wt 222 lb  (100.699 kg)  BMI 28.49 kg/m2 GENERAL:  Well appearing, but fatigued NECK:  Mild JVD, waveform within normal limits, carotid upstroke brisk and symmetric, no bruits, no thyromegaly LUNGS:  Clear to auscultation bilaterally BACK:  No CVA tenderness CHEST:  Unremarkable HEART:  PMI not displaced or sustained,S1 and S2 within normal limits, no S3, no S4, no clicks, no rubs, no murmurs ABD:  Flat, positive bowel sounds normal in frequency in pitch, no bruits, no rebound, no guarding, no midline pulsatile mass, no hepatomegaly, no splenomegaly EXT:  2 plus pulses throughout, left greater than right trace edema, no cyanosis no clubbing.   NEURO:  Nonfocal SKIN:  No rashes no nodules  EKG:  Sinus rhythm, rate 61, left bundle branch block, left axis deviation, no change from previous. 01/27/2015  ASSESSMENT AND PLAN  CONGESTIVE HEART FAILURE -  I think he has low output  heart failure. Given the perfusion defect on his stress test and his progressive symptoms I think it is necessary to consider high risk cardiac catheterization with his renal insufficiency. He would need right and left heart cath and possible inotropic therapy. I'm going to have him first seen in the heart failure clinic. They can schedule follow-up invasive testing. I will follow him back as well periodically in my clinic. Today I will titrate his hydralazine and Imdur.  HYPERTENSION -  This is being managed in the context of treating his CHF  PVD - His lower extremity ulcers have healed.  ATRIAL FIBRILLATION:   The patient seems to be maintaining sinus rhythm. He is off of the amiodarone with his profound hypothyroidism.  He will remain on warfarin.  CKD:   His creatinine is stable.

## 2015-01-27 NOTE — Patient Instructions (Addendum)
Your physician recommends that you schedule a follow-up appointment in: 3 months with Dr. Antoine Poche  We are setting you up with the advanced heart failure clinic  We have increased your hydralazine to 100 mg three times a day  We have increased your imdur to 120 mg daily

## 2015-01-28 ENCOUNTER — Encounter: Payer: Self-pay | Admitting: Cardiology

## 2015-01-29 ENCOUNTER — Other Ambulatory Visit: Payer: Self-pay | Admitting: *Deleted

## 2015-02-03 ENCOUNTER — Encounter: Payer: Self-pay | Admitting: Internal Medicine

## 2015-02-03 ENCOUNTER — Ambulatory Visit (INDEPENDENT_AMBULATORY_CARE_PROVIDER_SITE_OTHER): Payer: Medicare Other | Admitting: Internal Medicine

## 2015-02-03 VITALS — BP 130/82 | HR 61 | Temp 97.7°F | Wt 222.0 lb

## 2015-02-03 DIAGNOSIS — E1122 Type 2 diabetes mellitus with diabetic chronic kidney disease: Secondary | ICD-10-CM

## 2015-02-03 DIAGNOSIS — I2510 Atherosclerotic heart disease of native coronary artery without angina pectoris: Secondary | ICD-10-CM | POA: Diagnosis not present

## 2015-02-03 DIAGNOSIS — T462X1A Poisoning by other antidysrhythmic drugs, accidental (unintentional), initial encounter: Secondary | ICD-10-CM

## 2015-02-03 DIAGNOSIS — E538 Deficiency of other specified B group vitamins: Secondary | ICD-10-CM | POA: Diagnosis not present

## 2015-02-03 DIAGNOSIS — N189 Chronic kidney disease, unspecified: Secondary | ICD-10-CM

## 2015-02-03 DIAGNOSIS — E038 Other specified hypothyroidism: Secondary | ICD-10-CM | POA: Diagnosis not present

## 2015-02-03 DIAGNOSIS — I639 Cerebral infarction, unspecified: Secondary | ICD-10-CM | POA: Diagnosis not present

## 2015-02-03 DIAGNOSIS — F329 Major depressive disorder, single episode, unspecified: Secondary | ICD-10-CM | POA: Insufficient documentation

## 2015-02-03 DIAGNOSIS — M1000 Idiopathic gout, unspecified site: Secondary | ICD-10-CM

## 2015-02-03 DIAGNOSIS — R5383 Other fatigue: Secondary | ICD-10-CM

## 2015-02-03 DIAGNOSIS — F32A Depression, unspecified: Secondary | ICD-10-CM

## 2015-02-03 DIAGNOSIS — I509 Heart failure, unspecified: Secondary | ICD-10-CM | POA: Diagnosis not present

## 2015-02-03 DIAGNOSIS — E032 Hypothyroidism due to medicaments and other exogenous substances: Secondary | ICD-10-CM

## 2015-02-03 MED ORDER — ESCITALOPRAM OXALATE 5 MG PO TABS: 5.0000 mg | ORAL_TABLET | Freq: Every day | ORAL | Status: DC

## 2015-02-03 NOTE — Assessment & Plan Note (Signed)
Better  

## 2015-02-03 NOTE — Progress Notes (Signed)
Subjective:    HPI  C/o LBP F/u fatigue, SOB, weakness, apathy, sleepiness, and hypothyroidism. Lauris is not weaker. His brother-in-law is saying that he had a hard time catching up with Leory Plowman at [employer] the other day... Using O2 at home  74 y.o. male w/ PMHx s/f chronic combined CHF, nonischemic cardiomyoapthy, nonobstructive CAD, PAF (amiodarone and Coumadin), chronic LBBB, DM2, HTN, HLD, PAD, CKD (stage III, baseline Cr 1.9-2.6), h/o CVA and chronic venous ulcers who was admitted to [facility] for CAP and decompensated heart failure.  1. Acute on chronic combined CHF/NICM, EF 25-30%  2. Nonobstructive CAD  3. PAF  4. Subtherapeutic INR now therapeutic  5. Type 2 DM  6. HTN  7. HLD  8. H/o CVA  9. CKD, stage III  10. Chronic venous ulcers  11. Hypokalemia  12. PAD  13. Chronic LBBB  14. CAP - L lung Patient improved and was discharged on 12/10.    F/u: Near syncope  Active Problems:  DM (diabetes mellitus), type 2 with renal complications  HYPERLIPIDEMIA  HYPERTENSION  Acute on chronic combined systolic and diastolic congestive heart failure  CVA  CKD (chronic kidney disease) stage 3, GFR 30-59 ml/min  Wound, open, leg  Atherosclerotic peripheral vascular disease with intermittent claudication  Orthostatic hypotension  Hypokalemia  Elevated troponin  Atrial fibrillation with RVR  Wt Readings from Last 3 Encounters:  02/03/15 222 lb (100.699 kg)  01/27/15 222 lb (100.699 kg)  01/23/15 224 lb (101.606 kg)   BP Readings from Last 3 Encounters:  02/03/15 130/82  01/27/15 130/62  01/23/15 154/86      Review of Systems  Constitutional: Positive for fatigue. Negative for appetite change and unexpected weight change.  HENT: Negative for congestion, nosebleeds, sneezing and trouble swallowing.   Eyes: Negative for itching and visual disturbance.  Cardiovascular: Positive for leg swelling. Negative for palpitations.  Gastrointestinal: Negative for nausea,  diarrhea, blood in stool and abdominal distention.  Genitourinary: Negative for frequency and hematuria.  Musculoskeletal: Positive for arthralgias. Negative for back pain, joint swelling, gait problem and neck pain.  Neurological: Negative for dizziness, tremors, speech difficulty and weakness.  Psychiatric/Behavioral: Positive for behavioral problems and decreased concentration. Negative for suicidal ideas, sleep disturbance, dysphoric mood and agitation. The patient is nervous/anxious.        Objective:   Physical Exam  Constitutional: He is oriented to person, place, and time. He appears well-developed. No distress.  Obese, appears chronically ill and tired NAD  HENT:  Mouth/Throat: Oropharynx is clear and moist.  Eyes: Conjunctivae are normal. Pupils are equal, round, and reactive to light.  Neck: Normal range of motion. No JVD present. No thyromegaly present.  Cardiovascular: Normal rate, regular rhythm, normal heart sounds and intact distal pulses.  Exam reveals no gallop and no friction rub.   No murmur heard. Pulmonary/Chest: Effort normal and breath sounds normal. No respiratory distress. He has no wheezes. He has no rales. He exhibits no tenderness.  Abdominal: Soft. Bowel sounds are normal. He exhibits no distension and no mass. There is no tenderness. There is no rebound and no guarding.  Musculoskeletal: Normal range of motion. He exhibits edema. He exhibits no tenderness.  Lymphadenopathy:    He has no cervical adenopathy.  Neurological: He is alert and oriented to person, place, and time. He has normal reflexes. No cranial nerve deficit. He exhibits normal muscle tone. He displays a negative Romberg sign. Coordination and gait normal.  No meningeal signs  Skin:  Skin is warm and dry. No rash noted.  Psychiatric: He has a normal mood and affect. His behavior is normal. Judgment and thought content normal.   Trace edema B  Depressed, quiet   Lab Results  Component  Value Date   WBC 9.6 01/23/2015   HGB 10.9* 01/23/2015   HCT 34.2* 01/23/2015   PLT 198.0 01/23/2015   GLUCOSE 98 01/23/2015   CHOL 269* 06/24/2014   TRIG 133.0 06/24/2014   HDL 56.50 06/24/2014   LDLDIRECT 177.9 06/17/2010   LDLCALC 186* 06/24/2014   ALT 21 08/12/2014   AST 27 08/12/2014   NA 145 01/23/2015   K 4.9 01/23/2015   CL 114* 01/23/2015   CREATININE 2.62* 01/23/2015   BUN 26* 01/23/2015   CO2 26 01/23/2015   TSH 6.04* 12/23/2014   PSA 0.01* 11/18/2011   INR 2.0 01/23/2015   HGBA1C 5.8 06/24/2014          Assessment & Plan:  Patient ID: Frank Mclean, male   DOB: [birth year], 74 y.o.   MRN: [id number]

## 2015-02-03 NOTE — Progress Notes (Signed)
Pre visit review using our clinic review tool, if applicable. No additional management support is needed unless otherwise documented below in the visit note. 

## 2015-02-03 NOTE — Assessment & Plan Note (Signed)
Continue with current prescription therapy as reflected on the Med list. On O2  Per Dr Rexene Edison. - CONGESTIVE HEART FAILURE -  "I think he has low output heart failure. Given the perfusion defect on his stress test and his progressive symptoms I think it is necessary to consider high risk cardiac catheterization with his renal insufficiency. He would need right and left heart cath and possible inotropic therapy. I'm going to have him first seen in the heart failure clinic. They can schedule follow-up invasive testing. I will follow him back as well periodically in my clinic. Today I will titrate his hydralazine and Imdur."

## 2015-02-03 NOTE — Assessment & Plan Note (Signed)
Multifactorial: CHF, depression, other Trial of Lexapro

## 2015-02-03 NOTE — Assessment & Plan Note (Signed)
Card cath is planned Continue with current prescription therapy as reflected on the Med list.

## 2015-02-03 NOTE — Assessment & Plan Note (Signed)
Continue with current prescription therapy as reflected on the Med list.  

## 2015-02-03 NOTE — Assessment & Plan Note (Signed)
2/16 due to chronic illness, CHF Worse Start Lexapro - low dose

## 2015-02-03 NOTE — Assessment & Plan Note (Signed)
No relapse 

## 2015-02-06 ENCOUNTER — Ambulatory Visit: Payer: Medicare Other | Admitting: Internal Medicine

## 2015-02-06 ENCOUNTER — Telehealth (HOSPITAL_COMMUNITY): Payer: Self-pay | Admitting: Cardiology

## 2015-02-06 ENCOUNTER — Encounter (HOSPITAL_COMMUNITY): Payer: Self-pay

## 2015-02-06 ENCOUNTER — Ambulatory Visit (HOSPITAL_COMMUNITY)
Admission: RE | Admit: 2015-02-06 | Discharge: 2015-02-06 | Disposition: A | Discharge status: Home / Self Care | Payer: Medicare Other | Source: Ambulatory Visit | Attending: Cardiology | Admitting: Cardiology

## 2015-02-06 ENCOUNTER — Encounter (HOSPITAL_COMMUNITY): Payer: Self-pay | Admitting: Cardiology

## 2015-02-06 VITALS — BP 126/68 | HR 57 | Wt 220.8 lb

## 2015-02-06 DIAGNOSIS — Z79899 Other long term (current) drug therapy: Secondary | ICD-10-CM | POA: Diagnosis not present

## 2015-02-06 DIAGNOSIS — I4891 Unspecified atrial fibrillation: Secondary | ICD-10-CM | POA: Diagnosis not present

## 2015-02-06 DIAGNOSIS — I2510 Atherosclerotic heart disease of native coronary artery without angina pectoris: Secondary | ICD-10-CM

## 2015-02-06 DIAGNOSIS — I447 Left bundle-branch block, unspecified: Secondary | ICD-10-CM | POA: Diagnosis not present

## 2015-02-06 DIAGNOSIS — Z7901 Long term (current) use of anticoagulants: Secondary | ICD-10-CM | POA: Insufficient documentation

## 2015-02-06 DIAGNOSIS — N184 Chronic kidney disease, stage 4 (severe): Secondary | ICD-10-CM | POA: Insufficient documentation

## 2015-02-06 DIAGNOSIS — I5042 Chronic combined systolic (congestive) and diastolic (congestive) heart failure: Secondary | ICD-10-CM | POA: Diagnosis not present

## 2015-02-06 DIAGNOSIS — E785 Hyperlipidemia, unspecified: Secondary | ICD-10-CM | POA: Diagnosis not present

## 2015-02-06 DIAGNOSIS — I509 Heart failure, unspecified: Secondary | ICD-10-CM | POA: Diagnosis not present

## 2015-02-06 DIAGNOSIS — N183 Chronic kidney disease, stage 3 unspecified: Secondary | ICD-10-CM

## 2015-02-06 DIAGNOSIS — I129 Hypertensive chronic kidney disease with stage 1 through stage 4 chronic kidney disease, or unspecified chronic kidney disease: Secondary | ICD-10-CM | POA: Diagnosis not present

## 2015-02-06 DIAGNOSIS — N400 Enlarged prostate without lower urinary tract symptoms: Secondary | ICD-10-CM | POA: Diagnosis not present

## 2015-02-06 DIAGNOSIS — M109 Gout, unspecified: Secondary | ICD-10-CM | POA: Diagnosis not present

## 2015-02-06 DIAGNOSIS — Z8673 Personal history of transient ischemic attack (TIA), and cerebral infarction without residual deficits: Secondary | ICD-10-CM | POA: Diagnosis not present

## 2015-02-06 DIAGNOSIS — I5022 Chronic systolic (congestive) heart failure: Secondary | ICD-10-CM | POA: Diagnosis not present

## 2015-02-06 DIAGNOSIS — E039 Hypothyroidism, unspecified: Secondary | ICD-10-CM | POA: Diagnosis not present

## 2015-02-06 MED ORDER — HYDRALAZINE HCL 100 MG PO TABS: 100.0000 mg | ORAL_TABLET | Freq: Three times a day (TID) | ORAL | Status: DC

## 2015-02-06 NOTE — Patient Instructions (Signed)
INCREASE Hydralazine to 100 mg three times per day  Your physician has requested that you have a cardiac catheterization. Cardiac catheterization is used to diagnose and/or treat various heart conditions. Doctors may recommend this procedure for a number of different reasons. The most common reason is to evaluate chest pain. Chest pain can be a symptom of coronary artery disease (CAD), and cardiac catheterization can show whether plaque is narrowing or blocking your heart's arteries. This procedure is also used to evaluate the valves, as well as measure the blood flow and oxygen levels in different parts of your heart. For further information please visit [email or web address] Please follow instruction sheet, as given.  You have been referred to [facility] for Electrophysiology  [address]                                                                Dr.Greg Ladona Ridgel  03/05/2015 @ 10:15 AM Your physician has requested that you have an echocardiogram. Echocardiography is a painless test that uses sound waves to create images of your heart. It provides your doctor with information about the size and shape of your heart and how well your heart's chambers and valves are working. This procedure takes approximately one hour. There are no restrictions for this procedure.  Your physician recommends that you schedule a follow-up appointment in: 2 weeks

## 2015-02-06 NOTE — Telephone Encounter (Signed)
Pt scheduled for [facility] on 02/13/2015 with Dr.McLean Cpt code 80998 icd 10-i50.22 With pts current insurance- medicare A and B/[employer]- [employer]

## 2015-02-06 NOTE — Progress Notes (Signed)
Patient ID: Frank Mclean, male   DOB: 06/23/1941, 73 y.o.   MRN: [id number] PCP: Dr. Plotnikov Referring MD: Dr. Hochrein  73 yo with history of CKD, paroxysmal atrial fibrillation, and cardiomyopathy with chronic systolic CHF presents for CHF clinic evaluation.  Patient has been known to have a cardiomyopathy for 4-5 years now. He is followed by Dr Hochrein.  Last echo in 6/14 showed EF 25-30%.  He had coronary angiography in 4/12 with mild nonobstructive disease.  He has a chronic LBBB.  He has had paroxysmal atrial fibrillation in the past and was on amiodarone.  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  He is in NSR today.    He has had chronic exertional dyspnea.  This seems to have gradually worsened over the last 1-2 months but has actually been improving some over the last couple of weeks.  He is short of breath after walking about 100 feet or walking up a flight of stairs.  He is tired/winded but only mildly walking out to the car.  He is able to walk around in stores if he takes rest breaks.  He sleeps on 2 pillows, this is no change from the past.  No PND.  Weight has actually dropped some recently.  Family says that his abdomen was distended and that he had lower extremity edema about a month ago but this has resolved.  He uses oxygen at night and more recently has been using oxygen some during the day.  No chest pain or tightness.  No palpitations, lightheadedness, or syncope. General fatigue during the day.   Frank Mclean had a Lexiscan Cardiolite done in 1/16.  This showed possible large inferior and apical infact with mild peri-infarct ischemia.  EF was 25%, which is consistent with last echo in 6/14.   Labs (7/15): LDL 186 Labs (2/16): K 4.9, creatinine 2.91 => 2.62, proBNP 432, HCT 34.2  ECG (2/16): NSR, LBBB with QRS 168 msec  PMH: 1. CKD: Followed by Dr Powell. 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  He is now off amiodarone.  3.  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  He had cardiac cath in 4/12 with mild nonobstructive disease.  Echo (6/14) with EF 25-30%, prominent apical trabeculations, diffuse hypokinesis, mild Frank.  Lexiscan Cardiolite in 1/16 showed a large, severe primarily fixed inferior and apical perfusion defect with EF 25%, suggestive of prior infarct with mild peri-infarct ischemia.  4. CVA: 2004.  5. HTN 6. Gout 7. Hyperlipidemia 8. BPH 9. H/o LBBB 10. Atrial fibrillation: Paroxysmal.  Developed hypothyroidism on amiodarone and stopped it.  11. PAD: Has been followed by [facility] (Dr Fields). History of foot ulcers, now healed.   SH: Married, lives in , never smoked, no ETOH.   FH: Father lived into his 90s, no significant cardiac disease.   ROS: All systems reviewed and negative except as per HPI.   Current Outpatient Prescriptions  Medication Sig Dispense Refill  . amLODipine (NORVASC) 10 MG tablet Take 10 mg by mouth daily.    . atorvastatin (LIPITOR) 20 MG tablet Take 1 tablet (20 mg total) by mouth daily. 90 tablet 3  . carvedilol (COREG) 6.25 MG tablet TAKE 1 TABLET (6.25 MG TOTAL) BY MOUTH 2 (TWO) TIMES DAILY WITH A MEAL. 60 tablet 5  . cholecalciferol (VITAMIN D) 1000 UNITS tablet Take 1,000 Units by mouth daily.    . CVS VITAMIN B12 1000 MCG tablet TAKE 1 TABLET BY MOUTH DAILY 100 tablet 1  .   escitalopram (LEXAPRO) 5 MG tablet Take 1 tablet (5 mg total) by mouth daily. 30 tablet 5  . ferrous sulfate 325 (65 FE) MG tablet TAKE 1 TABLET BY MOUTH EVERY DAY 30 tablet 6  . glipiZIDE (GLUCOTROL) 5 MG tablet TAKE 1/2 TABLET BY MOUTH TWICE A DAY BEFORE A MEAL 30 tablet 11  . hydrALAZINE (APRESOLINE) 100 MG tablet Take 1 tablet (100 mg total) by mouth 3 (three) times daily. 90 tablet 6  . isosorbide mononitrate (IMDUR) 60 MG 24 hr tablet Take 2 tablets (120 mg total) by mouth daily. 180 tablet 6  . metolazone (ZAROXOLYN) 5 MG tablet Take 5 mg by mouth as needed.    . potassium chloride SA  (KLOR-CON M20) 20 MEQ tablet Take 3 tabs TWICE DAILY 180 tablet 6  . SYMBICORT 160-4.5 MCG/ACT inhaler INHALE 2 PUFFS INTO THE LUNGS 2 TIMES DAILY 10.2 g 5  . SYNTHROID 100 MCG tablet TAKE 2 TABLETS BY MOUTH EVERY MORNING BEFORE BREAKFAST 60 tablet 11  . torsemide (DEMADEX) 20 MG tablet 20 MG DAILY    . triamcinolone cream (KENALOG) 0.5 % Apply 1 application topically 2 (two) times daily as needed (for skin breakdown). 30 g 0  . warfarin (COUMADIN) 5 MG tablet TAKE 1 TABLET (5 MG TOTAL) BY MOUTH AS DIRECTED. 40 tablet 2   No current facility-administered medications for this encounter.   BP 126/68 mmHg  Pulse 57  Wt 220 lb 12.8 oz (100.154 kg)  SpO2 95% General: NAD Neck: No JVD, no thyromegaly or thyroid nodule.  Lungs: Clear to auscultation bilaterally with normal respiratory effort. CV: Nondisplaced PMI.  Heart regular S1/S2, no S3/S4, no murmur.  Trace ankle edema.  No carotid bruit.  Normal pedal pulses.  Abdomen: Soft, nontender, no hepatosplenomegaly, no distention.  Skin: Intact without lesions or rashes.  Neurologic: Alert and oriented x 3.  Psych: Normal affect. Extremities: No clubbing or cyanosis.  HEENT: Normal.   Assessment/Plan: 1. Chronic systolic CHF: EF 25-30% on 6/14 echo, 25% by 1/16 Cardiolite.  He has NYHA class III symptoms that have been waxing/waning but are generally worse over the last couple of months.  Fatigue is very prominent.  On exam, he does not appear particularly volume overloaded and per family his weight has been trending a bit downward.  He has a chronic LBBB.  Symptoms could be consistent with low cardiac output.   - I think that Frank Mclean needs a right heart cath to confirm filling pressures (does not look volume overloaded by exam) and also to assess cardiac output.  I will arrange to do this next week.  He will hold coumadin 3 days for the procedure.  Given history of CVA in 2004, I will talk to coumadin clinic about whether he will be off long  enough to need Lovenox bridge.   - Frank Mclean has chronically low EF with LBBB and wide QRS. I am going to refer him to EP for evaluation for CRT-D.  This may help a lot.  His wife sees Dr Taylor, so I will refer to Dr Taylor.  - Continue current dose of torsemide for now.  - Continue current Coreg, HR too low to titrate up at this point.  - No ACEI or spironolactone with CKD.  - Increase hydralazine to 100 mg tid.  Continue Imdur 120 mg daily.  - I will get an echo to assess RV and valves (none done since 6/14).  2. CAD: Patient had cardiac cath in 4/12   with mild nonobstructive CAD.  He has had no chest pain. Given increased exertional dyspnea, he had Cardiolite in 1/16.  This showed a primarily fixed large inferior and apical perfusion defect.  This likely represents prior infarction with minimal peri-infarct ischemia (versus artifact).  We talked at length about coronary angiography today. Given lack of chest pain and minimal ischemia, I have elected to hold off on coronary angiography in the setting of CKD stage III-IV.   - He is on warfarin so not on ASA.  - He will continue statin.  LDL very high in 7/15 but was off statin at that time.  Will check lipids at next appointment (goal LDL < 70).  3. Atrial fibrillation: Paroxysmal.  He is in NSR today.  Continue warfarin, Coreg. 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  This is now under control.  4. CKD: Stage III-IV.  Follows with Dr Powell.   Followup in the office in 2 wks.   Frank Mclean 02/06/2015    

## 2015-02-06 NOTE — Telephone Encounter (Signed)
Pt to be admitted from CHF clinic to day Cpt code 64680 icd10- i50.22 With pts current insurance- [facility] Pre cert/ref# [id number]

## 2015-02-07 ENCOUNTER — Ambulatory Visit: Payer: Medicare Other | Admitting: Internal Medicine

## 2015-02-10 ENCOUNTER — Encounter: Payer: Self-pay | Admitting: Podiatry

## 2015-02-10 ENCOUNTER — Ambulatory Visit (INDEPENDENT_AMBULATORY_CARE_PROVIDER_SITE_OTHER): Payer: Medicare Other | Admitting: Podiatry

## 2015-02-10 ENCOUNTER — Ambulatory Visit (INDEPENDENT_AMBULATORY_CARE_PROVIDER_SITE_OTHER): Payer: Medicare Other | Admitting: *Deleted

## 2015-02-10 VITALS — Ht 74.5 in | Wt 218.0 lb

## 2015-02-10 DIAGNOSIS — Z7901 Long term (current) use of anticoagulants: Secondary | ICD-10-CM | POA: Diagnosis not present

## 2015-02-10 DIAGNOSIS — M79676 Pain in unspecified toe(s): Secondary | ICD-10-CM | POA: Diagnosis not present

## 2015-02-10 DIAGNOSIS — E1142 Type 2 diabetes mellitus with diabetic polyneuropathy: Secondary | ICD-10-CM

## 2015-02-10 DIAGNOSIS — Z5181 Encounter for therapeutic drug level monitoring: Secondary | ICD-10-CM | POA: Diagnosis not present

## 2015-02-10 DIAGNOSIS — I6350 Cerebral infarction due to unspecified occlusion or stenosis of unspecified cerebral artery: Secondary | ICD-10-CM

## 2015-02-10 DIAGNOSIS — B351 Tinea unguium: Secondary | ICD-10-CM

## 2015-02-10 DIAGNOSIS — G629 Polyneuropathy, unspecified: Secondary | ICD-10-CM

## 2015-02-10 DIAGNOSIS — I639 Cerebral infarction, unspecified: Secondary | ICD-10-CM | POA: Diagnosis not present

## 2015-02-10 DIAGNOSIS — E1342 Other specified diabetes mellitus with diabetic polyneuropathy: Secondary | ICD-10-CM | POA: Diagnosis not present

## 2015-02-10 LAB — POCT INR: INR: 3.5

## 2015-02-10 MED ORDER — ENOXAPARIN SODIUM 100 MG/ML ~~LOC~~ SOLN: 100.0000 mg | SUBCUTANEOUS | Status: DC

## 2015-02-10 NOTE — Patient Instructions (Signed)
Diabetes and Foot Care Diabetes may cause you to have problems because of poor blood supply (circulation) to your feet and legs. This may cause the skin on your feet to become thinner, break easier, and heal more slowly. Your skin may become dry, and the skin may peel and crack. You may also have nerve damage in your legs and feet causing decreased feeling in them. You may not notice minor injuries to your feet that could lead to infections or more serious problems. Taking care of your feet is one of the most important things you can do for yourself.  HOME CARE INSTRUCTIONS  Wear shoes at all times, even in the house. Do not go barefoot. Bare feet are easily injured.  Check your feet daily for blisters, cuts, and redness. If you cannot see the bottom of your feet, use a mirror or ask someone for help.  Wash your feet with warm water (do not use hot water) and mild soap. Then pat your feet and the areas between your toes until they are completely dry. Do not soak your feet as this can dry your skin.  Apply a moisturizing lotion or petroleum jelly (that does not contain alcohol and is unscented) to the skin on your feet and to dry, brittle toenails. Do not apply lotion between your toes.  Trim your toenails straight across. Do not dig under them or around the cuticle. File the edges of your nails with an emery board or nail file.  Do not cut corns or calluses or try to remove them with medicine.  Wear clean socks or stockings every day. Make sure they are not too tight. Do not wear knee-high stockings since they may decrease blood flow to your legs.  Wear shoes that fit properly and have enough cushioning. To break in new shoes, wear them for just a few hours a day. This prevents you from injuring your feet. Always look in your shoes before you put them on to be sure there are no objects inside.  Do not cross your legs. This may decrease the blood flow to your feet.  If you find a minor scrape,  cut, or break in the skin on your feet, keep it and the skin around it clean and dry. These areas may be cleansed with mild soap and water. Do not cleanse the area with peroxide, alcohol, or iodine.  When you remove an adhesive bandage, be sure not to damage the skin around it.  If you have a wound, look at it several times a day to make sure it is healing.  Do not use heating pads or hot water bottles. They may burn your skin. If you have lost feeling in your feet or legs, you may not know it is happening until it is too late.  Make sure your health care provider performs a complete foot exam at least annually or more often if you have foot problems. Report any cuts, sores, or bruises to your health care provider immediately. SEEK MEDICAL CARE IF:   You have an injury that is not healing.  You have cuts or breaks in the skin.  You have an ingrown nail.  You notice redness on your legs or feet.  You feel burning or tingling in your legs or feet.  You have pain or cramps in your legs and feet.  Your legs or feet are numb.  Your feet always feel cold. SEEK IMMEDIATE MEDICAL CARE IF:   There is increasing redness,   swelling, or pain in or around a wound.  There is a red line that goes up your leg.  Pus is coming from a wound.  You develop a fever or as directed by your health care provider.  You notice a bad smell coming from an ulcer or wound. Document Released: 12/03/2000 Document Revised: 08/08/2013 Document Reviewed: 05/15/2013 Exit[employer] Patient Information 2015 [employer]. This information is not intended to replace advice given to you by your health care provider. Make sure you discuss any questions you have with your health care provider.  

## 2015-02-10 NOTE — Patient Instructions (Addendum)
2/22  Do not take any Coumadin or Lovenox  2/23   Do not take any Coumadin or Lovenox  2/24  Take Lovenox 100 mg injections  at 7a subcutaneously into abdominal tissue 2 inches away from navel and rotate sites  2/25  Day of procedure-do not take any Coumadin or Lovenox  2/26  Resume Coumadin 5mg  and Lovenox 100 mg injections at 7a and 7p subcutaneously into abdominal tissue 2 inches away from navel and rotate sites   2/27 Continue taking Coumadin 5mg   and Lovenox 100 mg injections at 7a and 7p subcutaneously into abdominal tissue 2 inches away from navel and rotate sites  2/28 Continue taking Coumadin 5mg  and Lovenox 100 mg injections at 7a and 7p subcutaneously into abdominal tissue 2 inches away from navel and rotate sites  2/29 Continue taking Coumadin 5mg  and take 1 dose of Lovenox 100 mg injections subcutaneously into abdominal tissue 2 inches away from navel and rotate sites prior to appt at 1245  Resume Lovenox and coumadin when it is approved by Dr. Shirlee Latch.

## 2015-02-10 NOTE — Progress Notes (Signed)
   Subjective:    Patient ID: Frank Mclean, male    DOB: January 16, 1941, 74 y.o.   MRN: [id number]  HPI Comments: N debridement L 10 toenails D and O long-term C enlongated, thickened toenails A diabetic and difficult to cut T hx of debridement 11/12/2013 at [facility]  Diabetes   patient denies history of claudication or diabetic foot ulcers history of traumatic ulcerations lower extremities bilaterally which were treated and resolved  Review of Systems  Skin:       Hx of wounds to B/L posterior heels.  All other systems reviewed and are negative.      Objective:   Physical Exam  Orientated 3  Vascular: DP pulses 2/4 bilaterally PT pulses 2/4 bilaterally Capillary reflex immediate bilaterally  Neurological: Sensation to 10 g monofilament wire intact 1/5 right and 0/5 left Vibratory sensation nonreactive bilaterally Ankle reflexes weekly reactive bilaterally  Dermatological: Medial lower legs have hypopigmentation skin where patient had history of traumatic ulcerations, bilaterally The toenails are extremely elongated, discolored, hypertrophic and tender to direct palpation 6-10  Musculoskeletal: HAV deformities bilaterally Hammertoe deformity second bilaterally There is no pain upon range of motion of ankle, subtalar, midtarsal joints bilaterally      Assessment & Plan:   Assessment: Diabetic peripheral neuropathy Symptomatic onychomycoses 6-10  Plan: Debrided toenails 10 without a bleeding Recommended reappoint at three-month intervals, however, patient requested to return at her request

## 2015-02-13 ENCOUNTER — Encounter (HOSPITAL_COMMUNITY): Payer: Self-pay | Admitting: Cardiology

## 2015-02-13 ENCOUNTER — Ambulatory Visit (HOSPITAL_COMMUNITY)
Admission: RE | Admit: 2015-02-13 | Discharge: 2015-02-13 | Disposition: A | Discharge status: Home / Self Care | Payer: Medicare Other | Source: Ambulatory Visit | Attending: Cardiology | Admitting: Cardiology

## 2015-02-13 ENCOUNTER — Encounter (HOSPITAL_COMMUNITY)
Admission: RE | Disposition: A | Discharge status: Home / Self Care | Payer: Self-pay | Source: Ambulatory Visit | Attending: Cardiology

## 2015-02-13 DIAGNOSIS — I509 Heart failure, unspecified: Secondary | ICD-10-CM | POA: Diagnosis not present

## 2015-02-13 DIAGNOSIS — I272 Other secondary pulmonary hypertension: Secondary | ICD-10-CM | POA: Diagnosis not present

## 2015-02-13 DIAGNOSIS — I447 Left bundle-branch block, unspecified: Secondary | ICD-10-CM | POA: Insufficient documentation

## 2015-02-13 DIAGNOSIS — I129 Hypertensive chronic kidney disease with stage 1 through stage 4 chronic kidney disease, or unspecified chronic kidney disease: Secondary | ICD-10-CM | POA: Insufficient documentation

## 2015-02-13 DIAGNOSIS — I429 Cardiomyopathy, unspecified: Secondary | ICD-10-CM | POA: Insufficient documentation

## 2015-02-13 DIAGNOSIS — Z8673 Personal history of transient ischemic attack (TIA), and cerebral infarction without residual deficits: Secondary | ICD-10-CM | POA: Insufficient documentation

## 2015-02-13 DIAGNOSIS — Z7901 Long term (current) use of anticoagulants: Secondary | ICD-10-CM | POA: Diagnosis not present

## 2015-02-13 DIAGNOSIS — N184 Chronic kidney disease, stage 4 (severe): Secondary | ICD-10-CM | POA: Diagnosis not present

## 2015-02-13 DIAGNOSIS — E039 Hypothyroidism, unspecified: Secondary | ICD-10-CM | POA: Diagnosis not present

## 2015-02-13 DIAGNOSIS — E785 Hyperlipidemia, unspecified: Secondary | ICD-10-CM | POA: Insufficient documentation

## 2015-02-13 DIAGNOSIS — M109 Gout, unspecified: Secondary | ICD-10-CM | POA: Diagnosis not present

## 2015-02-13 DIAGNOSIS — I2510 Atherosclerotic heart disease of native coronary artery without angina pectoris: Secondary | ICD-10-CM | POA: Insufficient documentation

## 2015-02-13 DIAGNOSIS — I5022 Chronic systolic (congestive) heart failure: Secondary | ICD-10-CM | POA: Insufficient documentation

## 2015-02-13 DIAGNOSIS — N400 Enlarged prostate without lower urinary tract symptoms: Secondary | ICD-10-CM | POA: Diagnosis not present

## 2015-02-13 DIAGNOSIS — I480 Paroxysmal atrial fibrillation: Secondary | ICD-10-CM | POA: Diagnosis present

## 2015-02-13 HISTORY — PX: RIGHT HEART CATHETERIZATION: SHX5447

## 2015-02-13 LAB — CBC
HCT: 32.5 % — ABNORMAL LOW (ref 39.0–52.0)
Hemoglobin: 9.8 g/dL — ABNORMAL LOW (ref 13.0–17.0)
MCH: 26.9 pg (ref 26.0–34.0)
MCHC: 30.2 g/dL (ref 30.0–36.0)
MCV: 89.3 fL (ref 78.0–100.0)
Platelets: 245 10*3/uL (ref 150–400)
RBC: 3.64 MIL/uL — ABNORMAL LOW (ref 4.22–5.81)
RDW: 15 % (ref 11.5–15.5)
WBC: 7.8 10*3/uL (ref 4.0–10.5)

## 2015-02-13 LAB — BASIC METABOLIC PANEL
ANION GAP: 10 (ref 5–15)
BUN: 26 mg/dL — AB (ref 6–23)
CO2: 22 mmol/L (ref 19–32)
Calcium: 8.7 mg/dL (ref 8.4–10.5)
Chloride: 114 mmol/L — ABNORMAL HIGH (ref 96–112)
Creatinine, Ser: 2.62 mg/dL — ABNORMAL HIGH (ref 0.50–1.35)
GFR calc Af Amer: 26 mL/min — ABNORMAL LOW (ref >90)
GFR calc non Af Amer: 22 mL/min — ABNORMAL LOW (ref >90)
Glucose, Bld: 90 mg/dL (ref 70–99)
Potassium: 4.4 mmol/L (ref 3.5–5.1)
Sodium: 146 mmol/L — ABNORMAL HIGH (ref 135–145)

## 2015-02-13 LAB — PROTIME-INR
INR: 2.16 — AB (ref 0.00–1.49)
Prothrombin Time: 24.2 seconds — ABNORMAL HIGH (ref 11.6–15.2)

## 2015-02-13 LAB — GLUCOSE, CAPILLARY
GLUCOSE-CAPILLARY: 89 mg/dL (ref 70–99)
GLUCOSE-CAPILLARY: 82 mg/dL (ref 70–99)

## 2015-02-13 SURGERY — RIGHT HEART CATH
Anesthesia: LOCAL

## 2015-02-13 MED ORDER — SODIUM CHLORIDE 0.9 % IV SOLN: 250.0000 mL | INTRAVENOUS | Status: DC | PRN

## 2015-02-13 MED ORDER — NITROGLYCERIN 1 MG/10 ML FOR IR/CATH LAB
INTRA_ARTERIAL | Status: AC
  Filled 2015-02-13: qty 10

## 2015-02-13 MED ORDER — MIDAZOLAM HCL 2 MG/2ML IJ SOLN
INTRAMUSCULAR | Status: AC
  Filled 2015-02-13: qty 2

## 2015-02-13 MED ORDER — SODIUM CHLORIDE 0.9 % IJ SOLN: 3.0000 mL | INTRAMUSCULAR | Status: DC | PRN

## 2015-02-13 MED ORDER — SODIUM CHLORIDE 0.9 % IJ SOLN: 3.0000 mL | Freq: Two times a day (BID) | INTRAMUSCULAR | Status: DC

## 2015-02-13 MED ORDER — HYDRALAZINE HCL 20 MG/ML IJ SOLN
INTRAMUSCULAR | Status: AC
  Filled 2015-02-13: qty 1

## 2015-02-13 MED ORDER — HEPARIN (PORCINE) IN NACL 2-0.9 UNIT/ML-% IJ SOLN
INTRAMUSCULAR | Status: AC
Start: 2015-02-13 — End: 2015-02-13
  Filled 2015-02-13: qty 1500

## 2015-02-13 MED ORDER — LIDOCAINE HCL (PF) 1 % IJ SOLN
INTRAMUSCULAR | Status: AC
  Filled 2015-02-13: qty 30

## 2015-02-13 MED ORDER — ASPIRIN 81 MG PO CHEW: 81.0000 mg | CHEWABLE_TABLET | ORAL | Status: DC

## 2015-02-13 MED ORDER — SODIUM CHLORIDE 0.9 % IV SOLN: INTRAVENOUS | Status: DC

## 2015-02-13 MED ORDER — ACETAMINOPHEN 325 MG PO TABS: 650.0000 mg | ORAL_TABLET | ORAL | Status: DC | PRN

## 2015-02-13 MED ORDER — FENTANYL CITRATE 0.05 MG/ML IJ SOLN
INTRAMUSCULAR | Status: AC
  Filled 2015-02-13: qty 2

## 2015-02-13 MED ORDER — ONDANSETRON HCL 4 MG/2ML IJ SOLN: 4.0000 mg | Freq: Four times a day (QID) | INTRAMUSCULAR | Status: DC | PRN

## 2015-02-13 MED ORDER — HYDRALAZINE HCL 20 MG/ML IJ SOLN
20.0000 mg | Freq: Once | INTRAMUSCULAR | Status: AC
  Administered 2015-02-13: 20 mg via INTRAVENOUS

## 2015-02-13 NOTE — H&P (View-Only) (Signed)
Patient ID: Frank Mclean, male   DOB: 1941-08-10, 74 y.o.   MRN: [id number] PCP: Dr. Posey Rea Referring MD: Dr. Antoine Poche  74 yo with history of CKD, paroxysmal atrial fibrillation, and cardiomyopathy with chronic systolic CHF presents for CHF clinic evaluation.  Patient has been known to have a cardiomyopathy for 4-5 years now. He is followed by Dr Antoine Poche.  Last echo in 6/14 showed EF 25-30%.  He had coronary angiography in 4/12 with mild nonobstructive disease.  He has a chronic LBBB.  He has had paroxysmal atrial fibrillation in the past and was on amiodarone.  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  He is in NSR today.    He has had chronic exertional dyspnea.  This seems to have gradually worsened over the last 1-2 months but has actually been improving some over the last couple of weeks.  He is short of breath after walking about 100 feet or walking up a flight of stairs.  He is tired/winded but only mildly walking out to the car.  He is able to walk around in stores if he takes rest breaks.  He sleeps on 2 pillows, this is no change from the past.  No PND.  Weight has actually dropped some recently.  Family says that his abdomen was distended and that he had lower extremity edema about a month ago but this has resolved.  He uses oxygen at night and more recently has been using oxygen some during the day.  No chest pain or tightness.  No palpitations, lightheadedness, or syncope. General fatigue during the day.   Frank Mclean had a Lexiscan Cardiolite done in 1/16.  This showed possible large inferior and apical infact with mild peri-infarct ischemia.  EF was 25%, which is consistent with last echo in 6/14.   Labs (7/15): LDL 186 Labs (2/16): K 4.9, creatinine 2.91 => 2.62, proBNP 432, HCT 34.2  ECG (2/16): NSR, LBBB with QRS 168 msec  PMH: 1. CKD: Followed by Dr Lowell Guitar. 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  He is now off amiodarone.  3.  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  He had cardiac cath in 4/12 with mild nonobstructive disease.  Echo (6/14) with EF 25-30%, prominent apical trabeculations, diffuse hypokinesis, mild Frank.  Lexiscan Cardiolite in 1/16 showed a large, severe primarily fixed inferior and apical perfusion defect with EF 25%, suggestive of prior infarct with mild peri-infarct ischemia.  4. CVA: 2004.  5. HTN 6. Gout 7. Hyperlipidemia 8. BPH 9. H/o LBBB 10. Atrial fibrillation: Paroxysmal.  Developed hypothyroidism on amiodarone and stopped it.  11. PAD: Has been followed by [facility] (Dr Darrick Penna). History of foot ulcers, now healed.   SH: Married, lives in [place], never smoked, no ETOH.   FH: Father lived into his 26s, no significant cardiac disease.   ROS: All systems reviewed and negative except as per HPI.   Current Outpatient Prescriptions  Medication Sig Dispense Refill  . amLODipine (NORVASC) 10 MG tablet Take 10 mg by mouth daily.    Mclean Kitchen atorvastatin (LIPITOR) 20 MG tablet Take 1 tablet (20 mg total) by mouth daily. 90 tablet 3  . carvedilol (COREG) 6.25 MG tablet TAKE 1 TABLET (6.25 MG TOTAL) BY MOUTH 2 (TWO) TIMES DAILY WITH A MEAL. 60 tablet 5  . cholecalciferol (VITAMIN D) 1000 UNITS tablet Take 1,000 Units by mouth daily.    . CVS VITAMIN B12 1000 MCG tablet TAKE 1 TABLET BY MOUTH DAILY 100 tablet 1  .  escitalopram (LEXAPRO) 5 MG tablet Take 1 tablet (5 mg total) by mouth daily. 30 tablet 5  . ferrous sulfate 325 (65 FE) MG tablet TAKE 1 TABLET BY MOUTH EVERY DAY 30 tablet 6  . glipiZIDE (GLUCOTROL) 5 MG tablet TAKE 1/2 TABLET BY MOUTH TWICE A DAY BEFORE A MEAL 30 tablet 11  . hydrALAZINE (APRESOLINE) 100 MG tablet Take 1 tablet (100 mg total) by mouth 3 (three) times daily. 90 tablet 6  . isosorbide mononitrate (IMDUR) 60 MG 24 hr tablet Take 2 tablets (120 mg total) by mouth daily. 180 tablet 6  . metolazone (ZAROXOLYN) 5 MG tablet Take 5 mg by mouth as needed.    . potassium chloride SA  (KLOR-CON M20) 20 MEQ tablet Take 3 tabs TWICE DAILY 180 tablet 6  . SYMBICORT 160-4.5 MCG/ACT inhaler INHALE 2 PUFFS INTO THE LUNGS 2 TIMES DAILY 10.2 g 5  . SYNTHROID 100 MCG tablet TAKE 2 TABLETS BY MOUTH EVERY MORNING BEFORE BREAKFAST 60 tablet 11  . torsemide (DEMADEX) 20 MG tablet 20 MG DAILY    . triamcinolone cream (KENALOG) 0.5 % Apply 1 application topically 2 (two) times daily as needed (for skin breakdown). 30 g 0  . warfarin (COUMADIN) 5 MG tablet TAKE 1 TABLET (5 MG TOTAL) BY MOUTH AS DIRECTED. 40 tablet 2   No current facility-administered medications for this encounter.   BP 126/68 mmHg  Pulse 57  Wt 220 lb 12.8 oz (100.154 kg)  SpO2 95% General: NAD Neck: No JVD, no thyromegaly or thyroid nodule.  Lungs: Clear to auscultation bilaterally with normal respiratory effort. CV: Nondisplaced PMI.  Heart regular S1/S2, no S3/S4, no murmur.  Trace ankle edema.  No carotid bruit.  Normal pedal pulses.  Abdomen: Soft, nontender, no hepatosplenomegaly, no distention.  Skin: Intact without lesions or rashes.  Neurologic: Alert and oriented x 3.  Psych: Normal affect. Extremities: No clubbing or cyanosis.  HEENT: Normal.   Assessment/Plan: 1. Chronic systolic CHF: EF 16-10% on 6/14 echo, 25% by 1/16 Cardiolite.  He has NYHA class III symptoms that have been waxing/waning but are generally worse over the last couple of months.  Fatigue is very prominent.  On exam, he does not appear particularly volume overloaded and per family his weight has been trending a bit downward.  He has a chronic LBBB.  Symptoms could be consistent with low cardiac output.   - I think that Frank Mclean needs a right heart cath to confirm filling pressures (does not look volume overloaded by exam) and also to assess cardiac output.  I will arrange to do this next week.  He will hold coumadin 3 days for the procedure.  Given history of CVA in 2004, I will talk to coumadin clinic about whether he will be off long  enough to need Lovenox bridge.   - Frank Mclean has chronically low EF with LBBB and wide QRS. I am going to refer him to EP for evaluation for CRT-D.  This may help a lot.  His wife sees Dr Ladona Ridgel, so I will refer to Dr Ladona Ridgel.  - Continue current dose of torsemide for now.  - Continue current Coreg, HR too low to titrate up at this point.  - No ACEI or spironolactone with CKD.  - Increase hydralazine to 100 mg tid.  Continue Imdur 120 mg daily.  - I will get an echo to assess RV and valves (none done since 6/14).  2. CAD: Patient had cardiac cath in 4/12  with mild nonobstructive CAD.  He has had no chest pain. Given increased exertional dyspnea, he had Cardiolite in 1/16.  This showed a primarily fixed large inferior and apical perfusion defect.  This likely represents prior infarction with minimal peri-infarct ischemia (versus artifact).  We talked at length about coronary angiography today. Given lack of chest pain and minimal ischemia, I have elected to hold off on coronary angiography in the setting of CKD stage III-IV.   - He is on warfarin so not on ASA.  - He will continue statin.  LDL very high in 7/15 but was off statin at that time.  Will check lipids at next appointment (goal LDL < 70).  3. Atrial fibrillation: Paroxysmal.  He is in NSR today.  Continue warfarin, Coreg. 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  This is now under control.  4. CKD: Stage III-IV.  Follows with Dr Lowell Guitar.   Followup in the office in 2 wks.   Skyy Mcknight 02/06/2015

## 2015-02-13 NOTE — Progress Notes (Signed)
Discharge instruction given per MD order. Pt and Cg able to verbalize understanding .  Pt travel to car via wheelchair

## 2015-02-13 NOTE — Progress Notes (Signed)
Site area: Rt groin/ rt fem vein Site Prior to Removal:  Level 0 Pressure Applied For:10 Manual:   yes Patient Status During Pull: a/o  Post Pull Site:  Level 0 Post Pull Instructions Given:  Pt verbalizes understanding of post instructions Post Pull Pulses Present: yes 1+ rt dp Dressing Applied:  tegaderm Bedrest begins @ 10:45:00 Comments: Pt leaves HA in stable condition. Rt groin unremarkable. Dressing is CDI.

## 2015-02-13 NOTE — Interval H&P Note (Signed)
History and Physical Interval Note:  02/13/2015 9:17 AM  Frank Mclean  has presented today for surgery, with the diagnosis of chf  The various methods of treatment have been discussed with the patient and family. After consideration of risks, benefits and other options for treatment, the patient has consented to  Procedure(s): RIGHT HEART CATH (N/A) as a surgical intervention .  [name] history has been reviewed, patient examined, no change in status, stable for surgery.  I have reviewed the patient's chart and labs.  Questions were answered to the patient's satisfaction.     Rilla Buckman [employer]

## 2015-02-13 NOTE — CV Procedure (Signed)
    Cardiac Catheterization Procedure Note  Name: Frank Mclean MRN: [id number] DOB: 08-02-1941  Procedure: Right Heart Cath  Indication: Systolic CHF, assess for low output and assess filling pressures.    Procedural Details: The right groin was prepped, draped, and anesthetized with 1% lidocaine. Using the modified Seldinger technique a 7 French sheath was placed in the right femoral vein. A Swan-Ganz catheter was used for the right heart catheterization. Standard protocol was followed for recording of right heart pressures and sampling of oxygen saturations. Fick and thermodilution cardiac output was calculated. There were no immediate procedural complications. The patient was transferred to the post catheterization recovery area for further monitoring.  Procedural Findings:  Of note: patient did not take any of his cardiac meds this morning, BP was 180/120.  I gave him IV hydralazine in an attempt to lower pressure, but BP was 170/110 during the procedure.   Hemodynamics (mmHg) RA mean 6 RV 63/7 PA 62/32, mean 46 PCWP mean 30  Oxygen saturations: PA 50% AO 97%  Cardiac Output (Fick) 4.8  Cardiac Index (Fick) 2.12 PVR 3.33 WU  Cardiac Output (Thermo) 4.08 Cardiac Index (Thermo) 1.81 PVR 3.9 WU   Final Conclusions:  Cardiac index is low, 2.1 by Fick and 1.8 by thermodilution.  He has elevated PWCP and moderate pulmonary hypertension.  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  Interestingly, right heart filling pressures are not significantly elevated.    I suspect that high systemic blood pressure during this case had an effect on the hemodynamics.  Unfortunately, he did not take his cardiac meds this morning and I was unable to lower his systemic BP effectively with IV hydralazine.    For now, I am going to ask him to go back on his home meds.  He will  restart coumadin tonight.  I will have him increase torsemide to 40 mg daily for now.  I am not going to start digoxin given significant CKD.  He needs evaluation for CRT.    [name] 02/13/2015, 9:53 AM

## 2015-02-13 NOTE — Discharge Instructions (Signed)

## 2015-02-14 LAB — POCT I-STAT 3, ART BLOOD GAS (G3+)
ACID-BASE DEFICIT: 2 mmol/L (ref 0.0–2.0)
Bicarbonate: 22.7 mEq/L (ref 20.0–24.0)
O2 Saturation: 50 %
TCO2: 24 mmol/L (ref 0–100)
pCO2 arterial: 38.7 mmHg (ref 35.0–45.0)
pH, Arterial: 7.376 (ref 7.350–7.450)
pO2, Arterial: 27 mmHg — CL (ref 80.0–100.0)

## 2015-02-17 ENCOUNTER — Ambulatory Visit (INDEPENDENT_AMBULATORY_CARE_PROVIDER_SITE_OTHER): Payer: Medicare Other | Admitting: *Deleted

## 2015-02-17 DIAGNOSIS — I639 Cerebral infarction, unspecified: Secondary | ICD-10-CM | POA: Diagnosis not present

## 2015-02-17 DIAGNOSIS — Z7901 Long term (current) use of anticoagulants: Secondary | ICD-10-CM | POA: Diagnosis not present

## 2015-02-17 DIAGNOSIS — Z5181 Encounter for therapeutic drug level monitoring: Secondary | ICD-10-CM | POA: Diagnosis not present

## 2015-02-17 DIAGNOSIS — I6350 Cerebral infarction due to unspecified occlusion or stenosis of unspecified cerebral artery: Secondary | ICD-10-CM

## 2015-02-17 LAB — POCT INR: INR: 2

## 2015-02-19 ENCOUNTER — Encounter: Payer: Self-pay | Admitting: Physician Assistant

## 2015-02-26 ENCOUNTER — Ambulatory Visit (HOSPITAL_BASED_OUTPATIENT_CLINIC_OR_DEPARTMENT_OTHER)
Admission: RE | Admit: 2015-02-26 | Discharge: 2015-02-26 | Disposition: A | Discharge status: Home / Self Care | Payer: Medicare Other | Source: Ambulatory Visit | Attending: Internal Medicine | Admitting: Internal Medicine

## 2015-02-26 ENCOUNTER — Ambulatory Visit (HOSPITAL_COMMUNITY)
Admission: RE | Admit: 2015-02-26 | Discharge: 2015-02-26 | Disposition: A | Discharge status: Home / Self Care | Payer: Medicare Other | Source: Ambulatory Visit | Attending: Internal Medicine | Admitting: Internal Medicine

## 2015-02-26 VITALS — BP 126/88 | HR 62 | Wt 221.8 lb

## 2015-02-26 DIAGNOSIS — I447 Left bundle-branch block, unspecified: Secondary | ICD-10-CM | POA: Diagnosis not present

## 2015-02-26 DIAGNOSIS — N183 Chronic kidney disease, stage 3 unspecified: Secondary | ICD-10-CM

## 2015-02-26 DIAGNOSIS — I5042 Chronic combined systolic (congestive) and diastolic (congestive) heart failure: Secondary | ICD-10-CM | POA: Diagnosis not present

## 2015-02-26 DIAGNOSIS — I509 Heart failure, unspecified: Secondary | ICD-10-CM | POA: Diagnosis not present

## 2015-02-26 DIAGNOSIS — I5022 Chronic systolic (congestive) heart failure: Secondary | ICD-10-CM | POA: Diagnosis not present

## 2015-02-26 LAB — BASIC METABOLIC PANEL
ANION GAP: 8 (ref 5–15)
BUN: 22 mg/dL (ref 6–23)
CALCIUM: 9.3 mg/dL (ref 8.4–10.5)
CO2: 28 mmol/L (ref 19–32)
CREATININE: 2.45 mg/dL — AB (ref 0.50–1.35)
Chloride: 111 mmol/L (ref 96–112)
GFR calc Af Amer: 28 mL/min — ABNORMAL LOW (ref >90)
GFR, EST NON AFRICAN AMERICAN: 24 mL/min — AB (ref >90)
Glucose, Bld: 105 mg/dL — ABNORMAL HIGH (ref 70–99)
POTASSIUM: 4.4 mmol/L (ref 3.5–5.1)
SODIUM: 147 mmol/L — AB (ref 135–145)

## 2015-02-26 NOTE — Patient Instructions (Signed)
Follow up 6 weeks.  Do the following things EVERYDAY: 1) Weigh yourself in the morning before breakfast. Write it down and keep it in a log. 2) Take your medicines as prescribed 3) Eat low salt foods-Limit salt (sodium) to 2000 mg per day.  4) Stay as active as you can everyday 5) Limit all fluids for the day to less than 2 liters  

## 2015-02-26 NOTE — Progress Notes (Signed)
Patient ID: Frank Mclean, male   DOB: [birth year], 74 y.o.   MRN: [id number]         CHF CLINIC NOTE    Date:  02/26/2015   ID:  Frank Mclean, DOB 10/12/1941, MRN [id number]  PCP:  [name], MD  Cardiologist:  Dr. Rollene Rotunda     History of Present Illness: Frank Mclean is a 74 y.o. male with a hx of chronic combined systolic and diastolic CHF, NICM (EF 25-30%), nonobstructive CAD (cath 2012), PAF, LBBB (176 ms) CAD, HTN, HL, PAD, LE venous ulcers (followed by wound clinic).  Admitted 05/2013 for AFib with RVR complicated by type II non-STEMI and a/c combined systolic and diastolic CHF. He was placed on amiodarone at that time with restoration of NSR.   Admitted 08/2013 for a/c renal failure.   He was felt to be dehydrated and he was gently hydrated with IVFsand supported with low dose dopamine.       Admitted 11/2013 with acute respiratory failure secondary to community acquired pneumonia complicated by decompensated heart failure.   Recently creatinine increasing and felt to have low output HF. Underwent RHC by Frank Mclean on 02/13/15. Unfortunately did not take his meds that day. SBP 180. Given hydralazine pre cath. Torsemide increased to  daily. Referred for possible CRT. Returns for post cath f/u. Here with his wife and brother-in-law. Remains dyspneic with mild activity. Family says he can do more but is exhausted the next day. Says he is weighing everyday at home stable at 220-222. + mild edema. Denies dizziness or CP. Wife gives him his meds.    RA mean 6 RV 63/7 PA 62/32, mean 46 PCWP mean 30 PA 50% AO 97% Cardiac Output (Fick) 4.8  Cardiac Index (Fick) 2.12 PVR 3.33 WU Cardiac Output (Thermo) 4.08 Cardiac Index (Thermo) 1.81 PVR 3.9 WU  Recent Labs: 06/24/2014: LDL (calc) 186* 08/12/2014: ALT 21 12/23/2014: TSH 6.04* 01/23/2015: Pro B Natriuretic peptide (BNP) 432.0* 02/13/2015: Creatinine 2.62*; Hemoglobin 9.8*; Potassium 4.4   Wt Readings from Last 3  Encounters:  02/26/15 221 lb 12.8 oz (100.608 kg)  02/10/15 218 lb (98.884 kg)  02/06/15 220 lb 12.8 oz (100.154 kg)     Past Medical History  Diagnosis Date  . Gout   . HTN (hypertension)   . Hyperlipidemia   . Osteoarthritis   . History of CVA (cerebrovascular accident)     Left pontine infarct July 2004; changed from Plavix to Coumadin in 2004 per MD at [facility]  . GERD (gastroesophageal reflux disease)   . DJD (degenerative joint disease)   . BPH (benign prostatic hypertrophy)   . DM (diabetes mellitus), type 2   . Peripheral vascular disease   . Bilateral leg ulcer     ACHILLES AREA--  NONHEALING  . CKD (chronic kidney disease) stage 3, GFR 30-59 ml/min   . CAD (coronary artery disease)     a. LHC 4/12: Mid LAD 25%, mid diagonal 30%, AV circumflex 40%, proximal OM 25%, distal RCA 40%  . Chronic combined systolic and diastolic heart failure     a. Echo 05/2013: EF 25-30%, diffuse HK, restrictive physiology, trivial AI, mild MR, moderate LAE, reduced RV systolic function, PASP 42  . LBBB (left bundle branch block)   . Atrial fibrillation     a. amiodarone Rx started 05/2013;  b. chronic coumadin  . NICM (nonischemic cardiomyopathy)     a. EF 25-30%.  [name] Hx of cardiovascular stress test  Nuclear Stress Test (1/16): High risk stress nuclear study with a large, severe, partially reversible inferior and apical defect consistent with prior inferior and apical infarct; mild apical ischemia; severe LVE; study high risk due to reduced LV function.  EF 25% >> reviewed with Frank Mclean >> medical mgmt    Current Outpatient Prescriptions  Medication Sig Dispense Refill  . amLODipine (NORVASC) 10 MG tablet Take 10 mg by mouth daily.    [name] atorvastatin (LIPITOR) 20 MG tablet Take 1 tablet (20 mg total) by mouth daily. 90 tablet 3  . carvedilol (COREG) 6.25 MG tablet TAKE 1 TABLET (6.25 MG TOTAL) BY MOUTH 2 (TWO) TIMES DAILY WITH A MEAL. 60 tablet 5  . cholecalciferol (VITAMIN D) 1000  UNITS tablet Take 1,000 Units by mouth daily.    . CVS VITAMIN B12 1000 MCG tablet TAKE 1 TABLET BY MOUTH DAILY 100 tablet 1  . escitalopram (LEXAPRO) 5 MG tablet Take 1 tablet (5 mg total) by mouth daily. 30 tablet 5  . ferrous sulfate 325 (65 FE) MG tablet TAKE 1 TABLET BY MOUTH EVERY DAY 30 tablet 6  . glipiZIDE (GLUCOTROL) 5 MG tablet TAKE 1/2 TABLET BY MOUTH TWICE A DAY BEFORE A MEAL 30 tablet 11  . hydrALAZINE (APRESOLINE) 100 MG tablet Take 1 tablet (100 mg total) by mouth 3 (three) times daily. 90 tablet 6  . isosorbide mononitrate (IMDUR) 60 MG 24 hr tablet Take 2 tablets (120 mg total) by mouth daily. 180 tablet 6  . metolazone (ZAROXOLYN) 5 MG tablet Take 5 mg by mouth as needed (fluid).     . potassium chloride SA (KLOR-CON M20) 20 MEQ tablet Take 3 tabs TWICE DAILY 180 tablet 6  . SYMBICORT 160-4.5 MCG/ACT inhaler INHALE 2 PUFFS INTO THE LUNGS 2 TIMES DAILY 10.2 g 5  . SYNTHROID 100 MCG tablet TAKE 2 TABLETS BY MOUTH EVERY MORNING BEFORE BREAKFAST 60 tablet 11  . torsemide (DEMADEX) 20 MG tablet 20 MG DAILY (Patient taking differently: Take 20 mg by mouth 2 (two) times daily. 20 MG DAILY)    . triamcinolone cream (KENALOG) 0.5 % Apply 1 application topically 2 (two) times daily as needed (for skin breakdown). 30 g 0  . warfarin (COUMADIN) 5 MG tablet TAKE 1 TABLET (5 MG TOTAL) BY MOUTH AS DIRECTED. 40 tablet 2   No current facility-administered medications for this encounter.    Allergies:   No Known Allergies  Social History:  The patient  reports that he quit smoking about 40 years ago. He has never used smokeless tobacco. He reports that he does not drink alcohol or use illicit drugs.    Family History:  [name] family history includes Diabetes in his mother; Gout in his other; Heart attack in his maternal grandmother; Heart disease in his father and mother; Hypertension in his father and mother; Stroke in his other. There is no history of Thyroid disease.   ROS:   Please see the history of present illness.  He notes low back pain worse with standing.   All other systems reviewed and negative.   PHYSICAL EXAM: VS:  BP 126/88 mmHg  Pulse 62  Wt 221 lb 12.8 oz (100.608 kg)  SpO2 97% Well nourished, well developed, in no acute distress HEENT: normal Neck: JVP 6   Cardiac:  normal S1, S2; RRR; no murmur; no rub+ soft  S3 Lungs:  Decreased breath sounds bilaterally, no wheezing, rhonchi or rales Abd: soft NT, nondistended  Ext: trivial bilateral LE  edema. Wounds have healed Skin: warm and dry Neuro:  CNs 2-12 intact, face is symmetrical  EKG:   NSR, HR 49, LBBB ( ) - 02/13/15   ASSESSMENT AND PLAN:  1. Acute on Chronic Combined Systolic and Diastolic CHF:   Recent RHC showed volume overload and low output in the setting of high SBP. Torsemide increased. Weight stable but volume status looks ok.    --NYHA III symptoms. We reviewed cath numbers. I think he does have low output. We talked about options. Medical therapy is currently optimized. I do not think he warrants IV inotropes at this point. Not VAD candidate given renal failure. Given wide LBBB I think there is a good chance he may benefit from CRT. We had long talk about role of CRT vs CRT-D. Has appointment with Frank Mclean next week.   2. CAD:   No angina. He is not on aspirin as he is on Coumadin. Continue statin. 3. Atrial Fibrillation:   Maintaining NSR.  He remains on coumadin and Amiodarone.  Recent TSH and LFTs ok.   4. Chronic Kidney Disease stage IV (baseline creatinine 2.6-3.0):  Follows with Frank Mclean who has suggested AV fistula but he has refused. Keep a close eye on renal function and potassium with adjustments in diuretics.  Check BMET today 5. Hypertension:   Good control today.  6. Hyperlipidemia:   Continue statin. 7. Lower Extremity Ulcers:  These have healed.   Total time spent 45 minutes. Over half that time spent discussing above.    Frank Bensimhon,MD 12:10 PM

## 2015-02-26 NOTE — Progress Notes (Signed)
Echocardiogram 2D Echocardiogram has been performed.  Davyon Fisch 02/26/2015, 12:00 PM

## 2015-03-05 ENCOUNTER — Ambulatory Visit (INDEPENDENT_AMBULATORY_CARE_PROVIDER_SITE_OTHER): Payer: Medicare Other | Admitting: Internal Medicine

## 2015-03-05 ENCOUNTER — Encounter: Payer: Self-pay | Admitting: Internal Medicine

## 2015-03-05 ENCOUNTER — Ambulatory Visit (INDEPENDENT_AMBULATORY_CARE_PROVIDER_SITE_OTHER): Payer: Medicare Other | Admitting: *Deleted

## 2015-03-05 ENCOUNTER — Encounter: Payer: Self-pay | Admitting: *Deleted

## 2015-03-05 VITALS — BP 140/78 | HR 59 | Ht 74.5 in | Wt 224.8 lb

## 2015-03-05 DIAGNOSIS — Z7901 Long term (current) use of anticoagulants: Secondary | ICD-10-CM | POA: Diagnosis not present

## 2015-03-05 DIAGNOSIS — Z01812 Encounter for preprocedural laboratory examination: Secondary | ICD-10-CM | POA: Diagnosis not present

## 2015-03-05 DIAGNOSIS — I639 Cerebral infarction, unspecified: Secondary | ICD-10-CM

## 2015-03-05 DIAGNOSIS — Z5181 Encounter for therapeutic drug level monitoring: Secondary | ICD-10-CM

## 2015-03-05 DIAGNOSIS — I5042 Chronic combined systolic (congestive) and diastolic (congestive) heart failure: Secondary | ICD-10-CM

## 2015-03-05 DIAGNOSIS — I6350 Cerebral infarction due to unspecified occlusion or stenosis of unspecified cerebral artery: Secondary | ICD-10-CM

## 2015-03-05 DIAGNOSIS — I447 Left bundle-branch block, unspecified: Secondary | ICD-10-CM

## 2015-03-05 LAB — POCT INR: INR: 2.4

## 2015-03-05 MED ORDER — TORSEMIDE 20 MG PO TABS: 20.0000 mg | ORAL_TABLET | Freq: Two times a day (BID) | ORAL | Status: DC

## 2015-03-05 MED ORDER — HYDRALAZINE HCL 100 MG PO TABS: 100.0000 mg | ORAL_TABLET | Freq: Three times a day (TID) | ORAL | Status: DC

## 2015-03-05 MED ORDER — ISOSORBIDE MONONITRATE ER 60 MG PO TB24: ORAL_TABLET | ORAL | Status: DC

## 2015-03-05 NOTE — Assessment & Plan Note (Signed)
Unclear how much of his symptoms are related to low output state or thyroid dysfunction. Will follow.

## 2015-03-05 NOTE — Assessment & Plan Note (Signed)
He appears to be maintaining NSR despite being off of the amiodarone. Will follow.

## 2015-03-05 NOTE — Progress Notes (Signed)
HPI Frank Mclean is referred today by Dr. Shirlee Latch for consideration for biv device implant. He has a long standing non-ischemic CM, chronic class 3 systolic heart failure, and LBBB with a QRS duration of 165 ms. 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 This appears to be improved and he has not yet had more atrial fib. He underwent cardiac catheterization several weeks ago which demonstrated a reduced cardiac output and elevated pressures. His right atrial pressures were normal. He has never had syncope. No Known Allergies   Current Outpatient Prescriptions  Medication Sig Dispense Refill  . amLODipine (NORVASC) 10 MG tablet Take 10 mg by mouth daily.    [name] atorvastatin (LIPITOR) 20 MG tablet Take 1 tablet (20 mg total) by mouth daily. 90 tablet 3  . carvedilol (COREG) 6.25 MG tablet TAKE 1 TABLET (6.25 MG TOTAL) BY MOUTH 2 (TWO) TIMES DAILY WITH A MEAL. 60 tablet 5  . cholecalciferol (VITAMIN D) 1000 UNITS tablet Take 1,000 Units by mouth daily.    . CVS VITAMIN B12 1000 MCG tablet TAKE 1 TABLET BY MOUTH DAILY 100 tablet 1  . escitalopram (LEXAPRO) 5 MG tablet Take 1 tablet (5 mg total) by mouth daily. 30 tablet 5  . ferrous sulfate 325 (65 FE) MG tablet TAKE 1 TABLET BY MOUTH EVERY DAY 30 tablet 6  . glipiZIDE (GLUCOTROL) 5 MG tablet TAKE 1/2 TABLET BY MOUTH TWICE A DAY BEFORE A MEAL 30 tablet 11  . hydrALAZINE (APRESOLINE) 100 MG tablet Take 1 tablet (100 mg total) by mouth 3 (three) times daily. 90 tablet 6  . isosorbide mononitrate (IMDUR) 60 MG 24 hr tablet Take 90 mg by mouth daily. Take 1.5 tablets for 90 mg by mouth twice a day.    . metolazone (ZAROXOLYN) 5 MG tablet Take 5 mg by mouth as needed (fluid).     . potassium chloride SA (KLOR-CON M20) 20 MEQ tablet Take 3 tabs TWICE DAILY 180 tablet 6  . SYMBICORT 160-4.5 MCG/ACT inhaler INHALE 2 PUFFS INTO THE LUNGS 2 TIMES DAILY 10.2 g 5  . SYNTHROID 100 MCG tablet TAKE 2 TABLETS BY MOUTH EVERY MORNING BEFORE BREAKFAST  60 tablet 11  . torsemide (DEMADEX) 20 MG tablet Take 20 mg by mouth 2 (two) times daily.    [name] triamcinolone cream (KENALOG) 0.5 % Apply 1 application topically 2 (two) times daily as needed (for skin breakdown). 30 g 0  . warfarin (COUMADIN) 5 MG tablet TAKE 1 TABLET (5 MG TOTAL) BY MOUTH AS DIRECTED. 40 tablet 2   No current facility-administered medications for this visit.     Past Medical History  Diagnosis Date  . Gout   . HTN (hypertension)   . Hyperlipidemia   . Osteoarthritis   . History of CVA (cerebrovascular accident)     Left pontine infarct July 2004; changed from Plavix to Coumadin in 2004 per MD at [facility]  . GERD (gastroesophageal reflux disease)   . DJD (degenerative joint disease)   . BPH (benign prostatic hypertrophy)   . DM (diabetes mellitus), type 2   . Peripheral vascular disease   . Bilateral leg ulcer     ACHILLES AREA--  NONHEALING  . CKD (chronic kidney disease) stage 3, GFR 30-59 ml/min   . CAD (coronary artery disease)     a. LHC 4/12: Mid LAD 25%, mid diagonal 30%, AV circumflex 40%, proximal OM 25%, distal RCA 40%  . Chronic combined systolic and diastolic heart failure  a. Echo 05/2013: EF 25-30%, diffuse HK, restrictive physiology, trivial AI, mild MR, moderate LAE, reduced RV systolic function, PASP 42  . LBBB (left bundle branch block)   . Atrial fibrillation     a. amiodarone Rx started 05/2013;  b. chronic coumadin  . NICM (nonischemic cardiomyopathy)     a. EF 25-30%.  [name] Hx of cardiovascular stress test     Nuclear Stress Test (1/16): High risk stress nuclear study with a large, severe, partially reversible inferior and apical defect consistent with prior inferior and apical infarct; mild apical ischemia; severe LVE; study high risk due to reduced LV function.  EF 25% >> reviewed with Dr. Antoine Poche >> medical mgmt    ROS:   All systems reviewed and negative except as noted in the HPI.   Past Surgical History  Procedure Laterality Date   . Cardiac catheterization  04-16-2011   DR [facility]    NON-OBSTRUCTIVE CAD. MILDLY ELEVATED PULMONARY PRESSURES/ ELEVATED  END-DIASTOLIC PRESSURE  . Transthoracic echocardiogram  12-31-2010    MODERATE CONCENTRIC LVH/ SYSTOLIC FUNCTION SEVERELY REDUCED/ EF 25-30%/  SEVERE HYPOKINESIS OF ANTEROSEPTAL MYOCARDIUM  AND ENTIREAPICAL MYOCARDIUM /  MODERATE HYPOKINESIS OF LATERAL, INFEROLATERAL, INFERIOR,AND INFEROSEPTAL MYOCARIUM/  GRADE 3 DIASTOLIC DYSFUNCTION/ MILD MR  . Aortogram w/ bilateral lower extremitiy runoff  05-18-2013  DR FIELDS    LEFT LEG OCCLUDED PERONEAL AND ANTERIOR TIBIAL ARTERIES/ HIGH GRADE STENOIS 80% MIDDLE AND DISTAL THIRD OF POSTERIOR TIBIAL ARTERY/ RIGHT PERONEAL AND ANTERIOR TIBIAL ARTERY OCCLUDED/ 40% STENOSIS DISTALLY   . Abdominal aortagram N/A 05/18/2013    Procedure: ABDOMINAL Ronny Flurry;  Surgeon: Sherren Kerns, MD;  [facility] C[facility] LAB;  Service: Cardiovascular;  Laterality: N/A;  . Lower extremity angiogram Bilateral 05/18/2013    Procedure: LOWER EXTREMITY ANGIOGRAM;  Surgeon: Sherren Kerns, MD;  Location: [facility] CATH LAB;  Service: Cardiovascular;  Laterality: Bilateral;  . Right heart catheterization N/A 02/13/2015    Procedure: RIGHT HEART CATH;  Surgeon: Laurey Morale, MD;  Location[facility] CATH LAB;  Service: Cardiovascular;  Laterality: N/A;     Family History  Problem Relation Age of Onset  . Hypertension Mother   . Heart disease Mother   . Diabetes Mother   . Gout Other   . Stroke Other   . Hypertension Father   . Heart disease Father   . Heart attack Maternal Grandmother   . Thyroid disease Neg Hx      History   Social History  . Marital Status: Married    Spouse Name: N/A  . Number of Children: N/A  . Years of Education: N/A   Occupational History  . Retired - Working in Product manager    Social History Main Topics  . Smoking status: Former Smoker    Quit date: 04/12/1974  . Smokeless tobacco: Never Used  . Alcohol Use: No     Comment:  previously drank - "love cognac" quit 15 yrs ago.  . Drug Use: No  . Sexual Activity: No   Other Topics Concern  . Not on file   Social History Narrative   Worked at [employer] year.  He is married and lives with his wife [name] in [place].  Has one daughter Lowella Bandy     BP 140/78 mmHg  Pulse 59  Ht 6' 2.5" (1.892 m)  Wt 224 lb 12.8 oz (101.969 kg)  BMI 28.49 kg/m2  Physical Exam:  stable appearing 74 yo man, NAD HEENT: Unremarkable Neck:  7 cm JVD, no  thyromegally Lymphatics:  No adenopathy Back:  No CVA tenderness Lungs:  Clear except for basilar rales HEART:  Regular rate rhythm, no murmurs, no rubs, no clicks Abd:  soft, positive bowel sounds, no organomegally, no rebound, no guarding Ext:  2 plus pulses, no edema, no cyanosis, no clubbing Skin:  No rashes no nodules Neuro:  CN II through XII intact, motor grossly intact   Assess/Plan:

## 2015-03-05 NOTE — Patient Instructions (Addendum)
Your physician recommends that you continue on your current medications as directed. Please refer to the Current Medication list given to you today.  Pre procedure labs on: 03/27/15  Your wound check is scheduled for 04/14/15 at 11:00 a.m. at [address]   Please hold your Coumadin the day before your procedure.  Thank you for choosing [facility]!!

## 2015-03-05 NOTE — Assessment & Plan Note (Signed)
He has chronic systolic heart failure and LBBB on maximal medical therapy. I have discussed the treatment options with the patient and his family. BiV PPM vs BiV ICD were reviewed in detail. He would like to proceed with implant but is not sure which device he would like. I will plan to discuss with Dr. [facility] about ICD vs PPM (BiV).

## 2015-03-05 NOTE — Assessment & Plan Note (Signed)
His blood pressure is not elevated. Will follow. He will continue his current meds and is encouraged to maintain a low sodium diet.

## 2015-03-06 ENCOUNTER — Other Ambulatory Visit: Payer: Self-pay | Admitting: *Deleted

## 2015-03-06 DIAGNOSIS — I509 Heart failure, unspecified: Secondary | ICD-10-CM

## 2015-03-10 ENCOUNTER — Telehealth: Payer: Self-pay | Admitting: Internal Medicine

## 2015-03-10 ENCOUNTER — Inpatient Hospital Stay (HOSPITAL_COMMUNITY)
Admission: EM | Admit: 2015-03-10 | Discharge: 2015-03-13 | DRG: 227 | Disposition: A | Discharge status: Home Health | Payer: Medicare Other | Attending: Cardiology | Admitting: Cardiology

## 2015-03-10 ENCOUNTER — Encounter (HOSPITAL_COMMUNITY): Payer: Self-pay | Admitting: Emergency Medicine

## 2015-03-10 ENCOUNTER — Emergency Department (HOSPITAL_COMMUNITY): Payer: Medicare Other

## 2015-03-10 DIAGNOSIS — Z8673 Personal history of transient ischemic attack (TIA), and cerebral infarction without residual deficits: Secondary | ICD-10-CM

## 2015-03-10 DIAGNOSIS — E440 Moderate protein-calorie malnutrition: Secondary | ICD-10-CM | POA: Diagnosis not present

## 2015-03-10 DIAGNOSIS — I739 Peripheral vascular disease, unspecified: Secondary | ICD-10-CM | POA: Diagnosis present

## 2015-03-10 DIAGNOSIS — Z950 Presence of cardiac pacemaker: Secondary | ICD-10-CM | POA: Diagnosis not present

## 2015-03-10 DIAGNOSIS — I5022 Chronic systolic (congestive) heart failure: Secondary | ICD-10-CM | POA: Diagnosis not present

## 2015-03-10 DIAGNOSIS — R413 Other amnesia: Secondary | ICD-10-CM | POA: Diagnosis present

## 2015-03-10 DIAGNOSIS — R0602 Shortness of breath: Secondary | ICD-10-CM

## 2015-03-10 DIAGNOSIS — E785 Hyperlipidemia, unspecified: Secondary | ICD-10-CM | POA: Diagnosis present

## 2015-03-10 DIAGNOSIS — Z7951 Long term (current) use of inhaled steroids: Secondary | ICD-10-CM | POA: Diagnosis not present

## 2015-03-10 DIAGNOSIS — E1122 Type 2 diabetes mellitus with diabetic chronic kidney disease: Secondary | ICD-10-CM | POA: Diagnosis present

## 2015-03-10 DIAGNOSIS — Z87891 Personal history of nicotine dependence: Secondary | ICD-10-CM | POA: Diagnosis not present

## 2015-03-10 DIAGNOSIS — R404 Transient alteration of awareness: Secondary | ICD-10-CM | POA: Diagnosis not present

## 2015-03-10 DIAGNOSIS — R531 Weakness: Secondary | ICD-10-CM | POA: Diagnosis not present

## 2015-03-10 DIAGNOSIS — I447 Left bundle-branch block, unspecified: Secondary | ICD-10-CM | POA: Diagnosis present

## 2015-03-10 DIAGNOSIS — Z823 Family history of stroke: Secondary | ICD-10-CM

## 2015-03-10 DIAGNOSIS — I10 Essential (primary) hypertension: Secondary | ICD-10-CM | POA: Diagnosis present

## 2015-03-10 DIAGNOSIS — I255 Ischemic cardiomyopathy: Secondary | ICD-10-CM | POA: Diagnosis not present

## 2015-03-10 DIAGNOSIS — Z8249 Family history of ischemic heart disease and other diseases of the circulatory system: Secondary | ICD-10-CM | POA: Diagnosis not present

## 2015-03-10 DIAGNOSIS — I480 Paroxysmal atrial fibrillation: Secondary | ICD-10-CM | POA: Diagnosis present

## 2015-03-10 DIAGNOSIS — I2510 Atherosclerotic heart disease of native coronary artery without angina pectoris: Secondary | ICD-10-CM | POA: Diagnosis present

## 2015-03-10 DIAGNOSIS — A419 Sepsis, unspecified organism: Secondary | ICD-10-CM | POA: Diagnosis not present

## 2015-03-10 DIAGNOSIS — I428 Other cardiomyopathies: Secondary | ICD-10-CM | POA: Diagnosis present

## 2015-03-10 DIAGNOSIS — I129 Hypertensive chronic kidney disease with stage 1 through stage 4 chronic kidney disease, or unspecified chronic kidney disease: Secondary | ICD-10-CM | POA: Diagnosis present

## 2015-03-10 DIAGNOSIS — I5023 Acute on chronic systolic (congestive) heart failure: Secondary | ICD-10-CM | POA: Diagnosis present

## 2015-03-10 DIAGNOSIS — I5042 Chronic combined systolic (congestive) and diastolic (congestive) heart failure: Secondary | ICD-10-CM

## 2015-03-10 DIAGNOSIS — I5043 Acute on chronic combined systolic (congestive) and diastolic (congestive) heart failure: Principal | ICD-10-CM | POA: Diagnosis present

## 2015-03-10 DIAGNOSIS — Z7901 Long term (current) use of anticoagulants: Secondary | ICD-10-CM

## 2015-03-10 DIAGNOSIS — E039 Hypothyroidism, unspecified: Secondary | ICD-10-CM | POA: Diagnosis present

## 2015-03-10 DIAGNOSIS — Z95810 Presence of automatic (implantable) cardiac defibrillator: Secondary | ICD-10-CM

## 2015-03-10 DIAGNOSIS — Z79899 Other long term (current) drug therapy: Secondary | ICD-10-CM | POA: Diagnosis not present

## 2015-03-10 DIAGNOSIS — Z9981 Dependence on supplemental oxygen: Secondary | ICD-10-CM

## 2015-03-10 DIAGNOSIS — R4182 Altered mental status, unspecified: Secondary | ICD-10-CM | POA: Diagnosis not present

## 2015-03-10 DIAGNOSIS — J189 Pneumonia, unspecified organism: Secondary | ICD-10-CM | POA: Diagnosis not present

## 2015-03-10 DIAGNOSIS — J9809 Other diseases of bronchus, not elsewhere classified: Secondary | ICD-10-CM | POA: Diagnosis not present

## 2015-03-10 DIAGNOSIS — N183 Chronic kidney disease, stage 3 unspecified: Secondary | ICD-10-CM | POA: Diagnosis present

## 2015-03-10 DIAGNOSIS — E870 Hyperosmolality and hypernatremia: Secondary | ICD-10-CM | POA: Diagnosis not present

## 2015-03-10 DIAGNOSIS — I509 Heart failure, unspecified: Secondary | ICD-10-CM | POA: Diagnosis not present

## 2015-03-10 DIAGNOSIS — N184 Chronic kidney disease, stage 4 (severe): Secondary | ICD-10-CM | POA: Diagnosis not present

## 2015-03-10 DIAGNOSIS — K219 Gastro-esophageal reflux disease without esophagitis: Secondary | ICD-10-CM | POA: Diagnosis present

## 2015-03-10 DIAGNOSIS — E1129 Type 2 diabetes mellitus with other diabetic kidney complication: Secondary | ICD-10-CM | POA: Diagnosis present

## 2015-03-10 DIAGNOSIS — Z833 Family history of diabetes mellitus: Secondary | ICD-10-CM

## 2015-03-10 DIAGNOSIS — I517 Cardiomegaly: Secondary | ICD-10-CM | POA: Diagnosis not present

## 2015-03-10 DIAGNOSIS — R600 Localized edema: Secondary | ICD-10-CM | POA: Diagnosis not present

## 2015-03-10 HISTORY — DX: Hypothyroidism, unspecified: E03.9

## 2015-03-10 HISTORY — DX: Paroxysmal atrial fibrillation: I48.0

## 2015-03-10 LAB — CBC WITH DIFFERENTIAL/PLATELET
BASOS PCT: 0 % (ref 0–1)
Basophils Absolute: 0 10*3/uL (ref 0.0–0.1)
Eosinophils Absolute: 0.1 10*3/uL (ref 0.0–0.7)
Eosinophils Relative: 1 % (ref 0–5)
HCT: 35.4 % — ABNORMAL LOW (ref 39.0–52.0)
HEMOGLOBIN: 10.7 g/dL — AB (ref 13.0–17.0)
LYMPHS ABS: 0.7 10*3/uL (ref 0.7–4.0)
Lymphocytes Relative: 8 % — ABNORMAL LOW (ref 12–46)
MCH: 26.4 pg (ref 26.0–34.0)
MCHC: 30.2 g/dL (ref 30.0–36.0)
MCV: 87.4 fL (ref 78.0–100.0)
MONO ABS: 0.5 10*3/uL (ref 0.1–1.0)
Monocytes Relative: 5 % (ref 3–12)
Neutro Abs: 8.2 10*3/uL — ABNORMAL HIGH (ref 1.7–7.7)
Neutrophils Relative %: 86 % — ABNORMAL HIGH (ref 43–77)
Platelets: 256 10*3/uL (ref 150–400)
RBC: 4.05 MIL/uL — AB (ref 4.22–5.81)
RDW: 15.8 % — ABNORMAL HIGH (ref 11.5–15.5)
WBC: 9.5 10*3/uL (ref 4.0–10.5)

## 2015-03-10 LAB — PROTIME-INR
INR: 1.83 — ABNORMAL HIGH (ref 0.00–1.49)
Prothrombin Time: 21.4 seconds — ABNORMAL HIGH (ref 11.6–15.2)

## 2015-03-10 LAB — I-STAT TROPONIN, ED: Troponin i, poc: 0.05 ng/mL (ref 0.00–0.08)

## 2015-03-10 LAB — BASIC METABOLIC PANEL
Anion gap: 8 (ref 5–15)
BUN: 22 mg/dL (ref 6–23)
CO2: 29 mmol/L (ref 19–32)
Calcium: 9.1 mg/dL (ref 8.4–10.5)
Chloride: 110 mmol/L (ref 96–112)
Creatinine, Ser: 2.46 mg/dL — ABNORMAL HIGH (ref 0.50–1.35)
GFR calc Af Amer: 28 mL/min — ABNORMAL LOW (ref >90)
GFR calc non Af Amer: 24 mL/min — ABNORMAL LOW (ref >90)
Glucose, Bld: 89 mg/dL (ref 70–99)
Potassium: 4 mmol/L (ref 3.5–5.1)
Sodium: 147 mmol/L — ABNORMAL HIGH (ref 135–145)

## 2015-03-10 LAB — BRAIN NATRIURETIC PEPTIDE: B Natriuretic Peptide: 825.5 pg/mL — ABNORMAL HIGH (ref 0.0–100.0)

## 2015-03-10 NOTE — Telephone Encounter (Signed)
Spoke with patient's wife. She reports he wanted her to call in this AM r/t SOB.   Complaints 1. Feels more SOB than usual - since woke up at 5am 2. Uses home O2 - usually 2L - patient turned it up to 2.5L  Per wife, weights have been stable - around 222 lbs - patient weight while on the phone and was 216 lbs Patient denies edema/chest pain/lightheadedness/dizziness  Per wife, patient sleeps with 2 pillows and O2 at night and slept fine with no complaints - just woke up this AM feeling SOB  Per wife, patient is scheduled to have BiV-ICD in April with Dr. Ladona Ridgel and wife is to leave town this week >> wife thinks patient is anxious about these upcoming situations.   Will defer to DOD to review and advise on symptoms

## 2015-03-10 NOTE — Telephone Encounter (Signed)
New message     Pt c/o Shortness Of Breath: STAT if SOB developed within the last 24 hours or pt is noticeably SOB on the phone  1. Are you currently SOB (can you hear that pt is SOB on the phone)?  Wife is on the phone  2. How long have you been experiencing SOB? Since 5am 3. Are you SOB when sitting or when up moving around?  Both--sob all of the time since 5am 4. Are you currently experiencing any other symptoms? no Pt is scheduled to have a pacemaker inserted in April.  He uses oxygen, but he has been worse since 5am.

## 2015-03-10 NOTE — ED Provider Notes (Signed)
CSN: [id number]     Arrival date & time 03/10/15  2009 History   First MD Initiated Contact with Patient 03/10/15 2030     Chief Complaint  Patient presents with  . Shortness of Breath     (Consider location/radiation/quality/duration/timing/severity/associated sxs/prior Treatment) The history is provided by the patient. No language interpreter was used.   Mr. Buda is a 74 year old black male with a history of hypertension, CHF, a-fib, DM, and CKD who presents for increased shortness of breath that began 2 days ago.  His wife states he woke up this morning with retractions and anxiety due to difficulty breathing.  Nothing makes it better or worse.  He does not exert himself more than going from the bathroom to the bedroom.  He is on 2L of oxygen at night but not during the day.  Today he required oxygen during the day.  He denies any fever, chest pain, abdominal pain, nausea, vomiting, or increased leg swelling.  Past Medical History  Diagnosis Date  . Gout   . HTN (hypertension)   . Hyperlipidemia   . Osteoarthritis   . History of CVA (cerebrovascular accident)     Left pontine infarct July 2004; changed from Plavix to Coumadin in 2004 per MD at [facility]  . GERD (gastroesophageal reflux disease)   . DJD (degenerative joint disease)   . BPH (benign prostatic hypertrophy)   . Peripheral vascular disease   . Bilateral leg ulcer     ACHILLES AREA--  NONHEALING  . CKD (chronic kidney disease) stage 3, GFR 30-59 ml/min   . CAD (coronary artery disease)     a. LHC 4/12: Mid LAD 25%, mid diagonal 30%, AV circumflex 40%, proximal OM 25%, distal RCA 40%  . Chronic combined systolic and diastolic heart failure     a. Echo 05/2013: EF 25-30%, diffuse HK, restrictive physiology, trivial AI, mild MR, moderate LAE, reduced RV systolic function, PASP 42  . LBBB (left bundle branch block)   . Atrial fibrillation     a. amiodarone Rx started 05/2013;  b. chronic coumadin  . NICM (nonischemic  cardiomyopathy)     a. EF 25-30%.  [name] Hx of cardiovascular stress test     Nuclear Stress Test (1/16): High risk stress nuclear study with a large, severe, partially reversible inferior and apical defect consistent with prior inferior and apical infarct; mild apical ischemia; severe LVE; study high risk due to reduced LV function.  EF 25% >> reviewed with Dr. Antoine Poche >> medical mgmt  . CHF (congestive heart failure)   . Stroke   . Hypothyroidism   . DM (diabetes mellitus), type 2    Past Surgical History  Procedure Laterality Date  . Cardiac catheterization  04-16-2011   DR [facility]    NON-OBSTRUCTIVE CAD. MILDLY ELEVATED PULMONARY PRESSURES/ ELEVATED  END-DIASTOLIC PRESSURE  . Transthoracic echocardiogram  12-31-2010    MODERATE CONCENTRIC LVH/ SYSTOLIC FUNCTION SEVERELY REDUCED/ EF 25-30%/  SEVERE HYPOKINESIS OF ANTEROSEPTAL MYOCARDIUM  AND ENTIREAPICAL MYOCARDIUM /  MODERATE HYPOKINESIS OF LATERAL, INFEROLATERAL, INFERIOR,AND INFEROSEPTAL MYOCARIUM/  GRADE 3 DIASTOLIC DYSFUNCTION/ MILD MR  . Aortogram w/ bilateral lower extremitiy runoff  05-18-2013  DR FIELDS    LEFT LEG OCCLUDED PERONEAL AND ANTERIOR TIBIAL ARTERIES/ HIGH GRADE STENOIS 80% MIDDLE AND DISTAL THIRD OF POSTERIOR TIBIAL ARTERY/ RIGHT PERONEAL AND ANTERIOR TIBIAL ARTERY OCCLUDED/ 40% STENOSIS DISTALLY   . Abdominal aortagram N/A 05/18/2013    Procedure: ABDOMINAL Ronny Flurry;  Surgeon: Sherren Kerns, MD;  Location[facility]  CATH LAB;  Service: Cardiovascular;  Laterality: N/A;  . Lower extremity angiogram Bilateral 05/18/2013    Procedure: LOWER EXTREMITY ANGIOGRAM;  Surgeon: Sherren Kerns, MD;  Location: [facility] CATH LAB;  Service: Cardiovascular;  Laterality: Bilateral;  . Right heart catheterization N/A 02/13/2015    Procedure: RIGHT HEART CATH;  Surgeon: Laurey Morale, MD;  Location: [facility] CATH LAB;  Service: Cardiovascular;  Laterality: N/A;   Family History  Problem Relation Age of Onset  . Hypertension Mother   . Heart  disease Mother   . Diabetes Mother   . Gout Other   . Stroke Other   . Hypertension Father   . Heart disease Father   . Heart attack Maternal Grandmother   . Thyroid disease Neg Hx    History  Substance Use Topics  . Smoking status: Former Smoker    Quit date: 04/12/1974  . Smokeless tobacco: Never Used  . Alcohol Use: No     Comment: previously drank - "love cognac" quit 15 yrs ago.    Review of Systems  Constitutional: Positive for chills.  Respiratory: Negative for cough and wheezing.   Neurological: Negative for dizziness and syncope.  All other systems reviewed and are negative.     Allergies  Levothyroxine sodium; Chicken protein; and Eggs or egg-derived products  Home Medications   Prior to Admission medications   Medication Sig Start Date End Date Taking? Authorizing Provider  amLODipine (NORVASC) 10 MG tablet Take 10 mg by mouth daily.   Yes Historical Provider, MD  atorvastatin (LIPITOR) 20 MG tablet Take 1 tablet (20 mg total) by mouth daily. 01/16/15  Yes Scott T Alben Spittle, PA-C  carvedilol (COREG) 6.25 MG tablet TAKE 1 TABLET (6.25 MG TOTAL) BY MOUTH 2 (TWO) TIMES DAILY WITH A MEAL. 08/20/14  Yes Aleksei Plotnikov V, MD  cholecalciferol (VITAMIN D) 1000 UNITS tablet Take 1,000 Units by mouth daily.   Yes Historical Provider, MD  CVS VITAMIN B12 1000 MCG tablet TAKE 1 TABLET BY MOUTH DAILY 01/27/15  Yes Aleksei Plotnikov V, MD  escitalopram (LEXAPRO) 5 MG tablet Take 1 tablet (5 mg total) by mouth daily. Patient taking differently: Take 5 mg by mouth at bedtime.  02/03/15  Yes Aleksei Plotnikov V, MD  ferrous sulfate 325 (65 FE) MG tablet TAKE 1 TABLET BY MOUTH EVERY DAY 11/19/14  Yes Aleksei Plotnikov V, MD  glipiZIDE (GLUCOTROL) 5 MG tablet TAKE 1/2 TABLET BY MOUTH TWICE A DAY BEFORE A MEAL 01/27/15  Yes Aleksei Plotnikov V, MD  hydrALAZINE (APRESOLINE) 100 MG tablet Take 1 tablet (100 mg total) by mouth 3 (three) times daily. 03/05/15  Yes Marinus Maw, MD  isosorbide  mononitrate (IMDUR) 60 MG 24 hr tablet Take 1.5 tablets for 90 mg by mouth twice a day. Patient taking differently: Take 90 mg by mouth 2 (two) times daily. Take 1.5 tablets for 90 mg by mouth twice a day. 03/05/15  Yes Marinus Maw, MD  metolazone (ZAROXOLYN) 5 MG tablet Take 5 mg by mouth as needed (fluid).  08/12/14  Yes Rollene Rotunda, MD  OXYGEN Inhale 2.5 L into the lungs See admin instructions. USES OXYGEN EVERY BEDTIME USES DURING DAY ONLY AS NEEDED   Yes Historical Provider, MD  potassium chloride SA (KLOR-CON M20) 20 MEQ tablet Take 3 tabs TWICE DAILY Patient taking differently: 60 mEq 2 (two) times daily. Take 3 tabs TWICE DAILY 01/27/15  Yes Rollene Rotunda, MD  SYMBICORT 160-4.5 MCG/ACT inhaler INHALE 2 PUFFS INTO THE LUNGS  2 TIMES DAILY 11/28/14  Yes Aleksei Plotnikov V, MD  SYNTHROID 100 MCG tablet TAKE 2 TABLETS BY MOUTH EVERY MORNING BEFORE BREAKFAST 01/27/15  Yes Aleksei Plotnikov V, MD  torsemide (DEMADEX) 20 MG tablet Take 1 tablet (20 mg total) by mouth 2 (two) times daily. 03/05/15  Yes Marinus Maw, MD  warfarin (COUMADIN) 5 MG tablet TAKE 1 TABLET (5 MG TOTAL) BY MOUTH AS DIRECTED. 01/17/15  Yes Aleksei Plotnikov V, MD   BP 133/80 mmHg  Pulse 51  Temp(Src) 97.7 F (36.5 C) (Oral)  Resp 18  Ht 6\' 2"  (1.88 m)  Wt 214 lb (97.07 kg)  BMI 27.46 kg/m2  SpO2 100% Physical Exam  Constitutional: He is oriented to person, place, and time. He appears well-developed and well-nourished.  HENT:  Head: Normocephalic and atraumatic.  Eyes: Conjunctivae are normal.  Neck: Normal range of motion. Neck supple.  Cardiovascular: Normal rate and regular rhythm.   Murmur heard. Pulmonary/Chest: Effort normal and breath sounds normal. No respiratory distress. He has no decreased breath sounds. He has no wheezes. He has no rales.  Abdominal: Soft. There is no tenderness.  Musculoskeletal: Normal range of motion.  Mild bilateral lower extremity edema.   Neurological: He is alert and  oriented to person, place, and time.  Skin: Skin is warm and dry.  Nursing note and vitals reviewed.   ED Course  Procedures (including critical care time) Labs Review Labs Reviewed  CBC WITH DIFFERENTIAL/PLATELET - Abnormal; Notable for the following:    RBC 4.05 (*)    Hemoglobin 10.7 (*)    HCT 35.4 (*)    RDW 15.8 (*)    Neutrophils Relative % 86 (*)    Neutro Abs 8.2 (*)    Lymphocytes Relative 8 (*)    All other components within normal limits  BRAIN NATRIURETIC PEPTIDE - Abnormal; Notable for the following:    B Natriuretic Peptide 825.5 (*)    All other components within normal limits  BASIC METABOLIC PANEL - Abnormal; Notable for the following:    Sodium 147 (*)    Creatinine, Ser 2.46 (*)    GFR calc non Af Amer 24 (*)    GFR calc Af Amer 28 (*)    All other components within normal limits  PROTIME-INR - Abnormal; Notable for the following:    Prothrombin Time 21.4 (*)    INR 1.83 (*)    All other components within normal limits  PROTIME-INR - Abnormal; Notable for the following:    Prothrombin Time 21.9 (*)    INR 1.89 (*)    All other components within normal limits  I-STAT TROPOININ, ED    Imaging Review Dg Chest 2 View  03/10/2015   CLINICAL DATA:  Acute onset of shortness of breath. Initial encounter.  EXAM: CHEST  2 VIEW  COMPARISON:  Chest radiograph performed 11/27/2013  FINDINGS: The lungs are well-aerated. Vascular congestion is noted, with bilateral central airspace opacities and a small left pleural effusion, likely reflecting mild recurrent pulmonary edema. No pneumothorax is seen.  The heart is enlarged.  No acute osseous abnormalities are seen.  IMPRESSION: Vascular congestion and cardiomegaly, with bilateral central airspace opacities and a small left pleural effusion, likely reflecting mild recurrent pulmonary edema.   Electronically Signed   By: Roanna Raider M.D.   On: 03/10/2015 22:54     EKG Interpretation   Date/Time:  Monday March 10 2015 20:18:57 EDT Ventricular Rate:  63 PR Interval:  175 QRS  Duration: 185 QT Interval:  522 QTC Calculation: 534 R Axis:   -72 Text Interpretation:  Sinus rhythm Consider left atrial enlargement  Nonspecific IVCD with LAD Left ventricular hypertrophy Borderline T  abnormalities, lateral leads Confirmed by Rubin Payor  MD, Harrold Donath [phone]) on  03/10/2015 9:30:22 PM      MDM   Final diagnoses:  Shortness of breath  His ECHO on 02/26/15 revealed mild LVH w/moderate to severe reduced systolic function.  The estimated ejection fraction was in the range of 30% to 35%. Decreased left ventricular diastolic compliance and/or increased left atrial pressure (grade 3 diastolic dysfunction). Mitral valve has moderate regurgitation. Right ventricle was moderately dilated and systolic function was moderately reduced. Pulmonary arteries showed systolic pressure was moderately increased.  Per Dr. Sharlot Gowda Taylor's note on 3/16. Mr. Kaeding was referred by Dr. Shirlee Latch for consideration for biv device implant. He has a long standing non-ischemic CM, chronic class 3 systolic heart failure, and LBBB with a QRS duration of 165 ms. 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 This appears to be improved and he has not yet had more atrial fib. He underwent cardiac catheterization several weeks ago which demonstrated a reduced cardiac output and elevated pressures. His right atrial pressures were normal. He has never had syncope.  He is scheduled for a biventricular pacemaker on 04/03/15.  He is here for increased shortness of breath.  No chest pain, no increased leg swelling. He is on 1L of oxygen in the room. His exam was normal except that he has some mild bilateral lower extremity edema which his wife says is normal.   His BNP is 825.  Creatinine is baseline. Troponin is negative and chest xray shows small left pulmonary effusion. His wife is nervous about him coming home and her taking care of him.  She feels  that he will get worse at home.    I discussed this with Dr. Rubin Payor who agrees to speak with the hospitalist, Dr. Clyde Lundborg regarding admission.       Catha Gosselin, PA-C 03/11/15 1536  Benjiman Core, MD 03/14/15 [phone]

## 2015-03-10 NOTE — Telephone Encounter (Signed)
Not really a good DOD question - I do not know the patient & he sounds complicated.  Will defer to Dr. Ladona Ridgel or Dr. Shirlee Latch (since Dr. Antoine Poche is out)  [facility]

## 2015-03-10 NOTE — ED Notes (Signed)
Pt here via EMS. Pt states he has had several episodes of SOB that started this past Saturday. Pt states he got up to go the bathroom and "could hardly breathe". Pt denies any SOB on arrival to ED. Pt denies any pain or other s/s during triage.  Pt a/o x 4 on arrival to ED.

## 2015-03-11 ENCOUNTER — Other Ambulatory Visit (HOSPITAL_COMMUNITY): Payer: Self-pay

## 2015-03-11 ENCOUNTER — Encounter (HOSPITAL_COMMUNITY): Payer: Self-pay | Admitting: General Practice

## 2015-03-11 DIAGNOSIS — I5023 Acute on chronic systolic (congestive) heart failure: Secondary | ICD-10-CM

## 2015-03-11 DIAGNOSIS — N184 Chronic kidney disease, stage 4 (severe): Secondary | ICD-10-CM | POA: Diagnosis not present

## 2015-03-11 DIAGNOSIS — I4891 Unspecified atrial fibrillation: Secondary | ICD-10-CM

## 2015-03-11 LAB — GLUCOSE, CAPILLARY
GLUCOSE-CAPILLARY: 84 mg/dL (ref 70–99)
GLUCOSE-CAPILLARY: 98 mg/dL (ref 70–99)

## 2015-03-11 LAB — PROTIME-INR
INR: 1.89 — AB (ref 0.00–1.49)
PROTHROMBIN TIME: 21.9 s — AB (ref 11.6–15.2)

## 2015-03-11 MED ORDER — SODIUM CHLORIDE 0.9 % IJ SOLN
3.0000 mL | INTRAMUSCULAR | Status: DC | PRN
  Administered 2015-03-11: 3 mL via INTRAVENOUS
  Filled 2015-03-11: qty 3

## 2015-03-11 MED ORDER — POTASSIUM CHLORIDE CRYS ER 20 MEQ PO TBCR
60.0000 meq | EXTENDED_RELEASE_TABLET | Freq: Two times a day (BID) | ORAL | Status: DC
  Administered 2015-03-11 – 2015-03-13 (×5): 60 meq via ORAL
  Filled 2015-03-11 (×5): qty 3

## 2015-03-11 MED ORDER — SODIUM CHLORIDE 0.9 % IJ SOLN
3.0000 mL | Freq: Two times a day (BID) | INTRAMUSCULAR | Status: DC
  Administered 2015-03-11 – 2015-03-12 (×4): 3 mL via INTRAVENOUS
  Filled 2015-03-11: qty 3

## 2015-03-11 MED ORDER — ATORVASTATIN CALCIUM 20 MG PO TABS
20.0000 mg | ORAL_TABLET | Freq: Every day | ORAL | Status: DC
  Administered 2015-03-11 – 2015-03-13 (×3): 20 mg via ORAL
  Filled 2015-03-11 (×3): qty 1

## 2015-03-11 MED ORDER — LEVOTHYROXINE SODIUM 100 MCG PO TABS
100.0000 ug | ORAL_TABLET | Freq: Every day | ORAL | Status: DC
  Administered 2015-03-11 – 2015-03-13 (×3): 100 ug via ORAL
  Filled 2015-03-11 (×4): qty 1

## 2015-03-11 MED ORDER — CARVEDILOL 12.5 MG PO TABS
12.5000 mg | ORAL_TABLET | Freq: Two times a day (BID) | ORAL | Status: DC
  Administered 2015-03-11 – 2015-03-13 (×4): 12.5 mg via ORAL
  Filled 2015-03-11 (×7): qty 1

## 2015-03-11 MED ORDER — GLIPIZIDE 5 MG PO TABS
5.0000 mg | ORAL_TABLET | Freq: Every day | ORAL | Status: DC
  Administered 2015-03-11 – 2015-03-13 (×2): 5 mg via ORAL
  Filled 2015-03-11 (×3): qty 1

## 2015-03-11 MED ORDER — WARFARIN SODIUM 4 MG PO TABS
4.0000 mg | ORAL_TABLET | Freq: Once | ORAL | Status: AC
  Administered 2015-03-11: 4 mg via ORAL
  Filled 2015-03-11 (×2): qty 1

## 2015-03-11 MED ORDER — ISOSORBIDE MONONITRATE ER 30 MG PO TB24
90.0000 mg | ORAL_TABLET | Freq: Two times a day (BID) | ORAL | Status: DC
  Administered 2015-03-11 – 2015-03-13 (×5): 90 mg via ORAL
  Filled 2015-03-11 (×10): qty 1

## 2015-03-11 MED ORDER — FUROSEMIDE 10 MG/ML IJ SOLN
40.0000 mg | Freq: Once | INTRAMUSCULAR | Status: AC
  Administered 2015-03-11: 40 mg via INTRAVENOUS
  Filled 2015-03-11: qty 4

## 2015-03-11 MED ORDER — WARFARIN - PHARMACIST DOSING INPATIENT: Freq: Every day | Status: DC

## 2015-03-11 MED ORDER — TORSEMIDE 20 MG PO TABS
20.0000 mg | ORAL_TABLET | Freq: Two times a day (BID) | ORAL | Status: DC
  Administered 2015-03-11 – 2015-03-12 (×4): 20 mg via ORAL
  Filled 2015-03-11 (×8): qty 1

## 2015-03-11 MED ORDER — BUDESONIDE-FORMOTEROL FUMARATE 160-4.5 MCG/ACT IN AERO
2.0000 | INHALATION_SPRAY | Freq: Two times a day (BID) | RESPIRATORY_TRACT | Status: DC
  Administered 2015-03-11 – 2015-03-13 (×5): 2 via RESPIRATORY_TRACT
  Filled 2015-03-11: qty 6

## 2015-03-11 MED ORDER — FERROUS SULFATE 325 (65 FE) MG PO TABS
325.0000 mg | ORAL_TABLET | Freq: Every day | ORAL | Status: DC
  Administered 2015-03-11 – 2015-03-13 (×3): 325 mg via ORAL
  Filled 2015-03-11 (×4): qty 1

## 2015-03-11 MED ORDER — ACETAMINOPHEN 325 MG PO TABS: 650.0000 mg | ORAL_TABLET | ORAL | Status: DC | PRN

## 2015-03-11 MED ORDER — AMLODIPINE BESYLATE 10 MG PO TABS
10.0000 mg | ORAL_TABLET | Freq: Every day | ORAL | Status: DC
  Administered 2015-03-11 – 2015-03-13 (×3): 10 mg via ORAL
  Filled 2015-03-11: qty 1
  Filled 2015-03-11: qty 2
  Filled 2015-03-11: qty 1

## 2015-03-11 MED ORDER — HYDRALAZINE HCL 50 MG PO TABS
100.0000 mg | ORAL_TABLET | Freq: Three times a day (TID) | ORAL | Status: DC
  Administered 2015-03-11 – 2015-03-13 (×7): 100 mg via ORAL
  Filled 2015-03-11 (×10): qty 2

## 2015-03-11 MED ORDER — SODIUM CHLORIDE 0.9 % IV SOLN: 250.0000 mL | INTRAVENOUS | Status: DC | PRN

## 2015-03-11 MED ORDER — VITAMIN D 1000 UNITS PO TABS
1000.0000 [IU] | ORAL_TABLET | Freq: Every day | ORAL | Status: DC
  Administered 2015-03-11 – 2015-03-13 (×3): 1000 [IU] via ORAL
  Filled 2015-03-11 (×3): qty 1

## 2015-03-11 MED ORDER — ESCITALOPRAM OXALATE 10 MG PO TABS
5.0000 mg | ORAL_TABLET | Freq: Every day | ORAL | Status: DC
  Administered 2015-03-11 – 2015-03-12 (×2): 5 mg via ORAL
  Filled 2015-03-11 (×2): qty 1

## 2015-03-11 MED ORDER — ONDANSETRON HCL 4 MG/2ML IJ SOLN: 4.0000 mg | Freq: Four times a day (QID) | INTRAMUSCULAR | Status: DC | PRN

## 2015-03-11 NOTE — Telephone Encounter (Signed)
Per chart pt went to ER 3/21 PM and was admitted

## 2015-03-11 NOTE — ED Notes (Signed)
Pt wife voicing concern about pt taking home dose of potassium, 60 mg x2 day.  MD paged, new orders received to continue home dose.  Wife updated.

## 2015-03-11 NOTE — Progress Notes (Signed)
ANTICOAGULATION CONSULT NOTE - Initial Consult  Pharmacy Consult for Coumadin Indication: atrial fibrillation  Allergies  Allergen Reactions  . Levothyroxine Sodium Other (See Comments)    MUST TAKE BRAND NAME GENERIC DOESN'T WORK FOR PATIENT    Patient Measurements: Height: 6\' 2"  (188 cm) Weight: 214 lb (97.07 kg) IBW/kg (Calculated) : 82.2  Vital Signs: Temp: 98 F (36.7 C) (03/21 2015) Temp Source: Oral (03/21 2015) BP: 170/99 mmHg (03/22 0242) Pulse Rate: 72 (03/22 0242)  Labs:  Recent Labs  03/10/15 2118  HGB 10.7*  HCT 35.4*  PLT 256  LABPROT 21.4*  INR 1.83*  CREATININE 2.46*    Estimated Creatinine Clearance: 30.2 mL/min (by C-G formula based on Cr of 2.46).   Medical History: Past Medical History  Diagnosis Date  . Gout   . HTN (hypertension)   . Hyperlipidemia   . Osteoarthritis   . History of CVA (cerebrovascular accident)     Left pontine infarct July 2004; changed from Plavix to Coumadin in 2004 per MD at [facility]  . GERD (gastroesophageal reflux disease)   . DJD (degenerative joint disease)   . BPH (benign prostatic hypertrophy)   . DM (diabetes mellitus), type 2   . Peripheral vascular disease   . Bilateral leg ulcer     ACHILLES AREA--  NONHEALING  . CKD (chronic kidney disease) stage 3, GFR 30-59 ml/min   . CAD (coronary artery disease)     a. LHC 4/12: Mid LAD 25%, mid diagonal 30%, AV circumflex 40%, proximal OM 25%, distal RCA 40%  . Chronic combined systolic and diastolic heart failure     a. Echo 05/2013: EF 25-30%, diffuse HK, restrictive physiology, trivial AI, mild MR, moderate LAE, reduced RV systolic function, PASP 42  . LBBB (left bundle branch block)   . Atrial fibrillation     a. amiodarone Rx started 05/2013;  b. chronic coumadin  . NICM (nonischemic cardiomyopathy)     a. EF 25-30%.  [name] Hx of cardiovascular stress test     Nuclear Stress Test (1/16): High risk stress nuclear study with a large, severe, partially  reversible inferior and apical defect consistent with prior inferior and apical infarct; mild apical ischemia; severe LVE; study high risk due to reduced LV function.  EF 25% >> reviewed with Dr. Antoine Poche >> medical mgmt  . CHF (congestive heart failure)     Medications:  Lipitor  Coreg  Lexapro  Glucotrol  Hydralazine  Imdur  KCl  Symbicort  Synthroid  Demadex  Norvasc  Vit D  Zaroxolyn   Coumadin 5 mg daily except 2.5 mg Tue/Fri  Assessment: 74 yo male admitted with SOB, h/o Afib, to continue Coumadin  Goal of Therapy:  INR 2-3 Monitor platelets by anticoagulation protocol: Yes   Plan:  Daily INR  Jaslyn Bansal, Gary Fleet 03/11/2015,2:57 AM

## 2015-03-11 NOTE — Telephone Encounter (Signed)
Please give him a call and see how he is doing, can potentially see this afternoon.

## 2015-03-11 NOTE — Progress Notes (Signed)
Breathing better after diuresis, but still dyspneic with any movement. Currently in NSR. Patient and family inquired about possibility of CRT implant earlier than the current scheduled date in April. Will discuss with Dr. Ladona Ridgel.

## 2015-03-11 NOTE — ED Notes (Signed)
Monique - RN and Kenney Houseman - EMT changed pt linen, gown and blankets

## 2015-03-11 NOTE — ED Notes (Signed)
Wife cell: [phone] please call with updates.

## 2015-03-11 NOTE — Progress Notes (Signed)
ANTICOAGULATION CONSULT NOTE - Follow Up Consult  Pharmacy Consult:  Coumadin Indication: atrial fibrillation  Allergies  Allergen Reactions  . Levothyroxine Sodium Other (See Comments)    MUST TAKE BRAND NAME GENERIC DOESN'T WORK FOR PATIENT  . Chicken Protein     Does not like chicken or [place]  . Eggs Or Egg-Derived Products     Does not like eggs    Patient Measurements: Height: 6\' 2"  (188 cm) Weight: 214 lb (97.07 kg) IBW/kg (Calculated) : 82.2  Vital Signs: BP: 124/70 mmHg (03/22 1200) Pulse Rate: 49 (03/22 1200)  Labs:  Recent Labs  03/10/15 2118 03/11/15 0437  HGB 10.7*  --   HCT 35.4*  --   PLT 256  --   LABPROT 21.4* 21.9*  INR 1.83* 1.89*  CREATININE 2.46*  --     Estimated Creatinine Clearance: 30.2 mL/min (by C-G formula based on Cr of 2.46).      Assessment: 19 YOM with history of Afib to continue on Coumadin.  INR currently sub-therapeutic on home regimen.  No bleeding reported   Goal of Therapy:  INR 2-3    Plan:  - Coumadin 4mg  PO today - Daily PT / INR    Tallan Sandoz D. Laney Potash, PharmD, BCPS Pager:  [phone] 03/11/2015, 12:17 PM

## 2015-03-11 NOTE — ED Notes (Signed)
Attempted report 

## 2015-03-11 NOTE — Consult Note (Signed)
CARDIOLOGY CONSULT NOTE   Patient ID: CLELL SCHWADERER MRN: [id number], DOB/AGE: [birth year]   Admit date: 03/10/2015 Date of Consult: 03/11/2015   Primary Physician: [name], MD Primary Cardiologist: Caprice Red HF: Shirlee Latch EP: Taylor  Pt. Profile  74M with HTN, DM, non-obstructive CAD, NICM (EF 30-35%, 3/16) with LBBB with planned CRT implant (4/14, Ladona Ridgel), CKD IV, CVA, PVD, AF on amiodarone, chronic venous stasis ulcers, who presents with dyspnea.  Problem List  Past Medical History  Diagnosis Date  . Gout   . HTN (hypertension)   . Hyperlipidemia   . Osteoarthritis   . History of CVA (cerebrovascular accident)     Left pontine infarct July 2004; changed from Plavix to Coumadin in 2004 per MD at [facility]  . GERD (gastroesophageal reflux disease)   . DJD (degenerative joint disease)   . BPH (benign prostatic hypertrophy)   . DM (diabetes mellitus), type 2   . Peripheral vascular disease   . Bilateral leg ulcer     ACHILLES AREA--  NONHEALING  . CKD (chronic kidney disease) stage 3, GFR 30-59 ml/min   . CAD (coronary artery disease)     a. LHC 4/12: Mid LAD 25%, mid diagonal 30%, AV circumflex 40%, proximal OM 25%, distal RCA 40%  . Chronic combined systolic and diastolic heart failure     a. Echo 05/2013: EF 25-30%, diffuse HK, restrictive physiology, trivial AI, mild MR, moderate LAE, reduced RV systolic function, PASP 42  . LBBB (left bundle branch block)   . Atrial fibrillation     a. amiodarone Rx started 05/2013;  b. chronic coumadin  . NICM (nonischemic cardiomyopathy)     a. EF 25-30%.  [name] Hx of cardiovascular stress test     Nuclear Stress Test (1/16): High risk stress nuclear study with a large, severe, partially reversible inferior and apical defect consistent with prior inferior and apical infarct; mild apical ischemia; severe LVE; study high risk due to reduced LV function.  EF 25% >> reviewed with Dr. Antoine Poche >> medical mgmt  . CHF (congestive heart failure)      Past Surgical History  Procedure Laterality Date  . Cardiac catheterization  04-16-2011   DR [facility]    NON-OBSTRUCTIVE CAD. MILDLY ELEVATED PULMONARY PRESSURES/ ELEVATED  END-DIASTOLIC PRESSURE  . Transthoracic echocardiogram  12-31-2010    MODERATE CONCENTRIC LVH/ SYSTOLIC FUNCTION SEVERELY REDUCED/ EF 25-30%/  SEVERE HYPOKINESIS OF ANTEROSEPTAL MYOCARDIUM  AND ENTIREAPICAL MYOCARDIUM /  MODERATE HYPOKINESIS OF LATERAL, INFEROLATERAL, INFERIOR,AND INFEROSEPTAL MYOCARIUM/  GRADE 3 DIASTOLIC DYSFUNCTION/ MILD MR  . Aortogram w/ bilateral lower extremitiy runoff  05-18-2013  DR FIELDS    LEFT LEG OCCLUDED PERONEAL AND ANTERIOR TIBIAL ARTERIES/ HIGH GRADE STENOIS 80% MIDDLE AND DISTAL THIRD OF POSTERIOR TIBIAL ARTERY/ RIGHT PERONEAL AND ANTERIOR TIBIAL ARTERY OCCLUDED/ 40% STENOSIS DISTALLY   . Abdominal aortagram N/A 05/18/2013    Procedure: ABDOMINAL Ronny Flurry;  Surgeon: Sherren Kerns, MD;  Location: [facility] CATH LAB;  Service: Cardiovascular;  Laterality: N/A;  . Lower extremity angiogram Bilateral 05/18/2013    Procedure: LOWER EXTREMITY ANGIOGRAM;  Surgeon: Sherren Kerns, MD;  Location: [facility] CATH LAB;  Service: Cardiovascular;  Laterality: Bilateral;  . Right heart catheterization N/A 02/13/2015    Procedure: RIGHT HEART CATH;  Surgeon: Laurey Morale, MD;  Location: [facility] CATH LAB;  Service: Cardiovascular;  Laterality: N/A;     Allergies  Allergies  Allergen Reactions  . Levothyroxine Sodium Other (See Comments)    MUST TAKE BRAND NAME GENERIC DOESN'T WORK  FOR PATIENT    HPI   74M with HTN, DM, non-obstructive CAD, NICM (EF 30-35%, 3/16) with LBBB with planned CRT implant (4/14, Ladona Ridgel), CKD IV, CVA, PVD, AF on amiodarone, chronic venous stasis ulcers, who presents with dyspnea.  Mr. Risse reports that he developed worsened dyspnea 2-3 days ago. This is worse while lying flat. He has not noticed LE edema or abdominal distention. Weight stable. Notes he missed all of his meds Sunday  night because he fell asleep but has otherwise been adherence. States he hasn't had salt in "40 years." He is on home oxygen and it has remained stable at 2.5L at night. He has felt like he needs to wear oxygen during the day which is not normal for him. He was brought to the ER for evaluation by his wife who was worried he might get worse at home. She also told ER staff that she was concerned that Mr. Ullom was feeling nervious about an upcoming trip of hers.   On arrival to the ER, he was hemodynamically stable. HR 65, 143/81 mmHg, 96 on 2L [place] % ECG was unchanged. CXR demonstrated vascular congestion and cardiomegaly, small left pleural effusion, mild recurrent pulmonary edema. Labs were notable for K 4.0, Cr 2.46. BNP 825 (Pro BNP on 01/23/15 = 432), POC TnI 0.05. He was given  PO torsemide in the ER.   Inpatient Medications  . torsemide  20 mg Oral BID    Family History Family History  Problem Relation Age of Onset  . Hypertension Mother   . Heart disease Mother   . Diabetes Mother   . Gout Other   . Stroke Other   . Hypertension Father   . Heart disease Father   . Heart attack Maternal Grandmother   . Thyroid disease Neg Hx      Social History History   Social History  . Marital Status: Married    Spouse Name: N/A  . Number of Children: N/A  . Years of Education: N/A   Occupational History  . Retired - Working in Product manager    Social History Main Topics  . Smoking status: Former Smoker    Quit date: 04/12/1974  . Smokeless tobacco: Never Used  . Alcohol Use: No     Comment: previously drank - "love cognac" quit 15 yrs ago.  . Drug Use: No  . Sexual Activity: No   Other Topics Concern  . Not on file   Social History Narrative   Worked at [employer] year.  He is married and lives with his wife [name] in [place].  Has one daughter Lowella Bandy     Review of Systems  General:  No chills, fever, night sweats or weight changes.  Cardiovascular:  rare chest  pain, No edema, orthopnea palpitations, + paroxysmal nocturnal dyspnea  Dermatological: No rash, lesions/masses Respiratory: No cough, + dyspnea  Urologic: No hematuria, dysuria Abdominal:   No nausea, vomiting, diarrhea, bright red blood per rectum, melena, or hematemesis Neurologic:  No visual changes, wkns, changes in mental status. All other systems reviewed and are otherwise negative except as noted above.  Physical Exam  Blood pressure 163/91, pulse 59, temperature 98 F (36.7 C), temperature source Oral, resp. rate 25, height  (1.88 m), weight 97.07 kg (214 lb), SpO2 94 %.  General: Pleasant, NAD, elderly Psych: Normal affect. Neuro: Alert and oriented X 3. Moves all extremities spontaneously. HEENT: Normal  Neck: Supple without bruits or JVD. Lungs:  Resp regular  and unlabored, fine basilar crackles Heart: RRR no s3, s4, or murmurs. Abdomen: Soft, non-tender, non-distended, BS + x 4.  Extremities: No clubbing, cyanosis. Trace LE edema, DP/PT/Radials 2+ and equal bilaterally.  Labs  No results for input(s): CKTOTAL, CKMB, TROPONINI in the last 72 hours. Lab Results  Component Value Date   WBC 9.5 03/10/2015   HGB 10.7* 03/10/2015   HCT 35.4* 03/10/2015   MCV 87.4 03/10/2015   PLT 256 03/10/2015    Recent Labs Lab 03/10/15 2118  NA 147*  K 4.0  CL 110  CO2 29  BUN 22  CREATININE 2.46*  CALCIUM 9.1  GLUCOSE 89   Lab Results  Component Value Date   CHOL 269* 06/24/2014   HDL 56.50 06/24/2014   LDLCALC 186* 06/24/2014   TRIG 133.0 06/24/2014   No results found for: DDIMER  Radiology/Studies  Dg Chest 2 View  03/10/2015   CLINICAL DATA:  Acute onset of shortness of breath. Initial encounter.  EXAM: CHEST  2 VIEW  COMPARISON:  Chest radiograph performed 11/27/2013  FINDINGS: The lungs are well-aerated. Vascular congestion is noted, with bilateral central airspace opacities and a small left pleural effusion, likely reflecting mild recurrent pulmonary  edema. No pneumothorax is seen.  The heart is enlarged.  No acute osseous abnormalities are seen.  IMPRESSION: Vascular congestion and cardiomegaly, with bilateral central airspace opacities and a small left pleural effusion, likely reflecting mild recurrent pulmonary edema.   Electronically Signed   By: Roanna Raider M.D.   On: 03/10/2015 22:54   TTE 02/26/15 Study Conclusions  - Left ventricle: Wall thickness was increased in a pattern of mild LVH. Systolic function was moderately to severely reduced. The estimated ejection fraction was in the range of 30% to 35%. Doppler parameters are consistent with a reversible restrictive pattern, indicative of decreased left ventricular diastolic compliance and/or increased left atrial pressure (grade 3 diastolic dysfunction). - Aortic valve: There was trivial regurgitation. - Mitral valve: There was moderate regurgitation. - Left atrium: The atrium was moderately dilated. - Right ventricle: The cavity size was moderately dilated. Systolic function was moderately reduced. - Right atrium: The atrium was severely dilated. - Pulmonary arteries: Systolic pressure was moderately increased. PA peak pressure: 54 mm Hg (S).  ECG  03/10/15 @ 20:18L NSR, LBBB, 1st degree AVB, LAD. Unchanged compared to 02/13/14   ASSESSMENT AND PLAN  1M with HTN, DM, non-obstructive CAD, NICM (EF 30-35%, 3/16) with LBBB with planned CRT implant (4/14, Ladona Ridgel), CKD IV, CVA, PVD, AF on amiodarone, chronic venous stasis ulcers, who presents with dyspnea.  Mr. Schramm appears to be mildly volume overloaded by history and imaging although he looks well compensated on exam. He could probably be discharged for an outpatient diuresis but it is not unreasonable (particularly given severity of renal dysfunction) to diurese in the setting of a brief observation admission.   Acute on chronic systolic HF 1.  IV Lasix x 1, resume home torsemide in AM 2. Continue  Isordil/hydral 3. Coreg: increase from 6.25mg  to 12.5mg  BID due to HTN and brief episode of pAF in the ED with rates around 130bpm  HTN 1. Continue home norvasc, Isordil, Hydral 2. Increase coreg as per above  pAF, CHADSVASC = 8 (every point except for male sex) INR subtherapeutic. Noted to have run of AF up to ~130 while at rest on monitor with spontaneous conversion to NSR 1. Pharmacy consult for warfarin 2. Increase coreg dose as per above  CKD, stable  Signed,  Glori Luis, MD 03/11/2015, 2:08 AM

## 2015-03-11 NOTE — Progress Notes (Signed)
PT Cancellation Note  Patient Details Name: Frank Mclean MRN: [id number] DOB: [birth year]   Cancelled Treatment:    Reason Eval/Treat Not Completed: Patient at procedure or test/unavailable   Arrived in ED to evaluate pt and informed pt currently being transported to [facility] for admission.   Maryann Mccall 03/11/2015, 12:44 PM  Pager [phone]

## 2015-03-12 ENCOUNTER — Encounter (HOSPITAL_COMMUNITY)
Admission: EM | Disposition: A | Discharge status: Home Health | Payer: Self-pay | Source: Home / Self Care | Attending: Cardiology

## 2015-03-12 ENCOUNTER — Encounter (HOSPITAL_COMMUNITY): Payer: Self-pay | Admitting: Nurse Practitioner

## 2015-03-12 DIAGNOSIS — Z7901 Long term (current) use of anticoagulants: Secondary | ICD-10-CM | POA: Diagnosis not present

## 2015-03-12 DIAGNOSIS — J189 Pneumonia, unspecified organism: Secondary | ICD-10-CM | POA: Diagnosis not present

## 2015-03-12 DIAGNOSIS — Z823 Family history of stroke: Secondary | ICD-10-CM | POA: Diagnosis not present

## 2015-03-12 DIAGNOSIS — R413 Other amnesia: Secondary | ICD-10-CM | POA: Diagnosis present

## 2015-03-12 DIAGNOSIS — E039 Hypothyroidism, unspecified: Secondary | ICD-10-CM | POA: Diagnosis present

## 2015-03-12 DIAGNOSIS — I2510 Atherosclerotic heart disease of native coronary artery without angina pectoris: Secondary | ICD-10-CM | POA: Diagnosis present

## 2015-03-12 DIAGNOSIS — R4182 Altered mental status, unspecified: Secondary | ICD-10-CM | POA: Diagnosis not present

## 2015-03-12 DIAGNOSIS — Z7951 Long term (current) use of inhaled steroids: Secondary | ICD-10-CM | POA: Diagnosis not present

## 2015-03-12 DIAGNOSIS — R404 Transient alteration of awareness: Secondary | ICD-10-CM | POA: Diagnosis not present

## 2015-03-12 DIAGNOSIS — I428 Other cardiomyopathies: Secondary | ICD-10-CM | POA: Diagnosis present

## 2015-03-12 DIAGNOSIS — E870 Hyperosmolality and hypernatremia: Secondary | ICD-10-CM | POA: Diagnosis not present

## 2015-03-12 DIAGNOSIS — J9809 Other diseases of bronchus, not elsewhere classified: Secondary | ICD-10-CM | POA: Diagnosis not present

## 2015-03-12 DIAGNOSIS — I739 Peripheral vascular disease, unspecified: Secondary | ICD-10-CM | POA: Diagnosis present

## 2015-03-12 DIAGNOSIS — I517 Cardiomegaly: Secondary | ICD-10-CM | POA: Diagnosis not present

## 2015-03-12 DIAGNOSIS — Z833 Family history of diabetes mellitus: Secondary | ICD-10-CM | POA: Diagnosis not present

## 2015-03-12 DIAGNOSIS — Z9981 Dependence on supplemental oxygen: Secondary | ICD-10-CM | POA: Diagnosis not present

## 2015-03-12 DIAGNOSIS — I509 Heart failure, unspecified: Secondary | ICD-10-CM | POA: Diagnosis not present

## 2015-03-12 DIAGNOSIS — I480 Paroxysmal atrial fibrillation: Secondary | ICD-10-CM | POA: Diagnosis present

## 2015-03-12 DIAGNOSIS — I5043 Acute on chronic combined systolic (congestive) and diastolic (congestive) heart failure: Secondary | ICD-10-CM | POA: Diagnosis present

## 2015-03-12 DIAGNOSIS — E440 Moderate protein-calorie malnutrition: Secondary | ICD-10-CM | POA: Diagnosis not present

## 2015-03-12 DIAGNOSIS — K219 Gastro-esophageal reflux disease without esophagitis: Secondary | ICD-10-CM | POA: Diagnosis present

## 2015-03-12 DIAGNOSIS — I5022 Chronic systolic (congestive) heart failure: Secondary | ICD-10-CM | POA: Diagnosis not present

## 2015-03-12 DIAGNOSIS — Z8673 Personal history of transient ischemic attack (TIA), and cerebral infarction without residual deficits: Secondary | ICD-10-CM | POA: Diagnosis not present

## 2015-03-12 DIAGNOSIS — Z950 Presence of cardiac pacemaker: Secondary | ICD-10-CM | POA: Diagnosis not present

## 2015-03-12 DIAGNOSIS — I10 Essential (primary) hypertension: Secondary | ICD-10-CM | POA: Diagnosis present

## 2015-03-12 DIAGNOSIS — E1122 Type 2 diabetes mellitus with diabetic chronic kidney disease: Secondary | ICD-10-CM | POA: Diagnosis present

## 2015-03-12 DIAGNOSIS — I255 Ischemic cardiomyopathy: Secondary | ICD-10-CM | POA: Diagnosis not present

## 2015-03-12 DIAGNOSIS — R531 Weakness: Secondary | ICD-10-CM | POA: Diagnosis not present

## 2015-03-12 DIAGNOSIS — Z87891 Personal history of nicotine dependence: Secondary | ICD-10-CM | POA: Diagnosis not present

## 2015-03-12 DIAGNOSIS — R0602 Shortness of breath: Secondary | ICD-10-CM | POA: Diagnosis not present

## 2015-03-12 DIAGNOSIS — E785 Hyperlipidemia, unspecified: Secondary | ICD-10-CM | POA: Diagnosis present

## 2015-03-12 DIAGNOSIS — Z8249 Family history of ischemic heart disease and other diseases of the circulatory system: Secondary | ICD-10-CM | POA: Diagnosis not present

## 2015-03-12 DIAGNOSIS — N184 Chronic kidney disease, stage 4 (severe): Secondary | ICD-10-CM | POA: Diagnosis not present

## 2015-03-12 DIAGNOSIS — A419 Sepsis, unspecified organism: Secondary | ICD-10-CM | POA: Diagnosis not present

## 2015-03-12 DIAGNOSIS — I447 Left bundle-branch block, unspecified: Secondary | ICD-10-CM | POA: Diagnosis present

## 2015-03-12 DIAGNOSIS — I129 Hypertensive chronic kidney disease with stage 1 through stage 4 chronic kidney disease, or unspecified chronic kidney disease: Secondary | ICD-10-CM | POA: Diagnosis present

## 2015-03-12 DIAGNOSIS — Z79899 Other long term (current) drug therapy: Secondary | ICD-10-CM | POA: Diagnosis not present

## 2015-03-12 HISTORY — PX: BI-VENTRICULAR IMPLANTABLE CARDIOVERTER DEFIBRILLATOR: SHX5459

## 2015-03-12 LAB — GLUCOSE, CAPILLARY
GLUCOSE-CAPILLARY: 98 mg/dL (ref 70–99)
Glucose-Capillary: 88 mg/dL (ref 70–99)
Glucose-Capillary: 150 mg/dL — ABNORMAL HIGH (ref 70–99)

## 2015-03-12 LAB — BASIC METABOLIC PANEL
ANION GAP: 10 (ref 5–15)
BUN: 18 mg/dL (ref 6–23)
CALCIUM: 8.9 mg/dL (ref 8.4–10.5)
CHLORIDE: 109 mmol/L (ref 96–112)
CO2: 27 mmol/L (ref 19–32)
CREATININE: 2.39 mg/dL — AB (ref 0.50–1.35)
GFR calc Af Amer: 29 mL/min — ABNORMAL LOW (ref >90)
GFR calc non Af Amer: 25 mL/min — ABNORMAL LOW (ref >90)
GLUCOSE: 89 mg/dL (ref 70–99)
Potassium: 4.3 mmol/L (ref 3.5–5.1)
Sodium: 146 mmol/L — ABNORMAL HIGH (ref 135–145)

## 2015-03-12 LAB — PROTIME-INR
INR: 2.21 — AB (ref 0.00–1.49)
Prothrombin Time: 24.7 seconds — ABNORMAL HIGH (ref 11.6–15.2)

## 2015-03-12 SURGERY — BI-VENTRICULAR IMPLANTABLE CARDIOVERTER DEFIBRILLATOR  (CRT-D)

## 2015-03-12 MED ORDER — SODIUM CHLORIDE 0.9 % IR SOLN
80.0000 mg | Status: DC
  Filled 2015-03-12: qty 2

## 2015-03-12 MED ORDER — MIDAZOLAM HCL 2 MG/2ML IJ SOLN
INTRAMUSCULAR | Status: AC
  Filled 2015-03-12: qty 2

## 2015-03-12 MED ORDER — ACETAMINOPHEN 325 MG PO TABS
325.0000 mg | ORAL_TABLET | ORAL | Status: DC | PRN
Start: 2015-03-12 — End: 2015-03-13
  Administered 2015-03-13: 650 mg via ORAL
  Filled 2015-03-12: qty 2

## 2015-03-12 MED ORDER — TORSEMIDE 20 MG PO TABS
20.0000 mg | ORAL_TABLET | Freq: Every evening | ORAL | Status: DC
  Administered 2015-03-12: 20 mg via ORAL
  Filled 2015-03-12: qty 1

## 2015-03-12 MED ORDER — SODIUM CHLORIDE 0.9 % IV SOLN
INTRAVENOUS | Status: DC
  Administered 2015-03-12: 11:00:00 via INTRAVENOUS

## 2015-03-12 MED ORDER — LIDOCAINE HCL (PF) 1 % IJ SOLN
INTRAMUSCULAR | Status: AC
  Filled 2015-03-12: qty 60

## 2015-03-12 MED ORDER — ONDANSETRON HCL 4 MG/2ML IJ SOLN: 4.0000 mg | Freq: Four times a day (QID) | INTRAMUSCULAR | Status: DC | PRN

## 2015-03-12 MED ORDER — CEFAZOLIN SODIUM-DEXTROSE 2-3 GM-% IV SOLR
2.0000 g | INTRAVENOUS | Status: DC
  Filled 2015-03-12: qty 50

## 2015-03-12 MED ORDER — HEPARIN (PORCINE) IN NACL 2-0.9 UNIT/ML-% IJ SOLN
INTRAMUSCULAR | Status: AC
  Filled 2015-03-12: qty 500

## 2015-03-12 MED ORDER — TORSEMIDE 20 MG PO TABS
40.0000 mg | ORAL_TABLET | Freq: Every day | ORAL | Status: DC
  Administered 2015-03-13: 40 mg via ORAL
  Filled 2015-03-12: qty 2

## 2015-03-12 MED ORDER — CHLORHEXIDINE GLUCONATE 4 % EX LIQD
60.0000 mL | Freq: Once | CUTANEOUS | Status: DC
  Filled 2015-03-12: qty 15

## 2015-03-12 MED ORDER — CHLORHEXIDINE GLUCONATE 4 % EX LIQD
60.0000 mL | Freq: Once | CUTANEOUS | Status: AC
  Administered 2015-03-12: 4 via TOPICAL

## 2015-03-12 MED ORDER — WARFARIN SODIUM 2.5 MG PO TABS
2.5000 mg | ORAL_TABLET | Freq: Once | ORAL | Status: AC
  Administered 2015-03-12: 2.5 mg via ORAL
  Filled 2015-03-12: qty 1

## 2015-03-12 MED ORDER — FENTANYL CITRATE 0.05 MG/ML IJ SOLN
INTRAMUSCULAR | Status: AC
  Filled 2015-03-12: qty 2

## 2015-03-12 MED ORDER — CEFAZOLIN SODIUM 1-5 GM-% IV SOLN
1.0000 g | Freq: Four times a day (QID) | INTRAVENOUS | Status: AC
  Administered 2015-03-12 – 2015-03-13 (×3): 1 g via INTRAVENOUS
  Filled 2015-03-12 (×3): qty 50

## 2015-03-12 NOTE — Consult Note (Signed)
ELECTROPHYSIOLOGY CONSULT NOTE    Patient ID: Frank Mclean MRN: [id number], DOB/AGE: 1941/09/23 74 y.o.  Admit date: 03/10/2015 Date of Consult: 03/12/2015  Primary Physician: [name], MD Primary Cardiologist: Antoine Poche AHF: Shirlee Latch Electrophysiologist: Ladona Ridgel  Reason for Consultation: cardiomyopathy, congestive heart failure  HPI:  Frank Mclean is a 74 y.o. male with a past medical history significant for long standing non-ischemic cardiomyopathy, chronic class 3 systolic heart failure, LBBB, and atrial fibrillation (on chronic Coumadin).  He was recently seen in the office and CRTD implant was recommended. This was scheduled for mid-April.  The patient was admitted on 03-10-15 with acute on chronic systolic heart failure and has improved with diuresis.  EP has been asked to see if CRTD implant can be arranged this admission.  He has not had recent fevers or chills.  He states that his shortness of breath has improved since admission.  He has diuresed 2L.  He has not had chest pain, orthopnea, PND.  ROS is otherwise negative.    Echocardiogram demonstrated EF 30-35%, grade 3 diastolic dysfunction, moderate MR, LA severely dilated, PA pressure 54.  Lab work is reviewed and notable for INR of 2.21, creat 2.39, Hgb 10.7.   Past Medical History  Diagnosis Date  . Gout   . HTN (hypertension)   . Hyperlipidemia   . Osteoarthritis   . History of CVA (cerebrovascular accident)     Left pontine infarct July 2004; changed from Plavix to Coumadin in 2004 per MD at [facility]  . GERD (gastroesophageal reflux disease)   . DJD (degenerative joint disease)   . BPH (benign prostatic hypertrophy)   . Peripheral vascular disease   . Bilateral leg ulcer     ACHILLES AREA--  NONHEALING  . CKD (chronic kidney disease) stage 3, GFR 30-59 ml/min   . CAD (coronary artery disease)     a. LHC 4/12: Mid LAD 25%, mid diagonal 30%, AV circumflex 40%, proximal OM 25%, distal RCA 40%  . Chronic combined  systolic and diastolic heart failure     a. Echo 05/2013: EF 25-30%, diffuse HK, restrictive physiology, trivial AI, mild MR, moderate LAE, reduced RV systolic function, PASP 42  . LBBB (left bundle branch block)   . Atrial fibrillation     a. amiodarone Rx started 05/2013;  b. chronic coumadin  . NICM (nonischemic cardiomyopathy)     a. EF 25-30%.  [name] Hx of cardiovascular stress test     Nuclear Stress Test (1/16): High risk stress nuclear study with a large, severe, partially reversible inferior and apical defect consistent with prior inferior and apical infarct; mild apical ischemia; severe LVE; study high risk due to reduced LV function.  EF 25% >> reviewed with Dr. Antoine Poche >> medical mgmt  . Hypothyroidism   . DM (diabetes mellitus), type 2      Surgical History:  Past Surgical History  Procedure Laterality Date  . Cardiac catheterization  04-16-2011   [facility]    NON-OBSTRUCTIVE CAD. MILDLY ELEVATED PULMONARY PRESSURES/ ELEVATED  END-DIASTOLIC PRESSURE  . Transthoracic echocardiogram  12-31-2010    MODERATE CONCENTRIC LVH/ SYSTOLIC FUNCTION SEVERELY REDUCED/ EF 25-30%/  SEVERE HYPOKINESIS OF ANTEROSEPTAL MYOCARDIUM  AND ENTIREAPICAL MYOCARDIUM /  MODERATE HYPOKINESIS OF LATERAL, INFEROLATERAL, INFERIOR,AND INFEROSEPTAL MYOCARIUM/  GRADE 3 DIASTOLIC DYSFUNCTION/ MILD MR  . Aortogram w/ bilateral lower extremitiy runoff  05-18-2013  DR [facility]    LEFT LEG OCCLUDED PERONEAL AND ANTERIOR TIBIAL ARTERIES/ HIGH GRADE STENOIS 80% MIDDLE AND DISTAL THIRD OF  POSTERIOR TIBIAL ARTERY/ RIGHT PERONEAL AND ANTERIOR TIBIAL ARTERY OCCLUDED/ 40% STENOSIS DISTALLY   . Abdominal aortagram N/A 05/18/2013    Procedure: ABDOMINAL Ronny Flurry;  Surgeon: Sherren Kerns, MD;  Location: [facility] CATH LAB;  Service: Cardiovascular;  Laterality: N/A;  . Lower extremity angiogram Bilateral 05/18/2013    Procedure: LOWER EXTREMITY ANGIOGRAM;  Surgeon: Sherren Kerns, MD;  Location: [facility] CATH LAB;  Service: Cardiovascular;   Laterality: Bilateral;  . Right heart catheterization N/A 02/13/2015    Procedure: RIGHT HEART CATH;  Surgeon: Laurey Morale, MD;  Location: [facility] CATH LAB;  Service: Cardiovascular;  Laterality: N/A;     Prescriptions prior to admission  Medication Sig Dispense Refill Last Dose  . amLODipine (NORVASC) 10 MG tablet Take 10 mg by mouth daily.   03/10/2015 at Unknown time  . atorvastatin (LIPITOR) 20 MG tablet Take 1 tablet (20 mg total) by mouth daily. 90 tablet 3 03/10/2015 at Unknown time  . carvedilol (COREG) 6.25 MG tablet TAKE 1 TABLET (6.25 MG TOTAL) BY MOUTH 2 (TWO) TIMES DAILY WITH A MEAL. 60 tablet 5 03/10/2015 at 1000  . cholecalciferol (VITAMIN D) 1000 UNITS tablet Take 1,000 Units by mouth daily.   03/10/2015 at Unknown time  . CVS VITAMIN B12 1000 MCG tablet TAKE 1 TABLET BY MOUTH DAILY 100 tablet 1 03/10/2015 at Unknown time  . escitalopram (LEXAPRO) 5 MG tablet Take 1 tablet (5 mg total) by mouth daily. (Patient taking differently: Take 5 mg by mouth at bedtime. ) 30 tablet 5 03/09/2015 at Unknown time  . ferrous sulfate 325 (65 FE) MG tablet TAKE 1 TABLET BY MOUTH EVERY DAY 30 tablet 6 03/10/2015 at Unknown time  . glipiZIDE (GLUCOTROL) 5 MG tablet TAKE 1/2 TABLET BY MOUTH TWICE A DAY BEFORE A MEAL 30 tablet 11 03/10/2015 at Unknown time  . hydrALAZINE (APRESOLINE) 100 MG tablet Take 1 tablet (100 mg total) by mouth 3 (three) times daily. 90 tablet 11 03/10/2015 at Unknown time  . isosorbide mononitrate (IMDUR) 60 MG 24 hr tablet Take 1.5 tablets for 90 mg by mouth twice a day. (Patient taking differently: Take 90 mg by mouth 2 (two) times daily. Take 1.5 tablets for 90 mg by mouth twice a day.) 45 tablet 11 03/10/2015 at Unknown time  . metolazone (ZAROXOLYN) 5 MG tablet Take 5 mg by mouth as needed (fluid).    PRN  . OXYGEN Inhale 2.5 L into the lungs See admin instructions. USES OXYGEN EVERY BEDTIME USES DURING DAY ONLY AS NEEDED   03/10/2015 at Unknown time  . potassium chloride SA  (KLOR-CON M20) 20 MEQ tablet Take 3 tabs TWICE DAILY (Patient taking differently: 60 mEq 2 (two) times daily. Take 3 tabs TWICE DAILY) 180 tablet 6 03/10/2015 at Unknown time  . SYMBICORT 160-4.5 MCG/ACT inhaler INHALE 2 PUFFS INTO THE LUNGS 2 TIMES DAILY 10.2 g 5 03/10/2015 at Unknown time  . SYNTHROID 100 MCG tablet TAKE 2 TABLETS BY MOUTH EVERY MORNING BEFORE BREAKFAST 60 tablet 11 03/10/2015 at Unknown time  . torsemide (DEMADEX) 20 MG tablet Take 1 tablet (20 mg total) by mouth 2 (two) times daily. 60 tablet 11 03/10/2015 at Unknown time  . warfarin (COUMADIN) 5 MG tablet TAKE 1 TABLET (5 MG TOTAL) BY MOUTH AS DIRECTED. 40 tablet 2 03/10/2015 at Unknown time    Inpatient Medications:  . amLODipine  10 mg Oral Daily  . atorvastatin  20 mg Oral Daily  . budesonide-formoterol  2 puff Inhalation BID  .  carvedilol  12.5 mg Oral BID WC  .  ceFAZolin (ANCEF) IV  2 g Intravenous On Call  . chlorhexidine  60 mL Topical Once  . chlorhexidine  60 mL Topical Once  . cholecalciferol  1,000 Units Oral Daily  . escitalopram  5 mg Oral QHS  . ferrous sulfate  325 mg Oral Q breakfast  . gentamicin irrigation  80 mg Irrigation On Call  . glipiZIDE  5 mg Oral QAC breakfast  . hydrALAZINE  100 mg Oral 3 times per day  . isosorbide mononitrate  90 mg Oral BID WC  . levothyroxine  100 mcg Oral QAC breakfast  . potassium chloride SA  60 mEq Oral BID  . sodium chloride  3 mL Intravenous Q12H  . torsemide  20 mg Oral BID  . Warfarin - Pharmacist Dosing Inpatient   Does not apply q1800    Allergies:  Allergies  Allergen Reactions  . Levothyroxine Sodium Other (See Comments)    MUST TAKE BRAND NAME GENERIC DOESN'T WORK FOR PATIENT  . Chicken Protein     Does not like chicken or [place]  . Eggs Or Egg-Derived Products     Does not like eggs    History   Social History  . Marital Status: Married    Spouse Name: N/A  . Number of Children: N/A  . Years of Education: N/A   Occupational History  .  Retired - Working in Product manager    Social History Main Topics  . Smoking status: Former Smoker    Quit date: 04/12/1974  . Smokeless tobacco: Never Used  . Alcohol Use: No     Comment: previously drank - "love cognac" quit 15 yrs ago.  . Drug Use: No  . Sexual Activity: No   Other Topics Concern  . Not on file   Social History Narrative   Worked at [employer] year.  He is married and lives with his wife [name] in [place].  Has one daughter Lowella Bandy     Family History  Problem Relation Age of Onset  . Hypertension Mother   . Heart disease Mother   . Diabetes Mother   . Gout Other   . Stroke Other   . Hypertension Father   . Heart disease Father   . Heart attack Maternal Grandmother   . Thyroid disease Neg Hx      Physical Exam: Filed Vitals:   03/12/15 0510 03/12/15 0511 03/12/15 0835 03/12/15 0839  BP: 137/83 137/83  146/88  Pulse: 53 54  60  Temp:  98.2 F (36.8 C) 97.4 F (36.3 C)   TempSrc:  Oral Oral   Resp: Height:      Weight:  209 lb 14.4 oz (95.21 kg)    SpO2: 100% 100%  99%    GEN- The patient is well appearing, alert and oriented x 3 today.   HEENT: normocephalic, atraumatic; sclera clear, conjunctiva pink; hearing intact; oropharynx clear; neck supple, no JVP Lungs- Clear to ausculation bilaterally, normal work of breathing.  No wheezes, rales, rhonchi Heart- Regular rate and rhythm, no murmurs, rubs or gallops GI- soft, non-tender, non-distended, bowel sounds present Extremities- no clubbing, cyanosis, or edema; DP/PT/radial pulses 1+ bilaterally MS- no significant deformity or atrophy Skin- warm and dry, no rash or lesion Psych- euthymic mood, full affect Neuro- strength and sensation are intact  Labs:   Lab Results  Component Value Date   WBC 9.5 03/10/2015   HGB  10.7* 03/10/2015   HCT 35.4* 03/10/2015   MCV 87.4 03/10/2015   PLT 256 03/10/2015     Recent Labs Lab 03/12/15 0329  NA 146*  K 4.3  CL 109  CO2 27    BUN 18  CREATININE 2.39*  CALCIUM 8.9  GLUCOSE 89      Radiology/Studies: Dg Chest 2 View 03/10/2015   CLINICAL DATA:  Acute onset of shortness of breath. Initial encounter.  EXAM: CHEST  2 VIEW  COMPARISON:  Chest radiograph performed 11/27/2013  FINDINGS: The lungs are well-aerated. Vascular congestion is noted, with bilateral central airspace opacities and a small left pleural effusion, likely reflecting mild recurrent pulmonary edema. No pneumothorax is seen.  The heart is enlarged.  No acute osseous abnormalities are seen.  IMPRESSION: Vascular congestion and cardiomegaly, with bilateral central airspace opacities and a small left pleural effusion, likely reflecting mild recurrent pulmonary edema.   Electronically Signed   By: Roanna Raider M.D.   On: 03/10/2015 22:54    EKG:SR, LBBB QRS 187, 1st degree AV block PR 192  TELEMETRY: sinus rhythm with PVC's  Assessment/Plan: 1.  Acute on chronic systolic heart failure/NICM/LBBB Improved with diuresis Will plan CRTD implant today if schedule allows  2.  Paroxysmal atrial fibrillation Continue Warfarin for CHADS2VASC of 9  3.  Hypertension Stable No change required today   Signed, Gypsy Balsam, NP 03/12/2015 9:09 AM  EP Attending  Patient seen and examined. Patient well known to me from prior clinic visit who admitted with acute on chronic systolic heart failure. He has improved with diuresis. I have discussed the options for treatment with the patient and his family and he wishes to proceed with BiV ICD implant today. The risks/benefits/goals/expectations of ICD implant have been discussed with the patient and he wishes to proceed.  Lewayne Bunting, M.D.

## 2015-03-12 NOTE — Progress Notes (Signed)
PT Cancellation Note  Patient Details Name: CLAYSON DEUBEL MRN: [id number] DOB: 29-Apr-1941   Cancelled Treatment:    Reason Eval/Treat Not Completed: Medical issues which prohibited therapy (Per pt and nurse, pt going for defibrillator shortly.)Will return in am.  Thanks.   Tawni Millers F 03/12/2015, 10:29 AM [facility] [phone] (pager)

## 2015-03-12 NOTE — Progress Notes (Signed)
Patient ID: Frank Mclean, male   DOB: 1941/03/28, 74 y.o.   MRN: [id number]   SUBJECTIVE: No dyspnea at rest.  Going for CRT-D today.    Scheduled Meds: . amLODipine  10 mg Oral Daily  . atorvastatin  20 mg Oral Daily  . budesonide-formoterol  2 puff Inhalation BID  . carvedilol  12.5 mg Oral BID WC  .  ceFAZolin (ANCEF) IV  2 g Intravenous On Call  . chlorhexidine  60 mL Topical Once  . chlorhexidine  60 mL Topical Once  . cholecalciferol  1,000 Units Oral Daily  . escitalopram  5 mg Oral QHS  . ferrous sulfate  325 mg Oral Q breakfast  . gentamicin irrigation  80 mg Irrigation On Call  . glipiZIDE  5 mg Oral QAC breakfast  . hydrALAZINE  100 mg Oral 3 times per day  . isosorbide mononitrate  90 mg Oral BID WC  . levothyroxine  100 mcg Oral QAC breakfast  . potassium chloride SA  60 mEq Oral BID  . sodium chloride  3 mL Intravenous Q12H  . torsemide  20 mg Oral QPM  . [START ON 03/13/2015] torsemide  40 mg Oral Q breakfast  . Warfarin - Pharmacist Dosing Inpatient   Does not apply q1800   Continuous Infusions: . sodium chloride     PRN Meds:.sodium chloride, acetaminophen, ondansetron (ZOFRAN) IV, sodium chloride    Filed Vitals:   03/12/15 0510 03/12/15 0511 03/12/15 0835 03/12/15 0839  BP: 137/83 137/83  146/88  Pulse: 53 54  60  Temp:  98.2 F (36.8 C) 97.4 F (36.3 C)   TempSrc:  Oral Oral   Resp: 18 18  14   Height:      Weight:  209 lb 14.4 oz (95.21 kg)    SpO2: 100% 100%  99%    Intake/Output Summary (Last 24 hours) at 03/12/15 0936 Last data filed at 03/12/15 0500  Gross per 24 hour  Intake    200 ml  Output    950 ml  Net   -750 ml    LABS: Basic Metabolic Panel:  Recent Labs  09/17/56 2118 03/12/15 0329  NA 147* 146*  K 4.0 4.3  CL 110 109  CO2 29 27  GLUCOSE 89 89  BUN 22 18  CREATININE 2.46* 2.39*  CALCIUM 9.1 8.9   Liver Function Tests: No results for input(s): AST, ALT, ALKPHOS, BILITOT, PROT, ALBUMIN in the last 72 hours. No  results for input(s): LIPASE, AMYLASE in the last 72 hours. CBC:  Recent Labs  03/10/15 2118  WBC 9.5  NEUTROABS 8.2*  HGB 10.7*  HCT 35.4*  MCV 87.4  PLT 256   Cardiac Enzymes: No results for input(s): CKTOTAL, CKMB, CKMBINDEX, TROPONINI in the last 72 hours. BNP: Invalid input(s): POCBNP D-Dimer: No results for input(s): DDIMER in the last 72 hours. Hemoglobin A1C: No results for input(s): HGBA1C in the last 72 hours. Fasting Lipid Panel: No results for input(s): CHOL, HDL, LDLCALC, TRIG, CHOLHDL, LDLDIRECT in the last 72 hours. Thyroid Function Tests: No results for input(s): TSH, T4TOTAL, T3FREE, THYROIDAB in the last 72 hours.  Invalid input(s): FREET3 Anemia Panel: No results for input(s): VITAMINB12, FOLATE, FERRITIN, TIBC, IRON, RETICCTPCT in the last 72 hours.  RADIOLOGY: Dg Chest 2 View  03/10/2015   CLINICAL DATA:  Acute onset of shortness of breath. Initial encounter.  EXAM: CHEST  2 VIEW  COMPARISON:  Chest radiograph performed 11/27/2013  FINDINGS: The lungs are well-aerated. Vascular  congestion is noted, with bilateral central airspace opacities and a small left pleural effusion, likely reflecting mild recurrent pulmonary edema. No pneumothorax is seen.  The heart is enlarged.  No acute osseous abnormalities are seen.  IMPRESSION: Vascular congestion and cardiomegaly, with bilateral central airspace opacities and a small left pleural effusion, likely reflecting mild recurrent pulmonary edema.   Electronically Signed   By: Roanna Raider M.D.   On: 03/10/2015 22:54    PHYSICAL EXAM General: NAD Neck: JVP 8-9 cm, no thyromegaly or thyroid nodule.  Lungs: Clear to auscultation bilaterally with normal respiratory effort. CV: Nondisplaced PMI.  Heart regular S1/S2, no S3/S4, no murmur.  No peripheral edema.  No carotid bruit.  Normal pedal pulses.  Abdomen: Soft, nontender, no hepatosplenomegaly, no distention.  Neurologic: Alert and oriented x 3.  Psych: Normal  affect. Extremities: No clubbing or cyanosis.   TELEMETRY: Reviewed telemetry pt in NSR  ASSESSMENT AND PLAN: 1. Chronic systolic CHF: Nonischemic cardiomyopathy.  EF 30-35% with moderate MR and moderately decreased systolic function on 3/16. He has a chronic LBBB, very wide QRS.  He was admitted with increased dyspnea, probably mildly volume overloaded and now doing better.  - Will increase home torsemide to 40 qam/20 qpm.   - He will get CRT-D implant during this admission, hopefully today.  - Continue current Coreg and hydralazine/Imdur.  - No ACEI or spironolactone with CKD.  2. CAD: Patient had cardiac cath in 4/12 with mild nonobstructive CAD. He has had no chest pain. Given increased exertional dyspnea, he had Cardiolite in 1/16. This showed a primarily fixed large inferior and apical perfusion defect. This likely represents prior infarction with minimal peri-infarct ischemia (versus artifact). We talked at length about coronary angiography today. Given lack of chest pain and minimal ischemia, I have elected to hold off on coronary angiography in the setting of CKD stage III-IV.  - He is on warfarin so not on ASA.  - Continue statin.  3. Atrial fibrillation: Paroxysmal. He is in NSR today. Continue warfarin, Coreg. 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 This is now under control.  4. CKD: Stage III-IV. Follows with Dr Lowell Guitar. Stable here.   Dannel Rafter 03/12/2015 9:48 AM

## 2015-03-12 NOTE — CV Procedure (Signed)
SURGEON:  Lewayne Bunting, MD      PREPROCEDURE DIAGNOSES:   1. Nonischemic cardiomyopathy.   2. New York Heart Association class III, heart failure chronically.   3. Left bundle-branch block.      POSTPROCEDURE DIAGNOSES:   1. Nonischemic cardiomyopathy.   2. New York Heart Association class III heart failure chronically.   3. Left bundle-branch block.      PROCEDURES:    1. Biventricular ICD implantation with the generator inserted, unable to place LV lead due to unsatisfactory anatomy.  2. Venography of the coronary sinus     INTRODUCTION:  Frank Mclean is a 74 y.o. male with a nonischemic CM (EF 35), NYHA Class III CHF, and LBBB QRS morophology. At this time, he meets MADIT II/ SCD-HeFT criteria for ICD implantation for primary prevention of sudden death.  Given LBBB, the patient may also be expected to benefit from resynchronization therapy. The patient has been treated with an optimal medical regimen but continues to have a depressed ejection fraction and NYHA Class III CHF symptoms.  he therefore  presents today for a biventricular ICD implantation.      DESCRIPTION OF PROCEDURE:  Informed written consent was obtained and the   patient was brought to the electrophysiology lab in the fasting state. The patient was adequately sedated with intravenous Versed and Fentanyl as outlined in the nursing report.  [name] left chest was prepped and draped in the usual sterile fashion by the EP lab staff.  The skin overlying the left deltopectoral region was infiltrated with lidocaine for local analgesia.  A 6-cm incision was made over the left deltopectoral region.  A left subcutaneous defibrillator pocket was fashioned using a combination of sharp and blunt dissection.  Electrocautery was used to assure hemostasis.    RA/RV Lead Placement: The left axillary vein was cannulated with fluoroscopic visualization.  No contrast was required for this endeavor.  Through the left axillary vein,  a medtronic (serial # [id number]  ) right atrial lead and a medtronic (serial number [id number]) right ventricular defibrillator lead were advanced with fluoroscopic visualization into the right atrial appendage and right ventricular apical septal positions respectively.  Initial atrial lead P-waves measured 6 mV with an impedance of 456 ohms and a threshold of 0.3 volts at 0.5 milliseconds.  The right ventricular lead R-wave measured 15 mV with impedance of 527 ohms and a threshold of 0.5 volts at 0.5 milliseconds.   LV Lead Placement:  A Medtronic guide was advanced through the left axillary vein into the low lateral right atrium. A 6 french hexapolar EP catheter was introduced through the Medtronic guide and used to cannulate the coronary sinus which was very difficult, due to the large RA and very posterior CS take off. A coronary sinus nonselective venogram was performed by hand injection of nonionic contrast. This demonstrated horrible anatomy for LV pacing.  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 The lateral vein was high, tortuous and had an eccentric take off and we spent an hour attempting to cannulate this vein with multiple subselective catheters without success. There were no other veins, which was confirmed with an occlusive venogram.   The medtronic guide was therefore removed.  The RA and ICD leads were secured to the pectoralis fascia using #2 silk suture over the suture sleeves. The pocket then irrigated with copious gentamicin solution.   Device Placement: The leads were then  connected to a Medtronic (  serial  Number [id number]) biventricular ICD.  The defibrillator was placed into the  pocket.  The pocket was then closed in 2 layers with 2.0 Vicryl suture  for the subcutaneous and subcuticular layers.  Steri-Strips and a  sterile dressing were then applied.   DFT Testing: Defibrillation Threshold testing was not performed.      CONCLUSIONS:   1.  Nonischemic cardiomyopathy with Left bundle-branch block and chronic New York Heart Association class III heart failure.   2. Unsuccessful biventricular ICD implantation.   3. No early apparent complications.   Lewayne Bunting, MD  7:13 PM 03/12/2015

## 2015-03-12 NOTE — Progress Notes (Signed)
UR completed 

## 2015-03-12 NOTE — Care Management Note (Signed)
    Page 1 of 2   03/13/2015     12:59:33 PM CARE MANAGEMENT NOTE 03/13/2015  Patient:  Frank Mclean, Frank Mclean   Account Number:  [id number]  Date Initiated:  03/12/2015  Documentation initiated by:  GRAVES-BIGELOW,Gregg Winchell  Subjective/Objective Assessment:   Pt admitted for SOB.     Action/Plan:   CM to continue to monitor for disposition needs.   Anticipated DC Date:  03/14/2015   Anticipated DC Plan:  HOME W HOME HEALTH SERVICES      DC Planning Services  CM consult      [facility] Choice  HOME HEALTH  DURABLE MEDICAL EQUIPMENT   Choice offered to / List presented to:  C-3 Spouse   DME arranged  3-N-1      DME agency  [facility]     [facility] arranged  HH-1 RN  HH-10 DISEASE MANAGEMENT  HH-2 PT  HH-4 NURSE'S AIDE      HH agency  [facility]   Status of service:  Completed, signed off Medicare Important Message given?  YES (If response is "NO", the following Medicare IM given date fields will be blank) Date Medicare IM given:  03/13/2015 Medicare IM given by:  GRAVES-BIGELOW,Mykaela Arena Date Additional Medicare IM given:   Additional Medicare IM given by:    Discharge Disposition:  HOME W HOME HEALTH SERVICES  Per UR Regulation:  Reviewed for med. necessity/level of care/duration of stay  If discussed at Long Length of Stay Meetings, dates discussed:    Comments:  03-13-15 1253 Tomi Bamberger, RN,BSN [phone] CM did speak with pt in ref to [facility] services. Per wife pt gets deconditioned easily. Pt will dc home today via ambulance transport home. Wife agreeable to [facility] via [place]. CM did call to make them aware and orders to be faxed. DME to be  delivered to room. CM did touch base with CSW for transportation needs. Per wife she does work, however will ask family to help assist with caregiver needs. CM did provide family with the Personal Care Provider list. No further needs identified by CM at this time.

## 2015-03-12 NOTE — Progress Notes (Signed)
ANTICOAGULATION CONSULT NOTE - Follow Up Consult  Pharmacy Consult:  Coumadin Indication: atrial fibrillation  Allergies  Allergen Reactions  . Levothyroxine Sodium Other (See Comments)    MUST TAKE BRAND NAME GENERIC DOESN'T WORK FOR PATIENT  . Chicken Protein     Does not like chicken or [place]  . Eggs Or Egg-Derived Products     Does not like eggs    Patient Measurements: Height: 6\' 2"  (188 cm) Weight: 209 lb 14.4 oz (95.21 kg) IBW/kg (Calculated) : 82.2  Vital Signs: Temp: 97.4 F (36.3 C) (03/23 0835) Temp Source: Oral (03/23 0835) BP: 146/88 mmHg (03/23 0839) Pulse Rate: 60 (03/23 0839)  Labs:  Recent Labs  03/10/15 2118 03/11/15 0437 03/12/15 0329  HGB 10.7*  --   --   HCT 35.4*  --   --   PLT 256  --   --   LABPROT 21.4* 21.9* 24.7*  INR 1.83* 1.89* 2.21*  CREATININE 2.46*  --  2.39*    Estimated Creatinine Clearance: 31 mL/min (by C-G formula based on Cr of 2.39).   Assessment: 27 YOM with here with HF and with history of Afib to continue on Coumadin.  INR is noted at 2.21 and up from 1.89 on 3/22.  Patient is noted for CRT-D today.   Home coumadin dose: 5mg  daily except 2.5 mg Tues/Fri  Goal of Therapy:  INR 2-3 Monitor platelets by anticoagulation protocol: Yes   Plan:  - Coumadin 2.5mg  PO today - Daily PT / INR  Harland German, Pharm D 03/12/2015 10:44 AM

## 2015-03-13 ENCOUNTER — Other Ambulatory Visit: Payer: Self-pay | Admitting: Cardiology

## 2015-03-13 ENCOUNTER — Inpatient Hospital Stay (HOSPITAL_COMMUNITY): Payer: Medicare Other

## 2015-03-13 ENCOUNTER — Telehealth: Payer: Self-pay | Admitting: Cardiology

## 2015-03-13 ENCOUNTER — Encounter (HOSPITAL_COMMUNITY): Payer: Self-pay | Admitting: Internal Medicine

## 2015-03-13 ENCOUNTER — Encounter (HOSPITAL_COMMUNITY): Payer: Self-pay | Admitting: *Deleted

## 2015-03-13 ENCOUNTER — Telehealth: Payer: Self-pay | Admitting: *Deleted

## 2015-03-13 ENCOUNTER — Inpatient Hospital Stay (HOSPITAL_COMMUNITY)
Admission: EM | Admit: 2015-03-13 | Discharge: 2015-03-17 | DRG: 871 | Disposition: A | Discharge status: Skilled Nursing Facility | Payer: Medicare Other | Attending: Internal Medicine | Admitting: Internal Medicine

## 2015-03-13 DIAGNOSIS — I6350 Cerebral infarction due to unspecified occlusion or stenosis of unspecified cerebral artery: Secondary | ICD-10-CM | POA: Diagnosis present

## 2015-03-13 DIAGNOSIS — N185 Chronic kidney disease, stage 5: Secondary | ICD-10-CM | POA: Diagnosis present

## 2015-03-13 DIAGNOSIS — M1990 Unspecified osteoarthritis, unspecified site: Secondary | ICD-10-CM | POA: Diagnosis present

## 2015-03-13 DIAGNOSIS — I480 Paroxysmal atrial fibrillation: Secondary | ICD-10-CM | POA: Diagnosis present

## 2015-03-13 DIAGNOSIS — N183 Chronic kidney disease, stage 3 unspecified: Secondary | ICD-10-CM | POA: Diagnosis present

## 2015-03-13 DIAGNOSIS — I429 Cardiomyopathy, unspecified: Secondary | ICD-10-CM | POA: Diagnosis present

## 2015-03-13 DIAGNOSIS — E1142 Type 2 diabetes mellitus with diabetic polyneuropathy: Secondary | ICD-10-CM | POA: Diagnosis not present

## 2015-03-13 DIAGNOSIS — J449 Chronic obstructive pulmonary disease, unspecified: Secondary | ICD-10-CM | POA: Diagnosis present

## 2015-03-13 DIAGNOSIS — I739 Peripheral vascular disease, unspecified: Secondary | ICD-10-CM | POA: Diagnosis present

## 2015-03-13 DIAGNOSIS — Z881 Allergy status to other antibiotic agents status: Secondary | ICD-10-CM | POA: Diagnosis not present

## 2015-03-13 DIAGNOSIS — E1122 Type 2 diabetes mellitus with diabetic chronic kidney disease: Secondary | ICD-10-CM | POA: Diagnosis not present

## 2015-03-13 DIAGNOSIS — Z7901 Long term (current) use of anticoagulants: Secondary | ICD-10-CM | POA: Diagnosis not present

## 2015-03-13 DIAGNOSIS — R4182 Altered mental status, unspecified: Secondary | ICD-10-CM

## 2015-03-13 DIAGNOSIS — R5381 Other malaise: Secondary | ICD-10-CM | POA: Diagnosis not present

## 2015-03-13 DIAGNOSIS — R531 Weakness: Secondary | ICD-10-CM

## 2015-03-13 DIAGNOSIS — E538 Deficiency of other specified B group vitamins: Secondary | ICD-10-CM | POA: Diagnosis present

## 2015-03-13 DIAGNOSIS — I2510 Atherosclerotic heart disease of native coronary artery without angina pectoris: Secondary | ICD-10-CM | POA: Diagnosis present

## 2015-03-13 DIAGNOSIS — F329 Major depressive disorder, single episode, unspecified: Secondary | ICD-10-CM | POA: Diagnosis present

## 2015-03-13 DIAGNOSIS — I5023 Acute on chronic systolic (congestive) heart failure: Secondary | ICD-10-CM | POA: Diagnosis not present

## 2015-03-13 DIAGNOSIS — R918 Other nonspecific abnormal finding of lung field: Secondary | ICD-10-CM | POA: Diagnosis not present

## 2015-03-13 DIAGNOSIS — E039 Hypothyroidism, unspecified: Secondary | ICD-10-CM | POA: Diagnosis present

## 2015-03-13 DIAGNOSIS — J811 Chronic pulmonary edema: Secondary | ICD-10-CM | POA: Diagnosis not present

## 2015-03-13 DIAGNOSIS — I517 Cardiomegaly: Secondary | ICD-10-CM | POA: Diagnosis not present

## 2015-03-13 DIAGNOSIS — I5043 Acute on chronic combined systolic (congestive) and diastolic (congestive) heart failure: Secondary | ICD-10-CM | POA: Insufficient documentation

## 2015-03-13 DIAGNOSIS — A419 Sepsis, unspecified organism: Secondary | ICD-10-CM | POA: Diagnosis present

## 2015-03-13 DIAGNOSIS — Z6827 Body mass index (BMI) 27.0-27.9, adult: Secondary | ICD-10-CM

## 2015-03-13 DIAGNOSIS — Z8673 Personal history of transient ischemic attack (TIA), and cerebral infarction without residual deficits: Secondary | ICD-10-CM | POA: Diagnosis not present

## 2015-03-13 DIAGNOSIS — E46 Unspecified protein-calorie malnutrition: Secondary | ICD-10-CM | POA: Insufficient documentation

## 2015-03-13 DIAGNOSIS — D649 Anemia, unspecified: Secondary | ICD-10-CM | POA: Diagnosis present

## 2015-03-13 DIAGNOSIS — Z95810 Presence of automatic (implantable) cardiac defibrillator: Secondary | ICD-10-CM | POA: Diagnosis present

## 2015-03-13 DIAGNOSIS — E785 Hyperlipidemia, unspecified: Secondary | ICD-10-CM | POA: Diagnosis present

## 2015-03-13 DIAGNOSIS — Z91012 Allergy to eggs: Secondary | ICD-10-CM

## 2015-03-13 DIAGNOSIS — E440 Moderate protein-calorie malnutrition: Secondary | ICD-10-CM | POA: Diagnosis present

## 2015-03-13 DIAGNOSIS — Z87891 Personal history of nicotine dependence: Secondary | ICD-10-CM | POA: Diagnosis not present

## 2015-03-13 DIAGNOSIS — I120 Hypertensive chronic kidney disease with stage 5 chronic kidney disease or end stage renal disease: Secondary | ICD-10-CM | POA: Diagnosis present

## 2015-03-13 DIAGNOSIS — E870 Hyperosmolality and hypernatremia: Secondary | ICD-10-CM | POA: Diagnosis present

## 2015-03-13 DIAGNOSIS — J189 Pneumonia, unspecified organism: Secondary | ICD-10-CM | POA: Diagnosis present

## 2015-03-13 DIAGNOSIS — I872 Venous insufficiency (chronic) (peripheral): Secondary | ICD-10-CM | POA: Diagnosis present

## 2015-03-13 DIAGNOSIS — I447 Left bundle-branch block, unspecified: Secondary | ICD-10-CM | POA: Diagnosis not present

## 2015-03-13 DIAGNOSIS — K219 Gastro-esophageal reflux disease without esophagitis: Secondary | ICD-10-CM | POA: Diagnosis present

## 2015-03-13 DIAGNOSIS — I5022 Chronic systolic (congestive) heart failure: Secondary | ICD-10-CM | POA: Diagnosis not present

## 2015-03-13 DIAGNOSIS — R7989 Other specified abnormal findings of blood chemistry: Secondary | ICD-10-CM | POA: Diagnosis present

## 2015-03-13 DIAGNOSIS — R269 Unspecified abnormalities of gait and mobility: Secondary | ICD-10-CM

## 2015-03-13 DIAGNOSIS — E1129 Type 2 diabetes mellitus with other diabetic kidney complication: Secondary | ICD-10-CM | POA: Diagnosis present

## 2015-03-13 DIAGNOSIS — M109 Gout, unspecified: Secondary | ICD-10-CM | POA: Diagnosis present

## 2015-03-13 DIAGNOSIS — I5042 Chronic combined systolic (congestive) and diastolic (congestive) heart failure: Secondary | ICD-10-CM | POA: Diagnosis present

## 2015-03-13 DIAGNOSIS — R778 Other specified abnormalities of plasma proteins: Secondary | ICD-10-CM | POA: Diagnosis present

## 2015-03-13 DIAGNOSIS — N189 Chronic kidney disease, unspecified: Secondary | ICD-10-CM

## 2015-03-13 DIAGNOSIS — R0602 Shortness of breath: Secondary | ICD-10-CM | POA: Diagnosis not present

## 2015-03-13 DIAGNOSIS — N400 Enlarged prostate without lower urinary tract symptoms: Secondary | ICD-10-CM | POA: Diagnosis present

## 2015-03-13 DIAGNOSIS — J4489 Other specified chronic obstructive pulmonary disease: Secondary | ICD-10-CM | POA: Diagnosis present

## 2015-03-13 DIAGNOSIS — Z5181 Encounter for therapeutic drug level monitoring: Secondary | ICD-10-CM | POA: Diagnosis not present

## 2015-03-13 LAB — GLUCOSE, CAPILLARY
GLUCOSE-CAPILLARY: 111 mg/dL — AB (ref 70–99)
Glucose-Capillary: 101 mg/dL — ABNORMAL HIGH (ref 70–99)

## 2015-03-13 LAB — BASIC METABOLIC PANEL
ANION GAP: 10 (ref 5–15)
Anion gap: 11 (ref 5–15)
BUN: 22 mg/dL (ref 6–23)
BUN: 20 mg/dL (ref 6–23)
CHLORIDE: 110 mmol/L (ref 96–112)
CO2: 24 mmol/L (ref 19–32)
CO2: 27 mmol/L (ref 19–32)
Calcium: 8.9 mg/dL (ref 8.4–10.5)
Calcium: 9 mg/dL (ref 8.4–10.5)
Chloride: 110 mmol/L (ref 96–112)
Creatinine, Ser: 2.28 mg/dL — ABNORMAL HIGH (ref 0.50–1.35)
Creatinine, Ser: 2.35 mg/dL — ABNORMAL HIGH (ref 0.50–1.35)
GFR calc Af Amer: 31 mL/min — ABNORMAL LOW (ref >90)
GFR calc non Af Amer: 25 mL/min — ABNORMAL LOW (ref >90)
GFR, EST AFRICAN AMERICAN: 29 mL/min — AB (ref >90)
GFR, EST NON AFRICAN AMERICAN: 26 mL/min — AB (ref >90)
GLUCOSE: 120 mg/dL — AB (ref 70–99)
Glucose, Bld: 114 mg/dL — ABNORMAL HIGH (ref 70–99)
POTASSIUM: 3.8 mmol/L (ref 3.5–5.1)
Potassium: 3.6 mmol/L (ref 3.5–5.1)
Sodium: 145 mmol/L (ref 135–145)
Sodium: 147 mmol/L — ABNORMAL HIGH (ref 135–145)

## 2015-03-13 LAB — PROTIME-INR
INR: 2.56 — ABNORMAL HIGH (ref 0.00–1.49)
Prothrombin Time: 27.7 seconds — ABNORMAL HIGH (ref 11.6–15.2)

## 2015-03-13 MED ORDER — WARFARIN SODIUM 2.5 MG PO TABS: 2.5000 mg | ORAL_TABLET | Freq: Once | ORAL | Status: DC

## 2015-03-13 MED ORDER — CARVEDILOL 12.5 MG PO TABS: 12.5000 mg | ORAL_TABLET | Freq: Two times a day (BID) | ORAL | Status: DC

## 2015-03-13 MED ORDER — TORSEMIDE 20 MG PO TABS: 20.0000 mg | ORAL_TABLET | Freq: Two times a day (BID) | ORAL | Status: DC

## 2015-03-13 MED ORDER — SODIUM CHLORIDE 0.9 % IV SOLN
INTRAVENOUS | Status: DC
Start: 2015-03-13 — End: 2015-03-13

## 2015-03-13 MED ORDER — SODIUM CHLORIDE 0.9 % IR SOLN: 80.0000 mg | Status: DC

## 2015-03-13 MED ORDER — SODIUM CHLORIDE 0.9 % IJ SOLN: 3.0000 mL | Freq: Two times a day (BID) | INTRAMUSCULAR | Status: DC

## 2015-03-13 MED ORDER — CEFAZOLIN SODIUM-DEXTROSE 2-3 GM-% IV SOLR: 2.0000 g | INTRAVENOUS | Status: DC

## 2015-03-13 MED ORDER — SODIUM CHLORIDE 0.9 % IV BOLUS (SEPSIS)
500.0000 mL | Freq: Once | INTRAVENOUS | Status: AC
  Administered 2015-03-13: 500 mL via INTRAVENOUS

## 2015-03-13 MED ORDER — CHLORHEXIDINE GLUCONATE 4 % EX LIQD: 60.0000 mL | Freq: Once | CUTANEOUS | Status: DC

## 2015-03-13 MED ORDER — SODIUM CHLORIDE 0.9 % IJ SOLN: 3.0000 mL | INTRAMUSCULAR | Status: DC | PRN

## 2015-03-13 MED ORDER — SODIUM CHLORIDE 0.9 % IV SOLN: 250.0000 mL | INTRAVENOUS | Status: DC

## 2015-03-13 NOTE — Evaluation (Addendum)
Physical Therapy Evaluation Patient Details Name: Frank Mclean MRN: [id number] DOB: 06-Mar-1941 Today's Date: 03/13/2015   History of Present Illness  74 yo male with LBBB was fitted with defib with incomplete insertion, revision next week  Clinical Impression  Pt was seen for assessment and is demonstrating a dependent presentation for transfers, but is willing to go to SNF.  His plan is to refer to SW/case management an dwill continue therapy until DC to ensure the best advantage before transition.  Pt was already having increased difficulty with gait prior to his admission.    Follow Up Recommendations Supervision/Assistance - 24 hour;CIR    Equipment Recommendations  None recommended by PT    Recommendations for Other Services Rehab consult     Precautions / Restrictions Precautions Precautions: Fall;ICD/Pacemaker Precaution Comments: LUE in sling Required Braces or Orthoses: Sling Restrictions Weight Bearing Restrictions: Yes Other Position/Activity Restrictions: L arm in sling      Mobility  Bed Mobility Overal bed mobility: Needs Assistance Bed Mobility: Supine to Sit;Rolling Rolling: Min assist;Mod assist   Supine to sit: Max assist     General bed mobility comments: using bedrails and PT helping to sequence  Transfers Overall transfer level: Needs assistance Equipment used: 1 person hand held assist Transfers: Sit to/from Stand Sit to Stand: Total assist         General transfer comment: Pt in knee pain B with R > L and could not come up off hips on bed  Ambulation/Gait             General Gait Details: unable  Stairs            Wheelchair Mobility    Modified Rankin (Stroke Patients Only)       Balance Overall balance assessment: Modified Independent (for sitting only)                                           Pertinent Vitals/Pain Pain Assessment: 0-10 Pain Score: 3  Pain Location: chest Pain  Intervention(s): Limited activity within patient's tolerance;Monitored during session;Premedicated before session;Repositioned    Home Living Family/patient expects to be discharged to:: Private residence Living Arrangements: Spouse/significant other Available Help at Discharge: Family Type of Home: House Home Access: Stairs to enter Entrance Stairs-Rails: Right;Left;Can reach both Secretary/administrator of Steps: 5 Home Layout: Multi-level Home Equipment: Environmental consultant - 2 wheels;Cane - single point      Prior Function Level of Independence: Independent with assistive device(s)               Hand Dominance        Extremity/Trunk Assessment   Upper Extremity Assessment: LUE deficits/detail       LUE Deficits / Details: in sling since placement of defib   Lower Extremity Assessment: Generalized weakness      Cervical / Trunk Assessment: Normal  Communication   Communication: No difficulties  Cognition Arousal/Alertness: Awake/alert Behavior During Therapy: WFL for tasks assessed/performed Overall Cognitive Status: Within Functional Limits for tasks assessed                      General Comments General comments (skin integrity, edema, etc.): Pt is having a tough time trying to stand due to knee pain and some difficulty with chest post procedure.  His plan is to go to SNF as his wife will be out of  town this weekend and he is planning to be home with different family members.    Exercises        Assessment/Plan    PT Assessment Patient needs continued PT services  PT Diagnosis Generalized weakness;Acute pain   PT Problem List Decreased strength;Decreased range of motion;Decreased activity tolerance;Decreased balance;Decreased mobility;Decreased coordination;Decreased safety awareness;Decreased knowledge of precautions;Cardiopulmonary status limiting activity;Decreased skin integrity;Pain  PT Treatment Interventions DME instruction;Gait training;Stair  training;Functional mobility training;Therapeutic activities;Therapeutic exercise;Balance training;Neuromuscular re-education;Patient/family education   PT Goals (Current goals can be found in the Care Plan section) Acute Rehab PT Goals Patient Stated Goal: to get up and in chair PT Goal Formulation: With patient Time For Goal Achievement: 03/27/15 Potential to Achieve Goals: Good    Frequency Min 3X/week   Barriers to discharge Inaccessible home environment;Decreased caregiver support      Co-evaluation               End of Session Equipment Utilized During Treatment: Oxygen;Gait belt Activity Tolerance: Patient tolerated treatment well;Patient limited by pain;Patient limited by fatigue Patient left: in bed;with call bell/phone within reach Nurse Communication: Mobility status         Time: 7076-1518 PT Time Calculation (min) (ACUTE ONLY): 25 min   Charges:   PT Evaluation $Initial PT Evaluation Tier I: 1 Procedure PT Treatments $Therapeutic Activity: 8-22 mins   PT G Codes:        Ivar Drape 04/06/2015, 12:32 PM   Samul Dada, PT MS Acute Rehab Dept. Number: [phone]

## 2015-03-13 NOTE — Progress Notes (Signed)
Patient ID: Frank Mclean, male   DOB: 1941/04/13, 74 y.o.   MRN: [id number]    Patient Name: Frank Mclean Date of Encounter: 03/13/2015     Active Problems:   Acute on chronic systolic heart failure   Acute on chronic systolic congestive heart failure    SUBJECTIVE  Stable after attempted Biv ICD. No chest pain or sob.  CURRENT MEDS . amLODipine  10 mg Oral Daily  . atorvastatin  20 mg Oral Daily  . budesonide-formoterol  2 puff Inhalation BID  . carvedilol  12.5 mg Oral BID WC  .  ceFAZolin (ANCEF) IV  1 g Intravenous Q6H  . cholecalciferol  1,000 Units Oral Daily  . escitalopram  5 mg Oral QHS  . ferrous sulfate  325 mg Oral Q breakfast  . glipiZIDE  5 mg Oral QAC breakfast  . hydrALAZINE  100 mg Oral 3 times per day  . isosorbide mononitrate  90 mg Oral BID WC  . levothyroxine  100 mcg Oral QAC breakfast  . potassium chloride SA  60 mEq Oral BID  . torsemide  20 mg Oral QPM  . torsemide  40 mg Oral Q breakfast  . Warfarin - Pharmacist Dosing Inpatient   Does not apply q1800    OBJECTIVE  Filed Vitals:   03/13/15 0105 03/13/15 0205 03/13/15 0250 03/13/15 0354  BP: 124/76 127/74 137/76 135/79  Pulse: 57 57 65 61  Temp:    98.4 F (36.9 C)  TempSrc:    Oral  Resp: Height:    6' 2.5" (1.892 m)  Weight:    203 lb (92.08 kg)  SpO2: 99% 100% 99% 96%    Intake/Output Summary (Last 24 hours) at 03/13/15 0815 Last data filed at 03/13/15 0700  Gross per 24 hour  Intake    103 ml  Output   1175 ml  Net  -1072 ml   Filed Weights   03/10/15 2015 03/12/15 0511 03/13/15 0354  Weight: 214 lb (97.07 kg) 209 lb 14.4 oz (95.21 kg) 203 lb (92.08 kg)    PHYSICAL EXAM  General: Pleasant, NAD. Neuro: Alert and oriented X 3. Moves all extremities spontaneously. Psych: Normal affect. HEENT:  Normal  Neck: Supple without bruits or JVD. Lungs:  Resp regular and unlabored, CTA. Heart: RRR no s3, s4, or murmurs. Abdomen: Soft, non-tender, non-distended, BS + x  4.  Extremities: No clubbing, cyanosis or edema. DP/PT/Radials 2+ and equal bilaterally.  Accessory Clinical Findings  CBC  Recent Labs  03/10/15 2118  WBC 9.5  NEUTROABS 8.2*  HGB 10.7*  HCT 35.4*  MCV 87.4  PLT 256   Basic Metabolic Panel  Recent Labs  03/12/15 0329 03/13/15 0650  NA 146* 145  K 4.3 3.8  CL 109 110  CO2 27 24  GLUCOSE 89 120*  BUN 18 22  CREATININE 2.39* 2.28*  CALCIUM 8.9 8.9   Liver Function Tests No results for input(s): AST, ALT, ALKPHOS, BILITOT, PROT, ALBUMIN in the last 72 hours. No results for input(s): LIPASE, AMYLASE in the last 72 hours. Cardiac Enzymes No results for input(s): CKTOTAL, CKMB, CKMBINDEX, TROPONINI in the last 72 hours. BNP Invalid input(s): POCBNP D-Dimer No results for input(s): DDIMER in the last 72 hours. Hemoglobin A1C No results for input(s): HGBA1C in the last 72 hours. Fasting Lipid Panel No results for input(s): CHOL, HDL, LDLCALC, TRIG, CHOLHDL, LDLDIRECT in the last 72 hours. Thyroid Function Tests No results for input(s): TSH,  T4TOTAL, T3FREE, THYROIDAB in the last 72 hours.  Invalid input(s): FREET3  TELE nsr with lbbb  Radiology/Studies  Dg Chest 2 View  03/13/2015   CLINICAL DATA:  74 year old male status post pacemaker placement  EXAM: CHEST  2 VIEW  COMPARISON:  Preoperative chest x-ray 03/10/2015  FINDINGS: New left subclavian approach cardiac rhythm maintenance device. Leads project over the right atrium and right ventricle. No evidence of pneumothorax or hemothorax. Stable cardiomegaly. Stable mediastinal contours. Atherosclerotic calcifications in the aorta. Pulmonary hyperexpansion and central bronchitic change. No pulmonary edema. No acute osseous abnormality.  IMPRESSION: 1. New left subclavian approach cardiac rhythm maintenance device with leads projecting over the right atrium and right ventricle. 2. No evidence of pneumothorax, hemothorax or other complication.   Electronically Signed    By: Malachy Moan M.D.   On: 03/13/2015 07:43   Dg Chest 2 View  03/10/2015   CLINICAL DATA:  Acute onset of shortness of breath. Initial encounter.  EXAM: CHEST  2 VIEW  COMPARISON:  Chest radiograph performed 11/27/2013  FINDINGS: The lungs are well-aerated. Vascular congestion is noted, with bilateral central airspace opacities and a small left pleural effusion, likely reflecting mild recurrent pulmonary edema. No pneumothorax is seen.  The heart is enlarged.  No acute osseous abnormalities are seen.  IMPRESSION: Vascular congestion and cardiomegaly, with bilateral central airspace opacities and a small left pleural effusion, likely reflecting mild recurrent pulmonary edema.   Electronically Signed   By: Roanna Raider M.D.   On: 03/10/2015 22:54    ASSESSMENT AND PLAN  1. Acute on chronic systolic heart failure 2. S/p ICD implant with unsuccessful attempted LV lead insertion 3. Chronic renal insufficiency Rec: ok for discharge from my perspective. I discussed treatment options going forward. It is reasonable to refer to Dr. Laneta Simmers. He has a wide LBBB QRS and would be a reasonable  Cire Deyarmin,M.D.  03/13/2015 8:15 AM

## 2015-03-13 NOTE — Discharge Summary (Signed)
Discharge Summary   Patient ID: Frank Mclean MRN: [id number], DOB/AGE: [birth year] 74 y.o. Admit date: 03/10/2015 D/C date:     03/13/2015  Primary Cardiologist: Hochrein HF: Shirlee Latch EP: Ladona Ridgel  Principal Problem:   Acute on chronic systolic heart failure Active Problems:   DM (diabetes mellitus), type 2 with renal complications   HLD (hyperlipidemia)   GERD   CKD (chronic kidney disease) stage 3, GFR 30-59 ml/min   Memory loss   LBBB (left bundle branch block)   CAD (coronary artery disease)   NICM (nonischemic cardiomyopathy)   Peripheral vascular disease   PAF (paroxysmal atrial fibrillation)   Hypothyroidism    Admission Dates: 03/10/15-03/13/15 Discharge Diagnosis: acute on chronic systolic CHF and attempted placement of BiV ICD.  Discharge weight 203 lbs.    HPI: Frank Mclean is a 74 y.o. male with a history of HTN, DM, non-obstructive CAD, NICM (EF 30-35%, 3/16) with LBBB and NYHA class III CHF with planned CRT implant (4/14, Ladona Ridgel), CKD IV, CVA, PVD, PAF on amiodarone/ coumadin, chronic venous stasis ulcers, who presented 03/10/15 with dyspnea.  He had a previously scheduled BiV ICD placement in April with Dr. Ladona Ridgel.   Hospital Course  1. Chronic systolic CHF: Nonischemic cardiomyopathy. EF 30-35% with moderate MR and moderately decreased systolic function on 02/26/15. He has a chronic LBBB, very wide QRS. He was admitted with increased dyspnea, probably mildly volume overloaded and now doing better.  -- He diuresed well on IV lasix. Net neg 2.9 L. Weight down 11 lbs ( 214--> 203 lbs). Discharge weight 203 lbs.  -- Now has [employer] ICD and was evaluated by EP during his admission who attempted to place BIV ICD on 03/12/15. However, they were unable to place LV lead due to lack of appropriate veins. Only option will be epicardial LV lead, would have to have this placed by cardiac surgeon.He will be evaluated for this but he will be relatively higher risk with  significant CKD in addition to his CHF. I have made appointment with Dr. Laneta Simmers on 03/26/15.  -- Continue current Coreg and hydralazine/Imdur.  -- No ACEI or spironolactone with CKD.  -- Will increase home torsemide to 40 qam/20 qpm at discharge.   2. CAD: Patient had cardiac cath in 4/12 with mild nonobstructive CAD. He has had no chest pain. Given increased exertional dyspnea, he had Cardiolite in 1/16. This showed a primarily fixed large inferior and apical perfusion defect. This likely represents prior infarction with minimal peri-infarct ischemia (versus artifact). Dr. Shirlee Latch talked at length about coronary angiography today. Given lack of chest pain and minimal ischemia, it was elected to hold off on coronary angiography in the setting of CKD stage III-IV.  -- He is on warfarin so not on ASA.  -- Continue statin.   3. Paroxysmal atrial fibrillation:  He is in NSR today. He did have a brief run of PAF while in the ED. -- Continue warfarin. INR 2.56 today. Will have INR checked on Monday  -- Continue Coreg. 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 This is now under control.   4. CKD: Stage III-IV. Follows with Dr Lowell Guitar. Stable here.Creat 2.28 today.   5. Disposition: PT recommended SNF due to gait instability and deconditioning but hospital not covering this and patient unable to pay out of pocket. Will go home with home health PT, RN and AID and will get a ride home from ambulance.  -- Cardiac  meds for home: Warfarin per coumadin clinic, torsemide 40 qam/20 qpm, amlodipine 10 daily, atorvastatin 20 daily, hydralazine 100 tid, Imdur 90 daily, Coreg 12.5 mg bid, KCl 60 bid, and PRN metolazone as well as noncardiac meds as prior to admission.   The patient has had an uncomplicated hospital course and is recovering well. He has been seen by Dr. Shirlee Latch today and deemed ready for discharge home with home health services. All follow-up  appointments have been scheduled. Discharge medications are listed below.   Discharge Vitals: Blood pressure 135/79, pulse 61, temperature 98.4 F (36.9 C), temperature source Oral, resp. rate 20, height 6' 2.5" (1.892 m), weight 203 lb (92.08 kg), SpO2 96 %.  Labs: Lab Results  Component Value Date   WBC 9.5 03/10/2015   HGB 10.7* 03/10/2015   HCT 35.4* 03/10/2015   MCV 87.4 03/10/2015   PLT 256 03/10/2015     Recent Labs Lab 03/13/15 1030  NA 147*  K 3.6  CL 110  CO2 27  BUN 20  CREATININE 2.35*  CALCIUM 9.0  GLUCOSE 114*   No results for input(s): CKTOTAL, CKMB, TROPONINI in the last 72 hours. Lab Results  Component Value Date   CHOL 269* 06/24/2014   HDL 56.50 06/24/2014   LDLCALC 186* 06/24/2014   TRIG 133.0 06/24/2014   No results found for: DDIMER  Diagnostic Studies/Procedures   Dg Chest 2 View  03/13/2015   CLINICAL DATA:  74 year old male status post pacemaker placement  EXAM: CHEST  2 VIEW  COMPARISON:  Preoperative chest x-ray 03/10/2015  FINDINGS: New left subclavian approach cardiac rhythm maintenance device. Leads project over the right atrium and right ventricle. No evidence of pneumothorax or hemothorax. Stable cardiomegaly. Stable mediastinal contours. Atherosclerotic calcifications in the aorta. Pulmonary hyperexpansion and central bronchitic change. No pulmonary edema. No acute osseous abnormality.  IMPRESSION: 1. New left subclavian approach cardiac rhythm maintenance device with leads projecting over the right atrium and right ventricle. 2. No evidence of pneumothorax, hemothorax or other complication.   Electronically Signed   By: Malachy Moan M.D.   On: 03/13/2015 07:43   Dg Chest 2 View  03/10/2015   CLINICAL DATA:  Acute onset of shortness of breath. Initial encounter.  EXAM: CHEST  2 VIEW  COMPARISON:  Chest radiograph performed 11/27/2013  FINDINGS: The lungs are well-aerated. Vascular congestion is noted, with bilateral central airspace  opacities and a small left pleural effusion, likely reflecting mild recurrent pulmonary edema. No pneumothorax is seen.  The heart is enlarged.  No acute osseous abnormalities are seen.  IMPRESSION: Vascular congestion and cardiomegaly, with bilateral central airspace opacities and a small left pleural effusion, likely reflecting mild recurrent pulmonary edema.   Electronically Signed   By: Roanna Raider M.D.   On: 03/10/2015 22:54    Discharge Medications     Medication List    TAKE these medications        amLODipine 10 MG tablet  Commonly known as:  NORVASC  Take 10 mg by mouth daily.     atorvastatin 20 MG tablet  Commonly known as:  LIPITOR  Take 1 tablet (20 mg total) by mouth daily.     carvedilol 12.5 MG tablet  Commonly known as:  COREG  Take 1 tablet (12.5 mg total) by mouth 2 (two) times daily with a meal.     cholecalciferol 1000 UNITS tablet  Commonly known as:  VITAMIN D  Take 1,000 Units by mouth daily.     [facility]  VITAMIN B12 1000 MCG tablet  Generic drug:  cyanocobalamin  TAKE 1 TABLET BY MOUTH DAILY     escitalopram 5 MG tablet  Commonly known as:  LEXAPRO  Take 1 tablet (5 mg total) by mouth daily.     ferrous sulfate 325 (65 FE) MG tablet  TAKE 1 TABLET BY MOUTH EVERY DAY     glipiZIDE 5 MG tablet  Commonly known as:  GLUCOTROL  TAKE 1/2 TABLET BY MOUTH TWICE A DAY BEFORE A MEAL     hydrALAZINE 100 MG tablet  Commonly known as:  APRESOLINE  Take 1 tablet (100 mg total) by mouth 3 (three) times daily.     isosorbide mononitrate 60 MG 24 hr tablet  Commonly known as:  IMDUR  Take 1.5 tablets for 90 mg by mouth twice a day.     metolazone 5 MG tablet  Commonly known as:  ZAROXOLYN  Take 5 mg by mouth as needed (fluid).     OXYGEN  - Inhale 2.5 L into the lungs See admin instructions. USES OXYGEN EVERY BEDTIME  - USES DURING DAY ONLY AS NEEDED     potassium chloride SA 20 MEQ tablet  Commonly known as:  KLOR-CON M20  Take 3 tabs TWICE DAILY       SYMBICORT 160-4.5 MCG/ACT inhaler  Generic drug:  budesonide-formoterol  INHALE 2 PUFFS INTO THE LUNGS 2 TIMES DAILY     SYNTHROID 100 MCG tablet  Generic drug:  levothyroxine  TAKE 2 TABLETS BY MOUTH EVERY MORNING BEFORE BREAKFAST     torsemide 20 MG tablet  Commonly known as:  DEMADEX  Take 1-2 tablets (20-40 mg total) by mouth 2 (two) times daily. Please take 20mg  in the AM and 40 in the PM     warfarin 5 MG tablet  Commonly known as:  COUMADIN  TAKE 1 TABLET (5 MG TOTAL) BY MOUTH AS DIRECTED.        Disposition   The patient will be discharged in stable condition to home.  Follow-up Information    Follow up with Marca Ancona, MD On 03/20/2015.   Specialty:  Cardiology   Why:  at 0940 in the [facility]--gate code 7000--Bring all medications to appt   Contact information:   [address]  [address] [place] [address] [phone]       Follow up with Alleen Borne, MD On 03/26/2015.   Specialty:  Cardiothoracic Surgery   Why:  @ please arrive at 2:45 am for your 3 pm appointment   Contact information:   [address] [employer] [address] [place] [address] [phone]       Follow up with [facility] On 03/17/2015.   Why:  @ 7:45 am      Contact information:   [address] [place] [address] [phone]      Follow up with [facility].   Why:  Bedside [facility]   Contact information:   [address] [place] [address] [phone]       Follow up with [facility].   Why:  Registered Nurse, Physical Therapy and Aide.    Contact information:   [address] [place] [address] [phone]         Duration of Discharge Encounter: Greater than 30 minutes including physician and PA time.  Shari Heritage, Avantika Shere R PA-C 03/13/2015, 1:11 PM

## 2015-03-13 NOTE — ED Notes (Addendum)
Per wife: pt has been disoriented at home this afternoon and fell while ambulating at home. Denies LOC. Also had an episode of NV, has not had nighttime medications due to vomiting Wife also states that pacemaker wires are not connected at this time.

## 2015-03-13 NOTE — Progress Notes (Signed)
ANTICOAGULATION CONSULT NOTE - Follow Up Consult  Pharmacy Consult:  Coumadin Indication: atrial fibrillation  Allergies  Allergen Reactions  . Levothyroxine Sodium Other (See Comments)    MUST TAKE BRAND NAME GENERIC DOESN'T WORK FOR PATIENT  . Chicken Protein     Does not like chicken or [place]  . Eggs Or Egg-Derived Products     Does not like eggs    Patient Measurements: Height: 6' 2.5" (189.2 cm) Weight: 203 lb (92.08 kg) IBW/kg (Calculated) : 83.35  Vital Signs: Temp: 98.4 F (36.9 C) (03/24 0354) Temp Source: Oral (03/24 0354) BP: 135/79 mmHg (03/24 0354) Pulse Rate: 61 (03/24 0354)  Labs:  Recent Labs  03/10/15 2118 03/11/15 0437 03/12/15 0329 03/13/15 0650  HGB 10.7*  --   --   --   HCT 35.4*  --   --   --   PLT 256  --   --   --   LABPROT 21.4* 21.9* 24.7* 27.7*  INR 1.83* 1.89* 2.21* 2.56*  CREATININE 2.46*  --  2.39* 2.28*    Estimated Creatinine Clearance: 33 mL/min (by C-G formula based on Cr of 2.28).   Assessment: 19 YOM with here with HF and with history of Afib to continue on Coumadin.  INR is noted at 2.56 and up from 1.89 on 3/22.  Noted s/p ICD 3/24 but unable to place LV lead.   Home coumadin dose: 5mg  daily except 2.5 mg Tues/Fri  Goal of Therapy:  INR 2-3 Monitor platelets by anticoagulation protocol: Yes   Plan:  - Coumadin 2.5mg  PO today - Daily PT / INR  Harland German, Pharm D 03/13/2015 10:21 AM

## 2015-03-13 NOTE — Telephone Encounter (Signed)
Pt was on tcm list d/c 03/13/15 had chronic systolic CHF & attempted placement of BiV ICD. Pt will have hosp f/u with cardiology on 03/17/15. Did not schedule TCM ppt...Raechel Chute

## 2015-03-13 NOTE — Progress Notes (Signed)
Patient ID: Frank Mclean, male   DOB: 11/15/1941, 74 y.o.   MRN: [id number]   SUBJECTIVE: Breathing ok.  Went for CRT-D yesterday but unable to place LV lead, no appropriate veins.  He has bilateral knee pain and there is a question about whether he is going to be able to get up to walk with his arm in a sling.     Scheduled Meds: . amLODipine  10 mg Oral Daily  . atorvastatin  20 mg Oral Daily  . budesonide-formoterol  2 puff Inhalation BID  . carvedilol  12.5 mg Oral BID WC  .  ceFAZolin (ANCEF) IV  1 g Intravenous Q6H  . cholecalciferol  1,000 Units Oral Daily  . escitalopram  5 mg Oral QHS  . ferrous sulfate  325 mg Oral Q breakfast  . glipiZIDE  5 mg Oral QAC breakfast  . hydrALAZINE  100 mg Oral 3 times per day  . isosorbide mononitrate  90 mg Oral BID WC  . levothyroxine  100 mcg Oral QAC breakfast  . potassium chloride SA  60 mEq Oral BID  . torsemide  20 mg Oral QPM  . torsemide  40 mg Oral Q breakfast  . Warfarin - Pharmacist Dosing Inpatient   Does not apply q1800   Continuous Infusions: . sodium chloride     PRN Meds:.acetaminophen, ondansetron (ZOFRAN) IV, sodium chloride    Filed Vitals:   03/13/15 0105 03/13/15 0205 03/13/15 0250 03/13/15 0354  BP: 124/76 127/74 137/76 135/79  Pulse: 57 57 65 61  Temp:    98.4 F (36.9 C)  TempSrc:    Oral  Resp: Height:    6' 2.5" (1.892 m)  Weight:    203 lb (92.08 kg)  SpO2: 99% 100% 99% 96%    Intake/Output Summary (Last 24 hours) at 03/13/15 0814 Last data filed at 03/13/15 0700  Gross per 24 hour  Intake    103 ml  Output   1175 ml  Net  -1072 ml    LABS: Basic Metabolic Panel:  Recent Labs  95/62/13 0329 03/13/15 0650  NA 146* 145  K 4.3 3.8  CL 109 110  CO2 27 24  GLUCOSE 89 120*  BUN 18 22  CREATININE 2.39* 2.28*  CALCIUM 8.9 8.9   Liver Function Tests: No results for input(s): AST, ALT, ALKPHOS, BILITOT, PROT, ALBUMIN in the last 72 hours. No results for input(s): LIPASE, AMYLASE  in the last 72 hours. CBC:  Recent Labs  03/10/15 2118  WBC 9.5  NEUTROABS 8.2*  HGB 10.7*  HCT 35.4*  MCV 87.4  PLT 256   Cardiac Enzymes: No results for input(s): CKTOTAL, CKMB, CKMBINDEX, TROPONINI in the last 72 hours. BNP: Invalid input(s): POCBNP D-Dimer: No results for input(s): DDIMER in the last 72 hours. Hemoglobin A1C: No results for input(s): HGBA1C in the last 72 hours. Fasting Lipid Panel: No results for input(s): CHOL, HDL, LDLCALC, TRIG, CHOLHDL, LDLDIRECT in the last 72 hours. Thyroid Function Tests: No results for input(s): TSH, T4TOTAL, T3FREE, THYROIDAB in the last 72 hours.  Invalid input(s): FREET3 Anemia Panel: No results for input(s): VITAMINB12, FOLATE, FERRITIN, TIBC, IRON, RETICCTPCT in the last 72 hours.  RADIOLOGY: Dg Chest 2 View  03/13/2015   CLINICAL DATA:  74 year old male status post pacemaker placement  EXAM: CHEST  2 VIEW  COMPARISON:  Preoperative chest x-ray 03/10/2015  FINDINGS: New left subclavian approach cardiac rhythm maintenance device. Leads project over the right atrium  and right ventricle. No evidence of pneumothorax or hemothorax. Stable cardiomegaly. Stable mediastinal contours. Atherosclerotic calcifications in the aorta. Pulmonary hyperexpansion and central bronchitic change. No pulmonary edema. No acute osseous abnormality.  IMPRESSION: 1. New left subclavian approach cardiac rhythm maintenance device with leads projecting over the right atrium and right ventricle. 2. No evidence of pneumothorax, hemothorax or other complication.   Electronically Signed   By: Malachy Moan M.D.   On: 03/13/2015 07:43   Dg Chest 2 View  03/10/2015   CLINICAL DATA:  Acute onset of shortness of breath. Initial encounter.  EXAM: CHEST  2 VIEW  COMPARISON:  Chest radiograph performed 11/27/2013  FINDINGS: The lungs are well-aerated. Vascular congestion is noted, with bilateral central airspace opacities and a small left pleural effusion, likely  reflecting mild recurrent pulmonary edema. No pneumothorax is seen.  The heart is enlarged.  No acute osseous abnormalities are seen.  IMPRESSION: Vascular congestion and cardiomegaly, with bilateral central airspace opacities and a small left pleural effusion, likely reflecting mild recurrent pulmonary edema.   Electronically Signed   By: Roanna Raider M.D.   On: 03/10/2015 22:54    PHYSICAL EXAM General: NAD Neck: JVP 7 cm, no thyromegaly or thyroid nodule.  Lungs: Clear to auscultation bilaterally with normal respiratory effort. CV: Nondisplaced PMI.  Heart regular S1/S2, no S3/S4, no murmur.  Trace ankle edema.  No carotid bruit.  Normal pedal pulses.  Abdomen: Soft, nontender, no hepatosplenomegaly, no distention.  Neurologic: Alert and oriented x 3.  Psych: Normal affect. Extremities: No clubbing or cyanosis.   TELEMETRY: Reviewed telemetry pt in NSR  ASSESSMENT AND PLAN: 1. Chronic systolic CHF: Nonischemic cardiomyopathy.  EF 30-35% with moderate MR and moderately decreased systolic function on 3/16. He has a chronic LBBB, very wide QRS.  He was admitted with increased dyspnea, probably mildly volume overloaded and now doing better.  - Will increase home torsemide to 40 qam/20 qpm.   - Now has [employer] ICD but unable to place LV lead due to lack of appropriate veins.  Only option with be epicardial LV lead, would have to have this placed by cardiac surgeon.  I think he should be evaluated for this but he will be relatively higher risk with significant CKD in addition to his CHF.  - Continue current Coreg and hydralazine/Imdur.  - No ACEI or spironolactone with CKD.  2. CAD: Patient had cardiac cath in 4/12 with mild nonobstructive CAD. He has had no chest pain. Given increased exertional dyspnea, he had Cardiolite in 1/16. This showed a primarily fixed large inferior and apical perfusion defect. This likely represents prior infarction with minimal peri-infarct ischemia (versus  artifact). We talked at length about coronary angiography today. Given lack of chest pain and minimal ischemia, I have elected to hold off on coronary angiography in the setting of CKD stage III-IV.  - He is on warfarin so not on ASA.  - Continue statin.  3. Atrial fibrillation: Paroxysmal. He is in NSR today. Continue warfarin, Coreg. 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 This is now under control.  4. CKD: Stage III-IV. Follows with Dr Lowell Guitar. Stable here.  5. Disposition: PT consult.  May need inpatient rehab.  If ok for home today, will discharge.  Will need followup in CHF clinic with me and f/u coumadin clinic.  Will likely need home PT.  Will need appointment to see Dr Laneta Simmers for evaluation for epicardial LV lead.  Cardiac  meds for home: Warfarin per coumadin clinic, torsemide 40 qam/20 qpm, amlodipine 10 daily, atorvastatin 20 daily, hydralazine 100 tid, Imdur 90 daily, Coreg 12.5 mg bid, KCl 60 bid, noncardiac meds as prior to admission.   Mohamed Portlock 03/13/2015 8:14 AM

## 2015-03-13 NOTE — Clinical Social Work Psychosocial (Signed)
     Clinical Social Work Department BRIEF PSYCHOSOCIAL ASSESSMENT 03/13/2015  Patient:  Frank Mclean, Frank Mclean     Account Number:  [id number]     Admit date:  03/10/2015  Clinical Social Worker:  Harless Nakayama  Date/Time:  03/13/2015 11:48 AM  Referred by:  Physician  Date Referred:  03/13/2015 Referred for  SNF Placement   Other Referral:   Interview type:  Patient Other interview type:   Spoke with pt and pt family at bedside    PSYCHOSOCIAL DATA Living Status:  WIFE Admitted from facility:   Level of care:   Primary support name:  Joana Reamer Primary support relationship to patient:  SPOUSE Degree of support available:   Pt has strong family support    CURRENT CONCERNS Current Concerns  Post-Acute Placement   Other Concerns:    SOCIAL WORK ASSESSMENT / PLAN CSW visited pt room and spoke with pt, pt wife, and pt brother-in-law at bedside. Pt spoke very little during conversation and allowed pt wife to speak for him during most of discussion. CSW explained Medicare eligibility guidelines for coverage of SNF. Pt wife very understanding. She explained that herself and her two brothers have arranged care for pt at home before and can do so again this time. Pt brother-in-law having a more difficult time understanding CSW role and continued to ask questions out of CSW scope. CSW did answer as best possible when appropriate. Pt wife informed CSW she is okay with pt being set up with home health services but she knows pt will require more than just a nurse this time. CSW explained that RNCM would be in to speak further if needed. Pt wife explained that while she is agreeable to pt discharging home she is not agreeable to this happening right now. She explained that she does not understanding why pt is having difficulty walking when he did not come into the hospital for any leg or weakness issues. CSW informed family that CSW cannot answer these questions and will ask pt nurse to  speak with family or have MD come to discuss questions. Pt wife thankful and once again expressed that she is agreeable to dc home with [facility] but not until they understand why pt is having current issues. Family did refuse private pay option at SNF. CSW spoke with [facility] and pt nurse and notified of conversation.Pt has no further hospital social work needs. CSW signing off.   Assessment/plan status:  Psychosocial Support/Ongoing Assessment of Needs Other assessment/ plan:   Information/referral to community resources:   None needed at this time    PATIENTS/FAMILYS RESPONSE TO PLAN OF CARE: Pt wife very pleasant and undertanding. She is agreeable to plan for home but informed CSW she will not agree to discharge until she is more understanding of pt condition.      Leonna Schlee, LCSWA  [phone]

## 2015-03-13 NOTE — ED Provider Notes (Signed)
CSN: [id number]     Arrival date & time 03/13/15  2253 History   This chart was scribed for Tomasita Crumble, MD by [name] Slack, ED Scribe. This patient was seen in room A11C/A11C and the patient's care was started at 11:10 PM.      Chief Complaint  Patient presents with  . Weakness   Patient is a 74 y.o. male presenting with weakness. The history is provided by the patient. No language interpreter was used.  Weakness This is a new problem. The current episode started more than 2 days ago. Associated symptoms include shortness of breath.   HPI Comments: Frank Mclean is a 74 y.o. male who presents to the Emergency Department complaining of intermittent weakness for the past 3 days. Pt was recently discharged on 03/12/15 after having pacemake placed. Pt states that he is not able to ambulate on his own. Pt states the before being in the hospital he was able to ambulate on his own. Pt doesn't report any alleviating factors.     Pt states that tonight he he felt SOB PTA. PT states that he wears oxygen at home nightly. Pt denies any pain at this time. Pt reports vomiting x2 today at 6 PM. Wife states that Pt has recently fell and has been more confused. Pt denies cough or other related symptoms.  Past Medical History  Diagnosis Date  . Gout   . HTN (hypertension)   . Hyperlipidemia   . Osteoarthritis   . History of CVA (cerebrovascular accident)     Left pontine infarct July 2004; changed from Plavix to Coumadin in 2004 per MD at [facility]  . GERD (gastroesophageal reflux disease)   . DJD (degenerative joint disease)   . BPH (benign prostatic hypertrophy)   . Peripheral vascular disease   . Bilateral leg ulcer     ACHILLES AREA--  NONHEALING  . CKD (chronic kidney disease) stage 3, GFR 30-59 ml/min   . CAD (coronary artery disease)     a. LHC 4/12: Mid LAD 25%, mid diagonal 30%, AV circumflex 40%, proximal OM 25%, distal RCA 40%  . Chronic combined systolic and diastolic heart failure      a. Echo 05/2013: EF 25-30%, diffuse HK, restrictive physiology, trivial AI, mild MR, moderate LAE, reduced RV systolic function, PASP 42  . LBBB (left bundle branch block)   . PAF (paroxysmal atrial fibrillation)     a. amiodarone Rx started 05/2013;  b. chronic coumadin  . NICM (nonischemic cardiomyopathy)     a. EF 25-30%.  [name] Hx of cardiovascular stress test     Nuclear Stress Test (1/16): High risk stress nuclear study with a large, severe, partially reversible inferior and apical defect consistent with prior inferior and apical infarct; mild apical ischemia; severe LVE; study high risk due to reduced LV function.  EF 25% >> reviewed with Dr. Antoine Poche >> medical mgmt  . Hypothyroidism   . DM (diabetes mellitus), type 2    Past Surgical History  Procedure Laterality Date  . Cardiac catheterization  04-16-2011   DR [facility]    NON-OBSTRUCTIVE CAD. MILDLY ELEVATED PULMONARY PRESSURES/ ELEVATED  END-DIASTOLIC PRESSURE  . Transthoracic echocardiogram  12-31-2010    MODERATE CONCENTRIC LVH/ SYSTOLIC FUNCTION SEVERELY REDUCED/ EF 25-30%/  SEVERE HYPOKINESIS OF ANTEROSEPTAL MYOCARDIUM  AND ENTIREAPICAL MYOCARDIUM /  MODERATE HYPOKINESIS OF LATERAL, INFEROLATERAL, INFERIOR,AND INFEROSEPTAL MYOCARIUM/  GRADE 3 DIASTOLIC DYSFUNCTION/ MILD MR  . Aortogram w/ bilateral lower extremitiy runoff  05-18-2013  DR FIELDS  LEFT LEG OCCLUDED PERONEAL AND ANTERIOR TIBIAL ARTERIES/ HIGH GRADE STENOIS 80% MIDDLE AND DISTAL THIRD OF POSTERIOR TIBIAL ARTERY/ RIGHT PERONEAL AND ANTERIOR TIBIAL ARTERY OCCLUDED/ 40% STENOSIS DISTALLY   . Abdominal aortagram N/A 05/18/2013    Procedure: ABDOMINAL Ronny Flurry;  Surgeon: Sherren Kerns, MD;  Location: [facility] CATH LAB;  Service: Cardiovascular;  Laterality: N/A;  . Lower extremity angiogram Bilateral 05/18/2013    Procedure: LOWER EXTREMITY ANGIOGRAM;  Surgeon: Sherren Kerns, MD;  Location: [facility] CATH LAB;  Service: Cardiovascular;  Laterality: Bilateral;  . Right heart  catheterization N/A 02/13/2015    Procedure: RIGHT HEART CATH;  Surgeon: Laurey Morale, MD;  Location: [facility] CATH LAB;  Service: Cardiovascular;  Laterality: N/A;  . Bi-ventricular implantable cardioverter defibrillator N/A 03/12/2015    Procedure: BI-VENTRICULAR IMPLANTABLE CARDIOVERTER DEFIBRILLATOR  (CRT-D);  Surgeon: Marinus Maw, MD;  Location: [facility] CATH LAB;  Service: Cardiovascular;  Laterality: N/A;   Family History  Problem Relation Age of Onset  . Hypertension Mother   . Heart disease Mother   . Diabetes Mother   . Gout Other   . Stroke Other   . Hypertension Father   . Heart disease Father   . Heart attack Maternal Grandmother   . Thyroid disease Neg Hx    History  Substance Use Topics  . Smoking status: Former Smoker    Quit date: 04/12/1974  . Smokeless tobacco: Never Used  . Alcohol Use: No     Comment: previously drank - "love cognac" quit 15 yrs ago.    Review of Systems  Respiratory: Positive for shortness of breath. Negative for cough.   Gastrointestinal: Positive for vomiting.  Neurological: Positive for weakness.  All other systems reviewed and are negative.    Allergies  Levothyroxine sodium; Chicken protein; and Eggs or egg-derived products  Home Medications   Prior to Admission medications   Medication Sig Start Date End Date Taking? Authorizing Provider  amLODipine (NORVASC) 10 MG tablet Take 10 mg by mouth daily.    Historical Provider, MD  atorvastatin (LIPITOR) 20 MG tablet Take 1 tablet (20 mg total) by mouth daily. 01/16/15   Beatrice Lecher, PA-C  carvedilol (COREG) 12.5 MG tablet Take 1 tablet (12.5 mg total) by mouth 2 (two) times daily with a meal. 03/13/15   Janetta Hora, PA-C  cholecalciferol (VITAMIN D) 1000 UNITS tablet Take 1,000 Units by mouth daily.    Historical Provider, MD  [facility] VITAMIN B12 1000 MCG tablet TAKE 1 TABLET BY MOUTH DAILY 01/27/15   Aleksei Plotnikov V, MD  escitalopram (LEXAPRO) 5 MG tablet Take 1 tablet (5 mg total)  by mouth daily. Patient taking differently: Take 5 mg by mouth at bedtime.  02/03/15   Aleksei Plotnikov V, MD  ferrous sulfate 325 (65 FE) MG tablet TAKE 1 TABLET BY MOUTH EVERY DAY 11/19/14   Aleksei Plotnikov V, MD  glipiZIDE (GLUCOTROL) 5 MG tablet TAKE 1/2 TABLET BY MOUTH TWICE A DAY BEFORE A MEAL 01/27/15   Aleksei Plotnikov V, MD  hydrALAZINE (APRESOLINE) 100 MG tablet Take 1 tablet (100 mg total) by mouth 3 (three) times daily. 03/05/15   Marinus Maw, MD  isosorbide mononitrate (IMDUR) 60 MG 24 hr tablet Take 1.5 tablets for 90 mg by mouth twice a day. Patient taking differently: Take 90 mg by mouth 2 (two) times daily. Take 1.5 tablets for 90 mg by mouth twice a day. 03/05/15   Marinus Maw, MD  metolazone (ZAROXOLYN) 5 MG tablet  Take 5 mg by mouth as needed (fluid).  08/12/14   Rollene Rotunda, MD  OXYGEN Inhale 2.5 L into the lungs See admin instructions. USES OXYGEN EVERY BEDTIME USES DURING DAY ONLY AS NEEDED    Historical Provider, MD  potassium chloride SA (KLOR-CON M20) 20 MEQ tablet Take 3 tabs TWICE DAILY Patient taking differently: 60 mEq 2 (two) times daily. Take 3 tabs TWICE DAILY 01/27/15   Rollene Rotunda, MD  SYMBICORT 160-4.5 MCG/ACT inhaler INHALE 2 PUFFS INTO THE LUNGS 2 TIMES DAILY 11/28/14   Aleksei Plotnikov V, MD  SYNTHROID 100 MCG tablet TAKE 2 TABLETS BY MOUTH EVERY MORNING BEFORE BREAKFAST 01/27/15   Aleksei Plotnikov V, MD  torsemide (DEMADEX) 20 MG tablet Take 1-2 tablets (20-40 mg total) by mouth 2 (two) times daily. Please take 20mg  in the AM and 40 in the PM 03/13/15   Janetta Hora, PA-C  warfarin (COUMADIN) 5 MG tablet TAKE 1 TABLET (5 MG TOTAL) BY MOUTH AS DIRECTED. 01/17/15   Aleksei Plotnikov V, MD   BP 143/71 mmHg  Pulse 68  Temp(Src) 99.5 F (37.5 C) (Oral)  Resp 18  SpO2 100%   Physical Exam  Constitutional: He is oriented to person, place, and time. Vital signs are normal. He appears well-developed and well-nourished.  Non-toxic appearance. He does  not appear ill. No distress. Nasal cannula in place.  HENT:  Head: Normocephalic and atraumatic.  Nose: Nose normal.  Mouth/Throat: Oropharynx is clear and moist. No oropharyngeal exudate.  Nasal canula in place.   Eyes: Conjunctivae and EOM are normal. Pupils are equal, round, and reactive to light. No scleral icterus.  Neck: Normal range of motion. Neck supple. No tracheal deviation, no edema, no erythema and normal range of motion present. No thyroid mass and no thyromegaly present.  Cardiovascular: Normal rate, regular rhythm, S1 normal, S2 normal, normal heart sounds, intact distal pulses and normal pulses.  Exam reveals no gallop and no friction rub.   No murmur heard. Pulses:      Radial pulses are 2+ on the right side, and 2+ on the left side.       Dorsalis pedis pulses are 2+ on the right side, and 2+ on the left side.  Pulmonary/Chest: Effort normal and breath sounds normal. No respiratory distress. He has no wheezes. He has no rhonchi. He has no rales.  New pace maker placed area is warm but no purulence with stirrups in place.   Abdominal: Soft. Normal appearance and bowel sounds are normal. He exhibits no distension, no ascites and no mass. There is no hepatosplenomegaly. There is no tenderness. There is no rebound, no guarding and no CVA tenderness.  Musculoskeletal: Normal range of motion. He exhibits no edema or tenderness.  Lymphadenopathy:    He has no cervical adenopathy.  Neurological: He is alert and oriented to person, place, and time. He has normal strength. No cranial nerve deficit or sensory deficit.  Skin: Skin is warm, dry and intact. No petechiae and no rash noted. He is not diaphoretic. No erythema. No pallor.  Psychiatric: He has a normal mood and affect. His behavior is normal. Judgment normal.  Nursing note and vitals reviewed.   ED Course  Procedures (including critical care time) DIAGNOSTIC STUDIES: Oxygen Saturation is 100% on RA, normal by my  interpretation.    COORDINATION OF CARE: 11:35 PM-Discussed treatment plan with pt at bedside and pt agreed to plan.     Labs Review Labs Reviewed  CBC WITH  DIFFERENTIAL/PLATELET - Abnormal; Notable for the following:    WBC 14.9 (*)    RBC 3.79 (*)    Hemoglobin 10.0 (*)    HCT 32.6 (*)    RDW 15.9 (*)    Neutrophils Relative % 89 (*)    Neutro Abs 13.1 (*)    Lymphocytes Relative 4 (*)    Lymphs Abs 0.6 (*)    Monocytes Absolute 1.1 (*)    All other components within normal limits  COMPREHENSIVE METABOLIC PANEL - Abnormal; Notable for the following:    Glucose, Bld 106 (*)    BUN 27 (*)    Creatinine, Ser 2.75 (*)    Total Protein 5.8 (*)    Albumin 3.1 (*)    GFR calc non Af Amer 21 (*)    GFR calc Af Amer 24 (*)    All other components within normal limits  PROTIME-INR - Abnormal; Notable for the following:    Prothrombin Time 30.4 (*)    INR 2.88 (*)    All other components within normal limits  URINE RAPID DRUG SCREEN (HOSP PERFORMED) - Abnormal; Notable for the following:    Benzodiazepines POSITIVE (*)    All other components within normal limits  ACETAMINOPHEN LEVEL - Abnormal; Notable for the following:    Acetaminophen (Tylenol), Serum <10.0 (*)    All other components within normal limits  TROPONIN I - Abnormal; Notable for the following:    Troponin I 0.22 (*)    All other components within normal limits  PROTIME-INR - Abnormal; Notable for the following:    Prothrombin Time 28.0 (*)    INR 2.60 (*)    All other components within normal limits  TROPONIN I - Abnormal; Notable for the following:    Troponin I 0.25 (*)    All other components within normal limits  BRAIN NATRIURETIC PEPTIDE - Abnormal; Notable for the following:    B Natriuretic Peptide 315.7 (*)    All other components within normal limits  APTT - Abnormal; Notable for the following:    aPTT 52 (*)    All other components within normal limits  CBG MONITORING, ED - Abnormal; Notable  for the following:    Glucose-Capillary 103 (*)    All other components within normal limits  CULTURE, BLOOD (ROUTINE X 2)  CULTURE, BLOOD (ROUTINE X 2)  RESPIRATORY VIRUS PANEL  CULTURE, EXPECTORATED SPUTUM-ASSESSMENT  GRAM STAIN  LIPASE, BLOOD  URINALYSIS, ROUTINE W REFLEX MICROSCOPIC  ETHANOL  SALICYLATE LEVEL  CREATININE, URINE, RANDOM  INFLUENZA PANEL BY PCR (TYPE A & B, H1N1)  LACTIC ACID, PLASMA  PROCALCITONIN  HIV ANTIBODY (ROUTINE TESTING)  STREP PNEUMONIAE URINARY ANTIGEN  GLUCOSE, CAPILLARY  GLUCOSE, CAPILLARY  TROPONIN I  TROPONIN I  UREA NITROGEN, URINE  LEGIONELLA ANTIGEN, URINE  I-STAT TROPOININ, ED  I-STAT CG4 LACTIC ACID, ED    Imaging Review Dg Chest 2 View  03/14/2015   CLINICAL DATA:  Acute onset of generalized weakness and shortness of breath. Initial encounter.  EXAM: CHEST  2 VIEW  COMPARISON:  Chest radiograph performed 03/13/2015  FINDINGS: The lungs are well-aerated. Mild vascular congestion is noted. Mild left basilar opacity may reflect atelectasis or possibly minimal interstitial edema. There is no evidence of pleural effusion or pneumothorax.  The heart is enlarged. A pacemaker/AICD is noted at the left chest wall, with leads ending at the right atrium and right ventricle. No acute osseous abnormalities are seen.  IMPRESSION: Mild vascular congestion and cardiomegaly noted.  Mild left basilar airspace opacity may reflect atelectasis or possibly minimal interstitial edema.   Electronically Signed   By: Roanna Raider M.D.   On: 03/14/2015 00:28   Dg Chest 2 View  03/13/2015   CLINICAL DATA:  74 year old male status post pacemaker placement  EXAM: CHEST  2 VIEW  COMPARISON:  Preoperative chest x-ray 03/10/2015  FINDINGS: New left subclavian approach cardiac rhythm maintenance device. Leads project over the right atrium and right ventricle. No evidence of pneumothorax or hemothorax. Stable cardiomegaly. Stable mediastinal contours. Atherosclerotic  calcifications in the aorta. Pulmonary hyperexpansion and central bronchitic change. No pulmonary edema. No acute osseous abnormality.  IMPRESSION: 1. New left subclavian approach cardiac rhythm maintenance device with leads projecting over the right atrium and right ventricle. 2. No evidence of pneumothorax, hemothorax or other complication.   Electronically Signed   By: Malachy Moan M.D.   On: 03/13/2015 07:43   Ct Head Wo Contrast  03/14/2015   CLINICAL DATA:  Acute onset of generalized weakness. Initial encounter.  EXAM: CT HEAD WITHOUT CONTRAST  TECHNIQUE: Contiguous axial images were obtained from the base of the skull through the vertex without intravenous contrast.  COMPARISON:  CT of the head performed 09/13/2013  FINDINGS: There is no evidence of acute infarction, mass lesion, or intra- or extra-axial hemorrhage on CT.  Prominence of the ventricles and sulci reflects mild to moderate cortical volume loss. Cerebellar atrophy is noted. Scattered periventricular and subcortical white matter change likely reflects small vessel ischemic microangiopathy. A prominent chronic lacunar infarct is noted at the right thalamus, and a chronic lacunar infarct is seen at the left basal ganglia.  The brainstem and fourth ventricle are within normal limits. The cerebral hemispheres demonstrate grossly normal gray-white differentiation. No mass effect or midline shift is seen.  There is no evidence of fracture; visualized osseous structures are unremarkable in appearance. The orbits are within normal limits. The paranasal sinuses and mastoid air cells are well-aerated. No significant soft tissue abnormalities are seen.  IMPRESSION: 1. No acute intracranial pathology seen on CT. 2. Mild to moderate cortical volume loss and scattered small vessel ischemic microangiopathy. 3. 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   Electronically Signed   By: Roanna Raider M.D.   On: 03/14/2015 00:48    Ct Chest Wo Contrast  03/14/2015   CLINICAL DATA:  Acute onset of shortness of breath and generalized weakness. Initial encounter.  EXAM: CT CHEST WITHOUT CONTRAST  TECHNIQUE: Multidetector CT imaging of the chest was performed following the standard protocol without IV contrast.  COMPARISON:  Chest radiograph performed earlier today at 12:03 a.m.  FINDINGS: Patchy bibasilar airspace opacities are noted at the lung bases, in a peribronchovascular distribution, and at the left lingula. This is concerning for pneumonia. The lungs are otherwise clear. No pleural effusion or pneumothorax is seen. No masses are identified.  Scattered coronary artery calcifications are seen. The heart is mildly enlarged. A pacemaker is noted at the left chest wall, with leads ending at the right atrium and right ventricle. No pericardial effusion is identified. No mediastinal lymphadenopathy is seen. Scattered calcification is noted along the aortic arch. The great vessels are grossly unremarkable in appearance. The thyroid gland is within normal limits. No axillary lymphadenopathy is seen.  The visualized portions of the liver and spleen are unremarkable, aside from a calcified granuloma at the right hepatic lobe. Mildly increased attenuation within the gallbladder is nonspecific; no definite stones are seen. The  visualized portions of the pancreas and adrenal glands are within normal limits. A 4.6 cm left renal cyst is noted. Mild calcification is noted along the proximal abdominal aorta  No acute osseous abnormalities are seen. Mild degenerative change is noted at the lower cervical spine.  IMPRESSION: 1. Patchy bibasilar airspace opacities at the lung bases, in a peribronchovascular distribution, and at the left lingula. This is concerning for multifocal pneumonia. 2. Scattered coronary artery calcifications seen. Mild cardiomegaly noted. 3. Left renal cyst noted. 4. Mild calcification along the proximal abdominal aorta.    Electronically Signed   By: Roanna Raider M.D.   On: 03/14/2015 02:32     EKG Interpretation   Date/Time:  Thursday March 13 2015 23:03:25 EDT Ventricular Rate:  67 PR Interval:  184 QRS Duration: 174 QT Interval:  590 QTC Calculation: 623 R Axis:   -75 Text Interpretation:  Sinus rhythm Left atrial enlargement Nonspecific  IVCD with LAD Left ventricular hypertrophy No significant change since  last tracing Confirmed by Erroll Luna [id number]) on 03/13/2015  11:15:12 PM      MDM   Final diagnoses:  None    Patient presents for weakness, AMS, and fever.  In the setting of recent hospital admission and surgery.  CT confirms multifocal pneumonia.  Blood cultures drawn and he was given Zenaida Niece, cefepime for treatment.  He was admitted to triad, telemetry for management.     I personally performed the services described in this documentation, which was scribed in my presence. The recorded information has been reviewed and is accurate.      Tomasita Crumble, MD 03/14/15 [phone]

## 2015-03-13 NOTE — ED Notes (Signed)
Pt c/o weakness since yesterday. Recent pacemaker/ defibrillator placed yesterday. Pt c/o weakness x 3 days, worsening tonight. Reports prior to pacemaker being placed, pt able to ambulate at home; unable to ambulate since placement. Also c/o increased shortness of breath; wears oxygen at home at night. Pt a&o x4, denies pain at this time

## 2015-03-13 NOTE — Telephone Encounter (Signed)
  Patient was discharged home today, s/p PPM by Dr. Ladona Ridgel. He is on warfarin. I spoke with his wife, who called after clinic hours stating that patient fell at home trying to get in bed. The fall was unwitnessed but patient denies hitting head against any objects. However, his wife states that he is disoriented and has vomited x 2. Given chronic coumadin therapy, fall with ? Head trauma, disorientation (new from baseline) and vomiting, I recommended that he present to ER for evaluation. The patient and his wife are both in agreement.  SIMMONS, Avon Gully 03/13/2015

## 2015-03-13 NOTE — ED Notes (Signed)
Pt to ED from home after being discharged on 3/23 for pacemaker/defib placement. Family called EMS due to pt being weak. Pt denies complaints at this time

## 2015-03-14 ENCOUNTER — Encounter (HOSPITAL_COMMUNITY): Payer: Self-pay

## 2015-03-14 ENCOUNTER — Emergency Department (HOSPITAL_COMMUNITY): Payer: Medicare Other

## 2015-03-14 DIAGNOSIS — I5042 Chronic combined systolic (congestive) and diastolic (congestive) heart failure: Secondary | ICD-10-CM | POA: Diagnosis present

## 2015-03-14 DIAGNOSIS — E1122 Type 2 diabetes mellitus with diabetic chronic kidney disease: Secondary | ICD-10-CM

## 2015-03-14 DIAGNOSIS — J189 Pneumonia, unspecified organism: Secondary | ICD-10-CM | POA: Diagnosis not present

## 2015-03-14 DIAGNOSIS — R4182 Altered mental status, unspecified: Secondary | ICD-10-CM | POA: Diagnosis present

## 2015-03-14 DIAGNOSIS — J449 Chronic obstructive pulmonary disease, unspecified: Secondary | ICD-10-CM | POA: Diagnosis not present

## 2015-03-14 DIAGNOSIS — K219 Gastro-esophageal reflux disease without esophagitis: Secondary | ICD-10-CM | POA: Diagnosis present

## 2015-03-14 DIAGNOSIS — J811 Chronic pulmonary edema: Secondary | ICD-10-CM | POA: Diagnosis not present

## 2015-03-14 DIAGNOSIS — E1142 Type 2 diabetes mellitus with diabetic polyneuropathy: Secondary | ICD-10-CM | POA: Diagnosis not present

## 2015-03-14 DIAGNOSIS — K210 Gastro-esophageal reflux disease with esophagitis: Secondary | ICD-10-CM

## 2015-03-14 DIAGNOSIS — R531 Weakness: Secondary | ICD-10-CM | POA: Insufficient documentation

## 2015-03-14 DIAGNOSIS — R7989 Other specified abnormal findings of blood chemistry: Secondary | ICD-10-CM

## 2015-03-14 DIAGNOSIS — M109 Gout, unspecified: Secondary | ICD-10-CM | POA: Diagnosis present

## 2015-03-14 DIAGNOSIS — I480 Paroxysmal atrial fibrillation: Secondary | ICD-10-CM | POA: Diagnosis not present

## 2015-03-14 DIAGNOSIS — N185 Chronic kidney disease, stage 5: Secondary | ICD-10-CM | POA: Diagnosis present

## 2015-03-14 DIAGNOSIS — Z8673 Personal history of transient ischemic attack (TIA), and cerebral infarction without residual deficits: Secondary | ICD-10-CM | POA: Diagnosis not present

## 2015-03-14 DIAGNOSIS — I5022 Chronic systolic (congestive) heart failure: Secondary | ICD-10-CM | POA: Diagnosis not present

## 2015-03-14 DIAGNOSIS — I447 Left bundle-branch block, unspecified: Secondary | ICD-10-CM

## 2015-03-14 DIAGNOSIS — I872 Venous insufficiency (chronic) (peripheral): Secondary | ICD-10-CM | POA: Diagnosis present

## 2015-03-14 DIAGNOSIS — E870 Hyperosmolality and hypernatremia: Secondary | ICD-10-CM | POA: Diagnosis not present

## 2015-03-14 DIAGNOSIS — R918 Other nonspecific abnormal finding of lung field: Secondary | ICD-10-CM | POA: Diagnosis not present

## 2015-03-14 DIAGNOSIS — Z95810 Presence of automatic (implantable) cardiac defibrillator: Secondary | ICD-10-CM | POA: Diagnosis not present

## 2015-03-14 DIAGNOSIS — F329 Major depressive disorder, single episode, unspecified: Secondary | ICD-10-CM | POA: Diagnosis present

## 2015-03-14 DIAGNOSIS — R5381 Other malaise: Secondary | ICD-10-CM

## 2015-03-14 DIAGNOSIS — Z6827 Body mass index (BMI) 27.0-27.9, adult: Secondary | ICD-10-CM | POA: Diagnosis not present

## 2015-03-14 DIAGNOSIS — I5023 Acute on chronic systolic (congestive) heart failure: Secondary | ICD-10-CM | POA: Diagnosis not present

## 2015-03-14 DIAGNOSIS — Z881 Allergy status to other antibiotic agents status: Secondary | ICD-10-CM | POA: Diagnosis not present

## 2015-03-14 DIAGNOSIS — N183 Chronic kidney disease, stage 3 (moderate): Secondary | ICD-10-CM | POA: Diagnosis not present

## 2015-03-14 DIAGNOSIS — I517 Cardiomegaly: Secondary | ICD-10-CM | POA: Diagnosis not present

## 2015-03-14 DIAGNOSIS — Z7901 Long term (current) use of anticoagulants: Secondary | ICD-10-CM | POA: Diagnosis not present

## 2015-03-14 DIAGNOSIS — E538 Deficiency of other specified B group vitamins: Secondary | ICD-10-CM | POA: Diagnosis present

## 2015-03-14 DIAGNOSIS — I120 Hypertensive chronic kidney disease with stage 5 chronic kidney disease or end stage renal disease: Secondary | ICD-10-CM | POA: Diagnosis present

## 2015-03-14 DIAGNOSIS — E440 Moderate protein-calorie malnutrition: Secondary | ICD-10-CM | POA: Diagnosis not present

## 2015-03-14 DIAGNOSIS — R0602 Shortness of breath: Secondary | ICD-10-CM | POA: Diagnosis not present

## 2015-03-14 DIAGNOSIS — A419 Sepsis, unspecified organism: Principal | ICD-10-CM

## 2015-03-14 DIAGNOSIS — Z87891 Personal history of nicotine dependence: Secondary | ICD-10-CM | POA: Diagnosis not present

## 2015-03-14 DIAGNOSIS — E1129 Type 2 diabetes mellitus with other diabetic kidney complication: Secondary | ICD-10-CM | POA: Diagnosis not present

## 2015-03-14 DIAGNOSIS — Z5181 Encounter for therapeutic drug level monitoring: Secondary | ICD-10-CM

## 2015-03-14 DIAGNOSIS — I429 Cardiomyopathy, unspecified: Secondary | ICD-10-CM | POA: Diagnosis present

## 2015-03-14 DIAGNOSIS — G9340 Encephalopathy, unspecified: Secondary | ICD-10-CM | POA: Insufficient documentation

## 2015-03-14 DIAGNOSIS — E039 Hypothyroidism, unspecified: Secondary | ICD-10-CM | POA: Diagnosis present

## 2015-03-14 DIAGNOSIS — R778 Other specified abnormalities of plasma proteins: Secondary | ICD-10-CM | POA: Diagnosis present

## 2015-03-14 DIAGNOSIS — E46 Unspecified protein-calorie malnutrition: Secondary | ICD-10-CM | POA: Insufficient documentation

## 2015-03-14 DIAGNOSIS — N189 Chronic kidney disease, unspecified: Secondary | ICD-10-CM

## 2015-03-14 DIAGNOSIS — Z91012 Allergy to eggs: Secondary | ICD-10-CM | POA: Diagnosis not present

## 2015-03-14 DIAGNOSIS — I2510 Atherosclerotic heart disease of native coronary artery without angina pectoris: Secondary | ICD-10-CM | POA: Diagnosis present

## 2015-03-14 DIAGNOSIS — I739 Peripheral vascular disease, unspecified: Secondary | ICD-10-CM | POA: Diagnosis present

## 2015-03-14 DIAGNOSIS — M1990 Unspecified osteoarthritis, unspecified site: Secondary | ICD-10-CM | POA: Diagnosis present

## 2015-03-14 DIAGNOSIS — I5043 Acute on chronic combined systolic (congestive) and diastolic (congestive) heart failure: Secondary | ICD-10-CM | POA: Insufficient documentation

## 2015-03-14 DIAGNOSIS — R269 Unspecified abnormalities of gait and mobility: Secondary | ICD-10-CM | POA: Diagnosis not present

## 2015-03-14 DIAGNOSIS — D649 Anemia, unspecified: Secondary | ICD-10-CM | POA: Diagnosis present

## 2015-03-14 DIAGNOSIS — E785 Hyperlipidemia, unspecified: Secondary | ICD-10-CM | POA: Diagnosis present

## 2015-03-14 LAB — CBC WITH DIFFERENTIAL/PLATELET
Basophils Absolute: 0 10*3/uL (ref 0.0–0.1)
Basophils Relative: 0 % (ref 0–1)
EOS ABS: 0 10*3/uL (ref 0.0–0.7)
Eosinophils Relative: 0 % (ref 0–5)
HCT: 32.6 % — ABNORMAL LOW (ref 39.0–52.0)
Hemoglobin: 10 g/dL — ABNORMAL LOW (ref 13.0–17.0)
LYMPHS ABS: 0.6 10*3/uL — AB (ref 0.7–4.0)
LYMPHS PCT: 4 % — AB (ref 12–46)
MCH: 26.4 pg (ref 26.0–34.0)
MCHC: 30.7 g/dL (ref 30.0–36.0)
MCV: 86 fL (ref 78.0–100.0)
Monocytes Absolute: 1.1 10*3/uL — ABNORMAL HIGH (ref 0.1–1.0)
Monocytes Relative: 7 % (ref 3–12)
NEUTROS PCT: 89 % — AB (ref 43–77)
Neutro Abs: 13.1 10*3/uL — ABNORMAL HIGH (ref 1.7–7.7)
PLATELETS: 227 10*3/uL (ref 150–400)
RBC: 3.79 MIL/uL — AB (ref 4.22–5.81)
RDW: 15.9 % — ABNORMAL HIGH (ref 11.5–15.5)
WBC: 14.9 10*3/uL — AB (ref 4.0–10.5)

## 2015-03-14 LAB — URINALYSIS, ROUTINE W REFLEX MICROSCOPIC
BILIRUBIN URINE: NEGATIVE
GLUCOSE, UA: NEGATIVE mg/dL
Hgb urine dipstick: NEGATIVE
Ketones, ur: NEGATIVE mg/dL
Leukocytes, UA: NEGATIVE
Nitrite: NEGATIVE
PROTEIN: NEGATIVE mg/dL
Specific Gravity, Urine: 1.017 (ref 1.005–1.030)
UROBILINOGEN UA: 0.2 mg/dL (ref 0.0–1.0)
pH: 5 (ref 5.0–8.0)

## 2015-03-14 LAB — PROTIME-INR
INR: 2.6 — AB (ref 0.00–1.49)
INR: 2.88 — ABNORMAL HIGH (ref 0.00–1.49)
PROTHROMBIN TIME: 28 s — AB (ref 11.6–15.2)
Prothrombin Time: 30.4 seconds — ABNORMAL HIGH (ref 11.6–15.2)

## 2015-03-14 LAB — COMPREHENSIVE METABOLIC PANEL
ALT: 7 U/L (ref 0–53)
ANION GAP: 7 (ref 5–15)
AST: 17 U/L (ref 0–37)
Albumin: 3.1 g/dL — ABNORMAL LOW (ref 3.5–5.2)
Alkaline Phosphatase: 70 U/L (ref 39–117)
BUN: 27 mg/dL — AB (ref 6–23)
CALCIUM: 8.8 mg/dL (ref 8.4–10.5)
CO2: 28 mmol/L (ref 19–32)
CREATININE: 2.75 mg/dL — AB (ref 0.50–1.35)
Chloride: 110 mmol/L (ref 96–112)
GFR, EST AFRICAN AMERICAN: 24 mL/min — AB (ref >90)
GFR, EST NON AFRICAN AMERICAN: 21 mL/min — AB (ref >90)
GLUCOSE: 106 mg/dL — AB (ref 70–99)
POTASSIUM: 3.9 mmol/L (ref 3.5–5.1)
Sodium: 145 mmol/L (ref 135–145)
Total Bilirubin: 0.8 mg/dL (ref 0.3–1.2)
Total Protein: 5.8 g/dL — ABNORMAL LOW (ref 6.0–8.3)

## 2015-03-14 LAB — CBG MONITORING, ED: Glucose-Capillary: 103 mg/dL — ABNORMAL HIGH (ref 70–99)

## 2015-03-14 LAB — GLUCOSE, CAPILLARY
GLUCOSE-CAPILLARY: 98 mg/dL (ref 70–99)
GLUCOSE-CAPILLARY: 158 mg/dL — AB (ref 70–99)
Glucose-Capillary: 93 mg/dL (ref 70–99)
Glucose-Capillary: 154 mg/dL — ABNORMAL HIGH (ref 70–99)

## 2015-03-14 LAB — CREATININE, URINE, RANDOM: CREATININE, URINE: 144.36 mg/dL

## 2015-03-14 LAB — TROPONIN I
TROPONIN I: 0.3 ng/mL — AB (ref <0.031)
Troponin I: 0.22 ng/mL — ABNORMAL HIGH (ref <0.031)
Troponin I: 0.25 ng/mL — ABNORMAL HIGH (ref <0.031)
Troponin I: 0.33 ng/mL — ABNORMAL HIGH (ref <0.031)

## 2015-03-14 LAB — SALICYLATE LEVEL

## 2015-03-14 LAB — LACTIC ACID, PLASMA: LACTIC ACID, VENOUS: 1.2 mmol/L (ref 0.5–2.0)

## 2015-03-14 LAB — STREP PNEUMONIAE URINARY ANTIGEN: Strep Pneumo Urinary Antigen: NEGATIVE

## 2015-03-14 LAB — RAPID URINE DRUG SCREEN, HOSP PERFORMED
AMPHETAMINES: NOT DETECTED
BENZODIAZEPINES: POSITIVE — AB
Barbiturates: NOT DETECTED
Cocaine: NOT DETECTED
OPIATES: NOT DETECTED
Tetrahydrocannabinol: NOT DETECTED

## 2015-03-14 LAB — PROCALCITONIN: Procalcitonin: 0.38 ng/mL

## 2015-03-14 LAB — INFLUENZA PANEL BY PCR (TYPE A & B)
H1N1 flu by pcr: NOT DETECTED
INFLAPCR: NEGATIVE
Influenza B By PCR: NEGATIVE

## 2015-03-14 LAB — APTT: APTT: 52 s — AB (ref 24–37)

## 2015-03-14 LAB — I-STAT CG4 LACTIC ACID, ED: LACTIC ACID, VENOUS: 1.89 mmol/L (ref 0.5–2.0)

## 2015-03-14 LAB — BRAIN NATRIURETIC PEPTIDE: B Natriuretic Peptide: 315.7 pg/mL — ABNORMAL HIGH (ref 0.0–100.0)

## 2015-03-14 LAB — ETHANOL: Alcohol, Ethyl (B): <5 mg/dL (ref 0–9)

## 2015-03-14 LAB — ACETAMINOPHEN LEVEL

## 2015-03-14 LAB — HIV ANTIBODY (ROUTINE TESTING W REFLEX): HIV SCREEN 4TH GENERATION: NONREACTIVE

## 2015-03-14 LAB — LIPASE, BLOOD: Lipase: 14 U/L (ref 11–59)

## 2015-03-14 MED ORDER — HYDROCODONE-ACETAMINOPHEN 5-325 MG PO TABS
1.0000 | ORAL_TABLET | Freq: Four times a day (QID) | ORAL | Status: DC | PRN
  Administered 2015-03-14 – 2015-03-16 (×4): 1 via ORAL
  Filled 2015-03-14 (×4): qty 1

## 2015-03-14 MED ORDER — SODIUM CHLORIDE 0.9 % IV SOLN
INTRAVENOUS | Status: DC
  Administered 2015-03-14: 06:00:00 via INTRAVENOUS

## 2015-03-14 MED ORDER — VITAMIN D3 25 MCG (1000 UNIT) PO TABS
1000.0000 [IU] | ORAL_TABLET | Freq: Every day | ORAL | Status: DC
  Administered 2015-03-14 – 2015-03-17 (×4): 1000 [IU] via ORAL
  Filled 2015-03-14 (×4): qty 1

## 2015-03-14 MED ORDER — INSULIN ASPART 100 UNIT/ML ~~LOC~~ SOLN
0.0000 [IU] | Freq: Three times a day (TID) | SUBCUTANEOUS | Status: DC
  Administered 2015-03-14 – 2015-03-15 (×3): 2 [IU] via SUBCUTANEOUS
  Administered 2015-03-15 – 2015-03-16 (×4): 3 [IU] via SUBCUTANEOUS
  Administered 2015-03-17 (×2): 2 [IU] via SUBCUTANEOUS
  Administered 2015-03-17: 5 [IU] via SUBCUTANEOUS

## 2015-03-14 MED ORDER — ESCITALOPRAM OXALATE 5 MG PO TABS
5.0000 mg | ORAL_TABLET | Freq: Every day | ORAL | Status: DC
  Administered 2015-03-14 – 2015-03-16 (×3): 5 mg via ORAL
  Filled 2015-03-14 (×5): qty 1

## 2015-03-14 MED ORDER — LEVOTHYROXINE SODIUM 200 MCG PO TABS
200.0000 ug | ORAL_TABLET | Freq: Every day | ORAL | Status: DC
  Administered 2015-03-14 – 2015-03-17 (×4): 200 ug via ORAL
  Filled 2015-03-14 (×5): qty 1

## 2015-03-14 MED ORDER — WARFARIN SODIUM 5 MG PO TABS
5.0000 mg | ORAL_TABLET | ORAL | Status: DC
  Administered 2015-03-15 – 2015-03-16 (×2): 5 mg via ORAL
  Filled 2015-03-14 (×3): qty 1

## 2015-03-14 MED ORDER — CARVEDILOL 12.5 MG PO TABS
12.5000 mg | ORAL_TABLET | Freq: Two times a day (BID) | ORAL | Status: DC
  Administered 2015-03-14 – 2015-03-17 (×7): 12.5 mg via ORAL
  Filled 2015-03-14 (×9): qty 1

## 2015-03-14 MED ORDER — RESOURCE THICKENUP CLEAR PO POWD
ORAL | Status: DC | PRN
  Filled 2015-03-14: qty 125

## 2015-03-14 MED ORDER — SODIUM CHLORIDE 0.9 % IV BOLUS (SEPSIS): 500.0000 mL | Freq: Once | INTRAVENOUS | Status: DC

## 2015-03-14 MED ORDER — ISOSORBIDE MONONITRATE ER 60 MG PO TB24
90.0000 mg | ORAL_TABLET | Freq: Two times a day (BID) | ORAL | Status: DC
  Administered 2015-03-14 – 2015-03-17 (×7): 90 mg via ORAL
  Filled 2015-03-14 (×9): qty 1

## 2015-03-14 MED ORDER — ENSURE ENLIVE PO LIQD
237.0000 mL | Freq: Two times a day (BID) | ORAL | Status: DC
  Administered 2015-03-15 – 2015-03-16 (×4): 237 mL via ORAL

## 2015-03-14 MED ORDER — VANCOMYCIN HCL 10 G IV SOLR
1500.0000 mg | Freq: Once | INTRAVENOUS | Status: AC
  Administered 2015-03-14: 1500 mg via INTRAVENOUS
  Filled 2015-03-14: qty 1500

## 2015-03-14 MED ORDER — WARFARIN - PHARMACIST DOSING INPATIENT
Freq: Every day | Status: DC
  Administered 2015-03-14: 1
  Administered 2015-03-17: 18:00:00

## 2015-03-14 MED ORDER — BUDESONIDE-FORMOTEROL FUMARATE 160-4.5 MCG/ACT IN AERO
2.0000 | INHALATION_SPRAY | Freq: Two times a day (BID) | RESPIRATORY_TRACT | Status: DC
  Administered 2015-03-14 – 2015-03-16 (×6): 2 via RESPIRATORY_TRACT
  Filled 2015-03-14: qty 6

## 2015-03-14 MED ORDER — WARFARIN SODIUM 2.5 MG PO TABS
2.5000 mg | ORAL_TABLET | ORAL | Status: DC
  Administered 2015-03-14: 2.5 mg via ORAL
  Filled 2015-03-14: qty 1

## 2015-03-14 MED ORDER — FERROUS SULFATE 325 (65 FE) MG PO TABS
325.0000 mg | ORAL_TABLET | Freq: Every day | ORAL | Status: DC
  Administered 2015-03-14 – 2015-03-16 (×3): 325 mg via ORAL
  Filled 2015-03-14 (×4): qty 1

## 2015-03-14 MED ORDER — ATORVASTATIN CALCIUM 20 MG PO TABS
20.0000 mg | ORAL_TABLET | Freq: Every day | ORAL | Status: DC
  Administered 2015-03-14 – 2015-03-17 (×4): 20 mg via ORAL
  Filled 2015-03-14 (×4): qty 1

## 2015-03-14 MED ORDER — VANCOMYCIN HCL IN DEXTROSE 1-5 GM/200ML-% IV SOLN
1000.0000 mg | INTRAVENOUS | Status: DC
  Administered 2015-03-15 – 2015-03-17 (×3): 1000 mg via INTRAVENOUS
  Filled 2015-03-14 (×4): qty 200

## 2015-03-14 MED ORDER — ACETAMINOPHEN 325 MG PO TABS
650.0000 mg | ORAL_TABLET | Freq: Four times a day (QID) | ORAL | Status: DC | PRN
  Administered 2015-03-15: 650 mg via ORAL
  Filled 2015-03-14: qty 2

## 2015-03-14 MED ORDER — SODIUM CHLORIDE 0.9 % IV SOLN
INTRAVENOUS | Status: DC
  Administered 2015-03-14: 04:00:00 via INTRAVENOUS

## 2015-03-14 MED ORDER — TORSEMIDE 10 MG PO TABS
10.0000 mg | ORAL_TABLET | Freq: Every day | ORAL | Status: DC
  Filled 2015-03-14: qty 1

## 2015-03-14 MED ORDER — ADULT MULTIVITAMIN W/MINERALS CH
1.0000 | ORAL_TABLET | Freq: Every day | ORAL | Status: DC
  Administered 2015-03-14 – 2015-03-17 (×4): 1 via ORAL
  Filled 2015-03-14 (×4): qty 1

## 2015-03-14 MED ORDER — HYDROXYZINE HCL 50 MG/ML IM SOLN
25.0000 mg | Freq: Four times a day (QID) | INTRAMUSCULAR | Status: DC | PRN
  Filled 2015-03-14: qty 0.5

## 2015-03-14 MED ORDER — DEXTROSE 5 % IV SOLN
2.0000 g | Freq: Once | INTRAVENOUS | Status: AC
  Administered 2015-03-14: 2 g via INTRAVENOUS
  Filled 2015-03-14: qty 2

## 2015-03-14 MED ORDER — HYDRALAZINE HCL 50 MG PO TABS
100.0000 mg | ORAL_TABLET | Freq: Three times a day (TID) | ORAL | Status: DC
  Administered 2015-03-14 – 2015-03-17 (×9): 100 mg via ORAL
  Filled 2015-03-14 (×12): qty 2

## 2015-03-14 MED ORDER — VITAMIN B-12 1000 MCG PO TABS
1000.0000 ug | ORAL_TABLET | Freq: Every day | ORAL | Status: DC
  Administered 2015-03-14 – 2015-03-17 (×4): 1000 ug via ORAL
  Filled 2015-03-14 (×4): qty 1

## 2015-03-14 MED ORDER — DEXTROSE 5 % IV SOLN: 2.0000 g | Freq: Two times a day (BID) | INTRAVENOUS | Status: DC

## 2015-03-14 MED ORDER — DEXTROSE 5 % IV SOLN
1.0000 g | INTRAVENOUS | Status: DC
  Administered 2015-03-15 – 2015-03-17 (×3): 1 g via INTRAVENOUS
  Filled 2015-03-14 (×4): qty 1

## 2015-03-14 MED ORDER — AMLODIPINE BESYLATE 10 MG PO TABS
10.0000 mg | ORAL_TABLET | Freq: Every day | ORAL | Status: DC
  Administered 2015-03-14 – 2015-03-17 (×4): 10 mg via ORAL
  Filled 2015-03-14 (×4): qty 1

## 2015-03-14 NOTE — ED Notes (Signed)
MD at bedside. 

## 2015-03-14 NOTE — Progress Notes (Signed)
CARSON PRYOR [id number] Admission Data: 03/14/2015 4:57 AM Attending Provider: Lorretta Harp, MD GMW:NUUV Plotnikov, MD Code Status: Full  VAIL ARMOR is a 74 y.o. male patient admitted from ED:  -No acute distress noted.  -No complaints of shortness of breath.  -No complaints of chest pain.   Cardiac Monitoring: Box # 1 in place. Cardiac monitor yields:normal sinus rhythm.  Blood pressure 115/61, pulse 60, temperature 100.2 F (37.9 C), temperature source Rectal, resp. rate 13, SpO2 100 %.   IV Fluids:  IV in place, occlusive dsg intact without redness, IV cath hand right, condition patent and no redness normal saline.   Allergies:  Levothyroxine sodium; Chicken protein; and Eggs or egg-derived products  Past Medical History:   has a past medical history of Gout; HTN (hypertension); Hyperlipidemia; Osteoarthritis; History of CVA (cerebrovascular accident); GERD (gastroesophageal reflux disease); DJD (degenerative joint disease); BPH (benign prostatic hypertrophy); Peripheral vascular disease; Bilateral leg ulcer; CKD (chronic kidney disease) stage 3, GFR 30-59 ml/min; CAD (coronary artery disease); Chronic combined systolic and diastolic heart failure; LBBB (left bundle branch block); PAF (paroxysmal atrial fibrillation); NICM (nonischemic cardiomyopathy); cardiovascular stress test; Hypothyroidism; and DM (diabetes mellitus), type 2.  Past Surgical History:   has past surgical history that includes Cardiac catheterization (04-16-2011   [facility]); transthoracic echocardiogram (12-31-2010); AORTOGRAM W/ BILATERAL LOWER EXTREMITIY RUNOFF (05-18-2013  DR FIELDS); abdominal aortagram (N/A, 05/18/2013); lower extremity angiogram (Bilateral, 05/18/2013); right heart catheterization (N/A, 02/13/2015); and bi-ventricular implantable cardioverter defibrillator (N/A, 03/12/2015).  Social History:   reports that he quit smoking about 40 years ago. He has never used smokeless tobacco. He reports that he  does not drink alcohol or use illicit drugs.  Skin: charted on [facility]  Patient/Family orientated to room. Information packet given to patient/family. Admission inpatient armband information verified with patient/family to include name and date of birth and placed on patient arm. Side rails up x 2, fall assessment and education completed with patient/family. Patient/family able to verbalize understanding of risk associated with falls and verbalized understanding to call for assistance before getting out of bed. Call light within reach. Patient/family able to voice and demonstrate understanding of unit orientation instructions.

## 2015-03-14 NOTE — Consult Note (Signed)
Patient Demographics  Frank Mclean, is a 74 y.o. male, DOB - 02/24/1941, WUJ:[id number]  Admit date - 03/13/2015   Admitting Physician Lorretta Harp, MD  Outpatient Primary MD for the patient is [name], MD  LOS - 0   Chief Complaint  Patient presents with  . Weakness        Subjective:   Frank Mclean today has, No headache, No chest pain, No abdominal pain - No Nausea, No new weakness tingling or numbness, Improved Cough - SOB.    Assessment & Plan    1.HCAP - improved, continue empiric antibiotics follow cultures, continue supportive care with oxygen and nebulizer treatments. Question aspiration, for now nothing by mouth speech to evaluate.    2. Chronic systolic heart failure EF 30-35%, has AICD. Discharged by cardiology yesterday. Currently compensated. Continue combination of Coreg, hydralazine, Imdur, will resume low-dose diuretic. No ACE/ARB or Aldactone due to CK D.    3. CAD. Has left bundle branch block. Mildly elevated troponin likely due to #1 above. Cardiology consulted. Chest pain-free. Further management per cardiology. Not on aspirin since he is on Coumadin. Continue beta blocker and statin for secondary prevention.    4. Paroxysmal atrial fibrillation. CHADS2VASC score is 9 - on Coumadin and Coreg continue. Pharmacy monitoring INR.    5. CKD - 5 - he is lying creatinine close to 2.5. Monitor closely.     Code Status: Full  Family Communication: None  Disposition Plan: TBD   Procedures     Consults  Cards, Speech   Medications  Scheduled Meds: . amLODipine  10 mg Oral Daily  . atorvastatin  20 mg Oral Daily  . budesonide-formoterol  2 puff Inhalation BID  . carvedilol  12.5 mg Oral BID WC  . [START ON 03/15/2015] ceFEPime (MAXIPIME) IV  1 g  Intravenous Q24H  . cholecalciferol  1,000 Units Oral Daily  . escitalopram  5 mg Oral QHS  . ferrous sulfate  325 mg Oral Q breakfast  . hydrALAZINE  100 mg Oral TID  . insulin aspart  0-9 Units Subcutaneous TID WC  . isosorbide mononitrate  90 mg Oral BID  . levothyroxine  200 mcg Oral QAC breakfast  . [START ON 03/15/2015] vancomycin  1,000 mg Intravenous Q24H  . cyanocobalamin  1,000 mcg Oral Daily  . warfarin  2.5 mg Oral Q Fri-1800  . [START ON 03/15/2015] warfarin  5 mg Oral Once per day on Sun Mon Tue Wed Thu Sat  . Warfarin - Pharmacist Dosing Inpatient   Does not apply q1800   Continuous Infusions: . sodium chloride 50 mL/hr at 03/14/15 0538   PRN Meds:.acetaminophen, hydrOXYzine  DVT Prophylaxis  Coumadin  Lab Results  Component Value Date   INR 2.60* 03/14/2015   INR 2.88* 03/13/2015   INR 2.56* 03/13/2015   PROTIME 15.7 05/28/2009     Lab Results  Component Value Date   PLT 227 03/13/2015    Antibiotics     Anti-infectives    Start     Dose/Rate Route Frequency Ordered Stop   03/15/15 0500  ceFEPIme (MAXIPIME) 1 g in dextrose 5 % 50 mL IVPB     1 g 100 mL/hr over 30 Minutes Intravenous Every 24 hours 03/14/15  0455     03/15/15 0400  vancomycin (VANCOCIN) IVPB 1000 mg/200 mL premix     1,000 mg 200 mL/hr over 60 Minutes Intravenous Every 24 hours 03/14/15 0455     03/14/15 1000  ceFEPIme (MAXIPIME) 2 g in dextrose 5 % 50 mL IVPB  Status:  Discontinued     2 g 100 mL/hr over 30 Minutes Intravenous Every 12 hours 03/14/15 0455 03/14/15 0457   03/14/15 0315  vancomycin (VANCOCIN) 1,500 mg in sodium chloride 0.9 % 500 mL IVPB     1,500 mg 250 mL/hr over 120 Minutes Intravenous  Once 03/14/15 0240 03/14/15 0532   03/14/15 0245  ceFEPIme (MAXIPIME) 2 g in dextrose 5 % 50 mL IVPB     2 g 100 mL/hr over 30 Minutes Intravenous  Once 03/14/15 0240 03/14/15 0332          Objective:   Filed Vitals:   03/14/15 0400 03/14/15 0424 03/14/15 0458 03/14/15 0459    BP: 115/61 115/61  135/73  Pulse: 55 60  62  Temp:    98.6 F (37 C)  TempSrc:    Oral  Resp: 14 13  24   Height:   6\' 2"  (1.88 m)   Weight:   95.5 kg (210 lb 8.6 oz)   SpO2: 99% 100%  95%    Wt Readings from Last 3 Encounters:  03/14/15 95.5 kg (210 lb 8.6 oz)  03/13/15 92.08 kg (203 lb)  03/05/15 101.969 kg (224 lb 12.8 oz)     Intake/Output Summary (Last 24 hours) at 03/14/15 1038 Last data filed at 03/14/15 0929  Gross per 24 hour  Intake    500 ml  Output      0 ml  Net    500 ml     Physical Exam  Awake Alert, Oriented X 3, No new F.N deficits, Normal affect [place].AT,PERRAL Supple Neck,No JVD, No cervical lymphadenopathy appriciated.  Symmetrical Chest wall movement, Good air movement bilaterally, CTAB RRR,No Gallops,Rubs or new Murmurs, No Parasternal Heave +ve B.Sounds, Abd Soft, No tenderness, No organomegaly appriciated, No rebound - guarding or rigidity. No Cyanosis, Clubbing or edema, No new Rash or bruise      Data Review   Micro Results No results found for this or any previous visit (from the past 240 hour(s)).  Radiology Reports Dg Chest 2 View  03/14/2015   CLINICAL DATA:  Acute onset of generalized weakness and shortness of breath. Initial encounter.  EXAM: CHEST  2 VIEW  COMPARISON:  Chest radiograph performed 03/13/2015  FINDINGS: The lungs are well-aerated. Mild vascular congestion is noted. Mild left basilar opacity may reflect atelectasis or possibly minimal interstitial edema. There is no evidence of pleural effusion or pneumothorax.  The heart is enlarged. A pacemaker/AICD is noted at the left chest wall, with leads ending at the right atrium and right ventricle. No acute osseous abnormalities are seen.  IMPRESSION: Mild vascular congestion and cardiomegaly noted. Mild left basilar airspace opacity may reflect atelectasis or possibly minimal interstitial edema.   Electronically Signed   By: Roanna Raider M.D.   On: 03/14/2015 00:28   Dg Chest 2  View  03/13/2015   CLINICAL DATA:  74 year old male status post pacemaker placement  EXAM: CHEST  2 VIEW  COMPARISON:  Preoperative chest x-ray 03/10/2015  FINDINGS: New left subclavian approach cardiac rhythm maintenance device. Leads project over the right atrium and right ventricle. No evidence of pneumothorax or hemothorax. Stable cardiomegaly. Stable mediastinal contours. Atherosclerotic calcifications  in the aorta. Pulmonary hyperexpansion and central bronchitic change. No pulmonary edema. No acute osseous abnormality.  IMPRESSION: 1. New left subclavian approach cardiac rhythm maintenance device with leads projecting over the right atrium and right ventricle. 2. No evidence of pneumothorax, hemothorax or other complication.   Electronically Signed   By: Malachy Moan M.D.   On: 03/13/2015 07:43   Dg Chest 2 View  03/10/2015   CLINICAL DATA:  Acute onset of shortness of breath. Initial encounter.  EXAM: CHEST  2 VIEW  COMPARISON:  Chest radiograph performed 11/27/2013  FINDINGS: The lungs are well-aerated. Vascular congestion is noted, with bilateral central airspace opacities and a small left pleural effusion, likely reflecting mild recurrent pulmonary edema. No pneumothorax is seen.  The heart is enlarged.  No acute osseous abnormalities are seen.  IMPRESSION: Vascular congestion and cardiomegaly, with bilateral central airspace opacities and a small left pleural effusion, likely reflecting mild recurrent pulmonary edema.   Electronically Signed   By: Roanna Raider M.D.   On: 03/10/2015 22:54   Ct Head Wo Contrast  03/14/2015   CLINICAL DATA:  Acute onset of generalized weakness. Initial encounter.  EXAM: CT HEAD WITHOUT CONTRAST  TECHNIQUE: Contiguous axial images were obtained from the base of the skull through the vertex without intravenous contrast.  COMPARISON:  CT of the head performed 09/13/2013  FINDINGS: There is no evidence of acute infarction, mass lesion, or intra- or extra-axial  hemorrhage on CT.  Prominence of the ventricles and sulci reflects mild to moderate cortical volume loss. Cerebellar atrophy is noted. Scattered periventricular and subcortical white matter change likely reflects small vessel ischemic microangiopathy. A prominent chronic lacunar infarct is noted at the right thalamus, and a chronic lacunar infarct is seen at the left basal ganglia.  The brainstem and fourth ventricle are within normal limits. The cerebral hemispheres demonstrate grossly normal gray-white differentiation. No mass effect or midline shift is seen.  There is no evidence of fracture; visualized osseous structures are unremarkable in appearance. The orbits are within normal limits. The paranasal sinuses and mastoid air cells are well-aerated. No significant soft tissue abnormalities are seen.  IMPRESSION: 1. No acute intracranial pathology seen on CT. 2. Mild to moderate cortical volume loss and scattered small vessel ischemic microangiopathy. 3. 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   Electronically Signed   By: Roanna Raider M.D.   On: 03/14/2015 00:48   Ct Chest Wo Contrast  03/14/2015   CLINICAL DATA:  Acute onset of shortness of breath and generalized weakness. Initial encounter.  EXAM: CT CHEST WITHOUT CONTRAST  TECHNIQUE: Multidetector CT imaging of the chest was performed following the standard protocol without IV contrast.  COMPARISON:  Chest radiograph performed earlier today at 12:03 a.m.  FINDINGS: Patchy bibasilar airspace opacities are noted at the lung bases, in a peribronchovascular distribution, and at the left lingula. This is concerning for pneumonia. The lungs are otherwise clear. No pleural effusion or pneumothorax is seen. No masses are identified.  Scattered coronary artery calcifications are seen. The heart is mildly enlarged. A pacemaker is noted at the left chest wall, with leads ending at the right atrium and right ventricle. No pericardial  effusion is identified. No mediastinal lymphadenopathy is seen. Scattered calcification is noted along the aortic arch. The great vessels are grossly unremarkable in appearance. The thyroid gland is within normal limits. No axillary lymphadenopathy is seen.  The visualized portions of the liver and spleen are unremarkable, aside  from a calcified granuloma at the right hepatic lobe. Mildly increased attenuation within the gallbladder is nonspecific; no definite stones are seen. The visualized portions of the pancreas and adrenal glands are within normal limits. A 4.6 cm left renal cyst is noted. Mild calcification is noted along the proximal abdominal aorta  No acute osseous abnormalities are seen. Mild degenerative change is noted at the lower cervical spine.  IMPRESSION: 1. Patchy bibasilar airspace opacities at the lung bases, in a peribronchovascular distribution, and at the left lingula. This is concerning for multifocal pneumonia. 2. Scattered coronary artery calcifications seen. Mild cardiomegaly noted. 3. Left renal cyst noted. 4. Mild calcification along the proximal abdominal aorta.   Electronically Signed   By: Roanna Raider M.D.   On: 03/14/2015 02:32     CBC  Recent Labs Lab 03/10/15 2118 03/13/15 2358  WBC 9.5 14.9*  HGB 10.7* 10.0*  HCT 35.4* 32.6*  PLT 256 227  MCV 87.4 86.0  MCH 26.4 26.4  MCHC 30.2 30.7  RDW 15.8* 15.9*  LYMPHSABS 0.7 0.6*  MONOABS 0.5 1.1*  EOSABS 0.1 0.0  BASOSABS 0.0 0.0    Chemistries   Recent Labs Lab 03/10/15 2118 03/12/15 0329 03/13/15 0650 03/13/15 1030 03/13/15 2358  NA 147* 146* 145 147* 145  K 4.0 4.3 3.8 3.6 3.9  CL 110 109 110 110 110  CO2 29 27 24 27 28   GLUCOSE 89 89 120* 114* 106*  BUN 22 18 22 20  27*  CREATININE 2.46* 2.39* 2.28* 2.35* 2.75*  CALCIUM 9.1 8.9 8.9 9.0 8.8  AST  --   --   --   --  17  ALT  --   --   --   --  7  ALKPHOS  --   --   --   --  70  BILITOT  --   --   --   --  0.8    ------------------------------------------------------------------------------------------------------------------ estimated creatinine clearance is 27 mL/min (by C-G formula based on Cr of 2.75). ------------------------------------------------------------------------------------------------------------------ No results for input(s): HGBA1C in the last 72 hours. ------------------------------------------------------------------------------------------------------------------ No results for input(s): CHOL, HDL, LDLCALC, TRIG, CHOLHDL, LDLDIRECT in the last 72 hours. ------------------------------------------------------------------------------------------------------------------ No results for input(s): TSH, T4TOTAL, T3FREE, THYROIDAB in the last 72 hours.  Invalid input(s): FREET3 ------------------------------------------------------------------------------------------------------------------ No results for input(s): VITAMINB12, FOLATE, FERRITIN, TIBC, IRON, RETICCTPCT in the last 72 hours.  Coagulation profile  Recent Labs Lab 03/11/15 0437 03/12/15 0329 03/13/15 0650 03/13/15 2358 03/14/15 0805  INR 1.89* 2.21* 2.56* 2.88* 2.60*    No results for input(s): DDIMER in the last 72 hours.  Cardiac Enzymes  Recent Labs Lab 03/14/15 0036 03/14/15 0805  TROPONINI 0.22* 0.25*   ------------------------------------------------------------------------------------------------------------------ Invalid input(s): POCBNP     Time Spent in minutes   35   Joselinne Lawal K M.D on 03/14/2015 at 10:38 AM  Between 7am to 7pm - Pager - [phone]  After 7pm go to [email or web address] - password [facility]  [facility]   Office  [phone]

## 2015-03-14 NOTE — ED Notes (Signed)
Transporting patient to new room assignment. 

## 2015-03-14 NOTE — Evaluation (Signed)
Physical Therapy Evaluation Patient Details Name: RUSHTON DIVAN MRN: [id number] DOB: Feb 15, 1941 Today's Date: 03/14/2015   History of Present Illness  74 yo male with LBBB was fitted with defib with incomplete insertion, revision next week.  Due to outpatient status was forced to go home after procedure instead of SNF for rehab and fell at home necessitating return to hospital same day.  Found to now have PNA.  Clinical Impression  Pt admitted with/for fall post d/c from hospital for pacer placement. Now with PNA  Pt currently limited functionally due to the problems listed. ( See problems list.)   Pt will benefit from PT to maximize function and safety in order to get ready for next venue listed below.     Follow Up Recommendations Supervision/Assistance - 24 hour;CIR    Equipment Recommendations  None recommended by PT    Recommendations for Other Services Rehab consult     Precautions / Restrictions Precautions Precautions: Fall;ICD/Pacemaker Precaution Comments: LUE in sling Restrictions Weight Bearing Restrictions: No      Mobility  Bed Mobility Overal bed mobility: Needs Assistance Bed Mobility: Supine to Sit     Supine to sit: Max assist     General bed mobility comments: truncal assist to power up to EOB;  pt unable to bridge to EOB  Transfers Overall transfer level: Needs assistance Equipment used: 1 person hand held assist;Rolling walker (2 wheeled) Transfers: Sit to/from [employer] Sit to Stand: Total assist (x3 attempt and only attained semi-upright position with face)   Squat pivot transfers: Total assist     General transfer comment: Even at a very elevated level, pt unable to assist with Bil LE to help stand.  Very disproportionate amount of weakness for what he has been through in last 4 days  Ambulation/Gait             General Gait Details: unable  Stairs            Wheelchair Mobility    Modified Rankin (Stroke  Patients Only)       Balance Overall balance assessment: Needs assistance Sitting-balance support: No upper extremity supported;Single extremity supported Sitting balance-Leahy Scale: Fair Sitting balance - Comments: can not accept challenge without falling posteriorly     Standing balance-Leahy Scale: Zero Standing balance comment: Total assist to attain semi upright position blocking knee.  Much like a dead lift.                             Pertinent Vitals/Pain Pain Assessment: Faces Faces Pain Scale: Hurts even more Pain Location: knees Pain Descriptors / Indicators: Aching Pain Intervention(s): Monitored during session    Home Living Family/patient expects to be discharged to:: Private residence Living Arrangements: Spouse/significant other Available Help at Discharge: Family Type of Home: House Home Access: Stairs to enter Entrance Stairs-Rails: Right;Left;Can reach both Secretary/administrator of Steps: 5 Home Layout: Multi-level Home Equipment: Environmental consultant - 2 wheels;Cane - single point      Prior Function Level of Independence: Independent with assistive device(s)               Hand Dominance        Extremity/Trunk Assessment   Upper Extremity Assessment: Defer to OT evaluation           Lower Extremity Assessment: RLE deficits/detail;LLE deficits/detail RLE Deficits / Details: grossly 3-/5 LLE Deficits / Details: grossly 3- to 3/5  Cervical / Trunk Assessment: Normal  Communication   Communication: No difficulties  Cognition Arousal/Alertness: Awake/alert Behavior During Therapy: WFL for tasks assessed/performed Overall Cognitive Status: Within Functional Limits for tasks assessed                      General Comments      Exercises        Assessment/Plan    PT Assessment Patient needs continued PT services  PT Diagnosis Generalized weakness;Acute pain   PT Problem List Decreased strength;Decreased range of  motion;Decreased activity tolerance;Decreased balance;Decreased mobility;Decreased coordination;Decreased safety awareness;Decreased knowledge of precautions;Cardiopulmonary status limiting activity;Decreased skin integrity;Pain  PT Treatment Interventions DME instruction;Gait training;Stair training;Functional mobility training;Therapeutic activities;Therapeutic exercise;Balance training;Neuromuscular re-education;Patient/family education   PT Goals (Current goals can be found in the Care Plan section) Acute Rehab PT Goals Patient Stated Goal: to get up and in chair PT Goal Formulation: With patient Time For Goal Achievement: 03/27/15 Potential to Achieve Goals: Good    Frequency Min 3X/week   Barriers to discharge Inaccessible home environment;Decreased caregiver support      Co-evaluation               End of Session Equipment Utilized During Treatment: Gait belt Activity Tolerance: Patient tolerated treatment well;Patient limited by pain;Patient limited by fatigue Patient left: with call bell/phone within reach;in chair;with family/visitor present;Other (comment) (on lift pad) Nurse Communication: Mobility status         Time: 4098-1191 PT Time Calculation (min) (ACUTE ONLY): 30 min   Charges:   PT Evaluation $Initial PT Evaluation Tier I: 1 Procedure PT Treatments $Therapeutic Activity: 8-22 mins   PT G Codes:        Lahela Woodin, Eliseo Gum 03/14/2015, 5:18 PM  03/14/2015  Drumright Bing, PT [phone]  (pager)

## 2015-03-14 NOTE — Progress Notes (Signed)
Attempted to get report, RN unavailable at this time, left phone number.

## 2015-03-14 NOTE — ED Notes (Signed)
Pt taken to CT.

## 2015-03-14 NOTE — H&P (Signed)
[facility] History and Physical  EARLE TROIANO AOZ:[id number] DOB: 17-Mar-1941 DOA: 03/13/2015  Referring physician: ED physician PCP: [name], MD  Specialists:   Chief Complaint: Generalized weakness  HPI: Frank Mclean is a 74 y.o. male with past medical history of hypertension, GERD, hyperlipidemia, gout, hypothyroidism, LBBB, PVD, chronic kidney disease-stage III, coronary artery disease, combined systolic and diastolic congestive heart failure (Ef 30 to 35% with grade 3 diastolic dysfunction), paroxysmal atrial fibrillation on Coumadin, diabetes mellitus, history of stroke, who presents with a generalized weakness.  Patient was recently hospitalized from 3/21-3/24/16 for pacemaker placement. He was discharged home yesterday morning. Patient reports that he has been feeling weak in the past 3 days, which has been progressively getting worse after he went home. Pt states that he is not able to ambulate on his own. He also has shortness of breath, nausea and vomiting. He vomited twice. He does not have cough. He has a fever and chills. Patient does not have chest pain. Pt states that he wears oxygen at home nightly. Patient denies abdominal pain, diarrhea, dysuria, urgency, frequency, hematuria, skin rashes or leg swelling. No unilateral weakness, numbness or tingling sensations. No vision change or hearing loss. Pt seems to be mild confused per his wife according ED.  In ED, patient was found to have multifocal pneumonia on chest x-ray. Elevated troponin 0.22. Lactate 1.89, INR 2.88, negative urinalysis, WBC 14.9, temperature 100.2, tachycardia. Patient is admitted to inpatient for further evaluation and treatment.  Review of Systems: As presented in the history of presenting illness, rest negative.  Where does patient live?  At home Can patient participate in ADLs? barely  Allergy:  Allergies  Allergen Reactions  . Levothyroxine Sodium Other (See Comments)    MUST TAKE BRAND  NAME GENERIC DOESN'T WORK FOR PATIENT  . Chicken Protein     Does not like chicken or [place]  . Eggs Or Egg-Derived Products     Does not like eggs    Past Medical History  Diagnosis Date  . Gout   . HTN (hypertension)   . Hyperlipidemia   . Osteoarthritis   . History of CVA (cerebrovascular accident)     Left pontine infarct July 2004; changed from Plavix to Coumadin in 2004 per MD at [facility]  . GERD (gastroesophageal reflux disease)   . DJD (degenerative joint disease)   . BPH (benign prostatic hypertrophy)   . Peripheral vascular disease   . Bilateral leg ulcer     ACHILLES AREA--  NONHEALING  . CKD (chronic kidney disease) stage 3, GFR 30-59 ml/min   . CAD (coronary artery disease)     a. LHC 4/12: Mid LAD 25%, mid diagonal 30%, AV circumflex 40%, proximal OM 25%, distal RCA 40%  . Chronic combined systolic and diastolic heart failure     a. Echo 05/2013: EF 25-30%, diffuse HK, restrictive physiology, trivial AI, mild MR, moderate LAE, reduced RV systolic function, PASP 42  . LBBB (left bundle branch block)   . PAF (paroxysmal atrial fibrillation)     a. amiodarone Rx started 05/2013;  b. chronic coumadin  . NICM (nonischemic cardiomyopathy)     a. EF 25-30%.  [name] Hx of cardiovascular stress test     Nuclear Stress Test (1/16): High risk stress nuclear study with a large, severe, partially reversible inferior and apical defect consistent with prior inferior and apical infarct; mild apical ischemia; severe LVE; study high risk due to reduced LV function.  EF 25% >> reviewed  with Dr. Antoine Poche >> medical mgmt  . Hypothyroidism   . DM (diabetes mellitus), type 2     Past Surgical History  Procedure Laterality Date  . Cardiac catheterization  04-16-2011   DR [facility]    NON-OBSTRUCTIVE CAD. MILDLY ELEVATED PULMONARY PRESSURES/ ELEVATED  END-DIASTOLIC PRESSURE  . Transthoracic echocardiogram  12-31-2010    MODERATE CONCENTRIC LVH/ SYSTOLIC FUNCTION SEVERELY REDUCED/ EF 25-30%/   SEVERE HYPOKINESIS OF ANTEROSEPTAL MYOCARDIUM  AND ENTIREAPICAL MYOCARDIUM /  MODERATE HYPOKINESIS OF LATERAL, INFEROLATERAL, INFERIOR,AND INFEROSEPTAL MYOCARIUM/  GRADE 3 DIASTOLIC DYSFUNCTION/ MILD MR  . Aortogram w/ bilateral lower extremitiy runoff  05-18-2013  DR FIELDS    LEFT LEG OCCLUDED PERONEAL AND ANTERIOR TIBIAL ARTERIES/ HIGH GRADE STENOIS 80% MIDDLE AND DISTAL THIRD OF POSTERIOR TIBIAL ARTERY/ RIGHT PERONEAL AND ANTERIOR TIBIAL ARTERY OCCLUDED/ 40% STENOSIS DISTALLY   . Abdominal aortagram N/A 05/18/2013    Procedure: ABDOMINAL Ronny Flurry;  Surgeon: Sherren Kerns, MD;  Location: [facility] CATH LAB;  Service: Cardiovascular;  Laterality: N/A;  . Lower extremity angiogram Bilateral 05/18/2013    Procedure: LOWER EXTREMITY ANGIOGRAM;  Surgeon: Sherren Kerns, MD;  Location: [facility] CATH LAB;  Service: Cardiovascular;  Laterality: Bilateral;  . Right heart catheterization N/A 02/13/2015    Procedure: RIGHT HEART CATH;  Surgeon: Laurey Morale, MD;  Location: [facility] CATH LAB;  Service: Cardiovascular;  Laterality: N/A;  . Bi-ventricular implantable cardioverter defibrillator N/A 03/12/2015    Procedure: BI-VENTRICULAR IMPLANTABLE CARDIOVERTER DEFIBRILLATOR  (CRT-D);  Surgeon: Marinus Maw, MD;  Location: [facility] CATH LAB;  Service: Cardiovascular;  Laterality: N/A;    Social History:  reports that he quit smoking about 40 years ago. He has never used smokeless tobacco. He reports that he does not drink alcohol or use illicit drugs.  Family History:  Family History  Problem Relation Age of Onset  . Hypertension Mother   . Heart disease Mother   . Diabetes Mother   . Gout Other   . Stroke Other   . Hypertension Father   . Heart disease Father   . Heart attack Maternal Grandmother   . Thyroid disease Neg Hx      Prior to Admission medications   Medication Sig Start Date End Date Taking? Authorizing Provider  amLODipine (NORVASC) 10 MG tablet Take 10 mg by mouth daily.    Historical Provider, MD   atorvastatin (LIPITOR) 20 MG tablet Take 1 tablet (20 mg total) by mouth daily. 01/16/15   Beatrice Lecher, PA-C  carvedilol (COREG) 12.5 MG tablet Take 1 tablet (12.5 mg total) by mouth 2 (two) times daily with a meal. 03/13/15   Janetta Hora, PA-C  cholecalciferol (VITAMIN D) 1000 UNITS tablet Take 1,000 Units by mouth daily.    Historical Provider, MD  [employer] VITAMIN B12 1000 MCG tablet TAKE 1 TABLET BY MOUTH DAILY 01/27/15   Aleksei Plotnikov V, MD  escitalopram (LEXAPRO) 5 MG tablet Take 1 tablet (5 mg total) by mouth daily. Patient taking differently: Take 5 mg by mouth at bedtime.  02/03/15   Aleksei Plotnikov V, MD  ferrous sulfate 325 (65 FE) MG tablet TAKE 1 TABLET BY MOUTH EVERY DAY 11/19/14   Aleksei Plotnikov V, MD  glipiZIDE (GLUCOTROL) 5 MG tablet TAKE 1/2 TABLET BY MOUTH TWICE A DAY BEFORE A MEAL 01/27/15   Aleksei Plotnikov V, MD  hydrALAZINE (APRESOLINE) 100 MG tablet Take 1 tablet (100 mg total) by mouth 3 (three) times daily. 03/05/15   Marinus Maw, MD  isosorbide mononitrate (  IMDUR) 60 MG 24 hr tablet Take 1.5 tablets for 90 mg by mouth twice a day. Patient taking differently: Take 90 mg by mouth 2 (two) times daily. Take 1.5 tablets for 90 mg by mouth twice a day. 03/05/15   Marinus Maw, MD  metolazone (ZAROXOLYN) 5 MG tablet Take 5 mg by mouth as needed (fluid).  08/12/14   Rollene Rotunda, MD  OXYGEN Inhale 2.5 L into the lungs See admin instructions. USES OXYGEN EVERY BEDTIME USES DURING DAY ONLY AS NEEDED    Historical Provider, MD  potassium chloride SA (KLOR-CON M20) 20 MEQ tablet Take 3 tabs TWICE DAILY Patient taking differently: 60 mEq 2 (two) times daily. Take 3 tabs TWICE DAILY 01/27/15   Rollene Rotunda, MD  SYMBICORT 160-4.5 MCG/ACT inhaler INHALE 2 PUFFS INTO THE LUNGS 2 TIMES DAILY 11/28/14   Aleksei Plotnikov V, MD  SYNTHROID 100 MCG tablet TAKE 2 TABLETS BY MOUTH EVERY MORNING BEFORE BREAKFAST 01/27/15   Aleksei Plotnikov V, MD  torsemide (DEMADEX) 20 MG tablet Take  1-2 tablets (20-40 mg total) by mouth 2 (two) times daily. Please take 20mg  in the AM and 40 in the PM 03/13/15   Janetta Hora, PA-C  warfarin (COUMADIN) 5 MG tablet TAKE 1 TABLET (5 MG TOTAL) BY MOUTH AS DIRECTED. 01/17/15   Tresa Garter, MD    Physical Exam: Filed Vitals:   03/14/15 0230 03/14/15 0300 03/14/15 0312 03/14/15 0330  BP: 129/70 127/64 127/64 124/68  Pulse: 63 60 65 65  Temp:      TempSrc:      Resp: 14 15 13 15   SpO2: 98% 98% 100% 98%   General: Not in acute distress. Looks very tired HEENT:       Eyes: PERRL, EOMI, no scleral icterus       ENT: No discharge from the ears and nose, no pharynx injection, no tonsillar enlargement.        Neck: No JVD, no bruit, no mass felt. Cardiac: S1/S2, RRR, No murmurs, No gallops or rubs Pulm: coarse breathing sound, but no rales orrubs. New pace maker placed area is warm but no purulence with stirrups in place.   Abd: Soft, nondistended, nontender, no rebound pain, no organomegaly, BS present Ext: No edema bilaterally. 2+DP/PT pulse bilaterally Musculoskeletal: No joint deformities, erythema, or stiffness, ROM full Skin: No rashes.  Chronic venous insufficiency change Neuro: Alert and oriented X3, cranial nerves II-XII grossly intact, muscle strength 5/5 in all extremeties, sensation to light touch intact.  Psych: Patient is not psychotic, no suicidal or hemocidal ideation.  Labs on Admission:  Basic Metabolic Panel:  Recent Labs Lab 03/10/15 2118 03/12/15 0329 03/13/15 0650 03/13/15 1030 03/13/15 2358  NA 147* 146* 145 147* 145  K 4.0 4.3 3.8 3.6 3.9  CL 110 109 110 110 110  CO2 29 27 24 27 28   GLUCOSE 89 89 120* 114* 106*  BUN 22 18 22 20  27*  CREATININE 2.46* 2.39* 2.28* 2.35* 2.75*  CALCIUM 9.1 8.9 8.9 9.0 8.8   Liver Function Tests:  Recent Labs Lab 03/13/15 2358  AST 17  ALT 7  ALKPHOS 70  BILITOT 0.8  PROT 5.8*  ALBUMIN 3.1*    Recent Labs Lab 03/13/15 2358  LIPASE 14   No results  for input(s): AMMONIA in the last 168 hours. CBC:  Recent Labs Lab 03/10/15 2118 03/13/15 2358  WBC 9.5 14.9*  NEUTROABS 8.2* 13.1*  HGB 10.7* 10.0*  HCT 35.4* 32.6*  MCV  87.4 86.0  PLT 256 227   Cardiac Enzymes:  Recent Labs Lab 03/14/15 0036  TROPONINI 0.22*    BNP (last 3 results)  Recent Labs  03/10/15 2118  BNP 825.5*    ProBNP (last 3 results)  Recent Labs  12/27/14 0952 12/31/14 1111 01/23/15 1246  PROBNP 451.0* 245.0* 432.0*    CBG:  Recent Labs Lab 03/12/15 1135 03/12/15 1959 03/13/15 0806 03/13/15 1208 03/14/15 0303  GLUCAP 150* 98 111* 101* 103*    Radiological Exams on Admission: Dg Chest 2 View  03/14/2015   CLINICAL DATA:  Acute onset of generalized weakness and shortness of breath. Initial encounter.  EXAM: CHEST  2 VIEW  COMPARISON:  Chest radiograph performed 03/13/2015  FINDINGS: The lungs are well-aerated. Mild vascular congestion is noted. Mild left basilar opacity may reflect atelectasis or possibly minimal interstitial edema. There is no evidence of pleural effusion or pneumothorax.  The heart is enlarged. A pacemaker/AICD is noted at the left chest wall, with leads ending at the right atrium and right ventricle. No acute osseous abnormalities are seen.  IMPRESSION: Mild vascular congestion and cardiomegaly noted. Mild left basilar airspace opacity may reflect atelectasis or possibly minimal interstitial edema.   Electronically Signed   By: Roanna Raider M.D.   On: 03/14/2015 00:28   Dg Chest 2 View  03/13/2015   CLINICAL DATA:  74 year old male status post pacemaker placement  EXAM: CHEST  2 VIEW  COMPARISON:  Preoperative chest x-ray 03/10/2015  FINDINGS: New left subclavian approach cardiac rhythm maintenance device. Leads project over the right atrium and right ventricle. No evidence of pneumothorax or hemothorax. Stable cardiomegaly. Stable mediastinal contours. Atherosclerotic calcifications in the aorta. Pulmonary hyperexpansion  and central bronchitic change. No pulmonary edema. No acute osseous abnormality.  IMPRESSION: 1. New left subclavian approach cardiac rhythm maintenance device with leads projecting over the right atrium and right ventricle. 2. No evidence of pneumothorax, hemothorax or other complication.   Electronically Signed   By: Malachy Moan M.D.   On: 03/13/2015 07:43   Ct Head Wo Contrast  03/14/2015   CLINICAL DATA:  Acute onset of generalized weakness. Initial encounter.  EXAM: CT HEAD WITHOUT CONTRAST  TECHNIQUE: Contiguous axial images were obtained from the base of the skull through the vertex without intravenous contrast.  COMPARISON:  CT of the head performed 09/13/2013  FINDINGS: There is no evidence of acute infarction, mass lesion, or intra- or extra-axial hemorrhage on CT.  Prominence of the ventricles and sulci reflects mild to moderate cortical volume loss. Cerebellar atrophy is noted. Scattered periventricular and subcortical white matter change likely reflects small vessel ischemic microangiopathy. A prominent chronic lacunar infarct is noted at the right thalamus, and a chronic lacunar infarct is seen at the left basal ganglia.  The brainstem and fourth ventricle are within normal limits. The cerebral hemispheres demonstrate grossly normal gray-white differentiation. No mass effect or midline shift is seen.  There is no evidence of fracture; visualized osseous structures are unremarkable in appearance. The orbits are within normal limits. The paranasal sinuses and mastoid air cells are well-aerated. No significant soft tissue abnormalities are seen.  IMPRESSION: 1. No acute intracranial pathology seen on CT. 2. Mild to moderate cortical volume loss and scattered small vessel ischemic microangiopathy. 3. 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   Electronically Signed   By: Roanna Raider M.D.   On: 03/14/2015 00:48   Ct Chest Wo Contrast  03/14/2015   CLINICAL  DATA:   Acute onset of shortness of breath and generalized weakness. Initial encounter.  EXAM: CT CHEST WITHOUT CONTRAST  TECHNIQUE: Multidetector CT imaging of the chest was performed following the standard protocol without IV contrast.  COMPARISON:  Chest radiograph performed earlier today at 12:03 a.m.  FINDINGS: Patchy bibasilar airspace opacities are noted at the lung bases, in a peribronchovascular distribution, and at the left lingula. This is concerning for pneumonia. The lungs are otherwise clear. No pleural effusion or pneumothorax is seen. No masses are identified.  Scattered coronary artery calcifications are seen. The heart is mildly enlarged. A pacemaker is noted at the left chest wall, with leads ending at the right atrium and right ventricle. No pericardial effusion is identified. No mediastinal lymphadenopathy is seen. Scattered calcification is noted along the aortic arch. The great vessels are grossly unremarkable in appearance. The thyroid gland is within normal limits. No axillary lymphadenopathy is seen.  The visualized portions of the liver and spleen are unremarkable, aside from a calcified granuloma at the right hepatic lobe. Mildly increased attenuation within the gallbladder is nonspecific; no definite stones are seen. The visualized portions of the pancreas and adrenal glands are within normal limits. A 4.6 cm left renal cyst is noted. Mild calcification is noted along the proximal abdominal aorta  No acute osseous abnormalities are seen. Mild degenerative change is noted at the lower cervical spine.  IMPRESSION: 1. Patchy bibasilar airspace opacities at the lung bases, in a peribronchovascular distribution, and at the left lingula. This is concerning for multifocal pneumonia. 2. Scattered coronary artery calcifications seen. Mild cardiomegaly noted. 3. Left renal cyst noted. 4. Mild calcification along the proximal abdominal aorta.   Electronically Signed   By: Roanna Raider M.D.   On:  03/14/2015 02:32    EKG: Independently reviewed. Left bundle block which is old, QTC 623  Assessment/Plan Principal Problem:   HCAP (healthcare-associated pneumonia) Active Problems:   DM (diabetes mellitus), type 2 with renal complications   B12 deficiency   HLD (hyperlipidemia)   Cerebral artery occlusion with cerebral infarction   GERD   CKD (chronic kidney disease) stage 3, GFR 30-59 ml/min   BPH (benign prostatic hyperplasia)   Osteoarthritis   LBBB (left bundle branch block)   CAD (coronary artery disease)   COPD with bronchitis   Chronic venous insufficiency   Gait disorder   Anticoagulated on Coumadin   PAF (paroxysmal atrial fibrillation)   Hypothyroidism   Sepsis   ICD (implantable cardioverter-defibrillator) in place   Elevated troponin   Weakness generalized  HCAP and sepsis: Patient's generalized weakness is most likely due to multifactorial, including new HCAP and multiple chronic comorbidities. Patient is septic on admission, with leukocytosis, fever and tachycardia. He is hemodynamically stable currently.  - Will admit to Telemetry Bed - IV Vancomycin and cefepime  - Urine legionella and S. pneumococcal antigen - IVF: Patient has severe congestive heart failure with EF of 30-35%, cannot give aggressive fluid treatment. Received 500 mL normal saline bolus, followed by gentle IV fluid 54mL/h  - continue home Symbicort inhaler - Follow up blood culture x2, sputum culture and respiratory virus panel, plus Flu pcr - Hydraxyzine for Nausea (QTc 623, not a good candidate for Zofran) - will get Procalcitonin and trend lactic acid level  CAD and elevated trop: Patient had cardiac cath in 4/12 with mild nonobstructive CAD.He also had Cardiolite in 1/16, which showed a primarily fixed large inferior and apical perfusion defect. This likely represents prior infarction  with minimal peri-infarct ischemia (versus artifact) per cardiologist. His troponin is elevated  slightly at 0.22, but no any chest pain. This is likely due to demanding ischemia secondary to sepsis. -He is on warfarin with INR 2.88 -continue Coreg and Imdur -Continue Lipitor - trop x 3  Paroxysmal atrial fibrillation: CHA2DS2-VASc Score is 8, needs oral anticoagulation. Patient is on coumadin at home. INR is 2.88 on admission. He is in NSR today.  -continue coumadin -Continue Coreg  Combined systolic and diastolic congestive heart failure: 2-D echo on 02/25/05 showed EF 30-35% with grade 3 diastolic dysfunction. Patient is on torsemide and metolazone at home. His CHF is compensated on admission. He does not have any leg edema. -hold diuretics since patient needs IV fluid for sepsis -check BNP  CKD: Stage III-IV: Follows with Dr Lowell Guitar. Baseline creatinine is a 2.2-2.6. His creatinine is slightly elevated at 2.75 on admission. -hold diuretics as above -Gentle IV fluid as above -Follow-up renal function BMP  Hyperlipidemia: LDL was186 on 06/24/14 -continue Lipitor  Hypertension: -Continue amlodipine and hydralazine  Hypothyroidism: TSH was 6.041/4/60 -Continue Synthroid  Diabetes mellitus: A1c was 5.8 on 7/60/15. Patient is on glipizide at home. -Switch to sliding scale insulin  Disposition at discharge: in previous admission,  PT recommended SNF due to gait instability and deconditioning, but hospital not covering this and patient unable to pay out of pocket. went home with home health PT, RN and AID. The situation has not changed.  DVT ppx: on coumadin with INR 2.88  Code Status: Full code Family Communication: None at bed side.    Disposition Plan: Admit to inpatient   Date of Service 03/14/2015    Lorretta Harp [facility] Pager [phone]  If 7PM-7AM, please contact night-coverage [email or web address] Password [facility] 03/14/2015, 3:34 AM

## 2015-03-14 NOTE — Progress Notes (Signed)
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  INTERVENTION: Provide Ensure Enlive BID, each contains 350 kcal, 20 grams of protein, 180 ml of free water  Provide Magic Cup BID, each contains 290 kcal, 9 grams of protein  Provide MVI daily   NUTRITION DIAGNOSIS: Inadequate oral intake related to decreased appetite as evidenced by <75% PO intake for >1 month.   Goal: Pt to meet >/= 90% of estimated energy needs  Monitor:  Diet advancement, PO intake, weight trends, labs  Reason for Assessment: MST 2  74 y.o. male  Admitting Dx: HCAP (healthcare-associated pneumonia)  ASSESSMENT: 74 y/o male was discharged on 3/23 for pacer/defib placement. He presents with continuing weakness,and is unable to ambulate on his own. He also has SOB, nausea and x 2 episodes of vomiting. In ED, pt was found to have multifocal pneumonia on chest xray.   Weight history reveals 6% weight loss in <3 months. Pt confirmed weight loss, and reported having a decreased appetite for the past two months. He denied any nausea or abdominal pain. Pt felt hungry at time of assessment, and was eager for diet advancement.  Discussed heart healthy meal options, and encouraged pt to continue eating small meals throughout the day once diet is advanced. SLP to complete swallow evaluation today. Pt drinks Ensure BID at home, and takes MVI. Talked to pt about Magic Cup, and he was eager to try it.   Labs and medications reviewed: Elevated BUN, creatine   Nutrition Focused Physical Exam:  Subcutaneous Fat:  Orbital Region: mild depletion Upper Arm Region: WDL Thoracic and Lumbar Region: n/a  Muscle:  Temple Region: mild  depletion Clavicle Bone Region: mild depletion Clavicle and Acromion Bone Region: mild depletion  Scapular Bone Region: n/a Dorsal Hand: WDL Patellar Region: WDL Anterior Thigh Region: WDL Posterior Calf Region: WDL  Edema: not present   Height: Ht Readings from Last 1 Encounters:  03/14/15 6\' 2"  (1.88 m)    Weight: Wt Readings from Last 1 Encounters:  03/14/15 210 lb 8.6 oz (95.5 kg)    Ideal Body Weight: 190 lb (86.4 kg)  % Ideal Body Weight: 110%  Wt Readings from Last 10 Encounters:  03/14/15 210 lb 8.6 oz (95.5 kg)  03/13/15 203 lb (92.08 kg)  03/05/15 224 lb 12.8 oz (101.969 kg)  02/26/15 221 lb 12.8 oz (100.608 kg)  02/10/15 218 lb (98.884 kg)  02/06/15 220 lb 12.8 oz (100.154 kg)  02/03/15 222 lb (100.699 kg)  01/27/15 222 lb (100.699 kg)  01/23/15 224 lb (101.606 kg)  01/16/15 224 lb (101.606 kg)    Usual Body Weight: unknown  % Usual Body Weight: -  BMI:  Body mass index is 27.02 kg/(m^2). Overweight   Estimated Nutritional Needs: Kcal: 1950-2150 Protein: 100-120 grams Fluid: >/= 2L daily  Skin: intact  Diet Order: Diet NPO time specified  EDUCATION NEEDS: -No education needs identified at this time   Intake/Output Summary (Last 24 hours) at 03/14/15 0941 Last data filed at 03/14/15 0929  Gross per 24 hour  Intake    500 ml  Output      0 ml  Net    500 ml    Last BM: pta  Labs:   Recent Labs Lab 03/13/15  1610 03/13/15 1030 03/13/15 2358  NA 145 147* 145  K 3.8 3.6 3.9  CL 110 110 110  CO2 BUN 22 20 27*  CREATININE 2.28* 2.35* 2.75*  CALCIUM 8.9 9.0 8.8  GLUCOSE 120* 114* 106*    CBG (last 3)   Recent Labs  03/13/15 1208 03/14/15 0303 03/14/15 0807  GLUCAP 101* 103* 93    Scheduled Meds: . amLODipine  10 mg Oral Daily  . atorvastatin  20 mg Oral Daily  . budesonide-formoterol  2 puff Inhalation BID  . carvedilol  12.5 mg Oral BID WC  . [START ON 03/15/2015] ceFEPime (MAXIPIME) IV  1 g Intravenous  Q24H  . cholecalciferol  1,000 Units Oral Daily  . escitalopram  5 mg Oral QHS  . ferrous sulfate  325 mg Oral Q breakfast  . hydrALAZINE  100 mg Oral TID  . insulin aspart  0-9 Units Subcutaneous TID WC  . isosorbide mononitrate  90 mg Oral BID  . levothyroxine  200 mcg Oral QAC breakfast  . [START ON 03/15/2015] vancomycin  1,000 mg Intravenous Q24H  . cyanocobalamin  1,000 mcg Oral Daily  . warfarin  2.5 mg Oral Q Fri-1800  . [START ON 03/15/2015] warfarin  5 mg Oral Once per day on Sun Mon Tue Wed Thu Sat  . Warfarin - Pharmacist Dosing Inpatient   Does not apply q1800    Continuous Infusions: . sodium chloride 50 mL/hr at 03/14/15 9604    Past Medical History  Diagnosis Date  . Gout   . HTN (hypertension)   . Hyperlipidemia   . Osteoarthritis   . History of CVA (cerebrovascular accident)     Left pontine infarct July 2004; changed from Plavix to Coumadin in 2004 per MD at [facility]  . GERD (gastroesophageal reflux disease)   . DJD (degenerative joint disease)   . BPH (benign prostatic hypertrophy)   . Peripheral vascular disease   . Bilateral leg ulcer     ACHILLES AREA--  NONHEALING  . CKD (chronic kidney disease) stage 3, GFR 30-59 ml/min   . CAD (coronary artery disease)     a. LHC 4/12: Mid LAD 25%, mid diagonal 30%, AV circumflex 40%, proximal OM 25%, distal RCA 40%  . Chronic combined systolic and diastolic heart failure     a. Echo 05/2013: EF 25-30%, diffuse HK, restrictive physiology, trivial AI, mild MR, moderate LAE, reduced RV systolic function, PASP 42  . LBBB (left bundle branch block)   . PAF (paroxysmal atrial fibrillation)     a. amiodarone Rx started 05/2013;  b. chronic coumadin  . NICM (nonischemic cardiomyopathy)     a. EF 25-30%.  [name] Hx of cardiovascular stress test     Nuclear Stress Test (1/16): High risk stress nuclear study with a large, severe, partially reversible inferior and apical defect consistent with prior inferior and apical infarct; mild  apical ischemia; severe LVE; study high risk due to reduced LV function.  EF 25% >> reviewed with Dr. Antoine Poche >> medical mgmt  . Hypothyroidism   . DM (diabetes mellitus), type 2     Past Surgical History  Procedure Laterality Date  . Cardiac catheterization  04-16-2011   [facility]    NON-OBSTRUCTIVE CAD. MILDLY ELEVATED PULMONARY PRESSURES/ ELEVATED  END-DIASTOLIC PRESSURE  . Transthoracic echocardiogram  12-31-2010    MODERATE CONCENTRIC LVH/ SYSTOLIC FUNCTION SEVERELY REDUCED/ EF 25-30%/  SEVERE HYPOKINESIS OF ANTEROSEPTAL MYOCARDIUM  AND ENTIREAPICAL MYOCARDIUM /  MODERATE HYPOKINESIS OF  LATERAL, INFEROLATERAL, INFERIOR,AND INFEROSEPTAL MYOCARIUM/  GRADE 3 DIASTOLIC DYSFUNCTION/ MILD MR  . Aortogram w/ bilateral lower extremitiy runoff  05-18-2013  DR FIELDS    LEFT LEG OCCLUDED PERONEAL AND ANTERIOR TIBIAL ARTERIES/ HIGH GRADE STENOIS 80% MIDDLE AND DISTAL THIRD OF POSTERIOR TIBIAL ARTERY/ RIGHT PERONEAL AND ANTERIOR TIBIAL ARTERY OCCLUDED/ 40% STENOSIS DISTALLY   . Abdominal aortagram N/A 05/18/2013    Procedure: ABDOMINAL Ronny Flurry;  Surgeon: Sherren Kerns, MD;  Location[facility] CATH LAB;  Service: Cardiovascular;  Laterality: N/A;  . Lower extremity angiogram Bilateral 05/18/2013    Procedure: LOWER EXTREMITY ANGIOGRAM;  Surgeon: Sherren Kerns, MD;  Location: [facility] CATH LAB;  Service: Cardiovascular;  Laterality: Bilateral;  . Right heart catheterization N/A 02/13/2015    Procedure: RIGHT HEART CATH;  Surgeon: Laurey Morale, MD;  Location: [facility] CATH LAB;  Service: Cardiovascular;  Laterality: N/A;  . Bi-ventricular implantable cardioverter defibrillator N/A 03/12/2015    Procedure: BI-VENTRICULAR IMPLANTABLE CARDIOVERTER DEFIBRILLATOR  (CRT-D);  Surgeon: Marinus Maw, MD;  Location: [facility] CATH LAB;  Service: Cardiovascular;  Laterality: N/A;    Cristela Felt, MS Dietetic Intern Pager: [phone]

## 2015-03-14 NOTE — Progress Notes (Signed)
Received pt report from Brittany,RN-ED. 

## 2015-03-14 NOTE — H&P (Signed)
  ICD Criteria  Current LVEF:35% ;Obtained < 1 month ago.  NYHA Functional Classification: Class III  Heart Failure History:  Yes, Duration of heart failure since onset is > 9 months  Non-Ischemic Dilated Cardiomyopathy History:  Yes, timeframe is > 9 months  Atrial Fibrillation/Atrial Flutter:  Yes, A-Fib/A-Flutter type: Paroxysmal.  Ventricular Tachycardia History:  No.  Cardiac Arrest History:  No  History of Syndromes with Risk of Sudden Death:  No.  Previous ICD:  No.  Electrophysiology Study: No.  Prior MI: No.  PPM: No.  OSA:  No  Patient Life Expectancy of >=1 year: Yes.  Anticoagulation Therapy:  Patient is on anticoagulation therapy, anticoagulation was NOT held prior to procedure.   Beta Blocker Therapy:  Yes.   Ace Inhibitor/ARB Therapy:  No, Reason not on Ace Inhibitor/ARB therapy:  renal insufficiency

## 2015-03-14 NOTE — ED Notes (Signed)
Dr Niu at bedside 

## 2015-03-14 NOTE — Progress Notes (Signed)
ANTICOAGULATION/ANTIBIOTIC CONSULT NOTE - Initial Consult  Pharmacy Consult for Coumadin and Vancocin/Maxipime Indication: atrial fibrillation and r/o PNA  Allergies  Allergen Reactions  . Levothyroxine Sodium Other (See Comments)    MUST TAKE BRAND NAME GENERIC DOESN'T WORK FOR PATIENT  . Chicken Protein     Does not like chicken or [place]  . Eggs Or Egg-Derived Products     Does not like eggs    Patient Measurements: Weight: 92.1kg  Vital Signs: Temp: 100.2 F (37.9 C) (03/25 0048) Temp Source: Rectal (03/25 0048) BP: 115/61 mmHg (03/25 0424) Pulse Rate: 60 (03/25 0424)  Labs:  Recent Labs  03/12/15 0329 03/13/15 0650 03/13/15 1030 03/13/15 2358 03/14/15 0036  HGB  --   --   --  10.0*  --   HCT  --   --   --  32.6*  --   PLT  --   --   --  227  --   LABPROT 24.7* 27.7*  --  30.4*  --   INR 2.21* 2.56*  --  2.88*  --   CREATININE 2.39* 2.28* 2.35* 2.75*  --   TROPONINI  --   --   --   --  0.22*    Estimated Creatinine Clearance: 27.4 mL/min (by C-G formula based on Cr of 2.75).   Medical History: Past Medical History  Diagnosis Date  . Gout   . HTN (hypertension)   . Hyperlipidemia   . Osteoarthritis   . History of CVA (cerebrovascular accident)     Left pontine infarct July 2004; changed from Plavix to Coumadin in 2004 per MD at [facility]  . GERD (gastroesophageal reflux disease)   . DJD (degenerative joint disease)   . BPH (benign prostatic hypertrophy)   . Peripheral vascular disease   . Bilateral leg ulcer     ACHILLES AREA--  NONHEALING  . CKD (chronic kidney disease) stage 3, GFR 30-59 ml/min   . CAD (coronary artery disease)     a. LHC 4/12: Mid LAD 25%, mid diagonal 30%, AV circumflex 40%, proximal OM 25%, distal RCA 40%  . Chronic combined systolic and diastolic heart failure     a. Echo 05/2013: EF 25-30%, diffuse HK, restrictive physiology, trivial AI, mild MR, moderate LAE, reduced RV systolic function, PASP 42  . LBBB (left bundle branch  block)   . PAF (paroxysmal atrial fibrillation)     a. amiodarone Rx started 05/2013;  b. chronic coumadin  . NICM (nonischemic cardiomyopathy)     a. EF 25-30%.  [name] Hx of cardiovascular stress test     Nuclear Stress Test (1/16): High risk stress nuclear study with a large, severe, partially reversible inferior and apical defect consistent with prior inferior and apical infarct; mild apical ischemia; severe LVE; study high risk due to reduced LV function.  EF 25% >> reviewed with Dr. Antoine Poche >> medical mgmt  . Hypothyroidism   . DM (diabetes mellitus), type 2     Assessment: 74yo male was discharged on 3/23 for pacer/defib placement, now c/o weakness, CXR shows mild left basilar airspace opacity, to begin IV ABX; also to continue Coumadin for Afib, current INR therapeutic.  Goal of Therapy:  INR 2-3 Vancomycin trough 15-20   Plan:  Rec'd cefepime 2g and vanc 1.5g IV in ED; will continue with vancomycin 1000mg  IV Q24H and cefepime 1g IV Q24H and monitor CBC, Cx, levels prn.  Will continue home Coumadin dose of 5mg  daily except 2.5mg  on Fridays and monitor INR  for dose adjustments.  Vernard Gambles, PharmD, BCPS  03/14/2015,4:35 AM

## 2015-03-14 NOTE — Progress Notes (Signed)
Subjective: Denies SOB  No CP  Weak in knees  Legs ache Objective: Filed Vitals:   03/14/15 0400 03/14/15 0424 03/14/15 0458 03/14/15 0459  BP: 115/61 115/61  135/73  Pulse: 55 60  62  Temp:    98.6 F (37 C)  TempSrc:    Oral  Resp: Height:    (1.88 m)   Weight:   210 lb 8.6 oz (95.5 kg)   SpO2: 99% 100%  95%   Weight change:   Intake/Output Summary (Last 24 hours) at 03/14/15 1113 Last data filed at 03/14/15 0929  Gross per 24 hour  Intake    500 ml  Output      0 ml  Net    500 ml    General: Alert, awake, oriented x3, in no acute distress Neck:  JVP is normal Heart: Regular rate and rhythm, without murmurs, rubs, gallops.  Chest:  ICD site clean and dry   Lungs: Clear to auscultation.  No rales or wheezes. Exemities:  Tr edema.   Neuro: Grossly intact, nonfocal.   Lab Results: Results for orders placed or performed during the hospital encounter of 03/13/15 (from the past 24 hour(s))  CBC with Differential/Platelet     Status: Abnormal   Collection Time: 03/13/15 11:58 PM  Result Value Ref Range   WBC 14.9 (H) 4.0 - 10.5 K/uL   RBC 3.79 (L) 4.22 - 5.81 MIL/uL   Hemoglobin 10.0 (L) 13.0 - 17.0 g/dL   HCT 16.1 (L) 09.6 - 04.5 %   MCV 86.0 78.0 - 100.0 fL   MCH 26.4 26.0 - 34.0 pg   MCHC 30.7 30.0 - 36.0 g/dL   RDW 40.9 (H) 81.1 - 91.4 %   Platelets 227 150 - 400 K/uL   Neutrophils Relative % 89 (H) 43 - 77 %   Neutro Abs 13.1 (H) 1.7 - 7.7 K/uL   Lymphocytes Relative 4 (L) 12 - 46 %   Lymphs Abs 0.6 (L) 0.7 - 4.0 K/uL   Monocytes Relative 7 3 - 12 %   Monocytes Absolute 1.1 (H) 0.1 - 1.0 K/uL   Eosinophils Relative 0 0 - 5 %   Eosinophils Absolute 0.0 0.0 - 0.7 K/uL   Basophils Relative 0 0 - 1 %   Basophils Absolute 0.0 0.0 - 0.1 K/uL  Comprehensive metabolic panel     Status: Abnormal   Collection Time: 03/13/15 11:58 PM  Result Value Ref Range   Sodium 145 135 - 145 mmol/L   Potassium 3.9 3.5 - 5.1 mmol/L   Chloride 110 96 - 112  mmol/L   CO2 28 19 - 32 mmol/L   Glucose, Bld 106 (H) 70 - 99 mg/dL   BUN 27 (H) 6 - 23 mg/dL   Creatinine, Ser 7.82 (H) 0.50 - 1.35 mg/dL   Calcium 8.8 8.4 - 95.6 mg/dL   Total Protein 5.8 (L) 6.0 - 8.3 g/dL   Albumin 3.1 (L) 3.5 - 5.2 g/dL   AST 17 0 - 37 U/L   ALT 7 0 - 53 U/L   Alkaline Phosphatase 70 39 - 117 U/L   Total Bilirubin 0.8 0.3 - 1.2 mg/dL   GFR calc non Af Amer 21 (L) >90 mL/min   GFR calc Af Amer 24 (L) >90 mL/min   Anion gap 7 5 - 15  Lipase, blood     Status: None   Collection Time: 03/13/15 11:58 PM  Result Value Ref Range  Lipase 14 11 - 59 U/L  Protime-INR     Status: Abnormal   Collection Time: 03/13/15 11:58 PM  Result Value Ref Range   Prothrombin Time 30.4 (H) 11.6 - 15.2 seconds   INR 2.88 (H) 0.00 - 1.49  Ethanol     Status: None   Collection Time: 03/13/15 11:58 PM  Result Value Ref Range   Alcohol, Ethyl (B) <5 0 - 9 mg/dL  Acetaminophen level     Status: Abnormal   Collection Time: 03/13/15 11:58 PM  Result Value Ref Range   Acetaminophen (Tylenol), Serum <10.0 (L) 10 - 30 ug/mL  Salicylate level     Status: None   Collection Time: 03/13/15 11:58 PM  Result Value Ref Range   Salicylate Lvl <4.0 2.8 - 20.0 mg/dL  I-Stat CG4 Lactic Acid, ED     Status: None   Collection Time: 03/14/15 12:31 AM  Result Value Ref Range   Lactic Acid, Venous 1.89 0.5 - 2.0 mmol/L  Troponin I     Status: Abnormal   Collection Time: 03/14/15 12:36 AM  Result Value Ref Range   Troponin I 0.22 (H) <0.031 ng/mL  Urinalysis, Routine w reflex microscopic     Status: None   Collection Time: 03/14/15  2:17 AM  Result Value Ref Range   Color, Urine YELLOW YELLOW   APPearance CLEAR CLEAR   Specific Gravity, Urine 1.017 1.005 - 1.030   pH 5.0 5.0 - 8.0   Glucose, UA NEGATIVE NEGATIVE mg/dL   Hgb urine dipstick NEGATIVE NEGATIVE   Bilirubin Urine NEGATIVE NEGATIVE   Ketones, ur NEGATIVE NEGATIVE mg/dL   Protein, ur NEGATIVE NEGATIVE mg/dL   Urobilinogen, UA 0.2  0.0 - 1.0 mg/dL   Nitrite NEGATIVE NEGATIVE   Leukocytes, UA NEGATIVE NEGATIVE  Urine rapid drug screen (hosp performed)     Status: Abnormal   Collection Time: 03/14/15  2:17 AM  Result Value Ref Range   Opiates NONE DETECTED NONE DETECTED   Cocaine NONE DETECTED NONE DETECTED   Benzodiazepines POSITIVE (A) NONE DETECTED   Amphetamines NONE DETECTED NONE DETECTED   Tetrahydrocannabinol NONE DETECTED NONE DETECTED   Barbiturates NONE DETECTED NONE DETECTED  CBG monitoring, ED     Status: Abnormal   Collection Time: 03/14/15  3:03 AM  Result Value Ref Range   Glucose-Capillary 103 (H) 70 - 99 mg/dL  Protime-INR     Status: Abnormal   Collection Time: 03/14/15  8:05 AM  Result Value Ref Range   Prothrombin Time 28.0 (H) 11.6 - 15.2 seconds   INR 2.60 (H) 0.00 - 1.49  Troponin I (q 6hr x 3)     Status: Abnormal   Collection Time: 03/14/15  8:05 AM  Result Value Ref Range   Troponin I 0.25 (H) <0.031 ng/mL  Brain natriuretic peptide     Status: Abnormal   Collection Time: 03/14/15  8:05 AM  Result Value Ref Range   B Natriuretic Peptide 315.7 (H) 0.0 - 100.0 pg/mL  Lactic acid, plasma     Status: None   Collection Time: 03/14/15  8:05 AM  Result Value Ref Range   Lactic Acid, Venous 1.2 0.5 - 2.0 mmol/L  Procalcitonin     Status: None   Collection Time: 03/14/15  8:05 AM  Result Value Ref Range   Procalcitonin 0.38 ng/mL  APTT     Status: Abnormal   Collection Time: 03/14/15  8:05 AM  Result Value Ref Range   aPTT 52 (H) 24 -  37 seconds  Glucose, capillary     Status: None   Collection Time: 03/14/15  8:07 AM  Result Value Ref Range   Glucose-Capillary 93 70 - 99 mg/dL    Studies/Results: Dg Chest 2 View  03/13/2015   CLINICAL DATA:  74 year old male status post pacemaker placement  EXAM: CHEST  2 VIEW  COMPARISON:  Preoperative chest x-ray 03/10/2015  FINDINGS: New left subclavian approach cardiac rhythm maintenance device. Leads project over the right atrium and right  ventricle. No evidence of pneumothorax or hemothorax. Stable cardiomegaly. Stable mediastinal contours. Atherosclerotic calcifications in the aorta. Pulmonary hyperexpansion and central bronchitic change. No pulmonary edema. No acute osseous abnormality.  IMPRESSION: 1. New left subclavian approach cardiac rhythm maintenance device with leads projecting over the right atrium and right ventricle. 2. No evidence of pneumothorax, hemothorax or other complication.   Electronically Signed   By: Malachy Moan M.D.   On: 03/13/2015 07:43    Medications: Reviewed   @PROBHOSP @  Impression:  1.Weakness  Patient just left hospital yesterday  Last night continued to feel weak, knees hurt, felt like knees going to give way.   He denied SOB  No CP  Came back to ER  T 100.  WBC 14.9  XCR with possible  multifocal pneumonia  Trop 0.22  He has been started on ABX .  He also received 500 IV bolus    Today he looks comfortable.  Afebrile    2.  Chronic systolic CHF.  Volume status is not bad  I would hold on further hydration.  Continue meds Watch I/O closely  Note torsemide restarted  At lower dose   (was sent home on 40 AM/20 PM)  Will need to follw and increase as needed   Patient has appt with B Bartle on 4/14 for possible epicardial  lead placmenet   No ACEI or aldactone with renal function.     3.  Increased trop.  Minimal elevation 0.22, 0.25  Continue to trend  4  PAF  CHADSVasc of 8  Agree with continuing anticoagulation  5  CKD   Stage III-IV  Follow   On exam  LOS: 0 days   Dietrich Pates 03/14/2015, 11:13 AM

## 2015-03-14 NOTE — Evaluation (Signed)
Clinical/Bedside Swallow Evaluation Patient Details  Name: Frank Mclean MRN: [id number] Date of Birth: [birth year]  Today's Date: 03/14/2015 Time: SLP Start Time (ACUTE ONLY): 1230 SLP Stop Time (ACUTE ONLY): 1250 SLP Time Calculation (min) (ACUTE ONLY): 20 min  Past Medical History:  Past Medical History  Diagnosis Date  . Gout   . HTN (hypertension)   . Hyperlipidemia   . Osteoarthritis   . History of CVA (cerebrovascular accident)     Left pontine infarct July 2004; changed from Plavix to Coumadin in 2004 per MD at [facility]  . GERD (gastroesophageal reflux disease)   . DJD (degenerative joint disease)   . BPH (benign prostatic hypertrophy)   . Peripheral vascular disease   . Bilateral leg ulcer     ACHILLES AREA--  NONHEALING  . CKD (chronic kidney disease) stage 3, GFR 30-59 ml/min   . CAD (coronary artery disease)     a. LHC 4/12: Mid LAD 25%, mid diagonal 30%, AV circumflex 40%, proximal OM 25%, distal RCA 40%  . Chronic combined systolic and diastolic heart failure     a. Echo 05/2013: EF 25-30%, diffuse HK, restrictive physiology, trivial AI, mild MR, moderate LAE, reduced RV systolic function, PASP 42  . LBBB (left bundle branch block)   . PAF (paroxysmal atrial fibrillation)     a. amiodarone Rx started 05/2013;  b. chronic coumadin  . NICM (nonischemic cardiomyopathy)     a. EF 25-30%.  [name] Hx of cardiovascular stress test     Nuclear Stress Test (1/16): High risk stress nuclear study with a large, severe, partially reversible inferior and apical defect consistent with prior inferior and apical infarct; mild apical ischemia; severe LVE; study high risk due to reduced LV function.  EF 25% >> reviewed with Dr. Antoine Poche >> medical mgmt  . Hypothyroidism   . DM (diabetes mellitus), type 2    Past Surgical History:  Past Surgical History  Procedure Laterality Date  . Cardiac catheterization  04-16-2011   [facility]    NON-OBSTRUCTIVE CAD. MILDLY ELEVATED PULMONARY  PRESSURES/ ELEVATED  END-DIASTOLIC PRESSURE  . Transthoracic echocardiogram  12-31-2010    MODERATE CONCENTRIC LVH/ SYSTOLIC FUNCTION SEVERELY REDUCED/ EF 25-30%/  SEVERE HYPOKINESIS OF ANTEROSEPTAL MYOCARDIUM  AND ENTIREAPICAL MYOCARDIUM /  MODERATE HYPOKINESIS OF LATERAL, INFEROLATERAL, INFERIOR,AND INFEROSEPTAL MYOCARIUM/  GRADE 3 DIASTOLIC DYSFUNCTION/ MILD MR  . Aortogram w/ bilateral lower extremitiy runoff  05-18-2013  DR FIELDS    LEFT LEG OCCLUDED PERONEAL AND ANTERIOR TIBIAL ARTERIES/ HIGH GRADE STENOIS 80% MIDDLE AND DISTAL THIRD OF POSTERIOR TIBIAL ARTERY/ RIGHT PERONEAL AND ANTERIOR TIBIAL ARTERY OCCLUDED/ 40% STENOSIS DISTALLY   . Abdominal aortagram N/A 05/18/2013    Procedure: ABDOMINAL Ronny Flurry;  Surgeon: Sherren Kerns, MD;  Location: [facility] CATH LAB;  Service: Cardiovascular;  Laterality: N/A;  . Lower extremity angiogram Bilateral 05/18/2013    Procedure: LOWER EXTREMITY ANGIOGRAM;  Surgeon: Sherren Kerns, MD;  Location: [facility] CATH LAB;  Service: Cardiovascular;  Laterality: Bilateral;  . Right heart catheterization N/A 02/13/2015    Procedure: RIGHT HEART CATH;  Surgeon: Laurey Morale, MD;  Location: [facility] CATH LAB;  Service: Cardiovascular;  Laterality: N/A;  . Bi-ventricular implantable cardioverter defibrillator N/A 03/12/2015    Procedure: BI-VENTRICULAR IMPLANTABLE CARDIOVERTER DEFIBRILLATOR  (CRT-D);  Surgeon: Marinus Maw, MD;  Location: [facility] CATH LAB;  Service: Cardiovascular;  Laterality: N/A;   HPI:  Frank Mclean is a 74 y.o. male with past medical history of hypertension, GERD, hyperlipidemia, gout, hypothyroidism, LBBB,  PVD, chronic kidney disease-stage III, coronary artery disease, combined systolic and diastolic congestive heart failure paroxysmal atrial fibrillation on Coumadin, diabetes mellitus, history of stroke, who presents with a generalized weakness.Patient was recently hospitalized from 3/21-3/24/16 for pacemaker placement. Pt states that he is not able to  ambulate on his own. He also has shortness of breath, nausea and vomiting. He vomited twice. In ED, patient was found to have multifocal pneumonia on chest x-ray. MD reports pna improved on 3/25. No history of being seen by SLP. Family reports pt with poor PO intake over several weeks, pt complains of difficutly swallowing.    Assessment / Plan / Recommendation Clinical Impression  Pt demonstrates signs concerning for possible decreased airway protection - cough/throat clear/wet vocal quality following thin liquids. Function improved with nectar trials and puree. Dentures not available for solid trials. Report also concerning for possible esophageal component. Recommend pt initiate a dys 2/nectar thick diet. SLP to f/u for possible MBS if needed after the weekend.     Aspiration Risk  Moderate    Diet Recommendation Dysphagia 2 (Fine chop);Nectar-thick liquid   Liquid Administration via: Cup;Straw Medication Administration: Whole meds with puree Supervision: Patient able to self feed Compensations: Slow rate;Small sips/bites Postural Changes and/or Swallow Maneuvers: Seated upright 90 degrees    Other  Recommendations Oral Care Recommendations: Oral care BID Other Recommendations: Order thickener from pharmacy   Follow Up Recommendations  Inpatient Rehab    Frequency and Duration min 2x/week  2 weeks   Pertinent Vitals/Pain NA    SLP Swallow Goals     Swallow Study Prior Functional Status       General HPI: Frank Mclean is a 74 y.o. male with past medical history of hypertension, GERD, hyperlipidemia, gout, hypothyroidism, LBBB, PVD, chronic kidney disease-stage III, coronary artery disease, combined systolic and diastolic congestive heart failure paroxysmal atrial fibrillation on Coumadin, diabetes mellitus, history of stroke, who presents with a generalized weakness.Patient was recently hospitalized from 3/21-3/24/16 for pacemaker placement. Pt states that he is not able to  ambulate on his own. He also has shortness of breath, nausea and vomiting. He vomited twice. In ED, patient was found to have multifocal pneumonia on chest x-ray. MD reports pna improved on 3/25. No history of being seen by SLP. Family reports pt with poor PO intake over several weeks, pt complains of difficutly swallowing.  Type of Study: Bedside swallow evaluation Previous Swallow Assessment: no Diet Prior to this Study: NPO Temperature Spikes Noted: No Respiratory Status: Room air History of Recent Intubation: No Behavior/Cognition: Alert;Cooperative;Pleasant mood Oral Cavity - Dentition: Edentulous;Dentures, not available Self-Feeding Abilities: Able to feed self Patient Positioning: Upright in bed Baseline Vocal Quality: Hoarse Volitional Cough: Strong Volitional Swallow: Able to elicit    Oral/Motor/Sensory Function Overall Oral Motor/Sensory Function: Appears within functional limits for tasks assessed   Ice Chips     Thin Liquid Thin Liquid: Impaired Presentation: Cup;Straw;Self Fed Pharyngeal  Phase Impairments: Throat Clearing - Immediate;Cough - Immediate    Nectar Thick Nectar Thick Liquid: Within functional limits   Honey Thick Honey Thick Liquid: Not tested   Puree Puree: Within functional limits   Solid   GO    Solid: Not tested      Harlon Ditty, MA CCC-SLP [phone]  Claudine Mouton 03/14/2015,2:20 PM

## 2015-03-14 NOTE — Progress Notes (Signed)
Utilization review completed. Shahidah Nesbitt, RN, BSN.q 

## 2015-03-15 LAB — CBC
HCT: 29.4 % — ABNORMAL LOW (ref 39.0–52.0)
HEMOGLOBIN: 8.8 g/dL — AB (ref 13.0–17.0)
MCH: 25.9 pg — AB (ref 26.0–34.0)
MCHC: 29.9 g/dL — AB (ref 30.0–36.0)
MCV: 86.5 fL (ref 78.0–100.0)
Platelets: 198 10*3/uL (ref 150–400)
RBC: 3.4 MIL/uL — ABNORMAL LOW (ref 4.22–5.81)
RDW: 15.8 % — AB (ref 11.5–15.5)
WBC: 12.7 10*3/uL — ABNORMAL HIGH (ref 4.0–10.5)

## 2015-03-15 LAB — PROTIME-INR
INR: 2.73 — ABNORMAL HIGH (ref 0.00–1.49)
Prothrombin Time: 29.1 seconds — ABNORMAL HIGH (ref 11.6–15.2)

## 2015-03-15 LAB — TYPE AND SCREEN
ABO/RH(D): O POS
Antibody Screen: NEGATIVE

## 2015-03-15 LAB — BASIC METABOLIC PANEL
ANION GAP: 8 (ref 5–15)
BUN: 31 mg/dL — AB (ref 6–23)
CALCIUM: 8.6 mg/dL (ref 8.4–10.5)
CO2: 27 mmol/L (ref 19–32)
CREATININE: 2.71 mg/dL — AB (ref 0.50–1.35)
Chloride: 112 mmol/L (ref 96–112)
GFR calc Af Amer: 25 mL/min — ABNORMAL LOW (ref >90)
GFR, EST NON AFRICAN AMERICAN: 21 mL/min — AB (ref >90)
GLUCOSE: 151 mg/dL — AB (ref 70–99)
Potassium: 3.6 mmol/L (ref 3.5–5.1)
Sodium: 147 mmol/L — ABNORMAL HIGH (ref 135–145)

## 2015-03-15 LAB — UREA NITROGEN, URINE: Urea Nitrogen, Ur: 655 mg/dL

## 2015-03-15 LAB — ABO/RH: ABO/RH(D): O POS

## 2015-03-15 LAB — GLUCOSE, CAPILLARY
GLUCOSE-CAPILLARY: 183 mg/dL — AB (ref 70–99)
Glucose-Capillary: 152 mg/dL — ABNORMAL HIGH (ref 70–99)
Glucose-Capillary: 229 mg/dL — ABNORMAL HIGH (ref 70–99)
Glucose-Capillary: 300 mg/dL — ABNORMAL HIGH (ref 70–99)

## 2015-03-15 LAB — IRON AND TIBC: UIBC: 156 ug/dL (ref 125–400)

## 2015-03-15 LAB — FOLATE: Folate: 10.1 ng/mL

## 2015-03-15 LAB — FERRITIN: Ferritin: 333 ng/mL — ABNORMAL HIGH (ref 22–322)

## 2015-03-15 LAB — URIC ACID: Uric Acid, Serum: 9.7 mg/dL — ABNORMAL HIGH (ref 4.0–7.8)

## 2015-03-15 LAB — RETICULOCYTES
RBC.: 3.46 MIL/uL — ABNORMAL LOW (ref 4.22–5.81)
RETIC CT PCT: 0.5 % (ref 0.4–3.1)
Retic Count, Absolute: 17.3 10*3/uL — ABNORMAL LOW (ref 19.0–186.0)

## 2015-03-15 LAB — VITAMIN B12: Vitamin B-12: 733 pg/mL (ref 211–911)

## 2015-03-15 MED ORDER — METHYLPREDNISOLONE 4 MG PO KIT
8.0000 mg | PACK | Freq: Every morning | ORAL | Status: AC
  Administered 2015-03-15: 8 mg via ORAL
  Filled 2015-03-15: qty 21

## 2015-03-15 MED ORDER — DEXTROSE 5 % IV SOLN
INTRAVENOUS | Status: DC
  Administered 2015-03-15: 1 mL via INTRAVENOUS

## 2015-03-15 MED ORDER — METHYLPREDNISOLONE 4 MG PO KIT
4.0000 mg | PACK | Freq: Three times a day (TID) | ORAL | Status: AC
  Administered 2015-03-16 (×3): 4 mg via ORAL

## 2015-03-15 MED ORDER — METHYLPREDNISOLONE 4 MG PO KIT
4.0000 mg | PACK | ORAL | Status: AC
  Administered 2015-03-15: 4 mg via ORAL

## 2015-03-15 MED ORDER — PANTOPRAZOLE SODIUM 40 MG PO TBEC
40.0000 mg | DELAYED_RELEASE_TABLET | Freq: Two times a day (BID) | ORAL | Status: DC
  Administered 2015-03-15 – 2015-03-17 (×5): 40 mg via ORAL
  Filled 2015-03-15 (×4): qty 1

## 2015-03-15 MED ORDER — TORSEMIDE 10 MG PO TABS
10.0000 mg | ORAL_TABLET | Freq: Every day | ORAL | Status: DC
  Administered 2015-03-16 – 2015-03-17 (×2): 10 mg via ORAL
  Filled 2015-03-15 (×2): qty 1

## 2015-03-15 MED ORDER — METHYLPREDNISOLONE 4 MG PO KIT
8.0000 mg | PACK | Freq: Every evening | ORAL | Status: AC
  Administered 2015-03-16: 8 mg via ORAL

## 2015-03-15 MED ORDER — METHYLPREDNISOLONE 4 MG PO KIT
4.0000 mg | PACK | Freq: Four times a day (QID) | ORAL | Status: DC
  Administered 2015-03-17 (×3): 4 mg via ORAL

## 2015-03-15 MED ORDER — METHYLPREDNISOLONE 4 MG PO KIT
8.0000 mg | PACK | Freq: Every evening | ORAL | Status: AC
  Administered 2015-03-15: 8 mg via ORAL

## 2015-03-15 NOTE — Progress Notes (Addendum)
Dr. Thedore Mins made aware of patient's bladder scan result of >585mls. Patient denies sensation of having to urinate, and unable to urinate at this time. Order for in and out catheter once then recheck bladder scan in 6 hours.   11:46 AM in and out catheter resutled in of urine output.   4:58 PM pt still has not voided. Bladder scan showed 266 ml. Dr. Thedore Mins made aware. Received telephone order to insert urethral catheter for acute urinary retention.

## 2015-03-15 NOTE — Progress Notes (Signed)
ANTICOAGULATION CONSULT NOTE - Follow Up Consult  Pharmacy Consult for coumadin Indication: atrial fibrillation  Allergies  Allergen Reactions  . Levothyroxine Sodium Other (See Comments)    MUST TAKE BRAND NAME GENERIC DOESN'T WORK FOR PATIENT  . Chicken Protein     Does not like chicken or [place]  . Eggs Or Egg-Derived Products     Does not like eggs    Patient Measurements: Height: 6\' 2"  (188 cm) Weight: 210 lb 8.6 oz (95.5 kg) IBW/kg (Calculated) : 82.2 Heparin Dosing Weight:   Vital Signs: Temp: 99.3 F (37.4 C) (03/26 0612) Temp Source: Oral (03/26 0612) BP: 126/61 mmHg (03/26 0612) Pulse Rate: 64 (03/26 0612)  Labs:  Recent Labs  03/13/15 1030 03/13/15 2358  03/14/15 0805 03/14/15 1435 03/14/15 2035 03/15/15 0553  HGB  --  10.0*  --   --   --   --  8.8*  HCT  --  32.6*  --   --   --   --  29.4*  PLT  --  227  --   --   --   --  198  APTT  --   --   --  52*  --   --   --   LABPROT  --  30.4*  --  28.0*  --   --  29.1*  INR  --  2.88*  --  2.60*  --   --  2.73*  CREATININE 2.35* 2.75*  --   --   --   --  2.71*  TROPONINI  --   --   < > 0.25* 0.33* 0.30*  --   < > = values in this interval not displayed.  Estimated Creatinine Clearance: 27.4 mL/min (by C-G formula based on Cr of 2.71).   Medications:  Scheduled:  . amLODipine  10 mg Oral Daily  . atorvastatin  20 mg Oral Daily  . budesonide-formoterol  2 puff Inhalation BID  . carvedilol  12.5 mg Oral BID WC  . ceFEPime (MAXIPIME) IV  1 g Intravenous Q24H  . cholecalciferol  1,000 Units Oral Daily  . escitalopram  5 mg Oral QHS  . feeding supplement (ENSURE ENLIVE)  237 mL Oral BID BM  . ferrous sulfate  325 mg Oral Q breakfast  . hydrALAZINE  100 mg Oral TID  . insulin aspart  0-9 Units Subcutaneous TID WC  . isosorbide mononitrate  90 mg Oral BID  . levothyroxine  200 mcg Oral QAC breakfast  . methylPREDNISolone  4 mg Oral PC lunch  . methylPREDNISolone  4 mg Oral PC supper  . [START ON  03/16/2015] methylPREDNISolone  4 mg Oral 3 x daily with food  . [START ON 03/17/2015] methylPREDNISolone  4 mg Oral 4X daily taper  . methylPREDNISolone  8 mg Oral AC breakfast  . methylPREDNISolone  8 mg Oral Nightly  . [START ON 03/16/2015] methylPREDNISolone  8 mg Oral Nightly  . multivitamin with minerals  1 tablet Oral Daily  . pantoprazole  40 mg Oral BID  . [START ON 03/16/2015] torsemide  10 mg Oral Daily  . vancomycin  1,000 mg Intravenous Q24H  . cyanocobalamin  1,000 mcg Oral Daily  . warfarin  2.5 mg Oral Q Fri-1800  . warfarin  5 mg Oral Once per day on Sun Mon Tue Wed Thu Sat  . Warfarin - Pharmacist Dosing Inpatient   Does not apply q1800   Infusions:  . dextrose      Assessment: 73  yo male with afib is currently on therapeutic coumadin.  INR today is 2.73.  Goal of Therapy:  INR 2-3 Monitor platelets by anticoagulation protocol: Yes   Plan:  Will continue home Coumadin dose of  daily except 2.5mg  on Fridays and monitor INR for dose adjustments  Rally Ouch, Tsz-Yin 03/15/2015,10:51 AM

## 2015-03-15 NOTE — Progress Notes (Signed)
Patient Demographics  Frank Mclean, is a 74 y.o. male, DOB - [birth year], NFA:[id number]  Admit date - 03/13/2015   Admitting Physician Lorretta Harp, MD  Outpatient Primary MD for the patient is [name], MD  LOS - 1   Chief Complaint  Patient presents with  . Weakness        Subjective:   Frank Mclean today has, No headache, No chest pain, No abdominal pain - No Nausea, No new weakness tingling or numbness, Improved Cough - SOB.    Assessment & Plan    1.HCAP - improved, continue empiric antibiotics follow cultures, continue supportive care with oxygen and nebulizer treatments. Question aspiration, seen by speech currently on dysphagia diet 2 - Necter thick liquid with feeding assistance and aspiration precautions.   2. Chronic systolic heart failure EF 30-35%, has AICD. Discharged by cardiology yesterday. Currently compensated. Continue combination of Coreg, hydralazine, Imdur, will resume low-dose diuretic from 03-16-15. No ACE/ARB or Aldactone due to CK D.    3. CAD. Has left bundle branch block. Mildly elevated troponin likely due to #1 above. Cardiology consulted. Chest pain-free. Further management per cardiology. Not on aspirin since he is on Coumadin. Continue beta blocker and statin for secondary prevention.    4. Paroxysmal atrial fibrillation. CHADS2VASC score is 9 - on Coumadin and Coreg continue. Pharmacy monitoring INR.  Lab Results  Component Value Date   INR 2.73* 03/15/2015   INR 2.60* 03/14/2015   INR 2.88* 03/13/2015   PROTIME 15.7 05/28/2009     5. 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 Monitor closely.   6. Bilateral dull ongoing knee pain. Patient says this has been ongoing for the last 3-4 weeks on an intermittent basis. Remote history of gout, check uric  acid level, exam is unremarkable, we will do a Medrol Dosepak trial and monitor.   7. Hypernatremia. Mildly dehydrated, skip today's diuretic, gentle D5W. Repeat BMP in the morning.   8. Anemia. Likely due to dilution. Check anemia panel, type screen, repeat CBC in the morning.     Code Status: Full  Family Communication: None  Disposition Plan: TBD   Procedures     Consults  Cards, Speech   Medications  Scheduled Meds: . amLODipine  10 mg Oral Daily  . atorvastatin  20 mg Oral Daily  . budesonide-formoterol  2 puff Inhalation BID  . carvedilol  12.5 mg Oral BID WC  . ceFEPime (MAXIPIME) IV  1 g Intravenous Q24H  . cholecalciferol  1,000 Units Oral Daily  . escitalopram  5 mg Oral QHS  . feeding supplement (ENSURE ENLIVE)  237 mL Oral BID BM  . ferrous sulfate  325 mg Oral Q breakfast  . hydrALAZINE  100 mg Oral TID  . insulin aspart  0-9 Units Subcutaneous TID WC  . isosorbide mononitrate  90 mg Oral BID  . levothyroxine  200 mcg Oral QAC breakfast  . methylPREDNISolone  4 mg Oral PC lunch  . methylPREDNISolone  4 mg Oral PC supper  . [START ON 03/16/2015] methylPREDNISolone  4 mg Oral 3 x daily with food  . [START ON 03/17/2015] methylPREDNISolone  4 mg Oral 4X daily taper  . methylPREDNISolone  8 mg Oral AC breakfast  .  methylPREDNISolone  8 mg Oral Nightly  . [START ON 03/16/2015] methylPREDNISolone  8 mg Oral Nightly  . multivitamin with minerals  1 tablet Oral Daily  . pantoprazole  40 mg Oral BID  . [START ON 03/16/2015] torsemide  10 mg Oral Daily  . vancomycin  1,000 mg Intravenous Q24H  . cyanocobalamin  1,000 mcg Oral Daily  . warfarin  2.5 mg Oral Q Fri-1800  . warfarin  5 mg Oral Once per day on Sun Mon Tue Wed Thu Sat  . Warfarin - Pharmacist Dosing Inpatient   Does not apply q1800   Continuous Infusions: . dextrose     PRN Meds:.acetaminophen, HYDROcodone-acetaminophen, hydrOXYzine, RESOURCE THICKENUP CLEAR  DVT Prophylaxis  Coumadin  Lab  Results  Component Value Date   INR 2.73* 03/15/2015   INR 2.60* 03/14/2015   INR 2.88* 03/13/2015   PROTIME 15.7 05/28/2009     Lab Results  Component Value Date   PLT 198 03/15/2015    Antibiotics     Anti-infectives    Start     Dose/Rate Route Frequency Ordered Stop   03/15/15 0500  ceFEPIme (MAXIPIME) 1 g in dextrose 5 % 50 mL IVPB     1 g 100 mL/hr over 30 Minutes Intravenous Every 24 hours 03/14/15 0455     03/15/15 0400  vancomycin (VANCOCIN) IVPB 1000 mg/200 mL premix     1,000 mg 200 mL/hr over 60 Minutes Intravenous Every 24 hours 03/14/15 0455     03/14/15 1000  ceFEPIme (MAXIPIME) 2 g in dextrose 5 % 50 mL IVPB  Status:  Discontinued     2 g 100 mL/hr over 30 Minutes Intravenous Every 12 hours 03/14/15 0455 03/14/15 0457   03/14/15 0315  vancomycin (VANCOCIN) 1,500 mg in sodium chloride 0.9 % 500 mL IVPB     1,500 mg 250 mL/hr over 120 Minutes Intravenous  Once 03/14/15 0240 03/14/15 0532   03/14/15 0245  ceFEPIme (MAXIPIME) 2 g in dextrose 5 % 50 mL IVPB     2 g 100 mL/hr over 30 Minutes Intravenous  Once 03/14/15 0240 03/14/15 0332          Objective:   Filed Vitals:   03/14/15 1700 03/14/15 2143 03/15/15 0612 03/15/15 0825  BP: 128/60 103/54 126/61   Pulse: 70 63 64   Temp:  99.1 F (37.3 C) 99.3 F (37.4 C)   TempSrc:  Oral Oral   Resp:  20 13   Height:      Weight:      SpO2:  93% 95% 95%    Wt Readings from Last 3 Encounters:  03/14/15 95.5 kg (210 lb 8.6 oz)  03/13/15 92.08 kg (203 lb)  03/05/15 101.969 kg (224 lb 12.8 oz)     Intake/Output Summary (Last 24 hours) at 03/15/15 0926 Last data filed at 03/15/15 1610  Gross per 24 hour  Intake 1610.83 ml  Output    600 ml  Net 1010.83 ml     Physical Exam  Awake Alert, Oriented X 3, No new F.N deficits, Normal affect [place].AT,PERRAL Supple Neck,No JVD, No cervical lymphadenopathy appriciated.  Symmetrical Chest wall movement, Good air movement bilaterally, CTAB RRR,No  Gallops,Rubs or new Murmurs, No Parasternal Heave +ve B.Sounds, Abd Soft, No tenderness, No organomegaly appriciated, No rebound - guarding or rigidity. No Cyanosis, Clubbing or edema, No new Rash or bruise      Data Review   Micro Results Recent Results (from the past 240 hour(s))  Culture, blood (  routine x 2)     Status: None (Preliminary result)   Collection Time: 03/13/15 11:47 PM  Result Value Ref Range Status   Specimen Description BLOOD LEFT HAND  Final   Special Requests BOTTLES DRAWN AEROBIC AND ANAEROBIC 5CC EACH  Final   Culture   Final           BLOOD CULTURE RECEIVED NO GROWTH TO DATE CULTURE WILL BE HELD FOR 5 DAYS BEFORE ISSUING A FINAL NEGATIVE REPORT Performed at [facility]    Report Status PENDING  Incomplete  Culture, blood (routine x 2)     Status: None (Preliminary result)   Collection Time: 03/13/15 11:55 PM  Result Value Ref Range Status   Specimen Description BLOOD LEFT WRIST  Final   Special Requests BOTTLES DRAWN AEROBIC AND ANAEROBIC 5CC EACH  Final   Culture   Final           BLOOD CULTURE RECEIVED NO GROWTH TO DATE CULTURE WILL BE HELD FOR 5 DAYS BEFORE ISSUING A FINAL NEGATIVE REPORT Performed at [facility]    Report Status PENDING  Incomplete    Radiology Reports Dg Chest 2 View  03/14/2015   CLINICAL DATA:  Acute onset of generalized weakness and shortness of breath. Initial encounter.  EXAM: CHEST  2 VIEW  COMPARISON:  Chest radiograph performed 03/13/2015  FINDINGS: The lungs are well-aerated. Mild vascular congestion is noted. Mild left basilar opacity may reflect atelectasis or possibly minimal interstitial edema. There is no evidence of pleural effusion or pneumothorax.  The heart is enlarged. A pacemaker/AICD is noted at the left chest wall, with leads ending at the right atrium and right ventricle. No acute osseous abnormalities are seen.  IMPRESSION: Mild vascular congestion and cardiomegaly noted. Mild left basilar airspace  opacity may reflect atelectasis or possibly minimal interstitial edema.   Electronically Signed   By: Roanna Raider M.D.   On: 03/14/2015 00:28   Dg Chest 2 View  03/13/2015   CLINICAL DATA:  74 year old male status post pacemaker placement  EXAM: CHEST  2 VIEW  COMPARISON:  Preoperative chest x-ray 03/10/2015  FINDINGS: New left subclavian approach cardiac rhythm maintenance device. Leads project over the right atrium and right ventricle. No evidence of pneumothorax or hemothorax. Stable cardiomegaly. Stable mediastinal contours. Atherosclerotic calcifications in the aorta. Pulmonary hyperexpansion and central bronchitic change. No pulmonary edema. No acute osseous abnormality.  IMPRESSION: 1. New left subclavian approach cardiac rhythm maintenance device with leads projecting over the right atrium and right ventricle. 2. No evidence of pneumothorax, hemothorax or other complication.   Electronically Signed   By: Malachy Moan M.D.   On: 03/13/2015 07:43   Dg Chest 2 View  03/10/2015   CLINICAL DATA:  Acute onset of shortness of breath. Initial encounter.  EXAM: CHEST  2 VIEW  COMPARISON:  Chest radiograph performed 11/27/2013  FINDINGS: The lungs are well-aerated. Vascular congestion is noted, with bilateral central airspace opacities and a small left pleural effusion, likely reflecting mild recurrent pulmonary edema. No pneumothorax is seen.  The heart is enlarged.  No acute osseous abnormalities are seen.  IMPRESSION: Vascular congestion and cardiomegaly, with bilateral central airspace opacities and a small left pleural effusion, likely reflecting mild recurrent pulmonary edema.   Electronically Signed   By: Roanna Raider M.D.   On: 03/10/2015 22:54   Ct Head Wo Contrast  03/14/2015   CLINICAL DATA:  Acute onset of generalized weakness. Initial encounter.  EXAM: CT HEAD WITHOUT  CONTRAST  TECHNIQUE: Contiguous axial images were obtained from the base of the skull through the vertex without  intravenous contrast.  COMPARISON:  CT of the head performed 09/13/2013  FINDINGS: There is no evidence of acute infarction, mass lesion, or intra- or extra-axial hemorrhage on CT.  Prominence of the ventricles and sulci reflects mild to moderate cortical volume loss. Cerebellar atrophy is noted. Scattered periventricular and subcortical white matter change likely reflects small vessel ischemic microangiopathy. A prominent chronic lacunar infarct is noted at the right thalamus, and a chronic lacunar infarct is seen at the left basal ganglia.  The brainstem and fourth ventricle are within normal limits. The cerebral hemispheres demonstrate grossly normal gray-white differentiation. No mass effect or midline shift is seen.  There is no evidence of fracture; visualized osseous structures are unremarkable in appearance. The orbits are within normal limits. The paranasal sinuses and mastoid air cells are well-aerated. No significant soft tissue abnormalities are seen.  IMPRESSION: 1. No acute intracranial pathology seen on CT. 2. Mild to moderate cortical volume loss and scattered small vessel ischemic microangiopathy. 3. 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   Electronically Signed   By: Roanna Raider M.D.   On: 03/14/2015 00:48   Ct Chest Wo Contrast  03/14/2015   CLINICAL DATA:  Acute onset of shortness of breath and generalized weakness. Initial encounter.  EXAM: CT CHEST WITHOUT CONTRAST  TECHNIQUE: Multidetector CT imaging of the chest was performed following the standard protocol without IV contrast.  COMPARISON:  Chest radiograph performed earlier today at 12:03 a.m.  FINDINGS: Patchy bibasilar airspace opacities are noted at the lung bases, in a peribronchovascular distribution, and at the left lingula. This is concerning for pneumonia. The lungs are otherwise clear. No pleural effusion or pneumothorax is seen. No masses are identified.  Scattered coronary artery  calcifications are seen. The heart is mildly enlarged. A pacemaker is noted at the left chest wall, with leads ending at the right atrium and right ventricle. No pericardial effusion is identified. No mediastinal lymphadenopathy is seen. Scattered calcification is noted along the aortic arch. The great vessels are grossly unremarkable in appearance. The thyroid gland is within normal limits. No axillary lymphadenopathy is seen.  The visualized portions of the liver and spleen are unremarkable, aside from a calcified granuloma at the right hepatic lobe. Mildly increased attenuation within the gallbladder is nonspecific; no definite stones are seen. The visualized portions of the pancreas and adrenal glands are within normal limits. A 4.6 cm left renal cyst is noted. Mild calcification is noted along the proximal abdominal aorta  No acute osseous abnormalities are seen. Mild degenerative change is noted at the lower cervical spine.  IMPRESSION: 1. Patchy bibasilar airspace opacities at the lung bases, in a peribronchovascular distribution, and at the left lingula. This is concerning for multifocal pneumonia. 2. Scattered coronary artery calcifications seen. Mild cardiomegaly noted. 3. Left renal cyst noted. 4. Mild calcification along the proximal abdominal aorta.   Electronically Signed   By: Roanna Raider M.D.   On: 03/14/2015 02:32     CBC  Recent Labs Lab 03/10/15 2118 03/13/15 2358 03/15/15 0553  WBC 9.5 14.9* 12.7*  HGB 10.7* 10.0* 8.8*  HCT 35.4* 32.6* 29.4*  PLT 256 227 198  MCV 87.4 86.0 86.5  MCH 26.4 26.4 25.9*  MCHC 30.2 30.7 29.9*  RDW 15.8* 15.9* 15.8*  LYMPHSABS 0.7 0.6*  --   MONOABS 0.5 1.1*  --  EOSABS 0.1 0.0  --   BASOSABS 0.0 0.0  --     Chemistries   Recent Labs Lab 03/12/15 0329 03/13/15 0650 03/13/15 1030 03/13/15 2358 03/15/15 0553  NA 146* 145 147* 145 147*  K 4.3 3.8 3.6 3.9 3.6  CL 109 110 110 110 112  CO2 GLUCOSE 89 120* 114* 106* 151*   BUN 27* 31*  CREATININE 2.39* 2.28* 2.35* 2.75* 2.71*  CALCIUM 8.9 8.9 9.0 8.8 8.6  AST  --   --   --  17  --   ALT  --   --   --  7  --   ALKPHOS  --   --   --  70  --   BILITOT  --   --   --  0.8  --    ------------------------------------------------------------------------------------------------------------------ estimated creatinine clearance is 27.4 mL/min (by C-G formula based on Cr of 2.71). ------------------------------------------------------------------------------------------------------------------ No results for input(s): HGBA1C in the last 72 hours. ------------------------------------------------------------------------------------------------------------------ No results for input(s): CHOL, HDL, LDLCALC, TRIG, CHOLHDL, LDLDIRECT in the last 72 hours. ------------------------------------------------------------------------------------------------------------------ No results for input(s): TSH, T4TOTAL, T3FREE, THYROIDAB in the last 72 hours.  Invalid input(s): FREET3 ------------------------------------------------------------------------------------------------------------------ No results for input(s): VITAMINB12, FOLATE, FERRITIN, TIBC, IRON, RETICCTPCT in the last 72 hours.  Coagulation profile  Recent Labs Lab 03/12/15 0329 03/13/15 0650 03/13/15 2358 03/14/15 0805 03/15/15 0553  INR 2.21* 2.56* 2.88* 2.60* 2.73*    No results for input(s): DDIMER in the last 72 hours.  Cardiac Enzymes  Recent Labs Lab 03/14/15 0805 03/14/15 1435 03/14/15 2035  TROPONINI 0.25* 0.33* 0.30*   ------------------------------------------------------------------------------------------------------------------ Invalid input(s): POCBNP     Time Spent in minutes   35   SINGH,PRASHANT K M.D on 03/15/2015 at 9:26 AM  Between 7am to 7pm - Pager - [phone]  After 7pm go to [email or web address] - password [facility]  [facility]   Office  [phone]

## 2015-03-15 NOTE — Progress Notes (Signed)
Dr. Thedore Mins made aware that Flu Panel was negative. Verbal order to d/c droplet precautions. MD made aware respiratory panel is still pending, Dr. Thedore Mins verbal order okay to still d/c precautions.

## 2015-03-15 NOTE — Evaluation (Signed)
Occupational Therapy Evaluation Patient Details Name: Frank Mclean MRN: [id number] DOB: 23-Apr-1941 Today's Date: 03/15/2015    History of Present Illness 74 yo male with LBBB was fitted with defib with incomplete insertion, revision next week.  Due to outpatient status was forced to go home after procedure instead of SNF for rehab and fell at home necessitating return to hospital same day.  Found to now have PNA.   Clinical Impression   Pt admitted with above. He demonstrates the below listed deficits and will benefit from continued OT to maximize safety and independence with BADLs.  Pt presents to OT with generalized weakness, acute pain, and impaired balance.  Currently, he requires total A - max A with LB ADLs.  Unable to stand or transfer this pm due to Rt LE pain.  Moves to EOB with mod A.   Feel he would benefit from CIR to allow him to return home with family.       Follow Up Recommendations  CIR    Equipment Recommendations  3 in 1 bedside comode    Recommendations for Other Services Rehab consult     Precautions / Restrictions Precautions Precautions: Fall;ICD/Pacemaker      Mobility Bed Mobility Overal bed mobility: Needs Assistance Bed Mobility: Supine to Sit;Sit to Supine     Supine to sit: Mod assist Sit to supine: Min assist   General bed mobility comments: Assist for bil. LEs   Transfers                 General transfer comment: Unable to move sit to stand with +1 assist     Balance Overall balance assessment: Needs assistance Sitting-balance support: Feet supported Sitting balance-Leahy Scale: Fair Sitting balance - Comments: Pt with mild posterior bias, but can correct                                     ADL Overall ADL's : Needs assistance/impaired Eating/Feeding: Independent;Bed level   Grooming: Wash/dry hands;Wash/dry face;Oral care;Brushing hair;Set up;Sitting   Upper Body Bathing: Set up;Supervision/  safety;Sitting;Bed level   Lower Body Bathing: Maximal assistance;Bed level   Upper Body Dressing : Minimal assistance;Sitting   Lower Body Dressing: Total assistance;Bed level   Toilet Transfer: Total assistance (unable to attempt safely with +1 assist )   Toileting- Clothing Manipulation and Hygiene: Total assistance;Bed level       Functional mobility during ADLs: Total assistance General ADL Comments: Pt moved to EOB.  Pain 9/10 Rt knee.  Unable to move into standing.  Unable to access bil. LEs      Vision     Perception     Praxis      Pertinent Vitals/Pain Pain Assessment: 0-10 Pain Score: 9  Pain Location: Rt LE (pt thinks he has gout  Pain Descriptors / Indicators: Constant;Grimacing;Guarding Pain Intervention(s): Limited activity within patient's tolerance;Repositioned     Hand Dominance Right   Extremity/Trunk Assessment Upper Extremity Assessment Upper Extremity Assessment: Generalized weakness;LUE deficits/detail LUE: Unable to fully assess due to immobilization (Pt with pacemaker precautions )   Lower Extremity Assessment Lower Extremity Assessment: Defer to PT evaluation   Cervical / Trunk Assessment Cervical / Trunk Assessment: Normal   Communication Communication Communication: No difficulties   Cognition Arousal/Alertness: Awake/alert Behavior During Therapy: WFL for tasks assessed/performed Overall Cognitive Status: Within Functional Limits for tasks assessed  General Comments       Exercises       Shoulder Instructions      Home Living Family/patient expects to be discharged to:: Private residence Living Arrangements: Spouse/significant other Available Help at Discharge: Family Type of Home: House Home Access: Stairs to enter Secretary/administrator of Steps: 5 Entrance Stairs-Rails: Right;Left;Can reach both Home Layout: Multi-level Alternate Level Stairs-Number of Steps: 13 Alternate Level  Stairs-Rails: Left           Home Equipment: Walker - 2 wheels;Cane - single point;Shower seat          Prior Functioning/Environment Level of Independence: Needs assistance  Gait / Transfers Assistance Needed: Pt reports he ambulated mod I ADL's / Homemaking Assistance Needed: Pt reports he jsut started showering again after bil. leg injury ~17 mos ago. He states wife has to assist him with socks, but he is able to perform bathing and dressing, "but it takes some effort"        OT Diagnosis: Generalized weakness;Acute pain   OT Problem List: Decreased strength;Decreased activity tolerance;Impaired balance (sitting and/or standing);Decreased safety awareness;Decreased knowledge of use of DME or AE;Pain   OT Treatment/Interventions: Self-care/ADL training;DME and/or AE instruction;Therapeutic activities;Patient/family education;Balance training    OT Goals(Current goals can be found in the care plan section) Acute Rehab OT Goals Patient Stated Goal: To regain independence  OT Goal Formulation: With patient Time For Goal Achievement: 03/22/15 Potential to Achieve Goals: Good ADL Goals Pt Will Perform Lower Body Bathing: with mod assist;with adaptive equipment;sit to/from stand Pt Will Perform Lower Body Dressing: with mod assist;with adaptive equipment;sit to/from stand Pt Will Transfer to Toilet: with mod assist;stand pivot transfer;bedside commode  OT Frequency: Min 2X/week   Barriers to D/C: Decreased caregiver support          Co-evaluation              End of Session Nurse Communication: Mobility status  Activity Tolerance: Patient tolerated treatment well Patient left: in bed;with call bell/phone within reach   Time: 4098-1191 OT Time Calculation (min): 33 min Charges:  OT General Charges $OT Visit: 1 Procedure OT Evaluation $Initial OT Evaluation Tier I: 1 Procedure OT Treatments $Therapeutic Activity: 8-22 mins G-Codes:    Frank Mclean  M 2015-03-22, 3:27 PM

## 2015-03-16 LAB — BASIC METABOLIC PANEL
Anion gap: 10 (ref 5–15)
BUN: 33 mg/dL — AB (ref 6–23)
CALCIUM: 8.7 mg/dL (ref 8.4–10.5)
CHLORIDE: 108 mmol/L (ref 96–112)
CO2: 23 mmol/L (ref 19–32)
CREATININE: 2.74 mg/dL — AB (ref 0.50–1.35)
GFR calc Af Amer: 25 mL/min — ABNORMAL LOW (ref >90)
GFR, EST NON AFRICAN AMERICAN: 21 mL/min — AB (ref >90)
Glucose, Bld: 206 mg/dL — ABNORMAL HIGH (ref 70–99)
POTASSIUM: 3.7 mmol/L (ref 3.5–5.1)
SODIUM: 141 mmol/L (ref 135–145)

## 2015-03-16 LAB — PROTIME-INR
INR: 2.93 — AB (ref 0.00–1.49)
Prothrombin Time: 30.8 seconds — ABNORMAL HIGH (ref 11.6–15.2)

## 2015-03-16 LAB — CBC
HCT: 29.5 % — ABNORMAL LOW (ref 39.0–52.0)
Hemoglobin: 8.9 g/dL — ABNORMAL LOW (ref 13.0–17.0)
MCH: 25.4 pg — AB (ref 26.0–34.0)
MCHC: 30.2 g/dL (ref 30.0–36.0)
MCV: 84.3 fL (ref 78.0–100.0)
Platelets: 188 10*3/uL (ref 150–400)
RBC: 3.5 MIL/uL — AB (ref 4.22–5.81)
RDW: 15.4 % (ref 11.5–15.5)
WBC: 13.4 10*3/uL — AB (ref 4.0–10.5)

## 2015-03-16 LAB — GLUCOSE, CAPILLARY
GLUCOSE-CAPILLARY: 229 mg/dL — AB (ref 70–99)
GLUCOSE-CAPILLARY: 232 mg/dL — AB (ref 70–99)
GLUCOSE-CAPILLARY: 226 mg/dL — AB (ref 70–99)
GLUCOSE-CAPILLARY: 190 mg/dL — AB (ref 70–99)

## 2015-03-16 MED ORDER — FERROUS SULFATE 325 (65 FE) MG PO TABS
325.0000 mg | ORAL_TABLET | Freq: Three times a day (TID) | ORAL | Status: DC
  Administered 2015-03-16 – 2015-03-17 (×5): 325 mg via ORAL
  Filled 2015-03-16 (×6): qty 1

## 2015-03-16 MED ORDER — POLYETHYLENE GLYCOL 3350 17 G PO PACK
17.0000 g | PACK | Freq: Every day | ORAL | Status: DC
  Administered 2015-03-16 – 2015-03-17 (×2): 17 g via ORAL
  Filled 2015-03-16 (×2): qty 1

## 2015-03-16 NOTE — Progress Notes (Signed)
[name], Brother-In-Law would like to be contact by social worker on Monday 3/28.

## 2015-03-16 NOTE — Social Work (Signed)
CSW met with patient who was accompanied by his brother in law. CSW attempted to complete an assessment. Patient stated that he would not be able to clearly discuss his needs. CSW will contact patient's wife for more information.  Christene Lye MSW, [place]

## 2015-03-16 NOTE — Progress Notes (Signed)
Patient Demographics  Frank Mclean, is a 74 y.o. male, DOB - 23-Jan-1941, YIA:[id number]  Admit date - 03/13/2015   Admitting Physician Lorretta Harp, MD  Outpatient Primary MD for the patient is [name], MD  LOS - 2   Chief Complaint  Patient presents with  . Weakness        Subjective:   Frank Mclean today has, No headache, No chest pain, No abdominal pain - No Nausea, No new weakness tingling or numbness, Improved Cough - SOB.    Assessment & Plan    1.HCAP - improved, continue empiric antibiotics follow cultures, continue supportive care with oxygen and nebulizer treatments. Question aspiration, seen by speech currently on dysphagia diet 2 - Necter thick liquid with feeding assistance and aspiration precautions.   2. Chronic systolic heart failure EF 30-35%, has AICD. Discharged by cardiology yesterday. Currently compensated. Continue combination of Coreg, hydralazine, Imdur, will resume low-dose diuretic from 03-16-15. No ACE/ARB or Aldactone due to CK D.Low dose diuretic resumed. Cards following.    3. CAD. Has left bundle branch block. Mildly elevated troponin likely due to #1 above. Cardiology consulted. Chest pain-free. Further management per cardiology. Not on aspirin since he is on Coumadin. Continue beta blocker and statin for secondary prevention.    4. Paroxysmal atrial fibrillation. CHADS2VASC score is 9 - on Coumadin and Coreg continue. Pharmacy monitoring INR.  Lab Results  Component Value Date   INR 2.93* 03/16/2015   INR 2.73* 03/15/2015   INR 2.60* 03/14/2015   PROTIME 15.7 05/28/2009     5. 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 Monitor closely.   6. Bilateral dull ongoing knee pain. Patient says this has been ongoing for the last 3-4 weeks on an  intermittent basis. consistent with gout flare, uric acid elevated, exam is unremarkable, improved with Medrol Dosepak trial.   7. Hypernatremia. Mildly dehydrated, resolved with D5W.   8. Anemia. Likely due to dilution along with mild iron deficiency, on oral iron at home which will be increased, no need for transfusion, will follow with PCP for age appropriate anemia workup.   9. Leukocytosis. This is due to the steroid use for gout. Clinically pneumonia is much improved.     Code Status: Full  Family Communication: None  Disposition Plan: TBD   Procedures     Consults  Cards, Speech   Medications  Scheduled Meds: . amLODipine  10 mg Oral Daily  . atorvastatin  20 mg Oral Daily  . budesonide-formoterol  2 puff Inhalation BID  . carvedilol  12.5 mg Oral BID WC  . ceFEPime (MAXIPIME) IV  1 g Intravenous Q24H  . cholecalciferol  1,000 Units Oral Daily  . escitalopram  5 mg Oral QHS  . feeding supplement (ENSURE ENLIVE)  237 mL Oral BID BM  . ferrous sulfate  325 mg Oral Q breakfast  . hydrALAZINE  100 mg Oral TID  . insulin aspart  0-9 Units Subcutaneous TID WC  . isosorbide mononitrate  90 mg Oral BID  . levothyroxine  200 mcg Oral QAC breakfast  . methylPREDNISolone  4 mg Oral 3 x daily with food  . [START ON 03/17/2015] methylPREDNISolone  4 mg Oral 4X daily taper  . methylPREDNISolone  8 mg Oral Nightly  . multivitamin with minerals  1 tablet Oral Daily  . pantoprazole  40 mg Oral BID  . polyethylene glycol  17 g Oral Daily  . torsemide  10 mg Oral Daily  . vancomycin  1,000 mg Intravenous Q24H  . cyanocobalamin  1,000 mcg Oral Daily  . warfarin  2.5 mg Oral Q Fri-1800  . warfarin  5 mg Oral Once per day on Sun Mon Tue Wed Thu Sat  . Warfarin - Pharmacist Dosing Inpatient   Does not apply q1800   Continuous Infusions:   PRN Meds:.acetaminophen, HYDROcodone-acetaminophen, hydrOXYzine, RESOURCE THICKENUP CLEAR  DVT Prophylaxis  Coumadin  Lab Results    Component Value Date   INR 2.93* 03/16/2015   INR 2.73* 03/15/2015   INR 2.60* 03/14/2015   PROTIME 15.7 05/28/2009     Lab Results  Component Value Date   PLT 188 03/16/2015    Antibiotics     Anti-infectives    Start     Dose/Rate Route Frequency Ordered Stop   03/15/15 0500  ceFEPIme (MAXIPIME) 1 g in dextrose 5 % 50 mL IVPB     1 g 100 mL/hr over 30 Minutes Intravenous Every 24 hours 03/14/15 0455     03/15/15 0400  vancomycin (VANCOCIN) IVPB 1000 mg/200 mL premix     1,000 mg 200 mL/hr over 60 Minutes Intravenous Every 24 hours 03/14/15 0455     03/14/15 1000  ceFEPIme (MAXIPIME) 2 g in dextrose 5 % 50 mL IVPB  Status:  Discontinued     2 g 100 mL/hr over 30 Minutes Intravenous Every 12 hours 03/14/15 0455 03/14/15 0457   03/14/15 0315  vancomycin (VANCOCIN) 1,500 mg in sodium chloride 0.9 % 500 mL IVPB     1,500 mg 250 mL/hr over 120 Minutes Intravenous  Once 03/14/15 0240 03/14/15 0532   03/14/15 0245  ceFEPIme (MAXIPIME) 2 g in dextrose 5 % 50 mL IVPB     2 g 100 mL/hr over 30 Minutes Intravenous  Once 03/14/15 0240 03/14/15 0332          Objective:   Filed Vitals:   03/15/15 1449 03/15/15 1610 03/15/15 2239 03/16/15 0535  BP: 109/60 108/61 117/61 127/67  Pulse: 60  63 57  Temp: 99.1 F (37.3 C)  98.2 F (36.8 C) 98.3 F (36.8 C)  TempSrc: Axillary  Oral Oral  Resp: Height:      Weight:      SpO2: 99%  98% 99%    Wt Readings from Last 3 Encounters:  03/14/15 95.5 kg (210 lb 8.6 oz)  03/13/15 92.08 kg (203 lb)  03/05/15 101.969 kg (224 lb 12.8 oz)     Intake/Output Summary (Last 24 hours) at 03/16/15 1005 Last data filed at 03/16/15 0921  Gross per 24 hour  Intake 2044.25 ml  Output   1150 ml  Net 894.25 ml     Physical Exam  Awake Alert, Oriented X 3, No new F.N deficits, Normal affect [place].AT,PERRAL Supple Neck,No JVD, No cervical lymphadenopathy appriciated.  Symmetrical Chest wall movement, Good air movement  bilaterally, CTAB RRR,No Gallops,Rubs or new Murmurs, No Parasternal Heave +ve B.Sounds, Abd Soft, No tenderness, No organomegaly appriciated, No rebound - guarding or rigidity. No Cyanosis, Clubbing or edema, No new Rash or bruise      Data Review   Micro Results Recent Results (from the past 240 hour(s))  Culture, blood (routine x 2)     Status:  None (Preliminary result)   Collection Time: 03/13/15 11:47 PM  Result Value Ref Range Status   Specimen Description BLOOD LEFT HAND  Final   Special Requests BOTTLES DRAWN AEROBIC AND ANAEROBIC 5CC EACH  Final   Culture   Final           BLOOD CULTURE RECEIVED NO GROWTH TO DATE CULTURE WILL BE HELD FOR 5 DAYS BEFORE ISSUING A FINAL NEGATIVE REPORT Performed at [facility]    Report Status PENDING  Incomplete  Culture, blood (routine x 2)     Status: None (Preliminary result)   Collection Time: 03/13/15 11:55 PM  Result Value Ref Range Status   Specimen Description BLOOD LEFT WRIST  Final   Special Requests BOTTLES DRAWN AEROBIC AND ANAEROBIC 5CC EACH  Final   Culture   Final           BLOOD CULTURE RECEIVED NO GROWTH TO DATE CULTURE WILL BE HELD FOR 5 DAYS BEFORE ISSUING A FINAL NEGATIVE REPORT Performed at [facility]    Report Status PENDING  Incomplete    Radiology Reports Dg Chest 2 View  03/14/2015   CLINICAL DATA:  Acute onset of generalized weakness and shortness of breath. Initial encounter.  EXAM: CHEST  2 VIEW  COMPARISON:  Chest radiograph performed 03/13/2015  FINDINGS: The lungs are well-aerated. Mild vascular congestion is noted. Mild left basilar opacity may reflect atelectasis or possibly minimal interstitial edema. There is no evidence of pleural effusion or pneumothorax.  The heart is enlarged. A pacemaker/AICD is noted at the left chest wall, with leads ending at the right atrium and right ventricle. No acute osseous abnormalities are seen.  IMPRESSION: Mild vascular congestion and cardiomegaly noted.  Mild left basilar airspace opacity may reflect atelectasis or possibly minimal interstitial edema.   Electronically Signed   By: Roanna Raider M.D.   On: 03/14/2015 00:28   Dg Chest 2 View  03/13/2015   CLINICAL DATA:  74 year old male status post pacemaker placement  EXAM: CHEST  2 VIEW  COMPARISON:  Preoperative chest x-ray 03/10/2015  FINDINGS: New left subclavian approach cardiac rhythm maintenance device. Leads project over the right atrium and right ventricle. No evidence of pneumothorax or hemothorax. Stable cardiomegaly. Stable mediastinal contours. Atherosclerotic calcifications in the aorta. Pulmonary hyperexpansion and central bronchitic change. No pulmonary edema. No acute osseous abnormality.  IMPRESSION: 1. New left subclavian approach cardiac rhythm maintenance device with leads projecting over the right atrium and right ventricle. 2. No evidence of pneumothorax, hemothorax or other complication.   Electronically Signed   By: Malachy Moan M.D.   On: 03/13/2015 07:43   Dg Chest 2 View  03/10/2015   CLINICAL DATA:  Acute onset of shortness of breath. Initial encounter.  EXAM: CHEST  2 VIEW  COMPARISON:  Chest radiograph performed 11/27/2013  FINDINGS: The lungs are well-aerated. Vascular congestion is noted, with bilateral central airspace opacities and a small left pleural effusion, likely reflecting mild recurrent pulmonary edema. No pneumothorax is seen.  The heart is enlarged.  No acute osseous abnormalities are seen.  IMPRESSION: Vascular congestion and cardiomegaly, with bilateral central airspace opacities and a small left pleural effusion, likely reflecting mild recurrent pulmonary edema.   Electronically Signed   By: Roanna Raider M.D.   On: 03/10/2015 22:54   Ct Head Wo Contrast  03/14/2015   CLINICAL DATA:  Acute onset of generalized weakness. Initial encounter.  EXAM: CT HEAD WITHOUT CONTRAST  TECHNIQUE: Contiguous axial images were obtained  from the base of the skull through  the vertex without intravenous contrast.  COMPARISON:  CT of the head performed 09/13/2013  FINDINGS: There is no evidence of acute infarction, mass lesion, or intra- or extra-axial hemorrhage on CT.  Prominence of the ventricles and sulci reflects mild to moderate cortical volume loss. Cerebellar atrophy is noted. Scattered periventricular and subcortical white matter change likely reflects small vessel ischemic microangiopathy. A prominent chronic lacunar infarct is noted at the right thalamus, and a chronic lacunar infarct is seen at the left basal ganglia.  The brainstem and fourth ventricle are within normal limits. The cerebral hemispheres demonstrate grossly normal gray-white differentiation. No mass effect or midline shift is seen.  There is no evidence of fracture; visualized osseous structures are unremarkable in appearance. The orbits are within normal limits. The paranasal sinuses and mastoid air cells are well-aerated. No significant soft tissue abnormalities are seen.  IMPRESSION: 1. No acute intracranial pathology seen on CT. 2. Mild to moderate cortical volume loss and scattered small vessel ischemic microangiopathy. 3. 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   Electronically Signed   By: Roanna Raider M.D.   On: 03/14/2015 00:48   Ct Chest Wo Contrast  03/14/2015   CLINICAL DATA:  Acute onset of shortness of breath and generalized weakness. Initial encounter.  EXAM: CT CHEST WITHOUT CONTRAST  TECHNIQUE: Multidetector CT imaging of the chest was performed following the standard protocol without IV contrast.  COMPARISON:  Chest radiograph performed earlier today at 12:03 a.m.  FINDINGS: Patchy bibasilar airspace opacities are noted at the lung bases, in a peribronchovascular distribution, and at the left lingula. This is concerning for pneumonia. The lungs are otherwise clear. No pleural effusion or pneumothorax is seen. No masses are identified.  Scattered coronary  artery calcifications are seen. The heart is mildly enlarged. A pacemaker is noted at the left chest wall, with leads ending at the right atrium and right ventricle. No pericardial effusion is identified. No mediastinal lymphadenopathy is seen. Scattered calcification is noted along the aortic arch. The great vessels are grossly unremarkable in appearance. The thyroid gland is within normal limits. No axillary lymphadenopathy is seen.  The visualized portions of the liver and spleen are unremarkable, aside from a calcified granuloma at the right hepatic lobe. Mildly increased attenuation within the gallbladder is nonspecific; no definite stones are seen. The visualized portions of the pancreas and adrenal glands are within normal limits. A 4.6 cm left renal cyst is noted. Mild calcification is noted along the proximal abdominal aorta  No acute osseous abnormalities are seen. Mild degenerative change is noted at the lower cervical spine.  IMPRESSION: 1. Patchy bibasilar airspace opacities at the lung bases, in a peribronchovascular distribution, and at the left lingula. This is concerning for multifocal pneumonia. 2. Scattered coronary artery calcifications seen. Mild cardiomegaly noted. 3. Left renal cyst noted. 4. Mild calcification along the proximal abdominal aorta.   Electronically Signed   By: Roanna Raider M.D.   On: 03/14/2015 02:32     CBC  Recent Labs Lab 03/10/15 2118 03/13/15 2358 03/15/15 0553 03/16/15 0800  WBC 9.5 14.9* 12.7* 13.4*  HGB 10.7* 10.0* 8.8* 8.9*  HCT 35.4* 32.6* 29.4* 29.5*  PLT 256 227 198 188  MCV 87.4 86.0 86.5 84.3  MCH 26.4 26.4 25.9* 25.4*  MCHC 30.2 30.7 29.9* 30.2  RDW 15.8* 15.9* 15.8* 15.4  LYMPHSABS 0.7 0.6*  --   --   MONOABS 0.5  1.1*  --   --   EOSABS 0.1 0.0  --   --   BASOSABS 0.0 0.0  --   --     Chemistries   Recent Labs Lab 03/13/15 0650 03/13/15 1030 03/13/15 2358 03/15/15 0553 03/16/15 0800  NA 145 147* 145 147* 141  K 3.8 3.6 3.9 3.6  3.7  CL 110 110 110 112 108  CO2 24 27 28 27 23   GLUCOSE 120* 114* 106* 151* 206*  BUN 22 20 27* 31* 33*  CREATININE 2.28* 2.35* 2.75* 2.71* 2.74*  CALCIUM 8.9 9.0 8.8 8.6 8.7  AST  --   --  17  --   --   ALT  --   --  7  --   --   ALKPHOS  --   --  70  --   --   BILITOT  --   --  0.8  --   --    ------------------------------------------------------------------------------------------------------------------ estimated creatinine clearance is 27.1 mL/min (by C-G formula based on Cr of 2.74). ------------------------------------------------------------------------------------------------------------------ No results for input(s): HGBA1C in the last 72 hours. ------------------------------------------------------------------------------------------------------------------ No results for input(s): CHOL, HDL, LDLCALC, TRIG, CHOLHDL, LDLDIRECT in the last 72 hours. ------------------------------------------------------------------------------------------------------------------ No results for input(s): TSH, T4TOTAL, T3FREE, THYROIDAB in the last 72 hours.  Invalid input(s): FREET3 ------------------------------------------------------------------------------------------------------------------  Recent Labs  03/15/15 1010  VITAMINB12 733  FOLATE 10.1  FERRITIN 333*  TIBC Not calculated due to Iron <10.  IRON <10*  RETICCTPCT 0.5    Coagulation profile  Recent Labs Lab 03/13/15 0650 03/13/15 2358 03/14/15 0805 03/15/15 0553 03/16/15 0800  INR 2.56* 2.88* 2.60* 2.73* 2.93*    No results for input(s): DDIMER in the last 72 hours.  Cardiac Enzymes  Recent Labs Lab 03/14/15 0805 03/14/15 1435 03/14/15 2035  TROPONINI 0.25* 0.33* 0.30*   ------------------------------------------------------------------------------------------------------------------ Invalid input(s): POCBNP     Time Spent in minutes   35   Dre Gamino K M.D on 03/16/2015 at 10:05  AM  Between 7am to 7pm - Pager - [phone]  After 7pm go to [email or web address] - password [facility]  [facility]   Office  [phone]

## 2015-03-16 NOTE — Progress Notes (Signed)
ANTICOAGULATION CONSULT NOTE - Follow Up Consult  Pharmacy Consult for coumadin Indication: atrial fibrillation  Allergies  Allergen Reactions  . Levothyroxine Sodium Other (See Comments)    MUST TAKE BRAND NAME GENERIC DOESN'T WORK FOR PATIENT  . Chicken Protein     Does not like chicken or [place]  . Eggs Or Egg-Derived Products     Does not like eggs    Patient Measurements: Height:  (188 cm) Weight: 210 lb 8.6 oz (95.5 kg) IBW/kg (Calculated) : 82.2 Heparin Dosing Weight:   Vital Signs: Temp: 98.3 F (36.8 C) (03/27 0535) Temp Source: Oral (03/27 0535) BP: 136/69 mmHg (03/27 1017) Pulse Rate: 57 (03/27 0535)  Labs:  Recent Labs  03/13/15 2358  03/14/15 0805 03/14/15 1435 03/14/15 2035 03/15/15 0553 03/16/15 0800  HGB 10.0*  --   --   --   --  8.8* 8.9*  HCT 32.6*  --   --   --   --  29.4* 29.5*  PLT 227  --   --   --   --  198 188  APTT  --   --  52*  --   --   --   --   LABPROT 30.4*  --  28.0*  --   --  29.1* 30.8*  INR 2.88*  --  2.60*  --   --  2.73* 2.93*  CREATININE 2.75*  --   --   --   --  2.71* 2.74*  TROPONINI  --   < > 0.25* 0.33* 0.30*  --   --   < > = values in this interval not displayed.  Estimated Creatinine Clearance: 27.1 mL/min (by C-G formula based on Cr of 2.74).   Medications:  Scheduled:  . amLODipine  10 mg Oral Daily  . atorvastatin  20 mg Oral Daily  . budesonide-formoterol  2 puff Inhalation BID  . carvedilol  12.5 mg Oral BID WC  . ceFEPime (MAXIPIME) IV  1 g Intravenous Q24H  . cholecalciferol  1,000 Units Oral Daily  . escitalopram  5 mg Oral QHS  . feeding supplement (ENSURE ENLIVE)  237 mL Oral BID BM  . ferrous sulfate  325 mg Oral TID WC  . hydrALAZINE  100 mg Oral TID  . insulin aspart  0-9 Units Subcutaneous TID WC  . isosorbide mononitrate  90 mg Oral BID  . levothyroxine  200 mcg Oral QAC breakfast  . methylPREDNISolone  4 mg Oral 3 x daily with food  . [START ON 03/17/2015] methylPREDNISolone  4 mg Oral  4X daily taper  . methylPREDNISolone  8 mg Oral Nightly  . multivitamin with minerals  1 tablet Oral Daily  . pantoprazole  40 mg Oral BID  . polyethylene glycol  17 g Oral Daily  . torsemide  10 mg Oral Daily  . vancomycin  1,000 mg Intravenous Q24H  . cyanocobalamin  1,000 mcg Oral Daily  . warfarin  2.5 mg Oral Q Fri-1800  . warfarin  5 mg Oral Once per day on Sun Mon Tue Wed Thu Sat  . Warfarin - Pharmacist Dosing Inpatient   Does not apply q1800   Infusions:    Assessment: 74 yo male with afib is currently on therapeutic coumadin.  INR today is up to 2.93. Goal of Therapy:  INR 2-3 Monitor platelets by anticoagulation protocol: Yes   Plan:  - continue home coumadin dose 5 mg daily except 2.5 mg on Fridays.  - INR in am  Jerine Surles, Tsz-Yin 03/16/2015,10:59 AM

## 2015-03-17 ENCOUNTER — Inpatient Hospital Stay (HOSPITAL_COMMUNITY)
Admission: RE | Admit: 2015-03-17 | Discharge: 2015-04-01 | DRG: 091 | Disposition: A | Discharge status: Home Health | Payer: Medicare Other | Source: Intra-hospital | Attending: Physical Medicine & Rehabilitation | Admitting: Physical Medicine & Rehabilitation

## 2015-03-17 DIAGNOSIS — F329 Major depressive disorder, single episode, unspecified: Secondary | ICD-10-CM | POA: Diagnosis present

## 2015-03-17 DIAGNOSIS — D649 Anemia, unspecified: Secondary | ICD-10-CM | POA: Diagnosis present

## 2015-03-17 DIAGNOSIS — E1121 Type 2 diabetes mellitus with diabetic nephropathy: Secondary | ICD-10-CM | POA: Diagnosis not present

## 2015-03-17 DIAGNOSIS — I5042 Chronic combined systolic (congestive) and diastolic (congestive) heart failure: Secondary | ICD-10-CM | POA: Diagnosis present

## 2015-03-17 DIAGNOSIS — Z833 Family history of diabetes mellitus: Secondary | ICD-10-CM | POA: Diagnosis not present

## 2015-03-17 DIAGNOSIS — E1142 Type 2 diabetes mellitus with diabetic polyneuropathy: Secondary | ICD-10-CM | POA: Diagnosis present

## 2015-03-17 DIAGNOSIS — E1122 Type 2 diabetes mellitus with diabetic chronic kidney disease: Secondary | ICD-10-CM | POA: Diagnosis present

## 2015-03-17 DIAGNOSIS — I5023 Acute on chronic systolic (congestive) heart failure: Secondary | ICD-10-CM | POA: Insufficient documentation

## 2015-03-17 DIAGNOSIS — F4321 Adjustment disorder with depressed mood: Secondary | ICD-10-CM | POA: Diagnosis present

## 2015-03-17 DIAGNOSIS — R269 Unspecified abnormalities of gait and mobility: Principal | ICD-10-CM

## 2015-03-17 DIAGNOSIS — Z8673 Personal history of transient ischemic attack (TIA), and cerebral infarction without residual deficits: Secondary | ICD-10-CM | POA: Diagnosis not present

## 2015-03-17 DIAGNOSIS — E785 Hyperlipidemia, unspecified: Secondary | ICD-10-CM

## 2015-03-17 DIAGNOSIS — R0602 Shortness of breath: Secondary | ICD-10-CM | POA: Diagnosis not present

## 2015-03-17 DIAGNOSIS — E039 Hypothyroidism, unspecified: Secondary | ICD-10-CM | POA: Diagnosis present

## 2015-03-17 DIAGNOSIS — R5381 Other malaise: Secondary | ICD-10-CM

## 2015-03-17 DIAGNOSIS — I5043 Acute on chronic combined systolic (congestive) and diastolic (congestive) heart failure: Secondary | ICD-10-CM | POA: Insufficient documentation

## 2015-03-17 DIAGNOSIS — Z87891 Personal history of nicotine dependence: Secondary | ICD-10-CM

## 2015-03-17 DIAGNOSIS — I429 Cardiomyopathy, unspecified: Secondary | ICD-10-CM | POA: Diagnosis present

## 2015-03-17 DIAGNOSIS — N189 Chronic kidney disease, unspecified: Secondary | ICD-10-CM | POA: Diagnosis not present

## 2015-03-17 DIAGNOSIS — I129 Hypertensive chronic kidney disease with stage 1 through stage 4 chronic kidney disease, or unspecified chronic kidney disease: Secondary | ICD-10-CM | POA: Diagnosis present

## 2015-03-17 DIAGNOSIS — E1129 Type 2 diabetes mellitus with other diabetic kidney complication: Secondary | ICD-10-CM

## 2015-03-17 DIAGNOSIS — J189 Pneumonia, unspecified organism: Secondary | ICD-10-CM | POA: Diagnosis not present

## 2015-03-17 DIAGNOSIS — M1990 Unspecified osteoarthritis, unspecified site: Secondary | ICD-10-CM | POA: Diagnosis present

## 2015-03-17 DIAGNOSIS — Z7901 Long term (current) use of anticoagulants: Secondary | ICD-10-CM | POA: Diagnosis not present

## 2015-03-17 DIAGNOSIS — I480 Paroxysmal atrial fibrillation: Secondary | ICD-10-CM | POA: Diagnosis present

## 2015-03-17 DIAGNOSIS — N183 Chronic kidney disease, stage 3 unspecified: Secondary | ICD-10-CM | POA: Diagnosis present

## 2015-03-17 DIAGNOSIS — J449 Chronic obstructive pulmonary disease, unspecified: Secondary | ICD-10-CM | POA: Diagnosis not present

## 2015-03-17 DIAGNOSIS — I447 Left bundle-branch block, unspecified: Secondary | ICD-10-CM | POA: Diagnosis present

## 2015-03-17 DIAGNOSIS — I2510 Atherosclerotic heart disease of native coronary artery without angina pectoris: Secondary | ICD-10-CM | POA: Diagnosis present

## 2015-03-17 DIAGNOSIS — M109 Gout, unspecified: Secondary | ICD-10-CM | POA: Diagnosis present

## 2015-03-17 DIAGNOSIS — R531 Weakness: Secondary | ICD-10-CM | POA: Diagnosis present

## 2015-03-17 DIAGNOSIS — K219 Gastro-esophageal reflux disease without esophagitis: Secondary | ICD-10-CM | POA: Diagnosis present

## 2015-03-17 DIAGNOSIS — J4489 Other specified chronic obstructive pulmonary disease: Secondary | ICD-10-CM | POA: Diagnosis present

## 2015-03-17 LAB — PROTIME-INR
INR: 3.25 — ABNORMAL HIGH (ref 0.00–1.49)
Prothrombin Time: 33.4 seconds — ABNORMAL HIGH (ref 11.6–15.2)

## 2015-03-17 LAB — BASIC METABOLIC PANEL
Anion gap: 10 (ref 5–15)
BUN: 42 mg/dL — ABNORMAL HIGH (ref 6–23)
CO2: 23 mmol/L (ref 19–32)
Calcium: 8.7 mg/dL (ref 8.4–10.5)
Chloride: 108 mmol/L (ref 96–112)
Creatinine, Ser: 2.57 mg/dL — ABNORMAL HIGH (ref 0.50–1.35)
GFR calc non Af Amer: 23 mL/min — ABNORMAL LOW (ref >90)
GFR, EST AFRICAN AMERICAN: 26 mL/min — AB (ref >90)
Glucose, Bld: 203 mg/dL — ABNORMAL HIGH (ref 70–99)
Potassium: 4.2 mmol/L (ref 3.5–5.1)
SODIUM: 141 mmol/L (ref 135–145)

## 2015-03-17 LAB — LEGIONELLA ANTIGEN, URINE

## 2015-03-17 LAB — GLUCOSE, CAPILLARY
GLUCOSE-CAPILLARY: 275 mg/dL — AB (ref 70–99)
Glucose-Capillary: 199 mg/dL — ABNORMAL HIGH (ref 70–99)
Glucose-Capillary: 169 mg/dL — ABNORMAL HIGH (ref 70–99)
Glucose-Capillary: 214 mg/dL — ABNORMAL HIGH (ref 70–99)

## 2015-03-17 MED ORDER — ACETAMINOPHEN 325 MG PO TABS
650.0000 mg | ORAL_TABLET | Freq: Four times a day (QID) | ORAL | Status: DC | PRN
  Administered 2015-03-22 – 2015-03-30 (×4): 650 mg via ORAL
  Filled 2015-03-17 (×5): qty 2

## 2015-03-17 MED ORDER — ISOSORBIDE MONONITRATE ER 60 MG PO TB24
90.0000 mg | ORAL_TABLET | Freq: Two times a day (BID) | ORAL | Status: DC
  Administered 2015-03-17 – 2015-04-01 (×30): 90 mg via ORAL
  Filled 2015-03-17 (×33): qty 1

## 2015-03-17 MED ORDER — ONDANSETRON HCL 4 MG/2ML IJ SOLN: 4.0000 mg | Freq: Four times a day (QID) | INTRAMUSCULAR | Status: DC | PRN

## 2015-03-17 MED ORDER — INSULIN ASPART 100 UNIT/ML ~~LOC~~ SOLN
0.0000 [IU] | Freq: Three times a day (TID) | SUBCUTANEOUS | Status: DC
  Administered 2015-03-18 (×2): 2 [IU] via SUBCUTANEOUS
  Administered 2015-03-18: 3 [IU] via SUBCUTANEOUS
  Administered 2015-03-19 – 2015-03-20 (×5): 2 [IU] via SUBCUTANEOUS
  Administered 2015-03-20: 3 [IU] via SUBCUTANEOUS
  Administered 2015-03-21: 1 [IU] via SUBCUTANEOUS
  Administered 2015-03-21: 2 [IU] via SUBCUTANEOUS
  Administered 2015-03-21: 1 [IU] via SUBCUTANEOUS
  Administered 2015-03-22: 2 [IU] via SUBCUTANEOUS
  Administered 2015-03-22 – 2015-03-29 (×6): 1 [IU] via SUBCUTANEOUS

## 2015-03-17 MED ORDER — POTASSIUM CHLORIDE CRYS ER 20 MEQ PO TBCR: EXTENDED_RELEASE_TABLET | ORAL | Status: DC

## 2015-03-17 MED ORDER — WARFARIN SODIUM 2.5 MG PO TABS
2.5000 mg | ORAL_TABLET | ORAL | Status: DC
  Administered 2015-03-17: 2.5 mg via ORAL
  Filled 2015-03-17: qty 1

## 2015-03-17 MED ORDER — CARVEDILOL 12.5 MG PO TABS
12.5000 mg | ORAL_TABLET | Freq: Two times a day (BID) | ORAL | Status: DC
  Administered 2015-03-18 – 2015-04-01 (×29): 12.5 mg via ORAL
  Filled 2015-03-17 (×31): qty 1

## 2015-03-17 MED ORDER — COLCHICINE 0.6 MG PO TABS: 0.6000 mg | ORAL_TABLET | Freq: Every day | ORAL | Status: DC

## 2015-03-17 MED ORDER — ONDANSETRON HCL 4 MG PO TABS
4.0000 mg | ORAL_TABLET | Freq: Four times a day (QID) | ORAL | Status: DC | PRN
  Administered 2015-03-21 – 2015-03-27 (×4): 4 mg via ORAL
  Filled 2015-03-17 (×4): qty 1

## 2015-03-17 MED ORDER — WARFARIN - PHARMACIST DOSING INPATIENT
Freq: Every day | Status: DC
  Administered 2015-03-27: 18:00:00

## 2015-03-17 MED ORDER — WARFARIN SODIUM 5 MG PO TABS
5.0000 mg | ORAL_TABLET | ORAL | Status: DC
  Administered 2015-03-18 – 2015-03-30 (×10): 5 mg via ORAL
  Filled 2015-03-17 (×10): qty 1

## 2015-03-17 MED ORDER — VITAMIN D3 25 MCG (1000 UNIT) PO TABS
1000.0000 [IU] | ORAL_TABLET | Freq: Every day | ORAL | Status: DC
  Administered 2015-03-18 – 2015-04-01 (×15): 1000 [IU] via ORAL
  Filled 2015-03-17 (×16): qty 1

## 2015-03-17 MED ORDER — WARFARIN SODIUM 2.5 MG PO TABS
2.5000 mg | ORAL_TABLET | ORAL | Status: DC
  Administered 2015-03-21 – 2015-03-28 (×3): 2.5 mg via ORAL
  Filled 2015-03-17 (×4): qty 1

## 2015-03-17 MED ORDER — TORSEMIDE 10 MG PO TABS
10.0000 mg | ORAL_TABLET | Freq: Every day | ORAL | Status: DC
  Administered 2015-03-18 – 2015-04-01 (×15): 10 mg via ORAL
  Filled 2015-03-17 (×16): qty 1

## 2015-03-17 MED ORDER — TORSEMIDE 20 MG PO TABS: 20.0000 mg | ORAL_TABLET | Freq: Every day | ORAL | Status: DC

## 2015-03-17 MED ORDER — ENSURE ENLIVE PO LIQD
237.0000 mL | Freq: Two times a day (BID) | ORAL | Status: DC
  Administered 2015-03-18 (×2): 237 mL via ORAL

## 2015-03-17 MED ORDER — PANTOPRAZOLE SODIUM 40 MG PO TBEC
40.0000 mg | DELAYED_RELEASE_TABLET | Freq: Two times a day (BID) | ORAL | Status: DC
  Administered 2015-03-17 – 2015-04-01 (×29): 40 mg via ORAL
  Filled 2015-03-17 (×29): qty 1

## 2015-03-17 MED ORDER — METHYLPREDNISOLONE 4 MG PO KIT
4.0000 mg | PACK | Freq: Four times a day (QID) | ORAL | Status: AC
  Administered 2015-03-17 – 2015-03-21 (×8): 4 mg via ORAL

## 2015-03-17 MED ORDER — AMOXICILLIN-POT CLAVULANATE 500-125 MG PO TABS: 1.0000 | ORAL_TABLET | Freq: Three times a day (TID) | ORAL | Status: AC

## 2015-03-17 MED ORDER — ATORVASTATIN CALCIUM 20 MG PO TABS
20.0000 mg | ORAL_TABLET | Freq: Every day | ORAL | Status: DC
  Administered 2015-03-18 – 2015-03-31 (×15): 20 mg via ORAL
  Filled 2015-03-17 (×16): qty 1

## 2015-03-17 MED ORDER — AMOXICILLIN-POT CLAVULANATE 500-125 MG PO TABS
1.0000 | ORAL_TABLET | Freq: Three times a day (TID) | ORAL | Status: AC
  Administered 2015-03-17 – 2015-03-24 (×21): 500 mg via ORAL
  Filled 2015-03-17 (×21): qty 1

## 2015-03-17 MED ORDER — HYDROCODONE-ACETAMINOPHEN 5-325 MG PO TABS
1.0000 | ORAL_TABLET | Freq: Four times a day (QID) | ORAL | Status: DC | PRN
  Administered 2015-03-26 – 2015-04-01 (×4): 1 via ORAL
  Filled 2015-03-17 (×4): qty 1

## 2015-03-17 MED ORDER — LEVOTHYROXINE SODIUM 200 MCG PO TABS
200.0000 ug | ORAL_TABLET | Freq: Every day | ORAL | Status: DC
  Administered 2015-03-18 – 2015-04-01 (×15): 200 ug via ORAL
  Filled 2015-03-17 (×16): qty 1

## 2015-03-17 MED ORDER — BUDESONIDE-FORMOTEROL FUMARATE 160-4.5 MCG/ACT IN AERO
2.0000 | INHALATION_SPRAY | Freq: Two times a day (BID) | RESPIRATORY_TRACT | Status: DC
  Administered 2015-03-17 – 2015-04-01 (×27): 2 via RESPIRATORY_TRACT
  Filled 2015-03-17: qty 6

## 2015-03-17 MED ORDER — POLYETHYLENE GLYCOL 3350 17 G PO PACK
17.0000 g | PACK | Freq: Every day | ORAL | Status: DC
  Administered 2015-03-18 – 2015-04-01 (×14): 17 g via ORAL
  Filled 2015-03-17 (×17): qty 1

## 2015-03-17 MED ORDER — HYDRALAZINE HCL 50 MG PO TABS
100.0000 mg | ORAL_TABLET | Freq: Three times a day (TID) | ORAL | Status: DC
  Administered 2015-03-17 – 2015-04-01 (×43): 100 mg via ORAL
  Filled 2015-03-17 (×48): qty 2

## 2015-03-17 MED ORDER — FERROUS SULFATE 325 (65 FE) MG PO TABS
325.0000 mg | ORAL_TABLET | Freq: Three times a day (TID) | ORAL | Status: DC
  Administered 2015-03-18 – 2015-04-01 (×43): 325 mg via ORAL
  Filled 2015-03-17 (×46): qty 1

## 2015-03-17 MED ORDER — VITAMIN B-12 1000 MCG PO TABS
1000.0000 ug | ORAL_TABLET | Freq: Every day | ORAL | Status: DC
  Administered 2015-03-18 – 2015-04-01 (×15): 1000 ug via ORAL
  Filled 2015-03-17 (×16): qty 1

## 2015-03-17 MED ORDER — PREDNISONE 5 MG PO TABS: ORAL_TABLET | ORAL | Status: DC

## 2015-03-17 MED ORDER — ADULT MULTIVITAMIN W/MINERALS CH
1.0000 | ORAL_TABLET | Freq: Every day | ORAL | Status: DC
  Administered 2015-03-18 – 2015-04-01 (×15): 1 via ORAL
  Filled 2015-03-17 (×16): qty 1

## 2015-03-17 MED ORDER — AMLODIPINE BESYLATE 10 MG PO TABS
10.0000 mg | ORAL_TABLET | Freq: Every day | ORAL | Status: DC
  Administered 2015-03-18 – 2015-04-01 (×15): 10 mg via ORAL
  Filled 2015-03-17 (×18): qty 1

## 2015-03-17 MED ORDER — ENSURE ENLIVE PO LIQD: 237.0000 mL | Freq: Two times a day (BID) | ORAL | Status: DC

## 2015-03-17 MED ORDER — WARFARIN SODIUM 5 MG PO TABS: 5.0000 mg | ORAL_TABLET | Freq: Every day | ORAL | Status: DC

## 2015-03-17 MED ORDER — ESCITALOPRAM OXALATE 5 MG PO TABS
5.0000 mg | ORAL_TABLET | Freq: Every day | ORAL | Status: DC
  Administered 2015-03-17 – 2015-03-31 (×15): 5 mg via ORAL
  Filled 2015-03-17 (×16): qty 1

## 2015-03-17 MED ORDER — WARFARIN SODIUM 5 MG PO TABS: 5.0000 mg | ORAL_TABLET | ORAL | Status: DC

## 2015-03-17 MED ORDER — SORBITOL 70 % SOLN
30.0000 mL | Freq: Every day | Status: DC | PRN
  Administered 2015-03-30: 30 mL via ORAL
  Filled 2015-03-17: qty 30

## 2015-03-17 NOTE — Discharge Summary (Signed)
Frank Mclean, is a 74 y.o. male  DOB 1941-03-08  MRN [id number].  Admission date:  03/13/2015  Admitting Physician  Lorretta Harp, MD  Discharge Date:  03/17/2015   Primary MD  [name], MD  Recommendations for primary care physician for things to follow:   Check CBC,  and two-view chest x-ray in a week  Monitor INR, BMP and diuretic dose closely. Check INR and BMP in 2 days   Admission Diagnosis  Altered mental state [R41.82] Weakness [R53.1] HCAP (healthcare-associated pneumonia) [J18.9]   Discharge Diagnosis  Altered mental state [R41.82] Weakness [R53.1] HCAP (healthcare-associated pneumonia) [J18.9]     Principal Problem:   HCAP (healthcare-associated pneumonia) Active Problems:   DM (diabetes mellitus), type 2 with renal complications   B12 deficiency   HLD (hyperlipidemia)   Cerebral artery occlusion with cerebral infarction   GERD   CKD (chronic kidney disease) stage 3, GFR 30-59 ml/min   BPH (benign prostatic hyperplasia)   Osteoarthritis   LBBB (left bundle branch block)   CAD (coronary artery disease)   COPD with bronchitis   Chronic venous insufficiency   Gait disorder   Anticoagulated on Coumadin   PAF (paroxysmal atrial fibrillation)   Hypothyroidism   Sepsis   ICD (implantable cardioverter-defibrillator) in place   Elevated troponin   Weakness generalized   Chronic systolic CHF (congestive heart failure)   Malnutrition of moderate degree      Past Medical History  Diagnosis Date  . Gout   . HTN (hypertension)   . Hyperlipidemia   . Osteoarthritis   . History of CVA (cerebrovascular accident)     Left pontine infarct July 2004; changed from Plavix to Coumadin in 2004 per MD at [facility]  . GERD (gastroesophageal reflux disease)   . DJD (degenerative joint disease)   . BPH  (benign prostatic hypertrophy)   . Peripheral vascular disease   . Bilateral leg ulcer     ACHILLES AREA--  NONHEALING  . CKD (chronic kidney disease) stage 3, GFR 30-59 ml/min   . CAD (coronary artery disease)     a. LHC 4/12: Mid LAD 25%, mid diagonal 30%, AV circumflex 40%, proximal OM 25%, distal RCA 40%  . Chronic combined systolic and diastolic heart failure     a. Echo 05/2013: EF 25-30%, diffuse HK, restrictive physiology, trivial AI, mild MR, moderate LAE, reduced RV systolic function, PASP 42  . LBBB (left bundle branch block)   . PAF (paroxysmal atrial fibrillation)     a. amiodarone Rx started 05/2013;  b. chronic coumadin  . NICM (nonischemic cardiomyopathy)     a. EF 25-30%.  [name] Hx of cardiovascular stress test     Nuclear Stress Test (1/16): High risk stress nuclear study with a large, severe, partially reversible inferior and apical defect consistent with prior inferior and apical infarct; mild apical ischemia; severe LVE; study high risk due to reduced LV function.  EF 25% >> reviewed with Dr. Antoine Poche >> medical mgmt  . Hypothyroidism   . DM (  diabetes mellitus), type 2     Past Surgical History  Procedure Laterality Date  . Cardiac catheterization  04-16-2011   DR [facility]    NON-OBSTRUCTIVE CAD. MILDLY ELEVATED PULMONARY PRESSURES/ ELEVATED  END-DIASTOLIC PRESSURE  . Transthoracic echocardiogram  12-31-2010    MODERATE CONCENTRIC LVH/ SYSTOLIC FUNCTION SEVERELY REDUCED/ EF 25-30%/  SEVERE HYPOKINESIS OF ANTEROSEPTAL MYOCARDIUM  AND ENTIREAPICAL MYOCARDIUM /  MODERATE HYPOKINESIS OF LATERAL, INFEROLATERAL, INFERIOR,AND INFEROSEPTAL MYOCARIUM/  GRADE 3 DIASTOLIC DYSFUNCTION/ MILD MR  . Aortogram w/ bilateral lower extremitiy runoff  05-18-2013  DR [facility]EL[facility]    LEFT LEG OCCLUDED PERONEAL AND ANTERIOR TIBIAL ARTERIES/ HIGH GRADE STENOIS 80% MIDDLE AND DISTAL THIRD OF POSTERIOR TIBIAL ARTERY/ RIGHT PERONEAL AND ANTERIOR TIBIAL ARTERY OCCLUDED/ 40% STENOSIS DISTALLY   . Abdominal  aortagram N/A 05/18/2013    Procedure: ABDOMINAL Ronny Flurry;  Surgeon: Sherren Kerns, MD;  Location: [facility] CATH LAB;  Service: Cardiovascular;  Laterality: N/A;  . Lower extremity angiogram Bilateral 05/18/2013    Procedure: LOWER EXTREMITY ANGIOGRAM;  Surgeon: Sherren Kerns, MD;  Location: [facility] CATH LAB;  Service: Cardiovascular;  Laterality: Bilateral;  . Right heart catheterization N/A 02/13/2015    Procedure: RIGHT HEART CATH;  Surgeon: Laurey Morale, MD;  Location: [facility] CATH LAB;  Service: Cardiovascular;  Laterality: N/A;  . Bi-ventricular implantable cardioverter defibrillator N/A 03/12/2015    Procedure: BI-VENTRICULAR IMPLANTABLE CARDIOVERTER DEFIBRILLATOR  (CRT-D);  Surgeon: Marinus Maw, MD;  Location: [facility] CATH LAB;  Service: Cardiovascular;  Laterality: N/A;       History of present illness and  Hospital Course:     Kindly see H&P for history of present illness and admission details, please review complete Labs, Consult reports and Test reports for all details in brief  HPI  from the history and physical done on the day of admission  Frank Mclean is a 74 y.o. male with past medical history of hypertension, GERD, hyperlipidemia, gout, hypothyroidism, LBBB, PVD, chronic kidney disease-stage III, coronary artery disease, combined systolic and diastolic congestive heart failure (Ef 30 to 35% with grade 3 diastolic dysfunction), paroxysmal atrial fibrillation on Coumadin, diabetes mellitus, history of stroke, who presents with a generalized weakness.  Patient was recently hospitalized from 3/21-3/24/16 for pacemaker placement. He was discharged home yesterday morning. Patient reports that he has been feeling weak in the past 3 days, which has been progressively getting worse after he went home. Pt states that he is not able to ambulate on his own. He also has shortness of breath, nausea and vomiting. He vomited twice. He does not have cough. He has a fever and chills. Patient does not have  chest pain. Pt states that he wears oxygen at home nightly. Patient denies abdominal pain, diarrhea, dysuria, urgency, frequency, hematuria, skin rashes or leg swelling. No unilateral weakness, numbness or tingling sensations. No vision change or hearing loss. Pt seems to be mild confused per his wife according ED.  In ED, patient was found to have multifocal pneumonia on chest x-ray. Elevated troponin 0.22. Lactate 1.89, INR 2.88, negative urinalysis, WBC 14.9, temperature 100.2, tachycardia. Patient is admitted to inpatient for further evaluation and treatment.    Hospital Course    1.HCAP - improved, he is now symptom-free without any oxygen need for the last 2 days, he is not short of breath no cough, will be placed on Augmentin to be stopped on April 3. Question aspiration last admission, seen by speech currently on dysphagia diet 2 - Necter thick liquid with feeding  assistance and aspiration precautions. He will Require continued speech follow-up at SNF.   2. Chronic systolic heart failure EF 30-35%, has AICD. Discharged by cardiology yesterday. Currently compensated. Continue combination of Coreg, hydralazine, Imdur, resumed low-dose diuretic from 03-16-15. No ACE/ARB or Aldactone due to CK D. Request SNF M.D. to please monitor diuretic dose and BMP closely. Check BMP in 2-3 days.    3. CAD. Has left bundle branch block. Mildly elevated troponin likely due to #1 above. Cardiology consulted. Chest pain-free. Further management per cardiology. Not on aspirin since he is on Coumadin. Continue beta blocker and statin for secondary prevention.    4. Paroxysmal atrial fibrillation. CHADS2VASC score is 9 - on Coumadin and Coreg continue. Pharmacy monitoring INR. INR today was 3.5. We'll request SNF to hold today's Coumadin. Check INR tomorrow and her chest Coumadin dose.   Recent Labs         5. 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 Monitor closely.   6. Bilateral dull ongoing  knee pain. Patient says this has been ongoing for the last 3-4 weeks on an intermittent basis. consistent with gout flare, uric acid elevated, exam is unremarkable, improved with Medrol Dosepak trial. We'll place on steroid taper upon discharge along 1 once a day colchicine.   7. Hypernatremia. Mildly dehydrated, resolved with D5W.   8. Anemia. Likely due to dilution along with mild iron deficiency, on oral iron at home which will be increased, no need for transfusion, will follow with PCP for age appropriate anemia workup.   9. Leukocytosis. This is due to the steroid use for gout. Clinically pneumonia is much improved            Discharge Condition: Stable   Follow UP  Follow-up Information    Follow up with [name], MD. Schedule an appointment as soon as possible for a visit in 1 week.   Specialty:  Internal Medicine   Contact information:   [address] [place] [address] [phone]       Follow up with [facility] Schedule an appointment as soon as possible for a visit in 1 week.   Contact information:   [address] [place] [address]         Discharge Instructions  and  Discharge Medications          Discharge Instructions    Discharge instructions    Complete by:  As directed   Monitor INR every 3 days  Follow with Primary MD [name], MD in 7 days   Get CBC, CMP, 2 view Chest X ray checked  by SNF MD or Primary MD next visit.    Activity: As tolerated with Full fall precautions use walker/cane & assistance as needed   Disposition SNF   Diet: Dysphagia 2 Nectar thick liquids with full feeding assistance and aspiration precautions. Must follow with speech therapy closely at SNF.  For Heart failure patients - Check your Weight same time everyday, if you gain over 2 pounds, or you develop in leg swelling, experience more shortness of breath or chest pain, call your Primary MD immediately.  Follow Cardiac Low Salt Diet and 1.5 lit/day fluid restriction.   On your next visit with your primary care physician please Get Medicines reviewed and adjusted.   Please request your Prim.MD to go over all Hospital Tests and Procedure/Radiological results at the follow up, please get all Hospital records sent to your Prim MD  by signing hospital release before you go home.   If you experience worsening of your admission symptoms, develop shortness of breath, life threatening emergency, suicidal or homicidal thoughts you must seek medical attention immediately by calling 911 or calling your MD immediately  if symptoms less severe.  You Must read complete instructions/literature along with all the possible adverse reactions/side effects for all the Medicines you take and that have been prescribed to you. Take any new Medicines after you have completely understood and accpet all the possible adverse reactions/side effects.   Do not drive, operating heavy machinery, perform activities at heights, swimming or participation in water activities or provide baby sitting services if your were admitted for syncope or siezures until you have seen by Primary MD or a Neurologist and advised to do so again.  Do not drive when taking Pain medications.    Do not take more than prescribed Pain, Sleep and Anxiety Medications  Special Instructions: If you have smoked or chewed Tobacco  in the last 2 yrs please stop smoking, stop any regular Alcohol  and or any Recreational drug use.  Wear Seat belts while driving.   Please note  You were cared for by a hospitalist during your hospital stay. If you have any questions about your discharge medications or the care you received while you were in the hospital after you are discharged, you can call the unit and asked to speak with the hospitalist on call if the hospitalist that took care of you is not available. Once you are discharged, your primary care physician  will handle any further medical issues. Please note that NO REFILLS for any discharge medications will be authorized once you are discharged, as it is imperative that you return to your primary care physician (or establish a relationship with a primary care physician if you do not have one) for your aftercare needs so that they can reassess your need for medications and monitor your lab values.     Increase activity slowly    Complete by:  As directed             Medication List    TAKE these medications        amLODipine 10 MG tablet  Commonly known as:  NORVASC  Take 10 mg by mouth daily.     amoxicillin-clavulanate 500-125 MG per tablet  Commonly known as:  AUGMENTIN  Take 1 tablet (500 mg total) by mouth 3 (three) times daily.     atorvastatin 20 MG tablet  Commonly known as:  LIPITOR  Take 1 tablet (20 mg total) by mouth daily.     carvedilol 12.5 MG tablet  Commonly known as:  COREG  Take 1 tablet (12.5 mg total) by mouth 2 (two) times daily with a meal.     cholecalciferol 1000 UNITS tablet  Commonly known as:  VITAMIN D  Take 1,000 Units by mouth daily.     colchicine 0.6 MG tablet  Take 1 tablet (0.6 mg total) by mouth daily.     [employer] VITAMIN B12 1000 MCG tablet  Generic drug:  cyanocobalamin  TAKE 1 TABLET BY MOUTH DAILY     escitalopram 5 MG tablet  Commonly known as:  LEXAPRO  Take 1 tablet (5 mg total) by mouth daily.     feeding supplement (ENSURE ENLIVE) Liqd  Take 237 mLs by mouth 2 (two) times daily between meals.     ferrous sulfate 325 (65 FE) MG tablet  TAKE 1  TABLET BY MOUTH EVERY DAY     glipiZIDE 5 MG tablet  Commonly known as:  GLUCOTROL  TAKE 1/2 TABLET BY MOUTH TWICE A DAY BEFORE A MEAL     hydrALAZINE 100 MG tablet  Commonly known as:  APRESOLINE  Take 1 tablet (100 mg total) by mouth 3 (three) times daily.     isosorbide mononitrate 60 MG 24 hr tablet  Commonly known as:  IMDUR  Take 1.5 tablets for 90 mg by mouth twice a day.       OXYGEN  - Inhale 2.5 L into the lungs See admin instructions. USES OXYGEN EVERY BEDTIME  - USES DURING DAY ONLY AS NEEDED     potassium chloride SA 20 MEQ tablet  Commonly known as:  KLOR-CON M20  Take 1 tabs TWICE DAILY     predniSONE 5 MG tablet  Commonly known as:  DELTASONE  Take 3 pills for 3 days, then 2 pills for 3 days, take 1 pill for 3 days and stop     SYMBICORT 160-4.5 MCG/ACT inhaler  Generic drug:  budesonide-formoterol  INHALE 2 PUFFS INTO THE LUNGS 2 TIMES DAILY     SYNTHROID 100 MCG tablet  Generic drug:  levothyroxine  TAKE 2 TABLETS BY MOUTH EVERY MORNING BEFORE BREAKFAST     torsemide 20 MG tablet  Commonly known as:  DEMADEX  Take 1 tablet (20 mg total) by mouth daily. Please take 20mg  in the AM and 40 in the PM     warfarin 5 MG tablet  Commonly known as:  COUMADIN  Take 1 tablet (5 mg total) by mouth daily at 6 PM.  Start taking on:  03/18/2015          Diet and Activity recommendation: See Discharge Instructions above   Consults obtained - Cardiology   Major procedures and Radiology Reports - PLEASE review detailed and final reports for all details, in brief -       Dg Chest 2 View  03/14/2015   CLINICAL DATA:  Acute onset of generalized weakness and shortness of breath. Initial encounter.  EXAM: CHEST  2 VIEW  COMPARISON:  Chest radiograph performed 03/13/2015  FINDINGS: The lungs are well-aerated. Mild vascular congestion is noted. Mild left basilar opacity may reflect atelectasis or possibly minimal interstitial edema. There is no evidence of pleural effusion or pneumothorax.  The heart is enlarged. A pacemaker/AICD is noted at the left chest wall, with leads ending at the right atrium and right ventricle. No acute osseous abnormalities are seen.  IMPRESSION: Mild vascular congestion and cardiomegaly noted. Mild left basilar airspace opacity may reflect atelectasis or possibly minimal interstitial edema.   Electronically Signed   By: Roanna Raider M.D.   On: 03/14/2015 00:28   Dg Chest 2 View  03/13/2015   CLINICAL DATA:  74 year old male status post pacemaker placement  EXAM: CHEST  2 VIEW  COMPARISON:  Preoperative chest x-ray 03/10/2015  FINDINGS: New left subclavian approach cardiac rhythm maintenance device. Leads project over the right atrium and right ventricle. No evidence of pneumothorax or hemothorax. Stable cardiomegaly. Stable mediastinal contours. Atherosclerotic calcifications in the aorta. Pulmonary hyperexpansion and central bronchitic change. No pulmonary edema. No acute osseous abnormality.  IMPRESSION: 1. New left subclavian approach cardiac rhythm maintenance device with leads projecting over the right atrium and right ventricle. 2. No evidence of pneumothorax, hemothorax or other complication.   Electronically Signed   By: Malachy Moan M.D.   On: 03/13/2015 07:43  Dg Chest 2 View  03/10/2015   CLINICAL DATA:  Acute onset of shortness of breath. Initial encounter.  EXAM: CHEST  2 VIEW  COMPARISON:  Chest radiograph performed 11/27/2013  FINDINGS: The lungs are well-aerated. Vascular congestion is noted, with bilateral central airspace opacities and a small left pleural effusion, likely reflecting mild recurrent pulmonary edema. No pneumothorax is seen.  The heart is enlarged.  No acute osseous abnormalities are seen.  IMPRESSION: Vascular congestion and cardiomegaly, with bilateral central airspace opacities and a small left pleural effusion, likely reflecting mild recurrent pulmonary edema.   Electronically Signed   By: Roanna Raider M.D.   On: 03/10/2015 22:54   Ct Head Wo Contrast  03/14/2015   CLINICAL DATA:  Acute onset of generalized weakness. Initial encounter.  EXAM: CT HEAD WITHOUT CONTRAST  TECHNIQUE: Contiguous axial images were obtained from the base of the skull through the vertex without intravenous contrast.  COMPARISON:  CT of the head performed 09/13/2013  FINDINGS: There is no evidence of acute  infarction, mass lesion, or intra- or extra-axial hemorrhage on CT.  Prominence of the ventricles and sulci reflects mild to moderate cortical volume loss. Cerebellar atrophy is noted. Scattered periventricular and subcortical white matter change likely reflects small vessel ischemic microangiopathy. A prominent chronic lacunar infarct is noted at the right thalamus, and a chronic lacunar infarct is seen at the left basal ganglia.  The brainstem and fourth ventricle are within normal limits. The cerebral hemispheres demonstrate grossly normal gray-white differentiation. No mass effect or midline shift is seen.  There is no evidence of fracture; visualized osseous structures are unremarkable in appearance. The orbits are within normal limits. The paranasal sinuses and mastoid air cells are well-aerated. No significant soft tissue abnormalities are seen.  IMPRESSION: 1. No acute intracranial pathology seen on CT. 2. Mild to moderate cortical volume loss and scattered small vessel ischemic microangiopathy. 3. 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   Electronically Signed   By: Roanna Raider M.D.   On: 03/14/2015 00:48   Ct Chest Wo Contrast  03/14/2015   CLINICAL DATA:  Acute onset of shortness of breath and generalized weakness. Initial encounter.  EXAM: CT CHEST WITHOUT CONTRAST  TECHNIQUE: Multidetector CT imaging of the chest was performed following the standard protocol without IV contrast.  COMPARISON:  Chest radiograph performed earlier today at 12:03 a.m.  FINDINGS: Patchy bibasilar airspace opacities are noted at the lung bases, in a peribronchovascular distribution, and at the left lingula. This is concerning for pneumonia. The lungs are otherwise clear. No pleural effusion or pneumothorax is seen. No masses are identified.  Scattered coronary artery calcifications are seen. The heart is mildly enlarged. A pacemaker is noted at the left chest wall, with leads ending at the  right atrium and right ventricle. No pericardial effusion is identified. No mediastinal lymphadenopathy is seen. Scattered calcification is noted along the aortic arch. The great vessels are grossly unremarkable in appearance. The thyroid gland is within normal limits. No axillary lymphadenopathy is seen.  The visualized portions of the liver and spleen are unremarkable, aside from a calcified granuloma at the right hepatic lobe. Mildly increased attenuation within the gallbladder is nonspecific; no definite stones are seen. The visualized portions of the pancreas and adrenal glands are within normal limits. A 4.6 cm left renal cyst is noted. Mild calcification is noted along the proximal abdominal aorta  No acute osseous abnormalities are seen. Mild degenerative change is noted  at the lower cervical spine.  IMPRESSION: 1. Patchy bibasilar airspace opacities at the lung bases, in a peribronchovascular distribution, and at the left lingula. This is concerning for multifocal pneumonia. 2. Scattered coronary artery calcifications seen. Mild cardiomegaly noted. 3. Left renal cyst noted. 4. Mild calcification along the proximal abdominal aorta.   Electronically Signed   By: Roanna Raider M.D.   On: 03/14/2015 02:32    Micro Results      Recent Results (from the past 240 hour(s))  Culture, blood (routine x 2)     Status: None (Preliminary result)   Collection Time: 03/13/15 11:47 PM  Result Value Ref Range Status   Specimen Description BLOOD LEFT HAND  Final   Special Requests BOTTLES DRAWN AEROBIC AND ANAEROBIC 5CC EACH  Final   Culture   Final           BLOOD CULTURE RECEIVED NO GROWTH TO DATE CULTURE WILL BE HELD FOR 5 DAYS BEFORE ISSUING A FINAL NEGATIVE REPORT Performed at [facility]    Report Status PENDING  Incomplete  Culture, blood (routine x 2)     Status: None (Preliminary result)   Collection Time: 03/13/15 11:55 PM  Result Value Ref Range Status   Specimen Description BLOOD  LEFT WRIST  Final   Special Requests BOTTLES DRAWN AEROBIC AND ANAEROBIC 5CC EACH  Final   Culture   Final           BLOOD CULTURE RECEIVED NO GROWTH TO DATE CULTURE WILL BE HELD FOR 5 DAYS BEFORE ISSUING A FINAL NEGATIVE REPORT Performed at [facility]    Report Status PENDING  Incomplete       Today   Subjective:   Frank Mclean today has no headache,no chest abdominal pain,no new weakness tingling or numbness, feels much better.   Objective:   Blood pressure 128/76, pulse 59, temperature 98.2 F (36.8 C), temperature source Oral, resp. rate 18, height 6\' 2"  (1.88 m), weight 95.5 kg (210 lb 8.6 oz), SpO2 96 %.   Intake/Output Summary (Last 24 hours) at 03/17/15 0952 Last data filed at 03/17/15 0929  Gross per 24 hour  Intake    940 ml  Output   1100 ml  Net   -160 ml    Exam Awake Alert, Oriented x 3, No new F.N deficits, Normal affect [place].AT,PERRAL Supple Neck,No JVD, No cervical lymphadenopathy appriciated.  Symmetrical Chest wall movement, Good air movement bilaterally, CTAB RRR,No Gallops,Rubs or new Murmurs, No Parasternal Heave +ve B.Sounds, Abd Soft, Non tender, No organomegaly appriciated, No rebound -guarding or rigidity. No Cyanosis, Clubbing or edema, No new Rash or bruise  Data Review   CBC w Diff:  Lab Results  Component Value Date   WBC 13.4* 03/16/2015   HGB 8.9* 03/16/2015   HCT 29.5* 03/16/2015   PLT 188 03/16/2015   LYMPHOPCT 4* 03/13/2015   MONOPCT 7 03/13/2015   EOSPCT 0 03/13/2015   BASOPCT 0 03/13/2015    CMP:  Lab Results  Component Value Date   NA 141 03/17/2015   K 4.2 03/17/2015   CL 108 03/17/2015   CO2 23 03/17/2015   BUN 42* 03/17/2015   CREATININE 2.57* 03/17/2015   PROT 5.8* 03/13/2015   ALBUMIN 3.1* 03/13/2015   BILITOT 0.8 03/13/2015   ALKPHOS 70 03/13/2015   AST 17 03/13/2015   ALT 7 03/13/2015  .   Total Time in preparing paper work, data evaluation and todays exam - 35 minutes  SINGH,PRASHANT K  M.D on 03/17/2015 at 9:52 AM  [facility]   Office  [phone]

## 2015-03-17 NOTE — Progress Notes (Signed)
ANTIBIOTIC CONSULT NOTE - FOLLOW UP  Pharmacy Consult for Cefepime/Vanco, Coumadin Indication: Sepsis + Coumadin  Allergies  Allergen Reactions  . Levothyroxine Sodium Other (See Comments)    MUST TAKE BRAND NAME GENERIC DOESN'T WORK FOR PATIENT  . Chicken Protein     Does not like chicken or [place]  . Eggs Or Egg-Derived Products     Does not like eggs    Patient Measurements: Height: 6\' 2"  (188 cm) Weight: 210 lb 8.6 oz (95.5 kg) IBW/kg (Calculated) : 82.2 Adjusted Body Weight:   Vital Signs: Temp: 98.2 F (36.8 C) (03/28 0531) Temp Source: Oral (03/28 0531) BP: 128/76 mmHg (03/28 0531) Pulse Rate: 59 (03/28 0531) Intake/Output from previous day: 03/27 0701 - 03/28 0700 In: 940 [P.O.:940] Out: 1250 [Urine:1250] Intake/Output from this shift: Total I/O In: 120 [P.O.:120] Out: -   Labs:  Recent Labs  03/14/15 1126 03/15/15 0553 03/16/15 0800 03/17/15 0756  WBC  --  12.7* 13.4*  --   HGB  --  8.8* 8.9*  --   PLT  --  198 188  --   LABCREA 144.36  --   --   --   CREATININE  --  2.71* 2.74* 2.57*   Estimated Creatinine Clearance: 28.9 mL/min (by C-G formula based on Cr of 2.57). No results for input(s): VANCOTROUGH, VANCOPEAK, VANCORANDOM, GENTTROUGH, GENTPEAK, GENTRANDOM, TOBRATROUGH, TOBRAPEAK, TOBRARND, AMIKACINPEAK, AMIKACINTROU, AMIKACIN in the last 72 hours.   Microbiology: Recent Results (from the past 720 hour(s))  Culture, blood (routine x 2)     Status: None (Preliminary result)   Collection Time: 03/13/15 11:47 PM  Result Value Ref Range Status   Specimen Description BLOOD LEFT HAND  Final   Special Requests BOTTLES DRAWN AEROBIC AND ANAEROBIC 5CC EACH  Final   Culture   Final           BLOOD CULTURE RECEIVED NO GROWTH TO DATE CULTURE WILL BE HELD FOR 5 DAYS BEFORE ISSUING A FINAL NEGATIVE REPORT Performed at [facility]    Report Status PENDING  Incomplete  Culture, blood (routine x 2)     Status: None (Preliminary result)   Collection Time: 03/13/15 11:55 PM  Result Value Ref Range Status   Specimen Description BLOOD LEFT WRIST  Final   Special Requests BOTTLES DRAWN AEROBIC AND ANAEROBIC 5CC EACH  Final   Culture   Final           BLOOD CULTURE RECEIVED NO GROWTH TO DATE CULTURE WILL BE HELD FOR 5 DAYS BEFORE ISSUING A FINAL NEGATIVE REPORT Performed at [facility]    Report Status PENDING  Incomplete    Anti-infectives    Start     Dose/Rate Route Frequency Ordered Stop   03/15/15 0500  ceFEPIme (MAXIPIME) 1 g in dextrose 5 % 50 mL IVPB     1 g 100 mL/hr over 30 Minutes Intravenous Every 24 hours 03/14/15 0455     03/15/15 0400  vancomycin (VANCOCIN) IVPB 1000 mg/200 mL premix     1,000 mg 200 mL/hr over 60 Minutes Intravenous Every 24 hours 03/14/15 0455     03/14/15 1000  ceFEPIme (MAXIPIME) 2 g in dextrose 5 % 50 mL IVPB  Status:  Discontinued     2 g 100 mL/hr over 30 Minutes Intravenous Every 12 hours 03/14/15 0455 03/14/15 0457   03/14/15 0315  vancomycin (VANCOCIN) 1,500 mg in sodium chloride 0.9 % 500 mL IVPB     1,500 mg 250 mL/hr over 120  Minutes Intravenous  Once 03/14/15 0240 03/14/15 0532   03/14/15 0245  ceFEPIme (MAXIPIME) 2 g in dextrose 5 % 50 mL IVPB     2 g 100 mL/hr over 30 Minutes Intravenous  Once 03/14/15 0240 03/14/15 0332      Assessment: 74yo male was discharged on 3/23 for pacer/defib placement, now c/o weakness. CXR shows mild L basilar airspace opacity, to begin IV ABX; also to continue Coumadin for Afib with INR 2.56  Anticoag: Coumadin for afib; goal INR 2-3. INR today 3.25. Hgb 8.9 and Plt 188 K  ID: Vanco/Cefepime for r/o sepsis / HCAP. WBC 13.4 (on steroids). PNA improved  03/24 blood cx >> NGTD  Vanco 3/25>> Cefepime 3/25>>  Card:Hx of HTN, HLD, CAD, HF 30-35%. VSS (HR59) On norvasc10, Lipitor20, coreg, hydralazine, imdur, Demadex  Endo/GI: hx of DM and hypothyroidism. On SSI and synthroid. CBG 192-229 Meds: Synthroid, SSI, Vit D, Ensure,  MV, Miralax, B12, Iron, solumedrol for possibly gout.  Neuro: hx of CVA in 2004  Hem/Onc: see AC  Renal: hx of stage 3 CKD; SCr 2.57  Home meds: glipizide  Best practice: coumadin   Goal of Therapy:  Vancomycin trough level 15-20 mcg/ml  INR 2-3  Plan:  Change Coumadin to Coumadin  daily except 2.5mg  Mon/Fri. Vanco trough in AM   Caidynce Muzyka S. Merilynn Finland, PharmD, BCPS Clinical Staff Pharmacist Pager [phone]  [name] Stillinger 03/17/2015,9:32 AM

## 2015-03-17 NOTE — Progress Notes (Signed)
Physical Medicine and Rehabilitation Consult Reason for Consult: Debilitation/pneumonia/multi-medical Referring Physician: Triad   HPI: Frank Mclean is a 74 y.o. right handed male with history of chronic renal insufficiency with baseline creatinine 2.28-2.75, long-standing nonischemic cardiomyopathy, systolic congestive heart failure, left bundle branch block, atrial fibrillation with chronic Coumadin. Patient lives with wife used a Youth worker prior to admission. He was recently seen in the office of cardiology services and defibrillator implant was recommended and scheduled for mid April. Recent admission 03/10/2015 with acute on chronic heart failure improved with diuresis and discharged home 03/13/2015. Attempts had been made with recent admission for Med[employer] ICD placement however unable to perform due to lack of appropriate veins and appointment was to be made as outpatient for follow-up. He was discharged to home with noted increasing shortness of breath over the past 2 days as well as bouts of vomiting. Patient also with recent fall while at home. He was readmitted for ongoing evaluation 03/14/2015. Cranial CT scan negative for acute changes. CT of the chest with patchy bibasal or air space opacities concerning for multifocal pneumonia. Placed on broad-spectrum antibiotics. Patient remains on chronic Coumadin therapy. Cardiology to follow-up on current plan for defibrillator. Physical and occupational therapy evaluation completed with recommendations of physical medicine rehabilitation consult.   Review of Systems  Cardiovascular: Positive for palpitations.  Gastrointestinal:   GERD  Genitourinary: Positive for urgency.  Musculoskeletal: Positive for myalgias.  Psychiatric/Behavioral: Positive for depression.  All other systems reviewed and are negative.  Past Medical History  Diagnosis Date  . Gout   . HTN (hypertension)   . Hyperlipidemia   . Osteoarthritis   .  History of CVA (cerebrovascular accident)     Left pontine infarct July 2004; changed from Plavix to Coumadin in 2004 per MD at [facility]  . GERD (gastroesophageal reflux disease)   . DJD (degenerative joint disease)   . BPH (benign prostatic hypertrophy)   . Peripheral vascular disease   . Bilateral leg ulcer     ACHILLES AREA-- NONHEALING  . CKD (chronic kidney disease) stage 3, GFR 30-59 ml/min   . CAD (coronary artery disease)     a. LHC 4/12: Mid LAD 25%, mid diagonal 30%, AV circumflex 40%, proximal OM 25%, distal RCA 40%  . Chronic combined systolic and diastolic heart failure     a. Echo 05/2013: EF 25-30%, diffuse HK, restrictive physiology, trivial AI, mild MR, moderate LAE, reduced RV systolic function, PASP 42  . LBBB (left bundle branch block)   . PAF (paroxysmal atrial fibrillation)     a. amiodarone Rx started 05/2013; b. chronic coumadin  . NICM (nonischemic cardiomyopathy)     a. EF 25-30%.  [name] Hx of cardiovascular stress test     Nuclear Stress Test (1/16): High risk stress nuclear study with a large, severe, partially reversible inferior and apical defect consistent with prior inferior and apical infarct; mild apical ischemia; severe LVE; study high risk due to reduced LV function. EF 25% >> reviewed with Dr. Antoine Poche >> medical mgmt  . Hypothyroidism   . DM (diabetes mellitus), type 2    Past Surgical History  Procedure Laterality Date  . Cardiac catheterization  04-16-2011 DR [facility]    NON-OBSTRUCTIVE CAD. MILDLY ELEVATED PULMONARY PRESSURES/ ELEVATED END-DIASTOLIC PRESSURE  . Transthoracic echocardiogram  12-31-2010    MODERATE CONCENTRIC LVH/ SYSTOLIC FUNCTION SEVERELY REDUCED/ EF 25-30%/ SEVERE HYPOKINESIS OF ANTEROSEPTAL MYOCARDIUM AND ENTIREAPICAL MYOCARDIUM / MODERATE HYPOKINESIS OF LATERAL, INFEROLATERAL, INFERIOR,AND INFEROSEPTAL MYOCARIUM/ GRADE 3 DIASTOLIC DYSFUNCTION/  MILD MR  . Aortogram w/ bilateral lower extremitiy runoff  05-18-2013 DR FIELDS    LEFT LEG OCCLUDED PERONEAL AND ANTERIOR TIBIAL ARTERIES/ HIGH GRADE STENOIS 80% MIDDLE AND DISTAL THIRD OF POSTERIOR TIBIAL ARTERY/ RIGHT PERONEAL AND ANTERIOR TIBIAL ARTERY OCCLUDED/ 40% STENOSIS DISTALLY   . Abdominal aortagram N/A 05/18/2013    Procedure: ABDOMINAL Ronny Flurry; Surgeon: Sherren Kerns, MD; Location: [facility] CATH LAB; Service: Cardiovascular; Laterality: N/A;  . Lower extremity angiogram Bilateral 05/18/2013    Procedure: LOWER EXTREMITY ANGIOGRAM; Surgeon: Sherren Kerns, MD; Location: [facility] CATH LAB; Service: Cardiovascular; Laterality: Bilateral;  . Right heart catheterization N/A 02/13/2015    Procedure: RIGHT HEART CATH; Surgeon: Laurey Morale, MD; Location: [facility] CATH LAB; Service: Cardiovascular; Laterality: N/A;  . Bi-ventricular implantable cardioverter defibrillator N/A 03/12/2015    Procedure: BI-VENTRICULAR IMPLANTABLE CARDIOVERTER DEFIBRILLATOR (CRT-D); Surgeon: Marinus Maw, MD; Location: [facility] CATH LAB; Service: Cardiovascular; Laterality: N/A;   Family History  Problem Relation Age of Onset  . Hypertension Mother   . Heart disease Mother   . Diabetes Mother   . Gout Other   . Stroke Other   . Hypertension Father   . Heart disease Father   . Heart attack Maternal Grandmother   . Thyroid disease Neg Hx    Social History:  reports that he quit smoking about 40 years ago. He has never used smokeless tobacco. He reports that he does not drink alcohol or use illicit drugs. Allergies:  Allergies  Allergen Reactions  . Levothyroxine Sodium Other (See Comments)    MUST TAKE BRAND NAME GENERIC DOESN'T WORK FOR PATIENT  . Chicken Protein     Does not like chicken or [place]  . Eggs Or Egg-Derived Products     Does not like eggs   Medications Prior to Admission  Medication  Sig Dispense Refill  . amLODipine (NORVASC) 10 MG tablet Take 10 mg by mouth daily.    [name] atorvastatin (LIPITOR) 20 MG tablet Take 1 tablet (20 mg total) by mouth daily. 90 tablet 3  . carvedilol (COREG) 12.5 MG tablet Take 1 tablet (12.5 mg total) by mouth 2 (two) times daily with a meal. 60 tablet 11  . cholecalciferol (VITAMIN D) 1000 UNITS tablet Take 1,000 Units by mouth daily.    . CVS VITAMIN B12 1000 MCG tablet TAKE 1 TABLET BY MOUTH DAILY 100 tablet 1  . escitalopram (LEXAPRO) 5 MG tablet Take 1 tablet (5 mg total) by mouth daily. (Patient taking differently: Take 5 mg by mouth at bedtime. ) 30 tablet 5  . ferrous sulfate 325 (65 FE) MG tablet TAKE 1 TABLET BY MOUTH EVERY DAY 30 tablet 6  . glipiZIDE (GLUCOTROL) 5 MG tablet TAKE 1/2 TABLET BY MOUTH TWICE A DAY BEFORE A MEAL 30 tablet 11  . hydrALAZINE (APRESOLINE) 100 MG tablet Take 1 tablet (100 mg total) by mouth 3 (three) times daily. 90 tablet 11  . isosorbide mononitrate (IMDUR) 60 MG 24 hr tablet Take 1.5 tablets for 90 mg by mouth twice a day. (Patient taking differently: Take 90 mg by mouth 2 (two) times daily. Take 1.5 tablets for 90 mg by mouth twice a day.) 45 tablet 11  . potassium chloride SA (KLOR-CON M20) 20 MEQ tablet Take 3 tabs TWICE DAILY (Patient taking differently: Take 60 mEq by mouth 2 (two) times daily. ) 180 tablet 6  . SYMBICORT 160-4.5 MCG/ACT inhaler INHALE 2 PUFFS INTO THE LUNGS 2 TIMES DAILY 10.2 g 5  . SYNTHROID 100 MCG  tablet TAKE 2 TABLETS BY MOUTH EVERY MORNING BEFORE BREAKFAST 60 tablet 11  . torsemide (DEMADEX) 20 MG tablet Take 1-2 tablets (20-40 mg total) by mouth 2 (two) times daily. Please take 20mg  in the AM and 40 in the PM 90 tablet 11  . warfarin (COUMADIN) 5 MG tablet TAKE 1 TABLET (5 MG TOTAL) BY MOUTH AS DIRECTED. 40 tablet 2  . OXYGEN Inhale 2.5 L into the lungs See admin instructions. USES OXYGEN EVERY BEDTIME USES  DURING DAY ONLY AS NEEDED      Home: Home Living Family/patient expects to be discharged to:: Private residence Living Arrangements: Spouse/significant other Available Help at Discharge: Family Type of Home: House Home Access: Stairs to enter Secretary/administrator of Steps: 5 Entrance Stairs-Rails: Right, Left, Can reach both Home Layout: Multi-level Alternate Level Stairs-Number of Steps: 13 Alternate Level Stairs-Rails: Left Home Equipment: Walker - 2 wheels, Cane - single point, Banker History: Prior Function Level of Independence: Needs assistance Gait / Transfers Assistance Needed: Pt reports he ambulated mod I ADL's / Homemaking Assistance Needed: Pt reports he jsut started showering again after bil. leg injury ~17 mos ago. He states wife has to assist him with socks, but he is able to perform bathing and dressing, "but it takes some effort" Functional Status:  Mobility: Bed Mobility Overal bed mobility: Needs Assistance Bed Mobility: Supine to Sit, Sit to Supine Supine to sit: Mod assist Sit to supine: Min assist General bed mobility comments: Assist for bil. LEs  Transfers Overall transfer level: Needs assistance Equipment used: 1 person hand held assist, Rolling walker (2 wheeled) Transfers: Sit to/from Stand, [employer] Transfers Sit to Stand: Total assist (x3 attempt and only attained semi-upright position with face) Squat pivot transfers: Total assist General transfer comment: Unable to move sit to stand with +1 assist  Ambulation/Gait General Gait Details: unable    ADL: ADL Overall ADL's : Needs assistance/impaired Eating/Feeding: Independent, Bed level Grooming: Wash/dry hands, Wash/dry face, Oral care, Brushing hair, Set up, Sitting Upper Body Bathing: Set up, Supervision/ safety, Sitting, Bed level Lower Body Bathing: Maximal assistance, Bed level Upper Body Dressing : Minimal assistance, Sitting Lower Body Dressing: Total  assistance, Bed level Toilet Transfer: Total assistance (unable to attempt safely with +1 assist ) Toileting- Clothing Manipulation and Hygiene: Total assistance, Bed level Functional mobility during ADLs: Total assistance General ADL Comments: Pt moved to EOB. Pain 9/10 Rt knee. Unable to move into standing. Unable to access bil. LEs   Cognition: Cognition Overall Cognitive Status: Within Functional Limits for tasks assessed Orientation Level: Oriented X4 Cognition Arousal/Alertness: Awake/alert Behavior During Therapy: WFL for tasks assessed/performed Overall Cognitive Status: Within Functional Limits for tasks assessed  Blood pressure 128/76, pulse 59, temperature 98.2 F (36.8 C), temperature source Oral, resp. rate 18, height 6\' 2"  (1.88 m), weight 95.5 kg (210 lb 8.6 oz), SpO2 96 %. Physical Exam  Constitutional: He is oriented to person, place, and time. He appears well-developed.  HENT:  Head: Normocephalic.  Eyes: EOM are normal.  Neck: Normal range of motion. Neck supple. No thyromegaly present.  Cardiovascular:  Cardiac rate controlled  Respiratory:  Lungs decreased breath sounds at the bases but clear to auscultation  GI: Soft. Bowel sounds are normal. He exhibits no distension.  Neurological: He is alert and oriented to person, place, and time.  Follow simple commands. Moves all 4's.  Skin: Skin is warm and dry.  Psychiatric: He has a normal mood and affect. His behavior  is normal. Thought content normal.     Lab Results Last 24 Hours    Results for orders placed or performed during the hospital encounter of 03/13/15 (from the past 24 hour(s))  Protime-INR Status: Abnormal   Collection Time: 03/16/15 8:00 AM  Result Value Ref Range   Prothrombin Time 30.8 (H) 11.6 - 15.2 seconds   INR 2.93 (H) 0.00 - 1.49  Basic metabolic panel Status: Abnormal   Collection Time: 03/16/15 8:00 AM  Result Value Ref Range   Sodium 141 135  - 145 mmol/L   Potassium 3.7 3.5 - 5.1 mmol/L   Chloride 108 96 - 112 mmol/L   CO2 23 19 - 32 mmol/L   Glucose, Bld 206 (H) 70 - 99 mg/dL   BUN 33 (H) 6 - 23 mg/dL   Creatinine, Ser 4.13 (H) 0.50 - 1.35 mg/dL   Calcium 8.7 8.4 - 24.4 mg/dL   GFR calc non Af Amer 21 (L) >90 mL/min   GFR calc Af Amer 25 (L) >90 mL/min   Anion gap 10 5 - 15  CBC Status: Abnormal   Collection Time: 03/16/15 8:00 AM  Result Value Ref Range   WBC 13.4 (H) 4.0 - 10.5 K/uL   RBC 3.50 (L) 4.22 - 5.81 MIL/uL   Hemoglobin 8.9 (L) 13.0 - 17.0 g/dL   HCT 01.0 (L) 27.2 - 53.6 %   MCV 84.3 78.0 - 100.0 fL   MCH 25.4 (L) 26.0 - 34.0 pg   MCHC 30.2 30.0 - 36.0 g/dL   RDW 64.4 03.4 - 74.2 %   Platelets 188 150 - 400 K/uL  Glucose, capillary Status: Abnormal   Collection Time: 03/16/15 8:15 AM  Result Value Ref Range   Glucose-Capillary 229 (H) 70 - 99 mg/dL  Glucose, capillary Status: Abnormal   Collection Time: 03/16/15 12:04 PM  Result Value Ref Range   Glucose-Capillary 232 (H) 70 - 99 mg/dL  Glucose, capillary Status: Abnormal   Collection Time: 03/16/15 4:49 PM  Result Value Ref Range   Glucose-Capillary 226 (H) 70 - 99 mg/dL  Glucose, capillary Status: Abnormal   Collection Time: 03/16/15 9:19 PM  Result Value Ref Range   Glucose-Capillary 190 (H) 70 - 99 mg/dL      Imaging Results (Last 48 hours)    No results found.    Assessment/Plan: Diagnosis: debility/gait disorder 1. Does the need for close, 24 hr/day medical supervision in concert with the patient's rehab needs make it unreasonable for this patient to be served in a less intensive setting? Yes 2. Co-Morbidities requiring supervision/potential complications: dm, ckd, gerd, lbbb, cad, copd 3. Due to bladder management, bowel management, safety, skin/wound care, disease management, medication  administration, pain management and patient education, does the patient require 24 hr/day rehab nursing? Yes 4. Does the patient require coordinated care of a physician, rehab nurse, PT (1-2 hrs/day, 5 days/week) and OT (1-2 hrs/day, 5 days/week) to address physical and functional deficits in the context of the above medical diagnosis(es)? Yes Addressing deficits in the following areas: balance, endurance, locomotion, strength, transferring, bowel/bladder control, bathing, dressing, feeding, grooming and psychosocial support 5. Can the patient actively participate in an intensive therapy program of at least 3 hrs of therapy per day at least 5 days per week? Yes 6. The potential for patient to make measurable gains while on inpatient rehab is excellent 7. Anticipated functional outcomes upon discharge from inpatient rehab are supervision and min assist with PT, supervision and min assist with OT, n/a  with SLP. 8. Estimated rehab length of stay to reach the above functional goals is: 13-17 days 9. Does the patient have adequate social supports and living environment to accommodate these discharge functional goals? Yes 10. Anticipated D/C setting: Home 11. Anticipated post D/C treatments: HH therapy 12. Overall Rehab/Functional Prognosis: excellent  RECOMMENDATIONS: This patient's condition is appropriate for continued rehabilitative care in the following setting: CIR Patient has agreed to participate in recommended program. Yes Note that insurance prior authorization may be required for reimbursement for recommended care.  Comment: Rehab Admissions Coordinator to follow up. Likely having some GERD vs esophageal dysmotility. May need to evaluate this further on inpatient rehab. Doesn't look like further SLP is needed after speaking to SLP today.  Thanks,  Ranelle Oyster, MD, [place] [facility]     03/17/2015       Revision History     Date/Time User Provider Type Action   03/17/2015 10:55 AM  Ranelle Oyster, MD Physician Sign   03/17/2015 7:18 AM Charlton Amor, PA-C Physician Assistant Pend   View Details Report       Routing History     Date/Time From To Method   03/17/2015 10:55 AM Ranelle Oyster, MD Ranelle Oyster, MD In Basket   03/17/2015 10:55 AM Ranelle Oyster, MD Tresa Garter, MD In Basket

## 2015-03-17 NOTE — Clinical Social Work Note (Signed)
CIR will admit patient today. Patient and patient's family prefer this option. CSW signing off at this time.   Roddie Mc MSW, [place], [place], [phone]

## 2015-03-17 NOTE — Progress Notes (Signed)
Speech Language Pathology Treatment: Dysphagia  Patient Details Name: Frank Mclean MRN: [id number] DOB: 1941/10/28 Today's Date: 03/17/2015 Time: 1010-1045 SLP Time Calculation (min) (ACUTE ONLY): 35 min  Assessment / Plan / Recommendation Clinical Impression  Dysphagia treatment provided today for diet tolerance/ advancement and education. Pt showed no overt s/s of aspiration with nectar thick or thin liquids. Pt did demonstrate prolonged mastication with regular solid and a delayed cough. Continue to ? esophageal component with this and with family member expressing concerns for a change in voice quality; expressed this to MD. Recommend upgrading liquids to thin and continuing dysphagia 2 diet. SLP will continue to follow for diet tolerance/ advancement.   HPI HPI: Frank Mclean is a 74 y.o. male with past medical history of hypertension, GERD, hyperlipidemia, gout, hypothyroidism, LBBB, PVD, chronic kidney disease-stage III, coronary artery disease, combined systolic and diastolic congestive heart failure paroxysmal atrial fibrillation on Coumadin, diabetes mellitus, history of stroke, who presents with a generalized weakness.Patient was recently hospitalized from 3/21-3/24/16 for pacemaker placement. Pt states that he is not able to ambulate on his own. He also has shortness of breath, nausea and vomiting. He vomited twice. In ED, patient was found to have multifocal pneumonia on chest x-ray. MD reports pna improved on 3/25. No history of being seen by SLP. Family reports pt with poor PO intake over several weeks, pt complains of difficutly swallowing.    Pertinent Vitals Pain Assessment: No/denies pain  SLP Plan  Continue with current plan of care    Recommendations Diet recommendations: Dysphagia 2 (fine chop);Thin liquid Liquids provided via: Cup;Straw Medication Administration: Whole meds with puree Supervision: Patient able to self feed;Intermittent supervision to cue for compensatory  strategies Compensations: Slow rate;Small sips/bites Postural Changes and/or Swallow Maneuvers: Seated upright 90 degrees;Upright 30-60 min after meal              Oral Care Recommendations: Oral care BID Follow up Recommendations: Inpatient Rehab Plan: Continue with current plan of care    GO     Metro Kung, MA, CCC-SLP 03/17/2015, 10:52 AM

## 2015-03-17 NOTE — Progress Notes (Signed)
I met with pt, his brother in law, Erlene Quan, and a daughter at bedside. We discussed an inpt rehab admission and pt is in agreement. I will make the arrangements for admit today. RN CM and SW are aware. [phone]

## 2015-03-17 NOTE — H&P (View-Only) (Signed)
Physical Medicine and Rehabilitation Admission H&P    Chief Complaint  Patient presents with  . Weakness  : HPI: Frank Mclean is a 74 y.o. right handed male with history of chronic renal insufficiency with baseline creatinine 2.28-2.75, long-standing nonischemic cardiomyopathy, systolic congestive heart failure, left bundle branch block, atrial fibrillation with chronic Coumadin. Patient lives with wife used a Programmer, multimedia prior to admission. He was recently seen in the office of cardiology services for evaluation of need for ICD defibrillator and scheduled. Recent admit 03/10/2015 with acute on chronic heart failure improved with diuresis as well as ICD defibrillator implantation but unable to place LV lead, no appropriate veins and discharged home 03/13/2015. [name] He was discharged to home with noted increasing shortness of breath over the past 2 days as well as bouts of vomiting with temperature of 100.2 and WBC 14.9. Patient also with recent fall while at home. He was readmitted for ongoing evaluation 03/14/2015. Cranial CT scan negative for acute changes. CT of the chest with patchy bibasal or air space opacities concerning for multifocal pneumonia. Placed on broad-spectrum antibiotics and transitioned to Augmentin until 03/23/2015 and stop. He no longer is oxygen dependent. Patient remains on chronic Coumadin therapy. Cardiology to follow-up on current plan for defibrillator. Presently on a dysphagia 2 thin liquid diet felt to be secondary to GERD versus esophageal dysmotility. Physical and occupational therapy evaluation completed with recommendations of physical medicine rehabilitation consult.Patient was admitted for a comprehensive rehab program  ROS Review of Systems  Cardiovascular: Positive for palpitations.  Gastrointestinal:   GERD  Genitourinary: Positive for urgency.  Musculoskeletal: Positive for myalgias.  Psychiatric/Behavioral: Positive for depression.  All other systems  reviewed and are negative   Past Medical History  Diagnosis Date  . Gout   . HTN (hypertension)   . Hyperlipidemia   . Osteoarthritis   . History of CVA (cerebrovascular accident)     Left pontine infarct July 2004; changed from Plavix to Coumadin in 2004 per MD at [facility]  . GERD (gastroesophageal reflux disease)   . DJD (degenerative joint disease)   . BPH (benign prostatic hypertrophy)   . Peripheral vascular disease   . Bilateral leg ulcer     ACHILLES AREA--  NONHEALING  . CKD (chronic kidney disease) stage 3, GFR 30-59 ml/min   . CAD (coronary artery disease)     a. LHC 4/12: Mid LAD 25%, mid diagonal 30%, AV circumflex 40%, proximal OM 25%, distal RCA 40%  . Chronic combined systolic and diastolic heart failure     a. Echo 05/2013: EF 25-30%, diffuse HK, restrictive physiology, trivial AI, mild MR, moderate LAE, reduced RV systolic function, PASP 42  . LBBB (left bundle branch block)   . PAF (paroxysmal atrial fibrillation)     a. amiodarone Rx started 05/2013;  b. chronic coumadin  . NICM (nonischemic cardiomyopathy)     a. EF 25-30%.  [name] Hx of cardiovascular stress test     Nuclear Stress Test (1/16): High risk stress nuclear study with a large, severe, partially reversible inferior and apical defect consistent with prior inferior and apical infarct; mild apical ischemia; severe LVE; study high risk due to reduced LV function.  EF 25% >> reviewed with Dr. Percival Spanish >> medical mgmt  . Hypothyroidism   . DM (diabetes mellitus), type 2    Past Surgical History  Procedure Laterality Date  . Cardiac catheterization  04-16-2011   DR [facility]    NON-OBSTRUCTIVE CAD. MILDLY ELEVATED PULMONARY PRESSURES/  ELEVATED  END-DIASTOLIC PRESSURE  . Transthoracic echocardiogram  12-31-2010    MODERATE CONCENTRIC LVH/ SYSTOLIC FUNCTION SEVERELY REDUCED/ EF 25-30%/  SEVERE HYPOKINESIS OF ANTEROSEPTAL MYOCARDIUM  AND ENTIREAPICAL MYOCARDIUM /  MODERATE HYPOKINESIS OF LATERAL, INFEROLATERAL,  INFERIOR,AND INFEROSEPTAL MYOCARIUM/  GRADE 3 DIASTOLIC DYSFUNCTION/ MILD MR  . Aortogram w/ bilateral lower extremitiy runoff  05-18-2013  DR FIELDS    LEFT LEG OCCLUDED PERONEAL AND ANTERIOR TIBIAL ARTERIES/ HIGH GRADE STENOIS 80% MIDDLE AND DISTAL THIRD OF POSTERIOR TIBIAL ARTERY/ RIGHT PERONEAL AND ANTERIOR TIBIAL ARTERY OCCLUDED/ 40% STENOSIS DISTALLY   . Abdominal aortagram N/A 05/18/2013    Procedure: ABDOMINAL Maxcine Ham;  Surgeon: Elam Dutch, MD;  Location: [facility] CATH LAB;  Service: Cardiovascular;  Laterality: N/A;  . Lower extremity angiogram Bilateral 05/18/2013    Procedure: LOWER EXTREMITY ANGIOGRAM;  Surgeon: Elam Dutch, MD;  Location: [facility] CATH LAB;  Service: Cardiovascular;  Laterality: Bilateral;  . Right heart catheterization N/A 02/13/2015    Procedure: RIGHT HEART CATH;  Surgeon: Larey Dresser, MD;  Location: [facility] CATH LAB;  Service: Cardiovascular;  Laterality: N/A;  . Bi-ventricular implantable cardioverter defibrillator N/A 03/12/2015    Procedure: BI-VENTRICULAR IMPLANTABLE CARDIOVERTER DEFIBRILLATOR  (CRT-D);  Surgeon: Evans Lance, MD;  Location: [facility] CATH LAB;  Service: Cardiovascular;  Laterality: N/A;   Family History  Problem Relation Age of Onset  . Hypertension Mother   . Heart disease Mother   . Diabetes Mother   . Gout Other   . Stroke Other   . Hypertension Father   . Heart disease Father   . Heart attack Maternal Grandmother   . Thyroid disease Neg Hx    Social History:  reports that he quit smoking about 40 years ago. He has never used smokeless tobacco. He reports that he does not drink alcohol or use illicit drugs. Allergies:  Allergies  Allergen Reactions  . Levothyroxine Sodium Other (See Comments)    MUST TAKE BRAND NAME GENERIC DOESN'T WORK FOR PATIENT  . Chicken Protein     Does not like chicken or [place]  . Eggs Or Egg-Derived Products     Does not like eggs   Medications Prior to Admission  Medication Sig Dispense Refill  .  amLODipine (NORVASC) 10 MG tablet Take 10 mg by mouth daily.    [name] atorvastatin (LIPITOR) 20 MG tablet Take 1 tablet (20 mg total) by mouth daily. 90 tablet 3  . carvedilol (COREG) 12.5 MG tablet Take 1 tablet (12.5 mg total) by mouth 2 (two) times daily with a meal. 60 tablet 11  . cholecalciferol (VITAMIN D) 1000 UNITS tablet Take 1,000 Units by mouth daily.    . CVS VITAMIN B12 1000 MCG tablet TAKE 1 TABLET BY MOUTH DAILY 100 tablet 1  . escitalopram (LEXAPRO) 5 MG tablet Take 1 tablet (5 mg total) by mouth daily. (Patient taking differently: Take 5 mg by mouth at bedtime. ) 30 tablet 5  . ferrous sulfate 325 (65 FE) MG tablet TAKE 1 TABLET BY MOUTH EVERY DAY 30 tablet 6  . glipiZIDE (GLUCOTROL) 5 MG tablet TAKE 1/2 TABLET BY MOUTH TWICE A DAY BEFORE A MEAL 30 tablet 11  . hydrALAZINE (APRESOLINE) 100 MG tablet Take 1 tablet (100 mg total) by mouth 3 (three) times daily. 90 tablet 11  . isosorbide mononitrate (IMDUR) 60 MG 24 hr tablet Take 1.5 tablets for 90 mg by mouth twice a day. (Patient taking differently: Take 90 mg by mouth 2 (two) times daily. Take 1.5  tablets for 90 mg by mouth twice a day.) 45 tablet 11  . SYMBICORT 160-4.5 MCG/ACT inhaler INHALE 2 PUFFS INTO THE LUNGS 2 TIMES DAILY 10.2 g 5  . SYNTHROID 100 MCG tablet TAKE 2 TABLETS BY MOUTH EVERY MORNING BEFORE BREAKFAST 60 tablet 11  . [DISCONTINUED] potassium chloride SA (KLOR-CON M20) 20 MEQ tablet Take 3 tabs TWICE DAILY (Patient taking differently: Take 60 mEq by mouth 2 (two) times daily. ) 180 tablet 6  . [DISCONTINUED] torsemide (DEMADEX) 20 MG tablet Take 1-2 tablets (20-40 mg total) by mouth 2 (two) times daily. Please take 7m in the AM and 40 in the PM 90 tablet 11  . [DISCONTINUED] warfarin (COUMADIN) 5 MG tablet TAKE 1 TABLET (5 MG TOTAL) BY MOUTH AS DIRECTED. 40 tablet 2  . OXYGEN Inhale 2.5 L into the lungs See admin instructions. USES OXYGEN EVERY BEDTIME USES DURING DAY ONLY AS NEEDED      Home: Home  Living Family/patient expects to be discharged to:: Private residence Living Arrangements: Spouse/significant other Available Help at Discharge: Family Type of Home: House Home Access: Stairs to enter ETechnical brewerof Steps: 5 Entrance Stairs-Rails: Right, Left, Can reach both Home Layout: Multi-level Alternate Level Stairs-Number of Steps: 13 Alternate Level Stairs-Rails: Left Home Equipment: Walker - 2 wheels, Cane - single point, SIndustrial/product designerHistory: Prior Function Level of Independence: Needs assistance Gait / Transfers Assistance Needed: Pt reports he ambulated mod I ADL's / Homemaking Assistance Needed: Pt reports he jsut started showering again after bil. leg injury ~17 mos ago. He states wife has to assist him with socks, but he is able to perform bathing and dressing, "but it takes some effort"  Functional Status:  Mobility: Bed Mobility Overal bed mobility: Needs Assistance Bed Mobility: Supine to Sit, Sit to Supine Supine to sit: Mod assist Sit to supine: Min assist General bed mobility comments: Assist for bil. LEs  Transfers Overall transfer level: Needs assistance Equipment used: 1 person hand held assist, Rolling walker (2 wheeled) Transfers: Sit to/from Stand, SGoogleTransfers Sit to Stand: Total assist (x3 attempt and only attained semi-upright position with face) Squat pivot transfers: Total assist General transfer comment: Unable to move sit to stand with +1 assist  Ambulation/Gait General Gait Details: unable    ADL: ADL Overall ADL's : Needs assistance/impaired Eating/Feeding: Independent, Bed level Grooming: Wash/dry hands, Wash/dry face, Oral care, Brushing hair, Set up, Sitting Upper Body Bathing: Set up, Supervision/ safety, Sitting, Bed level Lower Body Bathing: Maximal assistance, Bed level Upper Body Dressing : Minimal assistance, Sitting Lower Body Dressing: Total assistance, Bed level Toilet Transfer: Total  assistance (unable to attempt safely with +1 assist ) Toileting- Clothing Manipulation and Hygiene: Total assistance, Bed level Functional mobility during ADLs: Total assistance General ADL Comments: Pt moved to EOB.  Pain 9/10 Rt knee.  Unable to move into standing.  Unable to access bil. LEs   Cognition: Cognition Overall Cognitive Status: Within Functional Limits for tasks assessed Orientation Level: Oriented X4 Cognition Arousal/Alertness: Awake/alert Behavior During Therapy: WFL for tasks assessed/performed Overall Cognitive Status: Within Functional Limits for tasks assessed  Physical Exam: Blood pressure 128/76, pulse 59, temperature 98.2 F (36.8 C), temperature source Oral, resp. rate 18, height _0  (1.88 m), weight 95.5 kg (210 lb 8.6 oz), SpO2 96 %. Physical Exam Constitutional: He is oriented to person, place, and time. He appears well-developed.  HENT: PERRL Head: Normocephalic.  Eyes: EOM are normal.  Neck: Normal range  of motion. Neck supple. No thyromegaly present.  Cardiovascular:  Cardiac rate controlled without murmur Respiratory:  Lungs decreased breath sounds at the bases but clear to auscultation  GI: Soft. Bowel sounds are normal. He exhibits no distension.  Neurological: He is alert and oriented to person, place, and time.  Follow simple commands. Moves all 4's.3-4/5 UE's and 2+ hf, 3 ke and 4/5 ankles  Skin: Skin is warm and dry.  Psychiatric: He has a normal mood and affect. His behavior is normal. Thought content normal   Results for orders placed or performed during the hospital encounter of 03/13/15 (from the past 48 hour(s))  Glucose, capillary     Status: Abnormal   Collection Time: 03/15/15 12:01 PM  Result Value Ref Range   Glucose-Capillary 183 (H) 70 - 99 mg/dL  Glucose, capillary     Status: Abnormal   Collection Time: 03/15/15  5:06 PM  Result Value Ref Range   Glucose-Capillary 229 (H) 70 - 99 mg/dL  Glucose, capillary     Status:  Abnormal   Collection Time: 03/15/15 10:37 PM  Result Value Ref Range   Glucose-Capillary 300 (H) 70 - 99 mg/dL  Protime-INR     Status: Abnormal   Collection Time: 03/16/15  8:00 AM  Result Value Ref Range   Prothrombin Time 30.8 (H) 11.6 - 15.2 seconds   INR 2.93 (H) 0.00 - 9.62  Basic metabolic panel     Status: Abnormal   Collection Time: 03/16/15  8:00 AM  Result Value Ref Range   Sodium 141 135 - 145 mmol/L   Potassium 3.7 3.5 - 5.1 mmol/L   Chloride 108 96 - 112 mmol/L   CO2 23 19 - 32 mmol/L   Glucose, Bld 206 (H) 70 - 99 mg/dL   BUN 33 (H) 6 - 23 mg/dL   Creatinine, Ser 2.74 (H) 0.50 - 1.35 mg/dL   Calcium 8.7 8.4 - 10.5 mg/dL   GFR calc non Af Amer 21 (L) >90 mL/min   GFR calc Af Amer 25 (L) >90 mL/min    Comment: (NOTE) The eGFR has been calculated using the CKD EPI equation. This calculation has not been validated in all clinical situations. eGFR's persistently <90 mL/min signify possible Chronic Kidney Disease.    Anion gap 10 5 - 15  CBC     Status: Abnormal   Collection Time: 03/16/15  8:00 AM  Result Value Ref Range   WBC 13.4 (H) 4.0 - 10.5 K/uL   RBC 3.50 (L) 4.22 - 5.81 MIL/uL   Hemoglobin 8.9 (L) 13.0 - 17.0 g/dL   HCT 29.5 (L) 39.0 - 52.0 %   MCV 84.3 78.0 - 100.0 fL   MCH 25.4 (L) 26.0 - 34.0 pg   MCHC 30.2 30.0 - 36.0 g/dL   RDW 15.4 11.5 - 15.5 %   Platelets 188 150 - 400 K/uL  Glucose, capillary     Status: Abnormal   Collection Time: 03/16/15  8:15 AM  Result Value Ref Range   Glucose-Capillary 229 (H) 70 - 99 mg/dL  Glucose, capillary     Status: Abnormal   Collection Time: 03/16/15 12:04 PM  Result Value Ref Range   Glucose-Capillary 232 (H) 70 - 99 mg/dL  Glucose, capillary     Status: Abnormal   Collection Time: 03/16/15  4:49 PM  Result Value Ref Range   Glucose-Capillary 226 (H) 70 - 99 mg/dL  Glucose, capillary     Status: Abnormal   Collection Time: 03/16/15  9:19 PM  Result Value Ref Range   Glucose-Capillary 190 (H) 70 - 99  mg/dL  Glucose, capillary     Status: Abnormal   Collection Time: 03/17/15  7:42 AM  Result Value Ref Range   Glucose-Capillary 199 (H) 70 - 99 mg/dL  Protime-INR     Status: Abnormal   Collection Time: 03/17/15  7:56 AM  Result Value Ref Range   Prothrombin Time 33.4 (H) 11.6 - 15.2 seconds   INR 3.25 (H) 0.00 - 0.03  Basic metabolic panel     Status: Abnormal   Collection Time: 03/17/15  7:56 AM  Result Value Ref Range   Sodium 141 135 - 145 mmol/L   Potassium 4.2 3.5 - 5.1 mmol/L   Chloride 108 96 - 112 mmol/L   CO2 23 19 - 32 mmol/L   Glucose, Bld 203 (H) 70 - 99 mg/dL   BUN 42 (H) 6 - 23 mg/dL   Creatinine, Ser 2.57 (H) 0.50 - 1.35 mg/dL   Calcium 8.7 8.4 - 10.5 mg/dL   GFR calc non Af Amer 23 (L) >90 mL/min   GFR calc Af Amer 26 (L) >90 mL/min    Comment: (NOTE) The eGFR has been calculated using the CKD EPI equation. This calculation has not been validated in all clinical situations. eGFR's persistently <90 mL/min signify possible Chronic Kidney Disease.    Anion gap 10 5 - 15   No results found.     Medical Problem List and Plan: 1. Functional deficits secondary to Debility /HCAP/Gait disorder 2.  DVT Prophylaxis/Anticoagulation: Chronic coumadin/A fib.Monitor for any bleeding episodes 3. Pain Management: Hydrocodone as needed. 4. Mood.Lexapro 5 mg daily 5. Neuropsych: This patient is capable of making decisions on her own behalf. 6. Skin/Wound Care: Routine skin checks 7. Fluids/Electrolytes/Nutrition: strict I&O . 8.ID/HCAP.Vancomycin and Maxipime discontinue and change to Augmentin 500 mg 3 times a day until 03/23/2015. 9.LBBB./non ischemic cardiomyopathy.Follow up cardiology on defibrillator in reference to being unable to place LV lead due to no appropriate veins and plan follow-up 10.CRI.Baseline  Creatinine 2.28-2.75.follow up labs 11. Hypertension. Norvasc 10 mg daily, Coreg 12.5 mg twice a day, hydralazine 100 mg 3 times a day, Imdur 90 mg twice a day,  Demadex 10 mg daily. Monitor with increased mobility 12. GERD versus esophageal dysmotility Dysphagia 2 thin liquid diet. Advance diet as tolerated 13. Hypothyroidism. Synthroid 14. Hyperlipidemia. Lipitor 15. Acute on chronic anemia. Continue iron supplement. Follow-up CBC 16. COPD. Symbicort   Post Admission Physician Evaluation: 1. Functional deficits secondary  to debility related to multiple medical, recent respiratory failure and pneumonia. 2. Patient is admitted to receive collaborative, interdisciplinary care between the physiatrist, rehab nursing staff, and therapy team. 3. Patient's level of medical complexity and substantial therapy needs in context of that medical necessity cannot be provided at a lesser intensity of care such as a SNF. 4. Patient has experienced substantial functional loss from his/her baseline which was documented above under the "Functional History" and "Functional Status" headings.  Judging by the patient's diagnosis, physical exam, and functional history, the patient has potential for functional progress which will result in measurable gains while on inpatient rehab.  These gains will be of substantial and practical use upon discharge  in facilitating mobility and self-care at the household level. 5. Physiatrist will provide 24 hour management of medical needs as well as oversight of the therapy plan/treatment and provide guidance as appropriate regarding the interaction of the two. 6. 24 hour rehab nursing will  assist with bladder management, bowel management, safety, skin/wound care, disease management, medication administration and patient education  and help integrate therapy concepts, techniques,education, etc. 7. PT will assess and treat for/with: Lower extremity strength, range of motion, stamina, balance, functional mobility, safety, adaptive techniques and equipment, NMR, pain mgt, activity tolerance, .   Goals are: supervision to min assist. 8. OT will  assess and treat for/with: ADL's, functional mobility, safety, upper extremity strength, adaptive techniques and equipment, NMR, activity tolerance, family and patient education.   Goals are: supervision to min/mod assist. Therapy may proceed with showering this patient. 9. SLP will assess and treat for/with: n/a.  Goals are: n/a. 10. Case Management and Social Worker will assess and treat for psychological issues and discharge planning. 11. Team conference will be held weekly to assess progress toward goals and to determine barriers to discharge. 12. Patient will receive at least 3 hours of therapy per day at least 5 days per week. 13. ELOS: 13-20 days       14. Prognosis:  excellent     Meredith Staggers, MD, [facility] 03/17/2015   03/17/2015

## 2015-03-17 NOTE — Progress Notes (Signed)
Patient and family arrived from [facility] with RN and belongings. Oriented to room, fall prevention plan, rehab safety plan, rehab schedule, Health Resource Notebook, and rehab process with verbal understanding. Patient resting comfortably in bed with family at bedside.

## 2015-03-17 NOTE — PMR Pre-admission (Signed)
PMR Admission Coordinator Pre-Admission Assessment  Patient: Frank Mclean is an 74 y.o., male MRN: [id number] DOB: [birth year] Height: 6\' 2"  (188 cm) Weight: 95.5 kg (210 lb 8.6 oz)              Insurance Information HMO:     PPO:      PCP:      IPA:      80/20: yes     OTHER: no HMO PRIMARY: Medicare a and b      Policy#: [id number]      Subscriber: pt Benefits:  Phone #: palmetto online     Name: 03/17/15 Eff. Date: a 05/20/04 b 06/19/04     Deduct: $1288      Out of Pocket Max: none      Life Max: none CIR: 100%      SNF: 20 full days last in SNF one year ago Outpatient: 80%     Co-Pay: 20% Home Health: 100%      Co-Pay: none DME: 80%     Co-Pay: 20% Providers: pt choice  SECONDARY: Federal BCBS      Policy#Elvera Lennox [id number]      Subscriber: wife  Medicaid Application Date:       Case Manager:  Disability Application Date:       Case Worker:   Emergency Contact Information Contact Information    Name Relation Home Work Mobile   [place] Spouse [phone]  [phone]   Smith,Brandon    [phone]     Current Medical History  Patient Admitting Diagnosis: debility/gait disorder  History of Present Illness: Frank Mclean is a 74 y.o. right handed male with history of chronic renal insufficiency with baseline creatinine 2.28-2.75, long-standing nonischemic cardiomyopathy, systolic congestive heart failure, left bundle branch block, atrial fibrillation with chronic Coumadin. Patient lives with wife used a Youth worker prior to admission.  Recent admission 03/10/2015 with acute on chronic heart failure improved with diuresis and ICD implanted on 03/12/15 and unable to place LV lead due to unsatisfactory anatomy. Recommended appointment to see Dr. Laneta Simmers for evaluation for epicardial LV lead. Has appointment on 04/03/15. Discharged home 03/13/2015.  He was discharged to home with noted increasing shortness of breath over the past 2 days as well as bouts of vomiting. Patient also with  recent fall while at home.  He was readmitted for ongoing evaluation 03/14/2015. Cranial CT scan negative for acute changes. CT of the chest with patchy bibasal or air space opacities concerning for multifocal pneumonia. Placed on broad-spectrum antibiotics. Patient remains on chronic Coumadin therapy.   Past Medical History  Past Medical History  Diagnosis Date  . Gout   . HTN (hypertension)   . Hyperlipidemia   . Osteoarthritis   . History of CVA (cerebrovascular accident)     Left pontine infarct July 2004; changed from Plavix to Coumadin in 2004 per MD at [facility]  . GERD (gastroesophageal reflux disease)   . DJD (degenerative joint disease)   . BPH (benign prostatic hypertrophy)   . Peripheral vascular disease   . Bilateral leg ulcer     ACHILLES AREA--  NONHEALING  . CKD (chronic kidney disease) stage 3, GFR 30-59 ml/min   . CAD (coronary artery disease)     a. LHC 4/12: Mid LAD 25%, mid diagonal 30%, AV circumflex 40%, proximal OM 25%, distal RCA 40%  . Chronic combined systolic and diastolic heart failure     a. Echo 05/2013: EF 25-30%, diffuse HK, restrictive physiology, trivial  AI, mild MR, moderate LAE, reduced RV systolic function, PASP 42  . LBBB (left bundle branch block)   . PAF (paroxysmal atrial fibrillation)     a. amiodarone Rx started 05/2013;  b. chronic coumadin  . NICM (nonischemic cardiomyopathy)     a. EF 25-30%.  [name] Hx of cardiovascular stress test     Nuclear Stress Test (1/16): High risk stress nuclear study with a large, severe, partially reversible inferior and apical defect consistent with prior inferior and apical infarct; mild apical ischemia; severe LVE; study high risk due to reduced LV function.  EF 25% >> reviewed with Dr. Antoine Poche >> medical mgmt  . Hypothyroidism   . DM (diabetes mellitus), type 2     Family History  family history includes Diabetes in his mother; Gout in his other; Heart attack in his maternal grandmother; Heart disease in his  father and mother; Hypertension in his father and mother; Stroke in his other. There is no history of Thyroid disease.  Prior Rehab/Hospitalizations: BLE injury form his woodshop. Has been in [facility] last year for about 40 days. Pt does not want to go to SNF ever again.  Current Medications   Current facility-administered medications:  .  acetaminophen (TYLENOL) tablet 650 mg, 650 mg, Oral, Q6H PRN, Lorretta Harp, MD, 650 mg at 03/15/15 1720 .  amLODipine (NORVASC) tablet 10 mg, 10 mg, Oral, Daily, Lorretta Harp, MD, 10 mg at 03/17/15 0934 .  atorvastatin (LIPITOR) tablet 20 mg, 20 mg, Oral, Daily, Lorretta Harp, MD, 20 mg at 03/17/15 0934 .  budesonide-formoterol (SYMBICORT) 160-4.5 MCG/ACT inhaler 2 puff, 2 puff, Inhalation, BID, Lorretta Harp, MD, 2 puff at 03/16/15 2132 .  carvedilol (COREG) tablet 12.5 mg, 12.5 mg, Oral, BID WC, Lorretta Harp, MD, 12.5 mg at 03/17/15 0900 .  ceFEPIme (MAXIPIME) 1 g in dextrose 5 % 50 mL IVPB, 1 g, Intravenous, Q24H, Veronda P Bryk, RPH, 1 g at 03/17/15 0612 .  cholecalciferol (VITAMIN D) tablet 1,000 Units, 1,000 Units, Oral, Daily, Lorretta Harp, MD, 1,000 Units at 03/17/15 [phone] .  escitalopram (LEXAPRO) tablet 5 mg, 5 mg, Oral, QHS, Lorretta Harp, MD, 5 mg at 03/16/15 2330 .  feeding supplement (ENSURE ENLIVE) (ENSURE ENLIVE) liquid 237 mL, 237 mL, Oral, BID BM, Jenifer A Williams, RD, 237 mL at 03/16/15 1346 .  ferrous sulfate tablet 325 mg, 325 mg, Oral, TID WC, Leroy Sea, MD, 325 mg at 03/17/15 0900 .  hydrALAZINE (APRESOLINE) tablet 100 mg, 100 mg, Oral, TID, Lorretta Harp, MD, 100 mg at 03/17/15 0934 .  HYDROcodone-acetaminophen (NORCO/VICODIN) 5-325 MG per tablet 1 tablet, 1 tablet, Oral, Q6H PRN, Leroy Sea, MD, 1 tablet at 03/16/15 2250 .  hydrOXYzine (VISTARIL) injection 25 mg, 25 mg, Intramuscular, Q6H PRN, Lorretta Harp, MD .  insulin aspart (novoLOG) injection 0-9 Units, 0-9 Units, Subcutaneous, TID WC, Lorretta Harp, MD, 2 Units at 03/17/15 0900 .   isosorbide mononitrate (IMDUR) 24 hr tablet 90 mg, 90 mg, Oral, BID, Lorretta Harp, MD, 90 mg at 03/17/15 0933 .  levothyroxine (SYNTHROID, LEVOTHROID) tablet 200 mcg, 200 mcg, Oral, QAC breakfast, Lorretta Harp, MD, 200 mcg at 03/17/15 0900 .  methylPREDNISolone (MEDROL DOSEPAK) tablet 4 mg, 4 mg, Oral, 4X daily taper, Leroy Sea, MD, 4 mg at 03/17/15 0900 .  multivitamin with minerals tablet 1 tablet, 1 tablet, Oral, Daily, Jenifer A Williams, RD, 1 tablet at 03/17/15 0934 .  pantoprazole (PROTONIX) EC tablet 40 mg, 40 mg, Oral,  BID, Leroy Sea, MD, 40 mg at 03/17/15 6962 .  polyethylene glycol (MIRALAX / GLYCOLAX) packet 17 g, 17 g, Oral, Daily, Rolan Lipa, NP, 17 g at 03/17/15 0934 .  RESOURCE THICKENUP CLEAR, , Oral, PRN, Leroy Sea, MD .  torsemide (DEMADEX) tablet 10 mg, 10 mg, Oral, Daily, Leroy Sea, MD, 10 mg at 03/17/15 0933 .  vancomycin (VANCOCIN) IVPB 1000 mg/200 mL premix, 1,000 mg, Intravenous, Q24H, Veronda P Bryk, RPH, 1,000 mg at 03/17/15 0513 .  vitamin B-12 (CYANOCOBALAMIN) tablet 1,000 mcg, 1,000 mcg, Oral, Daily, Lorretta Harp, MD, 1,000 mcg at 03/17/15 0933 .  warfarin (COUMADIN) tablet 2.5 mg, 2.5 mg, Oral, Once per day on Mon Fri, [place], [place] .  [START ON 03/18/2015] warfarin (COUMADIN) tablet 5 mg, 5 mg, Oral, Once per day on Sun Tue Wed Thu Sat, Norva Pavlov, [place] .  Warfarin - Pharmacist Dosing Inpatient, , Does not apply, q1800, Juliette Mangle, RPH, 1 each at 03/14/15 1800  Patients Current Diet: DIET DYS 2 Room service appropriate?: Yes; Fluid consistency:: Thin  Precautions / Restrictions Precautions Precautions: Fall, ICD/Pacemaker Precaution Comments: LUE in sling Restrictions Weight Bearing Restrictions: No   Prior Activity Level Household: limited for past two years. not driving . B LE injuries. sedentary. Pt started on a antidepressant 4 to 6 weeks ago. IN January pt was sedentary and depressed. Now with cardiac  issues which he is sedentary in recliner at home. His gout has flared now which makes mobility painful. Pt likes to cook, retired Art gallery manager, like to do his woodworking and painting but has not done in past two years due to BLE injuries/wounds acquired in his woodshop which is downstairs in his full basement.   Home Assistive Devices / Equipment Home Assistive Devices/Equipment: Oxygen (2.5LPM at night) Home Equipment: Dan Humphreys - 2 wheels, [employer] - single point, Shower seat  Prior Functional Level Prior Function Level of Independence: Needs assistance Gait / Transfers Assistance Needed: Mod I with RW over past couple months. Reported good and bad days/ bathed self  ADL's / Homemaking Assistance Needed: Pt reports he jsut started showering again after bil. leg injury ~17 mos ago. He states wife has to assist him with socks, but he is able to perform bathing and dressing, "but it takes some effort"  Current Functional Level Cognition  Overall Cognitive Status: Within Functional Limits for tasks assessed Orientation Level: Oriented X4    Extremity Assessment (includes Sensation/Coordination)  Upper Extremity Assessment: Generalized weakness, LUE deficits/detail LUE: Unable to fully assess due to immobilization (Pt with pacemaker precautions )  Lower Extremity Assessment: Defer to PT evaluation RLE Deficits / Details: grossly 3-/5 RLE: Unable to fully assess due to pain RLE Coordination: decreased fine motor, decreased gross motor LLE Deficits / Details: grossly 3- to 3/5 LLE: Unable to fully assess due to pain LLE Coordination: decreased fine motor, decreased gross motor    ADLs  Overall ADL's : Needs assistance/impaired Eating/Feeding: Independent, Bed level Grooming: Wash/dry hands, Wash/dry face, Oral care, Brushing hair, Set up, Sitting Upper Body Bathing: Set up, Supervision/ safety, Sitting, Bed level Lower Body Bathing: Maximal assistance, Bed level Upper Body Dressing : Minimal  assistance, Sitting Lower Body Dressing: Total assistance, Bed level Toilet Transfer: Total assistance (unable to attempt safely with +1 assist ) Toileting- Clothing Manipulation and Hygiene: Total assistance, Bed level Functional mobility during ADLs: Total assistance General ADL Comments: Pt moved to EOB.  Pain 9/10 Rt knee.  Unable to  move into standing.  Unable to access bil. LEs     Mobility  Overal bed mobility: Needs Assistance Bed Mobility: Supine to Sit, Sit to Supine Supine to sit: Mod assist Sit to supine: Min assist General bed mobility comments: Assist for bil. LEs     Transfers  Overall transfer level: Needs assistance Equipment used: 1 person hand held assist, Rolling walker (2 wheeled) Transfers: Sit to/from Stand, [employer] Transfers Sit to Stand: Total assist (x3 attempt and only attained semi-upright position with face) Squat pivot transfers: Total assist General transfer comment: Unable to move sit to stand with +1 assist     Ambulation / Gait / Stairs / Wheelchair Mobility  Ambulation/Gait General Gait Details: unable    Posture / Balance Dynamic Sitting Balance Sitting balance - Comments: Pt with mild posterior bias, but can correct  Balance Overall balance assessment: Needs assistance Sitting-balance support: Feet supported Sitting balance-Leahy Scale: Fair Sitting balance - Comments: Pt with mild posterior bias, but can correct  Standing balance-Leahy Scale: Zero Standing balance comment: Total assist to attain semi upright position blocking knee.  Much like a dead lift.    Special needs/care consideration Oxygen 2.5 liters at hs Bowel mgmt: continent. No BM since admission Bladder mgmt:foley Diabetic mgmt yes   Previous Home Environment Living Arrangements: Spouse/significant other, Other (Comment) Psychologist, sport and exercise, wife's brother, also lives with pt and assists in h)  Lives With: Spouse, Other (Comment) [name], brother in Social worker) Available Help at  Discharge: Family, Available 24 hours/day Type of Home: House Home Layout: Two level, Able to live on main level with bedroom/bathroom, Other (Comment) (one level with full basement) Alternate Level Stairs-Rails: Left Alternate Level Stairs-Number of Steps:  (from basement) Home Access: Stairs to enter Entrance Stairs-Rails: Right, Left, Can reach both Entrance Stairs-Number of Steps: 5 Bathroom Shower/Tub: Walk-in shower, Other (comment) (has built in seat and also chair placed in shower to assist) Bathroom Toilet: Standard Bathroom Accessibility: Yes How Accessible: Accessible via walker Home Care Services: No [name] has lived with pt and his wife for past two years. He can provide 24/7 min assist level. He drives him to his appointments, etc. Pt's wife went to [place] 03/15/15 through 03/24/15 to visit their oldest daughter. Pt has oldest daughter from his first marriage with Joy then they divorced. Second wife had four children, and then pt remarried Joy 18 years ago. There are tense family dynamics with children, [name], and [name].  Discharge Living Setting Plans for Discharge Living Setting: Patient's home, Lives with (comment), Other (Comment) (spouse and her brother, [name], lives with them) Type of Home at Discharge: House Discharge Home Layout: Able to live on main level with bedroom/bathroom, Other (Comment) (one level home with full basement. basement has his wood sho) Discharge Home Access: Stairs to enter Entrance Stairs-Rails: Right, Left, Can reach both Entrance Stairs-Number of Steps: 5 Discharge Bathroom Shower/Tub: Walk-in shower Discharge Bathroom Toilet: Standard Discharge Bathroom Accessibility: Yes How Accessible: Accessible via walker Does the patient have any problems obtaining your medications?: No Pt has life alert  Social/Family/Support Systems Patient Roles: Spouse, Parent, Other (Comment) (has one son and 4 daughters) Contact Information: Kallel Hubbard, wife and  brother in law, [name] Anticipated Caregiver: wife and [name] Anticipated Industrial/product designer Information: see above Ability/Limitations of Caregiver: wife in [place] 3/26 until 4/4. [name] is available her now with no limitations Caregiver Availability: 24/7 Discharge Plan Discussed with Primary Caregiver: Yes Is Caregiver In Agreement with Plan?: Yes Does Caregiver/Family have Issues with Lodging/Transportation  while Pt is in Rehab?: No  Goals/Additional Needs Patient/Family Goal for Rehab: supervision to min assist with PT and OT Expected length of stay: ELOS 13 to 17 days Pt/Family Agrees to Admission and willing to participate: Yes Program Orientation Provided & Reviewed with Pt/Caregiver Including Roles  & Responsibilities: Yes  Decrease burden of Care through IP rehab admission: n/a  Possible need for SNF placement upon discharge:If pt fails to progress, I discussed the need for possible SNF rehab after CIR. Pt does not want to return to SNF for he spent approximately 40 days last year in two SNFs after he injured his BLEs.  Patient Condition: This patient's condition remains as documented in the consult dated 03/17/2015, in which the Rehabilitation Physician determined and documented that the patient's condition is appropriate for intensive rehabilitative care in an inpatient rehabilitation facility. Will admit to inpatient rehab today.  Preadmission Screen Completed By:  Clois Dupes, 03/17/2015 12:36 PM ______________________________________________________________________   Discussed status with Dr. Riley Kill on 03/17/2015 at  1256 and received telephone approval for admission today.  Admission Coordinator:  Clois Dupes, time 1257 Date 03/17/2015.

## 2015-03-17 NOTE — Interval H&P Note (Signed)
Frank Mclean was admitted today to Inpatient Rehabilitation with the diagnosis of debility after multiple medical.  [name] history has been reviewed, patient examined, and there is no change in status.  Patient continues to be appropriate for intensive inpatient rehabilitation.  I have reviewed the patient's chart and labs.  Questions were answered to the patient's satisfaction.  Sadie Hazelett T 03/17/2015, 8:30 PM

## 2015-03-17 NOTE — Progress Notes (Signed)
Received prescreen request for inpatient rehab and noted that rehab consult has already been ordered. Frank Mclean, rehab admission coordinator will be following up on this patient after rehab consult completed. She can be reached at cell: [phone].  Thanks.  Juliann Mule, PT Rehabilitation Admissions Coordinator [phone]

## 2015-03-17 NOTE — Progress Notes (Signed)
Patient to be transferred to Inpatient rehab room 9 on the 4th floor. IVs removed, discontinued telemetry. Patient to be transported in wheelchair with foley catheter.  Vitals remain stable.  Report given to [facility].  All valuables sent with patient.

## 2015-03-17 NOTE — Discharge Instructions (Signed)
Monitor INR every 3 days  Follow with Primary MD [name], MD in 7 days   Get CBC, CMP, 2 view Chest X ray checked  by SNF MD or Primary MD next visit.    Activity: As tolerated with Full fall precautions use walker/cane & assistance as needed   Disposition SNF   Diet: Dysphagia 2 Nectar thick liquids with full feeding assistance and aspiration precautions. Must follow with speech therapy closely at SNF.  For Heart failure patients - Check your Weight same time everyday, if you gain over 2 pounds, or you develop in leg swelling, experience more shortness of breath or chest pain, call your Primary MD immediately. Follow Cardiac Low Salt Diet and 1.5 lit/day fluid restriction.   On your next visit with your primary care physician please Get Medicines reviewed and adjusted.   Please request your Prim.MD to go over all Hospital Tests and Procedure/Radiological results at the follow up, please get all Hospital records sent to your Prim MD by signing hospital release before you go home.   If you experience worsening of your admission symptoms, develop shortness of breath, life threatening emergency, suicidal or homicidal thoughts you must seek medical attention immediately by calling 911 or calling your MD immediately  if symptoms less severe.  You Must read complete instructions/literature along with all the possible adverse reactions/side effects for all the Medicines you take and that have been prescribed to you. Take any new Medicines after you have completely understood and accpet all the possible adverse reactions/side effects.   Do not drive, operating heavy machinery, perform activities at heights, swimming or participation in water activities or provide baby sitting services if your were admitted for syncope or siezures until you have seen by Primary MD or a Neurologist and advised to do so again.  Do not drive when taking Pain medications.    Do not take more than  prescribed Pain, Sleep and Anxiety Medications  Special Instructions: If you have smoked or chewed Tobacco  in the last 2 yrs please stop smoking, stop any regular Alcohol  and or any Recreational drug use.  Wear Seat belts while driving.   Please note  You were cared for by a hospitalist during your hospital stay. If you have any questions about your discharge medications or the care you received while you were in the hospital after you are discharged, you can call the unit and asked to speak with the hospitalist on call if the hospitalist that took care of you is not available. Once you are discharged, your primary care physician will handle any further medical issues. Please note that NO REFILLS for any discharge medications will be authorized once you are discharged, as it is imperative that you return to your primary care physician (or establish a relationship with a primary care physician if you do not have one) for your aftercare needs so that they can reassess your need for medications and monitor your lab values.

## 2015-03-17 NOTE — Progress Notes (Signed)
CARE MANAGEMENT NOTE 03/17/2015  Patient:  Frank Mclean, Frank Mclean   Account Number:  [id number]  Date Initiated:  03/17/2015  Documentation initiated by:  [facility]  Subjective/Objective Assessment:   admitted with pneumonia     Action/Plan:   PT/OT evals- recommended CIR   Anticipated DC Date:  03/17/2015   Anticipated DC Plan:  IP REHAB FACILITY  In-house referral  Clinical Social Worker      DC Planning Services  CM consult      PAC Choice  IP REHAB              Status of service:  Completed, signed off Medicare Important Message given?  YES (If response is "NO", the following Medicare IM given date fields will be blank) Date Medicare IM given:  03/17/2015 Medicare IM given by:  [facility]  Discharge Disposition:  IP REHAB FACILITY  Per UR Regulation:  Reviewed for med. necessity/level of care/duration of stay  I

## 2015-03-17 NOTE — Progress Notes (Addendum)
Physical Therapy Treatment Patient Details Name: Frank Mclean MRN: [id number] DOB: [birth year] Today's Date: 03/17/2015    History of Present Illness 74 yo male with LBBB was fitted with defib with incomplete insertion, revision next week.  Due to outpatient status was forced to go home after procedure instead of SNF for rehab and fell at home necessitating return to hospital same day.  Found to now have PNA.    PT Comments    Pt able to participate well with transfer training/standing trials using the STEDY.  Ready for CIR  Follow Up Recommendations  CIR     Equipment Recommendations  None recommended by PT    Recommendations for Other Services Rehab consult     Precautions / Restrictions Precautions Precautions: Fall;ICD/Pacemaker Restrictions Weight Bearing Restrictions: No    Mobility  Bed Mobility               General bed mobility comments: up in the chair on arrival  Transfers Overall transfer level: Needs assistance Equipment used:  (STEDY) Transfers: Sit to/from Stand Sit to Stand: Min assist;+2 physical assistance (to min guard)         General transfer comment: use of the STEDY to allow pt to maximize confidence.  Ambulation/Gait                 Stairs            Wheelchair Mobility    Modified Rankin (Stroke Patients Only)       Balance Overall balance assessment: Needs assistance Sitting-balance support: No upper extremity supported Sitting balance-Leahy Scale: Fair Sitting balance - Comments: still having trouble w/shifting and scooting forward.   Standing balance support: Bilateral upper extremity supported Standing balance-Leahy Scale: Poor Standing balance comment: Used STEDY to practice sit to stand from lower and higher surfaces  Approximently 20 sit to stands total with cueing for hand placement and mechanics o the stand.  Also able to take small steps in place  with appropriate w/shift while in the STEDY                     Cognition Arousal/Alertness: Awake/alert Behavior During Therapy: [facility] for tasks assessed/performed Overall Cognitive Status: Within Functional Limits for tasks assessed                      Exercises      General Comments        Pertinent Vitals/Pain Pain Assessment: No/denies pain    Home Living                      Prior Function            PT Goals (current goals can now be found in the care plan section) Acute Rehab PT Goals Patient Stated Goal: To regain independence  PT Goal Formulation: With patient Time For Goal Achievement: 03/27/15 Potential to Achieve Goals: Good Progress towards PT goals: Progressing toward goals    Frequency  Min 3X/week    PT Plan Current plan remains appropriate    Co-evaluation             End of Session   Activity Tolerance: Patient tolerated treatment well Patient left: in chair;with call bell/phone within reach;with family/visitor present     Time: 1533-1600 PT Time Calculation (min) (ACUTE ONLY): 27 min  Charges:  $Therapeutic Activity: 23-37 mins  G Codes:      Castiel Lauricella, Eliseo Gum 03/17/2015, 5:04 PM 03/17/2015  Kerrick Bing, PT [phone]  (pager)

## 2015-03-17 NOTE — Progress Notes (Signed)
SUBJECTIVE:  Patient is stable today. He may be transferred to rehabilitation today. His pacemaker stable. I spoke with the patient's brother-in-law who wants to be sure that the patient is stable before transferred to rehabilitation. His overall cardiac status is stable.   Filed Vitals:   03/16/15 1652 03/16/15 2121 03/16/15 2132 03/17/15 0531  BP: 117/78 121/65  128/76  Pulse:  66  59  Temp:  99.6 F (37.6 C)  98.2 F (36.8 C)  TempSrc:  Oral  Oral  Resp:  18  18  Height:      Weight:      SpO2:  97% 98% 96%     Intake/Output Summary (Last 24 hours) at 03/17/15 1058 Last data filed at 03/17/15 0929  Gross per 24 hour  Intake    940 ml  Output   1100 ml  Net   -160 ml    LABS: Basic Metabolic Panel:  Recent Labs  16/10/96 0800 03/17/15 0756  NA 141 141  K 3.7 4.2  CL 108 108  CO2 23 23  GLUCOSE 206* 203*  BUN 33* 42*  CREATININE 2.74* 2.57*  CALCIUM 8.7 8.7   Liver Function Tests: No results for input(s): AST, ALT, ALKPHOS, BILITOT, PROT, ALBUMIN in the last 72 hours. No results for input(s): LIPASE, AMYLASE in the last 72 hours. CBC:  Recent Labs  03/15/15 0553 03/16/15 0800  WBC 12.7* 13.4*  HGB 8.8* 8.9*  HCT 29.4* 29.5*  MCV 86.5 84.3  PLT 198 188   Cardiac Enzymes:  Recent Labs  03/14/15 1435 03/14/15 2035  TROPONINI 0.33* 0.30*   BNP: Invalid input(s): POCBNP D-Dimer: No results for input(s): DDIMER in the last 72 hours. Hemoglobin A1C: No results for input(s): HGBA1C in the last 72 hours. Fasting Lipid Panel: No results for input(s): CHOL, HDL, LDLCALC, TRIG, CHOLHDL, LDLDIRECT in the last 72 hours. Thyroid Function Tests: No results for input(s): TSH, T4TOTAL, T3FREE, THYROIDAB in the last 72 hours.  Invalid input(s): FREET3  RADIOLOGY: Dg Chest 2 View  03/14/2015   CLINICAL DATA:  Acute onset of generalized weakness and shortness of breath. Initial encounter.  EXAM: CHEST  2 VIEW  COMPARISON:  Chest radiograph performed  03/13/2015  FINDINGS: The lungs are well-aerated. Mild vascular congestion is noted. Mild left basilar opacity may reflect atelectasis or possibly minimal interstitial edema. There is no evidence of pleural effusion or pneumothorax.  The heart is enlarged. A pacemaker/AICD is noted at the left chest wall, with leads ending at the right atrium and right ventricle. No acute osseous abnormalities are seen.  IMPRESSION: Mild vascular congestion and cardiomegaly noted. Mild left basilar airspace opacity may reflect atelectasis or possibly minimal interstitial edema.   Electronically Signed   By: Roanna Raider M.D.   On: 03/14/2015 00:28   Dg Chest 2 View  03/13/2015   CLINICAL DATA:  74 year old male status post pacemaker placement  EXAM: CHEST  2 VIEW  COMPARISON:  Preoperative chest x-ray 03/10/2015  FINDINGS: New left subclavian approach cardiac rhythm maintenance device. Leads project over the right atrium and right ventricle. No evidence of pneumothorax or hemothorax. Stable cardiomegaly. Stable mediastinal contours. Atherosclerotic calcifications in the aorta. Pulmonary hyperexpansion and central bronchitic change. No pulmonary edema. No acute osseous abnormality.  IMPRESSION: 1. New left subclavian approach cardiac rhythm maintenance device with leads projecting over the right atrium and right ventricle. 2. No evidence of pneumothorax, hemothorax or other complication.   Electronically Signed   By: Vilma Prader  Archer Asa M.D.   On: 03/13/2015 07:43   Dg Chest 2 View  03/10/2015   CLINICAL DATA:  Acute onset of shortness of breath. Initial encounter.  EXAM: CHEST  2 VIEW  COMPARISON:  Chest radiograph performed 11/27/2013  FINDINGS: The lungs are well-aerated. Vascular congestion is noted, with bilateral central airspace opacities and a small left pleural effusion, likely reflecting mild recurrent pulmonary edema. No pneumothorax is seen.  The heart is enlarged.  No acute osseous abnormalities are seen.   IMPRESSION: Vascular congestion and cardiomegaly, with bilateral central airspace opacities and a small left pleural effusion, likely reflecting mild recurrent pulmonary edema.   Electronically Signed   By: Roanna Raider M.D.   On: 03/10/2015 22:54   Ct Head Wo Contrast  03/14/2015   CLINICAL DATA:  Acute onset of generalized weakness. Initial encounter.  EXAM: CT HEAD WITHOUT CONTRAST  TECHNIQUE: Contiguous axial images were obtained from the base of the skull through the vertex without intravenous contrast.  COMPARISON:  CT of the head performed 09/13/2013  FINDINGS: There is no evidence of acute infarction, mass lesion, or intra- or extra-axial hemorrhage on CT.  Prominence of the ventricles and sulci reflects mild to moderate cortical volume loss. Cerebellar atrophy is noted. Scattered periventricular and subcortical white matter change likely reflects small vessel ischemic microangiopathy. A prominent chronic lacunar infarct is noted at the right thalamus, and a chronic lacunar infarct is seen at the left basal ganglia.  The brainstem and fourth ventricle are within normal limits. The cerebral hemispheres demonstrate grossly normal gray-white differentiation. No mass effect or midline shift is seen.  There is no evidence of fracture; visualized osseous structures are unremarkable in appearance. The orbits are within normal limits. The paranasal sinuses and mastoid air cells are well-aerated. No significant soft tissue abnormalities are seen.  IMPRESSION: 1. No acute intracranial pathology seen on CT. 2. Mild to moderate cortical volume loss and scattered small vessel ischemic microangiopathy. 3. 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   Electronically Signed   By: Roanna Raider M.D.   On: 03/14/2015 00:48   Ct Chest Wo Contrast  03/14/2015   CLINICAL DATA:  Acute onset of shortness of breath and generalized weakness. Initial encounter.  EXAM: CT CHEST WITHOUT CONTRAST   TECHNIQUE: Multidetector CT imaging of the chest was performed following the standard protocol without IV contrast.  COMPARISON:  Chest radiograph performed earlier today at 12:03 a.m.  FINDINGS: Patchy bibasilar airspace opacities are noted at the lung bases, in a peribronchovascular distribution, and at the left lingula. This is concerning for pneumonia. The lungs are otherwise clear. No pleural effusion or pneumothorax is seen. No masses are identified.  Scattered coronary artery calcifications are seen. The heart is mildly enlarged. A pacemaker is noted at the left chest wall, with leads ending at the right atrium and right ventricle. No pericardial effusion is identified. No mediastinal lymphadenopathy is seen. Scattered calcification is noted along the aortic arch. The great vessels are grossly unremarkable in appearance. The thyroid gland is within normal limits. No axillary lymphadenopathy is seen.  The visualized portions of the liver and spleen are unremarkable, aside from a calcified granuloma at the right hepatic lobe. Mildly increased attenuation within the gallbladder is nonspecific; no definite stones are seen. The visualized portions of the pancreas and adrenal glands are within normal limits. A 4.6 cm left renal cyst is noted. Mild calcification is noted along the proximal abdominal aorta  No acute  osseous abnormalities are seen. Mild degenerative change is noted at the lower cervical spine.  IMPRESSION: 1. Patchy bibasilar airspace opacities at the lung bases, in a peribronchovascular distribution, and at the left lingula. This is concerning for multifocal pneumonia. 2. Scattered coronary artery calcifications seen. Mild cardiomegaly noted. 3. Left renal cyst noted. 4. Mild calcification along the proximal abdominal aorta.   Electronically Signed   By: Roanna Raider M.D.   On: 03/14/2015 02:32    PHYSICAL EXAM  patient is oriented to person time and place. Affect is normal. Lungs reveal  scattered rhonchi. Cardiac exam reveals an S1 and S2. Abdomen is soft. There is no peripheral edema. The pacemaker site looks good.   TELEMETRY:   I have reviewed telemetry today March 17, 2015. There is normal sinus rhythm. There is pacing. There is a 5 beat run of supraventricular tachycardia.   ASSESSMENT AND PLAN:     HCAP (healthcare-associated pneumonia)     The patient had been discharged home after pacemaker placement. He returned soon after with pneumonia. This has now been treated.    DM (diabetes mellitus), type 2 with renal complications   B12 deficiency   HLD (hyperlipidemia)   Cerebral artery occlusion with cerebral infarction   GERD   CKD (chronic kidney disease) stage 3, GFR 30-59 ml/min   BPH (benign prostatic hyperplasia)   Osteoarthritis   LBBB (left bundle branch block)   CAD (coronary artery disease)   COPD with bronchitis   Chronic venous insufficiency   Gait disorder   Anticoagulated on Coumadin   PAF (paroxysmal atrial fibrillation)   Hypothyroidism   Sepsis    ICD (implantable cardioverter-defibrillator) in place    [name] pacer site looks quite good.  [name] overall cardiac status is stable. I will look into arranging his post hospital follow-up with the cardiology team. I feel he is stable for transfer to rehabilitation.   Willa Rough 03/17/2015 10:58 AM

## 2015-03-17 NOTE — H&P (Signed)
Physical Medicine and Rehabilitation Admission H&P    Chief Complaint  Patient presents with  . Weakness  : HPI: Frank Mclean is a 74 y.o. right handed male with history of chronic renal insufficiency with baseline creatinine 2.28-2.75, long-standing nonischemic cardiomyopathy, systolic congestive heart failure, left bundle branch block, atrial fibrillation with chronic Coumadin. Patient lives with wife used a Programmer, multimedia prior to admission. He was recently seen in the office of cardiology services for evaluation of need for ICD defibrillator and scheduled. Recent admit 03/10/2015 with acute on chronic heart failure improved with diuresis as well as ICD defibrillator implantation but unable to place LV lead, no appropriate veins and discharged home 03/13/2015. [name] He was discharged to home with noted increasing shortness of breath over the past 2 days as well as bouts of vomiting with temperature of 100.2 and WBC 14.9. Patient also with recent fall while at home. He was readmitted for ongoing evaluation 03/14/2015. Cranial CT scan negative for acute changes. CT of the chest with patchy bibasal or air space opacities concerning for multifocal pneumonia. Placed on broad-spectrum antibiotics and transitioned to Augmentin until 03/23/2015 and stop. He no longer is oxygen dependent. Patient remains on chronic Coumadin therapy. Cardiology to follow-up on current plan for defibrillator. Presently on a dysphagia 2 thin liquid diet felt to be secondary to GERD versus esophageal dysmotility. Physical and occupational therapy evaluation completed with recommendations of physical medicine rehabilitation consult.Patient was admitted for a comprehensive rehab program  ROS Review of Systems  Cardiovascular: Positive for palpitations.  Gastrointestinal:   GERD  Genitourinary: Positive for urgency.  Musculoskeletal: Positive for myalgias.  Psychiatric/Behavioral: Positive for depression.  All other systems  reviewed and are negative   Past Medical History  Diagnosis Date  . Gout   . HTN (hypertension)   . Hyperlipidemia   . Osteoarthritis   . History of CVA (cerebrovascular accident)     Left pontine infarct July 2004; changed from Plavix to Coumadin in 2004 per MD at [facility]  . GERD (gastroesophageal reflux disease)   . DJD (degenerative joint disease)   . BPH (benign prostatic hypertrophy)   . Peripheral vascular disease   . Bilateral leg ulcer     ACHILLES AREA--  NONHEALING  . CKD (chronic kidney disease) stage 3, GFR 30-59 ml/min   . CAD (coronary artery disease)     a. LHC 4/12: Mid LAD 25%, mid diagonal 30%, AV circumflex 40%, proximal OM 25%, distal RCA 40%  . Chronic combined systolic and diastolic heart failure     a. Echo 05/2013: EF 25-30%, diffuse HK, restrictive physiology, trivial AI, mild MR, moderate LAE, reduced RV systolic function, PASP 42  . LBBB (left bundle branch block)   . PAF (paroxysmal atrial fibrillation)     a. amiodarone Rx started 05/2013;  b. chronic coumadin  . NICM (nonischemic cardiomyopathy)     a. EF 25-30%.  [name] Hx of cardiovascular stress test     Nuclear Stress Test (1/16): High risk stress nuclear study with a large, severe, partially reversible inferior and apical defect consistent with prior inferior and apical infarct; mild apical ischemia; severe LVE; study high risk due to reduced LV function.  EF 25% >> reviewed with Dr. Percival Spanish >> medical mgmt  . Hypothyroidism   . DM (diabetes mellitus), type 2    Past Surgical History  Procedure Laterality Date  . Cardiac catheterization  04-16-2011   DR [facility]    NON-OBSTRUCTIVE CAD. MILDLY ELEVATED PULMONARY PRESSURES/  ELEVATED  END-DIASTOLIC PRESSURE  . Transthoracic echocardiogram  12-31-2010    MODERATE CONCENTRIC LVH/ SYSTOLIC FUNCTION SEVERELY REDUCED/ EF 25-30%/  SEVERE HYPOKINESIS OF ANTEROSEPTAL MYOCARDIUM  AND ENTIREAPICAL MYOCARDIUM /  MODERATE HYPOKINESIS OF LATERAL, INFEROLATERAL,  INFERIOR,AND INFEROSEPTAL MYOCARIUM/  GRADE 3 DIASTOLIC DYSFUNCTION/ MILD MR  . Aortogram w/ bilateral lower extremitiy runoff  05-18-2013  DR FIELDS    LEFT LEG OCCLUDED PERONEAL AND ANTERIOR TIBIAL ARTERIES/ HIGH GRADE STENOIS 80% MIDDLE AND DISTAL THIRD OF POSTERIOR TIBIAL ARTERY/ RIGHT PERONEAL AND ANTERIOR TIBIAL ARTERY OCCLUDED/ 40% STENOSIS DISTALLY   . Abdominal aortagram N/A 05/18/2013    Procedure: ABDOMINAL Maxcine Ham;  Surgeon: Elam Dutch, MD;  Location: [facility] CATH LAB;  Service: Cardiovascular;  Laterality: N/A;  . Lower extremity angiogram Bilateral 05/18/2013    Procedure: LOWER EXTREMITY ANGIOGRAM;  Surgeon: Elam Dutch, MD;  Location: [facility] CATH LAB;  Service: Cardiovascular;  Laterality: Bilateral;  . Right heart catheterization N/A 02/13/2015    Procedure: RIGHT HEART CATH;  Surgeon: Larey Dresser, MD;  Location: [facility] CATH LAB;  Service: Cardiovascular;  Laterality: N/A;  . Bi-ventricular implantable cardioverter defibrillator N/A 03/12/2015    Procedure: BI-VENTRICULAR IMPLANTABLE CARDIOVERTER DEFIBRILLATOR  (CRT-D);  Surgeon: Evans Lance, MD;  Location: [facility] CATH LAB;  Service: Cardiovascular;  Laterality: N/A;   Family History  Problem Relation Age of Onset  . Hypertension Mother   . Heart disease Mother   . Diabetes Mother   . Gout Other   . Stroke Other   . Hypertension Father   . Heart disease Father   . Heart attack Maternal Grandmother   . Thyroid disease Neg Hx    Social History:  reports that he quit smoking about 40 years ago. He has never used smokeless tobacco. He reports that he does not drink alcohol or use illicit drugs. Allergies:  Allergies  Allergen Reactions  . Levothyroxine Sodium Other (See Comments)    MUST TAKE BRAND NAME GENERIC DOESN'T WORK FOR PATIENT  . Chicken Protein     Does not like chicken or [place]  . Eggs Or Egg-Derived Products     Does not like eggs   Medications Prior to Admission  Medication Sig Dispense Refill  .  amLODipine (NORVASC) 10 MG tablet Take 10 mg by mouth daily.    [name] atorvastatin (LIPITOR) 20 MG tablet Take 1 tablet (20 mg total) by mouth daily. 90 tablet 3  . carvedilol (COREG) 12.5 MG tablet Take 1 tablet (12.5 mg total) by mouth 2 (two) times daily with a meal. 60 tablet 11  . cholecalciferol (VITAMIN D) 1000 UNITS tablet Take 1,000 Units by mouth daily.    . CVS VITAMIN B12 1000 MCG tablet TAKE 1 TABLET BY MOUTH DAILY 100 tablet 1  . escitalopram (LEXAPRO) 5 MG tablet Take 1 tablet (5 mg total) by mouth daily. (Patient taking differently: Take 5 mg by mouth at bedtime. ) 30 tablet 5  . ferrous sulfate 325 (65 FE) MG tablet TAKE 1 TABLET BY MOUTH EVERY DAY 30 tablet 6  . glipiZIDE (GLUCOTROL) 5 MG tablet TAKE 1/2 TABLET BY MOUTH TWICE A DAY BEFORE A MEAL 30 tablet 11  . hydrALAZINE (APRESOLINE) 100 MG tablet Take 1 tablet (100 mg total) by mouth 3 (three) times daily. 90 tablet 11  . isosorbide mononitrate (IMDUR) 60 MG 24 hr tablet Take 1.5 tablets for 90 mg by mouth twice a day. (Patient taking differently: Take 90 mg by mouth 2 (two) times daily. Take 1.5  tablets for 90 mg by mouth twice a day.) 45 tablet 11  . SYMBICORT 160-4.5 MCG/ACT inhaler INHALE 2 PUFFS INTO THE LUNGS 2 TIMES DAILY 10.2 g 5  . SYNTHROID 100 MCG tablet TAKE 2 TABLETS BY MOUTH EVERY MORNING BEFORE BREAKFAST 60 tablet 11  . [DISCONTINUED] potassium chloride SA (KLOR-CON M20) 20 MEQ tablet Take 3 tabs TWICE DAILY (Patient taking differently: Take 60 mEq by mouth 2 (two) times daily. ) 180 tablet 6  . [DISCONTINUED] torsemide (DEMADEX) 20 MG tablet Take 1-2 tablets (20-40 mg total) by mouth 2 (two) times daily. Please take 7m in the AM and 40 in the PM 90 tablet 11  . [DISCONTINUED] warfarin (COUMADIN) 5 MG tablet TAKE 1 TABLET (5 MG TOTAL) BY MOUTH AS DIRECTED. 40 tablet 2  . OXYGEN Inhale 2.5 L into the lungs See admin instructions. USES OXYGEN EVERY BEDTIME USES DURING DAY ONLY AS NEEDED      Home: Home  Living Family/patient expects to be discharged to:: Private residence Living Arrangements: Spouse/significant other Available Help at Discharge: Family Type of Home: House Home Access: Stairs to enter ETechnical brewerof Steps: 5 Entrance Stairs-Rails: Right, Left, Can reach both Home Layout: Multi-level Alternate Level Stairs-Number of Steps: 13 Alternate Level Stairs-Rails: Left Home Equipment: Walker - 2 wheels, Cane - single point, SIndustrial/product designerHistory: Prior Function Level of Independence: Needs assistance Gait / Transfers Assistance Needed: Pt reports he ambulated mod I ADL's / Homemaking Assistance Needed: Pt reports he jsut started showering again after bil. leg injury ~17 mos ago. He states wife has to assist him with socks, but he is able to perform bathing and dressing, "but it takes some effort"  Functional Status:  Mobility: Bed Mobility Overal bed mobility: Needs Assistance Bed Mobility: Supine to Sit, Sit to Supine Supine to sit: Mod assist Sit to supine: Min assist General bed mobility comments: Assist for bil. LEs  Transfers Overall transfer level: Needs assistance Equipment used: 1 person hand held assist, Rolling walker (2 wheeled) Transfers: Sit to/from Stand, SGoogleTransfers Sit to Stand: Total assist (x3 attempt and only attained semi-upright position with face) Squat pivot transfers: Total assist General transfer comment: Unable to move sit to stand with +1 assist  Ambulation/Gait General Gait Details: unable    ADL: ADL Overall ADL's : Needs assistance/impaired Eating/Feeding: Independent, Bed level Grooming: Wash/dry hands, Wash/dry face, Oral care, Brushing hair, Set up, Sitting Upper Body Bathing: Set up, Supervision/ safety, Sitting, Bed level Lower Body Bathing: Maximal assistance, Bed level Upper Body Dressing : Minimal assistance, Sitting Lower Body Dressing: Total assistance, Bed level Toilet Transfer: Total  assistance (unable to attempt safely with +1 assist ) Toileting- Clothing Manipulation and Hygiene: Total assistance, Bed level Functional mobility during ADLs: Total assistance General ADL Comments: Pt moved to EOB.  Pain 9/10 Rt knee.  Unable to move into standing.  Unable to access bil. LEs   Cognition: Cognition Overall Cognitive Status: Within Functional Limits for tasks assessed Orientation Level: Oriented X4 Cognition Arousal/Alertness: Awake/alert Behavior During Therapy: WFL for tasks assessed/performed Overall Cognitive Status: Within Functional Limits for tasks assessed  Physical Exam: Blood pressure 128/76, pulse 59, temperature 98.2 F (36.8 C), temperature source Oral, resp. rate 18, height _0  (1.88 m), weight 95.5 kg (210 lb 8.6 oz), SpO2 96 %. Physical Exam Constitutional: He is oriented to person, place, and time. He appears well-developed.  HENT: PERRL Head: Normocephalic.  Eyes: EOM are normal.  Neck: Normal range  of motion. Neck supple. No thyromegaly present.  Cardiovascular:  Cardiac rate controlled without murmur Respiratory:  Lungs decreased breath sounds at the bases but clear to auscultation  GI: Soft. Bowel sounds are normal. He exhibits no distension.  Neurological: He is alert and oriented to person, place, and time.  Follow simple commands. Moves all 4's.3-4/5 UE's and 2+ hf, 3 ke and 4/5 ankles  Skin: Skin is warm and dry.  Psychiatric: He has a normal mood and affect. His behavior is normal. Thought content normal   Results for orders placed or performed during the hospital encounter of 03/13/15 (from the past 48 hour(s))  Glucose, capillary     Status: Abnormal   Collection Time: 03/15/15 12:01 PM  Result Value Ref Range   Glucose-Capillary 183 (H) 70 - 99 mg/dL  Glucose, capillary     Status: Abnormal   Collection Time: 03/15/15  5:06 PM  Result Value Ref Range   Glucose-Capillary 229 (H) 70 - 99 mg/dL  Glucose, capillary     Status:  Abnormal   Collection Time: 03/15/15 10:37 PM  Result Value Ref Range   Glucose-Capillary 300 (H) 70 - 99 mg/dL  Protime-INR     Status: Abnormal   Collection Time: 03/16/15  8:00 AM  Result Value Ref Range   Prothrombin Time 30.8 (H) 11.6 - 15.2 seconds   INR 2.93 (H) 0.00 - 9.62  Basic metabolic panel     Status: Abnormal   Collection Time: 03/16/15  8:00 AM  Result Value Ref Range   Sodium 141 135 - 145 mmol/L   Potassium 3.7 3.5 - 5.1 mmol/L   Chloride 108 96 - 112 mmol/L   CO2 23 19 - 32 mmol/L   Glucose, Bld 206 (H) 70 - 99 mg/dL   BUN 33 (H) 6 - 23 mg/dL   Creatinine, Ser 2.74 (H) 0.50 - 1.35 mg/dL   Calcium 8.7 8.4 - 10.5 mg/dL   GFR calc non Af Amer 21 (L) >90 mL/min   GFR calc Af Amer 25 (L) >90 mL/min    Comment: (NOTE) The eGFR has been calculated using the CKD EPI equation. This calculation has not been validated in all clinical situations. eGFR's persistently <90 mL/min signify possible Chronic Kidney Disease.    Anion gap 10 5 - 15  CBC     Status: Abnormal   Collection Time: 03/16/15  8:00 AM  Result Value Ref Range   WBC 13.4 (H) 4.0 - 10.5 K/uL   RBC 3.50 (L) 4.22 - 5.81 MIL/uL   Hemoglobin 8.9 (L) 13.0 - 17.0 g/dL   HCT 29.5 (L) 39.0 - 52.0 %   MCV 84.3 78.0 - 100.0 fL   MCH 25.4 (L) 26.0 - 34.0 pg   MCHC 30.2 30.0 - 36.0 g/dL   RDW 15.4 11.5 - 15.5 %   Platelets 188 150 - 400 K/uL  Glucose, capillary     Status: Abnormal   Collection Time: 03/16/15  8:15 AM  Result Value Ref Range   Glucose-Capillary 229 (H) 70 - 99 mg/dL  Glucose, capillary     Status: Abnormal   Collection Time: 03/16/15 12:04 PM  Result Value Ref Range   Glucose-Capillary 232 (H) 70 - 99 mg/dL  Glucose, capillary     Status: Abnormal   Collection Time: 03/16/15  4:49 PM  Result Value Ref Range   Glucose-Capillary 226 (H) 70 - 99 mg/dL  Glucose, capillary     Status: Abnormal   Collection Time: 03/16/15  9:19 PM  Result Value Ref Range   Glucose-Capillary 190 (H) 70 - 99  mg/dL  Glucose, capillary     Status: Abnormal   Collection Time: 03/17/15  7:42 AM  Result Value Ref Range   Glucose-Capillary 199 (H) 70 - 99 mg/dL  Protime-INR     Status: Abnormal   Collection Time: 03/17/15  7:56 AM  Result Value Ref Range   Prothrombin Time 33.4 (H) 11.6 - 15.2 seconds   INR 3.25 (H) 0.00 - 0.03  Basic metabolic panel     Status: Abnormal   Collection Time: 03/17/15  7:56 AM  Result Value Ref Range   Sodium 141 135 - 145 mmol/L   Potassium 4.2 3.5 - 5.1 mmol/L   Chloride 108 96 - 112 mmol/L   CO2 23 19 - 32 mmol/L   Glucose, Bld 203 (H) 70 - 99 mg/dL   BUN 42 (H) 6 - 23 mg/dL   Creatinine, Ser 2.57 (H) 0.50 - 1.35 mg/dL   Calcium 8.7 8.4 - 10.5 mg/dL   GFR calc non Af Amer 23 (L) >90 mL/min   GFR calc Af Amer 26 (L) >90 mL/min    Comment: (NOTE) The eGFR has been calculated using the CKD EPI equation. This calculation has not been validated in all clinical situations. eGFR's persistently <90 mL/min signify possible Chronic Kidney Disease.    Anion gap 10 5 - 15   No results found.     Medical Problem List and Plan: 1. Functional deficits secondary to Debility /HCAP/Gait disorder 2.  DVT Prophylaxis/Anticoagulation: Chronic coumadin/A fib.Monitor for any bleeding episodes 3. Pain Management: Hydrocodone as needed. 4. Mood.Lexapro 5 mg daily 5. Neuropsych: This patient is capable of making decisions on her own behalf. 6. Skin/Wound Care: Routine skin checks 7. Fluids/Electrolytes/Nutrition: strict I&O . 8.ID/HCAP.Vancomycin and Maxipime discontinue and change to Augmentin 500 mg 3 times a day until 03/23/2015. 9.LBBB./non ischemic cardiomyopathy.Follow up cardiology on defibrillator in reference to being unable to place LV lead due to no appropriate veins and plan follow-up 10.CRI.Baseline  Creatinine 2.28-2.75.follow up labs 11. Hypertension. Norvasc 10 mg daily, Coreg 12.5 mg twice a day, hydralazine 100 mg 3 times a day, Imdur 90 mg twice a day,  Demadex 10 mg daily. Monitor with increased mobility 12. GERD versus esophageal dysmotility Dysphagia 2 thin liquid diet. Advance diet as tolerated 13. Hypothyroidism. Synthroid 14. Hyperlipidemia. Lipitor 15. Acute on chronic anemia. Continue iron supplement. Follow-up CBC 16. COPD. Symbicort   Post Admission Physician Evaluation: 1. Functional deficits secondary  to debility related to multiple medical, recent respiratory failure and pneumonia. 2. Patient is admitted to receive collaborative, interdisciplinary care between the physiatrist, rehab nursing staff, and therapy team. 3. Patient's level of medical complexity and substantial therapy needs in context of that medical necessity cannot be provided at a lesser intensity of care such as a SNF. 4. Patient has experienced substantial functional loss from his/her baseline which was documented above under the "Functional History" and "Functional Status" headings.  Judging by the patient's diagnosis, physical exam, and functional history, the patient has potential for functional progress which will result in measurable gains while on inpatient rehab.  These gains will be of substantial and practical use upon discharge  in facilitating mobility and self-care at the household level. 5. Physiatrist will provide 24 hour management of medical needs as well as oversight of the therapy plan/treatment and provide guidance as appropriate regarding the interaction of the two. 6. 24 hour rehab nursing will  assist with bladder management, bowel management, safety, skin/wound care, disease management, medication administration and patient education  and help integrate therapy concepts, techniques,education, etc. 7. PT will assess and treat for/with: Lower extremity strength, range of motion, stamina, balance, functional mobility, safety, adaptive techniques and equipment, NMR, pain mgt, activity tolerance, .   Goals are: supervision to min assist. 8. OT will  assess and treat for/with: ADL's, functional mobility, safety, upper extremity strength, adaptive techniques and equipment, NMR, activity tolerance, family and patient education.   Goals are: supervision to min/mod assist. Therapy may proceed with showering this patient. 9. SLP will assess and treat for/with: n/a.  Goals are: n/a. 10. Case Management and Social Worker will assess and treat for psychological issues and discharge planning. 11. Team conference will be held weekly to assess progress toward goals and to determine barriers to discharge. 12. Patient will receive at least 3 hours of therapy per day at least 5 days per week. 13. ELOS: 13-20 days       14. Prognosis:  excellent     Meredith Staggers, MD, [facility] 03/17/2015   03/17/2015

## 2015-03-17 NOTE — Consult Note (Signed)
Physical Medicine and Rehabilitation Consult Reason for Consult: Debilitation/pneumonia/multi-medical Referring Physician: [facility]   HPI: Frank Mclean is a 74 y.o. right handed male with history of chronic renal insufficiency with baseline creatinine 2.28-2.75, long-standing nonischemic cardiomyopathy, systolic congestive heart failure, left bundle branch block, atrial fibrillation with chronic Coumadin. Patient lives with wife used a Youth worker prior to admission. He was recently seen in the office of cardiology services and defibrillator implant was recommended and scheduled for mid April. Recent admission 03/10/2015 with acute on chronic heart failure improved with diuresis and discharged home 03/13/2015. Attempts had been made with recent admission for Med[employer] ICD placement however unable to perform due to lack of appropriate veins and appointment was to be made as outpatient for follow-up. He was discharged to home with noted increasing shortness of breath over the past 2 days as well as bouts of vomiting. Patient also with recent fall while at home. He was readmitted for ongoing evaluation 03/14/2015. Cranial CT scan negative for acute changes. CT of the chest with patchy bibasal or air space opacities concerning for multifocal pneumonia. Placed on broad-spectrum antibiotics. Patient remains on chronic Coumadin therapy. Cardiology to follow-up on current plan for defibrillator. Physical and occupational therapy evaluation completed with recommendations of physical medicine rehabilitation consult.   Review of Systems  Cardiovascular: Positive for palpitations.  Gastrointestinal:       GERD  Genitourinary: Positive for urgency.  Musculoskeletal: Positive for myalgias.  Psychiatric/Behavioral: Positive for depression.  All other systems reviewed and are negative.  Past Medical History  Diagnosis Date  . Gout   . HTN (hypertension)   . Hyperlipidemia   . Osteoarthritis   .  History of CVA (cerebrovascular accident)     Left pontine infarct July 2004; changed from Plavix to Coumadin in 2004 per MD at [facility]  . GERD (gastroesophageal reflux disease)   . DJD (degenerative joint disease)   . BPH (benign prostatic hypertrophy)   . Peripheral vascular disease   . Bilateral leg ulcer     ACHILLES AREA--  NONHEALING  . CKD (chronic kidney disease) stage 3, GFR 30-59 ml/min   . CAD (coronary artery disease)     a. LHC 4/12: Mid LAD 25%, mid diagonal 30%, AV circumflex 40%, proximal OM 25%, distal RCA 40%  . Chronic combined systolic and diastolic heart failure     a. Echo 05/2013: EF 25-30%, diffuse HK, restrictive physiology, trivial AI, mild MR, moderate LAE, reduced RV systolic function, PASP 42  . LBBB (left bundle branch block)   . PAF (paroxysmal atrial fibrillation)     a. amiodarone Rx started 05/2013;  b. chronic coumadin  . NICM (nonischemic cardiomyopathy)     a. EF 25-30%.  [name] Hx of cardiovascular stress test     Nuclear Stress Test (1/16): High risk stress nuclear study with a large, severe, partially reversible inferior and apical defect consistent with prior inferior and apical infarct; mild apical ischemia; severe LVE; study high risk due to reduced LV function.  EF 25% >> reviewed with Dr. Antoine Poche >> medical mgmt  . Hypothyroidism   . DM (diabetes mellitus), type 2    Past Surgical History  Procedure Laterality Date  . Cardiac catheterization  04-16-2011   DR [facility]    NON-OBSTRUCTIVE CAD. MILDLY ELEVATED PULMONARY PRESSURES/ ELEVATED  END-DIASTOLIC PRESSURE  . Transthoracic echocardiogram  12-31-2010    MODERATE CONCENTRIC LVH/ SYSTOLIC FUNCTION SEVERELY REDUCED/ EF 25-30%/  SEVERE HYPOKINESIS OF ANTEROSEPTAL MYOCARDIUM  AND  ENTIREAPICAL MYOCARDIUM /  MODERATE HYPOKINESIS OF LATERAL, INFEROLATERAL, INFERIOR,AND INFEROSEPTAL MYOCARIUM/  GRADE 3 DIASTOLIC DYSFUNCTION/ MILD MR  . Aortogram w/ bilateral lower extremitiy runoff  05-18-2013  DR FIELDS      LEFT LEG OCCLUDED PERONEAL AND ANTERIOR TIBIAL ARTERIES/ HIGH GRADE STENOIS 80% MIDDLE AND DISTAL THIRD OF POSTERIOR TIBIAL ARTERY/ RIGHT PERONEAL AND ANTERIOR TIBIAL ARTERY OCCLUDED/ 40% STENOSIS DISTALLY   . Abdominal aortagram N/A 05/18/2013    Procedure: ABDOMINAL Ronny Flurry;  Surgeon: Sherren Kerns, MD;  Location: [facility] CATH LAB;  Service: Cardiovascular;  Laterality: N/A;  . Lower extremity angiogram Bilateral 05/18/2013    Procedure: LOWER EXTREMITY ANGIOGRAM;  Surgeon: Sherren Kerns, MD;  Location: [facility] CATH LAB;  Service: Cardiovascular;  Laterality: Bilateral;  . Right heart catheterization N/A 02/13/2015    Procedure: RIGHT HEART CATH;  Surgeon: Laurey Morale, MD;  Location: [facility] CATH LAB;  Service: Cardiovascular;  Laterality: N/A;  . Bi-ventricular implantable cardioverter defibrillator N/A 03/12/2015    Procedure: BI-VENTRICULAR IMPLANTABLE CARDIOVERTER DEFIBRILLATOR  (CRT-D);  Surgeon: Marinus Maw, MD;  Location: [facility] CATH LAB;  Service: Cardiovascular;  Laterality: N/A;   Family History  Problem Relation Age of Onset  . Hypertension Mother   . Heart disease Mother   . Diabetes Mother   . Gout Other   . Stroke Other   . Hypertension Father   . Heart disease Father   . Heart attack Maternal Grandmother   . Thyroid disease Neg Hx    Social History:  reports that he quit smoking about 40 years ago. He has never used smokeless tobacco. He reports that he does not drink alcohol or use illicit drugs. Allergies:  Allergies  Allergen Reactions  . Levothyroxine Sodium Other (See Comments)    MUST TAKE BRAND NAME GENERIC DOESN'T WORK FOR PATIENT  . Chicken Protein     Does not like chicken or [place]  . Eggs Or Egg-Derived Products     Does not like eggs   Medications Prior to Admission  Medication Sig Dispense Refill  . amLODipine (NORVASC) 10 MG tablet Take 10 mg by mouth daily.    [name] atorvastatin (LIPITOR) 20 MG tablet Take 1 tablet (20 mg total) by mouth daily. 90 tablet  3  . carvedilol (COREG) 12.5 MG tablet Take 1 tablet (12.5 mg total) by mouth 2 (two) times daily with a meal. 60 tablet 11  . cholecalciferol (VITAMIN D) 1000 UNITS tablet Take 1,000 Units by mouth daily.    . CVS VITAMIN B12 1000 MCG tablet TAKE 1 TABLET BY MOUTH DAILY 100 tablet 1  . escitalopram (LEXAPRO) 5 MG tablet Take 1 tablet (5 mg total) by mouth daily. (Patient taking differently: Take 5 mg by mouth at bedtime. ) 30 tablet 5  . ferrous sulfate 325 (65 FE) MG tablet TAKE 1 TABLET BY MOUTH EVERY DAY 30 tablet 6  . glipiZIDE (GLUCOTROL) 5 MG tablet TAKE 1/2 TABLET BY MOUTH TWICE A DAY BEFORE A MEAL 30 tablet 11  . hydrALAZINE (APRESOLINE) 100 MG tablet Take 1 tablet (100 mg total) by mouth 3 (three) times daily. 90 tablet 11  . isosorbide mononitrate (IMDUR) 60 MG 24 hr tablet Take 1.5 tablets for 90 mg by mouth twice a day. (Patient taking differently: Take 90 mg by mouth 2 (two) times daily. Take 1.5 tablets for 90 mg by mouth twice a day.) 45 tablet 11  . potassium chloride SA (KLOR-CON M20) 20 MEQ tablet Take 3 tabs TWICE DAILY (Patient taking  differently: Take 60 mEq by mouth 2 (two) times daily. ) 180 tablet 6  . SYMBICORT 160-4.5 MCG/ACT inhaler INHALE 2 PUFFS INTO THE LUNGS 2 TIMES DAILY 10.2 g 5  . SYNTHROID 100 MCG tablet TAKE 2 TABLETS BY MOUTH EVERY MORNING BEFORE BREAKFAST 60 tablet 11  . torsemide (DEMADEX) 20 MG tablet Take 1-2 tablets (20-40 mg total) by mouth 2 (two) times daily. Please take 20mg  in the AM and 40 in the PM 90 tablet 11  . warfarin (COUMADIN) 5 MG tablet TAKE 1 TABLET (5 MG TOTAL) BY MOUTH AS DIRECTED. 40 tablet 2  . OXYGEN Inhale 2.5 L into the lungs See admin instructions. USES OXYGEN EVERY BEDTIME USES DURING DAY ONLY AS NEEDED      Home: Home Living Family/patient expects to be discharged to:: Private residence Living Arrangements: Spouse/significant other Available Help at Discharge: Family Type of Home: House Home Access: Stairs to  enter Secretary/administrator of Steps: 5 Entrance Stairs-Rails: Right, Left, Can reach both Home Layout: Multi-level Alternate Level Stairs-Number of Steps: 13 Alternate Level Stairs-Rails: Left Home Equipment: Walker - 2 wheels, Cane - single point, Banker History: Prior Function Level of Independence: Needs assistance Gait / Transfers Assistance Needed: Pt reports he ambulated mod I ADL's / Homemaking Assistance Needed: Pt reports he jsut started showering again after bil. leg injury ~17 mos ago. He states wife has to assist him with socks, but he is able to perform bathing and dressing, "but it takes some effort" Functional Status:  Mobility: Bed Mobility Overal bed mobility: Needs Assistance Bed Mobility: Supine to Sit, Sit to Supine Supine to sit: Mod assist Sit to supine: Min assist General bed mobility comments: Assist for bil. LEs  Transfers Overall transfer level: Needs assistance Equipment used: 1 person hand held assist, Rolling walker (2 wheeled) Transfers: Sit to/from Stand, [employer] Transfers Sit to Stand: Total assist (x3 attempt and only attained semi-upright position with face) Squat pivot transfers: Total assist General transfer comment: Unable to move sit to stand with +1 assist  Ambulation/Gait General Gait Details: unable    ADL: ADL Overall ADL's : Needs assistance/impaired Eating/Feeding: Independent, Bed level Grooming: Wash/dry hands, Wash/dry face, Oral care, Brushing hair, Set up, Sitting Upper Body Bathing: Set up, Supervision/ safety, Sitting, Bed level Lower Body Bathing: Maximal assistance, Bed level Upper Body Dressing : Minimal assistance, Sitting Lower Body Dressing: Total assistance, Bed level Toilet Transfer: Total assistance (unable to attempt safely with +1 assist ) Toileting- Clothing Manipulation and Hygiene: Total assistance, Bed level Functional mobility during ADLs: Total assistance General ADL Comments: Pt  moved to EOB.  Pain 9/10 Rt knee.  Unable to move into standing.  Unable to access bil. LEs   Cognition: Cognition Overall Cognitive Status: Within Functional Limits for tasks assessed Orientation Level: Oriented X4 Cognition Arousal/Alertness: Awake/alert Behavior During Therapy: WFL for tasks assessed/performed Overall Cognitive Status: Within Functional Limits for tasks assessed  Blood pressure 128/76, pulse 59, temperature 98.2 F (36.8 C), temperature source Oral, resp. rate 18, height 6\' 2"  (1.88 m), weight 95.5 kg (210 lb 8.6 oz), SpO2 96 %. Physical Exam  Constitutional: He is oriented to person, place, and time. He appears well-developed.  HENT:  Head: Normocephalic.  Eyes: EOM are normal.  Neck: Normal range of motion. Neck supple. No thyromegaly present.  Cardiovascular:  Cardiac rate controlled  Respiratory:  Lungs decreased breath sounds at the bases but clear to auscultation  GI: Soft. Bowel sounds are normal. He  exhibits no distension.  Neurological: He is alert and oriented to person, place, and time.  Follow simple commands. Moves all 4's.   Skin: Skin is warm and dry.  Psychiatric: He has a normal mood and affect. His behavior is normal. Thought content normal.    Results for orders placed or performed during the hospital encounter of 03/13/15 (from the past 24 hour(s))  Protime-INR     Status: Abnormal   Collection Time: 03/16/15  8:00 AM  Result Value Ref Range   Prothrombin Time 30.8 (H) 11.6 - 15.2 seconds   INR 2.93 (H) 0.00 - 1.49  Basic metabolic panel     Status: Abnormal   Collection Time: 03/16/15  8:00 AM  Result Value Ref Range   Sodium 141 135 - 145 mmol/L   Potassium 3.7 3.5 - 5.1 mmol/L   Chloride 108 96 - 112 mmol/L   CO2 23 19 - 32 mmol/L   Glucose, Bld 206 (H) 70 - 99 mg/dL   BUN 33 (H) 6 - 23 mg/dL   Creatinine, Ser 1.61 (H) 0.50 - 1.35 mg/dL   Calcium 8.7 8.4 - 09.6 mg/dL   GFR calc non Af Amer 21 (L) >90 mL/min   GFR calc Af Amer  25 (L) >90 mL/min   Anion gap 10 5 - 15  CBC     Status: Abnormal   Collection Time: 03/16/15  8:00 AM  Result Value Ref Range   WBC 13.4 (H) 4.0 - 10.5 K/uL   RBC 3.50 (L) 4.22 - 5.81 MIL/uL   Hemoglobin 8.9 (L) 13.0 - 17.0 g/dL   HCT 04.5 (L) 40.9 - 81.1 %   MCV 84.3 78.0 - 100.0 fL   MCH 25.4 (L) 26.0 - 34.0 pg   MCHC 30.2 30.0 - 36.0 g/dL   RDW 91.4 78.2 - 95.6 %   Platelets 188 150 - 400 K/uL  Glucose, capillary     Status: Abnormal   Collection Time: 03/16/15  8:15 AM  Result Value Ref Range   Glucose-Capillary 229 (H) 70 - 99 mg/dL  Glucose, capillary     Status: Abnormal   Collection Time: 03/16/15 12:04 PM  Result Value Ref Range   Glucose-Capillary 232 (H) 70 - 99 mg/dL  Glucose, capillary     Status: Abnormal   Collection Time: 03/16/15  4:49 PM  Result Value Ref Range   Glucose-Capillary 226 (H) 70 - 99 mg/dL  Glucose, capillary     Status: Abnormal   Collection Time: 03/16/15  9:19 PM  Result Value Ref Range   Glucose-Capillary 190 (H) 70 - 99 mg/dL   No results found.  Assessment/Plan: Diagnosis: debility/gait disorder 1. Does the need for close, 24 hr/day medical supervision in concert with the patient's rehab needs make it unreasonable for this patient to be served in a less intensive setting? Yes 2. Co-Morbidities requiring supervision/potential complications: dm, ckd, gerd, lbbb, cad, copd 3. Due to bladder management, bowel management, safety, skin/wound care, disease management, medication administration, pain management and patient education, does the patient require 24 hr/day rehab nursing? Yes 4. Does the patient require coordinated care of a physician, rehab nurse, PT (1-2 hrs/day, 5 days/week) and OT (1-2 hrs/day, 5 days/week) to address physical and functional deficits in the context of the above medical diagnosis(es)? Yes Addressing deficits in the following areas: balance, endurance, locomotion, strength, transferring, bowel/bladder control, bathing,  dressing, feeding, grooming and psychosocial support 5. Can the patient actively participate in an intensive therapy program of  at least 3 hrs of therapy per day at least 5 days per week? Yes 6. The potential for patient to make measurable gains while on inpatient rehab is excellent 7. Anticipated functional outcomes upon discharge from inpatient rehab are supervision and min assist  with PT, supervision and min assist with OT, n/a with SLP. 8. Estimated rehab length of stay to reach the above functional goals is: 13-17 days 9. Does the patient have adequate social supports and living environment to accommodate these discharge functional goals? Yes 10. Anticipated D/C setting: Home 11. Anticipated post D/C treatments: HH therapy 12. Overall Rehab/Functional Prognosis: excellent  RECOMMENDATIONS: This patient's condition is appropriate for continued rehabilitative care in the following setting: CIR Patient has agreed to participate in recommended program. Yes Note that insurance prior authorization may be required for reimbursement for recommended care.  Comment: Rehab Admissions Coordinator to follow up. Likely having some GERD vs esophageal dysmotility. May need to evaluate this further on inpatient rehab. Doesn't look like further SLP is needed after speaking to SLP today.  Thanks,  Ranelle Oyster, MD, [place] [facility]     03/17/2015

## 2015-03-17 NOTE — Progress Notes (Signed)
PMR Admission Coordinator Pre-Admission Assessment  Patient: Frank Mclean is an 74 y.o., male MRN: [id number] DOB: 1941/06/29 Height:  (188 cm) Weight: 95.5 kg (210 lb 8.6 oz)  Insurance Information HMO: PPO: PCP: IPA: 80/20: yes OTHER: no HMO PRIMARY: Medicare a and b Policy#: [id number] Subscriber: pt Benefits: Phone #: palmetto online Name: 03/17/15 Eff. Date: a 05/20/04 b 06/19/04 Deduct: $1288 Out of Pocket Max: none Life Max: none CIR: 100% SNF: 20 full days last in SNF one year ago Outpatient: 80% Co-Pay: 20% Home Health: 100% Co-Pay: none DME: 80% Co-Pay: 20% Providers: pt choice  SECONDARY: Federal BCBS Policy#Elvera Lennox [id number] Subscriber: wife  Medicaid Application Date: Case Manager:  Disability Application Date: Case Worker:   Emergency Contact Information Contact Information    Name Relation Home Work Mobile   [place] Spouse [phone]  [phone]   Smith,Brandon    [phone]     Current Medical History  Patient Admitting Diagnosis: debility/gait disorder  History of Present Illness: Frank Mclean is a 74 y.o. right handed male with history of chronic renal insufficiency with baseline creatinine 2.28-2.75, long-standing nonischemic cardiomyopathy, systolic congestive heart failure, left bundle branch block, atrial fibrillation with chronic Coumadin. Patient lives with wife used a Youth worker prior to admission. Recent admission 03/10/2015 with acute on chronic heart failure improved with diuresis and ICD implanted on 03/12/15 and unable to place LV lead due to unsatisfactory anatomy. Recommended appointment to see Dr. Laneta Simmers for evaluation for epicardial LV lead. Has appointment on 04/03/15. Discharged home  03/13/2015. He was discharged to home with noted increasing shortness of breath over the past 2 days as well as bouts of vomiting. Patient also with recent fall while at home.  He was readmitted for ongoing evaluation 03/14/2015. Cranial CT scan negative for acute changes. CT of the chest with patchy bibasal or air space opacities concerning for multifocal pneumonia. Placed on broad-spectrum antibiotics. Patient remains on chronic Coumadin therapy.   Past Medical History  Past Medical History  Diagnosis Date  . Gout   . HTN (hypertension)   . Hyperlipidemia   . Osteoarthritis   . History of CVA (cerebrovascular accident)     Left pontine infarct July 2004; changed from Plavix to Coumadin in 2004 per MD at [facility]  . GERD (gastroesophageal reflux disease)   . DJD (degenerative joint disease)   . BPH (benign prostatic hypertrophy)   . Peripheral vascular disease   . Bilateral leg ulcer     ACHILLES AREA-- NONHEALING  . CKD (chronic kidney disease) stage 3, GFR 30-59 ml/min   . CAD (coronary artery disease)     a. LHC 4/12: Mid LAD 25%, mid diagonal 30%, AV circumflex 40%, proximal OM 25%, distal RCA 40%  . Chronic combined systolic and diastolic heart failure     a. Echo 05/2013: EF 25-30%, diffuse HK, restrictive physiology, trivial AI, mild MR, moderate LAE, reduced RV systolic function, PASP 42  . LBBB (left bundle branch block)   . PAF (paroxysmal atrial fibrillation)     a. amiodarone Rx started 05/2013; b. chronic coumadin  . NICM (nonischemic cardiomyopathy)     a. EF 25-30%.  [name] Hx of cardiovascular stress test     Nuclear Stress Test (1/16): High risk stress nuclear study with a large, severe, partially reversible inferior and apical defect consistent with prior inferior and apical infarct; mild apical ischemia; severe LVE; study high risk due to reduced LV function. EF 25% >> reviewed with Dr. Antoine Poche  >>  medical mgmt  . Hypothyroidism   . DM (diabetes mellitus), type 2     Family History  family history includes Diabetes in his mother; Gout in his other; Heart attack in his maternal grandmother; Heart disease in his father and mother; Hypertension in his father and mother; Stroke in his other. There is no history of Thyroid disease.  Prior Rehab/Hospitalizations: BLE injury form his woodshop. Has been in [facility] last year for about 40 days. Pt does not want to go to SNF ever again.  Current Medications   Current facility-administered medications:  . acetaminophen (TYLENOL) tablet 650 mg, 650 mg, Oral, Q6H PRN, Lorretta Harp, MD, 650 mg at 03/15/15 1720 . amLODipine (NORVASC) tablet 10 mg, 10 mg, Oral, Daily, Lorretta Harp, MD, 10 mg at 03/17/15 0934 . atorvastatin (LIPITOR) tablet 20 mg, 20 mg, Oral, Daily, Lorretta Harp, MD, 20 mg at 03/17/15 0934 . budesonide-formoterol (SYMBICORT) 160-4.5 MCG/ACT inhaler 2 puff, 2 puff, Inhalation, BID, Lorretta Harp, MD, 2 puff at 03/16/15 2132 . carvedilol (COREG) tablet 12.5 mg, 12.5 mg, Oral, BID WC, Lorretta Harp, MD, 12.5 mg at 03/17/15 0900 . ceFEPIme (MAXIPIME) 1 g in dextrose 5 % 50 mL IVPB, 1 g, Intravenous, Q24H, Veronda P Bryk, RPH, 1 g at 03/17/15 0612 . cholecalciferol (VITAMIN D) tablet 1,000 Units, 1,000 Units, Oral, Daily, Lorretta Harp, MD, 1,000 Units at 03/17/15 [phone] . escitalopram (LEXAPRO) tablet 5 mg, 5 mg, Oral, QHS, Lorretta Harp, MD, 5 mg at 03/16/15 2330 . feeding supplement (ENSURE ENLIVE) (ENSURE ENLIVE) liquid 237 mL, 237 mL, Oral, BID BM, Jenifer A Williams, RD, 237 mL at 03/16/15 1346 . ferrous sulfate tablet 325 mg, 325 mg, Oral, TID WC, Leroy Sea, MD, 325 mg at 03/17/15 0900 . hydrALAZINE (APRESOLINE) tablet 100 mg, 100 mg, Oral, TID, Lorretta Harp, MD, 100 mg at 03/17/15 0934 . HYDROcodone-acetaminophen (NORCO/VICODIN) 5-325 MG per tablet 1 tablet, 1 tablet, Oral, Q6H PRN, Leroy Sea, MD, 1  tablet at 03/16/15 2250 . hydrOXYzine (VISTARIL) injection 25 mg, 25 mg, Intramuscular, Q6H PRN, Lorretta Harp, MD . insulin aspart (novoLOG) injection 0-9 Units, 0-9 Units, Subcutaneous, TID WC, Lorretta Harp, MD, 2 Units at 03/17/15 0900 . isosorbide mononitrate (IMDUR) 24 hr tablet 90 mg, 90 mg, Oral, BID, Lorretta Harp, MD, 90 mg at 03/17/15 0933 . levothyroxine (SYNTHROID, LEVOTHROID) tablet 200 mcg, 200 mcg, Oral, QAC breakfast, Lorretta Harp, MD, 200 mcg at 03/17/15 0900 . methylPREDNISolone (MEDROL DOSEPAK) tablet 4 mg, 4 mg, Oral, 4X daily taper, Leroy Sea, MD, 4 mg at 03/17/15 0900 . multivitamin with minerals tablet 1 tablet, 1 tablet, Oral, Daily, Jenifer A Williams, RD, 1 tablet at 03/17/15 0934 . pantoprazole (PROTONIX) EC tablet 40 mg, 40 mg, Oral, BID, Leroy Sea, MD, 40 mg at 03/17/15 9604 . polyethylene glycol (MIRALAX / GLYCOLAX) packet 17 g, 17 g, Oral, Daily, Rolan Lipa, NP, 17 g at 03/17/15 0934 . RESOURCE THICKENUP CLEAR, , Oral, PRN, Leroy Sea, MD . torsemide (DEMADEX) tablet 10 mg, 10 mg, Oral, Daily, Leroy Sea, MD, 10 mg at 03/17/15 0933 . vancomycin (VANCOCIN) IVPB 1000 mg/200 mL premix, 1,000 mg, Intravenous, Q24H, Veronda P Bryk, RPH, 1,000 mg at 03/17/15 0513 . vitamin B-12 (CYANOCOBALAMIN) tablet 1,000 mcg, 1,000 mcg, Oral, Daily, Lorretta Harp, MD, 1,000 mcg at 03/17/15 0933 . warfarin (COUMADIN) tablet 2.5 mg, 2.5 mg, Oral, Once per day on Mon Fri, [place], [place] . [START ON 03/18/2015] warfarin (COUMADIN) tablet 5  mg, 5 mg, Oral, Once per day on Sun Tue Wed Thu Sat, Norva Pavlov, [place] . Warfarin - Pharmacist Dosing Inpatient, , Does not apply, q1800, Juliette Mangle, RPH, 1 each at 03/14/15 1800  Patients Current Diet: DIET DYS 2 Room service appropriate?: Yes; Fluid consistency:: Thin  Precautions / Restrictions Precautions Precautions: Fall, ICD/Pacemaker Precaution Comments: LUE in sling Restrictions Weight  Bearing Restrictions: No   Prior Activity Level Household: limited for past two years. not driving . B LE injuries. sedentary. Pt started on a antidepressant 4 to 6 weeks ago. IN January pt was sedentary and depressed. Now with cardiac issues which he is sedentary in recliner at home. His gout has flared now which makes mobility painful. Pt likes to cook, retired Art gallery manager, like to do his woodworking and painting but has not done in past two years due to BLE injuries/wounds acquired in his woodshop which is downstairs in his full basement.   Home Assistive Devices / Equipment Home Assistive Devices/Equipment: Oxygen (2.5LPM at night) Home Equipment: Dan Humphreys - 2 wheels, [employer] - single point, Shower seat  Prior Functional Level Prior Function Level of Independence: Needs assistance Gait / Transfers Assistance Needed: Mod I with RW over past couple months. Reported good and bad days/ bathed self  ADL's / Homemaking Assistance Needed: Pt reports he jsut started showering again after bil. leg injury ~17 mos ago. He states wife has to assist him with socks, but he is able to perform bathing and dressing, "but it takes some effort"  Current Functional Level Cognition  Overall Cognitive Status: Within Functional Limits for tasks assessed Orientation Level: Oriented X4   Extremity Assessment (includes Sensation/Coordination)  Upper Extremity Assessment: Generalized weakness, LUE deficits/detail LUE: Unable to fully assess due to immobilization (Pt with pacemaker precautions )  Lower Extremity Assessment: Defer to PT evaluation RLE Deficits / Details: grossly 3-/5 RLE: Unable to fully assess due to pain RLE Coordination: decreased fine motor, decreased gross motor LLE Deficits / Details: grossly 3- to 3/5 LLE: Unable to fully assess due to pain LLE Coordination: decreased fine motor, decreased gross motor    ADLs  Overall ADL's : Needs assistance/impaired Eating/Feeding: Independent,  Bed level Grooming: Wash/dry hands, Wash/dry face, Oral care, Brushing hair, Set up, Sitting Upper Body Bathing: Set up, Supervision/ safety, Sitting, Bed level Lower Body Bathing: Maximal assistance, Bed level Upper Body Dressing : Minimal assistance, Sitting Lower Body Dressing: Total assistance, Bed level Toilet Transfer: Total assistance (unable to attempt safely with +1 assist ) Toileting- Clothing Manipulation and Hygiene: Total assistance, Bed level Functional mobility during ADLs: Total assistance General ADL Comments: Pt moved to EOB. Pain 9/10 Rt knee. Unable to move into standing. Unable to access bil. LEs     Mobility  Overal bed mobility: Needs Assistance Bed Mobility: Supine to Sit, Sit to Supine Supine to sit: Mod assist Sit to supine: Min assist General bed mobility comments: Assist for bil. LEs     Transfers  Overall transfer level: Needs assistance Equipment used: 1 person hand held assist, Rolling walker (2 wheeled) Transfers: Sit to/from Stand, [employer] Transfers Sit to Stand: Total assist (x3 attempt and only attained semi-upright position with face) Squat pivot transfers: Total assist General transfer comment: Unable to move sit to stand with +1 assist     Ambulation / Gait / Stairs / Wheelchair Mobility  Ambulation/Gait General Gait Details: unable    Posture / Balance Dynamic Sitting Balance Sitting balance - Comments: Pt with mild posterior  bias, but can correct  Balance Overall balance assessment: Needs assistance Sitting-balance support: Feet supported Sitting balance-Leahy Scale: Fair Sitting balance - Comments: Pt with mild posterior bias, but can correct  Standing balance-Leahy Scale: Zero Standing balance comment: Total assist to attain semi upright position blocking knee. Much like a dead lift.    Special needs/care consideration Oxygen 2.5 liters at hs Bowel mgmt: continent. No BM since admission Bladder  mgmt:foley Diabetic mgmt yes   Previous Home Environment Living Arrangements: Spouse/significant other, Other (Comment) Psychologist, sport and exercise, wife's brother, also lives with pt and assists in h) Lives With: Spouse, Other (Comment) [name], brother in Social worker) Available Help at Discharge: Family, Available 24 hours/day Type of Home: House Home Layout: Two level, Able to live on main level with bedroom/bathroom, Other (Comment) (one level with full basement) Alternate Level Stairs-Rails: Left Alternate Level Stairs-Number of Steps: (from basement) Home Access: Stairs to enter Entrance Stairs-Rails: Right, Left, Can reach both Entrance Stairs-Number of Steps: 5 Bathroom Shower/Tub: Walk-in shower, Other (comment) (has built in seat and also chair placed in shower to assist) Bathroom Toilet: Standard Bathroom Accessibility: Yes How Accessible: Accessible via walker Home Care Services: No [name] has lived with pt and his wife for past two years. He can provide 24/7 min assist level. He drives him to his appointments, etc. Pt's wife went to [place] 03/15/15 through 03/24/15 to visit their oldest daughter. Pt has oldest daughter from his first marriage with Joy then they divorced. Second wife had four children, and then pt remarried Joy 18 years ago. There are tense family dynamics with children, [name], and [name].  Discharge Living Setting Plans for Discharge Living Setting: Patient's home, Lives with (comment), Other (Comment) (spouse and her brother, [name], lives with them) Type of Home at Discharge: House Discharge Home Layout: Able to live on main level with bedroom/bathroom, Other (Comment) (one level home with full basement. basement has his wood sho) Discharge Home Access: Stairs to enter Entrance Stairs-Rails: Right, Left, Can reach both Entrance Stairs-Number of Steps: 5 Discharge Bathroom Shower/Tub: Walk-in shower Discharge Bathroom Toilet: Standard Discharge Bathroom Accessibility: Yes How  Accessible: Accessible via walker Does the patient have any problems obtaining your medications?: No Pt has life alert  Social/Family/Support Systems Patient Roles: Spouse, Parent, Other (Comment) (has one son and 4 daughters) Contact Information: Jabarri Interrante, wife and brother in law, [name] Anticipated Caregiver: wife and [name] Anticipated Industrial/product designer Information: see above Ability/Limitations of Caregiver: wife in [place] 3/26 until 4/4. [name] is available her now with no limitations Caregiver Availability: 24/7 Discharge Plan Discussed with Primary Caregiver: Yes Is Caregiver In Agreement with Plan?: Yes Does Caregiver/Family have Issues with Lodging/Transportation while Pt is in Rehab?: No  Goals/Additional Needs Patient/Family Goal for Rehab: supervision to min assist with PT and OT Expected length of stay: ELOS 13 to 17 days Pt/Family Agrees to Admission and willing to participate: Yes Program Orientation Provided & Reviewed with Pt/Caregiver Including Roles & Responsibilities: Yes  Decrease burden of Care through IP rehab admission: n/a  Possible need for SNF placement upon discharge:If pt fails to progress, I discussed the need for possible SNF rehab after CIR. Pt does not want to return to SNF for he spent approximately 40 days last year in two SNFs after he injured his BLEs.  Patient Condition: This patient's condition remains as documented in the consult dated 03/17/2015, in which the Rehabilitation Physician determined and documented that the patient's condition is appropriate for intensive rehabilitative care in an inpatient rehabilitation facility.  Will admit to inpatient rehab today.  Preadmission Screen Completed By: Clois Dupes, 03/17/2015 12:36 PM ______________________________________________________________________  Discussed status with Dr. Riley Kill on 03/17/2015 at 1256 and received telephone approval for admission today.  Admission  Coordinator: Clois Dupes, time 1257 Date 03/17/2015.          Cosigned by: Ranelle Oyster, MD at 03/17/2015 1:22 PM  Revision History     Date/Time User Provider Type Action   03/17/2015 1:22 PM Ranelle Oyster, MD Physician Cosign   03/17/2015 12:58 PM Standley Brooking, RN Rehab Admission Coordinator Sign

## 2015-03-18 ENCOUNTER — Inpatient Hospital Stay (HOSPITAL_COMMUNITY): Payer: Medicare Other | Admitting: Occupational Therapy

## 2015-03-18 ENCOUNTER — Inpatient Hospital Stay (HOSPITAL_COMMUNITY): Payer: Medicare Other

## 2015-03-18 ENCOUNTER — Telehealth: Payer: Self-pay | Admitting: *Deleted

## 2015-03-18 DIAGNOSIS — E1121 Type 2 diabetes mellitus with diabetic nephropathy: Secondary | ICD-10-CM

## 2015-03-18 LAB — CBC WITH DIFFERENTIAL/PLATELET
BASOS ABS: 0 10*3/uL (ref 0.0–0.1)
Basophils Relative: 0 % (ref 0–1)
Eosinophils Absolute: 0 10*3/uL (ref 0.0–0.7)
Eosinophils Relative: 0 % (ref 0–5)
HCT: 28.5 % — ABNORMAL LOW (ref 39.0–52.0)
HEMOGLOBIN: 8.8 g/dL — AB (ref 13.0–17.0)
Lymphocytes Relative: 4 % — ABNORMAL LOW (ref 12–46)
Lymphs Abs: 0.6 10*3/uL — ABNORMAL LOW (ref 0.7–4.0)
MCH: 25.7 pg — ABNORMAL LOW (ref 26.0–34.0)
MCHC: 30.9 g/dL (ref 30.0–36.0)
MCV: 83.1 fL (ref 78.0–100.0)
MONO ABS: 0.6 10*3/uL (ref 0.1–1.0)
Monocytes Relative: 4 % (ref 3–12)
Neutro Abs: 11.9 10*3/uL — ABNORMAL HIGH (ref 1.7–7.7)
Neutrophils Relative %: 91 % — ABNORMAL HIGH (ref 43–77)
Platelets: 238 10*3/uL (ref 150–400)
RBC: 3.43 MIL/uL — ABNORMAL LOW (ref 4.22–5.81)
RDW: 15.3 % (ref 11.5–15.5)
WBC: 13 10*3/uL — ABNORMAL HIGH (ref 4.0–10.5)

## 2015-03-18 LAB — COMPREHENSIVE METABOLIC PANEL
ALT: 113 U/L — ABNORMAL HIGH (ref 0–53)
AST: 66 U/L — ABNORMAL HIGH (ref 0–37)
Albumin: 2.4 g/dL — ABNORMAL LOW (ref 3.5–5.2)
Alkaline Phosphatase: 75 U/L (ref 39–117)
Anion gap: 6 (ref 5–15)
BILIRUBIN TOTAL: 0.6 mg/dL (ref 0.3–1.2)
BUN: 48 mg/dL — ABNORMAL HIGH (ref 6–23)
CHLORIDE: 105 mmol/L (ref 96–112)
CO2: 28 mmol/L (ref 19–32)
Calcium: 8.7 mg/dL (ref 8.4–10.5)
Creatinine, Ser: 2.43 mg/dL — ABNORMAL HIGH (ref 0.50–1.35)
GFR, EST AFRICAN AMERICAN: 28 mL/min — AB (ref >90)
GFR, EST NON AFRICAN AMERICAN: 24 mL/min — AB (ref >90)
Glucose, Bld: 194 mg/dL — ABNORMAL HIGH (ref 70–99)
Potassium: 3.9 mmol/L (ref 3.5–5.1)
Sodium: 139 mmol/L (ref 135–145)
TOTAL PROTEIN: 5.1 g/dL — AB (ref 6.0–8.3)

## 2015-03-18 LAB — GLUCOSE, CAPILLARY
GLUCOSE-CAPILLARY: 180 mg/dL — AB (ref 70–99)
Glucose-Capillary: 177 mg/dL — ABNORMAL HIGH (ref 70–99)
Glucose-Capillary: 232 mg/dL — ABNORMAL HIGH (ref 70–99)
Glucose-Capillary: 221 mg/dL — ABNORMAL HIGH (ref 70–99)

## 2015-03-18 LAB — PROTIME-INR
INR: 2.56 — AB (ref 0.00–1.49)
Prothrombin Time: 27.7 seconds — ABNORMAL HIGH (ref 11.6–15.2)
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
## 2015-03-18 NOTE — Telephone Encounter (Signed)
Transition Care Management Follow-up Telephone Call D/C 03/17/15  How have you been since you were released from the hospital? Called pt spoke with wife she stated that they sent him to rehab at [facility], but he seem to be doing well   Do you understand why you were in the hospital? YES   Do you understand the discharge instrcutions? YES  Items Reviewed:  Medications reviewed: NO, since she was not at home  Allergies reviewed: NO, since she was not home  Dietary changes reviewed: NO  Referrals reviewed: No referrals needed   Functional Questionnaire:   Activities of Daily Living (ADLs):   Wife states he is independent in the followinambulation, bathing and hygiene, feeding, continence, grooming, toileting and dressing States he really doesn't need assistance but since he was c/o of being weak help him if he need too   Any transportation issues/concerns?: NO   Any patient concerns? nO   Confirmed importance and date/time of follow-up visits scheduled: YES, appt was already made for 03/27/15 w/Dr Plot, but wife states might have to reschedule if they keep him longer in rehab.   Confirmed with patient if condition begins to worsen call PCP or go to the ER.

## 2015-03-18 NOTE — Progress Notes (Signed)
ANTICOAGULATION CONSULT NOTE - Follow Up Consult  Pharmacy Consult for Coumadin Indication: atrial fibrillation  Allergies  Allergen Reactions  . Levothyroxine Sodium Other (See Comments)    MUST TAKE BRAND NAME GENERIC DOESN'T WORK FOR PATIENT  . Chicken Protein     Does not like chicken or [place]  . Eggs Or Egg-Derived Products     Does not like eggs    Patient Measurements: Weight: 223 lb 1.7 oz (101.2 kg) Heparin Dosing Weight:   Vital Signs: Temp: 98.7 F (37.1 C) (03/29 0528) Temp Source: Oral (03/29 0528) BP: 131/69 mmHg (03/29 0841) Pulse Rate: 64 (03/29 0528)  Labs:  Recent Labs  03/16/15 0800 03/17/15 0756 03/18/15 0548  HGB 8.9*  --  8.8*  HCT 29.5*  --  28.5*  PLT 188  --  238  LABPROT 30.8* 33.4* 27.7*  INR 2.93* 3.25* 2.56*  CREATININE 2.74* 2.57* 2.43*    Estimated Creatinine Clearance: 33.4 mL/min (by C-G formula based on Cr of 2.43).   Medications:  Scheduled:  . amLODipine  10 mg Oral Daily  . amoxicillin-clavulanate  1 tablet Oral 3 times per day  . atorvastatin  20 mg Oral Daily  . budesonide-formoterol  2 puff Inhalation BID  . carvedilol  12.5 mg Oral BID WC  . cholecalciferol  1,000 Units Oral Daily  . escitalopram  5 mg Oral QHS  . feeding supplement (ENSURE ENLIVE)  237 mL Oral BID BM  . ferrous sulfate  325 mg Oral TID WC  . hydrALAZINE  100 mg Oral TID  . insulin aspart  0-9 Units Subcutaneous TID WC  . isosorbide mononitrate  90 mg Oral BID  . levothyroxine  200 mcg Oral QAC breakfast  . methylPREDNISolone  4 mg Oral 4X daily taper  . multivitamin with minerals  1 tablet Oral Daily  . pantoprazole  40 mg Oral BID  . polyethylene glycol  17 g Oral Daily  . torsemide  10 mg Oral Daily  . cyanocobalamin  1,000 mcg Oral Daily  . [START ON 03/21/2015] warfarin  2.5 mg Oral Once per day on Mon Fri  . warfarin  5 mg Oral Once per day on Sun Tue Wed Thu Sat  . Warfarin - Pharmacist Dosing Inpatient   Does not apply q1800     Assessment: 73yo male on Coumadin 5mg  daily x 2.5mg  Mon/Fri for AFib.  INR 2.56 today.  Hg 8.8 and stable, pltc wnl.  No bleeding noted.    Goal of Therapy:  INR 2-3 Monitor platelets by anticoagulation protocol: Yes   Plan:  Continue current dosing Daily INR  Marisue Humble, PharmD Clinical Pharmacist [facility]

## 2015-03-18 NOTE — Progress Notes (Signed)
Patient information reviewed and entered into eRehab system by Naelle Diegel, RN, CRRN, PPS Coordinator.  Information including medical coding and functional independence measure will be reviewed and updated through discharge.     Per nursing patient was given "Data Collection Information Summary for Patients in Inpatient Rehabilitation Facilities with attached "Privacy Act Statement-Health Care Records" upon admission.  
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
## 2015-03-18 NOTE — Progress Notes (Signed)
Occupational Therapy Session Note  Patient Details  Name: Frank Mclean MRN: [id number] Date of Birth: 09/24/1941  Today's Date: 03/18/2015 OT Individual Time: 1791-5056 OT Individual Time Calculation (min): 30 min    Skilled Therapeutic Interventions/Progress Updates:   Pt practiced walk-in shower transfers using the simulated shower edge.  He needed mod assist for stand pivot transfers using the RW to step over the edge of the simulated shower.  Pt reports having a shower seat at home as well as having a hand held shower.  Pt needed mod instructional cueing for hand placement with transfers as well as for use of the RW as he attempted to back up to get back to the wheelchair instead of ambulating forward and then turning.  He finished session with transfer back to his bed in the room with mod assist.    Therapy Documentation Precautions:  Precautions Precautions: Fall, ICD/Pacemaker Precaution Comments: will need sx again to complete pacemaker placement Required Braces or Orthoses: Sling Restrictions Weight Bearing Restrictions: No Other Position/Activity Restrictions: L arm in sling  Pain: Pain Assessment Pain Assessment: No/denies pain ADL: See FIM for current functional status  Therapy/Group: Individual Therapy  Kimorah Ridolfi OTR/L 03/18/2015, 4:24 PM

## 2015-03-18 NOTE — Progress Notes (Addendum)
Inpatient Rehabilitation Center Individual Statement of Services  Patient Name:  Frank Mclean  Date:  03/18/2015  Welcome to the Inpatient Rehabilitation Center.  Our goal is to provide you with an individualized program based on your diagnosis and situation, designed to meet your specific needs.  With this comprehensive rehabilitation program, you will be expected to participate in at least 3 hours of rehabilitation therapies Monday-Friday, with modified therapy programming on the weekends.  Your rehabilitation program will include the following services:  Physical Therapy (PT), Occupational Therapy (OT), Speech Therapy (ST), 24 hour per day rehabilitation nursing, Neuropsychology, Case Management (Social Worker), Rehabilitation Medicine, Nutrition Services and Pharmacy Services  Weekly team conferences will be held on Tuesdays to discuss your progress.  Your Social Worker will talk with you frequently to get your input and to update you on team discussions.  Team conferences with you and your family in attendance may also be held.  Expected length of stay:  10 to 14 days                                   Overall anticipated outcome:  Supervision with minimal assistance with stairs  Depending on your progress and recovery, your program may change. Your Social Worker will coordinate services and will keep you informed of any changes. Your Social Worker's name and contact numbers are listed  below.  The following services may also be recommended but are not provided by the Inpatient Rehabilitation Center:   Driving Evaluations  Home Health Rehabiltiation Services  Outpatient Rehabilitation Services   Arrangements will be made to provide these services after discharge if needed.  Arrangements include referral to agencies that provide these services.  Your insurance has been verified to be:  Medicare and [employer] Your primary doctor is:  Dr. Macarthur Critchley Plotnikov  Pertinent information will  be shared with your doctor and your insurance company.  Social Worker:  Staci Acosta, LCSW  [phone] or ([phone]  Information discussed with and copy given to patient by: Elvera Lennox, 03/18/2015, 1:11 PM

## 2015-03-18 NOTE — Progress Notes (Signed)
  [facility] PHYSICAL MEDICINE & REHABILITATION     PROGRESS NOTE    Subjective/Complaints: Had a good night. Denies cough, sob. Able to sleep. No n/v/d  Objective: Vital Signs: Blood pressure 131/69, pulse 64, temperature 98.7 F (37.1 C), temperature source Oral, resp. rate 18, weight 101.2 kg (223 lb 1.7 oz), SpO2 98 %. No results found.  Recent Labs  03/16/15 0800 03/18/15 0548  WBC 13.4* 13.0*  HGB 8.9* 8.8*  HCT 29.5* 28.5*  PLT 188 238    Recent Labs  03/17/15 0756 03/18/15 0548  NA 141 139  K 4.2 3.9  CL 108 105  GLUCOSE 203* 194*  BUN 42* 48*  CREATININE 2.57* 2.43*  CALCIUM 8.7 8.7   CBG (last 3)   Recent Labs  03/17/15 1730 03/17/15 2119 03/18/15 0645  GLUCAP 169* 214* 180*    Wt Readings from Last 3 Encounters:  03/18/15 101.2 kg (223 lb 1.7 oz)  03/14/15 95.5 kg (210 lb 8.6 oz)  03/13/15 92.08 kg (203 lb)    Physical Exam:  Constitutional: He is oriented to person, place, and time. He appears well-developed.  HENT: PERRL. Dentition fair Head: Normocephalic.  Eyes: EOM are normal.  Neck: Normal range of motion. Neck supple. No thyromegaly present.  Cardiovascular:  Cardiac rate controlled without murmur Respiratory:  Lungs decreased breath sounds at the bases but clear to auscultation  GI: Soft. Bowel sounds are normal. He exhibits no distension.  Neurological: He is alert and oriented to person, place, and time.  Follow simple commands. Moves all 4's.3-4/5 UE's and 2+ hf, 3 ke and 4/5 ankles  Skin: Skin is warm and dry.  Psychiatric: He has a normal mood and affect. His behavior is normal. Thought content normal   Assessment/Plan: 1. Functional deficits secondary to debility and gait disorder which require 3+ hours per day of interdisciplinary therapy in a comprehensive inpatient rehab setting. Physiatrist is providing close team supervision and 24 hour management of active medical problems listed below. Physiatrist and rehab  team continue to assess barriers to discharge/monitor patient progress toward functional and medical goals. FIM:                                  Medical Problem List and Plan: 1. Functional deficits secondary to Debility /HCAP/Gait disorder 2. DVT Prophylaxis/Anticoagulation: Chronic coumadin/A fib. 3. Pain Management: Hydrocodone as needed. 4. Mood.Lexapro 5 mg daily 5. Neuropsych: This patient is capable of making decisions on her own behalf. 6. Skin/Wound Care: Routine skin checks 7. Fluids/Electrolytes/Nutrition: strict I&O . 8.ID/HCAP. Augmentin 500 mg 3 times a day until 03/23/2015. 9.LBBB./non ischemic cardiomyopathy.Follow up cardiology on defibrillator in reference to being unable to place LV lead due to no appropriate veins and plan follow-up 10.CRI.Baseline Creatinine 2.28-2.75.follow up labs 11. Hypertension. Norvasc 10 mg daily, Coreg 12.5 mg twice a day, hydralazine 100 mg 3 times a day, Imdur 90 mg twice a day, Demadex 10 mg daily. Monitor with increased mobility 12. GERD versus esophageal dysmotility Dysphagia 2 thin liquid diet. Advance diet as tolerated 13. Hypothyroidism. Synthroid 14. Hyperlipidemia. Lipitor 15. Acute on chronic anemia. Continue iron supplement. Follow-up hgb today 8.8 16. COPD. Symbicort   LOS (Days) 1 A FACE TO FACE EVALUATION WAS PERFORMED  Jenniah Bhavsar T 03/18/2015 8:44 AM
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
## 2015-03-18 NOTE — Progress Notes (Signed)
INITIAL NUTRITION ASSESSMENT  DOCUMENTATION CODES Per approved criteria  -Not Applicable   INTERVENTION: Continue Ensure Enlive po BID, each supplement provides 350 kcal and 20 grams of protein.  Encourage adequate PO intake.  NUTRITION DIAGNOSIS: Increased nutrient needs related to therapy as evidenced by estimated nutrition needs.   Goal: Pt to meet >/= 90% of their estimated nutrition needs   Monitor:  PO intake, weight trends, labs, I/O's  Reason for Assessment: MST  74 y.o. male  Admitting Dx: Debility  ASSESSMENT: Pt with history of chronic renal insufficiency, long-standing nonischemic cardiomyopathy, systolic congestive heart failure, left bundle branch block, atrial fibrillation, DM. CT of the chest with patchy bibasal or air space opacities concerning for multifocal pneumonia. Cardiology to follow-up on current plan for defibrillator.  Pt reports having a good appetite currently. Meal completion has been 90%. PTA he reports appetite was low with 1 meal a day with Ensure twice daily. Per Epic weight records, weight has been stable, however pt reports having fluctuations in weight due to fluid. Pt currently has Ensure ordered. Will continue with current orders. Pt was encouraged to eat his food at meals and to drink his supplements.  Pt with no observed significant fat or muscle mass loss.  Labs: Low GFR. High BUN, creatinine, AST, ALT.  Height: Ht Readings from Last 1 Encounters:  03/14/15  (1.88 m)    Weight: Wt Readings from Last 1 Encounters:  03/18/15 223 lb 1.7 oz (101.2 kg)    Ideal Body Weight: 190 lbs  % Ideal Body Weight: 117%  Wt Readings from Last 10 Encounters:  03/18/15 223 lb 1.7 oz (101.2 kg)  03/14/15 210 lb 8.6 oz (95.5 kg)  03/13/15 203 lb (92.08 kg)  03/05/15 224 lb 12.8 oz (101.969 kg)  02/26/15 221 lb 12.8 oz (100.608 kg)  02/10/15 218 lb (98.884 kg)  02/06/15 220 lb 12.8 oz (100.154 kg)  02/03/15 222 lb (100.699 kg)   01/27/15 222 lb (100.699 kg)  01/23/15 224 lb (101.606 kg)   Usual Body Weight: 220 lbs  % Usual Body Weight: 101%  BMI:  Body mass index is 28.63 kg/(m^2).  Estimated Nutritional Needs: Kcal: 2100-2400 Protein: 110-120 grams Fluid: 2.2 - 2.4 L/day  Skin: Incision on left chest  Diet Order: DIET DYS 2 Room service appropriate?: Yes; Fluid consistency:: Thin  EDUCATION NEEDS: -No education needs identified at this time   Intake/Output Summary (Last 24 hours) at 03/18/15 1020 Last data filed at 03/18/15 0530  Gross per 24 hour  Intake      0 ml  Output   1000 ml  Net  -1000 ml    Last BM: 3/28  Labs:   Recent Labs Lab 03/16/15 0800 03/17/15 0756 03/18/15 0548  NA 141 141 139  K 3.7 4.2 3.9  CL 108 108 105  CO2 BUN 33* 42* 48*  CREATININE 2.74* 2.57* 2.43*  CALCIUM 8.7 8.7 8.7  GLUCOSE 206* 203* 194*    CBG (last 3)   Recent Labs  03/17/15 1730 03/17/15 2119 03/18/15 0645  GLUCAP 169* 214* 180*    Scheduled Meds: . amLODipine  10 mg Oral Daily  . amoxicillin-clavulanate  1 tablet Oral 3 times per day  . atorvastatin  20 mg Oral Daily  . budesonide-formoterol  2 puff Inhalation BID  . carvedilol  12.5 mg Oral BID WC  . cholecalciferol  1,000 Units Oral Daily  . escitalopram  5 mg Oral QHS  . feeding  supplement (ENSURE ENLIVE)  237 mL Oral BID BM  . ferrous sulfate  325 mg Oral TID WC  . hydrALAZINE  100 mg Oral TID  . insulin aspart  0-9 Units Subcutaneous TID WC  . isosorbide mononitrate  90 mg Oral BID  . levothyroxine  200 mcg Oral QAC breakfast  . methylPREDNISolone  4 mg Oral 4X daily taper  . multivitamin with minerals  1 tablet Oral Daily  . pantoprazole  40 mg Oral BID  . polyethylene glycol  17 g Oral Daily  . torsemide  10 mg Oral Daily  . cyanocobalamin  1,000 mcg Oral Daily  . [START ON 03/21/2015] warfarin  2.5 mg Oral Once per day on Mon Fri  . warfarin  5 mg Oral Once per day on Sun Tue Wed Thu Sat  . Warfarin -  Pharmacist Dosing Inpatient   Does not apply q1800    Continuous Infusions:   Past Medical History  Diagnosis Date  . Gout   . HTN (hypertension)   . Hyperlipidemia   . Osteoarthritis   . History of CVA (cerebrovascular accident)     Left pontine infarct July 2004; changed from Plavix to Coumadin in 2004 per MD at [facility]  . GERD (gastroesophageal reflux disease)   . DJD (degenerative joint disease)   . BPH (benign prostatic hypertrophy)   . Peripheral vascular disease   . Bilateral leg ulcer     ACHILLES AREA--  NONHEALING  . CKD (chronic kidney disease) stage 3, GFR 30-59 ml/min   . CAD (coronary artery disease)     a. LHC 4/12: Mid LAD 25%, mid diagonal 30%, AV circumflex 40%, proximal OM 25%, distal RCA 40%  . Chronic combined systolic and diastolic heart failure     a. Echo 05/2013: EF 25-30%, diffuse HK, restrictive physiology, trivial AI, mild MR, moderate LAE, reduced RV systolic function, PASP 42  . LBBB (left bundle branch block)   . PAF (paroxysmal atrial fibrillation)     a. amiodarone Rx started 05/2013;  b. chronic coumadin  . NICM (nonischemic cardiomyopathy)     a. EF 25-30%.  [name] Hx of cardiovascular stress test     Nuclear Stress Test (1/16): High risk stress nuclear study with a large, severe, partially reversible inferior and apical defect consistent with prior inferior and apical infarct; mild apical ischemia; severe LVE; study high risk due to reduced LV function.  EF 25% >> reviewed with Dr. Antoine Poche >> medical mgmt  . Hypothyroidism   . DM (diabetes mellitus), type 2     Past Surgical History  Procedure Laterality Date  . Cardiac catheterization  04-16-2011   DR [facility]    NON-OBSTRUCTIVE CAD. MILDLY ELEVATED PULMONARY PRESSURES/ ELEVATED  END-DIASTOLIC PRESSURE  . Transthoracic echocardiogram  12-31-2010    MODERATE CONCENTRIC LVH/ SYSTOLIC FUNCTION SEVERELY REDUCED/ EF 25-30%/  SEVERE HYPOKINESIS OF ANTEROSEPTAL MYOCARDIUM  AND ENTIREAPICAL MYOCARDIUM /   MODERATE HYPOKINESIS OF LATERAL, INFEROLATERAL, INFERIOR,AND INFEROSEPTAL MYOCARIUM/  GRADE 3 DIASTOLIC DYSFUNCTION/ MILD MR  . Aortogram w/ bilateral lower extremitiy runoff  05-18-2013  DR FIELDS    LEFT LEG OCCLUDED PERONEAL AND ANTERIOR TIBIAL ARTERIES/ HIGH GRADE STENOIS 80% MIDDLE AND DISTAL THIRD OF POSTERIOR TIBIAL ARTERY/ RIGHT PERONEAL AND ANTERIOR TIBIAL ARTERY OCCLUDED/ 40% STENOSIS DISTALLY   . Abdominal aortagram N/A 05/18/2013    Procedure: ABDOMINAL Ronny Flurry;  Surgeon: Sherren Kerns, MD;  Location: [facility] CATH LAB;  Service: Cardiovascular;  Laterality: N/A;  . Lower extremity angiogram Bilateral 05/18/2013  Procedure: LOWER EXTREMITY ANGIOGRAM;  Surgeon: Sherren Kerns, MD;  Location: [facility] CATH LAB;  Service: Cardiovascular;  Laterality: Bilateral;  . Right heart catheterization N/A 02/13/2015    Procedure: RIGHT HEART CATH;  Surgeon: Laurey Morale, MD;  Location: [facility] CATH LAB;  Service: Cardiovascular;  Laterality: N/A;  . Bi-ventricular implantable cardioverter defibrillator N/A 03/12/2015    Procedure: BI-VENTRICULAR IMPLANTABLE CARDIOVERTER DEFIBRILLATOR  (CRT-D);  Surgeon: Marinus Maw, MD;  Location: [facility] CATH LAB;  Service: Cardiovascular;  Laterality: N/A;    Marijean Niemann, MS, RD, LDN Pager # [phone] After hours/ weekend pager # [phone]

## 2015-03-19 ENCOUNTER — Inpatient Hospital Stay (HOSPITAL_COMMUNITY): Payer: Medicare Other

## 2015-03-19 ENCOUNTER — Inpatient Hospital Stay (HOSPITAL_COMMUNITY): Payer: Medicare Other | Admitting: Occupational Therapy

## 2015-03-19 ENCOUNTER — Inpatient Hospital Stay (HOSPITAL_COMMUNITY): Payer: Medicare Other | Admitting: Speech Pathology

## 2015-03-19 LAB — GLUCOSE, CAPILLARY
GLUCOSE-CAPILLARY: 159 mg/dL — AB (ref 70–99)
GLUCOSE-CAPILLARY: 185 mg/dL — AB (ref 70–99)
Glucose-Capillary: 156 mg/dL — ABNORMAL HIGH (ref 70–99)
Glucose-Capillary: 198 mg/dL — ABNORMAL HIGH (ref 70–99)

## 2015-03-19 LAB — RESPIRATORY VIRUS PANEL
Adenovirus: NEGATIVE
INFLUENZA A: NEGATIVE
INFLUENZA B 1: NEGATIVE
Metapneumovirus: NEGATIVE
PARAINFLUENZA 1 A: NEGATIVE
PARAINFLUENZA 2 A: NEGATIVE
Parainfluenza 3: NEGATIVE
RESPIRATORY SYNCYTIAL VIRUS A: NEGATIVE
RHINOVIRUS: NEGATIVE
Respiratory Syncytial Virus B: NEGATIVE

## 2015-03-19 LAB — PROTIME-INR
INR: 2.45 — ABNORMAL HIGH (ref 0.00–1.49)
Prothrombin Time: 26.8 seconds — ABNORMAL HIGH (ref 11.6–15.2)

## 2015-03-19 MED ORDER — GLUCERNA SHAKE PO LIQD
237.0000 mL | Freq: Three times a day (TID) | ORAL | Status: DC
  Administered 2015-03-19 – 2015-03-23 (×10): 237 mL via ORAL

## 2015-03-19 MED ORDER — LIDOCAINE HCL 2 % EX GEL
CUTANEOUS | Status: DC | PRN
  Filled 2015-03-19: qty 30

## 2015-03-19 NOTE — IPOC Note (Signed)
Overall Plan of Care [facility]) Patient Details Name: Frank Mclean MRN: [id number] DOB: [birth year]  Admitting Diagnosis: DEBILITY GAIT DISORDER  Hospital Problems: Principal Problem:   Debility Active Problems:   DM (diabetes mellitus), type 2 with renal complications   CKD (chronic kidney disease) stage 3, GFR 30-59 ml/min   COPD with bronchitis   Gait disorder   HCAP (healthcare-associated pneumonia)     Functional Problem List: Nursing Bladder, Bowel, Edema, Endurance, Medication Management, Pain, Safety, Skin Integrity  PT Balance, Endurance, Skin Integrity, Motor  OT Balance, Endurance, Motor, Safety  SLP Nutrition  TR         Basic ADL's: OT Bathing, Dressing, Toileting     Advanced  ADL's: OT Simple Meal Preparation     Transfers: PT Bed Mobility, Bed to Chair, Car, Occupational psychologist, Research scientist (life sciences): PT Ambulation, Stairs     Additional Impairments: OT    SLP Swallowing      TR      Anticipated Outcomes Item Anticipated Outcome  Self Feeding mod I  Swallowing  Mod I    Basic self-care  mod I  Toileting  supervision   Bathroom Transfers supervision  Bowel/Bladder  Min assist  Transfers  S overall  Locomotion  S gait; min A stairs  Communication     Cognition     Pain  <2 0n a 0-10 scale  Safety/Judgment  min assist   Therapy Plan: PT Intensity: Minimum of 1-2 x/day ,45 to 90 minutes PT Frequency: 5 out of 7 days PT Duration Estimated Length of Stay: 10-14 days OT Intensity: Minimum of 1-2 x/day, 45 to 90 minutes OT Frequency: 5 out of 7 days OT Duration/Estimated Length of Stay: 7-10 days SLP Intensity: Minumum of 1-2 x/day, 30 to 90 minutes SLP Frequency: 1 to 3 out of 7 days SLP Duration/Estimated Length of Stay: 1 week for SLP       Team Interventions: Nursing Interventions Patient/Family Education, Bladder Management, Bowel Management, Disease Management/Prevention, Pain Management, Medication Management, Skin  Care/Wound Management, Dysphagia/Aspiration Precaution Training, Discharge Planning, Psychosocial Support  PT interventions Ambulation/gait training, Warden/ranger, Community reintegration, Discharge planning, Disease management/prevention, DME/adaptive equipment instruction, Functional mobility training, Neuromuscular re-education, Pain management, Patient/family education, Skin care/wound management, Psychosocial support, Splinting/orthotics, Stair training, Therapeutic Activities, Therapeutic Exercise, UE/LE Strength taining/ROM, UE/LE Coordination activities, Wheelchair propulsion/positioning  OT Interventions Warden/ranger, Firefighter, Discharge planning, Fish farm manager, Patient/family education, Self Care/advanced ADL retraining  SLP Interventions Cueing hierarchy, Dysphagia/aspiration precaution training, Functional tasks, Environmental controls, Internal/external aids, Patient/family education  TR Interventions    SW/CM Interventions Discharge Planning, Facilities manager, Patient/Family Education    Team Discharge Planning: Destination: PT-Home ,OT- Home , SLP-Home Projected Follow-up: PT-Home health PT, OT-  Home health OT, SLP-None Projected Equipment Needs: PT-None recommended by PT, OT- To be determined, SLP-None recommended by SLP Equipment Details: PT-Pt already owns RW and SPC, OT-  Patient/family involved in discharge planning: PT- Patient,  OT-Patient, SLP-Patient  MD ELOS: 10-12 days Medical Rehab Prognosis:  Excellent Assessment: The patient has been admitted for CIR therapies with the diagnosis of debility. The team will be addressing functional mobility, strength, stamina, balance, safety, adaptive techniques and equipment, self-care, bowel and bladder mgt, patient and caregiver education, pain mgt, activity tolerance, nutrition. Goals have been set at supervision to mod I with self-care and supervision to min  assist with mobility.    Ranelle Oyster, MD, [facility]      See Team  Conference Notes for weekly updates to the plan of care

## 2015-03-19 NOTE — Progress Notes (Addendum)
[facility] PHYSICAL MEDICINE & REHABILITATION     PROGRESS NOTE    Subjective/Complaints: No new issues over night. Slept well. Denies pain, sob,c Objective: Vital Signs: Blood pressure 133/70, pulse 54, temperature 97.9 F (36.6 C), temperature source Oral, resp. rate 18, weight 101.2 kg (223 lb 1.7 oz), SpO2 100 %. No results found.  Recent Labs  03/18/15 0548  WBC 13.0*  HGB 8.8*  HCT 28.5*  PLT 238    Recent Labs  03/17/15 0756 03/18/15 0548  NA 141 139  K 4.2 3.9  CL 108 105  GLUCOSE 203* 194*  BUN 42* 48*  CREATININE 2.57* 2.43*  CALCIUM 8.7 8.7   CBG (last 3)   Recent Labs  03/18/15 1633 03/18/15 2145 03/19/15 0650  GLUCAP 232* 221* 156*    Wt Readings from Last 3 Encounters:  03/18/15 101.2 kg (223 lb 1.7 oz)  03/14/15 95.5 kg (210 lb 8.6 oz)  03/13/15 92.08 kg (203 lb)    Physical Exam:  Constitutional: He is oriented to person, place, and time. He appears well-developed.  HENT: PERRL. Dentition fair Head: Normocephalic.  Eyes: EOM are normal.  Neck: Normal range of motion. Neck supple. No thyromegaly present.  Cardiovascular:  Cardiac rate controlled without murmur Respiratory:  Lungs decreased breath sounds at the bases but clear to auscultation otherwise GI: Soft. Bowel sounds are normal. He exhibits no distension.  Neurological: He is alert and oriented to person, place, and time.  Follow simple commands. Moves all 4's.3-4/5 UE's and 2+ hf, 3 ke and 4/5 ankles  Skin: Skin is warm and dry.  Psychiatric: He has a normal mood and affect. His behavior is normal. Thought content normal   Assessment/Plan: 1. Functional deficits secondary to debility and gait disorder which require 3+ hours per day of interdisciplinary therapy in a comprehensive inpatient rehab setting. Physiatrist is providing close team supervision and 24 hour management of active medical problems listed below. Physiatrist and rehab team continue to assess barriers  to discharge/monitor patient progress toward functional and medical goals. FIM: FIM - Bathing Bathing Steps Patient Completed: Chest, Right Arm, Left Arm, Abdomen, Front perineal area, Right upper leg, Left upper leg, Buttocks Bathing: 4: Min-Patient completes 8-9 49f 10 parts or 75+ percent  FIM - Upper Body Dressing/Undressing Upper body dressing/undressing steps patient completed: Thread/unthread right sleeve of pullover shirt/dresss, Thread/unthread left sleeve of pullover shirt/dress, Put head through opening of pull over shirt/dress, Pull shirt over trunk Upper body dressing/undressing: 5: Set-up assist to: Obtain clothing/put away FIM - Lower Body Dressing/Undressing Lower body dressing/undressing steps patient completed: Thread/unthread right pants leg, Thread/unthread left pants leg, Pull pants up/down Lower body dressing/undressing: 4: Min-Patient completed 75 plus % of tasks  FIM - Toileting Toileting steps completed by patient: Performs perineal hygiene Toileting Assistive Devices: Grab bar or rail for support Toileting: 2: Max-Patient completed 1 of 3 steps  FIM - Diplomatic Services operational officer Devices: Environmental consultant, Therapist, music Transfers: 3-To toilet/BSC: Mod A (lift or lower assist), 3-From toilet/BSC: Mod A (lift or lower assist)  FIM - Banker Devices: Walker, Arm rests Bed/Chair Transfer: 5: Supine > Sit: Supervision (verbal cues/safety issues), 3: Bed > Chair or W/C: Mod A (lift or lower assist)  FIM - Locomotion: Wheelchair Locomotion: Wheelchair: 5: Travels 150 ft or more: maneuvers on rugs and over door sills with supervision, cueing or coaxing FIM - Locomotion: Ambulation Locomotion: Ambulation Assistive Devices: Designer, industrial/product Ambulation/Gait Assistance: 4: Min assist Locomotion:  Ambulation: 2: Travels 50 - 149 ft with minimal assistance (Pt.>75%)  Comprehension Comprehension Mode: Auditory Comprehension:  6-Follows complex conversation/direction: With extra time/assistive device  Expression Expression Mode: Verbal Expression: 6-Expresses complex ideas: With extra time/assistive device  Social Interaction Social Interaction: 5-Interacts appropriately 90% of the time - Needs monitoring or encouragement for participation or interaction.        Medical Problem List and Plan: 1. Functional deficits secondary to Debility /HCAP/Gait disorder 2. DVT Prophylaxis/Anticoagulation: Chronic coumadin/A fib. 3. Pain Management: Hydrocodone as needed. 4. Mood.Lexapro 5 mg daily 5. Neuropsych: This patient is capable of making decisions on her own behalf. 6. Skin/Wound Care: Routine skin checks 7. Fluids/Electrolytes/Nutrition: strict I&O . 8.ID/HCAP. Augmentin 500 mg 3 times a day until 03/23/2015.  -wbc's seem to be tredning down  -recheck cbc friday 9.LBBB./non ischemic cardiomyopathy.Follow up cardiology on defibrillator in reference to being unable to place LV lead due to no appropriate veins and plan follow-up 10.CRI.Baseline Creatinine 2.28-2.75 at baseline 11. Hypertension. Norvasc 10 mg daily, Coreg 12.5 mg twice a day, hydralazine 100 mg 3 times a day, Imdur 90 mg twice a day, Demadex 10 mg daily. Monitor with increased mobility 12. GERD versus esophageal dysmotility Dysphagia 2 thin liquid diet. Advance diet as tolerated 13. Hypothyroidism. Synthroid 14. Hyperlipidemia. Lipitor 15. Acute on chronic anemia. Continue iron supplement. Follow-up hgb today 8.8 16. COPD. Symbicort, steroid taper 17. Diabetes: SSI while steroids taper   LOS (Days) 2 A FACE TO FACE EVALUATION WAS PERFORMED  SWARTZ,ZACHARY T 03/19/2015 9:11 AM

## 2015-03-19 NOTE — Progress Notes (Signed)
Physical Therapy Session Note  Patient Details  Name: Frank Mclean MRN: [id number] Date of Birth: Jul 05, 1941  Today's Date: 03/19/2015 PT Individual Time: 0800-0900 PT Individual Time Calculation (min): 60 min   Short Term Goals: Week 1:  PT Short Term Goal 1 (Week 1): Pt will be able to transfer with min A from various surface heights PT Short Term Goal 2 (Week 1): Pt will be able to gait x 150' with min A PT Short Term Goal 3 (Week 1): Pt will be able to do 5 stairs with 1 rail with mod A PT Short Term Goal 4 (Week 1): Pt will demonstrate dynamic standing balance during functional task with min A  Skilled Therapeutic Interventions/Progress Updates:   Focused on sitting balance and postural control seated EOB while eating breakfast and able to maintain balance with S ~ 10 min with no posterior LOB occurring today. Transferred to w/c with RW stand step technique with min A and cues for technique and hand placement. W/c propulsion down to therapy gym with extra time and S for general UE strengthening and endurance. Gait training x 110' with min A with RW with cues for posture and to maintain body inside RW to address functional mobility. Performed 5 time sit to stand test. See results below. Repeated activity for functional strengthening from slightly lower height with min A overall x 5 reps. Functional stair training up/down 2 steps with rails in preparation for home entry with min A and cues for which foot to lead with. Returned to recliner end of session with mod A for sit to stand from w/c and min A to walk to recliner. All needs in reach.     Five times Sit to Stand Test (FTSS) Method: Use a straight back chair with a solid seat that is 16-18" high. Ask participant to sit on the chair with arms folded across their chest.   Instructions: "Stand up and sit down as quickly as possible 5 times, keeping your arms folded across your chest."   Measurement: Stop timing when the participant  stands the 5th time.  TIME: _50___ (in seconds)  Times > 13.6 seconds is associated with increased disability and morbidity (Guralnik, 2000) Times > 15 seconds is predictive of recurrent falls in healthy individuals aged 27 and older (Buatois, et al., 2008) Normal performance values in community dwelling individuals aged 76 and older (Bohannon, 2006): o 60-69 years: 11.4 seconds o 70-79 years: 12.6 seconds o 80-89 years: 14.8 seconds  MCID: ? 2.3 seconds for Vestibular Disorders (Meretta, 2006) Modified test with pt using RW for support and higher surface (21.5") due to increased physical A needed.   Therapy Documentation Precautions:  Precautions Precautions: Fall, ICD/Pacemaker Precaution Comments: will need sx again to complete pacemaker placement Required Braces or Orthoses: Sling Restrictions Weight Bearing Restrictions: No  Pain:  Denies pain.  See FIM for current functional status  Therapy/Group: Individual Therapy  Karolee Stamps Darrol Poke, PT, DPT  03/19/2015, 9:05 AM

## 2015-03-19 NOTE — Progress Notes (Signed)
Occupational Therapy Session Note  Patient Details  Name: Frank Mclean MRN: [id number] Date of Birth: Jul 16, 1941  Today's Date: 03/19/2015 OT Individual Time: 1300-1400 OT Individual Time Calculation (min): 60 min    Short Term Goals: Week 1:  OT Short Term Goal 1 (Week 1): STG=LTG due to short term stay  Skilled Therapeutic Interventions/Progress Updates:    Pt seen for ADL bathing and dressing session. Pt sitting up in recliner upon arrival with RN present. Pt agreeable to showering task. Pt transferred sit >stand from recliner with min A and VCs for technique. Pt ambulated into bathroom with CGA and RW. He completed transfer onto shower tub bench with min steadying assist. He completed showering task with supervision using lateral leans to complete buttock hygiene. He transferred out of shower with min A and returned to w/c at the sink. Pt completed seated dressing task with min A for management of LE clothing and belt, steadying assist when standing to pull pants up, and VCs for modified dressing technique to bring ankle over knee. He stood at the sink to complete oral care and brush hair with initial CG steadying assist and then required mod assist once fatigued due to posterior lean. Following dressing task, pt self propelled w/c throughout unit with supervision and increased time due to fatigue. Focus on UE strengthening and functional activity tolerance. Pt requested to wait in dayroom for next therapy session, left waiting in w/c.     Pt educated regarding role of OT, benefits of OOB, fall risk, safety within the home, and d/c planning.  Therapy Documentation Precautions:  Precautions Precautions: Fall, ICD/Pacemaker Precaution Comments: will need sx again to complete pacemaker placement Required Braces or Orthoses: Sling Restrictions Weight Bearing Restrictions: No Other Position/Activity Restrictions: L arm in sling Pain: Pain Assessment Pain Assessment: No/denies pain  See  FIM for current functional status  Therapy/Group: Individual Therapy  [name], Eryc Bodey C 03/19/2015, 3:03 PM

## 2015-03-19 NOTE — Progress Notes (Signed)
ANTICOAGULATION CONSULT NOTE - Follow Up Consult  Pharmacy Consult for Coumadin Indication: atrial fibrillation  Allergies  Allergen Reactions  . Levothyroxine Sodium Other (See Comments)    MUST TAKE BRAND NAME GENERIC DOESN'T WORK FOR PATIENT  . Chicken Protein     Does not like chicken or [place]  . Eggs Or Egg-Derived Products     Does not like eggs    Patient Measurements: Weight: 223 lb 1.7 oz (101.2 kg)  Vital Signs: Temp: 97.9 F (36.6 C) (03/30 0537) Temp Source: Oral (03/30 0537) BP: 137/67 mmHg (03/30 0918) Pulse Rate: 54 (03/30 0537)  Labs:  Recent Labs  03/17/15 0756 03/18/15 0548 03/19/15 0424  HGB  --  8.8*  --   HCT  --  28.5*  --   PLT  --  238  --   LABPROT 33.4* 27.7* 26.8*  INR 3.25* 2.56* 2.45*  CREATININE 2.57* 2.43*  --     Estimated Creatinine Clearance: 33.4 mL/min (by C-G formula based on Cr of 2.43).  Assessment: 73yom continues on coumadin for afib. INR therapeutic at 2.45. No bleeding reported.  Goal of Therapy:  INR 2-3 Monitor platelets by anticoagulation protocol: Yes   Plan:  1) Continue coumadin 5mg  daily except 2.5mg  on Monday and Friday 2) INR in AM  Fredrik Rigger 03/19/2015,1:51 PM

## 2015-03-19 NOTE — Progress Notes (Signed)
Inpatient Diabetes Program Recommendations  AACE/ADA: New Consensus Statement on Inpatient Glycemic Control (2013)  Target Ranges:  Prepandial:   less than 140 mg/dL      Peak postprandial:   less than 180 mg/dL (1-2 hours)      Critically ill patients:  140 - 180 mg/dL  Results for KAYSEN, FLORA (MRN [id number]) as of 03/19/2015 13:19  Ref. Range 03/18/2015 11:53 03/18/2015 16:33 03/18/2015 21:45 03/19/2015 06:50 03/19/2015 12:56  Glucose-Capillary Latest Range: 70-99 mg/dL 588 (H) 502 (H) 774 (H) 156 (H) 198 (H)   Inpatient Diabetes Program Recommendations Correction (SSI): increase to moderate correction scale during steroid therapy Thank you  Piedad Climes BSN, RN,CDE Inpatient Diabetes Coordinator [phone] (team pager)

## 2015-03-19 NOTE — Progress Notes (Signed)
Physical Therapy Session Note  Patient Details  Name: RAAD BAJEMA MRN: [id number] Date of Birth: [birth year]  Today's Date: 03/19/2015 PT Individual Time: 1500-1530 PT Individual Time Calculation (min): 30 min   Short Term Goals: Week 1:  PT Short Term Goal 1 (Week 1): Pt will be able to transfer with min A from various surface heights PT Short Term Goal 2 (Week 1): Pt will be able to gait x 150' with min A PT Short Term Goal 3 (Week 1): Pt will be able to do 5 stairs with 1 rail with mod A PT Short Term Goal 4 (Week 1): Pt will demonstrate dynamic standing balance during functional task with min A  Skilled Therapeutic Interventions/Progress Updates:   Session focused on functional stair negotiation in preparation for home entry both with bilateral rails (min A) and progressing to 1 rail on L with mod A and cues for technique, dynamic gait through obstacle course with RW with min A and cues for safety to simulate home environment mobility, and basic transfers with RW with improved sit to stand technique and min A. Pt returned to bed end of session with min A for transfer without AD and supervision to return to supine.   Therapy Documentation Precautions:  Precautions Precautions: Fall, ICD/Pacemaker Precaution Comments: will need sx again to complete pacemaker placement Required Braces or Orthoses: Sling Restrictions Weight Bearing Restrictions: No  Pain: Pain Assessment Pain Assessment: No/denies pain  See FIM for current functional status  Therapy/Group: Individual Therapy  Karolee Stamps [facility] 03/19/2015, 3:57 PM
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
## 2015-03-20 ENCOUNTER — Inpatient Hospital Stay (HOSPITAL_COMMUNITY): Payer: Medicare Other | Admitting: Occupational Therapy

## 2015-03-20 ENCOUNTER — Inpatient Hospital Stay (HOSPITAL_COMMUNITY): Payer: Medicare Other | Admitting: Speech Pathology

## 2015-03-20 ENCOUNTER — Inpatient Hospital Stay (HOSPITAL_COMMUNITY): Payer: Medicare Other

## 2015-03-20 ENCOUNTER — Encounter (HOSPITAL_COMMUNITY): Payer: Medicare Other

## 2015-03-20 LAB — GLUCOSE, CAPILLARY
GLUCOSE-CAPILLARY: 158 mg/dL — AB (ref 70–99)
GLUCOSE-CAPILLARY: 222 mg/dL — AB (ref 70–99)
Glucose-Capillary: 229 mg/dL — ABNORMAL HIGH (ref 70–99)
Glucose-Capillary: 162 mg/dL — ABNORMAL HIGH (ref 70–99)

## 2015-03-20 LAB — CULTURE, BLOOD (ROUTINE X 2)
Culture: NO GROWTH
Culture: NO GROWTH

## 2015-03-20 LAB — PROTIME-INR
INR: 2.24 — AB (ref 0.00–1.49)
Prothrombin Time: 25 seconds — ABNORMAL HIGH (ref 11.6–15.2)

## 2015-03-20 MED ORDER — GUAIFENESIN ER 600 MG PO TB12
600.0000 mg | ORAL_TABLET | Freq: Two times a day (BID) | ORAL | Status: DC
  Administered 2015-03-20 – 2015-04-01 (×24): 600 mg via ORAL
  Filled 2015-03-20 (×27): qty 1

## 2015-03-20 NOTE — Progress Notes (Signed)
Speech Language Pathology Daily Session Note  Patient Details  Name: Frank Mclean MRN: [id number] Date of Birth: 25-May-1941  Today's Date: 03/20/2015 SLP Individual Time: 1400-1430 SLP Individual Time Calculation (min): 30 min  Short Term Goals: Week 1: SLP Short Term Goal 1 (Week 1): short term goals = long term goals  Skilled Therapeutic Interventions: Skilled treatment session focused on addressing dysphagia goals. SLP facilitated session by providing Dys.3 textures and thin liquids with skilled observation and close supervision.  Patient self-feed consistencies with timely oral clearance and no overt s/s of aspiration.  Recommend trial tray of Dys.3 textures and thin liquids next visit.     FIM:  Comprehension Comprehension Mode: Auditory Comprehension: 6-Follows complex conversation/direction: With extra time/assistive device Expression Expression Mode: Verbal Expression: 6-Expresses complex ideas: With extra time/assistive device Social Interaction Social Interaction: 5-Interacts appropriately 90% of the time - Needs monitoring or encouragement for participation or interaction. Problem Solving Problem Solving: 5-Solves basic problems: With no assist Memory Memory: 6-More than reasonable amt of time FIM - Eating Eating Activity: 6: Modified consistency diet: (comment);5: Supervision/cues (Dys.2 thin )  Pain Pain Assessment Pain Assessment: No/denies pain  Therapy/Group: Individual Therapy  Charlane Ferretti., CCC-SLP [phone]  Frank Mclean 03/20/2015, 2:43 PM

## 2015-03-20 NOTE — Progress Notes (Signed)
Occupational Therapy Session Note  Patient Details  Name: Frank Mclean MRN: [id number] Date of Birth: March 22, 1941  Today's Date: 03/20/2015 OT Individual Time: 1430-1500 OT Individual Time Calculation (min): 30 min    Short Term Goals: Week 1:  OT Short Term Goal 1 (Week 1): STG=LTG due to short term stay  Skilled Therapeutic Interventions/Progress Updates:    Pt seen for OT session focusing on functional activity tolerance and functional dynamic standing balance. Pt in w/c upon arrival having just finished with speech therapist. Pt self-propelled w/c ~ 50 yards with supervision before fatiguing requiring assist. Pt taken to therapy gym where he stood from w/c to complete table top task in standing. Pt required min A for sit >stand from w/c and min-mod steadying assist when standing at the table. Pt required to reach to L side to obtain clothes pin and place on clothes pin tree placed on R. Pt required steadying assist and cues for upright posture and foot placement to increase base of support. Pt required increased amount of steadying assist when fatigued as he has posterior lean when fatiguing. Pt tolerated 2 standing trials, the first one ~3 minutes in duration and second standing trial ~1 minute in duration. Pt required min-mod assist to sit in w/c due to decrease decentrec control with VCs for technique/ hand placement. At end of session, pt self propelled w/c through unit requiring 1 rest break with supervision/ VCs for w/c management/ safety. Once in room, pt ambulated with RW and CGA to recliner where he required assist for controlled descent. Pt left in recliner at end of session, all needs in reach.   Therapy Documentation Precautions:  Precautions Precautions: Fall, ICD/Pacemaker Precaution Comments: will need sx again to complete pacemaker placement Required Braces or Orthoses: Sling Restrictions Weight Bearing Restrictions: No Other Position/Activity Restrictions: L arm in  sling Pain: Pain Assessment Pain Assessment: No/denies pain  See FIM for current functional status  Therapy/Group: Individual Therapy  [name], Brean Carberry C 03/20/2015, 3:20 PM

## 2015-03-20 NOTE — Progress Notes (Signed)
ANTICOAGULATION CONSULT NOTE - Follow Up Consult  Pharmacy Consult for coumadin Indication: atrial fibrillation  Allergies  Allergen Reactions  . Levothyroxine Sodium Other (See Comments)    MUST TAKE BRAND NAME GENERIC DOESN'T WORK FOR PATIENT  . Chicken Protein     Does not like chicken or [place]  . Eggs Or Egg-Derived Products     Does not like eggs    Patient Measurements: Weight: 225 lb 1.4 oz (102.1 kg) Heparin Dosing Weight:   Vital Signs: Temp: 98 F (36.7 C) (03/31 0534) Temp Source: Oral (03/31 0534) BP: 133/78 mmHg (03/31 0534) Pulse Rate: 61 (03/31 0534)  Labs:  Recent Labs  03/18/15 0548 03/19/15 0424 03/20/15 0720  HGB 8.8*  --   --   HCT 28.5*  --   --   PLT 238  --   --   LABPROT 27.7* 26.8* 25.0*  INR 2.56* 2.45* 2.24*  CREATININE 2.43*  --   --     Estimated Creatinine Clearance: 33.5 mL/min (by C-G formula based on Cr of 2.43).   Medications:  Scheduled:  . amLODipine  10 mg Oral Daily  . amoxicillin-clavulanate  1 tablet Oral 3 times per day  . atorvastatin  20 mg Oral Daily  . budesonide-formoterol  2 puff Inhalation BID  . carvedilol  12.5 mg Oral BID WC  . cholecalciferol  1,000 Units Oral Daily  . escitalopram  5 mg Oral QHS  . feeding supplement (GLUCERNA SHAKE)  237 mL Oral TID BM  . ferrous sulfate  325 mg Oral TID WC  . hydrALAZINE  100 mg Oral TID  . insulin aspart  0-9 Units Subcutaneous TID WC  . isosorbide mononitrate  90 mg Oral BID  . levothyroxine  200 mcg Oral QAC breakfast  . methylPREDNISolone  4 mg Oral 4X daily taper  . multivitamin with minerals  1 tablet Oral Daily  . pantoprazole  40 mg Oral BID  . polyethylene glycol  17 g Oral Daily  . torsemide  10 mg Oral Daily  . cyanocobalamin  1,000 mcg Oral Daily  . [START ON 03/21/2015] warfarin  2.5 mg Oral Once per day on Mon Fri  . warfarin  5 mg Oral Once per day on Sun Tue Wed Thu Sat  . Warfarin - Pharmacist Dosing Inpatient   Does not apply q1800    Infusions:    Assessment: 74 yo male with afib is currently on therapeutic coumadin.  INR today is 2.24.  Goal of Therapy:  INR 2-3 Monitor platelets by anticoagulation protocol: Yes   Plan:  Coumadin to Coumadin 5mg  daily except 2.5mg  Mon/Fri. Daily INR  Sheleen Conchas, Tsz-Yin 03/20/2015,8:51 AM

## 2015-03-20 NOTE — Progress Notes (Signed)
Physical Therapy Session Note  Patient Details  Name: Frank Mclean MRN: [id number] Date of Birth: February 03, 1941  Today's Date: 03/20/2015 PT Individual Time: 1300-1400 PT Individual Time Calculation (min): 60 min   Short Term Goals: Week 1:  PT Short Term Goal 1 (Week 1): Pt will be able to transfer with min A from various surface heights PT Short Term Goal 2 (Week 1): Pt will be able to gait x 150' with min A PT Short Term Goal 3 (Week 1): Pt will be able to do 5 stairs with 1 rail with mod A PT Short Term Goal 4 (Week 1): Pt will demonstrate dynamic standing balance during functional task with min A  Skilled Therapeutic Interventions/Progress Updates:   w/c propulsion down to the therapy gym with overall S for functional strengthening and endurance. Pt with need to urinate, returned to room and pt able to use urinal with set-up assist. Sharlene Motts Balance test administered (see below for results) and discussed fall risk with patient who verbalized understanding. Patient demonstrates increased fall risk as noted by score of 12/56 on Berg Balance Scale.  (<36= high risk for falls, close to 100%; 37-45 significant >80%; 46-51 moderate >50%; 52-55 lower >25%). Dynamic gait through obstacle course to simulate home environment with min A using RW > 100' with cues for posture and maintaining body inside of RW during turns. LE therex for functional strengthening to aid with overall mobility including LAQ, marches, and standing heel/toe raises using 2# ankle weights on BLE.   Therapy Documentation Precautions:  Precautions Precautions: Fall, ICD/Pacemaker Precaution Comments: will need sx again to complete pacemaker placement Restrictions Weight Bearing Restrictions: No Pain: Pain Assessment Pain Assessment: No/denies pain    Balance: Standardized Balance Assessment Standardized Balance Assessment: Berg Balance Test Berg Balance Test Sit to Stand: Needs moderate or maximal assist to stand Standing  Unsupported: Needs several tries to stand 30 seconds unsupported Sitting with Back Unsupported but Feet Supported on Floor or Stool: Able to sit safely and securely 2 minutes Stand to Sit: Uses backs of legs against chair to control descent Transfers: Needs one person to assist Standing Unsupported with Eyes Closed: Able to stand 3 seconds Standing Ubsupported with Feet Together: Needs help to attain position and unable to hold for 15 seconds From Standing, Reach Forward with Outstretched Arm: Loses balance while trying/requires external support From Standing Position, Pick up Object from Floor: Unable to try/needs assist to keep balance (able to pick up with min A) From Standing Position, Turn to Look Behind Over each Shoulder: Needs supervision when turning (minimal head turn) Turn 360 Degrees: Needs assistance while turning (min A) Standing Unsupported, Alternately Place Feet on Step/Stool: Able to complete >2 steps/needs minimal assist Standing Unsupported, One Foot in Front: Loses balance while stepping or standing Standing on One Leg: Unable to try or needs assist to prevent fall Total Score: 12  See FIM for current functional status  Therapy/Group: Individual Therapy  Karolee Stamps Darrol Poke, PT, DPT  03/20/2015, 3:38 PM

## 2015-03-20 NOTE — Progress Notes (Signed)
[place] PHYSICAL MEDICINE & REHABILITATION     PROGRESS NOTE    Subjective/Complaints: Having occasional cough with phlegm. Denies pain. Still feels weak RLE. Therapy went ok yesterday   Objective: Vital Signs: Blood pressure 133/78, pulse 61, temperature 98 F (36.7 C), temperature source Oral, resp. rate 18, weight 102.1 kg (225 lb 1.4 oz), SpO2 96 %. No results found.  Recent Labs  03/18/15 0548  WBC 13.0*  HGB 8.8*  HCT 28.5*  PLT 238    Recent Labs  03/18/15 0548  NA 139  K 3.9  CL 105  GLUCOSE 194*  BUN 48*  CREATININE 2.43*  CALCIUM 8.7   CBG (last 3)   Recent Labs  03/19/15 1630 03/19/15 2107 03/20/15 0650  GLUCAP 159* 185* 229*    Wt Readings from Last 3 Encounters:  03/19/15 102.1 kg (225 lb 1.4 oz)  03/14/15 95.5 kg (210 lb 8.6 oz)  03/13/15 92.08 kg (203 lb)    Physical Exam:  Constitutional: He is oriented to person, place, and time. He appears well-developed.  HENT: PERRL. Dentition fair Head: Normocephalic.  Eyes: EOM are normal.  Neck: Normal range of motion. Neck supple. No thyromegaly present.  Cardiovascular:  Cardiac rate controlled without murmur Respiratory:  Lungs decreased breath sounds at the bases but clear to auscultation otherwise GI: Soft. Bowel sounds are normal. He exhibits no distension.  Neurological: He is alert and oriented to person, place, and time.  Follow simple commands. Moves all 4's.3-4/5 UE's and 2+ hf, 3 ke and 4/5 ankles  Skin: Skin is warm and dry.  Psychiatric: He has a normal mood and affect. His behavior is normal. Thought content normal   Assessment/Plan: 1. Functional deficits secondary to debility and gait disorder which require 3+ hours per day of interdisciplinary therapy in a comprehensive inpatient rehab setting. Physiatrist is providing close team supervision and 24 hour management of active medical problems listed below. Physiatrist and rehab team continue to assess barriers to  discharge/monitor patient progress toward functional and medical goals. FIM: FIM - Bathing Bathing Steps Patient Completed: Chest, Right Arm, Left Arm, Abdomen, Front perineal area, Right upper leg, Left upper leg, Buttocks, Right lower leg (including foot), Left lower leg (including foot) Bathing: 5: Supervision: Safety issues/verbal cues  FIM - Upper Body Dressing/Undressing Upper body dressing/undressing steps patient completed: Thread/unthread right sleeve of pullover shirt/dresss, Thread/unthread left sleeve of pullover shirt/dress, Put head through opening of pull over shirt/dress, Pull shirt over trunk Upper body dressing/undressing: 5: Set-up assist to: Obtain clothing/put away FIM - Lower Body Dressing/Undressing Lower body dressing/undressing steps patient completed: Thread/unthread right pants leg, Thread/unthread left pants leg, Pull pants up/down, Don/Doff right sock, Don/Doff left sock Lower body dressing/undressing: 4: Steadying Assist  FIM - Toileting Toileting steps completed by patient: Performs perineal hygiene Toileting Assistive Devices: Grab bar or rail for support Toileting: 2: Max-Patient completed 1 of 3 steps  FIM - Diplomatic Services operational officer Devices: Environmental consultant, Therapist, music Transfers: 3-To toilet/BSC: Mod A (lift or lower assist), 3-From toilet/BSC: Mod A (lift or lower assist)  FIM - Banker Devices: Walker, Arm rests Bed/Chair Transfer: 5: Supine > Sit: Supervision (verbal cues/safety issues), 3: Bed > Chair or W/C: Mod A (lift or lower assist)  FIM - Locomotion: Wheelchair Locomotion: Wheelchair: 5: Travels 150 ft or more: maneuvers on rugs and over door sills with supervision, cueing or coaxing FIM - Locomotion: Ambulation Locomotion: Ambulation Assistive Devices: Designer, industrial/product Ambulation/Gait Assistance:  4: Min assist Locomotion: Ambulation: 2: Travels 50 - 149 ft with minimal assistance  (Pt.>75%)  Comprehension Comprehension Mode: Auditory Comprehension: 6-Follows complex conversation/direction: With extra time/assistive device  Expression Expression Mode: Verbal Expression: 6-Expresses complex ideas: With extra time/assistive device  Social Interaction Social Interaction: 5-Interacts appropriately 90% of the time - Needs monitoring or encouragement for participation or interaction.  Problem Solving Problem Solving: 5-Solves basic problems: With no assist  Memory Memory: 6-More than reasonable amt of time  Medical Problem List and Plan: 1. Functional deficits secondary to Debility /HCAP/Gait disorder 2. DVT Prophylaxis/Anticoagulation: Chronic coumadin/A fib. 3. Pain Management: Hydrocodone as needed. 4. Mood.Lexapro 5 mg daily 5. Neuropsych: This patient is capable of making decisions on her own behalf. 6. Skin/Wound Care: Routine skin checks 7. Fluids/Electrolytes/Nutrition: strict I&O . 8.ID/HCAP. Augmentin 500 mg 3 times a day until 03/23/2015.  -wbc's seem to be trending down  -recheck cbc Friday  -IS, FV, OOB 9.LBBB./non ischemic cardiomyopathy.Follow up cardiology on defibrillator in reference to being unable to place LV lead due to no appropriate veins and plan follow-up 10.CRI.Baseline Creatinine 2.28-2.75 at baseline 11. Hypertension. Norvasc 10 mg daily, Coreg 12.5 mg twice a day, hydralazine 100 mg 3 times a day, Imdur 90 mg twice a day, Demadex 10 mg daily. Monitor with increased mobility 12. GERD versus esophageal dysmotility Dysphagia 2 thin liquid diet. Advance diet as tolerated 13. Hypothyroidism. Synthroid 14. Hyperlipidemia. Lipitor 15. Acute on chronic anemia. Continue iron supplement. Follow-up hgb 8.8 16. COPD. Symbicort, steroid taper, mucinex bid 17. Diabetes: SSI while steroids taper   LOS (Days) 3 A FACE TO FACE EVALUATION WAS PERFORMED  Frank Mclean 03/20/2015 8:57 AM

## 2015-03-20 NOTE — Progress Notes (Signed)
Occupational Therapy Session Note  Patient Details  Name: Frank Mclean MRN: [id number] Date of Birth: 06/23/1941  Today's Date: 03/20/2015 OT Individual Time: 1000-1100 OT Individual Time Calculation (min): 60 min    Short Term Goals: Week 1:  OT Short Term Goal 1 (Week 1): STG=LTG due to short term stay  Skilled Therapeutic Interventions/Progress Updates:    Pt seen for ADL bathing and dressing task. Pt transferring supine> EOB upon arrival. He was educated regarding need for assist/ supervision with mobility. Pt completed bathing, dressing, and grooming task seated EOB. He demonstrated functional sitting balance able to reach to B sides and across midline without LOB. He stood with steadying assist at [facility] for buttock hygiene and to pull pants up. Pt ambulated to w/c with RW and supervision, requiring min assist for sit <> stand transfers.  Pt taken to ADL apartment where he transferred onto low surface couch with supervision. He required mod A to stand from low soft surface. He then completed bed mobility on a standard bed with supervision. Pt returned to room in w/c at end of session. Pt left in w/c at end of session, all needs in reach.    Pt educated regarding need for assist, modified transfer techniques, and d/c planning.   Therapy Documentation Precautions:  Precautions Precautions: Fall, ICD/Pacemaker Precaution Comments: will need sx again to complete pacemaker placement Required Braces or Orthoses: Sling Restrictions Weight Bearing Restrictions: No Other Position/Activity Restrictions: L arm in sling Pain: Pain Assessment Pain Assessment: No/denies pain  See FIM for current functional status  Therapy/Group: Individual Therapy  [name], Adriane Guglielmo C 03/20/2015, 12:04 PM

## 2015-03-21 ENCOUNTER — Inpatient Hospital Stay (HOSPITAL_COMMUNITY): Payer: Medicare Other | Admitting: Occupational Therapy

## 2015-03-21 ENCOUNTER — Inpatient Hospital Stay (HOSPITAL_COMMUNITY): Payer: Medicare Other

## 2015-03-21 ENCOUNTER — Ambulatory Visit (HOSPITAL_COMMUNITY): Payer: Medicare Other | Admitting: Speech Pathology

## 2015-03-21 LAB — GLUCOSE, CAPILLARY
GLUCOSE-CAPILLARY: 125 mg/dL — AB (ref 70–99)
GLUCOSE-CAPILLARY: 154 mg/dL — AB (ref 70–99)
Glucose-Capillary: 153 mg/dL — ABNORMAL HIGH (ref 70–99)
Glucose-Capillary: 183 mg/dL — ABNORMAL HIGH (ref 70–99)

## 2015-03-21 LAB — PROTIME-INR
INR: 2.5 — ABNORMAL HIGH (ref 0.00–1.49)
Prothrombin Time: 27.2 seconds — ABNORMAL HIGH (ref 11.6–15.2)

## 2015-03-21 LAB — CBC
HEMATOCRIT: 30.5 % — AB (ref 39.0–52.0)
HEMOGLOBIN: 9.3 g/dL — AB (ref 13.0–17.0)
MCH: 25.3 pg — AB (ref 26.0–34.0)
MCHC: 30.5 g/dL (ref 30.0–36.0)
MCV: 83.1 fL (ref 78.0–100.0)
Platelets: 313 10*3/uL (ref 150–400)
RBC: 3.67 MIL/uL — ABNORMAL LOW (ref 4.22–5.81)
RDW: 15.6 % — AB (ref 11.5–15.5)
WBC: 12.5 10*3/uL — ABNORMAL HIGH (ref 4.0–10.5)

## 2015-03-21 MED ORDER — GLIPIZIDE 2.5 MG HALF TABLET
2.5000 mg | ORAL_TABLET | Freq: Two times a day (BID) | ORAL | Status: DC
  Administered 2015-03-21 – 2015-04-01 (×22): 2.5 mg via ORAL
  Filled 2015-03-21 (×26): qty 1

## 2015-03-21 NOTE — Progress Notes (Signed)
Speech Language Pathology Daily Session Note  Patient Details  Name: Frank Mclean MRN: [id number] Date of Birth: 09-09-1941  Today's Date: 03/21/2015 SLP Individual Time: 0830-0930 SLP Individual Time Calculation (min): 60 min  Short Term Goals: Week 1: SLP Short Term Goal 1 (Week 1): short term goals = long term goals  Skilled Therapeutic Interventions: Skilled treatment session focused on addressing dysphagia goals and dysphagia education. SLP facilitated session by providing Min physical assist while dressing and grooming to prepare for breakfast.  Patient set-up breakfast tray with supervision level verbal cues for self-monitoring and correcting of errors due to him distracting himself with conversation.  Patient consumed Dys.3 textures and thin liquids with skilled observation and close supervision; patient demonstrated timely oral clearance and no overt s/s of aspiration.  Recommend diet upgrade and to Dys.3 textures and thin liquids.  SLP will continue to follow for tolerance an advancement to regular textures.       FIM:  Comprehension Comprehension Mode: Auditory Comprehension: 6-Follows complex conversation/direction: With extra time/assistive device Expression Expression Mode: Verbal Expression: 6-Expresses complex ideas: With extra time/assistive device Social Interaction Social Interaction: 5-Interacts appropriately 90% of the time - Needs monitoring or encouragement for participation or interaction. Problem Solving Problem Solving: 5-Solves basic 90% of the time/requires cueing < 10% of the time Memory Memory: 5-Recognizes or recalls 90% of the time/requires cueing < 10% of the time FIM - Eating Eating Activity: 6: Modified consistency diet: (comment);5: Supervision/cues (Dys.3 thin )  Pain Pain Assessment Pain Assessment: No/denies pain  Therapy/Group: Individual Therapy  Charlane Ferretti., CCC-SLP [phone]  Ailsa Mireles 03/21/2015, 9:47 AM

## 2015-03-21 NOTE — Progress Notes (Signed)
ANTICOAGULATION CONSULT NOTE - Follow Up Consult  Pharmacy Consult for coumadin Indication: atrial fibrillation  Allergies  Allergen Reactions  . Levothyroxine Sodium Other (See Comments)    MUST TAKE BRAND NAME GENERIC DOESN'T WORK FOR PATIENT  . Chicken Protein     Does not like chicken or [place]  . Eggs Or Egg-Derived Products     Does not like eggs    Patient Measurements: Weight: 225 lb 1.4 oz (102.1 kg) Heparin Dosing Weight:   Vital Signs: Temp: 98.4 F (36.9 C) (04/01 0523) Temp Source: Oral (04/01 0523) BP: 141/74 mmHg (04/01 0823) Pulse Rate: 59 (04/01 0523)  Labs:  Recent Labs  03/19/15 0424 03/20/15 0720 03/21/15 0635  HGB  --   --  9.3*  HCT  --   --  30.5*  PLT  --   --  313  LABPROT 26.8* 25.0* 27.2*  INR 2.45* 2.24* 2.50*    Estimated Creatinine Clearance: 33.5 mL/min (by C-G formula based on Cr of 2.43).   Medications:  Scheduled:  . amLODipine  10 mg Oral Daily  . amoxicillin-clavulanate  1 tablet Oral 3 times per day  . atorvastatin  20 mg Oral Daily  . budesonide-formoterol  2 puff Inhalation BID  . carvedilol  12.5 mg Oral BID WC  . cholecalciferol  1,000 Units Oral Daily  . escitalopram  5 mg Oral QHS  . feeding supplement (GLUCERNA SHAKE)  237 mL Oral TID BM  . ferrous sulfate  325 mg Oral TID WC  . guaiFENesin  600 mg Oral BID  . hydrALAZINE  100 mg Oral TID  . insulin aspart  0-9 Units Subcutaneous TID WC  . isosorbide mononitrate  90 mg Oral BID  . levothyroxine  200 mcg Oral QAC breakfast  . multivitamin with minerals  1 tablet Oral Daily  . pantoprazole  40 mg Oral BID  . polyethylene glycol  17 g Oral Daily  . torsemide  10 mg Oral Daily  . cyanocobalamin  1,000 mcg Oral Daily  . warfarin  2.5 mg Oral Once per day on Mon Fri  . warfarin  5 mg Oral Once per day on Sun Tue Wed Thu Sat  . Warfarin - Pharmacist Dosing Inpatient   Does not apply q1800   Infusions:    Assessment: 74 yo male with afib is currently on  therapeutic coumadin.  INR today is 2.5.  Goal of Therapy:  INR 2-3 Monitor platelets by anticoagulation protocol: Yes   Plan:  Cont Coumadin to Coumadin 5mg  daily except 2.5mg  Mon/Fri. Daily INR. If INR good tom, can change to TThS  [place]ora Athanas, Tsz-Yin 03/21/2015,8:41 AM

## 2015-03-21 NOTE — Plan of Care (Signed)
Problem: RH PAIN MANAGEMENT Goal: RH STG PAIN MANAGED AT OR BELOW PT'S PAIN GOAL Outcome: Adequate for Discharge Patient denies pain when queried.

## 2015-03-21 NOTE — Progress Notes (Signed)
Occupational Therapy Session Note  Patient Details  Name: Frank Mclean MRN: [id number] Date of Birth: [birth year]  Today's Date: 03/21/2015 OT Individual Time: 1100-1200 OT Individual Time Calculation (min): 60 min    Short Term Goals: Week 1:  OT Short Term Goal 1 (Week 1): STG=LTG due to short term stay  Skilled Therapeutic Interventions/Progress Updates:    Pt seen for OT session focusing on cognitive every day tasks, home mobility, and self-care. Pt in w/c upon arrival agreeable to therapy. Pt taken to day room for newspaper activity. Pt requested to find various items in newspaper amongst highly stimulating background. Pt able to identify items with increased time, however, demonstrated ability to remember what items to look for despite distractions. Pt then wrote out shopping list in prep for cooking task next week. Pt required 1 VC to complete task. Pt completed sit> stand from low surface with min A and cues for proper technique. Pt then taken to OT apartment. He ambulated throughout kitchen with RW and supervision. Standing at counter, pt with incontinent episode requesting return to room. In room, pt transferred w/c> RW with min A and ambulated to toilet. He completed clothing management and buttock hygiene in standing with steadying assist. Following toileting task pt ambulated to sink for hand washing task. He required max VCs and tactile cues for management of RW. Pt fatiguing during standing requiring seated rest break quickly as pt with strong posterior lean once fatigued. Pt returned to w/c and left sitting in w/c with all needs in reach and NT present.    Pt with decreased functional endurance, requiring seated rest breaks throughout when completing standing tasks. He required increased number of cognitive cues during functional tasks today compared to previous sessions. Pt educated regarding need for assist, safety within the home, energy conservation, and d/c planning.   Therapy  Documentation Precautions:  Precautions Precautions: Fall, ICD/Pacemaker Precaution Comments: will need sx again to complete pacemaker placement Required Braces or Orthoses: Sling Restrictions Weight Bearing Restrictions: No Other Position/Activity Restrictions: L arm in sling Pain:  No/ Denies pain  See FIM for current functional status  Therapy/Group: Individual Therapy  [name], Rondell Frick C 03/21/2015, 3:38 PM

## 2015-03-21 NOTE — Plan of Care (Signed)
Problem: RH SKIN INTEGRITY Goal: RH STG SKIN FREE OF INFECTION/BREAKDOWN Skin free of infection/breakdown with min assistance Outcome: Adequate for Discharge No skin injury/breakdown noted

## 2015-03-21 NOTE — Progress Notes (Signed)
[place] PHYSICAL MEDICINE & REHABILITATION     PROGRESS NOTE    Subjective/Complaints: Had a pretty good night. Denies coughing. Pain controlled. Feels that he's getting stronger in therapy.   Objective: Vital Signs: Blood pressure 141/74, pulse 59, temperature 98.4 F (36.9 C), temperature source Oral, resp. rate 18, weight 102.1 kg (225 lb 1.4 oz), SpO2 96 %. No results found.  Recent Labs  03/21/15 0635  WBC 12.5*  HGB 9.3*  HCT 30.5*  PLT 313   No results for input(s): NA, K, CL, GLUCOSE, BUN, CREATININE, CALCIUM in the last 72 hours.  Invalid input(s): CO CBG (last 3)   Recent Labs  03/20/15 1620 03/20/15 2111 03/21/15 0654  GLUCAP 158* 222* 125*    Wt Readings from Last 3 Encounters:  03/19/15 102.1 kg (225 lb 1.4 oz)  03/14/15 95.5 kg (210 lb 8.6 oz)  03/13/15 92.08 kg (203 lb)    Physical Exam:  Constitutional: He is oriented to person, place, and time. He appears well-developed.  HENT: PERRL. Dentition fair Head: Normocephalic.  Eyes: EOM are normal.  Neck: Normal range of motion. Neck supple. No thyromegaly present.  Cardiovascular:  Cardiac rate controlled without murmur Respiratory:  Lungs decreased breath sounds at the bases   GI: Soft. Bowel sounds are normal. He exhibits no distension.  Neurological: He is alert and oriented to person, place, and time.  Follow simple commands. Moves all 4's.3-4/5 UE's and 2+ hf, 3 ke and 4/5 ankles  Skin: Skin is warm and dry.  Psychiatric: He has a normal mood and affect. His behavior is normal. Thought content normal   Assessment/Plan: 1. Functional deficits secondary to debility and gait disorder which require 3+ hours per day of interdisciplinary therapy in a comprehensive inpatient rehab setting. Physiatrist is providing close team supervision and 24 hour management of active medical problems listed below. Physiatrist and rehab team continue to assess barriers to discharge/monitor patient  progress toward functional and medical goals. FIM: FIM - Bathing Bathing Steps Patient Completed: Chest, Right Arm, Left Arm, Abdomen, Front perineal area, Right upper leg, Left upper leg, Buttocks, Right lower leg (including foot), Left lower leg (including foot) Bathing: 5: Supervision: Safety issues/verbal cues  FIM - Upper Body Dressing/Undressing Upper body dressing/undressing steps patient completed: Thread/unthread right sleeve of pullover shirt/dresss, Thread/unthread left sleeve of pullover shirt/dress, Put head through opening of pull over shirt/dress, Pull shirt over trunk Upper body dressing/undressing: 5: Set-up assist to: Obtain clothing/put away FIM - Lower Body Dressing/Undressing Lower body dressing/undressing steps patient completed: Thread/unthread right pants leg, Thread/unthread left pants leg, Pull pants up/down, Don/Doff right sock, Don/Doff left sock, Thread/unthread right underwear leg, Thread/unthread left underwear leg, Pull underwear up/down, Fasten/unfasten pants Lower body dressing/undressing: 4: Steadying Assist  FIM - Toileting Toileting steps completed by patient: Performs perineal hygiene Toileting Assistive Devices: Grab bar or rail for support Toileting: 2: Max-Patient completed 1 of 3 steps  FIM - Diplomatic Services operational officer Devices: Environmental consultant, Therapist, music Transfers: 3-To toilet/BSC: Mod A (lift or lower assist), 3-From toilet/BSC: Mod A (lift or lower assist)  FIM - Banker Devices: Walker, Arm rests Bed/Chair Transfer: 4: Chair or W/C > Bed: Min A (steadying Pt. > 75%), 4: Bed > Chair or W/C: Min A (steadying Pt. > 75%)  FIM - Locomotion: Wheelchair Locomotion: Wheelchair: 5: Travels 150 ft or more: maneuvers on rugs and over door sills with supervision, cueing or coaxing FIM - Locomotion: Ambulation Locomotion: Health visitor  Devices: Designer, industrial/product Ambulation/Gait Assistance: 4:  Min assist Locomotion: Ambulation: 2: Travels 50 - 149 ft with minimal assistance (Pt.>75%)  Comprehension Comprehension Mode: Auditory Comprehension: 6-Follows complex conversation/direction: With extra time/assistive device  Expression Expression Mode: Verbal Expression: 6-Expresses complex ideas: With extra time/assistive device  Social Interaction Social Interaction: 5-Interacts appropriately 90% of the time - Needs monitoring or encouragement for participation or interaction.  Problem Solving Problem Solving: 5-Solves basic problems: With no assist  Memory Memory: 6-More than reasonable amt of time  Medical Problem List and Plan: 1. Functional deficits secondary to Debility /HCAP/Gait disorder 2. DVT Prophylaxis/Anticoagulation: Chronic coumadin/A fib. 3. Pain Management: Hydrocodone as needed. 4. Mood.Lexapro 5 mg daily 5. Neuropsych: This patient is capable of making decisions on her own behalf. 6. Skin/Wound Care: Routine skin checks 7. Fluids/Electrolytes/Nutrition: strict I&O . 8.ID/HCAP. Augmentin 500 mg 3 times a day until 03/23/2015.  -wbc's 12.5 today (stable to decreased)  -IS, FV, OOB 9.LBBB./non ischemic cardiomyopathy.Follow up cardiology on defibrillator in reference to being unable to place LV lead due to no appropriate veins and plan follow-up 10.CRI.Baseline Creatinine 2.28-2.75 at baseline 11. Hypertension. Norvasc 10 mg daily, Coreg 12.5 mg twice a day, hydralazine 100 mg 3 times a day, Imdur 90 mg twice a day, Demadex 10 mg daily. Monitor with increased mobility 12. GERD versus esophageal dysmotility Dysphagia 2 thin liquid diet. Advance diet as tolerated 13. Hypothyroidism. Synthroid 14. Hyperlipidemia. Lipitor 15. Acute on chronic anemia. Continue iron supplement. Follow-up hgb 8.8 16. COPD. Symbicort, steroid taper, mucinex bid 17. Diabetes: resume glipizide 2.5mg  bid, SSI   LOS (Days) 4 A FACE TO FACE EVALUATION WAS PERFORMED  SWARTZ,ZACHARY  T 03/21/2015 9:27 AM

## 2015-03-21 NOTE — Progress Notes (Signed)
Physical Therapy Session Note  Patient Details  Name: Frank Mclean MRN: [id number] Date of Birth: Jul 13, 1941  Today's Date: 03/21/2015 PT Individual Time: 1450-1550 PT Individual Time Calculation (min): 60 min   Short Term Goals: Week 1:  PT Short Term Goal 1 (Week 1): Pt will be able to transfer with min A from various surface heights PT Short Term Goal 2 (Week 1): Pt will be able to gait x 150' with min A PT Short Term Goal 3 (Week 1): Pt will be able to do 5 stairs with 1 rail with mod A PT Short Term Goal 4 (Week 1): Pt will demonstrate dynamic standing balance during functional task with min A  Skilled Therapeutic Interventions/Progress Updates:   Session focused on functional standing balance on compliant surface while performing pipe tree task using 1 or no UE support (pt required mod verbal cues and extra time for completing puzzle) and min A for balance, functional sit to stands with RW with cues for technique with min A (1 episode requiring mod A due to LOB posterior), gait training with RW with steady A with cues for upright posture and safety with RW during turn, and able to perform gait back to room with RW after seated rest break due to fatigue. Returned back to bed end of session with RW with min A and min A for standing balance to doff pants and S seated EOB to doff socks. Pt with slower processing noted during this session, especially with pipe tree task. Will continue to monitor cognition during future sessions and discuss with SLP as appropriate.   Therapy Documentation Precautions:  Precautions Precautions: Fall, ICD/Pacemaker Precaution Comments: will need sx again to complete pacemaker placement Restrictions Weight Bearing Restrictions: No  Pain:  Denies pain.  See FIM for current functional status  Therapy/Group: Individual Therapy  Karolee Stamps Darrol Poke, PT, DPT    03/21/2015, 4:23 PM

## 2015-03-22 ENCOUNTER — Inpatient Hospital Stay (HOSPITAL_COMMUNITY): Payer: Medicare Other | Admitting: Occupational Therapy

## 2015-03-22 ENCOUNTER — Inpatient Hospital Stay (HOSPITAL_COMMUNITY): Payer: Medicare Other | Admitting: *Deleted

## 2015-03-22 DIAGNOSIS — E1122 Type 2 diabetes mellitus with diabetic chronic kidney disease: Secondary | ICD-10-CM

## 2015-03-22 DIAGNOSIS — N189 Chronic kidney disease, unspecified: Secondary | ICD-10-CM

## 2015-03-22 LAB — GLUCOSE, CAPILLARY
GLUCOSE-CAPILLARY: 156 mg/dL — AB (ref 70–99)
GLUCOSE-CAPILLARY: 123 mg/dL — AB (ref 70–99)
GLUCOSE-CAPILLARY: 115 mg/dL — AB (ref 70–99)
Glucose-Capillary: 114 mg/dL — ABNORMAL HIGH (ref 70–99)

## 2015-03-22 LAB — PROTIME-INR
INR: 2.28 — ABNORMAL HIGH (ref 0.00–1.49)
Prothrombin Time: 25.3 seconds — ABNORMAL HIGH (ref 11.6–15.2)

## 2015-03-22 MED ORDER — NYSTATIN 100000 UNIT/ML MT SUSP
5.0000 mL | Freq: Four times a day (QID) | OROMUCOSAL | Status: DC
  Administered 2015-03-22 – 2015-04-01 (×40): 500000 [IU] via ORAL
  Filled 2015-03-22 (×45): qty 5

## 2015-03-22 NOTE — Progress Notes (Signed)
ANTICOAGULATION CONSULT NOTE - Follow Up Consult  Pharmacy Consult for coumadin Indication: atrial fibrillation  Allergies  Allergen Reactions  . Levothyroxine Sodium Other (See Comments)    MUST TAKE BRAND NAME GENERIC DOESN'T WORK FOR PATIENT  . Chicken Protein     Does not like chicken or [place]  . Eggs Or Egg-Derived Products     Does not like eggs    Patient Measurements: Weight: 225 lb 1.4 oz (102.1 kg)  Vital Signs: Temp: 98 F (36.7 C) (04/02 0604) Temp Source: Oral (04/02 0604) BP: 116/64 mmHg (04/02 0604) Pulse Rate: 62 (04/02 0604)  Labs:  Recent Labs  03/20/15 0720 03/21/15 0635 03/22/15 0655  HGB  --  9.3*  --   HCT  --  30.5*  --   PLT  --  313  --   LABPROT 25.0* 27.2* 25.3*  INR 2.24* 2.50* 2.28*    Estimated Creatinine Clearance: 33.5 mL/min (by C-G formula based on Cr of 2.43).  Assessment: 74 yo male with afib is currently on therapeutic coumadin.  No bleeding noted. Hgb low but stable, plt ok.  Goal of Therapy:  INR 2-3 Monitor platelets by anticoagulation protocol: Yes   Plan:  Coumadin 5mg  daily except 2.5mg  Mon/Fri. INR to M/W/F  Christoper Fabian, PharmD, BCPS Clinical pharmacist, pager [phone] 03/22/2015,11:55 AM

## 2015-03-22 NOTE — Progress Notes (Signed)
Patient ID: PAXTON CARLONE, male   DOB: [birth year], 74 y.o.   MRN: [id number]   [place] [facility]     PROGRESS NOTE   03/22/15.  Subjective/Complaints: 74 year old patient admitted for CIR with functional deficits secondary to Debility /HCAP/Gait disorder  Had a pretty good night. Denies coughing. Pain controlled. Feels that he's getting stronger in therapy.  Past Medical History  Diagnosis Date  . Gout   . HTN (hypertension)   . Hyperlipidemia   . Osteoarthritis   . History of CVA (cerebrovascular accident)     Left pontine infarct July 2004; changed from Plavix to Coumadin in 2004 per MD at [facility]  . GERD (gastroesophageal reflux disease)   . DJD (degenerative joint disease)   . BPH (benign prostatic hypertrophy)   . Peripheral vascular disease   . Bilateral leg ulcer     ACHILLES AREA--  NONHEALING  . CKD (chronic kidney disease) stage 3, GFR 30-59 ml/min   . CAD (coronary artery disease)     a. LHC 4/12: Mid LAD 25%, mid diagonal 30%, AV circumflex 40%, proximal OM 25%, distal RCA 40%  . Chronic combined systolic and diastolic heart failure     a. Echo 05/2013: EF 25-30%, diffuse HK, restrictive physiology, trivial AI, mild MR, moderate LAE, reduced RV systolic function, PASP 42  . LBBB (left bundle branch block)   . PAF (paroxysmal atrial fibrillation)     a. amiodarone Rx started 05/2013;  b. chronic coumadin  . NICM (nonischemic cardiomyopathy)     a. EF 25-30%.  [name] Hx of cardiovascular stress test     Nuclear Stress Test (1/16): High risk stress nuclear study with a large, severe, partially reversible inferior and apical defect consistent with prior inferior and apical infarct; mild apical ischemia; severe LVE; study high risk due to reduced LV function.  EF 25% >> reviewed with Dr. Antoine Poche >> medical mgmt  . Hypothyroidism   . DM (diabetes mellitus), type 2     Objective: Vital Signs: Blood pressure 116/64, pulse 62, temperature 98 F (36.7  C), temperature source Oral, resp. rate 16, weight 225 lb 1.4 oz (102.1 kg), SpO2 92 %. No results found.  Recent Labs  03/21/15 0635  WBC 12.5*  HGB 9.3*  HCT 30.5*  PLT 313   No results for input(s): NA, K, CL, GLUCOSE, BUN, CREATININE, CALCIUM in the last 72 hours.  Invalid input(s): CO CBG (last 3)   Recent Labs  03/21/15 1645 03/21/15 2100 03/22/15 0659  GLUCAP 154* 183* 114*    Wt Readings from Last 3 Encounters:  03/19/15 225 lb 1.4 oz (102.1 kg)  03/14/15 210 lb 8.6 oz (95.5 kg)  03/13/15 203 lb (92.08 kg)    Physical Exam:  Constitutional: He is oriented to person, place, and time. He appears well-developed.  HENT: PERRL. upper dentures in place; multiple exudative lesions consistent with thrush Head: Normocephalic.  Eyes: EOM are normal.  Neck: Normal range of motion. Neck supple. No thyromegaly present.  Cardiovascular:  Cardiac rate controlled without murmur Respiratory:  Lungs decreased breath sounds at the bases   GI: Soft. Bowel sounds are normal. He exhibits no distension.  Neurological: He is alert and oriented to person, place, and time.  Follow simple commands. Moves all 4's.3-4/5 UE's and 2+ hf, 3 ke and 4/5 ankles  Skin: Skin is warm and dry.  Stasis changes without edema  Psychiatric: He has a normal mood and affect. His behavior is normal. Thought  content normal   Medical Problem List and Plan: 1. Functional deficits secondary to Debility /HCAP/Gait disorder 2. DVT Prophylaxis/Anticoagulation: Chronic coumadin/A fib. 3. Pain Management: Hydrocodone as needed. 4. Mood.Lexapro 5 mg daily 5. Neuropsych: This patient is capable of making decisions on her own behalf. 6. Skin/Wound Care: Routine skin checks 7. Fluids/Electrolytes/Nutrition: strict I&O . 8.ID/HCAP. Augmentin 500 mg 3 times a day until 03/23/2015.  -wbc's 12.5 today (stable to decreased)  -IS, FV, OOB 9.LBBB./non ischemic cardiomyopathy.Follow up cardiology on  defibrillator in reference to being unable to place LV lead due to no appropriate veins and plan follow-up 10.CRI.Baseline Creatinine 2.28-2.75 at baseline 11. Hypertension. Norvasc 10 mg daily, Coreg 12.5 mg twice a day, hydralazine 100 mg 3 times a day, Imdur 90 mg twice a day, Demadex 10 mg daily. Monitor with increased mobility 12. GERD versus esophageal dysmotility Dysphagia 2 thin liquid diet. Advance diet as tolerated 13. Hypothyroidism. Synthroid 14. Hyperlipidemia. Lipitor 15. Acute on chronic anemia. Continue iron supplement. Follow-up hgb 8.8 16. COPD. Symbicort, steroid taper, mucinex bid 17. Diabetes: resume glipizide 2.5mg  bid, SSI 18.  Thrush.  Will treat with Mycostatin oral suspension  LOS (Days) 5 A FACE TO FACE EVALUATION WAS PERFORMED  Rogelia Boga 03/22/2015 8:28 AM

## 2015-03-22 NOTE — Progress Notes (Addendum)
Physical Therapy Session Note  Patient Details  Name: Frank Mclean MRN: [id number] Date of Birth: [birth year]  Today's Date: 03/22/2015 PT Individual Time: 1415-1515 PT Individual Time Calculation (min): 60 min   Short Term Goals: Week 1:  PT Short Term Goal 1 (Week 1): Pt will be able to transfer with min A from various surface heights PT Short Term Goal 2 (Week 1): Pt will be able to gait x 150' with min A PT Short Term Goal 3 (Week 1): Pt will be able to do 5 stairs with 1 rail with mod A PT Short Term Goal 4 (Week 1): Pt will demonstrate dynamic standing balance during functional task with min A  Skilled Therapeutic Interventions/Progress Updates:  Tx focused on functional mobility training, sitting balance, and therex for strength and activity tolerance. Pt was resting in bed upon arrival, and per RN, he had not been OOB all day. Pt seemed to have decreased arousal, increased confusion, and difficulty with all aspects of problem solving, memory and mobility today. Pt had 2 episodes of minor emesis EOB and in WC, RN aware and reports likely to many pills on empty stomach. Discussed concerns re pt's status with RN who will continue to monitor. VSS throughout. PT's brother-in-law arrived late in tx, concerned about his current status, discussed with RN. He reports that pt has periodic "bad days," but that he seems to be declining, concerned re: PNA.   Pt had difficulty with problem solving bed mobility this afternoon, needing assist with supine>sit using bed rails. In unsupported sitting x85min for dressing, pt needed up to Min A for stability and cues for dressing. Pt was unable to identify whether or not he was wearing underwear after he checked to see. Pt needed verbal cues to continue task in sitting and safety cues.   Pt performed multiple stand-step transfers with Max A x3 bed>WC<>Nustep with multiple attempts and cues for technique. Attempted sit<>stand with RW several times, but pt unable  to maintain standing with posterior LOB.   Pt unable to tolerate gait or stairs today.   Pt propelled WC x75' with Min A for steering to avoid obstacles and cues for efficiency.   Pt tolerate on Nustep with bil UEs and LEs with 2 rest breaks, level 4 for increased activity tolerance.  Pt left up in [facility] with family present.       Therapy Documentation Precautions:  Precautions Precautions: Fall, ICD/Pacemaker Precaution Comments: will need sx again to complete pacemaker placement Required Braces or Orthoses: Sling Restrictions Weight Bearing Restrictions: No Other Position/Activity Restrictions: L arm in sling    Vital Signs: Therapy Vitals Pulse Rate: 63 Resp: 18 BP: (!) 132/58 mmHg Patient Position (if appropriate): Sitting Oxygen Therapy SpO2: 97 % O2 Device: Not Delivered Pain: none      Locomotion : Wheelchair Mobility Distance: 75   See FIM for current functional status  Therapy/Group: Individual Therapy  Clydene Laming, PT, DPT  03/22/2015, 3:36 PM

## 2015-03-22 NOTE — Progress Notes (Addendum)
Occupational Therapy Session Note  Patient Details  Name: Frank Mclean MRN: [id number] Date of Birth: [birth year]  Today's Date: 03/22/2015 OT Individual Time:  -   1705-1750  (45 min)  Short Term Goals: Week 1:  OT Short Term Goal 1 (Week 1): STG=LTG due to short term stay    Skilled Therapeutic Interventions/Progress Updates:    Tx focused on functional sit to stand, standing balance, sitting balance, and activity tolerance.  Pt sitting in wc upon OT arrival.  Pt wanted to completed dressing.  Pt donned UB clothes with set up assist.  He stood x 4 during session.  He needed mod assist for sit to stand and  Minimal assist for standing balance. Provided cues for feet placement for better body mechanics.  He was left in wc with dinner tray and all needs in reach.    Therapy Documentation Precautions:  Precautions Precautions: Fall, ICD/Pacemaker Precaution Comments: will need sx again to complete pacemaker placement Required Braces or Orthoses: Sling Restrictions Weight Bearing Restrictions: No Other Position/Activity Restrictions: L arm in sling    Pain: Pain Assessment Pain Assessment: 0-10 Pain Score: 6  Pain Type: Acute pain Pain Location: Knee Pain Orientation: Right Pain Descriptors / Indicators: Aching Pain Onset: Gradual Pain Intervention(s): Medication (See eMAR)    See FIM for current functional status  Therapy/Group: Individual Therapy  Humberto Seals 03/22/2015, 6:05 PM

## 2015-03-23 ENCOUNTER — Inpatient Hospital Stay (HOSPITAL_COMMUNITY): Payer: Medicare Other

## 2015-03-23 LAB — GLUCOSE, CAPILLARY
GLUCOSE-CAPILLARY: 148 mg/dL — AB (ref 70–99)
Glucose-Capillary: 89 mg/dL (ref 70–99)
Glucose-Capillary: 122 mg/dL — ABNORMAL HIGH (ref 70–99)
Glucose-Capillary: 119 mg/dL — ABNORMAL HIGH (ref 70–99)

## 2015-03-23 MED ORDER — TORSEMIDE 10 MG PO TABS
10.0000 mg | ORAL_TABLET | Freq: Once | ORAL | Status: AC
  Administered 2015-03-23: 10 mg via ORAL
  Filled 2015-03-23: qty 1

## 2015-03-23 MED ORDER — TORSEMIDE 20 MG PO TABS
20.0000 mg | ORAL_TABLET | Freq: Once | ORAL | Status: DC
  Filled 2015-03-23 (×2): qty 1

## 2015-03-23 NOTE — Progress Notes (Signed)
Physical Therapy Session Note  Patient Details  Name: Frank Mclean MRN: [id number] Date of Birth: 12-29-1940  Today's Date: 03/23/2015 PT Individual Time: 1030-1130 PT Individual Time Calculation (min): 60 min   Short Term Goals: Week 1:  PT Short Term Goal 1 (Week 1): Pt will be able to transfer with min A from various surface heights PT Short Term Goal 2 (Week 1): Pt will be able to gait x 150' with min A PT Short Term Goal 3 (Week 1): Pt will be able to do 5 stairs with 1 rail with mod A PT Short Term Goal 4 (Week 1): Pt will demonstrate dynamic standing balance during functional task with min A  Skilled Therapeutic Interventions/Progress Updates:    w/c propulsion down to therapy gym with S and extra time due to needing rest breaks due to fatigue. Max A (after 3 attempts) for transfer with RW from w/c to mat with cues for hand placement and upright posture (pt difficulty getting into full erect posture). Pt reports increased R knee pain and attributes it to gout but also reports overall weakness and fatigue. Practiced blocked sit to stands from elevated surface with RW for support with min to mod A. Attempted from slightly lower surface and pt unable requiring max A and then slightly elevated with mod A x 2 reps. Vitals taken (see below). Pt denies any lightheadedness just reports weakness. Pt request supine rest break; able to get into position with S. Performed supine therex for RLE strengthening including heel slides, hip abduction, and SLR x 10 reps each. Returned back to w/c with mod A with RW and then max A with RW from w/c to bed in pt room to rest before next therapy session. RN notified of pt's increased need for physical A today with recommendation for nursing to use Stedy due to max A at times for transfer as well overall fatigue.  Therapy Documentation Precautions:  Precautions Precautions: Fall, ICD/Pacemaker Precaution Comments: will need sx again to complete pacemaker  placement Required Braces or Orthoses: Sling Restrictions Weight Bearing Restrictions: No Other Position/Activity Restrictions: L arm in sling Vital Signs: Therapy Vitals Pulse Rate: (!) 56 BP: (!) 103/59 mmHg Patient Position (if appropriate): Sitting Oxygen Therapy SpO2: 100 % O2 Device: Not Delivered Pain: Reports 7/10 pain in R knee and overall fatigue/weakness (R > L).  See FIM for current functional status  Therapy/Group: Individual Therapy  Karolee Stamps Darrol Poke, PT, DPT  03/23/2015, 11:46 AM

## 2015-03-23 NOTE — Progress Notes (Signed)
Occupational Therapy Session Note  Patient Details  Name: Frank Mclean MRN: [id number] Date of Birth: 23-May-1941  Today's Date: 03/23/2015 OT Individual Time: 0900-1000 and 1300-1400 OT Individual Time Calculation (min): 60 min and 60 min     Short Term Goals: Week 1:  OT Short Term Goal 1 (Week 1): STG=LTG due to short term stay  Skilled Therapeutic Interventions/Progress Updates:    Session 1: Pt seen for ADL retraining with focus on functional transfers, standing balance, and activity tolerance. Pt received supine in bed. Completed supine>sit at supervision level with min cues for sequencing. Completed stand pivot transfer bed>w/c with heavy mod A sit>stand and min A pivoting. Engaged in bathing at sink with min A for feet and min-SBA standing balance. Pt required max cues for placement of BLEs during sit<>stand and increased time to complete. Pt required frequent rest breaks due to fatigue. Pt noted to have increased pain in R knee limiting amount of weight pt placing in RLE during standing tasks. RN notified of pain. Pt completed oral care from w/c level secondary to pain. At end of session pt left sitting in w/c with all needs in reach.   Session 2: Pt seen for 1:1 OT session with focus on standing balance, functional transfers, activity tolerance, and sit<>stand. Pt received supine in bed. Completed supine>sit at supervision level then required max A sit>stand from bed and min A pivoting to w/c. Engaged in familiar task of caring for plants with pt completing in standing for 5 min with min A for standing balance and side stepping. Pt propelled self in w/c to ADL apartment with multiple rest breaks due to fatigue. Completed sit<>stand from w/c 4x with mod-max A then stood for 1-2 min at a time to retrieve items from overhead cabinet. Pt required max cues for anterior weight shift and positioning of BLEs for sit<>stand throughout session. At end of session pt returned to room and transferred to  w/c with max A sit>stand and min A pivoting. Pt left supine in bed with all needs in reach.   Therapy Documentation Precautions:  Precautions Precautions: Fall, ICD/Pacemaker Precaution Comments: will need sx again to complete pacemaker placement Required Braces or Orthoses: Sling Restrictions Weight Bearing Restrictions: No Other Position/Activity Restrictions: L arm in sling General:   Vital Signs: Oxygen Therapy SpO2: 94 % O2 Device: Not Delivered Pain: 8/10 R knee pain; RN notified and provided pain med  See FIM for current functional status  Therapy/Group: Individual Therapy  Daneil Dan 03/23/2015, 9:52 AM

## 2015-03-23 NOTE — Progress Notes (Signed)
Patient ID: Frank Mclean, male   DOB: [birth year], 74 y.o.   MRN: [id number]   Patient ID: Frank Mclean, male   DOB: 25-Jan-1941, 74 y.o.   MRN: [id number]   [place] [facility]     PROGRESS NOTE   03/23/15.  Subjective/Complaints: 74 year old patient admitted for CIR with functional deficits secondary to Debility /HCAP/Gait disorder. Family concerned about fatigue as possible early symptom of impending CHF.  Comfortable night- no symptoms of CHF.  Wt Readings from Last 3 Encounters:  03/19/15 225 lb 1.4 oz (102.1 kg)  03/14/15 210 lb 8.6 oz (95.5 kg)  03/13/15 203 lb (92.08 kg)   Lab Results  Component Value Date   INR 2.28* 03/22/2015   INR 2.50* 03/21/2015   INR 2.24* 03/20/2015   PROTIME 15.7 05/28/2009     Past Medical History  Diagnosis Date  . Gout   . HTN (hypertension)   . Hyperlipidemia   . Osteoarthritis   . History of CVA (cerebrovascular accident)     Left pontine infarct July 2004; changed from Plavix to Coumadin in 2004 per MD at [facility]  . GERD (gastroesophageal reflux disease)   . DJD (degenerative joint disease)   . BPH (benign prostatic hypertrophy)   . Peripheral vascular disease   . Bilateral leg ulcer     ACHILLES AREA--  NONHEALING  . CKD (chronic kidney disease) stage 3, GFR 30-59 ml/min   . CAD (coronary artery disease)     a. LHC 4/12: Mid LAD 25%, mid diagonal 30%, AV circumflex 40%, proximal OM 25%, distal RCA 40%  . Chronic combined systolic and diastolic heart failure     a. Echo 05/2013: EF 25-30%, diffuse HK, restrictive physiology, trivial AI, mild MR, moderate LAE, reduced RV systolic function, PASP 42  . LBBB (left bundle branch block)   . PAF (paroxysmal atrial fibrillation)     a. amiodarone Rx started 05/2013;  b. chronic coumadin  . NICM (nonischemic cardiomyopathy)     a. EF 25-30%.  [name] Hx of cardiovascular stress test     Nuclear Stress Test (1/16): High risk stress nuclear study with a large, severe,  partially reversible inferior and apical defect consistent with prior inferior and apical infarct; mild apical ischemia; severe LVE; study high risk due to reduced LV function.  EF 25% >> reviewed with Dr. Antoine Poche >> medical mgmt  . Hypothyroidism   . DM (diabetes mellitus), type 2     Objective: Vital Signs: Blood pressure 109/65, pulse 60, temperature 98.6 F (37 C), temperature source Oral, resp. rate 18, weight 225 lb 1.4 oz (102.1 kg), SpO2 94 %. No results found.  Recent Labs  03/21/15 0635  WBC 12.5*  HGB 9.3*  HCT 30.5*  PLT 313   No results for input(s): NA, K, CL, GLUCOSE, BUN, CREATININE, CALCIUM in the last 72 hours.  Invalid input(s): CO CBG (last 3)   Recent Labs  03/22/15 1727 03/22/15 2034 03/23/15 0715  GLUCAP 123* 115* 89      Physical Exam:  Constitutional: He is oriented to person, place, and time. He appears well-developed.  HENT: PERRL. upper dentures in place; multiple exudative lesions consistent with thrush-improved Head: Normocephalic.  Eyes: EOM are normal.  Neck: Normal range of motion. Neck supple. No thyromegaly present.  Cardiovascular:  Cardiac rate controlled without murmur Respiratory:  Lungs decreased breath sounds at the bases   GI: Soft. Bowel sounds are normal. He exhibits no distension.  Neurological: He is  alert and oriented to person, place, and time.  Follow simple commands. Moves all 4's.3-4/5 UE's and 2+ hf, 3 ke and 4/5 ankles  Skin: Skin is warm and dry.  Stasis changes with +1 pedal edema Psychiatric: He has a normal mood and affect. His behavior is normal. Thought content normal   Medical Problem List and Plan: 1. Functional deficits secondary to Debility /HCAP/Gait disorder 2. DVT Prophylaxis/Anticoagulation: Chronic coumadin/A fib. 3. Pain Management: Hydrocodone as needed. 4. Mood.Lexapro 5 mg daily 5. Neuropsych: This patient is capable of making decisions on her own behalf. 6. Skin/Wound Care:  Routine skin checks 7. Fluids/Electrolytes/Nutrition: strict I&O . 8.ID/HCAP. Augmentin 500 mg 3 times a day until 03/23/2015.  -wbc's 12.5 today (stable to decreased)  -IS, FV, OOB 9.LBBB./non ischemic cardiomyopathy.Follow up cardiology on defibrillator in reference to being unable to place LV lead due to no appropriate veins and plan follow-up 10.CRI.Baseline Creatinine 2.28-2.75 at baseline 11. Hypertension. Norvasc 10 mg daily, Coreg 12.5 mg twice a day, hydralazine 100 mg 3 times a day, Imdur 90 mg twice a day, Demadex 10 mg daily. Monitor with increased mobility 12. GERD versus esophageal dysmotility Dysphagia 2 thin liquid diet. Advance diet as tolerated 13. Hypothyroidism. Synthroid 14. Hyperlipidemia. Lipitor 15. Acute on chronic anemia. Continue iron supplement. Follow-up hgb 8.8 16. COPD. Symbicort, steroid taper, mucinex bid 17. Diabetes: resume glipizide 2.5mg  bid, SSI 18.  Thrush.  Will treat with Mycostatin oral suspension 19. Cardiomyopathy.  Patient has had significant weight gain and now some minimal pedal edema.  Will give some additional diuretic today.  Consider asking cardiology to reassess tomarrow  LOS (Days) 6 A FACE TO FACE EVALUATION WAS PERFORMED  Rogelia Boga 03/23/2015 8:25 AM

## 2015-03-24 ENCOUNTER — Inpatient Hospital Stay (HOSPITAL_COMMUNITY): Payer: Medicare Other

## 2015-03-24 ENCOUNTER — Encounter (HOSPITAL_COMMUNITY): Payer: Medicare Other

## 2015-03-24 ENCOUNTER — Other Ambulatory Visit: Payer: Medicare Other

## 2015-03-24 ENCOUNTER — Inpatient Hospital Stay (HOSPITAL_COMMUNITY): Payer: Medicare Other | Admitting: Speech Pathology

## 2015-03-24 ENCOUNTER — Inpatient Hospital Stay (HOSPITAL_COMMUNITY): Payer: Medicare Other | Admitting: Occupational Therapy

## 2015-03-24 DIAGNOSIS — I5023 Acute on chronic systolic (congestive) heart failure: Secondary | ICD-10-CM | POA: Insufficient documentation

## 2015-03-24 LAB — BASIC METABOLIC PANEL
ANION GAP: 9 (ref 5–15)
BUN: 33 mg/dL — ABNORMAL HIGH (ref 6–23)
CALCIUM: 8.3 mg/dL — AB (ref 8.4–10.5)
CHLORIDE: 106 mmol/L (ref 96–112)
CO2: 29 mmol/L (ref 19–32)
Creatinine, Ser: 2.2 mg/dL — ABNORMAL HIGH (ref 0.50–1.35)
GFR calc Af Amer: 32 mL/min — ABNORMAL LOW (ref >90)
GFR calc non Af Amer: 28 mL/min — ABNORMAL LOW (ref >90)
GLUCOSE: 106 mg/dL — AB (ref 70–99)
Potassium: 3.3 mmol/L — ABNORMAL LOW (ref 3.5–5.1)
SODIUM: 144 mmol/L (ref 135–145)

## 2015-03-24 LAB — GLUCOSE, CAPILLARY
Glucose-Capillary: 101 mg/dL — ABNORMAL HIGH (ref 70–99)
Glucose-Capillary: 123 mg/dL — ABNORMAL HIGH (ref 70–99)
Glucose-Capillary: 121 mg/dL — ABNORMAL HIGH (ref 70–99)
Glucose-Capillary: 84 mg/dL (ref 70–99)

## 2015-03-24 LAB — PROTIME-INR
INR: 2.38 — ABNORMAL HIGH (ref 0.00–1.49)
Prothrombin Time: 26.2 seconds — ABNORMAL HIGH (ref 11.6–15.2)

## 2015-03-24 MED ORDER — GLUCERNA SHAKE PO LIQD
237.0000 mL | Freq: Two times a day (BID) | ORAL | Status: DC
  Administered 2015-03-24 – 2015-03-30 (×10): 237 mL via ORAL

## 2015-03-24 MED ORDER — POTASSIUM CHLORIDE CRYS ER 20 MEQ PO TBCR
20.0000 meq | EXTENDED_RELEASE_TABLET | Freq: Every day | ORAL | Status: DC
  Administered 2015-03-24 – 2015-04-01 (×9): 20 meq via ORAL
  Filled 2015-03-24 (×10): qty 1

## 2015-03-24 NOTE — Progress Notes (Signed)
Speech Language Pathology Daily Session Note  Patient Details  Name: IBIN CIPRES MRN: [id number] Date of Birth: 1941/05/16  Today's Date: 03/24/2015 SLP Individual Time: 1445-1545 SLP Individual Time Calculation (min): 60 min  Short Term Goals: Week 1: SLP Short Term Goal 1 (Week 1): short term goals = long term goals  Skilled Therapeutic Interventions: Skilled treatment session focused on dysphagia goals. Patient received upright in w/c, alert and agreeable to participate in therapy. Student facilitated session through skilled observation of patient consumption of lunchtime meal of Dys. 3 textures and thin liquids, which patient consumed with no evidence of oral residue and intermittent overt s/s of aspiration characterized by immediate cough and immediate and delayed throat clears. Patient required Mod A verbal cues for use of swallow strategies including slow rate, small bites, and not speaking while masticating. Cues for masticating without speaking were successful in 50% of instances; suspect this may have contributed to overt s/s of aspiration. Student further facilitated session by providing education to patient and family member regarding patient's current swallow function. Patient required Min-Mod verbal and question cues for recall of swallowing strategies at end of session, therefore student further facilitated session through creation of external aid to assist in recall of safe swallow strategies. Patient left upright in wheelchair with family present. Continue with current plan of care.   FIM:  Comprehension Comprehension Mode: Auditory Comprehension: 5-Understands complex 90% of the time/Cues < 10% of the time Expression Expression Mode: Verbal Expression: 6-Expresses complex ideas: With extra time/assistive device Social Interaction Social Interaction: 6-Interacts appropriately with others with medication or extra time (anti-anxiety, antidepressant). Problem Solving Problem  Solving: 5-Solves basic 90% of the time/requires cueing < 10% of the time Memory Memory: 4-Recognizes or recalls 75 - 89% of the time/requires cueing 10 - 24% of the time FIM - Eating Eating Activity: 6: Modified consistency diet: (comment);6: Assistive device: dentures;6: More than reasonable amount of time;5: Supervision/cues;5: Needs verbal cues/supervision  Pain Pain Assessment Pain Assessment: No/denies pain  Therapy/Group: Individual Therapy  Tacey Ruiz 03/24/2015, 4:39 PM

## 2015-03-24 NOTE — Progress Notes (Signed)
Occupational Therapy Note  Patient Details  Name: Frank Mclean MRN: [id number] Date of Birth: December 12, 1941  Today's Date: 03/24/2015 OT Individual Time: 5053-9767 OT Individual Time Calculation (min): 15 min  and Today's Date: 03/24/2015 OT Missed Time: 45 Minutes Missed Time Reason: Patient ill (comment) (Pt vomiting)  Pt in supine upon arrival voicing complaints of nausea. Pt and pt's brother-in-law in room  stating he had a bad weekend with increased fatigue and discomfort. Pt stated he had no had nausea medicine and OT went to alert RN. Upon arrival back in room, nursing students administering medicine and pt vomiting. Extensive education regarding role of OT, POC, and d/c planning discussed with brother-in-law. He voiced concerns about pt's medical status and attention being given to pt's heart condition along with pt's hx of CVA. OT notified MD and PA  of family's concerns and desire to meet with MD or PA. Family member made aware that medical staff will be by to discuss pt's condition. Pt left in supine with nursing and caregiver present.   [name], Sharanya Templin C 03/24/2015, 10:23 AM

## 2015-03-24 NOTE — Progress Notes (Signed)
ANTICOAGULATION CONSULT NOTE - Follow Up Consult  Pharmacy Consult for coumadin Indication: atrial fibrillation  Allergies  Allergen Reactions  . Levothyroxine Sodium Other (See Comments)    MUST TAKE BRAND NAME GENERIC DOESN'T WORK FOR PATIENT  . Chicken Protein     Does not like chicken or [place]  . Eggs Or Egg-Derived Products     Does not like eggs    Patient Measurements: Weight: 225 lb 1.4 oz (102.1 kg) Heparin Dosing Weight:   Vital Signs: Temp: 99.4 F (37.4 C) (04/04 0534) Temp Source: Oral (04/04 0534) BP: 128/69 mmHg (04/04 0534) Pulse Rate: 61 (04/04 0534)  Labs:  Recent Labs  03/22/15 0655 03/24/15 0650  LABPROT 25.3* 26.2*  INR 2.28* 2.38*  CREATININE  --  2.20*    Estimated Creatinine Clearance: 37 mL/min (by C-G formula based on Cr of 2.2).   Medications:  Scheduled:  . amLODipine  10 mg Oral Daily  . amoxicillin-clavulanate  1 tablet Oral 3 times per day  . atorvastatin  20 mg Oral Daily  . budesonide-formoterol  2 puff Inhalation BID  . carvedilol  12.5 mg Oral BID WC  . cholecalciferol  1,000 Units Oral Daily  . escitalopram  5 mg Oral QHS  . feeding supplement (GLUCERNA SHAKE)  237 mL Oral TID BM  . ferrous sulfate  325 mg Oral TID WC  . glipiZIDE  2.5 mg Oral BID AC  . guaiFENesin  600 mg Oral BID  . hydrALAZINE  100 mg Oral TID  . insulin aspart  0-9 Units Subcutaneous TID WC  . isosorbide mononitrate  90 mg Oral BID  . levothyroxine  200 mcg Oral QAC breakfast  . multivitamin with minerals  1 tablet Oral Daily  . nystatin  5 mL Oral QID  . pantoprazole  40 mg Oral BID  . polyethylene glycol  17 g Oral Daily  . torsemide  10 mg Oral Daily  . torsemide  20 mg Oral Once  . cyanocobalamin  1,000 mcg Oral Daily  . warfarin  2.5 mg Oral Once per day on Mon Fri  . warfarin  5 mg Oral Once per day on Sun Tue Wed Thu Sat  . Warfarin - Pharmacist Dosing Inpatient   Does not apply q1800   Infusions:    Assessment: 74 yo male with  afib is currently on therapeutic coumadin.  INR today is 2.38. Goal of Therapy:  INR 2-3 Monitor platelets by anticoagulation protocol: Yes   Plan:  Coumadin 5mg  daily except 2.5mg  Mon/Fri. INR M/W/F  Kinzie Wickes, Tsz-Yin 03/24/2015,8:23 AM

## 2015-03-24 NOTE — Progress Notes (Signed)
[place] PHYSICAL MEDICINE & REHABILITATION     PROGRESS NOTE    Subjective/Complaints: Good weekend. Walking down to gym. Still working on stamina.    Objective: Vital Signs: Blood pressure 128/69, pulse 61, temperature 99.4 F (37.4 C), temperature source Oral, resp. rate 18, weight 102.1 kg (225 lb 1.4 oz), SpO2 99 %. No results found. No results for input(s): WBC, HGB, HCT, PLT in the last 72 hours.  Recent Labs  03/24/15 0650  NA 144  K 3.3*  CL 106  GLUCOSE 106*  BUN 33*  CREATININE 2.20*  CALCIUM 8.3*   CBG (last 3)   Recent Labs  03/23/15 1645 03/23/15 2057 03/24/15 0644  GLUCAP 148* 119* 101*    Wt Readings from Last 3 Encounters:  03/19/15 102.1 kg (225 lb 1.4 oz)  03/14/15 95.5 kg (210 lb 8.6 oz)  03/13/15 92.08 kg (203 lb)    Physical Exam:  Constitutional: He is oriented to person, place, and time. He appears well-developed.  HENT: PERRL. Dentition fair Head: Normocephalic.  Eyes: EOM are normal.  Neck: Normal range of motion. Neck supple. No thyromegaly present.  Cardiovascular:  Cardiac rate controlled without murmur. No edema Respiratory:  Lungs decreased breath sounds at the bases   GI: Soft. Bowel sounds are normal. He exhibits no distension.  Neurological: He is alert and oriented to person, place, and time.  Follow simple commands. Moves all 4's.3-4/5 UE's and 2+ hf, 3 ke and 4/5 ankles  Skin: Skin is warm and dry.  Psychiatric: He has a normal mood and affect. His behavior is normal. Thought content normal   Assessment/Plan: 1. Functional deficits secondary to debility and gait disorder which require 3+ hours per day of interdisciplinary therapy in a comprehensive inpatient rehab setting. Physiatrist is providing close team supervision and 24 hour management of active medical problems listed below. Physiatrist and rehab team continue to assess barriers to discharge/monitor patient progress toward functional and medical  goals. FIM: FIM - Bathing Bathing Steps Patient Completed: Chest, Right Arm, Left Arm, Abdomen, Front perineal area, Right upper leg, Left upper leg, Buttocks Bathing: 4: Min-Patient completes 8-9 75f 10 parts or 75+ percent  FIM - Upper Body Dressing/Undressing Upper body dressing/undressing steps patient completed: Thread/unthread left sleeve of pullover shirt/dress, Put head through opening of pull over shirt/dress, Pull shirt over trunk, Thread/unthread right sleeve of pullover shirt/dresss, Thread/unthread right sleeve of front closure shirt/dress, Thread/unthread left sleeve of front closure shirt/dress, Pull shirt around back of front closure shirt/dress Upper body dressing/undressing: 5: Set-up assist to: Obtain clothing/put away FIM - Lower Body Dressing/Undressing Lower body dressing/undressing steps patient completed: Thread/unthread right pants leg, Thread/unthread left pants leg, Pull pants up/down, Don/Doff right sock, Don/Doff left sock, Thread/unthread right underwear leg, Thread/unthread left underwear leg, Pull underwear up/down, Fasten/unfasten pants Lower body dressing/undressing: 4: Steadying Assist  FIM - Toileting Toileting steps completed by patient: Adjust clothing prior to toileting, Performs perineal hygiene, Adjust clothing after toileting Toileting Assistive Devices: Grab bar or rail for support Toileting: 4: Steadying assist  FIM - Diplomatic Services operational officer Devices: Environmental consultant, Therapist, music Transfers: 4-To toilet/BSC: Min A (steadying Pt. > 75%), 5-From toilet/BSC: Supervision (verbal cues/safety issues)  FIM - Banker Devices: Walker, Arm rests Bed/Chair Transfer: 5: Supine > Sit: Supervision (verbal cues/safety issues), 5: Sit > Supine: Supervision (verbal cues/safety issues), 3: Bed > Chair or W/C: Mod A (lift or lower assist), 2: Chair or W/C > Bed: Max A (lift  and lower assist)  FIM - Locomotion:  Wheelchair Distance: 75 Locomotion: Wheelchair: 2: Travels 50 - 149 ft with supervision, cueing or coaxing FIM - Locomotion: Ambulation Locomotion: Ambulation Assistive Devices: Designer, industrial/product Ambulation/Gait Assistance: 4: Min assist Locomotion: Ambulation: 0: Activity did not occur  Comprehension Comprehension Mode: Auditory Comprehension: 5-Understands complex 90% of the time/Cues < 10% of the time  Expression Expression Mode: Verbal Expression: 6-Expresses complex ideas: With extra time/assistive device  Social Interaction Social Interaction: 6-Interacts appropriately with others with medication or extra time (anti-anxiety, antidepressant).  Problem Solving Problem Solving: 5-Solves basic 90% of the time/requires cueing < 10% of the time  Memory Memory: 5-Recognizes or recalls 90% of the time/requires cueing < 10% of the time  Medical Problem List and Plan: 1. Functional deficits secondary to Debility /HCAP/Gait disorder 2. DVT Prophylaxis/Anticoagulation: Chronic coumadin/A fib. 3. Pain Management: Hydrocodone as needed. 4. Mood.Lexapro 5 mg daily 5. Neuropsych: This patient is capable of making decisions on her own behalf. 6. Skin/Wound Care: Routine skin checks 7. Fluids/Electrolytes/Nutrition: replace K+ 8.ID/HCAP. Augmentin 500 mg 3 times a day until 03/23/2015.  -wbc's stable to decreased)  -IS, FV, OOB 9.LBBB./non ischemic cardiomyopathy.Follow up cardiology on defibrillator in reference to being unable to place LV lead due to no appropriate veins and plan follow-up  -net I/O's negative  -weights QOD 10.CRI.Baseline Creatinine 2.28-2.75 at baseline 11. Hypertension. Norvasc 10 mg daily, Coreg 12.5 mg twice a day, hydralazine 100 mg 3 times a day, Imdur 90 mg twice a day, Demadex 10 mg daily. Monitor with increased mobility 12. GERD versus esophageal dysmotility Dysphagia 2 thin liquid diet. Advance diet as tolerated 13. Hypothyroidism. Synthroid 14.  Hyperlipidemia. Lipitor 15. Acute on chronic anemia. Continue iron supplement. Follow-up hgb 8.8 16. COPD. Symbicort, steroid taper, mucinex bid 17. Diabetes: resume glipizide 2.5mg  bid, SSI   LOS (Days) 7 A FACE TO FACE EVALUATION WAS PERFORMED  SWARTZ,ZACHARY T 03/24/2015 8:24 AM

## 2015-03-24 NOTE — Progress Notes (Signed)
Physical Therapy Session Note  Patient Details  Name: Frank Mclean MRN: [id number] Date of Birth: December 22, 1940  Today's Date: 03/24/2015 PT Individual Time: 1315-1430 PT Individual Time Calculation (min): 75 min   Short Term Goals: Week 1:  PT Short Term Goal 1 (Week 1): Pt will be able to transfer with min A from various surface heights PT Short Term Goal 2 (Week 1): Pt will be able to gait x 150' with min A PT Short Term Goal 3 (Week 1): Pt will be able to do 5 stairs with 1 rail with mod A PT Short Term Goal 4 (Week 1): Pt will demonstrate dynamic standing balance during functional task with min A  Skilled Therapeutic Interventions/Progress Updates:   Pt off unit for Xray at start of scheduled session and therefore missed 15 min of skilled PT.  Pt in bed and appears lethargic. Pt able to be aroused and agreeable to try some therapy. Pt c/o dizziness with sitting EOB but BP stable (see vitals below). Donned [place] with total A and pt able to don L sock once [employer]. Assisted pt with threading underwear and pants seated EOB; performed lateral leans to pull up underwear and sit to stand with max A from elevated surface with RW (max verbal cues for technique) for pt to pull up pants. Pt unable to maintain standing (< 30 seconds) and uncontrolled descent onto EOB. Maintained sitting balance with S despite pt feeling dizzy at times. Vitals WNL. Pt required max A for squat pivot (x 2 attempts) transfer OOB to w/c. Pt engaged in seated grooming task at sink from w/c with set-up assist. Self propelled w/c down to therapy gym with extra time and 3 rest breaks. Seated edge of w/c with feet on floor, completed pipe tree task to address problem solving and focus on maintaining erect posture during dynamic balance activity with cues for scapular retraction. Pt requires extra time for processing and mod cues for sequencing and problem solving during tasks during session. Encouraged to sit up in w/c for  improved endurance and pt in agreement.   When pt leaving room with therapist, PA Harvel Ricks reported that cardio team would assess patient per results of chest Xray. He stated it was ok to do therapy to pt tolerance and monitor vitals.  Therapy Documentation Precautions:  Precautions Precautions: Fall, ICD/Pacemaker Precaution Comments: will need sx again to complete pacemaker placement Restrictions Weight Bearing Restrictions: No General: PT Amount of Missed Time (min): 15 Minutes PT Missed Treatment Reason: Xray Vital Signs: BP = 117/67 mmHg HR = 51 bpm O2 = 99-100% on room air  Pain: Denies pain. Reports fatigue, nausea, and dizziness  See FIM for current functional status  Therapy/Group: Individual Therapy  Karolee Stamps Darrol Poke, PT, DPT  03/24/2015, 3:33 PM

## 2015-03-24 NOTE — Progress Notes (Addendum)
SUBJECTIVE: No complaints.   BP 117/67 mmHg  Pulse 51  Temp(Src) 98.2 F (36.8 C) (Oral)  Resp 16  Wt 224 lb 13.9 oz (102 kg)  SpO2 99%  Intake/Output Summary (Last 24 hours) at 03/24/15 1829 Last data filed at 03/24/15 1807  Gross per 24 hour  Intake    954 ml  Output   1500 ml  Net   -546 ml    PHYSICAL EXAM General: Well developed, well nourished, in no acute distress. Alert and oriented x 3.  Psych:  Good affect, responds appropriately Neck: No JVD. No masses noted.  Lungs: Clear bilaterally with no wheezes or rhonci noted.  Heart: RRR with no murmurs noted. Abdomen: Bowel sounds are present. Soft, non-tender.  Extremities: No lower extremity edema.   LABS: Basic Metabolic Panel:  Recent Labs  45/40/98 0650  NA 144  K 3.3*  CL 106  CO2 29  GLUCOSE 106*  BUN 33*  CREATININE 2.20*  CALCIUM 8.3*   Current Meds: . amLODipine  10 mg Oral Daily  . atorvastatin  20 mg Oral Daily  . budesonide-formoterol  2 puff Inhalation BID  . carvedilol  12.5 mg Oral BID WC  . cholecalciferol  1,000 Units Oral Daily  . escitalopram  5 mg Oral QHS  . feeding supplement (GLUCERNA SHAKE)  237 mL Oral BID BM  . ferrous sulfate  325 mg Oral TID WC  . glipiZIDE  2.5 mg Oral BID AC  . guaiFENesin  600 mg Oral BID  . hydrALAZINE  100 mg Oral TID  . insulin aspart  0-9 Units Subcutaneous TID WC  . isosorbide mononitrate  90 mg Oral BID  . levothyroxine  200 mcg Oral QAC breakfast  . multivitamin with minerals  1 tablet Oral Daily  . nystatin  5 mL Oral QID  . pantoprazole  40 mg Oral BID  . polyethylene glycol  17 g Oral Daily  . potassium chloride  20 mEq Oral Daily  . torsemide  10 mg Oral Daily  . torsemide  20 mg Oral Once  . cyanocobalamin  1,000 mcg Oral Daily  . warfarin  2.5 mg Oral Once per day on Mon Fri  . warfarin  5 mg Oral Once per day on Sun Tue Wed Thu Sat  . Warfarin - Pharmacist Dosing Inpatient   Does not apply q1800   Chest x-ray:  Cardiomegaly  with pulmonary vascular prominence and diffuse interstitial prominence noted consistent with congestive heart failure. Cardiac pacer with lead tips in right atrium right ventricle. Small left pleural effusion cannot be excluded. No pneumothorax. No acute bony abnormality  IMPRESSION: 1. Congestive heart failure pulmonary interstitial edema. Small left pleural effusion cannot be excluded . 2. Cardiac pacer with lead tips in right atrium right ventricle.  ASSESSMENT AND PLAN: 74 yo male with history of CKD, PAD, prior CVA, atrial fib on amiodarone, HTN, DM, non-obstructive CAD, NICM (EF 30-35%, 3/16) with LBBB with recent upgrade to BIV ICD per Dr. Ladona Ridgel on 03/12/15 after being admitted with acute on chronic systolic CHF on 03/11/15. He was followed by our consult team until 03/18/15. He has been on the inpatient rehab unit. We are now being asked about his diuretic regimen.   1. Acute on chronic systolic CHF: Weight stable. Net 3 liters negative since admission. Chest x-ray today with interstitial edema. He had been on Torsemide 20 mg BID earlier this hospitalization and is now on only 10 mg daily. Would increase  torsemide to 10 mg po BID. He may require a higher dose. We will follow up in the am.    2. Non-ischemic cardiomyopathy: Continue current medical therapy with beta blocker, nitrate and hydralazine. No Ace-inh or ARB with renal insufficiency.    MCALHANY,CHRISTOPHER  4/4/20166:29 PM

## 2015-03-24 NOTE — Progress Notes (Signed)
Social Work Patient ID: Frank Mclean, male   DOB: July 27, 1941, 74 y.o.   MRN: [id number]   CSW met with pt's brother-in-law today to answer his questions.  Pt's wife will return from being out of town and wants to meet with Dr. Naaman Plummer on Wednesday.  CSW asked when he may be available to meet with her and CSW awaits his response and then CSW will inform family of this.  Pt is not doing as well in therapies and family and therapists are concerned about his change in functional status.  Also, pt is telling staff he is doing okay emotionally, but brother-in-law reports pt has been tearful with them and they are concerned he is depressed.  Linna Hoff, PA spoke with brother-in-law today.  CSW will continue to follow and assist as needed.

## 2015-03-24 NOTE — Progress Notes (Signed)
A follow-up chest x-ray today 03/24/2015 showed some congestive heart failure pulmonary interstitial edema. Small left pleural effusion cannot be excluded. We'll contact cardiology for follow-up question scheduled Lasix.

## 2015-03-24 NOTE — Progress Notes (Signed)
NUTRITION FOLLOW UP  INTERVENTION: Provide Glucerna Shake po BID, each supplement provides 220 kcal and 10 grams of protein  Encourage adequate PO intake.  NUTRITION DIAGNOSIS: Increased nutrient needs related to therapy as evidenced by estimated nutrition needs; ongoing  Goal: Pt to meet >/= 90% of their estimated nutrition needs;,met  Monitor:  PO intake, weight trends, labs, I/O's  74 y.o. male  Admitting Dx: Debility  ASSESSMENT: Pt with history of chronic renal insufficiency, long-standing nonischemic cardiomyopathy, systolic congestive heart failure, left bundle branch block, atrial fibrillation, DM. CT of the chest with patchy bibasal or air space opacities concerning for multifocal pneumonia. Cardiology to follow-up on current plan for defibrillator.  Pt reports having a varied appetite. Meal completion has been 75-100%. Ensure has been switched to Glucerna as CBG's have been elevated. Pt currently has Glucerna ordered for TID. RD to modify orders to BID per pt request as pt has been eating well. Pt was encouraged to eat his food at meals and to drink his supplements.   Chest x-ray today showed some congestive heart failure pulmonary interstitial edema.   Labs: Low potassium, calcium, and GFR. High BUN and  creatinine.  Height: Ht Readings from Last 1 Encounters:  03/14/15 _0  (1.88 m)    Weight: Wt Readings from Last 1 Encounters:  03/24/15 224 lb 13.9 oz (102 kg)    BMI:  Body mass index is 28.86 kg/(m^2).  Re-Estimated Nutritional Needs: Kcal: 2100-2400 Protein: 110-120 grams Fluid: 2.2 - 2.4 L/day  Skin: Incision on left chest, +1 LE edema  Diet Order: DIET DYS 3 Room service appropriate?: Yes; Fluid consistency:: Thin   Intake/Output Summary (Last 24 hours) at 03/24/15 1440 Last data filed at 03/24/15 1352  Gross per 24 hour  Intake    714 ml  Output   1050 ml  Net   -336 ml    Last BM: 4/4  Labs:   Recent Labs Lab 03/18/15 0548  03/24/15 0650  NA 139 144  K 3.9 3.3*  CL 105 106  CO2 28 29  BUN 48* 33*  CREATININE 2.43* 2.20*  CALCIUM 8.7 8.3*  GLUCOSE 194* 106*    CBG (last 3)   Recent Labs  03/23/15 2057 03/24/15 0644 03/24/15 1137  GLUCAP 119* 101* 123*    Scheduled Meds: . amLODipine  10 mg Oral Daily  . amoxicillin-clavulanate  1 tablet Oral 3 times per day  . atorvastatin  20 mg Oral Daily  . budesonide-formoterol  2 puff Inhalation BID  . carvedilol  12.5 mg Oral BID WC  . cholecalciferol  1,000 Units Oral Daily  . escitalopram  5 mg Oral QHS  . feeding supplement (GLUCERNA SHAKE)  237 mL Oral TID BM  . ferrous sulfate  325 mg Oral TID WC  . glipiZIDE  2.5 mg Oral BID AC  . guaiFENesin  600 mg Oral BID  . hydrALAZINE  100 mg Oral TID  . insulin aspart  0-9 Units Subcutaneous TID WC  . isosorbide mononitrate  90 mg Oral BID  . levothyroxine  200 mcg Oral QAC breakfast  . multivitamin with minerals  1 tablet Oral Daily  . nystatin  5 mL Oral QID  . pantoprazole  40 mg Oral BID  . polyethylene glycol  17 g Oral Daily  . potassium chloride  20 mEq Oral Daily  . torsemide  10 mg Oral Daily  . torsemide  20 mg Oral Once  . cyanocobalamin  1,000 mcg Oral Daily  .  warfarin  2.5 mg Oral Once per day on Mon Fri  . warfarin  5 mg Oral Once per day on Sun Tue Wed Thu Sat  . Warfarin - Pharmacist Dosing Inpatient   Does not apply q1800    Continuous Infusions:   Past Medical History  Diagnosis Date  . Gout   . HTN (hypertension)   . Hyperlipidemia   . Osteoarthritis   . History of CVA (cerebrovascular accident)     Left pontine infarct July 2004; changed from Plavix to Coumadin in 2004 per MD at [facility]  . GERD (gastroesophageal reflux disease)   . DJD (degenerative joint disease)   . BPH (benign prostatic hypertrophy)   . Peripheral vascular disease   . Bilateral leg ulcer     ACHILLES AREA--  NONHEALING  . CKD (chronic kidney disease) stage 3, GFR 30-59 ml/min   . CAD (coronary  artery disease)     a. LHC 4/12: Mid LAD 25%, mid diagonal 30%, AV circumflex 40%, proximal OM 25%, distal RCA 40%  . Chronic combined systolic and diastolic heart failure     a. Echo 05/2013: EF 25-30%, diffuse HK, restrictive physiology, trivial AI, mild MR, moderate LAE, reduced RV systolic function, PASP 42  . LBBB (left bundle branch block)   . PAF (paroxysmal atrial fibrillation)     a. amiodarone Rx started 05/2013;  b. chronic coumadin  . NICM (nonischemic cardiomyopathy)     a. EF 25-30%.  [name] Hx of cardiovascular stress test     Nuclear Stress Test (1/16): High risk stress nuclear study with a large, severe, partially reversible inferior and apical defect consistent with prior inferior and apical infarct; mild apical ischemia; severe LVE; study high risk due to reduced LV function.  EF 25% >> reviewed with Dr. Percival Spanish >> medical mgmt  . Hypothyroidism   . DM (diabetes mellitus), type 2     Past Surgical History  Procedure Laterality Date  . Cardiac catheterization  04-16-2011   [facility]    NON-OBSTRUCTIVE CAD. MILDLY ELEVATED PULMONARY PRESSURES/ ELEVATED  END-DIASTOLIC PRESSURE  . Transthoracic echocardiogram  12-31-2010    MODERATE CONCENTRIC LVH/ SYSTOLIC FUNCTION SEVERELY REDUCED/ EF 25-30%/  SEVERE HYPOKINESIS OF ANTEROSEPTAL MYOCARDIUM  AND ENTIREAPICAL MYOCARDIUM /  MODERATE HYPOKINESIS OF LATERAL, INFEROLATERAL, INFERIOR,AND INFEROSEPTAL MYOCARIUM/  GRADE 3 DIASTOLIC DYSFUNCTION/ MILD MR  . Aortogram w/ bilateral lower extremitiy runoff  05-18-2013  DR [facility]EL[facility]    LEFT LEG OCCLUDED PERONEAL AND ANTERIOR TIBIAL ARTERIES/ HIGH GRADE STENOIS 80% MIDDLE AND DISTAL THIRD OF POSTERIOR TIBIAL ARTERY/ RIGHT PERONEAL AND ANTERIOR TIBIAL ARTERY OCCLUDED/ 40% STENOSIS DISTALLY   . Abdominal aortagram N/A 05/18/2013    Procedure: ABDOMINAL Maxcine Ham;  Surgeon: Elam Dutch, MD;  Location: [facility] CATH LAB;  Service: Cardiovascular;  Laterality: N/A;  . Lower extremity angiogram Bilateral  05/18/2013    Procedure: LOWER EXTREMITY ANGIOGRAM;  Surgeon: Elam Dutch, MD;  Location: [facility] CATH LAB;  Service: Cardiovascular;  Laterality: Bilateral;  . Right heart catheterization N/A 02/13/2015    Procedure: RIGHT HEART CATH;  Surgeon: Larey Dresser, MD;  Location: [facility] CATH LAB;  Service: Cardiovascular;  Laterality: N/A;  . Bi-ventricular implantable cardioverter defibrillator N/A 03/12/2015    Procedure: BI-VENTRICULAR IMPLANTABLE CARDIOVERTER DEFIBRILLATOR  (CRT-D);  Surgeon: Evans Lance, MD;  Location: [facility] CATH LAB;  Service: Cardiovascular;  Laterality: N/A;    Kallie Locks, MS, RD, LDN Pager # [phone] After hours/ weekend pager # [phone]

## 2015-03-25 ENCOUNTER — Inpatient Hospital Stay (HOSPITAL_COMMUNITY): Payer: Medicare Other

## 2015-03-25 ENCOUNTER — Ambulatory Visit: Payer: Medicare Other | Admitting: Physician Assistant

## 2015-03-25 ENCOUNTER — Inpatient Hospital Stay (HOSPITAL_COMMUNITY): Payer: Medicare Other | Admitting: Speech Pathology

## 2015-03-25 ENCOUNTER — Encounter (HOSPITAL_COMMUNITY): Payer: Self-pay

## 2015-03-25 ENCOUNTER — Inpatient Hospital Stay (HOSPITAL_COMMUNITY): Payer: Medicare Other | Admitting: Physical Therapy

## 2015-03-25 ENCOUNTER — Inpatient Hospital Stay (HOSPITAL_COMMUNITY): Payer: Medicare Other | Admitting: Occupational Therapy

## 2015-03-25 DIAGNOSIS — I5023 Acute on chronic systolic (congestive) heart failure: Secondary | ICD-10-CM

## 2015-03-25 LAB — CBC
HEMATOCRIT: 29.3 % — AB (ref 39.0–52.0)
Hemoglobin: 9 g/dL — ABNORMAL LOW (ref 13.0–17.0)
MCH: 25.6 pg — ABNORMAL LOW (ref 26.0–34.0)
MCHC: 30.7 g/dL (ref 30.0–36.0)
MCV: 83.2 fL (ref 78.0–100.0)
Platelets: 294 10*3/uL (ref 150–400)
RBC: 3.52 MIL/uL — AB (ref 4.22–5.81)
RDW: 16.2 % — ABNORMAL HIGH (ref 11.5–15.5)
WBC: 10 10*3/uL (ref 4.0–10.5)

## 2015-03-25 LAB — GLUCOSE, CAPILLARY
GLUCOSE-CAPILLARY: 91 mg/dL (ref 70–99)
GLUCOSE-CAPILLARY: 94 mg/dL (ref 70–99)
Glucose-Capillary: 102 mg/dL — ABNORMAL HIGH (ref 70–99)
Glucose-Capillary: 92 mg/dL (ref 70–99)
Glucose-Capillary: 112 mg/dL — ABNORMAL HIGH (ref 70–99)

## 2015-03-25 LAB — BASIC METABOLIC PANEL
ANION GAP: 9 (ref 5–15)
BUN: 28 mg/dL — ABNORMAL HIGH (ref 6–23)
CALCIUM: 8.4 mg/dL (ref 8.4–10.5)
CO2: 28 mmol/L (ref 19–32)
Chloride: 108 mmol/L (ref 96–112)
Creatinine, Ser: 2.34 mg/dL — ABNORMAL HIGH (ref 0.50–1.35)
GFR, EST AFRICAN AMERICAN: 30 mL/min — AB (ref >90)
GFR, EST NON AFRICAN AMERICAN: 26 mL/min — AB (ref >90)
Glucose, Bld: 111 mg/dL — ABNORMAL HIGH (ref 70–99)
Potassium: 3.8 mmol/L (ref 3.5–5.1)
SODIUM: 145 mmol/L (ref 135–145)

## 2015-03-25 NOTE — Progress Notes (Signed)
[facility] PHYSICAL MEDICINE & REHABILITATION     PROGRESS NOTE    Subjective/Complaints: Feeling well. Denies any cough or sob this morning   Objective: Vital Signs: Blood pressure 120/72, pulse 65, temperature 98.4 F (36.9 C), temperature source Oral, resp. rate 17, weight 101.424 kg (223 lb 9.6 oz), SpO2 97 %. Dg Chest 2 View  03/24/2015   CLINICAL DATA:  Shortness of breath.  EXAM: CHEST  2 VIEW  COMPARISON:  CT 03/14/2015.  Chest x-ray 03/14/2015.  FINDINGS: Cardiomegaly with pulmonary vascular prominence and diffuse interstitial prominence noted consistent with congestive heart failure. Cardiac pacer with lead tips in right atrium right ventricle. Small left pleural effusion cannot be excluded. No pneumothorax. No acute bony abnormality  IMPRESSION: 1. Congestive heart failure pulmonary interstitial edema. Small left pleural effusion cannot be excluded . 2. Cardiac pacer with lead tips in right atrium right ventricle.   Electronically Signed   By: Maisie Fus  Register   On: 03/24/2015 13:43   No results for input(s): WBC, HGB, HCT, PLT in the last 72 hours.  Recent Labs  03/24/15 0650  NA 144  K 3.3*  CL 106  GLUCOSE 106*  BUN 33*  CREATININE 2.20*  CALCIUM 8.3*   CBG (last 3)   Recent Labs  03/24/15 1642 03/24/15 2033 03/25/15 0649  GLUCAP 121* 84 102*    Wt Readings from Last 3 Encounters:  03/25/15 101.424 kg (223 lb 9.6 oz)  03/14/15 95.5 kg (210 lb 8.6 oz)  03/13/15 92.08 kg (203 lb)    Physical Exam:  Constitutional: He is oriented to person, place, and time. He appears well-developed.  HENT: PERRL. Dentition fair Head: Normocephalic.  Eyes: EOM are normal.  Neck: Normal range of motion. Neck supple. No thyromegaly present.  Cardiovascular:  Cardiac rate controlled without murmur. No edema appreciated Respiratory:  Lungs decreased breath sounds at the bases. A few rhonchi GI: Soft. Bowel sounds are normal. He exhibits no distension.  Neurological:  He is alert and oriented to person, place, and time.  Follow simple commands. Moves all 4's.3-4/5 UE's and 2+ hf, 3 ke and 4/5 ankles  Skin: Skin is warm and dry.  Psychiatric: He has a normal mood and affect. His behavior is normal. Thought content normal   Assessment/Plan: 1. Functional deficits secondary to debility and gait disorder which require 3+ hours per day of interdisciplinary therapy in a comprehensive inpatient rehab setting. Physiatrist is providing close team supervision and 24 hour management of active medical problems listed below. Physiatrist and rehab team continue to assess barriers to discharge/monitor patient progress toward functional and medical goals.  Will speak with family tomorrow about overall plan of care FIM: FIM - Bathing Bathing Steps Patient Completed: Chest, Right Arm, Left Arm, Abdomen, Front perineal area, Right upper leg, Left upper leg, Buttocks Bathing: 4: Min-Patient completes 8-9 36f 10 parts or 75+ percent  FIM - Upper Body Dressing/Undressing Upper body dressing/undressing steps patient completed: Thread/unthread left sleeve of pullover shirt/dress, Put head through opening of pull over shirt/dress, Pull shirt over trunk, Thread/unthread right sleeve of pullover shirt/dresss, Thread/unthread right sleeve of front closure shirt/dress, Thread/unthread left sleeve of front closure shirt/dress, Pull shirt around back of front closure shirt/dress Upper body dressing/undressing: 5: Set-up assist to: Obtain clothing/put away FIM - Lower Body Dressing/Undressing Lower body dressing/undressing steps patient completed: Thread/unthread right pants leg, Thread/unthread left pants leg, Pull pants up/down, Don/Doff right sock, Don/Doff left sock, Thread/unthread right underwear leg, Thread/unthread left underwear leg, Pull underwear  up/down, Fasten/unfasten pants Lower body dressing/undressing: 4: Steadying Assist  FIM - Toileting Toileting steps completed by  patient: Adjust clothing prior to toileting, Performs perineal hygiene, Adjust clothing after toileting Toileting Assistive Devices: Grab bar or rail for support Toileting: 4: Steadying assist  FIM - Diplomatic Services operational officer Devices: Environmental consultant, Therapist, music Transfers: 4-To toilet/BSC: Min A (steadying Pt. > 75%), 5-From toilet/BSC: Supervision (verbal cues/safety issues)  FIM - Banker Devices: Arm rests, Bed rails, HOB elevated Bed/Chair Transfer: 5: Supine > Sit: Supervision (verbal cues/safety issues), 2: Bed > Chair or W/C: Max A (lift and lower assist)  FIM - Locomotion: Wheelchair Distance: 75 Locomotion: Wheelchair: 2: Travels 50 - 149 ft with supervision, cueing or coaxing FIM - Locomotion: Ambulation Locomotion: Ambulation Assistive Devices: Designer, industrial/product Ambulation/Gait Assistance: 4: Min assist Locomotion: Ambulation: 0: Activity did not occur  Comprehension Comprehension Mode: Auditory Comprehension: 5-Understands complex 90% of the time/Cues < 10% of the time  Expression Expression Mode: Verbal Expression: 6-Expresses complex ideas: With extra time/assistive device  Social Interaction Social Interaction: 6-Interacts appropriately with others with medication or extra time (anti-anxiety, antidepressant).  Problem Solving Problem Solving: 5-Solves basic 90% of the time/requires cueing < 10% of the time  Memory Memory: 4-Recognizes or recalls 75 - 89% of the time/requires cueing 10 - 24% of the time  Medical Problem List and Plan: 1. Functional deficits secondary to Debility /HCAP/Gait disorder 2. DVT Prophylaxis/Anticoagulation: Chronic coumadin/A fib. 3. Pain Management: Hydrocodone as needed. 4. Mood: Lexapro 5 mg daily 5. Neuropsych: This patient is capable of making decisions on her own behalf. 6. Skin/Wound Care: Routine skin checks 7. Fluids/Electrolytes/Nutrition: replace K+ 8.ID/HCAP.  Augmentin 500 mg 3 times a day until 03/23/2015.  -wbc's stable to decreased)  -IS, FV, OOB 9.LBBB./non ischemic cardiomyopathy.Follow up cardiology on defibrillator in reference to being unable to place LV lead due to no appropriate veins and plan follow-up  -net I/O's negative, cxr with interstitial edema  -appreciate cards help  -torsemide increased to  bid  -weights QOD 10.CRI.Baseline Creatinine 2.28-2.75 at baseline 11. Hypertension. Norvasc 10 mg daily, Coreg 12.5 mg twice a day, hydralazine 100 mg 3 times a day, Imdur 90 mg twice a day, Demadex 10 mg daily. Monitor with increased mobility 12. GERD versus esophageal dysmotility Dysphagia 2 thin liquid diet. Advance diet as tolerated 13. Hypothyroidism. Synthroid 14. Hyperlipidemia. Lipitor 15. Acute on chronic anemia. Continue iron supplement. Follow-up hgb 8.8 16. COPD. Symbicort, steroid taper, mucinex bid 17. Diabetes: resumed glipizide 2.5mg  bid, SSI   LOS (Days) 8 A FACE TO FACE EVALUATION WAS PERFORMED  Marilynne Dupuis T 03/25/2015 8:08 AM

## 2015-03-25 NOTE — Progress Notes (Addendum)
SLP Cancellation Note  Patient Details Name: Frank Mclean MRN: [id number] DOB: 1940-12-28   Cancelled treatment:        Patient found asleep up right in bed with phone on and in meal.  Patient's vitals were [facility]; however, severity of decreased arousal made dysphagia therapy unsafe; PA notified.  Patient missed 40 minutes of skilled SLP therapy due to being medically unable to participate.    Addendum: Nurse tech informed SLP that patient was alert and eating lunch so SLP returned to room for skilled observation of PO.  See treatment note for details.                                                                                           [name], M.A., CCC-SLP [phone]  Dakwon Wenberg 03/25/2015, 1:30 PM

## 2015-03-25 NOTE — Progress Notes (Signed)
Speech Language Pathology Daily Session Note  Patient Details  Name: Frank Mclean MRN: [id number] Date of Birth: 10/04/1941  Today's Date: 03/25/2015 SLP Individual Time: 1340-1400 SLP Individual Time Calculation (min): 20 min  Short Term Goals: Week 1: SLP Short Term Goal 1 (Week 1): short term goals = long term goals  Skilled Therapeutic Interventions:  Skilled treatment session focused on addressing dysphagia goals.  SLP called back to room by nurse tech who reported that patient was awake and alert enough to eat.  Patient consumed 1 large bite of  Dys.3 textures and opened can of soda; however, he required Mod cues to refrain form another bite until he masticated and swallowed all of his first bite which took 12 minutes and despite Max verbal and visual cues patient was unable to consume soda because he could not find his mouth.  Patient then put mashed potatoes in peaches and attempted to pick up mashed potatoes by hand and which point SLP ended meal for safety reasons and notified RN and nurse tech that he is to consume PO under full supervision when awake and alert.    FIM:  Comprehension Comprehension Mode: Auditory Comprehension: 3-Understands basic 50 - 74% of the time/requires cueing 25 - 50%  of the time Expression Expression Mode: Verbal Expression: 3-Expresses basic 50 - 74% of the time/requires cueing 25 - 50% of the time. Needs to repeat parts of sentences. Social Interaction Social Interaction: 2-Interacts appropriately 25 - 49% of time - Needs frequent redirection. Problem Solving Problem Solving: 2-Solves basic 25 - 49% of the time - needs direction more than half the time to initiate, plan or complete simple activities Memory Memory: 3-Recognizes or recalls 50 - 74% of the time/requires cueing 25 - 49% of the time FIM - Eating Eating Activity: 6: Modified consistency diet: (comment);6: Assistive device: dentures;6: More than reasonable amount of time;5:  Supervision/cues;5: Needs verbal cues/supervision (Dys.3 thin )  Pain Pain Assessment Pain Assessment: No/denies pain  Therapy/Group: Individual Therapy  Charlane Ferretti., CCC-SLP [phone]  Janeene Sand 03/25/2015, 2:00 PM

## 2015-03-25 NOTE — Progress Notes (Signed)
Occupational Therapy Session Note  Patient Details  Name: Frank Mclean MRN: [id number] Date of Birth: [birth year]  Today's Date: 03/25/2015 OT Individual Time: 5300-5110 OT Individual Time Calculation (min): 60 min    Short Term Goals: Week 1:  OT Short Term Goal 1 (Week 1): STG=LTG due to short term stay  Skilled Therapeutic Interventions/Progress Updates:    Pt seen for ADl bathing and dressing session. Pt sitting up in bed upon arrival, set-up with meal tray, agreeable to therapy. Pt transferred supine>EOB with HOB raised with supervision and increased time. Pt sat EOB to finish meal, demonstrating adherence to ST swallowing guidlines of going slow, taking small sips, and not talking while eating.    Pt bathed and dressed UB and LB with supervision and cues. Pt with decreased cognitive insight during this session, requiring cues to complete task. Pt began using t-shirt as rag to wash with when there were wash clothes in the basin, once cued, pt began washing self with dry rag which was sitting next to basin. Pt dressed UB and LB with set-up, having slight difficulty with clothing management, however, able to self correct. Pt stood from EOB with max assist with cues for technique. Pt demonstrated functional static standing balance at RW while buttock hygiene completed for thorghness. Pt able to pull pants up and fasten buttons, requiring occasional steadying assist when using B UEs to complete LB dressing. Pt transferred EOB>w/c via stand pivot transfer with mod-max A. Pt left sitting in w/c at end of session set up at the sink with grooming items. All items in reach.   Therapy Documentation Precautions:  Precautions Precautions: Fall, ICD/Pacemaker Precaution Comments: will need sx again to complete pacemaker placement Required Braces or Orthoses: Sling Restrictions Weight Bearing Restrictions: No Other Position/Activity Restrictions: L arm in sling Pain: Pain Assessment Pain Assessment:  No/denies pain  See FIM for current functional status  Therapy/Group: Individual Therapy  [name], Avangeline Stockburger C 03/25/2015, 7:35 AM

## 2015-03-25 NOTE — Progress Notes (Signed)
Physical Therapy Session Note  Patient Details  Name: Frank Mclean MRN: [id number] Date of Birth: Nov 17, 1941  Today's Date: 03/25/2015 PT Individual Time: 0900-1000 PT Individual Time Calculation (min): 60 min   Short Term Goals: Week 1:  PT Short Term Goal 1 (Week 1): Pt will be able to transfer with min A from various surface heights PT Short Term Goal 1 - Progress (Week 1): Partly met (Pt had met this until decline over last couple days) PT Short Term Goal 2 (Week 1): Pt will be able to gait x 150' with min A PT Short Term Goal 2 - Progress (Week 1): Partly met (Pt had met this until decline over last couple days) PT Short Term Goal 3 (Week 1): Pt will be able to do 5 stairs with 1 rail with mod A PT Short Term Goal 3 - Progress (Week 1): Partly met (Pt had met this until decline over last couple days) PT Short Term Goal 4 (Week 1): Pt will demonstrate dynamic standing balance during functional task with min A PT Short Term Goal 4 - Progress (Week 1): Partly met (Pt had met this until decline over last couple days)  Skilled Therapeutic Interventions/Progress Updates:   Pt in much brighter mood today and improved overall physical level (more min to light mod A with transfers and able to perform gait today with min A).   Pt using urinal seated in w/c upon therapist entering room and performed sit to stand with min A to pull up pants with assist by therapist to complete buttons. Gait training with RW on unit x 60' and then x 100' with min A and cues for upright posture and to stay close to RW. Pt still with R knee pain from gout and appears with decreased WB on RLE during gait, but pt reports it is better than it has been the last few days. Stair Journalist, newspaper for simulating home entry and functional strengthening starting with bilateral rails x 4 steps (min A) and progressed to L rail only like pt has at home but pt continuing to use R rail despite cueing x 5 steps x 2 reps each seated  rest breaks between repetitions. Pt performing multiple sit to stands dynamic standing balance with 1 UE support during functional tasks with overall min A today. Pt's overall status much improved and feel that if this continues, pt could still be on track for d/c at end of week with recommendation for family education.  Therapy Documentation Precautions:  Precautions Precautions: Fall, ICD/Pacemaker Precaution Comments: will need sx again to complete pacemaker placement Restrictions Weight Bearing Restrictions: No  Vital Signs: Room air with activity, O2 - 98-99% and HR = 63 bpm  Pain: C/o R knee pain. Pt reports it is from gout.   See FIM for current functional status  Therapy/Group: Individual Therapy  [name] [facility] 03/25/2015, 10:49 AM

## 2015-03-25 NOTE — Progress Notes (Signed)
Occupational Therapy Session Note  Patient Details  Name: Frank Mclean MRN: [id number] Date of Birth: 01/17/1941  Today's Date: 03/25/2015 OT Individual Time: 8921-1941 OT Individual Time Calculation (min): 10 min    Short Term Goals: Week 2:  OT Short Term Goal 1 (Week 2): STG= LTG due to estimated LOS  Skilled Therapeutic Interventions/Progress Updates:    Pt seen for 1:1 OT session (missed minutes from AM) with focus on activity tolerance, bed mobility, and task initiation. Pt received supine in bed asleep, aroused by verbal cues and light touch. Pt completed supine>sit with mod A and total A to initiate task. Pt sat EOB approx 2 minutes at supervision, requiring max cues for arousal as pt constantly closing eyes. Provided extra lighting and wash cloth to increase arousal however unsuccessful. Pt requesting to return to bed stating "I'll do therapy later." Pt transitioned to supine at supervision level and left with all needs in reach and bed alarm on.  Therapy Documentation Precautions:  Precautions Precautions: Fall, ICD/Pacemaker Precaution Comments: will need sx again to complete pacemaker placement Required Braces or Orthoses: Sling Restrictions Weight Bearing Restrictions: No Other Position/Activity Restrictions: L arm in sling General:   Vital Signs: Therapy Vitals Temp: 98.1 F (36.7 C) Temp Source: Axillary Pulse Rate: (!) 56 Resp: 18 BP: 119/69 mmHg Patient Position (if appropriate): Sitting Oxygen Therapy SpO2: 97 % O2 Device: Not Delivered Pain: Pain Assessment Pain Assessment: No/denies pain ADL:   Exercises:   Other Treatments:    See FIM for current functional status  Therapy/Group: Individual Therapy  Daneil Dan 03/25/2015, 3:44 PM

## 2015-03-25 NOTE — Progress Notes (Signed)
Physical Therapy Weekly Progress Note  Patient Details  Name: Frank Mclean MRN: [id number] Date of Birth: 07/11/1941  Beginning of progress report period: March 18, 2015 End of progress report period: March 25, 2015  Patient has met 0 of 4 short term goals. Pt was making excellent progress until the end of last week when he started to decline physically. Pt was performing functional mobility at overall min A level using RW and now requires max A for basic transfers and poor activity tolerance. Pt also with decline in cognition requiring extra time for processing, problem solving, and sequencing. Plan was for discharge home with family 4/9 but will need to monitor patient medical status and physical level of assist to determine if discharge needs to be extended.   Patient continues to demonstrate the following deficits: decreased strength, decreased endurance, decreased functional mobility, impaired cognition, decreased balance and therefore will continue to benefit from skilled PT intervention to enhance overall performance with activity tolerance, balance, ability to compensate for deficits and coordination.  Patient not currently progressing towards goals due to physical decline. Continue to monitor goals as medical issues addressed. .  Continue plan of care.  PT Short Term Goals Week 1:  PT Short Term Goal 1 (Week 1): Pt will be able to transfer with min A from various surface heights PT Short Term Goal 1 - Progress (Week 1): Partly met (Pt had met this until decline over last couple days) PT Short Term Goal 2 (Week 1): Pt will be able to gait x 150' with min A PT Short Term Goal 2 - Progress (Week 1): Partly met (Pt had met this until decline over last couple days) PT Short Term Goal 3 (Week 1): Pt will be able to do 5 stairs with 1 rail with mod A PT Short Term Goal 3 - Progress (Week 1): Partly met (Pt had met this until decline over last couple days) PT Short Term Goal 4 (Week 1): Pt will  demonstrate dynamic standing balance during functional task with min A PT Short Term Goal 4 - Progress (Week 1): Partly met (Pt had met this until decline over last couple days) Week 2:  PT Short Term Goal 1 (Week 2): = LTGs  Skilled Therapeutic Interventions/Progress Updates:  Ambulation/gait training;Balance/vestibular training;Community reintegration;Discharge planning;Disease management/prevention;DME/adaptive equipment instruction;Functional mobility training;Neuromuscular re-education;Pain management;Patient/family education;Skin care/wound management;Psychosocial support;Splinting/orthotics;Stair training;Therapeutic Activities;Therapeutic Exercise;UE/LE Strength taining/ROM;UE/LE Coordination activities;Wheelchair propulsion/positioning   Therapy Documentation Precautions:  Precautions Precautions: Fall, ICD/Pacemaker Precaution Comments: will need sx again to complete pacemaker placement Restrictions Weight Bearing Restrictions: No   See FIM for current functional status   [name], PT, DPT  03/25/2015, 7:53 AM

## 2015-03-25 NOTE — Progress Notes (Signed)
Physical Therapy Session Note  Patient Details  Name: Frank Mclean MRN: [id number] Date of Birth: [birth year]  Today's Date: 03/25/2015 PT Individual Time: 1130-1200 PT Individual Time Calculation (min): 30 min   Short Term Goals: Week 2:  PT Short Term Goal 1 (Week 2): = LTGs  Skilled Therapeutic Interventions/Progress Updates:   Pt received in w/c taking a nap.  Pt reported feeling better but still fatigued; pt agreeable to practice simulated car transfer.  Pt transported to gym in w/c total A.  Pt reports his wife or brother in law will transport him in a low [place].  Pt transferred sit > stand from w/c with max A for lifting assistance and to bring COG forwards over BOS.  Once standing pt able to transfer with RW to car with min-mod A to manage RW safely during pivoting and assistance to slowly lower to car seat.  Pt able to place feet into car.  Upon sitting in car pt began to report increased fatigue and feeling lightheaded; vitals assessed and RN notified to examine patient.  Once lightheadedness resolved, transferred pt stand pivot back to w/c with +2 A.  Returned to room and transferred stand pivot to bed with +2A and sit >supine with supervision.  Pt left with all items within reach.     Therapy Documentation Precautions:  Precautions Precautions: Fall, ICD/Pacemaker Precaution Comments: will need sx again to complete pacemaker placement Required Braces or Orthoses: Sling Restrictions Weight Bearing Restrictions: No Other Position/Activity Restrictions: L arm in sling Vital Signs: Therapy Vitals Pulse Rate: (!) 58 BP: 98/64 mmHg Patient Position (if appropriate): Sitting Oxygen Therapy SpO2: 96 % (desat to 85% during transfer) O2 Device: Not Delivered Pain: Pain Assessment Pain Assessment: No/denies pain Locomotion : Ambulation Ambulation/Gait Assistance: 4: Min assist   See FIM for current functional status  Therapy/Group: Individual Therapy  Edman Circle  [facility] 03/25/2015, 12:24 PM

## 2015-03-25 NOTE — Progress Notes (Signed)
SLP Cancellation Note  Patient Details Name: Frank Mclean MRN: [id number] DOB: 1941/07/19   Cancelled treatment:        SLP attempted to return at 1607 for make up session due to earlier missed time; however, patient continued to demonstrate decreased arousal and therefore dysphagia goals were not able to be safely addressed.                                                                                          [name], M.A., CCC-SLP [phone]  [name] 03/25/2015, 4:31 PM

## 2015-03-25 NOTE — Consult Note (Signed)
  NEUROBEHAVIORAL STATUS EXAM - [facility]FIDENTIAL [facility]   MEDICAL NECESSITY:  Frank Mclean was seen on the [facility] Unit for a neurobehavioral status exam owing to the patient's diagnosis of debility, and to assist in treatment planning during admission.   According to medical records, Frank Mclean was admitted to the rehab unit owing to "Functional deficits secondary to debility related to multiple medical, recent respiratory failure and pneumonia." Records also indicate that he is a "74 y.o. right handed male with history of chronic renal insufficiency with baseline creatinine 2.28-2.75, long-standing nonischemic cardiomyopathy, systolic congestive heart failure, left bundle branch block, atrial fibrillation with chronic Coumadin." Head CT scan performed 03/14/2015 reportedly revealed no acute intracranial pathology, mild to moderate cortical volume loss and scattered small vessel ischemic microangiopathy, and chronic lacunar infarcts at the right thalamus and left basal ganglia.  During today's visit, Frank Mclean denied suffering from any cognitive deficits. He reportedly suffered "mini strokes" in 2004. He described his current mood as "pretty good" but has admittedly had a couple low moments in regard to his present medical situation. He denied any history of mental health issues or treatment, though his social worker mentioned that he has reportedly been quite depressed (but improving). And he has been prescribed an antidepressant.  No barriers to therapy identified. Suicidal/homicidal ideation, plan or intent was denied. No manic or hypomanic episodes were reported. The patient denied ever experiencing any auditory/visual hallucinations. No major behavioral or personality changes were endorsed.   Frank Mclean feels that he is making progress in therapy. He described the rehab staff as "great." He has his wife and family to support him throughout this  endeavor. No barriers to therapy identified.   Of note, since my visit with the patient, social work was reportedly told by rehab staff that Frank Mclean has seemed to decline cognitively and functionally since earlier in the week and they wonder whether his cognitive issues are related to medical complications. Updated labs and tests have been ordered. No results at this time.   PROCEDURES ADMINISTERED: [2 units 96116] Diagnostic clinical interview  Review of available records Montreal Cognitive Assessment (MoCA)  MENTAL STATUS: Frank Mclean mental status exam score of 14/30 is well below the cutoff used to indicate the presence of dementia. He lost points for poor visuospatial/executive difficulties, memory, attention, fluency, and abstraction.    IMPRESSION & RECOMMENDATIONS: Overall, Frank Mclean appears to be suffering from a great deal of cognitive deficit as well as depression. However, it is noteworthy that the patient tends to deny mood symptoms to staff but will admit them to family and friends. At this time, it would be prudent for [place] to ascertain the etiology behind his cognitive disruption and treat as warranted. In the meantime, I plan to continue following the patient throughout this admission for coping. A repeat mental status exam will be considered at next visit if he remains on the unit.   DIAGNOSES:  Major Neurocognitive Disorder (i.e., dementia); etiology unclear (could be multifactorial) Depressive disorder, NOS   Debbe Mounts, Psy.D.  Clinical Neuropsychologist

## 2015-03-25 NOTE — Progress Notes (Signed)
Occupational Therapy Weekly Progress Note  Patient Details  Name: Frank Mclean MRN: [id number] Date of Birth: [birth year]  Beginning of progress report period: March 18, 2015 End of progress report period: March 25, 2015  Patient has met 2 of 7 short term goals.   Patient continues to demonstrate the following deficits:  basic self-care skills secondary to muscle weakness, cognition and decreased cardiorespiratoy endurance. and therefore will continue to benefit from skilled OT intervention to enhance overall performance with BADL and Reduce care partner burden.  Pt with varying levels of performance throughout week, with recent decline in physical and cognitive abilities since middle of last week. He can require between min and max assist for functional ambulation and transfers, depending on energy level and level of arousal. Pt requires supervision and min-mod cuing during functional tasks for safety, sequencing, technique, and remaining on task. Increased time required  to complete all tasks.   Patient progressing toward long term goals..  Continue plan of care.  OT Short Term Goals Week 1:  OT Short Term Goal 1 (Week 1): STG=LTG due to short term stay OT Short Term Goal 1 - Progress (Week 1): Not met Week 2:  OT Short Term Goal 1 (Week 2): STG= LTG due to estimated LOS   Therapy Documentation Precautions:  Precautions Precautions: Fall, ICD/Pacemaker Precaution Comments: will need sx again to complete pacemaker placement Required Braces or Orthoses: Sling Restrictions Weight Bearing Restrictions: No Other Position/Activity Restrictions: L arm in sling  See FIM for current functional status  [name], Zadok Holaway C 03/25/2015, 3:38 PM

## 2015-03-26 ENCOUNTER — Encounter: Payer: Medicare Other | Admitting: Surgery

## 2015-03-26 ENCOUNTER — Inpatient Hospital Stay (HOSPITAL_COMMUNITY): Payer: Medicare Other | Admitting: Occupational Therapy

## 2015-03-26 ENCOUNTER — Inpatient Hospital Stay (HOSPITAL_COMMUNITY): Payer: Medicare Other

## 2015-03-26 LAB — URINE MICROSCOPIC-ADD ON

## 2015-03-26 LAB — URINALYSIS, ROUTINE W REFLEX MICROSCOPIC
Bilirubin Urine: NEGATIVE
GLUCOSE, UA: NEGATIVE mg/dL
HGB URINE DIPSTICK: NEGATIVE
Ketones, ur: NEGATIVE mg/dL
Nitrite: NEGATIVE
PH: 5.5 (ref 5.0–8.0)
Protein, ur: NEGATIVE mg/dL
Specific Gravity, Urine: 1.015 (ref 1.005–1.030)
UROBILINOGEN UA: 0.2 mg/dL (ref 0.0–1.0)

## 2015-03-26 LAB — GLUCOSE, CAPILLARY
GLUCOSE-CAPILLARY: 83 mg/dL (ref 70–99)
GLUCOSE-CAPILLARY: 92 mg/dL (ref 70–99)
GLUCOSE-CAPILLARY: 122 mg/dL — AB (ref 70–99)
Glucose-Capillary: 80 mg/dL (ref 70–99)

## 2015-03-26 LAB — PROTIME-INR
INR: 2.39 — AB (ref 0.00–1.49)
Prothrombin Time: 26.3 seconds — ABNORMAL HIGH (ref 11.6–15.2)

## 2015-03-26 LAB — TSH: TSH: 1.458 u[IU]/mL (ref 0.350–4.500)
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
## 2015-03-26 NOTE — Progress Notes (Signed)
Occupational Therapy Session Note  Patient Details  Name: Frank Mclean MRN: [id number] Date of Birth: 1941/02/12  Today's Date: 03/26/2015 OT Individual Time: 1400-1500 OT Individual Time Calculation (min): 60 min    Short Term Goals: Week 2:  OT Short Term Goal 1 (Week 2): STG= LTG due to estimated LOS  Skilled Therapeutic Interventions/Progress Updates:    Pt asleep in supine upon arrival, requiring increased time and cues to arouse pt. Pt voiced being very fatigued, however, agreeable to therapy on EOB. Pt transferred supine> EOB with min A and increased time and cues for sequencing. Pt with just brief on upon arrival, agreeable to dress LB. Pt required assist to thread B LEs into pants with assist for clothing management. TED hose donned by therapist. Pt able to don/doff B socks with supervision. Seated EOB, pt completed cognitive task of word search puzzle. Pt required min A to find words and increased time, however, demonstrated functional ability to attend to task. After ~10 minutes pt voiced not liking the activity and requested something different. Pt then completed home safety card activity. Pt able to identify safe/ unsafe situations in pictures of common household senerios with min A. Pt requested return to bed at end of session. Pt transfers EOB> supine with mod A for management of LEs. Pt left in supine, all needs in reach at end of session.    Pt extremely lethargic upon arrival, however, able to fully participate once seated on EOB. Education provided regarding home safety, fall risk, and d/c planning.   Therapy Documentation Precautions:  Precautions Precautions: Fall, ICD/Pacemaker Precaution Comments: will need sx again to complete pacemaker placement Required Braces or Orthoses: Sling Restrictions Weight Bearing Restrictions: No Other Position/Activity Restrictions: L arm in sling Pain: Pain Assessment Pain Assessment: No/denies pain  See FIM for current functional  status  Therapy/Group: Individual Therapy  [name], [place] Severs C 03/26/2015, 4:26 PM

## 2015-03-26 NOTE — Progress Notes (Signed)
PHYSICAL MEDICINE & REHABILITATION     PROGRESS NOTE    Subjective/Complaints: Had a spell yesterday where he was unresponsive briefly. No other issues reported after the event. Patient denies pain/distress this morning   Objective: Vital Signs: Blood pressure 129/78, pulse 56, temperature 98.3 F (36.8 C), temperature source Oral, resp. rate 18, weight 97.342 kg (214 lb 9.6 oz), SpO2 98 %. Dg Chest 2 View  03/24/2015   CLINICAL DATA:  Shortness of breath.  EXAM: CHEST  2 VIEW  COMPARISON:  CT 03/14/2015.  Chest x-ray 03/14/2015.  FINDINGS: Cardiomegaly with pulmonary vascular prominence and diffuse interstitial prominence noted consistent with congestive heart failure. Cardiac pacer with lead tips in right atrium right ventricle. Small left pleural effusion cannot be excluded. No pneumothorax. No acute bony abnormality  IMPRESSION: 1. Congestive heart failure pulmonary interstitial edema. Small left pleural effusion cannot be excluded . 2. Cardiac pacer with lead tips in right atrium right ventricle.   Electronically Signed   By: Maisie Fus  Register   On: 03/24/2015 13:43   Ct Head Wo Contrast  03/25/2015   CLINICAL DATA:  Weakness. Decreased are route stool. Recent decline and physical and cognitive abilities since the middle of last week.  EXAM: CT HEAD WITHOUT CONTRAST  TECHNIQUE: Contiguous axial images were obtained from the base of the skull through the vertex without intravenous contrast.  COMPARISON:  03/14/2015  FINDINGS: Diffuse cerebral atrophy. Mild ventricular dilatation consistent with central atrophy. Low-attenuation changes in the deep white matter consistent with small vessel ischemic change. Old lacunar infarcts seen in the thalamus on the right and basal ganglia on the left. No mass effect or midline shift. No abnormal extra-axial fluid collections. Gray-white matter junctions are distinct. Basal cisterns are not effaced. No evidence of acute intracranial hemorrhage. No  depressed skull fractures. Visualized paranasal sinuses and mastoid air cells are not opacified.  IMPRESSION: No acute intracranial abnormalities. Chronic atrophy and small vessel ischemic changes. Old lacunar infarcts. No change since prior study.   Electronically Signed   By: Burman Nieves M.D.   On: 03/25/2015 21:16    Recent Labs  03/25/15 1628  WBC 10.0  HGB 9.0*  HCT 29.3*  PLT 294    Recent Labs  03/24/15 0650 03/25/15 1628  NA 144 145  K 3.3* 3.8  CL 106 108  GLUCOSE 106* 111*  BUN 33* 28*  CREATININE 2.20* 2.34*  CALCIUM 8.3* 8.4   CBG (last 3)   Recent Labs  03/25/15 1314 03/25/15 1635 03/25/15 2135  GLUCAP 91 112* 94    Wt Readings from Last 3 Encounters:  03/26/15 97.342 kg (214 lb 9.6 oz)  03/14/15 95.5 kg (210 lb 8.6 oz)  03/13/15 92.08 kg (203 lb)    Physical Exam:  Constitutional: He is oriented to person, place, and time. He appears well-developed.  HENT: PERRL. Dentition fair Head: Normocephalic.  Eyes: EOM are normal.  Neck: Normal range of motion. Neck supple. No thyromegaly present.  Cardiovascular:  Cardiac rate controlled without murmur. No edema appreciated Respiratory:  Lungs decreased breath sounds at the bases. A few rhonchi GI: Soft. Bowel sounds are normal. He exhibits no distension.  Neurological: He is alert and oriented to person, place, and time.  Follow simple commands. Moves all 4's.3-4/5 UE's and 2+ hf, 3 ke and 4/5 ankles  Skin: Skin is warm and dry.  Psychiatric: He has a normal mood and affect. His behavior is normal. Thought content normal   Assessment/Plan: 1. Functional  deficits secondary to debility and gait disorder which require 3+ hours per day of interdisciplinary therapy in a comprehensive inpatient rehab setting. Physiatrist is providing close team supervision and 24 hour management of active medical problems listed below. Physiatrist and rehab team continue to assess barriers to discharge/monitor  patient progress toward functional and medical goals.  Meeting with family this morning.   FIM: FIM - Bathing Bathing Steps Patient Completed: Chest, Right Arm, Left Arm, Abdomen, Front perineal area, Right upper leg, Left upper leg, Buttocks, Right lower leg (including foot), Left lower leg (including foot) Bathing: 5: Supervision: Safety issues/verbal cues  FIM - Upper Body Dressing/Undressing Upper body dressing/undressing steps patient completed: Thread/unthread left sleeve of pullover shirt/dress, Put head through opening of pull over shirt/dress, Pull shirt over trunk, Thread/unthread right sleeve of pullover shirt/dresss Upper body dressing/undressing: 5: Supervision: Safety issues/verbal cues FIM - Lower Body Dressing/Undressing Lower body dressing/undressing steps patient completed: Thread/unthread right pants leg, Thread/unthread left pants leg, Pull pants up/down, Don/Doff right sock, Don/Doff left sock, Thread/unthread right underwear leg, Thread/unthread left underwear leg, Pull underwear up/down, Fasten/unfasten pants Lower body dressing/undressing: 4: Steadying Assist  FIM - Toileting Toileting steps completed by patient: Adjust clothing prior to toileting, Performs perineal hygiene, Adjust clothing after toileting Toileting Assistive Devices: Grab bar or rail for support Toileting: 4: Steadying assist  FIM - Diplomatic Services operational officer Devices: Environmental consultant, Therapist, music Transfers: 4-To toilet/BSC: Min A (steadying Pt. > 75%), 5-From toilet/BSC: Supervision (verbal cues/safety issues)  FIM - Banker Devices: Manufacturing systems engineer Transfer: 3: Supine > Sit: Mod A (lifting assist/Pt. 50-74%/lift 2 legs, 5: Sit > Supine: Supervision (verbal cues/safety issues)  FIM - Locomotion: Wheelchair Distance: 75 Locomotion: Wheelchair: 1: Total Assistance/staff pushes wheelchair (Pt<25%) FIM - Locomotion: Ambulation Locomotion:  Ambulation Assistive Devices: Designer, industrial/product Ambulation/Gait Assistance: 4: Min assist Locomotion: Ambulation: 0: Activity did not occur  Comprehension Comprehension Mode: Auditory Comprehension: 3-Understands basic 50 - 74% of the time/requires cueing 25 - 50%  of the time  Expression Expression Mode: Verbal Expression: 3-Expresses basic 50 - 74% of the time/requires cueing 25 - 50% of the time. Needs to repeat parts of sentences.  Social Interaction Social Interaction: 2-Interacts appropriately 25 - 49% of time - Needs frequent redirection.  Problem Solving Problem Solving: 2-Solves basic 25 - 49% of the time - needs direction more than half the time to initiate, plan or complete simple activities  Memory Memory: 3-Recognizes or recalls 50 - 74% of the time/requires cueing 25 - 49% of the time  Medical Problem List and Plan: 1. Functional deficits secondary to Debility /HCAP/Gait disorder 2. DVT Prophylaxis/Anticoagulation: Chronic coumadin/A fib. 3. Pain Management: Hydrocodone as needed. 4. Mood: Lexapro 5 mg daily 5. Neuropsych: This patient is capable of making decisions on her own behalf.  -episode yesterday, ?bp related. CT without acute findings--does have significant atrophy. No indications of a seizure 6. Skin/Wound Care: Routine skin checks 7. Fluids/Electrolytes/Nutrition: replace K+ 8.ID/HCAP. Augmentin 500 mg 3 times a day until 03/23/2015.  -wbc's stable to decreased)  -IS, FV, OOB 9.LBBB./non ischemic cardiomyopathy.Follow up cardiology on defibrillator in reference to being unable to place LV lead due to no appropriate veins and plan follow-up  -net I/O's negative, cxr with interstitial edema  -appreciate cards help  -torsemide increased to  bid  -weights daily  -checking orthostatic vs 10.CRI.Baseline Creatinine 2.28-2.75 at baseline 11. Hypertension. Norvasc 10 mg daily, Coreg 12.5 mg twice a day, hydralazine 100 mg 3  times a day, Imdur 90 mg twice  a day, Demadex 10 mg daily. Monitor with increased mobility 12. GERD versus esophageal dysmotility Dysphagia 2 thin liquid diet. Advance diet as tolerated 13. Hypothyroidism. Synthroid 14. Hyperlipidemia. Lipitor 15. Acute on chronic anemia. Continue iron supplement. Follow-up hgb 8.8 16. COPD. Symbicort,  mucinex bid 17. Diabetes: resumed glipizide 2.5mg  bid, SSI   LOS (Days) 9 A FACE TO FACE EVALUATION WAS PERFORMED  SWARTZ,ZACHARY T 03/26/2015 7:33 AM

## 2015-03-26 NOTE — Progress Notes (Signed)
Physical Therapy Session Note  Patient Details  Name: Frank Mclean MRN: [id number] Date of Birth: Aug 18, 1941  Today's Date: 03/26/2015 PT Individual Time: 0900-1000 PT Individual Time Calculation (min): 60 min   Short Term Goals: Week 2:  PT Short Term Goal 1 (Week 2): = LTGs  Skilled Therapeutic Interventions/Progress Updates:    Pt received supine in bed, agreeable to participate in therapy after mod encouragement. Session focused on sit<>stands, sustained attention, w/c propulsion, general strengthening/endurance. Pt transferred supine>sit w/ ModA and bed>w/c w/ stand pivot technique w/ MaxA. Pt noted to be very lethargic with decreased intelligibility, per SLP this is same or worse than yesterday. Instructed pt in propelling w/c 100' w/ BUE/BLE w/ MinA for navigating environment due to decreased attention to RUE leading to difficulty navigating obstacles on R. Pt ambulated 10' w/c>Nustep w/ RW and MaxA . Pt tolerated 10' on Nustep L3 w/ UE/LE for sustained attention and general cardiovascular endurance w/ intermittent seated rest breaks. Pt ambulated 25' back to w/c w/ ModA w/ RW. Instructed pt in ascending/descending 3 stairs w/ 2 rails and ModA with facilitation for knee extension during ascending and upright posture during descending. Session ended in pt's room, where pt was left handed off to OT w/ all needs within reach.    Therapy Documentation Precautions:  Precautions Precautions: Fall, ICD/Pacemaker Precaution Comments: will need sx again to complete pacemaker placement Required Braces or Orthoses: Sling Restrictions Weight Bearing Restrictions: No Other Position/Activity Restrictions: L arm in sling Pain: Pain Assessment Pain Assessment: No/denies pain  See FIM for current functional status  Therapy/Group: Individual Therapy  Hosie Spangle  Hosie Spangle, PT, DPT 03/26/2015, 7:40 AM

## 2015-03-26 NOTE — Progress Notes (Signed)
Occupational Therapy Note  Patient Details  Name: Frank Mclean MRN: [id number] Date of Birth: 07-Oct-1941  Today's Date: 03/26/2015 OT Individual Time: 1300-1330 (Make up Session) OT Individual Time Calculation (min): 30 min   Pt denied pain Individual therapy (Make Up Session)  Pt resting in bed upon arrival and required min encouragement to participate.  Pt engaged in dynamic sitting tasks and bed mobility.  Pt also performed sit<>stand with min A.  Pt performed all sitting tasks with close supervision.  Focus on activity tolerance, bed mobility, sit<>stand, dynamic sitting balance, and safety awareness.   Lavone Neri [facility] 03/26/2015, 1:32 PM

## 2015-03-26 NOTE — Progress Notes (Signed)
ANTICOAGULATION CONSULT NOTE - Follow Up Consult  Pharmacy Consult for coumadin Indication: atrial fibrillation  Allergies  Allergen Reactions  . Levothyroxine Sodium Other (See Comments)    MUST TAKE BRAND NAME GENERIC DOESN'T WORK FOR PATIENT  . Chicken Protein     Does not like chicken or [place]  . Eggs Or Egg-Derived Products     Does not like eggs    Patient Measurements: Weight: 214 lb 9.6 oz (97.342 kg) Heparin Dosing Weight:   Vital Signs: Temp: 98.3 F (36.8 C) (04/06 0500) Temp Source: Oral (04/06 0500) BP: 129/78 mmHg (04/05 2218)  Labs:  Recent Labs  03/24/15 0650 03/25/15 1628  HGB  --  9.0*  HCT  --  29.3*  PLT  --  294  LABPROT 26.2*  --   INR 2.38*  --   CREATININE 2.20* 2.34*    Estimated Creatinine Clearance: 31.7 mL/min (by C-G formula based on Cr of 2.34).   Medications:  Scheduled:  . amLODipine  10 mg Oral Daily  . atorvastatin  20 mg Oral Daily  . budesonide-formoterol  2 puff Inhalation BID  . carvedilol  12.5 mg Oral BID WC  . cholecalciferol  1,000 Units Oral Daily  . escitalopram  5 mg Oral QHS  . feeding supplement (GLUCERNA SHAKE)  237 mL Oral BID BM  . ferrous sulfate  325 mg Oral TID WC  . glipiZIDE  2.5 mg Oral BID AC  . guaiFENesin  600 mg Oral BID  . hydrALAZINE  100 mg Oral TID  . insulin aspart  0-9 Units Subcutaneous TID WC  . isosorbide mononitrate  90 mg Oral BID  . levothyroxine  200 mcg Oral QAC breakfast  . multivitamin with minerals  1 tablet Oral Daily  . nystatin  5 mL Oral QID  . pantoprazole  40 mg Oral BID  . polyethylene glycol  17 g Oral Daily  . potassium chloride  20 mEq Oral Daily  . torsemide  10 mg Oral Daily  . torsemide  20 mg Oral Once  . cyanocobalamin  1,000 mcg Oral Daily  . warfarin  2.5 mg Oral Once per day on Mon Fri  . warfarin  5 mg Oral Once per day on Sun Tue Wed Thu Sat  . Warfarin - Pharmacist Dosing Inpatient   Does not apply q1800   Infusions:    Assessment: 74 yo male  with afib is currently on therapeutic coumadin.  INR today is 2.39. Goal of Therapy:  INR 2-3 Monitor platelets by anticoagulation protocol: Yes   Plan:  Coumadin 5mg  daily except 2.5mg  Mon/Fri. INR M/W/F  Vonzella Althaus, Tsz-Yin 03/26/2015,8:15 AM

## 2015-03-26 NOTE — Progress Notes (Signed)
Speech Language Pathology Weekly Progress Note  Patient Details  Name: Frank Mclean MRN: [id number] Date of Birth: 1941/04/01  Beginning of progress report period: March 19, 2015 End of progress report period: March 26, 2015  Short Term Goals: Week 1: SLP Short Term Goal 1 (Week 1): short term goals = long term goals  New Short Term Goals: Week 2: SLP Short Term Goal 1 (Week 2): short term goals = long term goals  Weekly Progress Updates: Patient has made limited functional gains this reporting period.  Patient has progressed from a Dys.2 texture diet to a Dys.3 texture diet but due to fluctuating cognition he now requires full supervision to ensure overall safety.  SLP had initially, predicted a 1 week length of time for addressing dysphagia goals; however, given yesterday's performance patient continues to require skilled SLP services.  Additionally, patient and family education is ongoing. Patient would benefit from continued skilled SLP intervention to maximize swallow safety in order to maximize his functional independence prior to discharge home with 24/7 supervision.    Intensity: Minumum of 1-2 x/day, 30 to 90 minutes Frequency: 1 to 3 out of 7 days Duration/Length of Stay: 4/13 Treatment/Interventions: Cueing hierarchy;Dysphagia/aspiration precaution training;Functional tasks;Environmental controls;Internal/external aids;Patient/family education  Charlane Ferretti., CCC-SLP [phone]  Frank Mclean 03/26/2015, 11:42 AM
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
## 2015-03-26 NOTE — Progress Notes (Signed)
Social Work Patient ID: Frank Mclean, male   DOB: Jan 11, 1941, 74 y.o.   MRN: [id number]   CSW met with pt and talked with his brother-in-law, Erlene Quan, via telephone on 03-25-15 to update them on team conference discussion.  Erlene Quan was pleased to learn that pt's stay would be extended by a few days (04-01-15), as they want pt to get as much rehab before going home as possible.  CSW also shared with Erlene Quan that Dr. Naaman Plummer would meet with the family at 7:45am on 03-25-14.  He stated that they would be in pt's room to meet with him.  Also, family conference with PA, OT, PT, ST, and SW will occur on 03-27-15 for 30 minutes, time to be determined this afternoon at scheduling and CSW will alert family of time.  No other concerns/questions/needs at this time.  CSW will continue to follow and assist as needed.

## 2015-03-26 NOTE — Progress Notes (Signed)
Occupational Therapy Session Note  Patient Details  Name: Frank Mclean MRN: [id number] Date of Birth: 12-02-1941  Today's Date: 03/26/2015 OT Individual Time: 1000-1100 OT Individual Time Calculation (min): 60 min    Short Term Goals: Week 2:  OT Short Term Goal 1 (Week 2): STG= LTG due to estimated LOS  Skilled Therapeutic Interventions/Progress Updates:    Pt seen for OT session focusing on activity tolerance and standing balance. Pt in w/c upon arrival, finishing with PT and pt tearful but agreeable to tx. Suggestion made by therapist for pt to go outside for emotional support, pt voiced he believed that would make him feel better. Pt self-propelled w/c throughout unit with supervision and cues, navigating w/c onto/off of elevator. Pt taken to solarium in hospital. Pt declined walking once in solarium stating he felt slightly dizzy. Pt taken outside for several minutes and voiced feeling better afterwards. Pt then returned to unit, and taken to therapy gym. Pt transferred w/c> therapy mat with min A via stand pivot transfer. Pt completed cognitive task of pipe tree in standing. Pt stood for 3 trials of ~2-3 minutes each with initial min A with cues for upright posture, when fatigued required mod A for standing. Pt able to complete 3 pipe tree structures with min VCs for problem solving during session. Pt transferred mat> w/c via stand pivot at RW with min A. Pt taken back to room and requested return to bed. Pt transferred w/c> EOB with min A, and required assist for B LEs to get into bed. Pt left in bed, all needs in reach.   Therapy Documentation Precautions:  Precautions Precautions: Fall, ICD/Pacemaker Precaution Comments: will need sx again to complete pacemaker placement Required Braces or Orthoses: Sling Restrictions Weight Bearing Restrictions: No Other Position/Activity Restrictions: L arm in sling Pain: Pain Assessment Pain Assessment: No/denies pain  See FIM for current  functional status  Therapy/Group: Individual Therapy  [name], Dotsie Gillette C 03/26/2015, 12:10 PM

## 2015-03-26 NOTE — Progress Notes (Addendum)
Patient Name: Frank Mclean Date of Encounter: 03/26/2015  Principal Problem:   Debility Active Problems:   DM (diabetes mellitus), type 2 with renal complications   CKD (chronic kidney disease) stage 3, GFR 30-59 ml/min   COPD with bronchitis   Gait disorder   HCAP (healthcare-associated pneumonia)   Acute on chronic systolic CHF (congestive heart failure), NYHA class 4   Length of Stay: 9  SUBJECTIVE  Examined just after PT session. Did not appear dyspneic climbing steps. Had been a little dizzy earlier during bicycle exercises. A little emotional.  CURRENT MEDS . amLODipine  10 mg Oral Daily  . atorvastatin  20 mg Oral Daily  . budesonide-formoterol  2 puff Inhalation BID  . carvedilol  12.5 mg Oral BID WC  . cholecalciferol  1,000 Units Oral Daily  . escitalopram  5 mg Oral QHS  . feeding supplement (GLUCERNA SHAKE)  237 mL Oral BID BM  . ferrous sulfate  325 mg Oral TID WC  . glipiZIDE  2.5 mg Oral BID AC  . guaiFENesin  600 mg Oral BID  . hydrALAZINE  100 mg Oral TID  . insulin aspart  0-9 Units Subcutaneous TID WC  . isosorbide mononitrate  90 mg Oral BID  . levothyroxine  200 mcg Oral QAC breakfast  . multivitamin with minerals  1 tablet Oral Daily  . nystatin  5 mL Oral QID  . pantoprazole  40 mg Oral BID  . polyethylene glycol  17 g Oral Daily  . potassium chloride  20 mEq Oral Daily  . torsemide  10 mg Oral Daily  . torsemide  20 mg Oral Once  . cyanocobalamin  1,000 mcg Oral Daily  . warfarin  2.5 mg Oral Once per day on Mon Fri  . warfarin  5 mg Oral Once per day on Sun Tue Wed Thu Sat  . Warfarin - Pharmacist Dosing Inpatient   Does not apply q1800    OBJECTIVE   Intake/Output Summary (Last 24 hours) at 03/26/15 1011 Last data filed at 03/26/15 0800  Gross per 24 hour  Intake    170 ml  Output    200 ml  Net    -30 ml   Filed Weights   03/24/15 0910 03/25/15 0500 03/26/15 0456  Weight: 224 lb 13.9 oz (102 kg) 223 lb 9.6 oz (101.424 kg) 214 lb  9.6 oz (97.342 kg)    PHYSICAL EXAM Filed Vitals:   03/25/15 2218 03/26/15 0456 03/26/15 0500 03/26/15 0711  BP: 129/78     Pulse:      Temp:   98.3 F (36.8 C)   TempSrc:   Oral   Resp:      Weight:  214 lb 9.6 oz (97.342 kg)    SpO2:   100% 98%   General: Alert, oriented x3, no distress Head: no evidence of trauma, PERRL, EOMI, no exophtalmos or lid lag, no myxedema, no xanthelasma; normal ears, nose and oropharynx Neck: normal jugular venous pulsations and no hepatojugular reflux; brisk carotid pulses without delay and no carotid bruits Chest: clear to auscultation, no signs of consolidation by percussion or palpation, normal fremitus, symmetrical and full respiratory excursions Cardiovascular: normal position and quality of the apical impulse, regular rhythm, normal first and second heart sounds, no rubs or gallops, no murmur Abdomen: no tenderness or distention, no masses by palpation, no abnormal pulsatility or arterial bruits, normal bowel sounds, no hepatosplenomegaly Extremities: no clubbing, cyanosis or edema; 2+ radial, ulnar and brachial pulses  bilaterally; 2+ right femoral, posterior tibial and dorsalis pedis pulses; 2+ left femoral, posterior tibial and dorsalis pedis pulses; no subclavian or femoral bruits Neurological: grossly nonfocal  LABS  CBC  Recent Labs  03/25/15 1628  WBC 10.0  HGB 9.0*  HCT 29.3*  MCV 83.2  PLT 294   Basic Metabolic Panel  Recent Labs  03/24/15 0650 03/25/15 1628  NA 144 145  K 3.3* 3.8  CL 106 108  CO2 29 28  GLUCOSE 106* 111*  BUN 33* 28*  CREATININE 2.20* 2.34*  CALCIUM 8.3* 8.4   Liver Function Tests No results for input(s): AST, ALT, ALKPHOS, BILITOT, PROT, ALBUMIN in the last 72 hours. No results for input(s): LIPASE, AMYLASE in the last 72 hours. Cardiac Enzymes No results for input(s): CKTOTAL, CKMB, CKMBINDEX, TROPONINI in the last 72 hours. BNP Invalid input(s): POCBNP D-Dimer No results for input(s):  DDIMER in the last 72 hours. Hemoglobin A1C No results for input(s): HGBA1C in the last 72 hours. Fasting Lipid Panel No results for input(s): CHOL, HDL, LDLCALC, TRIG, CHOLHDL, LDLDIRECT in the last 72 hours. Thyroid Function Tests  Recent Labs  03/26/15 0655  TSH 1.458    Radiology Studies Imaging results have been reviewed and Dg Chest 2 View  03/24/2015   CLINICAL DATA:  Shortness of breath.  EXAM: CHEST  2 VIEW  COMPARISON:  CT 03/14/2015.  Chest x-ray 03/14/2015.  FINDINGS: Cardiomegaly with pulmonary vascular prominence and diffuse interstitial prominence noted consistent with congestive heart failure. Cardiac pacer with lead tips in right atrium right ventricle. Small left pleural effusion cannot be excluded. No pneumothorax. No acute bony abnormality  IMPRESSION: 1. Congestive heart failure pulmonary interstitial edema. Small left pleural effusion cannot be excluded . 2. Cardiac pacer with lead tips in right atrium right ventricle.   Electronically Signed   By: Maisie Fus  Register   On: 03/24/2015 13:43   Ct Head Wo Contrast  03/25/2015   CLINICAL DATA:  Weakness. Decreased are route stool. Recent decline and physical and cognitive abilities since the middle of last week.  EXAM: CT HEAD WITHOUT CONTRAST  TECHNIQUE: Contiguous axial images were obtained from the base of the skull through the vertex without intravenous contrast.  COMPARISON:  03/14/2015  FINDINGS: Diffuse cerebral atrophy. Mild ventricular dilatation consistent with central atrophy. Low-attenuation changes in the deep white matter consistent with small vessel ischemic change. Old lacunar infarcts seen in the thalamus on the right and basal ganglia on the left. No mass effect or midline shift. No abnormal extra-axial fluid collections. Gray-white matter junctions are distinct. Basal cisterns are not effaced. No evidence of acute intracranial hemorrhage. No depressed skull fractures. Visualized paranasal sinuses and mastoid air  cells are not opacified.  IMPRESSION: No acute intracranial abnormalities. Chronic atrophy and small vessel ischemic changes. Old lacunar infarcts. No change since prior study.   Electronically Signed   By: Burman Nieves M.D.   On: 03/25/2015 21:16     ECG NSR, LBBB, LAD  ASSESSMENT AND PLAN   CHF appears to now be compensated LVEF 30-35%, s/p CRT BP in desirable range Renal function at baseline, K is OK Keep on current diuretic dose    Thurmon Fair, MD, [facility] [phone] office [phone] pager 03/26/2015 10:11 AM

## 2015-03-27 ENCOUNTER — Inpatient Hospital Stay (HOSPITAL_COMMUNITY): Payer: Medicare Other

## 2015-03-27 ENCOUNTER — Inpatient Hospital Stay (HOSPITAL_COMMUNITY): Payer: Medicare Other | Admitting: Occupational Therapy

## 2015-03-27 ENCOUNTER — Inpatient Hospital Stay: Payer: Medicare Other | Admitting: Internal Medicine

## 2015-03-27 ENCOUNTER — Other Ambulatory Visit: Payer: Medicare Other

## 2015-03-27 ENCOUNTER — Inpatient Hospital Stay (HOSPITAL_COMMUNITY): Payer: Medicare Other | Admitting: Speech Pathology

## 2015-03-27 LAB — BASIC METABOLIC PANEL
Anion gap: 8 (ref 5–15)
BUN: 18 mg/dL (ref 6–23)
CO2: 27 mmol/L (ref 19–32)
CREATININE: 2.05 mg/dL — AB (ref 0.50–1.35)
Calcium: 8.4 mg/dL (ref 8.4–10.5)
Chloride: 110 mmol/L (ref 96–112)
GFR calc non Af Amer: 30 mL/min — ABNORMAL LOW (ref >90)
GFR, EST AFRICAN AMERICAN: 35 mL/min — AB (ref >90)
Glucose, Bld: 77 mg/dL (ref 70–99)
POTASSIUM: 3.3 mmol/L — AB (ref 3.5–5.1)
SODIUM: 145 mmol/L (ref 135–145)

## 2015-03-27 LAB — GLUCOSE, CAPILLARY
GLUCOSE-CAPILLARY: 78 mg/dL (ref 70–99)
GLUCOSE-CAPILLARY: 111 mg/dL — AB (ref 70–99)
GLUCOSE-CAPILLARY: 96 mg/dL (ref 70–99)
GLUCOSE-CAPILLARY: 81 mg/dL (ref 70–99)

## 2015-03-27 LAB — URINE CULTURE
COLONY COUNT: NO GROWTH
CULTURE: NO GROWTH

## 2015-03-27 LAB — CBC
HCT: 28.9 % — ABNORMAL LOW (ref 39.0–52.0)
Hemoglobin: 8.9 g/dL — ABNORMAL LOW (ref 13.0–17.0)
MCH: 25.9 pg — AB (ref 26.0–34.0)
MCHC: 30.8 g/dL (ref 30.0–36.0)
MCV: 84 fL (ref 78.0–100.0)
PLATELETS: 289 10*3/uL (ref 150–400)
RBC: 3.44 MIL/uL — AB (ref 4.22–5.81)
RDW: 16.1 % — AB (ref 11.5–15.5)
WBC: 8.6 10*3/uL (ref 4.0–10.5)

## 2015-03-27 MED ORDER — POTASSIUM CHLORIDE CRYS ER 20 MEQ PO TBCR
20.0000 meq | EXTENDED_RELEASE_TABLET | Freq: Once | ORAL | Status: AC
  Administered 2015-03-27: 20 meq via ORAL
  Filled 2015-03-27: qty 1

## 2015-03-27 NOTE — Progress Notes (Signed)
[place] PHYSICAL MEDICINE & REHABILITATION     PROGRESS NOTE    Subjective/Complaints: Had a good night. Denies sob, cp, cough. Denies pain   Objective: Vital Signs: Blood pressure 124/68, pulse 60, temperature 98.3 F (36.8 C), temperature source Oral, resp. rate 17, weight 97.342 kg (214 lb 9.6 oz), SpO2 98 %. Ct Head Wo Contrast  03/25/2015   CLINICAL DATA:  Weakness. Decreased are route stool. Recent decline and physical and cognitive abilities since the middle of last week.  EXAM: CT HEAD WITHOUT CONTRAST  TECHNIQUE: Contiguous axial images were obtained from the base of the skull through the vertex without intravenous contrast.  COMPARISON:  03/14/2015  FINDINGS: Diffuse cerebral atrophy. Mild ventricular dilatation consistent with central atrophy. Low-attenuation changes in the deep white matter consistent with small vessel ischemic change. Old lacunar infarcts seen in the thalamus on the right and basal ganglia on the left. No mass effect or midline shift. No abnormal extra-axial fluid collections. Gray-white matter junctions are distinct. Basal cisterns are not effaced. No evidence of acute intracranial hemorrhage. No depressed skull fractures. Visualized paranasal sinuses and mastoid air cells are not opacified.  IMPRESSION: No acute intracranial abnormalities. Chronic atrophy and small vessel ischemic changes. Old lacunar infarcts. No change since prior study.   Electronically Signed   By: Burman Nieves M.D.   On: 03/25/2015 21:16    Recent Labs  03/25/15 1628 03/27/15 0624  WBC 10.0 8.6  HGB 9.0* 8.9*  HCT 29.3* 28.9*  PLT 294 289    Recent Labs  03/25/15 1628 03/27/15 0624  NA 145 145  K 3.8 3.3*  CL 108 110  GLUCOSE 111* 77  BUN 28* 18  CREATININE 2.34* 2.05*  CALCIUM 8.4 8.4   CBG (last 3)   Recent Labs  03/26/15 1649 03/26/15 2105 03/27/15 0630  GLUCAP 80 122* 78    Wt Readings from Last 3 Encounters:  03/26/15 97.342 kg (214 lb 9.6 oz)   03/14/15 95.5 kg (210 lb 8.6 oz)  03/13/15 92.08 kg (203 lb)    Physical Exam:  Constitutional: He is oriented to person, place, and time. He appears well-developed.  HENT: PERRL. Dentition fair Head: Normocephalic.  Eyes: EOM are normal.  Neck: Normal range of motion. Neck supple. No thyromegaly present.  Cardiovascular:  Cardiac rate controlled without murmur. No edema appreciated Respiratory:  Lungs decreased breath sounds at the bases. A few rhonchi GI: Soft. Bowel sounds are normal. He exhibits no distension.  Neurological: He is alert and oriented to person, place, and time.  Follow simple commands. Moves all 4's.3-4/5 UE's and 2+ hf, 3 ke and 4/5 ankles  Skin: Skin is warm and dry.  Psychiatric: He has a normal mood and affect. His behavior is normal. Thought content normal   Assessment/Plan: 1. Functional deficits secondary to debility and gait disorder which require 3+ hours per day of interdisciplinary therapy in a comprehensive inpatient rehab setting. Physiatrist is providing close team supervision and 24 hour management of active medical problems listed below. Physiatrist and rehab team continue to assess barriers to discharge/monitor patient progress toward functional and medical goals.     FIM: FIM - Bathing Bathing Steps Patient Completed: Chest, Right Arm, Left Arm, Abdomen, Front perineal area, Right upper leg, Left upper leg, Buttocks, Right lower leg (including foot), Left lower leg (including foot) Bathing: 5: Supervision: Safety issues/verbal cues  FIM - Upper Body Dressing/Undressing Upper body dressing/undressing steps patient completed: Thread/unthread left sleeve of pullover shirt/dress, Put head  through opening of pull over shirt/dress, Pull shirt over trunk, Thread/unthread right sleeve of pullover shirt/dresss Upper body dressing/undressing: 5: Supervision: Safety issues/verbal cues FIM - Lower Body Dressing/Undressing Lower body  dressing/undressing steps patient completed: Don/Doff left sock, Don/Doff right sock Lower body dressing/undressing: 2: Max-Patient completed 25-49% of tasks  FIM - Toileting Toileting steps completed by patient: Adjust clothing prior to toileting, Performs perineal hygiene, Adjust clothing after toileting Toileting Assistive Devices: Grab bar or rail for support Toileting: 4: Steadying assist  FIM - Diplomatic Services operational officer Devices: Environmental consultant, Therapist, music Transfers: 4-To toilet/BSC: Min A (steadying Pt. > 75%), 5-From toilet/BSC: Supervision (verbal cues/safety issues)  FIM - Banker Devices: Therapist, occupational: 4: Supine > Sit: Min A (steadying Pt. > 75%/lift 1 leg), 3: Sit > Supine: Mod A (lifting assist/Pt. 50-74%/lift 2 legs)  FIM - Locomotion: Wheelchair Distance: 75 Locomotion: Wheelchair: 2: Travels 50 - 149 ft with minimal assistance (Pt.>75%) FIM - Locomotion: Ambulation Locomotion: Ambulation Assistive Devices: Designer, industrial/product Ambulation/Gait Assistance: 3: Mod assist Locomotion: Ambulation: 1: Travels less than 50 ft with moderate assistance (Pt: 50 - 74%)  Comprehension Comprehension Mode: Auditory Comprehension: 3-Understands basic 50 - 74% of the time/requires cueing 25 - 50%  of the time  Expression Expression Mode: Verbal Expression: 3-Expresses basic 50 - 74% of the time/requires cueing 25 - 50% of the time. Needs to repeat parts of sentences.  Social Interaction Social Interaction: 2-Interacts appropriately 25 - 49% of time - Needs frequent redirection.  Problem Solving Problem Solving: 2-Solves basic 25 - 49% of the time - needs direction more than half the time to initiate, plan or complete simple activities  Memory Memory: 3-Recognizes or recalls 50 - 74% of the time/requires cueing 25 - 49% of the time  Medical Problem List and Plan: 1. Functional deficits secondary to Debility  /HCAP/Gait disorder 2. DVT Prophylaxis/Anticoagulation: Chronic coumadin/A fib. 3. Pain Management: Hydrocodone as needed. 4. Mood: Lexapro 5 mg daily 5. Neuropsych: This patient is capable of making decisions on her own behalf.  -episode yesterday, ?bp related. CT without acute findings--does have significant atrophy. No indications of a seizure 6. Skin/Wound Care: Routine skin checks 7. Fluids/Electrolytes/Nutrition: replace K+ 8.ID/HCAP. Augmentin 500 mg 3 times a day until 03/23/2015.  -wbc's stable to decreased)  -IS, FV, OOB  -UA equivocal, culture pending, remains afebrile 9.LBBB./non ischemic cardiomyopathy.Follow up cardiology on defibrillator in reference to being unable to place LV lead due to no appropriate veins and plan follow-up  -net I/O's negative, cxr with interstitial edema  -appreciate cards help  -torsemide increased to 10mg  bid  -weights daily  -checking orthostatic VS 10.CRI.Baseline Creatinine 2.28-2.75 at baseline 11. Hypertension. Norvasc 10 mg daily, Coreg 12.5 mg twice a day, hydralazine 100 mg 3 times a day, Imdur 90 mg twice a day, Demadex 10 mg daily. Monitor with increased mobility 12. GERD versus esophageal dysmotility Dysphagia 2 thin liquid diet. Advance diet as tolerated 13. Hypothyroidism. Synthroid 14. Hyperlipidemia. Lipitor 15. Acute on chronic anemia. Continue iron supplement. Follow-up hgb 8.8 16. COPD. Symbicort,  mucinex bid 17. Diabetes: resumed glipizide 2.5mg  bid, SSI  -control   LOS (Days) 10 A FACE TO FACE EVALUATION WAS PERFORMED  Myrissa Chipley T 03/27/2015 9:24 AM

## 2015-03-27 NOTE — Progress Notes (Signed)
Speech Language Pathology Daily Session Note  Patient Details  Name: Frank Mclean MRN: [id number] Date of Birth: 1941/07/31  Today's Date: 03/27/2015 SLP Individual Time: 1130-1220 SLP Individual Time Calculation (min): 50 min (30 minutes scheduled and 20 minutes make-up)  Short Term Goals: Week 2: SLP Short Term Goal 1 (Week 2): short term goals = long term goals  Skilled Therapeutic Interventions: Skilled treatment session focused on dysphagia goals; patient seen for additional 20 minute make-up session. Upon arrival, patient was awake in bed and agreeable to participate in therapy. Patient transferred to w/c in preparation for consumption of lunchtime meal of Dys. 3 textures and thin liquids. Student facilitated session through Mod A visual and verbal cues for recall of swallowing strategies and skilled observation of lunchtime meal, which patient consumed with extra time, Supervision A verbal cues for functional problem-solving relating to tray set-up, and Mod A fading to Min A verbal and visual cues for use of swallowing strategies.  Patient with minimal overt s/s of aspiration characterized by immediate cough x1 following rapid rate of feeding with mixed consistency bolus and with wet vocal quality x1 following rapid rate of feeding, which cleared with cued throat clear. Patient left upright in w/c with all needs within reach. Continue with current plan of care.   FIM:  Comprehension Comprehension Mode: Auditory Comprehension: 5-Understands basic 90% of the time/requires cueing < 10% of the time Expression Expression Mode: Verbal Expression: 5-Expresses basic 90% of the time/requires cueing < 10% of the time. Social Interaction Social Interaction: 5-Interacts appropriately 90% of the time - Needs monitoring or encouragement for participation or interaction. Problem Solving Problem Solving: 4-Solves basic 75 - 89% of the time/requires cueing 10 - 24% of the time Memory Memory:  3-Recognizes or recalls 50 - 74% of the time/requires cueing 25 - 49% of the time FIM - Eating Eating Activity: 6: Modified consistency diet: (comment);6: Assistive device: dentures;6: More than reasonable amount of time;5: Supervision/cues;5: Needs verbal cues/supervision  Pain Pain Assessment Pain Assessment: No/denies pain  Therapy/Group: Individual Therapy  Tacey Ruiz 03/27/2015, 12:50 PM

## 2015-03-27 NOTE — Progress Notes (Signed)
Social Work Patient ID: Frank Mclean, male   DOB: [birth year], 74 y.o.   MRN: [id number]   Family conference held this morning with pt's wife, brother-in-law, Bryson Ha (PT), Amy (OT), and Loma Sousa (Speech), along with CSW.  Family met with Dr. Naaman Plummer and Linna Hoff (PA) yesterday to have questions answered.  Outcome of the meeting including learning family's goals for pt to be able to return home and to let family know of pt's fluctuating functional status.  It was decided to do family education tomorrow and see if family can meet pt's needs.  They feel that pt would do better at home and they are committed to taking pt home if he can get to the bathroom.  CSW will continue to follow and assist with d/c planning.

## 2015-03-27 NOTE — Plan of Care (Signed)
Problem: RH Toileting Goal: LTG Patient will perform toileting w/assist, cues/equip (OT) LTG: Patient will perform toiletiing (clothes management/hygiene) with assist, with/without cues using equipment (OT)  Modified 03/27/15- ACL  Problem: RH Toilet Transfers Goal: LTG Patient will perform toilet transfers w/assist (OT) LTG: Patient will perform toilet transfers with assist, with/without cues using equipment (OT)  Modified 03/27/15 ACL  Problem: RH Tub/Shower Transfers Goal: LTG Patient will perform tub/shower transfers w/assist (OT) LTG: Patient will perform tub/shower transfers with assist, with/without cues using equipment (OT)  Modified 03/27/15 ACL

## 2015-03-27 NOTE — Progress Notes (Signed)
Occupational Therapy Session Note  Patient Details  Name: Frank Mclean MRN: [id number] Date of Birth: 23-Mar-1941  Today's Date: 03/27/2015 OT Individual Time: 1300-1400 OT Individual Time Calculation (min): 60 min    Short Term Goals: Week 2:  OT Short Term Goal 1 (Week 2): STG= LTG due to estimated LOS  Skilled Therapeutic Interventions/Progress Updates:    Pt seen for ADL bathing and dressing session. Pt in w/c upon arrival, agreeable to tx. Pt voiced request for showering task. Pt completed shower transfer via stand pivot transfer from w/c. Pt completed showering task with supervision and completed buttock hygiene via lateral leans.  Pt transferred out of shower with mod A for sequencing and hand placement during transfer. Pt completed shaving task seated at the sink in w/c with VCs. Pt dressed UB with set-up. He required min A for clothing management when dressing LEs, steadying assist when standing at the sink to pull up pants. Pt able to don B socks with supervision.    Pt then taken to therapy day room in w/c. Pt stood at RW to water plants with CGA. Pt displayed functional standing dynamic balance, utilizing B UEs to complete task while standing.    Pt returned to room at end of session. Left in w/c with all needs in reach, RN made aware.   Education provided regarding home layout, DME, modified dressing techniques, and d/c planning.   Therapy Documentation Precautions:  Precautions Precautions: Fall, ICD/Pacemaker Precaution Comments: will need sx again to complete pacemaker placement Required Braces or Orthoses: Sling Restrictions Weight Bearing Restrictions: No Other Position/Activity Restrictions: L arm in sling Pain: Pain Assessment Pain Assessment: No/denies pain  See FIM for current functional status  Therapy/Group: Individual Therapy  [name], Lorrinda Ramstad C 03/27/2015, 1:31 PM

## 2015-03-27 NOTE — Progress Notes (Signed)
Physical Therapy Session Note  Patient Details  Name: Frank Mclean MRN: [id number] Date of Birth: December 26, 1940  Today's Date: 03/27/2015 PT Individual Time: 1430-1530 PT Individual Time Calculation (min): 60 min   Skilled Therapeutic Interventions/Progress Updates:   Session focused on functional transfers, sit to stands with RW (x 5 reps x 2 sets with focus on technique and cues), seated LE therex with 2# ankle weights for functional strengthening (marches and LAQ x 20 reps), and gait with RW back to room x 150' with min A with cues for upright posture and to maintain body inside RW for safety especially as fatigued. Pt requires multiple rest breaks due to fatigue during session. Returned back to bed end of session with all needs in place. Asked pt to recall what speech therapist recommended for meals and pt unable; re-educated pt on recommendation to OOB/upright for meals and that pt must be awake to eat.   Therapy Documentation Precautions:  Precautions Precautions: Fall, ICD/Pacemaker Precaution Comments: will need sx again to complete pacemaker placement Restrictions Weight Bearing Restrictions: No Pain: Pain Assessment Pain Assessment: No/denies pain Locomotion : Ambulation Ambulation/Gait Assistance: 4: Min assist   See FIM for current functional status  Therapy/Group: Individual Therapy  Karolee Stamps Darrol Poke, PT, DPT  03/27/2015, 4:02 PM

## 2015-03-27 NOTE — Progress Notes (Signed)
Physical Therapy Session Note  Patient Details  Name: Frank Mclean MRN: [id number] Date of Birth: [birth year]  Today's Date: 03/27/2015 PT Individual Time: 0900-1000 PT Individual Time Calculation (min): 60 min   Short Term Goals: Week 1:  PT Short Term Goal 1 (Week 1): Pt will be able to transfer with min A from various surface heights PT Short Term Goal 1 - Progress (Week 1): Partly met (Pt had met this until decline over last couple days) PT Short Term Goal 2 (Week 1): Pt will be able to gait x 150' with min A PT Short Term Goal 2 - Progress (Week 1): Partly met (Pt had met this until decline over last couple days) PT Short Term Goal 3 (Week 1): Pt will be able to do 5 stairs with 1 rail with mod A PT Short Term Goal 3 - Progress (Week 1): Partly met (Pt had met this until decline over last couple days) PT Short Term Goal 4 (Week 1): Pt will demonstrate dynamic standing balance during functional task with min A PT Short Term Goal 4 - Progress (Week 1): Partly met (Pt had met this until decline over last couple days) Week 2:  PT Short Term Goal 1 (Week 2): = LTGs  Skilled Therapeutic Interventions/Progress Updates:   Pt asleep but able to be aroused upon therapist's arrival with pt stating he was trying to wake up. Seated EOB addressed dynamic sitting balance and functional task to don socks and pants (therapist donned [place] with total A). Pt able to don socks with S and required min A for pants to thred R leg and min A for balance to get pants closed. Pt required max A with 2 attempts to perform sit to stand from EOB with RW in order to get pants pulled up. Focused on functional strength and endurance with w/c mobility down to therapy gym with extra time. Worked on multiple sit to stands throughout session for improved strengthening to aid with transfer ranging from min to mod A during session. Gait with RW x 50' and then x 90' with min A and cues for posture and to maintain body inside of RW.  Pt required frequent rest breaks due to fatigue and cues for sequencing and initiation. Returned back to room with family present and observed min A transfer back to bed. Educated pt's family on session and pt's status this AM. Family meeting with primary PT, OT, and SLP to occur after this session to discuss family's concerns and current therapy status in regards to goals.  LTG's downgraded to overall min A due to pt's fluctuating status with physical and cognitive assist needed.   Therapy Documentation Precautions:  Precautions Precautions: Fall, ICD/Pacemaker Precaution Comments: will need sx again to complete pacemaker placement Restrictions Weight Bearing Restrictions: No  Pain:  No complaints.  Locomotion : Ambulation Ambulation/Gait Assistance: 4: Min assist   See FIM for current functional status  Therapy/Group: Individual Therapy  [name], PT, DPT  03/27/2015, 12:25 PM

## 2015-03-28 ENCOUNTER — Inpatient Hospital Stay (HOSPITAL_COMMUNITY): Payer: Medicare Other | Admitting: Occupational Therapy

## 2015-03-28 ENCOUNTER — Inpatient Hospital Stay (HOSPITAL_COMMUNITY): Payer: Medicare Other | Admitting: Speech Pathology

## 2015-03-28 ENCOUNTER — Inpatient Hospital Stay (HOSPITAL_COMMUNITY): Payer: Medicare Other

## 2015-03-28 LAB — GLUCOSE, CAPILLARY
GLUCOSE-CAPILLARY: 88 mg/dL (ref 70–99)
GLUCOSE-CAPILLARY: 80 mg/dL (ref 70–99)
Glucose-Capillary: 108 mg/dL — ABNORMAL HIGH (ref 70–99)
Glucose-Capillary: 78 mg/dL (ref 70–99)

## 2015-03-28 LAB — PROTIME-INR
INR: 2.79 — ABNORMAL HIGH (ref 0.00–1.49)
Prothrombin Time: 29.6 seconds — ABNORMAL HIGH (ref 11.6–15.2)

## 2015-03-28 NOTE — Progress Notes (Signed)
Speech Language Pathology Daily Session Note  Patient Details  Name: Frank Mclean MRN: [id number] Date of Birth: October 03, 1941  Today's Date: 03/28/2015 SLP Individual Time: 1400-1430 SLP Individual Time Calculation (min): 30 min  Short Term Goals: Week 2: SLP Short Term Goal 1 (Week 2): short term goals = long term goals  Skilled Therapeutic Interventions: Skilled treatment session focused on dysphagia goals and patient/family education. Upon arrival, patient was awake while sitting upright in the wheelchair with brother-in-law [name]) present.  SLP facilitated session by providing education to the patient and his family member in regards to his current swallowing function, diet recommendations, appropriate textures and swallowing compensatory strategies. Handouts were also given to reinforce information. SLP also provided skilled observation with lunch meal of Dys. 3 textures with thin liquids as well as demonstration to the patient's family member on how to appropriately cue patient for utilization of swallowing compensatory strategies throughout the meal. Patient consumed meal without overt s/s of aspiration and was overall Mod I for use of swallowing strategies. Patient's family member also demonstrated appropriate cueing, therefore, he was signed off on providing supervision to patient with meals. Patient left finishing lunch meal with family member present. Continue with current plan of care.    FIM:  Comprehension Comprehension Mode: Auditory Comprehension: 5-Understands basic 90% of the time/requires cueing < 10% of the time Expression Expression Mode: Verbal Expression: 5-Expresses basic 90% of the time/requires cueing < 10% of the time. Social Interaction Social Interaction: 5-Interacts appropriately 90% of the time - Needs monitoring or encouragement for participation or interaction. Problem Solving Problem Solving: 4-Solves basic 75 - 89% of the time/requires cueing 10 - 24% of the  time Memory Memory: 4-Recognizes or recalls 75 - 89% of the time/requires cueing 10 - 24% of the time FIM - Eating Eating Activity: 6: More than reasonable amount of time  Pain Pain Assessment Pain Assessment: No/denies pain  Therapy/Group: Individual Therapy  Kayelynn Abdou 03/28/2015, 3:28 PM

## 2015-03-28 NOTE — Progress Notes (Signed)
[facility]IC[facility]ABILITATION     PROGRESS NOTE    Subjective/Complaints: Feeling well. Slept well. No confusion,hallucinations. Therapy still wears him out   Objective: Vital Signs: Blood pressure 129/76, pulse 57, temperature 98.4 F (36.9 C), temperature source Oral, resp. rate 18, weight 99 kg (218 lb 4.1 oz), SpO2 92 %. No results found.  Recent Labs  03/25/15 1628 03/27/15 0624  WBC 10.0 8.6  HGB 9.0* 8.9*  HCT 29.3* 28.9*  PLT 294 289    Recent Labs  03/25/15 1628 03/27/15 0624  NA 145 145  K 3.8 3.3*  CL 108 110  GLUCOSE 111* 77  BUN 28* 18  CREATININE 2.34* 2.05*  CALCIUM 8.4 8.4   CBG (last 3)   Recent Labs  03/27/15 1610 03/27/15 2209 03/28/15 0649  GLUCAP 96 81 88    Wt Readings from Last 3 Encounters:  03/28/15 99 kg (218 lb 4.1 oz)  03/14/15 95.5 kg (210 lb 8.6 oz)  03/13/15 92.08 kg (203 lb)    Physical Exam:  Constitutional: He is oriented to person, place, and time. He appears well-developed.  HENT: PERRL. Dentition fair Head: Normocephalic.  Eyes: EOM are normal.  Neck: Normal range of motion. Neck supple. No thyromegaly present.  Cardiovascular:  Cardiac rate controlled without murmur. No edema appreciated Respiratory:  Lungs decreased breath sounds at the bases. A few rhonchi GI: Soft. Bowel sounds are normal. He exhibits no distension.  Neurological: He is alert and oriented to person, place, and time.  Follow simple commands. Moves all 4's.3-4/5 UE's and 2+ hf, 3 ke and 4/5 ankles  Skin: Skin is warm and dry.  Psychiatric: He has a normal mood and affect. His behavior is normal. Thought content normal   Assessment/Plan: 1. Functional deficits secondary to debility and gait disorder which require 3+ hours per day of interdisciplinary therapy in a comprehensive inpatient rehab setting. Physiatrist is providing close team supervision and 24 hour management of active medical problems listed  below. Physiatrist and rehab team continue to assess barriers to discharge/monitor patient progress toward functional and medical goals.     FIM: FIM - Bathing Bathing Steps Patient Completed: Chest, Right Arm, Left Arm, Abdomen, Front perineal area, Right upper leg, Left upper leg, Buttocks, Right lower leg (including foot), Left lower leg (including foot) Bathing: 5: Supervision: Safety issues/verbal cues  FIM - Upper Body Dressing/Undressing Upper body dressing/undressing steps patient completed: Thread/unthread left sleeve of pullover shirt/dress, Put head through opening of pull over shirt/dress, Pull shirt over trunk, Thread/unthread right sleeve of pullover shirt/dresss Upper body dressing/undressing: 5: Supervision: Safety issues/verbal cues FIM - Lower Body Dressing/Undressing Lower body dressing/undressing steps patient completed: Don/Doff left sock, Don/Doff right sock Lower body dressing/undressing: 2: Max-Patient completed 25-49% of tasks  FIM - Toileting Toileting steps completed by patient: Adjust clothing prior to toileting, Performs perineal hygiene, Adjust clothing after toileting Toileting Assistive Devices: Grab bar or rail for support Toileting: 4: Steadying assist  FIM - Diplomatic Services operational officer Devices: Environmental consultant, Therapist, music Transfers: 4-To toilet/BSC: Min A (steadying Pt. > 75%), 5-From toilet/BSC: Supervision (verbal cues/safety issues)  FIM - Banker Devices: Walker, Arm rests Bed/Chair Transfer: 4: Chair or W/C > Bed: Min A (steadying Pt. > 75%), 5: Sit > Supine: Supervision (verbal cues/safety issues)  FIM - Locomotion: Wheelchair Distance: 75 Locomotion: Wheelchair: 2: Travels 50 - 149 ft with supervision, cueing or coaxing FIM - Locomotion: Ambulation Locomotion: Ambulation Assistive Devices: Environmental consultant -  Rolling Ambulation/Gait Assistance: 4: Min assist Locomotion: Ambulation: 4: Travels 150  ft or more with minimal assistance (Pt.>75%)  Comprehension Comprehension Mode: Auditory Comprehension: 5-Understands basic 90% of the time/requires cueing < 10% of the time  Expression Expression Mode: Verbal Expression: 5-Expresses basic 90% of the time/requires cueing < 10% of the time.  Social Interaction Social Interaction: 5-Interacts appropriately 90% of the time - Needs monitoring or encouragement for participation or interaction.  Problem Solving Problem Solving: 4-Solves basic 75 - 89% of the time/requires cueing 10 - 24% of the time  Memory Memory: 3-Recognizes or recalls 50 - 74% of the time/requires cueing 25 - 49% of the time  Medical Problem List and Plan: 1. Functional deficits secondary to Debility /HCAP/Gait disorder 2. DVT Prophylaxis/Anticoagulation: Chronic coumadin/A fib. 3. Pain Management: Hydrocodone as needed. 4. Mood: Lexapro 5 mg qhs  -CT with baseline atrophy 5. Neuropsych: This patient is capable of making decisions on her own behalf.  -unresponsive episode earlier this week--- ?bp related.  No indications of a seizure 6. Skin/Wound Care: Routine skin checks 7. Fluids/Electrolytes/Nutrition: replace K+ 8.ID/HCAP. Augmentin completed.  -wbc's  decreased)  -IS, FV, OOB  -UA equivocal, culture negative, he's afebrile 9.LBBB./non ischemic cardiomyopathy.Follow up cardiology on defibrillator in reference to being unable to place LV lead due to no appropriate veins and plan follow-up  -net I/O's negative, cxr with interstitial edema  -appreciate cards help  -torsemide increased to  bid  -weights daily  -checking orthostatic VS 10.CRI.Baseline Creatinine 2.28-2.75 at baseline 11. Hypertension. Norvasc 10 mg daily, Coreg 12.5 mg twice a day, hydralazine 100 mg 3 times a day, Imdur 90 mg twice a day, Demadex 10 mg daily. Monitor with increased mobility 12. GERD versus esophageal dysmotility Dysphagia 2 thin liquid diet. Advance diet as  tolerated 13. Hypothyroidism. Synthroid 14. Hyperlipidemia. Lipitor 15. Acute on chronic anemia. Continue iron supplement. Follow-up hgb 8.8 16. COPD. Symbicort,  mucinex bid 17. Diabetes: resumed glipizide 2.5mg  bid, SSI  -controlled   LOS (Days) 11 A FACE TO FACE EVALUATION WAS PERFORMED  Frank Mclean T 03/28/2015 8:06 AM

## 2015-03-28 NOTE — Progress Notes (Signed)
Appears hemodynamically compensated. Slow progress with rehab therapy. Will be available for questions over the weekend. Please notify of DC plans so that we can arrange follow up.

## 2015-03-28 NOTE — Progress Notes (Signed)
ANTICOAGULATION CONSULT NOTE - Follow Up Consult  Pharmacy Consult for coumadin Indication: atrial fibrillation  Allergies  Allergen Reactions  . Levothyroxine Sodium Other (See Comments)    MUST TAKE BRAND NAME GENERIC DOESN'T WORK FOR PATIENT  . Chicken Protein     Does not like chicken or [place]  . Eggs Or Egg-Derived Products     Does not like eggs    Patient Measurements: Weight: 218 lb 4.1 oz (99 kg) Heparin Dosing Weight:   Vital Signs: Temp: 98.4 F (36.9 C) (04/08 0649) Temp Source: Oral (04/08 0649) BP: 129/76 mmHg (04/08 0649) Pulse Rate: 57 (04/08 0649)  Labs:  Recent Labs  03/25/15 1628 03/26/15 0655 03/27/15 0624 03/28/15 0423  HGB 9.0*  --  8.9*  --   HCT 29.3*  --  28.9*  --   PLT 294  --  289  --   LABPROT  --  26.3*  --  29.6*  INR  --  2.39*  --  2.79*  CREATININE 2.34*  --  2.05*  --     Estimated Creatinine Clearance: 39.1 mL/min (by C-G formula based on Cr of 2.05).   Medications:  Scheduled:  . amLODipine  10 mg Oral Daily  . atorvastatin  20 mg Oral Daily  . budesonide-formoterol  2 puff Inhalation BID  . carvedilol  12.5 mg Oral BID WC  . cholecalciferol  1,000 Units Oral Daily  . escitalopram  5 mg Oral QHS  . feeding supplement (GLUCERNA SHAKE)  237 mL Oral BID BM  . ferrous sulfate  325 mg Oral TID WC  . glipiZIDE  2.5 mg Oral BID AC  . guaiFENesin  600 mg Oral BID  . hydrALAZINE  100 mg Oral TID  . insulin aspart  0-9 Units Subcutaneous TID WC  . isosorbide mononitrate  90 mg Oral BID  . levothyroxine  200 mcg Oral QAC breakfast  . multivitamin with minerals  1 tablet Oral Daily  . nystatin  5 mL Oral QID  . pantoprazole  40 mg Oral BID  . polyethylene glycol  17 g Oral Daily  . potassium chloride  20 mEq Oral Daily  . torsemide  10 mg Oral Daily  . torsemide  20 mg Oral Once  . cyanocobalamin  1,000 mcg Oral Daily  . warfarin  2.5 mg Oral Once per day on Mon Fri  . warfarin  5 mg Oral Once per day on Sun Tue Wed Thu  Sat  . Warfarin - Pharmacist Dosing Inpatient   Does not apply q1800   Infusions:    Assessment: 74 yo male with afib is currently on therapeutic coumadin.  INR today is 2.79.   Goal of Therapy:  INR 2-3 Monitor platelets by anticoagulation protocol: Yes   Plan:  Coumadin 5mg  daily except 2.5mg  Mon/Fri. INR M/W/F  Assata Juncaj, Tsz-Yin 03/28/2015,8:19 AM

## 2015-03-28 NOTE — Progress Notes (Signed)
Physical Therapy Session Note  Patient Details  Name: Frank Mclean MRN: [id number] Date of Birth: [birth year]  Today's Date: 03/28/2015 PT Individual Time: 1300-1400 PT Individual Time Calculation (min): 60 min   Short Term Goals: Week 2:  PT Short Term Goal 1 (Week 2): = LTGs  Skilled Therapeutic Interventions/Progress Updates:    Pt presents in bed and agreeable to session. EOB addressed sit to stands and dynamic standing balance to pull up pants with mod A x 3 attempts for sit to stand min A for balance. Transferred to w/c with min A overall with RW. Stair negotiation with L rail with min A to ascend and mod A to descend with cues for which foot to lead with and step by step pattern. Min A for gait with RW for overall functional mobility training with cues for posture and safety. Pt's brother in law, [name], present intermittently during session and performed hands-on transfer training for car transfer with RW. Pt repeated x 2 with therapist and x 2 with [name] return demonstration with cues for safe technique and guarding as pt with 1 episode of uncontrolled descent due to LOB into chair. Reviewed importance of reaching back for chair before sitting and to slow movement when turning to decrease LOB risk. Pt self propelled w/c some of the distance back to room for UE strengthening and endurance. Pt tearful x 2 during session when brother in law present, emotional support provided. Hand off to SLP.  Therapy Documentation Precautions:  Precautions Precautions: Fall, ICD/Pacemaker Precaution Comments: will need sx again to complete pacemaker placement  Pain:  No complaints of pain.   See FIM for current functional status  Therapy/Group: Individual Therapy  Karolee Stamps Darrol Poke, PT, DPT  03/28/2015, 2:44 PM

## 2015-03-28 NOTE — Progress Notes (Signed)
Occupational Therapy Session Note  Patient Details  Name: Frank Mclean MRN: [id number] Date of Birth: 1941-01-08  Today's Date: 03/28/2015 OT Individual Time: 1000-1100 OT Individual Time Calculation (min): 60 min    Short Term Goals: Week 2:  OT Short Term Goal 1 (Week 2): STG= LTG due to estimated LOS  Skilled Therapeutic Interventions/Progress Updates:    Pt seen for OT session focusing on functional transfers and functional activity tolerance. Pt in recliner upon arrival agreeable to tx. Pt transferred sit > stand from recliner with mod assist. He ambulated within room with CGA and RW. Pt sat in w/c to complete grooming task at the sink. Pt then taken to ADL apartment via w/c to practice functional transfers. Pt ambulated throughout apartment with RW and min A. Pt transferred onto/ off of toilet with min A and cues for hand placement during transfers. He then completed simulated shower transfer simulating walk in shower. Pt required steadying assist and cues for sequencing to step over 4 inch thresh hold and sit on shower chair. Mod A required to stand from shower chair. Pt completed sit <> stand transfer onto soft low surfaced couch. He required min-mod A for controled descent and max A with cues to stand from low surface to RW. Pt required cues throughout session to scoot forward in seat prior to all transfers and cues for hand placement on RW.    At end of session, pt transferred in w/c back to pt's room. He ambulated across room to flat bed with CGA and RW. He completed stand > sitting on EOB with min A and EOB> supine with supervision. Upon therapist leaving room, pt became tearful. Emotional support provided by therapist. Pt left in supine all needs in reach at end of session.    Education provided regarding functional transfers and hand positioning, DME, home layout, energy conservation, deep breathing techniques, and d/c planning.   Therapy Documentation Precautions:   Precautions Precautions: Fall, ICD/Pacemaker Precaution Comments: will need sx again to complete pacemaker placement Required Braces or Orthoses: Sling Restrictions Weight Bearing Restrictions: No Other Position/Activity Restrictions: L arm in sling Pain: Pain Assessment Pain Assessment: No/denies pain  See FIM for current functional status  Therapy/Group: Individual Therapy  [name], Shandell Giovanni C 03/28/2015, 10:08 AM

## 2015-03-28 NOTE — Progress Notes (Signed)
Physical Therapy Session Note  Patient Details  Name: Frank Mclean MRN: [id number] Date of Birth: 08/26/1941  Today's Date: 03/28/2015 PT Individual Time: 0900-1000 PT Individual Time Calculation (min): 60 min   Short Term Goals: Week 2:  PT Short Term Goal 1 (Week 2): = LTGs  Skilled Therapeutic Interventions/Progress Updates:    No complaints of pain. Session focused on family education with pt and pt's wife with focus on functional transfers, dressing EOB to don pants, toileting, gait, and demonstrated stairs. Pt tearful at beginning of session and emotional support and encouragement provided. Pt required extra time and multiple attempts for sit to stand from EOB with RW (mod to max A) in order to pull up pants and cues for technique and hand placement. Pt then able to perform gait on unit with min A ~ 150' with cues for safety with RW and to keep RW close to body for support. Pt with need to use bathroom and performed min A transfer to toilet and mod A off of toilet using grab bars and RW for support (required A with clothing management by pt's wife). Pt and wife report there isn't anything to hold onto in their bathroom - recommended talking with OT about further recommendations. Pt performed stairs with min to mod A and difficulty only using single rail despite cueing to simulate home environment. Pt's wife educated on importance of staying close to patient during mobility and recommending hands on at this time due to fall risk as wife providing more distant S when assisting with transfer to recliner when back in room. Pt's wife left at end of session.  Therapy Documentation Precautions:  Precautions Precautions: Fall, ICD/Pacemaker Precaution Comments: will need sx again to complete pacemaker placement Locomotion : Ambulation Ambulation/Gait Assistance: 4: Min assist   See FIM for current functional status  Therapy/Group: Individual Therapy  Karolee Stamps Darrol Poke, PT,  DPT  03/28/2015, 12:07 PM

## 2015-03-29 ENCOUNTER — Inpatient Hospital Stay (HOSPITAL_COMMUNITY): Payer: Medicare Other | Admitting: Occupational Therapy

## 2015-03-29 LAB — GLUCOSE, CAPILLARY
Glucose-Capillary: 87 mg/dL (ref 70–99)
Glucose-Capillary: 121 mg/dL — ABNORMAL HIGH (ref 70–99)
Glucose-Capillary: 91 mg/dL (ref 70–99)
Glucose-Capillary: 137 mg/dL — ABNORMAL HIGH (ref 70–99)

## 2015-03-29 NOTE — Progress Notes (Signed)
Occupational Therapy Session Note  Patient Details  Name: Frank Mclean MRN: [id number] Date of Birth: [birth year]  Today's Date: 03/29/2015 OT Individual Time: 1500-1530 OT Individual Time Calculation (min): 30 min    Short Term Goals: Week 2:  OT Short Term Goal 1 (Week 2): STG= LTG due to estimated LOS  Skilled Therapeutic Interventions/Progress Updates:    Pt seen for ADL bathing and dressing session. Pt supine upon arrival, agreeable to tx. Pt transferred supine> EOB with HOB flat and use of bed rail with increased time. Seated on EOB, pt bathed UB and LB with the exception of completing pericare/buttock hygiene with supervision. He dressed UB and LB seated EOB He required max VC and min A for management of clothing to don front closure shirt, and total assist to fasten buttons secondary to confusion of clothing management. [name] He was able to don pull over under shirt mod I. TED hose donned by therapist. Pt stood to pull pants up with min A and required steadying assist while fastening pants. He required mod A for sit> stand at rolling walker to ambulate within room. He walked from EOB> recliner, ~10 ft, with RW and CGA. Pt left in recliner at end of session, all needs in reach.    Pt requires min-mod VCs for sequencing and technique to complete bathing and grooming task secondary to cognitive confusion.  Education provided regarding dressing techniques, need for assist, and d/c planning.   Therapy Documentation Precautions:  Precautions Precautions: Fall, ICD/Pacemaker Precaution Comments: will need sx again to complete pacemaker placement Required Braces or Orthoses: Sling Restrictions Weight Bearing Restrictions: No Other Position/Activity Restrictions: L arm in sling Pain: Pain Assessment Pain Assessment: No/denies pain  See FIM for current functional status  Therapy/Group: Individual Therapy  [name], Taneisha Fuson C 03/29/2015, 3:13 PM

## 2015-03-29 NOTE — Progress Notes (Signed)
[place] PHYSICAL MEDICINE & REHABILITATION     PROGRESS NOTE    Subjective/Complaints: Feeling well. Slept well. No confusion,hallucinations. Therapy still wears him out   Objective: Vital Signs: Blood pressure 125/66, pulse 55, temperature 97.5 F (36.4 C), temperature source Oral, resp. rate 18, weight 97.977 kg (216 lb), SpO2 100 %. No results found.  Recent Labs  03/27/15 0624  WBC 8.6  HGB 8.9*  HCT 28.9*  PLT 289    Recent Labs  03/27/15 0624  NA 145  K 3.3*  CL 110  GLUCOSE 77  BUN 18  CREATININE 2.05*  CALCIUM 8.4   CBG (last 3)   Recent Labs  03/28/15 1606 03/28/15 2203 03/29/15 0644  GLUCAP 108* 78 87    Wt Readings from Last 3 Encounters:  03/29/15 97.977 kg (216 lb)  03/14/15 95.5 kg (210 lb 8.6 oz)  03/13/15 92.08 kg (203 lb)    Physical Exam:  Constitutional: He is oriented to person, place, and time. He appears well-developed.  HENT: PERRL. Head: Normocephalic.  Eyes: EOM are normal.  Neck: Normal range of motion. Neck supple. No thyromegaly present.  Cardiovascular:  Cardiac rate controlled without murmur. No edema appreciated Respiratory:  Lungs decreased breath sounds at the bases.  GI: Soft. Bowel sounds are normal. He exhibits no distension.  Neurological: He is alert and oriented to person, place, and time.  Follow simple commands. Moves all 4's.3-4/5 UE's and 2+ hf, 3 ke and 4/5 ankles  Skin: Skin is warm and dry.     Assessment/Plan: 1. Functional deficits secondary to debility and gait disorder which require 3+ hours per day of interdisciplinary therapy in a comprehensive inpatient rehab setting. Physiatrist is providing close team supervision and 24 hour management of active medical problems listed below. Physiatrist and rehab team continue to assess barriers to discharge/monitor patient progress toward functional and medical goals.     FIM: FIM - Bathing Bathing Steps Patient Completed: Chest, Right Arm,  Left Arm, Abdomen, Front perineal area, Right upper leg, Left upper leg, Buttocks, Right lower leg (including foot), Left lower leg (including foot) Bathing: 5: Supervision: Safety issues/verbal cues  FIM - Upper Body Dressing/Undressing Upper body dressing/undressing steps patient completed: Thread/unthread left sleeve of pullover shirt/dress, Put head through opening of pull over shirt/dress, Pull shirt over trunk, Thread/unthread right sleeve of pullover shirt/dresss Upper body dressing/undressing: 5: Supervision: Safety issues/verbal cues FIM - Lower Body Dressing/Undressing Lower body dressing/undressing steps patient completed: Don/Doff left sock, Don/Doff right sock Lower body dressing/undressing: 2: Max-Patient completed 25-49% of tasks  FIM - Toileting Toileting steps completed by patient: Performs perineal hygiene Toileting Assistive Devices: Grab bar or rail for support Toileting: 2: Max-Patient completed 1 of 3 steps  FIM - Diplomatic Services operational officer Devices: Environmental consultant, Therapist, music Transfers: 4-To toilet/BSC: Min A (steadying Pt. > 75%), 4-From toilet/BSC: Min A (steadying Pt. > 75%)  FIM - Bed/Chair Transfer Bed/Chair Transfer Assistive Devices: Walker, Arm rests Bed/Chair Transfer: 5: Supine > Sit: Supervision (verbal cues/safety issues), 3: Bed > Chair or W/C: Mod A (lift or lower assist)  FIM - Locomotion: Wheelchair Distance: 75 Locomotion: Wheelchair: 2: Travels 50 - 149 ft with supervision, cueing or coaxing FIM - Locomotion: Ambulation Locomotion: Ambulation Assistive Devices: Designer, industrial/product Ambulation/Gait Assistance: 4: Min assist Locomotion: Ambulation: 4: Travels 150 ft or more with minimal assistance (Pt.>75%)  Comprehension Comprehension Mode: Auditory Comprehension: 5-Understands basic 90% of the time/requires cueing < 10% of the time  Expression Expression  Mode: Verbal Expression: 5-Expresses basic 90% of the time/requires cueing <  10% of the time.  Social Interaction Social Interaction: 5-Interacts appropriately 90% of the time - Needs monitoring or encouragement for participation or interaction.  Problem Solving Problem Solving: 4-Solves basic 75 - 89% of the time/requires cueing 10 - 24% of the time  Memory Memory: 4-Recognizes or recalls 75 - 89% of the time/requires cueing 10 - 24% of the time  Medical Problem List and Plan: 1. Functional deficits secondary to Debility /HCAP/Gait disorder 2. DVT Prophylaxis/Anticoagulation: Chronic coumadin/A fib. 3. Pain Management: Hydrocodone as needed. 4. Mood: Lexapro 5 mg qhs  -CT with baseline atrophy 5. Neuropsych: This patient is capable of making decisions on her own behalf.  -unresponsive episode earlier this week--- ?bp related.  No indications of a seizure 6. Skin/Wound Care: Routine skin checks 7. Fluids/Electrolytes/Nutrition: replace K+ 8.ID/HCAP. Augmentin completed.  -wbc's  decreased)  -IS, FV, OOB  -UA equivocal, culture negative, he's afebrile 9.LBBB./non ischemic cardiomyopathy.Follow up cardiology on defibrillator in reference to being unable to place LV lead due to no appropriate veins and plan follow-up  -net I/O's negative, cxr with interstitial edema  -appreciate cards help  -torsemide increased to 10mg  bid  -weights daily  -checking orthostatic VS 10.CRI.Baseline Creatinine 2.28-2.75 at baseline 11. Hypertension. Norvasc 10 mg daily, Coreg 12.5 mg twice a day, hydralazine 100 mg 3 times a day, Imdur 90 mg twice a day, Demadex 10 mg daily. Monitor with increased mobility 12. GERD versus esophageal dysmotility Dysphagia 2 thin liquid diet. Advance diet as tolerated 13. Hypothyroidism. Synthroid 14. Hyperlipidemia. Lipitor 15. Acute on chronic anemia. Continue iron supplement. Follow-up hgb 8.8 16. COPD. Symbicort,  mucinex bid 17. Diabetes: resumed glipizide 2.5mg  bid, SSI  -controlled   LOS (Days) 12 A FACE TO FACE EVALUATION WAS  PERFORMED  Fritz Cauthon E 03/29/2015 10:40 AM

## 2015-03-30 ENCOUNTER — Inpatient Hospital Stay (HOSPITAL_COMMUNITY): Payer: Medicare Other

## 2015-03-30 ENCOUNTER — Inpatient Hospital Stay (HOSPITAL_COMMUNITY): Payer: Medicare Other | Admitting: Physical Therapy

## 2015-03-30 LAB — GLUCOSE, CAPILLARY
GLUCOSE-CAPILLARY: 97 mg/dL (ref 70–99)
GLUCOSE-CAPILLARY: 115 mg/dL — AB (ref 70–99)
Glucose-Capillary: 103 mg/dL — ABNORMAL HIGH (ref 70–99)
Glucose-Capillary: 115 mg/dL — ABNORMAL HIGH (ref 70–99)

## 2015-03-30 NOTE — Progress Notes (Signed)
[facility] PHYSICAL MEDICINE & REHABILITATION     PROGRESS NOTE    Subjective/Complaints: No pain complaints this morning, breathing well.  Review of systems negative except as above   Objective: Vital Signs: Blood pressure 131/77, pulse 62, temperature 98 F (36.7 C), temperature source Oral, resp. rate 18, weight 99.383 kg (219 lb 1.6 oz), SpO2 96 %. No results found. No results for input(s): WBC, HGB, HCT, PLT in the last 72 hours. No results for input(s): NA, K, CL, GLUCOSE, BUN, CREATININE, CALCIUM in the last 72 hours.  Invalid input(s): CO CBG (last 3)   Recent Labs  03/29/15 1646 03/29/15 2209 03/30/15 0641  GLUCAP 91 137* 103*    Wt Readings from Last 3 Encounters:  03/30/15 99.383 kg (219 lb 1.6 oz)  03/14/15 95.5 kg (210 lb 8.6 oz)  03/13/15 92.08 kg (203 lb)    Physical Exam:  Constitutional: He is oriented to person, place, and time. He appears well-developed.  HENT: PERRL. Head: Normocephalic.  Eyes: EOM are normal.  Neck: Normal range of motion. Neck supple. No thyromegaly present.  Cardiovascular:  Cardiac rate controlled without murmur. No edema appreciated Respiratory:  Lungs decreased breath sounds at the bases.  GI: Soft. Bowel sounds are normal. He exhibits no distension.  Neurological: He is alert and oriented to person, place, and time.  Follow simple commands. Moves all 4's.3-4/5 UE's and 2+ hf, 3 ke and 4/5 ankles  Skin: Skin is warm and dry.     Assessment/Plan: 1. Functional deficits secondary to debility and gait disorder which require 3+ hours per day of interdisciplinary therapy in a comprehensive inpatient rehab setting. Physiatrist is providing close team supervision and 24 hour management of active medical problems listed below. Physiatrist and rehab team continue to assess barriers to discharge/monitor patient progress toward functional and medical goals.     FIM: FIM - Bathing Bathing Steps Patient Completed:  Chest, Right Arm, Left Arm, Abdomen, Right upper leg, Left upper leg, Right lower leg (including foot), Left lower leg (including foot) Bathing: 5: Supervision: Safety issues/verbal cues  FIM - Upper Body Dressing/Undressing Upper body dressing/undressing steps patient completed: Thread/unthread left sleeve of pullover shirt/dress, Put head through opening of pull over shirt/dress, Pull shirt over trunk, Thread/unthread right sleeve of pullover shirt/dresss Upper body dressing/undressing: 3: Mod-Patient completed 50-74% of tasks FIM - Lower Body Dressing/Undressing Lower body dressing/undressing steps patient completed: Thread/unthread right pants leg, Thread/unthread left pants leg, Pull pants up/down, Fasten/unfasten pants, Don/Doff right sock, Don/Doff left sock Lower body dressing/undressing: 4: Steadying Assist  FIM - Toileting Toileting steps completed by patient: Performs perineal hygiene Toileting Assistive Devices: Grab bar or rail for support Toileting: 2: Max-Patient completed 1 of 3 steps  FIM - Diplomatic Services operational officer Devices: Environmental consultant, Therapist, music Transfers: 4-To toilet/BSC: Min A (steadying Pt. > 75%), 4-From toilet/BSC: Min A (steadying Pt. > 75%)  FIM - Bed/Chair Transfer Bed/Chair Transfer Assistive Devices: Bed rails Bed/Chair Transfer: 5: Supine > Sit: Supervision (verbal cues/safety issues), 3: Bed > Chair or W/C: Mod A (lift or lower assist)  FIM - Locomotion: Wheelchair Distance: 75 Locomotion: Wheelchair: 2: Travels 50 - 149 ft with supervision, cueing or coaxing FIM - Locomotion: Ambulation Locomotion: Ambulation Assistive Devices: Designer, industrial/product Ambulation/Gait Assistance: 4: Min assist Locomotion: Ambulation: 4: Travels 150 ft or more with minimal assistance (Pt.>75%)  Comprehension Comprehension Mode: Auditory Comprehension: 5-Understands basic 90% of the time/requires cueing < 10% of the time  Expression Expression Mode:  Verbal Expression: 5-Expresses basic 90% of the time/requires cueing < 10% of the time.  Social Interaction Social Interaction: 5-Interacts appropriately 90% of the time - Needs monitoring or encouragement for participation or interaction.  Problem Solving Problem Solving: 4-Solves basic 75 - 89% of the time/requires cueing 10 - 24% of the time  Memory Memory: 4-Recognizes or recalls 75 - 89% of the time/requires cueing 10 - 24% of the time  Medical Problem List and Plan: 1. Functional deficits secondary to Debility /HCAP/Gait disorder 2. DVT Prophylaxis/Anticoagulation: Chronic coumadin/A fib. 3. Pain Management: Hydrocodone as needed. 4. Mood: Lexapro 5 mg qhs  -CT with baseline atrophy 5. Neuropsych: This patient is capable of making decisions on her own behalf.  -unresponsive episode earlier this week--- ?bp related.  No indications of a seizure 6. Skin/Wound Care: Routine skin checks 7. Fluids/Electrolytes/Nutrition: replace K+ 8.ID/HCAP. Augmentin completed.  -wbc's  decreased)  -IS, FV, OOB  -UA equivocal, culture negative, he's afebrile 9.LBBB./non ischemic cardiomyopathy.Follow up cardiology on defibrillator in reference to being unable to place LV lead due to no appropriate veins and plan follow-up  -net I/O's negative, cxr with interstitial edema  -appreciate cards help  -torsemide increased to  bid  -weights daily  -checking orthostatic VS 10.CRI.Baseline Creatinine 2.28-2.75 at baseline 11. Hypertension. Norvasc 10 mg daily, Coreg 12.5 mg twice a day, hydralazine 100 mg 3 times a day, Imdur 90 mg twice a day, Demadex 10 mg daily. Monitor with increased mobility 12. GERD versus esophageal dysmotility Dysphagia 2 thin liquid diet. Advance diet as tolerated 13. Hypothyroidism. Synthroid 14. Hyperlipidemia. Lipitor 15. Acute on chronic anemia. Continue iron supplement. Follow-up hgb 8.8 16. COPD. Symbicort,  mucinex bid 17. Diabetes: resumed glipizide 2.5mg  bid,  SSI  -controlled   LOS (Days) 13 A FACE TO FACE EVALUATION WAS PERFORMED  Raymona Boss E 03/30/2015 10:45 AM

## 2015-03-30 NOTE — Progress Notes (Signed)
Physical Therapy Session Note  Patient Details  Name: Frank Mclean MRN: [id number] Date of Birth: 03-Oct-1941  Today's Date: 03/30/2015 PT Individual Time: 1030-1100 PT Individual Time Calculation (min): 30 min   Short Term Goals: Week 2:  PT Short Term Goal 1 (Week 2): = LTGs  Skilled Therapeutic Interventions/Progress Updates:    Pt received seated in bed, agreeable to participate in therapy. Session focused on sit<>stands, gait, stair negotiation, w/c propulsion. Pt transferred bed>w/c w/ stand pivot w/ RW and min guard A. Pt propelled w/c w/ BUE w/ SBA 100' at a time before needing to stop and rest. In rehab gym pt ambulated 15'x2, ascended/descended 5 stairs w/ 2 rails, then ambulated 60' w/ RW all w/ overall MinGuard A. Pt tolerated session well, required rest breaks between tasks. Session ended in pt's room, where pt was left seated in w/c w/ all needs within reach.    Therapy Documentation Precautions:  Precautions Precautions: Fall, ICD/Pacemaker Precaution Comments: will need sx again to complete pacemaker placement Required Braces or Orthoses: Sling Restrictions Weight Bearing Restrictions: No Other Position/Activity Restrictions: L arm in sling Pain: Pain Assessment Pain Assessment: No/denies pain  See FIM for current functional status  Therapy/Group: Individual Therapy  Hosie Spangle  Hosie Spangle, PT, DPT 03/30/2015, 7:47 AM

## 2015-03-30 NOTE — Progress Notes (Signed)
Physical Therapy Session Note  Patient Details  Name: Frank Mclean MRN: [id number] Date of Birth: 03/04/1941  Today's Date: 03/30/2015 PT Individual Time: 1135-1205 PT Individual Time Calculation (min): 30 min   Short Term Goals: Week 1:  PT Short Term Goal 1 (Week 1): Pt will be able to transfer with min A from various surface heights PT Short Term Goal 1 - Progress (Week 1): Partly met (Pt had met this until decline over last couple days) PT Short Term Goal 2 (Week 1): Pt will be able to gait x 150' with min A PT Short Term Goal 2 - Progress (Week 1): Partly met (Pt had met this until decline over last couple days) PT Short Term Goal 3 (Week 1): Pt will be able to do 5 stairs with 1 rail with mod A PT Short Term Goal 3 - Progress (Week 1): Partly met (Pt had met this until decline over last couple days) PT Short Term Goal 4 (Week 1): Pt will demonstrate dynamic standing balance during functional task with min A PT Short Term Goal 4 - Progress (Week 1): Partly met (Pt had met this until decline over last couple days) Week 2:  PT Short Term Goal 1 (Week 2): = LTGs  Skilled Therapeutic Interventions/Progress Updates:   Pt limited in session by fatigue requiring frequent rest breaks to manage. Pt most motivated to work on transfers, eager to prepare for discharge home. Slouched posture also contributes to mechanical disadvantage with transfers limiting pt's anterior weight shift. Pt would continue to benefit from individualized skilled PT services to increase functional mobility.  Therapy Documentation Precautions:  Precautions Precautions: Fall, ICD/Pacemaker Precaution Comments: will need sx again to complete pacemaker placement Required Braces or Orthoses: Sling Restrictions Weight Bearing Restrictions: No Other Position/Activity Restrictions: L arm in sling Pain: Pain Assessment Pain Assessment: No/denies pain Mobility:  Min A with cues for hand placement for  transfers Locomotion : Ambulation Ambulation/Gait Assistance: 4: Min assist 150' with cues for posture and sequencing for obstacle negotiation Other Treatments:  Pt performs HS isometrics, hip abd/add isometrics, anterior weight shifts, marching, LAQs 2x10. Pt educated on rehab plan and safety in mobility. Pt performs transfers 2x5. Pt performs static standing 30"x2.   See FIM for current functional status  Therapy/Group: Individual Therapy  Monia Pouch 03/30/2015, 11:48 AM

## 2015-03-31 ENCOUNTER — Inpatient Hospital Stay (HOSPITAL_COMMUNITY): Payer: Medicare Other | Admitting: Occupational Therapy

## 2015-03-31 ENCOUNTER — Inpatient Hospital Stay (HOSPITAL_COMMUNITY): Payer: Medicare Other

## 2015-03-31 ENCOUNTER — Inpatient Hospital Stay (HOSPITAL_COMMUNITY): Payer: Medicare Other | Admitting: Speech Pathology

## 2015-03-31 ENCOUNTER — Encounter (HOSPITAL_COMMUNITY): Payer: Medicare Other

## 2015-03-31 LAB — GLUCOSE, CAPILLARY
GLUCOSE-CAPILLARY: 89 mg/dL (ref 70–99)
GLUCOSE-CAPILLARY: 95 mg/dL (ref 70–99)
Glucose-Capillary: 89 mg/dL (ref 70–99)
Glucose-Capillary: 107 mg/dL — ABNORMAL HIGH (ref 70–99)

## 2015-03-31 LAB — PROTIME-INR
INR: 3.16 — ABNORMAL HIGH (ref 0.00–1.49)
Prothrombin Time: 32.7 seconds — ABNORMAL HIGH (ref 11.6–15.2)

## 2015-03-31 NOTE — Progress Notes (Signed)
Speech Language Pathology Discharge Summary  Patient Details  Name: Frank Mclean MRN: [id number] Date of Birth: Oct 15, 1941  Today's Date: 03/31/2015  Patient has met 3 of 3 long term goals.  Patient to discharge at overall Modified Independent;Supervision level.   Reasons goals not met: n/a   Clinical Impression/Discharge Summary: Patient has made functional gains this reporting period and has met 3 out of 3 LTG's due to increased use of swallow strategies, increased participation in dysphagia therapy, and functional change in oral-phrayngeal swallow as demonstrated by upgrade from Dys. 2 to Dys. 3 textures. Currently patient is consuming Dys. 3 textures and thin liquids with minimal overt s/s of aspiration and is utilizing safe swallow strategies with Mod I to Supervision A visual and verbal cues. Patient and family education complete; they verbalized understanding. Recommend patient receive 24-hour supervision and full supervision with meals, with additional skilled SLP follow-up to maximize swallowing functioning to enhance functional independence and reduce caregiver burden.   Care Partner:  Caregiver Able to Provide Assistance: Yes  Type of Caregiver Assistance: Physical;Cognitive  Recommendation:  24 hour supervision/assistance;Home Health SLP  Rationale for SLP Follow Up: Maximize swallowing safety;Reduce caregiver burden   Equipment: n/a   Reasons for discharge: Treatment goals met;Discharged from hospital   Patient/Family Agrees with Progress Made and Goals Achieved: Yes   See FIM for current functional status  Servando Snare 03/31/2015, 4:25 PM

## 2015-03-31 NOTE — Progress Notes (Signed)
Speech Language Pathology Daily Session Note  Patient Details  Name: Frank Mclean MRN: [id number] Date of Birth: 1941-07-13  Today's Date: 03/31/2015 SLP Individual Time: 1130-1200 SLP Individual Time Calculation (min): 30 min  Short Term Goals: Week 2: SLP Short Term Goal 1 (Week 2): short term goals = long term goals  Skilled Therapeutic Interventions: Skilled treatment session focused on dysphagia goals. Upon arrival, patient was upright in wheelchair, alert and agreeable to participate in therapy. Student facilitated session by providing skilled observation of lunchtime meal of Dys. 3 textures and thin liquids, which patient consumed with limited overt s/s of aspiration characterized by immediate throat clear x1. Patient required intiial Supervision A verbal cues for use of swallowing strategies but progressed to overall Mod I through increased demonstration of self-monitoring and use of swallowing strategies as meal progressed. Patient handed off to NT to complete supervision of meal. Continue with current plan of care.   FIM:  Comprehension Comprehension Mode: Auditory Comprehension: 5-Understands basic 90% of the time/requires cueing < 10% of the time Expression Expression Mode: Verbal Expression: 5-Expresses basic 90% of the time/requires cueing < 10% of the time. Social Interaction Social Interaction: 5-Interacts appropriately 90% of the time - Needs monitoring or encouragement for participation or interaction. Problem Solving Problem Solving: 4-Solves basic 75 - 89% of the time/requires cueing 10 - 24% of the time Memory Memory: 4-Recognizes or recalls 75 - 89% of the time/requires cueing 10 - 24% of the time FIM - Eating Eating Activity: 6: More than reasonable amount of time;5: Supervision/cues  Pain Pain Assessment Pain Assessment: No/denies pain  Therapy/Group: Individual Therapy  Tacey Ruiz 03/31/2015, 12:24 PM

## 2015-03-31 NOTE — Discharge Summary (Signed)
Discharge summary job [id number]

## 2015-03-31 NOTE — Discharge Summary (Signed)
Frank Mclean, HITSMAN NO.:  [id number]  MEDICAL RECORD NO.:  [id number]  LOCATION:  4W09C                        FACILITY:  [facility]  PHYSICIAN:  Ranelle Oyster, M.D.DATE OF BIRTH:  1941-03-02  DATE OF ADMISSION:  03/17/2015 DATE OF DISCHARGE:  04/01/2015                              DISCHARGE SUMMARY   DISCHARGE DIAGNOSES: 1. Functional deficits secondary to debilitation related to health     care-associated pneumonia and gait disorder. 2. Chronic Coumadin for atrial fibrillation. 3. Depression. 4. Left bundle-branch block with nonischemic cardiomyopathy. 5. Chronic renal insufficiency, baseline creatinine 2.28 to 2.75. 6. Hypertension. 7. Gastroesophageal reflux disease. 8. Hypothyroidism. 9. Hyperlipidemia. 10.Acute on chronic anemia. 11.Chronic obstructive pulmonary disease. 12.Diabetes mellitus with peripheral neuropathy.  HISTORY OF PRESENT ILLNESS:  This is a 74 year old right-handed male with a history of longstanding nonischemic cardiomyopathy, systolic congestive heart failure, left bundle-branch block with atrial fibrillation, on chronic Coumadin.  The patient lives with his wife. Used a cane and walker prior to admission.  He was recently seen in the office of Cardiology Services for evaluation on need for ICD defibrillator and scheduled.  Recent admit on March 10, 2015, with acute on chronic heart failure, improved with diuresis, as well as attempts of ICD defibrillator implantation, but then able to place LV lead, no appropriate veins and discharged home on March 13, 2015.  Noted increasing shortness of breath over 2 days as well as bouts of vomiting and low-grade fever.  White blood cell count 14,900.  The patient also with recent fall.  He was readmitted for ongoing evaluation on March 14, 2015.  Cranial CT scan negative for acute changes.  CT of the chest with patchy bibasilar or airspace opacities concerning for multifocal pneumonia,  placed on broad-spectrum antibiotics.  He remained on chronic Coumadin therapy.  He was on a dysphagia #2 thin liquid diet, secondary to GERD and advanced as tolerated.  Physical and occupational therapy on going.  The patient was admitted for comprehensive rehab program.  PAST MEDICAL HISTORY:  See discharge diagnoses.  SOCIAL HISTORY:  Lives with his wife, independent with a cane walker prior to admission.  Functional status upon admission to rehab services was total assist, squat pivot transfers; needing assist for overall bed mobility; ambulation yet to be tested; mod to max assist for activities of daily living.  PHYSICAL EXAMINATION:  VITAL SIGNS:  Blood pressure 128/76, pulse 59, temperature 98, respirations 18. GENERAL:  This was an alert male, oriented to person, place, and time. LUNGS:  Decreased breath sounds.  Clear to auscultation. CARDIAC:  Rate controlled. ABDOMEN:  Soft, nontender.  Good bowel sounds.  REHABILITATION HOSPITAL COURSE:  Patient was admitted to inpatient rehab services with therapies initiated on a 3-hour daily basis consisting of physical therapy, occupational therapy, and rehabilitation nursing.  The following issues were addressed during the patient's rehabilitation stay.  Pertaining to [name] debilitation related HCAP with gait disorder.  He continued to participate nicely with therapies needing occasional encouragement to participate with overall improvement in strength and endurance.  He remained on chronic Coumadin for atrial fibrillation.  No bleeding episodes noted.  He would continued to be followed  by the [facility], [phone].  He continued on Lexapro for history of depression, emotional support provided, bouts of lethargy, cranial CT scan showed no acute changes with baseline atrophy. All these issues had been discussed at length with family.  He completed a course of antibiotic therapy for HCAP.  He remained afebrile.  He  did have a long history of left bundle-branch block, nonischemic cardiomyopathy with congestive heart failure, followed by Cardiology Services.  Diuretics adjusted accordingly.  Plan was to follow up Cardiology Services on reference to defibrillator and recent noted unable to place LV lead due to no appropriate veins and planned followup.  Chronic renal insufficiency, baseline creatinine 2.28 to 2.75, close monitoring of electrolytes.  Blood pressures remained well controlled.  He was on mechanical soft diet with history of GERD noted. Blood sugars overall well controlled.  The patient received weekly collaborative interdisciplinary team conferences to discuss estimated length of stay, family teaching, and any barriers to discharge.  The patient ambulating 60 feet with rolling walker, overall minimal guard. Transferred bed-to-wheelchair, stand pivot transfers, rolling walker, minimal guard; propelling his wheelchair, standby assistance.  He could navigate stairs with handrails and minimal guard.  Overall strength and endurance continued to improve.  Multiple family conferences held with family in reference to the patient's overall medical stability and ongoing therapies and all questions addressed.  He was able to gather his simple belongings for activities of daily living.  Seated at edge of bed.  He could bathe upper and lower body with exception of completing peri-care with supervision.  He dressed upper and lower body, seated at edge of bed.  Plan was discharged to home with ongoing therapies dictated per rehab services.  DISCHARGE MEDICATIONS: 1. Norvasc 10 mg p.o. daily. 2. Lipitor 20 mg p.o. daily. 3. Symbicort 160-4.5 mcg 2 puffs twice daily. 4. Coreg 12.5 mg p.o. b.i.d. 5. Vitamin D 1000 units p.o. daily. 6. Lexapro 5 mg p.o. at bedtime. 7. Ferrous sulfate 325 mg p.o. t.i.d. 8. Glucotrol 2.5 mg p.o. b.i.d. 9. Mucinex 600 mg p.o. b.i.d. 10.Hydralazine 100 mg p.o.  t.i.d. 11.Hydrocodone 1 tab every 6 hour as needed moderate pain, dispense of     #90 tablets. 12.Imdur 90 mg p.o. b.i.d. 13.Synthroid 200 mcg p.o. daily. 14.Multivitamin 1 tab daily. 15.Protonix 40 mg p.o. b.i.d. 16.MiraLAX daily, hold for loose stools. 17.Potassium chloride 20 mEq p.o. daily. 18.Demadex 10 mg p.o. daily. 19.Vitamin B12 at 1000 mcg p.o. daily. 20.Coumadin 2.5 mg daily on Monday and Friday; 5 mg on Sunday,     Tuesday, Wednesday, Thursday, Saturday.  DIET:  Mechanical soft.  SPECIAL INSTRUCTIONS:  The patient would follow up with Dr. Faith Rogue at the outpatient rehab service office as needed; Dr. Lewayne Bunting, call for appointment; Dr. Evelene Croon, call for appointment in reference to LV leads for defibrillator; Dr. Posey Rea, Medical Management.  Home health nurse had been arranged for weekly prothrombin times while maintained on chronic Coumadin, results to [facility], [phone], fax [phone].     Mariam Dollar, P.A.   ______________________________ Ranelle Oyster, M.D.    DA/MEDQ  D:  03/31/2015  T:  03/31/2015  Job:  [id number]  cc:   Doylene Canning. Ladona Ridgel, MD Evelene Croon, M.D. Georgina Quint. Plotnikov, MD

## 2015-03-31 NOTE — Progress Notes (Signed)
Physical Therapy Session Note  Patient Details  Name: Frank Mclean MRN: [id number] Date of Birth: 1941-12-14  Today's Date: 03/31/2015 PT Individual Time: 1000-1100 PT Individual Time Calculation (min): 60 min   Short Term Goals: Week 2:  PT Short Term Goal 1 (Week 2): = LTGs  Skilled Therapeutic Interventions/Progress Updates:    Session focused on functional transfers with RW at overall min A level, gait with RW in controlled and home environment with steady A and cues for posture and safety intermittently, stair negotiation to practice home entry up/down 8 steps with min A and verbal cues for which foot to lead with, w/c mobility for functional strengthening and endurance, and family education with pt's brother in law, [name] in regards to bed mobility and transfers in ADL apartment including bed, furniture, and toilet, and return demonstration for stair negotiation. [name] denies any concerns in regards to discharge tomorrow and states that he hopes the patient can stay at this level. Educated that this therapist still recommends close S to min A for all mobility due to fluctuating levels of assist throughout the day. Pt performed transfers today at close S to min A level except light mod A needed from low couch. Also educated on recommendation for use of BSC over toilet for ability to use UE's to aid with transfers as their set-up does not allow for pt to have anything to push up from.   Therapy Documentation Precautions:  Precautions Precautions: Fall, ICD/Pacemaker Precaution Comments: will need sx again to complete pacemaker placement Restrictions Weight Bearing Restrictions: No  Pain: 5/10 pain in R knee - RN notified and administered pain medication.  See FIM for current functional status  Therapy/Group: Individual Therapy  Karolee Stamps Darrol Poke, PT, DPT  03/31/2015, 12:10 PM

## 2015-03-31 NOTE — Progress Notes (Signed)
PHYSICAL MEDICINE & REHABILITATION     PROGRESS NOTE    Subjective/Complaints: Feeling well. Thinks that stamina is better.   Review of systems negative except as above   Objective: Vital Signs: Blood pressure 139/78, pulse 57, temperature 97.8 F (36.6 C), temperature source Oral, resp. rate 18, weight 99.02 kg (218 lb 4.8 oz), SpO2 99 %. No results found. No results for input(s): WBC, HGB, HCT, PLT in the last 72 hours. No results for input(s): NA, K, CL, GLUCOSE, BUN, CREATININE, CALCIUM in the last 72 hours.  Invalid input(s): CO CBG (last 3)   Recent Labs  03/30/15 1643 03/30/15 2056 03/31/15 0639  GLUCAP 115* 115* 89    Wt Readings from Last 3 Encounters:  03/31/15 99.02 kg (218 lb 4.8 oz)  03/14/15 95.5 kg (210 lb 8.6 oz)  03/13/15 92.08 kg (203 lb)    Physical Exam:  Constitutional: He is oriented to person, place, and time. He appears well-developed.  HENT: PERRL. Head: Normocephalic.  Eyes: EOM are normal.  Neck: Normal range of motion. Neck supple. No thyromegaly present.  Cardiovascular:  Cardiac rate controlled without murmur. No edema appreciated Respiratory:  Lungs decreased breath sounds at the bases.  GI: Soft. Bowel sounds are normal. He exhibits no distension.  Neurological: He is alert and oriented to person, place, and time.  Follow simple commands. Moves all 4's.3-4/5 UE's and 2+ hf, 3 ke and 4/5 ankles  Skin: Skin is warm and dry.     Assessment/Plan: 1. Functional deficits secondary to debility and gait disorder which require 3+ hours per day of interdisciplinary therapy in a comprehensive inpatient rehab setting. Physiatrist is providing close team supervision and 24 hour management of active medical problems listed below. Physiatrist and rehab team continue to assess barriers to discharge/monitor patient progress toward functional and medical goals.     FIM: FIM - Bathing Bathing Steps Patient Completed: Chest,  Right Arm, Left Arm, Abdomen, Right upper leg, Left upper leg, Right lower leg (including foot), Left lower leg (including foot) Bathing: 5: Supervision: Safety issues/verbal cues  FIM - Upper Body Dressing/Undressing Upper body dressing/undressing steps patient completed: Thread/unthread left sleeve of pullover shirt/dress, Put head through opening of pull over shirt/dress, Pull shirt over trunk, Thread/unthread right sleeve of pullover shirt/dresss Upper body dressing/undressing: 3: Mod-Patient completed 50-74% of tasks FIM - Lower Body Dressing/Undressing Lower body dressing/undressing steps patient completed: Thread/unthread right pants leg, Thread/unthread left pants leg, Pull pants up/down Lower body dressing/undressing: 4: Steadying Assist  FIM - Toileting Toileting steps completed by patient: Performs perineal hygiene Toileting Assistive Devices: Grab bar or rail for support Toileting: 2: Max-Patient completed 1 of 3 steps  FIM - Diplomatic Services operational officer Devices: Environmental consultant, Therapist, music Transfers: 4-To toilet/BSC: Min A (steadying Pt. > 75%), 4-From toilet/BSC: Min A (steadying Pt. > 75%)  FIM - Bed/Chair Transfer Bed/Chair Transfer Assistive Devices: Bed rails Bed/Chair Transfer: 5: Supine > Sit: Supervision (verbal cues/safety issues)  FIM - Locomotion: Wheelchair Distance: 75 Locomotion: Wheelchair: 2: Travels 50 - 149 ft with supervision, cueing or coaxing FIM - Locomotion: Ambulation Locomotion: Ambulation Assistive Devices: Designer, industrial/product Ambulation/Gait Assistance: 4: Min guard Locomotion: Ambulation: 2: Travels 50 - 149 ft with minimal assistance (Pt.>75%)  Comprehension Comprehension Mode: Auditory Comprehension: 5-Understands basic 90% of the time/requires cueing < 10% of the time  Expression Expression Mode: Verbal Expression: 5-Expresses basic 90% of the time/requires cueing < 10% of the time.  Social Interaction Social Interaction:  5-Interacts appropriately 90% of the time - Needs monitoring or encouragement for participation or interaction.  Problem Solving Problem Solving: 4-Solves basic 75 - 89% of the time/requires cueing 10 - 24% of the time  Memory Memory: 4-Recognizes or recalls 75 - 89% of the time/requires cueing 10 - 24% of the time  Medical Problem List and Plan: 1. Functional deficits secondary to Debility /HCAP/Gait disorder 2. DVT Prophylaxis/Anticoagulation: Chronic coumadin/A fib. 3. Pain Management: Hydrocodone as needed. 4. Mood: Lexapro 5 mg qhs  -CT with baseline atrophy 5. Neuropsych: This patient is capable of making decisions on her own behalf.  -unresponsive episode earlier this week--- ?bp related.  No indications of a seizure 6. Skin/Wound Care: Routine skin checks 7. Fluids/Electrolytes/Nutrition: replace K+ 8.ID/HCAP. Augmentin completed.  -wbc's  decreased)  -IS, FV, OOB  -UA equivocal, culture negative, he's afebrile 9.LBBB./non ischemic cardiomyopathy.Follow up cardiology on defibrillator in reference to being unable to place LV lead due to no appropriate veins and plan follow-up  -net I/O's negative, cxr with interstitial edema  -appreciate cards help  -torsemide increased to  bid  -weights daily  -  orthostatic VS--negative--dc 10.CRI.Baseline Creatinine 2.28-2.75 at baseline 11. Hypertension. Norvasc 10 mg daily, Coreg 12.5 mg twice a day, hydralazine 100 mg 3 times a day, Imdur 90 mg twice a day, Demadex 10 mg daily.  -bp's have been better 12. GERD versus esophageal dysmotility Dysphagia 2 thin liquid diet. Advance diet as tolerated 13. Hypothyroidism. Synthroid 14. Hyperlipidemia. Lipitor 15. Acute on chronic anemia. Continue iron supplement. Follow-up hgb 8.8 16. COPD. Symbicort,  mucinex bid 17. Diabetes: resumed glipizide 2.5mg  bid, SSI  -controlled   LOS (Days) 14 A FACE TO FACE EVALUATION WAS PERFORMED  Frank Mclean,Frank Mclean 03/31/2015 8:22 AM

## 2015-03-31 NOTE — Progress Notes (Signed)
Occupational Therapy Session Note  Patient Details  Name: Frank Mclean MRN: [id number] Date of Birth: 1941/03/21  Today's Date: 03/31/2015 OT Individual Time: 1345-1445 OT Individual Time Calculation (min): 60 min    Short Term Goals: Week 2:  OT Short Term Goal 1 (Week 2): STG= LTG due to estimated LOS  Skilled Therapeutic Interventions/Progress Updates:    Pt seen for OT session focusing on functional mobility, UE strengthening and transfers and functional activity tolerance. Pt sitting up in w/c upon arrival, agreeable to tx. Pt self-propelled w/c ~50 yards towards OT apartment, total A to get remainder of way due to fatigue.He required min A and VCs for technique to stand from w/c. In Apartment, pt ambulated throughout room with RW and CGA. [name] Pt completed bed mobility on standard bed with mod I. He ambulated into bathroom and completed toilet transfers and toileting task with supervision, using lateral leans to complete hygiene. He stood at the sink to wash hands following toileting tasks.  Pt returned to room for thorough hygiene of buttock. Pt stood at sink, and completed hygiene with steadying assist. Pt then self-propelled w/c to therapy day room where he stood to water flowers with steadying assist, requiring min VC and tactile cues for RW management, tolerating ~5 minutes of static/dynamic standing task. Pt then returned to room, propelled ~25 yards before fatiguing and requesting assist to return to room. Pt left sitting in w/c at end of session, all needs in reach.   Education provided regarding use of 3-1 BSC, energy conservation, need for assist, benefits of OOB, and d/c planning.   Therapy Documentation Precautions:  Precautions Precautions: Fall, ICD/Pacemaker Precaution Comments: will need sx again to complete pacemaker placement Required Braces or Orthoses: Sling Restrictions Weight Bearing Restrictions: No Other Position/Activity Restrictions: L arm in sling Pain: Pain  Assessment Pain Assessment: No/denies pain  See FIM for current functional status  Therapy/Group: Individual Therapy  [name], Naidelyn Parrella C 03/31/2015, 3:43 PM

## 2015-03-31 NOTE — Progress Notes (Signed)
ANTICOAGULATION CONSULT NOTE - Follow Up Consult  Pharmacy Consult for coumadin Indication: atrial fibrillation  Allergies  Allergen Reactions  . Levothyroxine Sodium Other (See Comments)    MUST TAKE BRAND NAME GENERIC DOESN'T WORK FOR PATIENT  . Chicken Protein     Does not like chicken or [place]  . Eggs Or Egg-Derived Products     Does not like eggs    Patient Measurements: Weight: 217 lb (98.431 kg)  Vital Signs: Temp: 98 F (36.7 C) (04/11 1537) Temp Source: Oral (04/11 1537) BP: 125/70 mmHg (04/11 1537) Pulse Rate: 55 (04/11 1537)  Labs:  Recent Labs  03/31/15 1605  LABPROT 32.7*  INR 3.16*    Estimated Creatinine Clearance: 36.2 mL/min (by C-G formula based on Cr of 2.05).   Medications:  Scheduled:  . amLODipine  10 mg Oral Daily  . atorvastatin  20 mg Oral Daily  . budesonide-formoterol  2 puff Inhalation BID  . carvedilol  12.5 mg Oral BID WC  . cholecalciferol  1,000 Units Oral Daily  . escitalopram  5 mg Oral QHS  . feeding supplement (GLUCERNA SHAKE)  237 mL Oral BID BM  . ferrous sulfate  325 mg Oral TID WC  . glipiZIDE  2.5 mg Oral BID AC  . guaiFENesin  600 mg Oral BID  . hydrALAZINE  100 mg Oral TID  . insulin aspart  0-9 Units Subcutaneous TID WC  . isosorbide mononitrate  90 mg Oral BID  . levothyroxine  200 mcg Oral QAC breakfast  . multivitamin with minerals  1 tablet Oral Daily  . nystatin  5 mL Oral QID  . pantoprazole  40 mg Oral BID  . polyethylene glycol  17 g Oral Daily  . potassium chloride  20 mEq Oral Daily  . torsemide  10 mg Oral Daily  . torsemide  20 mg Oral Once  . cyanocobalamin  1,000 mcg Oral Daily  . warfarin  2.5 mg Oral Once per day on Mon Fri  . warfarin  5 mg Oral Once per day on Sun Tue Wed Thu Sat  . Warfarin - Pharmacist Dosing Inpatient   Does not apply q1800   Infusions:    Assessment: 74 yo male continues on coumadin for afib. INR today up to 3.16 - INR trending up on 5mg  daily except for 2.5mg  on  Mon and Fri (may need 2.5mg  3x/week). No bleeding noted.    Goal of Therapy:  INR 2-3 Monitor platelets by anticoagulation protocol: Yes   Plan:  No coumadin today. Will d/c current scheduled coumadin. Change back to daily INR  Christoper Fabian, PharmD, BCPS Clinical pharmacist, pager [phone] 03/31/2015,5:04 PM

## 2015-03-31 NOTE — Progress Notes (Signed)
NUTRITION FOLLOW UP  INTERVENTION: Provide Glucerna Shake po BID, each supplement provides 220 kcal and 10 grams of protein  Encourage adequate PO intake.  NUTRITION DIAGNOSIS: Increased nutrient needs related to therapy as evidenced by estimated nutrition needs; ongoing  Goal: Pt to meet >/= 90% of their estimated nutrition needs;,met  Monitor:  PO intake, weight trends, labs, I/O's  74 y.o. male  Admitting Dx: Debility  ASSESSMENT: Pt with history of chronic renal insufficiency, long-standing nonischemic cardiomyopathy, systolic congestive heart failure, left bundle branch block, atrial fibrillation, DM. CT of the chest with patchy bibasal or air space opacities concerning for multifocal pneumonia. Cardiology to follow-up on current plan for defibrillator.  Pt reports having a good appetite. Meal completion has been 50-100%. Pt has been consuming his Glucerna Shake. Plans for discharge tomorrow.   Labs and medications reviewed.  Height: Ht Readings from Last 1 Encounters:  03/14/15 _0  (1.88 m)    Weight: Wt Readings from Last 1 Encounters:  03/31/15 217 lb (98.431 kg)    BMI:  Body mass index is 27.85 kg/(m^2).  Re-Estimated Nutritional Needs: Kcal: 2100-2400 Protein: 110-120 grams Fluid: 2.2 - 2.4 L/day  Skin: Incision on left chest, non-pitting LE edema  Diet Order: DIET DYS 3 Room service appropriate?: Yes; Fluid consistency:: Thin   Intake/Output Summary (Last 24 hours) at 03/31/15 1336 Last data filed at 03/31/15 1208  Gross per 24 hour  Intake    840 ml  Output   1250 ml  Net   -410 ml    Last BM: 4/10 Labs:   Recent Labs Lab 03/25/15 1628 03/27/15 0624  NA 145 145  K 3.8 3.3*  CL 108 110  CO2 28 27  BUN 28* 18  CREATININE 2.34* 2.05*  CALCIUM 8.4 8.4  GLUCOSE 111* 77    CBG (last 3)   Recent Labs  03/30/15 2056 03/31/15 0639 03/31/15 1133  GLUCAP 115* 89 107*    Scheduled Meds: . amLODipine  10 mg Oral Daily  .  atorvastatin  20 mg Oral Daily  . budesonide-formoterol  2 puff Inhalation BID  . carvedilol  12.5 mg Oral BID WC  . cholecalciferol  1,000 Units Oral Daily  . escitalopram  5 mg Oral QHS  . feeding supplement (GLUCERNA SHAKE)  237 mL Oral BID BM  . ferrous sulfate  325 mg Oral TID WC  . glipiZIDE  2.5 mg Oral BID AC  . guaiFENesin  600 mg Oral BID  . hydrALAZINE  100 mg Oral TID  . insulin aspart  0-9 Units Subcutaneous TID WC  . isosorbide mononitrate  90 mg Oral BID  . levothyroxine  200 mcg Oral QAC breakfast  . multivitamin with minerals  1 tablet Oral Daily  . nystatin  5 mL Oral QID  . pantoprazole  40 mg Oral BID  . polyethylene glycol  17 g Oral Daily  . potassium chloride  20 mEq Oral Daily  . torsemide  10 mg Oral Daily  . torsemide  20 mg Oral Once  . cyanocobalamin  1,000 mcg Oral Daily  . warfarin  2.5 mg Oral Once per day on Mon Fri  . warfarin  5 mg Oral Once per day on Sun Tue Wed Thu Sat  . Warfarin - Pharmacist Dosing Inpatient   Does not apply q1800    Continuous Infusions:   Past Medical History  Diagnosis Date  . Gout   . HTN (hypertension)   . Hyperlipidemia   . Osteoarthritis   .  History of CVA (cerebrovascular accident)     Left pontine infarct July 2004; changed from Plavix to Coumadin in 2004 per MD at [facility]  . GERD (gastroesophageal reflux disease)   . DJD (degenerative joint disease)   . BPH (benign prostatic hypertrophy)   . Peripheral vascular disease   . Bilateral leg ulcer     ACHILLES AREA--  NONHEALING  . CKD (chronic kidney disease) stage 3, GFR 30-59 ml/min   . CAD (coronary artery disease)     a. LHC 4/12: Mid LAD 25%, mid diagonal 30%, AV circumflex 40%, proximal OM 25%, distal RCA 40%  . Chronic combined systolic and diastolic heart failure     a. Echo 05/2013: EF 25-30%, diffuse HK, restrictive physiology, trivial AI, mild MR, moderate LAE, reduced RV systolic function, PASP 42  . LBBB (left bundle branch block)   . PAF  (paroxysmal atrial fibrillation)     a. amiodarone Rx started 05/2013;  b. chronic coumadin  . NICM (nonischemic cardiomyopathy)     a. EF 25-30%.  [name] Hx of cardiovascular stress test     Nuclear Stress Test (1/16): High risk stress nuclear study with a large, severe, partially reversible inferior and apical defect consistent with prior inferior and apical infarct; mild apical ischemia; severe LVE; study high risk due to reduced LV function.  EF 25% >> reviewed with Dr. Percival Spanish >> medical mgmt  . Hypothyroidism   . DM (diabetes mellitus), type 2     Past Surgical History  Procedure Laterality Date  . Cardiac catheterization  04-16-2011   DR [facility]    NON-OBSTRUCTIVE CAD. MILDLY ELEVATED PULMONARY PRESSURES/ ELEVATED  END-DIASTOLIC PRESSURE  . Transthoracic echocardiogram  12-31-2010    MODERATE CONCENTRIC LVH/ SYSTOLIC FUNCTION SEVERELY REDUCED/ EF 25-30%/  SEVERE HYPOKINESIS OF ANTEROSEPTAL MYOCARDIUM  AND ENTIREAPICAL MYOCARDIUM /  MODERATE HYPOKINESIS OF LATERAL, INFEROLATERAL, INFERIOR,AND INFEROSEPTAL MYOCARIUM/  GRADE 3 DIASTOLIC DYSFUNCTION/ MILD MR  . Aortogram w/ bilateral lower extremitiy runoff  05-18-2013  DR FIELDS    LEFT LEG OCCLUDED PERONEAL AND ANTERIOR TIBIAL ARTERIES/ HIGH GRADE STENOIS 80% MIDDLE AND DISTAL THIRD OF POSTERIOR TIBIAL ARTERY/ RIGHT PERONEAL AND ANTERIOR TIBIAL ARTERY OCCLUDED/ 40% STENOSIS DISTALLY   . Abdominal aortagram N/A 05/18/2013    Procedure: ABDOMINAL Maxcine Ham;  Surgeon: Elam Dutch, MD;  Location: [facility] CATH LAB;  Service: Cardiovascular;  Laterality: N/A;  . Lower extremity angiogram Bilateral 05/18/2013    Procedure: LOWER EXTREMITY ANGIOGRAM;  Surgeon: Elam Dutch, MD;  Location: [facility] CATH LAB;  Service: Cardiovascular;  Laterality: Bilateral;  . Right heart catheterization N/A 02/13/2015    Procedure: RIGHT HEART CATH;  Surgeon: Larey Dresser, MD;  Location: [facility] CATH LAB;  Service: Cardiovascular;  Laterality: N/A;  . Bi-ventricular  implantable cardioverter defibrillator N/A 03/12/2015    Procedure: BI-VENTRICULAR IMPLANTABLE CARDIOVERTER DEFIBRILLATOR  (CRT-D);  Surgeon: Evans Lance, MD;  Location: [facility] CATH LAB;  Service: Cardiovascular;  Laterality: N/A;    Kallie Locks, MS, RD, LDN Pager # [phone] After hours/ weekend pager # [phone]

## 2015-03-31 NOTE — Progress Notes (Signed)
Occupational Therapy Session Note  Patient Details  Name: Frank Mclean MRN: [id number] Date of Birth: 1941-04-16  Today's Date: 03/31/2015 OT Individual Time: 9629-5284 OT Individual Time Calculation (min): 45 min    Short Term Goals: Week 2:  OT Short Term Goal 1 (Week 2): STG= LTG due to estimated LOS  Skilled Therapeutic Interventions/Progress Updates:    Pt seen for ADL bathing and dressing task. Pt in supine upon arrival, agreeable to tx. Pt transferred supine> EOB with use of 1 bed rail and HOB flat. Pt ambulated throughout room with RW and CGA. He completed toileting task, toilet transfers, and shower transfers with supervision. He competed seated showering task with supervision, using lateral leans to complete buttock hygiene. Pt dressed UB and LB from w/c level with set-up, and stood at RW to pull underwear/ pants up. Pt donned B socks with supervision. Pt left set-up at the sink completing oral care from w/c level. NT made aware.   Education provided regarding need for assist, importance of participation with ADLs, benefits of OOB, and d/c planning.   Therapy Documentation Precautions:  Precautions Precautions: Fall, ICD/Pacemaker Precaution Comments: will need sx again to complete pacemaker placement Required Braces or Orthoses: Sling Restrictions Weight Bearing Restrictions: No Other Position/Activity Restrictions: L arm in sling Pain: Pain Assessment Pain Assessment: No/denies pain  See FIM for current functional status  Therapy/Group: Individual Therapy  [name], Jamayia Croker C 03/31/2015, 7:48 AM

## 2015-03-31 NOTE — Progress Notes (Signed)
Occupational Therapy Discharge Summary  Patient Details  Name: Frank Mclean MRN: [id number] Date of Birth: 1941-08-05  Patient has met 7 of 7 long term goals due to improved activity tolerance, improved balance, postural control and improved attention.  Patient to discharge at [facility] Assist level.  Patient's care partner is independent to provide the necessary physical and cognitive assistance at discharge.    Pt with varying levels of assist required upon d/Mclean depending on level of fatigue and cognition. Caregivers have been made aware of pt's fluctuating physical and cognitive abilities and have voiced understanding and willingness to provide required amount of assist.    Recommendation:  Patient will benefit from ongoing skilled OT services in home health setting to continue to advance functional skills in the area of BADL and Reduce care partner burden.  Equipment: 3-1 BSC  Reasons for discharge: treatment goals met and discharge from hospital  Patient/family agrees with progress made and goals achieved: Yes  OT Discharge Precautions/Restrictions  Precautions Precautions: Fall;ICD/Pacemaker Precaution Comments: will need sx again to complete pacemaker placement Restrictions Weight Bearing Restrictions: No Vision/Perception  Vision- History Baseline Vision/History: Wears glasses Wears Glasses: Reading only Patient Visual Report: No change from baseline Vision- Assessment Vision Assessment?: No apparent visual deficits  Cognition Overall Cognitive Status: Impaired/Different from baseline Arousal/Alertness: Awake/alert Orientation Level: Oriented X4 Memory: Impaired Memory Impairment: Decreased short term memory Decreased Short Term Memory: Verbal complex;Functional complex Awareness: Impaired Awareness Impairment: Emergent impairment Problem Solving: Impaired Problem Solving Impairment: Functional complex;Verbal complex Safety/Judgment: Impaired Comments: Cognition  fluctuates especially with fatigue. Pt requires cues for safety with mobility during these fluctuations.  Sensation Sensation Light Touch: Appears Intact Proprioception: Appears Intact Coordination Gross Motor Movements are Fluid and Coordinated: Yes Fine Motor Movements are Fluid and Coordinated: Yes Motor  Motor Motor: Other (comment) Motor - Discharge Observations: generalized weakness; RLE limited by pain in knee due to gout at times Mobility  Transfers Transfers: Sit to Stand;Stand to Sit Sit to Stand: 4: Min assist Sit to Stand Details: Manual facilitation for weight shifting;Verbal cues for technique Sit to Stand Details (indicate cue type and reason): level of assist dependent upon sitting surface and level of fatigue.  VCs for hand placement on RW Stand to Sit: 4: Min assist Stand to Sit Details (indicate cue type and reason): Tactile cues for weight shifting  Trunk/Postural Assessment  Cervical Assessment Cervical Assessment: Within Functional Limits Thoracic Assessment Thoracic Assessment:  (Kyphotic posture) Lumbar Assessment Lumbar Assessment:  (Kyphotic posture) Postural Control Postural Control: Deficits on evaluation (Posterior tilt when fatigued)  Balance Balance Balance Assessed: Yes Static Sitting Balance Static Sitting - Level of Assistance: 6: Modified independent (Device/Increase time) Dynamic Sitting Balance Dynamic Sitting - Level of Assistance: 5: Stand by assistance;6: Modified independent (Device/Increase time) Sitting balance - Comments: Sitting EOB to complete functional tasks Static Standing Balance Static Standing - Level of Assistance: 4: Min assist;5: Stand by assistance Dynamic Standing Balance Dynamic Standing - Level of Assistance: 4: Min assist;5: Stand by assistance Extremity/Trunk Assessment RUE Assessment RUE Assessment: Within Functional Limits LUE Assessment LUE Assessment: Within Functional Limits  See FIM for current  functional status  Frank Mclean, Frank Mclean 03/31/2015, 4:07 PM

## 2015-03-31 NOTE — Progress Notes (Signed)
Physical Therapy Discharge Summary  Patient Details  Name: Frank Mclean MRN: [id number] Date of Birth: 07/24/1941  Patient has met 8 of 8 long term goals due to improved activity tolerance, improved balance, improved postural control, decreased pain and ability to compensate for deficits.  Patient to discharge at an ambulatory level [place] with RW.  Patient's wife and brother-in-law went through family education and are able to provide the necessary physical assistance at discharge. They deny any further concerns.   Reasons goals not met: n/a - all goals met at this time.  Recommendation:  Patient will benefit from ongoing skilled PT services in home health setting to continue to advance safe functional mobility, address ongoing impairments in gait, balance, strength, endurance, cognition, functional mobility, and minimize fall risk.  Equipment: 18x18 w/c with basic cushion. Pt owns RW.  Reasons for discharge: treatment goals met and discharge from hospital  Patient/family agrees with progress made and goals achieved: Yes  PT Discharge Precautions/Restrictions Precautions Precautions: Fall;ICD/Pacemaker Precaution Comments: will need sx again to complete pacemaker placement Restrictions Weight Bearing Restrictions: No Cognition Orientation Level: Oriented X4 Comments: Cognition fluctuates especially with fatigue. Pt requires cues for safety with mobility during these fluctuations.  Sensation Sensation Light Touch: Appears Intact Proprioception: Appears Intact Coordination Gross Motor Movements are Fluid and Coordinated: Yes Motor  Motor Motor: Other (comment) Motor - Discharge Observations: generalized weakness; RLE limited by pain in knee due to gout at times  Locomotion  Ambulation Ambulation/Gait Assistance: 4: Min guard  Trunk/Postural Assessment  Cervical Assessment Cervical Assessment: Within Functional Limits Thoracic Assessment Thoracic Assessment:  (kyphotic  posture) Lumbar Assessment Lumbar Assessment:  (posterior pelvic tilt) Postural Control Postural Control: Deficits on evaluation (still with posterior bias at times; min A for balance at tim)  Balance Balance Balance Assessed: Yes Static Sitting Balance Static Sitting - Level of Assistance: 6: Modified independent (Device/Increase time) Dynamic Sitting Balance Dynamic Sitting - Level of Assistance: 5: Stand by assistance;6: Modified independent (Device/Increase time) Static Standing Balance Static Standing - Level of Assistance: 4: Min assist;5: Stand by assistance (with UE support) Dynamic Standing Balance Dynamic Standing - Level of Assistance: 4: Min assist;5: Stand by assistance (with UE support) Extremity Assessment      RLE Assessment RLE Assessment: Exceptions to [facility] (limited by R knee pain; 3 to 3+/5 grossly) LLE Assessment LLE Assessment: Exceptions to [facility] (4/5 grossly)  See FIM for current functional status  [name], PT, DPT  03/31/2015, 3:32 PM

## 2015-04-01 ENCOUNTER — Telehealth: Payer: Self-pay | Admitting: *Deleted

## 2015-04-01 LAB — GLUCOSE, CAPILLARY: GLUCOSE-CAPILLARY: 82 mg/dL (ref 70–99)

## 2015-04-01 LAB — PROTIME-INR
INR: 3.07 — ABNORMAL HIGH (ref 0.00–1.49)
Prothrombin Time: 32 seconds — ABNORMAL HIGH (ref 11.6–15.2)

## 2015-04-01 MED ORDER — HYDRALAZINE HCL 100 MG PO TABS: 100.0000 mg | ORAL_TABLET | Freq: Three times a day (TID) | ORAL | Status: DC

## 2015-04-01 MED ORDER — HYDROCODONE-ACETAMINOPHEN 5-325 MG PO TABS: 1.0000 | ORAL_TABLET | Freq: Four times a day (QID) | ORAL | Status: DC | PRN

## 2015-04-01 MED ORDER — VITAMIN D 1000 UNITS PO TABS: 1000.0000 [IU] | ORAL_TABLET | Freq: Every day | ORAL | Status: DC

## 2015-04-01 MED ORDER — GUAIFENESIN ER 600 MG PO TB12: 600.0000 mg | ORAL_TABLET | Freq: Two times a day (BID) | ORAL | Status: DC

## 2015-04-01 MED ORDER — ADULT MULTIVITAMIN W/MINERALS CH: 1.0000 | ORAL_TABLET | Freq: Every day | ORAL | Status: DC

## 2015-04-01 MED ORDER — WARFARIN SODIUM 5 MG PO TABS
5.0000 mg | ORAL_TABLET | Freq: Every day | ORAL | Status: DC
  Filled 2015-04-01: qty 1

## 2015-04-01 MED ORDER — TORSEMIDE 10 MG PO TABS: 10.0000 mg | ORAL_TABLET | Freq: Every day | ORAL | Status: DC

## 2015-04-01 MED ORDER — POLYETHYLENE GLYCOL 3350 17 G PO PACK: 17.0000 g | PACK | Freq: Every day | ORAL | Status: DC

## 2015-04-01 MED ORDER — ISOSORBIDE MONONITRATE ER 60 MG PO TB24: ORAL_TABLET | ORAL | Status: DC

## 2015-04-01 MED ORDER — AMLODIPINE BESYLATE 10 MG PO TABS
10.0000 mg | ORAL_TABLET | Freq: Every day | ORAL | Status: DC
Start: 2015-04-01 — End: 2015-06-13

## 2015-04-01 MED ORDER — ESCITALOPRAM OXALATE 5 MG PO TABS: 5.0000 mg | ORAL_TABLET | Freq: Every day | ORAL | Status: DC

## 2015-04-01 MED ORDER — ATORVASTATIN CALCIUM 20 MG PO TABS: 20.0000 mg | ORAL_TABLET | Freq: Every day | ORAL | Status: DC

## 2015-04-01 MED ORDER — CARVEDILOL 12.5 MG PO TABS: 12.5000 mg | ORAL_TABLET | Freq: Two times a day (BID) | ORAL | Status: DC

## 2015-04-01 MED ORDER — FERROUS SULFATE 325 (65 FE) MG PO TABS: 325.0000 mg | ORAL_TABLET | Freq: Every day | ORAL | Status: DC

## 2015-04-01 MED ORDER — POTASSIUM CHLORIDE CRYS ER 20 MEQ PO TBCR: 20.0000 meq | EXTENDED_RELEASE_TABLET | Freq: Every day | ORAL | Status: DC

## 2015-04-01 MED ORDER — PANTOPRAZOLE SODIUM 40 MG PO TBEC: 40.0000 mg | DELAYED_RELEASE_TABLET | Freq: Two times a day (BID) | ORAL | Status: DC

## 2015-04-01 MED ORDER — WARFARIN SODIUM 5 MG PO TABS: ORAL_TABLET | ORAL | Status: DC

## 2015-04-01 NOTE — Progress Notes (Signed)
[place] PHYSICAL MEDICINE & REHABILITATION     PROGRESS NOTE    Subjective/Complaints: No new complaints. Excited to go home.   Review of systems negative except as above   Objective: Vital Signs: Blood pressure 125/68, pulse 60, temperature 98.5 F (36.9 C), temperature source Oral, resp. rate 16, weight 97.796 kg (215 lb 9.6 oz), SpO2 98 %. No results found. No results for input(s): WBC, HGB, HCT, PLT in the last 72 hours. No results for input(s): NA, K, CL, GLUCOSE, BUN, CREATININE, CALCIUM in the last 72 hours.  Invalid input(s): CO CBG (last 3)   Recent Labs  03/31/15 1623 03/31/15 2128 04/01/15 0631  GLUCAP 89 95 82    Wt Readings from Last 3 Encounters:  04/01/15 97.796 kg (215 lb 9.6 oz)  03/14/15 95.5 kg (210 lb 8.6 oz)  03/13/15 92.08 kg (203 lb)    Physical Exam:  Constitutional: He is oriented to person, place, and time. He appears well-developed.  HENT: PERRL. Head: Normocephalic.  Eyes: EOM are normal.  Neck: Normal range of motion. Neck supple. No thyromegaly present.  Cardiovascular:  Cardiac rate controlled without murmur. No edema appreciated Respiratory:  Lungs decreased breath sounds at the bases.  GI: Soft. Bowel sounds are normal. He exhibits no distension.  Neurological: He is alert and oriented to person, place, and time.  Follow simple commands. Moves all 4's.3-4/5 UE's and 2+ hf, 3 ke and 4/5 ankles  Skin: Skin is warm and dry.     Assessment/Plan: 1. Functional deficits secondary to debility and gait disorder which require 3+ hours per day of interdisciplinary therapy in a comprehensive inpatient rehab setting. Physiatrist is providing close team supervision and 24 hour management of active medical problems listed below. Physiatrist and rehab team continue to assess barriers to discharge/monitor patient progress toward functional and medical goals.  Dc home today. Goals met. Family ed. Follow up [place]  FIM: FIM -  Bathing Bathing Steps Patient Completed: Chest, Right Arm, Left Arm, Abdomen, Right upper leg, Left upper leg, Right lower leg (including foot), Left lower leg (including foot), Front perineal area, Buttocks Bathing: 5: Supervision: Safety issues/verbal cues  FIM - Upper Body Dressing/Undressing Upper body dressing/undressing steps patient completed: Thread/unthread left sleeve of pullover shirt/dress, Put head through opening of pull over shirt/dress, Pull shirt over trunk, Thread/unthread right sleeve of pullover shirt/dresss Upper body dressing/undressing: 5: Set-up assist to: Obtain clothing/put away FIM - Lower Body Dressing/Undressing Lower body dressing/undressing steps patient completed: Thread/unthread right pants leg, Thread/unthread left pants leg, Pull pants up/down, Pull underwear up/down, Thread/unthread right underwear leg, Thread/unthread left underwear leg, Don/Doff right sock, Don/Doff left sock Lower body dressing/undressing: 4: Steadying Assist  FIM - Toileting Toileting steps completed by patient: Adjust clothing after toileting, Adjust clothing prior to toileting, Performs perineal hygiene Toileting Assistive Devices: Grab bar or rail for support Toileting: 4: Steadying assist  FIM - Radio producer Devices: Environmental consultant, Product manager Transfers: 5-From toilet/BSC: Supervision (verbal cues/safety issues), 5-To toilet/BSC: Supervision (verbal cues/safety issues)  FIM - Control and instrumentation engineer Devices: Walker, Arm rests Bed/Chair Transfer: 6: Sit > Supine: No assist, 6: Supine > Sit: No assist, 4: Chair or W/C > Bed: Min A (steadying Pt. > 75%), 4: Bed > Chair or W/C: Min A (steadying Pt. > 75%)  FIM - Locomotion: Wheelchair Distance: 75 Locomotion: Wheelchair: 5: Travels 150 ft or more: maneuvers on rugs and over door sills with supervision, cueing or coaxing FIM - Locomotion:  Ambulation Locomotion: Ambulation Assistive  Devices: Walker - Rolling Ambulation/Gait Assistance: 4: Min guard Locomotion: Ambulation: 4: Travels 150 ft or more with minimal assistance (Pt.>75%)  Comprehension Comprehension Mode: Auditory Comprehension: 5-Understands basic 90% of the time/requires cueing < 10% of the time  Expression Expression Mode: Verbal Expression: 5-Expresses basic 90% of the time/requires cueing < 10% of the time.  Social Interaction Social Interaction: 5-Interacts appropriately 90% of the time - Needs monitoring or encouragement for participation or interaction.  Problem Solving Problem Solving: 4-Solves basic 75 - 89% of the time/requires cueing 10 - 24% of the time  Memory Memory: 4-Recognizes or recalls 75 - 89% of the time/requires cueing 10 - 24% of the time  Medical Problem List and Plan: 1. Functional deficits secondary to Debility /HCAP/Gait disorder 2. DVT Prophylaxis/Anticoagulation: Chronic coumadin/A fib. 3. Pain Management: Hydrocodone as needed. 4. Mood: Lexapro 5 mg qhs  -CT with baseline atrophy 5. Neuropsych: This patient is capable of making decisions on her own behalf.  -unresponsive episode earlier this week--- ?bp related.  No indications of a seizure 6. Skin/Wound Care: Routine skin checks 7. Fluids/Electrolytes/Nutrition: replace K+ 8.ID/HCAP. Augmentin completed.  -wbc's  decreased)  -IS, FV, OOB  -UA equivocal, culture negative, he's afebrile 9.LBBB./non ischemic cardiomyopathy.Follow up cardiology on defibrillator in reference to being unable to place LV lead due to no appropriate veins and plan follow-up  -net I/O's negative, cxr with interstitial edema  -appreciate cards help  -torsemide increased to 10mg  bid  -weights daily, controlled  -  orthostatic VS--negative--dc 10.CRI.Baseline Creatinine 2.28-2.75 at baseline 11. Hypertension. Norvasc 10 mg daily, Coreg 12.5 mg twice a day, hydralazine 100 mg 3 times a day, Imdur 90 mg twice a day, Demadex 10 mg  daily.  -bp's have been better 12. GERD versus esophageal dysmotility Dysphagia 2 thin liquid diet. Advance diet as tolerated 13. Hypothyroidism. Synthroid 14. Hyperlipidemia. Lipitor 15. Acute on chronic anemia. Continue iron supplement. Follow-up hgb 8.8 16. COPD. Symbicort,  mucinex bid 17. Diabetes: resumed glipizide 2.5mg  bid, SSI  -controlled   LOS (Days) 15 A FACE TO FACE EVALUATION WAS PERFORMED  Sairah Knobloch T 04/01/2015 8:09 AM

## 2015-04-01 NOTE — Progress Notes (Signed)
Patient discharged home.  Left floor via wheelchair, escorted by nursing staff and family.  All patient belongings sent with patient.  Patient and family verbalized understanding of discharge instructions as given by Dan Angiulli, PA.  Patient appears to be in no immediate distress at this time.  Girtha Kilgore J, RN  

## 2015-04-01 NOTE — Discharge Instructions (Signed)
Inpatient Rehab Discharge Instructions  Frank Mclean Discharge date and time: No discharge date for patient encounter.   Activities/Precautions/ Functional Status: Activity: activity as tolerated Diet: Soft diet with diabetic restrictions Wound Care: none needed Functional status:  ___ No restrictions     ___ Walk up steps independently ___ 24/7 supervision/assistance   ___ Walk up steps with assistance ___ Intermittent supervision/assistance  ___ Bathe/dress independently ___ Walk with walker     ___ Bathe/dress with assistance ___ Walk Independently    ___ Shower independently _x__ Walk with assistance    ___ Shower with assistance ___ No alcohol     ___ Return to work/school ________  COMMUNITY REFERRALS UPON DISCHARGE:   Home Health:   PT     OT     ST     RN     Nurse Aide  Agency:  [facility] Phone:  [phone]  Medical Equipment/Items Ordered:  18x18 wheelchair with 18x18 cushion; bedside commode  Agency/Supplier:  [facility]       Phone:  [phone]  GENERAL COMMUNITY RESOURCES FOR PATIENT/FAMILY: Support Groups:  Dr. Jacquelyne Balint recommended a group for you Caregiver Support:  [facility]   [email or web address]  [phone]  Special Instructions:  Follow up with scheduled appointment Dr. Lavinia Sharps for planned repair of LV lead on pacemaker   Follow-up [place] [facility] [phone] fax [phone] for  prothrombin time 04/03/2014 while maintained on chronic Coumadin  My questions have been answered and I understand these instructions. I will adhere to these goals and the provided educational materials after my discharge from the hospital.  Patient/Caregiver Signature _______________________________ Date __________  Clinician Signature _______________________________________ Date __________  Please bring this form and your medication list with you to all your follow-up doctor's appointments.

## 2015-04-01 NOTE — Consult Note (Signed)
NEUROBEHAVIORAL STATUS EXAM - CONFIDENTIAL Frank Mclean   MEDICAL NECESSITY:  Frank Mclean was seen on the [facility] for a neurobehavioral status exam owing to the patient's diagnosis of debility, and to assist in treatment planning during admission.   According to medical records, Frank Mclean was admitted to the rehab unit owing to "Functional deficits secondary to debility related to multiple medical, recent respiratory failure and pneumonia." Records also indicate that he is a "74 y.o. right handed male with history of chronic renal insufficiency with baseline creatinine 2.28-2.75, long-standing nonischemic cardiomyopathy, systolic congestive heart failure, left bundle branch block, atrial fibrillation with chronic Coumadin." Head CT scan performed 03/14/2015 reportedly revealed no acute intracranial pathology, mild to moderate cortical volume loss and scattered small vessel ischemic microangiopathy, and chronic lacunar infarcts at the right thalamus and left basal ganglia.  Patient was previously seen by this provider on 03/24/2015. Pertinent information from that visit is as follows:   Overall, Frank Mclean appears to be suffering from a great deal of cognitive deficit as well as depression. However, it is noteworthy that the patient tends to deny mood symptoms to staff but will admit them to family and friends. At this time, it would be prudent for [place] to ascertain the etiology behind his cognitive disruption and treat as warranted. In the meantime, I plan to continue following the patient throughout this admission for coping. A repeat mental status exam will be considered at next visit if he remains on the unit.  During today's visit, Frank Mclean continues to deny suffering from any cognitive deficits. He reportedly suffered "mini strokes" in 2004. He again described his current mood as "pretty good" but was more open about admitted to being quite  "emotional" and he even became tearful throughout this session. Mostly this was related to his fear about his health. We discussed the importance of support for emotional wellbeing including participation in a support group for individuals with chronic illnesses. He seemed disinterested. Suicidal/homicidal ideation, plan or intent was denied. No manic or hypomanic episodes were reported. The patient denied ever experiencing any auditory/visual hallucinations.   Frank Mclean continues to feel that he is making progress in therapy. No barriers to therapy identified.  He again described the rehab staff as "great." He has his wife and family to support him throughout this endeavor. No barriers to therapy identified.   PROCEDURES ADMINISTERED: [2 units 96116] Diagnostic clinical interview  Review of available records Montreal Cognitive Assessment (MoCA)  MENTAL STATUS: Frank Mclean mental status exam score of 13/26 (modified MoCA score) is well below the cutoff used to indicate the presence of dementia (previous score was 14/30). He lost points for poor visuospatial/executive difficulties, memory, attention, fluency, and abstraction.    IMPRESSION & RECOMMENDATIONS: Frank Mclean continues to experiencing severe cognitive disruption; at the level of dementia. And I do not feel that his present medical state and/or medications are solely responsible for this level of deficit. I am concerned for a co-morbid neurodegenerative condition but a standard dementia work-up has yet to be performed. Regarding mood, patient was more open to admitting to depressive feelings. As such, continued follow-up regarding his antidepressant is warranted. As previously recommended, I think it prudent for [place] to better ascertain the etiology behind Frank Mclean's cognitive disruption and treat as warranted. This can be accomplished via a dementia specialist such as a neurologist. No further neuropsychological testing warranted at present.  However, it would be valuable in the future to  assess for interval change in cognitive status.   OF NOTE: immediately following this visit, I spoke with Frank Mclean brother-in-law who told me that they have noticed cognitive issues for some time now. They believe that his deficits may be medication-induced. I informed him that given the history that I was concerned for a co-morbid neurodegenerative process and that further evaluation is warranted to assist in differential diagnosis. He was agreeable to that recommendation.   DIAGNOSES:  Major Neurocognitive Disorder (i.e., dementia); etiology unclear (could be multifactorial); possible neurodegenerative condition Adjustment reaction with depressed mood   Debbe Mounts, Psy.D.  Clinical Neuropsychologist

## 2015-04-01 NOTE — Telephone Encounter (Signed)
Transition Care Management Follow-up Telephone Call D/c 04/01/15  How have you been since you were released from the hospital? Wife states he is ok just came home today   Do you understand why you were in the hospital? YES   Do you understand the discharge instrcutions? YES  Items Reviewed:  Medications reviewed: YES  Allergies reviewed: YES  Dietary changes reviewed: YES  Referrals reviewed: No referral needed   Functional Questionnaire:   Activities of Daily Living (ADLs):   She states he are independent in the following: bathing and hygiene, feeding, continence, grooming, toileting and dressing States he require assistance with the following: ambulation  Any transportation issues/concerns?: NO   Any patient concerns? NO   Confirmed importance and date/time of follow-up visits scheduled: YES, appt has been made for 04/10/15 w/Dr. plotnikov   Confirmed with patient if condition begins to worsen call PCP or go to the ER.

## 2015-04-02 ENCOUNTER — Encounter: Payer: Medicare Other | Admitting: Surgery

## 2015-04-02 DIAGNOSIS — Z48812 Encounter for surgical aftercare following surgery on the circulatory system: Secondary | ICD-10-CM | POA: Diagnosis not present

## 2015-04-02 DIAGNOSIS — I5020 Unspecified systolic (congestive) heart failure: Secondary | ICD-10-CM | POA: Diagnosis not present

## 2015-04-02 DIAGNOSIS — I129 Hypertensive chronic kidney disease with stage 1 through stage 4 chronic kidney disease, or unspecified chronic kidney disease: Secondary | ICD-10-CM | POA: Diagnosis not present

## 2015-04-02 DIAGNOSIS — N183 Chronic kidney disease, stage 3 (moderate): Secondary | ICD-10-CM | POA: Diagnosis not present

## 2015-04-02 DIAGNOSIS — E119 Type 2 diabetes mellitus without complications: Secondary | ICD-10-CM | POA: Diagnosis not present

## 2015-04-02 DIAGNOSIS — I480 Paroxysmal atrial fibrillation: Secondary | ICD-10-CM | POA: Diagnosis not present

## 2015-04-02 NOTE — Progress Notes (Signed)
Social Work Discharge Note  The overall goal for the admission was met for:   Discharge location: Yes - home  Length of Stay: Yes - 16 days  Discharge activity level: Yes - minimal assistance  Home/community participation: Yes  Services provided included: MD, RD, PT, OT, SLP, RN, Pharmacy, Neuropsych and SW  Financial Services: Medicare and Private Insurance: Warden/ranger  Follow-up services arranged: Home Health: RN, PT, OT, SLP, Nurse Tech, DME: [phone] lightweight w/c with basic cushion and Patient/Family request agency HH: [facility], DME: [facility]  Comments (or additional information):  Pt discharged home with his wife and brother-in-law to provide minimal assistance level of care.  Pt will also have home health for support.  Patient/Family verbalized understanding of follow-up arrangements: Yes  Individual responsible for coordination of the follow-up plan: pt's wife and brother-in-law  Confirmed correct DME delivered: Trey Sailors 04/02/2015    Lonnetta Kniskern, Silvestre Mesi

## 2015-04-03 ENCOUNTER — Encounter (HOSPITAL_COMMUNITY): Admission: RE | Payer: Self-pay | Source: Ambulatory Visit

## 2015-04-03 ENCOUNTER — Ambulatory Visit (HOSPITAL_COMMUNITY): Admission: RE | Admit: 2015-04-03 | Payer: Medicare Other | Source: Ambulatory Visit | Admitting: Internal Medicine

## 2015-04-03 ENCOUNTER — Telehealth: Payer: Self-pay | Admitting: *Deleted

## 2015-04-03 ENCOUNTER — Encounter: Payer: Self-pay | Admitting: Internal Medicine

## 2015-04-03 DIAGNOSIS — I5020 Unspecified systolic (congestive) heart failure: Secondary | ICD-10-CM | POA: Diagnosis not present

## 2015-04-03 DIAGNOSIS — I480 Paroxysmal atrial fibrillation: Secondary | ICD-10-CM | POA: Diagnosis not present

## 2015-04-03 DIAGNOSIS — Z48812 Encounter for surgical aftercare following surgery on the circulatory system: Secondary | ICD-10-CM | POA: Diagnosis not present

## 2015-04-03 DIAGNOSIS — I129 Hypertensive chronic kidney disease with stage 1 through stage 4 chronic kidney disease, or unspecified chronic kidney disease: Secondary | ICD-10-CM | POA: Diagnosis not present

## 2015-04-03 DIAGNOSIS — E119 Type 2 diabetes mellitus without complications: Secondary | ICD-10-CM | POA: Diagnosis not present

## 2015-04-03 DIAGNOSIS — N183 Chronic kidney disease, stage 3 (moderate): Secondary | ICD-10-CM | POA: Diagnosis not present

## 2015-04-03 SURGERY — BI-VENTRICULAR IMPLANTABLE CARDIOVERTER DEFIBRILLATOR  (CRT-D)

## 2015-04-03 NOTE — Telephone Encounter (Signed)
Caller requesting verbal orders for 2 f/u visits for community services and emotional support for pt's wife. Verbal orders given.

## 2015-04-04 ENCOUNTER — Telehealth: Payer: Self-pay | Admitting: Internal Medicine

## 2015-04-04 DIAGNOSIS — I129 Hypertensive chronic kidney disease with stage 1 through stage 4 chronic kidney disease, or unspecified chronic kidney disease: Secondary | ICD-10-CM | POA: Diagnosis not present

## 2015-04-04 DIAGNOSIS — I5020 Unspecified systolic (congestive) heart failure: Secondary | ICD-10-CM | POA: Diagnosis not present

## 2015-04-04 DIAGNOSIS — E119 Type 2 diabetes mellitus without complications: Secondary | ICD-10-CM | POA: Diagnosis not present

## 2015-04-04 DIAGNOSIS — I480 Paroxysmal atrial fibrillation: Secondary | ICD-10-CM | POA: Diagnosis not present

## 2015-04-04 DIAGNOSIS — N183 Chronic kidney disease, stage 3 (moderate): Secondary | ICD-10-CM | POA: Diagnosis not present

## 2015-04-04 DIAGNOSIS — Z48812 Encounter for surgical aftercare following surgery on the circulatory system: Secondary | ICD-10-CM | POA: Diagnosis not present

## 2015-04-04 NOTE — Telephone Encounter (Signed)
Ok Thx 

## 2015-04-04 NOTE — Telephone Encounter (Signed)
Needs verbal authorization for physical therapy for 1x 1 week and 2x for 6 weeks.

## 2015-04-04 NOTE — Telephone Encounter (Signed)
Called Vernona Rieger back no answer LMOM with md response...Raechel Chute

## 2015-04-06 NOTE — Progress Notes (Signed)
Cardiology Office Note   Date:  04/06/2015   ID:  Frank Mclean, Frank Mclean March 23, 1941, MRN [id number]  PCP:  [name], MD  Cardiologist:  Dr. Rollene Rotunda   Electrophysiologist:  Dr. Lewayne Bunting CHF:  Dr. Marca Ancona   Chief Complaint  Patient presents with  . Congestive Heart Failure  . Atrial Fibrillation     History of Present Illness: Frank Mclean is a 74 y.o. male with a hx of chronic combined systolic and diastolic CHF, NICM (EF 25-30%), nonobstructive CAD (cath 2012), AFib, CAD, HTN, HL, PAD, LE venous ulcers (followed by wound clinic), hypothyroidism in the setting of amiodarone use, CKD.  Admitted 05/2013 for AFib with RVR complicated by elevated troponin/demand ischemia and a/c combined systolic and diastolic CHF. He was placed on amiodarone at that time with restoration of NSR. He has recently had significant problems with profound hypothyroidism.  Amiodarone was DC'd and his TSH has markedly improved.   I saw him in 1/8.  He complained of increased DOE.  He did not look volume overloaded.  Nuclear stress test was ordered and this demonstrated inf and apical defect c/w prior inf and apical infarct with mild apical ischemia, EF 25%.  Nuclear study was called high risk due to reduced EF.  He FU with Dr. Antoine Poche in 01/2015 and was felt to need high risk cardiac cath. However, he was evaluated by the CHF Clinic first.  He saw Dr. Marca Ancona and R heart cath was recommended.  This was done 2/25 and demonstrated low CI (2.1 by Fick and 1.8 by thermodilution), elevated PCWP and mod pulmonary HTN (probably mixed venous and perhaps a component of pulmonary arterial HTN from reactive pulmonary vascular changes), R heart filling pressures not significantly elevated.  Patient had high systemic BP during case which likely effected results.  Of note, given lack of angina, LHC was not recommended.  Diuretics were adjusted for low output.  He was referred to EP (Dr. Ladona Ridgel) for CRT +/-  ICD.    Admitted 3/21-3/24 with a/c systolic HF.  During admission, attempt was made at CRT-D implantation.  LV lead could not be placed and patient would need epicardial LV lead.  Referral was made to Dr. Laneta Simmers.  He was DC to SNF.   Readmitted 3/24-3/28 with HCAP.  He was treated with antibiotics and DC to inpatient rehab.    Studies/Reports Reviewed Today:   - Nuclear Stress Test (1/16): High risk stress nuclear study with a large, severe, partially reversible inferior and apical defect consistent with prior inferior and apical infarct; mild apical ischemia; severe LVE; study high risk due to reduced LV function.  EF 25%    Past Medical History  Diagnosis Date  . Gout   . HTN (hypertension)   . Hyperlipidemia   . Osteoarthritis   . History of CVA (cerebrovascular accident)     Left pontine infarct July 2004; changed from Plavix to Coumadin in 2004 per [facility] at [facility]  . GERD (gastroesophageal reflux disease)   . DJD (degenerative joint disease)   . BPH (benign prostatic hypertrophy)   . Peripheral vascular disease   . Bilateral leg ulcer     ACHILLES AREA--  NONHEALING  . CKD (chronic kidney disease) stage 3, GFR 30-59 ml/min   . CAD (coronary artery disease)     a. LHC 4/12: Mid LAD 25%, mid diagonal 30%, AV circumflex 40%, proximal OM 25%, distal RCA 40%  . Chronic combined systolic and diastolic heart  failure     a. Echo 05/2013: EF 25-30%, diffuse HK, restrictive physiology, trivial AI, mild MR, moderate LAE, reduced RV systolic function, PASP 42  . LBBB (left bundle branch block)   . PAF (paroxysmal atrial fibrillation)     a. amiodarone Rx started 05/2013;  b. chronic coumadin  . NICM (nonischemic cardiomyopathy)     a. EF 25-30%.  [name] Hx of cardiovascular stress test     Nuclear Stress Test (1/16): High risk stress nuclear study with a large, severe, partially reversible inferior and apical defect consistent with prior inferior and apical infarct; mild apical ischemia;  severe LVE; study high risk due to reduced LV function.  EF 25% >> reviewed with Dr. Antoine Poche >> medical mgmt  . Hypothyroidism   . DM (diabetes mellitus), type 2     Past Surgical History  Procedure Laterality Date  . Cardiac catheterization  04-16-2011   [facility]    NON-OBSTRUCTIVE CAD. MILDLY ELEVATED PULMONARY PRESSURES/ ELEVATED  END-DIASTOLIC PRESSURE  . Transthoracic echocardiogram  12-31-2010    MODERATE CONCENTRIC LVH/ SYSTOLIC FUNCTION SEVERELY REDUCED/ EF 25-30%/  SEVERE HYPOKINESIS OF ANTEROSEPTAL MYOCARDIUM  AND ENTIREAPICAL MYOCARDIUM /  MODERATE HYPOKINESIS OF LATERAL, INFEROLATERAL, INFERIOR,AND INFEROSEPTAL MYOCARIUM/  GRADE 3 DIASTOLIC DYSFUNCTION/ MILD MR  . Aortogram w/ bilateral lower extremitiy runoff  05-18-2013  DR FIELDS    LEFT LEG OCCLUDED PERONEAL AND ANTERIOR TIBIAL ARTERIES/ HIGH GRADE STENOIS 80% MIDDLE AND DISTAL THIRD OF POSTERIOR TIBIAL ARTERY/ RIGHT PERONEAL AND ANTERIOR TIBIAL ARTERY OCCLUDED/ 40% STENOSIS DISTALLY   . Abdominal aortagram N/A 05/18/2013    Procedure: ABDOMINAL Ronny Flurry;  Surgeon: Sherren Kerns, MD;  Location: [facility] CATH LAB;  Service: Cardiovascular;  Laterality: N/A;  . Lower extremity angiogram Bilateral 05/18/2013    Procedure: LOWER EXTREMITY ANGIOGRAM;  Surgeon: Sherren Kerns, MD;  Location: [facility] CATH LAB;  Service: Cardiovascular;  Laterality: Bilateral;  . Right heart catheterization N/A 02/13/2015    Procedure: RIGHT HEART CATH;  Surgeon: Laurey Morale, MD;  Location: [facility] CATH LAB;  Service: Cardiovascular;  Laterality: N/A;  . Bi-ventricular implantable cardioverter defibrillator N/A 03/12/2015    Procedure: BI-VENTRICULAR IMPLANTABLE CARDIOVERTER DEFIBRILLATOR  (CRT-D);  Surgeon: Marinus Maw, MD;  Location: [facility] CATH LAB;  Service: Cardiovascular;  Laterality: N/A;     Current Outpatient Prescriptions  Medication Sig Dispense Refill  . amLODipine (NORVASC) 10 MG tablet Take 1 tablet (10 mg total) by mouth daily. 30 tablet 1    . atorvastatin (LIPITOR) 20 MG tablet Take 1 tablet (20 mg total) by mouth daily. 90 tablet 3  . carvedilol (COREG) 12.5 MG tablet Take 1 tablet (12.5 mg total) by mouth 2 (two) times daily with a meal. 60 tablet 11  . cholecalciferol (VITAMIN D) 1000 UNITS tablet Take 1 tablet (1,000 Units total) by mouth daily. 30 tablet 1  . CVS VITAMIN B12 1000 MCG tablet TAKE 1 TABLET BY MOUTH DAILY 100 tablet 1  . escitalopram (LEXAPRO) 5 MG tablet Take 1 tablet (5 mg total) by mouth daily. 30 tablet 5  . ferrous sulfate 325 (65 FE) MG tablet Take 1 tablet (325 mg total) by mouth daily. 30 tablet 6  . glipiZIDE (GLUCOTROL) 5 MG tablet TAKE 1/2 TABLET BY MOUTH TWICE A DAY BEFORE A MEAL 30 tablet 11  . guaiFENesin (MUCINEX) 600 MG 12 hr tablet Take 1 tablet (600 mg total) by mouth 2 (two) times daily. 60 tablet 1  . hydrALAZINE (APRESOLINE) 100 MG tablet Take 1 tablet (100  mg total) by mouth 3 (three) times daily. 90 tablet 11  . HYDROcodone-acetaminophen (NORCO/VICODIN) 5-325 MG per tablet Take 1 tablet by mouth every 6 (six) hours as needed for moderate pain. 60 tablet 0  . isosorbide mononitrate (IMDUR) 60 MG 24 hr tablet Take 1.5 tablets for 90 mg by mouth twice a day. 45 tablet 11  . Multiple Vitamin (MULTIVITAMIN WITH MINERALS) TABS tablet Take 1 tablet by mouth daily.    . OXYGEN Inhale 2.5 L into the lungs See admin instructions. USES OXYGEN EVERY BEDTIME USES DURING DAY ONLY AS NEEDED    . pantoprazole (PROTONIX) 40 MG tablet Take 1 tablet (40 mg total) by mouth 2 (two) times daily. 60 tablet 1  . polyethylene glycol (MIRALAX / GLYCOLAX) packet Take 17 g by mouth daily. 14 each 0  . potassium chloride SA (K-DUR,KLOR-CON) 20 MEQ tablet Take 1 tablet (20 mEq total) by mouth daily. 30 tablet 1  . SYMBICORT 160-4.5 MCG/ACT inhaler INHALE 2 PUFFS INTO THE LUNGS 2 TIMES DAILY 10.2 g 5  . SYNTHROID 100 MCG tablet TAKE 2 TABLETS BY MOUTH EVERY MORNING BEFORE BREAKFAST 60 tablet 11  . torsemide (DEMADEX) 10  MG tablet Take 1 tablet (10 mg total) by mouth daily. 30 tablet 1  . warfarin (COUMADIN) 5 MG tablet 5 mg tablet daily except one half tablet on Mondays and Fridays 30 tablet 1   No current facility-administered medications for this visit.    Allergies:   Levothyroxine sodium; Chicken protein; and Eggs or egg-derived products    Social History:  The patient  reports that he quit smoking about 41 years ago. He has never used smokeless tobacco. He reports that he does not drink alcohol or use illicit drugs.   Family History:  [name] family history includes Diabetes in his mother; Gout in his other; Heart attack in his maternal grandmother; Heart disease in his father and mother; Hypertension in his father and mother; Stroke in his other. There is no history of Thyroid disease.    ROS:  Please see the history of present illness.   Otherwise, review of systems are positive for none.   All other systems are reviewed and negative.    PHYSICAL EXAM: VS:  There were no vitals taken for this visit.    Wt Readings from Last 3 Encounters:  03/14/15 210 lb 8.6 oz (95.5 kg)  03/13/15 203 lb (92.08 kg)  03/05/15 224 lb 12.8 oz (101.969 kg)     GEN: Well nourished, well developed, in no acute distress HEENT: normal Neck: no JVD, no masses Cardiac:  Normal S1/S2, RRR; no murmur, no rubs or gallops, trace ankle edema  Respiratory:  Decreased breath sounds bilaterally, no wheezing, rhonchi or rales. GI: soft, nontender, nondistended, + BS MS: no deformity or atrophy Skin: warm and dry  Neuro:  CNs II-XII intact, Strength and sensation are intact Psych: Normal affect   EKG:  EKG is ordered today.  It demonstrates:   Sinus brady, HR 54, LBBB   Recent Labs: 06/24/2014: Magnesium 2.0 01/23/2015: Pro B Natriuretic peptide (BNP) 432.0* 03/14/2015: B Natriuretic Peptide 315.7* 03/18/2015: ALT 113* 03/26/2015: TSH 1.458 03/27/2015: BUN 18; Creatinine 2.05*; Hemoglobin 8.9*; Platelets 289; Potassium  3.3*; Sodium 145    Lipid Panel    Component Value Date/Time   CHOL 269* 06/24/2014 1139   TRIG 133.0 06/24/2014 1139   TRIG 89 12/21/2006 1117   HDL 56.50 06/24/2014 1139   CHOLHDL 5 06/24/2014 1139   CHOLHDL  3.8 CALC 12/21/2006 1117   VLDL 26.6 06/24/2014 1139   LDLCALC 186* 06/24/2014 1139   LDLDIRECT 177.9 06/17/2010 0909      ASSESSMENT AND PLAN:  1.  Chronic Combined Systolic and Diastolic CHF:  2.  Nonischemic Cardiomyopathy: Continue beta blocker, nitrates, hydralazine.  3. CAD: He has a history of nonobstructive disease by prior cardiac catheterization. Recent Myoview was called high risk but this was mainly for his reduced ejection fraction. He denies chest discomfort. He is high risk for contrast-induced nephropathy in the setting of chronic kidney disease. Continue beta blocker, nitrates, amlodipine. He is not on aspirin as he is on Coumadin.  4. Atrial Fibrillation: Maintaining NSR. Continue Coumadin. He is now off Amio.  5. CKD: Follow-up with nephrology as planned. He has declined AVF placement for now.  6. Hypertension: Controlled.  7. Hyperlipidemia:    Current medicines are reviewed at length with the patient today.  The patient does not have concerns regarding medicines.  The following changes have been made:      Labs/ tests ordered today include:  No orders of the defined types were placed in this encounter.    Disposition:   FU with    Signed, Brynda Rim, MHS 04/06/2015 10:33 PM    [facility] [address] [place], [place], [place]  [address] Phone: [phone]; Fax: [phone]    This encounter was created in error - please disregard.

## 2015-04-07 ENCOUNTER — Encounter: Payer: Medicare Other | Admitting: Physician Assistant

## 2015-04-07 DIAGNOSIS — I480 Paroxysmal atrial fibrillation: Secondary | ICD-10-CM | POA: Diagnosis not present

## 2015-04-07 DIAGNOSIS — I5020 Unspecified systolic (congestive) heart failure: Secondary | ICD-10-CM | POA: Diagnosis not present

## 2015-04-07 DIAGNOSIS — N183 Chronic kidney disease, stage 3 (moderate): Secondary | ICD-10-CM | POA: Diagnosis not present

## 2015-04-07 DIAGNOSIS — I129 Hypertensive chronic kidney disease with stage 1 through stage 4 chronic kidney disease, or unspecified chronic kidney disease: Secondary | ICD-10-CM | POA: Diagnosis not present

## 2015-04-07 DIAGNOSIS — E119 Type 2 diabetes mellitus without complications: Secondary | ICD-10-CM | POA: Diagnosis not present

## 2015-04-07 DIAGNOSIS — Z48812 Encounter for surgical aftercare following surgery on the circulatory system: Secondary | ICD-10-CM | POA: Diagnosis not present

## 2015-04-08 DIAGNOSIS — I480 Paroxysmal atrial fibrillation: Secondary | ICD-10-CM | POA: Diagnosis not present

## 2015-04-08 DIAGNOSIS — E119 Type 2 diabetes mellitus without complications: Secondary | ICD-10-CM | POA: Diagnosis not present

## 2015-04-08 DIAGNOSIS — I5020 Unspecified systolic (congestive) heart failure: Secondary | ICD-10-CM | POA: Diagnosis not present

## 2015-04-08 DIAGNOSIS — I129 Hypertensive chronic kidney disease with stage 1 through stage 4 chronic kidney disease, or unspecified chronic kidney disease: Secondary | ICD-10-CM | POA: Diagnosis not present

## 2015-04-08 DIAGNOSIS — N183 Chronic kidney disease, stage 3 (moderate): Secondary | ICD-10-CM | POA: Diagnosis not present

## 2015-04-08 DIAGNOSIS — Z48812 Encounter for surgical aftercare following surgery on the circulatory system: Secondary | ICD-10-CM | POA: Diagnosis not present

## 2015-04-09 ENCOUNTER — Encounter: Payer: Medicare Other | Admitting: Surgery

## 2015-04-09 ENCOUNTER — Encounter (HOSPITAL_COMMUNITY): Payer: Medicare Other

## 2015-04-10 ENCOUNTER — Ambulatory Visit: Payer: Medicare Other | Admitting: Internal Medicine

## 2015-04-11 DIAGNOSIS — Z48812 Encounter for surgical aftercare following surgery on the circulatory system: Secondary | ICD-10-CM | POA: Diagnosis not present

## 2015-04-11 DIAGNOSIS — N183 Chronic kidney disease, stage 3 (moderate): Secondary | ICD-10-CM | POA: Diagnosis not present

## 2015-04-11 DIAGNOSIS — E119 Type 2 diabetes mellitus without complications: Secondary | ICD-10-CM | POA: Diagnosis not present

## 2015-04-11 DIAGNOSIS — I129 Hypertensive chronic kidney disease with stage 1 through stage 4 chronic kidney disease, or unspecified chronic kidney disease: Secondary | ICD-10-CM | POA: Diagnosis not present

## 2015-04-11 DIAGNOSIS — I5020 Unspecified systolic (congestive) heart failure: Secondary | ICD-10-CM | POA: Diagnosis not present

## 2015-04-11 DIAGNOSIS — I480 Paroxysmal atrial fibrillation: Secondary | ICD-10-CM | POA: Diagnosis not present

## 2015-04-14 ENCOUNTER — Ambulatory Visit (INDEPENDENT_AMBULATORY_CARE_PROVIDER_SITE_OTHER): Payer: Medicare Other | Admitting: *Deleted

## 2015-04-14 ENCOUNTER — Encounter: Payer: Self-pay | Admitting: Internal Medicine

## 2015-04-14 DIAGNOSIS — I428 Other cardiomyopathies: Secondary | ICD-10-CM

## 2015-04-14 DIAGNOSIS — I639 Cerebral infarction, unspecified: Secondary | ICD-10-CM

## 2015-04-14 DIAGNOSIS — I6350 Cerebral infarction due to unspecified occlusion or stenosis of unspecified cerebral artery: Secondary | ICD-10-CM

## 2015-04-14 DIAGNOSIS — Z7901 Long term (current) use of anticoagulants: Secondary | ICD-10-CM | POA: Diagnosis not present

## 2015-04-14 DIAGNOSIS — Z5181 Encounter for therapeutic drug level monitoring: Secondary | ICD-10-CM | POA: Diagnosis not present

## 2015-04-14 DIAGNOSIS — I429 Cardiomyopathy, unspecified: Secondary | ICD-10-CM | POA: Diagnosis not present

## 2015-04-14 DIAGNOSIS — Z95810 Presence of automatic (implantable) cardiac defibrillator: Secondary | ICD-10-CM | POA: Diagnosis not present

## 2015-04-14 DIAGNOSIS — I5022 Chronic systolic (congestive) heart failure: Secondary | ICD-10-CM | POA: Diagnosis not present

## 2015-04-14 LAB — MDC_IDC_ENUM_SESS_TYPE_INCLINIC
Battery Remaining Longevity: 129 mo
Battery Voltage: 3.14 V
Brady Statistic AP VP Percent: 3.04 %
Brady Statistic AP VS Percent: 1.47 %
Brady Statistic AS VP Percent: 10.01 %
Brady Statistic AS VS Percent: 85.48 %
Brady Statistic RV Percent Paced: 12.57 %
Date Time Interrogation Session: 20160425114651
HIGH POWER IMPEDANCE MEASURED VALUE: 171 Ohm
HIGH POWER IMPEDANCE MEASURED VALUE: 63 Ohm
Lead Channel Impedance Value: 4047 Ohm
Lead Channel Impedance Value: 4047 Ohm
Lead Channel Impedance Value: 4047 Ohm
Lead Channel Impedance Value: 456 Ohm
Lead Channel Pacing Threshold Pulse Width: 0.4 ms
Lead Channel Sensing Intrinsic Amplitude: 13 mV
Lead Channel Sensing Intrinsic Amplitude: 13.375 mV
Lead Channel Sensing Intrinsic Amplitude: 5.125 mV
Lead Channel Setting Pacing Amplitude: 3.5 V
Lead Channel Setting Pacing Amplitude: 3.5 V
Lead Channel Setting Pacing Pulse Width: 0.4 ms
MDC IDC MSMT LEADCHNL RA PACING THRESHOLD AMPLITUDE: 0.75 V
MDC IDC MSMT LEADCHNL RA PACING THRESHOLD PULSEWIDTH: 0.4 ms
MDC IDC MSMT LEADCHNL RV IMPEDANCE VALUE: 361 Ohm
MDC IDC MSMT LEADCHNL RV PACING THRESHOLD AMPLITUDE: 0.75 V
MDC IDC SET LEADCHNL RV SENSING SENSITIVITY: 0.3 mV
MDC IDC SET ZONE DETECTION INTERVAL: 350 ms
MDC IDC SET ZONE DETECTION INTERVAL: 450 ms
MDC IDC STAT BRADY RA PERCENT PACED: 4.51 %
Zone Setting Detection Interval: 300 ms
Zone Setting Detection Interval: 360 ms

## 2015-04-14 LAB — POCT INR: INR: 8

## 2015-04-14 LAB — PROTIME-INR
INR: 4.94 — ABNORMAL HIGH (ref <1.50)
PROTHROMBIN TIME: 46.3 s — AB (ref 11.6–15.2)

## 2015-04-14 NOTE — Progress Notes (Signed)
Wound check appointment. Steri-strips removed. Wound without redness or edema. Incision edges approximated, wound well healed. Normal device function. Thresholds, sensing, and impedances consistent with implant measurements. Device programmed at 3.5V for extra safety margin until 3 month visit. Histogram distribution appropriate for patient and level of activity. No mode switches or ventricular arrhythmias noted. Patient educated about wound care, arm mobility, lifting restrictions, shock plan--instruction card/magnet given to pt. Alert tones demonstrated and pt aware to contact office if heard. Changed to RV pacing only. At implant, unable to place LV lead. ROV in 3 months with GT.

## 2015-04-15 DIAGNOSIS — I129 Hypertensive chronic kidney disease with stage 1 through stage 4 chronic kidney disease, or unspecified chronic kidney disease: Secondary | ICD-10-CM | POA: Diagnosis not present

## 2015-04-15 DIAGNOSIS — I5020 Unspecified systolic (congestive) heart failure: Secondary | ICD-10-CM | POA: Diagnosis not present

## 2015-04-15 DIAGNOSIS — Z48812 Encounter for surgical aftercare following surgery on the circulatory system: Secondary | ICD-10-CM | POA: Diagnosis not present

## 2015-04-15 DIAGNOSIS — E119 Type 2 diabetes mellitus without complications: Secondary | ICD-10-CM | POA: Diagnosis not present

## 2015-04-15 DIAGNOSIS — N183 Chronic kidney disease, stage 3 (moderate): Secondary | ICD-10-CM | POA: Diagnosis not present

## 2015-04-15 DIAGNOSIS — I480 Paroxysmal atrial fibrillation: Secondary | ICD-10-CM | POA: Diagnosis not present

## 2015-04-16 DIAGNOSIS — E119 Type 2 diabetes mellitus without complications: Secondary | ICD-10-CM | POA: Diagnosis not present

## 2015-04-16 DIAGNOSIS — I5020 Unspecified systolic (congestive) heart failure: Secondary | ICD-10-CM | POA: Diagnosis not present

## 2015-04-16 DIAGNOSIS — N183 Chronic kidney disease, stage 3 (moderate): Secondary | ICD-10-CM | POA: Diagnosis not present

## 2015-04-16 DIAGNOSIS — I129 Hypertensive chronic kidney disease with stage 1 through stage 4 chronic kidney disease, or unspecified chronic kidney disease: Secondary | ICD-10-CM | POA: Diagnosis not present

## 2015-04-16 DIAGNOSIS — Z48812 Encounter for surgical aftercare following surgery on the circulatory system: Secondary | ICD-10-CM | POA: Diagnosis not present

## 2015-04-16 DIAGNOSIS — I480 Paroxysmal atrial fibrillation: Secondary | ICD-10-CM | POA: Diagnosis not present

## 2015-04-18 ENCOUNTER — Telehealth: Payer: Self-pay | Admitting: Internal Medicine

## 2015-04-18 DIAGNOSIS — I480 Paroxysmal atrial fibrillation: Secondary | ICD-10-CM | POA: Diagnosis not present

## 2015-04-18 DIAGNOSIS — I5020 Unspecified systolic (congestive) heart failure: Secondary | ICD-10-CM | POA: Diagnosis not present

## 2015-04-18 DIAGNOSIS — E119 Type 2 diabetes mellitus without complications: Secondary | ICD-10-CM | POA: Diagnosis not present

## 2015-04-18 DIAGNOSIS — N183 Chronic kidney disease, stage 3 (moderate): Secondary | ICD-10-CM | POA: Diagnosis not present

## 2015-04-18 DIAGNOSIS — I129 Hypertensive chronic kidney disease with stage 1 through stage 4 chronic kidney disease, or unspecified chronic kidney disease: Secondary | ICD-10-CM | POA: Diagnosis not present

## 2015-04-18 DIAGNOSIS — Z48812 Encounter for surgical aftercare following surgery on the circulatory system: Secondary | ICD-10-CM | POA: Diagnosis not present

## 2015-04-18 NOTE — Telephone Encounter (Signed)
Discharging home health aid b/c wife is doing bathing.

## 2015-04-18 NOTE — Telephone Encounter (Signed)
Just to let you know, fyi, thanks

## 2015-04-18 NOTE — Telephone Encounter (Signed)
Noted. Thx.

## 2015-04-21 ENCOUNTER — Other Ambulatory Visit (INDEPENDENT_AMBULATORY_CARE_PROVIDER_SITE_OTHER): Payer: Medicare Other

## 2015-04-21 ENCOUNTER — Ambulatory Visit (INDEPENDENT_AMBULATORY_CARE_PROVIDER_SITE_OTHER): Payer: Medicare Other | Admitting: Internal Medicine

## 2015-04-21 ENCOUNTER — Encounter: Payer: Self-pay | Admitting: Internal Medicine

## 2015-04-21 VITALS — BP 130/90 | HR 68 | Wt 210.0 lb

## 2015-04-21 DIAGNOSIS — I5022 Chronic systolic (congestive) heart failure: Secondary | ICD-10-CM | POA: Diagnosis not present

## 2015-04-21 DIAGNOSIS — E538 Deficiency of other specified B group vitamins: Secondary | ICD-10-CM

## 2015-04-21 DIAGNOSIS — M25561 Pain in right knee: Secondary | ICD-10-CM

## 2015-04-21 DIAGNOSIS — I639 Cerebral infarction, unspecified: Secondary | ICD-10-CM | POA: Diagnosis not present

## 2015-04-21 DIAGNOSIS — I5042 Chronic combined systolic (congestive) and diastolic (congestive) heart failure: Secondary | ICD-10-CM

## 2015-04-21 DIAGNOSIS — I2510 Atherosclerotic heart disease of native coronary artery without angina pectoris: Secondary | ICD-10-CM | POA: Diagnosis not present

## 2015-04-21 DIAGNOSIS — I2583 Coronary atherosclerosis due to lipid rich plaque: Secondary | ICD-10-CM

## 2015-04-21 DIAGNOSIS — N189 Chronic kidney disease, unspecified: Secondary | ICD-10-CM

## 2015-04-21 DIAGNOSIS — E1122 Type 2 diabetes mellitus with diabetic chronic kidney disease: Secondary | ICD-10-CM

## 2015-04-21 DIAGNOSIS — I5020 Unspecified systolic (congestive) heart failure: Secondary | ICD-10-CM | POA: Diagnosis not present

## 2015-04-21 LAB — HEPATIC FUNCTION PANEL
ALT: 19 U/L (ref 0–53)
AST: 16 U/L (ref 0–37)
Albumin: 3.8 g/dL (ref 3.5–5.2)
Alkaline Phosphatase: 96 U/L (ref 39–117)
BILIRUBIN TOTAL: 0.7 mg/dL (ref 0.2–1.2)
Bilirubin, Direct: 0.2 mg/dL (ref 0.0–0.3)
Total Protein: 7.4 g/dL (ref 6.0–8.3)

## 2015-04-21 LAB — CBC WITH DIFFERENTIAL/PLATELET
Basophils Absolute: 0.1 10*3/uL (ref 0.0–0.1)
Basophils Relative: 1.5 % (ref 0.0–3.0)
EOS PCT: 0.5 % (ref 0.0–5.0)
Eosinophils Absolute: 0.1 10*3/uL (ref 0.0–0.7)
HEMATOCRIT: 33.5 % — AB (ref 39.0–52.0)
Hemoglobin: 10.7 g/dL — ABNORMAL LOW (ref 13.0–17.0)
Lymphocytes Relative: 10.1 % — ABNORMAL LOW (ref 12.0–46.0)
Lymphs Abs: 1 10*3/uL (ref 0.7–4.0)
MCHC: 32.1 g/dL (ref 30.0–36.0)
MCV: 76.5 fl — ABNORMAL LOW (ref 78.0–100.0)
MONOS PCT: 7 % (ref 3.0–12.0)
Monocytes Absolute: 0.7 10*3/uL (ref 0.1–1.0)
NEUTROS ABS: 7.7 10*3/uL (ref 1.4–7.7)
Neutrophils Relative %: 80.9 % — ABNORMAL HIGH (ref 43.0–77.0)
Platelets: 343 10*3/uL (ref 150.0–400.0)
RBC: 4.38 Mil/uL (ref 4.22–5.81)
RDW: 19.2 % — ABNORMAL HIGH (ref 11.5–15.5)
WBC: 9.5 10*3/uL (ref 4.0–10.5)

## 2015-04-21 LAB — BASIC METABOLIC PANEL
BUN: 15 mg/dL (ref 6–23)
CO2: 31 mEq/L (ref 19–32)
Calcium: 9.5 mg/dL (ref 8.4–10.5)
Chloride: 105 mEq/L (ref 96–112)
Creatinine, Ser: 1.93 mg/dL — ABNORMAL HIGH (ref 0.40–1.50)
GFR: 43.76 mL/min — ABNORMAL LOW (ref >60.00)
GLUCOSE: 63 mg/dL — AB (ref 70–99)
Potassium: 3.1 mEq/L — ABNORMAL LOW (ref 3.5–5.1)
Sodium: 146 mEq/L — ABNORMAL HIGH (ref 135–145)

## 2015-04-21 LAB — T4, FREE: Free T4: 1.88 ng/dL — ABNORMAL HIGH (ref 0.60–1.60)

## 2015-04-21 LAB — TSH: TSH: 0.38 u[IU]/mL (ref 0.35–4.50)

## 2015-04-21 LAB — HEMOGLOBIN A1C: Hgb A1c MFr Bld: 6 % (ref 4.6–6.5)

## 2015-04-21 MED ORDER — ISOSORBIDE MONONITRATE ER 60 MG PO TB24: ORAL_TABLET | ORAL | Status: DC

## 2015-04-21 NOTE — Assessment & Plan Note (Signed)
Amlodipine, Lipitor, Coreg

## 2015-04-21 NOTE — Progress Notes (Signed)
Pre visit review using our clinic review tool, if applicable. No additional management support is needed unless otherwise documented below in the visit note. 

## 2015-04-21 NOTE — Assessment & Plan Note (Signed)
Glipizide?

## 2015-04-21 NOTE — Progress Notes (Signed)
Subjective:    HPI   F/u fatigue, SOB, weakness, apathy, sleepiness, and hypothyroidism. Frank Mclean is not weaker.  Using O2 at home  74 y.o. male w/ PMHx s/f chronic combined CHF, nonischemic cardiomyoapthy, nonobstructive CAD, PAF (amiodarone and Coumadin), chronic LBBB, DM2, HTN, HLD, PAD, CKD (stage III, baseline Cr 1.9-2.6), h/o CVA and chronic venous ulcers who was admitted to [facility] for CAP and decompensated heart failure.  1. Acute on chronic combined CHF/NICM, EF 25-30%  2. Nonobstructive CAD  3. PAF  4. Subtherapeutic INR now therapeutic  5. Type 2 DM  6. HTN  7. HLD  8. H/o CVA  9. CKD, stage III  10. Chronic venous ulcers  11. Hypokalemia  12. PAD  13. Chronic LBBB  14. CAP - L lung Patient improved and was discharged on 12/10.    F/u: Near syncope  Active Problems:  DM (diabetes mellitus), type 2 with renal complications  HYPERLIPIDEMIA  HYPERTENSION  Acute on chronic combined systolic and diastolic congestive heart failure  CVA  CKD (chronic kidney disease) stage 3, GFR 30-59 ml/min  Wound, open, leg  Atherosclerotic peripheral vascular disease with intermittent claudication  Orthostatic hypotension  Hypokalemia  Elevated troponin  Atrial fibrillation with RVR  Wt Readings from Last 3 Encounters:  04/21/15 210 lb (95.255 kg)  03/14/15 210 lb 8.6 oz (95.5 kg)  03/13/15 203 lb (92.08 kg)   BP Readings from Last 3 Encounters:  04/21/15 130/90  03/17/15 103/55  03/13/15 135/79      Review of Systems  Constitutional: Positive for fatigue. Negative for appetite change and unexpected weight change.  HENT: Negative for congestion, nosebleeds, sneezing and trouble swallowing.   Eyes: Negative for itching and visual disturbance.  Cardiovascular: Positive for leg swelling. Negative for palpitations.  Gastrointestinal: Negative for nausea, diarrhea, blood in stool and abdominal distention.  Genitourinary: Negative for frequency and hematuria.   Musculoskeletal: Positive for arthralgias. Negative for back pain, joint swelling, gait problem and neck pain.  Neurological: Negative for dizziness, tremors, speech difficulty and weakness.  Psychiatric/Behavioral: Positive for behavioral problems and decreased concentration. Negative for suicidal ideas, sleep disturbance, dysphoric mood and agitation. The patient is nervous/anxious.        Objective:   Physical Exam  Constitutional: He is oriented to person, place, and time. He appears well-developed. No distress.  Obese, appears chronically ill and tired NAD  HENT:  Mouth/Throat: Oropharynx is clear and moist.  Eyes: Conjunctivae are normal. Pupils are equal, round, and reactive to light.  Neck: Normal range of motion. No JVD present. No thyromegaly present.  Cardiovascular: Normal rate, regular rhythm, normal heart sounds and intact distal pulses.  Exam reveals no gallop and no friction rub.   No murmur heard. Pulmonary/Chest: Effort normal and breath sounds normal. No respiratory distress. He has no wheezes. He has no rales. He exhibits no tenderness.  Abdominal: Soft. Bowel sounds are normal. He exhibits no distension and no mass. There is no tenderness. There is no rebound and no guarding.  Musculoskeletal: Normal range of motion. He exhibits edema. He exhibits no tenderness.  Lymphadenopathy:    He has no cervical adenopathy.  Neurological: He is alert and oriented to person, place, and time. He has normal reflexes. No cranial nerve deficit. He exhibits normal muscle tone. He displays a negative Romberg sign. Coordination and gait normal.  No meningeal signs  Skin: Skin is warm and dry. No rash noted.  Psychiatric: He has a normal mood and affect. His  behavior is normal. Judgment and thought content normal.  Using a walker Trace edema B R knee is tender w/ROM Depressed, quiet   Lab Results  Component Value Date   WBC 8.6 03/27/2015   HGB 8.9* 03/27/2015   HCT 28.9*  03/27/2015   PLT 289 03/27/2015   GLUCOSE 77 03/27/2015   CHOL 269* 06/24/2014   TRIG 133.0 06/24/2014   HDL 56.50 06/24/2014   LDLDIRECT 177.9 06/17/2010   LDLCALC 186* 06/24/2014   ALT 113* 03/18/2015   AST 66* 03/18/2015   NA 145 03/27/2015   K 3.3* 03/27/2015   CL 110 03/27/2015   CREATININE 2.05* 03/27/2015   BUN 18 03/27/2015   CO2 27 03/27/2015   TSH 1.458 03/26/2015   PSA 0.01* 11/18/2011   INR 4.94* 04/14/2015   HGBA1C 5.8 06/24/2014          Assessment & Plan:

## 2015-04-21 NOTE — Assessment & Plan Note (Signed)
Chronic  Amlodipine, Lipitor, Coreg, Hydralazine, Demadex

## 2015-04-21 NOTE — Assessment & Plan Note (Signed)
On B12 

## 2015-04-21 NOTE — Assessment & Plan Note (Signed)
?  OA Dr [name]

## 2015-04-22 ENCOUNTER — Other Ambulatory Visit: Payer: Self-pay | Admitting: Internal Medicine

## 2015-04-22 NOTE — Telephone Encounter (Signed)
i dont show this to be on patients med list, do you think patients cardiologist is currently prescribing for him?  Do you want to refill?  Please advise, thanks

## 2015-04-23 ENCOUNTER — Telehealth: Payer: Self-pay | Admitting: Internal Medicine

## 2015-04-23 DIAGNOSIS — I480 Paroxysmal atrial fibrillation: Secondary | ICD-10-CM | POA: Diagnosis not present

## 2015-04-23 DIAGNOSIS — I5020 Unspecified systolic (congestive) heart failure: Secondary | ICD-10-CM | POA: Diagnosis not present

## 2015-04-23 DIAGNOSIS — E119 Type 2 diabetes mellitus without complications: Secondary | ICD-10-CM | POA: Diagnosis not present

## 2015-04-23 DIAGNOSIS — I129 Hypertensive chronic kidney disease with stage 1 through stage 4 chronic kidney disease, or unspecified chronic kidney disease: Secondary | ICD-10-CM | POA: Diagnosis not present

## 2015-04-23 DIAGNOSIS — Z48812 Encounter for surgical aftercare following surgery on the circulatory system: Secondary | ICD-10-CM | POA: Diagnosis not present

## 2015-04-23 DIAGNOSIS — N183 Chronic kidney disease, stage 3 (moderate): Secondary | ICD-10-CM | POA: Diagnosis not present

## 2015-04-23 NOTE — Telephone Encounter (Signed)
Faxed info to linda[facility] per her request

## 2015-04-23 NOTE — Telephone Encounter (Signed)
Frank Mclean - [facility]   They need 5/2 labs for A1c Faxed over to [phone]

## 2015-04-25 ENCOUNTER — Ambulatory Visit (INDEPENDENT_AMBULATORY_CARE_PROVIDER_SITE_OTHER): Payer: Medicare Other

## 2015-04-25 ENCOUNTER — Telehealth: Payer: Self-pay | Admitting: Internal Medicine

## 2015-04-25 ENCOUNTER — Telehealth: Payer: Self-pay | Admitting: *Deleted

## 2015-04-25 DIAGNOSIS — Z5181 Encounter for therapeutic drug level monitoring: Secondary | ICD-10-CM

## 2015-04-25 DIAGNOSIS — I480 Paroxysmal atrial fibrillation: Secondary | ICD-10-CM | POA: Diagnosis not present

## 2015-04-25 DIAGNOSIS — I5020 Unspecified systolic (congestive) heart failure: Secondary | ICD-10-CM | POA: Diagnosis not present

## 2015-04-25 DIAGNOSIS — I6350 Cerebral infarction due to unspecified occlusion or stenosis of unspecified cerebral artery: Secondary | ICD-10-CM

## 2015-04-25 DIAGNOSIS — N183 Chronic kidney disease, stage 3 (moderate): Secondary | ICD-10-CM | POA: Diagnosis not present

## 2015-04-25 DIAGNOSIS — Z7901 Long term (current) use of anticoagulants: Secondary | ICD-10-CM

## 2015-04-25 DIAGNOSIS — I639 Cerebral infarction, unspecified: Secondary | ICD-10-CM

## 2015-04-25 DIAGNOSIS — E119 Type 2 diabetes mellitus without complications: Secondary | ICD-10-CM | POA: Diagnosis not present

## 2015-04-25 DIAGNOSIS — I129 Hypertensive chronic kidney disease with stage 1 through stage 4 chronic kidney disease, or unspecified chronic kidney disease: Secondary | ICD-10-CM | POA: Diagnosis not present

## 2015-04-25 DIAGNOSIS — Z48812 Encounter for surgical aftercare following surgery on the circulatory system: Secondary | ICD-10-CM | POA: Diagnosis not present

## 2015-04-25 LAB — POCT INR: INR: 2.8

## 2015-04-25 NOTE — Telephone Encounter (Signed)
Return fax sent for patient order for "[place]"

## 2015-04-25 NOTE — Telephone Encounter (Signed)
Andy/home health wanted to make you aware of bp readings on patient today---i have called patient again and he states he is feeling fine with no shortness of breath, chest pain, or any other complications---he really does not want to come in for office visit at this time---do you have any other instructions for him, please advise and i will call him back, thanks

## 2015-04-25 NOTE — Telephone Encounter (Signed)
[facility] - Client TELEPHONE ADVICE RECORD [facility] Patient Name: LASHON DOBIAS DOB: [birth year] Initial Comment Caller states husband is out of hospital, d/c pace maker put in and then pneumonia; BP was 180/100; history of stroke and heart attack; Nurse Assessment Nurse: Ladona Ridgel, RN, Sunny Schlein Date/Time (Eastern Time): 04/25/2015 3:09:06 PM Confirm and document reason for call. If symptomatic, describe symptoms. ---Pt has been home from hospital for 2 1/2 weeks ago (for pacemaker placed and pneumonia). Caller is not at home, but husband had BP of 180/100 so he could not have his PT. Has the patient traveled out of the country within the last 30 days? ---No Does the patient require triage? ---Yes Related visit to physician within the last 2 weeks? ---Yes Does the PT have any chronic conditions? (i.e. diabetes, asthma, etc.) ---Yes List chronic conditions. ---DM h/o MI and cvaNurse: Gaddy, RN, Felicia Date/Time (Eastern Time): 04/25/2015 3:14:11 PM Confirm and document reason for call. If symptomatic, describe symptoms. ---PT has 180/100 blood pressure. PT was SOB and that is when they put pacemaker in . after that he was readmitted for pneumonia. Discharged 2 1/2 weeks ago Has the patient traveled out of the country within the last 30 days? ---No Does the patient require triage? ---Yes Related visit to physician within the last 2 weeks? ---Yes Does the PT have any chronic conditions? (i.e. diabetes, asthma, etc.) ---Yes List chronic conditions. ---DM, previous MI and previous cva pacemaker Guidelines Guideline Title Affirmed Question Affirmed Notes Pneumonia on Antibiotic Post- Hospitalization Follow-up Call History of any of the following: kidney disease, liver disease, heart failure (CHF), stroke, or current cancer. High Blood Pressure BP # 180/110 Final Disposition User See Physician within 24 Hours Gaddy, RN, FeliciaComments Pt at 336  [phone] Call Id: [id number]

## 2015-04-25 NOTE — Telephone Encounter (Signed)
Beginning of session 182/100 A symptomatic  Waited 20 minutes - sat -  140/90 Asked patient if normal and patient said yes.   Is requesting a call to go to patient to check on him.

## 2015-04-28 ENCOUNTER — Telehealth: Payer: Self-pay | Admitting: *Deleted

## 2015-04-28 NOTE — Telephone Encounter (Signed)
-----   Message from Tresa Garter, MD sent at 04/24/2015 10:14 PM EDT ----- [name], please, ask pt if he is taking amiodarone now - not on the list (there is a refill request) Thx

## 2015-04-28 NOTE — Telephone Encounter (Signed)
Left mess for patient to call back.  

## 2015-04-28 NOTE — Telephone Encounter (Signed)
[facility] - Client TELEPHONE ADVICE RECORD [facility] Patient Name: Frank Mclean Gender: Male DOB: [birth year] Age: 74 Y 10 M 29 D Return Phone Number: [phone] (Primary) Address: [address] City/State/Zip: [place] [address] Client [facility] - Client Client Site [facility] Day Physician Plotnikov, Alex Contact Type Call Call Type Triage / Clinical Caller Name Needham Biggins Relationship To Patient Spouse Appointment Disposition EMR Appointment Attempted - Not Scheduled Info pasted into Epic Yes Return Phone Number [phone] (Primary) Chief Complaint Blood Pressure High Initial Comment Caller states husband is out of hospital, d/c pace maker put in and then pneumonia; BP was 180/100; history of stroke and heart attack; PreDisposition Go to ED Nurse Assessment Nurse: Ladona Ridgel, RN, Sunny Schlein Date/Time (Eastern Time): 04/25/2015 3:09:06 PM Confirm and document reason for call. If symptomatic, describe symptoms. ---Pt has been home from hospital for 2 1/2 weeks ago (for pacemaker placed and pneumonia). Caller is not at home, but husband had BP of 180/100 so he could not have his PT. Has the patient traveled out of the country within the last 30 days? ---No Does the patient require triage? ---Yes Related visit to physician within the last 2 weeks? ---Yes Does the PT have any chronic conditions? (i.e. diabetes, asthma, etc.) ---Yes List chronic conditions. ---DM h/o MI and cva Nurse: Ladona Ridgel, RN, Felicia Date/Time (Eastern Time): 04/25/2015 3:14:11 PM Confirm and document reason for call. If symptomatic, describe symptoms. ---PT has 180/100 blood pressure. PT was SOB and that is when they put pacemaker in . after that he was readmitted for pneumonia. Discharged 2 1/2 weeks ago Has the patient traveled out of the country within the last 30 days? ---No Does the patient require triage? ---Yes Related  visit to physician within the last 2 weeks? ---Yes Does the PT have any chronic conditions? (i.e. diabetes, asthma, etc.) ---Yes List chronic conditions. ---DM, previous MI and previous cva pacemaker PLEASE NOTE: All timestamps contained within this report are represented as [place] Standard Time. CONFIDENTIALTY NOTICE: This fax transmission is intended only for the addressee. It contains information that is legally privileged, confidential or otherwise protected from use or disclosure. If you are not the intended recipient, you are strictly prohibited from reviewing, disclosing, copying using or disseminating any of this information or taking any action in reliance on or regarding this information. If you have received this fax in error, please notify us immediately by telephone so that we can arrange for its return to [place]. Phone: [phone], Toll-Free: [phone], Fax: [phone] Page: 2 of 3 Call Id: [id number] Guidelines Guideline Title Affirmed Question Affirmed Notes Nurse Date/Time Lamount Cohen Time) Pneumonia on Antibiotic Post-Hospitalization Follow-up Call History of any of the following: kidney disease, liver disease, heart failure (CHF), stroke, or current cancer. Ladona Ridgel, RN, [facility] 04/25/2015 3:16:07 PM High Blood Pressure BP # 180/110 Loree Fee, [facility] 04/25/2015 3:19:24 PM Disp. Time Lamount Cohen Time) Disposition Final User 04/25/2015 3:18:59 PM See PCP within 2 Lynford Citizen, RN, [facility] 04/25/2015 3:30:59 PM Send To RN Personal Ladona Ridgel, RN, Felicia 04/25/2015 4:15:13 PM Call Completed Ladona Ridgel, RN, Sunny Schlein 04/25/2015 3:30:12 PM See Physician within 24 Hours Yes Ladona Ridgel, RN, Lavena Stanford Understands: Yes Disagree/Comply: Marine scientist Understands: Yes Disagree/Comply: Comply Care Advice Given Per Guideline SEE PCP WITHIN 2 WEEKS: You need an evaluation for this ongoing problem within the next 2 weeks. Call your doctor during regular office hours and make an appointment. * Fever occurs *  New  or worsening difficulty breathing * Coughing or feeling tired that lasts more than 3 weeks * You become worse. SEE PHYSICIAN WITHIN 24 HOURS: * Untreated high blood pressure may cause damage to your heart, brain, kidneys, and eyes. * Treatment of high blood pressure can reduce the risk of stroke, heart attack, and heart failure. * The goal of blood pressure treatment for most patients with hypertension is to keep the blood pressure under 140/90. * Weakness or numbness of the face, arm or leg on one side of the body occurs * Difficulty walking, difficulty talking, or severe headache occurs * Chest pain or difficulty breathing occurs * You become worse. After Care Instructions Given Call Event Type User Date / Time Description Comments User: Hampton Abbot, RN Date/Time Lamount Cohen Time): 04/25/2015 3:12:54 PM Pt at [phone] User: Hampton Abbot, RN Date/Time Lamount Cohen Time): 04/25/2015 3:31:09 PM User: Hampton Abbot, RN Date/Time Lamount Cohen Time): 04/25/2015 4:07:29 PM PLEASE NOTE: All timestamps contained within this report are represented as [place] Standard Time. CONFIDENTIALTY NOTICE: This fax transmission is intended only for the addressee. It contains information that is legally privileged, confidential or otherwise protected from use or disclosure. If you are not the intended recipient, you are strictly prohibited from reviewing, disclosing, copying using or disseminating any of this information or taking any action in reliance on or regarding this information. If you have received this fax in error, please notify us immediately by telephone so that we can arrange for its return to [place]. Phone: [phone], Toll-Free: [phone], Fax: [phone] Page: 3 of 3 Call Id: [id number] Comments since cardiologist arent in office anyway on Saturdays will tell caller about Sat [facility] User: Hampton Abbot, RN Date/Time Lamount Cohen Time): 04/25/2015 4:11:14 PM Reached Brother in Social worker and he said that  [place] at Dr. Loren Racer is already working on getting appt for tom

## 2015-04-29 DIAGNOSIS — I129 Hypertensive chronic kidney disease with stage 1 through stage 4 chronic kidney disease, or unspecified chronic kidney disease: Secondary | ICD-10-CM | POA: Diagnosis not present

## 2015-04-29 DIAGNOSIS — Z48812 Encounter for surgical aftercare following surgery on the circulatory system: Secondary | ICD-10-CM | POA: Diagnosis not present

## 2015-04-29 DIAGNOSIS — N183 Chronic kidney disease, stage 3 (moderate): Secondary | ICD-10-CM | POA: Diagnosis not present

## 2015-04-29 DIAGNOSIS — E119 Type 2 diabetes mellitus without complications: Secondary | ICD-10-CM | POA: Diagnosis not present

## 2015-04-29 DIAGNOSIS — I5020 Unspecified systolic (congestive) heart failure: Secondary | ICD-10-CM | POA: Diagnosis not present

## 2015-04-29 DIAGNOSIS — I480 Paroxysmal atrial fibrillation: Secondary | ICD-10-CM | POA: Diagnosis not present

## 2015-04-30 DIAGNOSIS — N183 Chronic kidney disease, stage 3 (moderate): Secondary | ICD-10-CM | POA: Diagnosis not present

## 2015-04-30 DIAGNOSIS — I480 Paroxysmal atrial fibrillation: Secondary | ICD-10-CM | POA: Diagnosis not present

## 2015-04-30 DIAGNOSIS — I5020 Unspecified systolic (congestive) heart failure: Secondary | ICD-10-CM | POA: Diagnosis not present

## 2015-04-30 DIAGNOSIS — Z48812 Encounter for surgical aftercare following surgery on the circulatory system: Secondary | ICD-10-CM | POA: Diagnosis not present

## 2015-04-30 DIAGNOSIS — I129 Hypertensive chronic kidney disease with stage 1 through stage 4 chronic kidney disease, or unspecified chronic kidney disease: Secondary | ICD-10-CM | POA: Diagnosis not present

## 2015-04-30 DIAGNOSIS — E119 Type 2 diabetes mellitus without complications: Secondary | ICD-10-CM | POA: Diagnosis not present

## 2015-05-01 ENCOUNTER — Telehealth: Payer: Self-pay | Admitting: Cardiology

## 2015-05-01 NOTE — Telephone Encounter (Signed)
Pt c/o swelling: STAT is pt has developed SOB within 24 hours  1. How long have you been experiencing swelling? This morning  2. Where is the swelling located? Right hand  3.  Are you currently taking a "fluid pill"? Yes  4.  Are you currently SOB? No  5.  Have you traveled recently? No

## 2015-05-01 NOTE — Telephone Encounter (Signed)
Pt.s wife encouraged to call PCP, pt.s wife stated PCP is out of town and wanted to talk to [employer] [place] she stated she already had the number

## 2015-05-02 DIAGNOSIS — I5020 Unspecified systolic (congestive) heart failure: Secondary | ICD-10-CM | POA: Diagnosis not present

## 2015-05-02 DIAGNOSIS — N183 Chronic kidney disease, stage 3 (moderate): Secondary | ICD-10-CM | POA: Diagnosis not present

## 2015-05-02 DIAGNOSIS — E119 Type 2 diabetes mellitus without complications: Secondary | ICD-10-CM | POA: Diagnosis not present

## 2015-05-02 DIAGNOSIS — I480 Paroxysmal atrial fibrillation: Secondary | ICD-10-CM | POA: Diagnosis not present

## 2015-05-02 DIAGNOSIS — Z48812 Encounter for surgical aftercare following surgery on the circulatory system: Secondary | ICD-10-CM | POA: Diagnosis not present

## 2015-05-02 DIAGNOSIS — I129 Hypertensive chronic kidney disease with stage 1 through stage 4 chronic kidney disease, or unspecified chronic kidney disease: Secondary | ICD-10-CM | POA: Diagnosis not present

## 2015-05-06 DIAGNOSIS — I480 Paroxysmal atrial fibrillation: Secondary | ICD-10-CM | POA: Diagnosis not present

## 2015-05-06 DIAGNOSIS — E119 Type 2 diabetes mellitus without complications: Secondary | ICD-10-CM | POA: Diagnosis not present

## 2015-05-06 DIAGNOSIS — N183 Chronic kidney disease, stage 3 (moderate): Secondary | ICD-10-CM | POA: Diagnosis not present

## 2015-05-06 DIAGNOSIS — I5020 Unspecified systolic (congestive) heart failure: Secondary | ICD-10-CM | POA: Diagnosis not present

## 2015-05-06 DIAGNOSIS — I129 Hypertensive chronic kidney disease with stage 1 through stage 4 chronic kidney disease, or unspecified chronic kidney disease: Secondary | ICD-10-CM | POA: Diagnosis not present

## 2015-05-06 DIAGNOSIS — Z48812 Encounter for surgical aftercare following surgery on the circulatory system: Secondary | ICD-10-CM | POA: Diagnosis not present

## 2015-05-07 ENCOUNTER — Other Ambulatory Visit: Payer: Self-pay | Admitting: Internal Medicine

## 2015-05-07 DIAGNOSIS — Z48812 Encounter for surgical aftercare following surgery on the circulatory system: Secondary | ICD-10-CM | POA: Diagnosis not present

## 2015-05-07 DIAGNOSIS — I129 Hypertensive chronic kidney disease with stage 1 through stage 4 chronic kidney disease, or unspecified chronic kidney disease: Secondary | ICD-10-CM | POA: Diagnosis not present

## 2015-05-07 DIAGNOSIS — I480 Paroxysmal atrial fibrillation: Secondary | ICD-10-CM | POA: Diagnosis not present

## 2015-05-07 DIAGNOSIS — E119 Type 2 diabetes mellitus without complications: Secondary | ICD-10-CM | POA: Diagnosis not present

## 2015-05-07 DIAGNOSIS — N183 Chronic kidney disease, stage 3 (moderate): Secondary | ICD-10-CM | POA: Diagnosis not present

## 2015-05-07 DIAGNOSIS — I5020 Unspecified systolic (congestive) heart failure: Secondary | ICD-10-CM | POA: Diagnosis not present

## 2015-05-08 ENCOUNTER — Encounter: Payer: Self-pay | Admitting: Family Medicine

## 2015-05-08 ENCOUNTER — Other Ambulatory Visit (INDEPENDENT_AMBULATORY_CARE_PROVIDER_SITE_OTHER): Payer: Medicare Other

## 2015-05-08 ENCOUNTER — Ambulatory Visit (INDEPENDENT_AMBULATORY_CARE_PROVIDER_SITE_OTHER): Payer: Medicare Other | Admitting: Family Medicine

## 2015-05-08 VITALS — BP 130/80 | HR 77 | Ht 74.5 in | Wt 210.0 lb

## 2015-05-08 DIAGNOSIS — M25561 Pain in right knee: Secondary | ICD-10-CM

## 2015-05-08 DIAGNOSIS — M129 Arthropathy, unspecified: Secondary | ICD-10-CM | POA: Diagnosis not present

## 2015-05-08 DIAGNOSIS — IMO0002 Reserved for concepts with insufficient information to code with codable children: Secondary | ICD-10-CM | POA: Insufficient documentation

## 2015-05-08 DIAGNOSIS — I639 Cerebral infarction, unspecified: Secondary | ICD-10-CM | POA: Diagnosis not present

## 2015-05-08 NOTE — Progress Notes (Signed)
Pre visit review using our clinic review tool, if applicable. No additional management support is needed unless otherwise documented below in the visit note. 

## 2015-05-08 NOTE — Assessment & Plan Note (Signed)
Patient does have mild to moderate osteophytic changes of this knee. I do believe that some of the swelling on patient's lower extremity from his congestive heart failure is likely giving him some of the discomfort. Patient is also had some fatigue as well as some muscle wasting with him being in the hospital. Patient will slowly increase some over-the-counter medications but will avoid making any drastic changes. In addition of this patient will increase his diuretic for 3 days and go back to the regular dose. Patient will be following up with his cardiac physician in the near future. Patient work with Event organiser today to learn home exercises. Patient will try medial unloader brace. Patient will try topical anti-inflammatories. Patient and will come back in 3 weeks and if continuing to have discomfort I would consider injection.

## 2015-05-08 NOTE — Progress Notes (Signed)
[facility] [address] [place], [place] [address] Phone: [phone] Subjective:    I'm seeing this patient by the request  of:  [name], MD   CC: Right knee pain  [id number] Frank Mclean is a 74 y.o. male coming in with complaint of right knee pain. Patient states he has had this pain for quite some time. Patient does have a history of gout but denies any significant flares recently. Patient noticed the pain significantly more after a recent hospitalization for congestive heart failure. Patient states recently and has started to increase in range of motion and is actually being able to do a little more activity. Patient does ambulate with the aid of a cane. Patient denies any numbness. Denies any giving out on him. Patient states though that things such as going up stairs can be very difficult. Patient is also noticed swelling that this could be secondary to his heart he states. Rates the severity of pain a 6 out of 10.  Past Medical History  Diagnosis Date  . Gout   . HTN (hypertension)   . Hyperlipidemia   . Osteoarthritis   . History of CVA (cerebrovascular accident)     Left pontine infarct July 2004; changed from Plavix to Coumadin in 2004 per MD at [facility]  . GERD (gastroesophageal reflux disease)   . DJD (degenerative joint disease)   . BPH (benign prostatic hypertrophy)   . Peripheral vascular disease   . Bilateral leg ulcer     ACHILLES AREA--  NONHEALING  . CKD (chronic kidney disease) stage 3, GFR 30-59 ml/min   . CAD (coronary artery disease)     a. LHC 4/12: Mid LAD 25%, mid diagonal 30%, AV circumflex 40%, proximal OM 25%, distal RCA 40%  . Chronic combined systolic and diastolic heart failure     a. Echo 05/2013: EF 25-30%, diffuse HK, restrictive physiology, trivial AI, mild MR, moderate LAE, reduced RV systolic function, PASP 42  . LBBB (left bundle branch block)   . PAF (paroxysmal atrial fibrillation)     a. amiodarone  Rx started 05/2013;  b. chronic coumadin  . NICM (nonischemic cardiomyopathy)     a. EF 25-30%.  [name] Hx of cardiovascular stress test     Nuclear Stress Test (1/16): High risk stress nuclear study with a large, severe, partially reversible inferior and apical defect consistent with prior inferior and apical infarct; mild apical ischemia; severe LVE; study high risk due to reduced LV function.  EF 25% >> reviewed with Dr. Antoine Poche >> medical mgmt  . Hypothyroidism   . DM (diabetes mellitus), type 2    Past Surgical History  Procedure Laterality Date  . Cardiac catheterization  04-16-2011   DR [facility]    NON-OBSTRUCTIVE CAD. MILDLY ELEVATED PULMONARY PRESSURES/ ELEVATED  END-DIASTOLIC PRESSURE  . Transthoracic echocardiogram  12-31-2010    MODERATE CONCENTRIC LVH/ SYSTOLIC FUNCTION SEVERELY REDUCED/ EF 25-30%/  SEVERE HYPOKINESIS OF ANTEROSEPTAL MYOCARDIUM  AND ENTIREAPICAL MYOCARDIUM /  MODERATE HYPOKINESIS OF LATERAL, INFEROLATERAL, INFERIOR,AND INFEROSEPTAL MYOCARIUM/  GRADE 3 DIASTOLIC DYSFUNCTION/ MILD MR  . Aortogram w/ bilateral lower extremitiy runoff  05-18-2013  DR FIELDS    LEFT LEG OCCLUDED PERONEAL AND ANTERIOR TIBIAL ARTERIES/ HIGH GRADE STENOIS 80% MIDDLE AND DISTAL THIRD OF POSTERIOR TIBIAL ARTERY/ RIGHT PERONEAL AND ANTERIOR TIBIAL ARTERY OCCLUDED/ 40% STENOSIS DISTALLY   . Abdominal aortagram N/A 05/18/2013    Procedure: ABDOMINAL Ronny Flurry;  Surgeon: Sherren Kerns, MD;  Location: [facility] CATH  LAB;  Service: Cardiovascular;  Laterality: N/A;  . Lower extremity angiogram Bilateral 05/18/2013    Procedure: LOWER EXTREMITY ANGIOGRAM;  Surgeon: Sherren Kerns, MD;  Location: [facility] CATH LAB;  Service: Cardiovascular;  Laterality: Bilateral;  . Right heart catheterization N/A 02/13/2015    Procedure: RIGHT HEART CATH;  Surgeon: Laurey Morale, MD;  Location: [facility] CATH LAB;  Service: Cardiovascular;  Laterality: N/A;  . Bi-ventricular implantable cardioverter defibrillator N/A 03/12/2015     Procedure: BI-VENTRICULAR IMPLANTABLE CARDIOVERTER DEFIBRILLATOR  (CRT-D);  Surgeon: Marinus Maw, MD;  Location: [facility] CATH LAB;  Service: Cardiovascular;  Laterality: N/A;   History  Substance Use Topics  . Smoking status: Former Smoker    Quit date: 04/12/1974  . Smokeless tobacco: Never Used  . Alcohol Use: No     Comment: previously drank - "love cognac" quit 15 yrs ago.   Allergies  Allergen Reactions  . Levothyroxine Sodium Other (See Comments)    MUST TAKE BRAND NAME GENERIC DOESN'T WORK FOR PATIENT  . Chicken Protein     Does not like chicken or [place]  . Eggs Or Egg-Derived Products     Does not like eggs   Family History  Problem Relation Age of Onset  . Hypertension Mother   . Heart disease Mother   . Diabetes Mother   . Gout Other   . Stroke Other   . Hypertension Father   . Heart disease Father   . Heart attack Maternal Grandmother   . Thyroid disease Neg Hx         Past medical history, social, surgical and family history all reviewed in electronic medical record.   Review of Systems: No headache, visual changes, nausea, vomiting, diarrhea, constipation, dizziness, abdominal pain, skin rash, fevers, chills, night sweats, weight loss, swollen lymph nodes, body aches, joint swelling, muscle aches, chest pain, shortness of breath, mood changes.   Objective Blood pressure 130/80, pulse 77, height 6' 2.5" (1.892 m), weight 210 lb (95.255 kg), SpO2 97 %.  General: No apparent distress alert and oriented x3 mood and affect normal, dressed appropriately.  HEENT: Pupils equal, extraocular movements intact  Respiratory: Patient's speak in full sentences and does not appear short of breath  Cardiovascular: 2+ pitting lower extremity edema, non tender, no erythema  Skin: Warm dry intact with no signs of infection or rash on extremities or on axial skeleton.  Abdomen: Soft nontender  Neuro: Cranial nerves II through XII are intact, neurovascularly intact in all  extremities with 2+ DTRs and 2+ pulses.  Lymph: No lymphadenopathy of posterior or anterior cervical chain or axillae bilaterally.  Gait slow and antalgic  MSK:  Non tender with full range of motion and good stability and symmetric strength and tone of shoulders, elbows, wrist, hip, and ankles bilaterally.  Knee:right Edema noted but no true effusion Palpation normal with no warmth, joint line tenderness, patellar tenderness, or condyle tenderness. ROM full in flexion and extension and lower leg rotation. Ligaments with solid consistent endpoints including ACL, PCL, LCL, MCL. Negative Mcmurray's, Apley's, and Thessalonian tests. painful patellar compression. Patellar glide with mild crepitus. Patellar and quadriceps tendons unremarkable. Hamstring and quadriceps strength is normal.    MSK US performed of: right This study was ordered, performed, and interpreted by Terrilee Files D.O.  Knee: All structures visualized. Moderate narrowing of the medial and lateral joint line but no significant meniscal injury. Patellar Tendon unremarkable on long and transverse views without effusion. No abnormality of prepatellar bursa. LCL and  MCL unremarkable on long and transverse views. No abnormality of origin of medial or lateral head of the gastrocnemius.  IMPRESSION:  Moderate narrowing of joint space bilaterally.    Impression and Recommendations:     This case required medical decision making of moderate complexity.

## 2015-05-08 NOTE — Patient Instructions (Signed)
Good to see you.  Ice 20 minutes 2 times daily. Usually after activity and before bed. Exercises 3 times a week.  Try brace daily with a lot of walking Take tylenol 500 mg three times a day is the best evidence based medicine we have for arthritis.  Glucosamine sulfate 1500mg  twice a day is a supplement that has been shown to help moderate to severe arthritis. Vitamin D 2000 IU daily Cortisone injections are an option if these interventions do not seem to make a difference or need more relief.  If cortisone injections do not help, there are different types of shots that may help but they take longer to take effect.  We can discuss this at follow up.  It's important that you continue to stay active. Controlling your weight is important.  Consider physical therapy to strengthen muscles around the joint that hurts to take pressure off of the joint itself. Water aerobics and cycling with low resistance are the best two types of exercise for arthritis. Come back and see me in 3 weeks.

## 2015-05-09 ENCOUNTER — Ambulatory Visit (INDEPENDENT_AMBULATORY_CARE_PROVIDER_SITE_OTHER): Payer: Medicare Other

## 2015-05-09 DIAGNOSIS — I129 Hypertensive chronic kidney disease with stage 1 through stage 4 chronic kidney disease, or unspecified chronic kidney disease: Secondary | ICD-10-CM | POA: Diagnosis not present

## 2015-05-09 DIAGNOSIS — Z7901 Long term (current) use of anticoagulants: Secondary | ICD-10-CM | POA: Diagnosis not present

## 2015-05-09 DIAGNOSIS — I5020 Unspecified systolic (congestive) heart failure: Secondary | ICD-10-CM | POA: Diagnosis not present

## 2015-05-09 DIAGNOSIS — Z5181 Encounter for therapeutic drug level monitoring: Secondary | ICD-10-CM

## 2015-05-09 DIAGNOSIS — I6350 Cerebral infarction due to unspecified occlusion or stenosis of unspecified cerebral artery: Secondary | ICD-10-CM

## 2015-05-09 DIAGNOSIS — I639 Cerebral infarction, unspecified: Secondary | ICD-10-CM

## 2015-05-09 DIAGNOSIS — E119 Type 2 diabetes mellitus without complications: Secondary | ICD-10-CM | POA: Diagnosis not present

## 2015-05-09 DIAGNOSIS — I480 Paroxysmal atrial fibrillation: Secondary | ICD-10-CM | POA: Diagnosis not present

## 2015-05-09 DIAGNOSIS — Z48812 Encounter for surgical aftercare following surgery on the circulatory system: Secondary | ICD-10-CM | POA: Diagnosis not present

## 2015-05-09 DIAGNOSIS — N183 Chronic kidney disease, stage 3 (moderate): Secondary | ICD-10-CM | POA: Diagnosis not present

## 2015-05-09 LAB — POCT INR: INR: 2

## 2015-05-13 DIAGNOSIS — I129 Hypertensive chronic kidney disease with stage 1 through stage 4 chronic kidney disease, or unspecified chronic kidney disease: Secondary | ICD-10-CM | POA: Diagnosis not present

## 2015-05-13 DIAGNOSIS — E119 Type 2 diabetes mellitus without complications: Secondary | ICD-10-CM | POA: Diagnosis not present

## 2015-05-13 DIAGNOSIS — N183 Chronic kidney disease, stage 3 (moderate): Secondary | ICD-10-CM | POA: Diagnosis not present

## 2015-05-13 DIAGNOSIS — Z48812 Encounter for surgical aftercare following surgery on the circulatory system: Secondary | ICD-10-CM | POA: Diagnosis not present

## 2015-05-13 DIAGNOSIS — I480 Paroxysmal atrial fibrillation: Secondary | ICD-10-CM | POA: Diagnosis not present

## 2015-05-13 DIAGNOSIS — I5020 Unspecified systolic (congestive) heart failure: Secondary | ICD-10-CM | POA: Diagnosis not present

## 2015-05-16 ENCOUNTER — Encounter: Payer: Self-pay | Admitting: Cardiology

## 2015-05-16 ENCOUNTER — Ambulatory Visit (INDEPENDENT_AMBULATORY_CARE_PROVIDER_SITE_OTHER): Payer: Medicare Other | Admitting: Cardiology

## 2015-05-16 VITALS — BP 100/60 | HR 64 | Ht 74.5 in | Wt 215.1 lb

## 2015-05-16 DIAGNOSIS — E119 Type 2 diabetes mellitus without complications: Secondary | ICD-10-CM | POA: Diagnosis not present

## 2015-05-16 DIAGNOSIS — E878 Other disorders of electrolyte and fluid balance, not elsewhere classified: Secondary | ICD-10-CM | POA: Diagnosis not present

## 2015-05-16 DIAGNOSIS — Z48812 Encounter for surgical aftercare following surgery on the circulatory system: Secondary | ICD-10-CM | POA: Diagnosis not present

## 2015-05-16 DIAGNOSIS — R0602 Shortness of breath: Secondary | ICD-10-CM

## 2015-05-16 DIAGNOSIS — I639 Cerebral infarction, unspecified: Secondary | ICD-10-CM | POA: Diagnosis not present

## 2015-05-16 DIAGNOSIS — I5020 Unspecified systolic (congestive) heart failure: Secondary | ICD-10-CM | POA: Diagnosis not present

## 2015-05-16 DIAGNOSIS — I480 Paroxysmal atrial fibrillation: Secondary | ICD-10-CM | POA: Diagnosis not present

## 2015-05-16 DIAGNOSIS — I129 Hypertensive chronic kidney disease with stage 1 through stage 4 chronic kidney disease, or unspecified chronic kidney disease: Secondary | ICD-10-CM | POA: Diagnosis not present

## 2015-05-16 DIAGNOSIS — N183 Chronic kidney disease, stage 3 (moderate): Secondary | ICD-10-CM | POA: Diagnosis not present

## 2015-05-16 NOTE — Patient Instructions (Addendum)
Your physician recommends that you schedule a follow-up appointment in: 2 months with an extender  We have labs for you to get done today

## 2015-05-16 NOTE — Progress Notes (Signed)
HPI The patient presents for evaluation of heart failure.   He's had a complicated spring. He had heart failure. He says currently had ICD placement. He had a failed attempt at LV lead placement. There was at one point consideration of an epicardial lead placement but he was felt to be too high risk for this. He had some paroxysmal A. fib. He did go to rehabilitation for recovery after his hospitalization. I reviewed multiple hospital visits including one in between when he was treated for pneumonia. He now thinks he is doing relatively well. He is home. He's not having any acute shortness of breath. He denies any PND or orthopnea. He does weigh himself but not every day. His weights at home seem to be stable. He is trying to avoid salt. He's not having any palpitations, presyncope or syncope. He's not having any chest pressure, neck or arm discomfort.   Allergies  Allergen Reactions  . Levothyroxine Sodium Other (See Comments)    MUST TAKE BRAND NAME GENERIC DOESN'T WORK FOR PATIENT  . Chicken Protein     Does not like chicken or [place]  . Eggs Or Egg-Derived Products     Does not like eggs    Current Outpatient Prescriptions  Medication Sig Dispense Refill  . acetaminophen (TYLENOL) 500 MG tablet Take 500 mg by mouth every 6 (six) hours as needed.    [name] amLODipine (NORVASC) 10 MG tablet Take 1 tablet (10 mg total) by mouth daily. 30 tablet 1  . atorvastatin (LIPITOR) 20 MG tablet Take 1 tablet (20 mg total) by mouth daily. 90 tablet 3  . carvedilol (COREG) 12.5 MG tablet Take 1 tablet (12.5 mg total) by mouth 2 (two) times daily with a meal. 60 tablet 11  . cholecalciferol (VITAMIN D) 1000 UNITS tablet Take 1 tablet (1,000 Units total) by mouth daily. 30 tablet 1  . [employer]S VITAMIN B12 1000 MCG tablet TAKE 1 TABLET BY MOUTH DAILY 100 tablet 1  . escitalopram (LEXAPRO) 5 MG tablet Take 1 tablet (5 mg total) by mouth daily. 30 tablet 5  . ferrous sulfate 325 (65 FE) MG tablet Take 1 tablet (325  mg total) by mouth daily. 30 tablet 6  . glipiZIDE (GLUCOTROL) 5 MG tablet TAKE 1/2 TABLET BY MOUTH TWICE A DAY BEFORE A MEAL 30 tablet 11  . guaiFENesin (MUCINEX) 600 MG 12 hr tablet Take 1 tablet (600 mg total) by mouth 2 (two) times daily. 60 tablet 1  . hydrALAZINE (APRESOLINE) 100 MG tablet Take 1 tablet (100 mg total) by mouth 3 (three) times daily. 90 tablet 11  . HYDROcodone-acetaminophen (NORCO/VICODIN) 5-325 MG per tablet Take 1 tablet by mouth every 6 (six) hours as needed for moderate pain. 60 tablet 0  . isosorbide mononitrate (IMDUR) 60 MG 24 hr tablet Take 1.5 tablets for 90 mg by mouth twice a day. 45 tablet 11  . Multiple Vitamin (MULTIVITAMIN WITH MINERALS) TABS tablet Take 1 tablet by mouth daily.    . OXYGEN Inhale 2.5 L into the lungs See admin instructions. USES OXYGEN EVERY BEDTIME USES DURING DAY ONLY AS NEEDED    . pantoprazole (PROTONIX) 40 MG tablet Take 1 tablet (40 mg total) by mouth 2 (two) times daily. 60 tablet 1  . polyethylene glycol (MIRALAX / GLYCOLAX) packet Take 17 g by mouth daily. 14 each 0  . potassium chloride SA (K-DUR,KLOR-CON) 20 MEQ tablet Take 1 tablet (20 mEq total) by mouth daily. 30 tablet 1  . SYMBICORT 160-4.5  MCG/ACT inhaler INHALE 2 PUFFS INTO THE LUNGS 2 TIMES DAILY 10.2 Inhaler 5  . SYNTHROID 100 MCG tablet TAKE 2 TABLETS BY MOUTH EVERY MORNING BEFORE BREAKFAST 60 tablet 11  . torsemide (DEMADEX) 10 MG tablet Take 1 tablet (10 mg total) by mouth daily. 30 tablet 1  . warfarin (COUMADIN) 5 MG tablet 5 mg tablet daily except one half tablet on Mondays and Fridays 30 tablet 1   No current facility-administered medications for this visit.    Past Medical History  Diagnosis Date  . Gout   . HTN (hypertension)   . Hyperlipidemia   . Osteoarthritis   . History of CVA (cerebrovascular accident)     Left pontine infarct July 2004; changed from Plavix to Coumadin in 2004 per MD at [facility]  . GERD (gastroesophageal reflux disease)   . DJD  (degenerative joint disease)   . BPH (benign prostatic hypertrophy)   . Peripheral vascular disease   . Bilateral leg ulcer     ACHILLES AREA--  NONHEALING  . CKD (chronic kidney disease) stage 3, GFR 30-59 ml/min   . CAD (coronary artery disease)     a. LHC 4/12: Mid LAD 25%, mid diagonal 30%, AV circumflex 40%, proximal OM 25%, distal RCA 40%  . Chronic combined systolic and diastolic heart failure     a. Echo 05/2013: EF 25-30%, diffuse HK, restrictive physiology, trivial AI, mild MR, moderate LAE, reduced RV systolic function, PASP 42  . LBBB (left bundle branch block)   . PAF (paroxysmal atrial fibrillation)     a. amiodarone Rx started 05/2013;  b. chronic coumadin  . NICM (nonischemic cardiomyopathy)     a. EF 25-30%.  [name] Hx of cardiovascular stress test     Nuclear Stress Test (1/16): High risk stress nuclear study with a large, severe, partially reversible inferior and apical defect consistent with prior inferior and apical infarct; mild apical ischemia; severe LVE; study high risk due to reduced LV function.  EF 25% >> reviewed with Dr. Antoine Poche >> medical mgmt  . Hypothyroidism   . DM (diabetes mellitus), type 2     Past Surgical History  Procedure Laterality Date  . Cardiac catheterization  04-16-2011   DR [facility]    NON-OBSTRUCTIVE CAD. MILDLY ELEVATED PULMONARY PRESSURES/ ELEVATED  END-DIASTOLIC PRESSURE  . Transthoracic echocardiogram  12-31-2010    MODERATE CONCENTRIC LVH/ SYSTOLIC FUNCTION SEVERELY REDUCED/ EF 25-30%/  SEVERE HYPOKINESIS OF ANTEROSEPTAL MYOCARDIUM  AND ENTIREAPICAL MYOCARDIUM /  MODERATE HYPOKINESIS OF LATERAL, INFEROLATERAL, INFERIOR,AND INFEROSEPTAL MYOCARIUM/  GRADE 3 DIASTOLIC DYSFUNCTION/ MILD MR  . Aortogram w/ bilateral lower extremitiy runoff  05-18-2013  DR [facility]    LEFT LEG OCCLUDED PERONEAL AND ANTERIOR TIBIAL ARTERIES/ HIGH GRADE STENOIS 80% MIDDLE AND DISTAL THIRD OF POSTERIOR TIBIAL ARTERY/ RIGHT PERONEAL AND ANTERIOR TIBIAL ARTERY OCCLUDED/  40% STENOSIS DISTALLY   . Abdominal aortagram N/A 05/18/2013    Procedure: ABDOMINAL Ronny Flurry;  Surgeon: Sherren Kerns, MD;  Location: [facility] CATH LAB;  Service: Cardiovascular;  Laterality: N/A;  . Lower extremity angiogram Bilateral 05/18/2013    Procedure: LOWER EXTREMITY ANGIOGRAM;  Surgeon: Sherren Kerns, MD;  Location: [facility] CATH LAB;  Service: Cardiovascular;  Laterality: Bilateral;  . Right heart catheterization N/A 02/13/2015    Procedure: RIGHT HEART CATH;  Surgeon: Laurey Morale, MD;  Location: [facility] CATH LAB;  Service: Cardiovascular;  Laterality: N/A;  . Bi-ventricular implantable cardioverter defibrillator N/A 03/12/2015    Procedure: BI-VENTRICULAR IMPLANTABLE CARDIOVERTER DEFIBRILLATOR  (CRT-D);  Surgeon: Sharlot Gowda  Myna Hidalgo, MD;  Location: [facility] CATH LAB;  Service: Cardiovascular;  Laterality: N/A;    ROS:  As stated in the HPI and negative for all other systems.  PHYSICAL EXAM BP 100/60 mmHg  Pulse 64  Ht 6' 2.5" (1.892 m)  Wt 215 lb 1.6 oz (97.569 kg)  BMI 27.26 kg/m2 GENERAL:  Well appearing, but fatigued NECK:  Mild JVD, waveform within normal limits, carotid upstroke brisk and symmetric, no bruits, no thyromegaly LUNGS:  Clear to auscultation bilaterally BACK:  No CVA tenderness CHEST:  Well healed ICD pocket  HEART:  PMI not displaced or sustained,S1 and S2 within normal limits, no S3, no S4, no clicks, no rubs, no murmurs ABD:  Flat, positive bowel sounds normal in frequency in pitch, no bruits, no rebound, no guarding, no midline pulsatile mass, no hepatomegaly, no splenomegaly EXT:  2 plus pulses throughout, left greater than right trace edema, no cyanosis no clubbing.   NEURO:  Nonfocal SKIN:  No rashes no nodules  EKG:  Sinus rhythm, rate 64, left bundle branch block, left axis deviation, no change from previous. 05/16/2015  ASSESSMENT AND PLAN  CONGESTIVE HEART FAILURE -  His blood pressure will not allow med titration. He will continue the meds as listed. We  discussed at length daily weights and when necessary dosing of his diaphoretic. His last metabolic profile demonstrated a potassium of 3.1. His renal function was stable. I will repeat a basic metabolic profile. (Extensive hospital records reviewed)  HYPERTENSION -  This is being managed in the context of treating his CHF   ATRIAL FIBRILLATION:   The patient seems to be maintaining sinus rhythm. He is off of the amiodarone with his profound hypothyroidism.  He will remain on warfarin.  CKD:   His creatinine is stable. I will be checking a basic metabolic profile again today.

## 2015-05-29 ENCOUNTER — Ambulatory Visit (INDEPENDENT_AMBULATORY_CARE_PROVIDER_SITE_OTHER)
Admission: RE | Admit: 2015-05-29 | Discharge: 2015-05-29 | Disposition: A | Discharge status: Home / Self Care | Payer: Medicare Other | Source: Ambulatory Visit | Attending: Family Medicine | Admitting: Family Medicine

## 2015-05-29 ENCOUNTER — Encounter: Payer: Self-pay | Admitting: Family Medicine

## 2015-05-29 ENCOUNTER — Ambulatory Visit (INDEPENDENT_AMBULATORY_CARE_PROVIDER_SITE_OTHER): Payer: Medicare Other | Admitting: Family Medicine

## 2015-05-29 VITALS — BP 102/64 | HR 60 | Ht 74.5 in | Wt 212.0 lb

## 2015-05-29 DIAGNOSIS — M25561 Pain in right knee: Secondary | ICD-10-CM

## 2015-05-29 DIAGNOSIS — I639 Cerebral infarction, unspecified: Secondary | ICD-10-CM | POA: Diagnosis not present

## 2015-05-29 DIAGNOSIS — M25461 Effusion, right knee: Secondary | ICD-10-CM | POA: Diagnosis not present

## 2015-05-29 DIAGNOSIS — M129 Arthropathy, unspecified: Secondary | ICD-10-CM

## 2015-05-29 DIAGNOSIS — IMO0002 Reserved for concepts with insufficient information to code with codable children: Secondary | ICD-10-CM

## 2015-05-29 NOTE — Patient Instructions (Signed)
Good to see you We will get xrays downstairs today to get a baseline Injection today to help the knee Continue the ice, exercises and brace if you can tolerate.  See me again in 3-4 weeks.  If not better I have a different set of injections if needed.

## 2015-05-29 NOTE — Progress Notes (Signed)
[facility] [address] [place], [place] [address] Phone: [phone] Subjective:    I'm seeing this patient by the request  of:  [name], MD   CC: Right knee pain follow-up  [id number] Frank Mclean is a 74 y.o. male coming in with complaint of right knee pain. Patient was treated for presumed arthritis in the knee previously. Patient was given a brace, icing, and home exercises. Patient states that he is about 20% better. Still having pain when the knee seems like it is giving out on him. Patient states that this is still feeling far between. Has never fallen but is walking with the aid of a walker. Patient states that this pain can affect some of his daily activities. Has been wearing the brace religiously.  Past Medical History  Diagnosis Date  . Gout   . HTN (hypertension)   . Hyperlipidemia   . Osteoarthritis   . History of CVA (cerebrovascular accident)     Left pontine infarct July 2004; changed from Plavix to Coumadin in 2004 per MD at [facility]  . GERD (gastroesophageal reflux disease)   . DJD (degenerative joint disease)   . BPH (benign prostatic hypertrophy)   . Peripheral vascular disease   . Bilateral leg ulcer     ACHILLES AREA--  NONHEALING  . CKD (chronic kidney disease) stage 3, GFR 30-59 ml/min   . CAD (coronary artery disease)     a. LHC 4/12: Mid LAD 25%, mid diagonal 30%, AV circumflex 40%, proximal OM 25%, distal RCA 40%  . Chronic combined systolic and diastolic heart failure     a. Echo 05/2013: EF 25-30%, diffuse HK, restrictive physiology, trivial AI, mild MR, moderate LAE, reduced RV systolic function, PASP 42  . LBBB (left bundle branch block)   . PAF (paroxysmal atrial fibrillation)     a. amiodarone Rx started 05/2013;  b. chronic coumadin  . NICM (nonischemic cardiomyopathy)     a. EF 25-30%.  [name] Hx of cardiovascular stress test     Nuclear Stress Test (1/16): High risk stress nuclear study with a large,  severe, partially reversible inferior and apical defect consistent with prior inferior and apical infarct; mild apical ischemia; severe LVE; study high risk due to reduced LV function.  EF 25% >> reviewed with Dr. Antoine Poche >> medical mgmt  . Hypothyroidism   . DM (diabetes mellitus), type 2    Past Surgical History  Procedure Laterality Date  . Cardiac catheterization  04-16-2011   DR [facility]    NON-OBSTRUCTIVE CAD. MILDLY ELEVATED PULMONARY PRESSURES/ ELEVATED  END-DIASTOLIC PRESSURE  . Transthoracic echocardiogram  12-31-2010    MODERATE CONCENTRIC LVH/ SYSTOLIC FUNCTION SEVERELY REDUCED/ EF 25-30%/  SEVERE HYPOKINESIS OF ANTEROSEPTAL MYOCARDIUM  AND ENTIREAPICAL MYOCARDIUM /  MODERATE HYPOKINESIS OF LATERAL, INFEROLATERAL, INFERIOR,AND INFEROSEPTAL MYOCARIUM/  GRADE 3 DIASTOLIC DYSFUNCTION/ MILD MR  . Aortogram w/ bilateral lower extremitiy runoff  05-18-2013  DR [facility]    LEFT LEG OCCLUDED PERONEAL AND ANTERIOR TIBIAL ARTERIES/ HIGH GRADE STENOIS 80% MIDDLE AND DISTAL THIRD OF POSTERIOR TIBIAL ARTERY/ RIGHT PERONEAL AND ANTERIOR TIBIAL ARTERY OCCLUDED/ 40% STENOSIS DISTALLY   . Abdominal aortagram N/A 05/18/2013    Procedure: ABDOMINAL Ronny Flurry;  Surgeon: Sherren Kerns, MD;  Location: [facility] CATH LAB;  Service: Cardiovascular;  Laterality: N/A;  . Lower extremity angiogram Bilateral 05/18/2013    Procedure: LOWER EXTREMITY ANGIOGRAM;  Surgeon: Sherren Kerns, MD;  Location: [facility] CATH LAB;  Service: Cardiovascular;  Laterality: Bilateral;  .  Right heart catheterization N/A 02/13/2015    Procedure: RIGHT HEART CATH;  Surgeon: Laurey Morale, MD;  Location: [facility] CATH LAB;  Service: Cardiovascular;  Laterality: N/A;  . Bi-ventricular implantable cardioverter defibrillator N/A 03/12/2015    Procedure: BI-VENTRICULAR IMPLANTABLE CARDIOVERTER DEFIBRILLATOR  (CRT-D);  Surgeon: Marinus Maw, MD;  Location: [facility] CATH LAB;  Service: Cardiovascular;  Laterality: N/A;   History  Substance Use Topics  .  Smoking status: Former Smoker    Quit date: 04/12/1974  . Smokeless tobacco: Never Used  . Alcohol Use: No     Comment: previously drank - "love cognac" quit 15 yrs ago.   Allergies  Allergen Reactions  . Levothyroxine Sodium Other (See Comments)    MUST TAKE BRAND NAME GENERIC DOESN'T WORK FOR PATIENT  . Chicken Protein     Does not like chicken or [place]  . Eggs Or Egg-Derived Products     Does not like eggs   Family History  Problem Relation Age of Onset  . Hypertension Mother   . Heart disease Mother   . Diabetes Mother   . Gout Other   . Stroke Other   . Hypertension Father   . Heart disease Father   . Heart attack Maternal Grandmother   . Thyroid disease Neg Hx         Past medical history, social, surgical and family history all reviewed in electronic medical record.   Review of Systems: No headache, visual changes, nausea, vomiting, diarrhea, constipation, dizziness, abdominal pain, skin rash, fevers, chills, night sweats, weight loss, swollen lymph nodes, body aches, joint swelling, muscle aches, chest pain, shortness of breath, mood changes.   Objective Blood pressure 102/64, pulse 60, height 6' 2.5" (1.892 m), weight 212 lb (96.163 kg), SpO2 98 %.  General: No apparent distress alert and oriented x3 mood and affect normal, dressed appropriately.  HEENT: Pupils equal, extraocular movements intact  Respiratory: Patient's speak in full sentences and does not appear short of breath  Cardiovascular: 2+ pitting lower extremity edema, non tender, no erythema  Skin: Warm dry intact with no signs of infection or rash on extremities or on axial skeleton.  Abdomen: Soft nontender  Neuro: Cranial nerves II through XII are intact, neurovascularly intact in all extremities with 2+ DTRs and 2+ pulses.  Lymph: No lymphadenopathy of posterior or anterior cervical chain or axillae bilaterally.  Gait slow and antalgic with walker MSK:  Non tender with full range of motion and  good stability and symmetric strength and tone of shoulders, elbows, wrist, hip, and ankles bilaterally.  Knee:right Edema noted but no true effusion Moderate tenderness to palpation over the medial joint line ROM full in flexion and extension and lower leg rotation. Ligaments with solid consistent endpoints including ACL, PCL, LCL, MCL. Negative Mcmurray's, Apley's, and Thessalonian tests. painful patellar compression. Patellar glide with mild crepitus. Patellar and quadriceps tendons unremarkable. Hamstring and quadriceps strength is normal.  Mild worsening of symptoms from previous exam Contralateral knee unremarkable.  Procedure note After informed written and verbal consent, patient was seated on exam table. Right knee was prepped with alcohol swab and utilizing anterolateral approach, patient's right knee space was injected with 4:1  marcaine 0.5%: Kenalog /dL. Patient tolerated the procedure well without immediate complications..    Impression and Recommendations:     This case required medical decision making of moderate complexity.

## 2015-05-29 NOTE — Progress Notes (Signed)
Pre visit review using our clinic review tool, if applicable. No additional management support is needed unless otherwise documented below in the visit note. 

## 2015-05-29 NOTE — Assessment & Plan Note (Signed)
Patient was given an injection today with some mild resolution of pain. I do believe that patient has weakness of the lower extremity's which makes it somewhat difficult. Patient will have x-rays today to further evaluate the amount of arthritis in the knee. Patient will continue with the brace we discussed the possibility of needing a custom brace in the future. Patient come back again in 3-4 weeks. At that time if continuing to have difficulty may be a candidate for viscous supplementation. I do not begin to advance imaging will be warranted because patient would likely not be a candidate even if this is a intra-articular pathology. He would maybe be a candidate for knee replacement if needed. I would want to rule out and try all conservative therapy first.

## 2015-06-04 ENCOUNTER — Other Ambulatory Visit: Payer: Medicare Other

## 2015-06-09 ENCOUNTER — Encounter (HOSPITAL_COMMUNITY): Payer: Self-pay | Admitting: Family Medicine

## 2015-06-09 ENCOUNTER — Emergency Department (HOSPITAL_COMMUNITY)
Admission: EM | Admit: 2015-06-09 | Discharge: 2015-06-09 | Disposition: A | Discharge status: Home / Self Care | Payer: Medicare Other | Attending: Emergency Medicine | Admitting: Emergency Medicine

## 2015-06-09 ENCOUNTER — Emergency Department (HOSPITAL_COMMUNITY): Payer: Medicare Other

## 2015-06-09 ENCOUNTER — Telehealth: Payer: Self-pay | Admitting: Cardiology

## 2015-06-09 ENCOUNTER — Telehealth: Payer: Self-pay | Admitting: Physician Assistant

## 2015-06-09 DIAGNOSIS — I129 Hypertensive chronic kidney disease with stage 1 through stage 4 chronic kidney disease, or unspecified chronic kidney disease: Secondary | ICD-10-CM | POA: Diagnosis not present

## 2015-06-09 DIAGNOSIS — I2510 Atherosclerotic heart disease of native coronary artery without angina pectoris: Secondary | ICD-10-CM | POA: Diagnosis not present

## 2015-06-09 DIAGNOSIS — Z8673 Personal history of transient ischemic attack (TIA), and cerebral infarction without residual deficits: Secondary | ICD-10-CM | POA: Diagnosis not present

## 2015-06-09 DIAGNOSIS — E039 Hypothyroidism, unspecified: Secondary | ICD-10-CM | POA: Insufficient documentation

## 2015-06-09 DIAGNOSIS — Z7902 Long term (current) use of antithrombotics/antiplatelets: Secondary | ICD-10-CM | POA: Diagnosis not present

## 2015-06-09 DIAGNOSIS — R0600 Dyspnea, unspecified: Secondary | ICD-10-CM | POA: Insufficient documentation

## 2015-06-09 DIAGNOSIS — Z872 Personal history of diseases of the skin and subcutaneous tissue: Secondary | ICD-10-CM | POA: Diagnosis not present

## 2015-06-09 DIAGNOSIS — E785 Hyperlipidemia, unspecified: Secondary | ICD-10-CM | POA: Insufficient documentation

## 2015-06-09 DIAGNOSIS — Z79899 Other long term (current) drug therapy: Secondary | ICD-10-CM | POA: Insufficient documentation

## 2015-06-09 DIAGNOSIS — R062 Wheezing: Secondary | ICD-10-CM | POA: Insufficient documentation

## 2015-06-09 DIAGNOSIS — R531 Weakness: Secondary | ICD-10-CM | POA: Insufficient documentation

## 2015-06-09 DIAGNOSIS — I480 Paroxysmal atrial fibrillation: Secondary | ICD-10-CM | POA: Insufficient documentation

## 2015-06-09 DIAGNOSIS — R0602 Shortness of breath: Secondary | ICD-10-CM

## 2015-06-09 DIAGNOSIS — M1990 Unspecified osteoarthritis, unspecified site: Secondary | ICD-10-CM | POA: Diagnosis not present

## 2015-06-09 DIAGNOSIS — R011 Cardiac murmur, unspecified: Secondary | ICD-10-CM | POA: Insufficient documentation

## 2015-06-09 DIAGNOSIS — R0609 Other forms of dyspnea: Secondary | ICD-10-CM

## 2015-06-09 DIAGNOSIS — Z87448 Personal history of other diseases of urinary system: Secondary | ICD-10-CM | POA: Diagnosis not present

## 2015-06-09 DIAGNOSIS — E119 Type 2 diabetes mellitus without complications: Secondary | ICD-10-CM | POA: Insufficient documentation

## 2015-06-09 DIAGNOSIS — N183 Chronic kidney disease, stage 3 (moderate): Secondary | ICD-10-CM | POA: Insufficient documentation

## 2015-06-09 DIAGNOSIS — I5042 Chronic combined systolic (congestive) and diastolic (congestive) heart failure: Secondary | ICD-10-CM | POA: Insufficient documentation

## 2015-06-09 DIAGNOSIS — J449 Chronic obstructive pulmonary disease, unspecified: Secondary | ICD-10-CM | POA: Diagnosis not present

## 2015-06-09 DIAGNOSIS — Z87891 Personal history of nicotine dependence: Secondary | ICD-10-CM | POA: Insufficient documentation

## 2015-06-09 DIAGNOSIS — K219 Gastro-esophageal reflux disease without esophagitis: Secondary | ICD-10-CM | POA: Insufficient documentation

## 2015-06-09 DIAGNOSIS — Z9889 Other specified postprocedural states: Secondary | ICD-10-CM | POA: Insufficient documentation

## 2015-06-09 DIAGNOSIS — M7989 Other specified soft tissue disorders: Secondary | ICD-10-CM | POA: Diagnosis not present

## 2015-06-09 LAB — CBC
HEMATOCRIT: 28.6 % — AB (ref 39.0–52.0)
Hemoglobin: 8.6 g/dL — ABNORMAL LOW (ref 13.0–17.0)
MCH: 24.4 pg — ABNORMAL LOW (ref 26.0–34.0)
MCHC: 30.1 g/dL (ref 30.0–36.0)
MCV: 81 fL (ref 78.0–100.0)
PLATELETS: 176 10*3/uL (ref 150–400)
RBC: 3.53 MIL/uL — ABNORMAL LOW (ref 4.22–5.81)
RDW: 19.3 % — AB (ref 11.5–15.5)
WBC: 7.7 10*3/uL (ref 4.0–10.5)

## 2015-06-09 LAB — I-STAT TROPONIN, ED: Troponin i, poc: 0.06 ng/mL (ref 0.00–0.08)

## 2015-06-09 LAB — BASIC METABOLIC PANEL
Anion gap: 9 (ref 5–15)
BUN: 17 mg/dL (ref 6–20)
CO2: 23 mmol/L (ref 22–32)
CREATININE: 1.85 mg/dL — AB (ref 0.61–1.24)
Calcium: 8.7 mg/dL — ABNORMAL LOW (ref 8.9–10.3)
Chloride: 116 mmol/L — ABNORMAL HIGH (ref 101–111)
GFR calc Af Amer: 39 mL/min — ABNORMAL LOW (ref >60)
GFR calc non Af Amer: 34 mL/min — ABNORMAL LOW (ref >60)
GLUCOSE: 118 mg/dL — AB (ref 65–99)
Potassium: 3.5 mmol/L (ref 3.5–5.1)
SODIUM: 148 mmol/L — AB (ref 135–145)

## 2015-06-09 LAB — PROTIME-INR
INR: 1.76 — AB (ref 0.00–1.49)
Prothrombin Time: 20.5 seconds — ABNORMAL HIGH (ref 11.6–15.2)

## 2015-06-09 LAB — BRAIN NATRIURETIC PEPTIDE: B NATRIURETIC PEPTIDE 5: 384.2 pg/mL — AB (ref 0.0–100.0)

## 2015-06-09 MED ORDER — FUROSEMIDE 10 MG/ML IJ SOLN
40.0000 mg | Freq: Once | INTRAMUSCULAR | Status: AC
  Administered 2015-06-09: 40 mg via INTRAVENOUS
  Filled 2015-06-09: qty 4

## 2015-06-09 MED ORDER — ALBUTEROL SULFATE (2.5 MG/3ML) 0.083% IN NEBU
5.0000 mg | INHALATION_SOLUTION | Freq: Once | RESPIRATORY_TRACT | Status: AC
  Administered 2015-06-09: 5 mg via RESPIRATORY_TRACT
  Filled 2015-06-09: qty 6

## 2015-06-09 MED ORDER — ASPIRIN 325 MG PO TABS
325.0000 mg | ORAL_TABLET | ORAL | Status: DC
  Filled 2015-06-09: qty 1

## 2015-06-09 MED ORDER — IPRATROPIUM BROMIDE 0.02 % IN SOLN
0.5000 mg | Freq: Once | RESPIRATORY_TRACT | Status: AC
  Administered 2015-06-09: 0.5 mg via RESPIRATORY_TRACT
  Filled 2015-06-09: qty 2.5

## 2015-06-09 NOTE — ED Notes (Signed)
Pt ambulated in hall with assistance of NT.  O2 saturations remained above 93%.  Denied any weakness, dizziness or SOB.

## 2015-06-09 NOTE — Telephone Encounter (Signed)
New Message  Pt started back on O2 on Sunday and swelling in both legs. Pt wife wanted to sepak w/ RN about current condition. Please call back and discuss.   Pt c/o swelling: STAT is pt has developed SOB within 24 hours  1. How long have you been experiencing swelling? 1 week   2. Where is the swelling located? Both legs   3.  Are you currently taking a "fluid pill"? Torsimide? (1/day, but recently taking 2/day to help w/ swelling)  4.  Are you currently SOB? Yes   5.  Have you traveled recently? no

## 2015-06-09 NOTE — Telephone Encounter (Signed)
Pt. Called no answer LMTCB 

## 2015-06-09 NOTE — ED Provider Notes (Signed)
CSN: [id number]     Arrival date & time 06/09/15  1408 History   First MD Initiated Contact with Patient 06/09/15 1629     Chief Complaint  Patient presents with  . Leg Swelling  . Weakness  . Shortness of Breath     (Consider location/radiation/quality/duration/timing/severity/associated sxs/prior Treatment) HPI Frank Mclean is a 74 year old male with past medical history of hypertension, COPD, CKD, CAD, CHF with last echo on 02/2015 with EF of 30-35% with recent pacemaker/AICD placement who presents the ER complaining of leg swelling bilaterally, shortness of breath. Patient states over the past 3 days he has felt generalized weakness, dyspnea on exertion. Patient states she's been compliant with medications, however is noted worsening of lower extremity edema, and dyspnea on exertion which is worse than usual. Patient reports orthopnea as well. Patient denies any chest pain, palpitations, nausea, vomiting, abdominal pain, diarrhea, headache, blurred vision, dizziness.  Past Medical History  Diagnosis Date  . Gout   . HTN (hypertension)   . Hyperlipidemia   . Osteoarthritis   . History of CVA (cerebrovascular accident)     Left pontine infarct July 2004; changed from Plavix to Coumadin in 2004 per MD at [facility]  . GERD (gastroesophageal reflux disease)   . DJD (degenerative joint disease)   . BPH (benign prostatic hypertrophy)   . Peripheral vascular disease   . Bilateral leg ulcer     ACHILLES AREA--  NONHEALING  . CKD (chronic kidney disease) stage 3, GFR 30-59 ml/min   . CAD (coronary artery disease)     a. LHC 4/12: Mid LAD 25%, mid diagonal 30%, AV circumflex 40%, proximal OM 25%, distal RCA 40%  . Chronic combined systolic and diastolic heart failure     a. Echo 05/2013: EF 25-30%, diffuse HK, restrictive physiology, trivial AI, mild MR, moderate LAE, reduced RV systolic function, PASP 42  . LBBB (left bundle branch block)   . PAF (paroxysmal atrial fibrillation)     a.  amiodarone Rx started 05/2013;  b. chronic coumadin  . NICM (nonischemic cardiomyopathy)     a. EF 25-30%.  [name] Hx of cardiovascular stress test     Nuclear Stress Test (1/16): High risk stress nuclear study with a large, severe, partially reversible inferior and apical defect consistent with prior inferior and apical infarct; mild apical ischemia; severe LVE; study high risk due to reduced LV function.  EF 25% >> reviewed with Dr. Antoine Poche >> medical mgmt  . Hypothyroidism   . DM (diabetes mellitus), type 2    Past Surgical History  Procedure Laterality Date  . Cardiac catheterization  04-16-2011   DR [facility]    NON-OBSTRUCTIVE CAD. MILDLY ELEVATED PULMONARY PRESSURES/ ELEVATED  END-DIASTOLIC PRESSURE  . Transthoracic echocardiogram  12-31-2010    MODERATE CONCENTRIC LVH/ SYSTOLIC FUNCTION SEVERELY REDUCED/ EF 25-30%/  SEVERE HYPOKINESIS OF ANTEROSEPTAL MYOCARDIUM  AND ENTIREAPICAL MYOCARDIUM /  MODERATE HYPOKINESIS OF LATERAL, INFEROLATERAL, INFERIOR,AND INFEROSEPTAL MYOCARIUM/  GRADE 3 DIASTOLIC DYSFUNCTION/ MILD MR  . Aortogram w/ bilateral lower extremitiy runoff  05-18-2013  DR FIELDS    LEFT LEG OCCLUDED PERONEAL AND ANTERIOR TIBIAL ARTERIES/ HIGH GRADE STENOIS 80% MIDDLE AND DISTAL THIRD OF POSTERIOR TIBIAL ARTERY/ RIGHT PERONEAL AND ANTERIOR TIBIAL ARTERY OCCLUDED/ 40% STENOSIS DISTALLY   . Abdominal aortagram N/A 05/18/2013    Procedure: ABDOMINAL Ronny Flurry;  Surgeon: Sherren Kerns, MD;  Location: [facility] CATH LAB;  Service: Cardiovascular;  Laterality: N/A;  . Lower extremity angiogram Bilateral 05/18/2013    Procedure: LOWER EXTREMITY ANGIOGRAM;  Surgeon: Sherren Kerns, MD;  Location: [facility] CATH LAB;  Service: Cardiovascular;  Laterality: Bilateral;  . Right heart catheterization N/A 02/13/2015    Procedure: RIGHT HEART CATH;  Surgeon: Laurey Morale, MD;  Location: [facility] CATH LAB;  Service: Cardiovascular;  Laterality: N/A;  . Bi-ventricular implantable cardioverter defibrillator N/A  03/12/2015    Procedure: BI-VENTRICULAR IMPLANTABLE CARDIOVERTER DEFIBRILLATOR  (CRT-D);  Surgeon: Marinus Maw, MD;  Location: [facility] CATH LAB;  Service: Cardiovascular;  Laterality: N/A;   Family History  Problem Relation Age of Onset  . Hypertension Mother   . Heart disease Mother   . Diabetes Mother   . Gout Other   . Stroke Other   . Hypertension Father   . Heart disease Father   . Heart attack Maternal Grandmother   . Thyroid disease Neg Hx    History  Substance Use Topics  . Smoking status: Former Smoker    Quit date: 04/12/1974  . Smokeless tobacco: Never Used  . Alcohol Use: No     Comment: previously drank - "love cognac" quit 15 yrs ago.    Review of Systems  Constitutional: Negative for fever.  HENT: Negative for trouble swallowing.   Eyes: Negative for visual disturbance.  Respiratory: Positive for shortness of breath.   Cardiovascular: Negative for chest pain.  Gastrointestinal: Negative for nausea, vomiting and abdominal pain.  Genitourinary: Negative for dysuria.  Musculoskeletal: Negative for neck pain.  Skin: Negative for rash.  Neurological: Negative for dizziness, weakness and numbness.  Psychiatric/Behavioral: Negative.     Allergies  Levothyroxine sodium; Chicken protein; and Eggs or egg-derived products  Home Medications   Prior to Admission medications   Medication Sig Start Date End Date Taking? Authorizing Provider  acetaminophen (TYLENOL) 500 MG tablet Take 500 mg by mouth every 6 (six) hours as needed.    Historical Provider, MD  amLODipine (NORVASC) 10 MG tablet Take 1 tablet (10 mg total) by mouth daily. 04/01/15   Mcarthur Rossetti Angiulli, PA-C  atorvastatin (LIPITOR) 20 MG tablet Take 1 tablet (20 mg total) by mouth daily. 04/01/15   Mcarthur Rossetti Angiulli, PA-C  carvedilol (COREG) 12.5 MG tablet Take 1 tablet (12.5 mg total) by mouth 2 (two) times daily with a meal. 04/01/15   Mcarthur Rossetti Angiulli, PA-C  cholecalciferol (VITAMIN D) 1000 UNITS tablet Take 1  tablet (1,000 Units total) by mouth daily. 04/01/15   Mcarthur Rossetti Angiulli, PA-C  [employer] VITAMIN B12 1000 MCG tablet TAKE 1 TABLET BY MOUTH DAILY 01/27/15   Aleksei Plotnikov V, MD  escitalopram (LEXAPRO) 5 MG tablet Take 1 tablet (5 mg total) by mouth daily. 04/01/15   Mcarthur Rossetti Angiulli, PA-C  ferrous sulfate 325 (65 FE) MG tablet Take 1 tablet (325 mg total) by mouth daily. 04/01/15   Daniel J Angiulli, PA-C  glipiZIDE (GLUCOTROL) 5 MG tablet TAKE 1/2 TABLET BY MOUTH TWICE A DAY BEFORE A MEAL 01/27/15   Aleksei Plotnikov V, MD  guaiFENesin (MUCINEX) 600 MG 12 hr tablet Take 1 tablet (600 mg total) by mouth 2 (two) times daily. 04/01/15   Mcarthur Rossetti Angiulli, PA-C  hydrALAZINE (APRESOLINE) 100 MG tablet Take 1 tablet (100 mg total) by mouth 3 (three) times daily. 04/01/15   Mcarthur Rossetti Angiulli, PA-C  HYDROcodone-acetaminophen (NORCO/VICODIN) 5-325 MG per tablet Take 1 tablet by mouth every 6 (six) hours as needed for moderate pain. 04/01/15   Mcarthur Rossetti Angiulli, PA-C  isosorbide mononitrate (IMDUR) 60 MG 24 hr tablet Take 1.5 tablets for 90  mg by mouth twice a day. 04/21/15   Aleksei Plotnikov V, MD  Multiple Vitamin (MULTIVITAMIN WITH MINERALS) TABS tablet Take 1 tablet by mouth daily. 04/01/15   Mcarthur Rossetti Angiulli, PA-C  OXYGEN Inhale 2.5 L into the lungs See admin instructions. USES OXYGEN EVERY BEDTIME USES DURING DAY ONLY AS NEEDED    Historical Provider, MD  pantoprazole (PROTONIX) 40 MG tablet Take 1 tablet (40 mg total) by mouth 2 (two) times daily. 04/01/15   Mcarthur Rossetti Angiulli, PA-C  polyethylene glycol (MIRALAX / GLYCOLAX) packet Take 17 g by mouth daily. 04/01/15   Mcarthur Rossetti Angiulli, PA-C  potassium chloride SA (K-DUR,KLOR-CON) 20 MEQ tablet Take 1 tablet (20 mEq total) by mouth daily. 04/01/15   Mcarthur Rossetti Angiulli, PA-C  SYMBICORT 160-4.5 MCG/ACT inhaler INHALE 2 PUFFS INTO THE LUNGS 2 TIMES DAILY 05/07/15   Aleksei Plotnikov V, MD  SYNTHROID 100 MCG tablet TAKE 2 TABLETS BY MOUTH EVERY MORNING BEFORE BREAKFAST  01/27/15   Aleksei Plotnikov V, MD  torsemide (DEMADEX) 10 MG tablet Take 1 tablet (10 mg total) by mouth daily. 04/01/15   Mcarthur Rossetti Angiulli, PA-C  warfarin (COUMADIN) 5 MG tablet 5 mg tablet daily except one half tablet on Mondays and Fridays 04/01/15   Mcarthur Rossetti Angiulli, PA-C   BP 144/84 mmHg  Pulse 57  Temp(Src) 98.1 F (36.7 C) (Oral)  Resp 18  Wt 216 lb 6.4 oz (98.158 kg)  SpO2 94% Physical Exam  Constitutional: He is oriented to person, place, and time. He appears well-developed and well-nourished. No distress.  HENT:  Head: Normocephalic and atraumatic.  Mouth/Throat: Oropharynx is clear and moist. No oropharyngeal exudate.  Eyes: Right eye exhibits no discharge. Left eye exhibits no discharge. No scleral icterus.  Neck: Normal range of motion.  Cardiovascular: Normal rate and regular rhythm.   Murmur heard.  Systolic murmur is present with a grade of 2/6  Murmur best heard at RUSB.   Pulmonary/Chest: Effort normal. No accessory muscle usage. No tachypnea. No respiratory distress. He has wheezes in the right upper field, the right middle field, the left upper field and the left middle field. He has rhonchi in the right lower field and the left lower field.  Abdominal: Soft. There is no tenderness.  Musculoskeletal: Normal range of motion. He exhibits no edema or tenderness.  Lymphadenopathy:  2+ pitting edema noted bilaterally.   Neurological: He is alert and oriented to person, place, and time. No cranial nerve deficit. Coordination normal.  Skin: Skin is warm and dry. No rash noted. He is not diaphoretic.  Psychiatric: He has a normal mood and affect.  Nursing note and vitals reviewed.   ED Course  Procedures (including critical care time) Labs Review Labs Reviewed  CBC - Abnormal; Notable for the following:    RBC 3.53 (*)    Hemoglobin 8.6 (*)    HCT 28.6 (*)    MCH 24.4 (*)    RDW 19.3 (*)    All other components within normal limits  BASIC METABOLIC PANEL -  Abnormal; Notable for the following:    Sodium 148 (*)    Chloride 116 (*)    Glucose, Bld 118 (*)    Creatinine, Ser 1.85 (*)    Calcium 8.7 (*)    GFR calc non Af Amer 34 (*)    GFR calc Af Amer 39 (*)    All other components within normal limits  BRAIN NATRIURETIC PEPTIDE - Abnormal; Notable for the following:  B Natriuretic Peptide 384.2 (*)    All other components within normal limits  PROTIME-INR - Abnormal; Notable for the following:    Prothrombin Time 20.5 (*)    INR 1.76 (*)    All other components within normal limits  I-STAT TROPOININ, ED    Imaging Review Dg Chest 2 View  06/09/2015   CLINICAL DATA:  Increased weakness and shortness of Breath  EXAM: CHEST - 2 VIEW  COMPARISON:  03/24/2015  FINDINGS: Cardiomegaly is again noted. A defibrillator is again seen and stable. The previously seen vascular congestion and interstitial changes have nearly completely resolved in the interval. Mild blunting of the left costophrenic angle is noted consistent with a small effusion. Hyperinflation is noted.  IMPRESSION: Small left pleural effusion and COPD.   Electronically Signed   By: Alcide Clever M.D.   On: 06/09/2015 15:37     EKG Interpretation   Date/Time:  Monday June 09 2015 14:41:57 EDT Ventricular Rate:  53 PR Interval:  146 QRS Duration: 170 QT Interval:  538 QTC Calculation: 504 R Axis:   -76 Text Interpretation:  Sinus bradycardia Left axis deviation Left bundle  branch block Abnormal ECG similar previous Confirmed by ZAVITZ  MD, JOSHUA  ([id number]) on 06/09/2015 2:49:45 PM      MDM   Final diagnoses:  DOE (dyspnea on exertion)    Patient here with dyspnea on exertion, some symptoms consistent with fluid overload versus COPD. Wheezes and rhonchi noted on lung exam, however patient does note to have bilateral pedal edema. Based on patient's history of CHF, patient's exam with wheezes and rhonchi, we'll treat patient with IV Lasix here for mild diuresis, as well as  breathing treatment.  After DuoNeb treatment, patient stating he is feeling that he is back to his baseline. Patient denies any shortness of breath, lung exam has improved remarkably. Throughout stay patient is afebrile, hemodynamically stable and in no acute distress. Troponin negative after 3 days of symptoms, with troponin 0 greater than 6 hours after onset of symptoms, normal/stable-appearing EKG for patient, there is no concern for ACS or anginal equivalent. Patient's labs otherwise unremarkable for acute pathology, appeared to be at baseline for patient. Patient stating he is "feeling much better and ready to go home". Patient ambulates in a hallway, maintaining oxygen saturation above 93%. Patient appearing and stating that he feels back at baseline for him. Encouraged patient to increase torsemide dose from 10 mg to 15 mg daily for the next 3 days, then continue torsemide at 10 mg per day. Strongly encouraged patient to follow-up with cardiology, PCP. Return precautions discussed, patient verbalizes understanding and agreement of this plan.  BP 144/84 mmHg  Pulse 57  Temp(Src) 98.1 F (36.7 C) (Oral)  Resp 18  Wt 216 lb 6.4 oz (98.158 kg)  SpO2 94%  Signed,  Ladona Mow, PA-C 2:05 AM  Patient seen and discussed with Dr. Pricilla Loveless, MD    Ladona Mow, PA-C 06/10/15 0205  Pricilla Loveless, MD 06/13/15 [phone]

## 2015-06-09 NOTE — ED Notes (Signed)
Frank Mclean (wife)   [phone]

## 2015-06-09 NOTE — ED Notes (Signed)
Pt here for increased weakness, swelling in BLE, SOB. sts recent pacemaker insertion.

## 2015-06-09 NOTE — Discharge Instructions (Signed)
Take 1.5 pills (15mg  total) of Toresemide for the next 3 days.  After 3 days, continue to take 1 pill (10mg  total).  Follow up with your Cardiologist this week.  Return tot he ER with any worsening of symptoms.    Heart Failure Heart failure is a condition in which the heart has trouble pumping blood. This means your heart does not pump blood efficiently for your body to work well. In some cases of heart failure, fluid may back up into your lungs or you may have swelling (edema) in your lower legs. Heart failure is usually a long-term (chronic) condition. It is important for you to take good care of yourself and follow your health care provider's treatment plan. CAUSES  Some health conditions can cause heart failure. Those health conditions include:  High blood pressure (hypertension). Hypertension causes the heart muscle to work harder than normal. When pressure in the blood vessels is high, the heart needs to pump (contract) with more force in order to circulate blood throughout the body. High blood pressure eventually causes the heart to become stiff and weak.  Coronary artery disease (CAD). CAD is the buildup of cholesterol and fat (plaque) in the arteries of the heart. The blockage in the arteries deprives the heart muscle of oxygen and blood. This can cause chest pain and may lead to a heart attack. High blood pressure can also contribute to CAD.  Heart attack (myocardial infarction). A heart attack occurs when one or more arteries in the heart become blocked. The loss of oxygen damages the muscle tissue of the heart. When this happens, part of the heart muscle dies. The injured tissue does not contract as well and weakens the heart's ability to pump blood.  Abnormal heart valves. When the heart valves do not open and close properly, it can cause heart failure. This makes the heart muscle pump harder to keep the blood flowing.  Heart muscle disease (cardiomyopathy or myocarditis). Heart muscle  disease is damage to the heart muscle from a variety of causes. These can include drug or alcohol abuse, infections, or unknown reasons. These can increase the risk of heart failure.  Lung disease. Lung disease makes the heart work harder because the lungs do not work properly. This can cause a strain on the heart, leading it to fail.  Diabetes. Diabetes increases the risk of heart failure. High blood sugar contributes to high fat (lipid) levels in the blood. Diabetes can also cause slow damage to tiny blood vessels that carry important nutrients to the heart muscle. When the heart does not get enough oxygen and food, it can cause the heart to become weak and stiff. This leads to a heart that does not contract efficiently.  Other conditions can contribute to heart failure. These include abnormal heart rhythms, thyroid problems, and low blood counts (anemia). Certain unhealthy behaviors can increase the risk of heart failure, including:  Being overweight.  Smoking or chewing tobacco.  Eating foods high in fat and cholesterol.  Abusing illicit drugs or alcohol.  Lacking physical activity. SYMPTOMS  Heart failure symptoms may vary and can be hard to detect. Symptoms may include:  Shortness of breath with activity, such as climbing stairs.  Persistent cough.  Swelling of the feet, ankles, legs, or abdomen.  Unexplained weight gain.  Difficulty breathing when lying flat (orthopnea).  Waking from sleep because of the need to sit up and get more air.  Rapid heartbeat.  Fatigue and loss of energy.  Feeling light-headed,  dizzy, or close to fainting.  Loss of appetite.  Nausea.  Increased urination during the night (nocturia). DIAGNOSIS  A diagnosis of heart failure is based on your history, symptoms, physical examination, and diagnostic tests. Diagnostic tests for heart failure may include:  Echocardiography.  Electrocardiography.  Chest X-ray.  Blood tests.  Exercise  stress test.  Cardiac angiography.  Radionuclide scans. TREATMENT  Treatment is aimed at managing the symptoms of heart failure. Medicines, behavioral changes, or surgical intervention may be necessary to treat heart failure.  Medicines to help treat heart failure may include:  Angiotensin-converting enzyme (ACE) inhibitors. This type of medicine blocks the effects of a blood protein called angiotensin-converting enzyme. ACE inhibitors relax (dilate) the blood vessels and help lower blood pressure.  Angiotensin receptor blockers (ARBs). This type of medicine blocks the actions of a blood protein called angiotensin. Angiotensin receptor blockers dilate the blood vessels and help lower blood pressure.  Water pills (diuretics). Diuretics cause the kidneys to remove salt and water from the blood. The extra fluid is removed through urination. This loss of extra fluid lowers the volume of blood the heart pumps.  Beta blockers. These prevent the heart from beating too fast and improve heart muscle strength.  Digitalis. This increases the force of the heartbeat.  Healthy behavior changes include:  Obtaining and maintaining a healthy weight.  Stopping smoking or chewing tobacco.  Eating heart-healthy foods.  Limiting or avoiding alcohol.  Stopping illicit drug use.  Physical activity as directed by your health care provider.  Surgical treatment for heart failure may include:  A procedure to open blocked arteries, repair damaged heart valves, or remove damaged heart muscle tissue.  A pacemaker to improve heart muscle function and control certain abnormal heart rhythms.  An internal cardioverter defibrillator to treat certain serious abnormal heart rhythms.  A left ventricular assist device (LVAD) to assist the pumping ability of the heart. HOME CARE INSTRUCTIONS   Take medicines only as directed by your health care provider. Medicines are important in reducing the workload of your  heart, slowing the progression of heart failure, and improving your symptoms.  Do not stop taking your medicine unless directed by your health care provider.  Do not skip any dose of medicine.  Refill your prescriptions before you run out of medicine. Your medicines are needed every day.  Engage in moderate physical activity if directed by your health care provider. Moderate physical activity can benefit some people. The elderly and people with severe heart failure should consult with a health care provider for physical activity recommendations.  Eat heart-healthy foods. Food choices should be free of trans fat and low in saturated fat, cholesterol, and salt (sodium). Healthy choices include fresh or frozen fruits and vegetables, fish, lean meats, legumes, fat-free or low-fat dairy products, and whole grain or high fiber foods. Talk to a dietitian to learn more about heart-healthy foods.  Limit sodium if directed by your health care provider. Sodium restriction may reduce symptoms of heart failure in some people. Talk to a dietitian to learn more about heart-healthy seasonings.  Use healthy cooking methods. Healthy cooking methods include roasting, grilling, broiling, baking, poaching, steaming, or stir-frying. Talk to a dietitian to learn more about healthy cooking methods.  Limit fluids if directed by your health care provider. Fluid restriction may reduce symptoms of heart failure in some people.  Weigh yourself every day. Daily weights are important in the early recognition of excess fluid. You should weigh yourself every morning after  you urinate and before you eat breakfast. Wear the same amount of clothing each time you weigh yourself. Record your daily weight. Provide your health care provider with your weight record.  Monitor and record your blood pressure if directed by your health care provider.  Check your pulse if directed by your health care provider.  Lose weight if directed by  your health care provider. Weight loss may reduce symptoms of heart failure in some people.  Stop smoking or chewing tobacco. Nicotine makes your heart work harder by causing your blood vessels to constrict. Do not use nicotine gum or patches before talking to your health care provider.  Keep all follow-up visits as directed by your health care provider. This is important.  Limit alcohol intake to no more than 1 drink per day for nonpregnant women and 2 drinks per day for men. One drink equals 12 ounces of beer, 5 ounces of wine, or 1 ounces of hard liquor. Drinking more than that is harmful to your heart. Tell your health care provider if you drink alcohol several times a week. Talk with your health care provider about whether alcohol is safe for you. If your heart has already been damaged by alcohol or you have severe heart failure, drinking alcohol should be stopped completely.  Stop illicit drug use.  Stay up-to-date with immunizations. It is especially important to prevent respiratory infections through current pneumococcal and influenza immunizations.  Manage other health conditions such as hypertension, diabetes, thyroid disease, or abnormal heart rhythms as directed by your health care provider.  Learn to manage stress.  Plan rest periods when fatigued.  Learn strategies to manage high temperatures. If the weather is extremely hot:  Avoid vigorous physical activity.  Use air conditioning or fans or seek a cooler location.  Avoid caffeine and alcohol.  Wear loose-fitting, lightweight, and light-colored clothing.  Learn strategies to manage cold temperatures. If the weather is extremely cold:  Avoid vigorous physical activity.  Layer clothes.  Wear mittens or gloves, a hat, and a scarf when going outside.  Avoid alcohol.  Obtain ongoing education and support as needed.  Participate in or seek rehabilitation as needed to maintain or improve independence and quality of  life. SEEK MEDICAL CARE IF:   Your weight increases by 03 lb/1.4 kg in 1 day or 05 lb/2.3 kg in a week.  You have increasing shortness of breath that is unusual for you.  You are unable to participate in your usual physical activities.  You tire easily.  You cough more than normal, especially with physical activity.  You have any or more swelling in areas such as your hands, feet, ankles, or abdomen.  You are unable to sleep because it is hard to breathe.  You feel like your heart is beating fast (palpitations).  You become dizzy or light-headed upon standing up. SEEK IMMEDIATE MEDICAL CARE IF:   You have difficulty breathing.  There is a change in mental status such as decreased alertness or difficulty with concentration.  You have a pain or discomfort in your chest.  You have an episode of fainting (syncope). MAKE SURE YOU:   Understand these instructions.  Will watch your condition.  Will get help right away if you are not doing well or get worse. Document Released: 12/06/2005 Document Revised: 04/22/2014 Document Reviewed: 01/05/2013 [facility] Patient Information 2015 [place], [place]. This information is not intended to replace advice given to you by your health care provider. Make sure you discuss any questions  you have with your health care provider.  Edema Edema is an abnormal buildup of fluids in your bodytissues. Edema is somewhatdependent on gravity to pull the fluid to the lowest place in your body. That makes the condition more common in the legs and thighs (lower extremities). Painless swelling of the feet and ankles is common and becomes more likely as you get older. It is also common in looser tissues, like around your eyes.  When the affected area is squeezed, the fluid may move out of that spot and leave a dent for a few moments. This dent is called pitting.  CAUSES  There are many possible causes of edema. Eating too much salt and being on your feet or  sitting for a long time can cause edema in your legs and ankles. Hot weather may make edema worse. Common medical causes of edema include:  Heart failure.  Liver disease.  Kidney disease.  Weak blood vessels in your legs.  Cancer.  An injury.  Pregnancy.  Some medications.  Obesity. SYMPTOMS  Edema is usually painless.Your skin may look swollen or shiny.  DIAGNOSIS  Your health care provider may be able to diagnose edema by asking about your medical history and doing a physical exam. You may need to have tests such as X-rays, an electrocardiogram, or blood tests to check for medical conditions that may cause edema.  TREATMENT  Edema treatment depends on the cause. If you have heart, liver, or kidney disease, you need the treatment appropriate for these conditions. General treatment may include:  Elevation of the affected body part above the level of your heart.  Compression of the affected body part. Pressure from elastic bandages or support stockings squeezes the tissues and forces fluid back into the blood vessels. This keeps fluid from entering the tissues.  Restriction of fluid and salt intake.  Use of a water pill (diuretic). These medications are appropriate only for some types of edema. They pull fluid out of your body and make you urinate more often. This gets rid of fluid and reduces swelling, but diuretics can have side effects. Only use diuretics as directed by your health care provider. HOME CARE INSTRUCTIONS   Keep the affected body part above the level of your heart when you are lying down.   Do not sit still or stand for prolonged periods.   Do not put anything directly under your knees when lying down.  Do not wear constricting clothing or garters on your upper legs.   Exercise your legs to work the fluid back into your blood vessels. This may help the swelling go down.   Wear elastic bandages or support stockings to reduce ankle swelling as directed  by your health care provider.   Eat a low-salt diet to reduce fluid if your health care provider recommends it.   Only take medicines as directed by your health care provider. SEEK MEDICAL CARE IF:   Your edema is not responding to treatment.  You have heart, liver, or kidney disease and notice symptoms of edema.  You have edema in your legs that does not improve after elevating them.   You have sudden and unexplained weight gain. SEEK IMMEDIATE MEDICAL CARE IF:   You develop shortness of breath or chest pain.   You cannot breathe when you lie down.  You develop pain, redness, or warmth in the swollen areas.   You have heart, liver, or kidney disease and suddenly get edema.  You have a fever  and your symptoms suddenly get worse. MAKE SURE YOU:   Understand these instructions.  Will watch your condition.  Will get help right away if you are not doing well or get worse. Document Released: 12/06/2005 Document Revised: 04/22/2014 Document Reviewed: 09/28/2013 [facility] Patient Information 2015 [place], [place]. This information is not intended to replace advice given to you by your health care provider. Make sure you discuss any questions you have with your health care provider.

## 2015-06-10 NOTE — Telephone Encounter (Signed)
Frank Mclean at 06/09/2015 8:51 AM     Status: Signed       Expand All Collapse All   New Message  Pt started back on O2 on Sunday and swelling in both legs. Pt wife wanted to [place]ak w/ RN about current condition. Please call back and discuss.   Pt c/o swelling: STAT is pt has developed SOB within 24 hours  1. How long have you been experiencing swelling? 1 week   2. Where is the swelling located? Both legs   3. Are you currently taking a "fluid pill"? Torsimide? (1/day, but recently taking 2/day to help w/ swelling)  4. Are you currently SOB? Yes   5. Have you traveled recently? no       Above is documentation of call received yesterday. Patient went to ED yesterday per EPIC notes.

## 2015-06-12 ENCOUNTER — Telehealth: Payer: Self-pay | Admitting: Physician Assistant

## 2015-06-12 NOTE — Telephone Encounter (Signed)
Please put Mr. [name] on [employer]. He may need to be referred back to the [facility].  That can be determined at his office visit. Tereso Newcomer, PA-C   06/12/2015 4:37 PM

## 2015-06-12 NOTE — Telephone Encounter (Signed)
New message      Pt c/o swelling: STAT is pt has developed SOB within 24 hours  1. How long have you been experiencing swelling? 6-7 days----getting worse 2. Where is the swelling located? Feet/ankles 3.  Are you currently taking a "fluid pill"? Pt got lasix at ER on Monday.  Did not get presc  4.  Are you currently SOB? No---pt slept with oxygen last night  5.  Have you traveled recently?

## 2015-06-12 NOTE — Telephone Encounter (Signed)
There are no open appointments on [facility] schedule tomorrow. Will send to Dr. Antoine Poche and see if he can see him any time soon.

## 2015-06-12 NOTE — Telephone Encounter (Signed)
I do not have clinic until Thursday of next week.  He needs to be worked into the next available [facility] appt before that or go to the ED.

## 2015-06-12 NOTE — Telephone Encounter (Signed)
Patient's spouse calling about patient's swelling in legs. Patient went to ED on 06/09/15 for SOB and BLE edema. SOB was better with breathing treatment at hospital and using his inhaler. Patient's BLE hascontinue to stay swollen and patient is taking the extra dose of torsemide at 15 mg for the last three days, that ED instructed. This has not helped. Patient's spouse is not keeping up with patient's weight, but stated he hasn't had any weight gain. Patient has a follow-up appointment on 07/02/2015 with Tereso Newcomer PA. Will forward to [facility] for further instructions.

## 2015-06-13 ENCOUNTER — Other Ambulatory Visit: Payer: Self-pay | Admitting: Cardiology

## 2015-06-13 ENCOUNTER — Ambulatory Visit (INDEPENDENT_AMBULATORY_CARE_PROVIDER_SITE_OTHER): Payer: Medicare Other | Admitting: *Deleted

## 2015-06-13 ENCOUNTER — Encounter: Payer: Self-pay | Admitting: Internal Medicine

## 2015-06-13 ENCOUNTER — Ambulatory Visit (INDEPENDENT_AMBULATORY_CARE_PROVIDER_SITE_OTHER): Payer: Medicare Other | Admitting: Physician Assistant

## 2015-06-13 ENCOUNTER — Encounter: Payer: Self-pay | Admitting: Physician Assistant

## 2015-06-13 VITALS — BP 140/70 | HR 62 | Ht 74.5 in | Wt 216.8 lb

## 2015-06-13 DIAGNOSIS — N183 Chronic kidney disease, stage 3 unspecified: Secondary | ICD-10-CM

## 2015-06-13 DIAGNOSIS — Z95810 Presence of automatic (implantable) cardiac defibrillator: Secondary | ICD-10-CM

## 2015-06-13 DIAGNOSIS — I639 Cerebral infarction, unspecified: Secondary | ICD-10-CM | POA: Diagnosis not present

## 2015-06-13 DIAGNOSIS — I429 Cardiomyopathy, unspecified: Secondary | ICD-10-CM

## 2015-06-13 DIAGNOSIS — R0600 Dyspnea, unspecified: Secondary | ICD-10-CM | POA: Diagnosis not present

## 2015-06-13 DIAGNOSIS — D649 Anemia, unspecified: Secondary | ICD-10-CM

## 2015-06-13 DIAGNOSIS — I5043 Acute on chronic combined systolic (congestive) and diastolic (congestive) heart failure: Secondary | ICD-10-CM

## 2015-06-13 DIAGNOSIS — I428 Other cardiomyopathies: Secondary | ICD-10-CM

## 2015-06-13 DIAGNOSIS — I5022 Chronic systolic (congestive) heart failure: Secondary | ICD-10-CM

## 2015-06-13 DIAGNOSIS — I10 Essential (primary) hypertension: Secondary | ICD-10-CM | POA: Diagnosis not present

## 2015-06-13 DIAGNOSIS — E870 Hyperosmolality and hypernatremia: Secondary | ICD-10-CM

## 2015-06-13 LAB — CUP PACEART INCLINIC DEVICE CHECK
Battery Voltage: 3.1 V
Brady Statistic AP VS Percent: 1.17 %
Brady Statistic AS VS Percent: 66.13 %
Brady Statistic RV Percent Paced: 31.13 %
Date Time Interrogation Session: 20160624162158
HighPow Impedance: 133 Ohm
HighPow Impedance: 59 Ohm
Lead Channel Impedance Value: 399 Ohm
Lead Channel Impedance Value: 4047 Ohm
Lead Channel Impedance Value: 4047 Ohm
Lead Channel Impedance Value: 4047 Ohm
Lead Channel Pacing Threshold Amplitude: 0.625 V
Lead Channel Pacing Threshold Amplitude: 0.75 V
Lead Channel Pacing Threshold Pulse Width: 0.4 ms
Lead Channel Pacing Threshold Pulse Width: 0.4 ms
Lead Channel Sensing Intrinsic Amplitude: 3.25 mV
Lead Channel Sensing Intrinsic Amplitude: 4.125 mV
Lead Channel Setting Pacing Amplitude: 2 V
Lead Channel Setting Pacing Pulse Width: 0.4 ms
MDC IDC MSMT BATTERY REMAINING LONGEVITY: 125 mo
MDC IDC MSMT LEADCHNL RV IMPEDANCE VALUE: 361 Ohm
MDC IDC MSMT LEADCHNL RV SENSING INTR AMPL: 12.875 mV
MDC IDC MSMT LEADCHNL RV SENSING INTR AMPL: 14 mV
MDC IDC SET LEADCHNL RV PACING AMPLITUDE: 2.5 V
MDC IDC SET LEADCHNL RV SENSING SENSITIVITY: 0.3 mV
MDC IDC SET ZONE DETECTION INTERVAL: 350 ms
MDC IDC SET ZONE DETECTION INTERVAL: 450 ms
MDC IDC STAT BRADY AP VP PERCENT: 5 %
MDC IDC STAT BRADY AS VP PERCENT: 27.71 %
MDC IDC STAT BRADY RA PERCENT PACED: 6.17 %
Zone Setting Detection Interval: 300 ms
Zone Setting Detection Interval: 360 ms

## 2015-06-13 LAB — CBC
HCT: 30.2 % — ABNORMAL LOW (ref 39.0–52.0)
Hemoglobin: 9.3 g/dL — ABNORMAL LOW (ref 13.0–17.0)
MCH: 23.6 pg — ABNORMAL LOW (ref 26.0–34.0)
MCHC: 30.8 g/dL (ref 30.0–36.0)
MCV: 76.6 fL — ABNORMAL LOW (ref 78.0–100.0)
MPV: 10.8 fL (ref 8.6–12.4)
PLATELETS: 282 10*3/uL (ref 150–400)
RBC: 3.94 MIL/uL — AB (ref 4.22–5.81)
RDW: 18.6 % — AB (ref 11.5–15.5)
WBC: 9 10*3/uL (ref 4.0–10.5)

## 2015-06-13 LAB — BASIC METABOLIC PANEL
BUN: 16 mg/dL (ref 6–23)
CO2: 28 mEq/L (ref 19–32)
Calcium: 9.2 mg/dL (ref 8.4–10.5)
Chloride: 107 mEq/L (ref 96–112)
Creat: 1.69 mg/dL — ABNORMAL HIGH (ref 0.50–1.35)
Glucose, Bld: 85 mg/dL (ref 70–99)
POTASSIUM: 3.3 meq/L — AB (ref 3.5–5.3)
SODIUM: 148 meq/L — AB (ref 135–145)

## 2015-06-13 LAB — D-DIMER, QUANTITATIVE (NOT AT ARMC): D DIMER QUANT: 0.47 ug{FEU}/mL (ref 0.00–0.48)

## 2015-06-13 NOTE — Telephone Encounter (Signed)
Rx(s) sent to pharmacy electronically.  

## 2015-06-13 NOTE — Telephone Encounter (Signed)
Patient has appointment with [facility] today at 3:00 pm. Patient aware of appointment.

## 2015-06-13 NOTE — Progress Notes (Signed)
Cardiology Office Note Date:  06/13/2015  Patient ID:  Frank Mclean, Frank Mclean 20-Feb-1941, MRN [id number] PCP:  [name], MD  Cardiologist:  Antoine Poche Electrophysiologist: Ladona Ridgel   Chief Complaint: leg swelling  History of Present Illness: Frank Mclean is a 74 y.o. 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 He was seen in the ER on 06/09/15 with similar complaints at which time Hgb was 8.6 (runs 8-10 range), Na 148, K 3.5, Cl 16, glu 118, Cr 1.85 (most recently 1.93), BNP 384. CXR had shown small left pleural effusion and COPD. He was noted to have wheezing and rhonchi. He was treated with IV Lasix and breathing treatment. He was advised to continue torsemide at 15mg  daily for the next 3 days but called in because it wasn't helping. Last echo 02/2015 EF 30-35%, grade 3 DD, mod MR, mod LAE, mod reduced RV function, severely dilated RA, PASP .  He comes in today still noting continued LEE. He also notes DOE that is fairly new over the last week or so. He denies any chest pain. He drinks 4 bottles of water a day, sometimes more. His brother-in-law says before this happened he was taking varying amounts of torsemide, sometimes once daily, sometimes 1.5 tablets daily. He reports definite compliance with torsemide 15mg  daily since ER visit on 06/09/15 and has had minimal relief. Weight has been stable at home - no significant gains. No chest pain, bleeding, syncope. Device check showed uptrend in Optivol over recent weeks but has not exceeded the volume excess trigger. Device was functioning normally without significant events. He had unnecessary  RV pacing noted and rep adjusted settings to help with this.  Past Medical History  Diagnosis Date  . Gout   . HTN (hypertension)   . Hyperlipidemia   . Osteoarthritis   . History of CVA (cerebrovascular accident)     Left pontine infarct July 2004; changed from Plavix to Coumadin in 2004 per MD at [facility]  . GERD (gastroesophageal reflux disease)   . DJD (degenerative joint disease)   . BPH (benign prostatic hypertrophy)   . Peripheral vascular disease   . Bilateral leg ulcer     ACHILLES AREA--  NONHEALING  . CKD (chronic kidney disease) stage 3, GFR 30-59 ml/min   . CAD (coronary artery disease)     a. LHC 4/12: Mid LAD 25%, mid diagonal 30%, AV circumflex 40%, proximal OM 25%, distal RCA 40%  . Chronic combined systolic and diastolic heart failure     a. Echo 05/2013: EF 25-30%, diffuse HK, restrictive physiology, trivial AI, mild MR, moderate LAE, reduced RV systolic function, PASP 42  . LBBB (left bundle branch block)   . PAF (paroxysmal atrial fibrillation)     a. amiodarone Rx started 05/2013;  b. chronic coumadin  . NICM (nonischemic cardiomyopathy)     a. EF 25-30%.  [name] Hx of cardiovascular stress test     Nuclear Stress Test (1/16): High risk stress nuclear study with a large, severe, partially reversible inferior and apical defect consistent with prior inferior and apical infarct; mild apical ischemia; severe LVE; study high risk due to reduced LV function.  EF 25% >>  reviewed with Dr. Antoine Poche >> medical mgmt  . Hypothyroidism   . DM (diabetes mellitus), type 2     Past Surgical History  Procedure Laterality Date  . Cardiac catheterization  04-16-2011   DR [facility]    NON-OBSTRUCTIVE CAD. MILDLY ELEVATED PULMONARY PRESSURES/ ELEVATED  END-DIASTOLIC PRESSURE  . Transthoracic echocardiogram  12-31-2010    MODERATE CONCENTRIC LVH/ SYSTOLIC FUNCTION SEVERELY REDUCED/ EF 25-30%/  SEVERE HYPOKINESIS OF ANTEROSEPTAL MYOCARDIUM  AND ENTIREAPICAL MYOCARDIUM /  MODERATE  HYPOKINESIS OF LATERAL, INFEROLATERAL, INFERIOR,AND INFEROSEPTAL MYOCARIUM/  GRADE 3 DIASTOLIC DYSFUNCTION/ MILD MR  . Aortogram w/ bilateral lower extremitiy runoff  05-18-2013  DR [facility]    LEFT LEG OCCLUDED PERONEAL AND ANTERIOR TIBIAL ARTERIES/ HIGH GRADE STENOIS 80% MIDDLE AND DISTAL THIRD OF POSTERIOR TIBIAL ARTERY/ RIGHT PERONEAL AND ANTERIOR TIBIAL ARTERY OCCLUDED/ 40% STENOSIS DISTALLY   . Abdominal aortagram N/A 05/18/2013    Procedure: ABDOMINAL Ronny Flurry;  Surgeon: Sherren Kerns, MD;  Location: [facility] CATH LAB;  Service: Cardiovascular;  Laterality: N/A;  . Lower extremity angiogram Bilateral 05/18/2013    Procedure: LOWER EXTREMITY ANGIOGRAM;  Surgeon: Sherren Kerns, MD;  Location: [facility] CATH LAB;  Service: Cardiovascular;  Laterality: Bilateral;  . Right heart catheterization N/A 02/13/2015    Procedure: RIGHT HEART CATH;  Surgeon: Laurey Morale, MD;  Location: [facility] CATH LAB;  Service: Cardiovascular;  Laterality: N/A;  . Bi-ventricular implantable cardioverter defibrillator N/A 03/12/2015    Procedure: BI-VENTRICULAR IMPLANTABLE CARDIOVERTER DEFIBRILLATOR  (CRT-D);  Surgeon: Marinus Maw, MD;  Location: [facility] CATH LAB;  Service: Cardiovascular;  Laterality: N/A;    Current Outpatient Prescriptions  Medication Sig Dispense Refill  . acetaminophen (TYLENOL) 500 MG tablet Take 500 mg by mouth every 6 (six) hours as needed.    [name] amLODipine (NORVASC) 10 MG tablet TAKE 1 TABLET BY MOUTH EVERY DAY 30 tablet 6  . atorvastatin (LIPITOR) 20 MG tablet Take 1 tablet (20 mg total) by mouth daily. 90 tablet 3  . carvedilol (COREG) 12.5 MG tablet Take 1 tablet (12.5 mg total) by mouth 2 (two) times daily with a meal. 60 tablet 11  . cholecalciferol (VITAMIN D) 1000 UNITS tablet Take 1 tablet (1,000 Units total) by mouth daily. 30 tablet 1  . CVS VITAMIN B12 1000 MCG tablet TAKE 1 TABLET BY MOUTH DAILY 100 tablet 1  . escitalopram (LEXAPRO) 5 MG tablet Take 1 tablet (5 mg total) by mouth daily. 30  tablet 5  . ferrous sulfate 325 (65 FE) MG tablet Take 1 tablet (325 mg total) by mouth daily. 30 tablet 6  . glipiZIDE (GLUCOTROL) 5 MG tablet TAKE 1/2 TABLET BY MOUTH TWICE A DAY BEFORE A MEAL 30 tablet 11  . hydrALAZINE (APRESOLINE) 100 MG tablet Take 1 tablet (100 mg total) by mouth 3 (three) times daily. 90 tablet 11  . HYDROcodone-acetaminophen (NORCO/VICODIN) 5-325 MG per tablet Take 1 tablet by mouth every 6 (six) hours as needed for moderate pain. 60 tablet 0  . isosorbide mononitrate (IMDUR) 60 MG 24 hr tablet Take 1.5 tablets for 90 mg by mouth twice a day. 45 tablet 11  . Multiple Vitamin (MULTIVITAMIN WITH MINERALS) TABS tablet Take 1 tablet by mouth daily.    . OXYGEN Inhale 2.5 L into the lungs See admin instructions. USES OXYGEN EVERY BEDTIME USES DURING DAY ONLY AS NEEDED    . pantoprazole (PROTONIX) 40 MG tablet Take 1 tablet (40 mg total) by mouth 2 (two) times daily. 60 tablet 1  . polyethylene glycol (  MIRALAX / GLYCOLAX) packet Take 17 g by mouth daily. 14 each 0  . potassium chloride SA (K-DUR,KLOR-CON) 20 MEQ tablet Take 1 tablet (20 mEq total) by mouth daily. 30 tablet 1  . SYMBICORT 160-4.5 MCG/ACT inhaler INHALE 2 PUFFS INTO THE LUNGS 2 TIMES DAILY 10.2 Inhaler 5  . SYNTHROID 100 MCG tablet TAKE 2 TABLETS BY MOUTH EVERY MORNING BEFORE BREAKFAST 60 tablet 11  . torsemide (DEMADEX) 10 MG tablet Take 1 tablet (10 mg total) by mouth daily. 30 tablet 1  . warfarin (COUMADIN) 5 MG tablet 5 mg tablet daily except one half tablet on Mondays and Fridays 30 tablet 1   No current facility-administered medications for this visit.    Allergies:   Levothyroxine sodium; Chicken protein; and Eggs or egg-derived products   Social History:  The patient  reports that he quit smoking about 41 years ago. He has never used smokeless tobacco. He reports that he does not drink alcohol or use illicit drugs.   Family History:  [name] family history includes Diabetes in his mother; Gout  in his other; Heart attack in his maternal grandmother; Heart disease in his father and mother; Hypertension in his father and mother; Stroke in his other. There is no history of Thyroid disease.  ROS:  Please see the history of present illness.  All other systems are reviewed and otherwise negative.   PHYSICAL EXAM:  VS:  BP 140/70 mmHg  Pulse 62  Ht 6' 2.5" (1.892 m)  Wt 216 lb 12.8 oz (98.34 kg)  BMI 27.47 kg/m2 BMI: Body mass index is 27.47 kg/(m^2). Well nourished, well developed AAM, in no acute distress HEENT: normocephalic, atraumatic Neck: no JVD, carotid bruits or masses Cardiac:  normal S1, S2; RRR; no murmurs, rubs, or gallops Lungs:  diminshed BS at left base, no wheezing, rhonchi or rales Abd: soft, nontender, no hepatomegaly, + BS MS: no deformity or atrophy Ext: 1+ BLE edema Skin: warm and dry, no rash Neuro:  moves all extremities spontaneously, no focal abnormalities noted, follows commands Psych: euthymic mood, full affect  EKG:  Done today shows ventricular pacing 62bpm  Recent Labs: 06/24/2014: Magnesium 2.0 01/23/2015: Pro B Natriuretic peptide (BNP) 432.0* 04/21/2015: ALT 19; TSH 0.38 06/09/2015: B Natriuretic Peptide 384.2*; BUN 17; Creatinine, Ser 1.85*; Hemoglobin 8.6*; Platelets 176; Potassium 3.5; Sodium 148*  06/24/2014: Cholesterol 269*; HDL 56.50; LDL Cholesterol 186*; Total CHOL/HDL Ratio 5; Triglycerides 133.0; VLDL 26.6   Estimated Creatinine Clearance: 40.1 mL/min (by C-G formula based on Cr of 1.85).   Wt Readings from Last 3 Encounters:  06/13/15 216 lb 12.8 oz (98.34 kg)  06/09/15 216 lb 6.4 oz (98.158 kg)  05/29/15 212 lb (96.163 kg)     Other studies reviewed: Additional studies/records reviewed today include: summarized above  ASSESSMENT AND PLAN:  1. Acute on chronic combined CHF/NICM - at present time his symptom complex (DOE, LEE) seem most likely to represent CHF although there are some components of the picture that don't totally fit -  i.e. his hypernatremia, lack of weight gain. However, his family member also says he hasn't been eating well lately so he could be replacing prior body weight with fluid weight. Discussed complex case with Dr. Clifton James. At this time are recommending to increase Torsemide to  daily and scale back on fluid intake to 6 glasses of fluid daily max. Will recheck BMET/BNP today given his CKD and then recheck on Monday to help guide next steps. He had recent decline in  Hgb thus will check CBC as well. Given that the pieces of his presentation do not 100% fit with CHF will also undertake d-dimer to exclude DVT/PE. If this is abnormal and symptoms do not continue to improve with diuresis we will need to arrange further workup - would probably start with LE duplex. Ultimately given patient complexity along with pulmonary hypertension I do agree with prior notes that he may benefit from the CHF clinic. Will refer.  2. CKD stage III with recent hypernatremia - see above. Following labs closely. 3. Essential HTN - he is on amlodipine which could be causing his LEE, but do not feel this would be causing his dyspnea. 4. H/o anemia - recheck Hgb today. 5. Nonobstructive CAD in 2012 - no recent CP. Continue BB, statin.  Disposition: Will tentatively arrange CHF referral. When patient comes in for BMET on Monday I asked him to report his symptoms and we will base our plans for follow-up on how he is doing on the higher dose.  Current medicines are reviewed at length with the patient today.  The patient did not have any concerns regarding medicines.  Thomasene Mohair PA-C 06/13/2015 3:56 PM     [facility] [address] [place] [address] [phone] (office)  [phone] (fax)

## 2015-06-13 NOTE — Patient Instructions (Addendum)
Medication Instructions:   JUST FOR THE WEEKEND  TAKE 20 MG OF TORSEMIDE DAILY    Labwork:  BMET CBC AND D DIMER TODAY   RETURN Monday  FOR BMET AND BNP    Testing/Procedures:   Follow-Up:  WITH CHF CLINIC NEXT AVAILABLE APPT  FOR CHRONIC  SYSTOLIC HEART FAILURE    Any Other Special Instructions Will Be Listed Below (If Applicable).

## 2015-06-13 NOTE — Progress Notes (Signed)
Add-on per Ronie Spies due to SOB & PE. Normal device function. Thresholds and sensing consistent with previous device measurements. Impedance trends stable over time. No evidence of any ventricular arrhythmias. No mode switches. Histogram distribution appropriate for patient and level of activity. No changes made this session. Device programmed at appropriate safety margins---changed RA output from 3.5 to 2.0V, RV output from 3.5 to 2.5V. Device programmed to optimize intrinsic conduction---changed paced/sensed from 200/180 to 300/263ms. Estimated longevity 10.82yrs. ROV as planned w/ GT on 07/15/15.

## 2015-06-14 ENCOUNTER — Other Ambulatory Visit: Payer: Self-pay | Admitting: Physician Assistant

## 2015-06-14 MED ORDER — POTASSIUM CHLORIDE CRYS ER 20 MEQ PO TBCR: EXTENDED_RELEASE_TABLET | ORAL | Status: DC

## 2015-06-16 ENCOUNTER — Other Ambulatory Visit: Payer: Self-pay

## 2015-06-16 ENCOUNTER — Other Ambulatory Visit (INDEPENDENT_AMBULATORY_CARE_PROVIDER_SITE_OTHER): Payer: Medicare Other | Admitting: *Deleted

## 2015-06-16 DIAGNOSIS — R899 Unspecified abnormal finding in specimens from other organs, systems and tissues: Secondary | ICD-10-CM

## 2015-06-16 DIAGNOSIS — R0600 Dyspnea, unspecified: Secondary | ICD-10-CM | POA: Diagnosis not present

## 2015-06-16 LAB — BASIC METABOLIC PANEL
BUN: 15 mg/dL (ref 6–23)
CHLORIDE: 108 meq/L (ref 96–112)
CO2: 32 mEq/L (ref 19–32)
CREATININE: 1.86 mg/dL — AB (ref 0.40–1.50)
Calcium: 9.1 mg/dL (ref 8.4–10.5)
GFR: 45.65 mL/min — ABNORMAL LOW (ref >60.00)
Glucose, Bld: 90 mg/dL (ref 70–99)
Potassium: 3.2 mEq/L — ABNORMAL LOW (ref 3.5–5.1)
SODIUM: 146 meq/L — AB (ref 135–145)

## 2015-06-16 LAB — BRAIN NATRIURETIC PEPTIDE: PRO B NATRI PEPTIDE: 405 pg/mL — AB (ref 0.0–100.0)

## 2015-06-16 MED ORDER — POTASSIUM CHLORIDE CRYS ER 20 MEQ PO TBCR: 30.0000 meq | EXTENDED_RELEASE_TABLET | Freq: Three times a day (TID) | ORAL | Status: DC

## 2015-06-16 MED ORDER — TORSEMIDE 20 MG PO TABS: 20.0000 mg | ORAL_TABLET | Freq: Every day | ORAL | Status: DC

## 2015-06-19 ENCOUNTER — Ambulatory Visit: Payer: Medicare Other | Admitting: Family Medicine

## 2015-06-19 NOTE — Progress Notes (Signed)
Cardiology Office Note   Date:  06/20/2015   ID:  Kyair, Ditommaso 1941/11/13, MRN [id number]  PCP:  [name], MD  Cardiologist:  Dr. Rollene Rotunda   Electrophysiologist:  Dr. Lewayne Bunting   Chief Complaint  Patient presents with  . Congestive Heart Failure     History of Present Illness: Frank Mclean is a 74 y.o. 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 He was seen in the ER on 06/09/15 with similar complaints at which time Hgb was 8.6 (runs 8-10 range), Cr 1.85 (most recently 1.93), BNP 384. CXR had shown small left pleural effusion and COPD. He was noted to have wheezing and rhonchi. He was treated with IV Lasix and breathing treatment. He was advised to continue torsemide at 15mg  daily for the next 3 days but called in because it wasn't helping. Last echo 02/2015 EF 30-35%, grade 3 DD, mod MR, mod LAE, mod reduced RV function, severely dilated RA, PASP .  He saw Ronie Spies, PA-C 06/13/15.  Torsemide dose was adjusted from 15 >> 20 mg daily. Lab work demonstrated stable/improved hemoglobin, slightly improved creatinine and hypokalemia.  D-dimer was negative.  Repeat labs on Monday of this week demonstrated fairly stable creatinine and continued hypokalemia (K 3.2) this. He returns for follow-up.   Since last week, he denies any improvement in his edema. Shortness of breath is fairly stable. He is NYHA 2b-3. He sleeps on 2 pillows chronically without change. He denies PND. He denies chest pain or syncope.   Studies/Reports Reviewed Today:  EPS 03/12/15 Biventricular ICD implantation  with the generator inserted, unable to place LV lead due to unsatisfactory anatomy.  Echo 02/26/15 - MildLVH. EF 30% to 35%.Grade 3diastolic dysfunction. - Aortic valve: There was trivial regurgitation. - Mitral valve: There was moderate regurgitation. - Left atrium: The atrium was moderately dilated. - Right ventricle: The cavity size was moderately dilated. Systolicfunction was moderately reduced. - Right atrium: The atrium was severely dilated. - Pulmonary arteries: Systolic pressure was moderately increased. PA peak pressure: 54 mm Hg (S).  Myoview 01/04/15 High risk stress nuclear study with a large, severe, partially reversible inferior and apical defect consistent with prior inferior and apical infarct; mild apical ischemia; severe LVE; study high risk due to reduced LV function.  LV Ejection Fraction: 25%. LV Wall Motion: Global hypokinesis. (med Rx recommended)    Past Medical History  Diagnosis Date  . Gout   . HTN (hypertension)   . Hyperlipidemia   . Osteoarthritis   . History of CVA (cerebrovascular accident)     Left pontine infarct July 2004; changed from Plavix to Coumadin in 2004 per MD at [facility]  . GERD (gastroesophageal reflux disease)   . DJD (degenerative joint disease)   . BPH (benign prostatic hypertrophy)   . Peripheral vascular disease   . Bilateral leg ulcer     ACHILLES AREA--  NONHEALING  . CKD (chronic kidney disease) stage 3, GFR 30-59 ml/min   . CAD (coronary artery disease)     a. LHC 4/12: Mid LAD 25%, mid diagonal 30%, AV circumflex 40%, proximal OM 25%,  distal RCA 40%  . Chronic combined systolic and diastolic heart failure     a. Echo 05/2013: EF 25-30%, diffuse HK, restrictive physiology, trivial AI, mild MR, moderate LAE, reduced RV systolic function, PASP 42  . LBBB (left bundle branch block)   . PAF (paroxysmal atrial fibrillation)     a. amiodarone Rx started 05/2013;  b. chronic coumadin  . NICM (nonischemic cardiomyopathy)     a.  EF 25-30%.  [name] Hx of cardiovascular stress test     Nuclear Stress Test (1/16): High risk stress nuclear study with a large, severe, partially reversible inferior and apical defect consistent with prior inferior and apical infarct; mild apical ischemia; severe LVE; study high risk due to reduced LV function.  EF 25% >> reviewed with Dr. Antoine Poche >> medical mgmt  . Hypothyroidism   . DM (diabetes mellitus), type 2     Past Surgical History  Procedure Laterality Date  . Cardiac catheterization  04-16-2011   DR [facility]    NON-OBSTRUCTIVE CAD. MILDLY ELEVATED PULMONARY PRESSURES/ ELEVATED  END-DIASTOLIC PRESSURE  . Transthoracic echocardiogram  12-31-2010    MODERATE CONCENTRIC LVH/ SYSTOLIC FUNCTION SEVERELY REDUCED/ EF 25-30%/  SEVERE HYPOKINESIS OF ANTEROSEPTAL MYOCARDIUM  AND ENTIREAPICAL MYOCARDIUM /  MODERATE HYPOKINESIS OF LATERAL, INFEROLATERAL, INFERIOR,AND INFEROSEPTAL MYOCARIUM/  GRADE 3 DIASTOLIC DYSFUNCTION/ MILD MR  . Aortogram w/ bilateral lower extremitiy runoff  05-18-2013  DR FIELDS    LEFT LEG OCCLUDED PERONEAL AND ANTERIOR TIBIAL ARTERIES/ HIGH GRADE STENOIS 80% MIDDLE AND DISTAL THIRD OF POSTERIOR TIBIAL ARTERY/ RIGHT PERONEAL AND ANTERIOR TIBIAL ARTERY OCCLUDED/ 40% STENOSIS DISTALLY   . Abdominal aortagram N/A 05/18/2013    Procedure: ABDOMINAL Ronny Flurry;  Surgeon: Sherren Kerns, MD;  Location: [facility] CATH LAB;  Service: Cardiovascular;  Laterality: N/A;  . Lower extremity angiogram Bilateral 05/18/2013    Procedure: LOWER EXTREMITY ANGIOGRAM;  Surgeon: Sherren Kerns, MD;  Location: [facility] CATH LAB;  Service: Cardiovascular;  Laterality: Bilateral;  . Right heart catheterization N/A 02/13/2015    Procedure: RIGHT HEART CATH;  Surgeon: Laurey Morale, MD;  Location: [facility] CATH LAB;  Service: Cardiovascular;  Laterality: N/A;  . Bi-ventricular implantable cardioverter defibrillator N/A 03/12/2015    Procedure: BI-VENTRICULAR IMPLANTABLE CARDIOVERTER DEFIBRILLATOR  (CRT-D);  Surgeon:  Marinus Maw, MD;  Location: [facility] CATH LAB;  Service: Cardiovascular;  Laterality: N/A;     Current Outpatient Prescriptions  Medication Sig Dispense Refill  . acetaminophen (TYLENOL) 500 MG tablet Take 500 mg by mouth every 6 (six) hours as needed.    [name] amLODipine (NORVASC) 10 MG tablet TAKE 1 TABLET BY MOUTH EVERY DAY 30 tablet 6  . atorvastatin (LIPITOR) 20 MG tablet Take 1 tablet (20 mg total) by mouth daily. 90 tablet 3  . carvedilol (COREG) 12.5 MG tablet Take 1 tablet (12.5 mg total) by mouth 2 (two) times daily with a meal. 60 tablet 11  . cholecalciferol (VITAMIN D) 1000 UNITS tablet Take 1 tablet (1,000 Units total) by mouth daily. 30 tablet 1  . CVS VITAMIN B12 1000 MCG tablet TAKE 1 TABLET BY MOUTH DAILY 100 tablet 1  . escitalopram (LEXAPRO) 5 MG tablet Take 1 tablet (5 mg total) by mouth daily. 30 tablet 5  . ferrous sulfate 325 (65 FE) MG tablet Take 1 tablet (325 mg total) by mouth daily. 30 tablet 6  . glipiZIDE (GLUCOTROL) 5 MG tablet TAKE 1/2 TABLET BY MOUTH TWICE A DAY BEFORE A MEAL 30 tablet 11  . hydrALAZINE (APRESOLINE) 100 MG tablet  Take 1 tablet (100 mg total) by mouth 3 (three) times daily. 90 tablet 11  . HYDROcodone-acetaminophen (NORCO/VICODIN) 5-325 MG per tablet Take 1 tablet by mouth every 6 (six) hours as needed for moderate pain. 60 tablet 0  . isosorbide mononitrate (IMDUR) 60 MG 24 hr tablet Take 1.5 tablets for 90 mg by mouth twice a day. 45 tablet 11  . Multiple Vitamin (MULTIVITAMIN WITH MINERALS) TABS tablet Take 1 tablet by mouth daily.    . OXYGEN Inhale 2.5 L into the lungs See admin instructions. USES OXYGEN EVERY BEDTIME USES DURING DAY ONLY AS NEEDED    . pantoprazole (PROTONIX) 40 MG tablet Take 1 tablet (40 mg total) by mouth 2 (two) times daily. 60 tablet 1  . polyethylene glycol (MIRALAX / GLYCOLAX) packet Take 17 g by mouth daily. 14 each 0  . potassium chloride SA (K-DUR,KLOR-CON) 20 MEQ tablet Take 1.5 tablets (30 mEq total) by mouth 3  (three) times daily.    . SYMBICORT 160-4.5 MCG/ACT inhaler INHALE 2 PUFFS INTO THE LUNGS 2 TIMES DAILY 10.2 Inhaler 5  . SYNTHROID 100 MCG tablet TAKE 2 TABLETS BY MOUTH EVERY MORNING BEFORE BREAKFAST 60 tablet 11  . torsemide (DEMADEX) 20 MG tablet Take 1 tablet (20 mg total) by mouth daily.    [name] warfarin (COUMADIN) 5 MG tablet 5 mg tablet daily except one half tablet on Mondays and Fridays 30 tablet 1   No current facility-administered medications for this visit.    Allergies:   Levothyroxine sodium; Chicken protein; and Eggs or egg-derived products    Social History:  The patient  reports that he quit smoking about 41 years ago. He has never used smokeless tobacco. He reports that he does not drink alcohol or use illicit drugs.   Family History:  [name] family history includes Diabetes in his mother; Gout in his other; Heart attack in his maternal grandmother; Heart disease in his father and mother; Hypertension in his father and mother; Stroke in his other. There is no history of Thyroid disease.    ROS:   Please see the history of present illness.   Review of Systems  Cardiovascular: Positive for dyspnea on exertion and leg swelling.  Musculoskeletal: Positive for joint swelling.  All other systems reviewed and are negative.     PHYSICAL EXAM: VS:  BP 120/80 mmHg  Pulse 63  Ht 6' 2.5" (1.892 m)  Wt 216 lb (97.977 kg)  BMI 27.37 kg/m2  SpO2 99%    Wt Readings from Last 3 Encounters:  06/20/15 216 lb (97.977 kg)  06/13/15 216 lb 12.8 oz (98.34 kg)  06/09/15 216 lb 6.4 oz (98.158 kg)     GEN: Well nourished, well developed, in no acute distress HEENT: normal Neck: no JVD,  no masses Cardiac:  Normal S1/S2, RRR; no murmur ,  no rubs or gallops, 1+ bilateral ankle/LE edema   Respiratory:  Decreased breath sounds bilaterally, no wheezing, rhonchi or rales. GI: soft, nontender, nondistended, + BS MS: no deformity or atrophy Skin: warm and dry  Neuro:  CNs II-XII  intact, Strength and sensation are intact Psych: Normal affect   EKG:  EKG is ordered today.  It demonstrates:   NSR, HR 63, LBBB   Recent Labs: 06/24/2014: Magnesium 2.0 04/21/2015: ALT 19; TSH 0.38 06/09/2015: B Natriuretic Peptide 384.2* 06/13/2015: Hemoglobin 9.3*; Platelets 282 06/16/2015: BUN 15; Creatinine, Ser 1.86*; Potassium 3.2*; Pro B Natriuretic peptide (BNP) 405.0*; Sodium 146*    Lipid Panel  Component Value Date/Time   CHOL 269* 06/24/2014 1139   TRIG 133.0 06/24/2014 1139   TRIG 89 12/21/2006 1117   HDL 56.50 06/24/2014 1139   CHOLHDL 5 06/24/2014 1139   CHOLHDL 3.8 CALC 12/21/2006 1117   VLDL 26.6 06/24/2014 1139   LDLCALC 186* 06/24/2014 1139   LDLDIRECT 177.9 06/17/2010 0909      ASSESSMENT AND PLAN:  Chronic combined systolic and diastolic CHF (congestive heart failure): He is NYHA 2b-3. He continues to have LE edema. However, he does not have JVD on exam and his lungs are clear. His weight been stable. His diet has been poor over the last several weeks. Question if his LE edema is multifactorial and related to venous insufficiency, hypoalbuminemia and CHF. I will obtain a BMET, BNP, LFTs. If his BNP is unchanged or increased, I will further adjust his torsemide. If his albumen is low, I suspect this would be a major cause of his increasingly edema. In this case, he would need to increase protein in his diet.  Keep follow-up with me in a couple of weeks and CHF clinic later this month.  NICM (nonischemic cardiomyopathy):  Continue beta blocker, hydralazine, nitrates. He is not on ACE inhibitor or ARB secondary to chronic kidney disease.  PAF (paroxysmal atrial fibrillation):  Maintaining NSR. He is tolerating Coumadin.  Coronary artery disease involving native coronary artery of native heart without angina pectoris:  No angina. He is not on aspirin as he is on Coumadin. Continue statin, beta blocker, nitrates.  ICD (implantable cardioverter-defibrillator) in  place:  Follow-up with Dr. Ladona Ridgel as planned.  CKD (chronic kidney disease) stage 3, GFR 30-59 ml/min:  Repeat BMET today.  Anemia in chronic renal disease:  Recent hemoglobin stable/improved.  Hypothyroidism, unspecified hypothyroidism type:  Recent TSH normal.  Essential hypertension:  Controlled.    Hyperlipidemia:  Continue statin.   Medication Changes: Current medicines are reviewed at length with the patient today.  Concerns regarding medicines are as outlined above.  The following changes have been made:   Discontinued Medications   No medications on file   Modified Medications   No medications on file   New Prescriptions   No medications on file     Labs/ tests ordered today include:   Orders Placed This Encounter  Procedures  . Hepatic function panel  . B Nat Peptide  . EKG 12-Lead     Disposition:   He requested to not FU with Dr. Rollene Rotunda in the future.  Will have him FU with Dr. Marca Ancona in the HF Clinic and me here.    Signed, Brynda Rim, MHS 06/20/2015 1:13 PM    [facility] [address] [place], [place], [place]  [address] Phone: [phone]; Fax: [phone]

## 2015-06-20 ENCOUNTER — Encounter: Payer: Self-pay | Admitting: Physician Assistant

## 2015-06-20 ENCOUNTER — Ambulatory Visit (INDEPENDENT_AMBULATORY_CARE_PROVIDER_SITE_OTHER): Payer: Medicare Other | Admitting: Physician Assistant

## 2015-06-20 ENCOUNTER — Other Ambulatory Visit (INDEPENDENT_AMBULATORY_CARE_PROVIDER_SITE_OTHER): Payer: Medicare Other | Admitting: *Deleted

## 2015-06-20 ENCOUNTER — Telehealth: Payer: Self-pay | Admitting: *Deleted

## 2015-06-20 VITALS — BP 120/80 | HR 63 | Ht 74.5 in | Wt 216.0 lb

## 2015-06-20 DIAGNOSIS — R0602 Shortness of breath: Secondary | ICD-10-CM

## 2015-06-20 DIAGNOSIS — I429 Cardiomyopathy, unspecified: Secondary | ICD-10-CM | POA: Diagnosis not present

## 2015-06-20 DIAGNOSIS — I480 Paroxysmal atrial fibrillation: Secondary | ICD-10-CM

## 2015-06-20 DIAGNOSIS — D631 Anemia in chronic kidney disease: Secondary | ICD-10-CM

## 2015-06-20 DIAGNOSIS — I639 Cerebral infarction, unspecified: Secondary | ICD-10-CM

## 2015-06-20 DIAGNOSIS — N183 Chronic kidney disease, stage 3 unspecified: Secondary | ICD-10-CM

## 2015-06-20 DIAGNOSIS — E785 Hyperlipidemia, unspecified: Secondary | ICD-10-CM

## 2015-06-20 DIAGNOSIS — N189 Chronic kidney disease, unspecified: Secondary | ICD-10-CM

## 2015-06-20 DIAGNOSIS — I5042 Chronic combined systolic (congestive) and diastolic (congestive) heart failure: Secondary | ICD-10-CM | POA: Diagnosis not present

## 2015-06-20 DIAGNOSIS — R899 Unspecified abnormal finding in specimens from other organs, systems and tissues: Secondary | ICD-10-CM

## 2015-06-20 DIAGNOSIS — I428 Other cardiomyopathies: Secondary | ICD-10-CM

## 2015-06-20 DIAGNOSIS — I2510 Atherosclerotic heart disease of native coronary artery without angina pectoris: Secondary | ICD-10-CM

## 2015-06-20 DIAGNOSIS — E039 Hypothyroidism, unspecified: Secondary | ICD-10-CM

## 2015-06-20 DIAGNOSIS — Z95810 Presence of automatic (implantable) cardiac defibrillator: Secondary | ICD-10-CM

## 2015-06-20 DIAGNOSIS — I10 Essential (primary) hypertension: Secondary | ICD-10-CM

## 2015-06-20 LAB — BASIC METABOLIC PANEL
BUN: 18 mg/dL (ref 6–23)
CHLORIDE: 110 meq/L (ref 96–112)
CO2: 28 mEq/L (ref 19–32)
Calcium: 9.3 mg/dL (ref 8.4–10.5)
Creatinine, Ser: 1.96 mg/dL — ABNORMAL HIGH (ref 0.40–1.50)
GFR: 42.97 mL/min — ABNORMAL LOW (ref >60.00)
GLUCOSE: 100 mg/dL — AB (ref 70–99)
Potassium: 4.5 mEq/L (ref 3.5–5.1)
Sodium: 146 mEq/L — ABNORMAL HIGH (ref 135–145)

## 2015-06-20 LAB — HEPATIC FUNCTION PANEL
ALT: 18 U/L (ref 0–53)
AST: 14 U/L (ref 0–37)
Albumin: 3.8 g/dL (ref 3.5–5.2)
Alkaline Phosphatase: 95 U/L (ref 39–117)
BILIRUBIN TOTAL: 0.7 mg/dL (ref 0.2–1.2)
Bilirubin, Direct: 0.1 mg/dL (ref 0.0–0.3)
TOTAL PROTEIN: 6.6 g/dL (ref 6.0–8.3)

## 2015-06-20 LAB — MAGNESIUM: Magnesium: 2.1 mg/dL (ref 1.5–2.5)

## 2015-06-20 LAB — BRAIN NATRIURETIC PEPTIDE: Pro B Natriuretic peptide (BNP): 539 pg/mL — ABNORMAL HIGH (ref 0.0–100.0)

## 2015-06-20 NOTE — Addendum Note (Signed)
Addended by: Tonita Phoenix on: 06/20/2015 11:07 AM   Modules accepted: Orders

## 2015-06-20 NOTE — Telephone Encounter (Signed)
I s/w pt's wife abot lab results and med changes to increase torsemide 20 BID x 3 days then resume 20 mg daily . She is in [place] w/her daughter who had surgery however he brother is taking care of pt and she will let him know of the med change and bmet 7/8.Marland KitchenShe did ask if pt could have metolazone. Will d/w PA;

## 2015-06-20 NOTE — Patient Instructions (Signed)
Medication Instructions:  Your physician recommends that you continue on your current medications as directed. Please refer to the Current Medication list given to you today.   Labwork: TODAY BMET, LFT, BNP;   Testing/Procedures: NONE  Follow-Up: KEEP YOUR APPT WITH SCOTT WEAVER, [facility] 07/02/15  YOU HAVE AN APPT WITH DR. Ladona Ridgel 07/14/15  Any Other Special Instructions Will Be Listed Below (If Applicable).

## 2015-06-25 ENCOUNTER — Ambulatory Visit: Payer: Medicare Other | Admitting: Cardiology

## 2015-06-25 ENCOUNTER — Telehealth: Payer: Self-pay | Admitting: *Deleted

## 2015-06-25 DIAGNOSIS — I5023 Acute on chronic systolic (congestive) heart failure: Secondary | ICD-10-CM

## 2015-06-25 NOTE — Telephone Encounter (Signed)
I s/w pt's wife today and advised per Bing Neighbors. PA he wants to see how the increased Torsemide does 1 st before going to Metolazone. Verified lab appt 7/8 and PA appt 7/13.

## 2015-06-27 ENCOUNTER — Other Ambulatory Visit (INDEPENDENT_AMBULATORY_CARE_PROVIDER_SITE_OTHER): Payer: Medicare Other | Admitting: *Deleted

## 2015-06-27 DIAGNOSIS — I5023 Acute on chronic systolic (congestive) heart failure: Secondary | ICD-10-CM | POA: Diagnosis not present

## 2015-06-27 LAB — BASIC METABOLIC PANEL
BUN: 18 mg/dL (ref 6–23)
CALCIUM: 9.3 mg/dL (ref 8.4–10.5)
CO2: 28 mEq/L (ref 19–32)
Chloride: 114 mEq/L — ABNORMAL HIGH (ref 96–112)
Creatinine, Ser: 2.02 mg/dL — ABNORMAL HIGH (ref 0.40–1.50)
GFR: 41.5 mL/min — ABNORMAL LOW (ref >60.00)
GLUCOSE: 82 mg/dL (ref 70–99)
POTASSIUM: 4.1 meq/L (ref 3.5–5.1)
SODIUM: 149 meq/L — AB (ref 135–145)

## 2015-06-27 NOTE — Addendum Note (Signed)
Addended by: Tonita Phoenix on: 06/27/2015 10:33 AM   Modules accepted: Orders

## 2015-07-02 ENCOUNTER — Ambulatory Visit (INDEPENDENT_AMBULATORY_CARE_PROVIDER_SITE_OTHER): Payer: Medicare Other | Admitting: Physician Assistant

## 2015-07-02 ENCOUNTER — Encounter: Payer: Self-pay | Admitting: Physician Assistant

## 2015-07-02 VITALS — BP 126/78 | HR 58 | Ht 74.5 in | Wt 205.1 lb

## 2015-07-02 DIAGNOSIS — D631 Anemia in chronic kidney disease: Secondary | ICD-10-CM

## 2015-07-02 DIAGNOSIS — N183 Chronic kidney disease, stage 3 unspecified: Secondary | ICD-10-CM

## 2015-07-02 DIAGNOSIS — I480 Paroxysmal atrial fibrillation: Secondary | ICD-10-CM | POA: Diagnosis not present

## 2015-07-02 DIAGNOSIS — N189 Chronic kidney disease, unspecified: Secondary | ICD-10-CM

## 2015-07-02 DIAGNOSIS — I639 Cerebral infarction, unspecified: Secondary | ICD-10-CM

## 2015-07-02 DIAGNOSIS — E785 Hyperlipidemia, unspecified: Secondary | ICD-10-CM

## 2015-07-02 DIAGNOSIS — I2510 Atherosclerotic heart disease of native coronary artery without angina pectoris: Secondary | ICD-10-CM

## 2015-07-02 DIAGNOSIS — Z95810 Presence of automatic (implantable) cardiac defibrillator: Secondary | ICD-10-CM

## 2015-07-02 DIAGNOSIS — I10 Essential (primary) hypertension: Secondary | ICD-10-CM

## 2015-07-02 DIAGNOSIS — E039 Hypothyroidism, unspecified: Secondary | ICD-10-CM

## 2015-07-02 DIAGNOSIS — I429 Cardiomyopathy, unspecified: Secondary | ICD-10-CM | POA: Diagnosis not present

## 2015-07-02 DIAGNOSIS — I428 Other cardiomyopathies: Secondary | ICD-10-CM

## 2015-07-02 DIAGNOSIS — I5042 Chronic combined systolic (congestive) and diastolic (congestive) heart failure: Secondary | ICD-10-CM

## 2015-07-02 NOTE — Progress Notes (Signed)
Cardiology Office Note   Date:  07/02/2015   ID:  Frank Mclean, Frank Mclean 1941/02/03, MRN [id number]  PCP:  [name], MD  Cardiologist:  Dr. Rollene Rotunda   Electrophysiologist:  Dr. Lewayne Bunting   Chief Complaint  Patient presents with  . Congestive Heart Failure     History of Present Illness: Frank Mclean is a 74 y.o. 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 CXR had shown small left pleural effusion and COPD. He was noted to have wheezing and rhonchi. He was treated with IV Lasix and breathing treatment. Last echo 02/2015 EF 30-35%, grade 3 DD, mod MR, mod LAE, mod reduced RV function, severely dilated RA, PASP . He was seen back in the office with further adjustments in his torsemide. I saw him 2 weeks ago and adjusted his torsemide further. He returns for follow-up.  Since last seen, he has been doing well. His breathing is improved. His weight is down 9 pounds. LE edema has improved. He remains on 2 pillows at night. He denies PND. He denies chest pain or syncope. Type remains poor.   Studies/Reports Reviewed Today:  EPS 03/12/15 Biventricular ICD implantation with the generator inserted, unable to place LV lead due to unsatisfactory anatomy.  Echo 02/26/15 - MildLVH. EF 30% to 35%.Grade 3diastolic dysfunction. - Aortic valve: There was trivial regurgitation. - Mitral valve: There was moderate regurgitation. - Left atrium: The atrium was moderately dilated. - Right ventricle: The cavity size  was moderately dilated. Systolicfunction was moderately reduced. - Right atrium: The atrium was severely dilated. - Pulmonary arteries: Systolic pressure was moderately increased. PA peak pressure: 54 mm Hg (S).  Myoview 01/04/15 High risk stress nuclear study with a large, severe, partially reversible inferior and apical defect consistent with prior inferior and apical infarct; mild apical ischemia; severe LVE; study high risk due to reduced LV function.  LV Ejection Fraction: 25%. LV Wall Motion: Global hypokinesis. (med Rx recommended)    Past Medical History  Diagnosis Date  . Gout   . HTN (hypertension)   . Hyperlipidemia   . Osteoarthritis   . History of CVA (cerebrovascular accident)     Left pontine infarct July 2004; changed from Plavix to Coumadin in 2004 per MD at [facility]  . GERD (gastroesophageal reflux disease)   . DJD (degenerative joint disease)   . BPH (benign prostatic hypertrophy)   . Peripheral vascular disease   . Bilateral leg ulcer     ACHILLES AREA--  NONHEALING  . CKD (chronic kidney disease) stage 3, GFR 30-59 ml/min   . CAD (coronary artery disease)     a. LHC 4/12: Mid LAD 25%, mid diagonal 30%, AV circumflex 40%, proximal OM 25%, distal RCA 40%  . Chronic combined systolic and diastolic heart failure     a. Echo 05/2013: EF 25-30%, diffuse HK, restrictive physiology, trivial AI, mild MR, moderate LAE, reduced RV systolic function, PASP 42  . LBBB (left bundle branch block)   . PAF (paroxysmal atrial fibrillation)  a. amiodarone Rx started 05/2013;  b. chronic coumadin  . NICM (nonischemic cardiomyopathy)     a. EF 25-30%.  [name] Hx of cardiovascular stress test     Nuclear Stress Test (1/16): High risk stress nuclear study with a large, severe, partially reversible inferior and apical defect consistent with prior inferior and apical infarct; mild apical ischemia; severe LVE; study high risk due to reduced LV function.  EF 25% >> reviewed with Dr.  Antoine Poche >> medical mgmt  . Hypothyroidism   . DM (diabetes mellitus), type 2     Past Surgical History  Procedure Laterality Date  . Cardiac catheterization  04-16-2011   DR [facility]    NON-OBSTRUCTIVE CAD. MILDLY ELEVATED PULMONARY PRESSURES/ ELEVATED  END-DIASTOLIC PRESSURE  . Transthoracic echocardiogram  12-31-2010    MODERATE CONCENTRIC LVH/ SYSTOLIC FUNCTION SEVERELY REDUCED/ EF 25-30%/  SEVERE HYPOKINESIS OF ANTEROSEPTAL MYOCARDIUM  AND ENTIREAPICAL MYOCARDIUM /  MODERATE HYPOKINESIS OF LATERAL, INFEROLATERAL, INFERIOR,AND INFEROSEPTAL MYOCARIUM/  GRADE 3 DIASTOLIC DYSFUNCTION/ MILD MR  . Aortogram w/ bilateral lower extremitiy runoff  05-18-2013  DR FIELDS    LEFT LEG OCCLUDED PERONEAL AND ANTERIOR TIBIAL ARTERIES/ HIGH GRADE STENOIS 80% MIDDLE AND DISTAL THIRD OF POSTERIOR TIBIAL ARTERY/ RIGHT PERONEAL AND ANTERIOR TIBIAL ARTERY OCCLUDED/ 40% STENOSIS DISTALLY   . Abdominal aortagram N/A 05/18/2013    Procedure: ABDOMINAL Ronny Flurry;  Surgeon: Sherren Kerns, MD;  Location: [facility] CATH LAB;  Service: Cardiovascular;  Laterality: N/A;  . Lower extremity angiogram Bilateral 05/18/2013    Procedure: LOWER EXTREMITY ANGIOGRAM;  Surgeon: Sherren Kerns, MD;  Location: [facility] CATH LAB;  Service: Cardiovascular;  Laterality: Bilateral;  . Right heart catheterization N/A 02/13/2015    Procedure: RIGHT HEART CATH;  Surgeon: Laurey Morale, MD;  Location: [facility] CATH LAB;  Service: Cardiovascular;  Laterality: N/A;  . Bi-ventricular implantable cardioverter defibrillator N/A 03/12/2015    Procedure: BI-VENTRICULAR IMPLANTABLE CARDIOVERTER DEFIBRILLATOR  (CRT-D);  Surgeon: Marinus Maw, MD;  Location: [facility] CATH LAB;  Service: Cardiovascular;  Laterality: N/A;     Current Outpatient Prescriptions  Medication Sig Dispense Refill  . acetaminophen (TYLENOL) 500 MG tablet Take 500 mg by mouth every 6 (six) hours as needed.    [name] amLODipine (NORVASC) 10 MG tablet TAKE 1 TABLET BY MOUTH EVERY DAY 30 tablet 6    . atorvastatin (LIPITOR) 20 MG tablet Take 1 tablet (20 mg total) by mouth daily. 90 tablet 3  . carvedilol (COREG) 12.5 MG tablet Take 1 tablet (12.5 mg total) by mouth 2 (two) times daily with a meal. 60 tablet 11  . cholecalciferol (VITAMIN D) 1000 UNITS tablet Take 1 tablet (1,000 Units total) by mouth daily. 30 tablet 1  . CVS VITAMIN B12 1000 MCG tablet TAKE 1 TABLET BY MOUTH DAILY 100 tablet 1  . escitalopram (LEXAPRO) 5 MG tablet Take 1 tablet (5 mg total) by mouth daily. 30 tablet 5  . ferrous sulfate 325 (65 FE) MG tablet Take 1 tablet (325 mg total) by mouth daily. 30 tablet 6  . glipiZIDE (GLUCOTROL) 5 MG tablet TAKE 1/2 TABLET BY MOUTH TWICE A DAY BEFORE A MEAL 30 tablet 11  . hydrALAZINE (APRESOLINE) 100 MG tablet Take 1 tablet (100 mg total) by mouth 3 (three) times daily. 90 tablet 11  . HYDROcodone-acetaminophen (NORCO/VICODIN) 5-325 MG per tablet Take 1 tablet by mouth every 6 (six) hours as needed for moderate pain. 60 tablet 0  . isosorbide mononitrate (IMDUR) 60 MG 24 hr tablet Take 1.5 tablets for  90 mg by mouth twice a day. 45 tablet 11  . Multiple Vitamin (MULTIVITAMIN WITH MINERALS) TABS tablet Take 1 tablet by mouth daily.    . OXYGEN Inhale 2.5 L into the lungs See admin instructions. USES OXYGEN EVERY BEDTIME USES DURING DAY ONLY AS NEEDED    . pantoprazole (PROTONIX) 40 MG tablet Take 1 tablet (40 mg total) by mouth 2 (two) times daily. 60 tablet 1  . polyethylene glycol (MIRALAX / GLYCOLAX) packet Take 17 g by mouth daily. 14 each 0  . potassium chloride SA (K-DUR,KLOR-CON) 20 MEQ tablet Take 1.5 tablets (30 mEq total) by mouth 3 (three) times daily.    . SYMBICORT 160-4.5 MCG/ACT inhaler INHALE 2 PUFFS INTO THE LUNGS 2 TIMES DAILY 10.2 Inhaler 5  . SYNTHROID 100 MCG tablet TAKE 2 TABLETS BY MOUTH EVERY MORNING BEFORE BREAKFAST 60 tablet 11  . torsemide (DEMADEX) 20 MG tablet Take 1 tablet (20 mg total) by mouth daily.    [name] warfarin (COUMADIN) 5 MG tablet 5 mg  tablet daily except one half tablet on Mondays and Fridays 30 tablet 1   No current facility-administered medications for this visit.    Allergies:   Levothyroxine sodium; Chicken protein; and Eggs or egg-derived products    Social History:  The patient  reports that he quit smoking about 41 years ago. He has never used smokeless tobacco. He reports that he does not drink alcohol or use illicit drugs.   Family History:  [name] family history includes Diabetes in his mother; Gout in his other; Heart attack in his maternal grandmother; Heart disease in his father and mother; Hypertension in his father and mother; Stroke in his other. There is no history of Thyroid disease.    ROS:   Please see the history of present illness.   Review of Systems  Cardiovascular: Positive for dyspnea on exertion.  All other systems reviewed and are negative.    PHYSICAL EXAM: VS:  BP 126/78 mmHg  Pulse 58  Ht 6' 2.5" (1.892 m)  Wt 205 lb 1.9 oz (93.042 kg)  BMI 25.99 kg/m2    Wt Readings from Last 3 Encounters:  07/02/15 205 lb 1.9 oz (93.042 kg)  06/20/15 216 lb (97.977 kg)  06/13/15 216 lb 12.8 oz (98.34 kg)     GEN: Well nourished, well developed, in no acute distress HEENT: normal Neck: no JVD,  no masses Cardiac:  Normal S1/S2, RRR; no murmur ,  no rubs or gallops, no bilateral LE edema   Respiratory:  Decreased breath sounds bilaterally, no wheezing, rhonchi or rales. GI: soft, nontender, nondistended, + BS MS: no deformity or atrophy Skin: warm and dry  Neuro:  CNs II-XII intact, Strength and sensation are intact Psych: Normal affect   EKG:  EKG is ordered today.  It demonstrates:   Sinus bradycardia, HR 58, LBBB   Recent Labs: 04/21/2015: TSH 0.38 06/09/2015: B Natriuretic Peptide 384.2* 06/13/2015: Hemoglobin 9.3*; Platelets 282 06/20/2015: ALT 18; Magnesium 2.1; Pro B Natriuretic peptide (BNP) 539.0* 06/27/2015: BUN 18; Creatinine, Ser 2.02*; Potassium 4.1; Sodium 149*     Lipid Panel    Component Value Date/Time   CHOL 269* 06/24/2014 1139   TRIG 133.0 06/24/2014 1139   TRIG 89 12/21/2006 1117   HDL 56.50 06/24/2014 1139   CHOLHDL 5 06/24/2014 1139   CHOLHDL 3.8 CALC 12/21/2006 1117   VLDL 26.6 06/24/2014 1139   LDLCALC 186* 06/24/2014 1139   LDLDIRECT 177.9 06/17/2010 0909  ASSESSMENT AND PLAN:  1.  Chronic combined systolic and diastolic CHF (congestive heart failure): He is NYHA 2b-3. Volume has improved.  Continue current Rx.  He has a poor appetite and low energy.  FU with CHF clinic later this month.  I would be interested to hear what Dr. Shirlee Latch has to say.  I'm not sure that he has low output.  I have asked him to add Glucerna as a meal supplement to help replace calories when he is not eating.   2.  NICM (nonischemic cardiomyopathy):  Continue beta blocker, hydralazine, nitrates. He is not on ACE inhibitor or ARB secondary to chronic kidney disease. 3.  PAF (paroxysmal atrial fibrillation):  Maintaining NSR. He is tolerating Coumadin. 4.  Coronary artery disease involving native coronary artery of native heart without angina pectoris:  No angina. He is not on aspirin as he is on Coumadin. Continue statin, beta blocker, nitrates. 5.  ICD (implantable cardioverter-defibrillator) in place:  Follow-up with Dr. Ladona Ridgel as planned. 6.  CKD (chronic kidney disease) stage 3, GFR 30-59 ml/min:  Recent creatinine stable. 7.  Anemia in chronic renal disease:  Recent hemoglobin stable/improved. 8.  Hypothyroidism, unspecified hypothyroidism type:  Recent TSH normal. 9.  Essential hypertension:  Controlled.   10.Hyperlipidemia:  Continue statin.   Medication Changes: Current medicines are reviewed at length with the patient today.  Concerns regarding medicines are as outlined above.  The following changes have been made:   Discontinued Medications   No medications on file   Modified Medications   No medications on file   New Prescriptions    No medications on file    Labs/ tests ordered today include:   Orders Placed This Encounter  Procedures  . EKG 12-Lead     Disposition:   FU with Dr. Marca Ancona in the HF Clinic later this month.  I will see him in 3 mos.     Signed, Brynda Rim, MHS 07/02/2015 5:32 PM    [facility] [address] [place], [place], [place]  [address] Phone: [phone]; Fax: [phone]

## 2015-07-02 NOTE — Patient Instructions (Signed)
Medication Instructions:  1. Your physician recommends that you continue on your current medications as directed. Please refer to the Current Medication list given to you today.   Labwork: NONE  Testing/Procedures: NONE  Follow-Up: 1. KEEP YOUR APPT WITH HEART FAILURE CLINIC THIS MONTH  2. SEE SCOTT WEAVER, PAC   Any Other Special Instructions Will Be Listed Below (If Applicable). MONITOR WEIGHT AND IF YOUR WEIGHT IS UP 2 LB'S IN 1 DAY OR 5 LB'S IN 1 WEEK OK TO TAKE EXTRA TORSEMIDE 20 MG AND CALL THE OFFICE TO LET [place], PAC KNOW [phone].

## 2015-07-14 ENCOUNTER — Encounter (HOSPITAL_COMMUNITY): Payer: Self-pay

## 2015-07-14 ENCOUNTER — Ambulatory Visit (HOSPITAL_COMMUNITY)
Admission: RE | Admit: 2015-07-14 | Discharge: 2015-07-14 | Disposition: A | Discharge status: Home / Self Care | Payer: Medicare Other | Source: Ambulatory Visit | Attending: Cardiology | Admitting: Cardiology

## 2015-07-14 ENCOUNTER — Ambulatory Visit (INDEPENDENT_AMBULATORY_CARE_PROVIDER_SITE_OTHER): Payer: Medicare Other | Admitting: Internal Medicine

## 2015-07-14 ENCOUNTER — Encounter: Payer: Self-pay | Admitting: Internal Medicine

## 2015-07-14 VITALS — BP 124/68 | HR 62 | Ht 74.5 in | Wt 213.4 lb

## 2015-07-14 VITALS — BP 110/64 | HR 62 | Wt 213.2 lb

## 2015-07-14 DIAGNOSIS — E785 Hyperlipidemia, unspecified: Secondary | ICD-10-CM | POA: Diagnosis not present

## 2015-07-14 DIAGNOSIS — Z95810 Presence of automatic (implantable) cardiac defibrillator: Secondary | ICD-10-CM

## 2015-07-14 DIAGNOSIS — E039 Hypothyroidism, unspecified: Secondary | ICD-10-CM | POA: Insufficient documentation

## 2015-07-14 DIAGNOSIS — I5043 Acute on chronic combined systolic (congestive) and diastolic (congestive) heart failure: Secondary | ICD-10-CM | POA: Diagnosis not present

## 2015-07-14 DIAGNOSIS — D631 Anemia in chronic kidney disease: Secondary | ICD-10-CM | POA: Insufficient documentation

## 2015-07-14 DIAGNOSIS — N183 Chronic kidney disease, stage 3 unspecified: Secondary | ICD-10-CM

## 2015-07-14 DIAGNOSIS — R5383 Other fatigue: Secondary | ICD-10-CM | POA: Diagnosis not present

## 2015-07-14 DIAGNOSIS — I639 Cerebral infarction, unspecified: Secondary | ICD-10-CM

## 2015-07-14 DIAGNOSIS — I480 Paroxysmal atrial fibrillation: Secondary | ICD-10-CM

## 2015-07-14 DIAGNOSIS — G4730 Sleep apnea, unspecified: Secondary | ICD-10-CM

## 2015-07-14 DIAGNOSIS — I429 Cardiomyopathy, unspecified: Secondary | ICD-10-CM

## 2015-07-14 DIAGNOSIS — I129 Hypertensive chronic kidney disease with stage 1 through stage 4 chronic kidney disease, or unspecified chronic kidney disease: Secondary | ICD-10-CM | POA: Insufficient documentation

## 2015-07-14 DIAGNOSIS — I5042 Chronic combined systolic (congestive) and diastolic (congestive) heart failure: Secondary | ICD-10-CM | POA: Diagnosis not present

## 2015-07-14 DIAGNOSIS — I428 Other cardiomyopathies: Secondary | ICD-10-CM

## 2015-07-14 DIAGNOSIS — I5022 Chronic systolic (congestive) heart failure: Secondary | ICD-10-CM

## 2015-07-14 LAB — CUP PACEART INCLINIC DEVICE CHECK
Battery Remaining Longevity: 126 mo
Battery Voltage: 3.09 V
Brady Statistic AP VP Percent: 0.01 %
Brady Statistic AP VS Percent: 5.39 %
Brady Statistic RV Percent Paced: 0.06 %
Date Time Interrogation Session: 20160725142934
HIGH POWER IMPEDANCE MEASURED VALUE: 65 Ohm
HighPow Impedance: 133 Ohm
Lead Channel Impedance Value: 4047 Ohm
Lead Channel Impedance Value: 4047 Ohm
Lead Channel Impedance Value: 418 Ohm
Lead Channel Pacing Threshold Amplitude: 0.5 V
Lead Channel Pacing Threshold Amplitude: 0.75 V
Lead Channel Pacing Threshold Pulse Width: 0.4 ms
Lead Channel Pacing Threshold Pulse Width: 0.4 ms
Lead Channel Setting Pacing Amplitude: 2.5 V
Lead Channel Setting Sensing Sensitivity: 0.3 mV
MDC IDC MSMT LEADCHNL LV IMPEDANCE VALUE: 4047 Ohm
MDC IDC MSMT LEADCHNL RA SENSING INTR AMPL: 4.5 mV
MDC IDC MSMT LEADCHNL RV IMPEDANCE VALUE: 361 Ohm
MDC IDC MSMT LEADCHNL RV SENSING INTR AMPL: 13.375 mV
MDC IDC SET LEADCHNL RA PACING AMPLITUDE: 2 V
MDC IDC SET LEADCHNL RV PACING PULSEWIDTH: 0.4 ms
MDC IDC SET ZONE DETECTION INTERVAL: 300 ms
MDC IDC SET ZONE DETECTION INTERVAL: 360 ms
MDC IDC SET ZONE DETECTION INTERVAL: 450 ms
MDC IDC STAT BRADY AS VP PERCENT: 0.05 %
MDC IDC STAT BRADY AS VS PERCENT: 94.55 %
MDC IDC STAT BRADY RA PERCENT PACED: 5.4 %
Zone Setting Detection Interval: 350 ms

## 2015-07-14 MED ORDER — CARVEDILOL 6.25 MG PO TABS: 6.2500 mg | ORAL_TABLET | Freq: Two times a day (BID) | ORAL | Status: DC

## 2015-07-14 MED ORDER — TORSEMIDE 20 MG PO TABS: ORAL_TABLET | ORAL | Status: DC

## 2015-07-14 MED ORDER — PANTOPRAZOLE SODIUM 40 MG PO TBEC: 40.0000 mg | DELAYED_RELEASE_TABLET | Freq: Every day | ORAL | Status: DC

## 2015-07-14 NOTE — Progress Notes (Signed)
Patient ID: OGDEN HANDLIN, male   DOB: 1941-08-16, 74 y.o.   MRN: [id number]    Cardiology Office Note   Date:  07/14/2015   ID:  Mcihael, Hinderman [birth year], MRN [id number]  PCP:  [name], MD  Cardiologist:  Dr. Rollene Rotunda   Electrophysiologist:  Dr. Lewayne Bunting  Primary HF: Dr Shirlee Latch   History of Present Illness: XZAVIAN SEMMEL is a 74 y.o. 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   He was seen in the ER on 06/09/15 with dyspnea and LE edema at which time Hgb was 8.6 (runs 8-10 range), Cr 1.85 (most recently 1.93), BNP 384. CXR had shown small left pleural effusion and COPD. He was noted to have wheezing and rhonchi. He was treated with IV Lasix and breathing treatment. Last echo 02/2015 EF 30-35%, grade 3 DD, mod MR, mod LAE, mod reduced RV function, severely dilated RA, PASP 54 mmHg. He was seen by Tereso Newcomer with further adjustments in his torsemide.   He reports increased dyspnea on exertion over the last few weeks.  Weight at home 203-213 pounds. Weight at home trending up, per family up 10 lbs on home scale. Poor appetite. Profound fatigue generally. Not very active.   Optivol was checked today: < 1 hr activity daily, fluid index < threshold with stable thoracic impedance.   Labs (7/15): LDL 186 Labs (2/16): K 4.9, creatinine 2.91 => 2.62, proBNP 432, HCT 34.2 Labs (7/16): creatinine 2.0, K 4.1  ECG (7/16): NSR, LBBB with QRS 170 msec  PMH: 1. CKD: Followed by Dr Lowell Guitar. 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 He is now off amiodarone.  3. 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 He had cardiac cath in 4/12 with mild  nonobstructive disease. Echo (6/14) with EF 25-30%, prominent apical trabeculations, diffuse hypokinesis, mild MR. Lexiscan Cardiolite in 1/16 showed a large, severe primarily fixed inferior and apical perfusion defect with EF 25%, suggestive of prior infarct with mild peri-infarct ischemia.  RHC (2/16) with mean RA 6, PA 62/32 mean 46, PCWP 30, CI 1.81.  Echo (3/16) with EF 30-35%, diffuse hypokinesis, mildly dilated RV with moderately decreased systolic function, moderate MR, PASP 54 mmHg. Medtronic ICD (3/16), unable to place LV lead.  4. CVA: 2004.  5. HTN 6. Gout 7. Hyperlipidemia 8. BPH 9. H/o LBBB 10. Atrial fibrillation: Paroxysmal. Developed hypothyroidism on amiodarone and stopped it.  11. PAD: Has been followed by [facility] (Dr Darrick Penna). History of foot ulcers, now healed.  12. Anemia: Suspect related to CKD.   SH: Married, lives in [place], never smoked, no ETOH.   FH: Father lived into his 31s, no significant cardiac disease.   ROS: All systems reviewed and negative except as per HPI.   Current Outpatient Prescriptions  Medication Sig Dispense Refill  . acetaminophen (TYLENOL) 500 MG tablet Take 500 mg by mouth every 6 (six) hours as needed.    [name] amLODipine (NORVASC) 10 MG tablet TAKE 1 TABLET BY MOUTH EVERY DAY 30 tablet 6  . atorvastatin (LIPITOR) 20 MG tablet Take 1 tablet (20 mg total) by mouth daily. 90 tablet 3  . carvedilol (COREG) 12.5 MG tablet Take 1 tablet (12.5 mg total) by mouth  2 (two) times daily with a meal. 60 tablet 11  . cholecalciferol (VITAMIN D) 1000 UNITS tablet Take 1 tablet (1,000 Units total) by mouth daily. 30 tablet 1  . CVS VITAMIN B12 1000 MCG tablet TAKE 1 TABLET BY MOUTH DAILY 100 tablet 1  . escitalopram (LEXAPRO) 5 MG tablet Take 1 tablet (5 mg total) by mouth daily. 30 tablet 5  . ferrous sulfate 325 (65 FE) MG tablet Take 1 tablet (325 mg total) by mouth daily. 30 tablet 6  . glipiZIDE (GLUCOTROL) 5 MG tablet TAKE 1/2 TABLET BY MOUTH TWICE  A DAY BEFORE A MEAL 30 tablet 11  . hydrALAZINE (APRESOLINE) 100 MG tablet Take 1 tablet (100 mg total) by mouth 3 (three) times daily. 90 tablet 11  . isosorbide mononitrate (IMDUR) 60 MG 24 hr tablet Take 1.5 tablets for 90 mg by mouth twice a day. 45 tablet 11  . Multiple Vitamin (MULTIVITAMIN WITH MINERALS) TABS tablet Take 1 tablet by mouth daily.    . OXYGEN Inhale 2.5 L into the lungs See admin instructions. USES OXYGEN EVERY BEDTIME USES DURING DAY ONLY AS NEEDED    . polyethylene glycol (MIRALAX / GLYCOLAX) packet Take 17 g by mouth daily. 14 each 0  . potassium chloride SA (K-DUR,KLOR-CON) 20 MEQ tablet Take 1.5 tablets (30 mEq total) by mouth 3 (three) times daily.    . SYMBICORT 160-4.5 MCG/ACT inhaler INHALE 2 PUFFS INTO THE LUNGS 2 TIMES DAILY 10.2 Inhaler 5  . SYNTHROID 100 MCG tablet TAKE 2 TABLETS BY MOUTH EVERY MORNING BEFORE BREAKFAST 60 tablet 11  . torsemide (DEMADEX) 20 MG tablet Take 1 tablet (20 mg total) by mouth daily.    [name] warfarin (COUMADIN) 5 MG tablet 5 mg tablet daily except one half tablet on Mondays and Fridays 30 tablet 1  . HYDROcodone-acetaminophen (NORCO/VICODIN) 5-325 MG per tablet Take 1 tablet by mouth every 6 (six) hours as needed for moderate pain. (Patient not taking: Reported on 07/14/2015) 60 tablet 0  . pantoprazole (PROTONIX) 40 MG tablet Take 1 tablet (40 mg total) by mouth 2 (two) times daily. (Patient not taking: Reported on 07/14/2015) 60 tablet 1   No current facility-administered medications for this encounter.   ROS:   Please see the history of present illness.   Review of Systems  Cardiovascular: Positive for dyspnea on exertion.  All other systems reviewed and are negative.    PHYSICAL EXAM: VS:  BP 110/64 mmHg  Pulse 62  Wt 213 lb 4 oz (96.73 kg)  SpO2 95%    Wt Readings from Last 3 Encounters:  07/14/15 213 lb 4 oz (96.73 kg)  07/02/15 205 lb 1.9 oz (93.042 kg)  06/20/15 216 lb (97.977 kg)     GEN: Well nourished, well  developed, in no acute distress. Wife and brother in law present.  HEENT: normal  Neck: JVP 8 cm,  no masses Cardiac:  Normal S1/S2, RRR; no murmur ,  no rubs or gallops, no bilateral LE edema   Respiratory:  Decreased breath sounds bilaterally, no wheezing, rhonchi or rales. GI: soft, nontender, nondistended, + BS MS: no deformity or atrophy Skin: warm and dry  Neuro:  CNs II-XII intact, Strength and sensation are intact Psych: Flat affect, not very interactive.    ASSESSMENT AND PLAN: 1.  Chronic combined systolic and diastolic CHF: NICM. Medtronic ICD but unable to place LV lead.  ECHO 02/2015 30-35%.  Optivol ~ 1 hour of activity. Fluid below threshold. No VT/AT. NYHA  IIIB symptoms. He does not look significantly volume overloaded (by exam or by Optivol), but weight is up and he remains quite symptomatic. Low output on 2/16 RHC.  - Today will increase torsemide to 40 mg daily and alternate with 20 mg daily. BMET in 2 wks. - Cut back carvedilol 6.26 mg twice a day given concern for low output symptoms.  Not good digoxin candidate with CKD.  - Continue current hydralazine/Imdur.  - If creatinine remains stable, will add spironolactone next appointment.  - Very wide LBBB, unable to place transvenous LV lead.  Not a lot of good options available to help make him feel better, will have him see Dr Laneta Simmers for epicardial LV lead evaluation.  - Not a good LVAD candidate with CKD. Would hold off on home milrinone for now.   2.  PAF:  Maintaining NSR. He is tolerating Coumadin. 3.  CKD: Creatinine baseline 1.8-2.1.  Has not seen Dr Lowell Guitar in the last few months.  4.  Anemia in chronic renal disease:  Recent hemoglobin stable/improved. 5.  Hypothyroidism:  Recent TSH normal. 6.  Essential hypertension:  Controlled.  As above, will be decreasing Coreg so will need to follow BP.  7. Hyperlipidemia:  Continue statin. 8. Day time fatigue:  Refer to pulmonary for sleep study.  Follow up in 6 weeks.    CLEGG,AMY NP-C  12:06 PM   Patient seen with NP, agree with the above note.  Patient remains fatigued and short of breath with exertion.  Creatinine stable at 2.  He is not markedly volume overloaded on exam or Optivol but weight is up at home.   - I will try increasing torsemide to 40 daily alternating with 20 daily.  - Cut back Coreg to 6.25 mg bid with concern for low output.  - Not a lot of good options to help him feel better => as above, will have him evaluated for LV epicardial lead.  - needs sleep study.   Neldon Shepard 07/14/2015 11:11 PM

## 2015-07-14 NOTE — Assessment & Plan Note (Signed)
His symptoms are well controlled. He will continue his current meds. He is encouraged to maintain a low sodium diet. He is pending a consult from Dr. Laneta Simmers to consider epicardial lead placement.

## 2015-07-14 NOTE — Progress Notes (Signed)
CHF Vest 37

## 2015-07-14 NOTE — Assessment & Plan Note (Signed)
He is maintaining NSR. He had previously been on amiodarone.

## 2015-07-14 NOTE — Patient Instructions (Addendum)
Take Torsemide 40mg  (2 tablets) once daily alternating with 20mg  (1 tablet) the next day, continuously.  DECREASE Carvedilol to 6.25 mg twice daily.  Will refer you to [facility] to evaluate for sleep apnea. [address], [place] [address] Phone: [phone]  Will refer you to Dr. Laneta Simmers to evaluate for placement of Left Ventricular lead. [address], [place], [place] [address]  Phone: [phone]  Return in 2 weeks for lab work.  Follow up 1 month with Dr. Shirlee Latch for routine appointment.  Do the following things EVERYDAY: 1) Weigh yourself in the morning before breakfast. Write it down and keep it in a log. 2) Take your medicines as prescribed 3) Eat low salt foods-Limit salt (sodium) to 2000 mg per day.  4) Stay as active as you can everyday 5) Limit all fluids for the day to less than 2 liters

## 2015-07-14 NOTE — Progress Notes (Signed)
HPI Frank Mclean returns today for followup. He is a pleasant 74 yo man with chronic systolic heart failure, a non-ischemic cm, LBBB, EF 20% who underwent ICD implant several months ago. At the time of his surgery, CS venography demonstrated a very poor anatomy for CS pacing. A BiV device which would accomodate a Bipolar LV lead was placed. In the interim he has been stable. He still has chronic class 3 heart failure. No ICD shocks.  Allergies  Allergen Reactions  . Levothyroxine Sodium Other (See Comments)    MUST TAKE BRAND NAME GENERIC DOESN'T WORK FOR PATIENT  . Chicken Protein Nausea Only and Other (See Comments)    Does not like chicken or [place]  . Eggs Or Egg-Derived Products Nausea Only    Does not like eggs     Current Outpatient Prescriptions  Medication Sig Dispense Refill  . acetaminophen (TYLENOL) 500 MG tablet Take 500 mg by mouth every 6 (six) hours as needed.    [name] amLODipine (NORVASC) 10 MG tablet TAKE 1 TABLET BY MOUTH EVERY DAY 30 tablet 6  . atorvastatin (LIPITOR) 20 MG tablet Take 1 tablet (20 mg total) by mouth daily. 90 tablet 3  . carvedilol (COREG) 6.25 MG tablet Take 1 tablet (6.25 mg total) by mouth 2 (two) times daily with a meal. 60 tablet 6  . cholecalciferol (VITAMIN D) 1000 UNITS tablet Take 1 tablet (1,000 Units total) by mouth daily. 30 tablet 1  . [facility]S VITAMIN B12 1000 MCG tablet TAKE 1 TABLET BY MOUTH DAILY 100 tablet 1  . escitalopram (LEXAPRO) 5 MG tablet Take 1 tablet (5 mg total) by mouth daily. 30 tablet 5  . ferrous sulfate 325 (65 FE) MG tablet Take 1 tablet (325 mg total) by mouth daily. 30 tablet 6  . glipiZIDE (GLUCOTROL) 5 MG tablet TAKE 1/2 TABLET BY MOUTH TWICE A DAY BEFORE A MEAL 30 tablet 11  . hydrALAZINE (APRESOLINE) 100 MG tablet Take 1 tablet (100 mg total) by mouth 3 (three) times daily. 90 tablet 11  . HYDROcodone-acetaminophen (NORCO/VICODIN) 5-325 MG per tablet Take 1 tablet by mouth every 6 (six) hours as needed for moderate  pain. 60 tablet 0  . isosorbide mononitrate (IMDUR) 60 MG 24 hr tablet Take 1.5 tablets for 90 mg by mouth twice a day. 45 tablet 11  . Multiple Vitamin (MULTIVITAMIN WITH MINERALS) TABS tablet Take 1 tablet by mouth daily.    . OXYGEN Inhale 2.5 L into the lungs See admin instructions. USES OXYGEN EVERY BEDTIME USES DURING DAY ONLY AS NEEDED    . pantoprazole (PROTONIX) 40 MG tablet Take 1 tablet (40 mg total) by mouth daily. 90 tablet 3  . polyethylene glycol (MIRALAX / GLYCOLAX) packet Take 17 g by mouth daily. 14 each 0  . potassium chloride SA (K-DUR,KLOR-CON) 20 MEQ tablet Take 1.5 tablets (30 mEq total) by mouth 3 (three) times daily.    . SYMBICORT 160-4.5 MCG/ACT inhaler INHALE 2 PUFFS INTO THE LUNGS 2 TIMES DAILY 10.2 Inhaler 5  . SYNTHROID 100 MCG tablet TAKE 2 TABLETS BY MOUTH EVERY MORNING BEFORE BREAKFAST 60 tablet 11  . torsemide (DEMADEX) 20 MG tablet Take  (2 tablets) once daily alternating with  (1 tablet) the next day, continuously. 75 tablet 6  . warfarin (COUMADIN) 5 MG tablet 5 mg tablet daily except one half tablet on Mondays and Fridays 30 tablet 1   No current facility-administered medications for this visit.     Past  Medical History  Diagnosis Date  . Gout   . HTN (hypertension)   . Hyperlipidemia   . Osteoarthritis   . History of CVA (cerebrovascular accident)     Left pontine infarct July 2004; changed from Plavix to Coumadin in 2004 per MD at [facility]  . GERD (gastroesophageal reflux disease)   . DJD (degenerative joint disease)   . BPH (benign prostatic hypertrophy)   . Peripheral vascular disease   . Bilateral leg ulcer     ACHILLES AREA--  NONHEALING  . CKD (chronic kidney disease) stage 3, GFR 30-59 ml/min   . CAD (coronary artery disease)     a. LHC 4/12: Mid LAD 25%, mid diagonal 30%, AV circumflex 40%, proximal OM 25%, distal RCA 40%  . Chronic combined systolic and diastolic heart failure     a. Echo 05/2013: EF 25-30%, diffuse HK,  restrictive physiology, trivial AI, mild MR, moderate LAE, reduced RV systolic function, PASP 42  . LBBB (left bundle branch block)   . PAF (paroxysmal atrial fibrillation)     a. amiodarone Rx started 05/2013;  b. chronic coumadin  . NICM (nonischemic cardiomyopathy)     a. EF 25-30%.  [name] Hx of cardiovascular stress test     Nuclear Stress Test (1/16): High risk stress nuclear study with a large, severe, partially reversible inferior and apical defect consistent with prior inferior and apical infarct; mild apical ischemia; severe LVE; study high risk due to reduced LV function.  EF 25% >> reviewed with Dr. Antoine Poche >> medical mgmt  . Hypothyroidism   . DM (diabetes mellitus), type 2     ROS:   All systems reviewed and negative except as noted in the HPI.   Past Surgical History  Procedure Laterality Date  . Cardiac catheterization  04-16-2011   DR [facility]    NON-OBSTRUCTIVE CAD. MILDLY ELEVATED PULMONARY PRESSURES/ ELEVATED  END-DIASTOLIC PRESSURE  . Transthoracic echocardiogram  12-31-2010    MODERATE CONCENTRIC LVH/ SYSTOLIC FUNCTION SEVERELY REDUCED/ EF 25-30%/  SEVERE HYPOKINESIS OF ANTEROSEPTAL MYOCARDIUM  AND ENTIREAPICAL MYOCARDIUM /  MODERATE HYPOKINESIS OF LATERAL, INFEROLATERAL, INFERIOR,AND INFEROSEPTAL MYOCARIUM/  GRADE 3 DIASTOLIC DYSFUNCTION/ MILD MR  . Aortogram w/ bilateral lower extremitiy runoff  05-18-2013  DR FIELDS    LEFT LEG OCCLUDED PERONEAL AND ANTERIOR TIBIAL ARTERIES/ HIGH GRADE STENOIS 80% MIDDLE AND DISTAL THIRD OF POSTERIOR TIBIAL ARTERY/ RIGHT PERONEAL AND ANTERIOR TIBIAL ARTERY OCCLUDED/ 40% STENOSIS DISTALLY   . Abdominal aortagram N/A 05/18/2013    Procedure: ABDOMINAL Ronny Flurry;  Surgeon: Sherren Kerns, MD;  Location: [facility] CATH LAB;  Service: Cardiovascular;  Laterality: N/A;  . Lower extremity angiogram Bilateral 05/18/2013    Procedure: LOWER EXTREMITY ANGIOGRAM;  Surgeon: Sherren Kerns, MD;  Location: [facility] CATH LAB;  Service: Cardiovascular;   Laterality: Bilateral;  . Right heart catheterization N/A 02/13/2015    Procedure: RIGHT HEART CATH;  Surgeon: Laurey Morale, MD;  Location: [facility] CATH LAB;  Service: Cardiovascular;  Laterality: N/A;  . Bi-ventricular implantable cardioverter defibrillator N/A 03/12/2015    Procedure: BI-VENTRICULAR IMPLANTABLE CARDIOVERTER DEFIBRILLATOR  (CRT-D);  Surgeon: Marinus Maw, MD;  Location: [facility] CATH LAB;  Service: Cardiovascular;  Laterality: N/A;     Family History  Problem Relation Age of Onset  . Hypertension Mother   . Heart disease Mother   . Diabetes Mother   . Gout Other   . Stroke Other   . Hypertension Father   . Heart disease Father   . Heart attack Maternal Grandmother   .  Thyroid disease Neg Hx      History   Social History  . Marital Status: Married    Spouse Name: N/A  . Number of Children: N/A  . Years of Education: N/A   Occupational History  . Retired - Working in Product manager    Social History Main Topics  . Smoking status: Former Smoker    Quit date: 04/12/1974  . Smokeless tobacco: Never Used  . Alcohol Use: No     Comment: previously drank - "love cognac" quit 15 yrs ago.  . Drug Use: No  . Sexual Activity: No   Other Topics Concern  . Not on file   Social History Narrative   Worked at [employer] year.  He is married and lives with his wife Frank Mclean in [place].  Has one daughter Frank Mclean     BP 124/68 mmHg  Pulse 62  Ht 6' 2.5" (1.892 m)  Wt 213 lb 6.4 oz (96.798 kg)  BMI 27.04 kg/m2  SpO2 97%  Physical Exam:  Stable appearing 74 yo man, NAD HEENT: Unremarkable Neck:  No JVD, no thyromegally Back:  No CVA tenderness Lungs:  Clear with no wheezes HEART:  Regular rate rhythm, no murmurs, no rubs, no clicks Abd:  soft, positive bowel sounds, no organomegally, no rebound, no guarding Ext:  2 plus pulses, no edema, no cyanosis, no clubbing Skin:  No rashes no nodules Neuro:  CN II through XII intact, motor grossly intact   DEVICE    Normal device function.  See PaceArt for details.   Assess/Plan:

## 2015-07-14 NOTE — Assessment & Plan Note (Signed)
His medtronic device is working normally. His LV port is capped.

## 2015-07-14 NOTE — Patient Instructions (Signed)
Medication Instructions:  Your physician has recommended you make the following change in your medication:  1) Take Protonix one 40 mg tablet by mouth ONCE daily   Labwork: NONE  Testing/Procedures: NONE  Follow-Up: Remote monitoring is used to monitor your Pacemaker or ICD from home. This monitoring reduces the number of office visits required to check your device to one time per year. It allows [place] to keep an eye on the functioning of your device to ensure it is working properly. You are scheduled for a device check from home on 10/13/2015. You may send your transmission at any time that day. If you have a wireless device, the transmission will be sent automatically. After your physician reviews your transmission, you will receive a postcard with your next transmission date.  Your physician wants you to follow-up in: 9 months with Dr. Ladona Ridgel. You will receive a reminder letter in the mail two months in advance. If you don't receive a letter, please call our office to schedule the follow-up appointment.   Any Other Special Instructions Will Be Listed Below (If Applicable).

## 2015-07-15 ENCOUNTER — Encounter: Payer: Medicare Other | Admitting: Internal Medicine

## 2015-07-23 ENCOUNTER — Encounter: Payer: Medicare Other | Admitting: Surgery

## 2015-07-25 ENCOUNTER — Encounter: Payer: Self-pay | Admitting: Surgery

## 2015-07-25 ENCOUNTER — Institutional Professional Consult (permissible substitution) (INDEPENDENT_AMBULATORY_CARE_PROVIDER_SITE_OTHER): Payer: Medicare Other | Admitting: Surgery

## 2015-07-25 VITALS — BP 119/75 | HR 65 | Resp 16 | Ht 74.5 in | Wt 209.0 lb

## 2015-07-25 DIAGNOSIS — I5042 Chronic combined systolic (congestive) and diastolic (congestive) heart failure: Secondary | ICD-10-CM

## 2015-07-28 ENCOUNTER — Other Ambulatory Visit: Payer: Self-pay | Admitting: *Deleted

## 2015-07-28 DIAGNOSIS — I429 Cardiomyopathy, unspecified: Secondary | ICD-10-CM

## 2015-07-30 ENCOUNTER — Encounter: Payer: Self-pay | Admitting: Surgery

## 2015-07-30 NOTE — Progress Notes (Signed)
Cardiothoracic Surgery Consultation   PCP is [name], MD Referring Provider is Laurey Morale, MD  Chief Complaint  Patient presents with  . 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 He is followed by Dr. Shirlee Latch in the heart failure clinic and underwent ICD several months ago. A BiV device was implanted but his CS anatomy was not adequate for placement of a CS lead. He has been stable with class III heart failure with shortness of breath with mild exertion and sometimes at rest. He has had decreased appetite and fatigue and he doesn't really feel like doing anything. He was in the ER in June with shortness of breath and increasing lower extremity edema which responded to diuretic.   Past Medical History  Diagnosis Date  . Gout   . HTN (hypertension)   . Hyperlipidemia   . Osteoarthritis   . History of CVA (cerebrovascular accident)     Left pontine infarct July 2004; changed from Plavix to Coumadin in 2004 per MD at [facility]  . GERD (gastroesophageal reflux disease)   . DJD (degenerative joint disease)   . BPH (benign prostatic hypertrophy)   . Peripheral vascular disease   . Bilateral leg ulcer     ACHILLES AREA--  NONHEALING  . CKD (chronic kidney disease) stage 3, GFR 30-59 ml/min   . CAD (coronary artery disease)     a. LHC 4/12: Mid LAD 25%, mid diagonal 30%, AV circumflex 40%, proximal OM 25%, distal RCA 40%  . Chronic combined systolic and diastolic heart failure     a. Echo 05/2013: EF 25-30%, diffuse HK, restrictive physiology, trivial AI, mild MR, moderate LAE, reduced RV systolic function, PASP 42  . LBBB (left bundle branch block)   . PAF (paroxysmal atrial fibrillation)     a. amiodarone Rx started 05/2013;  b. chronic coumadin  . NICM (nonischemic cardiomyopathy)     a. EF  25-30%.  [name] Hx of cardiovascular stress test     Nuclear Stress Test (1/16): High risk stress nuclear study with a large, severe, partially reversible inferior and apical defect consistent with prior inferior and apical infarct; mild apical ischemia; severe LVE; study high risk due to reduced LV function.  EF 25% >> reviewed with Dr. Antoine Poche >> medical mgmt  . Hypothyroidism   . DM (diabetes mellitus), type 2     Past Surgical History  Procedure Laterality Date  . Cardiac catheterization  04-16-2011   DR [facility]    NON-OBSTRUCTIVE CAD. MILDLY ELEVATED PULMONARY PRESSURES/ ELEVATED  END-DIASTOLIC PRESSURE  . Transthoracic echocardiogram  12-31-2010    MODERATE CONCENTRIC LVH/ SYSTOLIC FUNCTION SEVERELY REDUCED/ EF 25-30%/  SEVERE HYPOKINESIS OF ANTEROSEPTAL MYOCARDIUM  AND ENTIREAPICAL MYOCARDIUM /  MODERATE HYPOKINESIS OF LATERAL, INFEROLATERAL, INFERIOR,AND INFEROSEPTAL MYOCARIUM/  GRADE 3 DIASTOLIC DYSFUNCTION/ MILD MR  . Aortogram w/ bilateral lower extremitiy runoff  05-18-2013  DR FIELDS    LEFT LEG OCCLUDED PERONEAL AND ANTERIOR TIBIAL ARTERIES/ HIGH GRADE STENOIS 80% MIDDLE AND DISTAL THIRD OF POSTERIOR TIBIAL ARTERY/ RIGHT PERONEAL AND ANTERIOR TIBIAL ARTERY OCCLUDED/ 40% STENOSIS DISTALLY   . Abdominal aortagram N/A 05/18/2013    Procedure: ABDOMINAL Ronny Flurry;  Surgeon: Sherren Kerns, MD;  Location: [facility] CATH LAB;  Service: Cardiovascular;  Laterality: N/A;  . Lower extremity angiogram Bilateral  05/18/2013    Procedure: LOWER EXTREMITY ANGIOGRAM;  Surgeon: Sherren Kerns, MD;  Location: [facility] CATH LAB;  Service: Cardiovascular;  Laterality: Bilateral;  . Right heart catheterization N/A 02/13/2015    Procedure: RIGHT HEART CATH;  Surgeon: Laurey Morale, MD;  Location: [facility] CATH LAB;  Service: Cardiovascular;  Laterality: N/A;  . Bi-ventricular implantable cardioverter defibrillator N/A 03/12/2015    Procedure: BI-VENTRICULAR IMPLANTABLE CARDIOVERTER DEFIBRILLATOR  (CRT-D);  Surgeon:  Marinus Maw, MD;  Location: [facility] CATH LAB;  Service: Cardiovascular;  Laterality: N/A;    Family History  Problem Relation Age of Onset  . Hypertension Mother   . Heart disease Mother   . Diabetes Mother   . Gout Other   . Stroke Other   . Hypertension Father   . Heart disease Father   . Heart attack Maternal Grandmother   . Thyroid disease Neg Hx     Social History Social History  Substance Use Topics  . Smoking status: Former Smoker    Quit date: 04/12/1974  . Smokeless tobacco: Never Used  . Alcohol Use: No     Comment: previously drank - "love cognac" quit 15 yrs ago.    Current Outpatient Prescriptions  Medication Sig Dispense Refill  . acetaminophen (TYLENOL) 500 MG tablet Take 500 mg by mouth every 6 (six) hours as needed.    [name] amLODipine (NORVASC) 10 MG tablet TAKE 1 TABLET BY MOUTH EVERY DAY 30 tablet 6  . atorvastatin (LIPITOR) 20 MG tablet Take 1 tablet (20 mg total) by mouth daily. 90 tablet 3  . carvedilol (COREG) 6.25 MG tablet Take 1 tablet (6.25 mg total) by mouth 2 (two) times daily with a meal. 60 tablet 6  . cholecalciferol (VITAMIN D) 1000 UNITS tablet Take 1 tablet (1,000 Units total) by mouth daily. 30 tablet 1  . CVS VITAMIN B12 1000 MCG tablet TAKE 1 TABLET BY MOUTH DAILY 100 tablet 1  . escitalopram (LEXAPRO) 5 MG tablet Take 1 tablet (5 mg total) by mouth daily. 30 tablet 5  . ferrous sulfate 325 (65 FE) MG tablet Take 1 tablet (325 mg total) by mouth daily. 30 tablet 6  . glipiZIDE (GLUCOTROL) 5 MG tablet TAKE 1/2 TABLET BY MOUTH TWICE A DAY BEFORE A MEAL 30 tablet 11  . hydrALAZINE (APRESOLINE) 100 MG tablet Take 1 tablet (100 mg total) by mouth 3 (three) times daily. 90 tablet 11  . HYDROcodone-acetaminophen (NORCO/VICODIN) 5-325 MG per tablet Take 1 tablet by mouth every 6 (six) hours as needed for moderate pain. 60 tablet 0  . isosorbide mononitrate (IMDUR) 60 MG 24 hr tablet Take 1.5 tablets for 90 mg by mouth twice a day. 45 tablet 11  .  OXYGEN Inhale 2.5 L into the lungs See admin instructions. USES OXYGEN EVERY BEDTIME USES DURING DAY ONLY AS NEEDED    . pantoprazole (PROTONIX) 40 MG tablet Take 1 tablet (40 mg total) by mouth daily. 90 tablet 3  . polyethylene glycol (MIRALAX / GLYCOLAX) packet Take 17 g by mouth daily. 14 each 0  . potassium chloride SA (K-DUR,KLOR-CON) 20 MEQ tablet Take 1.5 tablets (30 mEq total) by mouth 3 (three) times daily.    . SYMBICORT 160-4.5 MCG/ACT inhaler INHALE 2 PUFFS INTO THE LUNGS 2 TIMES DAILY 10.2 Inhaler 5  . SYNTHROID 100 MCG tablet TAKE 2 TABLETS BY MOUTH EVERY MORNING BEFORE BREAKFAST 60 tablet 11  . torsemide (DEMADEX) 20 MG tablet Take 40mg  (2 tablets) once daily alternating with 20mg  (1  tablet) the next day, continuously. 75 tablet 6  . warfarin (COUMADIN) 5 MG tablet 5 mg tablet daily except one half tablet on Mondays and Fridays 30 tablet 1   No current facility-administered medications for this visit.    Allergies  Allergen Reactions  . Levothyroxine Sodium Other (See Comments)    MUST TAKE BRAND NAME GENERIC DOESN'T WORK FOR PATIENT  . Chicken Protein Nausea Only and Other (See Comments)    Does not like chicken or [place]  . Eggs Or Egg-Derived Products Nausea Only    Does not like eggs    Review of Systems  Constitutional: Positive for activity change, appetite change, fatigue and unexpected weight change.  HENT:       Dentures  Eyes: Negative.   Respiratory: Positive for wheezing.        Uses oxygen as needed  Cardiovascular: Positive for leg swelling. Negative for chest pain and palpitations.       Shortness of breath with exertion and at rest  Gastrointestinal: Negative.   Genitourinary: Negative.   Musculoskeletal: Positive for joint swelling, arthralgias and gait problem.  Skin: Negative.   Allergic/Immunologic: Negative.   Neurological:       History of stroke with slurred speech  Hematological: Negative.   Psychiatric/Behavioral: Negative.     BP  119/75 mmHg  Pulse 65  Resp 16  Ht 6' 2.5" (1.892 m)  Wt 209 lb (94.802 kg)  BMI 26.48 kg/m2  SpO2 91% Physical Exam  Constitutional: He is oriented to person, place, and time. He appears well-developed and well-nourished. No distress.  HENT:  Head: Normocephalic and atraumatic.  Mouth/Throat: Oropharynx is clear and moist.  Eyes: EOM are normal. Pupils are equal, round, and reactive to light.  Neck: Normal range of motion. Neck supple. No thyromegaly present.  Cardiovascular: Normal rate, regular rhythm, normal heart sounds and intact distal pulses.   No murmur heard. Pulmonary/Chest: Effort normal and breath sounds normal. No respiratory distress. He has no wheezes. He has no rales.  ICD incision well-healed  Abdominal: Soft. Bowel sounds are normal. He exhibits no distension and no mass. There is no tenderness.  Musculoskeletal: He exhibits no edema.  Lymphadenopathy:    He has no cervical adenopathy.  Neurological: He is alert and oriented to person, place, and time. He has normal strength. No cranial nerve deficit or sensory deficit.  Skin: Skin is warm and dry.  Psychiatric: He has a normal mood and affect.     Diagnostic Tests:        *[place]*         *[facility]*            [address]            [place], [place] [address]              [phone]  ------------------------------------------------------------------- Echocardiography  Patient:  Frank Mclean, Frank Mclean MR #:    [id number] Study Date: 02/26/2015 Gender:   M Age:    73 Height:   188 cm Weight:   97.1 kg BSA:    2.27 m^2 Pt. Status: Room:  Nicole Cella, M.D. REFERRING  Marca Ancona, M.D. SONOGRAPHER Nolon Rod, RDCS REFERRING  Plotnikov, Alex ATTENDING  Bensimhon, Reuel Boom PERFORMING  [facility],  Outpatient  cc:  ------------------------------------------------------------------- LV EF: 30% -  35%  ------------------------------------------------------------------- Indications:   CHF - 428.0.  ------------------------------------------------------------------- History:  PMH:  Congestive heart failure. Risk factors: Hypertension. Diabetes mellitus.  -------------------------------------------------------------------  Study Conclusions  - Left ventricle: Wall thickness was increased in a pattern of mild LVH. Systolic function was moderately to severely reduced. The estimated ejection fraction was in the range of 30% to 35%. Doppler parameters are consistent with a reversible restrictive pattern, indicative of decreased left ventricular diastolic compliance and/or increased left atrial pressure (grade 3 diastolic dysfunction). - Aortic valve: There was trivial regurgitation. - Mitral valve: There was moderate regurgitation. - Left atrium: The atrium was moderately dilated. - Right ventricle: The cavity size was moderately dilated. Systolic function was moderately reduced. - Right atrium: The atrium was severely dilated. - Pulmonary arteries: Systolic pressure was moderately increased. PA peak pressure: 54 mm Hg (S).  Echocardiography. M-mode, complete 2D, spectral Doppler, and color Doppler. Birthdate: Patient birthdate: 1941/04/05. Age: Patient is 74 yr old. Sex: Gender: male.  BMI: 27.5 kg/m^2. Blood pressure:   126/88 Patient status: Outpatient. Study date: Study date: 02/26/2015. Study time: 10:56 AM. Location: Echo laboratory.  -------------------------------------------------------------------  ------------------------------------------------------------------- Left ventricle: The global longgitudinal strain is -12. 7 Wall thickness was increased in a pattern of mild LVH.  Systolic function was moderately to severely  reduced. The estimated ejection fraction was in the range of 30% to 35%. Doppler parameters are consistent with a reversible restrictive pattern, indicative of decreased left ventricular diastolic compliance and/or increased left atrial pressure (grade 3 diastolic dysfunction).  ------------------------------------------------------------------- Aortic valve:  Structurally normal valve.  Cusp separation was normal. Doppler: Transvalvular velocity was within the normal range. There was no stenosis. There was trivial regurgitation.  ------------------------------------------------------------------- Aorta: Aortic root: The aortic root was normal in size. Ascending aorta: The ascending aorta was normal in size.  ------------------------------------------------------------------- Mitral valve:  Structurally normal valve.  Leaflet separation was normal. Doppler: Transvalvular velocity was within the normal range. There was no evidence for stenosis. There was moderate regurgitation.  Peak gradient (D): 6 mm Hg.  ------------------------------------------------------------------- Left atrium: The atrium was moderately dilated.  ------------------------------------------------------------------- Right ventricle: The cavity size was moderately dilated. Systolic function was moderately reduced.  ------------------------------------------------------------------- Pulmonic valve:  The valve appears to be grossly normal. Doppler: There was no significant regurgitation.  ------------------------------------------------------------------- Tricuspid valve:  Structurally normal valve.  Leaflet separation was normal. Doppler: Transvalvular velocity was within the normal range. There was trivial regurgitation.  ------------------------------------------------------------------- Pulmonary artery:  Systolic pressure was moderately  increased.  ------------------------------------------------------------------- Right atrium: The atrium was severely dilated.  ------------------------------------------------------------------- Pericardium: There was no pericardial effusion.  ------------------------------------------------------------------- Systemic veins: Inferior vena cava: The vessel was normal in size. The respirophasic diameter changes were in the normal range (>= 50%), consistent with normal central venous pressure.  ------------------------------------------------------------------- Measurements  Left ventricle             Value    Reference LV ID, ED, PLAX chordal    (H)   59.1 mm   43 - 52 LV ID, ES, PLAX chordal    (H)   54  mm   23 - 38 LV fx shortening, PLAX chordal (L)   9   %   >=29 LV PW thickness, ED          12.8 mm   --------- IVS/LV PW ratio, ED          0.91     <=1.3 LV e&', lateral             5.22 cm/s  --------- LV E/e&', lateral            22.99    --------- LV e&',  medial             3.05 cm/s  --------- LV E/e&', medial            39.34    --------- LV e&', average             4.14 cm/s  --------- LV E/e&', average            29.02    ---------  Ventricular septum           Value    Reference IVS thickness, ED           11.6 mm   ---------  LVOT                  Value    Reference LVOT ID, S               22  mm   --------- LVOT area               3.8  cm^2  ---------  Aorta                 Value    Reference Aortic root ID, ED           34  mm   ---------  Left atrium              Value    Reference LA ID, A-P, ES             56  mm    --------- LA ID/bsa, A-P         (H)   2.47 cm/m^2 <=2.2 LA volume, ES, 1-p A4C         119  ml   --------- LA volume/bsa, ES, 1-p A4C       52.5 ml/m^2 --------- LA volume, ES, 1-p A2C         98.3 ml   --------- LA volume/bsa, ES, 1-p A2C       43.4 ml/m^2 ---------  Mitral valve              Value    Reference Mitral E-wave peak velocity      120  cm/s  --------- Mitral A-wave peak velocity      29.1 cm/s  --------- Mitral deceleration time    (L)   144  ms   150 - 230 Mitral peak gradient, D        6   mm Hg --------- Mitral E/A ratio, peak         4      ---------  Pulmonary arteries           Value    Reference PA pressure, S, DP       (H)   54  mm Hg <=30  Tricuspid valve            Value    Reference Tricuspid regurg peak velocity     339  cm/s  --------- Tricuspid peak RV-RA gradient     46  mm Hg ---------  Systemic veins             Value    Reference Estimated CVP             8   mm Hg ---------  Right ventricle            Value    Reference RV pressure, S, DP       (H)  54  mm Hg <=30 RV s&', lateral, S           13.6 cm/s  ---------  Legend: (L) and (H) mark values outside specified reference range.  ------------------------------------------------------------------- Prepared and Electronically Authenticated by  Kristeen Miss, M.D. 2016-03-09T15:24:12   Impression:  He has chronic combined systolic and diastolic heart failure due to NICM with NYHA class III symptoms that are significantly affecting his quality of life. He has a wide LBBB and it is felt that a BiV ICD would be the best option at this point for trying to improve his CHF symptoms. He will need LV epicardial leads placed via a small  left thoracotomy. I discussed the operative procedure with him and his family, the expectations for improvement, alternatives, benefits and risks including but not limited to infection, bleeding, injury to the heart or lung, malfunction of his ICD system requiring further revision and failure to improve his symptoms. He understands and agrees to proceed.  Plan:  He will be scheduled for LV epicardial lead placement via left thoracotomy on 08/14/2015.  Alleen Borne, MD [facility] [phone]

## 2015-08-08 ENCOUNTER — Other Ambulatory Visit: Payer: Self-pay | Admitting: Internal Medicine

## 2015-08-12 ENCOUNTER — Encounter (HOSPITAL_COMMUNITY): Payer: Self-pay | Admitting: *Deleted

## 2015-08-12 ENCOUNTER — Encounter (HOSPITAL_COMMUNITY)
Admission: RE | Admit: 2015-08-12 | Discharge: 2015-08-12 | Disposition: A | Discharge status: Home / Self Care | Payer: Medicare Other | Source: Ambulatory Visit | Attending: Surgery | Admitting: Surgery

## 2015-08-12 DIAGNOSIS — E039 Hypothyroidism, unspecified: Secondary | ICD-10-CM | POA: Diagnosis present

## 2015-08-12 DIAGNOSIS — Z95810 Presence of automatic (implantable) cardiac defibrillator: Secondary | ICD-10-CM | POA: Insufficient documentation

## 2015-08-12 DIAGNOSIS — I5042 Chronic combined systolic (congestive) and diastolic (congestive) heart failure: Secondary | ICD-10-CM | POA: Insufficient documentation

## 2015-08-12 DIAGNOSIS — I2510 Atherosclerotic heart disease of native coronary artery without angina pectoris: Secondary | ICD-10-CM | POA: Diagnosis not present

## 2015-08-12 DIAGNOSIS — E785 Hyperlipidemia, unspecified: Secondary | ICD-10-CM | POA: Diagnosis present

## 2015-08-12 DIAGNOSIS — Z7901 Long term (current) use of anticoagulants: Secondary | ICD-10-CM | POA: Diagnosis not present

## 2015-08-12 DIAGNOSIS — E1122 Type 2 diabetes mellitus with diabetic chronic kidney disease: Secondary | ICD-10-CM | POA: Diagnosis not present

## 2015-08-12 DIAGNOSIS — I130 Hypertensive heart and chronic kidney disease with heart failure and stage 1 through stage 4 chronic kidney disease, or unspecified chronic kidney disease: Secondary | ICD-10-CM | POA: Diagnosis present

## 2015-08-12 DIAGNOSIS — J449 Chronic obstructive pulmonary disease, unspecified: Secondary | ICD-10-CM | POA: Diagnosis present

## 2015-08-12 DIAGNOSIS — Z8673 Personal history of transient ischemic attack (TIA), and cerebral infarction without residual deficits: Secondary | ICD-10-CM | POA: Insufficient documentation

## 2015-08-12 DIAGNOSIS — Z01818 Encounter for other preprocedural examination: Secondary | ICD-10-CM | POA: Diagnosis not present

## 2015-08-12 DIAGNOSIS — Z01812 Encounter for preprocedural laboratory examination: Secondary | ICD-10-CM | POA: Insufficient documentation

## 2015-08-12 DIAGNOSIS — Z79899 Other long term (current) drug therapy: Secondary | ICD-10-CM | POA: Diagnosis not present

## 2015-08-12 DIAGNOSIS — Z9889 Other specified postprocedural states: Secondary | ICD-10-CM | POA: Diagnosis not present

## 2015-08-12 DIAGNOSIS — R338 Other retention of urine: Secondary | ICD-10-CM | POA: Diagnosis not present

## 2015-08-12 DIAGNOSIS — Z91012 Allergy to eggs: Secondary | ICD-10-CM | POA: Diagnosis not present

## 2015-08-12 DIAGNOSIS — I739 Peripheral vascular disease, unspecified: Secondary | ICD-10-CM | POA: Diagnosis not present

## 2015-08-12 DIAGNOSIS — N183 Chronic kidney disease, stage 3 (moderate): Secondary | ICD-10-CM | POA: Insufficient documentation

## 2015-08-12 DIAGNOSIS — K219 Gastro-esophageal reflux disease without esophagitis: Secondary | ICD-10-CM | POA: Insufficient documentation

## 2015-08-12 DIAGNOSIS — Z833 Family history of diabetes mellitus: Secondary | ICD-10-CM | POA: Diagnosis not present

## 2015-08-12 DIAGNOSIS — Z91018 Allergy to other foods: Secondary | ICD-10-CM | POA: Diagnosis not present

## 2015-08-12 DIAGNOSIS — Z7951 Long term (current) use of inhaled steroids: Secondary | ICD-10-CM | POA: Diagnosis not present

## 2015-08-12 DIAGNOSIS — Z888 Allergy status to other drugs, medicaments and biological substances status: Secondary | ICD-10-CM | POA: Diagnosis not present

## 2015-08-12 DIAGNOSIS — R918 Other nonspecific abnormal finding of lung field: Secondary | ICD-10-CM | POA: Diagnosis not present

## 2015-08-12 DIAGNOSIS — I129 Hypertensive chronic kidney disease with stage 1 through stage 4 chronic kidney disease, or unspecified chronic kidney disease: Secondary | ICD-10-CM | POA: Diagnosis not present

## 2015-08-12 DIAGNOSIS — Z9981 Dependence on supplemental oxygen: Secondary | ICD-10-CM | POA: Diagnosis not present

## 2015-08-12 DIAGNOSIS — Z87891 Personal history of nicotine dependence: Secondary | ICD-10-CM | POA: Insufficient documentation

## 2015-08-12 DIAGNOSIS — I429 Cardiomyopathy, unspecified: Secondary | ICD-10-CM | POA: Diagnosis not present

## 2015-08-12 DIAGNOSIS — I447 Left bundle-branch block, unspecified: Secondary | ICD-10-CM | POA: Diagnosis present

## 2015-08-12 DIAGNOSIS — I509 Heart failure, unspecified: Secondary | ICD-10-CM | POA: Diagnosis not present

## 2015-08-12 DIAGNOSIS — Z823 Family history of stroke: Secondary | ICD-10-CM | POA: Diagnosis not present

## 2015-08-12 DIAGNOSIS — I5022 Chronic systolic (congestive) heart failure: Secondary | ICD-10-CM | POA: Diagnosis not present

## 2015-08-12 DIAGNOSIS — I480 Paroxysmal atrial fibrillation: Secondary | ICD-10-CM | POA: Diagnosis present

## 2015-08-12 DIAGNOSIS — Z8249 Family history of ischemic heart disease and other diseases of the circulatory system: Secondary | ICD-10-CM | POA: Diagnosis not present

## 2015-08-12 LAB — URINALYSIS, ROUTINE W REFLEX MICROSCOPIC
Bilirubin Urine: NEGATIVE
GLUCOSE, UA: NEGATIVE mg/dL
Hgb urine dipstick: NEGATIVE
KETONES UR: NEGATIVE mg/dL
LEUKOCYTES UA: NEGATIVE
Nitrite: NEGATIVE
PH: 6 (ref 5.0–8.0)
Protein, ur: NEGATIVE mg/dL
Specific Gravity, Urine: 1.012 (ref 1.005–1.030)
Urobilinogen, UA: 0.2 mg/dL (ref 0.0–1.0)

## 2015-08-12 LAB — BLOOD GAS, ARTERIAL
ACID-BASE EXCESS: 3.3 mmol/L — AB (ref 0.0–2.0)
BICARBONATE: 26.3 meq/L — AB (ref 20.0–24.0)
Drawn by: 206361
FIO2: 0.21
O2 SAT: 97 %
PCO2 ART: 33.1 mmHg — AB (ref 35.0–45.0)
PO2 ART: 86.6 mmHg (ref 80.0–100.0)
Patient temperature: 98.6
TCO2: 27.3 mmol/L (ref 0–100)
pH, Arterial: 7.511 — ABNORMAL HIGH (ref 7.350–7.450)

## 2015-08-12 LAB — COMPREHENSIVE METABOLIC PANEL
ALT: 13 U/L — AB (ref 17–63)
ANION GAP: 11 (ref 5–15)
AST: 15 U/L (ref 15–41)
Albumin: 3.5 g/dL (ref 3.5–5.0)
Alkaline Phosphatase: 90 U/L (ref 38–126)
BUN: 15 mg/dL (ref 6–20)
CHLORIDE: 112 mmol/L — AB (ref 101–111)
CO2: 23 mmol/L (ref 22–32)
CREATININE: 2.03 mg/dL — AB (ref 0.61–1.24)
Calcium: 8.9 mg/dL (ref 8.9–10.3)
GFR, EST AFRICAN AMERICAN: 35 mL/min — AB (ref >60)
GFR, EST NON AFRICAN AMERICAN: 30 mL/min — AB (ref >60)
Glucose, Bld: 89 mg/dL (ref 65–99)
POTASSIUM: 3.5 mmol/L (ref 3.5–5.1)
Sodium: 146 mmol/L — ABNORMAL HIGH (ref 135–145)
Total Bilirubin: 0.7 mg/dL (ref 0.3–1.2)
Total Protein: 5.7 g/dL — ABNORMAL LOW (ref 6.5–8.1)

## 2015-08-12 LAB — CBC
HCT: 31.7 % — ABNORMAL LOW (ref 39.0–52.0)
Hemoglobin: 9.7 g/dL — ABNORMAL LOW (ref 13.0–17.0)
MCH: 24.9 pg — AB (ref 26.0–34.0)
MCHC: 30.6 g/dL (ref 30.0–36.0)
MCV: 81.3 fL (ref 78.0–100.0)
PLATELETS: 226 10*3/uL (ref 150–400)
RBC: 3.9 MIL/uL — ABNORMAL LOW (ref 4.22–5.81)
RDW: 18.1 % — AB (ref 11.5–15.5)
WBC: 8.8 10*3/uL (ref 4.0–10.5)

## 2015-08-12 LAB — SURGICAL PCR SCREEN
MRSA, PCR: NEGATIVE
Staphylococcus aureus: NEGATIVE

## 2015-08-12 LAB — TYPE AND SCREEN
ABO/RH(D): O POS
Antibody Screen: NEGATIVE

## 2015-08-12 LAB — GLUCOSE, CAPILLARY: Glucose-Capillary: 105 mg/dL — ABNORMAL HIGH (ref 65–99)

## 2015-08-12 LAB — PROTIME-INR
INR: 1.6 — AB (ref 0.00–1.49)
PROTHROMBIN TIME: 19.1 s — AB (ref 11.6–15.2)

## 2015-08-12 LAB — APTT: APTT: 40 s — AB (ref 24–37)

## 2015-08-12 NOTE — Progress Notes (Addendum)
Ryan from Dr Sharee Pimple office called and states for pt to stop coumadin after Sat. Dose, pt wife voices understanding. Mrs Hurford reports that they don't check his cbg's often, but usually runs around 100's, was 105 today. Mrs Hunsinger states that he is scheduled to have a sleep study 09-08-15. PCP is Dr Macarthur Critchley Plotnikov Sees Dr. Ladona Ridgel for his pacemaker Echo noted 02-26-15 Card cath 02-13-15 Stress test 2016

## 2015-08-12 NOTE — Progress Notes (Signed)
Ryan form Dr Sharee Pimple office called and pt will have to be moved to Sept 1 arrival time will be the same. States that she will continue to reach the pt and let him know of time change. Also will let him know when to stop his coumadin.

## 2015-08-12 NOTE — Progress Notes (Signed)
ICD/ Pacemaker script faxed. Pt has Medtronic ICD/Pacemaker

## 2015-08-12 NOTE — Pre-Procedure Instructions (Signed)
SHAYAAN PARKE  08/12/2015      [id number] [place], [place] - [address] [place] [address] Phone: [phone] Fax: [phone]    Your procedure is scheduled on Sept 1  Report to [facility] Admitting at 530 A.M.  Call this number if you have problems the morning of surgery:  [phone]   Remember:  Do not eat food or drink liquids after midnight.  Take these medicines the morning of surgery with A SIP OF WATER amlodipine(Norvasc), carvedilol (Coreg), hydrocodone-acetaminophen (Norco), Isosorbide mononitrate (Imdur), Symbicort inhaler, synthroid  Stop coumadin as directed by your Dr  Stop taking Asprin, Ibuprofen BC's, Goody's, Herbal medications, Fish Oil, Aleve  Do not wear jewelry, make-up or nail polish.  Do not wear lotions, powders, or perfumes.  You may wear deodorant.  Do not shave 48 hours prior to surgery.  Men may shave face and neck.  Do not bring valuables to the hospital.  [facility] is not responsible for any belongings or valuables.  Contacts, dentures or bridgework may not be worn into surgery.  Leave your suitcase in the car.  After surgery it may be brought to your room.  For patients admitted to the hospital, discharge time will be determined by your treatment team.  Patients discharged the day of surgery will not be allowed to drive home.    Special instructions: [place] - Preparing for Surgery  Before surgery, you can play an important role.  Because skin is not sterile, your skin needs to be as free of germs as possible.  You can reduce the number of germs on you skin by washing with CHG (chlorahexidine gluconate) soap before surgery.  CHG is an antiseptic cleaner which kills germs and bonds with the skin to continue killing germs even after washing.  Please DO NOT use if you have an allergy to CHG or antibacterial soaps.  If your skin becomes reddened/irritated stop using the CHG and  inform your nurse when you arrive at Short Stay.  Do not shave (including legs and underarms) for at least 48 hours prior to the first CHG shower.  You may shave your face.  Please follow these instructions carefully:   1.  Shower with CHG Soap the night before surgery and the                                morning of Surgery.  2.  If you choose to wash your hair, wash your hair first as usual with your       normal shampoo.  3.  After you shampoo, rinse your hair and body thoroughly to remove the                      Shampoo.  4.  Use CHG as you would any other liquid soap.  You can apply chg directly       to the skin and wash gently with scrungie or a clean washcloth.  5.  Apply the CHG Soap to your body ONLY FROM THE NECK DOWN.        Do not use on open wounds or open sores.  Avoid contact with your eyes,       ears, mouth and genitals (private parts).  Wash genitals (private parts)       with your normal soap.  6.  Wash  thoroughly, paying special attention to the area where your surgery        will be performed.  7.  Thoroughly rinse your body with warm water from the neck down.  8.  DO NOT shower/wash with your normal soap after using and rinsing off       the CHG Soap.  9.  Pat yourself dry with a clean towel.            10.  Wear clean pajamas.            11.  Place clean sheets on your bed the night of your first shower and do not        sleep with pets.  Day of Surgery  Do not apply any lotions/deoderants the morning of surgery.  Please wear clean clothes to the hospital/surgery center.     Please read over the following fact sheets that you were given. Pain Booklet, Coughing and Deep Breathing, Blood Transfusion Information, MRSA Information and Surgical Site Infection Prevention

## 2015-08-13 LAB — HEMOGLOBIN A1C
Hgb A1c MFr Bld: 5.6 % (ref 4.8–5.6)
MEAN PLASMA GLUCOSE: 114 mg/dL

## 2015-08-13 NOTE — Progress Notes (Signed)
Anesthesia Chart Review: Patient is a 74 year old male scheduled for left thoracotomy for placement of LV epicardial leads on 08/21/15 by Dr. Laneta Simmers. On 03/12/15, he underwent Biventricular ICD implantation with the generator inserted, unable to place LV lead due to unsatisfactory anatomy.  History includes former smoker, HTN, HLD, CVA '04, GERD, BPH, PVD, CKD stage III, mild- moderate CAD, non-ischemic CM, s/p [employer] ICD 03/12/15, left BBB, chronic combined systolic and diastolic CHF, hypothyroidism, DM2, SOB, depression.  He is scheduled for a sleep study on 09/08/15.  PCP is Dr. Posey Rea. Nephrologist is Dr. Lowell Guitar. Primary cardiologist is Dr. Antoine Poche. EP cardiologist is Dr. Ladona Ridgel. Also followed at [facility] by Dr. Shirlee Latch.  Meds include Coumadin (to hold 08/16/15), amlodipine, Lipitor, Coreg, Lexapro, 65 Fe, glipizide, hydralazine, Norco, Imdur, Protonix, oxygen, KCl, Symbicort, torsemide, Synthroid.  08/12/15 EKG: NSR with sinus arrhythmia, LAD, left BBB. No significant change since last tracing.  02/26/15 Echo: Study Conclusions - Left ventricle: Wall thickness was increased in a pattern of mild LVH. Systolic function was moderately to severely reduced. The estimated ejection fraction was in the range of 30% to 35%. Doppler parameters are consistent with a reversible restrictive pattern, indicative of decreased left ventricular diastolic compliance and/or increased left atrial pressure (grade 3 diastolic dysfunction). - Aortic valve: There was trivial regurgitation. - Mitral valve: There was moderate regurgitation. - Left atrium: The atrium was moderately dilated. - Right ventricle: The cavity size was moderately dilated. Systolic function was moderately reduced. - Right atrium: The atrium was severely dilated. - Pulmonary arteries: Systolic pressure was moderately increased. PA peak pressure: 54 mm Hg (S).  02/13/15 RHC: Hemodynamics (mmHg) RA mean 6 RV  63/7 PA 62/32, mean 46 PCWP mean 30  Oxygen saturations: PA 50% AO 97%  Cardiac Output (Fick) 4.8  Cardiac Index (Fick) 2.12 PVR 3.33 WU  Cardiac Output (Thermo) 4.08 Cardiac Index (Thermo) 1.81 PVR 3.9 WU  Final Conclusions:  - Cardiac index is low, 2.1 by Fick and 1.8 by thermodilution. He has elevated PWCP and moderate pulmonary hypertension. 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 Interestingly, right heart filling pressures are not significantly elevated.  - I suspect that high systemic blood pressure during this case had an effect on the hemodynamics. Unfortunately, he did not take his cardiac meds this morning and I was unable to lower his systemic BP effectively with IV hydralazine.  - For now, I am going to ask him to go back on his home meds. He will restart coumadin tonight. I will have him increase torsemide to 40 mg daily for now. I am not going to start digoxin given significant CKD. He needs evaluation for CRT. (Dr. Marca Ancona)   01/03/15 Nuclear stress test: Overall Impression: High risk stress nuclear study with a large, severe, partially reversible inferior and apical defect consistent with prior inferior and apical infarct; mild apical ischemia; severe LVE; study high risk due to reduced LV function. LV Ejection Fraction: 25%. LV Wall Motion: Global hypokinesis. Per note by Tereso Newcomer, PA-C: Reviewed Myoview with Dr. Rollene Rotunda. Findings not high risk enough to warrant high risk cardiac cath. I reviewed findings with his wife (on his DPR). Will try to pursue medical Rx. I have asked her to increase his Imdur to 90 mg QD.  04/16/15 RHC/LHC: CONCLUSION: Nonobstructive coronary artery disease (25% mid LAD, 30% mid D1, 40% CX, 25% proximal OM1, 40% distal  RCA). Mildly elevated pulmonary pressures. Elevated end-diastolic  pressure.  06/09/15 CXR: IMPRESSION: Small left pleural effusion and COPD.  Preoperative labs noted. H/H 9.7/31.7, stable when compared to labs since 03/2015. T&S done. Cr 2.03, stable. PTT 40. PT/INR 19.1/1.60. A1C 5.6. He will need a PT/INR on arrival. Labs appear stable, but with HGB < 10 will forward to Dr. Laneta Simmers and TCTS RN Alycia Rossetti for review (as well as Cr, and ABG findings). Defer decision for transfusion to surgeon and/or anesthesiologist.   [employer] Rep will need to be present for this procedure. Chart to be left for nurse follow-up to ensure rep has been notified.  Velna Ochs [facility]/Anesthesiology Phone [phone] 08/13/2015 5:56 PM

## 2015-08-14 ENCOUNTER — Encounter (HOSPITAL_COMMUNITY): Payer: Medicare Other

## 2015-08-14 ENCOUNTER — Telehealth: Payer: Self-pay | Admitting: *Deleted

## 2015-08-14 NOTE — Telephone Encounter (Signed)
Telephoned pt spouse and she states she thought pt had an appt in June. But he has been off Coumadin anyway for surgery (LV epicardial lead placement via left thoracotomy)   that was scheuduled for today with Dr Laneta Simmers but it was cancelled due to MD schedule. Thus, spouse states she restarted Coumadin on Wednesday and will give last dose on Friday for surgery that has been rescheduled to 08/21/15. Thus post surgery appt made for 08/29/15.

## 2015-08-15 ENCOUNTER — Other Ambulatory Visit: Payer: Self-pay | Admitting: Physician Assistant

## 2015-08-15 DIAGNOSIS — I5042 Chronic combined systolic (congestive) and diastolic (congestive) heart failure: Secondary | ICD-10-CM

## 2015-08-15 MED ORDER — HYDRALAZINE HCL 100 MG PO TABS: 100.0000 mg | ORAL_TABLET | Freq: Three times a day (TID) | ORAL | Status: DC

## 2015-08-19 ENCOUNTER — Other Ambulatory Visit: Payer: Self-pay

## 2015-08-19 NOTE — Telephone Encounter (Signed)
Pt is overdue for follow-up, pending surgery on 08/21/15, appt to check INR post surgery made for 08/29/15.  Rx for 15 tablets of Warfarin sent to [facility] on 08/14/15.  Will not refill rx again for pt until INR checked at [facility] on 08/29/15.  Gave pt enough tablets to get to this appt.

## 2015-08-20 MED ORDER — DEXTROSE 5 % IV SOLN
1.5000 g | INTRAVENOUS | Status: AC
  Administered 2015-08-21: 1.5 g via INTRAVENOUS
  Filled 2015-08-20: qty 1.5

## 2015-08-21 ENCOUNTER — Inpatient Hospital Stay (HOSPITAL_COMMUNITY)
Admission: RE | Admit: 2015-08-21 | Discharge: 2015-08-26 | DRG: 265 | Disposition: A | Discharge status: Home Health | Payer: Medicare Other | Source: Ambulatory Visit | Attending: Surgery | Admitting: Surgery

## 2015-08-21 ENCOUNTER — Inpatient Hospital Stay (HOSPITAL_COMMUNITY): Payer: Medicare Other

## 2015-08-21 ENCOUNTER — Encounter (HOSPITAL_COMMUNITY)
Admission: RE | Disposition: A | Discharge status: Home Health | Payer: Self-pay | Source: Ambulatory Visit | Attending: Surgery

## 2015-08-21 ENCOUNTER — Inpatient Hospital Stay (HOSPITAL_COMMUNITY): Payer: Medicare Other | Admitting: Anesthesiology

## 2015-08-21 ENCOUNTER — Encounter (HOSPITAL_COMMUNITY): Payer: Self-pay | Admitting: *Deleted

## 2015-08-21 ENCOUNTER — Inpatient Hospital Stay (HOSPITAL_COMMUNITY): Payer: Medicare Other | Admitting: Vascular Surgery

## 2015-08-21 DIAGNOSIS — E785 Hyperlipidemia, unspecified: Secondary | ICD-10-CM | POA: Diagnosis present

## 2015-08-21 DIAGNOSIS — Z833 Family history of diabetes mellitus: Secondary | ICD-10-CM

## 2015-08-21 DIAGNOSIS — Z823 Family history of stroke: Secondary | ICD-10-CM | POA: Diagnosis not present

## 2015-08-21 DIAGNOSIS — R338 Other retention of urine: Secondary | ICD-10-CM | POA: Diagnosis not present

## 2015-08-21 DIAGNOSIS — Z87891 Personal history of nicotine dependence: Secondary | ICD-10-CM

## 2015-08-21 DIAGNOSIS — I2510 Atherosclerotic heart disease of native coronary artery without angina pectoris: Secondary | ICD-10-CM | POA: Diagnosis present

## 2015-08-21 DIAGNOSIS — Z9889 Other specified postprocedural states: Secondary | ICD-10-CM

## 2015-08-21 DIAGNOSIS — E039 Hypothyroidism, unspecified: Secondary | ICD-10-CM | POA: Diagnosis present

## 2015-08-21 DIAGNOSIS — I447 Left bundle-branch block, unspecified: Secondary | ICD-10-CM | POA: Diagnosis present

## 2015-08-21 DIAGNOSIS — I130 Hypertensive heart and chronic kidney disease with heart failure and stage 1 through stage 4 chronic kidney disease, or unspecified chronic kidney disease: Secondary | ICD-10-CM | POA: Diagnosis not present

## 2015-08-21 DIAGNOSIS — Z8673 Personal history of transient ischemic attack (TIA), and cerebral infarction without residual deficits: Secondary | ICD-10-CM | POA: Diagnosis not present

## 2015-08-21 DIAGNOSIS — K219 Gastro-esophageal reflux disease without esophagitis: Secondary | ICD-10-CM | POA: Diagnosis present

## 2015-08-21 DIAGNOSIS — Z91012 Allergy to eggs: Secondary | ICD-10-CM

## 2015-08-21 DIAGNOSIS — Z888 Allergy status to other drugs, medicaments and biological substances status: Secondary | ICD-10-CM | POA: Diagnosis not present

## 2015-08-21 DIAGNOSIS — Z7951 Long term (current) use of inhaled steroids: Secondary | ICD-10-CM | POA: Diagnosis not present

## 2015-08-21 DIAGNOSIS — Z7901 Long term (current) use of anticoagulants: Secondary | ICD-10-CM

## 2015-08-21 DIAGNOSIS — Z91018 Allergy to other foods: Secondary | ICD-10-CM | POA: Diagnosis not present

## 2015-08-21 DIAGNOSIS — N183 Chronic kidney disease, stage 3 (moderate): Secondary | ICD-10-CM | POA: Diagnosis not present

## 2015-08-21 DIAGNOSIS — Z9981 Dependence on supplemental oxygen: Secondary | ICD-10-CM | POA: Diagnosis not present

## 2015-08-21 DIAGNOSIS — I480 Paroxysmal atrial fibrillation: Secondary | ICD-10-CM | POA: Diagnosis present

## 2015-08-21 DIAGNOSIS — Z8249 Family history of ischemic heart disease and other diseases of the circulatory system: Secondary | ICD-10-CM | POA: Diagnosis not present

## 2015-08-21 DIAGNOSIS — I429 Cardiomyopathy, unspecified: Secondary | ICD-10-CM | POA: Diagnosis not present

## 2015-08-21 DIAGNOSIS — I5042 Chronic combined systolic (congestive) and diastolic (congestive) heart failure: Secondary | ICD-10-CM | POA: Diagnosis present

## 2015-08-21 DIAGNOSIS — I255 Ischemic cardiomyopathy: Secondary | ICD-10-CM

## 2015-08-21 DIAGNOSIS — I5022 Chronic systolic (congestive) heart failure: Secondary | ICD-10-CM | POA: Diagnosis not present

## 2015-08-21 DIAGNOSIS — R918 Other nonspecific abnormal finding of lung field: Secondary | ICD-10-CM | POA: Diagnosis not present

## 2015-08-21 DIAGNOSIS — E1122 Type 2 diabetes mellitus with diabetic chronic kidney disease: Secondary | ICD-10-CM | POA: Diagnosis not present

## 2015-08-21 DIAGNOSIS — Z79899 Other long term (current) drug therapy: Secondary | ICD-10-CM | POA: Diagnosis not present

## 2015-08-21 DIAGNOSIS — Z01811 Encounter for preprocedural respiratory examination: Secondary | ICD-10-CM

## 2015-08-21 DIAGNOSIS — J449 Chronic obstructive pulmonary disease, unspecified: Secondary | ICD-10-CM | POA: Diagnosis present

## 2015-08-21 DIAGNOSIS — I509 Heart failure, unspecified: Secondary | ICD-10-CM | POA: Diagnosis present

## 2015-08-21 DIAGNOSIS — Z01818 Encounter for other preprocedural examination: Secondary | ICD-10-CM | POA: Diagnosis not present

## 2015-08-21 HISTORY — DX: Presence of automatic (implantable) cardiac defibrillator: Z95.810

## 2015-08-21 HISTORY — DX: Pneumonia, unspecified organism: J18.9

## 2015-08-21 HISTORY — DX: Presence of cardiac pacemaker: Z95.0

## 2015-08-21 HISTORY — DX: Reserved for inherently not codable concepts without codable children: IMO0001

## 2015-08-21 HISTORY — PX: EPICARDIAL PACING LEAD PLACEMENT: SHX6274

## 2015-08-21 HISTORY — PX: THORACOTOMY: SHX5074

## 2015-08-21 LAB — GLUCOSE, CAPILLARY
GLUCOSE-CAPILLARY: 107 mg/dL — AB (ref 65–99)
GLUCOSE-CAPILLARY: 123 mg/dL — AB (ref 65–99)
GLUCOSE-CAPILLARY: 124 mg/dL — AB (ref 65–99)
Glucose-Capillary: 94 mg/dL (ref 65–99)
Glucose-Capillary: 120 mg/dL — ABNORMAL HIGH (ref 65–99)

## 2015-08-21 LAB — PROTIME-INR
INR: 1.42 (ref 0.00–1.49)
Prothrombin Time: 17.4 seconds — ABNORMAL HIGH (ref 11.6–15.2)

## 2015-08-21 SURGERY — INSERTION, EPICARDIAL ELECTRODE LEAD
Anesthesia: General | Site: Chest

## 2015-08-21 MED ORDER — HYDROMORPHONE HCL 1 MG/ML IJ SOLN: 0.2500 mg | INTRAMUSCULAR | Status: DC | PRN

## 2015-08-21 MED ORDER — GLYCOPYRROLATE 0.2 MG/ML IJ SOLN
INTRAMUSCULAR | Status: AC
  Filled 2015-08-21: qty 4

## 2015-08-21 MED ORDER — SUCCINYLCHOLINE CHLORIDE 20 MG/ML IJ SOLN
INTRAMUSCULAR | Status: AC
  Filled 2015-08-21: qty 1

## 2015-08-21 MED ORDER — ACETAMINOPHEN 325 MG PO TABS: 325.0000 mg | ORAL_TABLET | ORAL | Status: DC | PRN

## 2015-08-21 MED ORDER — 0.9 % SODIUM CHLORIDE (POUR BTL) OPTIME
TOPICAL | Status: DC | PRN
  Administered 2015-08-21: 1000 mL

## 2015-08-21 MED ORDER — OXYCODONE HCL 5 MG/5ML PO SOLN: 5.0000 mg | Freq: Once | ORAL | Status: DC | PRN

## 2015-08-21 MED ORDER — PROPOFOL 10 MG/ML IV BOLUS
INTRAVENOUS | Status: DC | PRN
  Administered 2015-08-21: 30 mg via INTRAVENOUS
  Administered 2015-08-21 (×2): 20 mg via INTRAVENOUS
  Administered 2015-08-21: 10 mg via INTRAVENOUS

## 2015-08-21 MED ORDER — PHENYLEPHRINE 40 MCG/ML (10ML) SYRINGE FOR IV PUSH (FOR BLOOD PRESSURE SUPPORT)
PREFILLED_SYRINGE | INTRAVENOUS | Status: AC
  Filled 2015-08-21: qty 10

## 2015-08-21 MED ORDER — NEOSTIGMINE METHYLSULFATE 10 MG/10ML IV SOLN
INTRAVENOUS | Status: AC
  Filled 2015-08-21: qty 1

## 2015-08-21 MED ORDER — POTASSIUM CHLORIDE 10 MEQ/50ML IV SOLN: 10.0000 meq | Freq: Every day | INTRAVENOUS | Status: DC | PRN

## 2015-08-21 MED ORDER — ESCITALOPRAM OXALATE 5 MG PO TABS
5.0000 mg | ORAL_TABLET | Freq: Every day | ORAL | Status: DC
  Administered 2015-08-21 – 2015-08-26 (×6): 5 mg via ORAL
  Filled 2015-08-21 (×6): qty 1

## 2015-08-21 MED ORDER — ACETAMINOPHEN 160 MG/5ML PO SOLN
1000.0000 mg | Freq: Four times a day (QID) | ORAL | Status: AC
  Filled 2015-08-21: qty 40

## 2015-08-21 MED ORDER — BUDESONIDE-FORMOTEROL FUMARATE 160-4.5 MCG/ACT IN AERO
2.0000 | INHALATION_SPRAY | Freq: Two times a day (BID) | RESPIRATORY_TRACT | Status: DC
  Administered 2015-08-21 – 2015-08-26 (×10): 2 via RESPIRATORY_TRACT

## 2015-08-21 MED ORDER — DIPHENHYDRAMINE HCL 50 MG/ML IJ SOLN: 12.5000 mg | Freq: Four times a day (QID) | INTRAMUSCULAR | Status: DC | PRN

## 2015-08-21 MED ORDER — FENTANYL 10 MCG/ML IV SOLN
INTRAVENOUS | Status: DC
  Administered 2015-08-21 (×2): 10 ug via INTRAVENOUS
  Administered 2015-08-21: 10:00:00 via INTRAVENOUS
  Administered 2015-08-21: 10 ug via INTRAVENOUS
  Administered 2015-08-22: 20 ug via INTRAVENOUS
  Administered 2015-08-22: 12 ug via INTRAVENOUS
  Filled 2015-08-21: qty 50

## 2015-08-21 MED ORDER — FENTANYL CITRATE (PF) 250 MCG/5ML IJ SOLN
INTRAMUSCULAR | Status: AC
  Filled 2015-08-21: qty 5

## 2015-08-21 MED ORDER — BISACODYL 5 MG PO TBEC
10.0000 mg | DELAYED_RELEASE_TABLET | Freq: Every day | ORAL | Status: DC
  Administered 2015-08-22 – 2015-08-26 (×3): 10 mg via ORAL
  Filled 2015-08-21 (×4): qty 2

## 2015-08-21 MED ORDER — MIDAZOLAM HCL 2 MG/2ML IJ SOLN
INTRAMUSCULAR | Status: AC
  Filled 2015-08-21: qty 4

## 2015-08-21 MED ORDER — SENNOSIDES-DOCUSATE SODIUM 8.6-50 MG PO TABS
1.0000 | ORAL_TABLET | Freq: Every day | ORAL | Status: DC
  Administered 2015-08-21 – 2015-08-25 (×5): 1 via ORAL
  Filled 2015-08-21 (×7): qty 1

## 2015-08-21 MED ORDER — LEVALBUTEROL HCL 0.63 MG/3ML IN NEBU: 0.6300 mg | INHALATION_SOLUTION | Freq: Four times a day (QID) | RESPIRATORY_TRACT | Status: DC | PRN

## 2015-08-21 MED ORDER — INSULIN ASPART 100 UNIT/ML ~~LOC~~ SOLN
0.0000 [IU] | SUBCUTANEOUS | Status: DC
  Administered 2015-08-21 – 2015-08-23 (×6): 2 [IU] via SUBCUTANEOUS

## 2015-08-21 MED ORDER — PHENYLEPHRINE HCL 10 MG/ML IJ SOLN
INTRAMUSCULAR | Status: DC | PRN
  Administered 2015-08-21: 40 ug via INTRAVENOUS

## 2015-08-21 MED ORDER — NALOXONE HCL 0.4 MG/ML IJ SOLN: 0.4000 mg | INTRAMUSCULAR | Status: DC | PRN

## 2015-08-21 MED ORDER — ROCURONIUM BROMIDE 100 MG/10ML IV SOLN
INTRAVENOUS | Status: DC | PRN
  Administered 2015-08-21: 50 mg via INTRAVENOUS

## 2015-08-21 MED ORDER — ROCURONIUM BROMIDE 50 MG/5ML IV SOLN
INTRAVENOUS | Status: AC
  Filled 2015-08-21: qty 1

## 2015-08-21 MED ORDER — GLYCOPYRROLATE 0.2 MG/ML IJ SOLN
INTRAMUSCULAR | Status: DC | PRN
  Administered 2015-08-21: 0.4 mg via INTRAVENOUS

## 2015-08-21 MED ORDER — SODIUM CHLORIDE 0.9 % IJ SOLN: 9.0000 mL | INTRAMUSCULAR | Status: DC | PRN

## 2015-08-21 MED ORDER — ACETAMINOPHEN 500 MG PO TABS
1000.0000 mg | ORAL_TABLET | Freq: Four times a day (QID) | ORAL | Status: AC
  Administered 2015-08-21 – 2015-08-26 (×18): 1000 mg via ORAL
  Filled 2015-08-21 (×20): qty 2

## 2015-08-21 MED ORDER — ONDANSETRON HCL 4 MG/2ML IJ SOLN
INTRAMUSCULAR | Status: DC | PRN
  Administered 2015-08-21: 4 mg via INTRAVENOUS

## 2015-08-21 MED ORDER — OXYCODONE HCL 5 MG PO TABS
5.0000 mg | ORAL_TABLET | ORAL | Status: DC | PRN
  Administered 2015-08-21 – 2015-08-22 (×3): 5 mg via ORAL
  Filled 2015-08-21 (×3): qty 1

## 2015-08-21 MED ORDER — LABETALOL HCL 5 MG/ML IV SOLN: 10.0000 mg | INTRAVENOUS | Status: DC | PRN

## 2015-08-21 MED ORDER — BUDESONIDE-FORMOTEROL FUMARATE 160-4.5 MCG/ACT IN AERO
2.0000 | INHALATION_SPRAY | Freq: Two times a day (BID) | RESPIRATORY_TRACT | Status: DC
  Filled 2015-08-21: qty 6

## 2015-08-21 MED ORDER — ONDANSETRON HCL 4 MG/2ML IJ SOLN: 4.0000 mg | Freq: Four times a day (QID) | INTRAMUSCULAR | Status: DC | PRN

## 2015-08-21 MED ORDER — FENTANYL CITRATE (PF) 250 MCG/5ML IJ SOLN
INTRAMUSCULAR | Status: DC | PRN
  Administered 2015-08-21: 100 ug via INTRAVENOUS

## 2015-08-21 MED ORDER — DEXTROSE 5 % IV SOLN
1.5000 g | Freq: Two times a day (BID) | INTRAVENOUS | Status: AC
  Administered 2015-08-21 – 2015-08-22 (×2): 1.5 g via INTRAVENOUS
  Filled 2015-08-21 (×2): qty 1.5

## 2015-08-21 MED ORDER — DEXTROSE-NACL 5-0.45 % IV SOLN
INTRAVENOUS | Status: DC
  Administered 2015-08-21 – 2015-08-22 (×2): via INTRAVENOUS

## 2015-08-21 MED ORDER — TRAMADOL HCL 50 MG PO TABS
50.0000 mg | ORAL_TABLET | Freq: Four times a day (QID) | ORAL | Status: DC | PRN
  Administered 2015-08-22: 100 mg via ORAL
  Administered 2015-08-22: 50 mg via ORAL
  Filled 2015-08-21: qty 2
  Filled 2015-08-21: qty 1

## 2015-08-21 MED ORDER — LEVOTHYROXINE SODIUM 100 MCG PO TABS
100.0000 ug | ORAL_TABLET | Freq: Every day | ORAL | Status: DC
  Administered 2015-08-22 – 2015-08-26 (×5): 100 ug via ORAL
  Filled 2015-08-21 (×8): qty 1

## 2015-08-21 MED ORDER — ACETAMINOPHEN 160 MG/5ML PO SOLN
325.0000 mg | ORAL | Status: DC | PRN
  Filled 2015-08-21: qty 20.3

## 2015-08-21 MED ORDER — LACTATED RINGERS IV SOLN
INTRAVENOUS | Status: DC | PRN
  Administered 2015-08-21 (×2): via INTRAVENOUS

## 2015-08-21 MED ORDER — NEOSTIGMINE METHYLSULFATE 10 MG/10ML IV SOLN
INTRAVENOUS | Status: DC | PRN
  Administered 2015-08-21: 3 mg via INTRAVENOUS

## 2015-08-21 MED ORDER — MIDAZOLAM HCL 5 MG/5ML IJ SOLN
INTRAMUSCULAR | Status: DC | PRN
  Administered 2015-08-21: 1 mg via INTRAVENOUS

## 2015-08-21 MED ORDER — ONDANSETRON HCL 4 MG/2ML IJ SOLN
INTRAMUSCULAR | Status: AC
  Filled 2015-08-21: qty 2

## 2015-08-21 MED ORDER — DIPHENHYDRAMINE HCL 12.5 MG/5ML PO ELIX
12.5000 mg | ORAL_SOLUTION | Freq: Four times a day (QID) | ORAL | Status: DC | PRN
  Filled 2015-08-21: qty 5

## 2015-08-21 MED ORDER — OXYCODONE HCL 5 MG PO TABS: 5.0000 mg | ORAL_TABLET | Freq: Once | ORAL | Status: DC | PRN

## 2015-08-21 SURGICAL SUPPLY — 74 items
ADH SKN CLS APL DERMABOND .7 (GAUZE/BANDAGES/DRESSINGS) ×2
APL SKNCLS STERI-STRIP NONHPOA (GAUZE/BANDAGES/DRESSINGS)
BENZOIN TINCTURE PRP APPL 2/3 (GAUZE/BANDAGES/DRESSINGS) IMPLANT
CANISTER SUCTION 2500CC (MISCELLANEOUS) ×8 IMPLANT
CATH KIT ON Q 5IN SLV (PAIN MANAGEMENT) IMPLANT
CATH THORACIC 28FR (CATHETERS) ×2 IMPLANT
CATH THORACIC 36FR (CATHETERS) IMPLANT
CATH THORACIC 36FR RT ANG (CATHETERS) IMPLANT
CLIP TI MEDIUM 6 (CLIP) ×4 IMPLANT
CONN ST 1/4X3/8  BEN (MISCELLANEOUS)
CONN ST 1/4X3/8 BEN (MISCELLANEOUS) IMPLANT
CONN Y 3/8X3/8X3/8  BEN (MISCELLANEOUS)
CONN Y 3/8X3/8X3/8 BEN (MISCELLANEOUS) IMPLANT
CONT SPEC 4OZ CLIKSEAL STRL BL (MISCELLANEOUS) ×8 IMPLANT
COVER SURGICAL LIGHT HANDLE (MISCELLANEOUS) ×8 IMPLANT
DERMABOND ADVANCED (GAUZE/BANDAGES/DRESSINGS) ×2
DERMABOND ADVANCED .7 DNX12 (GAUZE/BANDAGES/DRESSINGS) IMPLANT
DRAIN CHANNEL 28F RND 3/8 FF (WOUND CARE) IMPLANT
DRAIN CHANNEL 32F RND 10.7 FF (WOUND CARE) IMPLANT
DRAPE LAPAROSCOPIC ABDOMINAL (DRAPES) ×4 IMPLANT
DRAPE WARM FLUID 44X44 (DRAPE) ×4 IMPLANT
DRILL BIT 7/64X5 (BIT) IMPLANT
ELECT BLADE 4.0 EZ CLEAN MEGAD (MISCELLANEOUS) ×4
ELECT REM PT RETURN 9FT ADLT (ELECTROSURGICAL) ×4
ELECTRODE BLDE 4.0 EZ CLN MEGD (MISCELLANEOUS) IMPLANT
ELECTRODE REM PT RTRN 9FT ADLT (ELECTROSURGICAL) ×2 IMPLANT
GAUZE SPONGE 4X4 12PLY STRL (GAUZE/BANDAGES/DRESSINGS) ×4 IMPLANT
GLOVE BIOGEL PI IND STRL 6.5 (GLOVE) IMPLANT
GLOVE BIOGEL PI INDICATOR 6.5 (GLOVE) ×6
GLOVE EUDERMIC 7 POWDERFREE (GLOVE) ×8 IMPLANT
GOWN STRL REUS W/ TWL LRG LVL3 (GOWN DISPOSABLE) ×4 IMPLANT
GOWN STRL REUS W/ TWL XL LVL3 (GOWN DISPOSABLE) ×2 IMPLANT
GOWN STRL REUS W/TWL LRG LVL3 (GOWN DISPOSABLE) ×8
GOWN STRL REUS W/TWL XL LVL3 (GOWN DISPOSABLE) ×4
KIT BASIN OR (CUSTOM PROCEDURE TRAY) ×4 IMPLANT
KIT ROOM TURNOVER OR (KITS) ×4 IMPLANT
KIT WRENCH (KITS) ×2 IMPLANT
LEAD PACING EPI (Prosthesis & Implant Heart) ×4 IMPLANT
NS IRRIG 1000ML POUR BTL (IV SOLUTION) ×16 IMPLANT
PACK CHEST (CUSTOM PROCEDURE TRAY) ×4 IMPLANT
PAD ARMBOARD 7.5X6 YLW CONV (MISCELLANEOUS) ×8 IMPLANT
SEALANT SURG COSEAL 4ML (VASCULAR PRODUCTS) IMPLANT
SEALANT SURG COSEAL 8ML (VASCULAR PRODUCTS) IMPLANT
SET EXT 12IN DIALYSIS STAY-SAF (MISCELLANEOUS) ×2 IMPLANT
SOLUTION ANTI FOG 6CC (MISCELLANEOUS) ×4 IMPLANT
SPONGE GAUZE 4X4 12PLY STER LF (GAUZE/BANDAGES/DRESSINGS) ×2 IMPLANT
SUT PROLENE 3 0 SH DA (SUTURE) IMPLANT
SUT SILK  1 MH (SUTURE) ×2
SUT SILK 1 MH (SUTURE) ×4 IMPLANT
SUT SILK 1 TIES 10X30 (SUTURE) IMPLANT
SUT SILK 2 0 SH (SUTURE) ×4 IMPLANT
SUT SILK 2 0SH CR/8 30 (SUTURE) IMPLANT
SUT SILK 3 0SH CR/8 30 (SUTURE) ×2 IMPLANT
SUT VIC AB 1 CTX 36 (SUTURE) ×4
SUT VIC AB 1 CTX36XBRD ANBCTR (SUTURE) ×2 IMPLANT
SUT VIC AB 2-0 CT1 27 (SUTURE) ×4
SUT VIC AB 2-0 CT1 TAPERPNT 27 (SUTURE) IMPLANT
SUT VIC AB 2-0 CTX 36 (SUTURE) ×2 IMPLANT
SUT VIC AB 2-0 UR6 27 (SUTURE) IMPLANT
SUT VIC AB 3-0 MH 27 (SUTURE) IMPLANT
SUT VIC AB 3-0 SH 27 (SUTURE) ×4
SUT VIC AB 3-0 SH 27X BRD (SUTURE) IMPLANT
SUT VIC AB 3-0 X1 27 (SUTURE) ×6 IMPLANT
SUT VICRYL 2 TP 1 (SUTURE) ×2 IMPLANT
SYSTEM SAHARA CHEST DRAIN ATS (WOUND CARE) ×4 IMPLANT
TAPE CLOTH SURG 4X10 WHT LF (GAUZE/BANDAGES/DRESSINGS) ×2 IMPLANT
TAPE CLOTH SURG 6X10 WHT LF (GAUZE/BANDAGES/DRESSINGS) ×2 IMPLANT
TIP APPLICATOR SPRAY EXTEND 16 (VASCULAR PRODUCTS) IMPLANT
TOWEL OR 17X24 6PK STRL BLUE (TOWEL DISPOSABLE) ×4 IMPLANT
TOWEL OR 17X26 10 PK STRL BLUE (TOWEL DISPOSABLE) ×4 IMPLANT
TRAP SPECIMEN MUCOUS 40CC (MISCELLANEOUS) IMPLANT
TRAY FOLEY CATH 14FRSI W/METER (CATHETERS) ×4 IMPLANT
TUNNELER SHEATH ON-Q 11GX8 DSP (PAIN MANAGEMENT) IMPLANT
WATER STERILE IRR 1000ML POUR (IV SOLUTION) ×8 IMPLANT

## 2015-08-21 NOTE — Progress Notes (Signed)
Leta Jungling ,[employer] Rep returned call and will be here by 0700.

## 2015-08-21 NOTE — Transfer of Care (Signed)
Immediate Anesthesia Transfer of Care Note  Patient: Frank Mclean  Procedure(s) Performed: Procedure(s): EPICARDIAL PACING LEAD PLACEMENT (N/A) MINI/LIMITED THORACOTOMY (Left)  Patient Location: PACU  Anesthesia Type:General  Level of Consciousness: sedated and patient cooperative  Airway & Oxygen Therapy: Patient Spontanous Breathing and Patient connected to face mask oxygen  Post-op Assessment: Report given to RN, Post -op Vital signs reviewed and stable and Patient moving all extremities  Post vital signs: Reviewed and stable  Last Vitals:  Filed Vitals:   08/21/15 0605  BP: 129/87  Pulse: 59  Temp: 36.7 C  Resp: 18    Complications: No apparent anesthesia complications

## 2015-08-21 NOTE — Anesthesia Procedure Notes (Addendum)
Procedure Name: Intubation Date/Time: 08/21/2015 7:44 AM Performed by: Julian Reil Pre-anesthesia Checklist: Patient identified, Emergency Drugs available, Suction available and Patient being monitored Patient Re-evaluated:Patient Re-evaluated prior to inductionOxygen Delivery Method: Circle system utilized Preoxygenation: Pre-oxygenation with 100% oxygen Intubation Type: IV induction Ventilation: Oral airway inserted - appropriate to patient size and Mask ventilation without difficulty Laryngoscope Size: Mac and 4 Grade View: Grade II Endobronchial tube: Left, Double lumen EBT, EBT position confirmed by auscultation and EBT position confirmed by fiberoptic bronchoscope and 41 Fr Number of attempts: 2 Airway Equipment and Method: Stylet,  Oral airway and Fiberoptic brochoscope Placement Confirmation: ETT inserted through vocal cords under direct vision,  positive ETCO2 and breath sounds checked- equal and bilateral Tube secured with: Tape Dental Injury: Teeth and Oropharynx as per pre-operative assessment  Comments: DL x1 by SRNA with view of epiglottis only, DL x1 by MUMM,VALERIE and AOI; patient with slightly anterior airway.    Anesthesia Procedure Note CVP: Timeout, sterile prep, drape, FBP R neck.  Trendelenburg position.  1% lido local, finder and trocar RIJ 1st pass with US guidance.  2 lumen placed over J wire. Biopatch and sterile dressing on.  Patient tolerated well.  VSS.  Jenita Seashore, MD  [phone]

## 2015-08-21 NOTE — Interval H&P Note (Signed)
History and Physical Interval Note:  08/21/2015 5:56 AM  Frank Mclean  has presented today for surgery, with the diagnosis of CARDIOMYOPATHY  The various methods of treatment have been discussed with the patient and family. After consideration of risks, benefits and other options for treatment, the patient has consented to  Procedure(s): THORACOTOMY MAJOR (Left) EPICARDIAL PACING LEAD PLACEMENT (N/A) as a surgical intervention .  [name] history has been reviewed, patient examined, no change in status, stable for surgery.  I have reviewed the patient's chart and labs.  Questions were answered to the patient's satisfaction.     Alleen Borne

## 2015-08-21 NOTE — H&P (Signed)
[address]       [place] [facility] [address]             [phone]      Cardiothoracic Surgery Admission History and Physical   PCP is Frank Primes, MD Referring Provider is Frank Morale, MD  Chief Complaint  Patient presents with  . 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 He is followed by Frank Mclean in the heart failure clinic and underwent ICD several months ago. A BiV device was implanted but his CS anatomy was not adequate for placement of a CS lead. He has been stable with class III heart failure with shortness of breath with mild exertion and sometimes at rest. He has had decreased appetite and fatigue and he doesn't really feel like doing anything. He was in the ER in June with shortness of breath and increasing lower extremity edema which responded to diuretic.   Past Medical History  Diagnosis Date  . Gout   . HTN (hypertension)   . Hyperlipidemia   . Osteoarthritis   . History of CVA (cerebrovascular accident)     Left pontine infarct July 2004; changed from Plavix to Coumadin in 2004 per MD at [facility]  . GERD (gastroesophageal reflux disease)   . DJD (degenerative joint disease)   . BPH (benign prostatic hypertrophy)   . Peripheral vascular disease   . Bilateral leg ulcer     ACHILLES AREA-- NONHEALING  . CKD (chronic kidney disease) stage 3, GFR 30-59 ml/min   . CAD (coronary artery disease)     a. LHC 4/12: Mid LAD 25%, mid diagonal 30%, AV circumflex 40%, proximal OM 25%, distal RCA 40%  . Chronic combined systolic and diastolic heart failure     a. Echo 05/2013: EF 25-30%, diffuse HK, restrictive physiology, trivial AI, mild MR, moderate LAE, reduced RV systolic function, PASP 42  . LBBB (left bundle branch  block)   . PAF (paroxysmal atrial fibrillation)     a. amiodarone Rx started 05/2013; b. chronic coumadin  . NICM (nonischemic cardiomyopathy)     a. EF 25-30%.  [name] Hx of cardiovascular stress test     Nuclear Stress Test (1/16): High risk stress nuclear study with a large, severe, partially reversible inferior and apical defect consistent with prior inferior and apical infarct; mild apical ischemia; severe LVE; study high risk due to reduced LV function. EF 25% >> reviewed with Frank. Antoine [facility] >> medical mgmt  . Hypothyroidism   . DM (diabetes mellitus), type 2     Past Surgical History  Procedure Laterality Date  . Cardiac catheterization  04-16-2011 [facility]    NON-OBSTRUCTIVE CAD. MILDLY ELEVATED PULMONARY PRESSURES/ ELEVATED END-DIASTOLIC PRESSURE  . Transthoracic echocardiogram  12-31-2010    MODERATE CONCENTRIC LVH/ SYSTOLIC FUNCTION SEVERELY REDUCED/ EF 25-30%/ SEVERE HYPOKINESIS OF ANTEROSEPTAL MYOCARDIUM AND ENTIREAPICAL MYOCARDIUM / MODERATE HYPOKINESIS OF LATERAL, INFEROLATERAL, INFERIOR,AND INFEROSEPTAL MYOCARIUM/ GRADE 3 DIASTOLIC DYSFUNCTION/ MILD MR  . Aortogram w/ bilateral lower extremitiy runoff  05-18-2013 [facility]    LEFT LEG OCCLUDED PERONEAL AND ANTERIOR TIBIAL ARTERIES/ HIGH GRADE STENOIS 80% MIDDLE AND DISTAL THIRD OF POSTERIOR TIBIAL ARTERY/ RIGHT PERONEAL AND ANTERIOR TIBIAL ARTERY OCCLUDED/ 40% STENOSIS DISTALLY   . Abdominal aortagram N/A 05/18/2013    Procedure: ABDOMINAL  [facility]; Surgeon: Frank Kerns, MD; Location: [facility] CATH LAB; Service: Cardiovascular; Laterality: N/A;  . Lower extremity angiogram Bilateral 05/18/2013    Procedure: LOWER EXTREMITY ANGIOGRAM; Surgeon: Frank Kerns, MD; Location: [facility] C[facility] LAB; Service: Cardiovascular; Laterality: Bilateral;  . Right heart catheterization N/A 02/13/2015    Procedure: RIGHT HEART CATH; Surgeon: Frank Morale, MD;  Location: [facility] CATH LAB; Service: Cardiovascular; Laterality: N/A;  . Bi-ventricular implantable cardioverter defibrillator N/A 03/12/2015    Procedure: BI-VENTRICULAR IMPLANTABLE CARDIOVERTER DEFIBRILLATOR (CRT-D); Surgeon: Frank Maw, MD; Location: [facility] CATH LAB; Service: Cardiovascular; Laterality: N/A;    Family History  Problem Relation Age of Onset  . Hypertension Mother   . Heart disease Mother   . Diabetes Mother   . Gout Other   . Stroke Other   . Hypertension Father   . Heart disease Father   . Heart attack Maternal Grandmother   . Thyroid disease Neg Hx     Social History Social History  Substance Use Topics  . Smoking status: Former Smoker    Quit date: 04/12/1974  . Smokeless tobacco: Never Used  . Alcohol Use: No     Comment: previously drank - "love cognac" quit 15 yrs ago.    Current Outpatient Prescriptions  Medication Sig Dispense Refill  . acetaminophen (TYLENOL) 500 MG tablet Take 500 mg by mouth every 6 (six) hours as needed.    [name] amLODipine (NORVASC) 10 MG tablet TAKE 1 TABLET BY MOUTH EVERY DAY 30 tablet 6  . atorvastatin (LIPITOR) 20 MG tablet Take 1 tablet (20 mg total) by mouth daily. 90 tablet 3  . carvedilol (COREG) 6.25 MG tablet Take 1 tablet (6.25 mg total) by mouth 2 (two) times daily with a meal. 60 tablet 6  . cholecalciferol (VITAMIN D) 1000 UNITS tablet Take 1 tablet (1,000 Units total) by mouth daily. 30 tablet 1  . CVS VITAMIN B12 1000 MCG tablet TAKE 1 TABLET BY MOUTH DAILY 100 tablet 1  . escitalopram (LEXAPRO) 5 MG tablet Take 1 tablet (5 mg total) by mouth daily. 30 tablet 5  . ferrous sulfate 325 (65 FE) MG tablet Take 1 tablet (325 mg total) by mouth daily. 30 tablet 6  . glipiZIDE (GLUCOTROL) 5 MG tablet TAKE 1/2 TABLET BY MOUTH TWICE A DAY BEFORE A MEAL 30 tablet 11  . hydrALAZINE (APRESOLINE) 100 MG  tablet Take 1 tablet (100 mg total) by mouth 3 (three) times daily. 90 tablet 11  . HYDROcodone-acetaminophen (NORCO/VICODIN) 5-325 MG per tablet Take 1 tablet by mouth every 6 (six) hours as needed for moderate pain. 60 tablet 0  . isosorbide mononitrate (IMDUR) 60 MG 24 hr tablet Take 1.5 tablets for 90 mg by mouth twice a day. 45 tablet 11  . OXYGEN Inhale 2.5 L into the lungs See admin instructions. USES OXYGEN EVERY BEDTIME USES DURING DAY ONLY AS NEEDED    . pantoprazole (PROTONIX) 40 MG tablet Take 1 tablet (40 mg total) by mouth daily. 90 tablet 3  . polyethylene glycol (MIRALAX / GLYCOLAX) packet Take 17 g by mouth daily. 14 each 0  . potassium chloride SA (K-DUR,KLOR-CON) 20 MEQ tablet Take 1.5 tablets (30 mEq total) by mouth 3 (three) times daily.    . SYMBICORT 160-4.5 MCG/ACT inhaler INHALE 2 PUFFS INTO THE LUNGS 2 TIMES DAILY 10.2 Inhaler 5  . SYNTHROID 100 MCG tablet TAKE 2 TABLETS BY MOUTH EVERY MORNING BEFORE BREAKFAST 60 tablet 11  . torsemide (DEMADEX) 20 MG tablet Take 40mg  (2  tablets) once daily alternating with 20mg  (1 tablet) the next day, continuously. 75 tablet 6  . warfarin (COUMADIN) 5 MG tablet 5 mg tablet daily except one half tablet on Mondays and Fridays 30 tablet 1   No current facility-administered medications for this visit.    Allergies  Allergen Reactions  . Levothyroxine Sodium Other (See Comments)    MUST TAKE BRAND NAME GENERIC DOESN'T WORK FOR PATIENT  . Chicken Protein Nausea Only and Other (See Comments)    Does not like chicken or [place]  . Eggs Or Egg-Derived Products Nausea Only    Does not like eggs    Review of Systems  Constitutional: Positive for activity change, appetite change, fatigue and unexpected weight change.  HENT:   Dentures  Eyes: Negative.  Respiratory: Positive for wheezing.   Uses oxygen as needed  Cardiovascular: Positive for leg  swelling. Negative for chest pain and palpitations.   Shortness of breath with exertion and at rest  Gastrointestinal: Negative.  Genitourinary: Negative.  Musculoskeletal: Positive for joint swelling, arthralgias and gait problem.  Skin: Negative.  Allergic/Immunologic: Negative.  Neurological:   History of stroke with slurred speech  Hematological: Negative.  Psychiatric/Behavioral: Negative.    BP 119/75 mmHg  Pulse 65  Resp 16  Ht 6' 2.5" (1.892 m)  Wt 209 lb (94.802 kg)  BMI 26.48 kg/m2  SpO2 91% Physical Exam  Constitutional: He is oriented to person, place, and time. He appears well-developed and well-nourished. No distress.  HENT:  Head: Normocephalic and atraumatic.  Mouth/Throat: Oropharynx is clear and moist.  Eyes: EOM are normal. Pupils are equal, round, and reactive to light.  Neck: Normal range of motion. Neck supple. No thyromegaly present.  Cardiovascular: Normal rate, regular rhythm, normal heart sounds and intact distal pulses.  No murmur heard. Pulmonary/Chest: Effort normal and breath sounds normal. No respiratory distress. He has no wheezes. He has no rales.  ICD incision well-healed  Abdominal: Soft. Bowel sounds are normal. He exhibits no distension and no mass. There is no tenderness.  Musculoskeletal: He exhibits no edema.  Lymphadenopathy:   He has no cervical adenopathy.  Neurological: He is alert and oriented to person, place, and time. He has normal strength. No cranial nerve deficit or sensory deficit.  Skin: Skin is warm and dry.  Psychiatric: He has a normal mood and affect.     Diagnostic Tests:        *[place]*         *[facility]*            [address]            [place], [place] [address]              [phone]  ------------------------------------------------------------------- Echocardiography  Patient:  Frank [facility]clean, Frank Mclean MR  #:    [id number] Study Date: 02/26/2015 Gender:   M Age:    73 Height:   188 cm Weight:   97.1 kg BSA:    2.27 m^2 Pt. Status: Room:  Nicole Cella, M.D. REFERRING  Marca Ancona, M.D. SONOGRAPHER Nolon Rod, RDCS REFERRING  Plotnikov, Alex ATTENDING  Bensimhon, Reuel Boom PERFORMING  [facility], Outpatient  cc:  ------------------------------------------------------------------- LV EF: 30% -  35%  ------------------------------------------------------------------- Indications:   CHF - 428.0.  ------------------------------------------------------------------- History:  PMH:  Congestive heart failure. Risk factors: Hypertension. Diabetes mellitus.  ------------------------------------------------------------------- Study Conclusions  - Left ventricle: Wall thickness was increased in a pattern of mild LVH. Systolic  function was moderately to severely reduced. The estimated ejection fraction was in the range of 30% to 35%. Doppler parameters are consistent with a reversible restrictive pattern, indicative of decreased left ventricular diastolic compliance and/or increased left atrial pressure (grade 3 diastolic dysfunction). - Aortic valve: There was trivial regurgitation. - Mitral valve: There was moderate regurgitation. - Left atrium: The atrium was moderately dilated. - Right ventricle: The cavity size was moderately dilated. Systolic function was moderately reduced. - Right atrium: The atrium was severely dilated. - Pulmonary arteries: Systolic pressure was moderately increased. PA peak pressure: 54 mm Hg (S).  Echocardiography. M-mode, complete 2D, spectral Doppler, and color Doppler. Birthdate: Patient birthdate: [birth year]. Age: Patient is 74 yr old. Sex: Gender: male.  BMI: 27.5 kg/m^2. Blood pressure:   126/88 Patient status: Outpatient. Study date: Study date: 02/26/2015. Study time:  10:56 AM. Location: Echo laboratory.  -------------------------------------------------------------------  ------------------------------------------------------------------- Left ventricle: The global longgitudinal strain is -12. 7 Wall thickness was increased in a pattern of mild LVH.  Systolic function was moderately to severely reduced. The estimated ejection fraction was in the range of 30% to 35%. Doppler parameters are consistent with a reversible restrictive pattern, indicative of decreased left ventricular diastolic compliance and/or increased left atrial pressure (grade 3 diastolic dysfunction).  ------------------------------------------------------------------- Aortic valve:  Structurally normal valve.  Cusp separation was normal. Doppler: Transvalvular velocity was within the normal range. There was no stenosis. There was trivial regurgitation.  ------------------------------------------------------------------- Aorta: Aortic root: The aortic root was normal in size. Ascending aorta: The ascending aorta was normal in size.  ------------------------------------------------------------------- Mitral valve:  Structurally normal valve.  Leaflet separation was normal. Doppler: Transvalvular velocity was within the normal range. There was no evidence for stenosis. There was moderate regurgitation.  Peak gradient (D): 6 mm Hg.  ------------------------------------------------------------------- Left atrium: The atrium was moderately dilated.  ------------------------------------------------------------------- Right ventricle: The cavity size was moderately dilated. Systolic function was moderately reduced.  ------------------------------------------------------------------- Pulmonic valve:  The valve appears to be grossly normal. Doppler: There was no significant  regurgitation.  ------------------------------------------------------------------- Tricuspid valve:  Structurally normal valve.  Leaflet separation was normal. Doppler: Transvalvular velocity was within the normal range. There was trivial regurgitation.  ------------------------------------------------------------------- Pulmonary artery:  Systolic pressure was moderately increased.  ------------------------------------------------------------------- Right atrium: The atrium was severely dilated.  ------------------------------------------------------------------- Pericardium: There was no pericardial effusion.  ------------------------------------------------------------------- Systemic veins: Inferior vena cava: The vessel was normal in size. The respirophasic diameter changes were in the normal range (>= 50%), consistent with normal central venous pressure.  ------------------------------------------------------------------- Measurements  Left ventricle             Value    Reference LV ID, ED, PLAX chordal    (H)   59.1 mm   43 - 52 LV ID, ES, PLAX chordal    (H)   54  mm   23 - 38 LV fx shortening, PLAX chordal (L)   9   %   >=29 LV PW thickness, ED          12.8 mm   --------- IVS/LV PW ratio, ED          0.91     <=1.3 LV e&', lateral             5.22 cm/s  --------- LV E/e&', lateral            22.99    --------- LV e&', medial             3.05 cm/s  ---------  LV E/e&', medial            39.34    --------- LV e&', average             4.14 cm/s  --------- LV E/e&', average            29.02    ---------  Ventricular septum           Value    Reference IVS thickness, ED           11.6 mm   ---------  LVOT                  Value     Reference LVOT ID, S               22  mm   --------- LVOT area               3.8  cm^2  ---------  Aorta                 Value    Reference Aortic root ID, ED           34  mm   ---------  Left atrium              Value    Reference LA ID, A-P, ES             56  mm   --------- LA ID/bsa, A-P         (H)   2.47 cm/m^2 <=2.2 LA volume, ES, 1-p A4C         119  ml   --------- LA volume/bsa, ES, 1-p A4C       52.5 ml/m^2 --------- LA volume, ES, 1-p A2C         98.3 ml   --------- LA volume/bsa, ES, 1-p A2C       43.4 ml/m^2 ---------  Mitral valve              Value    Reference Mitral E-wave peak velocity      120  cm/s  --------- Mitral A-wave peak velocity      29.1 cm/s  --------- Mitral deceleration time    (L)   144  ms   150 - 230 Mitral peak gradient, D        6   mm Hg --------- Mitral E/A ratio, peak         4      ---------  Pulmonary arteries           Value    Reference PA pressure, S, DP       (H)   54  mm Hg <=30  Tricuspid valve            Value    Reference Tricuspid regurg peak velocity     339  cm/s  --------- Tricuspid peak RV-RA gradient     46  mm Hg ---------  Systemic veins             Value    Reference Estimated CVP             8   mm Hg ---------  Right ventricle            Value    Reference RV pressure, S, DP       (H)   54  mm Hg <=30 RV s&', lateral, S  13.6 cm/s  ---------  Legend: (L) and (H) mark values outside specified reference range.  ------------------------------------------------------------------- Prepared and Electronically Authenticated by  Kristeen Miss, M.D. 2016-03-09T15:24:12   Impression:  He has chronic combined systolic and diastolic heart failure due to NICM with NYHA class III symptoms that are significantly affecting his quality of life. He has a wide LBBB and it is felt that a BiV ICD would be the best option at this point for trying to improve his CHF symptoms. He will need LV epicardial leads placed via a small left thoracotomy. I discussed the operative procedure with him and his family, the expectations for improvement, alternatives, benefits and risks including but not limited to infection, bleeding, injury to the heart or lung, malfunction of his ICD system requiring further revision and failure to improve his symptoms. He understands and agrees to proceed.  Plan:   LV epicardial lead placement via left thoracotomy   Alleen Borne, MD [facility] [phone]

## 2015-08-21 NOTE — Progress Notes (Signed)
Message left for [employer] Rep PT. Surgery time and need to be present

## 2015-08-21 NOTE — Op Note (Signed)
CARDIOTHORACIC SURGERY OPERATIVE NOTE  08/21/2015 Frank [facility] [id number]  Surgeon:  Alleen Borne, MD  First Assistant: none   Preoperative Diagnosis:  Cardiomyopathy  Postoperative Diagnosis: same  Procedure:  1. Left anterolateral thoracotomy 2. Insertion of 2 left ventricular epicardial pacing leads 3. 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 He is followed by Dr. Shirlee Latch in the heart failure clinic and underwent ICD several months ago. A BiV device was implanted but his CS anatomy was not adequate for placement of a CS lead. He has been stable with class III heart failure with shortness of breath with mild exertion and sometimes at rest. He has had decreased appetite and fatigue and he doesn't really feel like doing anything. He was in the ER in June with shortness of breath and increasing lower extremity edema which responded to diuretic.   He has chronic combined systolic and diastolic heart failure due to NICM with NYHA class III symptoms that are significantly affecting his quality of life. He has a wide LBBB and it is felt that a BiV ICD would be the best option at this point for trying to improve his CHF symptoms. He will need LV epicardial leads placed via a small left thoracotomy. I discussed the operative procedure with him and his family, the expectations for improvement, alternatives, benefits and risks including but not limited to infection, bleeding, injury to the heart or lung, malfunction of his ICD system requiring further revision and failure to improve his symptoms. He understands and agrees to proceed.  Preparation:  The patient was seen in the preoperative holding area and the correct patient, correct operation, correct  operative side were confirmed with the patient after reviewing the medical record. The consent was signed by me. Left side of the chest was signed.  Preoperative antibiotics were given.  The patient was taken back to the operating room and positioned supine on the operating room table. After being placed under general endotracheal anesthesia by the anesthesia team using a double lumen tube a foley catheter was placed. A roll was place beneath the left back to tilt the left side up slightly. The chest was prepped with betadine soap and solution.  A surgical time-out was taken and the correct patient,operative side, and operative procedure were confirmed with the nursing and anesthesia staff.   Operative Procedure:  A short anterolateral thoracotomy incision was made below the pectoralis border. The subcutaneous tissue was divided using electrocautery. The serratus muscle was split along its fibers. The pleural space was entered through the 6th intercostal space. The pericardium was immediately adjacent to the chest wall due to cardiac enlargement. The pericardium was opened. A site was chosen for pacing lead insertion on the mid-lateral wall. A Medtronic pacing lead was screwed into the myocardium ( model 5071, SN [id number]). It was tested and the Rs was 21.1 mv. Impedence was 847. Threshold was 0.6V@ 0.33ms. Another lead of the same type was screwed into the myocardium about 2 cm away from the first ( model 5071, SN [id number]). Testing showed an Rs of 16.1, Impedence of 818, and a threshold of 0.7@ 0.5 ms. The generator pocket was opened through the prior incision. There was a small amout of serous fluid present in the pocket. The generator was removed.  The two LV epicardial leads were brought out through the interspace and tunneled along the subcutaneous tissue of the chest wall up to the generator pocket. The first lead ([id number]) was connected to the generator and the other lead was capped. Through the  device the RA Ps was 4.9 mv, impedence 418, and threshold 0.5V @ 0.4 ms. The RV lead had an impedence of 342 and a threshold of 0.5 @ 0.4 ms. R wave was 13.8 mv. The pocket was enlarged slightly inferiorly. The generator was replaced in the pocket and the excess lead coiled behind it. The pocket was irrigated with saline. The subcutaneous tissue was closed with 3-0 vicryl continuous suture. The skin was closed with 4-0 vicryl subcuticular suture. A 28 F chest tube was placed into the left pleural space through a separate small incision. The thoracotomy incision was closed using continuous #0 vicryl suture for the serratus muscle, 2-0 vicryl subcutaneous suture and 3-0 vicryl subcuticular suture. Dermabond was applied.  All sponge, needle, and instrument counts were reported correct at the end of the case. Dry sterile dressings were placed over the incisions and around the chest tube which were connected to pleurevac suction. The patient was extubated and transported to the PACU in satisfactory and stable condition.

## 2015-08-21 NOTE — Progress Notes (Signed)
Report given to celine rn as caregiver 

## 2015-08-21 NOTE — Anesthesia Preprocedure Evaluation (Addendum)
Anesthesia Evaluation  Patient identified by MRN, date of birth, ID band Patient awake    Reviewed: Allergy & Precautions, NPO status , Patient's Chart, lab work & pertinent test results  History of Anesthesia Complications Negative for: history of anesthetic complications  Airway Mallampati: II  TM Distance: >3 FB Neck ROM: Full    Dental  (+) Teeth Intact   Pulmonary shortness of breath, COPDformer smoker (quit 1975),    + decreased breath sounds      Cardiovascular hypertension, Pt. on medications and Pt. on home beta blockers + CAD (non-obstructive), + Peripheral Vascular Disease and +CHF + dysrhythmias + pacemaker (Bi-V) + Cardiac Defibrillator Rhythm:Regular  3/16 ECHO: EF 30-35%, mod MR   Neuro/Psych PSYCHIATRIC DISORDERS Depression CVA    GI/Hepatic GERD-  Medicated,  Endo/Other  diabetes, Oral Hypoglycemic AgentsHypothyroidism   Renal/GU Renal InsufficiencyRenal disease (creat 2.03)     Musculoskeletal  (+) Arthritis -,   Abdominal   Peds  Hematology  (+) anemia ,   Anesthesia Other Findings   Reproductive/Obstetrics                         Anesthesia Physical Anesthesia Plan  ASA: III  Anesthesia Plan: General   Post-op Pain Management:    Induction: Intravenous  Airway Management Planned: Double Lumen EBT  Additional Equipment: Arterial line  Intra-op Plan:   Post-operative Plan: Extubation in OR  Informed Consent: I have reviewed the patients History and Physical, chart, labs and discussed the procedure including the risks, benefits and alternatives for the proposed anesthesia with the patient or authorized representative who has indicated his/her understanding and acceptance.   Dental advisory given  Plan Discussed with: CRNA and Surgeon  Anesthesia Plan Comments:         Anesthesia Quick Evaluation

## 2015-08-21 NOTE — Brief Op Note (Signed)
08/21/2015  9:35 AM  PATIENT:  Frank Mclean  74 y.o. male  PRE-OPERATIVE DIAGNOSIS:  CARDIOMYOPATHY  POST-OPERATIVE DIAGNOSIS:  CARDIOMYOPATHY  PROCEDURE:  Procedure(s): EPICARDIAL PACING LEAD PLACEMENT (N/A) MINI/LIMITED THORACOTOMY (Left)  SURGEON:  Surgeon(s) and Role:    * Alleen Borne, MD - Primary  PHYSICIAN ASSISTANT: none  ASSISTANTS: none   ANESTHESIA:   none  EBL:  Total I/O In: -  Out: 325 [Urine:325]  BLOOD ADMINISTERED:none  DRAINS: 28 F  Chest Tube(s) in the left pleural space   LOCAL MEDICATIONS USED:  NONE  SPECIMEN:  No Specimen  DISPOSITION OF SPECIMEN:  N/A  COUNTS:  YES  TOURNIQUET:  * No tourniquets in log *  DICTATION: .Note written in EPIC  PLAN OF CARE: Admit to inpatient   PATIENT DISPOSITION:  PACU - hemodynamically stable.   Delay start of Pharmacological VTE agent (>24hrs) due to surgical blood loss or risk of bleeding: yes

## 2015-08-21 NOTE — Progress Notes (Signed)
[facility] BRIEF SICU PROGRESS NOTE  Day of Surgery  S/P Procedure(s) (LRB): EPICARDIAL PACING LEAD PLACEMENT (N/A) MINI/LIMITED THORACOTOMY (Left)   Resting comfortably w/ good analgesia Paced rhythm w/ stable BP although somewhat elevated O2 sats 92-93% on 3 L/min via [place] UOP adequate  Plan: Continue routine early postop  Purcell Nails 08/21/2015 7:20 PM

## 2015-08-22 ENCOUNTER — Inpatient Hospital Stay (HOSPITAL_COMMUNITY): Payer: Medicare Other

## 2015-08-22 ENCOUNTER — Encounter (HOSPITAL_COMMUNITY): Payer: Self-pay | Admitting: Surgery

## 2015-08-22 LAB — CBC
HEMATOCRIT: 33.7 % — AB (ref 39.0–52.0)
Hemoglobin: 9.9 g/dL — ABNORMAL LOW (ref 13.0–17.0)
MCH: 24.5 pg — ABNORMAL LOW (ref 26.0–34.0)
MCHC: 29.4 g/dL — ABNORMAL LOW (ref 30.0–36.0)
MCV: 83.4 fL (ref 78.0–100.0)
Platelets: 214 10*3/uL (ref 150–400)
RBC: 4.04 MIL/uL — AB (ref 4.22–5.81)
RDW: 17.5 % — ABNORMAL HIGH (ref 11.5–15.5)
WBC: 12.4 10*3/uL — AB (ref 4.0–10.5)

## 2015-08-22 LAB — GLUCOSE, CAPILLARY
GLUCOSE-CAPILLARY: 121 mg/dL — AB (ref 65–99)
GLUCOSE-CAPILLARY: 141 mg/dL — AB (ref 65–99)
Glucose-Capillary: 123 mg/dL — ABNORMAL HIGH (ref 65–99)
Glucose-Capillary: 111 mg/dL — ABNORMAL HIGH (ref 65–99)
Glucose-Capillary: 132 mg/dL — ABNORMAL HIGH (ref 65–99)
Glucose-Capillary: 121 mg/dL — ABNORMAL HIGH (ref 65–99)
Glucose-Capillary: 109 mg/dL — ABNORMAL HIGH (ref 65–99)
Glucose-Capillary: 112 mg/dL — ABNORMAL HIGH (ref 65–99)

## 2015-08-22 LAB — BASIC METABOLIC PANEL
ANION GAP: 7 (ref 5–15)
BUN: 15 mg/dL (ref 6–20)
CO2: 26 mmol/L (ref 22–32)
Calcium: 8.9 mg/dL (ref 8.9–10.3)
Chloride: 109 mmol/L (ref 101–111)
Creatinine, Ser: 1.96 mg/dL — ABNORMAL HIGH (ref 0.61–1.24)
GFR calc Af Amer: 36 mL/min — ABNORMAL LOW (ref >60)
GFR calc non Af Amer: 31 mL/min — ABNORMAL LOW (ref >60)
GLUCOSE: 125 mg/dL — AB (ref 65–99)
POTASSIUM: 3.5 mmol/L (ref 3.5–5.1)
Sodium: 142 mmol/L (ref 135–145)

## 2015-08-22 MED ORDER — SODIUM CHLORIDE 0.9 % IJ SOLN
10.0000 mL | Freq: Two times a day (BID) | INTRAMUSCULAR | Status: DC
  Administered 2015-08-23 – 2015-08-24 (×2): 10 mL

## 2015-08-22 MED ORDER — TORSEMIDE 20 MG PO TABS
20.0000 mg | ORAL_TABLET | Freq: Every day | ORAL | Status: DC
  Administered 2015-08-22 – 2015-08-26 (×5): 20 mg via ORAL
  Filled 2015-08-22 (×7): qty 1

## 2015-08-22 MED ORDER — AMLODIPINE BESYLATE 10 MG PO TABS
10.0000 mg | ORAL_TABLET | Freq: Every day | ORAL | Status: DC
  Administered 2015-08-22 – 2015-08-26 (×5): 10 mg via ORAL
  Filled 2015-08-22 (×7): qty 1

## 2015-08-22 MED ORDER — CARVEDILOL 6.25 MG PO TABS
6.2500 mg | ORAL_TABLET | Freq: Two times a day (BID) | ORAL | Status: DC
  Administered 2015-08-22 – 2015-08-26 (×9): 6.25 mg via ORAL
  Filled 2015-08-22 (×13): qty 1

## 2015-08-22 MED ORDER — SODIUM CHLORIDE 0.9 % IJ SOLN: 10.0000 mL | INTRAMUSCULAR | Status: DC | PRN

## 2015-08-22 NOTE — Discharge Instructions (Signed)
Thoracotomy, Care After ° These instructions give you information on caring for yourself after your procedure. Your doctor may also give you more specific instructions. Call your doctor if you have any problems or questions after your procedure. °HOME CARE °· Only take medicine as told by your doctor. Take pain medicine before your pain gets very bad. °· Take deep breaths to protect yourself from a lung infection (pneumonia). °· Cough often to clear thick spit (mucus) and to open your lungs. Hold a pillow against your chest if it hurts to cough. °· Use your breathing machine (incentive spirometer) as told. °· Change bandages (dressings) as told by your doctor. °· Take off the bandage over your chest tube area as told by your doctor. °· Continue your normal diet as told by your doctor. °· Eat high-fiber foods. This includes whole grain cereals, brown rice, beans, and fresh fruits and vegetables. °· Drink enough fluids to keep your pee (urine) clear or pale yellow. Avoid caffeine. °· Talk to your doctor about taking a medicine to soften your poop (stool softener, laxative). °· Keep doing activities as told by your doctor. Balance your activity with rest. °· Avoid lifting until your doctor says it is okay. °· Do not drive until told by your doctor. Do not drive while taking pain medicines (narcotics). °· Do not bathe, swim, or use a hot tub until told by your doctor. You may shower. Gently wash the area of your cut (incision) with soap and water as told. °· Do not use any tobacco products including cigarettes, chewing tobacco, and electronic cigarettes. Avoid being around others who smoke. °· Schedule a doctor visit to get your stitches or staples removed as told. °· Schedule and go to all doctor visits as told. °· Go to therapy that can help improve your lungs (pulmonary rehabilitation) as told by your doctor. °· Do not travel by airplane for 2 weeks after the chest tube is taken out. °GET HELP IF: °· You are bleeding  from your wounds. °· You have redness, puffiness (swelling), or more pain in the wounds. °· You feel your heart beating fast or skipping beats (irregular heartbeat). °· You have yellowish-white fluid (pus) coming from your wounds. °· You have a bad smell coming from the wound or bandage. °· You have a fever or chills. °· You feel sick to your stomach or you throw up (vomit). °· You have muscle aches. °GET HELP RIGHT AWAY IF: °· You have a rash. °· You have trouble breathing. °· Your medicines are causing you problems. °· You keep feeling sick to your stomach (nauseous). °· You feel light-headed. °· You have shortness of breath or chest pain. °· You have pain that will not go away. °MAKE SURE YOU: °· Understand these instructions. °· Will watch your condition. °· Will get help right away if you are not doing well or get worse. °Document Released: 06/06/2012 Document Revised: 04/22/2014 Document Reviewed: 07/25/2013 °ExitCare® Patient Information ©2015 [employer]. This information is not intended to replace advice given to you by your health care provider. Make sure you discuss any questions you have with your health care provider. ° °
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
## 2015-08-22 NOTE — Progress Notes (Signed)
      [address]       [place] [address]             [phone]      Resting comfortably  BP 125/71 mmHg  Pulse 64  Temp(Src) 98.3 F (36.8 C) (Oral)  Resp 12  Ht 6' 2.5" (1.892 m)  Wt 210 lb 5.1 oz (95.4 kg)  BMI 26.65 kg/m2  SpO2 99%   Intake/Output Summary (Last 24 hours) at 08/22/15 1829 Last data filed at 08/22/15 1500  Gross per 24 hour  Intake   1650 ml  Output    690 ml  Net    960 ml    Stable POD # 1  Kile Kabler C. Dorris Fetch, MD [facility] [phone]

## 2015-08-22 NOTE — Progress Notes (Addendum)
1 Day Post-Op Procedure(s) (LRB): EPICARDIAL PACING LEAD PLACEMENT (N/A) MINI/LIMITED THORACOTOMY (Left) Subjective: No complaints. Says he is not having pain and not using PCA  Objective: Vital signs in last 24 hours: Temp:  [97.6 F (36.4 C)-98 F (36.7 C)] 97.6 F (36.4 C) (09/02 0345) Pulse Rate:  [59-63] 60 (09/02 0700) Cardiac Rhythm:  [-] A-V Sequential paced (09/01 2000) Resp:  [12-25] 13 (09/02 0700) BP: (115-144)/(67-95) 128/74 mmHg (09/02 0600) SpO2:  [88 %-100 %] 99 % (09/02 0807) Arterial Line BP: (137-171)/(58-75) 140/61 mmHg (09/02 0700) FiO2 (%):  [91 %] 91 % (09/02 0345) Weight:  [95.4 kg (210 lb 5.1 oz)] 95.4 kg (210 lb 5.1 oz) (09/02 0600)  Hemodynamic parameters for last 24 hours:    Intake/Output from previous day: 09/01 0701 - 09/02 0700 In: 2310 [P.O.:360; I.V.:1900; IV Piggyback:50] Out: 1505 [Urine:875; Blood:50; Chest Tube:580] Intake/Output this shift:    General appearance: alert and cooperative Neurologic: intact Heart: regular rate and rhythm, S1, S2 normal, no murmur, click, rub or gallop Lungs: diminished breath sounds bibasilar chest tube output serous.  Lab Results:  Recent Labs  08/22/15 0357  WBC 12.4*  HGB 9.9*  HCT 33.7*  PLT 214   BMET:  Recent Labs  08/22/15 0357  NA 142  K 3.5  CL 109  CO2 26  GLUCOSE 125*  BUN 15  CREATININE 1.96*  CALCIUM 8.9    PT/INR:  Recent Labs  08/21/15 0621  LABPROT 17.4*  INR 1.42   ABG    Component Value Date/Time   PHART 7.511* 08/12/2015 1147   HCO3 26.3* 08/12/2015 1147   TCO2 27.3 08/12/2015 1147   ACIDBASEDEF 2.0 02/13/2015 0945   O2SAT 97.0 08/12/2015 1147   CBG (last 3)   Recent Labs  08/21/15 1931 08/21/15 2349 08/22/15 0339  GLUCAP 107* 123* 111*    Assessment/Plan: S/P Procedure(s) (LRB): EPICARDIAL PACING LEAD PLACEMENT (N/A) MINI/LIMITED THORACOTOMY (Left)  He is hemodynamically stable. Will resume Norvasc, Coreg, Demadex. Resume Imdur and  hydralazine as BP allows.  He has been on coumadin chronically for prior stroke. Will resume that tomorrow. CT to water seal and may remove later today. Work on IS Ambulate DC a-line and foley   LOS: 1 day    Frank Mclean 08/22/2015

## 2015-08-23 ENCOUNTER — Inpatient Hospital Stay (HOSPITAL_COMMUNITY): Payer: Medicare Other

## 2015-08-23 LAB — GLUCOSE, CAPILLARY
GLUCOSE-CAPILLARY: 91 mg/dL (ref 65–99)
Glucose-Capillary: 102 mg/dL — ABNORMAL HIGH (ref 65–99)
Glucose-Capillary: 91 mg/dL (ref 65–99)
Glucose-Capillary: 87 mg/dL (ref 65–99)
Glucose-Capillary: 103 mg/dL — ABNORMAL HIGH (ref 65–99)

## 2015-08-23 LAB — BASIC METABOLIC PANEL
Anion gap: 5 (ref 5–15)
BUN: 18 mg/dL (ref 6–20)
CALCIUM: 8.6 mg/dL — AB (ref 8.9–10.3)
CO2: 29 mmol/L (ref 22–32)
Chloride: 107 mmol/L (ref 101–111)
Creatinine, Ser: 2.19 mg/dL — ABNORMAL HIGH (ref 0.61–1.24)
GFR calc Af Amer: 32 mL/min — ABNORMAL LOW (ref >60)
GFR, EST NON AFRICAN AMERICAN: 28 mL/min — AB (ref >60)
GLUCOSE: 83 mg/dL (ref 65–99)
Potassium: 3.8 mmol/L (ref 3.5–5.1)
Sodium: 141 mmol/L (ref 135–145)

## 2015-08-23 LAB — PROTIME-INR
INR: 1.64 — AB (ref 0.00–1.49)
PROTHROMBIN TIME: 19.5 s — AB (ref 11.6–15.2)

## 2015-08-23 MED ORDER — GLIPIZIDE 2.5 MG HALF TABLET
2.5000 mg | ORAL_TABLET | Freq: Two times a day (BID) | ORAL | Status: DC
  Administered 2015-08-23 – 2015-08-26 (×6): 2.5 mg via ORAL
  Filled 2015-08-23 (×10): qty 1

## 2015-08-23 MED ORDER — WARFARIN SODIUM 5 MG PO TABS
5.0000 mg | ORAL_TABLET | Freq: Once | ORAL | Status: AC
  Administered 2015-08-23: 5 mg via ORAL
  Filled 2015-08-23: qty 1

## 2015-08-23 MED ORDER — WARFARIN - PHYSICIAN DOSING INPATIENT: Freq: Every day | Status: DC

## 2015-08-23 MED ORDER — INSULIN ASPART 100 UNIT/ML ~~LOC~~ SOLN: 0.0000 [IU] | Freq: Three times a day (TID) | SUBCUTANEOUS | Status: DC

## 2015-08-23 NOTE — Progress Notes (Signed)
Transfer report received from 2S at 0950 and pt arrived to the unit at 1030 via wheelchair with foley intact and unclamped; VSS; telemetry applied and verified; pt alert and verbally responsive; pt oriented to the unit and room with call light within reach. Pt chest incision dsg clean dry and intact. No active drainage or discharge noted. Will closely monitor pt. Arabella Merles Evian Derringer RN.

## 2015-08-23 NOTE — Evaluation (Signed)
Physical Therapy Evaluation Patient Details Name: Frank Mclean MRN: [id number] DOB: 1941-08-25 Today's Date: 08/23/2015   History of Present Illness  Patient is a 74 yo male admitted 08/21/15 with SOB and LE edema.  Patient with CHF.  On 08/21/15, patient had placement of LV epicardial leads via small Lt thoracotomy.  PMH:  NICM, EF 20%, CHF, CAD, HTN, ICD, CVA, PVD, DJD, DM, PAF, LBBB   Clinical Impression  Patient presents with problems listed below.  Will benefit from acute PT to maximize functional mobility prior to discharge home with wife.    Follow Up Recommendations Home health PT;Supervision/Assistance - 24 hour    Equipment Recommendations  None recommended by PT    Recommendations for Other Services       Precautions / Restrictions Precautions Precautions: Fall Restrictions Weight Bearing Restrictions: No      Mobility  Bed Mobility Overal bed mobility: Needs Assistance Bed Mobility: Supine to Sit;Sit to Supine     Supine to sit: Supervision Sit to supine: Supervision   General bed mobility comments: Supervision for safety.  Transfers Overall transfer level: Needs assistance Equipment used: Rolling walker (2 wheeled) Transfers: Sit to/from Stand Sit to Stand: Min guard         General transfer comment: Verbal cues for hand placement and technique.  Cues to wait until equipment ready before standing.  Assist for balance/safety.  Ambulation/Gait Ambulation/Gait assistance: Min assist Ambulation Distance (Feet): 24 Feet Assistive device: Rolling walker (2 wheeled) Gait Pattern/deviations: Step-through pattern;Decreased stride length;Trunk flexed Gait velocity: decreased Gait velocity interpretation: Below normal speed for age/gender General Gait Details: Verbal cues for safe use of RW.  Patient initially attempted to step around RW, though instructed on its use, and foley bag attached to it.  Cues to use RW for balance/safety.  Assist for balance and safety  during turns with RW.  Somewhat unsteady gait even with RW.  Stairs            Wheelchair Mobility    Modified Rankin (Stroke Patients Only)       Balance                                             Pertinent Vitals/Pain Pain Assessment: No/denies pain    Home Living Family/patient expects to be discharged to:: Private residence Living Arrangements: Spouse/significant other Available Help at Discharge: Family;Available 24 hours/day Type of Home: House Home Access: Stairs to enter Entrance Stairs-Rails: Left Entrance Stairs-Number of Steps: 5-6 Home Layout: One level Home Equipment: Walker - 2 wheels;Cane - quad;Shower seat - built in;Wheelchair - manual      Prior Function Level of Independence: Independent with assistive device(s);Needs assistance   Gait / Transfers Assistance Needed: Ambulates with quad cane  ADL's / Homemaking Assistance Needed: Mod I for bathing/dressing except needs assist for socks/shoes        Hand Dominance   Dominant Hand: Right    Extremity/Trunk Assessment   Upper Extremity Assessment: Generalized weakness           Lower Extremity Assessment: Generalized weakness         Communication   Communication: Expressive difficulties (Slightly slurred speech.  Difficult to understand at times)  Cognition Arousal/Alertness: Awake/alert Behavior During Therapy: [facility] for tasks assessed/performed;Impulsive Overall Cognitive Status: No family/caregiver present to determine baseline cognitive functioning (Able to provide PLOF and home information)  General Comments      Exercises        Assessment/Plan    PT Assessment Patient needs continued PT services  PT Diagnosis Difficulty walking;Abnormality of gait;Generalized weakness;Altered mental status   PT Problem List Decreased strength;Decreased activity tolerance;Decreased balance;Decreased mobility;Decreased  cognition;Decreased knowledge of use of DME;Decreased safety awareness;Cardiopulmonary status limiting activity  PT Treatment Interventions DME instruction;Gait training;Stair training;Functional mobility training;Therapeutic activities;Cognitive remediation;Patient/family education   PT Goals (Current goals can be found in the Care Plan section) Acute Rehab PT Goals Patient Stated Goal: To be able to go home PT Goal Formulation: With patient Time For Goal Achievement: 08/30/15 Potential to Achieve Goals: Good    Frequency Min 3X/week   Barriers to discharge        Co-evaluation               End of Session Equipment Utilized During Treatment: Gait belt;Oxygen Activity Tolerance: Patient limited by fatigue Patient left: in bed;with call bell/phone within reach;with bed alarm set           Time: 1443-1455 PT Time Calculation (min) (ACUTE ONLY): 12 min   Charges:   PT Evaluation $Initial PT Evaluation Tier I: 1 Procedure     PT G CodesVena Austria 09/19/15, 5:26 PM Durenda Hurt. Renaldo Fiddler, [facility] Acute Rehab Services Pager [phone]

## 2015-08-23 NOTE — Progress Notes (Signed)
Pt extremely confused but answer questions appropriately, easily redirected and follow commands. Pt got oob independently, removed his telemetry wires and was pulling on his foley. Pt informed his brother in-law he was looking for his clothing Pt reoriented to surroundings and room; pt back to bed with and moved to a camera room for closer monitoring; foley remains intact bed alarm on, call light within reach with family at bedside. Dr. Dorris Fetch notified. Will continue to closely monitor. Dionne Bucy RN

## 2015-08-23 NOTE — Progress Notes (Signed)
2 Days Post-Op Procedure(s) (LRB): EPICARDIAL PACING LEAD PLACEMENT (N/A) MINI/LIMITED THORACOTOMY (Left) Subjective: Some incisional pain Denies nausea Requires assistance to mobilize  Objective: Vital signs in last 24 hours: Temp:  [97.7 F (36.5 C)-99.3 F (37.4 C)] 97.7 F (36.5 C) (09/03 0824) Pulse Rate:  [52-64] 62 (09/03 0800) Cardiac Rhythm:  [-] Ventricular paced (09/03 0800) Resp:  [10-20] 15 (09/03 0800) BP: (87-143)/(35-83) 131/81 mmHg (09/03 0800) SpO2:  [90 %-99 %] 98 % (09/03 0800) Arterial Line BP: (113-130)/(56-71) 125/71 mmHg (09/02 1822) Weight:  [206 lb (93.441 kg)] 206 lb (93.441 kg) (09/03 0600)  Hemodynamic parameters for last 24 hours:    Intake/Output from previous day: 09/02 0701 - 09/03 0700 In: 1780 [P.O.:580; I.V.:1200] Out: 630 [Urine:550; Chest Tube:80] Intake/Output this shift: Total I/O In: 50 [I.V.:50] Out: -   General appearance: alert, cooperative and no distress Neurologic: intact Heart: slightly irregular Lungs: diminished breath sounds bibasilar Wound: clean and dry  Lab Results:  Recent Labs  08/22/15 0357  WBC 12.4*  HGB 9.9*  HCT 33.7*  PLT 214   BMET:  Recent Labs  08/22/15 0357 08/23/15 0439  NA 142 141  K 3.5 3.8  CL 109 107  CO2 26 29  GLUCOSE 125* 83  BUN 15 18  CREATININE 1.96* 2.19*  CALCIUM 8.9 8.6*    PT/INR:  Recent Labs  08/23/15 0439  LABPROT 19.5*  INR 1.64*   ABG    Component Value Date/Time   PHART 7.511* 08/12/2015 1147   HCO3 26.3* 08/12/2015 1147   TCO2 27.3 08/12/2015 1147   ACIDBASEDEF 2.0 02/13/2015 0945   O2SAT 97.0 08/12/2015 1147   CBG (last 3)   Recent Labs  08/22/15 2009 08/22/15 2321 08/23/15 0349  GLUCAP 112* 141* 102*    Assessment/Plan: S/P Procedure(s) (LRB): EPICARDIAL PACING LEAD PLACEMENT (N/A) MINI/LIMITED THORACOTOMY (Left) -  POD # 2  CV- stable, paced  Restart coumadin  RESP_ Bibasilar atelectasis- IS  RENAL/ GU- creatinine up slightly  2.19 baseline= 2  Unable to void last night- straight cathed at midnight- 500 ml  Hasn't voided this AM- will check bladder scan, may need foley replaced  ENDO- CBG Ok  Transfer to 2S when bed available   LOS: 2 days    Loreli Slot 08/23/2015

## 2015-08-24 LAB — BASIC METABOLIC PANEL
ANION GAP: 10 (ref 5–15)
BUN: 15 mg/dL (ref 6–20)
CALCIUM: 9.1 mg/dL (ref 8.9–10.3)
CO2: 27 mmol/L (ref 22–32)
CREATININE: 1.89 mg/dL — AB (ref 0.61–1.24)
Chloride: 107 mmol/L (ref 101–111)
GFR, EST AFRICAN AMERICAN: 38 mL/min — AB (ref >60)
GFR, EST NON AFRICAN AMERICAN: 33 mL/min — AB (ref >60)
GLUCOSE: 101 mg/dL — AB (ref 65–99)
Potassium: 2.8 mmol/L — ABNORMAL LOW (ref 3.5–5.1)
Sodium: 144 mmol/L (ref 135–145)

## 2015-08-24 LAB — GLUCOSE, CAPILLARY
GLUCOSE-CAPILLARY: 100 mg/dL — AB (ref 65–99)
Glucose-Capillary: 98 mg/dL (ref 65–99)
Glucose-Capillary: 93 mg/dL (ref 65–99)

## 2015-08-24 LAB — PROTIME-INR
INR: 1.47 (ref 0.00–1.49)
PROTHROMBIN TIME: 17.9 s — AB (ref 11.6–15.2)

## 2015-08-24 MED ORDER — POTASSIUM CHLORIDE CRYS ER 20 MEQ PO TBCR
40.0000 meq | EXTENDED_RELEASE_TABLET | Freq: Two times a day (BID) | ORAL | Status: AC
  Administered 2015-08-24 (×2): 40 meq via ORAL
  Filled 2015-08-24 (×2): qty 2

## 2015-08-24 MED ORDER — WARFARIN SODIUM 5 MG PO TABS
5.0000 mg | ORAL_TABLET | Freq: Once | ORAL | Status: AC
  Administered 2015-08-24: 5 mg via ORAL
  Filled 2015-08-24: qty 1

## 2015-08-24 MED ORDER — ISOSORBIDE MONONITRATE ER 60 MG PO TB24
60.0000 mg | ORAL_TABLET | Freq: Every day | ORAL | Status: DC
  Administered 2015-08-24 – 2015-08-26 (×3): 60 mg via ORAL
  Filled 2015-08-24 (×4): qty 1

## 2015-08-24 NOTE — Progress Notes (Signed)
Patient's foley catheter was removed around 1310.  At around 1440, it was seen that the patient had been bleeding from his meatus.  When the patient got up to void, a couple of small clots were noticed.  Dr. Dorris Fetch has been paged.  Will continue to monitor patient. Lazaro Arms E,RN

## 2015-08-24 NOTE — Progress Notes (Signed)
Patient ambulated another 150 feet in the hall way with the walker.  Tolerated activity well.  Will continue to monitor. Frank Mclean

## 2015-08-24 NOTE — Progress Notes (Addendum)
[address]       [name] [address]             [phone]      3 Days Post-Op Procedure(s) (LRB): EPICARDIAL PACING LEAD PLACEMENT (N/A) MINI/LIMITED THORACOTOMY (Left) Subjective: Looks and fels pretty well, confusion has cleared. + productive cough  Objective: Vital signs in last 24 hours: Temp:  [98 F (36.7 C)-99.3 F (37.4 C)] 98.8 F (37.1 C) (09/04 0511) Pulse Rate:  [63-72] 69 (09/04 0511) Cardiac Rhythm:  [-] Ventricular paced;Atrial fibrillation;Bundle branch block;Other (Comment) (09/04 0708) Resp:  [18] 18 (09/04 0511) BP: (129-158)/(63-93) 138/93 mmHg (09/04 0511) SpO2:  [93 %-100 %] 93 % (09/04 0851)  Hemodynamic parameters for last 24 hours:    Intake/Output from previous day: 09/03 0701 - 09/04 0700 In: 340 [P.O.:240; I.V.:100] Out: 3000 [Urine:3000] Intake/Output this shift:    General appearance: alert, cooperative and no distress Heart: irregularly irregular rhythm Lungs: mild coarseness throughout Abdomen: benign Extremities: trace edema Wound: incis healing well  Lab Results:  Recent Labs  08/22/15 0357  WBC 12.4*  HGB 9.9*  HCT 33.7*  PLT 214   BMET:  Recent Labs  08/23/15 0439 08/24/15 0510  NA 141 144  K 3.8 2.8*  CL 107 107  CO2 29 27  GLUCOSE 83 101*  BUN 18 15  CREATININE 2.19* 1.89*  CALCIUM 8.6* 9.1    PT/INR:  Recent Labs  08/24/15 0510  LABPROT 17.9*  INR 1.47   ABG    Component Value Date/Time   PHART 7.511* 08/12/2015 1147   HCO3 26.3* 08/12/2015 1147   TCO2 27.3 08/12/2015 1147   ACIDBASEDEF 2.0 02/13/2015 0945   O2SAT 97.0 08/12/2015 1147   CBG (last 3)   Recent Labs  08/23/15 1627 08/23/15 2109 08/24/15 0634  GLUCAP 87 103* 98    Meds Scheduled Meds: . acetaminophen  1,000 mg Oral 4 times per day   Or  . acetaminophen (TYLENOL) oral liquid 160 mg/5 mL  1,000 mg Oral 4 times per day  . amLODipine  10 mg Oral Daily  . bisacodyl  10 mg Oral Daily  .  budesonide-formoterol  2 puff Inhalation BID  . carvedilol  6.25 mg Oral BID WC  . escitalopram  5 mg Oral Daily  . glipiZIDE  2.5 mg Oral BID AC  . insulin aspart  0-9 Units Subcutaneous TID WC  . levothyroxine  100 mcg Oral QAC breakfast  . senna-docusate  1 tablet Oral QHS  . sodium chloride  10-40 mL Intracatheter Q12H  . torsemide  20 mg Oral Daily  . Warfarin - Physician Dosing Inpatient   Does not apply q1800   Continuous Infusions:  PRN Meds:.levalbuterol, ondansetron (ZOFRAN) IV, oxyCODONE, potassium chloride, sodium chloride, traMADol  Xrays Dg Chest Port 1 View  08/23/2015   CLINICAL DATA:  Status post thoracotomy.  EXAM: PORTABLE CHEST - 1 VIEW  COMPARISON:  08/22/2015  FINDINGS: Right IJ central line tip to the superior vena cava. Left-sided AICD leads to the right atrium and right ventricle. Left chest tube has been removed. No pneumothorax.  Cardiac size is enlarged. There are pulmonary opacities bilaterally, left greater than right. Suspect left pleural effusion. There is increased opacity at the left lung base now barely obscuring the hemidiaphragm.  IMPRESSION: 1. Cardiomegaly and increased left base opacity. 2. Interval removal of left chest tube.  No pneumothorax.   Electronically Signed   By: Norva Pavlov M.D.  On: 08/23/2015 09:26    Assessment/Plan: S/P Procedure(s) (LRB): EPICARDIAL PACING LEAD PLACEMENT (N/A) MINI/LIMITED THORACOTOMY (Left)  1 improving overall 2 cont routine pulm toilet 3 voiding well with good UO, creat has improved K+ is low at 2.8, will replace 4 resume imdur dor HTN, hold on resuming hydralazine for now 5 cont coumadin for previous CVA 6 repeat labs in am 7 ambulate as able 8 paced rhythm with frequent ventric ectopy  LOS: 3 days    GOLD,WAYNE E 08/24/2015  Patient seen and examined, agree with above Will dc foley Recheck labs in AM  [place] C. Dorris Fetch, MD [facility] [phone]

## 2015-08-24 NOTE — Progress Notes (Signed)
Patient ambulated 150 feet with walker.  Tolerated activity well.  Will continue to monitor. Harriet Masson

## 2015-08-25 LAB — BASIC METABOLIC PANEL
ANION GAP: 11 (ref 5–15)
BUN: 14 mg/dL (ref 6–20)
CALCIUM: 8.9 mg/dL (ref 8.9–10.3)
CO2: 28 mmol/L (ref 22–32)
Chloride: 107 mmol/L (ref 101–111)
Creatinine, Ser: 1.74 mg/dL — ABNORMAL HIGH (ref 0.61–1.24)
GFR, EST AFRICAN AMERICAN: 42 mL/min — AB (ref >60)
GFR, EST NON AFRICAN AMERICAN: 36 mL/min — AB (ref >60)
Glucose, Bld: 82 mg/dL (ref 65–99)
Potassium: 3.3 mmol/L — ABNORMAL LOW (ref 3.5–5.1)
Sodium: 146 mmol/L — ABNORMAL HIGH (ref 135–145)

## 2015-08-25 LAB — PROTIME-INR
INR: 1.52 — ABNORMAL HIGH (ref 0.00–1.49)
PROTHROMBIN TIME: 18.4 s — AB (ref 11.6–15.2)

## 2015-08-25 LAB — GLUCOSE, CAPILLARY
GLUCOSE-CAPILLARY: 85 mg/dL (ref 65–99)
GLUCOSE-CAPILLARY: 98 mg/dL (ref 65–99)
GLUCOSE-CAPILLARY: 85 mg/dL (ref 65–99)
Glucose-Capillary: 91 mg/dL (ref 65–99)

## 2015-08-25 MED ORDER — WARFARIN SODIUM 5 MG PO TABS
5.0000 mg | ORAL_TABLET | Freq: Once | ORAL | Status: AC
  Administered 2015-08-25: 5 mg via ORAL
  Filled 2015-08-25: qty 1

## 2015-08-25 MED ORDER — POTASSIUM CHLORIDE CRYS ER 20 MEQ PO TBCR
40.0000 meq | EXTENDED_RELEASE_TABLET | Freq: Two times a day (BID) | ORAL | Status: AC
  Administered 2015-08-25 (×2): 40 meq via ORAL
  Filled 2015-08-25 (×2): qty 2

## 2015-08-25 MED ORDER — HYDRALAZINE HCL 25 MG PO TABS
25.0000 mg | ORAL_TABLET | Freq: Three times a day (TID) | ORAL | Status: DC
  Administered 2015-08-25 – 2015-08-26 (×3): 25 mg via ORAL
  Filled 2015-08-25 (×6): qty 1

## 2015-08-25 NOTE — Care Management Important Message (Signed)
Important Message  Patient Details  Name: EZANA BLOMME MRN: [id number] Date of Birth: May 16, 1941   Medicare Important Message Given:  Yes-second notification given    Bernadette Hoit 08/25/2015, 8:49 AM

## 2015-08-25 NOTE — Progress Notes (Signed)
      [address]       [place] [address]             [phone]      4 Days Post-Op Procedure(s) (LRB): EPICARDIAL PACING LEAD PLACEMENT (N/A) MINI/LIMITED THORACOTOMY (Left)   Subjective:  No new complaints.  Confusion is resolving.  Cough remains productive  Objective: Vital signs in last 24 hours: Temp:  [97.9 F (36.6 C)-98.8 F (37.1 C)] 97.9 F (36.6 C) (09/05 0429) Pulse Rate:  [60-88] 60 (09/05 0840) Cardiac Rhythm:  [-] Ventricular paced;Normal sinus rhythm (09/05 0710) Resp:  [16-17] 16 (09/05 0840) BP: (119-148)/(74-91) 148/84 mmHg (09/05 0429) SpO2:  [95 %-97 %] 95 % (09/05 0840)  Intake/Output from previous day: 09/04 0701 - 09/05 0700 In: 400 [P.O.:400] Out: 1150 [Urine:1150]  General appearance: alert, cooperative and no distress Heart: irregularly irregular rhythm Lungs: coarse, clears with cough Abdomen: soft, non-tender; bowel sounds normal; no masses,  no organomegaly Wound: clean and dry  Lab Results: No results for input(s): WBC, HGB, HCT, PLT in the last 72 hours. BMET:  Recent Labs  08/24/15 0510 08/25/15 0424  NA 144 146*  K 2.8* 3.3*  CL 107 107  CO2 27 28  GLUCOSE 101* 82  BUN 15 14  CREATININE 1.89* 1.74*  CALCIUM 9.1 8.9    PT/INR:  Recent Labs  08/25/15 0424  LABPROT 18.4*  INR 1.52*   ABG    Component Value Date/Time   PHART 7.511* 08/12/2015 1147   HCO3 26.3* 08/12/2015 1147   TCO2 27.3 08/12/2015 1147   ACIDBASEDEF 2.0 02/13/2015 0945   O2SAT 97.0 08/12/2015 1147   CBG (last 3)   Recent Labs  08/24/15 1146 08/24/15 1636 08/25/15 0617  GLUCAP 100* 93 85    Assessment/Plan: S/P Procedure(s) (LRB): EPICARDIAL PACING LEAD PLACEMENT (N/A) MINI/LIMITED THORACOTOMY (Left)  1. CV- hemodynamically stable, pressure remains high at times, will resume hydralazine at reduced dose 2. Pulm- continued productive cough, not on oxygen, continue IS 3. Renal-potassium improved some, remains low at  3.3 continue supplements, creatinine continues to trend down 4. INR 1.51, continue home coumadin for previous stroke 5. Dispo- patient stable, continues to improve, continue to supplement potassium, will restart Hydralazine at reduced dose if remains stable, possibly home in AM   LOS: 4 days    Frank Mclean 08/25/2015

## 2015-08-25 NOTE — Progress Notes (Signed)
Physical Therapy Treatment Patient Details Name: Frank Mclean MRN: [id number] DOB: 10/10/1941 Today's Date: 08/25/2015    History of Present Illness Patient is a 74 yo male admitted 08/21/15 with SOB and LE edema.  Patient with CHF.  On 08/21/15, patient had placement of LV epicardial leads via small Lt thoracotomy.  PMH:  NICM, EF 20%, CHF, CAD, HTN, ICD, CVA, PVD, DJD, DM, PAF, LBBB     PT Comments    Pt progressing towards physical therapy goals. He wanted to try using the QC for mobility today instead of the RW. Pt was able to walk fairly well with QC only however fatigued quickly, requiring HHA in addition to QC for walk back to room. RW was given for mobility to/from bathroom. Discussed energy conservation tips and recommended continued use of RW until he is back to baseline. Will continue to follow.   Follow Up Recommendations  Home health PT;Supervision/Assistance - 24 hour     Equipment Recommendations  None recommended by PT    Recommendations for Other Services       Precautions / Restrictions Precautions Precautions: Fall Restrictions Weight Bearing Restrictions: No    Mobility  Bed Mobility Overal bed mobility: Needs Assistance Bed Mobility: Supine to Sit     Supine to sit: Supervision     General bed mobility comments: Supervision for safety.  Transfers Overall transfer level: Needs assistance Equipment used: Rolling walker (2 wheeled) Transfers: Sit to/from Stand Sit to Stand: Min guard         General transfer comment: Verbal cues for hand placement and technique.  Cues to wait until equipment ready before standing.  Assist for balance/safety. Decreased balance with QC vs. RW. Min A provided with QC to get back to room, and pt used RW to mobilize around room to/from bathroom.   Ambulation/Gait Ambulation/Gait assistance: Min assist Ambulation Distance (Feet): 125 Feet Assistive device: Rolling walker (2 wheeled);Quad cane Gait Pattern/deviations:  Step-through pattern;Decreased stride length;Trunk flexed Gait velocity: decreased Gait velocity interpretation: Below normal speed for age/gender General Gait Details: Unsteady with ambulation. Pt used QC in hall and initially did well, however as pt fatigued    Stairs            Wheelchair Mobility    Modified Rankin (Stroke Patients Only)       Balance Overall balance assessment: Needs assistance Sitting-balance support: Feet supported;No upper extremity supported Sitting balance-Leahy Scale: Fair     Standing balance support: Bilateral upper extremity supported;During functional activity Standing balance-Leahy Scale: Fair                      Cognition Arousal/Alertness: Awake/alert Behavior During Therapy: WFL for tasks assessed/performed Overall Cognitive Status: Within Functional Limits for tasks assessed                      Exercises General Exercises - Lower Extremity Long Arc Quad: 10 reps Hip ABduction/ADduction: 10 reps    General Comments        Pertinent Vitals/Pain Pain Assessment: No/denies pain    Home Living                      Prior Function            PT Goals (current goals can now be found in the care plan section) Acute Rehab PT Goals Patient Stated Goal: To be able to go home PT Goal Formulation: With patient Time For  Goal Achievement: 08/30/15 Potential to Achieve Goals: Good Progress towards PT goals: Progressing toward goals    Frequency  Min 3X/week    PT Plan Current plan remains appropriate    Co-evaluation             End of Session Equipment Utilized During Treatment: Gait belt Activity Tolerance: Patient limited by fatigue Patient left: with call bell/phone within reach;in chair     Time: 7588-3254 PT Time Calculation (min) (ACUTE ONLY): 26 min  Charges:  $Gait Training: 8-22 mins $Therapeutic Activity: 8-22 mins                    G Codes:      Conni Slipper 09-03-15, 10:48 AM  Conni Slipper, PT, DPT Acute Rehabilitation Services Pager: [phone]

## 2015-08-26 LAB — BASIC METABOLIC PANEL
Anion gap: 9 (ref 5–15)
BUN: 11 mg/dL (ref 6–20)
CHLORIDE: 109 mmol/L (ref 101–111)
CO2: 28 mmol/L (ref 22–32)
Calcium: 9 mg/dL (ref 8.9–10.3)
Creatinine, Ser: 1.67 mg/dL — ABNORMAL HIGH (ref 0.61–1.24)
GFR calc Af Amer: 44 mL/min — ABNORMAL LOW (ref >60)
GFR calc non Af Amer: 38 mL/min — ABNORMAL LOW (ref >60)
GLUCOSE: 89 mg/dL (ref 65–99)
POTASSIUM: 3.3 mmol/L — AB (ref 3.5–5.1)
Sodium: 146 mmol/L — ABNORMAL HIGH (ref 135–145)

## 2015-08-26 LAB — PROTIME-INR
INR: 1.62 — ABNORMAL HIGH (ref 0.00–1.49)
Prothrombin Time: 19.2 seconds — ABNORMAL HIGH (ref 11.6–15.2)

## 2015-08-26 LAB — GLUCOSE, CAPILLARY
Glucose-Capillary: 93 mg/dL (ref 65–99)
Glucose-Capillary: 100 mg/dL — ABNORMAL HIGH (ref 65–99)

## 2015-08-26 MED ORDER — TAMSULOSIN HCL 0.4 MG PO CAPS: 0.4000 mg | ORAL_CAPSULE | Freq: Every day | ORAL | Status: DC

## 2015-08-26 MED ORDER — POTASSIUM CHLORIDE 10 MEQ/100ML IV SOLN: 10.0000 meq | INTRAVENOUS | Status: DC

## 2015-08-26 MED ORDER — POTASSIUM CHLORIDE CRYS ER 20 MEQ PO TBCR
40.0000 meq | EXTENDED_RELEASE_TABLET | Freq: Once | ORAL | Status: AC
  Administered 2015-08-26: 40 meq via ORAL
  Filled 2015-08-26: qty 2

## 2015-08-26 MED ORDER — TAMSULOSIN HCL 0.4 MG PO CAPS
0.4000 mg | ORAL_CAPSULE | Freq: Every day | ORAL | Status: DC
  Administered 2015-08-26: 0.4 mg via ORAL
  Filled 2015-08-26: qty 1

## 2015-08-26 NOTE — Anesthesia Postprocedure Evaluation (Signed)
  Anesthesia Post-op Note  Patient: Frank Mclean  Procedure(s) Performed: Procedure(s): EPICARDIAL PACING LEAD PLACEMENT (N/A) MINI/LIMITED THORACOTOMY (Left)  Patient Location: PACU  Anesthesia Type:General  Level of Consciousness: awake  Airway and Oxygen Therapy: Patient Spontanous Breathing and Patient connected to nasal cannula oxygen  Post-op Pain: mild  Post-op Assessment: Post-op Vital signs reviewed, Patient's Cardiovascular Status Stable, Respiratory Function Stable, Patent Airway, No signs of Nausea or vomiting and Pain level controlled              Post-op Vital Signs: Reviewed and stable  Last Vitals:  Filed Vitals:   08/26/15 0820  BP: 151/86  Pulse: 71  Temp:   Resp: 18    Complications: No apparent anesthesia complications

## 2015-08-26 NOTE — Progress Notes (Signed)
Pt discharge education and instructions completed with pt and family at bedside; all voices understanding and denies any questions. Pt IV and telemetry removed; pt discharge home with spouse to transport him home. Pt handed his prescription for Flomax; pt suture removed as ordered; pt discharge home with foley intact, unclamped and draining. Pt given his foley leg bag to take home with him as family refuses education on foley care and maintenance; pt wife said home health will be taking care of that and since they weren't sure pt home health will begin today they didn't want the foley bag to be changed to leg bag. Pt left flank incision clean, dry and intact with skin glue open to air. No active, drainage, discharge or bleeding noted. Pt transported off unit via wheelchair with belongings to the side with family. Pt cleaned and changed into his personal clothing to go home. Dionne Bucy RN

## 2015-08-26 NOTE — Care Management Note (Signed)
Case Management Note Donn Pierini RN, BSN Unit 2W-Case Manager [phone]  Patient Details  Name: Frank Mclean MRN: [id number] Date of Birth: 12/13/1941  Subjective/Objective:    Pt admitted s/p VATS                Action/Plan: PTA pt lived at home with spouse- PT eval recommendation for [facility] therapy- pt also going home with foley- orders for HH-RN/PT- spoke with wife- who states that pt has had HH with [facility] in the past  - states that she would like to use them again- referral called to [facility]- spoke with Elnita Maxwell who accepted referral- orders and documentation faxed to [facility]- [phone]-    Expected Discharge Date:       08/26/15           Expected Discharge Plan:  Home w Home Health Services  In-House Referral:     Discharge planning Services  CM Consult  Post Acute Care Choice:  Home Health Choice offered to:  Spouse  DME Arranged:  N/A DME Agency:  NA  HH Arranged:  RN, PT HH Agency:  [facility]  Status of Service:  Completed, signed off  Medicare Important Message Given:  Yes-second notification given Date Medicare IM Given:    Medicare IM give by:    Date Additional Medicare IM Given:    Additional Medicare Important Message give by:     If discussed at Long Length of Stay Meetings, dates discussed:  08/26/15  Additional Comments:  Darrold Span, RN 08/26/2015, 11:37 AM

## 2015-08-26 NOTE — Progress Notes (Addendum)
      [address]       [name] [address]             [phone]      5 Days Post-Op Procedure(s) (LRB): EPICARDIAL PACING LEAD PLACEMENT (N/A) MINI/LIMITED THORACOTOMY (Left)   Subjective:  Frank Mclean complains of only being able to void small amounts.  He states that the past 3 times he went to the bathroom he only voided 100 cc of output last 3 occurrences.  He denies hematuria and dysuria, but states his bladder still feels full after voiding.  He denies previous prostate problems.  Objective: Vital signs in last 24 hours: Temp:  [98 F (36.7 C)-99 F (37.2 C)] 98.2 F (36.8 C) (09/06 0358) Pulse Rate:  [60-66] 66 (09/06 0358) Cardiac Rhythm:  [-] A-V Sequential paced (09/05 1932) Resp:  [16-17] 16 (09/06 0358) BP: (121-151)/(79-97) 151/97 mmHg (09/06 0358) SpO2:  [95 %-99 %] 96 % (09/06 0358)  Intake/Output from previous day: 09/05 0701 - 09/06 0700 In: -  Out: 300 [Urine:300]  General appearance: alert, cooperative and no distress Heart: regular rate and rhythm Lungs: clear to auscultation bilaterally Abdomen: soft, non-tender; bowel sounds normal; no masses,  no organomegaly Wound: clean and dry  Lab Results: No results for input(s): WBC, HGB, HCT, PLT in the last 72 hours. BMET:  Recent Labs  08/25/15 0424 08/26/15 0420  NA 146* 146*  K 3.3* 3.3*  CL 107 109  CO2 28 28  GLUCOSE 82 89  BUN 14 11  CREATININE 1.74* 1.67*  CALCIUM 8.9 9.0    PT/INR:  Recent Labs  08/26/15 0420  LABPROT 19.2*  INR 1.62*   ABG    Component Value Date/Time   PHART 7.511* 08/12/2015 1147   HCO3 26.3* 08/12/2015 1147   TCO2 27.3 08/12/2015 1147   ACIDBASEDEF 2.0 02/13/2015 0945   O2SAT 97.0 08/12/2015 1147   CBG (last 3)   Recent Labs  08/25/15 1626 08/25/15 2100 08/26/15 0604  GLUCAP 85 91 93    Assessment/Plan: S/P Procedure(s) (LRB): EPICARDIAL PACING LEAD PLACEMENT (N/A) MINI/LIMITED THORACOTOMY (Left)  1. CV- hemodynamically  stable, pressure remains high- on Norvasc, Coreg, Imdur, and Hydralazine at reduced dose 2. Pulm- no acute issues, continue IS 3. Renal- creatinine trending down, potassium remains low at 3.3, continue potassium supplementation 4. GU- urinary retention, will get bladder scan, start Flomax- discuss further management with staff 5. INR 1.62, continue Coumadin at 5 mg daily 6. Dispo- patient wants to go home, however need to address urinary issues prior to discharge   LOS: 5 days    Frank Mclean 08/26/2015   Chart reviewed, patient examined, agree with above. Foley reinserted for urinary retention. He is otherwise doing well and his wife feels that his breathing is improved. He is going home today with foley to be followed up by urology.

## 2015-08-27 ENCOUNTER — Other Ambulatory Visit: Payer: Self-pay | Admitting: Cardiology

## 2015-08-27 ENCOUNTER — Other Ambulatory Visit: Payer: Self-pay | Admitting: Internal Medicine

## 2015-08-27 DIAGNOSIS — R339 Retention of urine, unspecified: Secondary | ICD-10-CM | POA: Diagnosis not present

## 2015-08-27 DIAGNOSIS — I129 Hypertensive chronic kidney disease with stage 1 through stage 4 chronic kidney disease, or unspecified chronic kidney disease: Secondary | ICD-10-CM | POA: Diagnosis not present

## 2015-08-27 DIAGNOSIS — I5043 Acute on chronic combined systolic (congestive) and diastolic (congestive) heart failure: Secondary | ICD-10-CM | POA: Diagnosis not present

## 2015-08-27 DIAGNOSIS — F329 Major depressive disorder, single episode, unspecified: Secondary | ICD-10-CM | POA: Diagnosis not present

## 2015-08-27 DIAGNOSIS — Z48812 Encounter for surgical aftercare following surgery on the circulatory system: Secondary | ICD-10-CM | POA: Diagnosis not present

## 2015-08-27 DIAGNOSIS — N183 Chronic kidney disease, stage 3 (moderate): Secondary | ICD-10-CM | POA: Diagnosis not present

## 2015-08-27 DIAGNOSIS — I2510 Atherosclerotic heart disease of native coronary artery without angina pectoris: Secondary | ICD-10-CM | POA: Diagnosis not present

## 2015-08-27 DIAGNOSIS — I10 Essential (primary) hypertension: Secondary | ICD-10-CM | POA: Diagnosis not present

## 2015-08-27 DIAGNOSIS — M6281 Muscle weakness (generalized): Secondary | ICD-10-CM | POA: Diagnosis not present

## 2015-08-27 DIAGNOSIS — E1151 Type 2 diabetes mellitus with diabetic peripheral angiopathy without gangrene: Secondary | ICD-10-CM | POA: Diagnosis not present

## 2015-08-27 DIAGNOSIS — I480 Paroxysmal atrial fibrillation: Secondary | ICD-10-CM | POA: Diagnosis not present

## 2015-08-27 DIAGNOSIS — J449 Chronic obstructive pulmonary disease, unspecified: Secondary | ICD-10-CM | POA: Diagnosis not present

## 2015-09-01 ENCOUNTER — Ambulatory Visit (INDEPENDENT_AMBULATORY_CARE_PROVIDER_SITE_OTHER): Payer: Medicare Other | Admitting: *Deleted

## 2015-09-01 ENCOUNTER — Encounter: Payer: Self-pay | Admitting: Internal Medicine

## 2015-09-01 DIAGNOSIS — I10 Essential (primary) hypertension: Secondary | ICD-10-CM | POA: Diagnosis not present

## 2015-09-01 DIAGNOSIS — I5042 Chronic combined systolic (congestive) and diastolic (congestive) heart failure: Secondary | ICD-10-CM | POA: Diagnosis not present

## 2015-09-01 DIAGNOSIS — Z7901 Long term (current) use of anticoagulants: Secondary | ICD-10-CM

## 2015-09-01 DIAGNOSIS — I6350 Cerebral infarction due to unspecified occlusion or stenosis of unspecified cerebral artery: Secondary | ICD-10-CM

## 2015-09-01 DIAGNOSIS — Z5181 Encounter for therapeutic drug level monitoring: Secondary | ICD-10-CM | POA: Diagnosis not present

## 2015-09-01 DIAGNOSIS — I429 Cardiomyopathy, unspecified: Secondary | ICD-10-CM

## 2015-09-01 DIAGNOSIS — I639 Cerebral infarction, unspecified: Secondary | ICD-10-CM

## 2015-09-01 DIAGNOSIS — I480 Paroxysmal atrial fibrillation: Secondary | ICD-10-CM | POA: Diagnosis not present

## 2015-09-01 DIAGNOSIS — I129 Hypertensive chronic kidney disease with stage 1 through stage 4 chronic kidney disease, or unspecified chronic kidney disease: Secondary | ICD-10-CM | POA: Diagnosis not present

## 2015-09-01 DIAGNOSIS — I5043 Acute on chronic combined systolic (congestive) and diastolic (congestive) heart failure: Secondary | ICD-10-CM | POA: Diagnosis not present

## 2015-09-01 DIAGNOSIS — E1151 Type 2 diabetes mellitus with diabetic peripheral angiopathy without gangrene: Secondary | ICD-10-CM | POA: Diagnosis not present

## 2015-09-01 DIAGNOSIS — J449 Chronic obstructive pulmonary disease, unspecified: Secondary | ICD-10-CM | POA: Diagnosis not present

## 2015-09-01 LAB — CUP PACEART INCLINIC DEVICE CHECK
Brady Statistic AP VP Percent: 32.78 %
Brady Statistic AP VS Percent: 0.64 %
Brady Statistic AS VP Percent: 62.79 %
Brady Statistic RA Percent Paced: 33.42 %
Brady Statistic RV Percent Paced: 3.83 %
Date Time Interrogation Session: 20160912121643
HighPow Impedance: 133 Ohm
HighPow Impedance: 55 Ohm
Lead Channel Impedance Value: 304 Ohm
Lead Channel Impedance Value: 4047 Ohm
Lead Channel Pacing Threshold Amplitude: 0.75 V
Lead Channel Pacing Threshold Amplitude: 1.5 V
Lead Channel Pacing Threshold Pulse Width: 0.4 ms
Lead Channel Sensing Intrinsic Amplitude: 4.125 mV
Lead Channel Setting Pacing Amplitude: 2.5 V
Lead Channel Setting Pacing Pulse Width: 0.4 ms
MDC IDC MSMT BATTERY REMAINING LONGEVITY: 122 mo
MDC IDC MSMT BATTERY VOLTAGE: 3.02 V
MDC IDC MSMT LEADCHNL LV IMPEDANCE VALUE: 4047 Ohm
MDC IDC MSMT LEADCHNL LV PACING THRESHOLD PULSEWIDTH: 0.4 ms
MDC IDC MSMT LEADCHNL RA IMPEDANCE VALUE: 418 Ohm
MDC IDC MSMT LEADCHNL RV IMPEDANCE VALUE: 361 Ohm
MDC IDC MSMT LEADCHNL RV PACING THRESHOLD AMPLITUDE: 0.5 V
MDC IDC MSMT LEADCHNL RV PACING THRESHOLD PULSEWIDTH: 0.4 ms
MDC IDC MSMT LEADCHNL RV SENSING INTR AMPL: 14.125 mV
MDC IDC SET LEADCHNL LV PACING AMPLITUDE: 2.75 V
MDC IDC SET LEADCHNL LV PACING PULSEWIDTH: 0.4 ms
MDC IDC SET LEADCHNL RA PACING AMPLITUDE: 2 V
MDC IDC SET LEADCHNL RV SENSING SENSITIVITY: 0.3 mV
MDC IDC SET ZONE DETECTION INTERVAL: 300 ms
MDC IDC SET ZONE DETECTION INTERVAL: 360 ms
MDC IDC SET ZONE DETECTION INTERVAL: 450 ms
MDC IDC STAT BRADY AS VS PERCENT: 3.8 %
Zone Setting Detection Interval: 350 ms

## 2015-09-01 LAB — POCT INR: INR: 3.5

## 2015-09-01 NOTE — Progress Notes (Signed)
Wound check appointment s/p epicardial LV placement 08/21/15. Wounds without redness or edema. Incision edges approximated, wounds well healed. Normal device function. Thresholds, sensing, and impedances consistent with implant measurements. Device programmed with autocapture on. Histogram distribution appropriate for patient and level of activity. 1 AT/AF episode- 14 hours +warfarin. No ventricular arrhythmias noted. Patient educated about wound care, arm mobility, lifting restrictions, shock plan. ROV 11/26/15 with GT.

## 2015-09-02 DIAGNOSIS — E1151 Type 2 diabetes mellitus with diabetic peripheral angiopathy without gangrene: Secondary | ICD-10-CM | POA: Diagnosis not present

## 2015-09-02 DIAGNOSIS — I129 Hypertensive chronic kidney disease with stage 1 through stage 4 chronic kidney disease, or unspecified chronic kidney disease: Secondary | ICD-10-CM | POA: Diagnosis not present

## 2015-09-02 DIAGNOSIS — I5043 Acute on chronic combined systolic (congestive) and diastolic (congestive) heart failure: Secondary | ICD-10-CM | POA: Diagnosis not present

## 2015-09-02 DIAGNOSIS — J449 Chronic obstructive pulmonary disease, unspecified: Secondary | ICD-10-CM | POA: Diagnosis not present

## 2015-09-02 DIAGNOSIS — I10 Essential (primary) hypertension: Secondary | ICD-10-CM | POA: Diagnosis not present

## 2015-09-02 DIAGNOSIS — I480 Paroxysmal atrial fibrillation: Secondary | ICD-10-CM | POA: Diagnosis not present

## 2015-09-04 DIAGNOSIS — I480 Paroxysmal atrial fibrillation: Secondary | ICD-10-CM | POA: Diagnosis not present

## 2015-09-04 DIAGNOSIS — I129 Hypertensive chronic kidney disease with stage 1 through stage 4 chronic kidney disease, or unspecified chronic kidney disease: Secondary | ICD-10-CM | POA: Diagnosis not present

## 2015-09-04 DIAGNOSIS — I10 Essential (primary) hypertension: Secondary | ICD-10-CM | POA: Diagnosis not present

## 2015-09-04 DIAGNOSIS — E1151 Type 2 diabetes mellitus with diabetic peripheral angiopathy without gangrene: Secondary | ICD-10-CM | POA: Diagnosis not present

## 2015-09-04 DIAGNOSIS — I5043 Acute on chronic combined systolic (congestive) and diastolic (congestive) heart failure: Secondary | ICD-10-CM | POA: Diagnosis not present

## 2015-09-04 DIAGNOSIS — J449 Chronic obstructive pulmonary disease, unspecified: Secondary | ICD-10-CM | POA: Diagnosis not present

## 2015-09-05 DIAGNOSIS — R339 Retention of urine, unspecified: Secondary | ICD-10-CM | POA: Diagnosis not present

## 2015-09-08 ENCOUNTER — Institutional Professional Consult (permissible substitution): Payer: Medicare Other | Admitting: Pulmonary Disease

## 2015-09-08 DIAGNOSIS — I129 Hypertensive chronic kidney disease with stage 1 through stage 4 chronic kidney disease, or unspecified chronic kidney disease: Secondary | ICD-10-CM | POA: Diagnosis not present

## 2015-09-08 DIAGNOSIS — E1151 Type 2 diabetes mellitus with diabetic peripheral angiopathy without gangrene: Secondary | ICD-10-CM | POA: Diagnosis not present

## 2015-09-08 DIAGNOSIS — J449 Chronic obstructive pulmonary disease, unspecified: Secondary | ICD-10-CM | POA: Diagnosis not present

## 2015-09-08 DIAGNOSIS — I5043 Acute on chronic combined systolic (congestive) and diastolic (congestive) heart failure: Secondary | ICD-10-CM | POA: Diagnosis not present

## 2015-09-08 DIAGNOSIS — I480 Paroxysmal atrial fibrillation: Secondary | ICD-10-CM | POA: Diagnosis not present

## 2015-09-08 DIAGNOSIS — I10 Essential (primary) hypertension: Secondary | ICD-10-CM | POA: Diagnosis not present

## 2015-09-10 ENCOUNTER — Encounter: Payer: Self-pay | Admitting: Surgery

## 2015-09-10 ENCOUNTER — Ambulatory Visit (INDEPENDENT_AMBULATORY_CARE_PROVIDER_SITE_OTHER): Payer: Self-pay | Admitting: Surgery

## 2015-09-10 ENCOUNTER — Ambulatory Visit
Admission: RE | Admit: 2015-09-10 | Discharge: 2015-09-10 | Disposition: A | Discharge status: Home / Self Care | Payer: Medicare Other | Source: Ambulatory Visit | Attending: Surgery | Admitting: Surgery

## 2015-09-10 ENCOUNTER — Ambulatory Visit (INDEPENDENT_AMBULATORY_CARE_PROVIDER_SITE_OTHER): Payer: Medicare Other | Admitting: Podiatry

## 2015-09-10 ENCOUNTER — Other Ambulatory Visit: Payer: Self-pay | Admitting: Surgery

## 2015-09-10 VITALS — BP 114/71 | HR 60 | Resp 16 | Ht 74.5 in | Wt 201.0 lb

## 2015-09-10 DIAGNOSIS — I429 Cardiomyopathy, unspecified: Secondary | ICD-10-CM

## 2015-09-10 DIAGNOSIS — Z950 Presence of cardiac pacemaker: Secondary | ICD-10-CM | POA: Diagnosis not present

## 2015-09-10 DIAGNOSIS — I517 Cardiomegaly: Secondary | ICD-10-CM | POA: Diagnosis not present

## 2015-09-10 DIAGNOSIS — M79676 Pain in unspecified toe(s): Secondary | ICD-10-CM

## 2015-09-10 DIAGNOSIS — B351 Tinea unguium: Secondary | ICD-10-CM | POA: Diagnosis not present

## 2015-09-10 DIAGNOSIS — I5042 Chronic combined systolic (congestive) and diastolic (congestive) heart failure: Secondary | ICD-10-CM

## 2015-09-10 NOTE — Progress Notes (Signed)
HPI: Patient returns for routine postoperative follow-up having undergone left thoracotomy for placement of LV epicardial pacing leads on 08/21/2015. [name] early postoperative recovery while in the hospital was notable for an uncomplicated postop course. Since hospital discharge the patient reports that he is feeling better. His wife says that she feels that his breathing is improved and he no longer requires oxygen. He is walking short distances with a 4-pronged cane for stability.   Current Outpatient Prescriptions  Medication Sig Dispense Refill  . acetaminophen (TYLENOL) 500 MG tablet Take 500 mg by mouth every 6 (six) hours as needed.    [name] amLODipine (NORVASC) 10 MG tablet TAKE 1 TABLET BY MOUTH EVERY DAY 30 tablet 6  . atorvastatin (LIPITOR) 20 MG tablet Take 1 tablet (20 mg total) by mouth daily. 90 tablet 3  . carvedilol (COREG) 6.25 MG tablet Take 1 tablet (6.25 mg total) by mouth 2 (two) times daily with a meal. 60 tablet 6  . cholecalciferol (VITAMIN D) 1000 UNITS tablet Take 1 tablet (1,000 Units total) by mouth daily. 30 tablet 1  . CVS VITAMIN B12 1000 MCG tablet TAKE 1 TABLET BY MOUTH DAILY 100 tablet 1  . escitalopram (LEXAPRO) 5 MG tablet Take 1 tablet (5 mg total) by mouth daily. 30 tablet 5  . ferrous sulfate 325 (65 FE) MG tablet Take 1 tablet (325 mg total) by mouth daily. 30 tablet 6  . glipiZIDE (GLUCOTROL) 5 MG tablet TAKE 1/2 TABLET BY MOUTH TWICE A DAY BEFORE A MEAL 30 tablet 11  . hydrALAZINE (APRESOLINE) 100 MG tablet Take 1 tablet (100 mg total) by mouth 3 (three) times daily. 90 tablet 5  . HYDROcodone-acetaminophen (NORCO/VICODIN) 5-325 MG per tablet Take 1 tablet by mouth every 6 (six) hours as needed for moderate pain. 60 tablet 0  . isosorbide mononitrate (IMDUR) 60 MG 24 hr tablet Take 1.5 tablets for 90 mg by mouth twice a day. 45 tablet 11  . OXYGEN Inhale 2.5 L into the lungs See admin instructions. USES OXYGEN EVERY BEDTIME USES DURING DAY ONLY  AS NEEDED    . potassium chloride SA (K-DUR,KLOR-CON) 20 MEQ tablet Take 1.5 tablets (30 mEq total) by mouth 3 (three) times daily.    . SYMBICORT 160-4.5 MCG/ACT inhaler INHALE 2 PUFFS INTO THE LUNGS 2 TIMES DAILY 10.2 Inhaler 5  . SYNTHROID 100 MCG tablet TAKE 2 TABLETS BY MOUTH EVERY MORNING BEFORE BREAKFAST 60 tablet 11  . tamsulosin (FLOMAX) 0.4 MG CAPS capsule Take 1 capsule (0.4 mg total) by mouth daily. 30 capsule 1  . torsemide (DEMADEX) 20 MG tablet Take  (2 tablets) once daily alternating with  (1 tablet) the next day, continuously. 75 tablet 6  . warfarin (COUMADIN) 5 MG tablet TAKE 1 TABLET (5 MG TOTAL) BY MOUTH AS DIRECTED. 15 tablet 0  . pantoprazole (PROTONIX) 40 MG tablet Take 1 tablet (40 mg total) by mouth daily. (Patient not taking: Reported on 08/11/2015) 90 tablet 3  . polyethylene glycol (MIRALAX / GLYCOLAX) packet Take 17 g by mouth daily. (Patient not taking: Reported on 08/11/2015) 14 each 0   No current facility-administered medications for this visit.    Physical Exam: BP 114/71 mmHg  Pulse 60  Resp 16  Ht 6' 2.5" (1.892 m)  Wt 201 lb (91.173 kg)  BMI 25.47 kg/m2  SpO2 98% He looks well. Lung exam is clear. Cardiac exam shows a regular rate and rhythm with normal heart sounds. Chest incision is healing well  The pacemaker incision is healing well  Diagnostic Tests:  CLINICAL DATA: Cardiomyopathy. Pacemaker.  EXAM: CHEST 2 VIEW  COMPARISON: 08/23/2015.  FINDINGS: Right IJ line has been removed. Mediastinum and hilar structures normal. Cardiac pacer with lead tips in right atrium right ventricle. Cardiomegaly. Normal pulmonary vascularity. Interim near complete clearing of pulmonary interstitial edema. Small residual left pleural effusion.  IMPRESSION: 1. Cardiac pacer noted in good anatomic position. Right IJ line is been removed.  2. Improvement of cardiomegaly and near complete clearing of pulmonary interstitial edema. Mild  residual left pleural effusion.   Electronically Signed  By: Maisie Fus Register  On: 09/10/2015 09:50   Impression:  Overall I think he is doing well. I encouraged him to continue walking as much as possible to build up his stamina.  Plan:  He will continue follow-up with cardiology.   Alleen Borne, MD [facility] [phone]

## 2015-09-10 NOTE — Progress Notes (Signed)
Patient ID: Frank Mclean, male   DOB: 1941/04/02, 74 y.o.   MRN: [id number]  Subjective: This patient presents today complaining of painful toenails and requests toenail debridement  Objective: The toenails elongated, brittle, hypertrophic, incurvated, deformed and tender to direct palpation 6-10  Assessment: Diabetic with a history of claudication diabetic foot ulcers Symptomatic onychomycoses 6-10  Plan: Debridement toenails 10 and mechanically and electrically without any bleeding  Reappoint 3 months

## 2015-09-11 DIAGNOSIS — E1151 Type 2 diabetes mellitus with diabetic peripheral angiopathy without gangrene: Secondary | ICD-10-CM | POA: Diagnosis not present

## 2015-09-11 DIAGNOSIS — I129 Hypertensive chronic kidney disease with stage 1 through stage 4 chronic kidney disease, or unspecified chronic kidney disease: Secondary | ICD-10-CM | POA: Diagnosis not present

## 2015-09-11 DIAGNOSIS — I5043 Acute on chronic combined systolic (congestive) and diastolic (congestive) heart failure: Secondary | ICD-10-CM | POA: Diagnosis not present

## 2015-09-11 DIAGNOSIS — J449 Chronic obstructive pulmonary disease, unspecified: Secondary | ICD-10-CM | POA: Diagnosis not present

## 2015-09-11 DIAGNOSIS — I480 Paroxysmal atrial fibrillation: Secondary | ICD-10-CM | POA: Diagnosis not present

## 2015-09-11 DIAGNOSIS — I10 Essential (primary) hypertension: Secondary | ICD-10-CM | POA: Diagnosis not present

## 2015-09-12 ENCOUNTER — Ambulatory Visit (INDEPENDENT_AMBULATORY_CARE_PROVIDER_SITE_OTHER): Payer: Medicare Other | Admitting: *Deleted

## 2015-09-12 DIAGNOSIS — I6350 Cerebral infarction due to unspecified occlusion or stenosis of unspecified cerebral artery: Secondary | ICD-10-CM

## 2015-09-12 DIAGNOSIS — Z5181 Encounter for therapeutic drug level monitoring: Secondary | ICD-10-CM

## 2015-09-12 DIAGNOSIS — I639 Cerebral infarction, unspecified: Secondary | ICD-10-CM

## 2015-09-12 DIAGNOSIS — Z7901 Long term (current) use of anticoagulants: Secondary | ICD-10-CM

## 2015-09-12 LAB — POCT INR: INR: 3.5

## 2015-09-15 DIAGNOSIS — J449 Chronic obstructive pulmonary disease, unspecified: Secondary | ICD-10-CM | POA: Diagnosis not present

## 2015-09-15 DIAGNOSIS — I10 Essential (primary) hypertension: Secondary | ICD-10-CM | POA: Diagnosis not present

## 2015-09-15 DIAGNOSIS — I480 Paroxysmal atrial fibrillation: Secondary | ICD-10-CM | POA: Diagnosis not present

## 2015-09-15 DIAGNOSIS — I129 Hypertensive chronic kidney disease with stage 1 through stage 4 chronic kidney disease, or unspecified chronic kidney disease: Secondary | ICD-10-CM | POA: Diagnosis not present

## 2015-09-15 DIAGNOSIS — E1151 Type 2 diabetes mellitus with diabetic peripheral angiopathy without gangrene: Secondary | ICD-10-CM | POA: Diagnosis not present

## 2015-09-15 DIAGNOSIS — I5043 Acute on chronic combined systolic (congestive) and diastolic (congestive) heart failure: Secondary | ICD-10-CM | POA: Diagnosis not present

## 2015-09-22 ENCOUNTER — Other Ambulatory Visit: Payer: Self-pay | Admitting: Cardiology

## 2015-09-22 DIAGNOSIS — I5043 Acute on chronic combined systolic (congestive) and diastolic (congestive) heart failure: Secondary | ICD-10-CM | POA: Diagnosis not present

## 2015-09-22 DIAGNOSIS — J449 Chronic obstructive pulmonary disease, unspecified: Secondary | ICD-10-CM | POA: Diagnosis not present

## 2015-09-22 DIAGNOSIS — I129 Hypertensive chronic kidney disease with stage 1 through stage 4 chronic kidney disease, or unspecified chronic kidney disease: Secondary | ICD-10-CM | POA: Diagnosis not present

## 2015-09-22 DIAGNOSIS — I10 Essential (primary) hypertension: Secondary | ICD-10-CM | POA: Diagnosis not present

## 2015-09-22 DIAGNOSIS — I480 Paroxysmal atrial fibrillation: Secondary | ICD-10-CM | POA: Diagnosis not present

## 2015-09-22 DIAGNOSIS — E1151 Type 2 diabetes mellitus with diabetic peripheral angiopathy without gangrene: Secondary | ICD-10-CM | POA: Diagnosis not present

## 2015-09-23 ENCOUNTER — Other Ambulatory Visit: Payer: Self-pay | Admitting: Internal Medicine

## 2015-09-25 ENCOUNTER — Ambulatory Visit (INDEPENDENT_AMBULATORY_CARE_PROVIDER_SITE_OTHER): Payer: Medicare Other | Admitting: Pharmacist

## 2015-09-25 DIAGNOSIS — Z7901 Long term (current) use of anticoagulants: Secondary | ICD-10-CM | POA: Diagnosis not present

## 2015-09-25 DIAGNOSIS — Z5181 Encounter for therapeutic drug level monitoring: Secondary | ICD-10-CM | POA: Diagnosis not present

## 2015-09-25 DIAGNOSIS — I6350 Cerebral infarction due to unspecified occlusion or stenosis of unspecified cerebral artery: Secondary | ICD-10-CM

## 2015-09-25 LAB — POCT INR: INR: 1.6

## 2015-09-29 DIAGNOSIS — I10 Essential (primary) hypertension: Secondary | ICD-10-CM | POA: Diagnosis not present

## 2015-09-29 DIAGNOSIS — J449 Chronic obstructive pulmonary disease, unspecified: Secondary | ICD-10-CM | POA: Diagnosis not present

## 2015-09-29 DIAGNOSIS — E1151 Type 2 diabetes mellitus with diabetic peripheral angiopathy without gangrene: Secondary | ICD-10-CM | POA: Diagnosis not present

## 2015-09-29 DIAGNOSIS — I480 Paroxysmal atrial fibrillation: Secondary | ICD-10-CM | POA: Diagnosis not present

## 2015-09-29 DIAGNOSIS — I5043 Acute on chronic combined systolic (congestive) and diastolic (congestive) heart failure: Secondary | ICD-10-CM | POA: Diagnosis not present

## 2015-09-29 DIAGNOSIS — I129 Hypertensive chronic kidney disease with stage 1 through stage 4 chronic kidney disease, or unspecified chronic kidney disease: Secondary | ICD-10-CM | POA: Diagnosis not present

## 2015-10-06 DIAGNOSIS — R338 Other retention of urine: Secondary | ICD-10-CM | POA: Diagnosis not present

## 2015-10-06 DIAGNOSIS — R339 Retention of urine, unspecified: Secondary | ICD-10-CM | POA: Diagnosis not present

## 2015-10-09 ENCOUNTER — Ambulatory Visit (INDEPENDENT_AMBULATORY_CARE_PROVIDER_SITE_OTHER): Payer: Medicare Other

## 2015-10-09 DIAGNOSIS — Z7901 Long term (current) use of anticoagulants: Secondary | ICD-10-CM | POA: Diagnosis not present

## 2015-10-09 DIAGNOSIS — I6350 Cerebral infarction due to unspecified occlusion or stenosis of unspecified cerebral artery: Secondary | ICD-10-CM

## 2015-10-09 DIAGNOSIS — Z5181 Encounter for therapeutic drug level monitoring: Secondary | ICD-10-CM

## 2015-10-09 LAB — POCT INR: INR: 1.5

## 2015-10-22 ENCOUNTER — Other Ambulatory Visit: Payer: Self-pay | Admitting: Internal Medicine

## 2015-10-22 MED ORDER — TRIAMCINOLONE ACETONIDE 0.5 % EX CREA: TOPICAL_CREAM | Freq: Two times a day (BID) | CUTANEOUS | Status: DC

## 2015-10-23 ENCOUNTER — Ambulatory Visit (INDEPENDENT_AMBULATORY_CARE_PROVIDER_SITE_OTHER): Payer: Medicare Other | Admitting: *Deleted

## 2015-10-23 DIAGNOSIS — Z5181 Encounter for therapeutic drug level monitoring: Secondary | ICD-10-CM

## 2015-10-23 DIAGNOSIS — I6350 Cerebral infarction due to unspecified occlusion or stenosis of unspecified cerebral artery: Secondary | ICD-10-CM | POA: Diagnosis not present

## 2015-10-23 DIAGNOSIS — Z7901 Long term (current) use of anticoagulants: Secondary | ICD-10-CM

## 2015-10-23 LAB — POCT INR: INR: 2.3

## 2015-11-02 ENCOUNTER — Other Ambulatory Visit: Payer: Self-pay | Admitting: Physician Assistant

## 2015-11-10 ENCOUNTER — Ambulatory Visit (INDEPENDENT_AMBULATORY_CARE_PROVIDER_SITE_OTHER): Payer: Medicare Other | Admitting: *Deleted

## 2015-11-10 DIAGNOSIS — I6350 Cerebral infarction due to unspecified occlusion or stenosis of unspecified cerebral artery: Secondary | ICD-10-CM | POA: Diagnosis not present

## 2015-11-10 DIAGNOSIS — Z5181 Encounter for therapeutic drug level monitoring: Secondary | ICD-10-CM | POA: Diagnosis not present

## 2015-11-10 DIAGNOSIS — Z7901 Long term (current) use of anticoagulants: Secondary | ICD-10-CM

## 2015-11-10 LAB — POCT INR: INR: 2.2

## 2015-11-26 ENCOUNTER — Encounter: Payer: Self-pay | Admitting: Internal Medicine

## 2015-11-26 ENCOUNTER — Ambulatory Visit (INDEPENDENT_AMBULATORY_CARE_PROVIDER_SITE_OTHER): Payer: Medicare Other | Admitting: Internal Medicine

## 2015-11-26 ENCOUNTER — Other Ambulatory Visit: Payer: Self-pay | Admitting: Internal Medicine

## 2015-11-26 ENCOUNTER — Ambulatory Visit (INDEPENDENT_AMBULATORY_CARE_PROVIDER_SITE_OTHER): Payer: Medicare Other | Admitting: *Deleted

## 2015-11-26 VITALS — BP 144/86 | HR 60 | Ht 74.5 in | Wt 210.8 lb

## 2015-11-26 DIAGNOSIS — I429 Cardiomyopathy, unspecified: Secondary | ICD-10-CM

## 2015-11-26 DIAGNOSIS — I5022 Chronic systolic (congestive) heart failure: Secondary | ICD-10-CM

## 2015-11-26 DIAGNOSIS — Z7901 Long term (current) use of anticoagulants: Secondary | ICD-10-CM

## 2015-11-26 DIAGNOSIS — I6350 Cerebral infarction due to unspecified occlusion or stenosis of unspecified cerebral artery: Secondary | ICD-10-CM

## 2015-11-26 DIAGNOSIS — Z5181 Encounter for therapeutic drug level monitoring: Secondary | ICD-10-CM

## 2015-11-26 DIAGNOSIS — Z95810 Presence of automatic (implantable) cardiac defibrillator: Secondary | ICD-10-CM | POA: Diagnosis not present

## 2015-11-26 DIAGNOSIS — I428 Other cardiomyopathies: Secondary | ICD-10-CM

## 2015-11-26 LAB — CUP PACEART INCLINIC DEVICE CHECK
Brady Statistic AP VS Percent: 0.61 %
Brady Statistic AS VP Percent: 61.86 %
Brady Statistic AS VS Percent: 2.2 %
Brady Statistic RA Percent Paced: 35.94 %
Date Time Interrogation Session: 20161207144807
HIGH POWER IMPEDANCE MEASURED VALUE: 55 Ohm
Implantable Lead Implant Date: 20160323
Implantable Lead Implant Date: 20160323
Implantable Lead Implant Date: 20160323
Implantable Lead Location: 753858
Implantable Lead Location: 753860
Implantable Lead Model: 6935
Lead Channel Impedance Value: 285 Ohm
Lead Channel Impedance Value: 304 Ohm
Lead Channel Impedance Value: 342 Ohm
Lead Channel Impedance Value: 4047 Ohm
Lead Channel Pacing Threshold Amplitude: 0.75 V
Lead Channel Pacing Threshold Pulse Width: 0.4 ms
Lead Channel Pacing Threshold Pulse Width: 0.4 ms
Lead Channel Sensing Intrinsic Amplitude: 3.3 mV
Lead Channel Setting Sensing Sensitivity: 0.3 mV
MDC IDC LEAD LOCATION: 753859
MDC IDC MSMT BATTERY REMAINING LONGEVITY: 106 mo
MDC IDC MSMT BATTERY VOLTAGE: 3.01 V
MDC IDC MSMT LEADCHNL LV IMPEDANCE VALUE: 4047 Ohm
MDC IDC MSMT LEADCHNL LV PACING THRESHOLD AMPLITUDE: 1.75 V
MDC IDC MSMT LEADCHNL RA IMPEDANCE VALUE: 399 Ohm
MDC IDC MSMT LEADCHNL RA PACING THRESHOLD PULSEWIDTH: 0.4 ms
MDC IDC MSMT LEADCHNL RV PACING THRESHOLD AMPLITUDE: 0.75 V
MDC IDC MSMT LEADCHNL RV SENSING INTR AMPL: 12.3 mV
MDC IDC SET LEADCHNL LV PACING AMPLITUDE: 2.75 V
MDC IDC SET LEADCHNL LV PACING PULSEWIDTH: 0.4 ms
MDC IDC SET LEADCHNL RA PACING AMPLITUDE: 1.5 V
MDC IDC STAT BRADY AP VP PERCENT: 35.32 %
MDC IDC STAT BRADY RV PERCENT PACED: 1.29 %

## 2015-11-26 LAB — POCT INR: INR: 2

## 2015-11-26 NOTE — Patient Instructions (Signed)
Medication Instructions:  Your physician recommends that you continue on your current medications as directed. Please refer to the Current Medication list given to you today.  Labwork: None ordered  Testing/Procedures: None ordered  Follow-Up: Remote monitoring is used to monitor your Pacemaker of ICD from home. This monitoring reduces the number of office visits required to check your device to one time per year. It allows us to keep an eye on the functioning of your device to ensure it is working properly. You are scheduled for a device check from home on 02/25/16. You may send your transmission at any time that day. If you have a wireless device, the transmission will be sent automatically. After your physician reviews your transmission, you will receive a postcard with your next transmission date.  Your physician wants you to follow-up in: 1 year with Dr. Taylor.  You will receive a reminder letter in the mail two months in advance. If you don't receive a letter, please call our office to schedule the follow-up appointment.  If you need a refill on your cardiac medications before your next appointment, please call your pharmacy.  Thank you for choosing [facility]!!         

## 2015-11-26 NOTE — Progress Notes (Signed)
HPI Mr. Lipsett returns today for followup. He is a pleasant 74 yo man with chronic systolic heart failure, a non-ischemic cm, LBBB, EF 20% who underwent ICD implant several months ago. At the time of his surgery, CS venography demonstrated a very poor anatomy for CS pacing. A BiV device which would accomodate a Bipolar LV lead was placed. In the interim he has been stable. He still has chronic class 2 heart failure. No ICD shocks. His appetite is improved.  Allergies  Allergen Reactions  . Levothyroxine Sodium Other (See Comments)    MUST TAKE BRAND NAME GENERIC DOESN'T WORK FOR PATIENT  . Chicken Protein Nausea Only and Other (See Comments)    Does not like chicken or [place]  . Eggs Or Egg-Derived Products Nausea Only    Does not like eggs     Current Outpatient Prescriptions  Medication Sig Dispense Refill  . acetaminophen (TYLENOL) 500 MG tablet Take 500 mg by mouth every 6 (six) hours as needed.    [name] amLODipine (NORVASC) 10 MG tablet TAKE 1 TABLET BY MOUTH EVERY DAY 30 tablet 6  . atorvastatin (LIPITOR) 20 MG tablet Take 1 tablet (20 mg total) by mouth daily. 90 tablet 3  . carvedilol (COREG) 6.25 MG tablet Take 1 tablet (6.25 mg total) by mouth 2 (two) times daily with a meal. 60 tablet 6  . cholecalciferol (VITAMIN D) 1000 UNITS tablet Take 1 tablet (1,000 Units total) by mouth daily. 30 tablet 1  . CVS VITAMIN B12 1000 MCG tablet TAKE 1 TABLET BY MOUTH DAILY 100 tablet 1  . escitalopram (LEXAPRO) 5 MG tablet Take 1 tablet (5 mg total) by mouth daily. 30 tablet 5  . ferrous sulfate 325 (65 FE) MG tablet Take 1 tablet (325 mg total) by mouth daily. 30 tablet 6  . glipiZIDE (GLUCOTROL) 5 MG tablet TAKE 1/2 TABLET BY MOUTH TWICE A DAY BEFORE A MEAL 30 tablet 11  . hydrALAZINE (APRESOLINE) 100 MG tablet Take 1 tablet (100 mg total) by mouth 3 (three) times daily. 90 tablet 5  . HYDROcodone-acetaminophen (NORCO/VICODIN) 5-325 MG per tablet Take 1 tablet by mouth every 6 (six)  hours as needed for moderate pain. 60 tablet 0  . isosorbide mononitrate (IMDUR) 60 MG 24 hr tablet Take 1.5 tablets for 90 mg by mouth twice a day. 45 tablet 11  . OXYGEN Inhale 2.5 L into the lungs See admin instructions. USES OXYGEN EVERY BEDTIME USES DURING DAY ONLY AS NEEDED    . potassium chloride SA (K-DUR,KLOR-CON) 20 MEQ tablet Take 1.5 tablets (30 mEq total) by mouth 3 (three) times daily.    . SYMBICORT 160-4.5 MCG/ACT inhaler INHALE 2 PUFFS INTO THE LUNGS 2 TIMES DAILY 10.2 Inhaler 5  . SYNTHROID 100 MCG tablet TAKE 2 TABLETS BY MOUTH EVERY MORNING BEFORE BREAKFAST 60 tablet 11  . tamsulosin (FLOMAX) 0.4 MG CAPS capsule Take 1 capsule (0.4 mg total) by mouth daily. 30 capsule 1  . triamcinolone cream (KENALOG) 0.5 % Apply topically 2 (two) times daily. 60 g 2  . warfarin (COUMADIN) 5 MG tablet TAKE 1 TABLET (5 MG TOTAL) BY MOUTH AS DIRECTED. 30 tablet 1   No current facility-administered medications for this visit.     Past Medical History  Diagnosis Date  . Gout   . HTN (hypertension)   . Hyperlipidemia   . Osteoarthritis   . History of CVA (cerebrovascular accident)     Left pontine infarct July 2004; changed from  Plavix to Coumadin in 2004 per MD at [facility]  . GERD (gastroesophageal reflux disease)   . DJD (degenerative joint disease)   . BPH (benign prostatic hypertrophy)   . Peripheral vascular disease (HCC)   . Bilateral leg ulcer (HCC)     ACHILLES AREA--  NONHEALING  . CKD (chronic kidney disease) stage 3, GFR 30-59 ml/min   . CAD (coronary artery disease)     a. LHC 4/12: Mid LAD 25%, mid diagonal 30%, AV circumflex 40%, proximal OM 25%, distal RCA 40%  . Chronic combined systolic and diastolic heart failure (HCC)     a. Echo 05/2013: EF 25-30%, diffuse HK, restrictive physiology, trivial AI, mild MR, moderate LAE, reduced RV systolic function, PASP 42  . LBBB (left bundle branch block)   . PAF (paroxysmal atrial fibrillation) (HCC)     a. amiodarone Rx started  05/2013;  b. chronic coumadin  . NICM (nonischemic cardiomyopathy) (HCC)     a. EF 25-30%.  [name] Hx of cardiovascular stress test     Nuclear Stress Test (1/16): High risk stress nuclear study with a large, severe, partially reversible inferior and apical defect consistent with prior inferior and apical infarct; mild apical ischemia; severe LVE; study high risk due to reduced LV function.  EF 25% >> reviewed with Dr. Antoine Poche >> medical mgmt  . Hypothyroidism   . DM (diabetes mellitus), type 2 (HCC)   . Presence of permanent cardiac pacemaker   . AICD (automatic cardioverter/defibrillator) present   . Stroke (HCC)   . CHF (congestive heart failure) (HCC)   . Shortness of breath dyspnea   . Pneumonia   . Depression     ROS:   All systems reviewed and negative except as noted in the HPI.   Past Surgical History  Procedure Laterality Date  . Cardiac catheterization  04-16-2011   [facility]    NON-OBSTRUCTIVE CAD. MILDLY ELEVATED PULMONARY PRESSURES/ ELEVATED  END-DIASTOLIC PRESSURE  . Transthoracic echocardiogram  12-31-2010    MODERATE CONCENTRIC LVH/ SYSTOLIC FUNCTION SEVERELY REDUCED/ EF 25-30%/  SEVERE HYPOKINESIS OF ANTEROSEPTAL MYOCARDIUM  AND ENTIREAPICAL MYOCARDIUM /  MODERATE HYPOKINESIS OF LATERAL, INFEROLATERAL, INFERIOR,AND INFEROSEPTAL MYOCARIUM/  GRADE 3 DIASTOLIC DYSFUNCTION/ MILD MR  . Aortogram w/ bilateral lower extremitiy runoff  05-18-2013  DR FIELDS    LEFT LEG OCCLUDED PERONEAL AND ANTERIOR TIBIAL ARTERIES/ HIGH GRADE STENOIS 80% MIDDLE AND DISTAL THIRD OF POSTERIOR TIBIAL ARTERY/ RIGHT PERONEAL AND ANTERIOR TIBIAL ARTERY OCCLUDED/ 40% STENOSIS DISTALLY   . Abdominal aortagram N/A 05/18/2013    Procedure: ABDOMINAL Ronny Flurry;  Surgeon: Sherren Kerns, MD;  Location: [facility] C[facility] LAB;  Service: Cardiovascular;  Laterality: N/A;  . Lower extremity angiogram Bilateral 05/18/2013    Procedure: LOWER EXTREMITY ANGIOGRAM;  Surgeon: Sherren Kerns, MD;  Location: [facility] CATH LAB;   Service: Cardiovascular;  Laterality: Bilateral;  . Right heart catheterization N/A 02/13/2015    Procedure: RIGHT HEART CATH;  Surgeon: Laurey Morale, MD;  Location: [facility] CATH LAB;  Service: Cardiovascular;  Laterality: N/A;  . Bi-ventricular implantable cardioverter defibrillator N/A 03/12/2015    Procedure: BI-VENTRICULAR IMPLANTABLE CARDIOVERTER DEFIBRILLATOR  (CRT-D);  Surgeon: Marinus Maw, MD;  Location: [facility] CATH LAB;  Service: Cardiovascular;  Laterality: N/A;  . Epicardial pacing lead placement N/A 08/21/2015    Procedure: EPICARDIAL PACING LEAD PLACEMENT;  Surgeon: Alleen Borne, MD;  Location: [facility] OR;  Service: Thoracic;  Laterality: N/A;  . Thoracotomy Left 08/21/2015    Procedure: MINI/LIMITED THORACOTOMY;  Surgeon: Payton Doughty  Laneta Simmers, MD;  Location: [facility] OR;  Service: Thoracic;  Laterality: Left;     Family History  Problem Relation Age of Onset  . Hypertension Mother   . Heart disease Mother   . Diabetes Mother   . Gout Other   . Stroke Other   . Hypertension Father   . Heart disease Father   . Heart attack Maternal Grandmother   . Thyroid disease Neg Hx      Social History   Social History  . Marital Status: Married    Spouse Name: N/A  . Number of Children: N/A  . Years of Education: N/A   Occupational History  . Retired - Working in Product manager    Social History Main Topics  . Smoking status: Former Smoker    Quit date: 04/12/1974  . Smokeless tobacco: Never Used  . Alcohol Use: No     Comment: previously drank - "love cognac" quit 15 yrs ago.  . Drug Use: No  . Sexual Activity: No   Other Topics Concern  . Not on file   Social History Narrative   Worked at [employer] year.  He is married and lives with his wife [name] in [place].  Has one daughter Lowella Bandy     BP 144/86 mmHg  Pulse 60  Ht 6' 2.5" (1.892 m)  Wt 210 lb 12.8 oz (95.618 kg)  BMI 26.71 kg/m2  Physical Exam:  Stable appearing 74 yo man, NAD HEENT: Unremarkable Neck:  No JVD, no  thyromegally Back:  No CVA tenderness Lungs:  Clear with no wheezes HEART:  Regular rate rhythm, no murmurs, no rubs, no clicks Abd:  soft, positive bowel sounds, no organomegally, no rebound, no guarding Ext:  2 plus pulses, trace peripheral edema, no cyanosis, no clubbing Skin:  No rashes no nodules Neuro:  CN II through XII intact, motor grossly intact   DEVICE  Normal device function.  See PaceArt for details.   Assess/Plan:

## 2015-11-26 NOTE — Assessment & Plan Note (Signed)
His symptoms appear a bit improved. His fluid index is up but he has no symptoms of fluid overload. I strongly encouraged him to reduce his salt intake.

## 2015-11-26 NOTE — Assessment & Plan Note (Signed)
His BiV ICD is working normally. Will recheck in several months.  

## 2015-12-17 ENCOUNTER — Ambulatory Visit (INDEPENDENT_AMBULATORY_CARE_PROVIDER_SITE_OTHER): Payer: Medicare Other | Admitting: Podiatry

## 2015-12-17 ENCOUNTER — Encounter: Payer: Self-pay | Admitting: Podiatry

## 2015-12-17 DIAGNOSIS — B351 Tinea unguium: Secondary | ICD-10-CM

## 2015-12-17 DIAGNOSIS — M79676 Pain in unspecified toe(s): Secondary | ICD-10-CM

## 2015-12-17 NOTE — Patient Instructions (Signed)
Diabetes and Foot Care Diabetes may cause you to have problems because of poor blood supply (circulation) to your feet and legs. This may cause the skin on your feet to become thinner, break easier, and heal more slowly. Your skin may become dry, and the skin may peel and crack. You may also have nerve damage in your legs and feet causing decreased feeling in them. You may not notice minor injuries to your feet that could lead to infections or more serious problems. Taking care of your feet is one of the most important things you can do for yourself.  HOME CARE INSTRUCTIONS  Wear shoes at all times, even in the house. Do not go barefoot. Bare feet are easily injured.  Check your feet daily for blisters, cuts, and redness. If you cannot see the bottom of your feet, use a mirror or ask someone for help.  Wash your feet with warm water (do not use hot water) and mild soap. Then pat your feet and the areas between your toes until they are completely dry. Do not soak your feet as this can dry your skin.  Apply a moisturizing lotion or petroleum jelly (that does not contain alcohol and is unscented) to the skin on your feet and to dry, brittle toenails. Do not apply lotion between your toes.  Trim your toenails straight across. Do not dig under them or around the cuticle. File the edges of your nails with an emery board or nail file.  Do not cut corns or calluses or try to remove them with medicine.  Wear clean socks or stockings every day. Make sure they are not too tight. Do not wear knee-high stockings since they may decrease blood flow to your legs.  Wear shoes that fit properly and have enough cushioning. To break in new shoes, wear them for just a few hours a day. This prevents you from injuring your feet. Always look in your shoes before you put them on to be sure there are no objects inside.  Do not cross your legs. This may decrease the blood flow to your feet.  If you find a minor scrape,  cut, or break in the skin on your feet, keep it and the skin around it clean and dry. These areas may be cleansed with mild soap and water. Do not cleanse the area with peroxide, alcohol, or iodine.  When you remove an adhesive bandage, be sure not to damage the skin around it.  If you have a wound, look at it several times a day to make sure it is healing.  Do not use heating pads or hot water bottles. They may burn your skin. If you have lost feeling in your feet or legs, you may not know it is happening until it is too late.  Make sure your health care provider performs a complete foot exam at least annually or more often if you have foot problems. Report any cuts, sores, or bruises to your health care provider immediately. SEEK MEDICAL CARE IF:   You have an injury that is not healing.  You have cuts or breaks in the skin.  You have an ingrown nail.  You notice redness on your legs or feet.  You feel burning or tingling in your legs or feet.  You have pain or cramps in your legs and feet.  Your legs or feet are numb.  Your feet always feel cold. SEEK IMMEDIATE MEDICAL CARE IF:   There is increasing redness,   swelling, or pain in or around a wound.  There is a red line that goes up your leg.  Pus is coming from a wound.  You develop a fever or as directed by your health care provider.  You notice a bad smell coming from an ulcer or wound.   This information is not intended to replace advice given to you by your health care provider. Make sure you discuss any questions you have with your health care provider.   Document Released: 12/03/2000 Document Revised: 08/08/2013 Document Reviewed: 05/15/2013 Elsevier Interactive Patient Education 2016 [employer]  

## 2015-12-18 NOTE — Progress Notes (Signed)
Patient ID: Frank Mclean, male   DOB: 14-Jun-1941, 74 y.o.   MRN: [id number]  Subjective: This patient presents today again for schedule visit complaining of thickened and elongated toenails which are cough walking wearing shoes and her request toenail debridement  Objective: Orientated 3 No open skin lesions bilaterally The toenails are hypertrophic, deformed, brittle, elongated and tender direct palpation 6-10  Assessment: Symptomatic onychomycoses 6-10 Diabetic with a history of claudication and diabetic foot ulcerations  Plan: Debridement toenails 6-10 mechanically atelectatic without any bleeding  Reappoint 3 months

## 2015-12-26 ENCOUNTER — Other Ambulatory Visit: Payer: Self-pay | Admitting: Cardiology

## 2016-01-21 ENCOUNTER — Other Ambulatory Visit: Payer: Self-pay | Admitting: Internal Medicine

## 2016-01-22 ENCOUNTER — Other Ambulatory Visit: Payer: Self-pay | Admitting: *Deleted

## 2016-01-22 MED ORDER — FERROUS SULFATE 325 (65 FE) MG PO TABS: 325.0000 mg | ORAL_TABLET | Freq: Every day | ORAL | Status: DC

## 2016-01-22 MED ORDER — VITAMIN D 1000 UNITS PO TABS: 1000.0000 [IU] | ORAL_TABLET | Freq: Every day | ORAL | Status: DC

## 2016-02-24 ENCOUNTER — Other Ambulatory Visit: Payer: Self-pay | Admitting: Internal Medicine

## 2016-02-25 ENCOUNTER — Ambulatory Visit (INDEPENDENT_AMBULATORY_CARE_PROVIDER_SITE_OTHER): Payer: Medicare Other | Admitting: *Deleted

## 2016-02-25 DIAGNOSIS — Z95810 Presence of automatic (implantable) cardiac defibrillator: Secondary | ICD-10-CM | POA: Diagnosis not present

## 2016-02-25 DIAGNOSIS — I429 Cardiomyopathy, unspecified: Secondary | ICD-10-CM | POA: Diagnosis not present

## 2016-02-26 ENCOUNTER — Telehealth: Payer: Self-pay | Admitting: Pharmacist

## 2016-02-26 NOTE — Telephone Encounter (Signed)
LMOM - pt not seen in Coumadin clinic in 3 months. Advised him to call back to schedule INR check.

## 2016-02-27 NOTE — Progress Notes (Signed)
Remote ICD transmission.   

## 2016-02-28 LAB — CUP PACEART REMOTE DEVICE CHECK
Battery Remaining Longevity: 106 mo
Brady Statistic AP VS Percent: 0.27 %
Brady Statistic AS VP Percent: 54.91 %
Date Time Interrogation Session: 20170308072823
HIGH POWER IMPEDANCE MEASURED VALUE: 52 Ohm
Implantable Lead Implant Date: 20160323
Implantable Lead Location: 753859
Lead Channel Impedance Value: 247 Ohm
Lead Channel Impedance Value: 304 Ohm
Lead Channel Impedance Value: 399 Ohm
Lead Channel Impedance Value: 4047 Ohm
Lead Channel Pacing Threshold Amplitude: 0.75 V
Lead Channel Pacing Threshold Pulse Width: 0.4 ms
Lead Channel Pacing Threshold Pulse Width: 0.4 ms
Lead Channel Pacing Threshold Pulse Width: 0.4 ms
Lead Channel Sensing Intrinsic Amplitude: 11.5 mV
Lead Channel Sensing Intrinsic Amplitude: 11.5 mV
Lead Channel Setting Pacing Amplitude: 2 V
Lead Channel Setting Pacing Amplitude: 2.5 V
Lead Channel Setting Pacing Pulse Width: 0.4 ms
MDC IDC LEAD IMPLANT DT: 20160323
MDC IDC LEAD IMPLANT DT: 20160323
MDC IDC LEAD LOCATION: 753858
MDC IDC LEAD LOCATION: 753860
MDC IDC LEAD MODEL: 6935
MDC IDC MSMT BATTERY VOLTAGE: 3 V
MDC IDC MSMT LEADCHNL LV IMPEDANCE VALUE: 4047 Ohm
MDC IDC MSMT LEADCHNL LV PACING THRESHOLD AMPLITUDE: 1.5 V
MDC IDC MSMT LEADCHNL RA SENSING INTR AMPL: 0.75 mV
MDC IDC MSMT LEADCHNL RA SENSING INTR AMPL: 0.75 mV
MDC IDC MSMT LEADCHNL RV IMPEDANCE VALUE: 342 Ohm
MDC IDC MSMT LEADCHNL RV PACING THRESHOLD AMPLITUDE: 0.625 V
MDC IDC SET LEADCHNL RA PACING AMPLITUDE: 1.5 V
MDC IDC SET LEADCHNL RV PACING PULSEWIDTH: 0.4 ms
MDC IDC SET LEADCHNL RV SENSING SENSITIVITY: 0.3 mV
MDC IDC STAT BRADY AP VP PERCENT: 14.05 %
MDC IDC STAT BRADY AS VS PERCENT: 30.77 %
MDC IDC STAT BRADY RA PERCENT PACED: 14.32 %
MDC IDC STAT BRADY RV PERCENT PACED: 43.57 %

## 2016-02-28 NOTE — Progress Notes (Signed)
Abnormal remote check: 100% AF since mid-December. +Warfarin  Plan to ask Melissa to add patient to APP or MD schedule for evaluation

## 2016-03-02 ENCOUNTER — Encounter (HOSPITAL_COMMUNITY): Payer: Self-pay | Admitting: Emergency Medicine

## 2016-03-02 ENCOUNTER — Inpatient Hospital Stay (HOSPITAL_COMMUNITY)
Admission: EM | Admit: 2016-03-02 | Discharge: 2016-03-07 | DRG: 640 | Disposition: A | Discharge status: Skilled Nursing Facility | Payer: Medicare Other | Attending: Internal Medicine | Admitting: Internal Medicine

## 2016-03-02 ENCOUNTER — Emergency Department (HOSPITAL_COMMUNITY): Payer: Medicare Other

## 2016-03-02 DIAGNOSIS — N390 Urinary tract infection, site not specified: Secondary | ICD-10-CM | POA: Diagnosis present

## 2016-03-02 DIAGNOSIS — I4891 Unspecified atrial fibrillation: Secondary | ICD-10-CM | POA: Diagnosis not present

## 2016-03-02 DIAGNOSIS — M109 Gout, unspecified: Secondary | ICD-10-CM | POA: Diagnosis present

## 2016-03-02 DIAGNOSIS — E785 Hyperlipidemia, unspecified: Secondary | ICD-10-CM | POA: Diagnosis present

## 2016-03-02 DIAGNOSIS — F329 Major depressive disorder, single episode, unspecified: Secondary | ICD-10-CM | POA: Diagnosis present

## 2016-03-02 DIAGNOSIS — R197 Diarrhea, unspecified: Secondary | ICD-10-CM | POA: Diagnosis present

## 2016-03-02 DIAGNOSIS — Z7984 Long term (current) use of oral hypoglycemic drugs: Secondary | ICD-10-CM

## 2016-03-02 DIAGNOSIS — B952 Enterococcus as the cause of diseases classified elsewhere: Secondary | ICD-10-CM | POA: Diagnosis present

## 2016-03-02 DIAGNOSIS — T148 Other injury of unspecified body region: Secondary | ICD-10-CM | POA: Diagnosis not present

## 2016-03-02 DIAGNOSIS — Z5181 Encounter for therapeutic drug level monitoring: Secondary | ICD-10-CM | POA: Diagnosis not present

## 2016-03-02 DIAGNOSIS — D649 Anemia, unspecified: Secondary | ICD-10-CM | POA: Diagnosis present

## 2016-03-02 DIAGNOSIS — E1122 Type 2 diabetes mellitus with diabetic chronic kidney disease: Secondary | ICD-10-CM | POA: Diagnosis present

## 2016-03-02 DIAGNOSIS — E1129 Type 2 diabetes mellitus with other diabetic kidney complication: Secondary | ICD-10-CM | POA: Diagnosis not present

## 2016-03-02 DIAGNOSIS — Z7901 Long term (current) use of anticoagulants: Secondary | ICD-10-CM

## 2016-03-02 DIAGNOSIS — Z66 Do not resuscitate: Secondary | ICD-10-CM | POA: Diagnosis present

## 2016-03-02 DIAGNOSIS — K219 Gastro-esophageal reflux disease without esophagitis: Secondary | ICD-10-CM | POA: Diagnosis present

## 2016-03-02 DIAGNOSIS — Z9981 Dependence on supplemental oxygen: Secondary | ICD-10-CM

## 2016-03-02 DIAGNOSIS — I428 Other cardiomyopathies: Secondary | ICD-10-CM

## 2016-03-02 DIAGNOSIS — I447 Left bundle-branch block, unspecified: Secondary | ICD-10-CM | POA: Diagnosis present

## 2016-03-02 DIAGNOSIS — I480 Paroxysmal atrial fibrillation: Secondary | ICD-10-CM | POA: Diagnosis present

## 2016-03-02 DIAGNOSIS — Z8673 Personal history of transient ischemic attack (TIA), and cerebral infarction without residual deficits: Secondary | ICD-10-CM | POA: Diagnosis not present

## 2016-03-02 DIAGNOSIS — E870 Hyperosmolality and hypernatremia: Principal | ICD-10-CM | POA: Diagnosis present

## 2016-03-02 DIAGNOSIS — Z91018 Allergy to other foods: Secondary | ICD-10-CM | POA: Diagnosis not present

## 2016-03-02 DIAGNOSIS — Z823 Family history of stroke: Secondary | ICD-10-CM | POA: Diagnosis not present

## 2016-03-02 DIAGNOSIS — R7989 Other specified abnormal findings of blood chemistry: Secondary | ICD-10-CM | POA: Diagnosis not present

## 2016-03-02 DIAGNOSIS — R413 Other amnesia: Secondary | ICD-10-CM | POA: Diagnosis present

## 2016-03-02 DIAGNOSIS — N183 Chronic kidney disease, stage 3 unspecified: Secondary | ICD-10-CM | POA: Diagnosis present

## 2016-03-02 DIAGNOSIS — R17 Unspecified jaundice: Secondary | ICD-10-CM | POA: Diagnosis present

## 2016-03-02 DIAGNOSIS — S299XXA Unspecified injury of thorax, initial encounter: Secondary | ICD-10-CM | POA: Diagnosis not present

## 2016-03-02 DIAGNOSIS — F32A Depression, unspecified: Secondary | ICD-10-CM | POA: Diagnosis present

## 2016-03-02 DIAGNOSIS — Z7951 Long term (current) use of inhaled steroids: Secondary | ICD-10-CM

## 2016-03-02 DIAGNOSIS — E039 Hypothyroidism, unspecified: Secondary | ICD-10-CM | POA: Diagnosis present

## 2016-03-02 DIAGNOSIS — F0390 Unspecified dementia without behavioral disturbance: Secondary | ICD-10-CM | POA: Diagnosis present

## 2016-03-02 DIAGNOSIS — I2510 Atherosclerotic heart disease of native coronary artery without angina pectoris: Secondary | ICD-10-CM | POA: Diagnosis present

## 2016-03-02 DIAGNOSIS — Z888 Allergy status to other drugs, medicaments and biological substances status: Secondary | ICD-10-CM

## 2016-03-02 DIAGNOSIS — B961 Klebsiella pneumoniae [K. pneumoniae] as the cause of diseases classified elsewhere: Secondary | ICD-10-CM | POA: Diagnosis present

## 2016-03-02 DIAGNOSIS — Z833 Family history of diabetes mellitus: Secondary | ICD-10-CM

## 2016-03-02 DIAGNOSIS — W19XXXA Unspecified fall, initial encounter: Secondary | ICD-10-CM | POA: Diagnosis present

## 2016-03-02 DIAGNOSIS — R748 Abnormal levels of other serum enzymes: Secondary | ICD-10-CM | POA: Diagnosis present

## 2016-03-02 DIAGNOSIS — G9340 Encephalopathy, unspecified: Secondary | ICD-10-CM | POA: Diagnosis present

## 2016-03-02 DIAGNOSIS — I739 Peripheral vascular disease, unspecified: Secondary | ICD-10-CM | POA: Diagnosis present

## 2016-03-02 DIAGNOSIS — I429 Cardiomyopathy, unspecified: Secondary | ICD-10-CM | POA: Diagnosis present

## 2016-03-02 DIAGNOSIS — I5042 Chronic combined systolic (congestive) and diastolic (congestive) heart failure: Secondary | ICD-10-CM | POA: Diagnosis not present

## 2016-03-02 DIAGNOSIS — Z95810 Presence of automatic (implantable) cardiac defibrillator: Secondary | ICD-10-CM

## 2016-03-02 DIAGNOSIS — R778 Other specified abnormalities of plasma proteins: Secondary | ICD-10-CM

## 2016-03-02 DIAGNOSIS — D631 Anemia in chronic kidney disease: Secondary | ICD-10-CM | POA: Diagnosis present

## 2016-03-02 DIAGNOSIS — N289 Disorder of kidney and ureter, unspecified: Secondary | ICD-10-CM | POA: Diagnosis not present

## 2016-03-02 DIAGNOSIS — Z5189 Encounter for other specified aftercare: Secondary | ICD-10-CM | POA: Diagnosis not present

## 2016-03-02 DIAGNOSIS — M1990 Unspecified osteoarthritis, unspecified site: Secondary | ICD-10-CM | POA: Diagnosis present

## 2016-03-02 DIAGNOSIS — R2681 Unsteadiness on feet: Secondary | ICD-10-CM | POA: Diagnosis not present

## 2016-03-02 DIAGNOSIS — I472 Ventricular tachycardia: Secondary | ICD-10-CM | POA: Diagnosis present

## 2016-03-02 DIAGNOSIS — N184 Chronic kidney disease, stage 4 (severe): Secondary | ICD-10-CM | POA: Diagnosis present

## 2016-03-02 DIAGNOSIS — I5043 Acute on chronic combined systolic (congestive) and diastolic (congestive) heart failure: Secondary | ICD-10-CM | POA: Diagnosis present

## 2016-03-02 DIAGNOSIS — E876 Hypokalemia: Secondary | ICD-10-CM | POA: Diagnosis present

## 2016-03-02 DIAGNOSIS — N400 Enlarged prostate without lower urinary tract symptoms: Secondary | ICD-10-CM | POA: Diagnosis present

## 2016-03-02 DIAGNOSIS — I129 Hypertensive chronic kidney disease with stage 1 through stage 4 chronic kidney disease, or unspecified chronic kidney disease: Secondary | ICD-10-CM | POA: Diagnosis present

## 2016-03-02 DIAGNOSIS — Z8249 Family history of ischemic heart disease and other diseases of the circulatory system: Secondary | ICD-10-CM | POA: Diagnosis not present

## 2016-03-02 DIAGNOSIS — N189 Chronic kidney disease, unspecified: Secondary | ICD-10-CM

## 2016-03-02 DIAGNOSIS — I441 Atrioventricular block, second degree: Secondary | ICD-10-CM | POA: Diagnosis present

## 2016-03-02 DIAGNOSIS — I420 Dilated cardiomyopathy: Secondary | ICD-10-CM | POA: Diagnosis not present

## 2016-03-02 DIAGNOSIS — E860 Dehydration: Secondary | ICD-10-CM | POA: Diagnosis present

## 2016-03-02 DIAGNOSIS — E11649 Type 2 diabetes mellitus with hypoglycemia without coma: Secondary | ICD-10-CM | POA: Diagnosis present

## 2016-03-02 DIAGNOSIS — Z87891 Personal history of nicotine dependence: Secondary | ICD-10-CM | POA: Diagnosis not present

## 2016-03-02 DIAGNOSIS — R4182 Altered mental status, unspecified: Secondary | ICD-10-CM | POA: Diagnosis not present

## 2016-03-02 DIAGNOSIS — M6281 Muscle weakness (generalized): Secondary | ICD-10-CM | POA: Diagnosis not present

## 2016-03-02 DIAGNOSIS — Z9181 History of falling: Secondary | ICD-10-CM | POA: Diagnosis not present

## 2016-03-02 DIAGNOSIS — R52 Pain, unspecified: Secondary | ICD-10-CM | POA: Diagnosis not present

## 2016-03-02 DIAGNOSIS — Z79899 Other long term (current) drug therapy: Secondary | ICD-10-CM

## 2016-03-02 DIAGNOSIS — R55 Syncope and collapse: Secondary | ICD-10-CM | POA: Diagnosis not present

## 2016-03-02 LAB — CBC WITH DIFFERENTIAL/PLATELET
BASOS ABS: 0 10*3/uL (ref 0.0–0.1)
Basophils Relative: 0 %
EOS ABS: 0 10*3/uL (ref 0.0–0.7)
Eosinophils Relative: 0 %
HEMATOCRIT: 36 % — AB (ref 39.0–52.0)
Hemoglobin: 11 g/dL — ABNORMAL LOW (ref 13.0–17.0)
LYMPHS ABS: 0.2 10*3/uL — AB (ref 0.7–4.0)
LYMPHS PCT: 2 %
MCH: 24.9 pg — ABNORMAL LOW (ref 26.0–34.0)
MCHC: 30.6 g/dL (ref 30.0–36.0)
MCV: 81.6 fL (ref 78.0–100.0)
MONOS PCT: 4 %
Monocytes Absolute: 0.3 10*3/uL (ref 0.1–1.0)
NEUTROS ABS: 7.6 10*3/uL (ref 1.7–7.7)
NEUTROS PCT: 94 %
Platelets: 158 10*3/uL (ref 150–400)
RBC: 4.41 MIL/uL (ref 4.22–5.81)
RDW: 18.4 % — AB (ref 11.5–15.5)
WBC: 8.1 10*3/uL (ref 4.0–10.5)

## 2016-03-02 LAB — COMPREHENSIVE METABOLIC PANEL
ALT: 28 U/L (ref 17–63)
ANION GAP: 12 (ref 5–15)
AST: 24 U/L (ref 15–41)
Albumin: 3.8 g/dL (ref 3.5–5.0)
Alkaline Phosphatase: 124 U/L (ref 38–126)
BILIRUBIN TOTAL: 1.7 mg/dL — AB (ref 0.3–1.2)
BUN: 28 mg/dL — ABNORMAL HIGH (ref 6–20)
CHLORIDE: 119 mmol/L — AB (ref 101–111)
CO2: 24 mmol/L (ref 22–32)
Calcium: 9.4 mg/dL (ref 8.9–10.3)
Creatinine, Ser: 2.1 mg/dL — ABNORMAL HIGH (ref 0.61–1.24)
GFR, EST AFRICAN AMERICAN: 34 mL/min — AB (ref >60)
GFR, EST NON AFRICAN AMERICAN: 29 mL/min — AB (ref >60)
Glucose, Bld: 127 mg/dL — ABNORMAL HIGH (ref 65–99)
POTASSIUM: 3 mmol/L — AB (ref 3.5–5.1)
Sodium: 155 mmol/L — ABNORMAL HIGH (ref 135–145)
TOTAL PROTEIN: 6.4 g/dL — AB (ref 6.5–8.1)

## 2016-03-02 LAB — PROTIME-INR
INR: 1.47 (ref 0.00–1.49)
PROTHROMBIN TIME: 17.9 s — AB (ref 11.6–15.2)

## 2016-03-02 LAB — TROPONIN I: Troponin I: 0.24 ng/mL — ABNORMAL HIGH (ref <0.031)

## 2016-03-02 MED ORDER — ONDANSETRON HCL 4 MG/2ML IJ SOLN
4.0000 mg | Freq: Once | INTRAMUSCULAR | Status: AC
  Administered 2016-03-02: 4 mg via INTRAVENOUS
  Filled 2016-03-02: qty 2

## 2016-03-02 MED ORDER — SODIUM CHLORIDE 0.9 % IV BOLUS (SEPSIS)
1000.0000 mL | Freq: Once | INTRAVENOUS | Status: AC
  Administered 2016-03-02: 1000 mL via INTRAVENOUS

## 2016-03-02 NOTE — ED Notes (Signed)
Bed: WA09 Expected date:  Expected time:  Means of arrival:  Comments: Fall, no complaints

## 2016-03-02 NOTE — ED Provider Notes (Addendum)
CSN: [id number]     Arrival date & time 03/02/16  2052 History   First MD Initiated Contact with Patient 03/02/16 2141     Chief Complaint  Patient presents with  . Fall     (Consider location/radiation/quality/duration/timing/severity/associated sxs/prior Treatment) HPI ....Marland KitchenMarland KitchenLevel V caveat for mild confusion. Patient is medically complex. He has a host of medical issues well documented in the past medical history. Wife reports a fall today with associated frequent urination and diarrhea. He has not been eating well. He is very frail. He has not been taking his medications. No fever, chills, chest pain, dyspnea. Past Medical History  Diagnosis Date  . Gout   . HTN (hypertension)   . Hyperlipidemia   . Osteoarthritis   . History of CVA (cerebrovascular accident)     Left pontine infarct July 2004; changed from Plavix to Coumadin in 2004 per MD at [facility]  . GERD (gastroesophageal reflux disease)   . DJD (degenerative joint disease)   . BPH (benign prostatic hypertrophy)   . Peripheral vascular disease (HCC)   . Bilateral leg ulcer (HCC)     ACHILLES AREA--  NONHEALING  . CKD (chronic kidney disease) stage 3, GFR 30-59 ml/min   . CAD (coronary artery disease)     a. LHC 4/12: Mid LAD 25%, mid diagonal 30%, AV circumflex 40%, proximal OM 25%, distal RCA 40%  . Chronic combined systolic and diastolic heart failure (HCC)     a. Echo 05/2013: EF 25-30%, diffuse HK, restrictive physiology, trivial AI, mild MR, moderate LAE, reduced RV systolic function, PASP 42  . LBBB (left bundle branch block)   . PAF (paroxysmal atrial fibrillation) (HCC)     a. amiodarone Rx started 05/2013;  b. chronic coumadin  . NICM (nonischemic cardiomyopathy) (HCC)     a. EF 25-30%.  [name] Hx of cardiovascular stress test     Nuclear Stress Test (1/16): High risk stress nuclear study with a large, severe, partially reversible inferior and apical defect consistent with prior inferior and apical infarct; mild apical  ischemia; severe LVE; study high risk due to reduced LV function.  EF 25% >> reviewed with Dr. Antoine Poche >> medical mgmt  . Hypothyroidism   . DM (diabetes mellitus), type 2 (HCC)   . Presence of permanent cardiac pacemaker   . AICD (automatic cardioverter/defibrillator) present   . Stroke (HCC)   . CHF (congestive heart failure) (HCC)   . Shortness of breath dyspnea   . Pneumonia   . Depression    Past Surgical History  Procedure Laterality Date  . Cardiac catheterization  04-16-2011   DR [facility]    NON-OBSTRUCTIVE CAD. MILDLY ELEVATED PULMONARY PRESSURES/ ELEVATED  END-DIASTOLIC PRESSURE  . Transthoracic echocardiogram  12-31-2010    MODERATE CONCENTRIC LVH/ SYSTOLIC FUNCTION SEVERELY REDUCED/ EF 25-30%/  SEVERE HYPOKINESIS OF ANTEROSEPTAL MYOCARDIUM  AND ENTIREAPICAL MYOCARDIUM /  MODERATE HYPOKINESIS OF LATERAL, INFEROLATERAL, INFERIOR,AND INFEROSEPTAL MYOCARIUM/  GRADE 3 DIASTOLIC DYSFUNCTION/ MILD MR  . Aortogram w/ bilateral lower extremitiy runoff  05-18-2013  DR FIELDS    LEFT LEG OCCLUDED PERONEAL AND ANTERIOR TIBIAL ARTERIES/ HIGH GRADE STENOIS 80% MIDDLE AND DISTAL THIRD OF POSTERIOR TIBIAL ARTERY/ RIGHT PERONEAL AND ANTERIOR TIBIAL ARTERY OCCLUDED/ 40% STENOSIS DISTALLY   . Abdominal aortagram N/A 05/18/2013    Procedure: ABDOMINAL Ronny Flurry;  Surgeon: Sherren Kerns, MD;  Location: [facility] CATH LAB;  Service: Cardiovascular;  Laterality: N/A;  . Lower extremity angiogram Bilateral 05/18/2013    Procedure: LOWER EXTREMITY ANGIOGRAM;  Surgeon: Leonette Most  Holland Commons, MD;  Location: [facility] CATH LAB;  Service: Cardiovascular;  Laterality: Bilateral;  . Right heart catheterization N/A 02/13/2015    Procedure: RIGHT HEART CATH;  Surgeon: Laurey Morale, MD;  Location: [facility] CATH LAB;  Service: Cardiovascular;  Laterality: N/A;  . Bi-ventricular implantable cardioverter defibrillator N/A 03/12/2015    Procedure: BI-VENTRICULAR IMPLANTABLE CARDIOVERTER DEFIBRILLATOR  (CRT-D);  Surgeon: Marinus Maw,  MD;  Location: [facility] CATH LAB;  Service: Cardiovascular;  Laterality: N/A;  . Epicardial pacing lead placement N/A 08/21/2015    Procedure: EPICARDIAL PACING LEAD PLACEMENT;  Surgeon: Alleen Borne, MD;  Location: [facility] OR;  Service: Thoracic;  Laterality: N/A;  . Thoracotomy Left 08/21/2015    Procedure: MINI/LIMITED THORACOTOMY;  Surgeon: Alleen Borne, MD;  Location: [facility] OR;  Service: Thoracic;  Laterality: Left;   Family History  Problem Relation Age of Onset  . Hypertension Mother   . Heart disease Mother   . Diabetes Mother   . Gout Other   . Stroke Other   . Hypertension Father   . Heart disease Father   . Heart attack Maternal Grandmother   . Thyroid disease Neg Hx    Social History  Substance Use Topics  . Smoking status: Former Smoker    Quit date: 04/12/1974  . Smokeless tobacco: Never Used  . Alcohol Use: No     Comment: previously drank - "love cognac" quit 15 yrs ago.    Review of Systems  Reason unable to perform ROS: Altered mental status.      Allergies  Levothyroxine sodium; Chicken protein; and Eggs or egg-derived products  Home Medications   Prior to Admission medications   Medication Sig Start Date End Date Taking? Authorizing Provider  amLODipine (NORVASC) 10 MG tablet TAKE 1 TABLET BY MOUTH EVERY DAY Patient taking differently: TAKE 10 MG BY MOUTH EVERY DAY 06/13/15  Yes Rollene Rotunda, MD  atorvastatin (LIPITOR) 20 MG tablet Take 1 tablet (20 mg total) by mouth daily. 04/01/15  Yes Daniel J Angiulli, PA-C  carvedilol (COREG) 6.25 MG tablet Take 1 tablet (6.25 mg total) by mouth 2 (two) times daily with a meal. 07/14/15  Yes Laurey Morale, MD  cholecalciferol (VITAMIN D) 1000 units tablet Take 1 tablet (1,000 Units total) by mouth daily. 01/22/16  Yes Aleksei Plotnikov V, MD  [employer] VITAMIN B12 1000 MCG tablet TAKE 1 TABLET BY MOUTH DAILY Patient taking differently: TAKE 1000 MCG BY MOUTH DAILY 01/27/15  Yes Aleksei Plotnikov V, MD  escitalopram (LEXAPRO) 5 MG  tablet TAKE 1 TABLET BY MOUTH EVERY DAY Patient taking differently: TAKE 5 MG BY MOUTH EVERY DAY 11/27/15  Yes Aleksei Plotnikov V, MD  ferrous sulfate 325 (65 FE) MG tablet Take 1 tablet (325 mg total) by mouth daily. 01/22/16  Yes Aleksei Plotnikov V, MD  glipiZIDE (GLUCOTROL) 5 MG tablet TAKE 1/2 TABLET BY MOUTH TWICE A DAY BEFORE A MEAL 02/24/16  Yes Aleksei Plotnikov V, MD  hydrALAZINE (APRESOLINE) 100 MG tablet Take 1 tablet (100 mg total) by mouth 3 (three) times daily. 08/15/15  Yes Rollene Rotunda, MD  isosorbide mononitrate (IMDUR) 60 MG 24 hr tablet TAKE 1.5 TABLETS (90 MG TOTAL) BY MOUTH TWICE A DAY. 01/21/16  Yes Aleksei Plotnikov V, MD  OXYGEN Inhale 2.5 L into the lungs See admin instructions. USES OXYGEN EVERY BEDTIME USES DURING DAY ONLY AS NEEDED   Yes Historical Provider, MD  potassium chloride SA (K-DUR,KLOR-CON) 20 MEQ tablet Take 1.5 tablets (30 mEq total) by  mouth 3 (three) times daily. 06/16/15  Yes Dayna N Dunn, PA-C  SYMBICORT 160-4.5 MCG/ACT inhaler INHALE 2 PUFFS INTO THE LUNGS 2 TIMES DAILY 05/07/15  Yes Aleksei Plotnikov V, MD  SYNTHROID 100 MCG tablet TAKE 2 TABLETS BY MOUTH EVERY MORNING BEFORE BREAKFAST Patient taking differently: TAKE 200 MCG BY MOUTH EVERY MORNING BEFORE BREAKFAST 01/27/15  Yes Aleksei Plotnikov V, MD  torsemide (DEMADEX) 20 MG tablet Take 20-40 mg by mouth as directed. Take 40 mg once daily alternating with 20 mg the next day continuously 02/24/16  Yes Historical Provider, MD  warfarin (COUMADIN) 5 MG tablet TAKE 1 TABLET (5 MG TOTAL) BY MOUTH AS DIRECTED. Patient taking differently: Take 2.5 mg by mouth monday, wednesday, friday and 5 mg on all other days 12/26/15  Yes Rollene Rotunda, MD  HYDROcodone-acetaminophen (NORCO/VICODIN) 5-325 MG per tablet Take 1 tablet by mouth every 6 (six) hours as needed for moderate pain. Patient not taking: Reported on 03/02/2016 04/01/15   Mcarthur Rossetti Angiulli, PA-C  tamsulosin (FLOMAX) 0.4 MG CAPS capsule Take 1 capsule (0.4 mg  total) by mouth daily. Patient not taking: Reported on 03/02/2016 08/26/15   Erin R Barrett, PA-C  triamcinolone cream (KENALOG) 0.5 % Apply topically 2 (two) times daily. Patient not taking: Reported on 03/02/2016 10/22/15   Aleksei Plotnikov V, MD   BP 143/95 mmHg  Pulse 111  Temp(Src) 98.5 F (36.9 C) (Oral)  Resp 15  SpO2 95% Physical Exam  Constitutional: He is oriented to person, place, and time.  Pleasant, no specific complaints of pain  HENT:  Head: Normocephalic and atraumatic.  Eyes: Conjunctivae and EOM are normal. Pupils are equal, round, and reactive to light.  Neck: Normal range of motion. Neck supple.  Cardiovascular: Normal rate and regular rhythm.   Pulmonary/Chest: Effort normal and breath sounds normal.  Abdominal: Soft. Bowel sounds are normal.  Musculoskeletal: Normal range of motion.  No obvious bony tenderness.  Neurological: He is alert and oriented to person, place, and time.  Skin: Skin is warm and dry.  Psychiatric: He has a normal mood and affect. His behavior is normal.  Nursing note and vitals reviewed.   ED Course  Procedures (including critical care time) Labs Review Labs Reviewed  COMPREHENSIVE METABOLIC PANEL - Abnormal; Notable for the following:    Sodium 155 (*)    Potassium 3.0 (*)    Chloride 119 (*)    Glucose, Bld 127 (*)    BUN 28 (*)    Creatinine, Ser 2.10 (*)    Total Protein 6.4 (*)    Total Bilirubin 1.7 (*)    GFR calc non Af Amer 29 (*)    GFR calc Af Amer 34 (*)    All other components within normal limits  CBC WITH DIFFERENTIAL/PLATELET - Abnormal; Notable for the following:    Hemoglobin 11.0 (*)    HCT 36.0 (*)    MCH 24.9 (*)    RDW 18.4 (*)    All other components within normal limits  TROPONIN I - Abnormal; Notable for the following:    Troponin I 0.24 (*)    All other components within normal limits  PROTIME-INR - Abnormal; Notable for the following:    Prothrombin Time 17.9 (*)    All other components within  normal limits  URINALYSIS, ROUTINE W REFLEX MICROSCOPIC (NOT AT [facility])    Imaging Review Dg Chest Port 1 View  03/02/2016  CLINICAL DATA:  Fall this morning, now with weakness. EXAM: PORTABLE CHEST  1 VIEW COMPARISON:  None. FINDINGS: There is marked cardiomegaly. Left chest wall pacemaker/AICD in place without evidence of wire discontinuity. There is mild central pulmonary vascular congestion without frank alveolar pulmonary edema. Lungs are otherwise clear. Small portion of the right costophrenic angle is excluded on this exam. Osseous structures about the chest are unremarkable. IMPRESSION: 1. Marked cardiomegaly. Mild central pulmonary vascular congestion, probably chronic, without overt pulmonary edema. 2. Lungs are otherwise clear. No evidence of pneumonia or pleural effusion. This exam was interpreted during a PACS downtime with limited availability of comparison cases. It has been flagged for review following the downtime. If clinically indicated after this review, an addendum will be issued providing details about comparison to prior imaging. Electronically Signed   By: Bary Richard M.D.   On: 03/02/2016 22:23   I have personally reviewed and evaluated these images and lab results as part of my medical decision-making.   EKG Interpretation None     CRITICAL CARE Performed by: Donnetta Hutching Total critical care time: 30 minutes Critical care time was exclusive of separately billable procedures and treating other patients. Critical care was necessary to treat or prevent imminent or life-threatening deterioration. Critical care was time spent personally by me on the following activities: development of treatment plan with patient and/or surrogate as well as nursing, discussions with consultants, evaluation of patient's response to treatment, examination of patient, obtaining history from patient or surrogate, ordering and performing treatments and interventions, ordering and review of laboratory  studies, ordering and review of radiographic studies, pulse oximetry and re-evaluation of patient's condition. MDM   Final diagnoses:  Hypernatremia  Renal insufficiency  Elevated troponin    Patient has several anomalies on his workup. Sodium was 155.  Creatinine 2.1. Troponin 0.24. EKG shows a ventricular paced complex.  Patient is slightly tachycardic but his blood pressure is good. Will admit to gen med    Donnetta Hutching, MD 03/02/16 9528  Donnetta Hutching, MD 03/02/16 [phone]

## 2016-03-02 NOTE — ED Notes (Addendum)
Patient here from home with complaints of witness fall today. No pain. Hx dementia. Alert to self. Pt on coumadin.

## 2016-03-03 ENCOUNTER — Encounter: Payer: Self-pay | Admitting: Cardiology

## 2016-03-03 ENCOUNTER — Encounter (HOSPITAL_COMMUNITY): Payer: Self-pay | Admitting: Family Medicine

## 2016-03-03 DIAGNOSIS — E870 Hyperosmolality and hypernatremia: Principal | ICD-10-CM

## 2016-03-03 DIAGNOSIS — F329 Major depressive disorder, single episode, unspecified: Secondary | ICD-10-CM

## 2016-03-03 LAB — BASIC METABOLIC PANEL
Anion gap: 6 (ref 5–15)
BUN: 27 mg/dL — AB (ref 6–20)
CHLORIDE: 116 mmol/L — AB (ref 101–111)
CO2: 23 mmol/L (ref 22–32)
CREATININE: 2.3 mg/dL — AB (ref 0.61–1.24)
Calcium: 8.8 mg/dL — ABNORMAL LOW (ref 8.9–10.3)
GFR calc Af Amer: 30 mL/min — ABNORMAL LOW (ref >60)
GFR calc non Af Amer: 26 mL/min — ABNORMAL LOW (ref >60)
Glucose, Bld: 134 mg/dL — ABNORMAL HIGH (ref 65–99)
Potassium: 3.4 mmol/L — ABNORMAL LOW (ref 3.5–5.1)
Sodium: 145 mmol/L (ref 135–145)

## 2016-03-03 LAB — URINALYSIS W MICROSCOPIC (NOT AT ARMC)
Bilirubin Urine: NEGATIVE
Glucose, UA: NEGATIVE mg/dL
Hgb urine dipstick: NEGATIVE
KETONES UR: NEGATIVE mg/dL
Nitrite: POSITIVE — AB
PH: 5.5 (ref 5.0–8.0)
Protein, ur: 30 mg/dL — AB
SPECIFIC GRAVITY, URINE: 1.016 (ref 1.005–1.030)

## 2016-03-03 LAB — COMPREHENSIVE METABOLIC PANEL
ALBUMIN: 3.3 g/dL — AB (ref 3.5–5.0)
ALK PHOS: 100 U/L (ref 38–126)
ALT: 23 U/L (ref 17–63)
ANION GAP: 9 (ref 5–15)
AST: 17 U/L (ref 15–41)
BUN: 30 mg/dL — AB (ref 6–20)
CALCIUM: 8.9 mg/dL (ref 8.9–10.3)
CO2: 25 mmol/L (ref 22–32)
Chloride: 118 mmol/L — ABNORMAL HIGH (ref 101–111)
Creatinine, Ser: 2.13 mg/dL — ABNORMAL HIGH (ref 0.61–1.24)
GFR calc Af Amer: 33 mL/min — ABNORMAL LOW (ref >60)
GFR calc non Af Amer: 28 mL/min — ABNORMAL LOW (ref >60)
GLUCOSE: 148 mg/dL — AB (ref 65–99)
POTASSIUM: 3.1 mmol/L — AB (ref 3.5–5.1)
SODIUM: 152 mmol/L — AB (ref 135–145)
Total Bilirubin: 0.9 mg/dL (ref 0.3–1.2)
Total Protein: 5.5 g/dL — ABNORMAL LOW (ref 6.5–8.1)

## 2016-03-03 LAB — TROPONIN I: TROPONIN I: 0.44 ng/mL — AB (ref <0.031)

## 2016-03-03 LAB — GLUCOSE, CAPILLARY
GLUCOSE-CAPILLARY: 106 mg/dL — AB (ref 65–99)
GLUCOSE-CAPILLARY: 144 mg/dL — AB (ref 65–99)
GLUCOSE-CAPILLARY: 113 mg/dL — AB (ref 65–99)
GLUCOSE-CAPILLARY: 113 mg/dL — AB (ref 65–99)
Glucose-Capillary: 116 mg/dL — ABNORMAL HIGH (ref 65–99)

## 2016-03-03 LAB — CBC
HEMATOCRIT: 31.5 % — AB (ref 39.0–52.0)
HEMOGLOBIN: 9.6 g/dL — AB (ref 13.0–17.0)
MCH: 24.3 pg — AB (ref 26.0–34.0)
MCHC: 30.5 g/dL (ref 30.0–36.0)
MCV: 79.7 fL (ref 78.0–100.0)
Platelets: 164 10*3/uL (ref 150–400)
RBC: 3.95 MIL/uL — ABNORMAL LOW (ref 4.22–5.81)
RDW: 18.2 % — ABNORMAL HIGH (ref 11.5–15.5)
WBC: 6.6 10*3/uL (ref 4.0–10.5)

## 2016-03-03 LAB — PROTIME-INR
INR: 1.65 — AB (ref 0.00–1.49)
Prothrombin Time: 19.5 seconds — ABNORMAL HIGH (ref 11.6–15.2)

## 2016-03-03 LAB — TSH: TSH: 0.018 u[IU]/mL — ABNORMAL LOW (ref 0.350–4.500)

## 2016-03-03 LAB — MAGNESIUM: MAGNESIUM: 1.9 mg/dL (ref 1.7–2.4)

## 2016-03-03 LAB — BRAIN NATRIURETIC PEPTIDE: B Natriuretic Peptide: 1253.1 pg/mL — ABNORMAL HIGH (ref 0.0–100.0)

## 2016-03-03 MED ORDER — ESCITALOPRAM OXALATE 10 MG PO TABS
5.0000 mg | ORAL_TABLET | Freq: Every day | ORAL | Status: DC
  Administered 2016-03-03 – 2016-03-07 (×5): 5 mg via ORAL
  Filled 2016-03-03 (×5): qty 1

## 2016-03-03 MED ORDER — WARFARIN SODIUM 5 MG PO TABS
5.0000 mg | ORAL_TABLET | Freq: Once | ORAL | Status: AC
  Administered 2016-03-03: 5 mg via ORAL
  Filled 2016-03-03: qty 1

## 2016-03-03 MED ORDER — WARFARIN - PHARMACIST DOSING INPATIENT: Freq: Every day | Status: DC

## 2016-03-03 MED ORDER — INSULIN ASPART 100 UNIT/ML ~~LOC~~ SOLN
0.0000 [IU] | Freq: Three times a day (TID) | SUBCUTANEOUS | Status: DC
  Administered 2016-03-03 – 2016-03-06 (×4): 2 [IU] via SUBCUTANEOUS

## 2016-03-03 MED ORDER — POTASSIUM CHLORIDE CRYS ER 20 MEQ PO TBCR
30.0000 meq | EXTENDED_RELEASE_TABLET | Freq: Three times a day (TID) | ORAL | Status: DC
  Administered 2016-03-03 – 2016-03-06 (×11): 30 meq via ORAL
  Filled 2016-03-03 (×11): qty 1

## 2016-03-03 MED ORDER — DEXTROSE-NACL 5-0.45 % IV SOLN: INTRAVENOUS | Status: DC

## 2016-03-03 MED ORDER — SODIUM CHLORIDE 0.9% FLUSH
3.0000 mL | Freq: Two times a day (BID) | INTRAVENOUS | Status: DC
  Administered 2016-03-03 – 2016-03-07 (×6): 3 mL via INTRAVENOUS

## 2016-03-03 MED ORDER — ACETAMINOPHEN 325 MG PO TABS: 650.0000 mg | ORAL_TABLET | Freq: Four times a day (QID) | ORAL | Status: DC | PRN

## 2016-03-03 MED ORDER — ACETAMINOPHEN 650 MG RE SUPP: 650.0000 mg | Freq: Four times a day (QID) | RECTAL | Status: DC | PRN

## 2016-03-03 MED ORDER — HYDRALAZINE HCL 50 MG PO TABS
100.0000 mg | ORAL_TABLET | Freq: Three times a day (TID) | ORAL | Status: DC
  Administered 2016-03-03 – 2016-03-04 (×6): 100 mg via ORAL
  Filled 2016-03-03 (×7): qty 2

## 2016-03-03 MED ORDER — LEVOTHYROXINE SODIUM 100 MCG PO TABS
200.0000 ug | ORAL_TABLET | Freq: Every day | ORAL | Status: DC
  Administered 2016-03-03 – 2016-03-07 (×5): 200 ug via ORAL
  Filled 2016-03-03 (×5): qty 2

## 2016-03-03 MED ORDER — ATORVASTATIN CALCIUM 10 MG PO TABS
20.0000 mg | ORAL_TABLET | Freq: Every day | ORAL | Status: DC
  Administered 2016-03-03 – 2016-03-07 (×5): 20 mg via ORAL
  Filled 2016-03-03 (×5): qty 2

## 2016-03-03 MED ORDER — ISOSORBIDE MONONITRATE ER 30 MG PO TB24
90.0000 mg | ORAL_TABLET | Freq: Two times a day (BID) | ORAL | Status: DC
Start: 2016-03-03 — End: 2016-03-05
  Administered 2016-03-03 – 2016-03-04 (×4): 90 mg via ORAL
  Filled 2016-03-03 (×5): qty 3

## 2016-03-03 MED ORDER — DEXTROSE-NACL 5-0.45 % IV SOLN
INTRAVENOUS | Status: DC
  Administered 2016-03-03: 50 mL via INTRAVENOUS
  Administered 2016-03-04: 12:00:00 via INTRAVENOUS

## 2016-03-03 MED ORDER — TORSEMIDE 20 MG PO TABS: 40.0000 mg | ORAL_TABLET | ORAL | Status: DC

## 2016-03-03 MED ORDER — AMLODIPINE BESYLATE 10 MG PO TABS
10.0000 mg | ORAL_TABLET | Freq: Every day | ORAL | Status: DC
  Administered 2016-03-03 – 2016-03-04 (×2): 10 mg via ORAL
  Filled 2016-03-03 (×3): qty 1

## 2016-03-03 MED ORDER — TORSEMIDE 20 MG PO TABS
20.0000 mg | ORAL_TABLET | ORAL | Status: DC
  Administered 2016-03-03: 20 mg via ORAL
  Filled 2016-03-03: qty 1

## 2016-03-03 MED ORDER — MOMETASONE FURO-FORMOTEROL FUM 200-5 MCG/ACT IN AERO
2.0000 | INHALATION_SPRAY | Freq: Two times a day (BID) | RESPIRATORY_TRACT | Status: DC
  Administered 2016-03-03 – 2016-03-06 (×8): 2 via RESPIRATORY_TRACT
  Filled 2016-03-03: qty 8.8

## 2016-03-03 MED ORDER — FERROUS SULFATE 325 (65 FE) MG PO TABS
325.0000 mg | ORAL_TABLET | Freq: Every day | ORAL | Status: DC
  Administered 2016-03-03 – 2016-03-07 (×5): 325 mg via ORAL
  Filled 2016-03-03 (×5): qty 1

## 2016-03-03 MED ORDER — INSULIN ASPART 100 UNIT/ML ~~LOC~~ SOLN
0.0000 [IU] | Freq: Every day | SUBCUTANEOUS | Status: DC
Start: 2016-03-03 — End: 2016-03-06

## 2016-03-03 MED ORDER — DEXTROSE 5 % IV SOLN
INTRAVENOUS | Status: DC
  Administered 2016-03-03: 02:00:00 via INTRAVENOUS

## 2016-03-03 MED ORDER — CARVEDILOL 6.25 MG PO TABS
6.2500 mg | ORAL_TABLET | Freq: Two times a day (BID) | ORAL | Status: DC
  Administered 2016-03-03 – 2016-03-07 (×9): 6.25 mg via ORAL
  Filled 2016-03-03 (×10): qty 1

## 2016-03-03 NOTE — Progress Notes (Signed)
ANTICOAGULATION CONSULT NOTE - Follow-Up  Pharmacy Consult for Warfarin Indication: atrial fibrillation  Allergies  Allergen Reactions  . Levothyroxine Sodium Other (See Comments)    MUST TAKE BRAND NAME GENERIC DOESN'T WORK FOR PATIENT  . Chicken Protein Nausea Only and Other (See Comments)    Does not like chicken or [place]  . Eggs Or Egg-Derived Products Nausea Only    Does not like eggs    Patient Measurements: Height: 6' 2.5" (189.2 cm) Weight: 181 lb 3.5 oz (82.2 kg) IBW/kg (Calculated) : 83.35   Vital Signs: Temp: 98.2 F (36.8 C) (03/15 1303) Temp Source: Oral (03/15 1303) BP: 118/82 mmHg (03/15 1303) Pulse Rate: 104 (03/15 1303)  Labs:  Recent Labs  03/02/16 2227 03/03/16 0513 03/03/16 1341  HGB 11.0* 9.6*  --   HCT 36.0* 31.5*  --   PLT 158 164  --   LABPROT 17.9*  --  19.5*  INR 1.47  --  1.65*  CREATININE 2.10* 2.13* 2.30*  TROPONINI 0.24* 0.44*  --     Estimated Creatinine Clearance: 31.8 mL/min (by C-G formula based on Cr of 2.3).   Medical History: Past Medical History  Diagnosis Date  . Gout   . HTN (hypertension)   . Hyperlipidemia   . Osteoarthritis   . History of CVA (cerebrovascular accident)     Left pontine infarct July 2004; changed from Plavix to Coumadin in 2004 per MD at [facility]  . GERD (gastroesophageal reflux disease)   . DJD (degenerative joint disease)   . BPH (benign prostatic hypertrophy)   . Peripheral vascular disease (HCC)   . Bilateral leg ulcer (HCC)     ACHILLES AREA--  NONHEALING  . CKD (chronic kidney disease) stage 3, GFR 30-59 ml/min   . CAD (coronary artery disease)     a. LHC 4/12: Mid LAD 25%, mid diagonal 30%, AV circumflex 40%, proximal OM 25%, distal RCA 40%  . Chronic combined systolic and diastolic heart failure (HCC)     a. Echo 05/2013: EF 25-30%, diffuse HK, restrictive physiology, trivial AI, mild MR, moderate LAE, reduced RV systolic function, PASP 42  . LBBB (left bundle branch block)   . PAF  (paroxysmal atrial fibrillation) (HCC)     a. amiodarone Rx started 05/2013;  b. chronic coumadin  . NICM (nonischemic cardiomyopathy) (HCC)     a. EF 25-30%.  [name] Hx of cardiovascular stress test     Nuclear Stress Test (1/16): High risk stress nuclear study with a large, severe, partially reversible inferior and apical defect consistent with prior inferior and apical infarct; mild apical ischemia; severe LVE; study high risk due to reduced LV function.  EF 25% >> reviewed with Dr. Antoine Poche >> medical mgmt  . Hypothyroidism   . DM (diabetes mellitus), type 2 (HCC)   . Presence of permanent cardiac pacemaker   . AICD (automatic cardioverter/defibrillator) present   . Stroke (HCC)   . CHF (congestive heart failure) (HCC)   . Shortness of breath dyspnea   . Pneumonia   . Depression     Medications:  Prescriptions prior to admission  Medication Sig Dispense Refill Last Dose  . amLODipine (NORVASC) 10 MG tablet TAKE 1 TABLET BY MOUTH EVERY DAY (Patient taking differently: TAKE 10 MG BY MOUTH EVERY DAY) 30 tablet 6 03/01/2016 at Unknown time  . atorvastatin (LIPITOR) 20 MG tablet Take 1 tablet (20 mg total) by mouth daily. 90 tablet 3 03/01/2016 at Unknown time  . carvedilol (COREG) 6.25 MG tablet  Take 1 tablet (6.25 mg total) by mouth 2 (two) times daily with a meal. 60 tablet 6 03/01/2016 at 2100  . cholecalciferol (VITAMIN D) 1000 units tablet Take 1 tablet (1,000 Units total) by mouth daily. 30 tablet 11 03/01/2016 at Unknown time  . [facility] VITAMIN B12 1000 MCG tablet TAKE 1 TABLET BY MOUTH DAILY (Patient taking differently: TAKE 1000 MCG BY MOUTH DAILY) 100 tablet 1 03/01/2016 at Unknown time  . escitalopram (LEXAPRO) 5 MG tablet TAKE 1 TABLET BY MOUTH EVERY DAY (Patient taking differently: TAKE 5 MG BY MOUTH EVERY DAY) 30 tablet 3 03/01/2016 at Unknown time  . ferrous sulfate 325 (65 FE) MG tablet Take 1 tablet (325 mg total) by mouth daily. 30 tablet 11 03/01/2016 at Unknown time  . glipiZIDE  (GLUCOTROL) 5 MG tablet TAKE 1/2 TABLET BY MOUTH TWICE A DAY BEFORE A MEAL 30 tablet 1 03/01/2016 at Unknown time  . hydrALAZINE (APRESOLINE) 100 MG tablet Take 1 tablet (100 mg total) by mouth 3 (three) times daily. 90 tablet 5 03/01/2016 at Unknown time  . isosorbide mononitrate (IMDUR) 60 MG 24 hr tablet TAKE 1.5 TABLETS (90 MG TOTAL) BY MOUTH TWICE A DAY. 45 tablet 5 03/01/2016 at Unknown time  . OXYGEN Inhale 2.5 L into the lungs See admin instructions. USES OXYGEN EVERY BEDTIME USES DURING DAY ONLY AS NEEDED   03/02/2016 at Unknown time  . potassium chloride SA (K-DUR,KLOR-CON) 20 MEQ tablet Take 1.5 tablets (30 mEq total) by mouth 3 (three) times daily.   03/01/2016 at Unknown time  . SYMBICORT 160-4.5 MCG/ACT inhaler INHALE 2 PUFFS INTO THE LUNGS 2 TIMES DAILY 10.2 Inhaler 5 03/01/2016 at Unknown time  . SYNTHROID 100 MCG tablet TAKE 2 TABLETS BY MOUTH EVERY MORNING BEFORE BREAKFAST (Patient taking differently: TAKE 200 MCG BY MOUTH EVERY MORNING BEFORE BREAKFAST) 60 tablet 11 03/01/2016 at Unknown time  . torsemide (DEMADEX) 20 MG tablet Take 20-40 mg by mouth as directed. Take 40 mg once daily alternating with 20 mg the next day continuously  6 03/01/2016 at Unknown time  . warfarin (COUMADIN) 5 MG tablet TAKE 1 TABLET (5 MG TOTAL) BY MOUTH AS DIRECTED. (Patient taking differently: Take 2.5 mg by mouth monday, wednesday, friday and 5 mg on all other days) 30 tablet 0 03/01/2016 at 2100  . HYDROcodone-acetaminophen (NORCO/VICODIN) 5-325 MG per tablet Take 1 tablet by mouth every 6 (six) hours as needed for moderate pain. (Patient not taking: Reported on 03/02/2016) 60 tablet 0 Completed Course at Unknown time  . tamsulosin (FLOMAX) 0.4 MG CAPS capsule Take 1 capsule (0.4 mg total) by mouth daily. (Patient not taking: Reported on 03/02/2016) 30 capsule 1 Completed Course at Unknown time  . triamcinolone cream (KENALOG) 0.5 % Apply topically 2 (two) times daily. (Patient not taking: Reported on 03/02/2016) 60  g 2 Not Taking at Unknown time   Scheduled:  . amLODipine  10 mg Oral Daily  . atorvastatin  20 mg Oral Daily  . carvedilol  6.25 mg Oral BID WC  . escitalopram  5 mg Oral Daily  . ferrous sulfate  325 mg Oral Daily  . hydrALAZINE  100 mg Oral TID  . insulin aspart  0-15 Units Subcutaneous TID WC  . insulin aspart  0-5 Units Subcutaneous QHS  . isosorbide mononitrate  90 mg Oral BID  . levothyroxine  200 mcg Oral QAC breakfast  . mometasone-formoterol  2 puff Inhalation BID  . potassium chloride SA  30 mEq Oral TID  .  sodium chloride flush  3 mL Intravenous Q12H  . torsemide  20 mg Oral QODAY  . [START ON 03/04/2016] torsemide  40 mg Oral QODAY  . warfarin  5 mg Oral ONCE-1800  . Warfarin - Pharmacist Dosing Inpatient   Does not apply q1800   Infusions:  . dextrose 100 mL/hr at 03/03/16 0137    Assessment: 74 yoM s/p fall found to  Have hypernatremia.  On chronic warfarin for A-fib.  HD 2.5 mg M/W/F and 5mg  Tues/Thur/Sat/Sun. LD 3/13 @ 2100.  INR = 1.47 on admission. [place]=3.8  Warfarin per Rx. Goal of Therapy:  INR 2-3   03/03/2016  INR is 1.65, Pt last received dose about 12 hours prior to level being drawn.  Hgb dropped slightly, will continue to monitor and follow  PT/INR ordered with AM labs    Plan:   Warfarin 5mg  x1   Follow PT/INR, hgb trends  Adalberto Cole, PharmD, BCPS Pager [phone] 03/03/2016 3:16 PM

## 2016-03-03 NOTE — Progress Notes (Signed)
Patient Demographics  Frank Mclean, is a 75 y.o. male, DOB - 11-02-1941, ZOX:[id number]  Admit date - 03/02/2016   Admitting Physician Alberteen Sam, MD  Outpatient Primary MD for the patient is Frank Primes, MD  LOS - 1   Chief Complaint  Patient presents with  . Fall       Admission HPI/Brief narrative: 75 y.o. male with a past medical history significant for dementia, NICM and systolic and diastolic CHF EF 81% with ICD/pacer, CKD III, NIDDM, hypothyroidism, pAF on warfarin, and hx of stroke who presents with fall and altered mental status, workup was significant for hypernatremia and dehydration. Subjective:   [name] today has, No headache, No chest pain, No abdominal pain - No Nausea, report generalized weakness, no further diarrhea since admission  Assessment & Plan    Principal Problem:   Hypernatremia Active Problems:   DM (diabetes mellitus), type 2 with renal complications (HCC)   CKD (chronic kidney disease) stage 3, GFR 30-59 ml/min   Memory loss   Anticoagulated on Coumadin   NICM (nonischemic cardiomyopathy) (HCC)   Anemia in chronic renal disease   Chronic depression   PAF (paroxysmal atrial fibrillation) (HCC)   Hypothyroidism   Elevated troponin   Chronic combined systolic and diastolic CHF (congestive heart failure) (HCC)   Hyperbilirubinemia  Full and Generalized weakness  - This is most likely related to hypernatremia , will continue with gentle hydration, will check urinalysis . - Patient reports diarrhea, will check C. difficile and GI panel by PCR  Hypernatremia:  - This is new. Likely this is the cause of his fall and encephalopathy. Improved with D5W, will change to D5 half-normal saline at a lower rate  Elevated troponin - This is chronic. Likely related to cardiomegaly. - Recheck in a.m.   Hyperbilirubinemia:  Unclear etiology.  Significance unclear. - Resolved with hydration   CKD stage III:  Stable   Hypokalemia:  Takes 30 mEq TID reportedly at home -Continue (with one dose now) and trend BMP -Goal K > 4  Anemia of renal disease:  At baseline  Chronic systolic and diastolic CHF:  No rales, overt edema on CXR or marked LE edema, but likely fluid overloaded (dry weight reported ~205lbs per family). -Check BNP -Strict I/Os, daily weights -Continue home torsemide alternating 20 and 40 mg daily, monitor Na closely, but d/c or decrease if sodium correction is slow  Hypothyroidism: -Check TSH -Continue synthroid  NIDDM: -Hold glipizide -Monitor glucose while on D5W for #1 -Sliding scale insulin with meals   Delirium and dementia: There appears to be a gradual progrsesion of his dementia superimposed on his acute hypernatremia and encephalopathy. [name] brother in law discussed with me in private that his sister has resisted coming to terms with the patient's dementia, and may benefit from additional assistance in the home. Furthermore, repeat OT assessment and cognitive testing/MMSE? may be helpful in documenting progression of the patient's dementia.  Atrial fibrillation: CHADS2Vasc 7. -Continue warfarin and BB -Obtain CT head without contrast to assess for subdural if there is any worsening change in mental status    Code Status: DNR  Family Communication: none at bedside  Disposition Plan: Pending PT consult   Procedures  none  Consults   none   Medications  Scheduled Meds: . amLODipine  10 mg Oral Daily  . atorvastatin  20 mg Oral Daily  . carvedilol  6.25 mg Oral BID WC  . escitalopram  5 mg Oral Daily  . ferrous sulfate  325 mg Oral Daily  . hydrALAZINE  100 mg Oral TID  . insulin aspart  0-15 Units Subcutaneous TID WC  . insulin aspart  0-5 Units Subcutaneous QHS  . isosorbide mononitrate  90 mg Oral BID  . levothyroxine  200 mcg Oral QAC breakfast  .  mometasone-formoterol  2 puff Inhalation BID  . potassium chloride SA  30 mEq Oral TID  . sodium chloride flush  3 mL Intravenous Q12H  . torsemide  20 mg Oral QODAY  . [START ON 03/04/2016] torsemide  40 mg Oral QODAY  . warfarin  5 mg Oral ONCE-1800  . Warfarin - Pharmacist Dosing Inpatient   Does not apply q1800   Continuous Infusions: . dextrose 100 mL/hr at 03/03/16 0137   PRN Meds:.acetaminophen **OR** acetaminophen  DVT Prophylaxis  warfarin  Lab Results  Component Value Date   PLT 164 03/03/2016    Antibiotics    Anti-infectives    None          Objective:   Filed Vitals:   03/03/16 0844 03/03/16 0922 03/03/16 1039 03/03/16 1303  BP: 98/82  114/78 118/82  Pulse: 72 87  104  Temp:    98.2 F (36.8 C)  TempSrc:    Oral  Resp:  18  20  Height:      Weight:      SpO2:  99%  95%    Wt Readings from Last 3 Encounters:  03/03/16 82.2 kg (181 lb 3.5 oz)  11/26/15 95.618 kg (210 lb 12.8 oz)  09/10/15 91.173 kg (201 lb)    No intake or output data in the 24 hours ending 03/03/16 1520   Physical Exam  Awake Alert, communicative Supple Neck,No JVD Symmetrical Chest wall movement, Good air movement bilaterally, CTAB ,No Gallops,Rubs or new Murmurs, No Parasternal Heave +ve B.Sounds, Abd Soft, No tenderness, No organomegaly appriciated, No rebound - guarding or rigidity. No Cyanosis, Clubbing or edema, No new Rash or bruise    Data Review   Micro Results No results found for this or any previous visit (from the past 240 hour(s)).  Radiology Reports Dg Chest Port 1 View  03/02/2016  CLINICAL DATA:  Fall this morning, now with weakness. EXAM: PORTABLE CHEST 1 VIEW COMPARISON:  None. FINDINGS: There is marked cardiomegaly. Left chest wall pacemaker/AICD in place without evidence of wire discontinuity. There is mild central pulmonary vascular congestion without frank alveolar pulmonary edema. Lungs are otherwise clear. Small portion of the right  costophrenic angle is excluded on this exam. Osseous structures about the chest are unremarkable. IMPRESSION: 1. Marked cardiomegaly. Mild central pulmonary vascular congestion, probably chronic, without overt pulmonary edema. 2. Lungs are otherwise clear. No evidence of pneumonia or pleural effusion. This exam was interpreted during a PACS downtime with limited availability of comparison cases. It has been flagged for review following the downtime. If clinically indicated after this review, an addendum will be issued providing details about comparison to prior imaging. Electronically Signed   By: Bary Richard M.D.   On: 03/02/2016 22:23     CBC  Recent Labs Lab 03/02/16 2227 03/03/16 0513  WBC 8.1 6.6  HGB 11.0* 9.6*  HCT 36.0* 31.5*  PLT 158 164  MCV 81.6 79.7  MCH 24.9* 24.3*  MCHC 30.6 30.5  RDW 18.4* 18.2*  LYMPHSABS 0.2*  --   MONOABS 0.3  --   EOSABS 0.0  --   BASOSABS 0.0  --     Chemistries   Recent Labs Lab 03/02/16 2227 03/03/16 0513 03/03/16 1341  NA 155* 152* 145  K 3.0* 3.1* 3.4*  CL 119* 118* 116*  CO2 GLUCOSE 127* 148* 134*  BUN 28* 30* 27*  CREATININE 2.10* 2.13* 2.30*  CALCIUM 9.4 8.9 8.8*  MG  --  1.9  --   AST 24 17  --   ALT 28 23  --   ALKPHOS 124 100  --   BILITOT 1.7* 0.9  --    ------------------------------------------------------------------------------------------------------------------ estimated creatinine clearance is 31.8 mL/min (by C-G formula based on Cr of 2.3). ------------------------------------------------------------------------------------------------------------------ No results for input(s): HGBA1C in the last 72 hours. ------------------------------------------------------------------------------------------------------------------ No results for input(s): CHOL, HDL, LDLCALC, TRIG, CHOLHDL, LDLDIRECT in the last 72  hours. ------------------------------------------------------------------------------------------------------------------  Recent Labs  03/03/16 0513  TSH 0.018*   ------------------------------------------------------------------------------------------------------------------ No results for input(s): VITAMINB12, FOLATE, FERRITIN, TIBC, IRON, RETICCTPCT in the last 72 hours.  Coagulation profile  Recent Labs Lab 03/02/16 2227 03/03/16 1341  INR 1.47 1.65*    No results for input(s): DDIMER in the last 72 hours.  Cardiac Enzymes  Recent Labs Lab 03/02/16 2227 03/03/16 0513  TROPONINI 0.24* 0.44*   ------------------------------------------------------------------------------------------------------------------ Invalid input(s): POCBNP     Time Spent in minutes   No Sherald Hess, Bonney Berres M.D on 03/03/2016 at 3:20 PM  Between 7am to 7pm - Pager - [phone]  After 7pm go to [email or web address] - password [facility]  [facility]   Office  [phone]
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
## 2016-03-03 NOTE — Plan of Care (Signed)
Problem: Education: Goal: Knowledge of [facility] Education information/materials will improve Outcome: Completed/Met Date Met:  03/03/16 Education provided to pt, spouse and brother in law regarding treatment plan     

## 2016-03-03 NOTE — Progress Notes (Signed)
ANTICOAGULATION CONSULT NOTE - Initial Consult  Pharmacy Consult for Warfarin Indication: atrial fibrillation  Allergies  Allergen Reactions  . Levothyroxine Sodium Other (See Comments)    MUST TAKE BRAND NAME GENERIC DOESN'T WORK FOR PATIENT  . Chicken Protein Nausea Only and Other (See Comments)    Does not like chicken or [place]  . Eggs Or Egg-Derived Products Nausea Only    Does not like eggs    Patient Measurements: Height: 6' 2.5" (189.2 cm) Weight: 196 lb 10.4 oz (89.2 kg) IBW/kg (Calculated) : 83.35   Vital Signs: Temp: 101 F (38.3 C) (03/15 0109) Temp Source: Oral (03/15 0109) BP: 117/79 mmHg (03/15 0109) Pulse Rate: 105 (03/15 0109)  Labs:  Recent Labs  03/02/16 2227  HGB 11.0*  HCT 36.0*  PLT 158  LABPROT 17.9*  INR 1.47  CREATININE 2.10*  TROPONINI 0.24*    Estimated Creatinine Clearance: 35.3 mL/min (by C-G formula based on Cr of 2.1).   Medical History: Past Medical History  Diagnosis Date  . Gout   . HTN (hypertension)   . Hyperlipidemia   . Osteoarthritis   . History of CVA (cerebrovascular accident)     Left pontine infarct July 2004; changed from Plavix to Coumadin in 2004 per MD at [facility]  . GERD (gastroesophageal reflux disease)   . DJD (degenerative joint disease)   . BPH (benign prostatic hypertrophy)   . Peripheral vascular disease (HCC)   . Bilateral leg ulcer (HCC)     ACHILLES AREA--  NONHEALING  . CKD (chronic kidney disease) stage 3, GFR 30-59 ml/min   . CAD (coronary artery disease)     a. LHC 4/12: Mid LAD 25%, mid diagonal 30%, AV circumflex 40%, proximal OM 25%, distal RCA 40%  . Chronic combined systolic and diastolic heart failure (HCC)     a. Echo 05/2013: EF 25-30%, diffuse HK, restrictive physiology, trivial AI, mild MR, moderate LAE, reduced RV systolic function, PASP 42  . LBBB (left bundle branch block)   . PAF (paroxysmal atrial fibrillation) (HCC)     a. amiodarone Rx started 05/2013;  b. chronic coumadin   . NICM (nonischemic cardiomyopathy) (HCC)     a. EF 25-30%.  [name] Hx of cardiovascular stress test     Nuclear Stress Test (1/16): High risk stress nuclear study with a large, severe, partially reversible inferior and apical defect consistent with prior inferior and apical infarct; mild apical ischemia; severe LVE; study high risk due to reduced LV function.  EF 25% >> reviewed with Dr. Antoine Poche >> medical mgmt  . Hypothyroidism   . DM (diabetes mellitus), type 2 (HCC)   . Presence of permanent cardiac pacemaker   . AICD (automatic cardioverter/defibrillator) present   . Stroke (HCC)   . CHF (congestive heart failure) (HCC)   . Shortness of breath dyspnea   . Pneumonia   . Depression     Medications:  Prescriptions prior to admission  Medication Sig Dispense Refill Last Dose  . amLODipine (NORVASC) 10 MG tablet TAKE 1 TABLET BY MOUTH EVERY DAY (Patient taking differently: TAKE 10 MG BY MOUTH EVERY DAY) 30 tablet 6 03/01/2016 at Unknown time  . atorvastatin (LIPITOR) 20 MG tablet Take 1 tablet (20 mg total) by mouth daily. 90 tablet 3 03/01/2016 at Unknown time  . carvedilol (COREG) 6.25 MG tablet Take 1 tablet (6.25 mg total) by mouth 2 (two) times daily with a meal. 60 tablet 6 03/01/2016 at 2100  . cholecalciferol (VITAMIN D) 1000 units tablet  Take 1 tablet (1,000 Units total) by mouth daily. 30 tablet 11 03/01/2016 at Unknown time  . CVS VITAMIN B12 1000 MCG tablet TAKE 1 TABLET BY MOUTH DAILY (Patient taking differently: TAKE 1000 MCG BY MOUTH DAILY) 100 tablet 1 03/01/2016 at Unknown time  . escitalopram (LEXAPRO) 5 MG tablet TAKE 1 TABLET BY MOUTH EVERY DAY (Patient taking differently: TAKE 5 MG BY MOUTH EVERY DAY) 30 tablet 3 03/01/2016 at Unknown time  . ferrous sulfate 325 (65 FE) MG tablet Take 1 tablet (325 mg total) by mouth daily. 30 tablet 11 03/01/2016 at Unknown time  . glipiZIDE (GLUCOTROL) 5 MG tablet TAKE 1/2 TABLET BY MOUTH TWICE A DAY BEFORE A MEAL 30 tablet 1 03/01/2016 at  Unknown time  . hydrALAZINE (APRESOLINE) 100 MG tablet Take 1 tablet (100 mg total) by mouth 3 (three) times daily. 90 tablet 5 03/01/2016 at Unknown time  . isosorbide mononitrate (IMDUR) 60 MG 24 hr tablet TAKE 1.5 TABLETS (90 MG TOTAL) BY MOUTH TWICE A DAY. 45 tablet 5 03/01/2016 at Unknown time  . OXYGEN Inhale 2.5 L into the lungs See admin instructions. USES OXYGEN EVERY BEDTIME USES DURING DAY ONLY AS NEEDED   03/02/2016 at Unknown time  . potassium chloride SA (K-DUR,KLOR-CON) 20 MEQ tablet Take 1.5 tablets (30 mEq total) by mouth 3 (three) times daily.   03/01/2016 at Unknown time  . SYMBICORT 160-4.5 MCG/ACT inhaler INHALE 2 PUFFS INTO THE LUNGS 2 TIMES DAILY 10.2 Inhaler 5 03/01/2016 at Unknown time  . SYNTHROID 100 MCG tablet TAKE 2 TABLETS BY MOUTH EVERY MORNING BEFORE BREAKFAST (Patient taking differently: TAKE 200 MCG BY MOUTH EVERY MORNING BEFORE BREAKFAST) 60 tablet 11 03/01/2016 at Unknown time  . torsemide (DEMADEX) 20 MG tablet Take 20-40 mg by mouth as directed. Take 40 mg once daily alternating with 20 mg the next day continuously  6 03/01/2016 at Unknown time  . warfarin (COUMADIN) 5 MG tablet TAKE 1 TABLET (5 MG TOTAL) BY MOUTH AS DIRECTED. (Patient taking differently: Take 2.5 mg by mouth monday, wednesday, friday and 5 mg on all other days) 30 tablet 0 03/01/2016 at 2100  . HYDROcodone-acetaminophen (NORCO/VICODIN) 5-325 MG per tablet Take 1 tablet by mouth every 6 (six) hours as needed for moderate pain. (Patient not taking: Reported on 03/02/2016) 60 tablet 0 Completed Course at Unknown time  . tamsulosin (FLOMAX) 0.4 MG CAPS capsule Take 1 capsule (0.4 mg total) by mouth daily. (Patient not taking: Reported on 03/02/2016) 30 capsule 1 Completed Course at Unknown time  . triamcinolone cream (KENALOG) 0.5 % Apply topically 2 (two) times daily. (Patient not taking: Reported on 03/02/2016) 60 g 2 Not Taking at Unknown time   Scheduled:  . amLODipine  10 mg Oral Daily  . atorvastatin  20  mg Oral Daily  . carvedilol  6.25 mg Oral BID WC  . escitalopram  5 mg Oral Daily  . ferrous sulfate  325 mg Oral Daily  . hydrALAZINE  100 mg Oral TID  . insulin aspart  0-15 Units Subcutaneous TID WC  . insulin aspart  0-5 Units Subcutaneous QHS  . isosorbide mononitrate  90 mg Oral BID  . levothyroxine  200 mcg Oral QAC breakfast  . mometasone-formoterol  2 puff Inhalation BID  . potassium chloride SA  30 mEq Oral TID  . sodium chloride flush  3 mL Intravenous Q12H  . torsemide  20 mg Oral QODAY  . [START ON 03/04/2016] torsemide  40 mg Oral QODAY  .  warfarin  5 mg Oral Once  . Warfarin - Pharmacist Dosing Inpatient   Does not apply q1800   Infusions:  . dextrose      Assessment: 74 yoM s/p fall found to  Have hypernatremia.  On chronic warfarin for A-fib.  HD 2.5 mg M/W/F and 5mg  Tues/Thur/Sat/Sun. LD 3/13 @ 2100.  INR = 1.47 on admission. [place]=3.8  Warfarin per Rx. Goal of Therapy:  INR 2-3    Plan:   Warfarin 5mg  x1 now  Daily PT/INR  Education  Lorenza Evangelist 03/03/2016,1:14 AM

## 2016-03-04 ENCOUNTER — Inpatient Hospital Stay (HOSPITAL_COMMUNITY): Payer: Medicare Other

## 2016-03-04 DIAGNOSIS — I429 Cardiomyopathy, unspecified: Secondary | ICD-10-CM

## 2016-03-04 DIAGNOSIS — D631 Anemia in chronic kidney disease: Secondary | ICD-10-CM

## 2016-03-04 DIAGNOSIS — N183 Chronic kidney disease, stage 3 (moderate): Secondary | ICD-10-CM

## 2016-03-04 DIAGNOSIS — E1122 Type 2 diabetes mellitus with diabetic chronic kidney disease: Secondary | ICD-10-CM

## 2016-03-04 DIAGNOSIS — R413 Other amnesia: Secondary | ICD-10-CM

## 2016-03-04 DIAGNOSIS — I480 Paroxysmal atrial fibrillation: Secondary | ICD-10-CM

## 2016-03-04 DIAGNOSIS — N189 Chronic kidney disease, unspecified: Secondary | ICD-10-CM

## 2016-03-04 DIAGNOSIS — I5042 Chronic combined systolic (congestive) and diastolic (congestive) heart failure: Secondary | ICD-10-CM

## 2016-03-04 DIAGNOSIS — N184 Chronic kidney disease, stage 4 (severe): Secondary | ICD-10-CM

## 2016-03-04 DIAGNOSIS — Z7901 Long term (current) use of anticoagulants: Secondary | ICD-10-CM

## 2016-03-04 DIAGNOSIS — E039 Hypothyroidism, unspecified: Secondary | ICD-10-CM

## 2016-03-04 DIAGNOSIS — Z5181 Encounter for therapeutic drug level monitoring: Secondary | ICD-10-CM

## 2016-03-04 DIAGNOSIS — R7989 Other specified abnormal findings of blood chemistry: Secondary | ICD-10-CM

## 2016-03-04 LAB — BASIC METABOLIC PANEL
Anion gap: 8 (ref 5–15)
BUN: 26 mg/dL — AB (ref 6–20)
CHLORIDE: 113 mmol/L — AB (ref 101–111)
CO2: 24 mmol/L (ref 22–32)
CREATININE: 2.32 mg/dL — AB (ref 0.61–1.24)
Calcium: 8.4 mg/dL — ABNORMAL LOW (ref 8.9–10.3)
GFR calc Af Amer: 30 mL/min — ABNORMAL LOW (ref >60)
GFR calc non Af Amer: 26 mL/min — ABNORMAL LOW (ref >60)
GLUCOSE: 131 mg/dL — AB (ref 65–99)
POTASSIUM: 3.9 mmol/L (ref 3.5–5.1)
SODIUM: 145 mmol/L (ref 135–145)

## 2016-03-04 LAB — PROTIME-INR
INR: 1.98 — ABNORMAL HIGH (ref 0.00–1.49)
Prothrombin Time: 22.4 seconds — ABNORMAL HIGH (ref 11.6–15.2)

## 2016-03-04 LAB — CBC
HEMATOCRIT: 30.5 % — AB (ref 39.0–52.0)
HEMOGLOBIN: 9 g/dL — AB (ref 13.0–17.0)
MCH: 24.7 pg — AB (ref 26.0–34.0)
MCHC: 29.5 g/dL — AB (ref 30.0–36.0)
MCV: 83.8 fL (ref 78.0–100.0)
Platelets: 139 10*3/uL — ABNORMAL LOW (ref 150–400)
RBC: 3.64 MIL/uL — ABNORMAL LOW (ref 4.22–5.81)
RDW: 18.6 % — ABNORMAL HIGH (ref 11.5–15.5)
WBC: 8.1 10*3/uL (ref 4.0–10.5)

## 2016-03-04 LAB — HEMOGLOBIN A1C
HEMOGLOBIN A1C: 5.7 % — AB (ref 4.8–5.6)
MEAN PLASMA GLUCOSE: 117 mg/dL

## 2016-03-04 LAB — GLUCOSE, CAPILLARY
GLUCOSE-CAPILLARY: 134 mg/dL — AB (ref 65–99)
GLUCOSE-CAPILLARY: 106 mg/dL — AB (ref 65–99)
Glucose-Capillary: 108 mg/dL — ABNORMAL HIGH (ref 65–99)
Glucose-Capillary: 108 mg/dL — ABNORMAL HIGH (ref 65–99)

## 2016-03-04 LAB — TROPONIN I: TROPONIN I: 0.21 ng/mL — AB (ref <0.031)

## 2016-03-04 MED ORDER — WARFARIN SODIUM 2.5 MG PO TABS
2.5000 mg | ORAL_TABLET | Freq: Once | ORAL | Status: AC
  Administered 2016-03-04: 2.5 mg via ORAL
  Filled 2016-03-04: qty 1

## 2016-03-04 MED ORDER — DEXTROSE 5 % IV SOLN
1.0000 g | INTRAVENOUS | Status: DC
  Administered 2016-03-04 – 2016-03-07 (×4): 1 g via INTRAVENOUS
  Filled 2016-03-04 (×5): qty 10

## 2016-03-04 MED ORDER — DEXTROSE 5 % IV SOLN
INTRAVENOUS | Status: DC
  Administered 2016-03-04 – 2016-03-05 (×3): via INTRAVENOUS

## 2016-03-04 MED ORDER — TORSEMIDE 20 MG PO TABS
20.0000 mg | ORAL_TABLET | Freq: Every day | ORAL | Status: DC
  Administered 2016-03-04 – 2016-03-05 (×2): 20 mg via ORAL
  Filled 2016-03-04 (×2): qty 1
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
## 2016-03-04 NOTE — Progress Notes (Addendum)
Patient Demographics  Frank Mclean, is a 75 y.o. male, DOB - 1941-06-08, KAJ:[id number]  Admit date - 03/02/2016   Admitting Physician Alberteen Sam, MD  Outpatient Primary MD for the patient is [name], MD  LOS - 2   Chief Complaint  Patient presents with  . Fall       Admission HPI/Brief narrative: 75 y.o. male with a past medical history significant for dementia, NICM and systolic and diastolic CHF EF 03% with ICD/pacer, CKD III, NIDDM, hypothyroidism, pAF on warfarin, and hx of stroke who presents with fall and altered mental status, workup was significant for hypernatremia and dehydration. Subjective:   Frank Mclean today has, No headache, No chest pain, No abdominal pain - No Nausea, report generalized weakness, no further diarrhea since admission  Assessment & Plan    Principal Problem:   Hypernatremia Active Problems:   DM (diabetes mellitus), type 2 with renal complications (HCC)   CKD (chronic kidney disease) stage 3, GFR 30-59 ml/min   Memory loss   Anticoagulated on Coumadin   NICM (nonischemic cardiomyopathy) (HCC)   Anemia in chronic renal disease   Chronic depression   PAF (paroxysmal atrial fibrillation) (HCC)   Hypothyroidism   Elevated troponin   Chronic combined systolic and diastolic CHF (congestive heart failure) (HCC)   Hyperbilirubinemia  Full and Generalized weakness  - This is most likely related to hypernatremia ,As well UTI,  will continue with gentle hydration. - Patient reports diarrhea,C. difficile and GI panel by PCR - CT head with no acute finding  UTI - Continue with IV Rocephin, follow on urine cultures  Hypernatremia:  - This is new. Likely this is the cause of his fall and encephalopathy. Initially on D5W, so transition to D5 half-normal saline with no significant improvement over last 24 hours, so we'll change back D5W .  Elevated  troponin - This is chronic. Likely related to cardiomegaly.   Hyperbilirubinemia:  - Unclear etiology. Significance unclear. - Resolved with hydration   Chronic kidney disease stage IV  Stable   Hypokalemia:  Takes 30 mEq TID reportedly at home -Continue (with one dose now) and trend BMP -Goal K > 4  Anemia of renal disease:  At baseline  Chronic systolic and diastolic CHF, with cardiomyopathy EF less than 30%. - Status post AICD placement  - No rales, overt edema on CXR or marked LE edema, but likely fluid overloaded (dry weight reported ~205lbs per family). - Strict I/Os, daily weights - Continue home torsemide alternating 20 and 40 mg daily, will continue to hold over next 24 hours - Telemetry showing few beats of nonsustained V. Tach, repeat electrolytes, oriented and beta blockers, already status post AICD.  Hypothyroidism: - Continue synthroid  NIDDM: - Hold glipizide, CBGs are controlled with insulin sliding scale   Delirium and dementia: There appears to be a gradual progrsesion of his dementia superimposed on his acute hypernatremia and encephalopathy. [name] brother in law discussed with me in private that his sister has resisted coming to terms with the patient's dementia, and may benefit from additional assistance in the home. Furthermore, repeat OT assessment and cognitive testing/MMSE? may be helpful in documenting progression of the patient's dementia.  Atrial fibrillation: - CHADS2Vasc 7. -  Continue warfarin and BB     Code Status: DNR  Family Communication: none at bedside  Disposition Plan: Pending PT consult   Procedures  none   Consults   none   Medications  Scheduled Meds: . amLODipine  10 mg Oral Daily  . atorvastatin  20 mg Oral Daily  . carvedilol  6.25 mg Oral BID WC  . cefTRIAXone (ROCEPHIN)  IV  1 g Intravenous Q24H  . escitalopram  5 mg Oral Daily  . ferrous sulfate  325 mg Oral Daily  . hydrALAZINE  100 mg  Oral TID  . insulin aspart  0-15 Units Subcutaneous TID WC  . insulin aspart  0-5 Units Subcutaneous QHS  . isosorbide mononitrate  90 mg Oral BID  . levothyroxine  200 mcg Oral QAC breakfast  . mometasone-formoterol  2 puff Inhalation BID  . potassium chloride SA  30 mEq Oral TID  . sodium chloride flush  3 mL Intravenous Q12H  . warfarin  2.5 mg Oral ONCE-1800  . Warfarin - Pharmacist Dosing Inpatient   Does not apply q1800   Continuous Infusions: . dextrose 5 % and 0.45% NaCl 50 mL/hr at 03/04/16 1203   PRN Meds:.acetaminophen **OR** acetaminophen  DVT Prophylaxis  warfarin  Lab Results  Component Value Date   PLT 139* 03/04/2016    Antibiotics    Anti-infectives    Start     Dose/Rate Route Frequency Ordered Stop   03/04/16 0800  cefTRIAXone (ROCEPHIN) 1 g in dextrose 5 % 50 mL IVPB     1 g 100 mL/hr over 30 Minutes Intravenous Every 24 hours 03/04/16 0741            Objective:   Filed Vitals:   03/04/16 0803 03/04/16 0846 03/04/16 1036 03/04/16 1434  BP: 108/79  110/71 102/72  Pulse: 82  75 84  Temp:    98 F (36.7 C)  TempSrc:    Oral  Resp:    18  Height:      Weight:      SpO2:  99% 99% 100%    Wt Readings from Last 3 Encounters:  03/04/16 92.2 kg (203 lb 4.2 oz)  11/26/15 95.618 kg (210 lb 12.8 oz)  09/10/15 91.173 kg (201 lb)     Intake/Output Summary (Last 24 hours) at 03/04/16 1706 Last data filed at 03/04/16 1107  Gross per 24 hour  Intake    400 ml  Output    650 ml  Net   -250 ml     Physical Exam  Awake Alert, communicative Supple Neck,No JVD Symmetrical Chest wall movement, Good air movement bilaterally, CTAB ,No Gallops,Rubs or new Murmurs, No Parasternal Heave +ve B.Sounds, Abd Soft, No tenderness, No organomegaly appriciated, No rebound - guarding or rigidity. No Cyanosis, Clubbing or edema, No new Rash or bruise    Data Review   Micro Results No results found for this or any previous visit (from the past 240  hour(s)).  Radiology Reports Ct Head Wo Contrast  03/04/2016  CLINICAL DATA:  Dementia/ s/p fall. Pt fell on 3-14, unknown LOC, pt denies any pain, on blood thinners, unknown if hit head, hx dementia EXAM: CT HEAD WITHOUT CONTRAST TECHNIQUE: Contiguous axial images were obtained from the base of the skull through the vertex without intravenous contrast. COMPARISON:  03/25/2015 FINDINGS: There is central and cortical atrophy. Periventricular white matter changes are consistent with small vessel disease. There is a small remote lacunar infarct in the right thalamus. Bone  windows demonstrate no focal edema or hematoma of the scalp. No calvarial fracture. There atherosclerotic calcification of the internal carotid arteries. IMPRESSION: 1. Atrophy and small vessel disease. 2. Remote right the laminae infarct. 3. No evidence for acute intracranial abnormality. Electronically Signed   By: Norva Pavlov M.D.   On: 03/04/2016 09:17   Dg Chest Port 1 View  03/02/2016  CLINICAL DATA:  Fall this morning, now with weakness. EXAM: PORTABLE CHEST 1 VIEW COMPARISON:  None. FINDINGS: There is marked cardiomegaly. Left chest wall pacemaker/AICD in place without evidence of wire discontinuity. There is mild central pulmonary vascular congestion without frank alveolar pulmonary edema. Lungs are otherwise clear. Small portion of the right costophrenic angle is excluded on this exam. Osseous structures about the chest are unremarkable. IMPRESSION: 1. Marked cardiomegaly. Mild central pulmonary vascular congestion, probably chronic, without overt pulmonary edema. 2. Lungs are otherwise clear. No evidence of pneumonia or pleural effusion. This exam was interpreted during a PACS downtime with limited availability of comparison cases. It has been flagged for review following the downtime. If clinically indicated after this review, an addendum will be issued providing details about comparison to prior imaging. Electronically Signed    By: Bary Richard M.D.   On: 03/02/2016 22:23     CBC  Recent Labs Lab 03/02/16 2227 03/03/16 0513 03/04/16 0503  WBC 8.1 6.6 8.1  HGB 11.0* 9.6* 9.0*  HCT 36.0* 31.5* 30.5*  PLT 158 164 139*  MCV 81.6 79.7 83.8  MCH 24.9* 24.3* 24.7*  MCHC 30.6 30.5 29.5*  RDW 18.4* 18.2* 18.6*  LYMPHSABS 0.2*  --   --   MONOABS 0.3  --   --   EOSABS 0.0  --   --   BASOSABS 0.0  --   --     Chemistries   Recent Labs Lab 03/02/16 2227 03/03/16 0513 03/03/16 1341 03/04/16 0503  NA 155* 152* 145 145  K 3.0* 3.1* 3.4* 3.9  CL 119* 118* 116* 113*  CO2 GLUCOSE 127* 148* 134* 131*  BUN 28* 30* 27* 26*  CREATININE 2.10* 2.13* 2.30* 2.32*  CALCIUM 9.4 8.9 8.8* 8.4*  MG  --  1.9  --   --   AST 24 17  --   --   ALT 28 23  --   --   ALKPHOS 124 100  --   --   BILITOT 1.7* 0.9  --   --    ------------------------------------------------------------------------------------------------------------------ estimated creatinine clearance is 32 mL/min (by C-G formula based on Cr of 2.32). ------------------------------------------------------------------------------------------------------------------  Recent Labs  03/03/16 0513  HGBA1C 5.7*   ------------------------------------------------------------------------------------------------------------------ No results for input(s): CHOL, HDL, LDLCALC, TRIG, CHOLHDL, LDLDIRECT in the last 72 hours. ------------------------------------------------------------------------------------------------------------------  Recent Labs  03/03/16 0513  TSH 0.018*   ------------------------------------------------------------------------------------------------------------------ No results for input(s): VITAMINB12, FOLATE, FERRITIN, TIBC, IRON, RETICCTPCT in the last 72 hours.  Coagulation profile  Recent Labs Lab 03/02/16 2227 03/03/16 1341 03/04/16 0503  INR 1.47 1.65* 1.98*    No results for input(s): DDIMER in the last 72  hours.  Cardiac Enzymes  Recent Labs Lab 03/02/16 2227 03/03/16 0513 03/04/16 0503  TROPONINI 0.24* 0.44* 0.21*   ------------------------------------------------------------------------------------------------------------------ Invalid input(s): POCBNP     Time Spent in minutes   30 minutes   ELGERGAWY, DAWOOD M.D on 03/04/2016 at 5:06 PM  Between 7am to 7pm - Pager - [phone]  After 7pm go to [email or web address] - password [facility]  [facility]   Office  [phone]      

## 2016-03-04 NOTE — Evaluation (Addendum)
Physical Therapy Evaluation Patient Details Name: Frank Mclean MRN: [id number] DOB: [birth year] Today's Date: 03/04/2016   History of Present Illness  75 y.o. male with a past medical history significant for dementia, NICM and systolic and diastolic CHF, ICD/pacer, CKD III, NIDDM, hypothyroidism, pAF on warfarin, and hx of stroke who presents with fall and altered mental status. Pt admitted for hypernatremia and dehydration  Clinical Impression  Pt admitted with above diagnosis. Pt currently with functional limitations due to the deficits listed below (see PT Problem List).  Pt will benefit from skilled PT to increase their independence and safety with mobility to allow discharge to the venue listed below.   Pt assisted with ambulation however with increase in distance, pt requiring more assist to steady (foward pitch, LE buckling, uncoordinated).  Recommend SNF upon d/c due to increased assist level and pt at HIGH risk for falls.     Follow Up Recommendations SNF;Supervision/Assistance - 24 hour;CIR (if not CIR, then SNF)    Equipment Recommendations  Wheelchair (measurements PT)    Recommendations for Other Services       Precautions / Restrictions Precautions Precautions: Fall      Mobility  Bed Mobility Overal bed mobility: Needs Assistance Bed Mobility: Supine to Sit     Supine to sit: Min guard;HOB elevated     General bed mobility comments: verbal cues for self assist  Transfers Overall transfer level: Needs assistance Equipment used: 1 person hand held assist Transfers: Sit to/from Stand Sit to Stand: Mod assist         General transfer comment: verbal cues for safe technique, assist to rise and steady, control descent  Ambulation/Gait Ambulation/Gait assistance: Mod assist;Max assist;+2 physical assistance Ambulation Distance (Feet): 70 Feet Assistive device: 1 person hand held assist;2 person hand held assist Gait Pattern/deviations: Step-through  pattern;Narrow base of support     General Gait Details: pt initially able to ambulate with 1 HHA and min assist however LEs became weaker and buckling as well as pt pitching forward requiring assist for balance to prevent fall, chair brought behind pt for safety, SpO2 90% on room air and HR 79 bpm  Stairs            Wheelchair Mobility    Modified Rankin (Stroke Patients Only)       Balance Overall balance assessment: Needs assistance         Standing balance support: Bilateral upper extremity supported;During functional activity Standing balance-Leahy Scale: Zero                               Pertinent Vitals/Pain Pain Assessment: No/denies pain    Home Living Family/patient expects to be discharged to:: Private residence Living Arrangements: Spouse/significant other             Home Equipment: Environmental consultant - 2 wheels;Cane - quad Additional Comments: pt poor historian    Prior Function Level of Independence: Needs assistance   Gait / Transfers Assistance Needed: Ambulates with quad cane     Comments: per chart review, spouse assists with meds     Hand Dominance        Extremity/Trunk Assessment   Upper Extremity Assessment: Generalized weakness           Lower Extremity Assessment: Generalized weakness         Communication      Cognition Arousal/Alertness: Awake/alert Behavior During Therapy: WFL for tasks assessed/performed Overall Cognitive Status:  No family/caregiver present to determine baseline cognitive functioning (hx dementia)                      General Comments      Exercises        Assessment/Plan    PT Assessment Patient needs continued PT services  PT Diagnosis Difficulty walking;Generalized weakness   PT Problem List Decreased strength;Decreased activity tolerance;Decreased mobility;Decreased balance;Decreased safety awareness;Decreased knowledge of use of DME;Decreased cognition;Decreased  coordination  PT Treatment Interventions Gait training;DME instruction;Functional mobility training;Patient/family education;Therapeutic activities;Therapeutic exercise;Balance training   PT Goals (Current goals can be found in the Care Plan section) Acute Rehab PT Goals PT Goal Formulation: With patient Time For Goal Achievement: 03/18/16 Potential to Achieve Goals: Good    Frequency Min 3X/week   Barriers to discharge        Co-evaluation               End of Session Equipment Utilized During Treatment: Gait belt Activity Tolerance: Patient limited by fatigue Patient left: in chair;with call bell/phone within reach;with chair alarm set Nurse Communication: Mobility status         Time: 1610-9604 PT Time Calculation (min) (ACUTE ONLY): 18 min   Charges:   PT Evaluation $PT Eval Moderate Complexity: 1 Procedure     PT G Codes:        Frank Mclean,Frank Mclean 03/04/2016, 4:35 PM Frank Mclean, PT, DPT 03/04/2016 Pager: [phone]

## 2016-03-04 NOTE — Progress Notes (Signed)
ANTICOAGULATION CONSULT NOTE - Follow-Up  Pharmacy Consult for Warfarin Indication: atrial fibrillation  Allergies  Allergen Reactions  . Levothyroxine Sodium Other (See Comments)    MUST TAKE BRAND NAME GENERIC DOESN'T WORK FOR PATIENT    Patient Measurements: Height: 6' 2.5" (189.2 cm) Weight: 203 lb 4.2 oz (92.2 kg) (patient stated weight to be between 200 to 205) IBW/kg (Calculated) : 83.35   Vital Signs: Temp: 98.3 F (36.8 C) (03/16 0448) Temp Source: Oral (03/16 0448) BP: 108/79 mmHg (03/16 0803) Pulse Rate: 82 (03/16 0803)  Labs:  Recent Labs  03/02/16 2227 03/03/16 0513 03/03/16 1341 03/04/16 0503  HGB 11.0* 9.6*  --  9.0*  HCT 36.0* 31.5*  --  30.5*  PLT 158 164  --  139*  LABPROT 17.9*  --  19.5* 22.4*  INR 1.47  --  1.65* 1.98*  CREATININE 2.10* 2.13* 2.30* 2.32*  TROPONINI 0.24* 0.44*  --  0.21*    Estimated Creatinine Clearance: 32 mL/min (by C-G formula based on Cr of 2.32).   Medical History: Past Medical History  Diagnosis Date  . Gout   . HTN (hypertension)   . Hyperlipidemia   . Osteoarthritis   . History of CVA (cerebrovascular accident)     Left pontine infarct July 2004; changed from Plavix to Coumadin in 2004 per MD at [facility]  . GERD (gastroesophageal reflux disease)   . DJD (degenerative joint disease)   . BPH (benign prostatic hypertrophy)   . Peripheral vascular disease (HCC)   . Bilateral leg ulcer (HCC)     ACHILLES AREA--  NONHEALING  . CKD (chronic kidney disease) stage 3, GFR 30-59 ml/min   . CAD (coronary artery disease)     a. LHC 4/12: Mid LAD 25%, mid diagonal 30%, AV circumflex 40%, proximal OM 25%, distal RCA 40%  . Chronic combined systolic and diastolic heart failure (HCC)     a. Echo 05/2013: EF 25-30%, diffuse HK, restrictive physiology, trivial AI, mild MR, moderate LAE, reduced RV systolic function, PASP 42  . LBBB (left bundle branch block)   . PAF (paroxysmal atrial fibrillation) (HCC)     a. amiodarone Rx  started 05/2013;  b. chronic coumadin  . NICM (nonischemic cardiomyopathy) (HCC)     a. EF 25-30%.  [name] Hx of cardiovascular stress test     Nuclear Stress Test (1/16): High risk stress nuclear study with a large, severe, partially reversible inferior and apical defect consistent with prior inferior and apical infarct; mild apical ischemia; severe LVE; study high risk due to reduced LV function.  EF 25% >> reviewed with Dr. Antoine Poche >> medical mgmt  . Hypothyroidism   . DM (diabetes mellitus), type 2 (HCC)   . Presence of permanent cardiac pacemaker   . AICD (automatic cardioverter/defibrillator) present   . Stroke (HCC)   . CHF (congestive heart failure) (HCC)   . Shortness of breath dyspnea   . Pneumonia   . Depression     Medications:  Prescriptions prior to admission  Medication Sig Dispense Refill Last Dose  . amLODipine (NORVASC) 10 MG tablet TAKE 1 TABLET BY MOUTH EVERY DAY (Patient taking differently: TAKE 10 MG BY MOUTH EVERY DAY) 30 tablet 6 03/01/2016 at Unknown time  . atorvastatin (LIPITOR) 20 MG tablet Take 1 tablet (20 mg total) by mouth daily. 90 tablet 3 03/01/2016 at Unknown time  . carvedilol (COREG) 6.25 MG tablet Take 1 tablet (6.25 mg total) by mouth 2 (two) times daily with a meal. 60  tablet 6 03/01/2016 at 2100  . cholecalciferol (VITAMIN D) 1000 units tablet Take 1 tablet (1,000 Units total) by mouth daily. 30 tablet 11 03/01/2016 at Unknown time  . [facility]S VITAMIN B12 1000 MCG tablet TAKE 1 TABLET BY MOUTH DAILY (Patient taking differently: TAKE 1000 MCG BY MOUTH DAILY) 100 tablet 1 03/01/2016 at Unknown time  . escitalopram (LEXAPRO) 5 MG tablet TAKE 1 TABLET BY MOUTH EVERY DAY (Patient taking differently: TAKE 5 MG BY MOUTH EVERY DAY) 30 tablet 3 03/01/2016 at Unknown time  . ferrous sulfate 325 (65 FE) MG tablet Take 1 tablet (325 mg total) by mouth daily. 30 tablet 11 03/01/2016 at Unknown time  . glipiZIDE (GLUCOTROL) 5 MG tablet TAKE 1/2 TABLET BY MOUTH TWICE A DAY BEFORE  A MEAL 30 tablet 1 03/01/2016 at Unknown time  . hydrALAZINE (APRESOLINE) 100 MG tablet Take 1 tablet (100 mg total) by mouth 3 (three) times daily. 90 tablet 5 03/01/2016 at Unknown time  . isosorbide mononitrate (IMDUR) 60 MG 24 hr tablet TAKE 1.5 TABLETS (90 MG TOTAL) BY MOUTH TWICE A DAY. 45 tablet 5 03/01/2016 at Unknown time  . OXYGEN Inhale 2.5 L into the lungs See admin instructions. USES OXYGEN EVERY BEDTIME USES DURING DAY ONLY AS NEEDED   03/02/2016 at Unknown time  . potassium chloride SA (K-DUR,KLOR-CON) 20 MEQ tablet Take 1.5 tablets (30 mEq total) by mouth 3 (three) times daily.   03/01/2016 at Unknown time  . SYMBICORT 160-4.5 MCG/ACT inhaler INHALE 2 PUFFS INTO THE LUNGS 2 TIMES DAILY 10.2 Inhaler 5 03/01/2016 at Unknown time  . SYNTHROID 100 MCG tablet TAKE 2 TABLETS BY MOUTH EVERY MORNING BEFORE BREAKFAST (Patient taking differently: TAKE 200 MCG BY MOUTH EVERY MORNING BEFORE BREAKFAST) 60 tablet 11 03/01/2016 at Unknown time  . torsemide (DEMADEX) 20 MG tablet Take 20-40 mg by mouth as directed. Take 40 mg once daily alternating with 20 mg the next day continuously  6 03/01/2016 at Unknown time  . warfarin (COUMADIN) 5 MG tablet TAKE 1 TABLET (5 MG TOTAL) BY MOUTH AS DIRECTED. (Patient taking differently: Take 2.5 mg by mouth monday, wednesday, friday and 5 mg on all other days) 30 tablet 0 03/01/2016 at 2100  . HYDROcodone-acetaminophen (NORCO/VICODIN) 5-325 MG per tablet Take 1 tablet by mouth every 6 (six) hours as needed for moderate pain. (Patient not taking: Reported on 03/02/2016) 60 tablet 0 Completed Course at Unknown time  . tamsulosin (FLOMAX) 0.4 MG CAPS capsule Take 1 capsule (0.4 mg total) by mouth daily. (Patient not taking: Reported on 03/02/2016) 30 capsule 1 Completed Course at Unknown time  . triamcinolone cream (KENALOG) 0.5 % Apply topically 2 (two) times daily. (Patient not taking: Reported on 03/02/2016) 60 g 2 Not Taking at Unknown time   Scheduled:  . amLODipine  10 mg  Oral Daily  . atorvastatin  20 mg Oral Daily  . carvedilol  6.25 mg Oral BID WC  . cefTRIAXone (ROCEPHIN)  IV  1 g Intravenous Q24H  . escitalopram  5 mg Oral Daily  . ferrous sulfate  325 mg Oral Daily  . hydrALAZINE  100 mg Oral TID  . insulin aspart  0-15 Units Subcutaneous TID WC  . insulin aspart  0-5 Units Subcutaneous QHS  . isosorbide mononitrate  90 mg Oral BID  . levothyroxine  200 mcg Oral QAC breakfast  . mometasone-formoterol  2 puff Inhalation BID  . potassium chloride SA  30 mEq Oral TID  . sodium chloride flush  3 mL Intravenous Q12H  . Warfarin - Pharmacist Dosing Inpatient   Does not apply q1800   Infusions:  . dextrose 5 % and 0.45% NaCl 50 mL (03/03/16 1806)    Assessment: 74 yoM s/p fall found to  Have hypernatremia.  On chronic warfarin for A-fib.  HD 2.5 mg M/W/F and  Tues/Thur/Sat/Sun. LD 3/13 @ 2100.  INR = 1.47 on admission. [place]=3.8  Warfarin per Rx.  Goal of Therapy:  INR 2-3   03/04/2016  INR is 1.98, slightly subtherapeutic  Hgb, plt low but stable  PT/INR ordered with AM labs  Pt with CT scan today to check for subdural hemorrhage. Per CT report, no acute abnormalities found.     Plan:   Warfarin 2.5 mg x1   Follow PT/INR, Hgb trends  Adalberto Cole, PharmD, BCPS Pager [phone] 03/04/2016 1:13 PM

## 2016-03-04 NOTE — Progress Notes (Signed)
Rehab Admissions Coordinator Note:  Patient was screened by Clois Dupes for appropriateness for an Inpatient Acute Rehab Consult per PT recommendation.   At this time, we are recommending Skilled Nursing Facility. Pt has rehab needs, but lacks the medical neccessity for an inpt hospital rehab admission.  Clois Dupes 03/04/2016, 5:11 PM  I can be reached at [phone].

## 2016-03-05 LAB — PROTIME-INR
INR: 2.19 — AB (ref 0.00–1.49)
Prothrombin Time: 24.1 seconds — ABNORMAL HIGH (ref 11.6–15.2)

## 2016-03-05 LAB — BASIC METABOLIC PANEL
ANION GAP: 9 (ref 5–15)
BUN: 31 mg/dL — AB (ref 6–20)
CHLORIDE: 114 mmol/L — AB (ref 101–111)
CO2: 22 mmol/L (ref 22–32)
Calcium: 8.6 mg/dL — ABNORMAL LOW (ref 8.9–10.3)
Creatinine, Ser: 2.46 mg/dL — ABNORMAL HIGH (ref 0.61–1.24)
GFR, EST AFRICAN AMERICAN: 28 mL/min — AB (ref >60)
GFR, EST NON AFRICAN AMERICAN: 24 mL/min — AB (ref >60)
Glucose, Bld: 123 mg/dL — ABNORMAL HIGH (ref 65–99)
POTASSIUM: 4.2 mmol/L (ref 3.5–5.1)
SODIUM: 145 mmol/L (ref 135–145)

## 2016-03-05 LAB — GLUCOSE, CAPILLARY
GLUCOSE-CAPILLARY: 103 mg/dL — AB (ref 65–99)
GLUCOSE-CAPILLARY: 112 mg/dL — AB (ref 65–99)
GLUCOSE-CAPILLARY: 111 mg/dL — AB (ref 65–99)
GLUCOSE-CAPILLARY: 144 mg/dL — AB (ref 65–99)

## 2016-03-05 LAB — MAGNESIUM: Magnesium: 2 mg/dL (ref 1.7–2.4)

## 2016-03-05 MED ORDER — HYDRALAZINE HCL 25 MG PO TABS
25.0000 mg | ORAL_TABLET | Freq: Three times a day (TID) | ORAL | Status: DC
  Administered 2016-03-05 – 2016-03-07 (×7): 25 mg via ORAL
  Filled 2016-03-05 (×7): qty 1

## 2016-03-05 MED ORDER — ISOSORBIDE MONONITRATE ER 30 MG PO TB24
30.0000 mg | ORAL_TABLET | Freq: Two times a day (BID) | ORAL | Status: DC
  Administered 2016-03-05 – 2016-03-07 (×4): 30 mg via ORAL
  Filled 2016-03-05 (×4): qty 1

## 2016-03-05 MED ORDER — WARFARIN SODIUM 5 MG PO TABS
5.0000 mg | ORAL_TABLET | Freq: Once | ORAL | Status: AC
  Administered 2016-03-05: 5 mg via ORAL
  Filled 2016-03-05: qty 1

## 2016-03-05 NOTE — Clinical Social Work Placement (Addendum)
   CLINICAL SOCIAL WORK PLACEMENT  NOTE  Date:  03/05/2016  Patient Details  Name: Frank Mclean MRN: [id number] Date of Birth: 1941/02/09  Clinical Social Work is seeking post-discharge placement for this patient at the Skilled  Nursing Facility level of care (*CSW will initial, date and re-position this form in  chart as items are completed):  Yes   Patient/family provided with [facility] Clinical Social Work Department's list of facilities offering this level of care within the geographic area requested by the patient (or if unable, by the patient's family).  Yes   Patient/family informed of their freedom to choose among providers that offer the needed level of care, that participate in Medicare, Medicaid or managed care program needed by the patient, have an available bed and are willing to accept the patient.      Patient/family informed of [facility]'s ownership interest in [facility] and [facility], as well as of the fact that they are under no obligation to receive care at these facilities.  PASRR submitted to EDS on       PASRR number received on       Existing PASRR number confirmed on   03/05/2016  FL2 transmitted to all facilities in geographic area requested by pt/family on 03/05/16     FL2 transmitted to all facilities within larger geographic area on       Patient informed that his/her managed care company has contracts with or will negotiate with certain facilities, including the following:        Yes   Patient/family informed of bed offers received.  Patient chooses bed at [facility]     Physician recommends and patient chooses bed at [facility]    Patient to be transferred to   on  .  Patient to be transferred to facility by EMS     Patient family notified on   of transfer.  Name of family member notified:        PHYSICIAN Please sign FL2, Please sign DNR     Additional Comment:    _______________________________________________ Raye Sorrow, LCSW 03/05/2016, 11:23 AM

## 2016-03-05 NOTE — Progress Notes (Signed)
Patient Demographics  Frank Mclean, is a 75 y.o. male, DOB - 03-21-1941, ZOX:[id number]  Admit date - 03/02/2016   Admitting Physician Alberteen Sam, MD  Outpatient Primary MD for the patient is [name], MD  LOS - 3   Chief Complaint  Patient presents with  . Fall       Admission HPI/Brief narrative: 75 y.o. male with a past medical history significant for dementia, NICM and systolic and diastolic CHF EF 81% with ICD/pacer, CKD III, NIDDM, hypothyroidism, pAF on warfarin, and hx of stroke who presents with fall and altered mental status, workup was significant for hypernatremia and dehydration. Subjective:   Frank Mclean today has, No headache, No chest pain, No abdominal pain - No Nausea, report generalized weakness, no further diarrhea since admission  Assessment & Plan    Principal Problem:   Hypernatremia Active Problems:   DM (diabetes mellitus), type 2 with renal complications (HCC)   CKD (chronic kidney disease) stage 3, GFR 30-59 ml/min   Memory loss   Anticoagulated on Coumadin   NICM (nonischemic cardiomyopathy) (HCC)   Anemia in chronic renal disease   Chronic depression   PAF (paroxysmal atrial fibrillation) (HCC)   Hypothyroidism   Elevated troponin   Chronic combined systolic and diastolic CHF (congestive heart failure) (HCC)   Hyperbilirubinemia  Full and Generalized weakness  - This is most likely related to hypernatremia ,as well UTI,  will continue with gentle hydration. - Patient reports diarrhea Prior to admission, no bowel movement since he was admitted 3 days ago,C. difficile and GI panel by PCR were pending, will cancel these tests. - CT head with no acute finding  UTI - Continue with IV Rocephin, follow on urine cultures  Hypernatremia:  - This is new. Likely this is the cause of his fall and encephalopathy. Initially on D5W, so transition to D5  half-normal saline with no significant improvement , transitioned back to D5W yesterday, a significant improvement since, will increase right today. .   Chronic systolic and diastolic CHF, with cardiomyopathy EF less than 30%. - Status post AICD placement  - No rales, overt edema on CXR or marked LE edema, but likely fluid overloaded (dry weight reported ~205lbs per family). - Strict I/Os, daily weights - Telemetry showing few beats of nonsustained V. Tach, short period of time of second-degree Mobitz 2 AV block, replete electrolytes, continue with beta blockers, patient already has a ICD. - Blood pressure on the lower side, will continue with Coreg, lower the dose of hydralazine and Imdur, no ARB or ACEI given his renal failure. - Will hold torsemide given worsening renal function and soft blood pressure  Elevated troponin - This is chronic. Likely related to cardiomegaly/cardiomyopathy.  Hyperbilirubinemia:  - Unclear etiology. Significance unclear. - Resolved with hydration   Chronic kidney disease stage IV  Stable, creatinine is trending up gradually, will monitor closely, will hold his torsemide.   Hypokalemia:  Takes 30 mEq TID reportedly at home -Continue (with one dose now) and trend BMP -Goal K > 4  Anemia of renal disease:  At baseline  Hypothyroidism: - Continue synthroid  NIDDM: - Hold glipizide, CBGs are controlled with insulin sliding scale   Delirium and dementia: There appears to be  a gradual progrsesion of his dementia superimposed on his acute hypernatremia and encephalopathy. [name] brother in law discussed with me in private that his sister has resisted coming to terms with the patient's dementia, and may benefit from additional assistance in the home. Furthermore, repeat OT assessment and cognitive testing/MMSE? may be helpful in documenting progression of the patient's dementia.  Atrial fibrillation: - CHADS2Vasc 7. - Continue warfarin and  BB   Code Status: DNR  Family Communication: none at bedside  Disposition Plan: SNF when medically stable in 2-3 days.   Procedures  none   Consults   none   Medications  Scheduled Meds: . atorvastatin  20 mg Oral Daily  . carvedilol  6.25 mg Oral BID WC  . cefTRIAXone (ROCEPHIN)  IV  1 g Intravenous Q24H  . escitalopram  5 mg Oral Daily  . ferrous sulfate  325 mg Oral Daily  . hydrALAZINE  25 mg Oral TID  . insulin aspart  0-15 Units Subcutaneous TID WC  . insulin aspart  0-5 Units Subcutaneous QHS  . isosorbide mononitrate  30 mg Oral BID  . levothyroxine  200 mcg Oral QAC breakfast  . mometasone-formoterol  2 puff Inhalation BID  . potassium chloride SA  30 mEq Oral TID  . sodium chloride flush  3 mL Intravenous Q12H  . torsemide  20 mg Oral Daily  . warfarin  5 mg Oral ONCE-1800  . Warfarin - Pharmacist Dosing Inpatient   Does not apply q1800   Continuous Infusions: . dextrose 50 mL/hr at 03/05/16 1358   PRN Meds:.acetaminophen **OR** acetaminophen  DVT Prophylaxis  warfarin  Lab Results  Component Value Date   PLT 139* 03/04/2016    Antibiotics    Anti-infectives    Start     Dose/Rate Route Frequency Ordered Stop   03/04/16 0800  cefTRIAXone (ROCEPHIN) 1 g in dextrose 5 % 50 mL IVPB     1 g 100 mL/hr over 30 Minutes Intravenous Every 24 hours 03/04/16 0741            Objective:   Filed Vitals:   03/04/16 2117 03/04/16 2212 03/05/16 0520 03/05/16 0903  BP:  107/73 109/70 102/71  Pulse:  84 74 79  Temp:  98 F (36.7 C) 98 F (36.7 C)   TempSrc:  Oral Oral   Resp:  18 18   Height:      Weight:   97.3 kg (214 lb 8.1 oz)   SpO2: 99% 100% 99% 98%    Wt Readings from Last 3 Encounters:  03/05/16 97.3 kg (214 lb 8.1 oz)  11/26/15 95.618 kg (210 lb 12.8 oz)  09/10/15 91.173 kg (201 lb)     Intake/Output Summary (Last 24 hours) at 03/05/16 1403 Last data filed at 03/05/16 0500  Gross per 24 hour  Intake 1204.17 ml  Output    450 ml   Net 754.17 ml     Physical Exam  Awake Alert, communicative Supple Neck,No JVD Symmetrical Chest wall movement, Good air movement bilaterally, CTAB ,No Gallops,Rubs or new Murmurs, No Parasternal Heave +ve B.Sounds, Abd Soft, No tenderness, No organomegaly appriciated, No rebound - guarding or rigidity. No Cyanosis, Clubbing or edema, No new Rash or bruise    Data Review   Micro Results No results found for this or any previous visit (from the past 240 hour(s)).  Radiology Reports Ct Head Wo Contrast  03/04/2016  CLINICAL DATA:  Dementia/ s/p fall. Pt fell on 3-14, unknown LOC,  pt denies any pain, on blood thinners, unknown if hit head, hx dementia EXAM: CT HEAD WITHOUT CONTRAST TECHNIQUE: Contiguous axial images were obtained from the base of the skull through the vertex without intravenous contrast. COMPARISON:  03/25/2015 FINDINGS: There is central and cortical atrophy. Periventricular white matter changes are consistent with small vessel disease. There is a small remote lacunar infarct in the right thalamus. Bone windows demonstrate no focal edema or hematoma of the scalp. No calvarial fracture. There atherosclerotic calcification of the internal carotid arteries. IMPRESSION: 1. Atrophy and small vessel disease. 2. Remote right the laminae infarct. 3. No evidence for acute intracranial abnormality. Electronically Signed   By: Norva Pavlov M.D.   On: 03/04/2016 09:17   Dg Chest Port 1 View  03/02/2016  CLINICAL DATA:  Fall this morning, now with weakness. EXAM: PORTABLE CHEST 1 VIEW COMPARISON:  None. FINDINGS: There is marked cardiomegaly. Left chest wall pacemaker/AICD in place without evidence of wire discontinuity. There is mild central pulmonary vascular congestion without frank alveolar pulmonary edema. Lungs are otherwise clear. Small portion of the right costophrenic angle is excluded on this exam. Osseous structures about the chest are unremarkable. IMPRESSION: 1. Marked  cardiomegaly. Mild central pulmonary vascular congestion, probably chronic, without overt pulmonary edema. 2. Lungs are otherwise clear. No evidence of pneumonia or pleural effusion. This exam was interpreted during a PACS downtime with limited availability of comparison cases. It has been flagged for review following the downtime. If clinically indicated after this review, an addendum will be issued providing details about comparison to prior imaging. Electronically Signed   By: Bary Richard M.D.   On: 03/02/2016 22:23     CBC  Recent Labs Lab 03/02/16 2227 03/03/16 0513 03/04/16 0503  WBC 8.1 6.6 8.1  HGB 11.0* 9.6* 9.0*  HCT 36.0* 31.5* 30.5*  PLT 158 164 139*  MCV 81.6 79.7 83.8  MCH 24.9* 24.3* 24.7*  MCHC 30.6 30.5 29.5*  RDW 18.4* 18.2* 18.6*  LYMPHSABS 0.2*  --   --   MONOABS 0.3  --   --   EOSABS 0.0  --   --   BASOSABS 0.0  --   --     Chemistries   Recent Labs Lab 03/02/16 2227 03/03/16 0513 03/03/16 1341 03/04/16 0503 03/05/16 0429  NA 155* 152* 145 145 145  K 3.0* 3.1* 3.4* 3.9 4.2  CL 119* 118* 116* 113* 114*  CO2 24 25 23 24 22   GLUCOSE 127* 148* 134* 131* 123*  BUN 28* 30* 27* 26* 31*  CREATININE 2.10* 2.13* 2.30* 2.32* 2.46*  CALCIUM 9.4 8.9 8.8* 8.4* 8.6*  MG  --  1.9  --   --  2.0  AST 24 17  --   --   --   ALT 28 23  --   --   --   ALKPHOS 124 100  --   --   --   BILITOT 1.7* 0.9  --   --   --    ------------------------------------------------------------------------------------------------------------------ estimated creatinine clearance is 30.1 mL/min (by C-G formula based on Cr of 2.46). ------------------------------------------------------------------------------------------------------------------  Recent Labs  03/03/16 0513  HGBA1C 5.7*   ------------------------------------------------------------------------------------------------------------------ No results for input(s): CHOL, HDL, LDLCALC, TRIG, CHOLHDL, LDLDIRECT in the last  72 hours. ------------------------------------------------------------------------------------------------------------------  Recent Labs  03/03/16 0513  TSH 0.018*   ------------------------------------------------------------------------------------------------------------------ No results for input(s): VITAMINB12, FOLATE, FERRITIN, TIBC, IRON, RETICCTPCT in the last 72 hours.  Coagulation profile  Recent Labs Lab 03/02/16 2227 03/03/16 1341  03/04/16 0503 03/05/16 0429  INR 1.47 1.65* 1.98* 2.19*    No results for input(s): DDIMER in the last 72 hours.  Cardiac Enzymes  Recent Labs Lab 03/02/16 2227 03/03/16 0513 03/04/16 0503  TROPONINI 0.24* 0.44* 0.21*   ------------------------------------------------------------------------------------------------------------------ Invalid input(s): POCBNP     Time Spent in minutes   30 minutes   Laveta Gilkey M.D on 03/05/2016 at 2:03 PM  Between 7am to 7pm - Pager - [phone]  After 7pm go to [email or web address] - password [facility]  [facility]   Office  [phone]

## 2016-03-05 NOTE — Clinical Social Work Note (Signed)
Clinical Social Work Assessment  Patient Details  Name: Frank Mclean MRN: [id number] Date of Birth: 1941-04-23  Date of referral:  03/05/16               Reason for consult:  Facility Placement                Permission sought to share information with:  Case Manager, Magazine features editor, Family Supports Permission granted to share information::     Name::        Agency::  [facility] (SNF placement)  Relationship::  Wife: [name] Information:     Housing/Transportation Living arrangements for the past 2 months:  Skilled Building surveyor of Information:  Medical Team, Patient, Spouse Patient Interpreter Needed:  None Criminal Activity/Legal Involvement Pertinent to Current Situation/Hospitalization:  No - Comment as needed Significant Relationships:  Other Family Members, Spouse Lives with:  Spouse Do you feel safe going back to the place where you live?  No (needing rehab) Need for family participation in patient care:  Yes (Comment) (memory problems, wife helps with decisions.)  Care giving concerns:  LCSW spoke with wife with no concerns since patient is going to SNF.  Patient and wife agreeable for ST SNF. Here hopeful for CIR however due to current level of care and needs, CIR may be to aggressive for patient.  Wife understanding. [facility]   Social Worker assessment / plan: LCSW completed assessment with wife due to memory issues with patient. Patient and wife planning for SNF with bed choice: [facility]. Wife will sign patient into facility when medically stable for DC. Patient will transport by EMS per wife. Wife is a retired Higher education careers adviser and is involved in care, but continues to work. She is available this weekend or Monday depending on plan of DC.   Employment status:  Retired Health and safety inspector:  Armed forces operational officer, Teacher, English as a foreign language PT Recommendations:  Skilled Nursing Facility Information / Referral to community  resources:  Skilled Nursing Facility  Patient/Family's Response to care:  Agreeable to plan  Patient/Family's Understanding of and Emotional Response to Diagnosis, Current Treatment, and Prognosis:  Wife and husband very understanding and aware of prognosis and treatment plan.  Adaptable to plans of SNF and current treatment. No concerns and emotional response remains hopeful for quick rehab so he can return home.    Emotional Assessment Appearance:  Appears stated age Attitude/Demeanor/Rapport:  Other (cooperative and pleasant) Affect (typically observed):  Accepting, Adaptable, Pleasant Orientation:  Oriented to Self, Oriented to Place Alcohol / Substance use:  Not Applicable Psych involvement (Current and /or in the community):  No (Comment)  Discharge Needs  Concerns to be addressed:  No discharge needs identified Readmission within the last 30 days:  No Current discharge risk:  None Barriers to Discharge:  No Barriers Identified, Continued Medical Work up   Raye Sorrow, LCSW 03/05/2016, 11:29 AM

## 2016-03-05 NOTE — Progress Notes (Signed)
ANTICOAGULATION CONSULT NOTE - Follow-Up  Pharmacy Consult for Warfarin Indication: atrial fibrillation  Allergies  Allergen Reactions  . Levothyroxine Sodium Other (See Comments)    MUST TAKE BRAND NAME GENERIC DOESN'T WORK FOR PATIENT    Patient Measurements: Height: 6' 2.5" (189.2 cm) Weight: 214 lb 8.1 oz (97.3 kg) IBW/kg (Calculated) : 83.35   Vital Signs: Temp: 98 F (36.7 C) (03/17 0520) Temp Source: Oral (03/17 0520) BP: 102/71 mmHg (03/17 0903) Pulse Rate: 79 (03/17 0903)  Labs:  Recent Labs  03/02/16 2227 03/03/16 0513 03/03/16 1341 03/04/16 0503 03/05/16 0429  HGB 11.0* 9.6*  --  9.0*  --   HCT 36.0* 31.5*  --  30.5*  --   PLT 158 164  --  139*  --   LABPROT 17.9*  --  19.5* 22.4* 24.1*  INR 1.47  --  1.65* 1.98* 2.19*  CREATININE 2.10* 2.13* 2.30* 2.32* 2.46*  TROPONINI 0.24* 0.44*  --  0.21*  --     Estimated Creatinine Clearance: 30.1 mL/min (by C-G formula based on Cr of 2.46).  Assessment: 1 yoM s/p fall, found to have hypernatremia.  On chronic warfarin for A-fib. Home dose: 2.5mg  M/W/F and 5mg  Tues/Thur/Sat/Sun. INR 1.47 on admission. Pharmacy was asked to dose warfarin. CT was negative for hemorrhage.   03/05/2016:  INR is therapeutic today after 10mg  and 2.5mg .   CBC ok as of 3/16. No bleeding reported/documented.  Diet: Regular, no intake charted.  Drug interactions: none  Goal of Therapy:  INR 2-3    Plan:  Give Warfarin 5 mg x1 tonight  Check PT/INR daily.  Charolotte Eke, PharmD, pager [phone]. 03/05/2016,10:49 AM.

## 2016-03-05 NOTE — Progress Notes (Signed)
MD paged d/t patient having 7 beat run of SVT. Patient asymptomatic. BP 99/68, HR 84 and rate currently back in paced A-Flutter. Awaiting response.

## 2016-03-05 NOTE — Consult Note (Signed)
   [facility] Inpatient Consult   03/05/2016  RIYON MCILROY [birth year] [phone]   Patient screened for [facility] Care Management services. Reviewed in EPIC notes that patient's disposition is appears to be for SNF. Therefore, will not engage for [facility] Care Management services at this time. If disposition plans change to home, please make [facility] Care Management consult. For further questions, please contact:  Raiford Noble, MSN-Ed, RN,BSN [facility] Liaison [phone]

## 2016-03-06 LAB — CBC
HCT: 31.9 % — ABNORMAL LOW (ref 39.0–52.0)
Hemoglobin: 9.7 g/dL — ABNORMAL LOW (ref 13.0–17.0)
MCH: 24.8 pg — AB (ref 26.0–34.0)
MCHC: 30.4 g/dL (ref 30.0–36.0)
MCV: 81.6 fL (ref 78.0–100.0)
PLATELETS: 146 10*3/uL — AB (ref 150–400)
RBC: 3.91 MIL/uL — ABNORMAL LOW (ref 4.22–5.81)
RDW: 18 % — ABNORMAL HIGH (ref 11.5–15.5)
WBC: 7.8 10*3/uL (ref 4.0–10.5)

## 2016-03-06 LAB — BASIC METABOLIC PANEL
Anion gap: 12 (ref 5–15)
BUN: 29 mg/dL — ABNORMAL HIGH (ref 6–20)
CALCIUM: 8.8 mg/dL — AB (ref 8.9–10.3)
CO2: 21 mmol/L — AB (ref 22–32)
CREATININE: 2.26 mg/dL — AB (ref 0.61–1.24)
Chloride: 107 mmol/L (ref 101–111)
GFR calc Af Amer: 31 mL/min — ABNORMAL LOW (ref >60)
GFR calc non Af Amer: 26 mL/min — ABNORMAL LOW (ref >60)
GLUCOSE: 128 mg/dL — AB (ref 65–99)
Potassium: 4.9 mmol/L (ref 3.5–5.1)
Sodium: 140 mmol/L (ref 135–145)

## 2016-03-06 LAB — GLUCOSE, CAPILLARY
GLUCOSE-CAPILLARY: 133 mg/dL — AB (ref 65–99)
GLUCOSE-CAPILLARY: 105 mg/dL — AB (ref 65–99)
Glucose-Capillary: 126 mg/dL — ABNORMAL HIGH (ref 65–99)
Glucose-Capillary: 104 mg/dL — ABNORMAL HIGH (ref 65–99)

## 2016-03-06 LAB — PROTIME-INR
INR: 1.64 — ABNORMAL HIGH (ref 0.00–1.49)
PROTHROMBIN TIME: 19.4 s — AB (ref 11.6–15.2)

## 2016-03-06 LAB — CBC AND DIFFERENTIAL: WBC: 7.8 10^3/mL

## 2016-03-06 MED ORDER — WARFARIN SODIUM 5 MG PO TABS
5.0000 mg | ORAL_TABLET | Freq: Once | ORAL | Status: AC
  Administered 2016-03-06: 5 mg via ORAL
  Filled 2016-03-06: qty 1

## 2016-03-06 NOTE — Progress Notes (Signed)
Patient Demographics  Frank Mclean, is a 75 y.o. male, DOB - 03-27-1941, RUE:[id number]  Admit date - 03/02/2016   Admitting Physician Alberteen Sam, MD  Outpatient Primary MD for the patient is [name], MD  LOS - 4   Chief Complaint  Patient presents with  . Fall       Admission HPI/Brief narrative: 75 y.o. male with a past medical history significant for dementia, NICM and systolic and diastolic CHF EF 14% with ICD/pacer, CKD III, NIDDM, hypothyroidism, pAF on warfarin, and hx of stroke who presents with fall and altered mental status, workup was significant for hypernatremia and dehydration. Subjective:   Frank Mclean today has, No headache, No chest pain, No abdominal pain - No Nausea, report generalized weakness, no further diarrhea since admission  Assessment & Plan    Principal Problem:   Hypernatremia Active Problems:   DM (diabetes mellitus), type 2 with renal complications (HCC)   CKD (chronic kidney disease) stage 3, GFR 30-59 ml/min   Memory loss   Anticoagulated on Coumadin   NICM (nonischemic cardiomyopathy) (HCC)   Anemia in chronic renal disease   Chronic depression   PAF (paroxysmal atrial fibrillation) (HCC)   Hypothyroidism   Elevated troponin   Chronic combined systolic and diastolic CHF (congestive heart failure) (HCC)   Hyperbilirubinemia  Full and Generalized weakness  - This is most likely related to hypernatremia ,as well UTI,  will continue with gentle hydration. - Patient reports diarrhea Prior to admission, no bowel movement since he was admitted 3 days ago,C. difficile and GI panel by PCR were pending, will cancel these tests. - CT head with no acute finding  UTI - Continue with IV Rocephin, follow on urine cultures  Hypernatremia:  - This is new. Likely this is the cause of his fall and encephalopathy. - Resolved with D5W, will discontinue  fluids today and monitor sodium level of D5W.   Chronic systolic and diastolic CHF, with cardiomyopathy EF less than 30%. - Status post AICD placement  - No rales, overt edema on CXR or marked LE edema, but likely fluid overloaded (dry weight reported ~205lbs per family). - Strict I/Os, daily weights - Telemetry showing few beats of nonsustained V. Tach, short period of time of second-degree Mobitz 2 AV block, replete electrolytes, continue with beta blockers, patient already has a ICD. - Blood pressure on the lower side, will continue with Coreg, lower the dose of hydralazine and Imdur, no ARB or ACEI given his renal failure. - Will continue to hold torsemide given worsening renal function and soft blood pressure  Elevated troponin - This is chronic. Likely related to cardiomegaly/cardiomyopathy.  Hyperbilirubinemia:  - Unclear etiology. Significance unclear. - Resolved with hydration   Chronic kidney disease stage IV  Stable, creatinine is trending up slowly, currently stable after holding torsemide.   Hypokalemia:  - Takes 30 mEq TID reportedly at home - Potassium was high normal today, will discontinue supplements  Anemia of renal disease:  At baseline  Hypothyroidism: - Continue synthroid  NIDDM: - Hold glipizide, with few episodes of hypoglycemia on admission, CBG has been controlled off insulin, will hold D5W today, and observe CBG.   Delirium and dementia: There appears to be a gradual progrsesion  of his dementia superimposed on his acute hypernatremia and encephalopathy. [name] brother in law discussed with me in private that his sister has resisted coming to terms with the patient's dementia, and may benefit from additional assistance in the home. Furthermore, repeat OT assessment and cognitive testing/MMSE? may be helpful in documenting progression of the patient's dementia.  Atrial fibrillation: - CHADS2Vasc 7. - Continue warfarin and BB   Code  Status: DNR  Family Communication: none at bedside  Disposition Plan: SNF in 1-2 days if no further hypoglycemia, sodium and renal function remained stable of D5W.   Procedures  none   Consults   none   Medications  Scheduled Meds: . atorvastatin  20 mg Oral Daily  . carvedilol  6.25 mg Oral BID WC  . cefTRIAXone (ROCEPHIN)  IV  1 g Intravenous Q24H  . escitalopram  5 mg Oral Daily  . ferrous sulfate  325 mg Oral Daily  . hydrALAZINE  25 mg Oral TID  . insulin aspart  0-15 Units Subcutaneous TID WC  . insulin aspart  0-5 Units Subcutaneous QHS  . isosorbide mononitrate  30 mg Oral BID  . levothyroxine  200 mcg Oral QAC breakfast  . mometasone-formoterol  2 puff Inhalation BID  . potassium chloride SA  30 mEq Oral TID  . sodium chloride flush  3 mL Intravenous Q12H  . warfarin  5 mg Oral ONCE-1800  . Warfarin - Pharmacist Dosing Inpatient   Does not apply q1800   Continuous Infusions:   PRN Meds:.acetaminophen **OR** acetaminophen  DVT Prophylaxis  warfarin  Lab Results  Component Value Date   PLT 146* 03/06/2016    Antibiotics    Anti-infectives    Start     Dose/Rate Route Frequency Ordered Stop   03/04/16 0800  cefTRIAXone (ROCEPHIN) 1 g in dextrose 5 % 50 mL IVPB     1 g 100 mL/hr over 30 Minutes Intravenous Every 24 hours 03/04/16 0741            Objective:   Filed Vitals:   03/05/16 2103 03/05/16 2209 03/06/16 0644 03/06/16 0858  BP:  119/82 119/81   Pulse:  84 72   Temp:  98.9 F (37.2 C) 98.2 F (36.8 C)   TempSrc:  Oral Oral   Resp:  20 18   Height:      Weight:   99.6 kg (219 lb 9.3 oz)   SpO2: 99% 96% 100% 100%    Wt Readings from Last 3 Encounters:  03/06/16 99.6 kg (219 lb 9.3 oz)  11/26/15 95.618 kg (210 lb 12.8 oz)  09/10/15 91.173 kg (201 lb)     Intake/Output Summary (Last 24 hours) at 03/06/16 1255 Last data filed at 03/06/16 0647  Gross per 24 hour  Intake      0 ml  Output   1050 ml  Net  -1050 ml      Physical Exam  Awake Alert, communicative Supple Neck,No JVD Symmetrical Chest wall movement, Good air movement bilaterally, CTAB ,No Gallops,Rubs or new Murmurs, No Parasternal Heave +ve B.Sounds, Abd Soft, No tenderness, No organomegaly appriciated, No rebound - guarding or rigidity. No Cyanosis, Clubbing or edema, No new Rash or bruise    Data Review   Micro Results Recent Results (from the past 240 hour(s))  Urine culture     Status: None (Preliminary result)   Collection Time: 03/04/16  8:49 AM  Result Value Ref Range Status   Specimen Description URINE, CLEAN CATCH  Final   Special Requests NONE  Final   Culture   Final    CULTURE REINCUBATED FOR BETTER GROWTH Performed at [facility]    Report Status PENDING  Incomplete    Radiology Reports Ct Head Wo Contrast  03/04/2016  CLINICAL DATA:  Dementia/ s/p fall. Pt fell on 3-14, unknown LOC, pt denies any pain, on blood thinners, unknown if hit head, hx dementia EXAM: CT HEAD WITHOUT CONTRAST TECHNIQUE: Contiguous axial images were obtained from the base of the skull through the vertex without intravenous contrast. COMPARISON:  03/25/2015 FINDINGS: There is central and cortical atrophy. Periventricular white matter changes are consistent with small vessel disease. There is a small remote lacunar infarct in the right thalamus. Bone windows demonstrate no focal edema or hematoma of the scalp. No calvarial fracture. There atherosclerotic calcification of the internal carotid arteries. IMPRESSION: 1. Atrophy and small vessel disease. 2. Remote right the laminae infarct. 3. No evidence for acute intracranial abnormality. Electronically Signed   By: Norva Pavlov M.D.   On: 03/04/2016 09:17   Dg Chest Port 1 View  03/02/2016  CLINICAL DATA:  Fall this morning, now with weakness. EXAM: PORTABLE CHEST 1 VIEW COMPARISON:  None. FINDINGS: There is marked cardiomegaly. Left chest wall pacemaker/AICD in place without evidence  of wire discontinuity. There is mild central pulmonary vascular congestion without frank alveolar pulmonary edema. Lungs are otherwise clear. Small portion of the right costophrenic angle is excluded on this exam. Osseous structures about the chest are unremarkable. IMPRESSION: 1. Marked cardiomegaly. Mild central pulmonary vascular congestion, probably chronic, without overt pulmonary edema. 2. Lungs are otherwise clear. No evidence of pneumonia or pleural effusion. This exam was interpreted during a PACS downtime with limited availability of comparison cases. It has been flagged for review following the downtime. If clinically indicated after this review, an addendum will be issued providing details about comparison to prior imaging. Electronically Signed   By: Bary Richard M.D.   On: 03/02/2016 22:23     CBC  Recent Labs Lab 03/02/16 2227 03/03/16 0513 03/04/16 0503 03/06/16 0538  WBC 8.1 6.6 8.1 7.8  HGB 11.0* 9.6* 9.0* 9.7*  HCT 36.0* 31.5* 30.5* 31.9*  PLT 158 164 139* 146*  MCV 81.6 79.7 83.8 81.6  MCH 24.9* 24.3* 24.7* 24.8*  MCHC 30.6 30.5 29.5* 30.4  RDW 18.4* 18.2* 18.6* 18.0*  LYMPHSABS 0.2*  --   --   --   MONOABS 0.3  --   --   --   EOSABS 0.0  --   --   --   BASOSABS 0.0  --   --   --     Chemistries   Recent Labs Lab 03/02/16 2227 03/03/16 0513 03/03/16 1341 03/04/16 0503 03/05/16 0429 03/06/16 0538  NA 155* 152* 145 145 145 140  K 3.0* 3.1* 3.4* 3.9 4.2 4.9  CL 119* 118* 116* 113* 114* 107  CO2 24 25 23 24 22  21*  GLUCOSE 127* 148* 134* 131* 123* 128*  BUN 28* 30* 27* 26* 31* 29*  CREATININE 2.10* 2.13* 2.30* 2.32* 2.46* 2.26*  CALCIUM 9.4 8.9 8.8* 8.4* 8.6* 8.8*  MG  --  1.9  --   --  2.0  --   AST 24 17  --   --   --   --   ALT 28 23  --   --   --   --   ALKPHOS 124 100  --   --   --   --  BILITOT 1.7* 0.9  --   --   --   --     ------------------------------------------------------------------------------------------------------------------ estimated creatinine clearance is 32.8 mL/min (by C-G formula based on Cr of 2.26). ------------------------------------------------------------------------------------------------------------------ No results for input(s): HGBA1C in the last 72 hours. ------------------------------------------------------------------------------------------------------------------ No results for input(s): CHOL, HDL, LDLCALC, TRIG, CHOLHDL, LDLDIRECT in the last 72 hours. ------------------------------------------------------------------------------------------------------------------ No results for input(s): TSH, T4TOTAL, T3FREE, THYROIDAB in the last 72 hours.  Invalid input(s): FREET3 ------------------------------------------------------------------------------------------------------------------ No results for input(s): VITAMINB12, FOLATE, FERRITIN, TIBC, IRON, RETICCTPCT in the last 72 hours.  Coagulation profile  Recent Labs Lab 03/02/16 2227 03/03/16 1341 03/04/16 0503 03/05/16 0429 03/06/16 0538  INR 1.47 1.65* 1.98* 2.19* 1.64*    No results for input(s): DDIMER in the last 72 hours.  Cardiac Enzymes  Recent Labs Lab 03/02/16 2227 03/03/16 0513 03/04/16 0503  TROPONINI 0.24* 0.44* 0.21*   ------------------------------------------------------------------------------------------------------------------ Invalid input(s): POCBNP     Time Spent in minutes   30 minutes   Keith Felten M.D on 03/06/2016 at 12:55 PM  Between 7am to 7pm - Pager - [phone]  After 7pm go to [email or web address] - password [facility]  [facility]   Office  [phone]

## 2016-03-06 NOTE — Progress Notes (Signed)
ANTICOAGULATION CONSULT NOTE - Follow-Up  Pharmacy Consult for Warfarin Indication: atrial fibrillation  Allergies  Allergen Reactions  . Levothyroxine Sodium Other (See Comments)    MUST TAKE BRAND NAME GENERIC DOESN'T WORK FOR PATIENT    Patient Measurements: Height: 6' 2.5" (189.2 cm) Weight: 219 lb 9.3 oz (99.6 kg) IBW/kg (Calculated) : 83.35   Vital Signs: Temp: 98.2 F (36.8 C) (03/18 0644) Temp Source: Oral (03/18 0644) BP: 119/81 mmHg (03/18 0644) Pulse Rate: 72 (03/18 0644)  Labs:  Recent Labs  03/04/16 0503 03/05/16 0429 03/06/16 0538  HGB 9.0*  --  9.7*  HCT 30.5*  --  31.9*  PLT 139*  --  146*  LABPROT 22.4* 24.1* 19.4*  INR 1.98* 2.19* 1.64*  CREATININE 2.32* 2.46* 2.26*  TROPONINI 0.21*  --   --     Estimated Creatinine Clearance: 32.8 mL/min (by C-G formula based on Cr of 2.26).   PTA warfarin dose:  2.5mg  M/W/F and 5mg  Tues/Thur/Sat/Sun  Assessment: 7 yoM s/p fall, found to have hypernatremia.  On chronic warfarin for A-fib. INR 1.47 on admission. Pharmacy was asked to dose warfarin. CT negative for hemorrhage.   03/05/2016:  INR is sub-therapeutic today on home regimen  CBC stable. No bleeding reported/documented.  Diet: Regular, no intake charted.  Drug interactions: none  Goal of Therapy:  INR 2-3    Plan:  Give Warfarin 5 mg x1 tonight  Check PT/INR daily.  Junita Push, PharmD, BCPS Pager: [phone] 03/06/2016,10:41 AM.

## 2016-03-07 DIAGNOSIS — I5022 Chronic systolic (congestive) heart failure: Secondary | ICD-10-CM | POA: Diagnosis not present

## 2016-03-07 DIAGNOSIS — N189 Chronic kidney disease, unspecified: Secondary | ICD-10-CM | POA: Diagnosis not present

## 2016-03-07 DIAGNOSIS — F329 Major depressive disorder, single episode, unspecified: Secondary | ICD-10-CM | POA: Diagnosis not present

## 2016-03-07 DIAGNOSIS — E875 Hyperkalemia: Secondary | ICD-10-CM | POA: Diagnosis not present

## 2016-03-07 DIAGNOSIS — E039 Hypothyroidism, unspecified: Secondary | ICD-10-CM | POA: Diagnosis not present

## 2016-03-07 DIAGNOSIS — N400 Enlarged prostate without lower urinary tract symptoms: Secondary | ICD-10-CM | POA: Diagnosis not present

## 2016-03-07 DIAGNOSIS — I420 Dilated cardiomyopathy: Secondary | ICD-10-CM | POA: Diagnosis not present

## 2016-03-07 DIAGNOSIS — E785 Hyperlipidemia, unspecified: Secondary | ICD-10-CM | POA: Diagnosis not present

## 2016-03-07 DIAGNOSIS — E876 Hypokalemia: Secondary | ICD-10-CM | POA: Diagnosis not present

## 2016-03-07 DIAGNOSIS — N184 Chronic kidney disease, stage 4 (severe): Secondary | ICD-10-CM | POA: Diagnosis not present

## 2016-03-07 DIAGNOSIS — E870 Hyperosmolality and hypernatremia: Secondary | ICD-10-CM | POA: Diagnosis not present

## 2016-03-07 DIAGNOSIS — E1129 Type 2 diabetes mellitus with other diabetic kidney complication: Secondary | ICD-10-CM | POA: Diagnosis not present

## 2016-03-07 DIAGNOSIS — Z7901 Long term (current) use of anticoagulants: Secondary | ICD-10-CM | POA: Diagnosis not present

## 2016-03-07 DIAGNOSIS — N390 Urinary tract infection, site not specified: Secondary | ICD-10-CM

## 2016-03-07 DIAGNOSIS — N183 Chronic kidney disease, stage 3 (moderate): Secondary | ICD-10-CM | POA: Diagnosis not present

## 2016-03-07 DIAGNOSIS — M79642 Pain in left hand: Secondary | ICD-10-CM | POA: Diagnosis not present

## 2016-03-07 DIAGNOSIS — Z5181 Encounter for therapeutic drug level monitoring: Secondary | ICD-10-CM | POA: Diagnosis not present

## 2016-03-07 DIAGNOSIS — R5381 Other malaise: Secondary | ICD-10-CM | POA: Diagnosis not present

## 2016-03-07 DIAGNOSIS — R2681 Unsteadiness on feet: Secondary | ICD-10-CM | POA: Diagnosis not present

## 2016-03-07 DIAGNOSIS — D649 Anemia, unspecified: Secondary | ICD-10-CM | POA: Diagnosis not present

## 2016-03-07 DIAGNOSIS — I5042 Chronic combined systolic (congestive) and diastolic (congestive) heart failure: Secondary | ICD-10-CM | POA: Diagnosis not present

## 2016-03-07 DIAGNOSIS — M6281 Muscle weakness (generalized): Secondary | ICD-10-CM | POA: Diagnosis not present

## 2016-03-07 DIAGNOSIS — R531 Weakness: Secondary | ICD-10-CM | POA: Diagnosis not present

## 2016-03-07 DIAGNOSIS — D638 Anemia in other chronic diseases classified elsewhere: Secondary | ICD-10-CM | POA: Diagnosis not present

## 2016-03-07 DIAGNOSIS — Z79899 Other long term (current) drug therapy: Secondary | ICD-10-CM | POA: Diagnosis not present

## 2016-03-07 DIAGNOSIS — Z5189 Encounter for other specified aftercare: Secondary | ICD-10-CM | POA: Diagnosis not present

## 2016-03-07 DIAGNOSIS — Z9181 History of falling: Secondary | ICD-10-CM | POA: Diagnosis not present

## 2016-03-07 DIAGNOSIS — I4891 Unspecified atrial fibrillation: Secondary | ICD-10-CM | POA: Diagnosis not present

## 2016-03-07 DIAGNOSIS — N3000 Acute cystitis without hematuria: Secondary | ICD-10-CM | POA: Diagnosis not present

## 2016-03-07 DIAGNOSIS — M25539 Pain in unspecified wrist: Secondary | ICD-10-CM | POA: Diagnosis not present

## 2016-03-07 DIAGNOSIS — E784 Other hyperlipidemia: Secondary | ICD-10-CM | POA: Diagnosis not present

## 2016-03-07 DIAGNOSIS — I480 Paroxysmal atrial fibrillation: Secondary | ICD-10-CM | POA: Diagnosis not present

## 2016-03-07 LAB — BASIC METABOLIC PANEL
Anion gap: 10 (ref 5–15)
BUN: 34 mg/dL — AB (ref 4–21)
BUN: 34 mg/dL — AB (ref 6–20)
CHLORIDE: 110 mmol/L (ref 101–111)
CO2: 20 mmol/L — ABNORMAL LOW (ref 22–32)
CREATININE: 2.3 mg/dL — AB (ref 0.6–1.3)
Calcium: 9.1 mg/dL (ref 8.9–10.3)
Creatinine, Ser: 2.32 mg/dL — ABNORMAL HIGH (ref 0.61–1.24)
GFR calc Af Amer: 30 mL/min — ABNORMAL LOW (ref >60)
GFR calc non Af Amer: 26 mL/min — ABNORMAL LOW (ref >60)
Glucose, Bld: 108 mg/dL — ABNORMAL HIGH (ref 65–99)
Glucose: 108 mg/dL
POTASSIUM: 5.2 mmol/L — AB (ref 3.5–5.1)
SODIUM: 140 mmol/L (ref 137–147)
SODIUM: 140 mmol/L (ref 135–145)

## 2016-03-07 LAB — GLUCOSE, CAPILLARY
GLUCOSE-CAPILLARY: 101 mg/dL — AB (ref 65–99)
GLUCOSE-CAPILLARY: 111 mg/dL — AB (ref 65–99)

## 2016-03-07 LAB — PROTIME-INR
INR: 1.62 — ABNORMAL HIGH (ref 0.00–1.49)
Prothrombin Time: 19.2 seconds — ABNORMAL HIGH (ref 11.6–15.2)

## 2016-03-07 LAB — URINE CULTURE: Culture: 60000

## 2016-03-07 MED ORDER — HYDRALAZINE HCL 25 MG PO TABS: 25.0000 mg | ORAL_TABLET | Freq: Three times a day (TID) | ORAL | Status: DC

## 2016-03-07 MED ORDER — AMOXICILLIN-POT CLAVULANATE 500-125 MG PO TABS: 1.0000 | ORAL_TABLET | Freq: Two times a day (BID) | ORAL | Status: DC

## 2016-03-07 MED ORDER — TORSEMIDE 20 MG PO TABS: 20.0000 mg | ORAL_TABLET | Freq: Every day | ORAL | Status: DC

## 2016-03-07 MED ORDER — HYDROCODONE-ACETAMINOPHEN 5-325 MG PO TABS: 1.0000 | ORAL_TABLET | Freq: Three times a day (TID) | ORAL | Status: DC | PRN

## 2016-03-07 MED ORDER — ACETAMINOPHEN 325 MG PO TABS: 325.0000 mg | ORAL_TABLET | Freq: Four times a day (QID) | ORAL | Status: AC | PRN

## 2016-03-07 MED ORDER — LEVOFLOXACIN 750 MG PO TABS: 750.0000 mg | ORAL_TABLET | ORAL | Status: DC

## 2016-03-07 MED ORDER — AMOXICILLIN-POT CLAVULANATE 500-125 MG PO TABS
1.0000 | ORAL_TABLET | Freq: Two times a day (BID) | ORAL | Status: DC
  Administered 2016-03-07: 500 mg via ORAL
  Filled 2016-03-07: qty 1

## 2016-03-07 MED ORDER — LEVOTHYROXINE SODIUM 200 MCG PO TABS: 200.0000 ug | ORAL_TABLET | Freq: Every day | ORAL | Status: DC

## 2016-03-07 MED ORDER — SODIUM POLYSTYRENE SULFONATE 15 GM/60ML PO SUSP
30.0000 g | Freq: Once | ORAL | Status: AC
  Administered 2016-03-07: 30 g via ORAL
  Filled 2016-03-07: qty 120

## 2016-03-07 MED ORDER — WARFARIN SODIUM 5 MG PO TABS
7.5000 mg | ORAL_TABLET | Freq: Once | ORAL | Status: AC
  Administered 2016-03-07: 7.5 mg via ORAL
  Filled 2016-03-07: qty 1

## 2016-03-07 MED ORDER — ISOSORBIDE MONONITRATE ER 30 MG PO TB24: 30.0000 mg | ORAL_TABLET | Freq: Two times a day (BID) | ORAL | Status: DC
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
## 2016-03-07 NOTE — Progress Notes (Signed)
Patient discharged to [facility] via ambulance, patient discharge packet prepared by CSW and report called to Pablo Lawrence nursing supervisor at [facility]. Patient denies any pain/distress., no skin issue or wound  noted, skin intact.

## 2016-03-07 NOTE — Clinical Social Work Placement (Signed)
   CLINICAL SOCIAL WORK PLACEMENT  NOTE  Date:  03/07/2016  Patient Details  Name: Frank Mclean MRN: [id number] Date of Birth: 03-05-1941  Clinical Social Work is seeking post-discharge placement for this patient at the Skilled  Nursing Facility level of care (*CSW will initial, date and re-position this form in  chart as items are completed):  Yes   Patient/family provided with [facility] Department's list of facilities offering this level of care within the geographic area requested by the patient (or if unable, by the patient's family).  Yes   Patient/family informed of their freedom to choose among providers that offer the needed level of care, that participate in Medicare, Medicaid or managed care program needed by the patient, have an available bed and are willing to accept the patient.      Patient/family informed of [facility]'s ownership interest in [facility] and [facility], as well as of the fact that they are under no obligation to receive care at these facilities.  PASRR submitted to EDS on       PASRR number received on       Existing PASRR number confirmed on       FL2 transmitted to all facilities in geographic area requested by pt/family on 03/05/16     FL2 transmitted to all facilities within larger geographic area on       Patient informed that his/her managed care company has contracts with or will negotiate with certain facilities, including the following:        Yes   Patient/family informed of bed offers received.  Patient chooses bed at [facility]     Physician recommends and patient chooses bed at [facility]    Patient to be transferred to   on  .  Patient to be transferred to facility by EMS     Patient family notified on 03/07/16 of transfer.  Name of family member notified:  Spouse     PHYSICIAN Please sign FL2, Please sign DNR     Additional Comment: Pt / spouse are in agreement with d/c plans for today to  [facility]. [facility] transport is needed. Medical necessity form completed. Spouse is aware out of pocket costs may be associated with [facility] transport. D/C Summary sent to SNF for review. Scripts included in d/c packet. # for report provided to nsg.   _______________________________________________ Royetta Asal, LCSW [phone] 03/07/2016, 2:38 PM

## 2016-03-07 NOTE — Progress Notes (Signed)
ANTICOAGULATION CONSULT NOTE - Follow-Up  Pharmacy Consult for Warfarin Indication: atrial fibrillation  Allergies  Allergen Reactions  . Levothyroxine Sodium Other (See Comments)    MUST TAKE BRAND NAME GENERIC DOESN'T WORK FOR PATIENT    Patient Measurements: Height: 6' 2.5" (189.2 cm) Weight: 222 lb 0.1 oz (100.7 kg) IBW/kg (Calculated) : 83.35   Vital Signs: Temp: 98.3 F (36.8 C) (03/19 0417) Temp Source: Oral (03/19 0417) BP: 114/81 mmHg (03/19 0908) Pulse Rate: 85 (03/19 0908)  Labs:  Recent Labs  03/05/16 0429 03/06/16 0538 03/07/16 0535  HGB  --  9.7*  --   HCT  --  31.9*  --   PLT  --  146*  --   LABPROT 24.1* 19.4* 19.2*  INR 2.19* 1.64* 1.62*  CREATININE 2.46* 2.26* 2.32*    Estimated Creatinine Clearance: 34.6 mL/min (by C-G formula based on Cr of 2.32).   PTA warfarin dose:  2.5mg  M/W/F and 5mg  Tues/Thur/Sat/Sun  Assessment: 72 yoM s/p fall, found to have hypernatremia.  On chronic warfarin for A-fib. INR 1.47 on admission. Pharmacy was asked to dose warfarin. CT negative for hemorrhage.   03/05/2016:  INR is sub-therapeutic today on home regimen (1.62)  CBC stable. No bleeding reported/documented.  Diet: Regular, eating ~ 75% of meal tray  Drug interactions: none  Goal of Therapy:  INR 2-3    Plan:  Give Warfarin 7.5 mg x1 tonight (increased dose) Check PT/INR daily.  Junita Push, PharmD, BCPS Pager: [phone] 03/07/2016,9:17 AM.

## 2016-03-07 NOTE — Discharge Instructions (Signed)
Follow with Primary MD [name], MD in 7 days   Get CBC, CMP, 2 view Chest X ray checked  by Primary MD next visit.    Activity: As tolerated with Full fall precautions use walker/cane & assistance as needed   Disposition SNF   Diet: Heart Healthy , carbohydrate modified , with feeding assistance and aspiration precautions.  For Heart failure patients - Check your Weight same time everyday, if you gain over 2 pounds, or you develop in leg swelling, experience more shortness of breath or chest pain, call your Primary MD immediately. Follow Cardiac Low Salt Diet and 1.5 lit/day fluid restriction.   On your next visit with your primary care physician please Get Medicines reviewed and adjusted.   Please request your Prim.MD to go over all Hospital Tests and Procedure/Radiological results at the follow up, please get all Hospital records sent to your Prim MD by signing hospital release before you go home.   If you experience worsening of your admission symptoms, develop shortness of breath, life threatening emergency, suicidal or homicidal thoughts you must seek medical attention immediately by calling 911 or calling your MD immediately  if symptoms less severe.  You Must read complete instructions/literature along with all the possible adverse reactions/side effects for all the Medicines you take and that have been prescribed to you. Take any new Medicines after you have completely understood and accpet all the possible adverse reactions/side effects.   Do not drive, operating heavy machinery, perform activities at heights, swimming or participation in water activities or provide baby sitting services if your were admitted for syncope or siezures until you have seen by Primary MD or a Neurologist and advised to do so again.  Do not drive when taking Pain medications.    Do not take more than prescribed Pain, Sleep and Anxiety Medications  Special Instructions: If you have smoked or  chewed Tobacco  in the last 2 yrs please stop smoking, stop any regular Alcohol  and or any Recreational drug use.  Wear Seat belts while driving.   Please note  You were cared for by a hospitalist during your hospital stay. If you have any questions about your discharge medications or the care you received while you were in the hospital after you are discharged, you can call the unit and asked to speak with the hospitalist on call if the hospitalist that took care of you is not available. Once you are discharged, your primary care physician will handle any further medical issues. Please note that NO REFILLS for any discharge medications will be authorized once you are discharged, as it is imperative that you return to your primary care physician (or establish a relationship with a primary care physician if you do not have one) for your aftercare needs so that they can reassess your need for medications and monitor your lab values.

## 2016-03-08 ENCOUNTER — Non-Acute Institutional Stay (SKILLED_NURSING_FACILITY): Payer: Medicare Other | Admitting: Adult Health

## 2016-03-08 ENCOUNTER — Encounter: Payer: Self-pay | Admitting: Adult Health

## 2016-03-08 DIAGNOSIS — F32A Depression, unspecified: Secondary | ICD-10-CM

## 2016-03-08 DIAGNOSIS — D638 Anemia in other chronic diseases classified elsewhere: Secondary | ICD-10-CM

## 2016-03-08 DIAGNOSIS — I5022 Chronic systolic (congestive) heart failure: Secondary | ICD-10-CM

## 2016-03-08 DIAGNOSIS — N390 Urinary tract infection, site not specified: Secondary | ICD-10-CM | POA: Diagnosis not present

## 2016-03-08 DIAGNOSIS — E785 Hyperlipidemia, unspecified: Secondary | ICD-10-CM | POA: Diagnosis not present

## 2016-03-08 DIAGNOSIS — N400 Enlarged prostate without lower urinary tract symptoms: Secondary | ICD-10-CM

## 2016-03-08 DIAGNOSIS — E876 Hypokalemia: Secondary | ICD-10-CM

## 2016-03-08 DIAGNOSIS — R531 Weakness: Secondary | ICD-10-CM

## 2016-03-08 DIAGNOSIS — N183 Chronic kidney disease, stage 3 unspecified: Secondary | ICD-10-CM

## 2016-03-08 DIAGNOSIS — E039 Hypothyroidism, unspecified: Secondary | ICD-10-CM

## 2016-03-08 DIAGNOSIS — F329 Major depressive disorder, single episode, unspecified: Secondary | ICD-10-CM

## 2016-03-08 DIAGNOSIS — I480 Paroxysmal atrial fibrillation: Secondary | ICD-10-CM

## 2016-03-08 NOTE — Progress Notes (Signed)
Patient ID: Frank Mclean, male   DOB: 1941/07/07, 75 y.o.   MRN: [id number]    DATE:     03/08/16  MRN:  [id number]  BIRTHDAY: 17-Jan-1941  Facility:  Nursing Home Location:  [facility]  Nursing Home Room Number: [id number]  LEVEL OF CARE:  SNF (31)  Contact Information    Name Relation Home Work Mobile   [place] Spouse [phone]  [phone]   Smith,Brandon Relative [phone]     Andree Coss Daughter   [phone]       Code Status History    Date Active Date Inactive Code Status Order ID Comments User Context   03/03/2016  1:05 AM 03/07/2016  8:47 PM DNR [id number]  Alberteen Sam, MD Inpatient   08/21/2015 11:54 AM 08/26/2015  4:20 PM Full Code [id number]  Alleen Borne, MD Inpatient   03/17/2015  6:46 PM 04/01/2015  2:02 PM Full Code [id number]  Charlton Amor, PA-C Inpatient   03/17/2015  6:46 PM 03/17/2015  6:46 PM Full Code [id number]  Charlton Amor, PA-C Inpatient   03/14/2015  4:55 AM 03/17/2015  6:46 PM Full Code [id number]  Lorretta Harp, MD Inpatient   03/12/2015  7:37 PM 03/13/2015  6:17 PM Full Code [id number]  Marinus Maw, MD Inpatient   03/11/2015  2:53 AM 03/12/2015  7:37 PM Full Code [id number]  Glori Luis, MD ED   02/13/2015 11:08 AM 02/13/2015  3:54 PM Full Code [id number]  Laurey Morale, MD Inpatient   11/26/2013  6:51 AM 11/28/2013  3:23 PM Full Code [id number]  Lynden Oxford, MD Inpatient   06/09/2013  7:53 PM 06/13/2013  5:24 PM Full Code [id number]  Henderson Cloud, MD Inpatient   12/30/2012 12:30 PM 12/31/2012 10:00 PM Full Code [id number]  Hollice Espy, MD Inpatient    Questions for Most Recent Historical Code Status (Order [id number])    Question Answer Comment   In the event of cardiac or respiratory ARREST Do not call a "code blue"    In the event of cardiac or respiratory ARREST Do not perform Intubation, CPR, defibrillation or ACLS    In the event of cardiac or respiratory ARREST Use medication by any route,  position, wound care, and other measures to relive pain and suffering. May use oxygen, suction and manual treatment of airway obstruction as needed for comfort.        Chief Complaint  Patient presents with  . Hospitalization Follow-up    HISTORY OF PRESENT ILLNESS:  This is a 75 year old male who has been admitted to [facility] on 03/07/16 from [facility]. He has PMH of dementia, NICM and systolic and diastolic CHF EF 27% with ICD/Pacer, CKD III, NIDDM, hypothyroidism, PAF on Coumadin and Hx of stroke. He had a fall @ home and was noted to be having more confusion. CT of head with no acute finding. He was treated for with Rocephin for UTI and was discharged with oral Augmentin X 5 days. He was treated, also, for hypernatremia with D5W. He takes KCL 30 meq TID @ home which was given in the hospital. K @ discharge was 5.2 so KCL was been stopped.  He has been admitted for a short-term rehabilitation.  PAST MEDICAL HISTORY:  Past Medical History  Diagnosis Date  . Gout   . HTN (hypertension)   . Hyperlipidemia   . Osteoarthritis   . History of CVA (cerebrovascular accident)     Left pontine infarct  July 2004; changed from Plavix to Coumadin in 2004 per MD at [facility]  . GERD (gastroesophageal reflux disease)   . DJD (degenerative joint disease)   . BPH (benign prostatic hypertrophy)   . Peripheral vascular disease (HCC)   . Bilateral leg ulcer (HCC)     ACHILLES AREA--  NONHEALING  . CKD (chronic kidney disease) stage 3, GFR 30-59 ml/min   . CAD (coronary artery disease)     a. LHC 4/12: Mid LAD 25%, mid diagonal 30%, AV circumflex 40%, proximal OM 25%, distal RCA 40%  . Chronic combined systolic and diastolic heart failure (HCC)     a. Echo 05/2013: EF 25-30%, diffuse HK, restrictive physiology, trivial AI, mild MR, moderate LAE, reduced RV systolic function, PASP 42  . LBBB (left bundle branch block)   . PAF (paroxysmal atrial fibrillation) (HCC)     a. amiodarone Rx  started 05/2013;  b. chronic coumadin  . NICM (nonischemic cardiomyopathy) (HCC)     a. EF 25-30%.  [name] Hx of cardiovascular stress test     Nuclear Stress Test (1/16): High risk stress nuclear study with a large, severe, partially reversible inferior and apical defect consistent with prior inferior and apical infarct; mild apical ischemia; severe LVE; study high risk due to reduced LV function.  EF 25% >> reviewed with Dr. Antoine Poche >> medical mgmt  . Hypothyroidism   . DM (diabetes mellitus), type 2 with renal complications (HCC)   . Presence of permanent cardiac pacemaker   . AICD (automatic cardioverter/defibrillator) present   . Stroke (HCC)   . Chronic combined systolic and diastolic CHF (congestive heart failure) (HCC)   . Shortness of breath dyspnea   . Pneumonia   . Chronic depression   . Hypernatremia   . Memory loss   . Anticoagulated on Coumadin   . Anemia in chronic renal disease   . Elevated troponin   . Hyperbilirubinemia   . Physical deconditioning   . Acute cystitis without hematuria   . Hyperkalemia   . Anemia of chronic disease   . Loose stools      CURRENT MEDICATIONS: Reviewed  Patient's Medications  New Prescriptions   No medications on file  Previous Medications   ACETAMINOPHEN (TYLENOL) 325 MG TABLET    Take 1 tablet (325 mg total) by mouth every 6 (six) hours as needed for mild pain (or Fever >/= 101).   AMOXICILLIN-CLAVULANATE (AUGMENTIN) 500-125 MG TABLET    Take 1 tablet (500 mg total) by mouth 2 (two) times daily. Please take for total of 5 days.   ATORVASTATIN (LIPITOR) 20 MG TABLET    Take 1 tablet (20 mg total) by mouth daily.   CARVEDILOL (COREG) 6.25 MG TABLET    Take 1 tablet (6.25 mg total) by mouth 2 (two) times daily with a meal.   CHOLECALCIFEROL (VITAMIN D-3 PO)    Take 1,000 Units by mouth daily.   ESCITALOPRAM (LEXAPRO) 5 MG TABLET    TAKE 1 TABLET BY MOUTH EVERY DAY   FERROUS SULFATE 325 (65 FE) MG TABLET    Take 1 tablet (325 mg total)  by mouth daily.   HYDRALAZINE (APRESOLINE) 25 MG TABLET    Take 1 tablet (25 mg total) by mouth 3 (three) times daily.   HYDROCODONE-ACETAMINOPHEN (NORCO/VICODIN) 5-325 MG TABLET    Take 1 tablet by mouth every 8 (eight) hours as needed for moderate pain.   ISOSORBIDE MONONITRATE (IMDUR) 30 MG 24 HR TABLET    Take 1 tablet (  30 mg total) by mouth 2 (two) times daily.   LEVOTHYROXINE (SYNTHROID, LEVOTHROID) 200 MCG TABLET    Take 1 tablet (200 mcg total) by mouth daily before breakfast.   MENTHOL, TOPICAL ANALGESIC, (BIOFREEZE) 4 % GEL    Apply topically 2 (two) times daily. Apply to left shoulder and left ankle   OXYGEN    Inhale 2.5 L/min into the lungs See admin instructions. USES OXYGEN EVERY BEDTIME USES DURING DAY ONLY AS NEEDED   SYMBICORT 160-4.5 MCG/ACT INHALER    INHALE 2 PUFFS INTO THE LUNGS 2 TIMES DAILY   TAMSULOSIN (FLOMAX) 0.4 MG CAPS CAPSULE    Take 1 capsule (0.4 mg total) by mouth daily.   TORSEMIDE (DEMADEX) 20 MG TABLET    Take 1 tablet (20 mg total) by mouth daily.   TRIAMCINOLONE CREAM (KENALOG) 0.5 %    Apply topically 2 (two) times daily.   VITAMIN B-12 (CYANOCOBALAMIN) 1000 MCG TABLET    Take 1,000 mcg by mouth daily.   WARFARIN (COUMADIN) 5 MG TABLET    TAKE 1 TABLET (5 MG TOTAL) BY MOUTH AS DIRECTED.  Modified Medications   No medications on file  Discontinued Medications   CHOLECALCIFEROL (VITAMIN D) 1000 UNITS TABLET    Take 1 tablet (1,000 Units total) by mouth daily.   [employer] VITAMIN B12 1000 MCG TABLET    TAKE 1 TABLET BY MOUTH DAILY   LOPERAMIDE (IMODIUM) 2 MG CAPSULE    Take 2-4 mg by mouth as needed for diarrhea or loose stools. Give 2 tabs PO with initial episode of diarrhea, then 1 tab with each subsequent episode, not to exceed 16 mg/24hr     Allergies  Allergen Reactions  . Levothyroxine Sodium Other (See Comments)    MUST TAKE BRAND NAME GENERIC DOESN'T WORK FOR PATIENT     REVIEW OF SYSTEMS:  GENERAL: no change in appetite, no fatigue, no weight  changes, no fever, chills or weakness EYES: Denies change in vision, dry eyes, eye pain, itching or discharge EARS: Denies change in hearing, ringing in ears, or earache NOSE: Denies nasal congestion or epistaxis MOUTH and THROAT: Denies oral discomfort, gingival pain or bleeding, pain from teeth or hoarseness   RESPIRATORY: no cough, SOB, DOE, wheezing, hemoptysis CARDIAC: no chest pain, edema or palpitations GI: no abdominal pain, diarrhea, constipation, heart burn, nausea or vomiting GU: Denies dysuria, frequency, hematuria, incontinence, or discharge PSYCHIATRIC: Denies feeling of depression or anxiety. No report of hallucinations, insomnia, paranoia, or agitation   PHYSICAL EXAMINATION  GENERAL APPEARANCE: Well nourished. In no acute distress. Normal body habitus SKIN:  Skin is warm and dry.  HEAD: Normal in size and contour. No evidence of trauma EYES: Lids open and close normally. No blepharitis, entropion or ectropion. PERRL. Conjunctivae are clear and sclerae are white. Lenses are without opacity EARS: Pinnae are normal. Patient hears normal voice tunes of the examiner MOUTH and THROAT: Lips are without lesions. Oral mucosa is moist and without lesions. Tongue is normal in shape, size, and color and without lesions NECK: supple, trachea midline, no neck masses, no thyroid tenderness, no thyromegaly LYMPHATICS: no LAN in the neck, no supraclavicular LAN RESPIRATORY: breathing is even & unlabored, BS CTAB CARDIAC: RRR, no murmur,no extra heart sounds, no edema GI: abdomen soft, normal BS, no masses, no tenderness, no hepatomegaly, no splenomegaly EXTREMITIES:  Able to move X 4 3xtremities PSYCHIATRIC: Alert and oriented X 3. Affect and behavior are appropriate  LABS/RADIOLOGY: Labs reviewed: Basic Metabolic Panel:  Recent  Labs  06/20/15 1107  03/03/16 0513  03/05/16 0429 03/06/16 0538  03/07/16 0535 03/10/16 03/11/16  NA 146*  < > 152*  < > 145 140  < > 140 150* 149*  K  4.5  < > 3.1*  < > 4.2 4.9  --  5.2* 3.3* 3.2*  CL 110  < > 118*  < > 114* 107  --  110  --   --   CO2 28  < > 25  < > 22 21*  --  20*  --   --   GLUCOSE 100*  < > 148*  < > 123* 128*  --  108*  --   --   BUN 18  < > 30*  < > 31* 29*  < > 34* 18 15  CREATININE 1.96*  < > 2.13*  < > 2.46* 2.26*  < > 2.32* 1.8* 1.8*  CALCIUM 9.3  < > 8.9  < > 8.6* 8.8*  --  9.1  --   --   MG 2.1  --  1.9  --  2.0  --   --   --   --   --   < > = values in this interval not displayed. Liver Function Tests:  Recent Labs  08/12/15 1147 03/02/16 2227 03/03/16 0513  AST ALT 13* 28 23  ALKPHOS 90 124 100  BILITOT 0.7 1.7* 0.9  PROT 5.7* 6.4* 5.5*  ALBUMIN 3.5 3.8 3.3*    Recent Labs  03/13/15 2358  LIPASE 14   CBC:  Recent Labs  03/02/16 2227 03/03/16 0513 03/04/16 0503  03/06/16 0538 03/10/16 03/11/16  WBC 8.1 6.6 8.1  < > 7.8 8.1 8.6  NEUTROABS 7.6  --   --   --   --  6 7  HGB 11.0* 9.6* 9.0*  --  9.7* 9.7* 9.4*  HCT 36.0* 31.5* 30.5*  --  31.9* 33* 32*  MCV 81.6 79.7 83.8  --  81.6  --   --   PLT 158 164 139*  --  146* 256 259  < > = values in this interval not displayed.  Cardiac Enzymes:  Recent Labs  03/02/16 2227 03/03/16 0513 03/04/16 0503  TROPONINI 0.24* 0.44* 0.21*   CBG:  Recent Labs  03/06/16 2111 03/07/16 0721 03/07/16 1138  GLUCAP 104* 101* 111*    Ct Head Wo Contrast  03/04/2016  CLINICAL DATA:  Dementia/ s/p fall. Pt fell on 3-14, unknown LOC, pt denies any pain, on blood thinners, unknown if hit head, hx dementia EXAM: CT HEAD WITHOUT CONTRAST TECHNIQUE: Contiguous axial images were obtained from the base of the skull through the vertex without intravenous contrast. COMPARISON:  03/25/2015 FINDINGS: There is central and cortical atrophy. Periventricular white matter changes are consistent with small vessel disease. There is a small remote lacunar infarct in the right thalamus. Bone windows demonstrate no focal edema or hematoma of the scalp. No  calvarial fracture. There atherosclerotic calcification of the internal carotid arteries. IMPRESSION: 1. Atrophy and small vessel disease. 2. Remote right the laminae infarct. 3. No evidence for acute intracranial abnormality. Electronically Signed   By: Norva Pavlov M.D.   On: 03/04/2016 09:17   Dg Chest Port 1 View  03/02/2016  CLINICAL DATA:  Fall this morning, now with weakness. EXAM: PORTABLE CHEST 1 VIEW COMPARISON:  None. FINDINGS: There is marked cardiomegaly. Left chest wall pacemaker/AICD in place without evidence of wire discontinuity. There is mild  central pulmonary vascular congestion without frank alveolar pulmonary edema. Lungs are otherwise clear. Small portion of the right costophrenic angle is excluded on this exam. Osseous structures about the chest are unremarkable. IMPRESSION: 1. Marked cardiomegaly. Mild central pulmonary vascular congestion, probably chronic, without overt pulmonary edema. 2. Lungs are otherwise clear. No evidence of pneumonia or pleural effusion. This exam was interpreted during a PACS downtime with limited availability of comparison cases. It has been flagged for review following the downtime. If clinically indicated after this review, an addendum will be issued providing details about comparison to prior imaging. Electronically Signed   By: Bary Richard M.D.   On: 03/02/2016 22:23    ASSESSMENT/PLAN:  Generalized weakness - for rehabilitation  UTI - continue Augmentin 875 mg BID till 03/12/16  Hypokalemia - K 5.2; not on any supplementation; check BMP  Anemia of chronic disease - hgb 9.7; check CBC  Hyperlipidemia  - continue Atorvastatin 20 mg daily  Combined systolic and diastolic CHF -  Has pacer/ICD; continue Coreg 6.25 mg BID, hydralazine 25 mg TID, Imdur ER 30 mg BID and torsemide 20 mg daily  Chronic Depression - mood is stable; continue Lexapro 5 mg daily  BPH - continue Flomax 0.4 mg 1 capsule daily  CKD stage 3 - creatinine 2.32; will  monitor  Atrial fibrillation - rate-controlled; has pacer/ICD; continue Coreg and Coumadin  Hypothyroidism - continue Levothyroxine 200 mcg daily Q D      Goals of care:  Short-term rehabilitation     [facility], NP [facility] [phone]

## 2016-03-09 ENCOUNTER — Encounter: Payer: Self-pay | Admitting: Adult Health

## 2016-03-09 ENCOUNTER — Non-Acute Institutional Stay (SKILLED_NURSING_FACILITY): Payer: Medicare Other | Admitting: Internal Medicine

## 2016-03-09 ENCOUNTER — Encounter: Payer: Self-pay | Admitting: Internal Medicine

## 2016-03-09 ENCOUNTER — Telehealth: Payer: Self-pay | Admitting: *Deleted

## 2016-03-09 DIAGNOSIS — E039 Hypothyroidism, unspecified: Secondary | ICD-10-CM

## 2016-03-09 DIAGNOSIS — D638 Anemia in other chronic diseases classified elsewhere: Secondary | ICD-10-CM | POA: Diagnosis not present

## 2016-03-09 DIAGNOSIS — E875 Hyperkalemia: Secondary | ICD-10-CM | POA: Diagnosis not present

## 2016-03-09 DIAGNOSIS — R195 Other fecal abnormalities: Secondary | ICD-10-CM

## 2016-03-09 DIAGNOSIS — F32A Depression, unspecified: Secondary | ICD-10-CM

## 2016-03-09 DIAGNOSIS — N184 Chronic kidney disease, stage 4 (severe): Secondary | ICD-10-CM | POA: Diagnosis not present

## 2016-03-09 DIAGNOSIS — I5042 Chronic combined systolic (congestive) and diastolic (congestive) heart failure: Secondary | ICD-10-CM | POA: Diagnosis not present

## 2016-03-09 DIAGNOSIS — R5381 Other malaise: Secondary | ICD-10-CM

## 2016-03-09 DIAGNOSIS — N3000 Acute cystitis without hematuria: Secondary | ICD-10-CM

## 2016-03-09 DIAGNOSIS — I10 Essential (primary) hypertension: Secondary | ICD-10-CM

## 2016-03-09 DIAGNOSIS — I480 Paroxysmal atrial fibrillation: Secondary | ICD-10-CM | POA: Diagnosis not present

## 2016-03-09 DIAGNOSIS — N400 Enlarged prostate without lower urinary tract symptoms: Secondary | ICD-10-CM

## 2016-03-09 DIAGNOSIS — F329 Major depressive disorder, single episode, unspecified: Secondary | ICD-10-CM

## 2016-03-09 DIAGNOSIS — E785 Hyperlipidemia, unspecified: Secondary | ICD-10-CM

## 2016-03-09 NOTE — Telephone Encounter (Signed)
Pt was on TCM list admitted for renal Insuff. & Hypernatremia, & elev Troponin. D/C 03/07/16 sent to SNF...Raechel Chute

## 2016-03-09 NOTE — Progress Notes (Signed)
LOCATION: [facility]  PCP: [name], MD   Code Status: Full Code  Goals of care: Advanced Directive information Advanced Directives 03/03/2016  Does patient have an advance directive? No  Type of Advance Directive -  Does patient want to make changes to advanced directive? -  Copy of advanced directive(s) in chart? -  Would patient like information on creating an advanced directive? No - patient declined information      Extended Emergency Contact Information Primary Emergency Contact: Mcfayden,Fredonia Joy Address: [address]          [place], [place] [address] [place] Home Phone: [phone] Mobile Phone: [phone] Relation: Spouse Secondary Emergency Contact: Smith,Brandon  [place] Home Phone: [phone] Relation: Relative   Allergies  Allergen Reactions  . Levothyroxine Sodium Other (See Comments)    MUST TAKE BRAND NAME GENERIC DOESN'T WORK FOR PATIENT    Chief Complaint  Patient presents with  . New Admit To SNF    New Admission     HPI:  Patient is a 75 y.o. male seen today for short term rehabilitation post hospital admission from 03/02/16-03/07/16 with fall and generalized weakness in setting of hypernatremia and renal insufficiency along with urinary tract infection. CT head was negative for acute intracranial abnormalities. He was treated with antibiotics for Klebsiella UTI and D5W for hypernatremia. His BP medications were adjusted with low blood pressure reading. He has PMH of DM, ckd stage 3, CHF, dementia, afib, depression among others. He is seen in his room today. He complaints of loose stool x 2 today.    Review of Systems:  Constitutional: Negative for fever, chills. HENT: Negative for congestion. Has nasal discharge.  Eyes: Negative for blurred vision, double vision and discharge.  Respiratory: positive for cough. Negative for shortness of breath and wheezing.   Cardiovascular: Negative for  chest pain, palpitations, orthopnea and leg swelling.  Gastrointestinal: Negative for heartburn, nausea, vomiting, abdominal pain.  Genitourinary: Negative for dysuria Musculoskeletal: Negative for back pain, fall in the facility Skin: Negative for itching and rash.  Neurological: Negative for dizziness  Psychiatric/Behavioral: Negative for depression    Past Medical History  Diagnosis Date  . Gout   . HTN (hypertension)   . Hyperlipidemia   . Osteoarthritis   . History of CVA (cerebrovascular accident)     Left pontine infarct July 2004; changed from Plavix to Coumadin in 2004 per MD at [facility]  . GERD (gastroesophageal reflux disease)   . DJD (degenerative joint disease)   . BPH (benign prostatic hypertrophy)   . Peripheral vascular disease (HCC)   . Bilateral leg ulcer (HCC)     ACHILLES AREA--  NONHEALING  . CKD (chronic kidney disease) stage 3, GFR 30-59 ml/min   . CAD (coronary artery disease)     a. LHC 4/12: Mid LAD 25%, mid diagonal 30%, AV circumflex 40%, proximal OM 25%, distal RCA 40%  . Chronic combined systolic and diastolic heart failure (HCC)     a. Echo 05/2013: EF 25-30%, diffuse HK, restrictive physiology, trivial AI, mild MR, moderate LAE, reduced RV systolic function, PASP 42  . LBBB (left bundle branch block)   . PAF (paroxysmal atrial fibrillation) (HCC)     a. amiodarone Rx started 05/2013;  b. chronic coumadin  . NICM (nonischemic cardiomyopathy) (HCC)     a. EF 25-30%.  [name] Hx of cardiovascular stress test     Nuclear Stress Test (1/16): High risk stress nuclear study  with a large, severe, partially reversible inferior and apical defect consistent with prior inferior and apical infarct; mild apical ischemia; severe LVE; study high risk due to reduced LV function.  EF 25% >> reviewed with Dr. Antoine Poche >> medical mgmt  . Hypothyroidism   . DM (diabetes mellitus), type 2 with renal complications (HCC)   . Presence of permanent cardiac pacemaker   . AICD  (automatic cardioverter/defibrillator) present   . Stroke (HCC)   . CHF (congestive heart failure) (HCC)   . Shortness of breath dyspnea   . Pneumonia   . Depression   . Hypernatremia   . Memory loss   . Anticoagulated on Coumadin   . Anemia in chronic renal disease   . Elevated troponin   . Hyperbilirubinemia    Past Surgical History  Procedure Laterality Date  . Cardiac catheterization  04-16-2011   [facility]    NON-OBSTRUCTIVE CAD. MILDLY ELEVATED PULMONARY PRESSURES/ ELEVATED  END-DIASTOLIC PRESSURE  . Transthoracic echocardiogram  12-31-2010    MODERATE CONCENTRIC LVH/ SYSTOLIC FUNCTION SEVERELY REDUCED/ EF 25-30%/  SEVERE HYPOKINESIS OF ANTEROSEPTAL MYOCARDIUM  AND ENTIREAPICAL MYOCARDIUM /  MODERATE HYPOKINESIS OF LATERAL, INFEROLATERAL, INFERIOR,AND INFEROSEPTAL MYOCARIUM/  GRADE 3 DIASTOLIC DYSFUNCTION/ MILD MR  . Aortogram w/ bilateral lower extremitiy runoff  05-18-2013  DR FIELDS    LEFT LEG OCCLUDED PERONEAL AND ANTERIOR TIBIAL ARTERIES/ HIGH GRADE STENOIS 80% MIDDLE AND DISTAL THIRD OF POSTERIOR TIBIAL ARTERY/ RIGHT PERONEAL AND ANTERIOR TIBIAL ARTERY OCCLUDED/ 40% STENOSIS DISTALLY   . Abdominal aortagram N/A 05/18/2013    Procedure: ABDOMINAL Ronny Flurry;  Surgeon: Sherren Kerns, MD;  Location: [facility] CATH LAB;  Service: Cardiovascular;  Laterality: N/A;  . Lower extremity angiogram Bilateral 05/18/2013    Procedure: LOWER EXTREMITY ANGIOGRAM;  Surgeon: Sherren Kerns, MD;  Location: [facility] CATH LAB;  Service: Cardiovascular;  Laterality: Bilateral;  . Right heart catheterization N/A 02/13/2015    Procedure: RIGHT HEART CATH;  Surgeon: Laurey Morale, MD;  Location: [facility] CATH LAB;  Service: Cardiovascular;  Laterality: N/A;  . Bi-ventricular implantable cardioverter defibrillator N/A 03/12/2015    Procedure: BI-VENTRICULAR IMPLANTABLE CARDIOVERTER DEFIBRILLATOR  (CRT-D);  Surgeon: Marinus Maw, MD;  Location: [facility] CATH LAB;  Service: Cardiovascular;  Laterality: N/A;  .  Epicardial pacing lead placement N/A 08/21/2015    Procedure: EPICARDIAL PACING LEAD PLACEMENT;  Surgeon: Alleen Borne, MD;  Location: [facility] OR;  Service: Thoracic;  Laterality: N/A;  . Thoracotomy Left 08/21/2015    Procedure: MINI/LIMITED THORACOTOMY;  Surgeon: Alleen Borne, MD;  Location: [facility] OR;  Service: Thoracic;  Laterality: Left;   Social History:   reports that he quit smoking about 41 years ago. He has never used smokeless tobacco. He reports that he does not drink alcohol or use illicit drugs.  Family History  Problem Relation Age of Onset  . Hypertension Mother   . Heart disease Mother   . Diabetes Mother   . Gout Other   . Stroke Other   . Hypertension Father   . Heart disease Father   . Heart attack Maternal Grandmother   . Thyroid disease Neg Hx     Medications:   Medication List       This list is accurate as of: 03/09/16  4:03 PM.  Always use your most recent med list.               acetaminophen 325 MG tablet  Commonly known as:  TYLENOL  Take 1 tablet (325 mg total) by  mouth every 6 (six) hours as needed for mild pain (or Fever >/= 101).     amoxicillin-clavulanate 500-125 MG tablet  Commonly known as:  AUGMENTIN  Take 1 tablet (500 mg total) by mouth 2 (two) times daily. Please take for total of 5 days.     atorvastatin 20 MG tablet  Commonly known as:  LIPITOR  Take 1 tablet (20 mg total) by mouth daily.     carvedilol 6.25 MG tablet  Commonly known as:  COREG  Take 1 tablet (6.25 mg total) by mouth 2 (two) times daily with a meal.     [employer] VITAMIN B12 1000 MCG tablet  Generic drug:  cyanocobalamin  TAKE 1 TABLET BY MOUTH DAILY     escitalopram 5 MG tablet  Commonly known as:  LEXAPRO  TAKE 1 TABLET BY MOUTH EVERY DAY     ferrous sulfate 325 (65 FE) MG tablet  Take 1 tablet (325 mg total) by mouth daily.     hydrALAZINE 25 MG tablet  Commonly known as:  APRESOLINE  Take 1 tablet (25 mg total) by mouth 3 (three) times daily.      HYDROcodone-acetaminophen 5-325 MG tablet  Commonly known as:  NORCO/VICODIN  Take 1 tablet by mouth every 8 (eight) hours as needed for moderate pain.     isosorbide mononitrate 30 MG 24 hr tablet  Commonly known as:  IMDUR  Take 1 tablet (30 mg total) by mouth 2 (two) times daily.     levothyroxine 200 MCG tablet  Commonly known as:  SYNTHROID  Take 1 tablet (200 mcg total) by mouth daily before breakfast.     OXYGEN  Inhale 2.5 L/min into the lungs See admin instructions. USES OXYGEN EVERY BEDTIME USES DURING DAY ONLY AS NEEDED     SYMBICORT 160-4.5 MCG/ACT inhaler  Generic drug:  budesonide-formoterol  INHALE 2 PUFFS INTO THE LUNGS 2 TIMES DAILY     tamsulosin 0.4 MG Caps capsule  Commonly known as:  FLOMAX  Take 1 capsule (0.4 mg total) by mouth daily.     torsemide 20 MG tablet  Commonly known as:  DEMADEX  Take 1 tablet (20 mg total) by mouth daily.     triamcinolone cream 0.5 %  Commonly known as:  KENALOG  Apply topically 2 (two) times daily.     VITAMIN D-3 PO  Take 1,000 Units by mouth daily.     warfarin 5 MG tablet  Commonly known as:  COUMADIN  TAKE 1 TABLET (5 MG TOTAL) BY MOUTH AS DIRECTED.        Immunizations: Immunization History  Administered Date(s) Administered  . PPD Test 03/07/2016  . Pneumococcal-Unspecified 10/04/2013  . Td 12/21/2007     Physical Exam: Filed Vitals:   03/09/16 1555  BP: 131/86  Pulse: 110  Temp: 99 F (37.2 C)  TempSrc: Oral  Resp: 20  Height: 6' 2.5" (1.892 m)  Weight: 203 lb (92.08 kg)  SpO2: 95%   Body mass index is 25.72 kg/(m^2).  General- elderly male in no acute distress Head- atraumatic, normocephalic Eyes- PERRLA, EOMI, no pallor, no icterus Neck- no lymphadenopathy Mouth- moist mucus membrane. Has dentures. Cardiovascular- normal s1,s2, no murmur Respiratory- bilateral clear to auscultation, no wheeze, no rhonchi, no crackles Abdomen- bowel sounds present, soft, non tender Musculoskeletal-  able to move all 4 extremities, generalized weakness, on wheelchair, has 1+ leg edema Neurological- alert and oriented to person, place and time   Labs reviewed: Basic Metabolic Panel:  Recent Labs  06/20/15 1107  03/03/16 0513  03/05/16 0429 03/06/16 0538 03/07/16 03/07/16 0535  NA 146*  < > 152*  < > 145 140 140 140  K 4.5  < > 3.1*  < > 4.2 4.9  --  5.2*  CL 110  < > 118*  < > 114* 107  --  110  CO2 28  < > 25  < > 22 21*  --  20*  GLUCOSE 100*  < > 148*  < > 123* 128*  --  108*  BUN 18  < > 30*  < > 31* 29* 34* 34*  CREATININE 1.96*  < > 2.13*  < > 2.46* 2.26* 2.3* 2.32*  CALCIUM 9.3  < > 8.9  < > 8.6* 8.8*  --  9.1  MG 2.1  --  1.9  --  2.0  --   --   --   < > = values in this interval not displayed. Liver Function Tests:  Recent Labs  08/12/15 1147 03/02/16 2227 03/03/16 0513  AST 15 24 17   ALT 13* 28 23  ALKPHOS 90 124 100  BILITOT 0.7 1.7* 0.9  PROT 5.7* 6.4* 5.5*  ALBUMIN 3.5 3.8 3.3*    Recent Labs  03/13/15 2358  LIPASE 14   No results for input(s): AMMONIA in the last 8760 hours. CBC:  Recent Labs  03/18/15 0548  04/21/15 1440  03/02/16 2227 03/03/16 0513 03/04/16 0503 03/06/16 03/06/16 0538  WBC 13.0*  < > 9.5  < > 8.1 6.6 8.1 7.8 7.8  NEUTROABS 11.9*  --  7.7  --  7.6  --   --   --   --   HGB 8.8*  < > 10.7*  < > 11.0* 9.6* 9.0*  --  9.7*  HCT 28.5*  < > 33.5*  < > 36.0* 31.5* 30.5*  --  31.9*  MCV 83.1  < > 76.5*  < > 81.6 79.7 83.8  --  81.6  PLT 238  < > 343.0  < > 158 164 139*  --  146*  < > = values in this interval not displayed. Cardiac Enzymes:  Recent Labs  03/02/16 2227 03/03/16 0513 03/04/16 0503  TROPONINI 0.24* 0.44* 0.21*   BNP: Invalid input(s): POCBNP CBG:  Recent Labs  03/06/16 2111 03/07/16 0721 03/07/16 1138  GLUCAP 104* 101* 111*    Radiological Exams: Ct Head Wo Contrast  03/04/2016  CLINICAL DATA:  Dementia/ s/p fall. Pt fell on 3-14, unknown LOC, pt denies any pain, on blood thinners, unknown if  hit head, hx dementia EXAM: CT HEAD WITHOUT CONTRAST TECHNIQUE: Contiguous axial images were obtained from the base of the skull through the vertex without intravenous contrast. COMPARISON:  03/25/2015 FINDINGS: There is central and cortical atrophy. Periventricular white matter changes are consistent with small vessel disease. There is a small remote lacunar infarct in the right thalamus. Bone windows demonstrate no focal edema or hematoma of the scalp. No calvarial fracture. There atherosclerotic calcification of the internal carotid arteries. IMPRESSION: 1. Atrophy and small vessel disease. 2. Remote right the laminae infarct. 3. No evidence for acute intracranial abnormality. Electronically Signed   By: Norva Pavlov M.D.   On: 03/04/2016 09:17   Dg Chest Port 1 View  03/02/2016  CLINICAL DATA:  Fall this morning, now with weakness. EXAM: PORTABLE CHEST 1 VIEW COMPARISON:  None. FINDINGS: There is marked cardiomegaly. Left chest wall pacemaker/AICD in place without evidence of wire discontinuity.  There is mild central pulmonary vascular congestion without frank alveolar pulmonary edema. Lungs are otherwise clear. Small portion of the right costophrenic angle is excluded on this exam. Osseous structures about the chest are unremarkable. IMPRESSION: 1. Marked cardiomegaly. Mild central pulmonary vascular congestion, probably chronic, without overt pulmonary edema. 2. Lungs are otherwise clear. No evidence of pneumonia or pleural effusion. This exam was interpreted during a PACS downtime with limited availability of comparison cases. It has been flagged for review following the downtime. If clinically indicated after this review, an addendum will be issued providing details about comparison to prior imaging. Electronically Signed   By: Bary Richard M.D.   On: 03/02/2016 22:23    Assessment/Plan  Physical deconditioning Will have him work with physical therapy and occupational therapy team to help with  gait training and muscle strengthening exercises.fall precautions. Skin care. Encourage to be out of bed.   Acute cystitis Continue and complete augmentin 875 mg bid on 03/12/16. Maintain hydration  Hyperkalemia Check bmp  Anemia of chronic disease Continue iron supplement and b12 supplement  HLD Continue lipitor  HTN Monitor bp, continue coreg 6.25 mg bid, hydralazine 25 mg tid, imdur 30 mg bid. Check bmp  CHF Has AICD. Continue hydralazine, coreg, imdur and torsemide. Monitor bmp and weight  Chronic depression Continue lexapro 5 mg daily  BPH Stable, continue flomax  ckd stage 4 Monitor renal function  afib Rate controlled. Continue coreg and coumadin, monitor INR  Loose stool Monitor clinically, maintain hydration, check bmp. Continue imodium    Goals of care: short term rehabilitation   Labs/tests ordered: cbc, cmp 03/10/16  Family/ staff Communication: reviewed care plan with patient and nursing supervisor    Oneal Grout, MD Internal Medicine [facility] [address] [place], [place] [address] Cell Phone (Monday-Friday 8 am - 5 pm): [phone] On Call: [phone] and follow prompts after 5 pm and on weekends Office Phone: [phone] Office Fax: [phone]

## 2016-03-10 ENCOUNTER — Encounter: Payer: Medicare Other | Admitting: Internal Medicine

## 2016-03-10 LAB — BASIC METABOLIC PANEL
BUN: 18 mg/dL (ref 4–21)
CREATININE: 1.8 mg/dL — AB (ref 0.6–1.3)
GLUCOSE: 85 mg/dL
POTASSIUM: 3.3 mmol/L — AB (ref 3.4–5.3)
SODIUM: 150 mmol/L — AB (ref 137–147)

## 2016-03-10 LAB — CBC AND DIFFERENTIAL
HEMATOCRIT: 33 % — AB (ref 41–53)
Hemoglobin: 9.7 g/dL — AB (ref 13.5–17.5)
NEUTROS ABS: 6 /uL
Platelets: 256 10*3/uL (ref 150–399)
WBC: 8.1 10^3/mL

## 2016-03-11 ENCOUNTER — Encounter: Payer: Self-pay | Admitting: Internal Medicine

## 2016-03-11 LAB — BASIC METABOLIC PANEL
BUN: 15 mg/dL (ref 4–21)
Creatinine: 1.8 mg/dL — AB (ref 0.6–1.3)
GLUCOSE: 89 mg/dL
POTASSIUM: 3.2 mmol/L — AB (ref 3.4–5.3)
SODIUM: 149 mmol/L — AB (ref 137–147)

## 2016-03-11 LAB — CBC AND DIFFERENTIAL
HEMATOCRIT: 32 % — AB (ref 41–53)
HEMOGLOBIN: 9.4 g/dL — AB (ref 13.5–17.5)
Neutrophils Absolute: 7 /uL
Platelets: 259 10*3/uL (ref 150–399)
WBC: 8.6 10^3/mL

## 2016-03-12 ENCOUNTER — Non-Acute Institutional Stay (SKILLED_NURSING_FACILITY): Payer: Medicare Other | Admitting: Adult Health

## 2016-03-12 ENCOUNTER — Encounter: Payer: Self-pay | Admitting: Adult Health

## 2016-03-12 DIAGNOSIS — E876 Hypokalemia: Secondary | ICD-10-CM

## 2016-03-12 DIAGNOSIS — E039 Hypothyroidism, unspecified: Secondary | ICD-10-CM

## 2016-03-12 NOTE — Progress Notes (Signed)
Patient ID: Frank Mclean, male   DOB: 02/08/1941, 75 y.o.   MRN: [id number]    DATE:     03/12/16  MRN:  [id number]  BIRTHDAY: 1941-04-17  Facility:  Nursing Home Location:  [facility]  Nursing Home Room Number: 706-P  LEVEL OF CARE:  SNF (31)  Contact Information    Name Relation Home Work Mobile   [place] Spouse [phone]  [phone]   Smith,Brandon Relative [phone]     Andree Coss Daughter   [phone]       Code Status History    Date Active Date Inactive Code Status Order ID Comments User Context   03/03/2016  1:05 AM 03/07/2016  8:47 PM DNR [id number]  Alberteen Sam, MD Inpatient   08/21/2015 11:54 AM 08/26/2015  4:20 PM Full Code [id number]  Alleen Borne, MD Inpatient   03/17/2015  6:46 PM 04/01/2015  2:02 PM Full Code [id number]  Charlton Amor, PA-C Inpatient   03/17/2015  6:46 PM 03/17/2015  6:46 PM Full Code [id number]  Charlton Amor, PA-C Inpatient   03/14/2015  4:55 AM 03/17/2015  6:46 PM Full Code [id number]  Lorretta Harp, MD Inpatient   03/12/2015  7:37 PM 03/13/2015  6:17 PM Full Code [id number]  Marinus Maw, MD Inpatient   03/11/2015  2:53 AM 03/12/2015  7:37 PM Full Code [id number]  Glori Luis, MD ED   02/13/2015 11:08 AM 02/13/2015  3:54 PM Full Code [id number]  Laurey Morale, MD Inpatient   11/26/2013  6:51 AM 11/28/2013  3:23 PM Full Code [id number]  Lynden Oxford, MD Inpatient   06/09/2013  7:53 PM 06/13/2013  5:24 PM Full Code [id number]  Henderson Cloud, MD Inpatient   12/30/2012 12:30 PM 12/31/2012 10:00 PM Full Code [id number]  Hollice Espy, MD Inpatient    Questions for Most Recent Historical Code Status (Order [id number])    Question Answer Comment   In the event of cardiac or respiratory ARREST Do not call a "code blue"    In the event of cardiac or respiratory ARREST Do not perform Intubation, CPR, defibrillation or ACLS    In the event of cardiac or respiratory ARREST Use medication by any route,  position, wound care, and other measures to relive pain and suffering. May use oxygen, suction and manual treatment of airway obstruction as needed for comfort.        Chief Complaint  Patient presents with  . Acute Visit    Hypokalemia and hypothyroidism    HISTORY OF PRESENT ILLNESS:  This is a 75 year old male who was noted to have K 3.2. He is currently not on any K supplementation. No complaints of muscle spasm.  He  has been admitted to [facility] on 03/07/16 from [facility]. He has PMH of dementia, NICM and systolic and diastolic CHF EF 96% with ICD/Pacer, CKD III, NIDDM, hypothyroidism, PAF on Coumadin and Hx of stroke. He had a fall @ home and was noted to be having more confusion. CT of head with no acute finding. He was treated for with Rocephin for UTI and was discharged with oral Augmentin X 5 days. He was treated, also, for hypernatremia with D5W. He takes KCL 30 meq TID @ home which was given in the hospital. K @ discharge was 5.2 so KCL was been stopped.  He has been admitted for a short-term rehabilitation.  PAST MEDICAL HISTORY:  Past Medical History  Diagnosis Date  . Gout   .  HTN (hypertension)   . Hyperlipidemia   . Osteoarthritis   . History of CVA (cerebrovascular accident)     Left pontine infarct July 2004; changed from Plavix to Coumadin in 2004 per MD at [facility]  . GERD (gastroesophageal reflux disease)   . DJD (degenerative joint disease)   . BPH (benign prostatic hypertrophy)   . Peripheral vascular disease (HCC)   . Bilateral leg ulcer (HCC)     ACHILLES AREA--  NONHEALING  . CKD (chronic kidney disease) stage 3, GFR 30-59 ml/min   . CAD (coronary artery disease)     a. LHC 4/12: Mid LAD 25%, mid diagonal 30%, AV circumflex 40%, proximal OM 25%, distal RCA 40%  . Chronic combined systolic and diastolic heart failure (HCC)     a. Echo 05/2013: EF 25-30%, diffuse HK, restrictive physiology, trivial AI, mild MR, moderate LAE, reduced RV systolic  function, PASP 42  . LBBB (left bundle branch block)   . PAF (paroxysmal atrial fibrillation) (HCC)     a. amiodarone Rx started 05/2013;  b. chronic coumadin  . NICM (nonischemic cardiomyopathy) (HCC)     a. EF 25-30%.  [name] Hx of cardiovascular stress test     Nuclear Stress Test (1/16): High risk stress nuclear study with a large, severe, partially reversible inferior and apical defect consistent with prior inferior and apical infarct; mild apical ischemia; severe LVE; study high risk due to reduced LV function.  EF 25% >> reviewed with Dr. Antoine Poche >> medical mgmt  . Hypothyroidism   . DM (diabetes mellitus), type 2 with renal complications (HCC)   . Presence of permanent cardiac pacemaker   . AICD (automatic cardioverter/defibrillator) present   . Stroke (HCC)   . Chronic combined systolic and diastolic CHF (congestive heart failure) (HCC)   . Shortness of breath dyspnea   . Pneumonia   . Chronic depression   . Hypernatremia   . Memory loss   . Anticoagulated on Coumadin   . Anemia in chronic renal disease   . Elevated troponin   . Hyperbilirubinemia   . Physical deconditioning   . Acute cystitis without hematuria   . Hyperkalemia   . Anemia of chronic disease   . Loose stools      CURRENT MEDICATIONS: Reviewed  Patient's Medications  New Prescriptions   No medications on file  Previous Medications   ACETAMINOPHEN (TYLENOL) 325 MG TABLET    Take 1 tablet (325 mg total) by mouth every 6 (six) hours as needed for mild pain (or Fever >/= 101).   AMOXICILLIN-CLAVULANATE (AUGMENTIN) 500-125 MG TABLET    Take 1 tablet (500 mg total) by mouth 2 (two) times daily. Please take for total of 5 days.   ATORVASTATIN (LIPITOR) 20 MG TABLET    Take 1 tablet (20 mg total) by mouth daily.   CARVEDILOL (COREG) 6.25 MG TABLET    Take 1 tablet (6.25 mg total) by mouth 2 (two) times daily with a meal.   CHOLECALCIFEROL (VITAMIN D-3 PO)    Take 1,000 Units by mouth daily.   ESCITALOPRAM  (LEXAPRO) 5 MG TABLET    TAKE 1 TABLET BY MOUTH EVERY DAY   FERROUS SULFATE 325 (65 FE) MG TABLET    Take 1 tablet (325 mg total) by mouth daily.   HYDRALAZINE (APRESOLINE) 25 MG TABLET    Take 1 tablet (25 mg total) by mouth 3 (three) times daily.   HYDROCODONE-ACETAMINOPHEN (NORCO/VICODIN) 5-325 MG TABLET    Take 1 tablet by mouth  every 8 (eight) hours as needed for moderate pain.   ISOSORBIDE MONONITRATE (IMDUR) 30 MG 24 HR TABLET    Take 1 tablet (30 mg total) by mouth 2 (two) times daily.   LEVOTHYROXINE (SYNTHROID, LEVOTHROID) 200 MCG TABLET    Take 1 tablet (200 mcg total) by mouth daily before breakfast.   MENTHOL, TOPICAL ANALGESIC, (BIOFREEZE) 4 % GEL    Apply topically 2 (two) times daily. Apply to left shoulder and left ankle   OXYGEN    Inhale 2.5 L/min into the lungs See admin instructions. USES OXYGEN EVERY BEDTIME USES DURING DAY ONLY AS NEEDED   SYMBICORT 160-4.5 MCG/ACT INHALER    INHALE 2 PUFFS INTO THE LUNGS 2 TIMES DAILY   TAMSULOSIN (FLOMAX) 0.4 MG CAPS CAPSULE    Take 1 capsule (0.4 mg total) by mouth daily.   TORSEMIDE (DEMADEX) 20 MG TABLET    Take 1 tablet (20 mg total) by mouth daily.   TRIAMCINOLONE CREAM (KENALOG) 0.5 %    Apply topically 2 (two) times daily.   VITAMIN B-12 (CYANOCOBALAMIN) 1000 MCG TABLET    Take 1,000 mcg by mouth daily.   WARFARIN (COUMADIN) 5 MG TABLET    TAKE 1 TABLET (5 MG TOTAL) BY MOUTH AS DIRECTED.  Modified Medications   No medications on file  Discontinued Medications   [employer] VITAMIN B12 1000 MCG TABLET    TAKE 1 TABLET BY MOUTH DAILY     Allergies  Allergen Reactions  . Levothyroxine Sodium Other (See Comments)    MUST TAKE BRAND NAME GENERIC DOESN'T WORK FOR PATIENT     REVIEW OF SYSTEMS:  GENERAL: no change in appetite, no fatigue, no weight changes, no fever, chills or weakness EYES: Denies change in vision, dry eyes, eye pain, itching or discharge EARS: Denies change in hearing, ringing in ears, or earache NOSE: Denies  nasal congestion or epistaxis MOUTH and THROAT: Denies oral discomfort, gingival pain or bleeding, pain from teeth or hoarseness   RESPIRATORY: no cough, SOB, DOE, wheezing, hemoptysis CARDIAC: no chest pain, edema or palpitations GI: no abdominal pain, diarrhea, constipation, heart burn, nausea or vomiting GU: Denies dysuria, frequency, hematuria, incontinence, or discharge PSYCHIATRIC: Denies feeling of depression or anxiety. No report of hallucinations, insomnia, paranoia, or agitation   PHYSICAL EXAMINATION  GENERAL APPEARANCE: Well nourished. In no acute distress. Normal body habitus SKIN:  Skin is warm and dry.  HEAD: Normal in size and contour. No evidence of trauma EYES: Lids open and close normally. No blepharitis, entropion or ectropion. PERRL. Conjunctivae are clear and sclerae are white. Lenses are without opacity EARS: Pinnae are normal. Patient hears normal voice tunes of the examiner MOUTH and THROAT: Lips are without lesions. Oral mucosa is moist and without lesions. Tongue is normal in shape, size, and color and without lesions NECK: supple, trachea midline, no neck masses, no thyroid tenderness, no thyromegaly LYMPHATICS: no LAN in the neck, no supraclavicular LAN RESPIRATORY: breathing is even & unlabored, BS CTAB CARDIAC: RRR, no murmur,no extra heart sounds, no edema GI: abdomen soft, normal BS, no masses, no tenderness, no hepatomegaly, no splenomegaly EXTREMITIES:  Able to move X 4 extremities PSYCHIATRIC: Alert and oriented X 3. Affect and behavior are appropriate  LABS/RADIOLOGY: Labs reviewed: Basic Metabolic Panel:  Recent Labs  40/98/11 1107  03/03/16 0513  03/05/16 0429 03/06/16 0538  03/07/16 0535 03/10/16 03/11/16  NA 146*  < > 152*  < > 145 140  < > 140 150* 149*  K 4.5  < >  3.1*  < > 4.2 4.9  --  5.2* 3.3* 3.2*  CL 110  < > 118*  < > 114* 107  --  110  --   --   CO2 28  < > 25  < > 22 21*  --  20*  --   --   GLUCOSE 100*  < > 148*  < > 123*  128*  --  108*  --   --   BUN 18  < > 30*  < > 31* 29*  < > 34* 18 15  CREATININE 1.96*  < > 2.13*  < > 2.46* 2.26*  < > 2.32* 1.8* 1.8*  CALCIUM 9.3  < > 8.9  < > 8.6* 8.8*  --  9.1  --   --   MG 2.1  --  1.9  --  2.0  --   --   --   --   --   < > = values in this interval not displayed. Liver Function Tests:  Recent Labs  08/12/15 1147 03/02/16 2227 03/03/16 0513  AST 15 24 17   ALT 13* 28 23  ALKPHOS 90 124 100  BILITOT 0.7 1.7* 0.9  PROT 5.7* 6.4* 5.5*  ALBUMIN 3.5 3.8 3.3*    Recent Labs  03/13/15 2358  LIPASE 14   CBC:  Recent Labs  03/02/16 2227 03/03/16 0513 03/04/16 0503  03/06/16 0538 03/10/16 03/11/16  WBC 8.1 6.6 8.1  < > 7.8 8.1 8.6  NEUTROABS 7.6  --   --   --   --  6 7  HGB 11.0* 9.6* 9.0*  --  9.7* 9.7* 9.4*  HCT 36.0* 31.5* 30.5*  --  31.9* 33* 32*  MCV 81.6 79.7 83.8  --  81.6  --   --   PLT 158 164 139*  --  146* 256 259  < > = values in this interval not displayed.  Cardiac Enzymes:  Recent Labs  03/02/16 2227 03/03/16 0513 03/04/16 0503  TROPONINI 0.24* 0.44* 0.21*   CBG:  Recent Labs  03/06/16 2111 03/07/16 0721 03/07/16 1138  GLUCAP 104* 101* 111*    Ct Head Wo Contrast  03/04/2016  CLINICAL DATA:  Dementia/ s/p fall. Pt fell on 3-14, unknown LOC, pt denies any pain, on blood thinners, unknown if hit head, hx dementia EXAM: CT HEAD WITHOUT CONTRAST TECHNIQUE: Contiguous axial images were obtained from the base of the skull through the vertex without intravenous contrast. COMPARISON:  03/25/2015 FINDINGS: There is central and cortical atrophy. Periventricular white matter changes are consistent with small vessel disease. There is a small remote lacunar infarct in the right thalamus. Bone windows demonstrate no focal edema or hematoma of the scalp. No calvarial fracture. There atherosclerotic calcification of the internal carotid arteries. IMPRESSION: 1. Atrophy and small vessel disease. 2. Remote right the laminae infarct. 3. No  evidence for acute intracranial abnormality. Electronically Signed   By: Norva Pavlov M.D.   On: 03/04/2016 09:17   Dg Chest Port 1 View  03/02/2016  CLINICAL DATA:  Fall this morning, now with weakness. EXAM: PORTABLE CHEST 1 VIEW COMPARISON:  None. FINDINGS: There is marked cardiomegaly. Left chest wall pacemaker/AICD in place without evidence of wire discontinuity. There is mild central pulmonary vascular congestion without frank alveolar pulmonary edema. Lungs are otherwise clear. Small portion of the right costophrenic angle is excluded on this exam. Osseous structures about the chest are unremarkable. IMPRESSION: 1. Marked cardiomegaly. Mild central pulmonary vascular congestion,  probably chronic, without overt pulmonary edema. 2. Lungs are otherwise clear. No evidence of pneumonia or pleural effusion. This exam was interpreted during a PACS downtime with limited availability of comparison cases. It has been flagged for review following the downtime. If clinically indicated after this review, an addendum will be issued providing details about comparison to prior imaging. Electronically Signed   By: Bary Richard M.D.   On: 03/02/2016 22:23    ASSESSMENT/PLAN:  Hypokalemia - K 3.2; start KCL 20 meq take 1 1/2 tab = 30 meq TID; check BMP on 03/15/16  Hypothyroidism - continue Levothyroxine 200 mcg daily Q D; check tsh     MEDINA-VARGAS,Kaylana Fenstermacher, NP [employer] [phone]

## 2016-03-15 ENCOUNTER — Non-Acute Institutional Stay (SKILLED_NURSING_FACILITY): Payer: Medicare Other | Admitting: Adult Health

## 2016-03-15 ENCOUNTER — Encounter: Payer: Self-pay | Admitting: Adult Health

## 2016-03-15 DIAGNOSIS — E870 Hyperosmolality and hypernatremia: Secondary | ICD-10-CM

## 2016-03-15 DIAGNOSIS — E039 Hypothyroidism, unspecified: Secondary | ICD-10-CM

## 2016-03-15 LAB — BASIC METABOLIC PANEL
BUN: 20 mg/dL (ref 4–21)
Creatinine: 2 mg/dL — AB (ref 0.6–1.3)
Glucose: 123 mg/dL
Potassium: 4.5 mmol/L (ref 3.4–5.3)
Sodium: 151 mmol/L — AB (ref 137–147)

## 2016-03-15 LAB — TSH: TSH: 0.15 u[IU]/mL — AB (ref 0.41–5.90)

## 2016-03-15 NOTE — Progress Notes (Signed)
Patient ID: Frank Mclean, male   DOB: 1941/11/29, 75 y.o.   MRN: [id number]    DATE:     03/15/16  MRN:  [id number]  BIRTHDAY: September 30, 1941  Facility:  Nursing Home Location:  [facility]  Nursing Home Room Number: 706-P  LEVEL OF CARE:  SNF (31)  Contact Information    Name Relation Home Work Mobile   [place] Spouse [phone]  [phone]   Smith,Brandon Relative [phone]     Andree Coss Daughter   [phone]       Code Status History    Date Active Date Inactive Code Status Order ID Comments User Context   03/03/2016  1:05 AM 03/07/2016  8:47 PM DNR [id number]  Alberteen Sam, MD Inpatient   08/21/2015 11:54 AM 08/26/2015  4:20 PM Full Code [id number]  Alleen Borne, MD Inpatient   03/17/2015  6:46 PM 04/01/2015  2:02 PM Full Code [id number]  Charlton Amor, PA-C Inpatient   03/17/2015  6:46 PM 03/17/2015  6:46 PM Full Code [id number]  Charlton Amor, PA-C Inpatient   03/14/2015  4:55 AM 03/17/2015  6:46 PM Full Code [id number]  Lorretta Harp, MD Inpatient   03/12/2015  7:37 PM 03/13/2015  6:17 PM Full Code [id number]  Marinus Maw, MD Inpatient   03/11/2015  2:53 AM 03/12/2015  7:37 PM Full Code [id number]  Glori Luis, MD ED   02/13/2015 11:08 AM 02/13/2015  3:54 PM Full Code [id number]  Laurey Morale, MD Inpatient   11/26/2013  6:51 AM 11/28/2013  3:23 PM Full Code [id number]  Lynden Oxford, MD Inpatient   06/09/2013  7:53 PM 06/13/2013  5:24 PM Full Code [id number]  Henderson Cloud, MD Inpatient   12/30/2012 12:30 PM 12/31/2012 10:00 PM Full Code [id number]  Hollice Espy, MD Inpatient    Questions for Most Recent Historical Code Status (Order [id number])    Question Answer Comment   In the event of cardiac or respiratory ARREST Do not call a "code blue"    In the event of cardiac or respiratory ARREST Do not perform Intubation, CPR, defibrillation or ACLS    In the event of cardiac or respiratory ARREST Use medication by any route,  position, wound care, and other measures to relive pain and suffering. May use oxygen, suction and manual treatment of airway obstruction as needed for comfort.        Chief Complaint  Patient presents with  . Acute Visit    Hypernatremia, hypothyroidism    HISTORY OF PRESENT ILLNESS:  This is a 75 year old male who was noted to have NA 151 and tsh 0.146. He is currently taking Synthroid 200 mcg daily for hypothyroidism. No complaints of palpitation nor diarrhea.  He  has been admitted to [facility] on 03/07/16 from [facility]. He has PMH of dementia, NICM and systolic and diastolic CHF EF 14% with ICD/Pacer, CKD III, NIDDM, hypothyroidism, PAF on Coumadin and Hx of stroke. He had a fall @ home and was noted to be having more confusion. CT of head with no acute finding. He was treated for with Rocephin for UTI and was discharged with oral Augmentin X 5 days. He was treated, also, for hypernatremia with D5W. He takes KCL 30 meq TID @ home which was given in the hospital. K @ discharge was 5.2 so KCL was been stopped.  He has been admitted for a short-term rehabilitation.  PAST MEDICAL HISTORY:  Past Medical History  Diagnosis Date  . Gout   . HTN (hypertension)   . Hyperlipidemia   . Osteoarthritis   . History of CVA (cerebrovascular accident)     Left pontine infarct July 2004; changed from Plavix to Coumadin in 2004 per MD at [facility]  . GERD (gastroesophageal reflux disease)   . DJD (degenerative joint disease)   . BPH (benign prostatic hypertrophy)   . Peripheral vascular disease (HCC)   . Bilateral leg ulcer (HCC)     ACHILLES AREA--  NONHEALING  . CKD (chronic kidney disease) stage 3, GFR 30-59 ml/min   . CAD (coronary artery disease)     a. LHC 4/12: Mid LAD 25%, mid diagonal 30%, AV circumflex 40%, proximal OM 25%, distal RCA 40%  . Chronic combined systolic and diastolic heart failure (HCC)     a. Echo 05/2013: EF 25-30%, diffuse HK, restrictive physiology, trivial  AI, mild MR, moderate LAE, reduced RV systolic function, PASP 42  . LBBB (left bundle branch block)   . PAF (paroxysmal atrial fibrillation) (HCC)     a. amiodarone Rx started 05/2013;  b. chronic coumadin  . NICM (nonischemic cardiomyopathy) (HCC)     a. EF 25-30%.  [name] Hx of cardiovascular stress test     Nuclear Stress Test (1/16): High risk stress nuclear study with a large, severe, partially reversible inferior and apical defect consistent with prior inferior and apical infarct; mild apical ischemia; severe LVE; study high risk due to reduced LV function.  EF 25% >> reviewed with Dr. Antoine Poche >> medical mgmt  . Hypothyroidism   . DM (diabetes mellitus), type 2 with renal complications (HCC)   . Presence of permanent cardiac pacemaker   . AICD (automatic cardioverter/defibrillator) present   . Stroke (HCC)   . Chronic combined systolic and diastolic CHF (congestive heart failure) (HCC)   . Shortness of breath dyspnea   . Pneumonia   . Chronic depression   . Hypernatremia   . Memory loss   . Anticoagulated on Coumadin   . Anemia in chronic renal disease   . Elevated troponin   . Hyperbilirubinemia   . Physical deconditioning   . Acute cystitis without hematuria   . Hypokalemia   . Anemia of chronic disease   . Loose stools   . Hyperkalemia      CURRENT MEDICATIONS: Reviewed  Patient's Medications  New Prescriptions   No medications on file  Previous Medications   ACETAMINOPHEN (TYLENOL) 325 MG TABLET    Take 1 tablet (325 mg total) by mouth every 6 (six) hours as needed for mild pain (or Fever >/= 101).   ATORVASTATIN (LIPITOR) 20 MG TABLET    Take 1 tablet (20 mg total) by mouth daily.   CARVEDILOL (COREG) 6.25 MG TABLET    Take 1 tablet (6.25 mg total) by mouth 2 (two) times daily with a meal.   CHOLECALCIFEROL (VITAMIN D-3 PO)    Take 1,000 Units by mouth daily.   ESCITALOPRAM (LEXAPRO) 5 MG TABLET    TAKE 1 TABLET BY MOUTH EVERY DAY   FERROUS SULFATE 325 (65 FE) MG  TABLET    Take 1 tablet (325 mg total) by mouth daily.   HYDRALAZINE (APRESOLINE) 25 MG TABLET    Take 1 tablet (25 mg total) by mouth 3 (three) times daily.   HYDROCODONE-ACETAMINOPHEN (NORCO/VICODIN) 5-325 MG TABLET    Take 1 tablet by mouth every 8 (eight) hours as needed for moderate pain.   ISOSORBIDE MONONITRATE (IMDUR) 30 MG 24  HR TABLET    Take 1 tablet (30 mg total) by mouth 2 (two) times daily.   LEVOTHYROXINE (SYNTHROID, LEVOTHROID) 175 MCG TABLET    Take 175 mcg by mouth daily before breakfast.   MENTHOL, TOPICAL ANALGESIC, (BIOFREEZE) 4 % GEL    Apply topically 2 (two) times daily. Apply to left shoulder and left ankle   OXYGEN    Inhale 2.5 L/min into the lungs See admin instructions. USES OXYGEN EVERY BEDTIME USES DURING DAY ONLY AS NEEDED   POTASSIUM CHLORIDE SA (K-DUR,KLOR-CON) 20 MEQ TABLET    Take 30 mEq by mouth 3 (three) times daily. Take 1-1/2 tablets to = 30 mEq po TID   PREDNISONE (DELTASONE) 20 MG TABLET    Take 40 mg by mouth daily with breakfast.   SYMBICORT 160-4.5 MCG/ACT INHALER    INHALE 2 PUFFS INTO THE LUNGS 2 TIMES DAILY   TAMSULOSIN (FLOMAX) 0.4 MG CAPS CAPSULE    Take 1 capsule (0.4 mg total) by mouth daily.   TORSEMIDE (DEMADEX) 10 MG TABLET    Take 10 mg by mouth daily.   TRIAMCINOLONE CREAM (KENALOG) 0.5 %    Apply topically 2 (two) times daily.   VITAMIN B-12 (CYANOCOBALAMIN) 1000 MCG TABLET    Take 1,000 mcg by mouth daily.  Modified Medications   No medications on file  Discontinued Medications   AMOXICILLIN-CLAVULANATE (AUGMENTIN) 500-125 MG TABLET    Take 1 tablet (500 mg total) by mouth 2 (two) times daily. Please take for total of 5 days.   LEVOTHYROXINE (SYNTHROID, LEVOTHROID) 200 MCG TABLET    Take 1 tablet (200 mcg total) by mouth daily before breakfast.   SODIUM CHLORIDE 0.45 % SOLUTION    Inject 70 mLs into the vein continuous. For one liter due to hypernatremia   TORSEMIDE (DEMADEX) 20 MG TABLET    Take 1 tablet (20 mg total) by mouth daily.    WARFARIN (COUMADIN) 5 MG TABLET    TAKE 1 TABLET (5 MG TOTAL) BY MOUTH AS DIRECTED.     Allergies  Allergen Reactions  . Levothyroxine Sodium Other (See Comments)    MUST TAKE BRAND NAME GENERIC DOESN'T WORK FOR PATIENT     REVIEW OF SYSTEMS:  GENERAL: no change in appetite, no fatigue, no weight changes, no fever, chills or weakness EYES: Denies change in vision, dry eyes, eye pain, itching or discharge EARS: Denies change in hearing, ringing in ears, or earache NOSE: Denies nasal congestion or epistaxis MOUTH and THROAT: Denies oral discomfort, gingival pain or bleeding, pain from teeth or hoarseness   RESPIRATORY: no cough, SOB, DOE, wheezing, hemoptysis CARDIAC: no chest pain, edema or palpitations GI: no abdominal pain, diarrhea, constipation, heart burn, nausea or vomiting GU: Denies dysuria, frequency, hematuria, incontinence, or discharge PSYCHIATRIC: Denies feeling of depression or anxiety. No report of hallucinations, insomnia, paranoia, or agitation   PHYSICAL EXAMINATION  GENERAL APPEARANCE: Well nourished. In no acute distress. Normal body habitus SKIN:  Skin is warm and dry.  HEAD: Normal in size and contour. No evidence of trauma EYES: Lids open and close normally. No blepharitis, entropion or ectropion. PERRL. Conjunctivae are clear and sclerae are white. Lenses are without opacity EARS: Pinnae are normal. Patient hears normal voice tunes of the examiner MOUTH and THROAT: Lips are without lesions. Oral mucosa is moist and without lesions. Tongue is normal in shape, size, and color and without lesions NECK: supple, trachea midline, no neck masses, no thyroid tenderness, no thyromegaly LYMPHATICS: no LAN in  the neck, no supraclavicular LAN RESPIRATORY: breathing is even & unlabored, BS CTAB CARDIAC: RRR, no murmur,no extra heart sounds, no edema GI: abdomen soft, normal BS, no masses, no tenderness, no hepatomegaly, no splenomegaly EXTREMITIES:  Able to move X 4  extremities PSYCHIATRIC: Alert and oriented X 3. Affect and behavior are appropriate  LABS/RADIOLOGY: Labs reviewed: Basic Metabolic Panel:  Recent Labs  16/10/96 1107  03/03/16 0513  03/05/16 0429 03/06/16 0538  03/07/16 0535  03/11/16 03/15/16   NA 146*  < > 152*  < > 145 140  < > 140  < > 149* 151*   K 4.5  < > 3.1*  < > 4.2 4.9  --  5.2*  < > 3.2* 4.5   CL 110  < > 118*  < > 114* 107  --  110  --   --   --    CO2 28  < > 25  < > 22 21*  --  20*  --   --   --    GLUCOSE 100*  < > 148*  < > 123* 128*  --  108*  --   --   --    BUN 18  < > 30*  < > 31* 29*  < > 34*  < > 15 20   CREATININE 1.96*  < > 2.13*  < > 2.46* 2.26*  < > 2.32*  < > 1.8* 2.0*   CALCIUM 9.3  < > 8.9  < > 8.6* 8.8*  --  9.1  --   --   --    MG 2.1  --  1.9  --  2.0  --   --   --   --   --   --    < > = values in this interval not displayed. Liver Function Tests:  Recent Labs  08/12/15 1147 03/02/16 2227 03/03/16 0513  AST 15 24 17   ALT 13* 28 23  ALKPHOS 90 124 100  BILITOT 0.7 1.7* 0.9  PROT 5.7* 6.4* 5.5*  ALBUMIN 3.5 3.8 3.3*   No results for input(s): LIPASE, AMYLASE in the last 8760 hours. CBC:  Recent Labs  03/02/16 2227 03/03/16 0513 03/04/16 0503  03/06/16 0538 03/10/16 03/11/16  WBC 8.1 6.6 8.1  < > 7.8 8.1 8.6  NEUTROABS 7.6  --   --   --   --  6 7  HGB 11.0* 9.6* 9.0*  --  9.7* 9.7* 9.4*  HCT 36.0* 31.5* 30.5*  --  31.9* 33* 32*  MCV 81.6 79.7 83.8  --  81.6  --   --   PLT 158 164 139*  --  146* 256 259  < > = values in this interval not displayed.  Cardiac Enzymes:  Recent Labs  03/02/16 2227 03/03/16 0513 03/04/16 0503  TROPONINI 0.24* 0.44* 0.21*   CBG:  Recent Labs  03/06/16 2111 03/07/16 0721 03/07/16 1138  GLUCAP 104* 101* 111*    Ct Head Wo Contrast  03/04/2016  CLINICAL DATA:  Dementia/ s/p fall. Pt fell on 3-14, unknown LOC, pt denies any pain, on blood thinners, unknown if hit head, hx dementia EXAM: CT HEAD WITHOUT CONTRAST TECHNIQUE: Contiguous  axial images were obtained from the base of the skull through the vertex without intravenous contrast. COMPARISON:  03/25/2015 FINDINGS: There is central and cortical atrophy. Periventricular white matter changes are consistent with small vessel disease. There is a small remote lacunar infarct in the right thalamus. Bone  windows demonstrate no focal edema or hematoma of the scalp. No calvarial fracture. There atherosclerotic calcification of the internal carotid arteries. IMPRESSION: 1. Atrophy and small vessel disease. 2. Remote right the laminae infarct. 3. No evidence for acute intracranial abnormality. Electronically Signed   By: Norva Pavlov M.D.   On: 03/04/2016 09:17   Dg Chest Port 1 View  03/02/2016  CLINICAL DATA:  Fall this morning, now with weakness. EXAM: PORTABLE CHEST 1 VIEW COMPARISON:  None. FINDINGS: There is marked cardiomegaly. Left chest wall pacemaker/AICD in place without evidence of wire discontinuity. There is mild central pulmonary vascular congestion without frank alveolar pulmonary edema. Lungs are otherwise clear. Small portion of the right costophrenic angle is excluded on this exam. Osseous structures about the chest are unremarkable. IMPRESSION: 1. Marked cardiomegaly. Mild central pulmonary vascular congestion, probably chronic, without overt pulmonary edema. 2. Lungs are otherwise clear. No evidence of pneumonia or pleural effusion. This exam was interpreted during a PACS downtime with limited availability of comparison cases. It has been flagged for review following the downtime. If clinically indicated after this review, an addendum will be issued providing details about comparison to prior imaging. Electronically Signed   By: Bary Richard M.D.   On: 03/02/2016 22:23    ASSESSMENT/PLAN:  Hypernatremia -  NA 151; give 1/2 NS @ 70 ml/hr X 1L; BMP next lab draw  Hypothyroidism - decrease Synthroid to 175 mcg daily Q D; check tsh in 6 weeks     MEDINA-VARGAS,Mikaylah Libbey,  NP [facility] [phone]

## 2016-03-16 ENCOUNTER — Encounter: Payer: Self-pay | Admitting: Adult Health

## 2016-03-16 ENCOUNTER — Non-Acute Institutional Stay (SKILLED_NURSING_FACILITY): Payer: Medicare Other | Admitting: Adult Health

## 2016-03-16 DIAGNOSIS — Z7901 Long term (current) use of anticoagulants: Secondary | ICD-10-CM | POA: Diagnosis not present

## 2016-03-16 DIAGNOSIS — I480 Paroxysmal atrial fibrillation: Secondary | ICD-10-CM

## 2016-03-16 LAB — BASIC METABOLIC PANEL
BUN: 29 mg/dL — AB (ref 4–21)
CREATININE: 2.2 mg/dL — AB (ref 0.6–1.3)
GLUCOSE: 146 mg/dL
Potassium: 4.4 mmol/L (ref 3.4–5.3)
Sodium: 141 mmol/L (ref 137–147)

## 2016-03-16 NOTE — Progress Notes (Signed)
Patient ID: Frank Mclean, male   DOB: January 02, 1941, 75 y.o.   MRN: [id number] Subjective:     Indication: atrial fibrillation Bleeding signs/symptoms: None Thromboembolic signs/symptoms: None  Missed Coumadin doses: None Medication changes: no Dietary changes: no Bacterial/viral infection: no Other concerns: no  The following portions of the patient's history were reviewed and updated as appropriate: allergies, current medications, past family history, past medical history, past social history, past surgical history and problem list.  Review of Systems A comprehensive review of systems was negative.   Objective:    INR Today: 7.0 Current dose: Coumadin 5 mg daily     Assessment:    Supratherapeutic INR for goal of 2-3   Plan:    1. New dose: Hold Coumadin X 1 day and Vitamin K 5 mg daily   2. Next INR:   03/17/16

## 2016-03-17 ENCOUNTER — Encounter: Payer: Self-pay | Admitting: Cardiology

## 2016-03-17 ENCOUNTER — Non-Acute Institutional Stay (SKILLED_NURSING_FACILITY): Payer: Medicare Other | Admitting: Adult Health

## 2016-03-17 ENCOUNTER — Encounter: Payer: Self-pay | Admitting: Adult Health

## 2016-03-17 DIAGNOSIS — I480 Paroxysmal atrial fibrillation: Secondary | ICD-10-CM

## 2016-03-17 DIAGNOSIS — Z7901 Long term (current) use of anticoagulants: Secondary | ICD-10-CM

## 2016-03-17 LAB — BASIC METABOLIC PANEL
BUN: 35 mg/dL — AB (ref 4–21)
CREATININE: 2.2 mg/dL — AB (ref 0.6–1.3)
Glucose: 166 mg/dL
Potassium: 4.8 mmol/L (ref 3.4–5.3)
Sodium: 144 mmol/L (ref 137–147)

## 2016-03-17 NOTE — Progress Notes (Signed)
Patient ID: Frank Mclean, male   DOB: April 13, 1941, 75 y.o.   MRN: [id number] Subjective:     Indication: atrial fibrillation Bleeding signs/symptoms: None Thromboembolic signs/symptoms: None  Missed Coumadin doses: This week - 1 Medication changes: no Dietary changes: no Bacterial/viral infection: no Other concerns: no  The following portions of the patient's history were reviewed and updated as appropriate: allergies, current medications, past family history, past medical history, past social history, past surgical history and problem list.  Review of Systems A comprehensive review of systems was negative.   Objective:    INR Today: 2.3 Current dose: Coumadin 5 mg daily     Assessment:    Therapeutic INR for goal of 2-3   Plan:    1. New dose: decrease Coumadin to 5 mg daily   2. Next INR: 03/19/16

## 2016-03-19 ENCOUNTER — Other Ambulatory Visit: Payer: Self-pay | Admitting: *Deleted

## 2016-03-19 LAB — HEPATIC FUNCTION PANEL
ALK PHOS: 142 U/L — AB (ref 25–125)
ALT: 96 U/L — AB (ref 10–40)
AST: 60 U/L — AB (ref 14–40)
BILIRUBIN, TOTAL: 0.6 mg/dL

## 2016-03-19 LAB — LIPID PANEL
CHOLESTEROL: 97 mg/dL (ref 0–200)
HDL: 25 mg/dL — AB (ref 35–70)
LDL Cholesterol: 52 mg/dL
Triglycerides: 101 mg/dL (ref 40–160)

## 2016-03-19 MED ORDER — HYDROCODONE-ACETAMINOPHEN 5-325 MG PO TABS: 1.0000 | ORAL_TABLET | Freq: Three times a day (TID) | ORAL | Status: DC | PRN

## 2016-03-19 NOTE — Telephone Encounter (Signed)
[facility] 

## 2016-03-23 LAB — BASIC METABOLIC PANEL
BUN: 39 mg/dL — AB (ref 4–21)
Creatinine: 2.4 mg/dL — AB (ref 0.6–1.3)
Glucose: 126 mg/dL
POTASSIUM: 5.4 mmol/L — AB (ref 3.4–5.3)
SODIUM: 146 mmol/L (ref 137–147)

## 2016-03-24 ENCOUNTER — Non-Acute Institutional Stay (SKILLED_NURSING_FACILITY): Payer: Medicare Other | Admitting: Adult Health

## 2016-03-24 ENCOUNTER — Encounter: Payer: Self-pay | Admitting: Adult Health

## 2016-03-24 ENCOUNTER — Ambulatory Visit: Payer: Medicare Other | Admitting: Podiatry

## 2016-03-24 DIAGNOSIS — E875 Hyperkalemia: Secondary | ICD-10-CM

## 2016-03-24 NOTE — Progress Notes (Signed)
Patient ID: Frank Mclean, male   DOB: 1941-09-21, 75 y.o.   MRN: [id number]    DATE:     03/24/16  MRN:  [id number]  BIRTHDAY: 08/09/1941  Facility:  Nursing Home Location:  [facility]  Nursing Home Room Number: 706-P  LEVEL OF CARE:  SNF (31)  Contact Information    Name Relation Home Work Mobile   Kistler Spouse [phone]  [phone]   Smith,Brandon Relative [phone]     Andree Coss Daughter   [phone]       Code Status History    Date Active Date Inactive Code Status Order ID Comments User Context   03/03/2016  1:05 AM 03/07/2016  8:47 PM DNR [id number]  Alberteen Sam, MD Inpatient   08/21/2015 11:54 AM 08/26/2015  4:20 PM Full Code [id number]  Alleen Borne, MD Inpatient   03/17/2015  6:46 PM 04/01/2015  2:02 PM Full Code [id number]  Charlton Amor, PA-C Inpatient   03/17/2015  6:46 PM 03/17/2015  6:46 PM Full Code [id number]  Charlton Amor, PA-C Inpatient   03/14/2015  4:55 AM 03/17/2015  6:46 PM Full Code [id number]  Lorretta Harp, MD Inpatient   03/12/2015  7:37 PM 03/13/2015  6:17 PM Full Code [id number]  Marinus Maw, MD Inpatient   03/11/2015  2:53 AM 03/12/2015  7:37 PM Full Code [id number]  Glori Luis, MD ED   02/13/2015 11:08 AM 02/13/2015  3:54 PM Full Code [id number]  Laurey Morale, MD Inpatient   11/26/2013  6:51 AM 11/28/2013  3:23 PM Full Code [id number]  Lynden Oxford, MD Inpatient   06/09/2013  7:53 PM 06/13/2013  5:24 PM Full Code [id number]  Henderson Cloud, MD Inpatient   12/30/2012 12:30 PM 12/31/2012 10:00 PM Full Code [id number]  Hollice Espy, MD Inpatient    Questions for Most Recent Historical Code Status (Order [id number])    Question Answer Comment   In the event of cardiac or respiratory ARREST Do not call a "code blue"    In the event of cardiac or respiratory ARREST Do not perform Intubation, CPR, defibrillation or ACLS    In the event of cardiac or respiratory ARREST Use medication by any route,  position, wound care, and other measures to relive pain and suffering. May use oxygen, suction and manual treatment of airway obstruction as needed for comfort.        Chief Complaint  Patient presents with  . Acute Visit    Hyperkalemia    HISTORY OF PRESENT ILLNESS:  This is a 75 year old male who was noted to have K 5.4. No complaints of muscle spasm. He is currently on K supplementation due to chronic hypokalemia.   He  has been admitted to [facility] on 03/07/16 from [facility] He has PMH of dementia, NICM and systolic and diastolic CHF EF 96% with ICD/Pacer, CKD III, NIDDM, hypothyroidism, PAF on Coumadin and Hx of stroke. He had a fall @ home and was noted to be having more confusion. CT of head with no acute finding. He was treated for with Rocephin for UTI and was discharged with oral Augmentin X 5 days. He was treated, also, for hypernatremia with D5W. He takes KCL 30 meq TID @ home which was given in the hospital. K @ discharge was 5.2 so KCL was been stopped.  He has been admitted for a short-term rehabilitation.  PAST MEDICAL HISTORY:  Past Medical History  Diagnosis Date  .  Gout   . HTN (hypertension)   . Hyperlipidemia   . Osteoarthritis   . History of CVA (cerebrovascular accident)     Left pontine infarct July 2004; changed from Plavix to Coumadin in 2004 per MD at [facility]  . GERD (gastroesophageal reflux disease)   . DJD (degenerative joint disease)   . BPH (benign prostatic hypertrophy)   . Peripheral vascular disease (HCC)   . Bilateral leg ulcer (HCC)     ACHILLES AREA--  NONHEALING  . CKD (chronic kidney disease) stage 3, GFR 30-59 ml/min   . CAD (coronary artery disease)     a. LHC 4/12: Mid LAD 25%, mid diagonal 30%, AV circumflex 40%, proximal OM 25%, distal RCA 40%  . Chronic combined systolic and diastolic heart failure (HCC)     a. Echo 05/2013: EF 25-30%, diffuse HK, restrictive physiology, trivial AI, mild MR, moderate LAE, reduced RV  systolic function, PASP 42  . LBBB (left bundle branch block)   . PAF (paroxysmal atrial fibrillation) (HCC)     a. amiodarone Rx started 05/2013;  b. chronic coumadin  . NICM (nonischemic cardiomyopathy) (HCC)     a. EF 25-30%.  [name] Hx of cardiovascular stress test     Nuclear Stress Test (1/16): High risk stress nuclear study with a large, severe, partially reversible inferior and apical defect consistent with prior inferior and apical infarct; mild apical ischemia; severe LVE; study high risk due to reduced LV function.  EF 25% >> reviewed with Dr. Antoine Poche >> medical mgmt  . Hypothyroidism   . DM (diabetes mellitus), type 2 with renal complications (HCC)   . Presence of permanent cardiac pacemaker   . AICD (automatic cardioverter/defibrillator) present   . Stroke (HCC)   . Chronic combined systolic and diastolic CHF (congestive heart failure) (HCC)   . Shortness of breath dyspnea   . Pneumonia   . Chronic depression   . Hypernatremia   . Memory loss   . Anticoagulated on Coumadin   . Anemia in chronic renal disease   . Elevated troponin   . Hyperbilirubinemia   . Physical deconditioning   . Acute cystitis without hematuria   . Hypokalemia   . Anemia of chronic disease   . Loose stools   . Hyperkalemia   . Hypokalemia      CURRENT MEDICATIONS: Reviewed  Patient's Medications  New Prescriptions   No medications on file  Previous Medications   ACETAMINOPHEN (TYLENOL) 325 MG TABLET    Take 1 tablet (325 mg total) by mouth every 6 (six) hours as needed for mild pain (or Fever >/= 101).   ATORVASTATIN (LIPITOR) 20 MG TABLET    Take 1 tablet (20 mg total) by mouth daily.   CARVEDILOL (COREG) 6.25 MG TABLET    Take 1 tablet (6.25 mg total) by mouth 2 (two) times daily with a meal.   CHOLECALCIFEROL (VITAMIN D-3 PO)    Take 1,000 Units by mouth daily.   ESCITALOPRAM (LEXAPRO) 5 MG TABLET    TAKE 1 TABLET BY MOUTH EVERY DAY   FERROUS SULFATE 325 (65 FE) MG TABLET    Take 1 tablet  (325 mg total) by mouth daily.   HYDRALAZINE (APRESOLINE) 25 MG TABLET    Take 1 tablet (25 mg total) by mouth 3 (three) times daily.   HYDROCODONE-ACETAMINOPHEN (NORCO/VICODIN) 5-325 MG TABLET    Take 1 tablet by mouth every 8 (eight) hours as needed for moderate pain. Do not exceed 4gm of Tylenol in 24  hours   ISOSORBIDE MONONITRATE (IMDUR) 30 MG 24 HR TABLET    Take 1 tablet (30 mg total) by mouth 2 (two) times daily.   LEVOTHYROXINE (SYNTHROID, LEVOTHROID) 175 MCG TABLET    Take 175 mcg by mouth daily before breakfast.   MENTHOL, TOPICAL ANALGESIC, (BIOFREEZE) 4 % GEL    Apply topically 2 (two) times daily. Apply to left shoulder and left ankle   OXYGEN    Inhale 2.5 L/min into the lungs See admin instructions. USES OXYGEN EVERY BEDTIME USES DURING DAY ONLY AS NEEDED   POTASSIUM CHLORIDE SA (K-DUR,KLOR-CON) 20 MEQ TABLET    Take 20 mEq by mouth 3 (three) times daily. Hold for 2 days and then resume at 20 mEq po QD   SYMBICORT 160-4.5 MCG/ACT INHALER    INHALE 2 PUFFS INTO THE LUNGS 2 TIMES DAILY   TAMSULOSIN (FLOMAX) 0.4 MG CAPS CAPSULE    Take 1 capsule (0.4 mg total) by mouth daily.   TORSEMIDE (DEMADEX) 10 MG TABLET    Take 10 mg by mouth daily.   TRIAMCINOLONE CREAM (KENALOG) 0.5 %    Apply topically 2 (two) times daily.   VITAMIN B-12 (CYANOCOBALAMIN) 1000 MCG TABLET    Take 1,000 mcg by mouth daily.   WARFARIN (COUMADIN) 2.5 MG TABLET    Take 2.5 mg by mouth daily.  Modified Medications   No medications on file  Discontinued Medications   No medications on file     Allergies  Allergen Reactions  . Levothyroxine Sodium Other (See Comments)    MUST TAKE BRAND NAME GENERIC DOESN'T WORK FOR PATIENT     REVIEW OF SYSTEMS:  GENERAL: no change in appetite, no fatigue, no weight changes, no fever, chills or weakness EYES: Denies change in vision, dry eyes, eye pain, itching or discharge EARS: Denies change in hearing, ringing in ears, or earache NOSE: Denies nasal congestion or  epistaxis MOUTH and THROAT: Denies oral discomfort, gingival pain or bleeding, pain from teeth or hoarseness   RESPIRATORY: no cough, SOB, DOE, wheezing, hemoptysis CARDIAC: no chest pain, edema or palpitations GI: no abdominal pain, diarrhea, constipation, heart burn, nausea or vomiting GU: Denies dysuria, frequency, hematuria, incontinence, or discharge PSYCHIATRIC: Denies feeling of depression or anxiety. No report of hallucinations, insomnia, paranoia, or agitation   PHYSICAL EXAMINATION  GENERAL APPEARANCE: Well nourished. In no acute distress. Normal body habitus SKIN:  Skin is warm and dry.  HEAD: Normal in size and contour. No evidence of trauma EYES: Lids open and close normally. No blepharitis, entropion or ectropion. PERRL. Conjunctivae are clear and sclerae are white. Lenses are without opacity EARS: Pinnae are normal. Patient hears normal voice tunes of the examiner MOUTH and THROAT: Lips are without lesions. Oral mucosa is moist and without lesions. Tongue is normal in shape, size, and color and without lesions NECK: supple, trachea midline, no neck masses, no thyroid tenderness, no thyromegaly LYMPHATICS: no LAN in the neck, no supraclavicular LAN RESPIRATORY: breathing is even & unlabored, BS CTAB; on O2 @ 2L/min via [place] CARDIAC: RRR, no murmur,no extra heart sounds, LLE edema 2+ , left chest pacemaker GI: abdomen soft, normal BS, no masses, no tenderness, no hepatomegaly, no splenomegaly EXTREMITIES:  Able to move X 4 extremities PSYCHIATRIC: Alert and oriented X 3. Affect and behavior are appropriate  LABS/RADIOLOGY:  Labs reviewed: BMP Latest Ref Rng 03/23/2016 03/17/2016 03/16/2016  Glucose 65 - 99 mg/dL - - -  BUN 4 - 21 mg/dL 16(X) 09(U) 04(V)  Creatinine  0.6 - 1.3 mg/dL 2.4(A) 2.2(A) 2.2(A)  Sodium 137 - 147 mmol/L 146 144 141  Potassium 3.4 - 5.3 mmol/L 5.4(A) 4.8 4.4  Chloride 101 - 111 mmol/L - - -  CO2 22 - 32 mmol/L - - -  Calcium 8.9 - 10.3 mg/dL - - -    Liver Function Tests:  Recent Labs  08/12/15 1147 03/02/16 2227 03/03/16 0513 03/19/16  AST 15 24 17  60*  ALT 13* 28 23 96*  ALKPHOS 90 124 100 142*  BILITOT 0.7 1.7* 0.9  --   PROT 5.7* 6.4* 5.5*  --   ALBUMIN 3.5 3.8 3.3*  --    CBC:  Recent Labs  03/02/16 2227 03/03/16 0513 03/04/16 0503  03/06/16 0538 03/10/16 03/11/16  WBC 8.1 6.6 8.1  < > 7.8 8.1 8.6  NEUTROABS 7.6  --   --   --   --  6 7  HGB 11.0* 9.6* 9.0*  --  9.7* 9.7* 9.4*  HCT 36.0* 31.5* 30.5*  --  31.9* 33* 32*  MCV 81.6 79.7 83.8  --  81.6  --   --   PLT 158 164 139*  --  146* 256 259  < > = values in this interval not displayed.  Cardiac Enzymes:  Recent Labs  03/02/16 2227 03/03/16 0513 03/04/16 0503  TROPONINI 0.24* 0.44* 0.21*   CBG:  Recent Labs  03/06/16 2111 03/07/16 0721 03/07/16 1138  GLUCAP 104* 101* 111*    Ct Head Wo Contrast  03/04/2016  CLINICAL DATA:  Dementia/ s/p fall. Pt fell on 3-14, unknown LOC, pt denies any pain, on blood thinners, unknown if hit head, hx dementia EXAM: CT HEAD WITHOUT CONTRAST TECHNIQUE: Contiguous axial images were obtained from the base of the skull through the vertex without intravenous contrast. COMPARISON:  03/25/2015 FINDINGS: There is central and cortical atrophy. Periventricular white matter changes are consistent with small vessel disease. There is a small remote lacunar infarct in the right thalamus. Bone windows demonstrate no focal edema or hematoma of the scalp. No calvarial fracture. There atherosclerotic calcification of the internal carotid arteries. IMPRESSION: 1. Atrophy and small vessel disease. 2. Remote right the laminae infarct. 3. No evidence for acute intracranial abnormality. Electronically Signed   By: Norva Pavlov M.D.   On: 03/04/2016 09:17   Dg Chest Port 1 View  03/02/2016  CLINICAL DATA:  Fall this morning, now with weakness. EXAM: PORTABLE CHEST 1 VIEW COMPARISON:  None. FINDINGS: There is marked cardiomegaly. Left  chest wall pacemaker/AICD in place without evidence of wire discontinuity. There is mild central pulmonary vascular congestion without frank alveolar pulmonary edema. Lungs are otherwise clear. Small portion of the right costophrenic angle is excluded on this exam. Osseous structures about the chest are unremarkable. IMPRESSION: 1. Marked cardiomegaly. Mild central pulmonary vascular congestion, probably chronic, without overt pulmonary edema. 2. Lungs are otherwise clear. No evidence of pneumonia or pleural effusion. This exam was interpreted during a PACS downtime with limited availability of comparison cases. It has been flagged for review following the downtime. If clinically indicated after this review, an addendum will be issued providing details about comparison to prior imaging. Electronically Signed   By: Bary Richard M.D.   On: 03/02/2016 22:23    ASSESSMENT/PLAN:  Hyperkalemia - K 5.4; hold KCL X 2 days then start KCL 20 meq 1 tab PO Q D; BMP on 03/31/16    [facility], NP [employer] [phone]

## 2016-03-30 ENCOUNTER — Encounter: Payer: Self-pay | Admitting: Adult Health

## 2016-03-30 ENCOUNTER — Non-Acute Institutional Stay (SKILLED_NURSING_FACILITY): Payer: Medicare Other | Admitting: Adult Health

## 2016-03-30 DIAGNOSIS — F32A Depression, unspecified: Secondary | ICD-10-CM

## 2016-03-30 DIAGNOSIS — E039 Hypothyroidism, unspecified: Secondary | ICD-10-CM | POA: Diagnosis not present

## 2016-03-30 DIAGNOSIS — Z7901 Long term (current) use of anticoagulants: Secondary | ICD-10-CM

## 2016-03-30 DIAGNOSIS — I5042 Chronic combined systolic (congestive) and diastolic (congestive) heart failure: Secondary | ICD-10-CM

## 2016-03-30 DIAGNOSIS — E785 Hyperlipidemia, unspecified: Secondary | ICD-10-CM

## 2016-03-30 DIAGNOSIS — N184 Chronic kidney disease, stage 4 (severe): Secondary | ICD-10-CM

## 2016-03-30 DIAGNOSIS — N400 Enlarged prostate without lower urinary tract symptoms: Secondary | ICD-10-CM

## 2016-03-30 DIAGNOSIS — D638 Anemia in other chronic diseases classified elsewhere: Secondary | ICD-10-CM

## 2016-03-30 DIAGNOSIS — E875 Hyperkalemia: Secondary | ICD-10-CM

## 2016-03-30 DIAGNOSIS — I480 Paroxysmal atrial fibrillation: Secondary | ICD-10-CM

## 2016-03-30 DIAGNOSIS — F329 Major depressive disorder, single episode, unspecified: Secondary | ICD-10-CM

## 2016-03-30 DIAGNOSIS — R531 Weakness: Secondary | ICD-10-CM | POA: Diagnosis not present

## 2016-03-30 NOTE — Progress Notes (Signed)
Patient ID: Frank Mclean, male   DOB: 1941-12-19, 75 y.o.   MRN: [id number]     DATE:     03/30/16  MRN:  [id number]  BIRTHDAY: [birth year]  Facility:  Nursing Home Location:  [facility]  Nursing Home Room Number: 706-P  LEVEL OF CARE:  SNF (31)  Contact Information    Name Relation Home Work Mobile   Baldwin Spouse [phone]  [phone]   Smith,Brandon Relative [phone]     Andree Coss Daughter   [phone]       Code Status History    Date Active Date Inactive Code Status Order ID Comments User Context   03/03/2016  1:05 AM 03/07/2016  8:47 PM DNR [id number]  Alberteen Sam, MD Inpatient   08/21/2015 11:54 AM 08/26/2015  4:20 PM Full Code [id number]  Alleen Borne, MD Inpatient   03/17/2015  6:46 PM 04/01/2015  2:02 PM Full Code [id number]  Charlton Amor, PA-C Inpatient   03/17/2015  6:46 PM 03/17/2015  6:46 PM Full Code [id number]  Charlton Amor, PA-C Inpatient   03/14/2015  4:55 AM 03/17/2015  6:46 PM Full Code [id number]  Lorretta Harp, MD Inpatient   03/12/2015  7:37 PM 03/13/2015  6:17 PM Full Code [id number]  Marinus Maw, MD Inpatient   03/11/2015  2:53 AM 03/12/2015  7:37 PM Full Code [id number]  Glori Luis, MD ED   02/13/2015 11:08 AM 02/13/2015  3:54 PM Full Code [id number]  Laurey Morale, MD Inpatient   11/26/2013  6:51 AM 11/28/2013  3:23 PM Full Code [id number]  Lynden Oxford, MD Inpatient   06/09/2013  7:53 PM 06/13/2013  5:24 PM Full Code [id number]  Henderson Cloud, MD Inpatient   12/30/2012 12:30 PM 12/31/2012 10:00 PM Full Code [id number]  Hollice Espy, MD Inpatient    Questions for Most Recent Historical Code Status (Order [id number])    Question Answer Comment   In the event of cardiac or respiratory ARREST Do not call a "code blue"    In the event of cardiac or respiratory ARREST Do not perform Intubation, CPR, defibrillation or ACLS    In the event of cardiac or respiratory ARREST Use medication by any route,  position, wound care, and other measures to relive pain and suffering. May use oxygen, suction and manual treatment of airway obstruction as needed for comfort.        Chief Complaint  Patient presents with  . Discharge Note    HISTORY OF PRESENT ILLNESS:  This is a 75 year old male who is for discharge home with Home health PT, OT and ST.  He has been admitted to [facility] on 03/07/16 from [facility] He has PMH of dementia, NICM and systolic and diastolic CHF EF 96% with ICD/Pacer, CKD III, NIDDM, hypothyroidism, PAF on Coumadin and Hx of stroke. He had a fall @ home and was noted to be having more confusion. CT of head with no acute finding. He was treated for with Rocephin for UTI and was discharged with oral Augmentin X 5 days. He was treated, also, for hypernatremia with D5W. He takes KCL 30 meq TID @ home which was given in the hospital. K @ discharge was 5.2 so KCL has been stopped. However, he had hypokalemia episode and K supplement was re-started. He is for K re-check tomorrow, 03/31/16.  Patient was admitted to this facility for short-term rehabilitation after the patient's recent hospitalization.  Patient has completed SNF  rehabilitation and therapy has cleared the patient for discharge.  PAST MEDICAL HISTORY:  Past Medical History  Diagnosis Date  . Gout   . HTN (hypertension)   . Hyperlipidemia   . Osteoarthritis   . History of CVA (cerebrovascular accident)     Left pontine infarct July 2004; changed from Plavix to Coumadin in 2004 per MD at [facility]  . GERD (gastroesophageal reflux disease)   . DJD (degenerative joint disease)   . BPH (benign prostatic hypertrophy)   . Peripheral vascular disease (HCC)   . Bilateral leg ulcer (HCC)     ACHILLES AREA--  NONHEALING  . CKD (chronic kidney disease) stage 3, GFR 30-59 ml/min   . CAD (coronary artery disease)     a. LHC 4/12: Mid LAD 25%, mid diagonal 30%, AV circumflex 40%, proximal OM 25%, distal RCA 40%  .  Chronic combined systolic and diastolic heart failure (HCC)     a. Echo 05/2013: EF 25-30%, diffuse HK, restrictive physiology, trivial AI, mild MR, moderate LAE, reduced RV systolic function, PASP 42  . LBBB (left bundle branch block)   . PAF (paroxysmal atrial fibrillation) (HCC)     a. amiodarone Rx started 05/2013;  b. chronic coumadin  . NICM (nonischemic cardiomyopathy) (HCC)     a. EF 25-30%.  [name] Hx of cardiovascular stress test     Nuclear Stress Test (1/16): High risk stress nuclear study with a large, severe, partially reversible inferior and apical defect consistent with prior inferior and apical infarct; mild apical ischemia; severe LVE; study high risk due to reduced LV function.  EF 25% >> reviewed with Dr. Antoine Poche >> medical mgmt  . Hypothyroidism   . DM (diabetes mellitus), type 2 with renal complications (HCC)   . Presence of permanent cardiac pacemaker   . AICD (automatic cardioverter/defibrillator) present   . Stroke (HCC)   . Chronic combined systolic and diastolic CHF (congestive heart failure) (HCC)   . Shortness of breath dyspnea   . Pneumonia   . Chronic depression   . Hypernatremia   . Memory loss   . Anticoagulated on Coumadin   . Anemia in chronic renal disease   . Elevated troponin   . Hyperbilirubinemia   . Physical deconditioning   . Acute cystitis without hematuria   . Hypokalemia   . Anemia of chronic disease   . Loose stools   . Hyperkalemia   . Hypokalemia      CURRENT MEDICATIONS: Reviewed  Patient's Medications  New Prescriptions   No medications on file  Previous Medications   ACETAMINOPHEN (TYLENOL) 325 MG TABLET    Take 1 tablet (325 mg total) by mouth every 6 (six) hours as needed for mild pain (or Fever >/= 101).   ATORVASTATIN (LIPITOR) 20 MG TABLET    Take 1 tablet (20 mg total) by mouth daily.   CARVEDILOL (COREG) 6.25 MG TABLET    Take 1 tablet (6.25 mg total) by mouth 2 (two) times daily with a meal.   CHOLECALCIFEROL (VITAMIN D-3  PO)    Take 1,000 Units by mouth daily.   ESCITALOPRAM (LEXAPRO) 5 MG TABLET    TAKE 1 TABLET BY MOUTH EVERY DAY   FERROUS SULFATE 325 (65 FE) MG TABLET    Take 1 tablet (325 mg total) by mouth daily.   HYDRALAZINE (APRESOLINE) 25 MG TABLET    Take 1 tablet (25 mg total) by mouth 3 (three) times daily.   HYDROCODONE-ACETAMINOPHEN (NORCO/VICODIN) 5-325 MG TABLET  Take 1 tablet by mouth every 8 (eight) hours as needed for moderate pain. Do not exceed 4gm of Tylenol in 24 hours   ISOSORBIDE MONONITRATE (IMDUR) 30 MG 24 HR TABLET    Take 1 tablet (30 mg total) by mouth 2 (two) times daily.   LEVOTHYROXINE (SYNTHROID, LEVOTHROID) 175 MCG TABLET    Take 175 mcg by mouth daily before breakfast.   MENTHOL, TOPICAL ANALGESIC, (BIOFREEZE) 4 % GEL    Apply topically 2 (two) times daily. Apply to left shoulder and left ankle   OXYGEN    Inhale 2.5 L/min into the lungs See admin instructions. USES OXYGEN EVERY BEDTIME USES DURING DAY ONLY AS NEEDED   POTASSIUM CHLORIDE SA (K-DUR,KLOR-CON) 20 MEQ TABLET    Take 20 mEq by mouth daily.    SYMBICORT 160-4.5 MCG/ACT INHALER    INHALE 2 PUFFS INTO THE LUNGS 2 TIMES DAILY   TAMSULOSIN (FLOMAX) 0.4 MG CAPS CAPSULE    Take 1 capsule (0.4 mg total) by mouth daily.   TORSEMIDE (DEMADEX) 10 MG TABLET    Take 10 mg by mouth daily.   TRIAMCINOLONE CREAM (KENALOG) 0.5 %    Apply topically 2 (two) times daily.   VITAMIN B-12 (CYANOCOBALAMIN) 1000 MCG TABLET    Take 1,000 mcg by mouth daily.   WARFARIN (COUMADIN) 2.5 MG TABLET    Take 2.5 mg by mouth daily.  Modified Medications   No medications on file  Discontinued Medications   No medications on file     Allergies  Allergen Reactions  . Levothyroxine Sodium Other (See Comments)    MUST TAKE BRAND NAME GENERIC DOESN'T WORK FOR PATIENT     REVIEW OF SYSTEMS:  GENERAL: no change in appetite, no fatigue, no weight changes, no fever, chills or weakness EYES: Denies change in vision, dry eyes, eye pain, itching  or discharge EARS: Denies change in hearing, ringing in ears, or earache NOSE: Denies nasal congestion or epistaxis MOUTH and THROAT: Denies oral discomfort, gingival pain or bleeding, pain from teeth or hoarseness   RESPIRATORY: no cough, SOB, DOE, wheezing, hemoptysis CARDIAC: no chest pain, or palpitations, +edema GI: no abdominal pain, diarrhea, constipation, heart burn, nausea or vomiting GU: Denies dysuria, frequency, hematuria, incontinence, or discharge PSYCHIATRIC: Denies feeling of depression or anxiety. No report of hallucinations, insomnia, paranoia, or agitation   PHYSICAL EXAMINATION  GENERAL APPEARANCE: Well nourished. In no acute distress. Normal body habitus SKIN:  Skin is warm and dry.  HEAD: Normal in size and contour. No evidence of trauma EYES: Lids open and close normally. No blepharitis, entropion or ectropion. PERRL. Conjunctivae are clear and sclerae are white. Lenses are without opacity EARS: Pinnae are normal. Patient hears normal voice tunes of the examiner MOUTH and THROAT: Lips are without lesions. Oral mucosa is moist and without lesions. Tongue is normal in shape, size, and color and without lesions NECK: supple, trachea midline, no neck masses, no thyroid tenderness, no thyromegaly LYMPHATICS: no LAN in the neck, no supraclavicular LAN RESPIRATORY: breathing is even & unlabored, BS CTAB CARDIAC: RRR, no murmur,no extra heart sounds, BLE edema 2+ GI: abdomen soft, normal BS, no masses, no tenderness, no hepatomegaly, no splenomegaly EXTREMITIES:  Able to move X 4 extremities PSYCHIATRIC: Alert and oriented X 3. Affect and behavior are appropriate  LABS/RADIOLOGY: Labs reviewed: Basic Metabolic Panel:  Recent Labs  61/53/79 1107  03/03/16 0513  03/05/16 0429 03/06/16 0538  03/07/16 0535  03/16/16 03/17/16 03/23/16  NA 146*  < >  152*  < > 145 140  < > 140  < > 141 144 146  K 4.5  < > 3.1*  < > 4.2 4.9  --  5.2*  < > 4.4 4.8 5.4*  CL 110  < > 118*   < > 114* 107  --  110  --   --   --   --   CO2 28  < > 25  < > 22 21*  --  20*  --   --   --   --   GLUCOSE 100*  < > 148*  < > 123* 128*  --  108*  --   --   --   --   BUN 18  < > 30*  < > 31* 29*  < > 34*  < > 29* 35* 39*  CREATININE 1.96*  < > 2.13*  < > 2.46* 2.26*  < > 2.32*  < > 2.2* 2.2* 2.4*  CALCIUM 9.3  < > 8.9  < > 8.6* 8.8*  --  9.1  --   --   --   --   MG 2.1  --  1.9  --  2.0  --   --   --   --   --   --   --   < > = values in this interval not displayed. Liver Function Tests:  Recent Labs  08/12/15 1147 03/02/16 2227 03/03/16 0513 03/19/16  AST 15 24 17  60*  ALT 13* 28 23 96*  ALKPHOS 90 124 100 142*  BILITOT 0.7 1.7* 0.9  --   PROT 5.7* 6.4* 5.5*  --   ALBUMIN 3.5 3.8 3.3*  --    No results for input(s): LIPASE, AMYLASE in the last 8760 hours. CBC:  Recent Labs  03/02/16 2227 03/03/16 0513 03/04/16 0503  03/06/16 0538 03/10/16 03/11/16  WBC 8.1 6.6 8.1  < > 7.8 8.1 8.6  NEUTROABS 7.6  --   --   --   --  6 7  HGB 11.0* 9.6* 9.0*  --  9.7* 9.7* 9.4*  HCT 36.0* 31.5* 30.5*  --  31.9* 33* 32*  MCV 81.6 79.7 83.8  --  81.6  --   --   PLT 158 164 139*  --  146* 256 259  < > = values in this interval not displayed.  Cardiac Enzymes:  Recent Labs  03/02/16 2227 03/03/16 0513 03/04/16 0503  TROPONINI 0.24* 0.44* 0.21*   CBG:  Recent Labs  03/06/16 2111 03/07/16 0721 03/07/16 1138  GLUCAP 104* 101* 111*    Ct Head Wo Contrast  03/04/2016  CLINICAL DATA:  Dementia/ s/p fall. Pt fell on 3-14, unknown LOC, pt denies any pain, on blood thinners, unknown if hit head, hx dementia EXAM: CT HEAD WITHOUT CONTRAST TECHNIQUE: Contiguous axial images were obtained from the base of the skull through the vertex without intravenous contrast. COMPARISON:  03/25/2015 FINDINGS: There is central and cortical atrophy. Periventricular white matter changes are consistent with small vessel disease. There is a small remote lacunar infarct in the right thalamus. Bone windows  demonstrate no focal edema or hematoma of the scalp. No calvarial fracture. There atherosclerotic calcification of the internal carotid arteries. IMPRESSION: 1. Atrophy and small vessel disease. 2. Remote right the laminae infarct. 3. No evidence for acute intracranial abnormality. Electronically Signed   By: Norva Pavlov M.D.   On: 03/04/2016 09:17   Dg Chest Port 1 View  03/02/2016  CLINICAL DATA:  Fall this morning, now with weakness. EXAM: PORTABLE CHEST 1 VIEW COMPARISON:  None. FINDINGS: There is marked cardiomegaly. Left chest wall pacemaker/AICD in place without evidence of wire discontinuity. There is mild central pulmonary vascular congestion without frank alveolar pulmonary edema. Lungs are otherwise clear. Small portion of the right costophrenic angle is excluded on this exam. Osseous structures about the chest are unremarkable. IMPRESSION: 1. Marked cardiomegaly. Mild central pulmonary vascular congestion, probably chronic, without overt pulmonary edema. 2. Lungs are otherwise clear. No evidence of pneumonia or pleural effusion. This exam was interpreted during a PACS downtime with limited availability of comparison cases. It has been flagged for review following the downtime. If clinically indicated after this review, an addendum will be issued providing details about comparison to prior imaging. Electronically Signed   By: Bary Richard M.D.   On: 03/02/2016 22:23    ASSESSMENT/PLAN:  Generalized weakness - for Home health PT, OT and ST  Hyperkalemia - K 5.4; KCL has been held X 2 days and then decreased to 20 meq daily; BMP will be re-checked on 03/31/16  Anemia of chronic disease - hgb 9.7; re-checked hgb 9.7  Hyperlipidemia  - continue Atorvastatin 20 mg daily Lipid Panel     Component Value Date/Time   CHOL 97 03/19/2016   TRIG 101 03/19/2016   TRIG 89 12/21/2006 1117   HDL 25* 03/19/2016   CHOLHDL 5 06/24/2014 1139   CHOLHDL 3.8 CALC 12/21/2006 1117   VLDL 26.6  06/24/2014 1139   LDLCALC 52 03/19/2016   LDLDIRECT 177.9 06/17/2010 0909    Combined systolic and diastolic CHF -  has pacer/ICD; continue Coreg 6.25 mg BID, hydralazine 25 mg TID, Imdur ER 30 mg BID and torsemide 20 mg daily  Chronic Depression - mood is stable; continue Lexapro 5 mg daily  BPH - continue Flomax 0.4 mg 1 capsule daily  CKD stage 4 - creatinine 2.32; re-checked 2.45; for BMP check tomorrow, 03/31/16  Atrial fibrillation - rate-controlled; has pacer/ICD; continue Coreg and Coumadin  Hypothyroidism - tsh 0.146, recently decreased Synthroid to 175 mcg daily and for tsh level in 1 month  Long-term use of anticoagulant - INR 2.5, therapeutic; continue Coumadin 2.5 mg 1 tab by mouth daily, INR on 04/06/16       I have filled out patient's discharge paperwork and written prescriptions.  Patient will receive home health PT, OT and ST.  Total discharge time: Greater than 30 minutes  Discharge time involved coordination of the discharge process with social worker, nursing staff and therapy department. Medical justification for home health services verified.     [facility], NP [employer] [phone]

## 2016-03-31 LAB — BASIC METABOLIC PANEL
BUN: 36 mg/dL — AB (ref 4–21)
Creatinine: 2.5 mg/dL — AB (ref 0.6–1.3)
GLUCOSE: 108 mg/dL
Potassium: 3.7 mmol/L (ref 3.4–5.3)
Sodium: 147 mmol/L (ref 137–147)

## 2016-04-01 ENCOUNTER — Non-Acute Institutional Stay (SKILLED_NURSING_FACILITY): Payer: Medicare Other | Admitting: Adult Health

## 2016-04-01 ENCOUNTER — Encounter: Payer: Self-pay | Admitting: Adult Health

## 2016-04-01 DIAGNOSIS — N184 Chronic kidney disease, stage 4 (severe): Secondary | ICD-10-CM | POA: Diagnosis not present

## 2016-04-01 DIAGNOSIS — E870 Hyperosmolality and hypernatremia: Secondary | ICD-10-CM | POA: Diagnosis not present

## 2016-04-01 NOTE — Progress Notes (Signed)
Patient ID: Frank Mclean, male   DOB: 1941/11/23, 75 y.o.   MRN: [id number]     DATE:     04/01/16  MRN:  [id number]  BIRTHDAY: [birth year]  Facility:  Nursing Home Location:  [facility]  Nursing Home Room Number: 706-P  LEVEL OF CARE:  SNF (31)  Contact Information    Name Relation Home Work Mobile   Lind Spouse [phone]  [phone]   Smith,Brandon Relative [phone]     Andree Coss Daughter   [phone]       Code Status History    Date Active Date Inactive Code Status Order ID Comments User Context   03/03/2016  1:05 AM 03/07/2016  8:47 PM DNR [id number]  Alberteen Sam, MD Inpatient   08/21/2015 11:54 AM 08/26/2015  4:20 PM Full Code [id number]  Alleen Borne, MD Inpatient   03/17/2015  6:46 PM 04/01/2015  2:02 PM Full Code [id number]  Charlton Amor, PA-C Inpatient   03/17/2015  6:46 PM 03/17/2015  6:46 PM Full Code [id number]  Charlton Amor, PA-C Inpatient   03/14/2015  4:55 AM 03/17/2015  6:46 PM Full Code [id number]  Lorretta Harp, MD Inpatient   03/12/2015  7:37 PM 03/13/2015  6:17 PM Full Code [id number]  Marinus Maw, MD Inpatient   03/11/2015  2:53 AM 03/12/2015  7:37 PM Full Code [id number]  Glori Luis, MD ED   02/13/2015 11:08 AM 02/13/2015  3:54 PM Full Code [id number]  Laurey Morale, MD Inpatient   11/26/2013  6:51 AM 11/28/2013  3:23 PM Full Code [id number]  Lynden Oxford, MD Inpatient   06/09/2013  7:53 PM 06/13/2013  5:24 PM Full Code [id number]  Henderson Cloud, MD Inpatient   12/30/2012 12:30 PM 12/31/2012 10:00 PM Full Code [id number]  Hollice Espy, MD Inpatient    Questions for Most Recent Historical Code Status (Order [id number])    Question Answer Comment   In the event of cardiac or respiratory ARREST Do not call a "code blue"    In the event of cardiac or respiratory ARREST Do not perform Intubation, CPR, defibrillation or ACLS    In the event of cardiac or respiratory ARREST Use medication by any route,  position, wound care, and other measures to relive pain and suffering. May use oxygen, suction and manual treatment of airway obstruction as needed for comfort.        Chief Complaint  Patient presents with  . Acute Visit    Hypernatremia    HISTORY OF PRESENT ILLNESS:  This is a 75 year old male who is for discharge home tomorrow, 04/02/16. He was noted to have NA 147 and creatinine 2.47. He has BLE edema 2+ and currently takes Torsemide 10 mg daily. Latest K is 3.7 and currently on KCL ER 20 meq daily.  He has been admitted to [facility] on 03/07/16 from [facility]. He has PMH of dementia, NICM and systolic and diastolic CHF EF 96% with ICD/Pacer, CKD III, NIDDM, hypothyroidism, PAF on Coumadin and Hx of stroke. He had a fall @ home and was noted to be having more confusion. CT of head with no acute finding. He w as treated for with Rocephin for UTI and was discharged with oral Augmentin X 5 days. He was treated, also, for hypernatremia with D5W. He takes KCL 30 meq TID @ home which was given in the hospital. K @ discharge was 5.2 so KCL has been stopped. However, he had  hypokalemia episode and K supplement was re-started.   Patient was admitted to this facility for short-term rehabilitation after the patient's recent hospitalization.  Patient has completed SNF rehabilitation and therapy has cleared the patient for discharge.  PAST MEDICAL HISTORY:  Past Medical History  Diagnosis Date  . Gout   . HTN (hypertension)   . Hyperlipidemia   . Osteoarthritis   . History of CVA (cerebrovascular accident)     Left pontine infarct July 2004; changed from Plavix to Coumadin in 2004 per MD at [facility]  . GERD (gastroesophageal reflux disease)   . DJD (degenerative joint disease)   . BPH (benign prostatic hypertrophy)   . Peripheral vascular disease (HCC)   . Bilateral leg ulcer (HCC)     ACHILLES AREA--  NONHEALING  . CKD (chronic kidney disease) stage 3, GFR 30-59 ml/min   . CAD  (coronary artery disease)     a. LHC 4/12: Mid LAD 25%, mid diagonal 30%, AV circumflex 40%, proximal OM 25%, distal RCA 40%  . Chronic combined systolic and diastolic heart failure (HCC)     a. Echo 05/2013: EF 25-30%, diffuse HK, restrictive physiology, trivial AI, mild MR, moderate LAE, reduced RV systolic function, PASP 42  . LBBB (left bundle branch block)   . PAF (paroxysmal atrial fibrillation) (HCC)     a. amiodarone Rx started 05/2013;  b. chronic coumadin  . NICM (nonischemic cardiomyopathy) (HCC)     a. EF 25-30%.  [name] Hx of cardiovascular stress test     Nuclear Stress Test (1/16): High risk stress nuclear study with a large, severe, partially reversible inferior and apical defect consistent with prior inferior and apical infarct; mild apical ischemia; severe LVE; study high risk due to reduced LV function.  EF 25% >> reviewed with Dr. Antoine Poche >> medical mgmt  . Hypothyroidism   . DM (diabetes mellitus), type 2 with renal complications (HCC)   . Presence of permanent cardiac pacemaker   . AICD (automatic cardioverter/defibrillator) present   . Stroke (HCC)   . Chronic combined systolic and diastolic CHF (congestive heart failure) (HCC)   . Shortness of breath dyspnea   . Pneumonia   . Chronic depression   . Hypernatremia   . Memory loss   . Anticoagulated on Coumadin   . Anemia in chronic renal disease   . Elevated troponin   . Hyperbilirubinemia   . Physical deconditioning   . Acute cystitis without hematuria   . Hypokalemia   . Anemia of chronic disease   . Loose stools   . Hyperkalemia   . Hypokalemia      CURRENT MEDICATIONS: Reviewed  Patient's Medications  New Prescriptions   No medications on file  Previous Medications   ACETAMINOPHEN (TYLENOL) 325 MG TABLET    Take 1 tablet (325 mg total) by mouth every 6 (six) hours as needed for mild pain (or Fever >/= 101).   ATORVASTATIN (LIPITOR) 20 MG TABLET    Take 1 tablet (20 mg total) by mouth daily.   CARVEDILOL  (COREG) 6.25 MG TABLET    Take 1 tablet (6.25 mg total) by mouth 2 (two) times daily with a meal.   CHOLECALCIFEROL (VITAMIN D-3 PO)    Take 1,000 Units by mouth daily.   ESCITALOPRAM (LEXAPRO) 5 MG TABLET    TAKE 1 TABLET BY MOUTH EVERY DAY   FERROUS SULFATE 325 (65 FE) MG TABLET    Take 1 tablet (325 mg total) by mouth daily.   HYDRALAZINE (APRESOLINE)  25 MG TABLET    Take 1 tablet (25 mg total) by mouth 3 (three) times daily.   HYDROCODONE-ACETAMINOPHEN (NORCO/VICODIN) 5-325 MG TABLET    Take 1 tablet by mouth every 8 (eight) hours as needed for moderate pain. Do not exceed 4gm of Tylenol in 24 hours   ISOSORBIDE MONONITRATE (IMDUR) 30 MG 24 HR TABLET    Take 1 tablet (30 mg total) by mouth 2 (two) times daily.   LEVOTHYROXINE (SYNTHROID, LEVOTHROID) 175 MCG TABLET    Take 175 mcg by mouth daily before breakfast.   MENTHOL, TOPICAL ANALGESIC, (BIOFREEZE) 4 % GEL    Apply topically 2 (two) times daily. Apply to left shoulder and left ankle   OXYGEN    Inhale 2.5 L/min into the lungs See admin instructions. USES OXYGEN EVERY BEDTIME USES DURING DAY ONLY AS NEEDED   POTASSIUM CHLORIDE SA (K-DUR,KLOR-CON) 20 MEQ TABLET    Take 20 mEq by mouth daily.    SYMBICORT 160-4.5 MCG/ACT INHALER    INHALE 2 PUFFS INTO THE LUNGS 2 TIMES DAILY   TAMSULOSIN (FLOMAX) 0.4 MG CAPS CAPSULE    Take 1 capsule (0.4 mg total) by mouth daily.   TORSEMIDE (DEMADEX) 10 MG TABLET    Take 10 mg by mouth daily.   TRIAMCINOLONE CREAM (KENALOG) 0.5 %    Apply topically 2 (two) times daily.   VITAMIN B-12 (CYANOCOBALAMIN) 1000 MCG TABLET    Take 1,000 mcg by mouth daily.   WARFARIN (COUMADIN) 2.5 MG TABLET    Take 2.5 mg by mouth daily.  Modified Medications   No medications on file  Discontinued Medications   No medications on file     Allergies  Allergen Reactions  . Levothyroxine Sodium Other (See Comments)    MUST TAKE BRAND NAME GENERIC DOESN'T WORK FOR PATIENT     REVIEW OF SYSTEMS:  GENERAL: no change in  appetite, no fatigue, no weight changes, no fever, chills or weakness EYES: Denies change in vision, dry eyes, eye pain, itching or discharge EARS: Denies change in hearing, ringing in ears, or earache NOSE: Denies nasal congestion or epistaxis MOUTH and THROAT: Denies oral discomfort, gingival pain or bleeding, pain from teeth or hoarseness   RESPIRATORY: no cough, SOB, DOE, wheezing, hemoptysis CARDIAC: no chest pain, or palpitations, +edema GI: no abdominal pain, diarrhea, constipation, heart burn, nausea or vomiting GU: Denies dysuria, frequency, hematuria, incontinence, or discharge PSYCHIATRIC: Denies feeling of depression or anxiety. No report of hallucinations, insomnia, paranoia, or agitation   PHYSICAL EXAMINATION  GENERAL APPEARANCE: Well nourished. In no acute distress. Normal body habitus SKIN:  Skin is warm and dry.  HEAD: Normal in size and contour. No evidence of trauma EYES: Lids open and close normally. No blepharitis, entropion or ectropion. PERRL. Conjunctivae are clear and sclerae are white. Lenses are without opacity EARS: Pinnae are normal. Patient hears normal voice tunes of the examiner MOUTH and THROAT: Lips are without lesions. Oral mucosa is moist and without lesions. Tongue is normal in shape, size, and color and without lesions NECK: supple, trachea midline, no neck masses, no thyroid tenderness, no thyromegaly LYMPHATICS: no LAN in the neck, no supraclavicular LAN RESPIRATORY: breathing is even & unlabored, BS CTAB CARDIAC: RRR, no murmur,no extra heart sounds, BLE edema 2+ GI: abdomen soft, normal BS, no masses, no tenderness, no hepatomegaly, no splenomegaly EXTREMITIES:  Able to move X 4 extremities PSYCHIATRIC: Alert and oriented X 3. Affect and behavior are appropriate  LABS/RADIOLOGY: Labs reviewed: Basic Metabolic  Panel:  Recent Labs  06/20/15 1107  03/03/16 0513  03/05/16 0429 03/06/16 0538  03/07/16 0535  03/17/16 03/23/16 03/31/16  NA  146*  < > 152*  < > 145 140  < > 140  < > 144 146 147  K 4.5  < > 3.1*  < > 4.2 4.9  --  5.2*  < > 4.8 5.4* 3.7  CL 110  < > 118*  < > 114* 107  --  110  --   --   --   --   CO2 28  < > 25  < > 22 21*  --  20*  --   --   --   --   GLUCOSE 100*  < > 148*  < > 123* 128*  --  108*  --   --   --   --   BUN 18  < > 30*  < > 31* 29*  < > 34*  < > 35* 39* 36*  CREATININE 1.96*  < > 2.13*  < > 2.46* 2.26*  < > 2.32*  < > 2.2* 2.4* 2.5*  CALCIUM 9.3  < > 8.9  < > 8.6* 8.8*  --  9.1  --   --   --   --   MG 2.1  --  1.9  --  2.0  --   --   --   --   --   --   --   < > = values in this interval not displayed. Liver Function Tests:  Recent Labs  08/12/15 1147 03/02/16 2227 03/03/16 0513 03/19/16  AST 15 24 17  60*  ALT 13* 28 23 96*  ALKPHOS 90 124 100 142*  BILITOT 0.7 1.7* 0.9  --   PROT 5.7* 6.4* 5.5*  --   ALBUMIN 3.5 3.8 3.3*  --    No results for input(s): LIPASE, AMYLASE in the last 8760 hours. CBC:  Recent Labs  03/02/16 2227 03/03/16 0513 03/04/16 0503  03/06/16 0538 03/10/16 03/11/16  WBC 8.1 6.6 8.1  < > 7.8 8.1 8.6  NEUTROABS 7.6  --   --   --   --  6 7  HGB 11.0* 9.6* 9.0*  --  9.7* 9.7* 9.4*  HCT 36.0* 31.5* 30.5*  --  31.9* 33* 32*  MCV 81.6 79.7 83.8  --  81.6  --   --   PLT 158 164 139*  --  146* 256 259  < > = values in this interval not displayed.  Cardiac Enzymes:  Recent Labs  03/02/16 2227 03/03/16 0513 03/04/16 0503  TROPONINI 0.24* 0.44* 0.21*   CBG:  Recent Labs  03/06/16 2111 03/07/16 0721 03/07/16 1138  GLUCAP 104* 101* 111*    Ct Head Wo Contrast  03/04/2016  CLINICAL DATA:  Dementia/ s/p fall. Pt fell on 3-14, unknown LOC, pt denies any pain, on blood thinners, unknown if hit head, hx dementia EXAM: CT HEAD WITHOUT CONTRAST TECHNIQUE: Contiguous axial images were obtained from the base of the skull through the vertex without intravenous contrast. COMPARISON:  03/25/2015 FINDINGS: There is central and cortical atrophy. Periventricular white  matter changes are consistent with small vessel disease. There is a small remote lacunar infarct in the right thalamus. Bone windows demonstrate no focal edema or hematoma of the scalp. No calvarial fracture. There atherosclerotic calcification of the internal carotid arteries. IMPRESSION: 1. Atrophy and small vessel disease. 2. Remote right the laminae infarct. 3. No evidence  for acute intracranial abnormality. Electronically Signed   By: Norva Pavlov M.D.   On: 03/04/2016 09:17    ASSESSMENT/PLAN:  Hypernatremia - NA 147; give 1/2 NS @ 70 ml/hour; BMP after  CKD stage 4 - creatinine 2.45; re-checked creatinine 2.47; will re-check after IVF    [facility], NP [facility] [phone]

## 2016-04-03 ENCOUNTER — Other Ambulatory Visit: Payer: Self-pay | Admitting: Internal Medicine

## 2016-04-05 ENCOUNTER — Other Ambulatory Visit: Payer: Self-pay | Admitting: Pharmacist Clinician (PhC)/ Clinical Pharmacy Specialist

## 2016-04-05 ENCOUNTER — Telehealth: Payer: Self-pay | Admitting: Internal Medicine

## 2016-04-05 MED ORDER — WARFARIN SODIUM 5 MG PO TABS: ORAL_TABLET | ORAL | Status: DC

## 2016-04-05 NOTE — Progress Notes (Signed)
Cardiology Office Note:    Date:  04/06/2016   ID:  SOCORRO EBRON, DOB 22-Jan-1941, MRN [id number]  PCP:  [name], MD  Cardiologist:  Dr. Rollene Rotunda   Electrophysiologist:  Dr. Lewayne Bunting  CHF: Dr. Marca Ancona   Referring MD: Posey Rea Georgina Quint, MD   Chief Complaint  Patient presents with  . Leg Swelling    History of Present Illness:     Frank Mclean is a 75 y.o. 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  Echo 30-35%, grade 3 DD, mod MR, mod LAE, mod reduced RV function, severely dilated RA, PASP . He continued to have class III symptoms and eventually was referred to Dr. Laneta Simmers in 8/16 for consideration of LV epicardial lead placement. This was undertaken in 9/16 with revision of CRT system. Last seen by Dr. Ladona Ridgel in 12/16 was somewhat improved symptoms.  Admitted 3/14-3/19 with hypernatremia and altered mental status in the setting of dehydration and probable UTI and worsening dementia. Sodium was 155 upon admission and creatinine was 2.1. He was noted to have nonadherence to medications recently due to worsening dementia. He was hydrated with D5W. Sodium improved to 140 at discharge. He was discharged to [facility]. Discharged to home 03/31/16. Wife Called the office yesterday with increasing lower extremity edema and increasing confusion. She was advised to take him to the emergency room but decided to wait until today.  He is here with his wife.  It has been almost a year since I have seen him.  His functional status and memory has deteriorated quite a bit in that time.  His wife helps with the hx.  His weight is up ~ 6 lbs  since leaving [facility] several days ago.  He notes DOE with minimal activity. LE edema is worse as well as abdominal bloating. He has been wheezing.  Denies orthopnea, PND.  Denies chest pain. He is not eating well at all.  She is not sure if he takes his medications.  Since leaving [facility], she has been giving him Torsemide 10 mg alt with 20 mg every other day along with K+ 30 mEq bid.    Past Medical History  Diagnosis Date  . Gout   . HTN (hypertension)   . Hyperlipidemia   . Osteoarthritis   . History of CVA (cerebrovascular accident)     Left pontine infarct July 2004; changed from Plavix to Coumadin in 2004 per MD at [facility]  . GERD (gastroesophageal reflux disease)   . DJD (degenerative joint disease)   . BPH (benign prostatic hypertrophy)   . Peripheral vascular disease (HCC)   . Bilateral leg ulcer (HCC)     ACHILLES AREA--  NONHEALING  . CKD (chronic kidney disease) stage 3, GFR 30-59 ml/min   . CAD (coronary artery disease)     a. LHC 4/12: Mid LAD 25%, mid diagonal 30%, AV circumflex 40%, proximal OM 25%, distal RCA 40%  . Chronic combined systolic and diastolic heart failure (HCC)     a. Echo 05/2013: EF 25-30%, diffuse HK, restrictive physiology, trivial AI, mild MR, moderate LAE, reduced RV systolic function, PASP 42  . LBBB (left bundle branch block)   .  PAF (paroxysmal atrial fibrillation) (HCC)     a. amiodarone Rx started 05/2013;  b. chronic coumadin  . NICM (nonischemic cardiomyopathy) (HCC)     a. EF 25-30%.  [name] Hx of cardiovascular stress test     Nuclear Stress Test (1/16): High risk stress nuclear study with a large, severe, partially reversible inferior and apical defect consistent with prior inferior and apical infarct; mild apical ischemia; severe LVE; study high risk due to reduced LV function.  EF 25% >> reviewed with Dr. Antoine Poche >> medical mgmt  . Hypothyroidism   . DM (diabetes mellitus), type 2 with renal complications (HCC)   . Presence of  permanent cardiac pacemaker   . AICD (automatic cardioverter/defibrillator) present   . Stroke (HCC)   . Chronic combined systolic and diastolic CHF (congestive heart failure) (HCC)   . Shortness of breath dyspnea   . Pneumonia   . Chronic depression   . Hypernatremia   . Memory loss   . Anticoagulated on Coumadin   . Anemia in chronic renal disease   . Elevated troponin   . Hyperbilirubinemia   . Physical deconditioning   . Acute cystitis without hematuria   . Hypokalemia   . Anemia of chronic disease   . Loose stools   . Hyperkalemia   . Hypokalemia     Past Surgical History  Procedure Laterality Date  . Cardiac catheterization  04-16-2011   DR [facility]    NON-OBSTRUCTIVE CAD. MILDLY ELEVATED PULMONARY PRESSURES/ ELEVATED  END-DIASTOLIC PRESSURE  . Transthoracic echocardiogram  12-31-2010    MODERATE CONCENTRIC LVH/ SYSTOLIC FUNCTION SEVERELY REDUCED/ EF 25-30%/  SEVERE HYPOKINESIS OF ANTEROSEPTAL MYOCARDIUM  AND ENTIREAPICAL MYOCARDIUM /  MODERATE HYPOKINESIS OF LATERAL, INFEROLATERAL, INFERIOR,AND INFEROSEPTAL MYOCARIUM/  GRADE 3 DIASTOLIC DYSFUNCTION/ MILD MR  . Aortogram w/ bilateral lower extremitiy runoff  05-18-2013  DR [facility]ELDS    LEFT LEG OCCLUDED PERONEAL AND ANTERIOR TIBIAL ARTERIES/ HIGH GRADE STENOIS 80% MIDDLE AND DISTAL THIRD OF POSTERIOR TIBIAL ARTERY/ RIGHT PERONEAL AND ANTERIOR TIBIAL ARTERY OCCLUDED/ 40% STENOSIS DISTALLY   . Abdominal aortagram N/A 05/18/2013    Procedure: ABDOMINAL Ronny Flurry;  Surgeon: Sherren Kerns, MD;  Location: [facility] CATH LAB;  Service: Cardiovascular;  Laterality: N/A;  . Lower extremity angiogram Bilateral 05/18/2013    Procedure: LOWER EXTREMITY ANGIOGRAM;  Surgeon: Sherren Kerns, MD;  Location: [facility] CATH LAB;  Service: Cardiovascular;  Laterality: Bilateral;  . Right heart catheterization N/A 02/13/2015    Procedure: RIGHT HEART CATH;  Surgeon: Laurey Morale, MD;  Location: [facility] CATH LAB;  Service: Cardiovascular;  Laterality: N/A;  .  Bi-ventricular implantable cardioverter defibrillator N/A 03/12/2015    Procedure: BI-VENTRICULAR IMPLANTABLE CARDIOVERTER DEFIBRILLATOR  (CRT-D);  Surgeon: Marinus Maw, MD;  Location: [facility] CATH LAB;  Service: Cardiovascular;  Laterality: N/A;  . Epicardial pacing lead placement N/A 08/21/2015    Procedure: EPICARDIAL PACING LEAD PLACEMENT;  Surgeon: Alleen Borne, MD;  Location: [facility] OR;  Service: Thoracic;  Laterality: N/A;  . Thoracotomy Left 08/21/2015    Procedure: MINI/LIMITED THORACOTOMY;  Surgeon: Alleen Borne, MD;  Location: [facility] OR;  Service: Thoracic;  Laterality: Left;    Current Medications: Outpatient Prescriptions Prior to Visit  Medication Sig Dispense Refill  . acetaminophen (TYLENOL) 325 MG tablet Take 1 tablet (325 mg total) by mouth every 6 (six) hours as needed for mild pain (or Fever >/= 101).    [name] atorvastatin (LIPITOR) 20 MG tablet Take 1 tablet (20 mg total) by mouth daily. 90 tablet  3  . carvedilol (COREG) 6.25 MG tablet Take 1 tablet (6.25 mg total) by mouth 2 (two) times daily with a meal. 60 tablet 6  . Cholecalciferol (VITAMIN D-3 PO) Take 1,000 Units by mouth daily.    [name] escitalopram (LEXAPRO) 5 MG tablet TAKE 1 TABLET BY MOUTH EVERY DAY 30 tablet 3  . ferrous sulfate 325 (65 FE) MG tablet Take 1 tablet (325 mg total) by mouth daily. 30 tablet 11  . hydrALAZINE (APRESOLINE) 25 MG tablet Take 1 tablet (25 mg total) by mouth 3 (three) times daily.    [name] HYDROcodone-acetaminophen (NORCO/VICODIN) 5-325 MG tablet Take 1 tablet by mouth every 8 (eight) hours as needed for moderate pain. Do not exceed 4gm of Tylenol in 24 hours 90 tablet 0  . isosorbide mononitrate (IMDUR) 30 MG 24 hr tablet Take 1 tablet (30 mg total) by mouth 2 (two) times daily.    [name] levothyroxine (SYNTHROID, LEVOTHROID) 175 MCG tablet Take 175 mcg by mouth daily before breakfast.    . Menthol, Topical Analgesic, (BIOFREEZE) 4 % GEL Apply topically 2 (two) times daily. Apply to left shoulder and left ankle     . OXYGEN Inhale 2.5 L/min into the lungs See admin instructions. USES OXYGEN EVERY BEDTIME USES DURING DAY ONLY AS NEEDED FOR SHORTNESS OF BREATH    . potassium chloride SA (K-DUR,KLOR-CON) 20 MEQ tablet Take 20 mEq by mouth daily.     . SYMBICORT 160-4.5 MCG/ACT inhaler INHALE 2 PUFFS INTO THE LUNGS 2 TIMES DAILY 10.2 Inhaler 5  . tamsulosin (FLOMAX) 0.4 MG CAPS capsule Take 1 capsule (0.4 mg total) by mouth daily. 30 capsule 1  . torsemide (DEMADEX) 10 MG tablet Take 10 mg by mouth daily.    [name] triamcinolone cream (KENALOG) 0.5 % Apply topically 2 (two) times daily. 60 g 2  . vitamin B-12 (CYANOCOBALAMIN) 1000 MCG tablet Take 1,000 mcg by mouth daily.    [name] warfarin (COUMADIN) 5 MG tablet Take 1 tablet by mouth daily or as directed by coumadin clinic. 30 tablet 0   No facility-administered medications prior to visit.     Allergies:   Levothyroxine sodium   Social History   Social History  . Marital Status: Married    Spouse Name: N/A  . Number of Children: N/A  . Years of Education: N/A   Occupational History  . Retired - Working in Product manager    Social History Main Topics  . Smoking status: Former Smoker    Quit date: 04/12/1974  . Smokeless tobacco: Never Used  . Alcohol Use: No     Comment: previously drank - "love cognac" quit 15 yrs ago.  . Drug Use: No  . Sexual Activity: No   Other Topics Concern  . None   Social History Narrative   Worked at [employer] year.  He is married and lives with his wife [name] in [place].  Has one daughter Lowella Bandy     Family History:  [name] family history includes Diabetes in his mother; Gout in his other; Heart attack in his maternal grandmother; Heart disease in his father and mother; Hypertension in his father and mother; Stroke in his other. There is no history of Thyroid disease.   ROS:   Please see the history of present illness.    Review of Systems  Constitution: Positive for decreased appetite and weight  gain.  Cardiovascular: Positive for dyspnea on exertion and leg swelling.  Respiratory: Positive for shortness of breath and  wheezing.   Musculoskeletal: Positive for back pain.  Psychiatric/Behavioral: Positive for depression. The patient is nervous/anxious.    All other systems reviewed and are negative.   Physical Exam:    VS:  BP 92/60 mmHg  Pulse 92  Ht 6' 2.5" (1.892 m)  Wt 217 lb 9.6 oz (98.703 kg)  BMI 27.57 kg/m2  SpO2 99%   GEN: Well nourished, well developed, in no acute distress HEENT: normal Neck: + JVD at 90 degrees, no masses Cardiac: Normal S1/S2, irreg irreg rhythm; no murmurs, rubs, or gallops, 1-2+ bilat LE edema to his knees;     Respiratory:  clear to auscultation bilaterally; no wheezing, rhonchi or rales GI: soft, + pitting edema lower abdomen MS: + presacral edema Skin: warm and dry Neuro: No focal deficits  Psych: Flat affect  Wt Readings from Last 3 Encounters:  04/06/16 217 lb 9.6 oz (98.703 kg)  04/01/16 211 lb (95.709 kg)  03/30/16 206 lb (93.441 kg)      Studies/Labs Reviewed:     EKG:  EKG is  ordered today.  The ekg ordered today demonstrates V paced, probable AFib, HR 92  Recent Labs: 06/20/2015: Pro B Natriuretic peptide (BNP) 539.0* 03/03/2016: B Natriuretic Peptide 1253.1* 03/05/2016: Magnesium 2.0 03/11/2016: Hemoglobin 9.4*; Platelets 259 03/15/2016: TSH 0.15* 03/19/2016: ALT 96* 03/31/2016: BUN 36*; Creatinine 2.5*; Potassium 3.7; Sodium 147   Recent Lipid Panel    Component Value Date/Time   CHOL 97 03/19/2016   TRIG 101 03/19/2016   TRIG 89 12/21/2006 1117   HDL 25* 03/19/2016   CHOLHDL 5 06/24/2014 1139   CHOLHDL 3.8 CALC 12/21/2006 1117   VLDL 26.6 06/24/2014 1139   LDLCALC 52 03/19/2016   LDLDIRECT 177.9 06/17/2010 0909    Additional studies/ records that were reviewed today include:    Echo 02/26/15 - MildLVH. EF 30% to 35%.Grade 3diastolic dysfunction. - Aortic valve: There was trivial regurgitation. - Mitral  valve: There was moderate regurgitation. - Left atrium: The atrium was moderately dilated. - Right ventricle: The cavity size was moderately dilated. Systolicfunction was moderately reduced. - Right atrium: The atrium was severely dilated. - Pulmonary arteries: Systolic pressure was moderately increased. PA peak pressure: 54 mm Hg (S).  Myoview 01/04/15 High risk stress nuclear study with a large, severe, partially reversible inferior and apical defect consistent with prior inferior and apical infarct; mild apical ischemia; severe LVE; study high risk due to reduced LV function. LV Ejection Fraction: 25%. LV Wall Motion: Global hypokinesis. (med Rx recommended)    ASSESSMENT:     1. Acute on chronic combined systolic and diastolic CHF (congestive heart failure) (HCC)   2. NICM (nonischemic cardiomyopathy) (HCC)   3. Chronic atrial fibrillation (HCC)   4. Coronary artery disease involving native coronary artery of native heart without angina pectoris   5. ICD (implantable cardioverter-defibrillator), biventricular, in situ   6. CKD (chronic kidney disease) stage 3, GFR 30-59 ml/min   7. Anemia in chronic renal disease   8. Hyperlipidemia   9. Hypernatremia   10. Dementia, without behavioral disturbance     PLAN:     In order of problems listed above:  1. A/C combined systolic and diastolic CHF (congestive heart failure):  He is volume overloaded with some evidence of anasarca.  Of note, his Na was 147 prior to DC from SNF last week and he was given IVFs.  His weight is up about 11 lbs since last week.  He has a poor appetite and is not  eating very well.  I discussed adjusting diuretics at home with close FU vs admission for IV diuresis.  His wife is his main caregiver.  She is very concerned about caring for him at home.  She prefers he be admitted to the hospital.  I reviewed this with Dr. Delane Ginger (DOD) who also saw him.  We will admit him to [facility] today for IV diuresis.      -  Lasix 40 mg IV bid  -  K+ 40 mEq bid  -  Decrease Imdur to 30 QD given low BP  -  Follow renal function and K+ closely  -  Consider having HF team see him as well.  This may be a picture of low output (low BP, poor appetite).  -  No Echo since 3/16.  Will get FU echo to reassess LVEF.   -  May benefit from HF Clinic FU post DC.    2.NICM (nonischemic cardiomyopathy): He has not been on his Carvedilol since leaving the SNF.  Will continue hydralazine, nitrates. He is not on ACE inhibitor or ARB secondary to chronic kidney disease.  Will try to get him back on beta-blocker therapy at some point before DC.    3.Chronic AF (paroxysmal atrial fibrillation): He has had 100% AF since 12/16.  Continue Coumadin.  Will need to make sure he gets appropriate coumadin clinic FU after DC.  He was taken off Amiodarone in 2016 due to hypothyroidism.      4.Coronary artery disease - Hx of non-obs CAD by LHC in the past.   No angina. He is not on aspirin as he is on Coumadin. Continue statin, nitrates.  5.s/p CRT-D - He had some improvement after his epicardial LV lead was placed.  As noted, repeat Echo will be obtained.  FU with EP after DC as planned.   6.CKD - Recent Creatinine stable.  Follow renal function closely.    7.Anemia in chronic renal disease: Recent hemoglobin stable.  8. Hyperlipidemia: Continue statin.  9. Hypernatremia - He has a long hx of elevated Na levels.  I am not sure of the etiology.  With volume excess, this does not make much sense.  In any event, I would not aggressively hydrate him for Na levels close to his baseline.    10. Dementia - He has had progressive decline in his memory since I last saw him.  He may have a picture of failure to thrive as well.  He will likely need social work involvement prior to DC to help determine if he needs long term skilled nursing.  Previously he was a DNR. I will confirm code status with family.     Medication Adjustments/Labs  and Tests Ordered: Current medicines are reviewed at length with the patient today.  Concerns regarding medicines are outlined above.  Medication changes, Labs and Tests ordered today are outlined in the Patient Instructions noted below. Patient Instructions  YOU ARE BEING ADMITTED TO [facility]    Signed, Tereso Newcomer, PA-C  04/06/2016 12:32 PM    [facility] [address] [place], [place], [place]  [address] Phone: [phone]; Fax: [phone]

## 2016-04-05 NOTE — Telephone Encounter (Signed)
Medication was d/ced at [facility] discharge. Please advise if in need of refill.

## 2016-04-05 NOTE — Telephone Encounter (Signed)
Spoke with wife and she stated that she put patient back on medication regimen he was taking prior to recent admission Patient has been having increased lower extremity swelling since Friday, shortness of breath with exertion, and seems to be more confused Recommended going to ED but she would like to wait until tomorrow to see be seen in the office given recent hospitalization.  Per recent d/c summary patient does have dementia  Did schedule patient appointment for tomorrow with Bing Neighbors PA Advised to take to ED if worse  Stated she did try to call office earlier this morning but was not able to get an answer

## 2016-04-05 NOTE — Telephone Encounter (Signed)
Pt c/o swelling: STAT is pt has developed SOB within 24 hours  Pt wife is calling about pt health  1. How long have you been experiencing swelling? 3 days worse 04/04/16  2. Where is the swelling located? Legs ankles and feet  3.  Are you currently taking a "fluid pill"? yes 4.  Are you currently SOB? yes  5.  Have you traveled recently? no

## 2016-04-06 ENCOUNTER — Inpatient Hospital Stay (HOSPITAL_COMMUNITY)
Admission: AD | Admit: 2016-04-06 | Discharge: 2016-04-15 | DRG: 291 | Disposition: A | Discharge status: Home Health | Payer: Medicare Other | Source: Ambulatory Visit | Attending: Cardiovascular Disease | Admitting: Cardiovascular Disease

## 2016-04-06 ENCOUNTER — Encounter (HOSPITAL_COMMUNITY): Payer: Self-pay | Admitting: General Practice

## 2016-04-06 ENCOUNTER — Encounter: Payer: Self-pay | Admitting: Physician Assistant

## 2016-04-06 ENCOUNTER — Ambulatory Visit (INDEPENDENT_AMBULATORY_CARE_PROVIDER_SITE_OTHER): Payer: Medicare Other | Admitting: Physician Assistant

## 2016-04-06 VITALS — BP 92/60 | HR 92 | Ht 74.5 in | Wt 217.6 lb

## 2016-04-06 DIAGNOSIS — E870 Hyperosmolality and hypernatremia: Secondary | ICD-10-CM | POA: Diagnosis present

## 2016-04-06 DIAGNOSIS — E039 Hypothyroidism, unspecified: Secondary | ICD-10-CM | POA: Diagnosis present

## 2016-04-06 DIAGNOSIS — E1122 Type 2 diabetes mellitus with diabetic chronic kidney disease: Secondary | ICD-10-CM | POA: Diagnosis present

## 2016-04-06 DIAGNOSIS — Z9114 Patient's other noncompliance with medication regimen: Secondary | ICD-10-CM

## 2016-04-06 DIAGNOSIS — Z7951 Long term (current) use of inhaled steroids: Secondary | ICD-10-CM | POA: Diagnosis not present

## 2016-04-06 DIAGNOSIS — E876 Hypokalemia: Secondary | ICD-10-CM | POA: Diagnosis not present

## 2016-04-06 DIAGNOSIS — Z79899 Other long term (current) drug therapy: Secondary | ICD-10-CM

## 2016-04-06 DIAGNOSIS — I129 Hypertensive chronic kidney disease with stage 1 through stage 4 chronic kidney disease, or unspecified chronic kidney disease: Secondary | ICD-10-CM | POA: Diagnosis not present

## 2016-04-06 DIAGNOSIS — Z66 Do not resuscitate: Secondary | ICD-10-CM | POA: Diagnosis present

## 2016-04-06 DIAGNOSIS — I110 Hypertensive heart disease with heart failure: Secondary | ICD-10-CM | POA: Diagnosis not present

## 2016-04-06 DIAGNOSIS — I447 Left bundle-branch block, unspecified: Secondary | ICD-10-CM | POA: Diagnosis present

## 2016-04-06 DIAGNOSIS — I43 Cardiomyopathy in diseases classified elsewhere: Secondary | ICD-10-CM | POA: Diagnosis present

## 2016-04-06 DIAGNOSIS — Z823 Family history of stroke: Secondary | ICD-10-CM

## 2016-04-06 DIAGNOSIS — I130 Hypertensive heart and chronic kidney disease with heart failure and stage 1 through stage 4 chronic kidney disease, or unspecified chronic kidney disease: Secondary | ICD-10-CM | POA: Diagnosis not present

## 2016-04-06 DIAGNOSIS — I2510 Atherosclerotic heart disease of native coronary artery without angina pectoris: Secondary | ICD-10-CM | POA: Diagnosis not present

## 2016-04-06 DIAGNOSIS — I509 Heart failure, unspecified: Secondary | ICD-10-CM | POA: Diagnosis not present

## 2016-04-06 DIAGNOSIS — M109 Gout, unspecified: Secondary | ICD-10-CM | POA: Diagnosis present

## 2016-04-06 DIAGNOSIS — N183 Chronic kidney disease, stage 3 unspecified: Secondary | ICD-10-CM

## 2016-04-06 DIAGNOSIS — Z8673 Personal history of transient ischemic attack (TIA), and cerebral infarction without residual deficits: Secondary | ICD-10-CM | POA: Diagnosis not present

## 2016-04-06 DIAGNOSIS — E1140 Type 2 diabetes mellitus with diabetic neuropathy, unspecified: Secondary | ICD-10-CM | POA: Diagnosis present

## 2016-04-06 DIAGNOSIS — F0391 Unspecified dementia with behavioral disturbance: Secondary | ICD-10-CM | POA: Diagnosis present

## 2016-04-06 DIAGNOSIS — Z833 Family history of diabetes mellitus: Secondary | ICD-10-CM

## 2016-04-06 DIAGNOSIS — Z6826 Body mass index (BMI) 26.0-26.9, adult: Secondary | ICD-10-CM

## 2016-04-06 DIAGNOSIS — I480 Paroxysmal atrial fibrillation: Secondary | ICD-10-CM | POA: Diagnosis present

## 2016-04-06 DIAGNOSIS — L89151 Pressure ulcer of sacral region, stage 1: Secondary | ICD-10-CM

## 2016-04-06 DIAGNOSIS — Z95810 Presence of automatic (implantable) cardiac defibrillator: Secondary | ICD-10-CM | POA: Diagnosis not present

## 2016-04-06 DIAGNOSIS — K219 Gastro-esophageal reflux disease without esophagitis: Secondary | ICD-10-CM | POA: Diagnosis present

## 2016-04-06 DIAGNOSIS — D631 Anemia in chronic kidney disease: Secondary | ICD-10-CM | POA: Diagnosis present

## 2016-04-06 DIAGNOSIS — I5043 Acute on chronic combined systolic (congestive) and diastolic (congestive) heart failure: Secondary | ICD-10-CM | POA: Diagnosis present

## 2016-04-06 DIAGNOSIS — M1990 Unspecified osteoarthritis, unspecified site: Secondary | ICD-10-CM | POA: Diagnosis present

## 2016-04-06 DIAGNOSIS — I119 Hypertensive heart disease without heart failure: Secondary | ICD-10-CM

## 2016-04-06 DIAGNOSIS — G9340 Encephalopathy, unspecified: Secondary | ICD-10-CM

## 2016-04-06 DIAGNOSIS — E119 Type 2 diabetes mellitus without complications: Secondary | ICD-10-CM | POA: Diagnosis not present

## 2016-04-06 DIAGNOSIS — I482 Chronic atrial fibrillation, unspecified: Secondary | ICD-10-CM

## 2016-04-06 DIAGNOSIS — I428 Other cardiomyopathies: Secondary | ICD-10-CM

## 2016-04-06 DIAGNOSIS — F0390 Unspecified dementia without behavioral disturbance: Secondary | ICD-10-CM

## 2016-04-06 DIAGNOSIS — Z7901 Long term (current) use of anticoagulants: Secondary | ICD-10-CM | POA: Diagnosis not present

## 2016-04-06 DIAGNOSIS — F03918 Unspecified dementia, unspecified severity, with other behavioral disturbance: Secondary | ICD-10-CM | POA: Diagnosis present

## 2016-04-06 DIAGNOSIS — Z87891 Personal history of nicotine dependence: Secondary | ICD-10-CM | POA: Diagnosis not present

## 2016-04-06 DIAGNOSIS — E785 Hyperlipidemia, unspecified: Secondary | ICD-10-CM

## 2016-04-06 DIAGNOSIS — N189 Chronic kidney disease, unspecified: Secondary | ICD-10-CM | POA: Diagnosis not present

## 2016-04-06 DIAGNOSIS — I429 Cardiomyopathy, unspecified: Secondary | ICD-10-CM | POA: Diagnosis present

## 2016-04-06 DIAGNOSIS — N400 Enlarged prostate without lower urinary tract symptoms: Secondary | ICD-10-CM | POA: Diagnosis present

## 2016-04-06 DIAGNOSIS — F329 Major depressive disorder, single episode, unspecified: Secondary | ICD-10-CM | POA: Diagnosis present

## 2016-04-06 DIAGNOSIS — E1151 Type 2 diabetes mellitus with diabetic peripheral angiopathy without gangrene: Secondary | ICD-10-CM | POA: Diagnosis present

## 2016-04-06 DIAGNOSIS — Z5181 Encounter for therapeutic drug level monitoring: Secondary | ICD-10-CM | POA: Diagnosis not present

## 2016-04-06 DIAGNOSIS — Z8249 Family history of ischemic heart disease and other diseases of the circulatory system: Secondary | ICD-10-CM | POA: Diagnosis not present

## 2016-04-06 DIAGNOSIS — N184 Chronic kidney disease, stage 4 (severe): Secondary | ICD-10-CM

## 2016-04-06 DIAGNOSIS — E1129 Type 2 diabetes mellitus with other diabetic kidney complication: Secondary | ICD-10-CM | POA: Diagnosis present

## 2016-04-06 DIAGNOSIS — R0602 Shortness of breath: Secondary | ICD-10-CM | POA: Diagnosis not present

## 2016-04-06 DIAGNOSIS — I4891 Unspecified atrial fibrillation: Secondary | ICD-10-CM | POA: Diagnosis not present

## 2016-04-06 DIAGNOSIS — Z888 Allergy status to other drugs, medicaments and biological substances status: Secondary | ICD-10-CM

## 2016-04-06 DIAGNOSIS — I4892 Unspecified atrial flutter: Secondary | ICD-10-CM | POA: Diagnosis present

## 2016-04-06 DIAGNOSIS — I5042 Chronic combined systolic (congestive) and diastolic (congestive) heart failure: Secondary | ICD-10-CM

## 2016-04-06 HISTORY — DX: Dependence on supplemental oxygen: Z99.81

## 2016-04-06 HISTORY — DX: Cerebral infarction, unspecified: I63.9

## 2016-04-06 HISTORY — DX: Gout, unspecified: M10.9

## 2016-04-06 LAB — COMPREHENSIVE METABOLIC PANEL
ALT: 26 U/L (ref 17–63)
AST: 23 U/L (ref 15–41)
Albumin: 3 g/dL — ABNORMAL LOW (ref 3.5–5.0)
Alkaline Phosphatase: 112 U/L (ref 38–126)
Anion gap: 11 (ref 5–15)
BILIRUBIN TOTAL: 1 mg/dL (ref 0.3–1.2)
BUN: 37 mg/dL — AB (ref 6–20)
CO2: 20 mmol/L — ABNORMAL LOW (ref 22–32)
CREATININE: 2.92 mg/dL — AB (ref 0.61–1.24)
Calcium: 8.9 mg/dL (ref 8.9–10.3)
Chloride: 115 mmol/L — ABNORMAL HIGH (ref 101–111)
GFR calc Af Amer: 23 mL/min — ABNORMAL LOW (ref >60)
GFR, EST NON AFRICAN AMERICAN: 19 mL/min — AB (ref >60)
Glucose, Bld: 114 mg/dL — ABNORMAL HIGH (ref 65–99)
Potassium: 4.7 mmol/L (ref 3.5–5.1)
Sodium: 146 mmol/L — ABNORMAL HIGH (ref 135–145)
TOTAL PROTEIN: 5.2 g/dL — AB (ref 6.5–8.1)

## 2016-04-06 LAB — CBC WITH DIFFERENTIAL/PLATELET
Basophils Absolute: 0 10*3/uL (ref 0.0–0.1)
Basophils Relative: 0 %
EOS PCT: 1 %
Eosinophils Absolute: 0.1 10*3/uL (ref 0.0–0.7)
HEMATOCRIT: 32 % — AB (ref 39.0–52.0)
HEMOGLOBIN: 9.6 g/dL — AB (ref 13.0–17.0)
LYMPHS ABS: 0.7 10*3/uL (ref 0.7–4.0)
LYMPHS PCT: 9 %
MCH: 24.6 pg — ABNORMAL LOW (ref 26.0–34.0)
MCHC: 30 g/dL (ref 30.0–36.0)
MCV: 82.1 fL (ref 78.0–100.0)
MONOS PCT: 5 %
Monocytes Absolute: 0.4 10*3/uL (ref 0.1–1.0)
NEUTROS PCT: 85 %
Neutro Abs: 6.4 10*3/uL (ref 1.7–7.7)
Platelets: 178 10*3/uL (ref 150–400)
RBC: 3.9 MIL/uL — AB (ref 4.22–5.81)
RDW: 22.4 % — ABNORMAL HIGH (ref 11.5–15.5)
WBC: 7.6 10*3/uL (ref 4.0–10.5)

## 2016-04-06 LAB — PROTIME-INR
INR: 2.21 — ABNORMAL HIGH (ref 0.00–1.49)
Prothrombin Time: 24.3 seconds — ABNORMAL HIGH (ref 11.6–15.2)

## 2016-04-06 LAB — APTT: APTT: 40 s — AB (ref 24–37)

## 2016-04-06 MED ORDER — ISOSORBIDE MONONITRATE ER 30 MG PO TB24
30.0000 mg | ORAL_TABLET | Freq: Every day | ORAL | Status: DC
  Administered 2016-04-07 – 2016-04-15 (×9): 30 mg via ORAL
  Filled 2016-04-06 (×9): qty 1

## 2016-04-06 MED ORDER — POTASSIUM CHLORIDE CRYS ER 20 MEQ PO TBCR
40.0000 meq | EXTENDED_RELEASE_TABLET | Freq: Two times a day (BID) | ORAL | Status: DC
  Administered 2016-04-06 – 2016-04-15 (×19): 40 meq via ORAL
  Filled 2016-04-06 (×19): qty 2

## 2016-04-06 MED ORDER — SODIUM CHLORIDE 0.9% FLUSH
3.0000 mL | Freq: Two times a day (BID) | INTRAVENOUS | Status: DC
  Administered 2016-04-06 – 2016-04-14 (×15): 3 mL via INTRAVENOUS

## 2016-04-06 MED ORDER — ATORVASTATIN CALCIUM 20 MG PO TABS
20.0000 mg | ORAL_TABLET | Freq: Every day | ORAL | Status: DC
  Administered 2016-04-07 – 2016-04-15 (×9): 20 mg via ORAL
  Filled 2016-04-06 (×9): qty 1

## 2016-04-06 MED ORDER — ONDANSETRON HCL 4 MG/2ML IJ SOLN
4.0000 mg | Freq: Four times a day (QID) | INTRAMUSCULAR | Status: DC | PRN
  Administered 2016-04-08: 4 mg via INTRAVENOUS
  Filled 2016-04-06: qty 2

## 2016-04-06 MED ORDER — HYDRALAZINE HCL 25 MG PO TABS
25.0000 mg | ORAL_TABLET | Freq: Three times a day (TID) | ORAL | Status: DC
  Administered 2016-04-06 – 2016-04-15 (×27): 25 mg via ORAL
  Filled 2016-04-06 (×27): qty 1

## 2016-04-06 MED ORDER — TRIAMCINOLONE ACETONIDE 0.5 % EX CREA
TOPICAL_CREAM | Freq: Two times a day (BID) | CUTANEOUS | Status: DC
  Administered 2016-04-06 – 2016-04-13 (×13): via TOPICAL
  Administered 2016-04-13: 1 via TOPICAL
  Administered 2016-04-13 – 2016-04-15 (×4): via TOPICAL
  Filled 2016-04-06 (×2): qty 15

## 2016-04-06 MED ORDER — MOMETASONE FURO-FORMOTEROL FUM 200-5 MCG/ACT IN AERO
2.0000 | INHALATION_SPRAY | Freq: Two times a day (BID) | RESPIRATORY_TRACT | Status: DC
  Administered 2016-04-06 – 2016-04-15 (×15): 2 via RESPIRATORY_TRACT
  Filled 2016-04-06 (×2): qty 8.8

## 2016-04-06 MED ORDER — ESCITALOPRAM OXALATE 10 MG PO TABS
5.0000 mg | ORAL_TABLET | Freq: Every day | ORAL | Status: DC
  Administered 2016-04-07 – 2016-04-15 (×9): 5 mg via ORAL
  Filled 2016-04-06 (×9): qty 1

## 2016-04-06 MED ORDER — TAMSULOSIN HCL 0.4 MG PO CAPS
0.4000 mg | ORAL_CAPSULE | Freq: Every day | ORAL | Status: DC
  Administered 2016-04-07 – 2016-04-15 (×9): 0.4 mg via ORAL
  Filled 2016-04-06 (×9): qty 1

## 2016-04-06 MED ORDER — SODIUM CHLORIDE 0.9 % IV SOLN
250.0000 mL | INTRAVENOUS | Status: DC | PRN
  Administered 2016-04-12: 08:00:00 via INTRAVENOUS

## 2016-04-06 MED ORDER — ENSURE ENLIVE PO LIQD
237.0000 mL | Freq: Two times a day (BID) | ORAL | Status: DC
  Administered 2016-04-06 – 2016-04-15 (×18): 237 mL via ORAL

## 2016-04-06 MED ORDER — FUROSEMIDE 10 MG/ML IJ SOLN
40.0000 mg | Freq: Two times a day (BID) | INTRAMUSCULAR | Status: DC
  Administered 2016-04-06 – 2016-04-07 (×2): 40 mg via INTRAVENOUS
  Filled 2016-04-06 (×2): qty 4

## 2016-04-06 MED ORDER — WARFARIN SODIUM 5 MG PO TABS
5.0000 mg | ORAL_TABLET | ORAL | Status: DC
  Administered 2016-04-10 – 2016-04-11 (×2): 5 mg via ORAL
  Filled 2016-04-06 (×3): qty 1

## 2016-04-06 MED ORDER — WARFARIN - PHARMACIST DOSING INPATIENT
Freq: Every day | Status: DC
  Administered 2016-04-11 – 2016-04-14 (×3)

## 2016-04-06 MED ORDER — LEVOTHYROXINE SODIUM 75 MCG PO TABS
175.0000 ug | ORAL_TABLET | Freq: Every day | ORAL | Status: DC
  Administered 2016-04-07 – 2016-04-15 (×9): 175 ug via ORAL
  Filled 2016-04-06 (×9): qty 1

## 2016-04-06 MED ORDER — FERROUS SULFATE 325 (65 FE) MG PO TABS
325.0000 mg | ORAL_TABLET | Freq: Every day | ORAL | Status: DC
  Administered 2016-04-07 – 2016-04-15 (×9): 325 mg via ORAL
  Filled 2016-04-06 (×9): qty 1

## 2016-04-06 MED ORDER — WARFARIN SODIUM 5 MG PO TABS: 5.0000 mg | ORAL_TABLET | Freq: Every day | ORAL | Status: DC

## 2016-04-06 MED ORDER — WARFARIN SODIUM 2.5 MG PO TABS
2.5000 mg | ORAL_TABLET | ORAL | Status: DC
  Administered 2016-04-07 – 2016-04-12 (×3): 2.5 mg via ORAL
  Filled 2016-04-06 (×3): qty 1

## 2016-04-06 MED ORDER — ACETAMINOPHEN 325 MG PO TABS
650.0000 mg | ORAL_TABLET | ORAL | Status: DC | PRN
  Administered 2016-04-10: 650 mg via ORAL
  Filled 2016-04-06: qty 2

## 2016-04-06 MED ORDER — SODIUM CHLORIDE 0.9% FLUSH: 3.0000 mL | INTRAVENOUS | Status: DC | PRN

## 2016-04-06 MED ORDER — HYDROCODONE-ACETAMINOPHEN 5-325 MG PO TABS
1.0000 | ORAL_TABLET | Freq: Three times a day (TID) | ORAL | Status: DC | PRN
Start: 2016-04-06 — End: 2016-04-15

## 2016-04-06 NOTE — Progress Notes (Addendum)
ANTICOAGULATION CONSULT NOTE - Initial Consult  Pharmacy Consult for coumadin Indication: atrial fibrillation  Allergies  Allergen Reactions  . Levothyroxine Sodium Other (See Comments)    MUST TAKE BRAND NAME GENERIC DOESN'T WORK FOR PATIENT    Patient Measurements: Height: 6' 2.5" (189.2 cm) Weight: 212 lb 12.8 oz (96.525 kg) IBW/kg (Calculated) : 83.35   Vital Signs: Temp: 97.5 F (36.4 C) (04/18 1245) BP: 111/80 mmHg (04/18 1245) Pulse Rate: 94 (04/18 1245)  Labs: No results for input(s): HGB, HCT, PLT, APTT, LABPROT, INR, HEPARINUNFRC, HEPRLOWMOCWT, CREATININE, CKTOTAL, CKMB, TROPONINI in the last 72 hours.  Estimated Creatinine Clearance: 29.7 mL/min (by C-G formula based on Cr of 2.5).   Medical History: Past Medical History  Diagnosis Date  . Gout   . HTN (hypertension)   . Hyperlipidemia   . Osteoarthritis   . History of CVA (cerebrovascular accident)     Left pontine infarct July 2004; changed from Plavix to Coumadin in 2004 per MD at [facility]  . GERD (gastroesophageal reflux disease)   . DJD (degenerative joint disease)   . BPH (benign prostatic hypertrophy)   . Peripheral vascular disease (HCC)   . Bilateral leg ulcer (HCC)     ACHILLES AREA--  NONHEALING  . CKD (chronic kidney disease) stage 3, GFR 30-59 ml/min   . CAD (coronary artery disease)     a. LHC 4/12: Mid LAD 25%, mid diagonal 30%, AV circumflex 40%, proximal OM 25%, distal RCA 40%  . Chronic combined systolic and diastolic heart failure (HCC)     a. Echo 05/2013: EF 25-30%, diffuse HK, restrictive physiology, trivial AI, mild MR, moderate LAE, reduced RV systolic function, PASP 42  . LBBB (left bundle branch block)   . PAF (paroxysmal atrial fibrillation) (HCC)     a. amiodarone Rx started 05/2013;  b. chronic coumadin  . NICM (nonischemic cardiomyopathy) (HCC)     a. EF 25-30%.  [name] Hx of cardiovascular stress test     Nuclear Stress Test (1/16): High risk stress nuclear study with a large,  severe, partially reversible inferior and apical defect consistent with prior inferior and apical infarct; mild apical ischemia; severe LVE; study high risk due to reduced LV function.  EF 25% >> reviewed with Dr. Antoine Poche >> medical mgmt  . Hypothyroidism   . DM (diabetes mellitus), type 2 with renal complications (HCC)   . Presence of permanent cardiac pacemaker   . AICD (automatic cardioverter/defibrillator) present   . Stroke (HCC)   . Chronic combined systolic and diastolic CHF (congestive heart failure) (HCC)   . Shortness of breath dyspnea   . Pneumonia   . Chronic depression   . Hypernatremia   . Memory loss   . Anticoagulated on Coumadin   . Anemia in chronic renal disease   . Elevated troponin   . Hyperbilirubinemia   . Physical deconditioning   . Acute cystitis without hematuria   . Hypokalemia   . Anemia of chronic disease   . Loose stools   . Hyperkalemia   . Hypokalemia     Medications:  Prescriptions prior to admission  Medication Sig Dispense Refill Last Dose  . acetaminophen (TYLENOL) 325 MG tablet Take 1 tablet (325 mg total) by mouth every 6 (six) hours as needed for mild pain (or Fever >/= 101).   04/06/2016 at Unknown time  . atorvastatin (LIPITOR) 20 MG tablet Take 1 tablet (20 mg total) by mouth daily. 90 tablet 3 04/06/2016 at Unknown time  . carvedilol (  COREG) 6.25 MG tablet Take 1 tablet (6.25 mg total) by mouth 2 (two) times daily with a meal. 60 tablet 6 04/06/2016 at 6A  . Cholecalciferol (VITAMIN D-3 PO) Take 1,000 Units by mouth daily.   04/06/2016 at Unknown time  . escitalopram (LEXAPRO) 5 MG tablet TAKE 1 TABLET BY MOUTH EVERY DAY 30 tablet 3 04/06/2016 at Unknown time  . ferrous sulfate 325 (65 FE) MG tablet Take 1 tablet (325 mg total) by mouth daily. 30 tablet 11 04/06/2016 at Unknown time  . hydrALAZINE (APRESOLINE) 25 MG tablet Take 1 tablet (25 mg total) by mouth 3 (three) times daily.   04/06/2016 at 7A  . HYDROcodone-acetaminophen (NORCO/VICODIN)  5-325 MG tablet Take 1 tablet by mouth every 8 (eight) hours as needed for moderate pain. Do not exceed 4gm of Tylenol in 24 hours 90 tablet 0 04/06/2016 at Unknown time  . isosorbide mononitrate (IMDUR) 30 MG 24 hr tablet Take 1 tablet (30 mg total) by mouth 2 (two) times daily.   04/06/2016 at Unknown time  . levothyroxine (SYNTHROID, LEVOTHROID) 175 MCG tablet Take 175 mcg by mouth daily before breakfast.   04/06/2016 at Unknown time  . Menthol, Topical Analgesic, (BIOFREEZE) 4 % GEL Apply topically 2 (two) times daily. Apply to left shoulder and left ankle   04/06/2016 at Unknown time  . OXYGEN Inhale 2.5 L/min into the lungs See admin instructions. USES OXYGEN EVERY BEDTIME USES DURING DAY ONLY AS NEEDED FOR SHORTNESS OF BREATH   Past Week at Unknown time  . potassium chloride SA (K-DUR,KLOR-CON) 20 MEQ tablet Take 20 mEq by mouth daily.    04/06/2016 at Unknown time  . SYMBICORT 160-4.5 MCG/ACT inhaler INHALE 2 PUFFS INTO THE LUNGS 2 TIMES DAILY 10.2 Inhaler 5 04/06/2016 at Unknown time  . tamsulosin (FLOMAX) 0.4 MG CAPS capsule Take 1 capsule (0.4 mg total) by mouth daily. 30 capsule 1 04/06/2016 at Unknown time  . torsemide (DEMADEX) 10 MG tablet Take 10 mg by mouth daily.   04/06/2016 at Unknown time  . triamcinolone cream (KENALOG) 0.5 % Apply topically 2 (two) times daily. 60 g 2 04/06/2016 at Unknown time  . vitamin B-12 (CYANOCOBALAMIN) 1000 MCG tablet Take 1,000 mcg by mouth daily.   04/06/2016 at Unknown time  . warfarin (COUMADIN) 5 MG tablet Take 1 tablet by mouth daily or as directed by coumadin clinic. (Patient taking differently: Take 2.5-5 mg by mouth daily at 6 PM. 2.5 MG on Monday Wednesday  Friday. 5 mg all other days.) 30 tablet 0 04/05/2016 at 6p    Assessment: 75 yo M w/ chronic systolic CHF admitted with CC of leg swelling.  Pharmacy consulted to continue home coumadin for afib.  Home coumadin dose 2.5 mg on MWF and 5 mg all other days, last dose 04/05/16 at 6pm.  Wt 96.5 kg INR  2.21 = therapeutic  Goal of Therapy:  INR 2-3   Plan:  Continue home dose of 2.5 mg MWF and 5 mg all other days Daily INR  Herby Abraham, Pharm.D. [phone] 04/06/2016 2:36 PM

## 2016-04-06 NOTE — H&P (Signed)
Admission History and Physical Note:    Date:  04/05/2016   ID:  YNES WOLTERING, DOB 12/10/1941, MRN [id number]  PCP:  [name], MD  Cardiologist:  Dr. Rollene Rotunda   Electrophysiologist:  Dr. Lewayne Bunting  CHF: Dr. Marca Ancona    Chief Complaint  Patient presents with  . Leg Swelling    History of Present Illness:     ETHRIDGE FITTRO is a 75 y.o. 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  Echo 30-35%, grade 3 DD, mod MR, mod LAE, mod reduced RV function, severely dilated RA, PASP . He continued to have class III symptoms and eventually was referred to Dr. Laneta Simmers in 8/16 for consideration of LV epicardial lead placement. This was undertaken in 9/16 with revision of CRT system. Last seen by Dr. Ladona Ridgel in 12/16 was somewhat improved symptoms.  Admitted 3/14-3/19 with hypernatremia and altered mental status in the setting of dehydration and probable UTI and worsening dementia. Sodium was 155 upon admission and creatinine was 2.1. He was noted to have nonadherence to medications recently due to worsening dementia. He was hydrated with D5W. Sodium improved to 140 at discharge. He was discharged to [facility]. Discharged to home 03/31/16. Wife Called the office yesterday with increasing lower extremity edema and increasing confusion. She was advised to take him to the emergency room but decided to wait until today.  He is here with his wife.  It has been almost a year since I have seen him.  His functional status and memory has deteriorated quite a bit in that time.  His wife helps with the hx.  His weight is up ~ 6 lbs since leaving [facility]  several days ago.  He notes DOE with minimal activity. LE edema is worse as well as abdominal bloating. He has been wheezing.  Denies orthopnea, PND.  Denies chest pain. He is not eating well at all.  She is not sure if he takes his medications.  Since leaving [facility], she has been giving him Torsemide 10 mg alt with 20 mg every other day along with K+ 30 mEq bid.     Past Medical History  Diagnosis Date  . Gout   . HTN (hypertension)   . Hyperlipidemia   . Osteoarthritis   . History of CVA (cerebrovascular accident)     Left pontine infarct July 2004; changed from Plavix to Coumadin in 2004 per MD at [facility]  . GERD (gastroesophageal reflux disease)   . DJD (degenerative joint disease)   . BPH (benign prostatic hypertrophy)   . Peripheral vascular disease (HCC)   . Bilateral leg ulcer (HCC)     ACHILLES AREA--  NONHEALING  . CKD (chronic kidney disease) stage 3, GFR 30-59 ml/min   . CAD (coronary artery disease)     a. LHC 4/12: Mid LAD 25%, mid diagonal 30%, AV circumflex 40%, proximal OM 25%, distal RCA 40%  . Chronic combined systolic and diastolic heart failure (HCC)     a. Echo 05/2013: EF 25-30%, diffuse HK, restrictive physiology, trivial AI, mild MR, moderate LAE, reduced RV systolic function, PASP 42  . LBBB (left bundle branch block)   . PAF (paroxysmal  atrial fibrillation) (HCC)     a. amiodarone Rx started 05/2013;  b. chronic coumadin  . NICM (nonischemic cardiomyopathy) (HCC)     a. EF 25-30%.  [name] Hx of cardiovascular stress test     Nuclear Stress Test (1/16): High risk stress nuclear study with a large, severe, partially reversible inferior and apical defect consistent with prior inferior and apical infarct; mild apical ischemia; severe LVE; study high risk due to reduced LV function.  EF 25% >> reviewed with Dr. Antoine Poche >> medical mgmt  . Hypothyroidism   . DM (diabetes mellitus), type 2 with renal complications (HCC)   . Presence of permanent cardiac pacemaker     . AICD (automatic cardioverter/defibrillator) present   . Stroke (HCC)   . Chronic combined systolic and diastolic CHF (congestive heart failure) (HCC)   . Shortness of breath dyspnea   . Pneumonia   . Chronic depression   . Hypernatremia   . Memory loss   . Anticoagulated on Coumadin   . Anemia in chronic renal disease   . Elevated troponin   . Hyperbilirubinemia   . Physical deconditioning   . Acute cystitis without hematuria   . Hypokalemia   . Anemia of chronic disease   . Loose stools   . Hyperkalemia   . Hypokalemia     Past Surgical History  Procedure Laterality Date  . Cardiac catheterization  04-16-2011   DR [facility]    NON-OBSTRUCTIVE CAD. MILDLY ELEVATED PULMONARY PRESSURES/ ELEVATED  END-DIASTOLIC PRESSURE  . Transthoracic echocardiogram  12-31-2010    MODERATE CONCENTRIC LVH/ SYSTOLIC FUNCTION SEVERELY REDUCED/ EF 25-30%/  SEVERE HYPOKINESIS OF ANTEROSEPTAL MYOCARDIUM  AND ENTIREAPICAL MYOCARDIUM /  MODERATE HYPOKINESIS OF LATERAL, INFEROLATERAL, INFERIOR,AND INFEROSEPTAL MYOCARIUM/  GRADE 3 DIASTOLIC DYSFUNCTION/ MILD MR  . Aortogram w/ bilateral lower extremitiy runoff  05-18-2013  DR FIELDS    LEFT LEG OCCLUDED PERONEAL AND ANTERIOR TIBIAL ARTERIES/ HIGH GRADE STENOIS 80% MIDDLE AND DISTAL THIRD OF POSTERIOR TIBIAL ARTERY/ RIGHT PERONEAL AND ANTERIOR TIBIAL ARTERY OCCLUDED/ 40% STENOSIS DISTALLY   . Abdominal aortagram N/A 05/18/2013    Procedure: ABDOMINAL Ronny Flurry;  Surgeon: Sherren Kerns, MD;  Location: [facility] CATH LAB;  Service: Cardiovascular;  Laterality: N/A;  . Lower extremity angiogram Bilateral 05/18/2013    Procedure: LOWER EXTREMITY ANGIOGRAM;  Surgeon: Sherren Kerns, MD;  Location: [facility] CATH LAB;  Service: Cardiovascular;  Laterality: Bilateral;  . Right heart catheterization N/A 02/13/2015    Procedure: RIGHT HEART CATH;  Surgeon: Laurey Morale, MD;  Location: [facility] CATH LAB;  Service: Cardiovascular;  Laterality: N/A;  . Bi-ventricular implantable  cardioverter defibrillator N/A 03/12/2015    Procedure: BI-VENTRICULAR IMPLANTABLE CARDIOVERTER DEFIBRILLATOR  (CRT-D);  Surgeon: Marinus Maw, MD;  Location: [facility] CATH LAB;  Service: Cardiovascular;  Laterality: N/A;  . Epicardial pacing lead placement N/A 08/21/2015    Procedure: EPICARDIAL PACING LEAD PLACEMENT;  Surgeon: Alleen Borne, MD;  Location: [facility] OR;  Service: Thoracic;  Laterality: N/A;  . Thoracotomy Left 08/21/2015    Procedure: MINI/LIMITED THORACOTOMY;  Surgeon: Alleen Borne, MD;  Location: [facility] OR;  Service: Thoracic;  Laterality: Left;    Current Medications: Outpatient Prescriptions Prior to Visit  Medication Sig Dispense Refill  . acetaminophen (TYLENOL) 325 MG tablet Take 1 tablet (325 mg total) by mouth every 6 (six) hours as needed for mild pain (or Fever >/= 101).    [name] atorvastatin (LIPITOR) 20 MG tablet Take 1 tablet (20 mg total) by mouth daily. 90 tablet 3  .  carvedilol (COREG) 6.25 MG tablet Take 1 tablet (6.25 mg total) by mouth 2 (two) times daily with a meal. 60 tablet 6  . Cholecalciferol (VITAMIN D-3 PO) Take 1,000 Units by mouth daily.    [name] escitalopram (LEXAPRO) 5 MG tablet TAKE 1 TABLET BY MOUTH EVERY DAY 30 tablet 3  . ferrous sulfate 325 (65 FE) MG tablet Take 1 tablet (325 mg total) by mouth daily. 30 tablet 11  . hydrALAZINE (APRESOLINE) 25 MG tablet Take 1 tablet (25 mg total) by mouth 3 (three) times daily.    [name] HYDROcodone-acetaminophen (NORCO/VICODIN) 5-325 MG tablet Take 1 tablet by mouth every 8 (eight) hours as needed for moderate pain. Do not exceed 4gm of Tylenol in 24 hours 90 tablet 0  . isosorbide mononitrate (IMDUR) 30 MG 24 hr tablet Take 1 tablet (30 mg total) by mouth 2 (two) times daily.    [name] levothyroxine (SYNTHROID, LEVOTHROID) 175 MCG tablet Take 175 mcg by mouth daily before breakfast.    . Menthol, Topical Analgesic, (BIOFREEZE) 4 % GEL Apply topically 2 (two) times daily. Apply to left shoulder and left ankle    . OXYGEN Inhale 2.5  L/min into the lungs See admin instructions. USES OXYGEN EVERY BEDTIME USES DURING DAY ONLY AS NEEDED    . potassium chloride SA (K-DUR,KLOR-CON) 20 MEQ tablet Take 20 mEq by mouth daily.     . SYMBICORT 160-4.5 MCG/ACT inhaler INHALE 2 PUFFS INTO THE LUNGS 2 TIMES DAILY 10.2 Inhaler 5  . tamsulosin (FLOMAX) 0.4 MG CAPS capsule Take 1 capsule (0.4 mg total) by mouth daily. 30 capsule 1  . torsemide (DEMADEX) 10 MG tablet Take 10 mg by mouth daily.    [name] triamcinolone cream (KENALOG) 0.5 % Apply topically 2 (two) times daily. 60 g 2  . vitamin B-12 (CYANOCOBALAMIN) 1000 MCG tablet Take 1,000 mcg by mouth daily.    [name] warfarin (COUMADIN) 5 MG tablet Take 1 tablet by mouth daily or as directed by coumadin clinic. 30 tablet 0   No facility-administered medications prior to visit.     Allergies:   Levothyroxine sodium   Social History   Social History  . Marital Status: Married    Spouse Name: N/A  . Number of Children: N/A  . Years of Education: N/A   Occupational History  . Retired - Working in Product manager    Social History Main Topics  . Smoking status: Former Smoker    Quit date: 04/12/1974  . Smokeless tobacco: Never Used  . Alcohol Use: No     Comment: previously drank - "love cognac" quit 15 yrs ago.  . Drug Use: No  . Sexual Activity: No   Other Topics Concern  . Not on file   Social History Narrative   Worked at [employer] year.  He is married and lives with his wife [name] in [place].  Has one daughter Lowella Bandy     Family History:  [name] family history includes Diabetes in his mother; Gout in his other; Heart attack in his maternal grandmother; Heart disease in his father and mother; Hypertension in his father and mother; Stroke in his other. There is no history of Thyroid disease.   ROS:   Please see the history of present illness.    Review of Systems  Constitution: Positive for decreased appetite and weight gain.  Cardiovascular: Positive for  dyspnea on exertion and leg swelling.  Respiratory: Positive for shortness of breath and wheezing.   Musculoskeletal: Positive  for back pain.  Psychiatric/Behavioral: Positive for depression. The patient is nervous/anxious.    All other systems reviewed and are negative.   Physical Exam:    VS:  There were no vitals taken for this visit.   GEN: Well nourished, well developed, in no acute distress HEENT: normal Neck: + JVD at 90 degrees, no masses Cardiac: Normal S1/S2, irreg irreg rhythm; no murmurs, rubs, or gallops, 1-2+ bilat LE edema to his knees;     Respiratory:  clear to auscultation bilaterally; no wheezing, rhonchi or rales GI: soft, + pitting edema lower abdomen MS: + presacral edema Skin: warm and dry Neuro: No focal deficits  Psych: Flat affect  Wt Readings from Last 3 Encounters:  04/01/16 211 lb (95.709 kg)  03/30/16 206 lb (93.441 kg)  03/24/16 206 lb (93.441 kg)      Studies/Labs Reviewed:     EKG:  EKG is  ordered today.  The ekg ordered today demonstrates V paced, probable AFib, HR 92  Recent Labs: 06/20/2015: Pro B Natriuretic peptide (BNP) 539.0* 03/03/2016: B Natriuretic Peptide 1253.1* 03/05/2016: Magnesium 2.0 03/11/2016: Hemoglobin 9.4*; Platelets 259 03/15/2016: TSH 0.15* 03/19/2016: ALT 96* 03/31/2016: BUN 36*; Creatinine 2.5*; Potassium 3.7; Sodium 147   Recent Lipid Panel    Component Value Date/Time   CHOL 97 03/19/2016   TRIG 101 03/19/2016   TRIG 89 12/21/2006 1117   HDL 25* 03/19/2016   CHOLHDL 5 06/24/2014 1139   CHOLHDL 3.8 CALC 12/21/2006 1117   VLDL 26.6 06/24/2014 1139   LDLCALC 52 03/19/2016   LDLDIRECT 177.9 06/17/2010 0909    Additional studies/ records that were reviewed today include:    Echo 02/26/15 - MildLVH. EF 30% to 35%.Grade 3diastolic dysfunction. - Aortic valve: There was trivial regurgitation. - Mitral valve: There was moderate regurgitation. - Left atrium: The atrium was moderately dilated. - Right  ventricle: The cavity size was moderately dilated. Systolicfunction was moderately reduced. - Right atrium: The atrium was severely dilated. - Pulmonary arteries: Systolic pressure was moderately increased. PA peak pressure: 54 mm Hg (S).  Myoview 01/04/15 High risk stress nuclear study with a large, severe, partially reversible inferior and apical defect consistent with prior inferior and apical infarct; mild apical ischemia; severe LVE; study high risk due to reduced LV function. LV Ejection Fraction: 25%. LV Wall Motion: Global hypokinesis. (med Rx recommended)    Problem List: Principal Problem:   Acute on chronic combined systolic and diastolic CHF (congestive heart failure) (HCC) Active Problems:   HLD (hyperlipidemia)   CKD (chronic kidney disease) stage 3, GFR 30-59 ml/min   CAD (coronary artery disease)   NICM (nonischemic cardiomyopathy) (HCC)   Hypernatremia   Anemia in chronic renal disease   Chronic atrial fibrillation (HCC)   ICD (implantable cardioverter-defibrillator), biventricular, in situ   Dementia    ASSESSMENT AND PLAN:      1. A/C combined systolic and diastolic CHF (congestive heart failure):  He is volume overloaded with some evidence of anasarca.  Of note, his Na was 147 prior to DC from SNF last week and he was given IVFs.  His weight is up about 11 lbs since last week.  He has a poor appetite and is not eating very well.  I discussed adjusting diuretics at home with close FU vs admission for IV diuresis.  His wife is his main caregiver.  She is very concerned about caring for him at home.  She prefers he be admitted to the hospital.  I reviewed this  with Dr. Delane Ginger (DOD) who also saw him.  We will admit him to [facility] today for IV diuresis.    -  Lasix 40 mg IV bid  -  K+ 40 mEq bid  -  Decrease Imdur to 30 mg QD given low BP  -  Follow renal function and K+ closely  -  Consider having HF team see him as well.  This may be a picture of low  output (low BP, poor appetite).  -  No Echo since 3/16.  Will get FU echo to reassess LVEF.   -  May benefit from HF Clinic FU post DC.    2.NICM (nonischemic cardiomyopathy): He has not been on his Carvedilol since leaving the SNF.  Will continue hydralazine, nitrates. He is not on ACE inhibitor or ARB secondary to chronic kidney disease.  Will try to get him back on beta-blocker therapy at some point before DC.    3.Chronic AF (paroxysmal atrial fibrillation): He has had 100% AF since 12/16.  Continue Coumadin.  Will have pharmacy manage INR in the hospital.  Will need to make sure he gets appropriate coumadin clinic FU after DC.  He was taken off Amiodarone in 2016 due to hypothyroidism.      4.Coronary artery disease - Hx of non-obs CAD by LHC in the past.   No angina. He is not on aspirin as he is on Coumadin. Continue statin, nitrates.  5.s/p CRT-D - He had some improvement after his epicardial LV lead was placed.  As noted, repeat Echo will be obtained.  FU with EP after DC as planned.   6.CKD - Recent Creatinine stable.  Follow renal function closely.    7.Anemia in chronic renal disease: Recent hemoglobin stable.  8. Hyperlipidemia: Continue statin.  9. Hypernatremia - He has a long hx of elevated Na levels.  I am not sure of the etiology.  With volume excess, this does not make much sense.  In any event, I would not aggressively hydrate him for Na levels close to his baseline.    10. Dementia - He has had progressive decline in his memory since I last saw him.  He may have a picture of failure to thrive as well.  He will likely need social work involvement prior to DC to help determine if he needs long term skilled nursing.  Previously he was a DNR.  I will clarify code status with the family.    Signed, Tereso Newcomer, PA-C  04/05/2016 11:07 PM    [facility] [address] [place], [place], [place]  [address] Phone: [phone]; Fax: [phone]    Attending Note:   The patient was seen and examined.  Agree with assessment and plan as noted above.  Changes made to the above note as needed.  Pt has signs and symptoms of CHF.   Has significant edema in legs Lungs are relatively clear BP is marginal at best Our best option to to admit him to the hospital for IV lasix His dementia is creating a significant hurdle in his care.   His wife is very caring and is doing the best that she can   Alvia Grove., MD, [facility] 04/06/2016, 9:10 PM [address],  [address] Office [phone] Pager [phone]

## 2016-04-06 NOTE — Patient Instructions (Signed)
YOU ARE BEING ADMITTED TO [facility]  

## 2016-04-07 ENCOUNTER — Ambulatory Visit: Payer: Medicare Other | Admitting: Internal Medicine

## 2016-04-07 ENCOUNTER — Inpatient Hospital Stay (HOSPITAL_COMMUNITY): Payer: Medicare Other

## 2016-04-07 DIAGNOSIS — E785 Hyperlipidemia, unspecified: Secondary | ICD-10-CM

## 2016-04-07 DIAGNOSIS — N183 Chronic kidney disease, stage 3 (moderate): Secondary | ICD-10-CM

## 2016-04-07 DIAGNOSIS — I2510 Atherosclerotic heart disease of native coronary artery without angina pectoris: Secondary | ICD-10-CM

## 2016-04-07 DIAGNOSIS — Z95810 Presence of automatic (implantable) cardiac defibrillator: Secondary | ICD-10-CM

## 2016-04-07 DIAGNOSIS — L8990 Pressure ulcer of unspecified site, unspecified stage: Secondary | ICD-10-CM | POA: Insufficient documentation

## 2016-04-07 LAB — BASIC METABOLIC PANEL
Anion gap: 12 (ref 5–15)
BUN: 35 mg/dL — AB (ref 6–20)
CHLORIDE: 114 mmol/L — AB (ref 101–111)
CO2: 22 mmol/L (ref 22–32)
CREATININE: 2.72 mg/dL — AB (ref 0.61–1.24)
Calcium: 9 mg/dL (ref 8.9–10.3)
GFR calc Af Amer: 25 mL/min — ABNORMAL LOW (ref >60)
GFR calc non Af Amer: 21 mL/min — ABNORMAL LOW (ref >60)
GLUCOSE: 83 mg/dL (ref 65–99)
POTASSIUM: 4.3 mmol/L (ref 3.5–5.1)
Sodium: 148 mmol/L — ABNORMAL HIGH (ref 135–145)

## 2016-04-07 LAB — PROTIME-INR
INR: 2.28 — ABNORMAL HIGH (ref 0.00–1.49)
Prothrombin Time: 24.9 seconds — ABNORMAL HIGH (ref 11.6–15.2)

## 2016-04-07 MED ORDER — WARFARIN SODIUM 5 MG PO TABS
5.0000 mg | ORAL_TABLET | Freq: Once | ORAL | Status: AC
  Administered 2016-04-07: 5 mg via ORAL
  Filled 2016-04-07: qty 1

## 2016-04-07 MED ORDER — FUROSEMIDE 10 MG/ML IJ SOLN
80.0000 mg | Freq: Two times a day (BID) | INTRAMUSCULAR | Status: DC
Start: 2016-04-07 — End: 2016-04-09
  Administered 2016-04-07 – 2016-04-09 (×4): 80 mg via INTRAVENOUS
  Filled 2016-04-07 (×4): qty 8

## 2016-04-07 NOTE — Progress Notes (Signed)
Pt a/o, no c/o pain, VSS, pt stable, pt resting comfortably 

## 2016-04-07 NOTE — Progress Notes (Signed)
Initial Nutrition Assessment   INTERVENTION:  Continue Ensure Enlive po BID, each supplement provides 350 kcal and 20 grams of protein Encourage adequate PO intake   NUTRITION DIAGNOSIS:   Inadequate oral intake related to poor appetite as evidenced by per patient/family report, mild depletion of body fat, moderate depletions of muscle mass.   GOAL:   Patient will meet greater than or equal to 90% of their needs   MONITOR:   PO intake, Labs, Skin, Weight trends, I & O's  REASON FOR ASSESSMENT:   Malnutrition Screening Tool    ASSESSMENT:   75 y.o. male with a hx of chronic systolic HF felt secondary to hypertension, paroxysmal atrial fibrillation, DM, stroke/TIA, peripheral neuropathy, HLD, CKD stage III, LBBB, and hypothyroidism,.Wife Called the office yesterday with increasing lower extremity edema and increasing confusion.  Pt states that food has not taste and he has been eating less than usual. He reports losing 20 lbs, but also states that he lost from 218 lbs to 208 lbs. No evidence of recent weight loss per weight history. He does have moderate muscle wasting and mild fat wasting per nutrition focused physical exam signifying possible weight loss. He reports eating 50% of breakfast. He likes Ensure and would like to receive it daily.   Labs: elevated sodium, elevated creatinine/BUN, low GFR, low hemoglobin, elevated INR  Diet Order:  Diet Heart Room service appropriate?: Yes; Fluid consistency:: Thin  Skin:  Wound (see comment) (stage II pressure ulcer on buttocks)  Last BM:  4/18  Height:   Ht Readings from Last 1 Encounters:  04/06/16 6' 2.5" (1.892 m)    Weight:   Wt Readings from Last 1 Encounters:  04/07/16 211 lb 3.2 oz (95.8 kg)    Ideal Body Weight:  87.7 kg  BMI:  Body mass index is 26.76 kg/(m^2).  Estimated Nutritional Needs:   Kcal:  2100-2300  Protein:  110-130 grams  Fluid:  2.1-2.3 L/day  EDUCATION NEEDS:   No education needs  identified at this time  Dorothea Ogle RD, LDN Inpatient Clinical Dietitian Pager: [phone] After Hours Pager: [phone]

## 2016-04-07 NOTE — Care Management Note (Addendum)
Case Management Note  Patient Details  Name: KVON TRUNDY MRN: [id number] Date of Birth: 1941/10/03  Subjective/Objective:        Pt admitted with acute on chronic HF            Action/Plan:   Pt had been discharged from [facility] less than a week prior to this admit, CSW tentatively consulted  Pt is from home with wife, active with [facility] and PT, CM contacted agency to inform of admit.  CM will request resumption orders prior to discharge.  Pt  was on home O2 2-4 liters.  Pt has scale and home and was educated on importance of daily weights and low sodium diet.  CM will continue to monitor for disposition needs   Expected Discharge Date:                  Expected Discharge Plan:  Home w Home Health Services  In-House Referral:  NA  Discharge planning Services  CM Consult  Post Acute Care Choice:  Home Health Choice offered to:  Patient  DME Arranged:  N/A DME Agency:  NA  HH Arranged:  RN, PT HH Agency:  [facility]  Status of Service:  In process, will continue to follow  Medicare Important Message Given:    Date Medicare IM Given:    Medicare IM give by:    Date Additional Medicare IM Given:    Additional Medicare Important Message give by:     If discussed at Long Length of Stay Meetings, dates discussed:    Additional Comments:  Cherylann Parr, RN 04/07/2016, 10:15 AM

## 2016-04-07 NOTE — Progress Notes (Signed)
ANTICOAGULATION CONSULT NOTE - Initial Consult  Pharmacy Consult for coumadin Indication: atrial fibrillation  Allergies  Allergen Reactions  . Levothyroxine Sodium Other (See Comments)    MUST TAKE BRAND NAME GENERIC DOESN'T WORK FOR PATIENT    Patient Measurements: Height: 6' 2.5" (189.2 cm) Weight: 211 lb 3.2 oz (95.8 kg) IBW/kg (Calculated) : 83.35   Vital Signs: Temp: 97.9 F (36.6 C) (04/19 0500) Temp Source: Oral (04/19 0500) BP: 122/97 mmHg (04/19 0500) Pulse Rate: 65 (04/19 0500)  Labs:  Recent Labs  04/06/16 1503 04/07/16 0251  HGB 9.6*  --   HCT 32.0*  --   PLT 178  --   APTT 40*  --   LABPROT 24.3* 24.9*  INR 2.21* 2.28*  CREATININE 2.92* 2.72*    Estimated Creatinine Clearance: 27.3 mL/min (by C-G formula based on Cr of 2.72).   Medical History: Past Medical History  Diagnosis Date  . Gout   . HTN (hypertension)   . Hyperlipidemia   . Osteoarthritis   . GERD (gastroesophageal reflux disease)   . DJD (degenerative joint disease)   . BPH (benign prostatic hypertrophy)   . Peripheral vascular disease (HCC)   . Bilateral leg ulcer (HCC)     ACHILLES AREA--  NONHEALING  . CKD (chronic kidney disease) stage 3, GFR 30-59 ml/min   . CAD (coronary artery disease)     a. LHC 4/12: Mid LAD 25%, mid diagonal 30%, AV circumflex 40%, proximal OM 25%, distal RCA 40%  . Chronic combined systolic and diastolic heart failure (HCC)     a. Echo 05/2013: EF 25-30%, diffuse HK, restrictive physiology, trivial AI, mild MR, moderate LAE, reduced RV systolic function, PASP 42  . LBBB (left bundle branch block)   . PAF (paroxysmal atrial fibrillation) (HCC)     a. amiodarone Rx started 05/2013;  b. chronic coumadin  . NICM (nonischemic cardiomyopathy) (HCC)     a. EF 25-30%.  [name] Hx of cardiovascular stress test     Nuclear Stress Test (1/16): High risk stress nuclear study with a large, severe, partially reversible inferior and apical defect consistent with prior  inferior and apical infarct; mild apical ischemia; severe LVE; study high risk due to reduced LV function.  EF 25% >> reviewed with Dr. Antoine Poche >> medical mgmt  . Hypothyroidism   . Presence of permanent cardiac pacemaker   . AICD (automatic cardioverter/defibrillator) present   . Chronic combined systolic and diastolic CHF (congestive heart failure) (HCC)   . Shortness of breath dyspnea   . Pneumonia   . Chronic depression   . Hypernatremia   . Memory loss   . Anticoagulated on Coumadin   . Anemia in chronic renal disease   . Elevated troponin   . Hyperbilirubinemia   . Physical deconditioning   . Acute cystitis without hematuria   . Hypokalemia   . Anemia of chronic disease   . Loose stools   . Hyperkalemia   . Hypokalemia   . On home oxygen therapy     "2L at night" (04/06/2016)  . CVA (cerebral vascular accident) [facility])     Left pontine infarct July 2004; changed from Plavix to Coumadin in 2004 per MD at [facility]  . DM (diabetes mellitus), type 2 with renal complications (HCC)   . Gout     Medications:  Prescriptions prior to admission  Medication Sig Dispense Refill Last Dose  . acetaminophen (TYLENOL) 325 MG tablet Take 1 tablet (325 mg total) by mouth every 6 (  six) hours as needed for mild pain (or Fever >/= 101).   04/06/2016 at Unknown time  . atorvastatin (LIPITOR) 20 MG tablet Take 1 tablet (20 mg total) by mouth daily. 90 tablet 3 04/06/2016 at Unknown time  . carvedilol (COREG) 6.25 MG tablet Take 1 tablet (6.25 mg total) by mouth 2 (two) times daily with a meal. 60 tablet 6 04/06/2016 at 6A  . Cholecalciferol (VITAMIN D-3 PO) Take 1,000 Units by mouth daily.   04/06/2016 at Unknown time  . escitalopram (LEXAPRO) 5 MG tablet TAKE 1 TABLET BY MOUTH EVERY DAY 30 tablet 3 04/06/2016 at Unknown time  . ferrous sulfate 325 (65 FE) MG tablet Take 1 tablet (325 mg total) by mouth daily. 30 tablet 11 04/06/2016 at Unknown time  . hydrALAZINE (APRESOLINE) 25 MG tablet Take 1 tablet  (25 mg total) by mouth 3 (three) times daily.   04/06/2016 at 7A  . HYDROcodone-acetaminophen (NORCO/VICODIN) 5-325 MG tablet Take 1 tablet by mouth every 8 (eight) hours as needed for moderate pain. Do not exceed 4gm of Tylenol in 24 hours 90 tablet 0 04/06/2016 at Unknown time  . isosorbide mononitrate (IMDUR) 30 MG 24 hr tablet Take 1 tablet (30 mg total) by mouth 2 (two) times daily.   04/06/2016 at Unknown time  . levothyroxine (SYNTHROID, LEVOTHROID) 175 MCG tablet Take 175 mcg by mouth daily before breakfast.   04/06/2016 at Unknown time  . Menthol, Topical Analgesic, (BIOFREEZE) 4 % GEL Apply topically 2 (two) times daily. Apply to left shoulder and left ankle   04/06/2016 at Unknown time  . OXYGEN Inhale 2.5 L/min into the lungs See admin instructions. USES OXYGEN EVERY BEDTIME USES DURING DAY ONLY AS NEEDED FOR SHORTNESS OF BREATH   Past Week at Unknown time  . potassium chloride SA (K-DUR,KLOR-CON) 20 MEQ tablet Take 20 mEq by mouth daily.    04/06/2016 at Unknown time  . SYMBICORT 160-4.5 MCG/ACT inhaler INHALE 2 PUFFS INTO THE LUNGS 2 TIMES DAILY 10.2 Inhaler 5 04/06/2016 at Unknown time  . tamsulosin (FLOMAX) 0.4 MG CAPS capsule Take 1 capsule (0.4 mg total) by mouth daily. 30 capsule 1 04/06/2016 at Unknown time  . torsemide (DEMADEX) 10 MG tablet Take 10 mg by mouth daily.   04/06/2016 at Unknown time  . triamcinolone cream (KENALOG) 0.5 % Apply topically 2 (two) times daily. 60 g 2 04/06/2016 at Unknown time  . vitamin B-12 (CYANOCOBALAMIN) 1000 MCG tablet Take 1,000 mcg by mouth daily.   04/06/2016 at Unknown time  . warfarin (COUMADIN) 5 MG tablet Take 1 tablet by mouth daily or as directed by coumadin clinic. (Patient taking differently: Take 2.5-5 mg by mouth daily at 6 PM. 2.5 MG on Monday Wednesday  Friday. 5 mg all other days.) 30 tablet 0 04/05/2016 at 6p    Assessment: 75 yo M w/ chronic systolic CHF admitted with CC of leg swelling. Pharmacy consulted to continue home coumadin for  afib. On Coumadin 5mg  daily exc 2.5mg  on MWF PTA for Afib. Last dose was 4/17 and INR on admit was therapeutic at 2.21. Yesterday's dose was given late. INR today remains therapeutic at 2.28. Hgb low at 9.6, plts wnl. No s/s of bleed.  Goal of Therapy:  INR 2-3   Plan:  Continue PTA coumadin dose of 5mg  daily exc 2.5mg  on MWF Monitor daily INR, CBC, s/s of bleed  Enzo Bi, PharmD, [facility] Clinical Pharmacist Pager [phone] 04/07/2016 8:45 AM

## 2016-04-07 NOTE — Progress Notes (Signed)
PATIENT ID: Frank Mclean is a 44M with hyperlipidemia, s/p BiV ICD, paroxysmal atrial fibrillation, non-obstructive CAD, hypertension, hyperlipidemia, diabetes, prior stroke, LBBB, dementia and CKD stage 3 here with acute on chronic systolic and diastolic heart failure.  SUBJECTIVE:  Feeling better.  Less short of breath.  He was able to sleep well last night.     PHYSICAL EXAM Filed Vitals:   04/06/16 2139 04/07/16 0121 04/07/16 0500 04/07/16 0737  BP: 121/85 123/90 122/97   Pulse: 104 90 65   Temp: 97.9 F (36.6 C) 98.2 F (36.8 C) 97.9 F (36.6 C)   TempSrc: Oral Oral Oral   Resp: 20 20 20    Height:      Weight:   95.8 kg (211 lb 3.2 oz)   SpO2: 100% 100% 95% 95%   General:  Well-appearing.  No acute distress.  Neck: No JVD Lungs:  CTAB.  No crackles, rhonchi or wheezes Heart:  RRR.  No m/r/g.  Normal S1/S2 Abdomen:  Soft, NT, ND.  +BS.   Extremities:  WWP. 2+ pitting edema to the knee bilaterally.   LABS: Lab Results  Component Value Date   TROPONINI 0.21* 03/04/2016   Results for orders placed or performed during the hospital encounter of 04/06/16 (from the past 24 hour(s))  CBC WITH DIFFERENTIAL     Status: Abnormal   Collection Time: 04/06/16  3:03 PM  Result Value Ref Range   WBC 7.6 4.0 - 10.5 K/uL   RBC 3.90 (L) 4.22 - 5.81 MIL/uL   Hemoglobin 9.6 (L) 13.0 - 17.0 g/dL   HCT 40.9 (L) 81.1 - 91.4 %   MCV 82.1 78.0 - 100.0 fL   MCH 24.6 (L) 26.0 - 34.0 pg   MCHC 30.0 30.0 - 36.0 g/dL   RDW 78.2 (H) 95.6 - 21.3 %   Platelets 178 150 - 400 K/uL   Neutrophils Relative % 85 %   Lymphocytes Relative 9 %   Monocytes Relative 5 %   Eosinophils Relative 1 %   Basophils Relative 0 %   Neutro Abs 6.4 1.7 - 7.7 K/uL   Lymphs Abs 0.7 0.7 - 4.0 K/uL   Monocytes Absolute 0.4 0.1 - 1.0 K/uL   Eosinophils Absolute 0.1 0.0 - 0.7 K/uL   Basophils Absolute 0.0 0.0 - 0.1 K/uL   RBC Morphology POLYCHROMASIA PRESENT   Comprehensive metabolic panel     Status: Abnormal   Collection Time: 04/06/16  3:03 PM  Result Value Ref Range   Sodium 146 (H) 135 - 145 mmol/L   Potassium 4.7 3.5 - 5.1 mmol/L   Chloride 115 (H) 101 - 111 mmol/L   CO2 20 (L) 22 - 32 mmol/L   Glucose, Bld 114 (H) 65 - 99 mg/dL   BUN 37 (H) 6 - 20 mg/dL   Creatinine, Ser 0.86 (H) 0.61 - 1.24 mg/dL   Calcium 8.9 8.9 - 57.8 mg/dL   Total Protein 5.2 (L) 6.5 - 8.1 g/dL   Albumin 3.0 (L) 3.5 - 5.0 g/dL   AST 23 15 - 41 U/L   ALT 26 17 - 63 U/L   Alkaline Phosphatase 112 38 - 126 U/L   Total Bilirubin 1.0 0.3 - 1.2 mg/dL   GFR calc non Af Amer 19 (L) >60 mL/min   GFR calc Af Amer 23 (L) >60 mL/min   Anion gap 11 5 - 15  APTT     Status: Abnormal   Collection Time: 04/06/16  3:03 PM  Result Value  Ref Range   aPTT 40 (H) 24 - 37 seconds  Protime-INR     Status: Abnormal   Collection Time: 04/06/16  3:03 PM  Result Value Ref Range   Prothrombin Time 24.3 (H) 11.6 - 15.2 seconds   INR 2.21 (H) 0.00 - 1.49  Basic metabolic panel     Status: Abnormal   Collection Time: 04/07/16  2:51 AM  Result Value Ref Range   Sodium 148 (H) 135 - 145 mmol/L   Potassium 4.3 3.5 - 5.1 mmol/L   Chloride 114 (H) 101 - 111 mmol/L   CO2 22 22 - 32 mmol/L   Glucose, Bld 83 65 - 99 mg/dL   BUN 35 (H) 6 - 20 mg/dL   Creatinine, Ser 1.61 (H) 0.61 - 1.24 mg/dL   Calcium 9.0 8.9 - 09.6 mg/dL   GFR calc non Af Amer 21 (L) >60 mL/min   GFR calc Af Amer 25 (L) >60 mL/min   Anion gap 12 5 - 15  Protime-INR     Status: Abnormal   Collection Time: 04/07/16  2:51 AM  Result Value Ref Range   Prothrombin Time 24.9 (H) 11.6 - 15.2 seconds   INR 2.28 (H) 0.00 - 1.49    Intake/Output Summary (Last 24 hours) at 04/07/16 1202 Last data filed at 04/07/16 0900  Gross per 24 hour  Intake    720 ml  Output    651 ml  Net     69 ml    Telemetry: Sinus rhythm with occasional PVCs EKG: 2:1 atrial flutter on admit  ASSESSMENT AND PLAN:  Principal Problem:   Acute on chronic combined systolic and diastolic CHF  (congestive heart failure) (HCC) Active Problems:   HLD (hyperlipidemia)   CKD (chronic kidney disease) stage 3, GFR 30-59 ml/min   CAD (coronary artery disease)   NICM (nonischemic cardiomyopathy) (HCC)   Hypernatremia   Anemia in chronic renal disease   Chronic atrial fibrillation (HCC)   ICD (implantable cardioverter-defibrillator), biventricular, in situ   Dementia   Pressure ulcer   # Acute on chronic systolic and diastolic heart failure: Mr. Liebler is significantly volume overloaded after receiving several liters of fluid for hypernatremia.  It is unclear whether the Ins/Outs are accurately recorded.  Symptomatically he feels better and his weight is down 1kg.  We will increase lasix to 80 mg.  Continue hydralazine and Imdur.  He has not been on an ACE-I/ARB due to CKD.  We will try to add a beta blocker this admission.   S/p CRT-D.  Will follow up with EP as an outpatient.  He is not pacing much now due to sinus tachycardia.  May be able to change the AV delay to increase pacing.     # Hypernatremia: Stable.  It is unclear why he has hypernatremia in the setting of volume overload.  We will call renal for input if it worsens.   # Chronic atrial fibrillation: Mr. Nickless is currently in sinus rhythm.  He was in 2:1 atrial flutter on admission.  We will plan to start a beta blocker this admission when stable.  He is chronically anticoagulated with warfarin.  This patients CHA2DS2-VASc Score and unadjusted Ischemic Stroke Rate (% per year) is equal to 10.8 % stroke rate/year from a score of 8  Above score calculated as 1 point each if present [CHF, HTN, DM, Vascular=MI/PAD/Aortic Plaque, Age if 65-74, or Male] Above score calculated as 2 points each if present [Age > 75, or  Stroke/TIA/TE]  # Hyperlipidemia: Continue atorvastatin.   #CAD: # Prior CVA: Continue atorvastatin.  No aspirin given that he is on warfarin.   # Code: DNAR  Derak Schurman C. Duke Salvia, MD, [facility] 04/07/2016 12:02 PM

## 2016-04-08 ENCOUNTER — Inpatient Hospital Stay (HOSPITAL_COMMUNITY): Payer: Medicare Other

## 2016-04-08 DIAGNOSIS — I509 Heart failure, unspecified: Secondary | ICD-10-CM

## 2016-04-08 DIAGNOSIS — F0391 Unspecified dementia with behavioral disturbance: Secondary | ICD-10-CM

## 2016-04-08 LAB — URINE MICROSCOPIC-ADD ON: RBC / HPF: NONE SEEN RBC/hpf (ref 0–5)

## 2016-04-08 LAB — URINALYSIS, ROUTINE W REFLEX MICROSCOPIC
Bilirubin Urine: NEGATIVE
Glucose, UA: NEGATIVE mg/dL
Hgb urine dipstick: NEGATIVE
KETONES UR: NEGATIVE mg/dL
NITRITE: POSITIVE — AB
PH: 5 (ref 5.0–8.0)
Protein, ur: NEGATIVE mg/dL
Specific Gravity, Urine: 1.011 (ref 1.005–1.030)

## 2016-04-08 LAB — CBC
HEMATOCRIT: 32.7 % — AB (ref 39.0–52.0)
HEMOGLOBIN: 9.6 g/dL — AB (ref 13.0–17.0)
MCH: 23.9 pg — ABNORMAL LOW (ref 26.0–34.0)
MCHC: 29.4 g/dL — ABNORMAL LOW (ref 30.0–36.0)
MCV: 81.5 fL (ref 78.0–100.0)
Platelets: 176 10*3/uL (ref 150–400)
RBC: 4.01 MIL/uL — AB (ref 4.22–5.81)
RDW: 21.9 % — AB (ref 11.5–15.5)
WBC: 7.5 10*3/uL (ref 4.0–10.5)

## 2016-04-08 LAB — PROTIME-INR
INR: 2.59 — AB (ref 0.00–1.49)
Prothrombin Time: 27.4 seconds — ABNORMAL HIGH (ref 11.6–15.2)

## 2016-04-08 LAB — BASIC METABOLIC PANEL
ANION GAP: 13 (ref 5–15)
BUN: 35 mg/dL — ABNORMAL HIGH (ref 6–20)
CALCIUM: 8.7 mg/dL — AB (ref 8.9–10.3)
CHLORIDE: 113 mmol/L — AB (ref 101–111)
CO2: 23 mmol/L (ref 22–32)
Creatinine, Ser: 2.4 mg/dL — ABNORMAL HIGH (ref 0.61–1.24)
GFR calc non Af Amer: 25 mL/min — ABNORMAL LOW (ref >60)
GFR, EST AFRICAN AMERICAN: 29 mL/min — AB (ref >60)
Glucose, Bld: 115 mg/dL — ABNORMAL HIGH (ref 65–99)
POTASSIUM: 4.1 mmol/L (ref 3.5–5.1)
Sodium: 149 mmol/L — ABNORMAL HIGH (ref 135–145)

## 2016-04-08 LAB — ECHOCARDIOGRAM COMPLETE
Height: 74.5 in
Weight: 3345.6 oz

## 2016-04-08 NOTE — Clinical Documentation Improvement (Signed)
Cardiology Please update your documentation within the medical record to reflect your response to this query. Thank you  Can the diagnosis of pressure ulcer be further specified?   Document if pressure ulcer with stage is Present on Admission   Document Site with laterality - Elbow, Back (upper/lower), Sacral, Hip, Buttock, Ankle, Heel, Head, Other (Specify)  Pressure Ulcer Stage - Stage1, Stage 2, Stage 3, Stage 4, Unstageable, Unspecified, Unable to Clinically Determine  Document any associated diagnoses/conditions such as: with gangrene  Other  Clinically Undetermined  Supporting Information: 04/07/16 progr note.Marland KitchenMarland Kitchen"Pressure ulcer..." 04/06/16 H&P.Marland KitchenMarland Kitchen"Bilateral leg ulcer (HCC), ACHILLES AREA-- NONHEALING..."..[name]  Please exercise your independent, professional judgment when responding. A specific answer is not anticipated or expected.  Thank You,  Toribio Harbour, RN, BSN, CCDS Certified Clinical Documentation Specialist [place]: Health Information Management [phone]

## 2016-04-08 NOTE — Progress Notes (Signed)
Patient Name: Frank Mclean Date of Encounter: 04/08/2016  PATIENT ID: Frank Mclean is a 77M with hyperlipidemia, s/p BiV ICD, paroxysmal atrial fibrillation, non-obstructive CAD, hypertension, hyperlipidemia, diabetes, prior stroke, LBBB, dementia and CKD stage 3 here with acute on chronic systolic and diastolic heart failure.  SUBJECTIVE  Feeling well. No chest pain or palpitations. SOB has improved significantly, now only has minimal.   CURRENT MEDS . atorvastatin  20 mg Oral Daily  . escitalopram  5 mg Oral Daily  . feeding supplement (ENSURE ENLIVE)  237 mL Oral BID BM  . ferrous sulfate  325 mg Oral Daily  . furosemide  80 mg Intravenous BID  . hydrALAZINE  25 mg Oral TID  . isosorbide mononitrate  30 mg Oral Daily  . levothyroxine  175 mcg Oral QAC breakfast  . mometasone-formoterol  2 puff Inhalation BID  . potassium chloride  40 mEq Oral BID  . sodium chloride flush  3 mL Intravenous Q12H  . tamsulosin  0.4 mg Oral Daily  . triamcinolone cream   Topical BID  . warfarin  2.5 mg Oral Once per day on Mon Wed Fri   And  . warfarin  5 mg Oral Once per day on Sun Tue Thu Sat  . Warfarin - Pharmacist Dosing Inpatient   Does not apply q1800    OBJECTIVE  Filed Vitals:   04/07/16 2029 04/07/16 2104 04/08/16 0645 04/08/16 1004  BP:  125/95 118/84   Pulse:  109 101   Temp:  97.5 F (36.4 C) 97.9 F (36.6 C)   TempSrc:  Oral Oral   Resp:  17    Height:      Weight:   209 lb 1.6 oz (94.847 kg)   SpO2: 98% 100% 98% 93%    Intake/Output Summary (Last 24 hours) at 04/08/16 1417 Last data filed at 04/08/16 0854  Gross per 24 hour  Intake    600 ml  Output   1225 ml  Net   -625 ml   Filed Weights   04/06/16 1245 04/07/16 0500 04/08/16 0645  Weight: 212 lb 12.8 oz (96.525 kg) 211 lb 3.2 oz (95.8 kg) 209 lb 1.6 oz (94.847 kg)    PHYSICAL EXAM  General: Pleasant, NAD. Neuro: Alert and oriented X 3. Moves all extremities spontaneously. Psych: Normal affect. HEENT:   Normal  Neck: Supple without bruits or JVD. Lungs:  Resp regular and unlabored, CTA. Heart: RR with tachycardia no s3, s4, or murmurs. Abdomen: Soft, non-tender, non-distended, BS + x 4.  Extremities: No clubbing, cyanosis. 2+ BL LE  Edema R > L. DP/PT/Radials 2+ and equal bilaterally.  Accessory Clinical Findings  CBC  Recent Labs  04/06/16 1503 04/08/16 0212  WBC 7.6 7.5  NEUTROABS 6.4  --   HGB 9.6* 9.6*  HCT 32.0* 32.7*  MCV 82.1 81.5  PLT 178 176   Basic Metabolic Panel  Recent Labs  04/07/16 0251 04/08/16 0212  NA 148* 149*  K 4.3 4.1  CL 114* 113*  CO2 22 23  GLUCOSE 83 115*  BUN 35* 35*  CREATININE 2.72* 2.40*  CALCIUM 9.0 8.7*   Liver Function Tests  Recent Labs  04/06/16 1503  AST 23  ALT 26  ALKPHOS 112  BILITOT 1.0  PROT 5.2*  ALBUMIN 3.0*    TELE  Intermittent V paced   Radiology/Studies  Dg Chest 2 View  04/07/2016  CLINICAL DATA:  Shortness of Breath EXAM: CHEST  2 VIEW COMPARISON:  March 02, 2016 FINDINGS: There is no appreciable edema or consolidation. There is generalized cardiomegaly with pulmonary venous hypertension. Pacemaker lead positions are unchanged. No adenopathy. No pneumothorax. No bone lesions. IMPRESSION: Stable pulmonary vascular congestion. No frank edema or consolidation. Stable and apparently intact pacemaker leads. Electronically Signed   By: Bretta Bang III M.D.   On: 04/07/2016 07:45    ASSESSMENT AND PLAN Principal Problem:   Acute on chronic combined systolic and diastolic CHF (congestive heart failure) (HCC) Active Problems:   HLD (hyperlipidemia)   CKD (chronic kidney disease) stage 3, GFR 30-59 ml/min   CAD (coronary artery disease)   NICM (nonischemic cardiomyopathy) (HCC)   Hypernatremia   Anemia in chronic renal disease   Chronic atrial fibrillation (HCC)   ICD (implantable cardioverter-defibrillator), biventricular, in situ   Dementia     1 # Acute on chronic systolic and diastolic heart  failure: [name] is significantly volume overloaded after receiving several liters of fluid for hypernatremia. It is unclear whether the Ins/Outs are accurately recorded.Net I&O negative 1.2L.  Symptomatically he feels better and his weight is down 3 lb. Continue IV lasix to 80 mg BID. Continue hydralazine and Imdur. NO ACE-I/ARB due to CKD.Will add metoprolol 12.5mg  BID. BP stable. R > L edema. Will review with MD.  - S/p CRT-D.Tele showed intermittent pacing. Device interrogation showed normal function, has been in AFL since 12/2015.  AT/AF burden is 81%  2 # Hypernatremia: minimal increased further.  It is unclear why he has hypernatremia in the setting of volume overload. Will get renal consult.   3# Chronic atrial fibrillation: He was in 2:1 atrial flutter on admission. As above. He might benefit from DCCV.  He is chronically anticoagulated with warfarin. This patients CHA2DS2-VASc Score and unadjusted Ischemic Stroke Rate (% per year) is equal to 10.8 % stroke rate/year from a score of 8  Above score calculated as 1 point each if present [CHF, HTN, DM, Vascular=MI/PAD/Aortic Plaque, Age if 65-74, or Male] Above score calculated as 2 points each if present [Age > 75, or Stroke/TIA/TE]  4# Hyperlipidemia: Continue atorvastatin.   5# Prior CVA: Continue atorvastatin. No aspirin given that he is on warfarin.   Lorelei Pont PA-C Pager [phone]

## 2016-04-08 NOTE — Progress Notes (Signed)
  Echocardiogram 2D Echocardiogram has been performed.  Frank Mclean 04/08/2016, 12:02 PM

## 2016-04-08 NOTE — Progress Notes (Signed)
ANTICOAGULATION CONSULT NOTE - Initial Consult  Pharmacy Consult for coumadin Indication: atrial fibrillation  Allergies  Allergen Reactions  . Levothyroxine Sodium Other (See Comments)    MUST TAKE BRAND NAME GENERIC DOESN'T WORK FOR PATIENT    Patient Measurements: Height: 6' 2.5" (189.2 cm) Weight: 209 lb 1.6 oz (94.847 kg) (bed scale) IBW/kg (Calculated) : 83.35   Vital Signs: Temp: 97.9 F (36.6 C) (04/20 0645) Temp Source: Oral (04/20 0645) BP: 118/84 mmHg (04/20 0645) Pulse Rate: 101 (04/20 0645)  Labs:  Recent Labs  04/06/16 1503 04/07/16 0251 04/08/16 0212  HGB 9.6*  --  9.6*  HCT 32.0*  --  32.7*  PLT 178  --  176  APTT 40*  --   --   LABPROT 24.3* 24.9* 27.4*  INR 2.21* 2.28* 2.59*  CREATININE 2.92* 2.72* 2.40*    Estimated Creatinine Clearance: 30.9 mL/min (by C-G formula based on Cr of 2.4).   Medical History: Past Medical History  Diagnosis Date  . Gout   . HTN (hypertension)   . Hyperlipidemia   . Osteoarthritis   . GERD (gastroesophageal reflux disease)   . DJD (degenerative joint disease)   . BPH (benign prostatic hypertrophy)   . Peripheral vascular disease (HCC)   . Bilateral leg ulcer (HCC)     ACHILLES AREA--  NONHEALING  . CKD (chronic kidney disease) stage 3, GFR 30-59 ml/min   . CAD (coronary artery disease)     a. LHC 4/12: Mid LAD 25%, mid diagonal 30%, AV circumflex 40%, proximal OM 25%, distal RCA 40%  . Chronic combined systolic and diastolic heart failure (HCC)     a. Echo 05/2013: EF 25-30%, diffuse HK, restrictive physiology, trivial AI, mild MR, moderate LAE, reduced RV systolic function, PASP 42  . LBBB (left bundle branch block)   . PAF (paroxysmal atrial fibrillation) (HCC)     a. amiodarone Rx started 05/2013;  b. chronic coumadin  . NICM (nonischemic cardiomyopathy) (HCC)     a. EF 25-30%.  [name] Hx of cardiovascular stress test     Nuclear Stress Test (1/16): High risk stress nuclear study with a large, severe,  partially reversible inferior and apical defect consistent with prior inferior and apical infarct; mild apical ischemia; severe LVE; study high risk due to reduced LV function.  EF 25% >> reviewed with Dr. Antoine Poche >> medical mgmt  . Hypothyroidism   . Presence of permanent cardiac pacemaker   . AICD (automatic cardioverter/defibrillator) present   . Chronic combined systolic and diastolic CHF (congestive heart failure) (HCC)   . Shortness of breath dyspnea   . Pneumonia   . Chronic depression   . Hypernatremia   . Memory loss   . Anticoagulated on Coumadin   . Anemia in chronic renal disease   . Elevated troponin   . Hyperbilirubinemia   . Physical deconditioning   . Acute cystitis without hematuria   . Hypokalemia   . Anemia of chronic disease   . Loose stools   . Hyperkalemia   . Hypokalemia   . On home oxygen therapy     "2L at night" (04/06/2016)  . CVA (cerebral vascular accident) [facility])     Left pontine infarct July 2004; changed from Plavix to Coumadin in 2004 per MD at [facility]  . DM (diabetes mellitus), type 2 with renal complications (HCC)   . Gout     Medications:  Prescriptions prior to admission  Medication Sig Dispense Refill Last Dose  . acetaminophen (TYLENOL)  325 MG tablet Take 1 tablet (325 mg total) by mouth every 6 (six) hours as needed for mild pain (or Fever >/= 101).   04/06/2016 at Unknown time  . atorvastatin (LIPITOR) 20 MG tablet Take 1 tablet (20 mg total) by mouth daily. 90 tablet 3 04/06/2016 at Unknown time  . carvedilol (COREG) 6.25 MG tablet Take 1 tablet (6.25 mg total) by mouth 2 (two) times daily with a meal. 60 tablet 6 04/06/2016 at 6A  . Cholecalciferol (VITAMIN D-3 PO) Take 1,000 Units by mouth daily.   04/06/2016 at Unknown time  . escitalopram (LEXAPRO) 5 MG tablet TAKE 1 TABLET BY MOUTH EVERY DAY 30 tablet 3 04/06/2016 at Unknown time  . ferrous sulfate 325 (65 FE) MG tablet Take 1 tablet (325 mg total) by mouth daily. 30 tablet 11 04/06/2016 at  Unknown time  . hydrALAZINE (APRESOLINE) 25 MG tablet Take 1 tablet (25 mg total) by mouth 3 (three) times daily.   04/06/2016 at 7A  . HYDROcodone-acetaminophen (NORCO/VICODIN) 5-325 MG tablet Take 1 tablet by mouth every 8 (eight) hours as needed for moderate pain. Do not exceed 4gm of Tylenol in 24 hours 90 tablet 0 04/06/2016 at Unknown time  . isosorbide mononitrate (IMDUR) 30 MG 24 hr tablet Take 1 tablet (30 mg total) by mouth 2 (two) times daily.   04/06/2016 at Unknown time  . levothyroxine (SYNTHROID, LEVOTHROID) 175 MCG tablet Take 175 mcg by mouth daily before breakfast.   04/06/2016 at Unknown time  . Menthol, Topical Analgesic, (BIOFREEZE) 4 % GEL Apply topically 2 (two) times daily. Apply to left shoulder and left ankle   04/06/2016 at Unknown time  . OXYGEN Inhale 2.5 L/min into the lungs See admin instructions. USES OXYGEN EVERY BEDTIME USES DURING DAY ONLY AS NEEDED FOR SHORTNESS OF BREATH   Past Week at Unknown time  . potassium chloride SA (K-DUR,KLOR-CON) 20 MEQ tablet Take 20 mEq by mouth daily.    04/06/2016 at Unknown time  . SYMBICORT 160-4.5 MCG/ACT inhaler INHALE 2 PUFFS INTO THE LUNGS 2 TIMES DAILY 10.2 Inhaler 5 04/06/2016 at Unknown time  . tamsulosin (FLOMAX) 0.4 MG CAPS capsule Take 1 capsule (0.4 mg total) by mouth daily. 30 capsule 1 04/06/2016 at Unknown time  . torsemide (DEMADEX) 10 MG tablet Take 10 mg by mouth daily.   04/06/2016 at Unknown time  . triamcinolone cream (KENALOG) 0.5 % Apply topically 2 (two) times daily. 60 g 2 04/06/2016 at Unknown time  . vitamin B-12 (CYANOCOBALAMIN) 1000 MCG tablet Take 1,000 mcg by mouth daily.   04/06/2016 at Unknown time  . warfarin (COUMADIN) 5 MG tablet Take 1 tablet by mouth daily or as directed by coumadin clinic. (Patient taking differently: Take 2.5-5 mg by mouth daily at 6 PM. 2.5 MG on Monday Wednesday  Friday. 5 mg all other days.) 30 tablet 0 04/05/2016 at 6p    Assessment: 75 yo M w/ chronic systolic CHF admitted with CC  of leg swelling. Pharmacy consulted to continue home warfarin for afib. On warfarin PTA: 2.5 mg on Mon/Wed/Fri, 5 mg all other days. INR today remains therapeutic at 2.59. Hgb low at 9.6, plts wnl.   Goal of Therapy:  INR 2-3   Plan:  -warfarin 5 mg po x1 -daily INR

## 2016-04-09 DIAGNOSIS — I5043 Acute on chronic combined systolic (congestive) and diastolic (congestive) heart failure: Secondary | ICD-10-CM | POA: Diagnosis present

## 2016-04-09 LAB — BASIC METABOLIC PANEL
ANION GAP: 12 (ref 5–15)
BUN: 35 mg/dL — ABNORMAL HIGH (ref 6–20)
CALCIUM: 8.6 mg/dL — AB (ref 8.9–10.3)
CO2: 22 mmol/L (ref 22–32)
Chloride: 113 mmol/L — ABNORMAL HIGH (ref 101–111)
Creatinine, Ser: 2.29 mg/dL — ABNORMAL HIGH (ref 0.61–1.24)
GFR, EST AFRICAN AMERICAN: 30 mL/min — AB (ref >60)
GFR, EST NON AFRICAN AMERICAN: 26 mL/min — AB (ref >60)
Glucose, Bld: 91 mg/dL (ref 65–99)
Potassium: 4.6 mmol/L (ref 3.5–5.1)
Sodium: 147 mmol/L — ABNORMAL HIGH (ref 135–145)

## 2016-04-09 LAB — PROTIME-INR
INR: 2.64 — AB (ref 0.00–1.49)
Prothrombin Time: 27.8 seconds — ABNORMAL HIGH (ref 11.6–15.2)

## 2016-04-09 MED ORDER — METOPROLOL TARTRATE 12.5 MG HALF TABLET
12.5000 mg | ORAL_TABLET | Freq: Two times a day (BID) | ORAL | Status: DC
  Administered 2016-04-09 – 2016-04-13 (×9): 12.5 mg via ORAL
  Filled 2016-04-09 (×9): qty 1

## 2016-04-09 MED ORDER — FUROSEMIDE 10 MG/ML IJ SOLN
120.0000 mg | Freq: Two times a day (BID) | INTRAVENOUS | Status: DC
  Administered 2016-04-09 – 2016-04-14 (×9): 120 mg via INTRAVENOUS
  Filled 2016-04-09 (×13): qty 12

## 2016-04-09 NOTE — Progress Notes (Signed)
ANTICOAGULATION CONSULT NOTE - Initial Consult  Pharmacy Consult for coumadin Indication: atrial fibrillation  Allergies  Allergen Reactions  . Levothyroxine Sodium Other (See Comments)    MUST TAKE BRAND NAME GENERIC DOESN'T WORK FOR PATIENT    Patient Measurements: Height: 6' 2.5" (189.2 cm) Weight: 209 lb (94.802 kg) (scale a) IBW/kg (Calculated) : 83.35   Vital Signs: Temp: 98.1 F (36.7 C) (04/21 0604) Temp Source: Oral (04/21 0604) BP: 128/97 mmHg (04/21 0604) Pulse Rate: 106 (04/21 0604)  Labs:  Recent Labs  04/06/16 1503 04/07/16 0251 04/08/16 0212 04/09/16 0230  HGB 9.6*  --  9.6*  --   HCT 32.0*  --  32.7*  --   PLT 178  --  176  --   APTT 40*  --   --   --   LABPROT 24.3* 24.9* 27.4* 27.8*  INR 2.21* 2.28* 2.59* 2.64*  CREATININE 2.92* 2.72* 2.40* 2.29*    Estimated Creatinine Clearance: 32.4 mL/min (by C-G formula based on Cr of 2.29).   Medical History: Past Medical History  Diagnosis Date  . Gout   . HTN (hypertension)   . Hyperlipidemia   . Osteoarthritis   . GERD (gastroesophageal reflux disease)   . DJD (degenerative joint disease)   . BPH (benign prostatic hypertrophy)   . Peripheral vascular disease (HCC)   . Bilateral leg ulcer (HCC)     ACHILLES AREA--  NONHEALING  . CKD (chronic kidney disease) stage 3, GFR 30-59 ml/min   . CAD (coronary artery disease)     a. LHC 4/12: Mid LAD 25%, mid diagonal 30%, AV circumflex 40%, proximal OM 25%, distal RCA 40%  . Chronic combined systolic and diastolic heart failure (HCC)     a. Echo 05/2013: EF 25-30%, diffuse HK, restrictive physiology, trivial AI, mild MR, moderate LAE, reduced RV systolic function, PASP 42  . LBBB (left bundle branch block)   . PAF (paroxysmal atrial fibrillation) (HCC)     a. amiodarone Rx started 05/2013;  b. chronic coumadin  . NICM (nonischemic cardiomyopathy) (HCC)     a. EF 25-30%.  [name] Hx of cardiovascular stress test     Nuclear Stress Test (1/16): High risk  stress nuclear study with a large, severe, partially reversible inferior and apical defect consistent with prior inferior and apical infarct; mild apical ischemia; severe LVE; study high risk due to reduced LV function.  EF 25% >> reviewed with Dr. Antoine Poche >> medical mgmt  . Hypothyroidism   . Presence of permanent cardiac pacemaker   . AICD (automatic cardioverter/defibrillator) present   . Chronic combined systolic and diastolic CHF (congestive heart failure) (HCC)   . Shortness of breath dyspnea   . Pneumonia   . Chronic depression   . Hypernatremia   . Memory loss   . Anticoagulated on Coumadin   . Anemia in chronic renal disease   . Elevated troponin   . Hyperbilirubinemia   . Physical deconditioning   . Acute cystitis without hematuria   . Hypokalemia   . Anemia of chronic disease   . Loose stools   . Hyperkalemia   . Hypokalemia   . On home oxygen therapy     "2L at night" (04/06/2016)  . CVA (cerebral vascular accident) [facility])     Left pontine infarct July 2004; changed from Plavix to Coumadin in 2004 per MD at [facility]  . DM (diabetes mellitus), type 2 with renal complications (HCC)   . Gout     Medications:  Prescriptions prior to admission  Medication Sig Dispense Refill Last Dose  . acetaminophen (TYLENOL) 325 MG tablet Take 1 tablet (325 mg total) by mouth every 6 (six) hours as needed for mild pain (or Fever >/= 101).   04/06/2016 at Unknown time  . atorvastatin (LIPITOR) 20 MG tablet Take 1 tablet (20 mg total) by mouth daily. 90 tablet 3 04/06/2016 at Unknown time  . carvedilol (COREG) 6.25 MG tablet Take 1 tablet (6.25 mg total) by mouth 2 (two) times daily with a meal. 60 tablet 6 04/06/2016 at 6A  . Cholecalciferol (VITAMIN D-3 PO) Take 1,000 Units by mouth daily.   04/06/2016 at Unknown time  . escitalopram (LEXAPRO) 5 MG tablet TAKE 1 TABLET BY MOUTH EVERY DAY 30 tablet 3 04/06/2016 at Unknown time  . ferrous sulfate 325 (65 FE) MG tablet Take 1 tablet (325 mg  total) by mouth daily. 30 tablet 11 04/06/2016 at Unknown time  . hydrALAZINE (APRESOLINE) 25 MG tablet Take 1 tablet (25 mg total) by mouth 3 (three) times daily.   04/06/2016 at 7A  . HYDROcodone-acetaminophen (NORCO/VICODIN) 5-325 MG tablet Take 1 tablet by mouth every 8 (eight) hours as needed for moderate pain. Do not exceed 4gm of Tylenol in 24 hours 90 tablet 0 04/06/2016 at Unknown time  . isosorbide mononitrate (IMDUR) 30 MG 24 hr tablet Take 1 tablet (30 mg total) by mouth 2 (two) times daily.   04/06/2016 at Unknown time  . levothyroxine (SYNTHROID, LEVOTHROID) 175 MCG tablet Take 175 mcg by mouth daily before breakfast.   04/06/2016 at Unknown time  . Menthol, Topical Analgesic, (BIOFREEZE) 4 % GEL Apply topically 2 (two) times daily. Apply to left shoulder and left ankle   04/06/2016 at Unknown time  . OXYGEN Inhale 2.5 L/min into the lungs See admin instructions. USES OXYGEN EVERY BEDTIME USES DURING DAY ONLY AS NEEDED FOR SHORTNESS OF BREATH   Past Week at Unknown time  . potassium chloride SA (K-DUR,KLOR-CON) 20 MEQ tablet Take 20 mEq by mouth daily.    04/06/2016 at Unknown time  . SYMBICORT 160-4.5 MCG/ACT inhaler INHALE 2 PUFFS INTO THE LUNGS 2 TIMES DAILY 10.2 Inhaler 5 04/06/2016 at Unknown time  . tamsulosin (FLOMAX) 0.4 MG CAPS capsule Take 1 capsule (0.4 mg total) by mouth daily. 30 capsule 1 04/06/2016 at Unknown time  . torsemide (DEMADEX) 10 MG tablet Take 10 mg by mouth daily.   04/06/2016 at Unknown time  . triamcinolone cream (KENALOG) 0.5 % Apply topically 2 (two) times daily. 60 g 2 04/06/2016 at Unknown time  . vitamin B-12 (CYANOCOBALAMIN) 1000 MCG tablet Take 1,000 mcg by mouth daily.   04/06/2016 at Unknown time  . warfarin (COUMADIN) 5 MG tablet Take 1 tablet by mouth daily or as directed by coumadin clinic. (Patient taking differently: Take 2.5-5 mg by mouth daily at 6 PM. 2.5 MG on Monday Wednesday  Friday. 5 mg all other days.) 30 tablet 0 04/05/2016 at 6p     Assessment: 75 yo M w/ chronic systolic CHF admitted with CC of leg swelling. On Coumadin  daily exc 2.5mg  on MWF PTA for Afib. INR today remains therapeutic at 2.64. Unsure if patient missed dose yesterday, not charted in [facility]. Hgb low at 9.6, plts wnl. No s/s of bleed.  Goal of Therapy:  INR 2-3   Plan:  Continue PTA coumadin dose of  daily exc 2.5mg  on MWF Monitor daily INR, CBC, s/s of bleed

## 2016-04-09 NOTE — Progress Notes (Signed)
Patient Name: Frank Mclean Date of Encounter: 04/09/2016  Principal Problem:   Acute on chronic combined systolic and diastolic CHF (congestive heart failure) (HCC) Active Problems:   HLD (hyperlipidemia)   CKD (chronic kidney disease) stage 3, GFR 30-59 ml/min   CAD (coronary artery disease)   NICM (nonischemic cardiomyopathy) (HCC)   Hypernatremia   Anemia in chronic renal disease   Chronic atrial fibrillation (HCC)   ICD (implantable cardioverter-defibrillator), biventricular, in situ   Dementia   Patient Profile: [name] is a 58M with hyperlipidemia, s/p BiV ICD, paroxysmal atrial fibrillation, non-obstructive CAD, hypertension, hyperlipidemia, diabetes, prior stroke, LBBB, dementia and CKD stage 3 here with acute on chronic systolic and diastolic heart failure.   SUBJECTIVE: Feels well, denies SOB. Says his appetite is coming back and he felt like sitting up in the chair today.    OBJECTIVE Filed Vitals:   04/08/16 2015 04/08/16 2132 04/09/16 0604 04/09/16 0909  BP:  119/84 128/97   Pulse: 110 102 106   Temp:  98 F (36.7 C) 98.1 F (36.7 C)   TempSrc:  Oral Oral   Resp: Height:      Weight:   209 lb (94.802 kg)   SpO2: 100% 99% 99% 90%    Intake/Output Summary (Last 24 hours) at 04/09/16 1045 Last data filed at 04/09/16 0834  Gross per 24 hour  Intake    720 ml  Output    950 ml  Net   -230 ml   Filed Weights   04/07/16 0500 04/08/16 0645 04/09/16 0604  Weight: 211 lb 3.2 oz (95.8 kg) 209 lb 1.6 oz (94.847 kg) 209 lb (94.802 kg)    PHYSICAL EXAM General: Well developed, well nourished,elderly male in no acute distress. Head: Normocephalic, atraumatic.  Neck: Supple without bruits, 6-7 cm JVD. Lungs:  Resp regular and unlabored, Crackles in BLL. Some wheezes in RUL .  Heart: RRR, S1, S2, no S3, S4, or murmur; no rub. Abdomen: Soft, non-tender, non-distended, BS + x 4.  Extremities: No clubbing, cyanosis, Generalized edema, +3 BLE.    Neuro: Alert and oriented X 3. Moves all extremities spontaneously. Psych: Normal affect.  LABS: CBC: Recent Labs  04/06/16 1503 04/08/16 0212  WBC 7.6 7.5  NEUTROABS 6.4  --   HGB 9.6* 9.6*  HCT 32.0* 32.7*  MCV 82.1 81.5  PLT 178 176   INR: Recent Labs  04/09/16 0230  INR 2.64*   Basic Metabolic Panel: Recent Labs  04/08/16 0212 04/09/16 0230  NA 149* 147*  K 4.1 4.6  CL 113* 113*  CO2 23 22  GLUCOSE 115* 91  BUN 35* 35*  CREATININE 2.40* 2.29*  CALCIUM 8.7* 8.6*   Liver Function Tests: Recent Labs  04/06/16 1503  AST 23  ALT 26  ALKPHOS 112  BILITOT 1.0  PROT 5.2*  ALBUMIN 3.0*   BNP:  B NATRIURETIC PEPTIDE  Date/Time Value Ref Range Status  03/03/2016 05:13 AM 1253.1* 0.0 - 100.0 pg/mL Final  06/09/2015 02:46 PM 384.2* 0.0 - 100.0 pg/mL Final   PRO B NATRIURETIC PEPTIDE (BNP)  Date/Time Value Ref Range Status  06/20/2015 01:01 PM 539.0* 0.0 - 100.0 pg/mL Final  06/16/2015 12:16 PM 405.0* 0.0 - 100.0 pg/mL Final     Current facility-administered medications:  .  0.9 %  sodium chloride infusion, 250 mL, Intravenous, PRN, Beatrice Lecher, PA-C .  acetaminophen (TYLENOL) tablet 650 mg, 650 mg, Oral, Q4H PRN, Lorin Picket  Moishe Spice, PA-C .  atorvastatin (LIPITOR) tablet 20 mg, 20 mg, Oral, Daily, Scott T Weaver, PA-C, 20 mg at 04/08/16 2956 .  escitalopram (LEXAPRO) tablet 5 mg, 5 mg, Oral, Daily, Scott T Weaver, PA-C, 5 mg at 04/08/16 2130 .  feeding supplement (ENSURE ENLIVE) (ENSURE ENLIVE) liquid 237 mL, 237 mL, Oral, BID BM, Vesta Mixer, MD, 237 mL at 04/08/16 1500 .  ferrous sulfate tablet 325 mg, 325 mg, Oral, Daily, Scott T Weaver, PA-C, 325 mg at 04/08/16 8657 .  furosemide (LASIX) injection 80 mg, 80 mg, Intravenous, BID, Chilton Si, MD, 80 mg at 04/09/16 0831 .  hydrALAZINE (APRESOLINE) tablet 25 mg, 25 mg, Oral, TID, Scott T Weaver, PA-C, 25 mg at 04/08/16 2220 .  HYDROcodone-acetaminophen (NORCO/VICODIN) 5-325 MG per tablet 1 tablet,  1 tablet, Oral, Q8H PRN, Scott T Weaver, PA-C .  isosorbide mononitrate (IMDUR) 24 hr tablet 30 mg, 30 mg, Oral, Daily, Scott T Weaver, PA-C, 30 mg at 04/08/16 8469 .  levothyroxine (SYNTHROID, LEVOTHROID) tablet 175 mcg, 175 mcg, Oral, QAC breakfast, Beatrice Lecher, PA-C, 175 mcg at 04/09/16 6295 .  mometasone-formoterol (DULERA) 200-5 MCG/ACT inhaler 2 puff, 2 puff, Inhalation, BID, Beatrice Lecher, PA-C, 2 puff at 04/09/16 0909 .  ondansetron (ZOFRAN) injection 4 mg, 4 mg, Intravenous, Q6H PRN, Beatrice Lecher, PA-C, 4 mg at 04/08/16 0133 .  potassium chloride SA (K-DUR,KLOR-CON) CR tablet 40 mEq, 40 mEq, Oral, BID, Beatrice Lecher, PA-C, 40 mEq at 04/08/16 2220 .  sodium chloride flush (NS) 0.9 % injection 3 mL, 3 mL, Intravenous, Q12H, Scott T Weaver, PA-C, 3 mL at 04/08/16 2220 .  sodium chloride flush (NS) 0.9 % injection 3 mL, 3 mL, Intravenous, PRN, Beatrice Lecher, PA-C .  tamsulosin (FLOMAX) capsule 0.4 mg, 0.4 mg, Oral, Daily, Scott T Weaver, PA-C, 0.4 mg at 04/08/16 0939 .  triamcinolone cream (KENALOG) 0.5 %, , Topical, BID, Scott T Weaver, PA-C .  warfarin (COUMADIN) tablet 2.5 mg, 2.5 mg, Oral, Once per day on Mon Wed Fri, 2.5 mg at 04/07/16 1808 **AND** warfarin (COUMADIN) tablet 5 mg, 5 mg, Oral, Once per day on Sun Tue Thu Sat, Michelle T Bell, RPH, 5 mg at 04/06/16 1800 .  Warfarin - Pharmacist Dosing Inpatient, , Does not apply, q1800, Herby Abraham, RPH, 0  at 04/07/16 1800      TELE: Aflutter rate in 100's, Vpaced at times.      Echo 04/08/16:   - Left ventricle: The cavity size was moderately dilated. There was  mild focal basal and mild concentric hypertrophy of the septum.  Systolic function was severely reduced. The estimated ejection  fraction was in the range of 20% to 25%. Diffuse hypokinesis.  Doppler parameters are consistent with restrictive physiology,  indicative of decreased left ventricular diastolic compliance  and/or increased left atrial  pressure. Doppler parameters are  consistent with elevated ventricular end-diastolic filling  pressure. - Ventricular septum: Septal motion showed paradox. - Mitral valve: There was mild regurgitation directed centrally. - Left atrium: The atrium was moderately dilated. - Right ventricle: The cavity size was moderately dilated. Wall  thickness was normal. Systolic function was moderately reduced. - Right atrium: The atrium was severely dilated. - Tricuspid valve: There was moderate-severe regurgitation. - Pulmonary arteries: PA peak pressure: 51 mm Hg (S).  Impressions:  - When compared to the prior study from 02/26/2015 LVEF has further  deteriorated, now 15-20%, previously 30-35%.  RVEF remains severely depressed.  Radiology/Studies: No results found.   Current Medications:  . atorvastatin  20 mg Oral Daily  . escitalopram  5 mg Oral Daily  . feeding supplement (ENSURE ENLIVE)  237 mL Oral BID BM  . ferrous sulfate  325 mg Oral Daily  . furosemide  80 mg Intravenous BID  . hydrALAZINE  25 mg Oral TID  . isosorbide mononitrate  30 mg Oral Daily  . levothyroxine  175 mcg Oral QAC breakfast  . mometasone-formoterol  2 puff Inhalation BID  . potassium chloride  40 mEq Oral BID  . sodium chloride flush  3 mL Intravenous Q12H  . tamsulosin  0.4 mg Oral Daily  . triamcinolone cream   Topical BID  . warfarin  2.5 mg Oral Once per day on Mon Wed Fri   And  . warfarin  5 mg Oral Once per day on Sun Tue Thu Sat  . Warfarin - Pharmacist Dosing Inpatient   Does not apply q1800      ASSESSMENT AND PLAN: Principal Problem:   Acute on chronic combined systolic and diastolic CHF (congestive heart failure) (HCC) Active Problems:   HLD (hyperlipidemia)   CKD (chronic kidney disease) stage 3, GFR 30-59 ml/min   CAD (coronary artery disease)   NICM (nonischemic cardiomyopathy) (HCC)   Hypernatremia   Anemia in chronic renal disease   Chronic atrial fibrillation (HCC)   ICD  (implantable cardioverter-defibrillator), biventricular, in situ   Dementia  1. Acute on chronic systolic and diastolic heart failure: Echo shows EF reduced to 20-25%. Mr. Guard is still volume overloaded after receiving several liters of fluid for hypernatremia.Weight is the same as yesterday, he is negative -365 overnight. Creatinine is better than yesterday, down to 2.29. Sodium is 147 today. No ACE/ARB due to CKD. We will add beta blocker today.   Continue IV lasix to 80 mg BID. Continue hydralazine and Imdur.  S/p CRT-D.Tele showed intermittent pacing. Device interrogation showed normal function, has been in AFL since 12/2015. AT/AF burden is 81%. Plan is for DCCV on Monday.   2. Hypernatremia: Renal consulted. Unclear etiology in setting of volume overload.   3. Chronic Atrial Fibrillation:He was in 2:1 atrial flutter on admission. As above. He might benefit from DCCV. He is chronically anticoagulated with warfarin. This patients CHA2DS2-VASc Score and unadjusted Ischemic Stroke Rate (% per year) is equal to 10.8 % stroke rate/year from a score of 8  Above score calculated as 1 point each if present [CHF, HTN, DM, Vascular=MI/PAD/Aortic Plaque, Age if 65-74, or Male] Above score calculated as 2 points each if present [Age > 75, or Stroke/TIA/TE]   Plan is for DCCV on Monday. INR has been therapeutic since 03/05/16.    4. Hyperlipidemia: Continue atorvastatin.   5. Prior CVA: Continue atorvastatin. No aspirin given that he is on warfarin.     Signed, Little Ishikawa , NP 10:45 AM 04/09/2016 Pager [phone]

## 2016-04-09 NOTE — Care Management Important Message (Signed)
Important Message  Patient Details  Name: Frank Mclean MRN: [id number] Date of Birth: October 08, 1941   Medicare Important Message Given:  Yes    Presly Steinruck P Dail Lerew 04/09/2016, 1:06 PM

## 2016-04-10 DIAGNOSIS — N189 Chronic kidney disease, unspecified: Secondary | ICD-10-CM

## 2016-04-10 DIAGNOSIS — F0390 Unspecified dementia without behavioral disturbance: Secondary | ICD-10-CM

## 2016-04-10 DIAGNOSIS — D631 Anemia in chronic kidney disease: Secondary | ICD-10-CM

## 2016-04-10 LAB — PROTIME-INR
INR: 2.86 — AB (ref 0.00–1.49)
PROTHROMBIN TIME: 29.6 s — AB (ref 11.6–15.2)

## 2016-04-10 NOTE — Progress Notes (Signed)
Patient Name: Frank Mclean Date of Encounter: 04/10/2016  Principal Problem:   Acute on chronic combined systolic and diastolic CHF (congestive heart failure) (HCC) Active Problems:   HLD (hyperlipidemia)   CKD (chronic kidney disease) stage 3, GFR 30-59 ml/min   CAD (coronary artery disease)   NICM (nonischemic cardiomyopathy) (HCC)   Hypernatremia   Anemia in chronic renal disease   Chronic atrial fibrillation (HCC)   ICD (implantable cardioverter-defibrillator), biventricular, in situ   Dementia   Acute on chronic systolic and diastolic heart failure, NYHA class 1 (HCC)   Acute on chronic systolic and diastolic heart failure, NYHA class 4 (HCC)   Patient Profile: Frank Mclean is a 22M with hyperlipidemia, s/p BiV ICD, paroxysmal atrial fibrillation, non-obstructive CAD, hypertension, hyperlipidemia, diabetes, prior stroke, LBBB, dementia and CKD stage 3 here with acute on chronic systolic and diastolic heart failure.   SUBJECTIVE: Feels better,not back to baseline yet, he has persistent LE edema,hasn't start to walk yet.   OBJECTIVE Filed Vitals:   04/09/16 2032 04/10/16 0321 04/10/16 0817 04/10/16 1000  BP: 108/66 104/66  110/79  Pulse: 95 105  88  Temp: 98 F (36.7 C) 97.5 F (36.4 C)    TempSrc: Oral Oral    Resp: 18 17    Height:      Weight:  212 lb 9.6 oz (96.435 kg)    SpO2: 94% 96% 98%     Intake/Output Summary (Last 24 hours) at 04/10/16 1148 Last data filed at 04/10/16 0820  Gross per 24 hour  Intake    422 ml  Output   1125 ml  Net   -703 ml   Filed Weights   04/08/16 0645 04/09/16 0604 04/10/16 0321  Weight: 209 lb 1.6 oz (94.847 kg) 209 lb (94.802 kg) 212 lb 9.6 oz (96.435 kg)    PHYSICAL EXAM General: Well developed, well nourished,elderly male in no acute distress. Head: Normocephalic, atraumatic.  Neck: Supple without bruits, 6-7 cm JVD. Lungs:  Resp regular and unlabored, Crackles in BLL. Decreased BS in the right base Heart: RRR, S1, S2,  no S3, S4, or murmur; no rub. Abdomen: Soft, non-tender, non-distended, BS + x 4.  Extremities: No clubbing, cyanosis, Generalized edema, +2 BLE.  Neuro: Alert and oriented X 3. Moves all extremities spontaneously. Psych: Normal affect.  LABS: CBC:  Recent Labs  04/08/16 0212  WBC 7.5  HGB 9.6*  HCT 32.7*  MCV 81.5  PLT 176   INR:  Recent Labs  04/10/16 0303  INR 2.86*   Basic Metabolic Panel:  Recent Labs  16/10/96 0212 04/09/16 0230  NA 149* 147*  K 4.1 4.6  CL 113* 113*  CO2 23 22  GLUCOSE 115* 91  BUN 35* 35*  CREATININE 2.40* 2.29*  CALCIUM 8.7* 8.6*   Liver Function Tests:No results for input(s): AST, ALT, ALKPHOS, BILITOT, PROT, ALBUMIN in the last 72 hours. BNP:  B NATRIURETIC PEPTIDE  Date/Time Value Ref Range Status  03/03/2016 05:13 AM 1253.1* 0.0 - 100.0 pg/mL Final  06/09/2015 02:46 PM 384.2* 0.0 - 100.0 pg/mL Final   . atorvastatin  20 mg Oral Daily  . escitalopram  5 mg Oral Daily  . feeding supplement (ENSURE ENLIVE)  237 mL Oral BID BM  . ferrous sulfate  325 mg Oral Daily  . furosemide  120 mg Intravenous BID  . hydrALAZINE  25 mg Oral TID  . isosorbide mononitrate  30 mg Oral Daily  . levothyroxine  175 mcg  Oral QAC breakfast  . metoprolol tartrate  12.5 mg Oral BID  . mometasone-formoterol  2 puff Inhalation BID  . potassium chloride  40 mEq Oral BID  . sodium chloride flush  3 mL Intravenous Q12H  . tamsulosin  0.4 mg Oral Daily  . triamcinolone cream   Topical BID  . warfarin  2.5 mg Oral Once per day on Mon Wed Fri   And  . warfarin  5 mg Oral Once per day on Sun Tue Thu Sat  . Warfarin - Pharmacist Dosing Inpatient   Does not apply q1800     TELE: Aflutter rate in 100's, Vpaced at times.    Echo 04/08/16:   - Left ventricle: The cavity size was moderately dilated. There was  mild focal basal and mild concentric hypertrophy of the septum.  Systolic function was severely reduced. The estimated ejection  fraction was  in the range of 20% to 25%. Diffuse hypokinesis.  Doppler parameters are consistent with restrictive physiology,  indicative of decreased left ventricular diastolic compliance  and/or increased left atrial pressure. Doppler parameters are  consistent with elevated ventricular end-diastolic filling  pressure. - Ventricular septum: Septal motion showed paradox. - Mitral valve: There was mild regurgitation directed centrally. - Left atrium: The atrium was moderately dilated. - Right ventricle: The cavity size was moderately dilated. Wall  thickness was normal. Systolic function was moderately reduced. - Right atrium: The atrium was severely dilated. - Tricuspid valve: There was moderate-severe regurgitation. - Pulmonary arteries: PA peak pressure: 51 mm Hg (S).  Impressions:  - When compared to the prior study from 02/26/2015 LVEF has further  deteriorated, now 15-20%, previously 30-35%.  RVEF remains severely depressed.   Radiology/Studies: No results found.   Current Medications:  . atorvastatin  20 mg Oral Daily  . escitalopram  5 mg Oral Daily  . feeding supplement (ENSURE ENLIVE)  237 mL Oral BID BM  . ferrous sulfate  325 mg Oral Daily  . furosemide  120 mg Intravenous BID  . hydrALAZINE  25 mg Oral TID  . isosorbide mononitrate  30 mg Oral Daily  . levothyroxine  175 mcg Oral QAC breakfast  . metoprolol tartrate  12.5 mg Oral BID  . mometasone-formoterol  2 puff Inhalation BID  . potassium chloride  40 mEq Oral BID  . sodium chloride flush  3 mL Intravenous Q12H  . tamsulosin  0.4 mg Oral Daily  . triamcinolone cream   Topical BID  . warfarin  2.5 mg Oral Once per day on Mon Wed Fri   And  . warfarin  5 mg Oral Once per day on Sun Tue Thu Sat  . Warfarin - Pharmacist Dosing Inpatient   Does not apply q1800      ASSESSMENT AND PLAN: Principal Problem:   Acute on chronic combined systolic and diastolic CHF (congestive heart failure) (HCC) Active  Problems:   HLD (hyperlipidemia)   CKD (chronic kidney disease) stage 3, GFR 30-59 ml/min   CAD (coronary artery disease)   NICM (nonischemic cardiomyopathy) (HCC)   Hypernatremia   Anemia in chronic renal disease   Chronic atrial fibrillation (HCC)   ICD (implantable cardioverter-defibrillator), biventricular, in situ   Dementia   Acute on chronic systolic and diastolic heart failure, NYHA class 1 (HCC)   Acute on chronic systolic and diastolic heart failure, NYHA class 4 (HCC)  1. Acute on chronic systolic and diastolic heart failure: Echo shows EF reduced to 20-25%. Frank Mclean is still volume overloaded  after receiving several liters of fluid for hypernatremia.Weight increasing, but negative fluid balance - 700 cc in the last 24 hours.  Still fluid overloaded.  Crea not checked today. We will start to mobilize.  No ACE/ARB due to CKD. Added beta blocker yesterday.   Continue IV lasix to 80 mg BID. Continue hydralazine and Imdur.  S/p CRT-D.Tele showed intermittent pacing. Device interrogation showed normal function, has been in AFL since 12/2015. AT/AF burden is 81%. Plan is for DCCV on Monday.   2. Hypernatremia: Renal consulted. Unclear etiology in setting of volume overload. Now resolved.  3. Chronic Atrial Fibrillation:He was in 2:1 atrial flutter on admission. As above. He might benefit from DCCV. He is chronically anticoagulated with warfarin. This patients CHA2DS2-VASc Score and unadjusted Ischemic Stroke Rate (% per year) is equal to 10.8 % stroke rate/year from a score of 8  Above score calculated as 1 point each if present [CHF, HTN, DM, Vascular=MI/PAD/Aortic Plaque, Age if 65-74, or Male] Above score calculated as 2 points each if present [Age > 75, or Stroke/TIA/TE]   Plan is for DCCV on Monday. INR has been therapeutic since 03/05/16.    4. Hyperlipidemia: Continue atorvastatin.   5. Prior CVA: Continue atorvastatin. No aspirin given that he is on  warfarin.     Rico Sheehan , NP 11:48 AM 04/10/2016 Pager [phone]

## 2016-04-10 NOTE — Progress Notes (Signed)
ANTICOAGULATION CONSULT NOTE - Consult  Pharmacy Consult for coumadin Indication: atrial fibrillation  Allergies  Allergen Reactions  . Levothyroxine Sodium Other (See Comments)    MUST TAKE BRAND NAME GENERIC DOESN'T WORK FOR PATIENT    Patient Measurements: Height: 6' 2.5" (189.2 cm) Weight: 212 lb 9.6 oz (96.435 kg) (a scale) IBW/kg (Calculated) : 83.35   Vital Signs: Temp: 97.5 F (36.4 C) (04/22 0321) Temp Source: Oral (04/22 0321) BP: 104/66 mmHg (04/22 0321) Pulse Rate: 105 (04/22 0321)  Labs:  Recent Labs  04/08/16 0212 04/09/16 0230 04/10/16 0303  HGB 9.6*  --   --   HCT 32.7*  --   --   PLT 176  --   --   LABPROT 27.4* 27.8* 29.6*  INR 2.59* 2.64* 2.86*  CREATININE 2.40* 2.29*  --     Estimated Creatinine Clearance: 32.4 mL/min (by C-G formula based on Cr of 2.29).   Assessment: 75 yo M w/ chronic systolic CHF admitted with CC of leg swelling. On Coumadin 5mg  daily exc 2.5mg  on MWF PTA for Afib. Pt's INR was therapeutic on admission. INR today remains therapeutic at 2.86. Hgb low at 9.6 (4/20), plts wnl. No s/s of bleed.  Goal of Therapy:  INR 2-3   Plan:  Continue PTA coumadin dose of 5mg  daily exc 2.5mg  on MWF Monitor daily INR, CBC, s/s of bleed  Remi Haggard, PharmD Clinical Pharmacist- Resident Pager: [phone]

## 2016-04-11 DIAGNOSIS — I4892 Unspecified atrial flutter: Secondary | ICD-10-CM

## 2016-04-11 LAB — BASIC METABOLIC PANEL
Anion gap: 10 (ref 5–15)
BUN: 36 mg/dL — AB (ref 6–20)
CO2: 27 mmol/L (ref 22–32)
Calcium: 8.9 mg/dL (ref 8.9–10.3)
Chloride: 110 mmol/L (ref 101–111)
Creatinine, Ser: 2.26 mg/dL — ABNORMAL HIGH (ref 0.61–1.24)
GFR calc Af Amer: 31 mL/min — ABNORMAL LOW (ref >60)
GFR, EST NON AFRICAN AMERICAN: 26 mL/min — AB (ref >60)
GLUCOSE: 119 mg/dL — AB (ref 65–99)
POTASSIUM: 4.6 mmol/L (ref 3.5–5.1)
Sodium: 147 mmol/L — ABNORMAL HIGH (ref 135–145)

## 2016-04-11 LAB — PROTIME-INR
INR: 2.68 — AB (ref 0.00–1.49)
Prothrombin Time: 28.1 seconds — ABNORMAL HIGH (ref 11.6–15.2)

## 2016-04-11 MED ORDER — SODIUM CHLORIDE 0.9% FLUSH: 3.0000 mL | INTRAVENOUS | Status: DC | PRN

## 2016-04-11 MED ORDER — SODIUM CHLORIDE 0.9% FLUSH
3.0000 mL | Freq: Two times a day (BID) | INTRAVENOUS | Status: DC
  Administered 2016-04-11 – 2016-04-15 (×5): 3 mL via INTRAVENOUS

## 2016-04-11 MED ORDER — SODIUM CHLORIDE 0.9 % IV SOLN
250.0000 mL | INTRAVENOUS | Status: DC
  Administered 2016-04-12: 250 mL via INTRAVENOUS

## 2016-04-11 NOTE — Evaluation (Signed)
Physical Therapy Evaluation Patient Details Name: Frank Mclean MRN: [id number] DOB: [birth year] Today's Date: 04/11/2016   History of Present Illness  [name] is a 63M with hyperlipidemia, s/p BiV ICD, paroxysmal atrial fibrillation, non-obstructive CAD, hypertension, hyperlipidemia, diabetes, prior stroke, LBBB, dementia and CKD stage 3 here with acute on chronic systolic and diastolic heart failure.  Clinical Impression   Pt admitted with above diagnosis. Pt currently with functional limitations due to the deficits listed below (see PT Problem List).  Pt will benefit from skilled PT to increase their independence and safety with mobility to allow discharge to the venue listed below.       Follow Up Recommendations Home health PT;Supervision/Assistance - 24 hour    Equipment Recommendations  3in1 (PT)    Recommendations for Other Services OT consult     Precautions / Restrictions Precautions Precautions: Fall      Mobility  Bed Mobility                  Transfers Overall transfer level: Needs assistance Equipment used: Rolling walker (2 wheeled) Transfers: Sit to/from Stand Sit to Stand: Mod assist         General transfer comment: light mod assist to power up  Ambulation/Gait Ambulation/Gait assistance: Min guard Ambulation Distance (Feet): 60 Feet Assistive device: Rolling walker (2 wheeled) Gait Pattern/deviations: Step-through pattern;Decreased stride length;Trunk flexed     General Gait Details: Noted tendency for trunk to flex with incr distance walked and fatigue; cues for Rw proximity; O2 sats remained greater than or equal to 92% with amb on room air  Stairs            Wheelchair Mobility    Modified Rankin (Stroke Patients Only)       Balance                                             Pertinent Vitals/Pain Pain Assessment: No/denies pain    Home Living Family/patient expects to be discharged to:: Private  residence Living Arrangements: Spouse/significant other;Other relatives Available Help at Discharge: Family;Available 24 hours/day Type of Home: House Home Access: Stairs to enter Entrance Stairs-Rails: Left Entrance Stairs-Number of Steps: 5-6 Home Layout: One level Home Equipment: Walker - 2 wheels;Cane - quad      Prior Function Level of Independence: Needs assistance   Gait / Transfers Assistance Needed: Ambulates with quad cane     Comments: per chart review, spouse assists with meds     Hand Dominance   Dominant Hand: Right    Extremity/Trunk Assessment   Upper Extremity Assessment: Generalized weakness           Lower Extremity Assessment: Generalized weakness         Communication   Communication: Expressive difficulties (Slightly slurred speech.  Difficult to understand at times)  Cognition Arousal/Alertness: Awake/alert Behavior During Therapy: WFL for tasks assessed/performed Overall Cognitive Status: No family/caregiver present to determine baseline cognitive functioning                      General Comments      Exercises        Assessment/Plan    PT Assessment Patient needs continued PT services  PT Diagnosis Difficulty walking;Generalized weakness   PT Problem List Decreased strength;Decreased range of motion;Decreased activity tolerance;Decreased balance;Decreased mobility;Decreased coordination;Decreased cognition;Decreased knowledge of use of  DME;Decreased knowledge of precautions;Cardiopulmonary status limiting activity  PT Treatment Interventions DME instruction;Gait training;Stair training;Functional mobility training;Therapeutic activities;Therapeutic exercise;Patient/family education   PT Goals (Current goals can be found in the Care Plan section) Acute Rehab PT Goals Patient Stated Goal: agreeable to amb PT Goal Formulation: With patient Time For Goal Achievement: 04/25/16 Potential to Achieve Goals: Good    Frequency  Min 3X/week   Barriers to discharge        Co-evaluation               End of Session Equipment Utilized During Treatment: Gait belt Activity Tolerance: Patient tolerated treatment well Patient left: in chair;with call bell/phone within reach;with chair alarm set;with family/visitor present Nurse Communication: Mobility status         Time: 1154 (start time is approx)-1216 PT Time Calculation (min) (ACUTE ONLY): 22 min   Charges:   PT Evaluation $PT Eval Moderate Complexity: 1 Procedure     PT G CodesOlen Pel 04/11/2016, 1:46 PM  Van Clines, [place]  [facility] Pager [phone] Office [phone]

## 2016-04-11 NOTE — Progress Notes (Signed)
ANTICOAGULATION CONSULT NOTE - Consult  Pharmacy Consult for coumadin Indication: atrial fibrillation  Allergies  Allergen Reactions  . Levothyroxine Sodium Other (See Comments)    MUST TAKE BRAND NAME GENERIC DOESN'T WORK FOR PATIENT    Patient Measurements: Height: 6' 2.5" (189.2 cm) Weight: 212 lb 7.7 oz (96.381 kg) (Scale A) IBW/kg (Calculated) : 83.35   Vital Signs: Temp: 98.4 F (36.9 C) (04/23 0921) Temp Source: Oral (04/23 0921) BP: 137/96 mmHg (04/23 0921) Pulse Rate: 109 (04/23 0921)  Labs:  Recent Labs  04/09/16 0230 04/10/16 0303 04/11/16 0224  LABPROT 27.8* 29.6* 28.1*  INR 2.64* 2.86* 2.68*  CREATININE 2.29*  --  2.26*    Estimated Creatinine Clearance: 32.8 mL/min (by C-G formula based on Cr of 2.26).   Assessment: 75 yo M w/ chronic systolic CHF admitted with CC of leg swelling. On Coumadin 5mg  daily exc 2.5mg  on MWF PTA for Afib. Pt's INR was therapeutic on admission. INR today remains therapeutic at 2.68. Hgb low at 9.6 (4/20), plts wnl. No s/s of bleed.  Goal of Therapy:  INR 2-3   Plan:  Continue PTA coumadin dose of 5mg  daily exc 2.5mg  on MWF Monitor daily INR, CBC, s/s of bleed F/u plan is for DCCV Monday  Remi Haggard, PharmD Clinical Pharmacist- Resident Pager: [phone]

## 2016-04-11 NOTE — Progress Notes (Signed)
Patient Name: ANTHONY TAMBURO Date of Encounter: 04/11/2016  Principal Problem:   Acute on chronic combined systolic and diastolic CHF (congestive heart failure) (HCC) Active Problems:   HLD (hyperlipidemia)   CKD (chronic kidney disease) stage 3, GFR 30-59 ml/min   CAD (coronary artery disease)   NICM (nonischemic cardiomyopathy) (HCC)   Hypernatremia   Anemia in chronic renal disease   Chronic atrial fibrillation (HCC)   ICD (implantable cardioverter-defibrillator), biventricular, in situ   Dementia   Acute on chronic systolic and diastolic heart failure, NYHA class 1 (HCC)   Acute on chronic systolic and diastolic heart failure, NYHA class 4 (HCC)   Patient Profile: Mr. Probert is a 4M with hyperlipidemia, s/p BiV ICD, paroxysmal atrial fibrillation, non-obstructive CAD, hypertension, hyperlipidemia, diabetes, prior stroke, LBBB, dementia and CKD stage 3 here with acute on chronic systolic and diastolic heart failure.   SUBJECTIVE: Feels better, he tried to walk and it felt ok, has persistent mild LE edema.  OBJECTIVE Filed Vitals:   04/10/16 1638 04/10/16 2120 04/11/16 0455 04/11/16 0726  BP: 113/89  114/72   Pulse:   82   Temp:   98.2 F (36.8 C)   TempSrc:   Oral   Resp:   16   Height:      Weight:   212 lb 7.7 oz (96.381 kg)   SpO2:  100% 100% 99%    Intake/Output Summary (Last 24 hours) at 04/11/16 0914 Last data filed at 04/11/16 0850  Gross per 24 hour  Intake   1618 ml  Output   1700 ml  Net    -82 ml   Filed Weights   04/09/16 0604 04/10/16 0321 04/11/16 0455  Weight: 209 lb (94.802 kg) 212 lb 9.6 oz (96.435 kg) 212 lb 7.7 oz (96.381 kg)   PHYSICAL EXAM General: Well developed, well nourished,elderly male in no acute distress. Head: Normocephalic, atraumatic.  Neck: Supple without bruits, 6-7 cm JVD. Lungs:  Resp regular and unlabored, Crackles in BLL. Decreased BS in the right base Heart: RRR, S1, S2, no S3, S4, or murmur; no rub. Abdomen: Soft,  non-tender, non-distended, BS + x 4.  Extremities: No clubbing, cyanosis, Generalized edema, +2 BLE.  Neuro: Alert and oriented X 3. Moves all extremities spontaneously. Psych: Normal affect.   Recent Labs  04/11/16 0224  INR 2.68*   Basic Metabolic Panel:  Recent Labs  16/10/96 0230 04/11/16 0224  NA 147* 147*  K 4.6 4.6  CL 113* 110  CO2 22 27  GLUCOSE 91 119*  BUN 35* 36*  CREATININE 2.29* 2.26*  CALCIUM 8.6* 8.9   BNP:  B NATRIURETIC PEPTIDE  Date/Time Value Ref Range Status  03/03/2016 05:13 AM 1253.1* 0.0 - 100.0 pg/mL Final  06/09/2015 02:46 PM 384.2* 0.0 - 100.0 pg/mL Final   . atorvastatin  20 mg Oral Daily  . escitalopram  5 mg Oral Daily  . feeding supplement (ENSURE ENLIVE)  237 mL Oral BID BM  . ferrous sulfate  325 mg Oral Daily  . furosemide  120 mg Intravenous BID  . hydrALAZINE  25 mg Oral TID  . isosorbide mononitrate  30 mg Oral Daily  . levothyroxine  175 mcg Oral QAC breakfast  . metoprolol tartrate  12.5 mg Oral BID  . mometasone-formoterol  2 puff Inhalation BID  . potassium chloride  40 mEq Oral BID  . sodium chloride flush  3 mL Intravenous Q12H  . tamsulosin  0.4 mg Oral Daily  . triamcinolone  cream   Topical BID  . warfarin  2.5 mg Oral Once per day on Mon Wed Fri   And  . warfarin  5 mg Oral Once per day on Sun Tue Thu Sat  . Warfarin - Pharmacist Dosing Inpatient   Does not apply q1800   TELE: Aflutter rate in 100's, V-paced at times.    Echo 04/08/16:   - Left ventricle: The cavity size was moderately dilated. There was  mild focal basal and mild concentric hypertrophy of the septum.  Systolic function was severely reduced. The estimated ejection  fraction was in the range of 20% to 25%. Diffuse hypokinesis.  Doppler parameters are consistent with restrictive physiology,  indicative of decreased left ventricular diastolic compliance  and/or increased left atrial pressure. Doppler parameters are  consistent with  elevated ventricular end-diastolic filling  pressure. - Ventricular septum: Septal motion showed paradox. - Mitral valve: There was mild regurgitation directed centrally. - Left atrium: The atrium was moderately dilated. - Right ventricle: The cavity size was moderately dilated. Wall  thickness was normal. Systolic function was moderately reduced. - Right atrium: The atrium was severely dilated. - Tricuspid valve: There was moderate-severe regurgitation. - Pulmonary arteries: PA peak pressure: 51 mm Hg (S).  Impressions: - When compared to the prior study from 02/26/2015 LVEF has further  deteriorated, now 15-20%, previously 30-35%.  RVEF remains severely depressed.  Current Medications:  . atorvastatin  20 mg Oral Daily  . escitalopram  5 mg Oral Daily  . feeding supplement (ENSURE ENLIVE)  237 mL Oral BID BM  . ferrous sulfate  325 mg Oral Daily  . furosemide  120 mg Intravenous BID  . hydrALAZINE  25 mg Oral TID  . isosorbide mononitrate  30 mg Oral Daily  . levothyroxine  175 mcg Oral QAC breakfast  . metoprolol tartrate  12.5 mg Oral BID  . mometasone-formoterol  2 puff Inhalation BID  . potassium chloride  40 mEq Oral BID  . sodium chloride flush  3 mL Intravenous Q12H  . tamsulosin  0.4 mg Oral Daily  . triamcinolone cream   Topical BID  . warfarin  2.5 mg Oral Once per day on Mon Wed Fri   And  . warfarin  5 mg Oral Once per day on Sun Tue Thu Sat  . Warfarin - Pharmacist Dosing Inpatient   Does not apply q1800      ASSESSMENT AND PLAN: Principal Problem:   Acute on chronic combined systolic and diastolic CHF (congestive heart failure) (HCC) Active Problems:   HLD (hyperlipidemia)   CKD (chronic kidney disease) stage 3, GFR 30-59 ml/min   CAD (coronary artery disease)   NICM (nonischemic cardiomyopathy) (HCC)   Hypernatremia   Anemia in chronic renal disease   Chronic atrial fibrillation (HCC)   ICD (implantable cardioverter-defibrillator), biventricular,  in situ   Dementia   Acute on chronic systolic and diastolic heart failure, NYHA class 1 (HCC)   Acute on chronic systolic and diastolic heart failure, NYHA class 4 (HCC)  1. Acute on chronic systolic and diastolic heart failure: Echo shows EF reduced to 20-25%. Mr. Aldridge is still volume overloaded after receiving several liters of fluid for hypernatremia.Weight increasing, but negative fluid balance - 700 cc in the last 24 hours.  Still mildly fluid overloaded. He feels back to baseline, I will switch his iv lasix to oral today - lasix 80 mg po BID and see how he responds, anticipated discharge tomorrow after DCCV. Crea stable. Continue to  mobilize.  No ACE/ARB due to CKD. Added beta blocker yesterday.  Continue hydralazine and Imdur.  S/p CRT-D.Tele showed intermittent pacing. Device interrogation showed normal function, has been in AFL since 12/2015. AT/AF burden is 81%. Plan is for DCCV on Monday.   2. Hypernatremia: Renal consulted. Unclear etiology in setting of volume overload. Now improved.  3. Chronic Atrial Fibrillation:He was in 2:1 atrial flutter on admission. As above. He might benefit from DCCV. He is chronically anticoagulated with warfarin. This patients CHA2DS2-VASc Score and unadjusted Ischemic Stroke Rate (% per year) is equal to 10.8 % stroke rate/year from a score of 8  Above score calculated as 1 point each if present [CHF, HTN, DM, Vascular=MI/PAD/Aortic Plaque, Age if 65-74, or Male] Above score calculated as 2 points each if present [Age > 75, or Stroke/TIA/TE]   Plan is for DCCV on Monday. INR has been therapeutic since 03/05/16.   4. Hyperlipidemia: Continue atorvastatin.   5. Prior CVA: Continue atorvastatin. No aspirin given that he is on warfarin.   Signed, Lars Masson , MD  9:14 AM 04/11/2016

## 2016-04-12 ENCOUNTER — Encounter (HOSPITAL_COMMUNITY)
Admission: AD | Disposition: A | Discharge status: Home Health | Payer: Self-pay | Source: Ambulatory Visit | Attending: Cardiovascular Disease

## 2016-04-12 ENCOUNTER — Encounter (HOSPITAL_COMMUNITY): Payer: Self-pay

## 2016-04-12 ENCOUNTER — Inpatient Hospital Stay (HOSPITAL_COMMUNITY): Payer: Medicare Other | Admitting: Certified Registered Nurse Anesthetist

## 2016-04-12 DIAGNOSIS — I110 Hypertensive heart disease with heart failure: Secondary | ICD-10-CM

## 2016-04-12 DIAGNOSIS — Z7901 Long term (current) use of anticoagulants: Secondary | ICD-10-CM

## 2016-04-12 DIAGNOSIS — Z5181 Encounter for therapeutic drug level monitoring: Secondary | ICD-10-CM

## 2016-04-12 DIAGNOSIS — I43 Cardiomyopathy in diseases classified elsewhere: Secondary | ICD-10-CM

## 2016-04-12 DIAGNOSIS — I119 Hypertensive heart disease without heart failure: Secondary | ICD-10-CM

## 2016-04-12 DIAGNOSIS — I4891 Unspecified atrial fibrillation: Secondary | ICD-10-CM

## 2016-04-12 HISTORY — PX: CARDIOVERSION: SHX1299

## 2016-04-12 LAB — BASIC METABOLIC PANEL
Anion gap: 13 (ref 5–15)
BUN: 34 mg/dL — AB (ref 6–20)
CHLORIDE: 109 mmol/L (ref 101–111)
CO2: 25 mmol/L (ref 22–32)
Calcium: 8.9 mg/dL (ref 8.9–10.3)
Creatinine, Ser: 2.09 mg/dL — ABNORMAL HIGH (ref 0.61–1.24)
GFR calc Af Amer: 34 mL/min — ABNORMAL LOW (ref >60)
GFR calc non Af Amer: 29 mL/min — ABNORMAL LOW (ref >60)
Glucose, Bld: 105 mg/dL — ABNORMAL HIGH (ref 65–99)
POTASSIUM: 4.2 mmol/L (ref 3.5–5.1)
SODIUM: 147 mmol/L — AB (ref 135–145)

## 2016-04-12 LAB — PROTIME-INR
INR: 2.81 — ABNORMAL HIGH (ref 0.00–1.49)
Prothrombin Time: 29.1 seconds — ABNORMAL HIGH (ref 11.6–15.2)

## 2016-04-12 SURGERY — CARDIOVERSION
Anesthesia: Monitor Anesthesia Care

## 2016-04-12 MED ORDER — PROPOFOL 10 MG/ML IV BOLUS
INTRAVENOUS | Status: DC | PRN
  Administered 2016-04-12: 55 mg via INTRAVENOUS

## 2016-04-12 MED ORDER — LIDOCAINE HCL (CARDIAC) 20 MG/ML IV SOLN
INTRAVENOUS | Status: DC | PRN
  Administered 2016-04-12: 100 mg via INTRAVENOUS

## 2016-04-12 MED ORDER — SODIUM CHLORIDE 0.9 % IV SOLN
INTRAVENOUS | Status: DC
  Administered 2016-04-12: 500 mL via INTRAVENOUS

## 2016-04-12 NOTE — Progress Notes (Signed)
ANTICOAGULATION CONSULT NOTE - FOLLOW UP  Pharmacy Consult:  Coumadin Indication: atrial fibrillation  Allergies  Allergen Reactions  . Levothyroxine Sodium Other (See Comments)    MUST TAKE BRAND NAME GENERIC DOESN'T WORK FOR PATIENT    Patient Measurements: Height: 6' 2.5" (189.2 cm) Weight: 216 lb 11.3 oz (98.296 kg) (Scale A) IBW/kg (Calculated) : 83.35  Vital Signs: Temp: 98.1 F (36.7 C) (04/24 0300) Temp Source: Oral (04/24 0300) BP: 141/92 mmHg (04/24 0300) Pulse Rate: 108 (04/24 0300)  Labs:  Recent Labs  04/10/16 0303 04/11/16 0224 04/12/16 0224  LABPROT 29.6* 28.1* 29.1*  INR 2.86* 2.68* 2.81*  CREATININE  --  2.26* 2.09*    Estimated Creatinine Clearance: 35.5 mL/min (by C-G formula based on Cr of 2.09).   Assessment: 76 YOM with chronic systolic CHF admitted with complaint of leg swelling.  Patient continues on Coumadin from PTA for history of Afib.  INR remains therapeutic; no bleeding reported.  Noted plan for DCCV today.   Goal of Therapy:  INR 2-3    Plan:  - Continue Coumadin 5mg  daily at 1800 except 2.5mg  on MWF - Daily PT / INR for now   Videl Nobrega D. Laney Potash, PharmD, BCPS Pager:  [phone] 04/12/2016, 6:23 AM

## 2016-04-12 NOTE — Transfer of Care (Signed)
Immediate Anesthesia Transfer of Care Note  Patient: Frank Mclean  Procedure(s) Performed: Procedure(s): CARDIOVERSION (N/A)  Patient Location: Endoscopy Unit  Anesthesia Type:MAC  Level of Consciousness: sedated, patient cooperative and responds to stimulation  Airway & Oxygen Therapy: Patient Spontanous Breathing  Post-op Assessment: Report given to RN and Post -op Vital signs reviewed and stable  Post vital signs: Reviewed and stable  Last Vitals:  Filed Vitals:   04/12/16 0815 04/12/16 0816  BP:    Pulse: 110 110  Temp:    Resp: 24 18    Complications: No apparent anesthesia complications

## 2016-04-12 NOTE — Interval H&P Note (Signed)
History and Physical Interval Note:  04/12/2016 7:51 AM  Frank Mclean  has presented today for surgery, with the diagnosis of afib  The various methods of treatment have been discussed with the patient and family. After consideration of risks, benefits and other options for treatment, the patient has consented to  Procedure(s): CARDIOVERSION (N/A) as a surgical intervention .  [name] history has been reviewed, patient examined, no change in status, stable for surgery.  I have reviewed the patient's chart and labs.  Questions were answered to the patient's satisfaction.     Charlton Haws

## 2016-04-12 NOTE — Progress Notes (Addendum)
Patient ambulated in the hallway with walker for 600 ft. HR maintained in the 80's/paced rhythm. No complications. Will continue to monitor.

## 2016-04-12 NOTE — Anesthesia Postprocedure Evaluation (Signed)
Anesthesia Post Note  Patient: Frank Mclean  Procedure(s) Performed: Procedure(s) (LRB): CARDIOVERSION (N/A)  Patient location during evaluation: Endoscopy Anesthesia Type: MAC Level of consciousness: awake and alert Pain management: pain level controlled Vital Signs Assessment: post-procedure vital signs reviewed and stable Respiratory status: spontaneous breathing, nonlabored ventilation, respiratory function stable and patient connected to nasal cannula oxygen Cardiovascular status: stable and blood pressure returned to baseline Anesthetic complications: no    Last Vitals:  Filed Vitals:   04/12/16 0950 04/12/16 1007  BP: 120/84 115/90  Pulse: 73 82  Temp:    Resp: 27     Last Pain:  Filed Vitals:   04/12/16 1007  PainSc: Asleep                 Phillips Grout

## 2016-04-12 NOTE — Anesthesia Preprocedure Evaluation (Addendum)
Anesthesia Evaluation    Airway Mallampati: II  TM Distance: >3 FB Neck ROM: Full    Dental no notable dental hx. (+) Edentulous Upper, Edentulous Lower   Pulmonary shortness of breath, former smoker,    Pulmonary exam normal        Cardiovascular Exercise Tolerance: Poor hypertension, + CAD and +CHF  Normal cardiovascular exam+ dysrhythmias Atrial Fibrillation + Cardiac Defibrillator + Valvular Problems/Murmurs MR  Rhythm:Regular Rate:Normal  LBBB NICM EF 30-35%   Neuro/Psych Left pontine infarct July 2004.  Dementia CVA    GI/Hepatic   Endo/Other  diabetes, Type 2  Renal/GU Renal InsufficiencyRenal disease     Musculoskeletal   Abdominal   Peds  Hematology  (+) anemia ,   Anesthesia Other Findings   Reproductive/Obstetrics                           Anesthesia Physical Anesthesia Plan  ASA: IV  Anesthesia Plan: MAC   Post-op Pain Management:    Induction: Intravenous  Airway Management Planned: Nasal Cannula and Natural Airway  Additional Equipment:   Intra-op Plan:   Post-operative Plan:   Informed Consent: I have reviewed the patients History and Physical, chart, labs and discussed the procedure including the risks, benefits and alternatives for the proposed anesthesia with the patient or authorized representative who has indicated his/her understanding and acceptance.   Dental advisory given  Plan Discussed with: CRNA  Anesthesia Plan Comments:        Anesthesia Quick Evaluation

## 2016-04-12 NOTE — CV Procedure (Signed)
DCC: Anesthesia:  Propofol 50 mg  [employer] Rep present Rhythm Atrial flutter 220 bpm Unable to rapid pace out of rhythm  DCC x1 120 J  Converted to NSR  V pacing rate 64  No immediate neurologic sequelae  Charlton Haws

## 2016-04-12 NOTE — Progress Notes (Signed)
Patient Profile: Frank Mclean is a 1M with hyperlipidemia, s/p BiV ICD, paroxysmal atrial fibrillation, non-obstructive CAD, hypertension, hyperlipidemia, diabetes, prior stroke, LBBB, dementia and CKD stage 3 here with acute on chronic systolic and diastolic heart failure.   Subjective: No complaints breathing improved.  Objective: Vital signs in last 24 hours: Temp:  [97.5 F (36.4 C)-98.7 F (37.1 C)] 97.5 F (36.4 C) (04/24 0931) Pulse Rate:  [72-110] 82 (04/24 1007) Resp:  [12-27] 27 (04/24 0950) BP: (114-151)/(76-109) 115/90 mmHg (04/24 1007) SpO2:  [97 %-100 %] 100 % (04/24 1007) Weight:  [216 lb (97.977 kg)-216 lb 11.3 oz (98.296 kg)] 216 lb (97.977 kg) (04/24 0732) Weight change: 4 lb 3.6 oz (1.915 kg) Last BM Date: 04/11/16 Intake/Output from previous day: -679 04/23 0701 - 04/24 0700 In: 1622 [P.O.:1560; IV Piggyback:62] Out: 2301 [Urine:2300; Stool:1] Intake/Output this shift: Total I/O In: 580 [P.O.:480; I.V.:100] Out: 350 [Urine:350]  PE: General:Pleasant affect, NAD Skin:Warm and dry, brisk capillary refill HEENT:normocephalic, sclera clear, mucus membranes moist Neck:supple, no JVD, no bruits  Heart:S1S2 RRR without murmur, gallup, rub or click Lungs:clear to diminished without rales, rhonchi, or wheezes KGM:WNUU, non tender, + BS, do not palpate liver spleen or masses Ext:2+ lower ext edema on Rt and 1+ on Lt,  2+ radial pulses Neuro:alert and oriented, MAE, follows commands, + facial symmetry Tele:  SR with PVCs   Lab Results: No results for input(s): WBC, HGB, HCT, PLT in the last 72 hours. BMET  Recent Labs  04/11/16 0224 04/12/16 0224  NA 147* 147*  K 4.6 4.2  CL 110 109  CO2 27 25  GLUCOSE 119* 105*  BUN 36* 34*  CREATININE 2.26* 2.09*  CALCIUM 8.9 8.9   No results for input(s): TROPONINI in the last 72 hours.  Invalid input(s): CK, MB  Lab Results  Component Value Date   CHOL 97 03/19/2016   HDL 25* 03/19/2016   LDLCALC  52 03/19/2016   LDLDIRECT 177.9 06/17/2010   TRIG 101 03/19/2016   CHOLHDL 5 06/24/2014   Lab Results  Component Value Date   HGBA1C 5.7* 03/03/2016     Lab Results  Component Value Date   TSH 0.15* 03/15/2016    Hepatic Function Panel No results for input(s): PROT, ALBUMIN, AST, ALT, ALKPHOS, BILITOT, BILIDIR, IBILI in the last 72 hours. No results for input(s): CHOL in the last 72 hours. No results for input(s): PROTIME in the last 72 hours.     Studies/Results: No results found.  Medications: I have reviewed the patient's current medications. Scheduled Meds: . atorvastatin  20 mg Oral Daily  . escitalopram  5 mg Oral Daily  . feeding supplement (ENSURE ENLIVE)  237 mL Oral BID BM  . ferrous sulfate  325 mg Oral Daily  . furosemide  120 mg Intravenous BID  . hydrALAZINE  25 mg Oral TID  . isosorbide mononitrate  30 mg Oral Daily  . levothyroxine  175 mcg Oral QAC breakfast  . metoprolol tartrate  12.5 mg Oral BID  . mometasone-formoterol  2 puff Inhalation BID  . potassium chloride  40 mEq Oral BID  . sodium chloride flush  3 mL Intravenous Q12H  . sodium chloride flush  3 mL Intravenous Q12H  . tamsulosin  0.4 mg Oral Daily  . triamcinolone cream   Topical BID  . warfarin  2.5 mg Oral Once per day on Mon Wed Fri   And  . warfarin  5 mg Oral Once  per day on Sun Tue Thu Sat  . Warfarin - Pharmacist Dosing Inpatient   Does not apply q1800   Continuous Infusions: . sodium chloride 250 mL (04/12/16 0900)   PRN Meds:.sodium chloride, acetaminophen, HYDROcodone-acetaminophen, ondansetron (ZOFRAN) IV, sodium chloride flush, sodium chloride flush  Assessment/Plan: Principal Problem:   Acute on chronic systolic and diastolic heart failure, NYHA class 4 (HCC) Active Problems:   CKD (chronic kidney disease) stage 3, GFR 30-59 ml/min   NICM (nonischemic cardiomyopathy) (HCC)   Chronic atrial fibrillation (HCC)   Hypertensive cardiomyopathy (HCC)   DM (diabetes  mellitus), type 2 with renal complications (HCC)   Non-occlusive coronary artery disease   Anticoagulated on Coumadin   Dementia   HLD (hyperlipidemia)   Hypernatremia   Anemia in chronic renal disease   ICD (implantable cardioverter-defibrillator), biventricular, in situ   1. Acute on chronic systolic and diastolic heart failure: Echo shows EF reduced to 20-25%. From 30-35% in 02/2015. Frank Mclean is still volume overloaded after receiving several liters of fluid for hypernatremia.Weight increasing, but negative fluid balance - 679 cc in the last 24 hours.  Still mildly fluid overloaded. He feels back to baseline,  Crea stable. Continue to mobilize.  No ACE/ARB due to CKD. Added beta blocker yesterday.  Continue hydralazine and Imdur. Negative 4155 since admit wt continues to increase from 209 to 216?  --> Will give 1 more dose of IV Lasix today and switch to oral seeping    PAF---underwent DCCV today with 120 J.  V pacing at 64  (S/p CRT-D.Tele showed intermittent pacing. Device interrogation showed normal function, has been in AFL since 12/2015. AT/AF burden is 81%. )  2. Hypernatremia: Renal consulted. Unclear etiology in setting of volume overload. Now improved. --147  3. paroxysmal Atrial Fibrillation:He was in 2:1 atrial flutter on admission. As above.  He is chronically anticoagulated with warfarin. This patients CHA2DS2-VASc Score and unadjusted Ischemic Stroke Rate (% per year) is equal to 10.8 % stroke rate/year from a score of 8  Above score calculated as 1 point each if present [CHF, HTN, DM, Vascular=MI/PAD/Aortic Plaque, Age if 65-74, or Male] Above score calculated as 2 points each if present [Age > 75, or Stroke/TIA/TE]   INR has been therapeutic since 03/05/16.   post DCCV in SR   4. Hyperlipidemia: Continue atorvastatin.   5. Prior CVA: Continue atorvastatin. No aspirin given that he is on warfarin.   6. CKD-3 Cr 2.32 to 1.96 is baseline  7. Hx NICM  with lower EF in A fib.  8. Dementia progressive decline DNR.   9. BiV pacer    LOS: 6 days   Time spent with pt. :15 minutes. [facility] R  Nurse Practitioner Certified Pager [phone] or after 5pm and on weekends call [phone] 04/12/2016, 11:10 AM  I have seen, examined and evaluated the patient this AM along with Ms. Annie Paras, NP.  After reviewing all the available data and chart,  I agree with her findings, examination as well as impression recommendations.   Patient feels much better today after cardioversion to sinus rhythm. He does continue to feel somewhat fatigued and tired. He has some edema that is persistent as well. Quite feel well enough and stable enough for discharge today. I do agree clinically that he probably is better to monitor for another day. His renal function has improved, his INR remains therapeutic.  No significant pressure ulcers noted at this time. He still has edema, therefore I agree with 1 more dose  of IV Lasix today. Start by mouth tonight. I think for discharge and he may be better served to be on higher dose morning Lasix as opposed to 80 twice a day, would consider 120 mg mg the morning and 80 mg in the evening.   He will need to be up and ambulate and to monitor his heart rate responsiveness, would also like to follow his swelling. If he is stable in the morning of discharge in the morning.  Marykay Lex, M.D., M.S. Interventional Cardiologist   Pager # [phone] Phone # [phone] [address] [place], [place] [address]

## 2016-04-13 LAB — BASIC METABOLIC PANEL
ANION GAP: 13 (ref 5–15)
BUN: 34 mg/dL — AB (ref 6–20)
CHLORIDE: 107 mmol/L (ref 101–111)
CO2: 25 mmol/L (ref 22–32)
Calcium: 9.1 mg/dL (ref 8.9–10.3)
Creatinine, Ser: 2.18 mg/dL — ABNORMAL HIGH (ref 0.61–1.24)
GFR calc Af Amer: 32 mL/min — ABNORMAL LOW (ref >60)
GFR, EST NON AFRICAN AMERICAN: 28 mL/min — AB (ref >60)
GLUCOSE: 135 mg/dL — AB (ref 65–99)
POTASSIUM: 3.9 mmol/L (ref 3.5–5.1)
Sodium: 145 mmol/L (ref 135–145)

## 2016-04-13 LAB — CBC
HCT: 32.6 % — ABNORMAL LOW (ref 39.0–52.0)
HEMOGLOBIN: 9.7 g/dL — AB (ref 13.0–17.0)
MCH: 24 pg — ABNORMAL LOW (ref 26.0–34.0)
MCHC: 29.8 g/dL — ABNORMAL LOW (ref 30.0–36.0)
MCV: 80.5 fL (ref 78.0–100.0)
PLATELETS: 180 10*3/uL (ref 150–400)
RBC: 4.05 MIL/uL — ABNORMAL LOW (ref 4.22–5.81)
RDW: 21.1 % — ABNORMAL HIGH (ref 11.5–15.5)
WBC: 6.9 10*3/uL (ref 4.0–10.5)

## 2016-04-13 LAB — PROTIME-INR
INR: 2.95 — AB (ref 0.00–1.49)
Prothrombin Time: 30.2 seconds — ABNORMAL HIGH (ref 11.6–15.2)

## 2016-04-13 MED ORDER — WARFARIN SODIUM 2 MG PO TABS
4.0000 mg | ORAL_TABLET | Freq: Once | ORAL | Status: AC
  Administered 2016-04-13: 4 mg via ORAL
  Filled 2016-04-13: qty 2

## 2016-04-13 MED ORDER — CARVEDILOL 6.25 MG PO TABS
6.2500 mg | ORAL_TABLET | Freq: Two times a day (BID) | ORAL | Status: DC
  Administered 2016-04-13: 6.25 mg via ORAL
  Filled 2016-04-13: qty 1

## 2016-04-13 NOTE — Progress Notes (Signed)
Physical Therapy Treatment Patient Details Name: Frank Mclean MRN: [id number] DOB: Mar 30, 1941 Today's Date: 04/13/2016    History of Present Illness Frank Mclean is a 23M with hyperlipidemia, s/p BiV ICD, paroxysmal atrial fibrillation, non-obstructive CAD, hypertension, hyperlipidemia, diabetes, prior stroke, LBBB, dementia and CKD stage 3 here with acute on chronic systolic and diastolic heart failure.    PT Comments    While sitting pt w/ spontaneous onset of SOB and dyspnea noted x3 episodes, each lasting ~15 seconds.  HR remained stable during this time in the 80's, SpO2 noted to drop as low as 86% on RA but quickly returing to 90's after each episode.  Pt denies chest pain or any other symptoms during these episodes.  RN made aware.  PA in room at end of session and made aware as well.  Pt currently requires min assist to boost up to standing using RW.  Pt will benefit from continued skilled PT services to increase functional independence and safety.   Follow Up Recommendations  Home health PT;Supervision/Assistance - 24 hour     Equipment Recommendations  3in1 (PT)    Recommendations for Other Services OT consult     Precautions / Restrictions Precautions Precautions: Fall Restrictions Weight Bearing Restrictions: No    Mobility  Bed Mobility               General bed mobility comments: Pt sitting in recliner chair upon PT arrival  Transfers Overall transfer level: Needs assistance Equipment used: Rolling walker (2 wheeled) Transfers: Sit to/from Stand Sit to Stand: Min assist         General transfer comment: Min assist to power to standing x4 from recliner chair.  Upon standing pt w/ +BM and pericare provided.  Ambulation/Gait             General Gait Details: Deferred today due to pt's SOB.  See general notes below for more detail.   Stairs            Wheelchair Mobility    Modified Rankin (Stroke Patients Only)       Balance Overall  balance assessment: Needs assistance Sitting-balance support: Bilateral upper extremity supported;Feet supported Sitting balance-Leahy Scale: Fair     Standing balance support: Bilateral upper extremity supported;During functional activity Standing balance-Leahy Scale: Poor Standing balance comment: Requires Bil UE support for static standing                    Cognition Arousal/Alertness: Awake/alert Behavior During Therapy: WFL for tasks assessed/performed Overall Cognitive Status: No family/caregiver present to determine baseline cognitive functioning                      Exercises General Exercises - Lower Extremity Ankle Circles/Pumps: AROM;Both;10 reps;Seated Long Arc Quad: AROM;Both;10 reps;Seated    General Comments General comments (skin integrity, edema, etc.): While sitting pt w/ spontaneous onset of SOB and dyspnea noted x3 episodes, each lasting ~15 seconds.  HR remained stable during this time in the 80's, SpO2 noted to drop as low as 86% on RA but quickly returing to 90's after episode.  Pt denies chest pain or any other symptoms during these episodes.        Pertinent Vitals/Pain Pain Assessment: No/denies pain    Home Living                      Prior Function  PT Goals (current goals can now be found in the care plan section) Acute Rehab PT Goals PT Goal Formulation: With patient Time For Goal Achievement: 04/25/16 Potential to Achieve Goals: Good Progress towards PT goals: Progressing toward goals    Frequency  Min 3X/week    PT Plan Current plan remains appropriate    Co-evaluation             End of Session Equipment Utilized During Treatment: Gait belt Activity Tolerance: Patient tolerated treatment well Patient left: in chair;with call bell/phone within reach;with chair alarm set;Other (comment) (PA at bedside; informed about pt's SOB episodes)     Time: 0786-7544 PT Time Calculation (min) (ACUTE  ONLY): 32 min  Charges:  $Therapeutic Exercise: 8-22 mins $Therapeutic Activity: 8-22 mins                    G Codes:      Encarnacion Chu PT, DPT  Pager: [phone] Phone: [phone] 04/13/2016, 12:22 PM

## 2016-04-13 NOTE — Care Management Important Message (Signed)
Important Message  Patient Details  Name: Frank Mclean MRN: [id number] Date of Birth: Jul 11, 1941   Medicare Important Message Given:  Yes    [name] [name] 04/13/2016, 1:29 PM

## 2016-04-13 NOTE — Progress Notes (Addendum)
Patient Profile: Frank Mclean is a 23M with hyperlipidemia, s/p BiV ICD, paroxysmal atrial fibrillation, non-obstructive CAD, hypertension, hyperlipidemia, diabetes, prior stroke, LBBB, dementia and CKD stage 3 here with acute on chronic systolic and diastolic heart failure.   Subjective: Patient still with bouts of dyspnea while rest. This last only a few seconds. Physical therapist is currently by bedside and reports that O2 stats drop into the mid 80s during episodes, but quickly bounces back up into the low 90s.   Objective: Vital signs in last 24 hours: Temp:  [97.9 F (36.6 C)-98.1 F (36.7 C)] 97.9 F (36.6 C) (04/25 0635) Pulse Rate:  [93] 93 (04/25 0635) Resp:  [17-18] 18 (04/25 0635) BP: (115-123)/(87-95) 115/87 mmHg (04/25 0635) SpO2:  [98 %-100 %] 98 % (04/25 1035) Weight:  [209 lb 4.8 oz (94.938 kg)] 209 lb 4.8 oz (94.938 kg) (04/25 0635) Last BM Date: 04/11/16  Intake/Output from previous day: 04/24 0701 - 04/25 0700 In: 944 [P.O.:720; I.V.:100; IV Piggyback:124] Out: 1700 [Urine:1700] Intake/Output this shift: Total I/O In: 182 [P.O.:120; IV Piggyback:62] Out: -   Medications Current Facility-Administered Medications  Medication Dose Route Frequency Provider Last Rate Last Dose  . 0.9 %  sodium chloride infusion  250 mL Intravenous PRN Scott T Weaver, PA-C      . 0.9 %  sodium chloride infusion  250 mL Intravenous Continuous Lars Masson, MD 1 mL/hr at 04/12/16 0900 250 mL at 04/12/16 0900  . acetaminophen (TYLENOL) tablet 650 mg  650 mg Oral Q4H PRN Beatrice Lecher, PA-C   650 mg at 04/10/16 0758  . atorvastatin (LIPITOR) tablet 20 mg  20 mg Oral Daily Beatrice Lecher, PA-C   20 mg at 04/13/16 1013  . escitalopram (LEXAPRO) tablet 5 mg  5 mg Oral Daily Beatrice Lecher, PA-C   5 mg at 04/13/16 1013  . feeding supplement (ENSURE ENLIVE) (ENSURE ENLIVE) liquid 237 mL  237 mL Oral BID BM Vesta Mixer, MD   237 mL at 04/13/16 1015  . ferrous sulfate tablet 325  mg  325 mg Oral Daily Beatrice Lecher, PA-C   325 mg at 04/13/16 1013  . furosemide (LASIX) 120 mg in dextrose 5 % 50 mL IVPB  120 mg Intravenous BID Chilton Si, MD   120 mg at 04/13/16 0856  . hydrALAZINE (APRESOLINE) tablet 25 mg  25 mg Oral TID Beatrice Lecher, PA-C   25 mg at 04/13/16 1014  . HYDROcodone-acetaminophen (NORCO/VICODIN) 5-325 MG per tablet 1 tablet  1 tablet Oral Q8H PRN Beatrice Lecher, PA-C      . isosorbide mononitrate (IMDUR) 24 hr tablet 30 mg  30 mg Oral Daily Beatrice Lecher, PA-C   30 mg at 04/13/16 1013  . levothyroxine (SYNTHROID, LEVOTHROID) tablet 175 mcg  175 mcg Oral QAC breakfast Beatrice Lecher, PA-C   175 mcg at 04/13/16 0321  . metoprolol tartrate (LOPRESSOR) tablet 12.5 mg  12.5 mg Oral BID Little Ishikawa, NP   12.5 mg at 04/13/16 1013  . mometasone-formoterol (DULERA) 200-5 MCG/ACT inhaler 2 puff  2 puff Inhalation BID Beatrice Lecher, PA-C   2 puff at 04/13/16 1034  . ondansetron (ZOFRAN) injection 4 mg  4 mg Intravenous Q6H PRN Beatrice Lecher, PA-C   4 mg at 04/08/16 0133  . potassium chloride SA (K-DUR,KLOR-CON) CR tablet 40 mEq  40 mEq Oral BID Beatrice Lecher, PA-C   40 mEq at 04/13/16 1014  .  sodium chloride flush (NS) 0.9 % injection 3 mL  3 mL Intravenous Q12H Beatrice Lecher, PA-C   3 mL at 04/13/16 1000  . sodium chloride flush (NS) 0.9 % injection 3 mL  3 mL Intravenous PRN Beatrice Lecher, PA-C      . sodium chloride flush (NS) 0.9 % injection 3 mL  3 mL Intravenous Q12H Lars Masson, MD   3 mL at 04/12/16 1000  . sodium chloride flush (NS) 0.9 % injection 3 mL  3 mL Intravenous PRN Lars Masson, MD      . tamsulosin [facility]) capsule 0.4 mg  0.4 mg Oral Daily Beatrice Lecher, PA-C   0.4 mg at 04/13/16 1013  . triamcinolone cream (KENALOG) 0.5 %   Topical BID Beatrice Lecher, PA-C      . warfarin (COUMADIN) tablet 4 mg  4 mg Oral ONCE-1800 Gerilyn Nestle, RPH      . Warfarin - Pharmacist Dosing Inpatient   Does not apply q1800 Herby Abraham, [facility]         PE: General appearance: alert, cooperative and no distress Neck: no carotid bruit and elevated JVD Lungs: clear to auscultation bilaterally Heart: regular rate and rhythm, S1, S2 normal, no murmur, click, rub or gallop Extremities: 2+ bilateral LEE pitting edema, R>L Pulses: 2+ and symmetric Skin: warm and dry Neurologic: Grossly normal  Lab Results:   Recent Labs  04/13/16 0709  WBC 6.9  HGB 9.7*  HCT 32.6*  PLT 180   BMET  Recent Labs  04/11/16 0224 04/12/16 0224 04/13/16 0709  NA 147* 147* 145  K 4.6 4.2 3.9  CL 110 109 107  CO2 27 25 25   GLUCOSE 119* 105* 135*  BUN 36* 34* 34*  CREATININE 2.26* 2.09* 2.18*  CALCIUM 8.9 8.9 9.1   PT/INR  Recent Labs  04/11/16 0224 04/12/16 0224 04/13/16 0709  LABPROT 28.1* 29.1* 30.2*  INR 2.68* 2.81* 2.95*   Filed Weights   04/12/16 0300 04/12/16 0732 04/13/16 0635  Weight: 216 lb 11.3 oz (98.296 kg) 216 lb (97.977 kg) 209 lb 4.8 oz (94.938 kg)    Assessment/Plan  Principal Problem:   Acute on chronic systolic and diastolic heart failure, NYHA class 4 (HCC) Active Problems:   DM (diabetes mellitus), type 2 with renal complications (HCC)   HLD (hyperlipidemia)   CKD (chronic kidney disease) stage 3, GFR 30-59 ml/min   Non-occlusive coronary artery disease   Anticoagulated on Coumadin   NICM (nonischemic cardiomyopathy) (HCC)   Hypernatremia   Anemia in chronic renal disease   Chronic atrial fibrillation (HCC)   ICD (implantable cardioverter-defibrillator), biventricular, in situ   Dementia   Hypertensive cardiomyopathy (HCC)   1. Acute on Chronic Systolic and Diastolic HF: Echo shows EF reduced to 20-25%, from 30-35% in 02/2015. Frank Mclean is still volume overloaded after receiving several liters of fluid for hypernatremia. - 1.7 L out yesterday. Net negative 5L total since admit. Weight is down 7 lb from 216 yesterday to 209 today. He continues to have episodes of acute dyspnea at rest with brief periods  of hypoxia and continues to have significant peripheral edema. Also with worsening renal function and limited medical options for systolic HF. ? Low output HF. He may benefit from Advanced HF consultation. Will discuss this with Dr. Herbie Baltimore. Continue IV lasix, BB, nitrate + hydralazine, low sodium diet, strict I/Os and daily weights.   2. PAF/ Flutter: was in atrial flutter on admit. S/p  DCCV 04/12/16 with conversion back to NSR. Telemetry shows paced rhythm with p waves. HR is well controlled. Continue BB for rate control. ? Converting from lopressor to long acting metoprolol vs carvediolol given significant LV systolic dysfunction.   3. NICM: EF 20-25%. Nonobstructive CAD by cath 03/2011.  He is on a BB but on short acting metoprolol. Consider switching to long acting metoprolol vs carvedilol or bisoprolol. He is not a candidate for ACE/ARB nor spiro or digoxin given baseline renal function. Continue hydralazine + nitrate for afterload reduction. ? Advanced HF consult.   4. BiV Pacer: followed by Dr. Ladona Ridgel  5. CKD: SCr at 2.18 which appears to be his baseline. On lasix for HF but no other nephrotoxic agents.   6. HLD: controlled with statin therapy. Recent LP 03/19/16 showed satisfactory levels: LDL 52. TG 101. HDL however was low at 25. Continue Lipitor 20 mg nightly.   7. H/o CVA: also with h/o afib/flutter. He is on chronic anticoagulation with Coumadin. INR is therapeutic.   8. Chronic Anticoagulation: Coumadin for h/o afib/flutter and CVA. INR is therapeutic at 2.9. Goal is 2-3.      LOS: 7 days    Brittainy M. Delmer Islam 04/13/2016 12:18 PM  I have seen, examined and evaluated the patient this PM along with Ms. Sharol Harness, PA-c.  After reviewing all the available data and chart,  I agree with her findings, examination as well as impression recommendations.  Frank Mclean these feeling that her, but he did have some episodes of rest dyspnea with oxygen desaturations. He appeared to be  maintaining a Baseline normal sinus rhythm with ventricular pacing.. Clinically seems to be improving, but he still has notable edema on the right greater than left leg and has JVD on exam. Is a decent diuresis based on his Foley bag.  Renal function is actually stable and bicarbonate is yet to rise.interestingly, despite having continued urinary output he has not lost any weight according to the scales. Only the initial 7 pounds.  he has severely reduced ejection fraction, likely related to recent onset of A. Fib clearly set up his exacerbation. I think at this point he still has volume overload and we would continue IV Lasix today probably imminent tomorrow and reassess.y inclination is to go for Complete diuresis, potentially looking for contraction alkalosis as the guide.   I'm converting him from Lopressor to carvedilol.  Continue statin and Coumadin.  He has bilateral ankle ulcers that are stable. The left is pretty well-healed, the right is not as much healed as the left. - Currently neither one needs dressing.   The patient is actually followed by Dr. Wynelle Bourgeois Dr. Antoine Poche in the outpatient setting. I will ask Dr. Shirlee Latch for any additional opinions on how we should go forward with treatment.  Unfortunately, the original plan was to possibly consider discharge today or tomorrow, but I think this may be just been begun to soon based on his severity of cardiomyopathy and continued evidence of volume overload.  We will reassess after IV dose of Lasix tomorrow and consider switching to increased dose of oral torsemide prior to discharge. I would like to see that he has brisk urine output with his dose prior to discharge.  Currently physical therapy recommends home health PT.We will ask start planning to help arrange that.    Marykay Lex, M.D., M.S. Interventional Cardiologist   Pager # [phone] Phone # [phone] [address] [place], [place] [address]

## 2016-04-13 NOTE — Progress Notes (Signed)
ANTICOAGULATION CONSULT NOTE - FOLLOW UP  Pharmacy Consult:  Coumadin Indication: atrial fibrillation  Allergies  Allergen Reactions  . Levothyroxine Sodium Other (See Comments)    MUST TAKE BRAND NAME GENERIC DOESN'T WORK FOR PATIENT    Patient Measurements: Height: 6\' 2"  (188 cm) Weight: 209 lb 4.8 oz (94.938 kg) (a scale) IBW/kg (Calculated) : 82.2  Vital Signs: Temp: 97.9 F (36.6 C) (04/25 0635) Temp Source: Oral (04/25 0635) BP: 115/87 mmHg (04/25 0635) Pulse Rate: 93 (04/25 0635)  Labs:  Recent Labs  04/11/16 0224 04/12/16 0224  LABPROT 28.1* 29.1*  INR 2.68* 2.81*  CREATININE 2.26* 2.09*    Estimated Creatinine Clearance: 35 mL/min (by C-G formula based on Cr of 2.09).   Assessment: 38 YOM with chronic systolic CHF admitted with complaint of leg swelling.  Patient continues on Coumadin from PTA for history of Afib.  INR remains therapeutic and trending up.  No bleeding reported.   Goal of Therapy:  INR 2-3    Plan:  - Coumadin 4mg  PO today - Daily PT / INR    Moriyah Byington D. Laney Potash, PharmD, BCPS Pager:  [phone] 04/13/2016, 9:24 AM

## 2016-04-13 NOTE — Progress Notes (Signed)
Pt refused AM labs. RN instructed lab to return later this AM for labs.

## 2016-04-13 NOTE — Progress Notes (Signed)
Nutrition Follow-up  INTERVENTION:  Continue Ensure Enlive po BID, each supplement provides 350 kcal and 20 grams of protein Encourage PO intake   NUTRITION DIAGNOSIS:   Inadequate oral intake related to poor appetite as evidenced by per patient/family report, mild depletion of body fat, moderate depletions of muscle mass.  Ongoing  GOAL:   Patient will meet greater than or equal to 90% of their needs  Being met  MONITOR:   PO intake, Labs, Skin, Weight trends, I & O's  REASON FOR ASSESSMENT:   Malnutrition Screening Tool    ASSESSMENT:   75 y.o. male with a hx of chronic systolic HF felt secondary to hypertension, paroxysmal atrial fibrillation, DM, stroke/TIA, peripheral neuropathy, HLD, CKD stage III, LBBB, and hypothyroidism,.Wife Called the office yesterday with increasing lower extremity edema and increasing confusion.  Pt states that he is eating all that he can- usually all the vegetables and most of the protein on meal trays. He states that he is drinking Ensure and milk in between meals. Per nursing notes, pt is eating 50% of most meals, 80-100% of some. Pt's weight is stable at 209 lbs. He continues to have LE edema. RD encourage pt to continue eating and drinking Ensure.   Labs: glucose ranging 91 to 135 mg/dL, low hemoglobin  Diet Order:  Diet Heart Room service appropriate?: Yes; Fluid consistency:: Thin  Skin:  Wound (see comment) (stage II pressure ulcer on buttocks)  Last BM:  4/23  Height:   Ht Readings from Last 1 Encounters:  04/12/16 '6\' 2"'$  (1.88 m)    Weight:   Wt Readings from Last 1 Encounters:  04/13/16 209 lb 4.8 oz (94.938 kg)    Ideal Body Weight:  87.7 kg  BMI:  Body mass index is 26.86 kg/(m^2).  Estimated Nutritional Needs:   Kcal:  2100-2300  Protein:  110-130 grams  Fluid:  2.1-2.3 L/day  EDUCATION NEEDS:   No education needs identified at this time  [place], LDN Inpatient Clinical Dietitian Pager:  [phone] After Hours Pager: [phone]

## 2016-04-14 ENCOUNTER — Other Ambulatory Visit: Payer: Self-pay

## 2016-04-14 ENCOUNTER — Encounter (HOSPITAL_COMMUNITY): Payer: Self-pay | Admitting: Cardiovascular Disease

## 2016-04-14 LAB — PROTIME-INR
INR: 2.9 — AB (ref 0.00–1.49)
PROTHROMBIN TIME: 29.8 s — AB (ref 11.6–15.2)

## 2016-04-14 MED ORDER — WARFARIN SODIUM 2.5 MG PO TABS
2.5000 mg | ORAL_TABLET | Freq: Once | ORAL | Status: AC
  Administered 2016-04-14: 2.5 mg via ORAL
  Filled 2016-04-14: qty 1

## 2016-04-14 MED ORDER — CARVEDILOL 6.25 MG PO TABS
6.2500 mg | ORAL_TABLET | Freq: Two times a day (BID) | ORAL | Status: DC
  Administered 2016-04-14 – 2016-04-15 (×3): 6.25 mg via ORAL
  Filled 2016-04-14 (×3): qty 1

## 2016-04-14 MED ORDER — TORSEMIDE 20 MG PO TABS
40.0000 mg | ORAL_TABLET | Freq: Every day | ORAL | Status: DC
  Administered 2016-04-14 – 2016-04-15 (×2): 40 mg via ORAL
  Filled 2016-04-14 (×2): qty 2

## 2016-04-14 NOTE — Progress Notes (Signed)
Heart Failure Navigator Consult Note  Presentation: Frank Mclean is a 75 y.o. 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 Echo 30-35%, grade 3 DD, mod MR, mod LAE, mod reduced RV function, severely dilated RA, PASP . He continued to have class III symptoms and eventually was referred to Dr. Laneta Simmers in 8/16 for consideration of LV epicardial lead placement. This was undertaken in 9/16 with revision of CRT system. Last seen by Dr. Ladona Ridgel in 12/16 was somewhat improved symptoms.  Admitted 3/14-3/19 with hypernatremia and altered mental status in the setting of dehydration and probable UTI and worsening dementia. Sodium was 155 upon admission and creatinine was 2.1. He was noted to have nonadherence to medications recently due to worsening dementia. He was hydrated with D5W. Sodium improved to 140 at discharge. He was discharged to [facility]. Discharged to home 03/31/16. Wife Called the office yesterday with increasing lower extremity edema and increasing confusion. She was advised to take him to the emergency room but decided to wait until today.  His functional status and memory has deteriorated quite a bit in that time. His wife helps with the hx. His weight is up ~ 6 lbs since leaving [employer] several days ago. He notes DOE with minimal activity. LE edema is worse as well as abdominal bloating. He has been wheezing. Denies orthopnea, PND. Denies chest pain. He is not eating well at all. She is not sure if he takes his medications. Since leaving [facility], she has been giving him Torsemide 10 mg alt with 20 mg every other day along with K+ 30 mEq  bid.   Past Medical History  Diagnosis Date  . Gout   . HTN (hypertension)   . Hyperlipidemia   . Osteoarthritis   . GERD (gastroesophageal reflux disease)   . DJD (degenerative joint disease)   . BPH (benign prostatic hypertrophy)   . Peripheral vascular disease (HCC)   . Bilateral leg ulcer (HCC)     ACHILLES AREA--  NONHEALING  . CKD (chronic kidney disease) stage 3, GFR 30-59 ml/min   . CAD (coronary artery disease)     a. LHC 4/12: Mid LAD 25%, mid diagonal 30%, AV circumflex 40%, proximal OM 25%, distal RCA 40%  . Chronic combined systolic and diastolic heart failure (HCC)     a. Echo 05/2013: EF 25-30%, diffuse HK, restrictive physiology, trivial AI, mild MR, moderate LAE, reduced RV systolic function, PASP 42  . LBBB (left bundle branch block)   . PAF (paroxysmal atrial fibrillation) (HCC)     a. amiodarone Rx started 05/2013;  b. chronic coumadin  . NICM (nonischemic cardiomyopathy) (HCC)     a. EF 25-30%.  [name] Hx of cardiovascular stress test     Nuclear Stress Test (1/16): High risk stress nuclear study with a large, severe, partially reversible inferior and apical defect consistent with prior inferior and apical infarct; mild apical ischemia; severe LVE; study high risk due to reduced LV function.  EF 25% >> reviewed with Dr. Antoine Poche >> medical mgmt  . Hypothyroidism   . Presence of permanent cardiac pacemaker   . AICD (automatic cardioverter/defibrillator) present   . Chronic combined systolic and diastolic CHF (congestive heart  failure) (HCC)   . Shortness of breath dyspnea   . Pneumonia   . Chronic depression   . Hypernatremia   . Memory loss   . Anticoagulated on Coumadin   . Anemia in chronic renal disease   . Elevated troponin   . Hyperbilirubinemia   . Physical deconditioning   . Acute cystitis without hematuria   . Hypokalemia   . Anemia of chronic disease   . Loose stools   . Hyperkalemia   . Hypokalemia   . On home oxygen therapy     "2L at night"  (04/06/2016)  . CVA (cerebral vascular accident) [facility])     Left pontine infarct July 2004; changed from Plavix to Coumadin in 2004 per MD at [facility]  . DM (diabetes mellitus), type 2 with renal complications (HCC)   . Gout     Social History   Social History  . Marital Status: Married    Spouse Name: N/A  . Number of Children: N/A  . Years of Education: N/A   Occupational History  . Retired - Working in Product manager    Social History Main Topics  . Smoking status: Former Smoker    Quit date: 04/12/1974  . Smokeless tobacco: Never Used  . Alcohol Use: No     Comment: previously drank - "love cognac" quit 15 yrs ago.  . Drug Use: No  . Sexual Activity: No   Other Topics Concern  . None   Social History Narrative   Worked at [employer] year.  He is married and lives with his wife [name] in [place].  Has one daughter Lowella Bandy    ECHO:Study Conclusions-04/08/16  - Left ventricle: The cavity size was moderately dilated. There was  mild focal basal and mild concentric hypertrophy of the septum.  Systolic function was severely reduced. The estimated ejection  fraction was in the range of 20% to 25%. Diffuse hypokinesis.  Doppler parameters are consistent with restrictive physiology,  indicative of decreased left ventricular diastolic compliance  and/or increased left atrial pressure. Doppler parameters are  consistent with elevated ventricular end-diastolic filling  pressure. - Ventricular septum: Septal motion showed paradox. - Mitral valve: There was mild regurgitation directed centrally. - Left atrium: The atrium was moderately dilated. - Right ventricle: The cavity size was moderately dilated. Wall  thickness was normal. Systolic function was moderately reduced. - Right atrium: The atrium was severely dilated. - Tricuspid valve: There was moderate-severe regurgitation. - Pulmonary arteries: PA peak pressure: 51 mm Hg (S).  Impressions:  - When  compared to the prior study from 02/26/2015 LVEF has further  deteriorated, now 15-20%, previously 30-35%.  RVEF remains severely depressed.  Transthoracic echocardiography. M-mode, complete 2D, spectral Doppler, and color Doppler. Birthdate: Patient birthdate: [birth year]. Age: Patient is 75 yr old. Sex: Gender: male. BMI: 26.5 kg/m^2. Blood pressure: 118/84 Patient status: Inpatient. Study date: Study date: 04/08/2016. Study time: 11:18 AM. Location: Bedside.  BNP    Component Value Date/Time   BNP 1253.1* 03/03/2016 0513    ProBNP    Component Value Date/Time   PROBNP 539.0* 06/20/2015 1301     Education Assessment and Provision:  Detailed education and instructions provided on heart failure disease management including the following:  Signs and symptoms of Heart Failure When to call the physician Importance of daily weights Low sodium diet Fluid restriction Medication management Anticipated future follow-up appointments  Patient education given on each of the above topics.  Patient acknowledges understanding and acceptance of all  instructions.  Mr Mazurowski seems confused and answered some of my questioning inappropriately.  I will plan to come back and speak to he and his wife when she is available.  Education Materials:  "Living Better With Heart Failure" Booklet, Daily Weight Tracker Tool    High Risk Criteria for Readmission and/or Poor Patient Outcomes:   EF <30%- yes 20-25%  2 or more admissions in 6 months- yes 2/6 mo  Difficult social situation- No  Demonstrates medication noncompliance- No    Barriers of Care:  Knowledge and compliance  Discharge Planning:   Plans to return to home with wife?  May need SNF again at time of discharge?[name]

## 2016-04-14 NOTE — Progress Notes (Signed)
ANTICOAGULATION CONSULT NOTE - FOLLOW UP  Pharmacy Consult:  warfarin Indication: atrial fibrillation  Allergies  Allergen Reactions  . Levothyroxine Sodium Other (See Comments)    MUST TAKE BRAND NAME GENERIC DOESN'T WORK FOR PATIENT   Patient Measurements: Height: 6\' 2"  (188 cm) Weight: 203 lb 6.4 oz (92.262 kg) (a scale) IBW/kg (Calculated) : 82.2  Vital Signs: Temp: 98.9 F (37.2 C) (04/26 0536) Temp Source: Oral (04/26 0536) BP: 136/100 mmHg (04/26 0536) Pulse Rate: 90 (04/26 0536)  Labs:  Recent Labs  04/12/16 0224 04/13/16 0709 04/14/16 0244  HGB  --  9.7*  --   HCT  --  32.6*  --   PLT  --  180  --   LABPROT 29.1* 30.2* 29.8*  INR 2.81* 2.95* 2.90*  CREATININE 2.09* 2.18*  --     Estimated Creatinine Clearance: 33.5 mL/min (by C-G formula based on Cr of 2.18).  Assessment: 65 YOM with chronic systolic CHF admitted with complaint of leg swelling.  Patient continues on Coumadin from PTA for history of Afib.  Home dose: 5mg  daily except 2.5mg  on MWF.  Hgb 9.7, plts 180- no bleeding noted.  Goal of Therapy:  INR 2-3   Plan:  - Coumadin 2.5mg  PO today as per home dose - Daily PT / INR  Ioma Chismar D. Franklin Baumbach, PharmD, BCPS Clinical Pharmacist Pager: [phone] 04/14/2016 12:11 PM

## 2016-04-14 NOTE — Progress Notes (Signed)
Physical Therapy Treatment Patient Details Name: Frank Mclean MRN: [id number] DOB: Nov 29, 1941 Today's Date: 04/14/2016    History of Present Illness Frank Mclean is a 16M with hyperlipidemia, s/p BiV ICD, paroxysmal atrial fibrillation, non-obstructive CAD, hypertension, hyperlipidemia, diabetes, prior stroke, LBBB, dementia and CKD stage 3 here with acute on chronic systolic and diastolic heart failure.    PT Comments    Frank Mclean made good progress today, ambulating 100 ft using RW w/ min guard assist.  SpO2 as low as 82% on RA while ambulating, RN made aware.  Pt will benefit from continued skilled PT services to increase functional independence and safety.   Follow Up Recommendations  Home health PT;Supervision/Assistance - 24 hour     Equipment Recommendations  3in1 (PT)    Recommendations for Other Services OT consult     Precautions / Restrictions Precautions Precautions: Fall Restrictions Weight Bearing Restrictions: No    Mobility  Bed Mobility               General bed mobility comments: Pt sitting in recliner chair upon PT arrival  Transfers Overall transfer level: Needs assistance Equipment used: Rolling walker (2 wheeled) Transfers: Sit to/from Stand Sit to Stand: Min guard         General transfer comment: Requires cues to scoot to edge of seat prior to standing.  Pt is slow to stand, min guard assist for safety.  Cues to turn completely prior to sitting in chair, pt attempts to sit prematurely.  Ambulation/Gait Ambulation/Gait assistance: Min guard Ambulation Distance (Feet): 100 Feet Assistive device: Rolling walker (2 wheeled) Gait Pattern/deviations: Decreased stride length;Shuffle;Trunk flexed   Gait velocity interpretation: Below normal speed for age/gender General Gait Details: Flexed posture w/ slight improvement following verbal cues.  SpO2 as low as 82% on RA, RN notified.  Pt initially shuffling his feet and requires verbal cues for foot  clearance.   Stairs            Wheelchair Mobility    Modified Rankin (Stroke Patients Only)       Balance Overall balance assessment: Needs assistance Sitting-balance support: No upper extremity supported;Feet supported Sitting balance-Leahy Scale: Good     Standing balance support: During functional activity;Bilateral upper extremity supported Standing balance-Leahy Scale: Poor Standing balance comment: Uses RW for support                    Cognition Arousal/Alertness: Awake/alert Behavior During Therapy: WFL for tasks assessed/performed Overall Cognitive Status: No family/caregiver present to determine baseline cognitive functioning                      Exercises General Exercises - Lower Extremity Ankle Circles/Pumps: AROM;Both;10 reps;Seated Long Arc Quad: AROM;Both;10 reps;Seated    General Comments General comments (skin integrity, edema, etc.): Pt reports he will be staying w/ his stepfather at d/c and his brother lives 4 houses down.  Both will be able provide assist/supervision at d/c.  Emphasized to pt the importance of keeping his LEs elevated, pt verbalized understanding.      Pertinent Vitals/Pain Pain Assessment: No/denies pain    Home Living                      Prior Function            PT Goals (current goals can now be found in the care plan section) Acute Rehab PT Goals Patient Stated Goal: agreeable to amb PT Goal  Formulation: With patient Time For Goal Achievement: 04/25/16 Potential to Achieve Goals: Good Progress towards PT goals: Progressing toward goals    Frequency  Min 3X/week    PT Plan Current plan remains appropriate    Co-evaluation             End of Session Equipment Utilized During Treatment: Gait belt Activity Tolerance: Patient tolerated treatment well;Treatment limited secondary to medical complications (Comment) (hypoxia) Patient left: in chair;with call bell/phone within  reach;with chair alarm set     Time: 5784-6962 PT Time Calculation (min) (ACUTE ONLY): 21 min  Charges:  $Gait Training: 8-22 mins                    G Codes:      Encarnacion Chu PT, DPT  Pager: [phone] Phone: [phone] 04/14/2016, 4:01 PM

## 2016-04-14 NOTE — Discharge Summary (Signed)
Discharge Summary    Patient ID: Frank Mclean,  MRN: [id number], DOB/AGE: 1941-03-02 75 y.o.  Admit date: 04/06/2016 Discharge date: 04/15/2016  Primary Care Provider: [name] Primary Cardiologist: Dr. Antoine Poche  Electrophysiologist: Dr. Ladona Ridgel CHF: Dr. Shirlee Latch  Discharge Diagnoses    Principal Problem:   Acute on chronic systolic and diastolic heart failure, NYHA class 4 (HCC) Active Problems:   DM (diabetes mellitus), type 2 with renal complications (HCC)   HLD (hyperlipidemia)   CKD (chronic kidney disease) stage 3, GFR 30-59 ml/min   Non-occlusive coronary artery disease   Anticoagulated on Coumadin   NICM (nonischemic cardiomyopathy) (HCC)   Hypernatremia   Anemia in chronic renal disease   Chronic atrial fibrillation (HCC)   ICD (implantable cardioverter-defibrillator), biventricular, in situ   Dementia   Hypertensive cardiomyopathy (HCC)   Pressure ulcer of coccygeal region, stage 1   Allergies Allergies  Allergen Reactions  . Levothyroxine Sodium Other (See Comments)    MUST TAKE BRAND NAME GENERIC DOESN'T WORK FOR PATIENT    Diagnostic Studies/Procedures    Study Conclusions  - Left ventricle: The cavity size was moderately dilated. There was  mild focal basal and mild concentric hypertrophy of the septum.  Systolic function was severely reduced. The estimated ejection  fraction was in the range of 20% to 25%. Diffuse hypokinesis.  Doppler parameters are consistent with restrictive physiology,  indicative of decreased left ventricular diastolic compliance  and/or increased left atrial pressure. Doppler parameters are  consistent with elevated ventricular end-diastolic filling  pressure. - Ventricular septum: Septal motion showed paradox. - Mitral valve: There was mild regurgitation directed centrally. - Left atrium: The atrium was moderately dilated. - Right ventricle: The cavity size was moderately dilated. Wall  thickness was  normal. Systolic function was moderately reduced. - Right atrium: The atrium was severely dilated. - Tricuspid valve: There was moderate-severe regurgitation. - Pulmonary arteries: PA peak pressure: 51 mm Hg (S).  Impressions:  - When compared to the prior study from 02/26/2015 LVEF has further  deteriorated, now 15-20%, previously 30-35%.  RVEF remains severely depressed.  CHEST 2 VIEW  COMPARISON: March 02, 2016  FINDINGS: There is no appreciable edema or consolidation. There is generalized cardiomegaly with pulmonary venous hypertension. Pacemaker lead positions are unchanged. No adenopathy. No pneumothorax. No bone lesions.  IMPRESSION: Stable pulmonary vascular congestion. No frank edema or consolidation. Stable and apparently intact pacemaker leads. _____________   History of Present Illness     Frank Mclean is a 75 y.o. male with a hx of chronic systolic HF felt secondary to hypertension, s/p [employer] ICD placement 02/2015 with failed attempt at LV lead placement (consideration of epicardial lead but he was felt too high risk for this), paroxysmal atrial fibrillation on chronic Coumadin, nonobstructive CAD by cath 03/2011, HTN, DM, stroke/TIA, peripheral neuropathy, HLD, CKD stage III, LBBB, hypothyroidism, PAD. Echo 30-35%, grade 3 DD, mod MR, mod LAE, mod reduced RV function, severely dilated RA, PASP . He continued to have class III symptoms and eventually was referred to Dr. Laneta Simmers in 8/16 for consideration of LV epicardial lead placement. This was undertaken in 9/16 with revision of CRT system. Last seen by Dr. Ladona Ridgel in 12/16 was somewhat improved symptoms. He has also been followed by Dr. Antoine Poche and Dr. Shirlee Latch.  He was admitted 3/14-3/19 with hypernatremia and altered mental status in the setting of dehydration and probable UTI and worsening dementia. Sodium was 155 upon admission and creatinine was 2.1. He was  noted to have nonadherence to medications  recently due to worsening dementia. He was hydrated with D5W. Sodium improved to 140 at discharge. He was discharged to [facility]. Discharged to home 03/31/16.  On 04/05/16, he presented to [facility] with complaint of increased LEE. His weight was up ~6lb since leaving [facility]. He also complained of DOE with minimal activity. Based on assessment, it was felt that he was in acute on chronic combined systolic + diastolic HF. He was also seen by Dr. Elease Hashimoto in clinic who agreed. He was subsequently admitted for management with IV diuretics.   Hospital Course     Patient was admitted to HF telemetry floor and initially placed on 80 mg IV Lasix which was later increased to 120 mg twice daily. His hypernatremia improved..  Patient ultimately diuresed 9.5 L.  He was ultimately changed to 40 mg of torsemide daily.  TED were placed on his lower extremities.  He had a new echocardiogram which revealed his ejection fraction was 20-25% which is reduced from prior 30-35%. He was not placed on Ace or ARB due to chronic kidney disease. Beta blocker was added. He was continued on hydralazine and Imdur.  At admission he was in 2-1 atrial flutter. Device interrogation showed normal function, has been in AFL since 12/2015. AT/AF burden is 81%.  CHA2DS2-VASc Score and unadjusted Ischemic Stroke Rate (% per year) is equal to 10.8 % stroke rate/year from a score of 8.  He is chronically anticoagulated with Coumadin.  He underwent DC CV on April 24 converted to normal sinus rhythm.  Patient was assessed by physical therapy who recommended home health PT and 24-hour supervision.  On the day before discharge [name] ambulated 100 feet with a rolling walker and minimal assistance. O2 saturation dropped to 82% on room air. He'll be continued on home oxygen.  He has a small stege 1 pressure ulcer on his coccygeal area.  A padded dressing was applied.  The patient was seen by Dr. Herbie Baltimore who felt he was stable for DC home.   Follow-up appointment in CHF clinic arranged.    OP needs: BMET at OV.     Consultants: Physical therapy   _____________  Discharge Vitals Blood pressure 141/97, pulse 80, temperature 97.3 F (36.3 C), temperature source Oral, resp. rate 16, height 6\' 2"  (1.88 m), weight 215 lb 14.1 oz (97.922 kg), SpO2 89 %.  Filed Weights   04/13/16 0635 04/14/16 0536 04/15/16 0404  Weight: 209 lb 4.8 oz (94.938 kg) 203 lb 6.4 oz (92.262 kg) 215 lb 14.1 oz (97.922 kg)    Labs & Radiologic Studies    CBC  Recent Labs  04/13/16 0709 04/15/16 0312  WBC 6.9 6.6  HGB 9.7* 9.7*  HCT 32.6* 32.7*  MCV 80.5 81.1  PLT 180 180   Basic Metabolic Panel  Recent Labs  04/13/16 0709 04/15/16 0312  NA 145 152*  K 3.9 3.3*  CL 107 111  CO2 25 30  GLUCOSE 135* 124*  BUN 34* 30*  CREATININE 2.18* 1.92*  CALCIUM 9.1 8.9   _____________  Dg Chest 2 View  04/07/2016  CLINICAL DATA:  Shortness of Breath EXAM: CHEST  2 VIEW COMPARISON:  March 02, 2016 FINDINGS: There is no appreciable edema or consolidation. There is generalized cardiomegaly with pulmonary venous hypertension. Pacemaker lead positions are unchanged. No adenopathy. No pneumothorax. No bone lesions. IMPRESSION: Stable pulmonary vascular congestion. No frank edema or consolidation. Stable and apparently intact pacemaker leads.  Electronically Signed   By: Bretta Bang III M.D.   On: 04/07/2016 07:45   Disposition   Pt is being discharged home today in good condition.  Follow-up Plans & Appointments    Follow-up Information    Follow up with Marca Ancona, MD On 04/22/2016.   Specialty:  Cardiology   Why:  2:30 PM (Parking garrage code [phone])   Contact information:   [address] [place] [address] [phone]       Follow up with [facility].   Specialty:  Home Health Services   Why:  They will do your home health care at your home   Contact information:   [address] [place] [address] [phone]      Discharge Instructions    AMB Referral to [facility] Care Management    Complete by:  As directed   Reason for consult:  Post hospital monitoring; Patient is a re-admission and from skilled nursing  Diagnoses of:  Heart Failure  Expected date of contact:  1-3 days (reserved for hospital discharges)  Please assign to community nurse for transition of care calls and assess for home visits. Patient is a re-admission, HX with [facility] now home with [facility] with [facility] at discharge.   Questions please call:   Charlesetta Shanks, RN BSN CCM [facility]  [phone] business mobile phone Toll free office [phone]     Diet - low sodium heart healthy    Complete by:  As directed      Discharge instructions    Complete by:  As directed   Monitor your weight every morning.  If you gain 3 pounds in 24 hours, or 5 pounds in a week, call the office for instructions.     Increase activity slowly    Complete by:  As directed            Discharge Medications   Current Discharge Medication List    CONTINUE these medications which have CHANGED   Details  torsemide (DEMADEX) 20 MG tablet Take 2 tablets (40 mg total) by mouth daily. Qty: 30 tablet, Refills: 11      CONTINUE these medications which have NOT CHANGED   Details  acetaminophen (TYLENOL) 325 MG tablet Take 1 tablet (325 mg total) by mouth every 6 (six) hours as needed for mild pain (or Fever >/= 101).    atorvastatin (LIPITOR) 20 MG tablet Take 1 tablet (20 mg total) by mouth daily. Qty: 90 tablet, Refills: 3   Associated Diagnoses: Coronary artery disease involving native coronary artery of native heart without angina pectoris; HLD (hyperlipidemia)    carvedilol (COREG) 6.25 MG tablet Take 1 tablet (6.25 mg total) by mouth 2 (two) times daily with a meal. Qty: 60 tablet, Refills: 6    Cholecalciferol (VITAMIN D-3 PO) Take 1,000 Units by mouth daily.     escitalopram (LEXAPRO) 5 MG tablet TAKE 1 TABLET BY MOUTH EVERY DAY Qty: 30 tablet, Refills: 3    ferrous sulfate 325 (65 FE) MG tablet Take 1 tablet (325 mg total) by mouth daily. Qty: 30 tablet, Refills: 11    hydrALAZINE (APRESOLINE) 25 MG tablet Take 1 tablet (25 mg total) by mouth 3 (three) times daily.    HYDROcodone-acetaminophen (NORCO/VICODIN) 5-325 MG tablet Take 1 tablet by mouth every 8 (eight) hours as needed for moderate pain. Do not exceed 4gm of Tylenol in 24 hours Qty: 90 tablet, Refills: 0  isosorbide mononitrate (IMDUR) 30 MG 24 hr tablet Take 1 tablet (30 mg total) by mouth 2 (two) times daily.    levothyroxine (SYNTHROID, LEVOTHROID) 175 MCG tablet Take 175 mcg by mouth daily before breakfast.    Menthol, Topical Analgesic, (BIOFREEZE) 4 % GEL Apply topically 2 (two) times daily. Apply to left shoulder and left ankle    OXYGEN Inhale 2.5 L/min into the lungs See admin instructions. USES OXYGEN EVERY BEDTIME USES DURING DAY ONLY AS NEEDED FOR SHORTNESS OF BREATH    potassium chloride SA (K-DUR,KLOR-CON) 20 MEQ tablet Take 20 mEq by mouth daily.     SYMBICORT 160-4.5 MCG/ACT inhaler INHALE 2 PUFFS INTO THE LUNGS 2 TIMES DAILY Qty: 10.2 Inhaler, Refills: 5    tamsulosin (FLOMAX) 0.4 MG CAPS capsule Take 1 capsule (0.4 mg total) by mouth daily. Qty: 30 capsule, Refills: 1    triamcinolone cream (KENALOG) 0.5 % Apply topically 2 (two) times daily. Qty: 60 g, Refills: 2    vitamin B-12 (CYANOCOBALAMIN) 1000 MCG tablet Take 1,000 mcg by mouth daily.    warfarin (COUMADIN) 5 MG tablet Take 1 tablet by mouth daily or as directed by coumadin clinic. Qty: 30 tablet, Refills: 0           Outstanding Labs/Studies     Duration of Discharge Encounter   Greater than 30 minutes including physician time.  Signed, Wilburt Finlay PA-C 04/15/2016, 2:42 PM     I have seen, examined and evaluated the patient this PM along with Mr. Leron Croak, [place]. After reviewing  all the available data and chart, I agree with his findings, examination as well as impression recommendations.  He actually looks much better today. Continue diuresis on oral torsemide. He still has some edema in the right leg, but has improved. Was better with compression stockings on board. Recommend continued compression stockings. Small decubitus lesion that doesn't quite meet criteria for decubitus ulcer. Agree with the dressing recommendations.  His blood pressures little high today, but this the first time as been so high. We can further adjust medications as an outpatient. Agree with discharge today on current medications as noted above. This essentially my recommendations and plan from yesterday with slight adjustments for today.  Plan: Discharge today. He has close follow-up scheduled in heart failure clinic. I did increase his torsemide from home dose. Would consider possibly consists checking a chemistry panel on the day of follow-up.    Marykay Lex, M.D., M.S. Interventional Cardiologist   Pager # [phone] Phone # [phone] [address] [place], [place] [address]

## 2016-04-14 NOTE — Progress Notes (Signed)
Patient Profile: Frank Mclean is a 62M with hyperlipidemia, s/p BiV ICD, paroxysmal atrial fibrillation, non-obstructive CAD, hypertension, hyperlipidemia, diabetes, prior stroke, LBBB, dementia and CKD stage 3 here with acute on chronic systolic and diastolic heart failure.   Subjective: Feels better today. Out of bed and in chair. He denies any resting dyspnea. Still with mild DOE.  Objective: Vital signs in last 24 hours: Temp:  [97.9 F (36.6 C)-98.9 F (37.2 C)] 97.9 F (36.6 C) (04/26 1218) Pulse Rate:  [86-93] 86 (04/26 1218) Resp:  [17-20] 20 (04/26 1218) BP: (127-136)/(82-100) 127/82 mmHg (04/26 1218) SpO2:  [98 %] 98 % (04/26 1218) Weight:  [203 lb 6.4 oz (92.262 kg)] 203 lb 6.4 oz (92.262 kg) (04/26 0536) Last BM Date: 04/11/16  Intake/Output from previous day: 04/25 0701 - 04/26 0700 In: 542 [P.O.:480; IV Piggyback:62] Out: 2400 [Urine:2400] Intake/Output this shift: Total I/O In: -  Out: 525 [Urine:525]  Medications Current Facility-Administered Medications  Medication Dose Route Frequency Provider Last Rate Last Dose  . 0.9 %  sodium chloride infusion  250 mL Intravenous PRN Scott T Weaver, PA-C      . 0.9 %  sodium chloride infusion  250 mL Intravenous Continuous Lars Masson, MD 1 mL/hr at 04/12/16 0900 250 mL at 04/12/16 0900  . acetaminophen (TYLENOL) tablet 650 mg  650 mg Oral Q4H PRN Beatrice Lecher, PA-C   650 mg at 04/10/16 0758  . atorvastatin (LIPITOR) tablet 20 mg  20 mg Oral Daily Beatrice Lecher, PA-C   20 mg at 04/14/16 1610  . carvedilol (COREG) tablet 6.25 mg  6.25 mg Oral BID WC Vesta Mixer, MD   6.25 mg at 04/14/16 0536  . escitalopram (LEXAPRO) tablet 5 mg  5 mg Oral Daily Beatrice Lecher, PA-C   5 mg at 04/14/16 0929  . feeding supplement (ENSURE ENLIVE) (ENSURE ENLIVE) liquid 237 mL  237 mL Oral BID BM Vesta Mixer, MD   237 mL at 04/14/16 1000  . ferrous sulfate tablet 325 mg  325 mg Oral Daily Beatrice Lecher, PA-C   325 mg at  04/14/16 9604  . furosemide (LASIX) 120 mg in dextrose 5 % 50 mL IVPB  120 mg Intravenous BID Chilton Si, MD   120 mg at 04/14/16 0745  . hydrALAZINE (APRESOLINE) tablet 25 mg  25 mg Oral TID Beatrice Lecher, PA-C   25 mg at 04/14/16 5409  . HYDROcodone-acetaminophen (NORCO/VICODIN) 5-325 MG per tablet 1 tablet  1 tablet Oral Q8H PRN Beatrice Lecher, PA-C      . isosorbide mononitrate (IMDUR) 24 hr tablet 30 mg  30 mg Oral Daily Beatrice Lecher, PA-C   30 mg at 04/14/16 8119  . levothyroxine (SYNTHROID, LEVOTHROID) tablet 175 mcg  175 mcg Oral QAC breakfast Beatrice Lecher, PA-C   175 mcg at 04/14/16 0536  . mometasone-formoterol (DULERA) 200-5 MCG/ACT inhaler 2 puff  2 puff Inhalation BID Beatrice Lecher, PA-C   2 puff at 04/14/16 0941  . ondansetron (ZOFRAN) injection 4 mg  4 mg Intravenous Q6H PRN Beatrice Lecher, PA-C   4 mg at 04/08/16 0133  . potassium chloride SA (K-DUR,KLOR-CON) CR tablet 40 mEq  40 mEq Oral BID Beatrice Lecher, PA-C   40 mEq at 04/14/16 1478  . sodium chloride flush (NS) 0.9 % injection 3 mL  3 mL Intravenous Q12H Beatrice Lecher, PA-C   3 mL at 04/14/16 1000  . sodium  chloride flush (NS) 0.9 % injection 3 mL  3 mL Intravenous PRN Beatrice Lecher, PA-C      . sodium chloride flush (NS) 0.9 % injection 3 mL  3 mL Intravenous Q12H Lars Masson, MD   3 mL at 04/12/16 1000  . sodium chloride flush (NS) 0.9 % injection 3 mL  3 mL Intravenous PRN Lars Masson, MD      . tamsulosin [facility]) capsule 0.4 mg  0.4 mg Oral Daily Beatrice Lecher, PA-C   0.4 mg at 04/14/16 7062  . triamcinolone cream (KENALOG) 0.5 %   Topical BID Beatrice Lecher, PA-C      . warfarin (COUMADIN) tablet 2.5 mg  2.5 mg Oral ONCE-1800 Lauren D Bajbus, RPH      . Warfarin - Pharmacist Dosing Inpatient   Does not apply q1800 Herby Abraham, [facility]        PE: General appearance: alert, cooperative and no distress Neck: no carotid bruit and elevated JVD Lungs: clear to auscultation bilaterally Heart:  regular rate and rhythm, S1, S2 normal, no murmur, click, rub or gallop Extremities: 2+ LEE pitting pedal edema on the right and 1+ pitting pedal edema on the left Pulses: 2+ and symmetric Skin: warm and dry Neurologic: Grossly normal  Lab Results:   Recent Labs  04/13/16 0709  WBC 6.9  HGB 9.7*  HCT 32.6*  PLT 180   BMET  Recent Labs  04/12/16 0224 04/13/16 0709  NA 147* 145  K 4.2 3.9  CL 109 107  CO2 25 25  GLUCOSE 105* 135*  BUN 34* 34*  CREATININE 2.09* 2.18*  CALCIUM 8.9 9.1   PT/INR  Recent Labs  04/12/16 0224 04/13/16 0709 04/14/16 0244  LABPROT 29.1* 30.2* 29.8*  INR 2.81* 2.95* 2.90*   Filed Weights   04/12/16 0732 04/13/16 0635 04/14/16 0536  Weight: 216 lb (97.977 kg) 209 lb 4.8 oz (94.938 kg) 203 lb 6.4 oz (92.262 kg)    Assessment/Plan  Principal Problem:   Acute on chronic systolic and diastolic heart failure, NYHA class 4 (HCC) Active Problems:   DM (diabetes mellitus), type 2 with renal complications (HCC)   HLD (hyperlipidemia)   CKD (chronic kidney disease) stage 3, GFR 30-59 ml/min   Non-occlusive coronary artery disease   Anticoagulated on Coumadin   NICM (nonischemic cardiomyopathy) (HCC)   Hypernatremia   Anemia in chronic renal disease   Chronic atrial fibrillation (HCC)   ICD (implantable cardioverter-defibrillator), biventricular, in situ   Dementia   Hypertensive cardiomyopathy (HCC)   1. Acute on Chronic Systolic and Diastolic HF: Echo shows EF reduced to 20-25%, from 30-35% in 02/2015. Frank Mclean is still volume overloaded after receiving several liters of fluid for hypernatremia. Better diuresis yesterday with an additional 2.4 L out. Net negative 7.5L total since admit. Weight is down an additional 6 lb since yesterday. Breathing improved. No resting dyspnea but still with mild DOE. LEE also improved. No pretibial edema, just pedal, R>L but much better compared to yesterday. We will transition from IV lasix to PO Torsemide.  His home dose prior to admit was 10 mg. We will increase PO dose to 40 mg and will see how he responds. F/u BMP in the am to assess renal function and K. Suspect possible d/c in the next 24 hrs.  Continue BB, nitrate + hydralazine, low sodium diet, strict I/Os and daily weights.  Given severe systolic HF and limited medical options given CKD, he will need  referral to [facility] for OP f/u.  2. PAF/ Flutter: was in atrial flutter on admit. S/p DCCV 04/12/16 with conversion back to NSR. Telemetry shows paced rhythm with p waves. HR is well controlled. Continue BB for rate control. BB changed from lopressor to carvediolol given significant LV systolic dysfunction. HR remains stable after change. Continue Coumadin for a/c.   3. NICM: EF 20-25%. Nonobstructive CAD by cath 03/2011.  BB changed yesterday from metoprolol to carvedilol and BP is stable. He is not a candidate for ACE/ARB nor spiro or digoxin given baseline renal function. Continue hydralazine + nitrate for afterload reduction.  We will refer to the [facility] for outpatient f/u.   4. BiV Pacer: followed by Dr. Ladona Ridgel  5. CKD: SCr at 2.18 which appears to be his baseline. On lasix for HF but no other nephrotoxic agents. Plan to convert to oral torsemide, 40 mg today which is an increased dose from his home dose of 10 mg. Will order f/u BMP for tomorrow to reassess renal function and K.   6. HLD: controlled with statin therapy. Recent LP 03/19/16 showed satisfactory levels: LDL 52. TG 101. HDL however was low at 25. Continue Lipitor 20 mg nightly.   7. H/o CVA: also with h/o afib/flutter. He is on chronic anticoagulation with Coumadin. INR is therapeutic.   8. Chronic Anticoagulation: Coumadin for h/o afib/flutter and CVA. INR is therapeutic at 2.9. Goal is 2-3.      LOS: 8 days    Frank Mclean 04/14/2016 1:31 PM   I have seen, examined and evaluated the patient this PM along with Ms. Sharol Harness, PA-C.  After  reviewing all the available data and chart,  I agree with her findings, examination as well as impression recommendations.  Overall, he seems to be improving - less pre-tibial edema, but still R>L pedal edema.   Seems to be maintaining NSR, but difficult to tel on Tele with paced beats.  PLAN: convert to PO diuretic - Torsemide  daily (1st dose now) Order TED hose -- elevated feet.  If UOP stable, anticipate d/c tomorrow. Will need HH PT set up per PT c/s  Will set up TCM in CHF clinic.    Marykay Lex, M.D., M.S. Interventional Cardiologist   Pager # [phone] Phone # [phone] [address] [place], [place] [address]

## 2016-04-14 NOTE — Consult Note (Signed)
   [facility] CM Inpatient Consult   04/14/2016  Frank Mclean 1941-08-03 [id number]   Referral received for post hospital follow up.  Patient evaluated for community based chronic disease management services with [facility] Care Management Program as a benefit of patient's [employer]. Admitted 2 times in the last month with HF exacerbations from a recent stay at [facility].  Spoke with patient at bedside to explain [facility] Care Management services.  Patient asked this writer, "Can you please sit down with me for a minute.  I have seen my wife in a few days and I am not sure about if I can go back home."  Explained that this information will be best to share with the inpatient care management staff. Patient agreed.   Patient requested his daughter's phone number listed in the chart encounter demographics for Andree Coss as [phone]. Patient states he has a son at home named Judie Petit and requested to call his home number to speak with him.  Called placed to [phone] but the automated voicemail states that calls are not being received at this time.  Consent signed for post hospital services.   Patient will receive post hospital discharge call and will be evaluated for monthly home visits for assessments and disease process education.  Left contact information and [facility] literature at bedside. Made Inpatient Case Manager aware that [facility] Care Management following. Notified inpatient RNCM regarding family issues for potential post hospital barriers. Please notify [facility] of any address changes for this patient.    Of note, [facility] Care Management services does not replace or interfere with any services that are arranged by inpatient case management or social work.  For additional questions or referrals please contact:    Charlesetta Shanks, RN BSN CCM [facility]  [phone] business mobile phone Toll free office [phone]

## 2016-04-14 NOTE — Progress Notes (Signed)
Patient is active with [facility] for HHRN/ PT as prior to admission; patient is calling family members in [place] to see where he can stay if he does not work out the problems with his spouse at discharge. Patient stated that he will stay with his step father or his brother. CM will follow up; Alexis Goodell [phone]

## 2016-04-15 DIAGNOSIS — L89151 Pressure ulcer of sacral region, stage 1: Secondary | ICD-10-CM

## 2016-04-15 LAB — CBC
HCT: 32.7 % — ABNORMAL LOW (ref 39.0–52.0)
Hemoglobin: 9.7 g/dL — ABNORMAL LOW (ref 13.0–17.0)
MCH: 24.1 pg — AB (ref 26.0–34.0)
MCHC: 29.7 g/dL — ABNORMAL LOW (ref 30.0–36.0)
MCV: 81.1 fL (ref 78.0–100.0)
Platelets: 180 10*3/uL (ref 150–400)
RBC: 4.03 MIL/uL — AB (ref 4.22–5.81)
RDW: 21 % — AB (ref 11.5–15.5)
WBC: 6.6 10*3/uL (ref 4.0–10.5)

## 2016-04-15 LAB — BASIC METABOLIC PANEL WITH GFR
Anion gap: 11 (ref 5–15)
BUN: 30 mg/dL — ABNORMAL HIGH (ref 6–20)
CO2: 30 mmol/L (ref 22–32)
Calcium: 8.9 mg/dL (ref 8.9–10.3)
Chloride: 111 mmol/L (ref 101–111)
Creatinine, Ser: 1.92 mg/dL — ABNORMAL HIGH (ref 0.61–1.24)
GFR calc Af Amer: 37 mL/min — ABNORMAL LOW
GFR calc non Af Amer: 32 mL/min — ABNORMAL LOW
Glucose, Bld: 124 mg/dL — ABNORMAL HIGH (ref 65–99)
Potassium: 3.3 mmol/L — ABNORMAL LOW (ref 3.5–5.1)
Sodium: 152 mmol/L — ABNORMAL HIGH (ref 135–145)

## 2016-04-15 LAB — PROTIME-INR
INR: 2.91 — ABNORMAL HIGH (ref 0.00–1.49)
PROTHROMBIN TIME: 29.9 s — AB (ref 11.6–15.2)

## 2016-04-15 MED ORDER — WARFARIN SODIUM 2 MG PO TABS: 4.0000 mg | ORAL_TABLET | Freq: Once | ORAL | Status: DC

## 2016-04-15 MED ORDER — TORSEMIDE 20 MG PO TABS: 40.0000 mg | ORAL_TABLET | Freq: Every day | ORAL | Status: DC

## 2016-04-15 NOTE — Care Management Note (Signed)
Case Management Note  Patient Details  Name: Frank Mclean MRN: [id number] Date of Birth: 08/18/1941  Action/Plan: Patient is for possible discharge home today with [facility] services provided by [facility]; Was advised during Length of Stay Meeting by Med Director Dr Geoffry Paradise to ask the pt/ spouse to see if they would like to change HHc agencies to [facility] due to high risk for readmission. TCT spouse - she does not want to change HHC agencies, she stated that she knew the nurses and liked them. CM also informed spouse of patient being worried that he could not return home because he thought (his spouse) was mad at him. Spouse stated that they do not have any problems, she plans to continue to care for him at home. Orders to resume [facility] services faxed to [facility] - fax # [phone].  Expected Discharge Date:   possibly 04/15/2016               Expected Discharge Plan:  Home w Home Health Services  In-House Referral:  NA  Discharge planning Services  CM Consult  Post Acute Care Choice:  Home Health Choice offered to:  Patient  DME Arranged:  N/A DME Agency:  NA  HH Arranged:  RN, PT HH Agency:  [facility]  Status of Service:  In process, will continue to follow  Medicare Important Message Given:  Yes     If discussed at Long Length of Stay Meetings, dates discussed:  04/15/2016  Additional Comments:  Cherrie Distance, RN,MHA,BSN [phone] 04/15/2016, 10:09 AM

## 2016-04-15 NOTE — Progress Notes (Signed)
Wife at bedside asking about pt d/c. Paged PA. Will continue to monitor.

## 2016-04-15 NOTE — Progress Notes (Signed)
Physical Therapy Treatment Patient Details Name: Frank Mclean MRN: [id number] DOB: 04/11/1941 Today's Date: 04/15/2016    History of Present Illness Frank Mclean is a 38M with hyperlipidemia, s/p BiV ICD, paroxysmal atrial fibrillation, non-obstructive CAD, hypertension, hyperlipidemia, diabetes, prior stroke, LBBB, dementia and CKD stage 3 here with acute on chronic systolic and diastolic heart failure.    PT Comments    Flexed posture but much improved today w/ min verbal cues.  SpO2 briefly down to 88% but otherwise remains at 91% on 2L O2 w/ poor demonstration of pursed lip breathing.  He will have 24/7 assist/supervision available from his brother in law and wife at d/c.  Pt will benefit from continued skilled PT services to increase functional independence and safety.   Follow Up Recommendations  Home health PT;Supervision/Assistance - 24 hour     Equipment Recommendations  3in1 (PT)    Recommendations for Other Services OT consult     Precautions / Restrictions Precautions Precautions: Fall Restrictions Weight Bearing Restrictions: No    Mobility  Bed Mobility Overal bed mobility: Needs Assistance Bed Mobility: Supine to Sit           General bed mobility comments: HOB elevated and pt uses bed rails w/ increased time.  No physical assist or cues needed.  Transfers Overall transfer level: Needs assistance Equipment used: Rolling walker (2 wheeled) Transfers: Sit to/from Stand Sit to Stand: Min guard         General transfer comment: Pt uses momentum by rocking to stand.  He stands slowly w/ increased effort.  Cues to back up to chair prior to sitting.  Ambulation/Gait Ambulation/Gait assistance: Min guard Ambulation Distance (Feet): 100 Feet Assistive device: Rolling walker (2 wheeled) Gait Pattern/deviations: Trunk flexed;Antalgic;Decreased stride length   Gait velocity interpretation: Below normal speed for age/gender General Gait Details: Flexed posture  but much improved today w/ min verbal cues for upright posture.  SpO2 briefly down to 88% but otherwise remains at 91% on 2L O2 w/ poor demonstration of pursed lip breathing.   Stairs            Wheelchair Mobility    Modified Rankin (Stroke Patients Only)       Balance Overall balance assessment: Needs assistance Sitting-balance support: Feet supported;No upper extremity supported Sitting balance-Leahy Scale: Good     Standing balance support: Bilateral upper extremity supported;During functional activity Standing balance-Leahy Scale: Poor Standing balance comment: RW for support                    Cognition Arousal/Alertness: Awake/alert Behavior During Therapy: WFL for tasks assessed/performed Overall Cognitive Status: No family/caregiver present to determine baseline cognitive functioning                      Exercises General Exercises - Lower Extremity Ankle Circles/Pumps: AROM;Both;10 reps;Seated Long Arc Quad: AROM;Both;10 reps;Seated Straight Leg Raises: AROM;AAROM;Right;Left;5 reps;Seated;Limitations Straight Leg Raises Limitations: Rt LE AAROM due to edema    General Comments General comments (skin integrity, edema, etc.): Pt now reports (and RN confirms) that he will be returning home at d/c w/ his wife.  Brother in law to provide supervision/assist during the day and wife at night.      Pertinent Vitals/Pain Pain Assessment: No/denies pain    Home Living                      Prior Function  PT Goals (current goals can now be found in the care plan section) Acute Rehab PT Goals Patient Stated Goal: agreeable to amb PT Goal Formulation: With patient Time For Goal Achievement: 04/25/16 Potential to Achieve Goals: Good Progress towards PT goals: Progressing toward goals    Frequency  Min 3X/week    PT Plan Current plan remains appropriate    Co-evaluation             End of Session Equipment  Utilized During Treatment: Gait belt;Oxygen Activity Tolerance: Patient tolerated treatment well Patient left: in chair;with call bell/phone within reach;with chair alarm set     Time: 0939-1000 PT Time Calculation (min) (ACUTE ONLY): 21 min  Charges:  $Gait Training: 8-22 mins                    G Codes:      Encarnacion Chu PT, DPT  Pager: [phone] Phone: [phone] 04/15/2016, 10:38 AM

## 2016-04-15 NOTE — Progress Notes (Signed)
ANTICOAGULATION CONSULT NOTE - FOLLOW UP  Pharmacy Consult:  warfarin Indication: atrial fibrillation  Allergies  Allergen Reactions  . Levothyroxine Sodium Other (See Comments)    MUST TAKE BRAND NAME GENERIC DOESN'T WORK FOR PATIENT   Patient Measurements: Height: 6\' 2"  (188 cm) Weight: 215 lb 14.1 oz (97.922 kg) (Scale A) IBW/kg (Calculated) : 82.2  Vital Signs: Temp: 97.3 F (36.3 C) (04/27 0404) Temp Source: Oral (04/27 0404) BP: 141/97 mmHg (04/27 0932) Pulse Rate: 80 (04/27 0404)  Labs:  Recent Labs  04/13/16 0709 04/14/16 0244 04/15/16 0312  HGB 9.7*  --  9.7*  HCT 32.6*  --  32.7*  PLT 180  --  180  LABPROT 30.2* 29.8* 29.9*  INR 2.95* 2.90* 2.91*  CREATININE 2.18*  --  1.92*    Estimated Creatinine Clearance: 38.1 mL/min (by C-G formula based on Cr of 1.92).  Assessment: 62 YOM with chronic systolic CHF admitted with complaint of leg swelling.  Patient continues on Coumadin from PTA for history of Afib.  Home dose: 5mg  daily except 2.5mg  on MWF.  INR therapeutic at 2.91 today. PO intake is down for several days now, will give a slightly reduced dose from home regimen. Hgb 9.7, plts 180- no bleeding noted.  Goal of Therapy:  INR 2-3   Plan:  - Coumadin 4mg  PO today - Monitor daily INR, CBC, clinical course, s/sx of bleed, PO intake, DDI    Thank you for allowing [place] to participate in this patients care. Signe Colt, PharmD Pager: [phone]  04/15/2016 10:15 AM

## 2016-04-15 NOTE — Progress Notes (Signed)
Subjective: He feels well.  No SOB.   Objective: Vital signs in last 24 hours: Temp:  [97.3 F (36.3 C)-97.9 F (36.6 C)] 97.3 F (36.3 C) (04/27 0404) Pulse Rate:  [80-86] 80 (04/27 0404) Resp:  [16-20] 16 (04/27 0404) BP: (120-141)/(62-97) 141/97 mmHg (04/27 0932) SpO2:  [88 %-100 %] 89 % (04/27 0816) Weight:  [215 lb 14.1 oz (97.922 kg)] 215 lb 14.1 oz (97.922 kg) (04/27 0404) Last BM Date: 04/13/16  Intake/Output from previous day: 04/26 0701 - 04/27 0700 In: 243 [P.O.:240; I.V.:3] Out: 2627 [Urine:2625; Stool:2] Intake/Output this shift: Total I/O In: 60 [P.O.:60] Out: 600 [Urine:600]  Medications Scheduled Meds: . atorvastatin  20 mg Oral Daily  . carvedilol  6.25 mg Oral BID WC  . escitalopram  5 mg Oral Daily  . feeding supplement (ENSURE ENLIVE)  237 mL Oral BID BM  . ferrous sulfate  325 mg Oral Daily  . hydrALAZINE  25 mg Oral TID  . isosorbide mononitrate  30 mg Oral Daily  . levothyroxine  175 mcg Oral QAC breakfast  . mometasone-formoterol  2 puff Inhalation BID  . potassium chloride  40 mEq Oral BID  . sodium chloride flush  3 mL Intravenous Q12H  . sodium chloride flush  3 mL Intravenous Q12H  . tamsulosin  0.4 mg Oral Daily  . torsemide  40 mg Oral Daily  . triamcinolone cream   Topical BID  . warfarin  4 mg Oral ONCE-1800  . Warfarin - Pharmacist Dosing Inpatient   Does not apply q1800   Continuous Infusions: . sodium chloride 250 mL (04/12/16 0900)   PRN Meds:.sodium chloride, acetaminophen, HYDROcodone-acetaminophen, ondansetron (ZOFRAN) IV, sodium chloride flush, sodium chloride flush  PE: General appearance: alert, cooperative and no distress Lungs: clear to auscultation bilaterally Heart: regular rate and rhythm, S1, S2 normal, no murmur, click, rub or gallop Extremities: 1+ right LEE.  None on the left.  Pulses: 2+ and symmetric Skin: Warm and dry.  Cleft of buttocks: mild skin irritation/erythema Neurologic: Grossly normal, Alert  and orient x 4  Lab Results:   Recent Labs  04/13/16 0709 04/15/16 0312  WBC 6.9 6.6  HGB 9.7* 9.7*  HCT 32.6* 32.7*  PLT 180 180   BMET  Recent Labs  04/13/16 0709 04/15/16 0312  NA 145 152*  K 3.9 3.3*  CL 107 111  CO2 25 30  GLUCOSE 135* 124*  BUN 34* 30*  CREATININE 2.18* 1.92*  CALCIUM 9.1 8.9   PT/INR  Recent Labs  04/13/16 0709 04/14/16 0244 04/15/16 0312  LABPROT 30.2* 29.8* 29.9*  INR 2.95* 2.90* 2.91*     Assessment/Plan Frank Mclean is a 11M with hyperlipidemia, s/p BiV ICD, paroxysmal atrial fibrillation, non-obstructive CAD, hypertension, hyperlipidemia, diabetes, prior stroke, LBBB, dementia and CKD stage 3 here with acute on chronic systolic and diastolic heart failure.   1. Acute on Chronic Systolic and Diastolic HF: Echo shows EF reduced to 20-25%, from 30-35% in 02/2015. Net fluids: -2.4L/-9.5L . Now on PO Torsemide. His home dose prior to admit was 10 mg. We increased dose to 40 mg. SCr improve. mildly hypokalemic- replace today.  Continue BB, nitrate + hydralazine, low sodium diet, strict I/Os and daily weights. Given severe systolic HF and limited medical options given CKD, he will need [facility]/u.  Appt arranged. TED hose to right LE.   2. PAF/ Flutter: was in atrial flutter on admit. S/p DCCV 04/12/16 with conversion  back to NSR. Telemetry shows paced rhythm with p waves. HR is well controlled. Continue BB for rate control. BB changed from lopressor to carvediolol given significant LV systolic dysfunction. HR remains stable after change. Continue Coumadin for a/c.   3. NICM: EF 20-25%. Nonobstructive CAD by cath 03/2011. BB changed yesterday from metoprolol to carvedilol and BP is stable. He is not a candidate for ACE/ARB nor spiro or digoxin given baseline renal function. Continue hydralazine + nitrate for afterload reduction.   4. BiV Pacer: followed by Dr. Ladona Ridgel  5. CKD: SCr improved to 1.92 which appears to  be near his baseline.    6. HLD: controlled with statin therapy. Recent LP 03/19/16 showed satisfactory levels: LDL 52. TG 101. HDL however was low at 25. Continue Lipitor 20 mg nightly.   7. H/o CVA: also with h/o afib/flutter. He is on chronic anticoagulation with Coumadin. INR is therapeutic.   8. Chronic Anticoagulation: Coumadin for h/o afib/flutter and CVA. INR is therapeutic at 2.9. Goal is 2-3.   Mr. Oyster ambulated down the hall 134ft with a rolling walker with PT.  O2 82% while walking on RA.  He has O2 at home.  He is ready for DC home today.  HH/PT.  He has mild skin irritation in the cleft of his buttocks.  Recommended Desitin cream.  Continue protective dressing.  Adjust sitting position regularly.  Can use inflatable donut cushion.       LOS: 9 days    HAGER, BRYAN PA-C 04/15/2016 11:24 AM   I have seen, examined and evaluated the patient this PM along with Mr. Leron Croak, [place]. After reviewing all the available data and chart,  I agree with his findings, examination as well as impression recommendations.  He actually looks much better today. Continue diuresis on oral torsemide. He still has some edema in the right leg, but has improved. Was better with compression stockings on board. Recommend continued compression stockings. Small decubitus lesion that doesn't  quite meet criteria for decubitus ulcer. Agree with the dressing recommendations.  His blood pressures little high today, but this the first time as been so high. We can further adjust medications as an outpatient. Agree with discharge today on current medications as noted above. This essentially my recommendations and plan from yesterday with slight adjustments for today.  Plan: Discharge today. He has close follow-up scheduled in heart failure clinic. I did increase his torsemide from home dose. Would consider possibly consists checking a chemistry panel on the day of follow-up.    Marykay Lex, M.D.,  M.S. Interventional Cardiologist   Pager # [phone] Phone # [phone] [address] [place], [place] [address]

## 2016-04-15 NOTE — Progress Notes (Signed)
CM talked to the spouse and pt again about [facility] ( [facility] to help reduce readmission at the spouse Joy request. Spouse decided to change HHC agencies and go with [facility]. [facility] called to cancel services with the patient and spouse present. Dareen Piano with [facility] called for [facility] services/ referral. Alexis Goodell [phone]

## 2016-04-16 ENCOUNTER — Other Ambulatory Visit: Payer: Self-pay | Admitting: *Deleted

## 2016-04-16 ENCOUNTER — Telehealth: Payer: Self-pay | Admitting: *Deleted

## 2016-04-16 ENCOUNTER — Telehealth: Payer: Self-pay

## 2016-04-16 DIAGNOSIS — J441 Chronic obstructive pulmonary disease with (acute) exacerbation: Secondary | ICD-10-CM

## 2016-04-16 DIAGNOSIS — I130 Hypertensive heart and chronic kidney disease with heart failure and stage 1 through stage 4 chronic kidney disease, or unspecified chronic kidney disease: Secondary | ICD-10-CM | POA: Diagnosis not present

## 2016-04-16 DIAGNOSIS — I5041 Acute combined systolic (congestive) and diastolic (congestive) heart failure: Secondary | ICD-10-CM

## 2016-04-16 DIAGNOSIS — I5022 Chronic systolic (congestive) heart failure: Secondary | ICD-10-CM | POA: Diagnosis not present

## 2016-04-16 DIAGNOSIS — E1369 Other specified diabetes mellitus with other specified complication: Secondary | ICD-10-CM

## 2016-04-16 NOTE — Patient Outreach (Signed)
Referral received from hospital liaison to begin transition of care program once discharged.  He was admitted on 4/18 for complications of congestive heart failure, discharged on 4/27.  According to chart, he also has a history of coronary artery disease, cardiomyopathy, atrial fibrillation, COPD, diabetes, chronic kidney disease, and hypothyroidism.  Call placed to preferred number, [phone], wife, [name], answers phone and verifies identity.  Purpose of call provided, she states that they already have [facility] coming to the home (they have already contacted member/wife and have scheduled their initial visit).  Difference between [facility] and [facility] explained, she verbalizes understanding.  She report that he is "ok" today, stating that her daughter is home with him while she is at work.  She state that she used to be able to leave the member at home alone while working, but state that she is unable to do so now.  She state that she does not have anyone consistent that is able to help.  She is made aware that social worker referral will be placed to discuss options such as personal care assistance.  Wife state that she has been helping the member with all of his medications, and daily weights.  Heart failure zones discussed, she is aware of zones and action plan, stating "we both have bad hearts, both have ICDs."  She state that the member has a follow up appointment at the heart failure clinic on next week.  She denies any urgent concerns, other than assistance in the home.  She agrees to weekly transition of care calls, also agrees to initial home visit within the next 2 weeks.  Contact information provided, encouraged to contact with questions.  Kemper Durie, BSN, [facility] Care Management  [facility] Care Manager [phone]

## 2016-04-16 NOTE — Telephone Encounter (Signed)
INFORMATIONAL ONLY  RN from [facility] (was not able to understand her name on the voicemail) called and wanted to let PCP know that [facility] was ordered and that they will be out to evaluate patient for PT. Currently, skilled nursing and OT 2 x week for 2 weeks.  Number to contact Frances Furbish is [phone]

## 2016-04-16 NOTE — Telephone Encounter (Signed)
Pt was on TCM list admitted for chronic systolic& diastolic HF. Pt was D/C 04/15/16 will be f/u w/cardiologist Dr. Freida Busman 04/22/16...Frank Mclean

## 2016-04-17 DIAGNOSIS — I5022 Chronic systolic (congestive) heart failure: Secondary | ICD-10-CM | POA: Diagnosis not present

## 2016-04-17 DIAGNOSIS — I130 Hypertensive heart and chronic kidney disease with heart failure and stage 1 through stage 4 chronic kidney disease, or unspecified chronic kidney disease: Secondary | ICD-10-CM | POA: Diagnosis not present

## 2016-04-18 NOTE — Telephone Encounter (Signed)
Ok Thx 

## 2016-04-19 ENCOUNTER — Telehealth: Payer: Self-pay

## 2016-04-19 ENCOUNTER — Ambulatory Visit (INDEPENDENT_AMBULATORY_CARE_PROVIDER_SITE_OTHER): Payer: Medicare Other | Admitting: Internal Medicine

## 2016-04-19 DIAGNOSIS — I130 Hypertensive heart and chronic kidney disease with heart failure and stage 1 through stage 4 chronic kidney disease, or unspecified chronic kidney disease: Secondary | ICD-10-CM | POA: Diagnosis not present

## 2016-04-19 DIAGNOSIS — Z5181 Encounter for therapeutic drug level monitoring: Secondary | ICD-10-CM

## 2016-04-19 DIAGNOSIS — I6350 Cerebral infarction due to unspecified occlusion or stenosis of unspecified cerebral artery: Secondary | ICD-10-CM

## 2016-04-19 DIAGNOSIS — I5022 Chronic systolic (congestive) heart failure: Secondary | ICD-10-CM | POA: Diagnosis not present

## 2016-04-19 LAB — POCT INR: INR: 3.2

## 2016-04-19 NOTE — Telephone Encounter (Signed)
Notified arnold w/MD response...Raechel Chute

## 2016-04-19 NOTE — Telephone Encounter (Signed)
Please advise is this ok?

## 2016-04-19 NOTE — Telephone Encounter (Signed)
Ok Thx 

## 2016-04-19 NOTE — Telephone Encounter (Signed)
Needs orders for PT for home health. Orders: Twice a week for 3 weeks omce a week for one week

## 2016-04-20 DIAGNOSIS — I5022 Chronic systolic (congestive) heart failure: Secondary | ICD-10-CM | POA: Diagnosis not present

## 2016-04-20 DIAGNOSIS — I130 Hypertensive heart and chronic kidney disease with heart failure and stage 1 through stage 4 chronic kidney disease, or unspecified chronic kidney disease: Secondary | ICD-10-CM | POA: Diagnosis not present

## 2016-04-21 ENCOUNTER — Encounter: Payer: Self-pay | Admitting: *Deleted

## 2016-04-22 ENCOUNTER — Other Ambulatory Visit (INDEPENDENT_AMBULATORY_CARE_PROVIDER_SITE_OTHER): Payer: Medicare Other

## 2016-04-22 ENCOUNTER — Encounter: Payer: Self-pay | Admitting: Internal Medicine

## 2016-04-22 ENCOUNTER — Ambulatory Visit (HOSPITAL_COMMUNITY)
Admit: 2016-04-22 | Discharge: 2016-04-22 | Disposition: A | Discharge status: Home / Self Care | Payer: Medicare Other | Source: Ambulatory Visit | Attending: Cardiology | Admitting: Cardiology

## 2016-04-22 ENCOUNTER — Encounter: Payer: Self-pay | Admitting: *Deleted

## 2016-04-22 ENCOUNTER — Encounter (HOSPITAL_COMMUNITY): Payer: Self-pay

## 2016-04-22 ENCOUNTER — Ambulatory Visit (INDEPENDENT_AMBULATORY_CARE_PROVIDER_SITE_OTHER): Payer: Medicare Other | Admitting: Internal Medicine

## 2016-04-22 VITALS — BP 116/84 | HR 73 | Wt 201.8 lb

## 2016-04-22 VITALS — BP 118/78 | HR 67 | Wt 201.0 lb

## 2016-04-22 DIAGNOSIS — D649 Anemia, unspecified: Secondary | ICD-10-CM | POA: Insufficient documentation

## 2016-04-22 DIAGNOSIS — I2510 Atherosclerotic heart disease of native coronary artery without angina pectoris: Secondary | ICD-10-CM | POA: Diagnosis not present

## 2016-04-22 DIAGNOSIS — F03918 Unspecified dementia, unspecified severity, with other behavioral disturbance: Secondary | ICD-10-CM

## 2016-04-22 DIAGNOSIS — I130 Hypertensive heart and chronic kidney disease with heart failure and stage 1 through stage 4 chronic kidney disease, or unspecified chronic kidney disease: Secondary | ICD-10-CM | POA: Diagnosis not present

## 2016-04-22 DIAGNOSIS — I739 Peripheral vascular disease, unspecified: Secondary | ICD-10-CM | POA: Diagnosis not present

## 2016-04-22 DIAGNOSIS — I5022 Chronic systolic (congestive) heart failure: Secondary | ICD-10-CM

## 2016-04-22 DIAGNOSIS — I480 Paroxysmal atrial fibrillation: Secondary | ICD-10-CM | POA: Diagnosis not present

## 2016-04-22 DIAGNOSIS — E1122 Type 2 diabetes mellitus with diabetic chronic kidney disease: Secondary | ICD-10-CM

## 2016-04-22 DIAGNOSIS — N184 Chronic kidney disease, stage 4 (severe): Secondary | ICD-10-CM

## 2016-04-22 DIAGNOSIS — Z95810 Presence of automatic (implantable) cardiac defibrillator: Secondary | ICD-10-CM | POA: Diagnosis not present

## 2016-04-22 DIAGNOSIS — E538 Deficiency of other specified B group vitamins: Secondary | ICD-10-CM | POA: Diagnosis not present

## 2016-04-22 DIAGNOSIS — I428 Other cardiomyopathies: Secondary | ICD-10-CM | POA: Insufficient documentation

## 2016-04-22 DIAGNOSIS — E785 Hyperlipidemia, unspecified: Secondary | ICD-10-CM | POA: Diagnosis not present

## 2016-04-22 DIAGNOSIS — N183 Chronic kidney disease, stage 3 (moderate): Secondary | ICD-10-CM | POA: Diagnosis not present

## 2016-04-22 DIAGNOSIS — F0390 Unspecified dementia without behavioral disturbance: Secondary | ICD-10-CM | POA: Diagnosis not present

## 2016-04-22 DIAGNOSIS — Z7901 Long term (current) use of anticoagulants: Secondary | ICD-10-CM | POA: Insufficient documentation

## 2016-04-22 DIAGNOSIS — F0391 Unspecified dementia with behavioral disturbance: Secondary | ICD-10-CM | POA: Diagnosis not present

## 2016-04-22 DIAGNOSIS — E039 Hypothyroidism, unspecified: Secondary | ICD-10-CM | POA: Diagnosis not present

## 2016-04-22 DIAGNOSIS — E1142 Type 2 diabetes mellitus with diabetic polyneuropathy: Secondary | ICD-10-CM | POA: Diagnosis not present

## 2016-04-22 DIAGNOSIS — M109 Gout, unspecified: Secondary | ICD-10-CM | POA: Insufficient documentation

## 2016-04-22 DIAGNOSIS — Z8673 Personal history of transient ischemic attack (TIA), and cerebral infarction without residual deficits: Secondary | ICD-10-CM | POA: Diagnosis not present

## 2016-04-22 DIAGNOSIS — Z79899 Other long term (current) drug therapy: Secondary | ICD-10-CM | POA: Diagnosis not present

## 2016-04-22 LAB — HEPATIC FUNCTION PANEL
ALBUMIN: 3.5 g/dL (ref 3.5–5.2)
ALK PHOS: 131 U/L — AB (ref 39–117)
ALT: 22 U/L (ref 0–53)
AST: 16 U/L (ref 0–37)
BILIRUBIN TOTAL: 1.2 mg/dL (ref 0.2–1.2)
Bilirubin, Direct: 0.3 mg/dL (ref 0.0–0.3)
Total Protein: 5.8 g/dL — ABNORMAL LOW (ref 6.0–8.3)

## 2016-04-22 LAB — BASIC METABOLIC PANEL
BUN: 18 mg/dL (ref 6–23)
CO2: 28 mEq/L (ref 19–32)
Calcium: 9 mg/dL (ref 8.4–10.5)
Chloride: 113 mEq/L — ABNORMAL HIGH (ref 96–112)
Creatinine, Ser: 1.97 mg/dL — ABNORMAL HIGH (ref 0.40–1.50)
GFR: 42.62 mL/min — ABNORMAL LOW (ref >60.00)
Glucose, Bld: 99 mg/dL (ref 70–99)
POTASSIUM: 3.6 meq/L (ref 3.5–5.1)
SODIUM: 150 meq/L — AB (ref 135–145)

## 2016-04-22 LAB — CBC WITH DIFFERENTIAL/PLATELET
BASOS ABS: 0 10*3/uL (ref 0.0–0.1)
Basophils Relative: 0.4 % (ref 0.0–3.0)
Eosinophils Absolute: 0.1 10*3/uL (ref 0.0–0.7)
Eosinophils Relative: 1.2 % (ref 0.0–5.0)
HEMATOCRIT: 33.3 % — AB (ref 39.0–52.0)
HEMOGLOBIN: 10.3 g/dL — AB (ref 13.0–17.0)
LYMPHS PCT: 9.3 % — AB (ref 12.0–46.0)
Lymphs Abs: 0.7 10*3/uL (ref 0.7–4.0)
MCHC: 31 g/dL (ref 30.0–36.0)
MCV: 79.1 fl (ref 78.0–100.0)
MONOS PCT: 5.8 % (ref 3.0–12.0)
Monocytes Absolute: 0.4 10*3/uL (ref 0.1–1.0)
NEUTROS ABS: 6.4 10*3/uL (ref 1.4–7.7)
Neutrophils Relative %: 83.3 % — ABNORMAL HIGH (ref 43.0–77.0)
Platelets: 203 10*3/uL (ref 150.0–400.0)
RBC: 4.21 Mil/uL — ABNORMAL LOW (ref 4.22–5.81)
RDW: 22.7 % — AB (ref 11.5–15.5)
WBC: 7.7 10*3/uL (ref 4.0–10.5)

## 2016-04-22 LAB — VITAMIN B12: VITAMIN B 12: 1053 pg/mL — AB (ref 211–911)

## 2016-04-22 LAB — TSH: TSH: 1.1 u[IU]/mL (ref 0.35–4.50)

## 2016-04-22 LAB — HEMOGLOBIN A1C: Hgb A1c MFr Bld: 6.1 % (ref 4.6–6.5)

## 2016-04-22 MED ORDER — AMIODARONE HCL 200 MG PO TABS: ORAL_TABLET | ORAL | Status: DC

## 2016-04-22 MED ORDER — TORSEMIDE 20 MG PO TABS: 40.0000 mg | ORAL_TABLET | Freq: Every day | ORAL | Status: DC

## 2016-04-22 MED ORDER — MEMANTINE HCL-DONEPEZIL HCL 7 & 14 & 21 &28 -10 MG PO C4PK: 1.0000 | EXTENDED_RELEASE_CAPSULE | Freq: Every day | ORAL | Status: DC

## 2016-04-22 MED ORDER — MEMANTINE HCL-DONEPEZIL HCL ER 28-10 MG PO CP24: 1.0000 | ORAL_CAPSULE | Freq: Every day | ORAL | Status: DC

## 2016-04-22 MED ORDER — POTASSIUM CHLORIDE CRYS ER 20 MEQ PO TBCR: 20.0000 meq | EXTENDED_RELEASE_TABLET | Freq: Two times a day (BID) | ORAL | Status: DC

## 2016-04-22 MED ORDER — SPIRONOLACTONE 25 MG PO TABS: 12.5000 mg | ORAL_TABLET | Freq: Every day | ORAL | Status: DC

## 2016-04-22 NOTE — Progress Notes (Signed)
Pre visit review using our clinic review tool, if applicable. No additional management support is needed unless otherwise documented below in the visit note. 

## 2016-04-22 NOTE — Assessment & Plan Note (Signed)
Labs

## 2016-04-22 NOTE — Assessment & Plan Note (Signed)
Alzheimer's Chronic Moderate Hallucinations at times 5/17 Namzaric starter pack

## 2016-04-22 NOTE — Patient Instructions (Addendum)
START Amiodarone 200 mg (1 tab) twice daily for 1 WEEK. Then reduce dose to 200 mg (1 tab) once daily.  START Spironolactone 12.5 mg (1/2 tab) once daily.  INCREASE Torsemide to 40 mg (2 tabs) twice daily for 4 DAYS. Then reduce dose to 40 mg ( 2 tabs) in am and 20 mg (1 tab) in pm.  INCREASE Potassium to 20 meq (1 tab) twice daily.  Follow up 1 week with Dr. Shirlee Latch.  Drink EXTRA FLUID until seen with Dr. Shirlee Latch next week.  Do the following things EVERYDAY: 1) Weigh yourself in the morning before breakfast. Write it down and keep it in a log. 2) Take your medicines as prescribed 3) Eat low salt foods-Limit salt (sodium) to 2000 mg per day.  4) Stay as active as you can everyday

## 2016-04-22 NOTE — Progress Notes (Signed)
Subjective:  Patient ID: Frank Mclean, male    DOB: 11-10-1941  Age: 75 y.o. MRN: [id number]  CC: No chief complaint on file.   HPI Frank Mclean presents for post-hosp f/u. C/o hallucinating at times. C/o nightmares - they are agitating. F/u CHF, wt loss   "Frank RADI is a 75 y.o. male with a hx of chronic systolic HF felt secondary to hypertension, s/p [employer] ICD placement 02/2015 with failed attempt at LV lead placement (consideration of epicardial lead but he was felt too high risk for this), paroxysmal atrial fibrillation on chronic Coumadin, nonobstructive CAD by cath 03/2011, HTN, DM, stroke/TIA, peripheral neuropathy, HLD, CKD stage III, LBBB, hypothyroidism, PAD. Echo 30-35%, grade 3 DD, mod MR, mod LAE, mod reduced RV function, severely dilated RA, PASP . He continued to have class III symptoms and eventually was referred to Dr. Laneta Simmers in 8/16 for consideration of LV epicardial lead placement. This was undertaken in 9/16 with revision of CRT system. Last seen by Dr. Ladona Ridgel in 12/16 was somewhat improved symptoms. He has also been followed by Dr. Antoine Poche and Dr. Shirlee Latch.  He was admitted 3/14-3/19 with hypernatremia and altered mental status in the setting of dehydration and probable UTI and worsening dementia. Sodium was 155 upon admission and creatinine was 2.1. He was noted to have nonadherence to medications recently due to worsening dementia. He was hydrated with D5W. Sodium improved to 140 at discharge. He was discharged to [facility]. Discharged to home 03/31/16.  On 04/05/16, he presented to [facility] with complaint of increased LEE. His weight was up ~6lb since leaving Frank Mclean. He also complained of DOE with minimal activity. Based on assessment, it was felt that he was in acute on chronic combined systolic + diastolic HF. He was also seen by Dr. Elease Hashimoto in clinic who agreed"  I reviewed hosp records  Outpatient Prescriptions Prior to Visit  Medication Sig  Dispense Refill  . acetaminophen (TYLENOL) 325 MG tablet Take 1 tablet (325 mg total) by mouth every 6 (six) hours as needed for mild pain (or Fever >/= 101).    [name] atorvastatin (LIPITOR) 20 MG tablet Take 1 tablet (20 mg total) by mouth daily. 90 tablet 3  . carvedilol (COREG) 6.25 MG tablet Take 1 tablet (6.25 mg total) by mouth 2 (two) times daily with a meal. 60 tablet 6  . Cholecalciferol (VITAMIN D-3 PO) Take 1,000 Units by mouth daily.    [name] escitalopram (LEXAPRO) 5 MG tablet TAKE 1 TABLET BY MOUTH EVERY DAY 30 tablet 3  . ferrous sulfate 325 (65 FE) MG tablet Take 1 tablet (325 mg total) by mouth daily. 30 tablet 11  . hydrALAZINE (APRESOLINE) 25 MG tablet Take 1 tablet (25 mg total) by mouth 3 (three) times daily.    [name] HYDROcodone-acetaminophen (NORCO/VICODIN) 5-325 MG tablet Take 1 tablet by mouth every 8 (eight) hours as needed for moderate pain. Do not exceed 4gm of Tylenol in 24 hours 90 tablet 0  . isosorbide mononitrate (IMDUR) 30 MG 24 hr tablet Take 1 tablet (30 mg total) by mouth 2 (two) times daily.    . Menthol, Topical Analgesic, (BIOFREEZE) 4 % GEL Apply topically 2 (two) times daily. Apply to left shoulder and left ankle    . OXYGEN Inhale 2.5 L/min into the lungs See admin instructions. USES OXYGEN EVERY BEDTIME USES DURING DAY ONLY AS NEEDED FOR SHORTNESS OF BREATH    . potassium chloride SA (K-DUR,KLOR-CON) 20 MEQ tablet  Take 20 mEq by mouth daily.     . SYMBICORT 160-4.5 MCG/ACT inhaler INHALE 2 PUFFS INTO THE LUNGS 2 TIMES DAILY 10.2 Inhaler 5  . tamsulosin (FLOMAX) 0.4 MG CAPS capsule Take 1 capsule (0.4 mg total) by mouth daily. 30 capsule 1  . torsemide (DEMADEX) 20 MG tablet Take 2 tablets (40 mg total) by mouth daily. 30 tablet 11  . triamcinolone cream (KENALOG) 0.5 % Apply topically 2 (two) times daily. 60 g 2  . vitamin B-12 (CYANOCOBALAMIN) 1000 MCG tablet Take 1,000 mcg by mouth daily.    [name] warfarin (COUMADIN) 5 MG tablet Take 1 tablet by mouth daily or as  directed by coumadin clinic. (Patient taking differently: Take 2.5-5 mg by mouth daily at 6 PM. 2.5 MG on Monday Wednesday  Friday. 5 mg all other days.) 30 tablet 0  . levothyroxine (SYNTHROID, LEVOTHROID) 175 MCG tablet Take 175 mcg by mouth daily before breakfast.     No facility-administered medications prior to visit.    ROS Review of Systems  Constitutional: Positive for fatigue and unexpected weight change. Negative for appetite change.  HENT: Negative for congestion, nosebleeds, sneezing, sore throat and trouble swallowing.   Eyes: Negative for itching and visual disturbance.  Respiratory: Negative for cough.   Cardiovascular: Negative for chest pain, palpitations and leg swelling.  Gastrointestinal: Negative for nausea, diarrhea, blood in stool and abdominal distention.  Genitourinary: Negative for frequency, hematuria and decreased urine volume.  Musculoskeletal: Positive for arthralgias and gait problem. Negative for back pain, joint swelling and neck pain.  Skin: Negative for rash.  Neurological: Positive for weakness. Negative for dizziness, tremors and speech difficulty.  Psychiatric/Behavioral: Positive for sleep disturbance and decreased concentration. Negative for suicidal ideas, dysphoric mood and agitation. The patient is nervous/anxious.     Objective:  BP 118/78 mmHg  Pulse 67  Wt 201 lb (91.173 kg)  SpO2 97%  BP Readings from Last 3 Encounters:  04/22/16 118/78  04/15/16 141/97  04/06/16 92/60    Wt Readings from Last 3 Encounters:  04/22/16 201 lb (91.173 kg)  04/15/16 215 lb 14.1 oz (97.922 kg)  04/06/16 217 lb 9.6 oz (98.703 kg)    Physical Exam  Constitutional: He is oriented to person, place, and time. He appears well-developed. No distress.  NAD  HENT:  Mouth/Throat: Oropharynx is clear and moist.  Eyes: Conjunctivae are normal. Pupils are equal, round, and reactive to light.  Neck: Normal range of motion. No JVD present. No thyromegaly  present.  Cardiovascular: Normal rate, regular rhythm, normal heart sounds and intact distal pulses.  Exam reveals no gallop and no friction rub.   No murmur heard. Pulmonary/Chest: Effort normal and breath sounds normal. No respiratory distress. He has no wheezes. He has no rales. He exhibits no tenderness.  Abdominal: Soft. Bowel sounds are normal. He exhibits no distension and no mass. There is no tenderness. There is no rebound and no guarding.  Musculoskeletal: Normal range of motion. He exhibits edema and tenderness.  Lymphadenopathy:    He has no cervical adenopathy.  Neurological: He is alert and oriented to person, place, and time. He has normal reflexes. No cranial nerve deficit. He exhibits normal muscle tone. He displays a negative Romberg sign. Coordination abnormal. Gait normal.  Skin: Skin is warm and dry. No rash noted.  Psychiatric: His behavior is normal. Thought content normal.  disoriented Recalls 0/3 1+ edema B, wounds are healed Chron ill appearing Ataxic - using a cane   Lab  Results  Component Value Date   WBC 6.6 04/15/2016   HGB 9.7* 04/15/2016   HCT 32.7* 04/15/2016   PLT 180 04/15/2016   GLUCOSE 124* 04/15/2016   CHOL 97 03/19/2016   TRIG 101 03/19/2016   HDL 25* 03/19/2016   LDLDIRECT 177.9 06/17/2010   LDLCALC 52 03/19/2016   ALT 26 04/06/2016   AST 23 04/06/2016   NA 152* 04/15/2016   K 3.3* 04/15/2016   CL 111 04/15/2016   CREATININE 1.92* 04/15/2016   BUN 30* 04/15/2016   CO2 30 04/15/2016   TSH 0.15* 03/15/2016   PSA 0.01* 11/18/2011   INR 3.2 04/19/2016   HGBA1C 5.7* 03/03/2016    Dg Chest 2 View  04/07/2016  CLINICAL DATA:  Shortness of Breath EXAM: CHEST  2 VIEW COMPARISON:  March 02, 2016 FINDINGS: There is no appreciable edema or consolidation. There is generalized cardiomegaly with pulmonary venous hypertension. Pacemaker lead positions are unchanged. No adenopathy. No pneumothorax. No bone lesions. IMPRESSION: Stable pulmonary  vascular congestion. No frank edema or consolidation. Stable and apparently intact pacemaker leads. Electronically Signed   By: Bretta Bang III M.D.   On: 04/07/2016 07:45    Assessment & Plan:   There are no diagnoses linked to this encounter. I have discontinued Mr. Wageman's levothyroxine. I am also having him maintain his OXYGEN, atorvastatin, SYMBICORT, carvedilol, tamsulosin, triamcinolone cream, escitalopram, ferrous sulfate, acetaminophen, isosorbide mononitrate, hydrALAZINE, Cholecalciferol (VITAMIN D-3 PO), Menthol (Topical Analgesic), vitamin B-12, potassium chloride SA, HYDROcodone-acetaminophen, warfarin, torsemide, and SYNTHROID.  Meds ordered this encounter  Medications  . SYNTHROID 100 MCG tablet    Sig: Take 2 tablets by mouth every morning.    Refill:  11     Follow-up: No Follow-up on file.  [name], MD

## 2016-04-22 NOTE — Patient Outreach (Signed)
[facility]) Care Management  04/22/2016  Frank Mclean [birth year] [id number]   Patient triggered RED on EMMI Heart Failure, notification sent to [facility], RN.  Thanks, [name]. [name] [facility] Care Management [facility] CM Assistant Phone: [phone] Fax: [phone]

## 2016-04-22 NOTE — Assessment & Plan Note (Signed)
TSH 

## 2016-04-22 NOTE — Patient Outreach (Signed)
[facility]) Care Management  04/22/2016  Frank Mclean [birth year] [id number]   Notified by care management assistant that member triggered red on EMMI dashboard for heart failure.  Preparing to contact member, noticed that he is currently at his follow up appointments.  He has scheduled today a follow up appointment with his PCP, lab work, and appointment with heart failure clinic.  Will contact tomorrow as scheduled for transition of care.  Kemper Durie, BSN, [facility] Care Management  [facility] Care Manager [phone]

## 2016-04-22 NOTE — Assessment & Plan Note (Signed)
A1c

## 2016-04-23 ENCOUNTER — Other Ambulatory Visit: Payer: Self-pay | Admitting: Cardiology

## 2016-04-23 ENCOUNTER — Other Ambulatory Visit: Payer: Self-pay | Admitting: *Deleted

## 2016-04-23 DIAGNOSIS — I5022 Chronic systolic (congestive) heart failure: Secondary | ICD-10-CM | POA: Diagnosis not present

## 2016-04-23 DIAGNOSIS — I130 Hypertensive heart and chronic kidney disease with heart failure and stage 1 through stage 4 chronic kidney disease, or unspecified chronic kidney disease: Secondary | ICD-10-CM | POA: Diagnosis not present

## 2016-04-23 NOTE — Progress Notes (Signed)
PCP:  [name], MD  Electrophysiologist:  Dr. Lewayne Bunting  HF Cardiology: Dr Shirlee Latch   History of Present Illness: Frank Mclean is a 75 y.o. 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   He was seen in the ER on 06/09/15 with dyspnea and LE edema at which time Hgb was 8.6 (runs 8-10 range), Cr 1.85 (most recently 1.93), BNP 384. CXR had shown small left pleural effusion and COPD. He was noted to have wheezing and rhonchi. He was treated with IV Lasix.    In 9/16, LV epicardial lead was placed.    He was admitted in 3/17 with hypernatremia, TUI, and worsening dementia.  He was seen by Tereso Newcomer in 4/17 and was noted to be in atrial fibrillation with increased weight.  He was admitted and diuresed.  He was cardioverted to NSR.  Last echo in 4/17 showed EF 20-25%.   He is  Now back at home.  He is wearing oxygen at night.  He is short of breath after walking 20-30 feet such as going from his bedroom to the kitchen.  No orthopnea/PND.  He is in NSR today.   Optivol was checked today: fluid index > threshold with low impedance.  He has been in NSR since 4/17. Device was formally interrogated, he is only BiV pacing 80% of the time due to PACs and PVCs. Base rate was increased to see if this would help increase BiV pacing.     Labs (7/15): LDL 186 Labs (2/16): K 4.9, creatinine 2.91 => 2.62, proBNP 432, HCT 34.2 Labs (7/16): creatinine 2.0, K 4.1 Labs (5/17): K 3.6, Na 150, creatinine 1.97, HCT 33.3  PMH: 1. CKD: Followed by Dr Lowell Guitar. 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 He is now off amiodarone.  3. 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 He had cardiac cath in 4/12 with mild nonobstructive disease. Echo (6/14) with EF  25-30%, prominent apical trabeculations, diffuse hypokinesis, mild MR. Lexiscan Cardiolite in 1/16 showed a large, severe primarily fixed inferior and apical perfusion defect with EF 25%, suggestive of prior infarct with mild peri-infarct ischemia.  RHC (2/16) with mean RA 6, PA 62/32 mean 46, PCWP 30, CI 1.81.  Echo (3/16) with EF 30-35%, diffuse hypokinesis, mildly dilated RV with moderately decreased systolic function, moderate MR, PASP 54 mmHg. Medtronic ICD (3/16), unable to place LV lead.  - 9/16 epicardial lead placed.  - Echo (4/17) with EF 20-25%, moderate LV dilation, moderately dilated RV with mildly decreased systolic function, moderate to severe TR.  4. CVA: 2004.  5. HTN 6. Gout 7. Hyperlipidemia 8. BPH 9. H/o LBBB 10. Atrial fibrillation: Paroxysmal. DCCV in 4/17.  11. PAD: Has been followed by [facility] (Dr Darrick Penna). History of foot ulcers, now healed.  12. Anemia: Suspect related to CKD.  13. Dementia  SH: Married, lives in [place], never smoked, no ETOH.   FH: Father lived into his 53s, no significant cardiac disease.   ROS: All systems reviewed and negative except as per HPI.   Current Outpatient Prescriptions  Medication Sig Dispense Refill  . acetaminophen (TYLENOL) 325 MG tablet Take 1 tablet (325 mg total) by mouth every 6 (six) hours as needed for mild pain (or Fever >/= 101).    [name] atorvastatin (LIPITOR) 20 MG tablet Take 1 tablet (  20 mg total) by mouth daily. 90 tablet 3  . carvedilol (COREG) 6.25 MG tablet Take 1 tablet (6.25 mg total) by mouth 2 (two) times daily with a meal. 60 tablet 6  . Cholecalciferol (VITAMIN D-3 PO) Take 1,000 Units by mouth daily.    [name] escitalopram (LEXAPRO) 5 MG tablet TAKE 1 TABLET BY MOUTH EVERY DAY 30 tablet 3  . ferrous sulfate 325 (65 FE) MG tablet Take 1 tablet (325 mg total) by mouth daily. 30 tablet 11  . hydrALAZINE (APRESOLINE) 25 MG tablet Take 1 tablet (25 mg total) by mouth 3 (three) times daily.    . isosorbide mononitrate  (IMDUR) 30 MG 24 hr tablet Take 1 tablet (30 mg total) by mouth 2 (two) times daily.    . Memantine HCl-Donepezil HCl (NAMZARIC) 28-10 MG CP24 Take 1 tablet by mouth at bedtime. 30 capsule 11  . Memantine HCl-Donepezil HCl (NAMZARIC) 7 & 14 & 21 &28 -10 MG C4PK Take 1 tablet by mouth at bedtime. 30 each 0  . Menthol, Topical Analgesic, (BIOFREEZE) 4 % GEL Apply topically 2 (two) times daily. Apply to left shoulder and left ankle    . OXYGEN Inhale 2.5 L/min into the lungs See admin instructions. USES OXYGEN EVERY BEDTIME USES DURING DAY ONLY AS NEEDED FOR SHORTNESS OF BREATH    . potassium chloride SA (K-DUR,KLOR-CON) 20 MEQ tablet Take 1 tablet (20 mEq total) by mouth 2 (two) times daily. 6 tablet 6  . SYMBICORT 160-4.5 MCG/ACT inhaler INHALE 2 PUFFS INTO THE LUNGS 2 TIMES DAILY 10.2 Inhaler 5  . SYNTHROID 100 MCG tablet Take 2 tablets by mouth every morning.  11  . tamsulosin (FLOMAX) 0.4 MG CAPS capsule Take 1 capsule (0.4 mg total) by mouth daily. 30 capsule 1  . torsemide (DEMADEX) 20 MG tablet Take 2 tablets (40 mg total) by mouth daily. Take 40mg (2 tabs) twice daily for 4 DAYS, then reduce to 40mg (2 tabs) am and 20mg  (1 tab) pm. 100 tablet 11  . triamcinolone cream (KENALOG) 0.5 % Apply topically 2 (two) times daily. 60 g 2  . vitamin B-12 (CYANOCOBALAMIN) 1000 MCG tablet Take 1,000 mcg by mouth daily.    [name] warfarin (COUMADIN) 5 MG tablet Take 1 tablet by mouth daily or as directed by coumadin clinic. (Patient taking differently: Take 2.5-5 mg by mouth daily at 6 PM. 2.5 MG on Monday Wednesday  Friday. 5 mg all other days.) 30 tablet 0  . amiodarone (PACERONE) 200 MG tablet Take 200 mg (1 tab) twice daily for 1 WEEK, then decrease to 200 mg (1 tab) once daily. 40 tablet 9  . spironolactone (ALDACTONE) 25 MG tablet Take 0.5 tablets (12.5 mg total) by mouth daily. 45 tablet 3   No current facility-administered medications for this encounter.  Review of Systems  Cardiovascular: Positive for  dyspnea on exertion.  All other systems reviewed and are negative.    PHYSICAL EXAM: VS:  BP 116/84 mmHg  Pulse 73  Wt 201 lb 12 oz (91.513 kg)  SpO2 97%    Wt Readings from Last 3 Encounters:  04/22/16 201 lb 12 oz (91.513 kg)  04/22/16 201 lb (91.173 kg)  04/15/16 215 lb 14.1 oz (97.922 kg)     GEN: Well nourished, well developed, in no acute distress. Wife and brother in law present.  HEENT: normal  Neck: JVP 12 cm Cardiac:  Normal S1/S2, RRR; 1/6 HSM LLSB,  no rubs or gallops, no bilateral LE edema   Respiratory:  Decreased breath sounds bilaterally, no wheezing, rhonchi or rales. GI: soft, nontender, nondistended, + BS MS: no deformity or atrophy Skin: warm and dry  Neuro:  CNs II-XII intact, Strength and sensation are intact Psych: Flat affect, not very interactive.    ASSESSMENT AND PLAN: 1.  Chronic systolic CHF: Nonischemic cardiomyopathy. Medtronic CRT-D with epicardial lead.  Echo (4/17) with EF 20-25% and moderately decreased RV systolic function.  He is only BiV pacing about 80% of the time due to PACs and PVCs.  On exam, he is volume overloaded.  NYHA class IIIb symptoms. - Increase torsemide to 40 mg bid x 4 days then 40 qam/20 qpm.  BMET in 10 days. - Continue current hydralazine/Imdur and Coreg.  - Start spironolactone at 12.5 mg daily.  BMET 10 days.   - Due to low percentage of BiV pacing, we increased base rate to see if we could increase the BiV pacing percentage.  Amiodarone may also help by decreasing PACs and PVCs.  2.  PAF:  Maintaining NSR since DCCV. - Start amiodarone 200 mg bid x 1 week then 200 mg daily.  This was stopped in the past due to hypothyroidism, but he is on thyroid replacement now.   - Continue warfarin.  3.  CKD: Creatinine baseline 1.8-2.1.  Follow closely with increased torsemide.  4.  PAD:  Followed by [facility].  Marca Ancona 04/23/2016

## 2016-04-23 NOTE — Patient Outreach (Signed)
Weekly transition of care call placed to member's wife, [name], as she is the contact person for member.  She state that the member is "not doing too good today.  He is still out of breath when trying to do things with him."  She report that the member was seen by his the heart failure clinic and his PCP yesterday.  They remain concerned about his heart failure/fluid status.  She state that he was prescribed additional medications yesterday but was told by the pharmacy that there was a contraindication/warning message that appeared when trying to fill them.  Wife state that the home health nurse contact the office today to inquire about instructions since the new medications are not ready, but she has not received a call back yet.  Encouraged to contact them again today to obtain instructions as today is Friday and she may not be able to contact them again until next week.  She verbalizes understanding and state she will call immediately.  She report that he continues to be weighed daily, today 201 pounds.  She is concerned that he remains up from his normal of 190 pounds, but understand that the new medications will help with fluid status and swelling.  She denies any other concerns today, initial home visit confirmed for next week.  Encouraged to contact with questions.  Kemper Durie, BSN, [facility] Care Management  [facility] Care Manager [phone]

## 2016-04-23 NOTE — Patient Outreach (Signed)
[facility]) Care Management  04/23/2016  Frank Mclean 07/21/1941 [id number]   Request received from [name], LCSW to mail patient private agency resources, community health response information and a PCS application. Information mailed today, 04/23/16.

## 2016-04-26 ENCOUNTER — Other Ambulatory Visit: Payer: Self-pay | Admitting: Pharmacist

## 2016-04-26 ENCOUNTER — Ambulatory Visit (INDEPENDENT_AMBULATORY_CARE_PROVIDER_SITE_OTHER): Payer: Medicare Other | Admitting: Interventional Cardiology

## 2016-04-26 ENCOUNTER — Encounter: Payer: Self-pay | Admitting: *Deleted

## 2016-04-26 DIAGNOSIS — I5022 Chronic systolic (congestive) heart failure: Secondary | ICD-10-CM | POA: Diagnosis not present

## 2016-04-26 DIAGNOSIS — Z5181 Encounter for therapeutic drug level monitoring: Secondary | ICD-10-CM

## 2016-04-26 DIAGNOSIS — I130 Hypertensive heart and chronic kidney disease with heart failure and stage 1 through stage 4 chronic kidney disease, or unspecified chronic kidney disease: Secondary | ICD-10-CM | POA: Diagnosis not present

## 2016-04-26 DIAGNOSIS — I6350 Cerebral infarction due to unspecified occlusion or stenosis of unspecified cerebral artery: Secondary | ICD-10-CM

## 2016-04-26 LAB — POCT INR: INR: 2.2

## 2016-04-26 NOTE — Patient Outreach (Signed)
[facility]) Care Management  04/26/2016  KEREK VARGHESE [birth year] [id number]   Delford Liechty is a 74yo who was referred to [facility] for medication review.  Outreach call to patient's wife to review medications.  Patient's wife is patient's primary contact.  Patient's phone was answered by [name], who reports, patient's wife is unavailable at this time.  I left a HIPAA compliant voicemail with [name] and asked for patient's wife to return my phone call.  I will make a second outreach attempt within one week if patient's wife does not return my phone call.    Lilla Shook, Pharm.D. Pharmacy Resident [facility] [phone]

## 2016-04-27 ENCOUNTER — Other Ambulatory Visit: Payer: Self-pay | Admitting: *Deleted

## 2016-04-27 ENCOUNTER — Other Ambulatory Visit: Payer: Self-pay | Admitting: Internal Medicine

## 2016-04-27 ENCOUNTER — Other Ambulatory Visit (HOSPITAL_COMMUNITY): Payer: Self-pay | Admitting: *Deleted

## 2016-04-27 DIAGNOSIS — I130 Hypertensive heart and chronic kidney disease with heart failure and stage 1 through stage 4 chronic kidney disease, or unspecified chronic kidney disease: Secondary | ICD-10-CM | POA: Diagnosis not present

## 2016-04-27 DIAGNOSIS — I5022 Chronic systolic (congestive) heart failure: Secondary | ICD-10-CM | POA: Diagnosis not present

## 2016-04-27 MED ORDER — POTASSIUM CHLORIDE CRYS ER 20 MEQ PO TBCR: 20.0000 meq | EXTENDED_RELEASE_TABLET | Freq: Two times a day (BID) | ORAL | Status: DC

## 2016-04-27 MED ORDER — AMLODIPINE BESYLATE 10 MG PO TABS: 10.0000 mg | ORAL_TABLET | Freq: Every day | ORAL | Status: DC

## 2016-04-28 ENCOUNTER — Ambulatory Visit (HOSPITAL_COMMUNITY)
Admission: RE | Admit: 2016-04-28 | Discharge: 2016-04-28 | Disposition: A | Discharge status: Home / Self Care | Payer: Medicare Other | Source: Ambulatory Visit | Attending: Cardiology | Admitting: Cardiology

## 2016-04-28 ENCOUNTER — Encounter (HOSPITAL_COMMUNITY): Payer: Self-pay

## 2016-04-28 VITALS — BP 104/74 | HR 69 | Wt 193.5 lb

## 2016-04-28 DIAGNOSIS — I5043 Acute on chronic combined systolic (congestive) and diastolic (congestive) heart failure: Secondary | ICD-10-CM

## 2016-04-28 DIAGNOSIS — E039 Hypothyroidism, unspecified: Secondary | ICD-10-CM | POA: Diagnosis not present

## 2016-04-28 DIAGNOSIS — I480 Paroxysmal atrial fibrillation: Secondary | ICD-10-CM

## 2016-04-28 DIAGNOSIS — Z95810 Presence of automatic (implantable) cardiac defibrillator: Secondary | ICD-10-CM | POA: Insufficient documentation

## 2016-04-28 DIAGNOSIS — Z7901 Long term (current) use of anticoagulants: Secondary | ICD-10-CM | POA: Insufficient documentation

## 2016-04-28 DIAGNOSIS — I5022 Chronic systolic (congestive) heart failure: Secondary | ICD-10-CM | POA: Insufficient documentation

## 2016-04-28 DIAGNOSIS — M109 Gout, unspecified: Secondary | ICD-10-CM | POA: Diagnosis not present

## 2016-04-28 DIAGNOSIS — Z8673 Personal history of transient ischemic attack (TIA), and cerebral infarction without residual deficits: Secondary | ICD-10-CM | POA: Insufficient documentation

## 2016-04-28 DIAGNOSIS — I130 Hypertensive heart and chronic kidney disease with heart failure and stage 1 through stage 4 chronic kidney disease, or unspecified chronic kidney disease: Secondary | ICD-10-CM | POA: Insufficient documentation

## 2016-04-28 DIAGNOSIS — I739 Peripheral vascular disease, unspecified: Secondary | ICD-10-CM | POA: Insufficient documentation

## 2016-04-28 DIAGNOSIS — N189 Chronic kidney disease, unspecified: Secondary | ICD-10-CM | POA: Insufficient documentation

## 2016-04-28 DIAGNOSIS — F0390 Unspecified dementia without behavioral disturbance: Secondary | ICD-10-CM | POA: Insufficient documentation

## 2016-04-28 DIAGNOSIS — Z79899 Other long term (current) drug therapy: Secondary | ICD-10-CM | POA: Diagnosis not present

## 2016-04-28 DIAGNOSIS — E785 Hyperlipidemia, unspecified: Secondary | ICD-10-CM | POA: Diagnosis not present

## 2016-04-28 DIAGNOSIS — I429 Cardiomyopathy, unspecified: Secondary | ICD-10-CM

## 2016-04-28 DIAGNOSIS — I428 Other cardiomyopathies: Secondary | ICD-10-CM | POA: Diagnosis not present

## 2016-04-28 LAB — BRAIN NATRIURETIC PEPTIDE: B NATRIURETIC PEPTIDE 5: 823.7 pg/mL — AB (ref 0.0–100.0)

## 2016-04-28 LAB — COMPREHENSIVE METABOLIC PANEL
ALK PHOS: 130 U/L — AB (ref 38–126)
ALT: 30 U/L (ref 17–63)
ANION GAP: 11 (ref 5–15)
AST: 23 U/L (ref 15–41)
Albumin: 3 g/dL — ABNORMAL LOW (ref 3.5–5.0)
BILIRUBIN TOTAL: 0.9 mg/dL (ref 0.3–1.2)
BUN: 28 mg/dL — ABNORMAL HIGH (ref 6–20)
CALCIUM: 8.7 mg/dL — AB (ref 8.9–10.3)
CO2: 27 mmol/L (ref 22–32)
Chloride: 110 mmol/L (ref 101–111)
Creatinine, Ser: 2.34 mg/dL — ABNORMAL HIGH (ref 0.61–1.24)
GFR calc Af Amer: 29 mL/min — ABNORMAL LOW (ref >60)
GFR, EST NON AFRICAN AMERICAN: 25 mL/min — AB (ref >60)
GLUCOSE: 127 mg/dL — AB (ref 65–99)
POTASSIUM: 3.1 mmol/L — AB (ref 3.5–5.1)
SODIUM: 148 mmol/L — AB (ref 135–145)
TOTAL PROTEIN: 5.3 g/dL — AB (ref 6.5–8.1)

## 2016-04-28 MED ORDER — AMLODIPINE BESYLATE 10 MG PO TABS: 5.0000 mg | ORAL_TABLET | Freq: Every day | ORAL | Status: DC

## 2016-04-28 MED ORDER — HYDRALAZINE HCL 25 MG PO TABS: 37.5000 mg | ORAL_TABLET | Freq: Three times a day (TID) | ORAL | Status: DC

## 2016-04-28 MED ORDER — TORSEMIDE 20 MG PO TABS: 40.0000 mg | ORAL_TABLET | Freq: Two times a day (BID) | ORAL | Status: DC

## 2016-04-28 NOTE — Patient Outreach (Signed)
[facility]) Care Management  04/28/2016  MACKENZY CERASUOLO 09/23/1941 [id number]   Patient triggered RED on EMMI Heart Failure dashboard, notification sent to Kemper Durie, RN.  Thanks, [name]. [name] [facility] Care Management [facility] CM Assistant Phone: [phone] Fax: [phone]

## 2016-04-28 NOTE — Patient Instructions (Signed)
Take Hydralazine 37.5mg  (1 1/2 tablets) three times daily.  Take Torsemide 40mg  (2 tablets) twice daily.  Take Amlodipine 5mg  (1/2 tablet) daily  Routine lab work today. Will notify you of abnormal results  Follow up with Dr.McLean in 10 days

## 2016-04-28 NOTE — Progress Notes (Signed)
Patient ID: Frank Mclean, male   DOB: Nov 18, 1941, 75 y.o.   MRN: [id number]  PCP:  [name], MD  Electrophysiologist:  Dr. Lewayne Bunting  HF Cardiology: Dr Shirlee Latch   History of Present Illness: Frank Mclean is a 75 y.o. 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   He was seen in the ER on 06/09/15 with dyspnea and LE edema at which time Hgb was 8.6 (runs 8-10 range), Cr 1.85 (most recently 1.93), BNP 384. CXR had shown small left pleural effusion and COPD. He was noted to have wheezing and rhonchi. He was treated with IV Lasix.    In 9/16, LV epicardial lead was placed.    He was admitted in 3/17 with hypernatremia, TUI, and worsening dementia.  He was seen by Tereso Newcomer in 4/17 and was noted to be in atrial fibrillation with increased weight.  He was admitted and diuresed.  He was cardioverted to NSR.  Last echo in 4/17 showed EF 20-25%.   He is now back at home.  He is wearing oxygen at night.  At last appointment, I increased his torsemide due to volume overload.  Weight is down 8 lbs . He is walking with his walker, doing ok if he goes slow.  Dyspnea when he "hurries."  No orthopnea/PND.  No chest pain. No lightheadedness.    Optivol was checked today: fluid index remains > threshold but is trending down and impedance is trending up.  He has been in NSR since 4/17.   ECG: A-BiV sequential pacing     Labs (7/15): LDL 186 Labs (2/16): K 4.9, creatinine 2.91 => 2.62, proBNP 432, HCT 34.2 Labs (7/16): creatinine 2.0, K 4.1 Labs (5/17): K 3.6, Na 150, creatinine 1.97, HCT 33.3, TSH normal  PMH: 1. CKD: Followed by Dr Lowell Guitar. 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 He is now off amiodarone.  3. Cardiomyopathy: Thought to be  nonischemic. He had cardiac cath in 4/12 with mild nonobstructive disease. Echo (6/14) with EF 25-30%, prominent apical trabeculations, diffuse hypokinesis, mild MR. Lexiscan Cardiolite in 1/16 showed a large, severe primarily fixed inferior and apical perfusion defect with EF 25%, suggestive of prior infarct with mild peri-infarct ischemia.  RHC (2/16) with mean RA 6, PA 62/32 mean 46, PCWP 30, CI 1.81.  Echo (3/16) with EF 30-35%, diffuse hypokinesis, mildly dilated RV with moderately decreased systolic function, moderate MR, PASP 54 mmHg. Medtronic ICD (3/16), unable to place LV lead.  - 9/16 epicardial lead placed.  - Echo (4/17) with EF 20-25%, moderate LV dilation, moderately dilated RV with mildly decreased systolic function, moderate to severe TR.  4. CVA: 2004.  5. HTN 6. Gout 7. Hyperlipidemia 8. BPH 9. H/o LBBB 10. Atrial fibrillation: Paroxysmal. DCCV in 4/17.  11. PAD: Has been followed by [facility] (Dr Darrick Penna). History of foot ulcers, now healed.  12. Anemia: Suspect related to CKD.  13. Dementia  SH: Married, lives in [place], never smoked, no ETOH.   FH: Father lived into his 66s, no significant cardiac disease.   ROS: All systems reviewed and negative except as per HPI.   Current Outpatient Prescriptions  Medication Sig Dispense Refill  . acetaminophen (TYLENOL) 325 MG tablet Take 1 tablet (325 mg total) by mouth every 6 (six) hours as needed for  mild pain (or Fever >/= 101).    [name] amiodarone (PACERONE) 200 MG tablet Take 200 mg (1 tab) twice daily for 1 WEEK, then decrease to 200 mg (1 tab) once daily. 40 tablet 9  . amLODipine (NORVASC) 10 MG tablet Take 0.5 tablets (5 mg total) by mouth daily. 30 tablet 5  . atorvastatin (LIPITOR) 20 MG tablet Take 1 tablet (20 mg total) by mouth daily. 90 tablet 3  . carvedilol (COREG) 6.25 MG tablet Take 1 tablet (6.25 mg total) by mouth 2 (two) times daily with a meal. 60 tablet 6  . Cholecalciferol (VITAMIN D-3 PO) Take 1,000 Units  by mouth daily.    [name] escitalopram (LEXAPRO) 5 MG tablet TAKE 1 TABLET BY MOUTH EVERY DAY 30 tablet 3  . ferrous sulfate 325 (65 FE) MG tablet Take 1 tablet (325 mg total) by mouth daily. 30 tablet 11  . hydrALAZINE (APRESOLINE) 25 MG tablet Take 1.5 tablets (37.5 mg total) by mouth 3 (three) times daily.    . isosorbide mononitrate (IMDUR) 30 MG 24 hr tablet Take 1 tablet (30 mg total) by mouth 2 (two) times daily.    [name] KLOR-CON M20 20 MEQ tablet TAKE 3 TABS TWICE DAILY 180 tablet 5  . Memantine HCl-Donepezil HCl (NAMZARIC) 28-10 MG CP24 Take 1 tablet by mouth at bedtime. 30 capsule 11  . Memantine HCl-Donepezil HCl (NAMZARIC) 7 & 14 & 21 &28 -10 MG C4PK Take 1 tablet by mouth at bedtime. 30 each 0  . Menthol, Topical Analgesic, (BIOFREEZE) 4 % GEL Apply topically 2 (two) times daily. Apply to left shoulder and left ankle    . OXYGEN Inhale 2.5 L/min into the lungs See admin instructions. USES OXYGEN EVERY BEDTIME USES DURING DAY ONLY AS NEEDED FOR SHORTNESS OF BREATH    . potassium chloride SA (K-DUR,KLOR-CON) 20 MEQ tablet Take 1 tablet (20 mEq total) by mouth 2 (two) times daily. 60 tablet 6  . spironolactone (ALDACTONE) 25 MG tablet Take 0.5 tablets (12.5 mg total) by mouth daily. 45 tablet 3  . SYMBICORT 160-4.5 MCG/ACT inhaler INHALE 2 PUFFS INTO THE LUNGS 2 TIMES DAILY 10.2 Inhaler 5  . SYNTHROID 100 MCG tablet Take 2 tablets by mouth every morning.  11  . tamsulosin (FLOMAX) 0.4 MG CAPS capsule Take 1 capsule (0.4 mg total) by mouth daily. 30 capsule 1  . torsemide (DEMADEX) 20 MG tablet Take 2 tablets (40 mg total) by mouth 2 (two) times daily. 100 tablet 11  . triamcinolone cream (KENALOG) 0.5 % Apply topically 2 (two) times daily. 60 g 2  . vitamin B-12 (CYANOCOBALAMIN) 1000 MCG tablet Take 1,000 mcg by mouth daily.    [name] warfarin (COUMADIN) 5 MG tablet Take 1 tablet by mouth daily or as directed by coumadin clinic. (Patient taking differently: Take 2.5-5 mg by mouth daily at 6 PM. 2.5  MG on Monday Wednesday  Friday. 5 mg all other days.) 30 tablet 0   No current facility-administered medications for this encounter.  Review of Systems  Cardiovascular: Positive for dyspnea on exertion.  All other systems reviewed and are negative.    PHYSICAL EXAM: VS:  BP 104/74 mmHg  Pulse 69  Wt 193 lb 8 oz (87.771 kg)  SpO2 98%    Wt Readings from Last 3 Encounters:  04/28/16 193 lb 8 oz (87.771 kg)  04/22/16 201 lb 12 oz (91.513 kg)  04/22/16 201 lb (91.173 kg)     GEN: Well nourished, well developed, in no  acute distress. Wife and brother in law present.  HEENT: normal  Neck: JVP 8-9 cm Cardiac:  Normal S1/S2, RRR; 2/6 HSM LLSB,  no rubs or gallops, 1+ ankle edema.    Respiratory:  Decreased breath sounds bilaterally, no wheezing, rhonchi or rales. GI: soft, nontender, nondistended, + BS MS: no deformity or atrophy Skin: warm and dry  Neuro:  CNs II-XII intact, Strength and sensation are intact Psych: Flat affect, not very interactive.    ASSESSMENT AND PLAN: 1.  Chronic systolic CHF: Nonischemic cardiomyopathy. Medtronic CRT-D with epicardial lead. Echo (4/17) with EF 20-25% and moderately decreased RV systolic function. NYHA class III symptoms.  Weight is down with increased torsemide.  He is still volume overloaded but looks better.  Thoracic impedance trending up on Optivol. - Continue torsemide at 40 qam/20 qpm.   - Continue current Coreg and spironolactone.  - Increase hydralazine to 37.5 mg tid, continue Imdur 30 bid.  Since I am increasing hydralazine and BP is soft, I will decrease amlodipine to 5 mg daily.   - BMET today.  - PACs/PVCs have been lessening his BiV pacing percentage. 2.  PAF:  Maintaining NSR since DCCV. - Continue amiodarone, can decrease to 200 mg daily.  This was stopped in the past due to hypothyroidism, but he is on thyroid replacement now.  Check LFTs today.  He will need regular followup with an eye doctor while on amiodarone.  - Continue  warfarin.  3.  CKD: Creatinine baseline 1.8-2.1.  BMET today.  4.  PAD:  Followed by [facility].  Marca Ancona 04/28/2016

## 2016-04-29 ENCOUNTER — Other Ambulatory Visit: Payer: Self-pay | Admitting: *Deleted

## 2016-04-29 ENCOUNTER — Encounter: Payer: Self-pay | Admitting: *Deleted

## 2016-04-29 DIAGNOSIS — I5022 Chronic systolic (congestive) heart failure: Secondary | ICD-10-CM | POA: Diagnosis not present

## 2016-04-29 DIAGNOSIS — I130 Hypertensive heart and chronic kidney disease with heart failure and stage 1 through stage 4 chronic kidney disease, or unspecified chronic kidney disease: Secondary | ICD-10-CM | POA: Diagnosis not present

## 2016-04-29 NOTE — Patient Outreach (Signed)
[facility] [facility]) Care Management   04/29/2016  Frank Mclean 10-26-1941 [id number]  Frank Mclean is an 75 y.o. male  Subjective:   Member states that he is doing "good."  Brother in Sports coach confirms that the member as been doing well "this week.  He's a different person this week.  If you were to come see him last week, his head wouldn't have been as clear."  Member denies any shortness of breath at this time, states he only has to wear his oxygen at night.  Brother in law state that there has been someone available with the member to ensure that he is taking his medications properly.  Objective:   Review of Systems  Constitutional: Negative.   HENT: Negative.   Eyes: Negative.   Respiratory: Negative.   Cardiovascular: Negative.   Gastrointestinal: Negative.   Genitourinary: Negative.   Musculoskeletal: Negative.   Skin: Negative.   Neurological: Negative.   Endo/Heme/Allergies: Negative.   Psychiatric/Behavioral: Negative.     Physical Exam  Constitutional: He is oriented to person, place, and time. He appears well-developed and well-nourished.  Neck: Normal range of motion.  Cardiovascular: Normal rate.   Atrial Fibrillation  Respiratory: Effort normal and breath sounds normal.  GI: Soft. Bowel sounds are normal.  Musculoskeletal: Normal range of motion.  Neurological: He is alert and oriented to person, place, and time.  Skin: Skin is warm and dry.    BP 118/72 mmHg  Pulse 68  Resp 18  Ht 1.88 m (_0 )  Wt 191 lb (86.637 kg)  BMI 24.51 kg/m2  SpO2 97%   Encounter Medications:   Outpatient Encounter Prescriptions as of 04/29/2016  Medication Sig  . acetaminophen (TYLENOL) 325 MG tablet Take 1 tablet (325 mg total) by mouth every 6 (six) hours as needed for mild pain (or Fever >/= 101).  [name] amiodarone (PACERONE) 200 MG tablet Take 200 mg (1 tab) twice daily for 1 WEEK, then decrease to 200 mg (1 tab) once daily.  [name] amLODipine (NORVASC) 10 MG tablet Take  0.5 tablets (5 mg total) by mouth daily.  [name] atorvastatin (LIPITOR) 20 MG tablet Take 1 tablet (20 mg total) by mouth daily.  . carvedilol (COREG) 6.25 MG tablet Take 1 tablet (6.25 mg total) by mouth 2 (two) times daily with a meal.  . Cholecalciferol (VITAMIN D-3 PO) Take 1,000 Units by mouth daily.  [name] escitalopram (LEXAPRO) 5 MG tablet TAKE 1 TABLET BY MOUTH EVERY DAY  . ferrous sulfate 325 (65 FE) MG tablet Take 1 tablet (325 mg total) by mouth daily.  . hydrALAZINE (APRESOLINE) 25 MG tablet Take 1.5 tablets (37.5 mg total) by mouth 3 (three) times daily.  . isosorbide mononitrate (IMDUR) 30 MG 24 hr tablet Take 1 tablet (30 mg total) by mouth 2 (two) times daily.  [name] KLOR-CON M20 20 MEQ tablet TAKE 3 TABS TWICE DAILY  . Memantine HCl-Donepezil HCl (NAMZARIC) 28-10 MG CP24 Take 1 tablet by mouth at bedtime.  . Memantine HCl-Donepezil HCl (NAMZARIC) 7 & 14 & 21 &28 -10 MG C4PK Take 1 tablet by mouth at bedtime.  . Menthol, Topical Analgesic, (BIOFREEZE) 4 % GEL Apply topically 2 (two) times daily. Apply to left shoulder and left ankle  . OXYGEN Inhale 2.5 L/min into the lungs See admin instructions. USES OXYGEN EVERY BEDTIME USES DURING DAY ONLY AS NEEDED FOR SHORTNESS OF BREATH  . potassium chloride SA (K-DUR,KLOR-CON) 20 MEQ tablet Take 1 tablet (20 mEq total) by mouth  2 (two) times daily.  [name] spironolactone (ALDACTONE) 25 MG tablet Take 0.5 tablets (12.5 mg total) by mouth daily.  . SYMBICORT 160-4.5 MCG/ACT inhaler INHALE 2 PUFFS INTO THE LUNGS 2 TIMES DAILY  . SYNTHROID 100 MCG tablet Take 2 tablets by mouth every morning.  . tamsulosin (FLOMAX) 0.4 MG CAPS capsule Take 1 capsule (0.4 mg total) by mouth daily.  [name] torsemide (DEMADEX) 20 MG tablet Take 2 tablets (40 mg total) by mouth 2 (two) times daily.  [name] triamcinolone cream (KENALOG) 0.5 % Apply topically 2 (two) times daily.  . vitamin B-12 (CYANOCOBALAMIN) 1000 MCG tablet Take 1,000 mcg by mouth daily.  [name] warfarin (COUMADIN) 5 MG  tablet Take 1 tablet by mouth daily or as directed by coumadin clinic. (Patient taking differently: Take 2.5-5 mg by mouth daily at 6 PM. 2.5 MG on Monday Wednesday  Friday. 5 mg all other days.)   No facility-administered encounter medications on file as of 04/29/2016.    Patient was recently discharged from hospital and all medications have been reviewed.  Functional Status:   In your present state of health, do you have any difficulty performing the following activities: 04/29/2016 04/06/2016  Hearing? N N  Vision? Y N  Difficulty concentrating or making decisions? Y N  Walking or climbing stairs? Y Y  Dressing or bathing? N N  Doing errands, shopping? Y -  Conservation officer, nature and eating ? N -  Using the Toilet? N -  In the past six months, have you accidently leaked urine? Y -  Do you have problems with loss of bowel control? Y -  Managing your Medications? Y -  Managing your Finances? Y -  Housekeeping or managing your Housekeeping? Y -    Fall/Depression Screening:    PHQ 2/9 Scores 04/29/2016 04/22/2016 04/21/2015  PHQ - 2 Score 1 3 0   Fall Risk  04/29/2016 04/22/2016 04/21/2015  Falls in the past year? Yes Yes Yes  Number falls in past yr: 2 or more 2 or more 1  Injury with Fall? No No No  Risk Factor Category  High Fall Risk - -  Follow up Falls prevention discussed;Education provided - -    Assessment:    Wife not present during home visit, instead brother in law is with member.  He state that he has been taking the member to his follow up appointments, and report that the idea of cardiac rehab was presented to the member.  Discussed the details of the program, they will consider involvement.    Brother in Sports coach (as well as wife) has expressed concern with member's memory.  He was prescribed a medication (Namzaric) but has not started taking it yet due to availability and cost.  Brother in Sports coach state that he feels the [name] has improved over the past week, and that they prefer  to wait on filling it.  Encouraged call pharmacist Apolonio Schneiders has been in contact with them,contact information provided) concerning the cost should they decide to fill it.   They also continue to inquire about additional assistance in the home.  Made aware that social worker referral has been made, informed that J. Saporito will be in contact.  Contact information provided.  Brother in law states that the wife has all education material on heart failure management, denies any direct questions/concerns at this time.  Encouraged to contact with concerns.  Plan:   Will continue with weekly transition of care calls next week.  [facility] CM Care  Plan Problem One        Most Recent Value   Care Plan Problem One  Recent hospitalizaiton   Role Documenting the Problem One  Care Management Coordinator   Care Plan for Problem One  Active   [facility] Long Term Goal (31-90 days)  Member will not be readmitted to hospital within the next 31 days   [facility] Long Term Goal Start Date  04/16/16   Interventions for Problem One Long Term Goal  Discussed with member the importance of following discharge instructions, including follow up appointments, medications, diet, and home health involvement Alvis Lemmings confirmed), to decrease the risk of readmission   [facility] CM Short Term Goal #1 (0-30 days)  Member will have follow up appointment within the next 2 weeks   [facility] CM Short Term Goal #1 Start Date  04/16/16   [facility] CM Short Term Goal #1 Met Date  04/23/16   Interventions for Short Term Goal #1  Follow up appointment confirmed, transportation confirmed (wife will transport)   [facility] CM Short Term Goal #2 (0-30 days)  Member will report taking all medicaitons within the next 4 weeks   [facility] CM Short Term Goal #2 Start Date  04/16/16   Interventions for Short Term Goal #2  Medication list reviewed, discussed importance in taking as prescribed, wife verifies that she assist with compliance   [facility] CM Short Term Goal #3 (0-30 days)  Member will  report checking weights and recording readings daily over the next 4 weeks   [facility] CM Short Term Goal #3 Start Date  04/16/16   Interventions for Short Tern Goal #3  Wife verbalizes understanding of heart failure zones, including daily weights and when to notify physician     Valente David, BSN, [place] Manager [phone]

## 2016-04-30 ENCOUNTER — Other Ambulatory Visit: Payer: Self-pay | Admitting: Pharmacist

## 2016-04-30 ENCOUNTER — Other Ambulatory Visit (HOSPITAL_COMMUNITY): Payer: Self-pay | Admitting: Cardiology

## 2016-04-30 ENCOUNTER — Other Ambulatory Visit: Payer: Self-pay | Admitting: *Deleted

## 2016-04-30 ENCOUNTER — Other Ambulatory Visit (HOSPITAL_COMMUNITY): Payer: Self-pay | Admitting: *Deleted

## 2016-04-30 ENCOUNTER — Telehealth (HOSPITAL_COMMUNITY): Payer: Self-pay | Admitting: *Deleted

## 2016-04-30 DIAGNOSIS — I5022 Chronic systolic (congestive) heart failure: Secondary | ICD-10-CM | POA: Diagnosis not present

## 2016-04-30 DIAGNOSIS — I130 Hypertensive heart and chronic kidney disease with heart failure and stage 1 through stage 4 chronic kidney disease, or unspecified chronic kidney disease: Secondary | ICD-10-CM | POA: Diagnosis not present

## 2016-04-30 MED ORDER — TORSEMIDE 20 MG PO TABS: 40.0000 mg | ORAL_TABLET | Freq: Two times a day (BID) | ORAL | Status: DC

## 2016-04-30 MED ORDER — HYDRALAZINE HCL 25 MG PO TABS: 37.5000 mg | ORAL_TABLET | Freq: Three times a day (TID) | ORAL | Status: DC

## 2016-04-30 MED ORDER — AMLODIPINE BESYLATE 5 MG PO TABS: 5.0000 mg | ORAL_TABLET | Freq: Every day | ORAL | Status: DC

## 2016-04-30 NOTE — Telephone Encounter (Signed)
[facility] HHRN left VM pt needs refills of Hydralazine, Torsemide and Amlodipine sent into [facility].  Meds sent

## 2016-04-30 NOTE — Patient Outreach (Signed)
[facility]) Care Management  04/30/2016  Frank Mclean Dec 25, 1940 [id number]   CSW received a new referral on patient from patient's RNCM with [facility] Care Management, Kemper Durie, indicating that patient would benefit from social work services and resources to assist with long-term care placement into an assisted or skilled nursing facility, depending on recommended level of care.  Mrs. Frank Mclean reported that patient's wife, Frank Mclean is unable to provide patient with 24 hour care and supervision, as she currently works full-time.  Mrs. Frank Mclean admitted to Mrs. Frank Mclean that patient's needs have increased and that patient requires more assistance with activities of daily living.  Frank Mclean denied being able to pay for private agency sitters, requesting financial assistance to help pay for services in the home. Patient was recently placed into a skilled nursing facility to receive short-term rehabilitative services, so may have already exhausted Medicare days for this fiscal year. CSW made an initial attempt to try and contact patient today to perform phone assessment, as well as assess and assist with social needs and services, without success.  A HIPAA complaint message was left for patient with patient's son, Frank Mclean.  [name] reports that he will have patient return CSW's call at his earliest convenience.  CSW is currently awaiting a return call. Frank Mclean, BSW, MSW, LCSW  Licensed Restaurant manager, fast food [facility]  Mailing [place] [address], [place], [place] [address] Physical Address-[address], [place], [place] [address] Toll Free Main # [phone] Fax # [phone] Cell # [phone]  Fax # [phone]  [name] Patient's preferred language:  [place]   English  ATTENTION:  If you speak [place], language assistance services, free of charge, are available to you.     Nondiscrimination and Accessibility Statement: Discrimination is Against the [name], a subsidiary of [employer], complies with [employer] civil rights laws and does not discriminate on the basis of race, color, national origin, age, disability, or sex.  [facility] Customer service manager does not exclude people or treat them differently because of race, color, national origin, age, disability, or sex.  [facility] Tourist information centre manager Providers will:  . Provide free aids and services to people with disabilities to communicate effectively with [place], such as:     ? Qualified sign language interpreters  ? Written information in other formats (large print, audio, accessible electronic formats, other formats)   . Provide free language services to people whose primary language is not [place], such as:    ? Qualified interpreters    ? Information written in other languages   If you need these services, contact your [facility] Printmaker.  If you believe that a [facility] [employer] has failed to provide these services or discriminated in another way on the basis of race, color, national origin, age, disability, or sex, you can file a Theatre manager with: [facility]'s Anonymous Reporting Helpline, [phone] or [email or web address].fr.  You can file a grievance in person or by mail, fax, or email. If you need help filing a grievance, you may contact Frank Mclean, Interim Compliance Officer, [facility] Department of Compliance and Integrity, [name], [place], [place]. [address], [phone], [name].    You can also file a civil rights complaint with the U.S. Department of Health and Health and safety inspector, Office for [employer], electronically through the Office for Civil Rights Complaint Portal, available at https://mendez-ellison.com/.jsf, or by mail or  phone at:  U.S.  Department of Health and Human Services [address], [place] Room [phone], [facility] Building [place], [place]. [address]  [phone], [phone] (TDD) Complaint forms are available at [email or web address].

## 2016-04-30 NOTE — Patient Outreach (Signed)
[facility]) Care Management  04/30/2016  Frank Mclean 1941-09-10 [id number]   [name] is a 74yo who was referred to [facility] Pharmacy for medication review.  Outreach call to patient's wife to review medications.  Patient's wife is patient's primary contact.  There was no answer.  I left a HIPAA compliant voicemail for patient's wife to return my phone call.  I will make a third outreach attempt within one week if patient's wife does not return my phone call.      Lilla Shook, Pharm.D. Pharmacy Resident [facility] [employer] [phone]

## 2016-05-02 ENCOUNTER — Other Ambulatory Visit: Payer: Self-pay | Admitting: Cardiology

## 2016-05-03 DIAGNOSIS — I5022 Chronic systolic (congestive) heart failure: Secondary | ICD-10-CM | POA: Diagnosis not present

## 2016-05-03 DIAGNOSIS — I130 Hypertensive heart and chronic kidney disease with heart failure and stage 1 through stage 4 chronic kidney disease, or unspecified chronic kidney disease: Secondary | ICD-10-CM | POA: Diagnosis not present

## 2016-05-03 NOTE — Patient Outreach (Signed)
[facility]) Care Management  05/03/2016  Frank Mclean May 14, 1941 [id number]   Patient triggered RED on EMMI Heart Failure dashboard, notification sent to Kemper Durie, RN.  Thanks, [name]. [name] [facility] Care Management [facility] CM Assistant Phone: [phone] Fax: [phone]

## 2016-05-03 NOTE — Patient Outreach (Signed)
[facility]) Care Management  05/03/2016  ENRIGUE SAULNIER 22-Feb-1941 [id number]   Request received from [name], LCSW to mail patient information on private agency sitters and [facility] brochure. Information mailed today, 05/03/16

## 2016-05-04 ENCOUNTER — Other Ambulatory Visit: Payer: Self-pay | Admitting: Pharmacist

## 2016-05-04 NOTE — Patient Outreach (Signed)
[facility]) Care Management  05/04/2016  Frank Mclean [birth year] [id number]   Frank Mclean is a 74yo who was referred to [facility] Pharmacy for medication review.  Outreach call to patient's wife to review medications.  Patient's wife is patient's primary contact.  Explained purpose of phone call to patient's wife was to review patient's medications.  Mrs. Soisson reports that a nurse with [facility] is now filling patient's pill boxes so she is not currently managing his medications.  Scheduled a home visit for 05/05/16 at 11:00 AM to review medications.  This will be a joint home visit with [facility] nurse, Buzz.     Lilla Shook, Pharm.D. Pharmacy Resident [facility] [employer] [phone]

## 2016-05-05 ENCOUNTER — Telehealth (HOSPITAL_COMMUNITY): Payer: Self-pay | Admitting: Pharmacist

## 2016-05-05 ENCOUNTER — Encounter: Payer: Self-pay | Admitting: Internal Medicine

## 2016-05-05 ENCOUNTER — Other Ambulatory Visit: Payer: Self-pay | Admitting: Pharmacist

## 2016-05-05 ENCOUNTER — Other Ambulatory Visit (HOSPITAL_COMMUNITY): Payer: Self-pay | Admitting: Cardiology

## 2016-05-05 DIAGNOSIS — I130 Hypertensive heart and chronic kidney disease with heart failure and stage 1 through stage 4 chronic kidney disease, or unspecified chronic kidney disease: Secondary | ICD-10-CM | POA: Diagnosis not present

## 2016-05-05 DIAGNOSIS — I5022 Chronic systolic (congestive) heart failure: Secondary | ICD-10-CM | POA: Diagnosis not present

## 2016-05-05 MED ORDER — POTASSIUM CHLORIDE CRYS ER 20 MEQ PO TBCR: 40.0000 meq | EXTENDED_RELEASE_TABLET | Freq: Two times a day (BID) | ORAL | Status: DC

## 2016-05-05 MED ORDER — TORSEMIDE 20 MG PO TABS: 20.0000 mg | ORAL_TABLET | Freq: Two times a day (BID) | ORAL | Status: DC

## 2016-05-05 NOTE — Patient Outreach (Signed)
[facility]) Care Management  [facility]   05/05/2016  Frank Mclean Oct 26, 1941 [id number]  Subjective: Frank Mclean is a 75 yo who was referred to [facility] for medication review.  Initial home visit today to review medications.  Patient currently fills all prescriptions at [facility].  Patient uses a weekly pill box for his medications, and pill box is currently filled by patient's home health nurse, Frank Mclean.  Home visit today was made in conjunction with [place] home health nurse.  Patient's wife, Frank Mclean, was also present during part of the visit.  All medications were reviewed with Frank Mclean, the patient, and his wife.    Objective:   Encounter Medications: Outpatient Encounter Prescriptions as of 05/05/2016  Medication Sig Note  . amiodarone (PACERONE) 200 MG tablet Take 200 mg (1 tab) twice daily for 1 WEEK, then decrease to 200 mg (1 tab) once daily. 05/05/2016: Taking amiodarone 200 mg daily.   [name] amLODipine (NORVASC) 5 MG tablet Take 1 tablet (5 mg total) by mouth daily.   [name] atorvastatin (LIPITOR) 20 MG tablet Take 1 tablet (20 mg total) by mouth daily.   . carvedilol (COREG) 6.25 MG tablet Take 1 tablet (6.25 mg total) by mouth 2 (two) times daily with a meal.   . Cholecalciferol (VITAMIN D-3 PO) Take 1,000 Units by mouth daily.   [name] escitalopram (LEXAPRO) 5 MG tablet TAKE 1 TABLET BY MOUTH EVERY DAY   . ferrous sulfate 325 (65 FE) MG tablet Take 1 tablet (325 mg total) by mouth daily.   . hydrALAZINE (APRESOLINE) 25 MG tablet Take 1.5 tablets (37.5 mg total) by mouth 3 (three) times daily.   . isosorbide mononitrate (IMDUR) 30 MG 24 hr tablet Take 1 tablet (30 mg total) by mouth 2 (two) times daily.   . Menthol, Topical Analgesic, (BIOFREEZE) 4 % GEL Apply topically 2 (two) times daily. Apply to left shoulder and left ankle   . OXYGEN Inhale 2.5 L/min into the lungs See admin instructions. Reported on 05/05/2016   . spironolactone (ALDACTONE) 25 MG tablet Take  0.5 tablets (12.5 mg total) by mouth daily.   . SYMBICORT 160-4.5 MCG/ACT inhaler INHALE 2 PUFFS INTO THE LUNGS 2 TIMES DAILY   . SYNTHROID 100 MCG tablet Take 2 tablets by mouth every morning.   . triamcinolone cream (KENALOG) 0.5 % Apply topically 2 (two) times daily.   . vitamin B-12 (CYANOCOBALAMIN) 1000 MCG tablet Take 1,000 mcg by mouth daily.   [name] warfarin (COUMADIN) 5 MG tablet TAKE 1 TABLET BY MOUTH DAILY OR AS DIRECTED BY COUMADIN CLINIC.   . [DISCONTINUED] KLOR-CON M20 20 MEQ tablet TAKE 3 TABS TWICE DAILY (Patient taking differently: TAKE 4 TABS TWICE DAILY)   . [DISCONTINUED] torsemide (DEMADEX) 20 MG tablet Take 2 tablets (40 mg total) by mouth 2 (two) times daily. (Patient taking differently: Take 40 mg by mouth 2 (two) times daily. Taking 40 mg in the morning and 20 mg in the evening)   . acetaminophen (TYLENOL) 325 MG tablet Take 1 tablet (325 mg total) by mouth every 6 (six) hours as needed for mild pain (or Fever >/= 101). (Patient not taking: Reported on 05/04/2016)   . Memantine HCl-Donepezil HCl (NAMZARIC) 28-10 MG CP24 Take 1 tablet by mouth at bedtime. (Patient not taking: Reported on 04/29/2016)   . Memantine HCl-Donepezil HCl (NAMZARIC) 7 & 14 & 21 &28 -10 MG C4PK Take 1 tablet by mouth at bedtime. (Patient not taking: Reported on  04/29/2016)   . [DISCONTINUED] potassium chloride SA (K-DUR,KLOR-CON) 20 MEQ tablet Take 1 tablet (20 mEq total) by mouth 2 (two) times daily. (Patient not taking: Reported on 05/05/2016)   . [DISCONTINUED] tamsulosin (FLOMAX) 0.4 MG CAPS capsule Take 1 capsule (0.4 mg total) by mouth daily. (Patient not taking: Reported on 05/05/2016)    No facility-administered encounter medications on file as of 05/05/2016.   Functional Status: In your present state of health, do you have any difficulty performing the following activities: 04/29/2016 04/06/2016  Hearing? N N  Vision? Y N  Difficulty concentrating or making decisions? Y N  Walking or climbing stairs?  Y Y  Dressing or bathing? N N  Doing errands, shopping? Y -  Quarry manager and eating ? N -  Using the Toilet? N -  In the past six months, have you accidently leaked urine? Y -  Do you have problems with loss of bowel control? Y -  Managing your Medications? Y -  Managing your Finances? Y -  Housekeeping or managing your Housekeeping? Y -   Fall/Depression Screening: PHQ 2/9 Scores 04/29/2016 04/22/2016 04/21/2015  PHQ - 2 Score 1 3 0    Assessment:  Drugs sorted by system:  Neurologic/Psychologic: escitalopram  Cardiovascular: amiodarone, amlodipine, atorvastatin, carvedilol, hydralazine, isosorbide mononitrate, potassium, spironolactone, torsemide, warfarin  Pulmonary/Allergy: Symbicort  Endocrine: Synthroid  Topical: menthol, triamcinolone cream  Pain: acetaminophen PRN (patient reports he is not taking)  Vitamins/Minerals: cholecalciferol, ferrous sulfate, cyanocobalamin   Duplications in therapy: none noted  Gaps in therapy: ACE inhibitor or ARB for LVSD - per review of notes patient is not on ACE inhibitor or ARB secondary to chronic kidney disease   Medications to avoid in the elderly: none noted   Drug interactions: amiodarone and warfarin - amiodarone may inhibit the hepatic metabolism and increase the anticoagulant effect of warfarin.   Other issues noted:  - Amlodipine: Per review of patient's chart, amlodipine was discontinued on 03/07/16 when patient was discharged from the hospital.  Amlodipine prescription [facility] by [facility] and refill request was sent to patient's PCP and medication was added back to patient's profile.  During 04/28/16 heart failure clinic visit, patient was advised to reduce amlodipine dose from 10 mg daily to 5 mg daily.  Identified that patient was not taking amlodipine prior to heart failure clinic visit on 04/28/16, but started taking amlodipine 5 mg daily following heart failure clinic visit.  Blood pressure today =  128/80.  Will contact the heart failure clinic to clarify whether patient should continue taking amlodipine.  Patient and his wife requested to [facility] to prevent future discontinued medications from being refilled.  [facility] nurse called [facility] and requested to have patient's automatic refills cancelled.   - Potassium and torsemide:  Patient has two potassium prescriptions listed on his medication list (potassium 20 mEq BID and 60 mEq BID).  Per review of patient's chart, patient was instructed to increase potassium dose to 80 mEq BID and reduce torsemide dose to 40 mg in the morning and 20 mEq in the evening following heart failure visit on 04/28/16.  Instructions were left in a voicemail on patient's home phone.  Patient's wife reports she did not receive message.  Patient has continued to take potassium 20 mEq BID and torsemide 40 mg BID.  Patient was advised to start taking torsemide 40 mg in the morning and 20 mg in the evening as instructed by Dr. Shirlee Latch, and  pill box was filled by home health nurse with 40 mg AM and 20 mg PM.  Will contact the heart failure clinic to clarify whether patient should increase potassium from 20 mEq BID to 80 mEq BID.    Patient reports he is not taking the following medications on his medication list:  - Tamsulosin:  It appears tamsulosin was initiated in September 2016 for urinary retention.  Patient reports he does not remember ever taking tamsulosin.  Patient denies any difficulty urinating at this time.   - Namzaric:  Patient was initiated on this medication by his PCP on 04/22/16 due to dementia.  Patient's wife reports they initially did not pick up the medication from the pharmacy due to cost.  I discussed medication cost with patient and his wife and explained that the cost is high because it is a brand name medication and is not formulary on his insurance.  I explained to patient and his wife that if the medication was split into  donepezil and memantine which are both tier 1 medications, then the cost would be significantly reduced.  Mrs. Liska said that she and Mr. Furney had a long discussion about the use of medications for his memory and they decided they do not want to start the medication at this time.    Plan: 1.  I made phone call to the heart failure clinic regaring patient's amlodipine and potassium.  I left a message with Heart Failure Pharmacist, Elizabeth Palau.  I will update patient and his wife once I hear from heart failure clinic.   2.  I will send my note to patient's PCP to update him on [facility] involvement and inform him that patient is not taking tamsulosin and Namzaric.  Will request clarification on whether patient should be taking tamsulosin.     3.  Noted patient was restarted on amiodarone on 04/22/16 (patient reports taking first dose on 04/24/16) which may inhibit the metabolism and increase the anticoagulant effect of warfarin.  Most recent INR was 2.2 on 04/26/16.  I made phone call to Audrie Lia, Pharmacist, at patient's anticoagulation clinic.  Ensured that the anticoagulation was aware that patient was recently restarted on amiodarone.  Patient is scheduled for INR on 05/10/16.  Reviewed signs and symptoms of bleeding with patient and patient denies any signs and symptoms of bleeding.     4.  Follow up home visit scheduled for 05/14/16 at 10:00 AM.     Lilla Shook, Pharm.D. Pharmacy Resident [facility] [phone]

## 2016-05-05 NOTE — Telephone Encounter (Signed)
If he was not on the amlodipine before, should not start it now.

## 2016-05-05 NOTE — Telephone Encounter (Signed)
Received a call from Lilla Shook, [facility] pharmacist, about discrepancies in Mr. Aldama's medication list. He states that he has not taken amlodipine since March but picked up the 5 mg tablets and has been taking those since last HF clinic appt with SBP in the 120s. He also has been continuing to take torsemide 40 mg BID and Fleet Contras advised him yesterday to start taking the prescribed 40 mg qam/20 mg qpm. Also noted that he has been taking KCl 20 meq BID instead of previously noted 60 meq BID. Based on last serum K of 3.1 Dr. Shirlee Latch wanted him to increase to 80 meq BID but since has only been taking 20 meq BID, have advised her to increase to 40 meq BID instead.   [name]. Bonnye Fava, PharmD, BCPS, CPP Clinical Pharmacist Pager: [phone] Phone: [phone] 05/05/2016 12:58 PM

## 2016-05-05 NOTE — Patient Outreach (Signed)
[facility]) Care Management  05/05/2016  RIPLEY STADNIK 10-07-1941 [id number]   Care coordination:  I received phone call from the pharmacist, Elizabeth Palau, at the heart failure clinic.  Discussed discrepancies regarding patient's amlodipine and potassium.  Erika instructed patient to continue amlodipine 5 mg daily.  Patient was also instructed to decrease torsemide to 40 mg in the morning and 20 mg in the evening as previously instructed and increase potassium to 40 mEq BID.  Patient has follow up visit with heart failure clinic on 05/10/16.      I called patient's wife, Melih Hardeman, and updated her on this information.  She voiced understanding.  I informed her that [facility] home health nurse had already placed amlodipine 5 mg daily and torsemide 40 mg AM and 20 mg PM in patient's pill box.  Mrs. Coggeshall will update patient's pill box to include potassium 40 mEq (2 of the 20 mEq tablets) BID.     Lilla Shook, Pharm.D. Pharmacy Resident [facility] [phone]

## 2016-05-06 ENCOUNTER — Encounter: Payer: Self-pay | Admitting: *Deleted

## 2016-05-06 ENCOUNTER — Other Ambulatory Visit: Payer: Self-pay | Admitting: Pharmacist

## 2016-05-06 ENCOUNTER — Other Ambulatory Visit: Payer: Self-pay | Admitting: *Deleted

## 2016-05-06 DIAGNOSIS — I5022 Chronic systolic (congestive) heart failure: Secondary | ICD-10-CM | POA: Diagnosis not present

## 2016-05-06 DIAGNOSIS — I130 Hypertensive heart and chronic kidney disease with heart failure and stage 1 through stage 4 chronic kidney disease, or unspecified chronic kidney disease: Secondary | ICD-10-CM | POA: Diagnosis not present

## 2016-05-06 NOTE — Patient Outreach (Signed)
Call received from wife stating that she has become overwhelmed with involvement of [facility] and Home Health services.  Difference explained again between [facility] and home health, she verbalizes understanding but state that she would like to discontinue services at this time, but will contact [facility] in the future if she feel the member need the additional services.   Will notify PCP, social worker, pharmacist, and care management assistant of case closure.  Kemper Durie, BSN, [facility] Care Management  [facility] Care Manager [phone]

## 2016-05-06 NOTE — Patient Outreach (Signed)
Received message from [facility] reporting that patient and patient's wife, Palash Douse, are no longer interested in nursing, social work, and/or pharmacy services through [facility] Care Management and is requesting to not be contacted further.  I updated patient's home health nurse, Buzz, as joint home visit was scheduled with Buzz on 05/14/16.  I will close pharmacy program at this time as patient has withdrawn from the program.     Lilla Shook, Pharm.D. Pharmacy Resident [facility] [phone]

## 2016-05-06 NOTE — Patient Outreach (Signed)
[facility]) Care Management  05/06/2016  BENGAMIN ARRITT 1940-12-24 [id number]   Care coordination:  Received voicemail from patient's wife yesterday evening asking for clarification on patient's potassium dose.    Returned phone call to patient's wife, Hamlin Bohanan, this morning and reviewed medication instructions from the heart failure clinic with her again.  Advised her that Mr. Boni is to continue amlodipine 5 mg daily, decrease torsemide dose to 40 mg in the morning and 20 mg in the evening, and increase potassium to 40 mEq BID.  She voices understanding.  I informed her that [facility] home health nurse had already placed amlodipine 5 mg daily and torsemide 40 mg in the morning and 20 mg in the evening in patient's pill box.  Mrs. Edmundson confirms she will update patient's pill box to potassium 40 mEq (2 of the 20 mEq tablets) BID.    Plan:  Follow up home visit scheduled for 05/14/16.     Lilla Shook, Pharm.D. Pharmacy Resident [facility] [phone]

## 2016-05-06 NOTE — Patient Outreach (Signed)
[facility]) Care Management  05/06/2016  Frank Mclean [birth year] [id number]   Epic In Basket message received from patient's RNCM with [facility]) Care Management, Kemper Durie reporting that patient and patient's wife, Frank Mclean are no longer interested in nursing, social work, and/or pharmacy services through [facility] Care Management, requesting to not be contacted further.  All disciplines will make arrangements to close patient's case and place patient on "do not call" list. [name], BSW, MSW, LCSW  Licensed Clinical Social Worker  [facility]  Mailing [place] [address], [place], [place] [address] Physical Address-[address], [place], [place] [address] Toll Free Main # [phone] Fax # [phone] Cell # [phone]  Fax # [phone]  [email or web address] Patient's preferred language:  [place]   English  ATTENTION:  If you speak English, language assistance services, free of charge, are available to you.    Nondiscrimination and Accessibility Statement: Discrimination is Against the [name], a subsidiary of [employer], complies with [employer] civil rights laws and does not discriminate on the basis of race, color, national origin, age, disability, or sex.  [facility] does not exclude people or treat them differently because of race, color, national origin, age, disability, or sex.  [facility] Tourist information [facility] manager Providers will:  . Provide free aids and services to people with disabilities to communicate effectively with [place], such as:     ? Qualified sign language interpreters  ? Written information in other formats (large print, audio, accessible electronic formats, other formats)   . Provide free language services to people whose primary language is not [place], such as:    ? Qualified interpreters    ? Information  written in other languages   If you need these services, contact your [facility].  If you believe that a [facility] has failed to provide these services or discriminated in another way on the basis of race, color, national origin, age, disability, or sex, you can file a Theatre manager with: [facility]'s Anonymous Reporting Helpline, [phone] or [email or web address].fr.  You can file a grievance in person or by mail, fax, or email. If you need help filing a grievance, you may contact Hendricks Milo, Interim Compliance Officer, [facility] Department of Compliance and Integrity, [address], [place], [place]. [address], [phone], [name].    You can also file a civil rights complaint with the U.S. Department of Health and Health and safety inspector, Office for [employer], electronically through the Office for Civil Rights Complaint Portal, available at https://mendez-ellison.com/.jsf, or by mail or phone at:  U.S. Department of Health and Human Services [address] [place] Room [phone], [facility] Building [place], [place]. [address]  [phone], [phone] (TDD) Complaint forms are available at [email or web address].

## 2016-05-07 ENCOUNTER — Ambulatory Visit: Payer: Medicare Other | Admitting: *Deleted

## 2016-05-10 ENCOUNTER — Ambulatory Visit (HOSPITAL_COMMUNITY)
Admission: RE | Admit: 2016-05-10 | Discharge: 2016-05-10 | Disposition: A | Discharge status: Home / Self Care | Payer: Medicare Other | Source: Ambulatory Visit | Attending: Cardiology | Admitting: Cardiology

## 2016-05-10 ENCOUNTER — Ambulatory Visit (INDEPENDENT_AMBULATORY_CARE_PROVIDER_SITE_OTHER): Payer: Medicare Other | Admitting: Cardiology

## 2016-05-10 VITALS — BP 116/80 | HR 64 | Wt 195.8 lb

## 2016-05-10 DIAGNOSIS — F0390 Unspecified dementia without behavioral disturbance: Secondary | ICD-10-CM | POA: Insufficient documentation

## 2016-05-10 DIAGNOSIS — I739 Peripheral vascular disease, unspecified: Secondary | ICD-10-CM | POA: Diagnosis not present

## 2016-05-10 DIAGNOSIS — I480 Paroxysmal atrial fibrillation: Secondary | ICD-10-CM | POA: Diagnosis not present

## 2016-05-10 DIAGNOSIS — I429 Cardiomyopathy, unspecified: Secondary | ICD-10-CM | POA: Diagnosis not present

## 2016-05-10 DIAGNOSIS — Z79899 Other long term (current) drug therapy: Secondary | ICD-10-CM | POA: Diagnosis not present

## 2016-05-10 DIAGNOSIS — N183 Chronic kidney disease, stage 3 unspecified: Secondary | ICD-10-CM

## 2016-05-10 DIAGNOSIS — I129 Hypertensive chronic kidney disease with stage 1 through stage 4 chronic kidney disease, or unspecified chronic kidney disease: Secondary | ICD-10-CM | POA: Diagnosis not present

## 2016-05-10 DIAGNOSIS — Z7901 Long term (current) use of anticoagulants: Secondary | ICD-10-CM | POA: Diagnosis not present

## 2016-05-10 DIAGNOSIS — N400 Enlarged prostate without lower urinary tract symptoms: Secondary | ICD-10-CM | POA: Diagnosis not present

## 2016-05-10 DIAGNOSIS — I6350 Cerebral infarction due to unspecified occlusion or stenosis of unspecified cerebral artery: Secondary | ICD-10-CM

## 2016-05-10 DIAGNOSIS — I5022 Chronic systolic (congestive) heart failure: Secondary | ICD-10-CM | POA: Insufficient documentation

## 2016-05-10 DIAGNOSIS — D649 Anemia, unspecified: Secondary | ICD-10-CM | POA: Diagnosis not present

## 2016-05-10 DIAGNOSIS — I2510 Atherosclerotic heart disease of native coronary artery without angina pectoris: Secondary | ICD-10-CM | POA: Insufficient documentation

## 2016-05-10 DIAGNOSIS — M109 Gout, unspecified: Secondary | ICD-10-CM | POA: Diagnosis not present

## 2016-05-10 DIAGNOSIS — E039 Hypothyroidism, unspecified: Secondary | ICD-10-CM | POA: Diagnosis not present

## 2016-05-10 DIAGNOSIS — E870 Hyperosmolality and hypernatremia: Secondary | ICD-10-CM | POA: Diagnosis not present

## 2016-05-10 DIAGNOSIS — Z8673 Personal history of transient ischemic attack (TIA), and cerebral infarction without residual deficits: Secondary | ICD-10-CM | POA: Insufficient documentation

## 2016-05-10 DIAGNOSIS — I130 Hypertensive heart and chronic kidney disease with heart failure and stage 1 through stage 4 chronic kidney disease, or unspecified chronic kidney disease: Secondary | ICD-10-CM | POA: Diagnosis not present

## 2016-05-10 DIAGNOSIS — E785 Hyperlipidemia, unspecified: Secondary | ICD-10-CM | POA: Diagnosis not present

## 2016-05-10 DIAGNOSIS — Z5181 Encounter for therapeutic drug level monitoring: Secondary | ICD-10-CM

## 2016-05-10 LAB — COMPREHENSIVE METABOLIC PANEL
ALBUMIN: 3.2 g/dL — AB (ref 3.5–5.0)
ALT: 31 U/L (ref 17–63)
AST: 19 U/L (ref 15–41)
Alkaline Phosphatase: 145 U/L — ABNORMAL HIGH (ref 38–126)
Anion gap: 8 (ref 5–15)
BUN: 26 mg/dL — AB (ref 6–20)
CHLORIDE: 109 mmol/L (ref 101–111)
CO2: 27 mmol/L (ref 22–32)
CREATININE: 2.33 mg/dL — AB (ref 0.61–1.24)
Calcium: 9.3 mg/dL (ref 8.9–10.3)
GFR calc Af Amer: 30 mL/min — ABNORMAL LOW (ref >60)
GFR calc non Af Amer: 26 mL/min — ABNORMAL LOW (ref >60)
GLUCOSE: 120 mg/dL — AB (ref 65–99)
Potassium: 3.6 mmol/L (ref 3.5–5.1)
Sodium: 144 mmol/L (ref 135–145)
Total Bilirubin: 1 mg/dL (ref 0.3–1.2)
Total Protein: 5.7 g/dL — ABNORMAL LOW (ref 6.5–8.1)

## 2016-05-10 LAB — POCT INR: INR: 4

## 2016-05-10 LAB — BRAIN NATRIURETIC PEPTIDE: B Natriuretic Peptide: 1065.6 pg/mL — ABNORMAL HIGH (ref 0.0–100.0)

## 2016-05-10 MED ORDER — TORSEMIDE 20 MG PO TABS: 40.0000 mg | ORAL_TABLET | Freq: Two times a day (BID) | ORAL | Status: DC

## 2016-05-10 MED ORDER — HYDRALAZINE HCL 50 MG PO TABS: 50.0000 mg | ORAL_TABLET | Freq: Three times a day (TID) | ORAL | Status: DC

## 2016-05-10 MED ORDER — SPIRONOLACTONE 25 MG PO TABS: 25.0000 mg | ORAL_TABLET | Freq: Every day | ORAL | Status: DC

## 2016-05-10 NOTE — Progress Notes (Signed)
Advanced Heart Failure Medication Review by a Pharmacist  Does the patient  feel that his/her medications are working for him/her?  yes  Has the patient been experiencing any side effects to the medications prescribed?  no  Does the patient measure his/her own blood pressure or blood glucose at home?  no   Does the patient have any problems obtaining medications due to transportation or finances?   no  Understanding of regimen: fair Understanding of indications: fair Potential of compliance: good Patient understands to avoid NSAIDs. Patient understands to avoid decongestants.  Issues to address at subsequent visits: Compliance/understanding    Pharmacist comments:  Frank Mclean is a 75 yo M presenting with his wife and without a medication list. He has a HH nurse, Frank Mclean, who fills his pillbox and manages his medications for him. His wife states that there was some confusion at the pharmacy regarding his most recent K supplement change so will call and clarify. His wife also asked about his glipizide that he had been on for his DMII and whether or not he needs to be back on it. It looks like it was discontinued during his hospitalization in March for hypoglycemic episodes and Dr. Loren Mclean last note does not mention restarting it. Have asked them to contact Dr. Loren Mclean office to inquire about restarting. Also noted that patient is not taking Namzaric as recently prescribed by Dr. Posey Mclean because wife states that his memory has improved without it. Will update [facility] nurse about any changes made today.   Frank Mclean. Frank Mclean, PharmD, BCPS, CPP Clinical Pharmacist Pager: [phone] Phone: [phone] 05/10/2016 9:48 AM      Time with patient: 10 minutes Preparation and documentation time: 6 minutes Total time: 16 minutes

## 2016-05-10 NOTE — Progress Notes (Signed)
Patient ID: Frank Mclean, male   DOB: 05-12-1941, 75 y.o.   MRN: [id number]  PCP:  [name], MD  Electrophysiologist:  Dr. Lewayne Bunting  HF Cardiology: Dr Shirlee Latch   History of Present Illness: Frank Mclean is a 75 y.o. 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   He was seen in the ER on 06/09/15 with dyspnea and LE edema at which time Hgb was 8.6 (runs 8-10 range), Cr 1.85 (most recently 1.93), BNP 384. CXR had shown small left pleural effusion and COPD. He was noted to have wheezing and rhonchi. He was treated with IV Lasix.    In 9/16, LV epicardial lead was placed.    He was admitted in 3/17 with hypernatremia, TUI, and worsening dementia.  He was seen by Tereso Newcomer in 4/17 and was noted to be in atrial fibrillation with increased weight.  He was admitted and diuresed.  He was cardioverted to NSR.  Last echo in 4/17 showed EF 20-25%.   He is now back at home.  He is wearing oxygen at night.  Weight is up about 2 lbs compared to last appointment.  He can walk to the mailbox without dyspnea, gets short of breath after about 200-300 feet.  Dyspnea when he "hurries."  No orthopnea/PND.  No chest pain. No lightheadedness.    Optivol was checked today: fluid index remains > threshold but impedance is trending up.  He has been in NSR since 4/17.   ECG: A-BiV sequential pacing     Labs (7/15): LDL 186 Labs (2/16): K 4.9, creatinine 2.91 => 2.62, proBNP 432, HCT 34.2 Labs (7/16): creatinine 2.0, K 4.1 Labs (5/17): K 3.6 =>3.1, Na 150=>148, creatinine 1.97=>2.34, HCT 33.3, TSH normal, BNP 824  PMH: 1. CKD: Followed by Dr Lowell Guitar. 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 He is now off amiodarone.  3. Cardiomyopathy: Thought to be  nonischemic. He had cardiac cath in 4/12 with mild nonobstructive disease. Echo (6/14) with EF 25-30%, prominent apical trabeculations, diffuse hypokinesis, mild MR. Lexiscan Cardiolite in 1/16 showed a large, severe primarily fixed inferior and apical perfusion defect with EF 25%, suggestive of prior infarct with mild peri-infarct ischemia.  RHC (2/16) with mean RA 6, PA 62/32 mean 46, PCWP 30, CI 1.81.  Echo (3/16) with EF 30-35%, diffuse hypokinesis, mildly dilated RV with moderately decreased systolic function, moderate MR, PASP 54 mmHg. Medtronic ICD (3/16), unable to place LV lead.  - 9/16 epicardial lead placed.  - Echo (4/17) with EF 20-25%, moderate LV dilation, moderately dilated RV with mildly decreased systolic function, moderate to severe TR.  4. CVA: 2004.  5. HTN 6. Gout 7. Hyperlipidemia 8. BPH 9. H/o LBBB 10. Atrial fibrillation: Paroxysmal. DCCV in 4/17.  11. PAD: Has been followed by [facility] (Dr Darrick Penna). History of foot ulcers, now healed.  12. Anemia: Suspect related to CKD.  13. Dementia  SH: Married, lives in [place], never smoked, no ETOH.   FH: Father lived into his 32s, no significant cardiac disease.   ROS: All systems reviewed and negative except as per HPI.   Current Outpatient Prescriptions  Medication Sig Dispense Refill  . acetaminophen (TYLENOL) 325 MG tablet Take 1 tablet (325 mg total) by mouth every 6 (six) hours as needed for mild pain (or Fever >/=  101).    . amiodarone (PACERONE) 200 MG tablet Take 200 mg by mouth daily.    [name] atorvastatin (LIPITOR) 20 MG tablet Take 1 tablet (20 mg total) by mouth daily. 90 tablet 3  . carvedilol (COREG) 6.25 MG tablet Take 1 tablet (6.25 mg total) by mouth 2 (two) times daily with a meal. 60 tablet 6  . Cholecalciferol (VITAMIN D-3 PO) Take 1,000 Units by mouth daily.    [name] escitalopram (LEXAPRO) 5 MG tablet TAKE 1 TABLET BY MOUTH EVERY DAY 30 tablet 3  . ferrous sulfate 325 (65 FE) MG tablet Take 1 tablet (325 mg  total) by mouth daily. 30 tablet 11  . hydrALAZINE (APRESOLINE) 50 MG tablet Take 1 tablet (50 mg total) by mouth 3 (three) times daily. 90 tablet 3  . isosorbide mononitrate (IMDUR) 30 MG 24 hr tablet Take 1 tablet (30 mg total) by mouth 2 (two) times daily.    . Menthol, Topical Analgesic, (BIOFREEZE) 4 % GEL Apply topically 2 (two) times daily. Apply to left shoulder and left ankle    . OXYGEN Inhale 2.5 L/min into the lungs at bedtime. Reported on 05/05/2016    . potassium chloride SA (KLOR-CON M20) 20 MEQ tablet Take 2 tablets (40 mEq total) by mouth 2 (two) times daily. 120 tablet 2  . spironolactone (ALDACTONE) 25 MG tablet Take 1 tablet (25 mg total) by mouth daily. 30 tablet 3  . SYMBICORT 160-4.5 MCG/ACT inhaler INHALE 2 PUFFS INTO THE LUNGS 2 TIMES DAILY 10.2 Inhaler 5  . SYNTHROID 100 MCG tablet Take 2 tablets by mouth every morning.  11  . torsemide (DEMADEX) 20 MG tablet Take 2 tablets (40 mg total) by mouth 2 (two) times daily. 120 tablet 2  . triamcinolone cream (KENALOG) 0.5 % Apply topically 2 (two) times daily. 60 g 2  . vitamin B-12 (CYANOCOBALAMIN) 1000 MCG tablet Take 1,000 mcg by mouth daily.    [name] warfarin (COUMADIN) 5 MG tablet TAKE 1 TABLET BY MOUTH DAILY OR AS DIRECTED BY COUMADIN CLINIC. 30 tablet 1   No current facility-administered medications for this encounter.  Review of Systems  Cardiovascular: Positive for dyspnea on exertion.  All other systems reviewed and are negative.    PHYSICAL EXAM: VS:  BP 116/80 mmHg  Pulse 64  Wt 195 lb 12 oz (88.792 kg)  SpO2 98%    Wt Readings from Last 3 Encounters:  05/10/16 195 lb 12 oz (88.792 kg)  04/29/16 191 lb (86.637 kg)  04/28/16 193 lb 8 oz (87.771 kg)     GEN: Well nourished, well developed, in no acute distress. Wife and brother in law present.  HEENT: normal  Neck: JVP 8 cm Cardiac:  Normal S1/S2, RRR; 2/6 HSM LLSB,  no rubs or gallops, 1+ ankle edema.    Respiratory:  Slight crackles at the bases  bilaterally. GI: soft, nontender, nondistended, + BS MS: no deformity or atrophy Skin: warm and dry  Neuro:  CNs II-XII intact, Strength and sensation are intact Psych: Flat affect, not very interactive.    ASSESSMENT AND PLAN: 1.  Chronic systolic CHF: Nonischemic cardiomyopathy. Medtronic CRT-D with epicardial lead. Echo (4/17) with EF 20-25% and moderately decreased RV systolic function. NYHA class III symptoms.  He still has some volume overload on exam and Optivol fluid index remains > threshold.  - Increase torsemide to 40 mg bid.   - Increase spironolactone to 25 mg daily.  - Continue current Coreg. - Stop amlodipine.  -  Increase hydralazine to 50 mg tid, continue Imdur 30 bid.   - BMET/BNP today and repeat in 1 week.  - PACs/PVCs have been lessening his BiV pacing percentage. 2.  PAF:  Maintaining NSR since DCCV. - Continue amiodarone at 200 mg daily.  This was stopped in the past due to hypothyroidism, but he is on thyroid replacement now.  Check LFTs today.  He will need regular followup with an eye doctor while on amiodarone.  - Continue warfarin.  3.  CKD: Creatinine baseline 1.8-2.1. It was a bit higher most recently. BMET today.  4.  PAD:  Followed by [facility].  Marca Ancona 05/10/2016

## 2016-05-10 NOTE — Patient Instructions (Signed)
Increase Torsemide to 40 mg (2 tabs) Twice daily   Increase Spironolactone to 25 mg (1 tab) daily  Increase Hydralazine to 50 mg Three times a day, we have sent you in a new prescription for 50 mg tablets  Labs today  Labs in 1 week  Your physician recommends that you schedule a follow-up appointment in: 4 weeks

## 2016-05-11 DIAGNOSIS — I130 Hypertensive heart and chronic kidney disease with heart failure and stage 1 through stage 4 chronic kidney disease, or unspecified chronic kidney disease: Secondary | ICD-10-CM | POA: Diagnosis not present

## 2016-05-11 DIAGNOSIS — I5022 Chronic systolic (congestive) heart failure: Secondary | ICD-10-CM | POA: Diagnosis not present

## 2016-05-12 ENCOUNTER — Other Ambulatory Visit (HOSPITAL_COMMUNITY): Payer: Self-pay | Admitting: Cardiology

## 2016-05-12 DIAGNOSIS — I509 Heart failure, unspecified: Secondary | ICD-10-CM

## 2016-05-12 MED ORDER — POTASSIUM CHLORIDE CRYS ER 20 MEQ PO TBCR: 80.0000 meq | EXTENDED_RELEASE_TABLET | Freq: Two times a day (BID) | ORAL | Status: DC

## 2016-05-12 MED ORDER — TORSEMIDE 20 MG PO TABS: 20.0000 mg | ORAL_TABLET | Freq: Two times a day (BID) | ORAL | Status: DC

## 2016-05-13 ENCOUNTER — Encounter: Payer: Self-pay | Admitting: Cardiology

## 2016-05-13 ENCOUNTER — Telehealth (HOSPITAL_COMMUNITY): Payer: Self-pay | Admitting: *Deleted

## 2016-05-13 DIAGNOSIS — I5022 Chronic systolic (congestive) heart failure: Secondary | ICD-10-CM | POA: Diagnosis not present

## 2016-05-13 DIAGNOSIS — I130 Hypertensive heart and chronic kidney disease with heart failure and stage 1 through stage 4 chronic kidney disease, or unspecified chronic kidney disease: Secondary | ICD-10-CM | POA: Diagnosis not present

## 2016-05-13 MED ORDER — POTASSIUM CHLORIDE CRYS ER 20 MEQ PO TBCR: 40.0000 meq | EXTENDED_RELEASE_TABLET | Freq: Two times a day (BID) | ORAL | Status: DC

## 2016-05-13 NOTE — Telephone Encounter (Signed)
Pt's HHRN called concerned about pt's meds.  He states he has the information that was given to pt at his appt on Mon stating:  Increase Torsemide to 40 mg Twice daily   Increase Spironolactone to 25 mg daily  Increase Hydralazine 50 mg Three times a day   There is no mention on sheet about KCL dose, pt has been taking 40 meq Twice daily.   However he states pt's wife told him she received a call from our office yesterday telling them to increase KCL to 80 meq Twice daily and he wanted to verify this.  Upon further review of chart, pt had low K on 5/10 and Dr Shirlee Latch wanted KCL increased to 80 meq bid at that time, however we were not able to reach pt and left several messages.  Chantel finally spoke w/pt's wife yesterday and was unaware of appt and repeat labs 5/22 and gave pt the instructions for 5/10.  Discussed all info and old and new lab results w/Dr Shirlee Latch.  He would like pt to continue KCL 40 meq BID and the instructions we gave at appt 5/22.  HHRN is aware and will advise pt and wife.

## 2016-05-17 DIAGNOSIS — I5022 Chronic systolic (congestive) heart failure: Secondary | ICD-10-CM | POA: Diagnosis not present

## 2016-05-17 DIAGNOSIS — I130 Hypertensive heart and chronic kidney disease with heart failure and stage 1 through stage 4 chronic kidney disease, or unspecified chronic kidney disease: Secondary | ICD-10-CM | POA: Diagnosis not present

## 2016-05-18 LAB — POCT INR: INR: 2.5

## 2016-05-20 ENCOUNTER — Other Ambulatory Visit (HOSPITAL_COMMUNITY): Payer: Self-pay | Admitting: Cardiology

## 2016-05-20 ENCOUNTER — Telehealth (HOSPITAL_COMMUNITY): Payer: Self-pay | Admitting: *Deleted

## 2016-05-20 DIAGNOSIS — I5022 Chronic systolic (congestive) heart failure: Secondary | ICD-10-CM | POA: Diagnosis not present

## 2016-05-20 DIAGNOSIS — I130 Hypertensive heart and chronic kidney disease with heart failure and stage 1 through stage 4 chronic kidney disease, or unspecified chronic kidney disease: Secondary | ICD-10-CM | POA: Diagnosis not present

## 2016-05-20 NOTE — Telephone Encounter (Signed)
Buzz, HHRN called to report pt has had significant weight loss.  On 5/22 wt was 190, 5/29 180 and yesterday and today was 175.  He states pt is feeling fine, no dizziness or lightheadedness, pt's VS are stable.  He reports pt has been taking Tor 40 mg in AM and 20 mg in PM, although our chart says 40 BID.  Pt is sch for labs tomorrow to check renal functions, we will await those results, he request we please call him for any/all med changes.  Will also send to Dr Shirlee Latch for review

## 2016-05-21 ENCOUNTER — Ambulatory Visit (HOSPITAL_COMMUNITY)
Admission: RE | Admit: 2016-05-21 | Discharge: 2016-05-21 | Disposition: A | Discharge status: Home / Self Care | Payer: Medicare Other | Source: Ambulatory Visit | Attending: Internal Medicine | Admitting: Internal Medicine

## 2016-05-21 DIAGNOSIS — I5043 Acute on chronic combined systolic (congestive) and diastolic (congestive) heart failure: Secondary | ICD-10-CM

## 2016-05-21 LAB — BASIC METABOLIC PANEL
Anion gap: 7 (ref 5–15)
BUN: 33 mg/dL — ABNORMAL HIGH (ref 6–20)
CHLORIDE: 113 mmol/L — AB (ref 101–111)
CO2: 26 mmol/L (ref 22–32)
CREATININE: 2.84 mg/dL — AB (ref 0.61–1.24)
Calcium: 9.5 mg/dL (ref 8.9–10.3)
GFR calc non Af Amer: 20 mL/min — ABNORMAL LOW (ref >60)
GFR, EST AFRICAN AMERICAN: 23 mL/min — AB (ref >60)
Glucose, Bld: 107 mg/dL — ABNORMAL HIGH (ref 65–99)
Potassium: 4.3 mmol/L (ref 3.5–5.1)
Sodium: 146 mmol/L — ABNORMAL HIGH (ref 135–145)

## 2016-05-21 LAB — BRAIN NATRIURETIC PEPTIDE: B Natriuretic Peptide: 369.2 pg/mL — ABNORMAL HIGH (ref 0.0–100.0)

## 2016-05-24 ENCOUNTER — Ambulatory Visit (INDEPENDENT_AMBULATORY_CARE_PROVIDER_SITE_OTHER): Payer: Medicare Other | Admitting: Cardiovascular Disease

## 2016-05-24 DIAGNOSIS — I6350 Cerebral infarction due to unspecified occlusion or stenosis of unspecified cerebral artery: Secondary | ICD-10-CM

## 2016-05-24 DIAGNOSIS — Z5181 Encounter for therapeutic drug level monitoring: Secondary | ICD-10-CM

## 2016-05-24 DIAGNOSIS — I5022 Chronic systolic (congestive) heart failure: Secondary | ICD-10-CM | POA: Diagnosis not present

## 2016-05-24 DIAGNOSIS — I130 Hypertensive heart and chronic kidney disease with heart failure and stage 1 through stage 4 chronic kidney disease, or unspecified chronic kidney disease: Secondary | ICD-10-CM | POA: Diagnosis not present

## 2016-05-26 ENCOUNTER — Telehealth: Payer: Self-pay | Admitting: Cardiology

## 2016-05-26 ENCOUNTER — Ambulatory Visit (INDEPENDENT_AMBULATORY_CARE_PROVIDER_SITE_OTHER): Payer: Medicare Other | Admitting: *Deleted

## 2016-05-26 ENCOUNTER — Encounter: Payer: Self-pay | Admitting: Cardiology

## 2016-05-26 DIAGNOSIS — I429 Cardiomyopathy, unspecified: Secondary | ICD-10-CM | POA: Diagnosis not present

## 2016-05-26 DIAGNOSIS — I428 Other cardiomyopathies: Secondary | ICD-10-CM

## 2016-05-26 NOTE — Telephone Encounter (Signed)
Confirmed remote transmission w/ pt wife.   

## 2016-05-26 NOTE — Progress Notes (Signed)
Remote ICD transmission.   

## 2016-05-27 ENCOUNTER — Other Ambulatory Visit: Payer: Self-pay | Admitting: Internal Medicine

## 2016-05-31 ENCOUNTER — Ambulatory Visit (INDEPENDENT_AMBULATORY_CARE_PROVIDER_SITE_OTHER): Payer: Medicare Other | Admitting: Cardiology

## 2016-05-31 DIAGNOSIS — I6350 Cerebral infarction due to unspecified occlusion or stenosis of unspecified cerebral artery: Secondary | ICD-10-CM

## 2016-05-31 DIAGNOSIS — I130 Hypertensive heart and chronic kidney disease with heart failure and stage 1 through stage 4 chronic kidney disease, or unspecified chronic kidney disease: Secondary | ICD-10-CM | POA: Diagnosis not present

## 2016-05-31 DIAGNOSIS — Z5181 Encounter for therapeutic drug level monitoring: Secondary | ICD-10-CM

## 2016-05-31 DIAGNOSIS — I5022 Chronic systolic (congestive) heart failure: Secondary | ICD-10-CM | POA: Diagnosis not present

## 2016-05-31 LAB — POCT INR: INR: 2.3

## 2016-06-02 DIAGNOSIS — I5022 Chronic systolic (congestive) heart failure: Secondary | ICD-10-CM | POA: Diagnosis not present

## 2016-06-02 DIAGNOSIS — N183 Chronic kidney disease, stage 3 (moderate): Secondary | ICD-10-CM | POA: Diagnosis not present

## 2016-06-02 DIAGNOSIS — E1122 Type 2 diabetes mellitus with diabetic chronic kidney disease: Secondary | ICD-10-CM | POA: Diagnosis not present

## 2016-06-02 DIAGNOSIS — I130 Hypertensive heart and chronic kidney disease with heart failure and stage 1 through stage 4 chronic kidney disease, or unspecified chronic kidney disease: Secondary | ICD-10-CM | POA: Diagnosis not present

## 2016-06-03 LAB — CUP PACEART REMOTE DEVICE CHECK
Brady Statistic AP VS Percent: 2.29 %
Brady Statistic AS VP Percent: 2.32 %
Brady Statistic AS VS Percent: 1.28 %
Brady Statistic RA Percent Paced: 96.4 %
Date Time Interrogation Session: 20170607163322
HIGH POWER IMPEDANCE MEASURED VALUE: 61 Ohm
Implantable Lead Implant Date: 20160323
Implantable Lead Model: 5076
Lead Channel Impedance Value: 304 Ohm
Lead Channel Impedance Value: 361 Ohm
Lead Channel Impedance Value: 4047 Ohm
Lead Channel Impedance Value: 418 Ohm
Lead Channel Pacing Threshold Amplitude: 0.625 V
Lead Channel Pacing Threshold Amplitude: 1.25 V
Lead Channel Pacing Threshold Pulse Width: 0.4 ms
Lead Channel Pacing Threshold Pulse Width: 0.4 ms
Lead Channel Sensing Intrinsic Amplitude: 11.375 mV
Lead Channel Setting Pacing Amplitude: 1.5 V
Lead Channel Setting Pacing Pulse Width: 0.4 ms
MDC IDC LEAD IMPLANT DT: 20160323
MDC IDC LEAD IMPLANT DT: 20160323
MDC IDC LEAD LOCATION: 753858
MDC IDC LEAD LOCATION: 753859
MDC IDC LEAD LOCATION: 753860
MDC IDC LEAD MODEL: 6935
MDC IDC MSMT BATTERY REMAINING LONGEVITY: 101 mo
MDC IDC MSMT BATTERY VOLTAGE: 2.99 V
MDC IDC MSMT LEADCHNL LV IMPEDANCE VALUE: 304 Ohm
MDC IDC MSMT LEADCHNL LV IMPEDANCE VALUE: 4047 Ohm
MDC IDC MSMT LEADCHNL RA PACING THRESHOLD PULSEWIDTH: 0.4 ms
MDC IDC MSMT LEADCHNL RA SENSING INTR AMPL: 1.75 mV
MDC IDC MSMT LEADCHNL RA SENSING INTR AMPL: 1.75 mV
MDC IDC MSMT LEADCHNL RV PACING THRESHOLD AMPLITUDE: 0.625 V
MDC IDC MSMT LEADCHNL RV SENSING INTR AMPL: 11.375 mV
MDC IDC SET LEADCHNL LV PACING AMPLITUDE: 2.25 V
MDC IDC SET LEADCHNL RV SENSING SENSITIVITY: 0.3 mV
MDC IDC STAT BRADY AP VP PERCENT: 94.1 %
MDC IDC STAT BRADY RV PERCENT PACED: 17.76 %

## 2016-06-09 ENCOUNTER — Encounter: Payer: Self-pay | Admitting: Cardiology

## 2016-06-11 ENCOUNTER — Encounter (HOSPITAL_COMMUNITY): Payer: Medicare Other

## 2016-06-15 ENCOUNTER — Ambulatory Visit (INDEPENDENT_AMBULATORY_CARE_PROVIDER_SITE_OTHER): Payer: Medicare Other | Admitting: Pharmacist

## 2016-06-15 DIAGNOSIS — I6350 Cerebral infarction due to unspecified occlusion or stenosis of unspecified cerebral artery: Secondary | ICD-10-CM

## 2016-06-15 DIAGNOSIS — Z7901 Long term (current) use of anticoagulants: Secondary | ICD-10-CM

## 2016-06-15 DIAGNOSIS — Z5181 Encounter for therapeutic drug level monitoring: Secondary | ICD-10-CM | POA: Diagnosis not present

## 2016-06-15 LAB — POCT INR: INR: 2

## 2016-06-24 ENCOUNTER — Encounter: Payer: Self-pay | Admitting: Cardiology

## 2016-07-06 ENCOUNTER — Ambulatory Visit (INDEPENDENT_AMBULATORY_CARE_PROVIDER_SITE_OTHER): Payer: Medicare Other | Admitting: *Deleted

## 2016-07-06 ENCOUNTER — Ambulatory Visit (HOSPITAL_COMMUNITY)
Admission: RE | Admit: 2016-07-06 | Discharge: 2016-07-06 | Disposition: A | Discharge status: Home / Self Care | Payer: Medicare Other | Source: Ambulatory Visit | Attending: Cardiology | Admitting: Cardiology

## 2016-07-06 VITALS — BP 136/88 | HR 69 | Wt 196.0 lb

## 2016-07-06 DIAGNOSIS — R0602 Shortness of breath: Secondary | ICD-10-CM | POA: Diagnosis not present

## 2016-07-06 DIAGNOSIS — Z7901 Long term (current) use of anticoagulants: Secondary | ICD-10-CM | POA: Insufficient documentation

## 2016-07-06 DIAGNOSIS — Z872 Personal history of diseases of the skin and subcutaneous tissue: Secondary | ICD-10-CM | POA: Diagnosis not present

## 2016-07-06 DIAGNOSIS — R600 Localized edema: Secondary | ICD-10-CM | POA: Diagnosis not present

## 2016-07-06 DIAGNOSIS — N183 Chronic kidney disease, stage 3 unspecified: Secondary | ICD-10-CM

## 2016-07-06 DIAGNOSIS — I130 Hypertensive heart and chronic kidney disease with heart failure and stage 1 through stage 4 chronic kidney disease, or unspecified chronic kidney disease: Secondary | ICD-10-CM | POA: Diagnosis not present

## 2016-07-06 DIAGNOSIS — I2510 Atherosclerotic heart disease of native coronary artery without angina pectoris: Secondary | ICD-10-CM | POA: Diagnosis not present

## 2016-07-06 DIAGNOSIS — D649 Anemia, unspecified: Secondary | ICD-10-CM | POA: Diagnosis not present

## 2016-07-06 DIAGNOSIS — J449 Chronic obstructive pulmonary disease, unspecified: Secondary | ICD-10-CM | POA: Diagnosis not present

## 2016-07-06 DIAGNOSIS — Z5181 Encounter for therapeutic drug level monitoring: Secondary | ICD-10-CM | POA: Diagnosis not present

## 2016-07-06 DIAGNOSIS — I429 Cardiomyopathy, unspecified: Secondary | ICD-10-CM | POA: Diagnosis not present

## 2016-07-06 DIAGNOSIS — I7025 Atherosclerosis of native arteries of other extremities with ulceration: Secondary | ICD-10-CM | POA: Insufficient documentation

## 2016-07-06 DIAGNOSIS — I480 Paroxysmal atrial fibrillation: Secondary | ICD-10-CM | POA: Diagnosis not present

## 2016-07-06 DIAGNOSIS — I6350 Cerebral infarction due to unspecified occlusion or stenosis of unspecified cerebral artery: Secondary | ICD-10-CM | POA: Diagnosis not present

## 2016-07-06 DIAGNOSIS — I447 Left bundle-branch block, unspecified: Secondary | ICD-10-CM | POA: Diagnosis not present

## 2016-07-06 DIAGNOSIS — G629 Polyneuropathy, unspecified: Secondary | ICD-10-CM | POA: Diagnosis not present

## 2016-07-06 DIAGNOSIS — E785 Hyperlipidemia, unspecified: Secondary | ICD-10-CM | POA: Insufficient documentation

## 2016-07-06 DIAGNOSIS — Z95810 Presence of automatic (implantable) cardiac defibrillator: Secondary | ICD-10-CM | POA: Diagnosis not present

## 2016-07-06 DIAGNOSIS — E039 Hypothyroidism, unspecified: Secondary | ICD-10-CM | POA: Diagnosis not present

## 2016-07-06 DIAGNOSIS — I5022 Chronic systolic (congestive) heart failure: Secondary | ICD-10-CM

## 2016-07-06 DIAGNOSIS — Z8673 Personal history of transient ischemic attack (TIA), and cerebral infarction without residual deficits: Secondary | ICD-10-CM | POA: Diagnosis not present

## 2016-07-06 DIAGNOSIS — N400 Enlarged prostate without lower urinary tract symptoms: Secondary | ICD-10-CM | POA: Diagnosis not present

## 2016-07-06 DIAGNOSIS — M109 Gout, unspecified: Secondary | ICD-10-CM | POA: Insufficient documentation

## 2016-07-06 DIAGNOSIS — F0390 Unspecified dementia without behavioral disturbance: Secondary | ICD-10-CM | POA: Insufficient documentation

## 2016-07-06 LAB — POCT INR: INR: 1.5

## 2016-07-06 LAB — BRAIN NATRIURETIC PEPTIDE: B NATRIURETIC PEPTIDE 5: 870.8 pg/mL — AB (ref 0.0–100.0)

## 2016-07-06 MED ORDER — CARVEDILOL 6.25 MG PO TABS: 9.3750 mg | ORAL_TABLET | Freq: Two times a day (BID) | ORAL | Status: DC

## 2016-07-06 NOTE — Patient Instructions (Signed)
Increase Carvedilol to 9.375 mg (1 & 1/2 tabs) Twice daily   Labs today  Your physician recommends that you schedule a follow-up appointment in: 2 months  

## 2016-07-06 NOTE — Progress Notes (Signed)
Patient ID: Frank Mclean, male   DOB: 03/16/1941, 75 y.o.   MRN: [id number]    Advanced Heart Failure Clinic Note   PCP:  [name], MD  Electrophysiologist:  Dr. Lewayne Bunting  HF Cardiology: Dr Shirlee Latch   History of Present Illness: Frank Mclean is a 75 y.o. 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   He was seen in the ER on 06/09/15 with dyspnea and LE edema at which time Hgb was 8.6 (runs 8-10 range), Cr 1.85 (most recently 1.93), BNP 384. CXR had shown small left pleural effusion and COPD. He was noted to have wheezing and rhonchi. He was treated with IV Lasix.    In 9/16, LV epicardial lead was placed.    He was admitted in 3/17 with hypernatremia, TUI, and worsening dementia.  He was seen by Tereso Newcomer in 4/17 and was noted to be in atrial fibrillation with increased weight.  He was admitted and diuresed.  He was cardioverted to NSR.  Last echo in 4/17 showed EF 20-25%.   He presents today for regular follow up. At last visit spironolactone, torsemide, and hydralazine increased. Has been feeling great since last visit.  Is not in any pain. Can walk to mailbox  without DOE.  Gets DOE after about 200-300 feet still or quicker when he "hurries". Continues to wear oxygen at night.  Weight is stable.  Denies orthopnea/PND.  Denies chest pain, dizziness, or lightheadedness.   Optivol interrogated: Fluid well below threshold with no recent crossings. No AT/AF.  Pt activity ~ 1 hr a day.  He has been in NSR since 4/17. >95% BiV pacing.   Labs (7/15): LDL 186 Labs (2/16): K 4.9, creatinine 2.91 => 2.62, proBNP 432, HCT 34.2 Labs (7/16): creatinine 2.0, K 4.1 Labs (5/17): K 3.6 =>3.1, Na 150=>148, creatinine 1.97=>2.34, HCT 33.3, TSH normal, BNP  824 Labs (6/17): BNP 369, K 4.3, creatinine 2.84  PMH: 1. CKD: Followed by Dr Lowell Guitar. 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 He is now off amiodarone.  3. 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 He had cardiac cath in 4/12 with mild nonobstructive disease. Echo (6/14) with EF 25-30%, prominent apical trabeculations, diffuse hypokinesis, mild MR. Lexiscan Cardiolite in 1/16 showed a large, severe primarily fixed inferior and apical perfusion defect with EF 25%, suggestive of prior infarct with mild peri-infarct ischemia.  RHC (2/16) with mean RA 6, PA 62/32 mean 46, PCWP 30, CI 1.81.  Echo (3/16) with EF 30-35%, diffuse hypokinesis, mildly dilated RV with moderately decreased systolic function, moderate MR, PASP 54 mmHg. Medtronic ICD (3/16), unable to place LV lead.  - 9/16 epicardial lead placed.  - Echo (4/17) with EF 20-25%, moderate LV dilation, moderately dilated RV with mildly decreased systolic function, moderate to severe TR.  4. CVA: 2004.  5. HTN 6. Gout 7. Hyperlipidemia 8. BPH 9. H/o LBBB 10. Atrial fibrillation: Paroxysmal. DCCV in 4/17.  11. PAD: Has been followed by [facility] (Dr Darrick Penna). History of foot ulcers, now healed.  12. Anemia: Suspect related to CKD.  13. Dementia  SH: Married, lives in [place], never smoked, no ETOH.   FH: Father lived into his 20s, no significant cardiac disease.   ROS: All systems reviewed and negative except as per HPI.   Current Outpatient Prescriptions  Medication Sig Dispense Refill  . acetaminophen (TYLENOL) 325 MG tablet Take 1 tablet (325 mg total) by mouth every 6 (six) hours as needed for mild pain (or Fever >/= 101).    [name] amiodarone (PACERONE) 200 MG tablet Take 200 mg by mouth daily.    [name] atorvastatin (LIPITOR) 20 MG tablet Take 1 tablet (20 mg total) by mouth daily. 90 tablet 3  . carvedilol (COREG) 6.25 MG tablet TAKE 1 TABLET (6.25 MG TOTAL) BY MOUTH 2 (TWO) TIMES DAILY WITH A MEAL. 60 tablet 6  .  Cholecalciferol (VITAMIN D-3 PO) Take 1,000 Units by mouth daily.    [name] escitalopram (LEXAPRO) 5 MG tablet TAKE 1 TABLET BY MOUTH EVERY DAY 90 tablet 3  . ferrous sulfate 325 (65 FE) MG tablet Take 1 tablet (325 mg total) by mouth daily. 30 tablet 11  . hydrALAZINE (APRESOLINE) 50 MG tablet Take 1 tablet (50 mg total) by mouth 3 (three) times daily. 90 tablet 3  . isosorbide mononitrate (IMDUR) 30 MG 24 hr tablet Take 1 tablet (30 mg total) by mouth 2 (two) times daily.    . Menthol, Topical Analgesic, (BIOFREEZE) 4 % GEL Apply topically 2 (two) times daily. Apply to left shoulder and left ankle    . OXYGEN Inhale 2.5 L/min into the lungs at bedtime. Reported on 05/05/2016    . potassium chloride SA (KLOR-CON M20) 20 MEQ tablet Take 2 tablets (40 mEq total) by mouth 2 (two) times daily. 240 tablet 2  . spironolactone (ALDACTONE) 25 MG tablet Take 1 tablet (25 mg total) by mouth daily. 30 tablet 3  . SYMBICORT 160-4.5 MCG/ACT inhaler INHALE 2 PUFFS INTO THE LUNGS 2 TIMES DAILY 10.2 Inhaler 5  . SYNTHROID 100 MCG tablet Take 2 tablets by mouth every morning.  11  . torsemide (DEMADEX) 20 MG tablet Take 1-2 tablets (20-40 mg total) by mouth 2 (two) times daily. Take 40 mg in the AM and 20 mg in the PM 120 tablet 2  . triamcinolone cream (KENALOG) 0.5 % Apply topically 2 (two) times daily. 60 g 2  . vitamin B-12 (CYANOCOBALAMIN) 1000 MCG tablet Take 1,000 mcg by mouth daily.    [name] warfarin (COUMADIN) 5 MG tablet TAKE 1 TABLET BY MOUTH DAILY OR AS DIRECTED BY COUMADIN CLINIC. 30 tablet 1   No current facility-administered medications for this encounter.  Review of Systems  Cardiovascular: Positive for dyspnea on exertion.  All other systems reviewed and are negative.    PHYSICAL EXAM: VS:  BP 136/88 mmHg  Pulse 69  Wt 196 lb (88.905 kg)  SpO2 95%    Wt Readings from Last 3 Encounters:  07/06/16 196 lb (88.905 kg)  05/10/16 195 lb 12 oz (88.792 kg)  04/29/16 191 lb (86.637 kg)     GEN:  Elderly appearing, NAD. Wife present HEENT: normal  Neck: JVP 7 cm Cardiac:  Normal S1/S2, RRR; 2/6 HSM LLSB,  no rubs or gallops, Trace ankle edema.    Respiratory:  Slight crackles at the bases bilaterally. GI: soft, nontender, nondistended, + BS MS: no deformity or atrophy Skin: warm and dry  Neuro:  CNs II-XII intact, Strength and sensation are intact Psych: Flat affect, not very interactive.    ASSESSMENT AND PLAN: 1.  Chronic systolic CHF: Nonischemic cardiomyopathy. Medtronic CRT-D with epicardial lead. Echo (4/17) with EF 20-25% and moderately decreased RV systolic function. NYHA class III symptoms.  Volume status stable on exam and optivol. - Continue torsemide 40/20 mg bid.  Can take extra 20 mg as needed in evening. - Continue spironolactone 25 mg daily.  - Increase Coreg to 9.375 mg BID.  - Continue hydralazine 50 mg tid, continue Imdur 30 bid.   - BMET/BNP today.  2.  PAF:  Maintaining NSR since DCCV. - Continue amiodarone at 200 mg daily.  This was stopped in the past due to hypothyroidism, but he is on thyroid replacement now.  LFTs and TSH stable 04/2016.   He will need regular followup with an eye doctor while on amiodarone.  - Continue warfarin. Follows at coumadin clinic. 3.  CKD: Creatinine baseline 1.8-2.1. It was a bit higher most recently. BMET today.  4.  PAD:  Followed by [facility].  Med changes as above. BMET, BNP today.  Will follow up in 2 months.   Graciella Freer PA-C 07/06/2016   Patient seen with PA, agree with the above note.  Volume status seems good, overall seems improved.  Need to get BMET. Increase Coreg to 9.375 mg bid.  Followup in 2 months.   Musa Rewerts 07/07/2016 2:54 PM

## 2016-07-07 ENCOUNTER — Telehealth (HOSPITAL_COMMUNITY): Payer: Self-pay | Admitting: *Deleted

## 2016-07-07 NOTE — Telephone Encounter (Signed)
Notes Recorded by Modesta Messing, CMA on 07/07/2016 at 4:37 PM Spoke with pts wife she is aware of lab results and lab appointment. Added to lab schedule 7/21 Notes Recorded by Laurey Morale, MD on 07/06/2016 at 10:57 PM Needs to return for BMET. Notes Recorded by Noralee Space, RN on 07/06/2016 at 4:57 PM bmet could not be run, per lab it was contaminated. Labs reviewed by RN, will forward to MD for review

## 2016-07-09 ENCOUNTER — Other Ambulatory Visit (HOSPITAL_COMMUNITY): Payer: Medicare Other

## 2016-07-14 ENCOUNTER — Encounter: Payer: Self-pay | Admitting: Podiatry

## 2016-07-14 ENCOUNTER — Ambulatory Visit (INDEPENDENT_AMBULATORY_CARE_PROVIDER_SITE_OTHER): Payer: Medicare Other | Admitting: Podiatry

## 2016-07-14 DIAGNOSIS — B351 Tinea unguium: Secondary | ICD-10-CM

## 2016-07-14 DIAGNOSIS — M79676 Pain in unspecified toe(s): Secondary | ICD-10-CM

## 2016-07-14 NOTE — Patient Instructions (Signed)
Diabetes and Foot Care Diabetes may cause you to have problems because of poor blood supply (circulation) to your feet and legs. This may cause the skin on your feet to become thinner, break easier, and heal more slowly. Your skin may become dry, and the skin may peel and crack. You may also have nerve damage in your legs and feet causing decreased feeling in them. You may not notice minor injuries to your feet that could lead to infections or more serious problems. Taking care of your feet is one of the most important things you can do for yourself.  HOME CARE INSTRUCTIONS  Wear shoes at all times, even in the house. Do not go barefoot. Bare feet are easily injured.  Check your feet daily for blisters, cuts, and redness. If you cannot see the bottom of your feet, use a mirror or ask someone for help.  Wash your feet with warm water (do not use hot water) and mild soap. Then pat your feet and the areas between your toes until they are completely dry. Do not soak your feet as this can dry your skin.  Apply a moisturizing lotion or petroleum jelly (that does not contain alcohol and is unscented) to the skin on your feet and to dry, brittle toenails. Do not apply lotion between your toes.  Trim your toenails straight across. Do not dig under them or around the cuticle. File the edges of your nails with an emery board or nail file.  Do not cut corns or calluses or try to remove them with medicine.  Wear clean socks or stockings every day. Make sure they are not too tight. Do not wear knee-high stockings since they may decrease blood flow to your legs.  Wear shoes that fit properly and have enough cushioning. To break in new shoes, wear them for just a few hours a day. This prevents you from injuring your feet. Always look in your shoes before you put them on to be sure there are no objects inside.  Do not cross your legs. This may decrease the blood flow to your feet.  If you find a minor scrape,  cut, or break in the skin on your feet, keep it and the skin around it clean and dry. These areas may be cleansed with mild soap and water. Do not cleanse the area with peroxide, alcohol, or iodine.  When you remove an adhesive bandage, be sure not to damage the skin around it.  If you have a wound, look at it several times a day to make sure it is healing.  Do not use heating pads or hot water bottles. They may burn your skin. If you have lost feeling in your feet or legs, you may not know it is happening until it is too late.  Make sure your health care provider performs a complete foot exam at least annually or more often if you have foot problems. Report any cuts, sores, or bruises to your health care provider immediately. SEEK MEDICAL CARE IF:   You have an injury that is not healing.  You have cuts or breaks in the skin.  You have an ingrown nail.  You notice redness on your legs or feet.  You feel burning or tingling in your legs or feet.  You have pain or cramps in your legs and feet.  Your legs or feet are numb.  Your feet always feel cold. SEEK IMMEDIATE MEDICAL CARE IF:   There is increasing redness,   swelling, or pain in or around a wound.  There is a red line that goes up your leg.  Pus is coming from a wound.  You develop a fever or as directed by your health care provider.  You notice a bad smell coming from an ulcer or wound.   This information is not intended to replace advice given to you by your health care provider. Make sure you discuss any questions you have with your health care provider.   Document Released: 12/03/2000 Document Revised: 08/08/2013 Document Reviewed: 05/15/2013 Elsevier Interactive Patient Education 2016 [employer]  

## 2016-07-15 NOTE — Progress Notes (Signed)
Patient ID: Frank Mclean, male   DOB: 12/23/1940, 75 y.o.   MRN: [id number]  Subjective: This patient presents today again for schedule visit complaining of thickened and elongated toenails which are cough walking wearing shoes and her request toenail debridement  Objective: Orientated 3 No open skin lesions bilaterally DP and PT pulses nonpalpable bilaterally The toenails are hypertrophic, deformed, brittle, elongated and tender direct palpation 6-10  Assessment: Symptomatic onychomycoses 6-10 Peripheral arterial disease Diabetic with a history of claudication and diabetic foot ulcerations  Plan: Debridement toenails 6-10 mechanically and electrically without any bleeding  Reappoint 3 months

## 2016-07-18 ENCOUNTER — Other Ambulatory Visit: Payer: Self-pay | Admitting: Cardiology

## 2016-07-20 ENCOUNTER — Ambulatory Visit (INDEPENDENT_AMBULATORY_CARE_PROVIDER_SITE_OTHER): Payer: Medicare Other | Admitting: *Deleted

## 2016-07-20 DIAGNOSIS — Z7901 Long term (current) use of anticoagulants: Secondary | ICD-10-CM

## 2016-07-20 DIAGNOSIS — Z5181 Encounter for therapeutic drug level monitoring: Secondary | ICD-10-CM

## 2016-07-20 DIAGNOSIS — I6350 Cerebral infarction due to unspecified occlusion or stenosis of unspecified cerebral artery: Secondary | ICD-10-CM

## 2016-07-20 LAB — POCT INR: INR: 1.7

## 2016-07-21 ENCOUNTER — Other Ambulatory Visit: Payer: Self-pay | Admitting: Internal Medicine

## 2016-08-03 ENCOUNTER — Ambulatory Visit (INDEPENDENT_AMBULATORY_CARE_PROVIDER_SITE_OTHER): Payer: Medicare Other | Admitting: Pharmacist

## 2016-08-03 DIAGNOSIS — Z5181 Encounter for therapeutic drug level monitoring: Secondary | ICD-10-CM

## 2016-08-03 DIAGNOSIS — Z7901 Long term (current) use of anticoagulants: Secondary | ICD-10-CM | POA: Diagnosis not present

## 2016-08-03 DIAGNOSIS — I6350 Cerebral infarction due to unspecified occlusion or stenosis of unspecified cerebral artery: Secondary | ICD-10-CM | POA: Diagnosis not present

## 2016-08-03 LAB — POCT INR: INR: 3.9

## 2016-08-17 ENCOUNTER — Ambulatory Visit (INDEPENDENT_AMBULATORY_CARE_PROVIDER_SITE_OTHER): Payer: Medicare Other | Admitting: *Deleted

## 2016-08-17 DIAGNOSIS — I6350 Cerebral infarction due to unspecified occlusion or stenosis of unspecified cerebral artery: Secondary | ICD-10-CM | POA: Diagnosis not present

## 2016-08-17 DIAGNOSIS — Z7901 Long term (current) use of anticoagulants: Secondary | ICD-10-CM | POA: Diagnosis not present

## 2016-08-17 DIAGNOSIS — Z5181 Encounter for therapeutic drug level monitoring: Secondary | ICD-10-CM | POA: Diagnosis not present

## 2016-08-17 LAB — POCT INR: INR: 2.6

## 2016-08-25 ENCOUNTER — Telehealth: Payer: Self-pay | Admitting: Cardiology

## 2016-08-25 ENCOUNTER — Encounter: Payer: Medicare Other | Admitting: *Deleted

## 2016-08-25 NOTE — Telephone Encounter (Signed)
Confirmed remote transmission w/ pt wife.   

## 2016-08-27 ENCOUNTER — Encounter: Payer: Self-pay | Admitting: Cardiology

## 2016-08-29 IMAGING — DX DG CHEST 2V
2 series · 2 of 2 positions shown · non-contrast
Comparison: Preoperative chest x-ray 03/10/2015

CLINICAL DATA: 73-year-old male status post pacemaker placement

EXAM:
CHEST  2 VIEW

[chest lat]
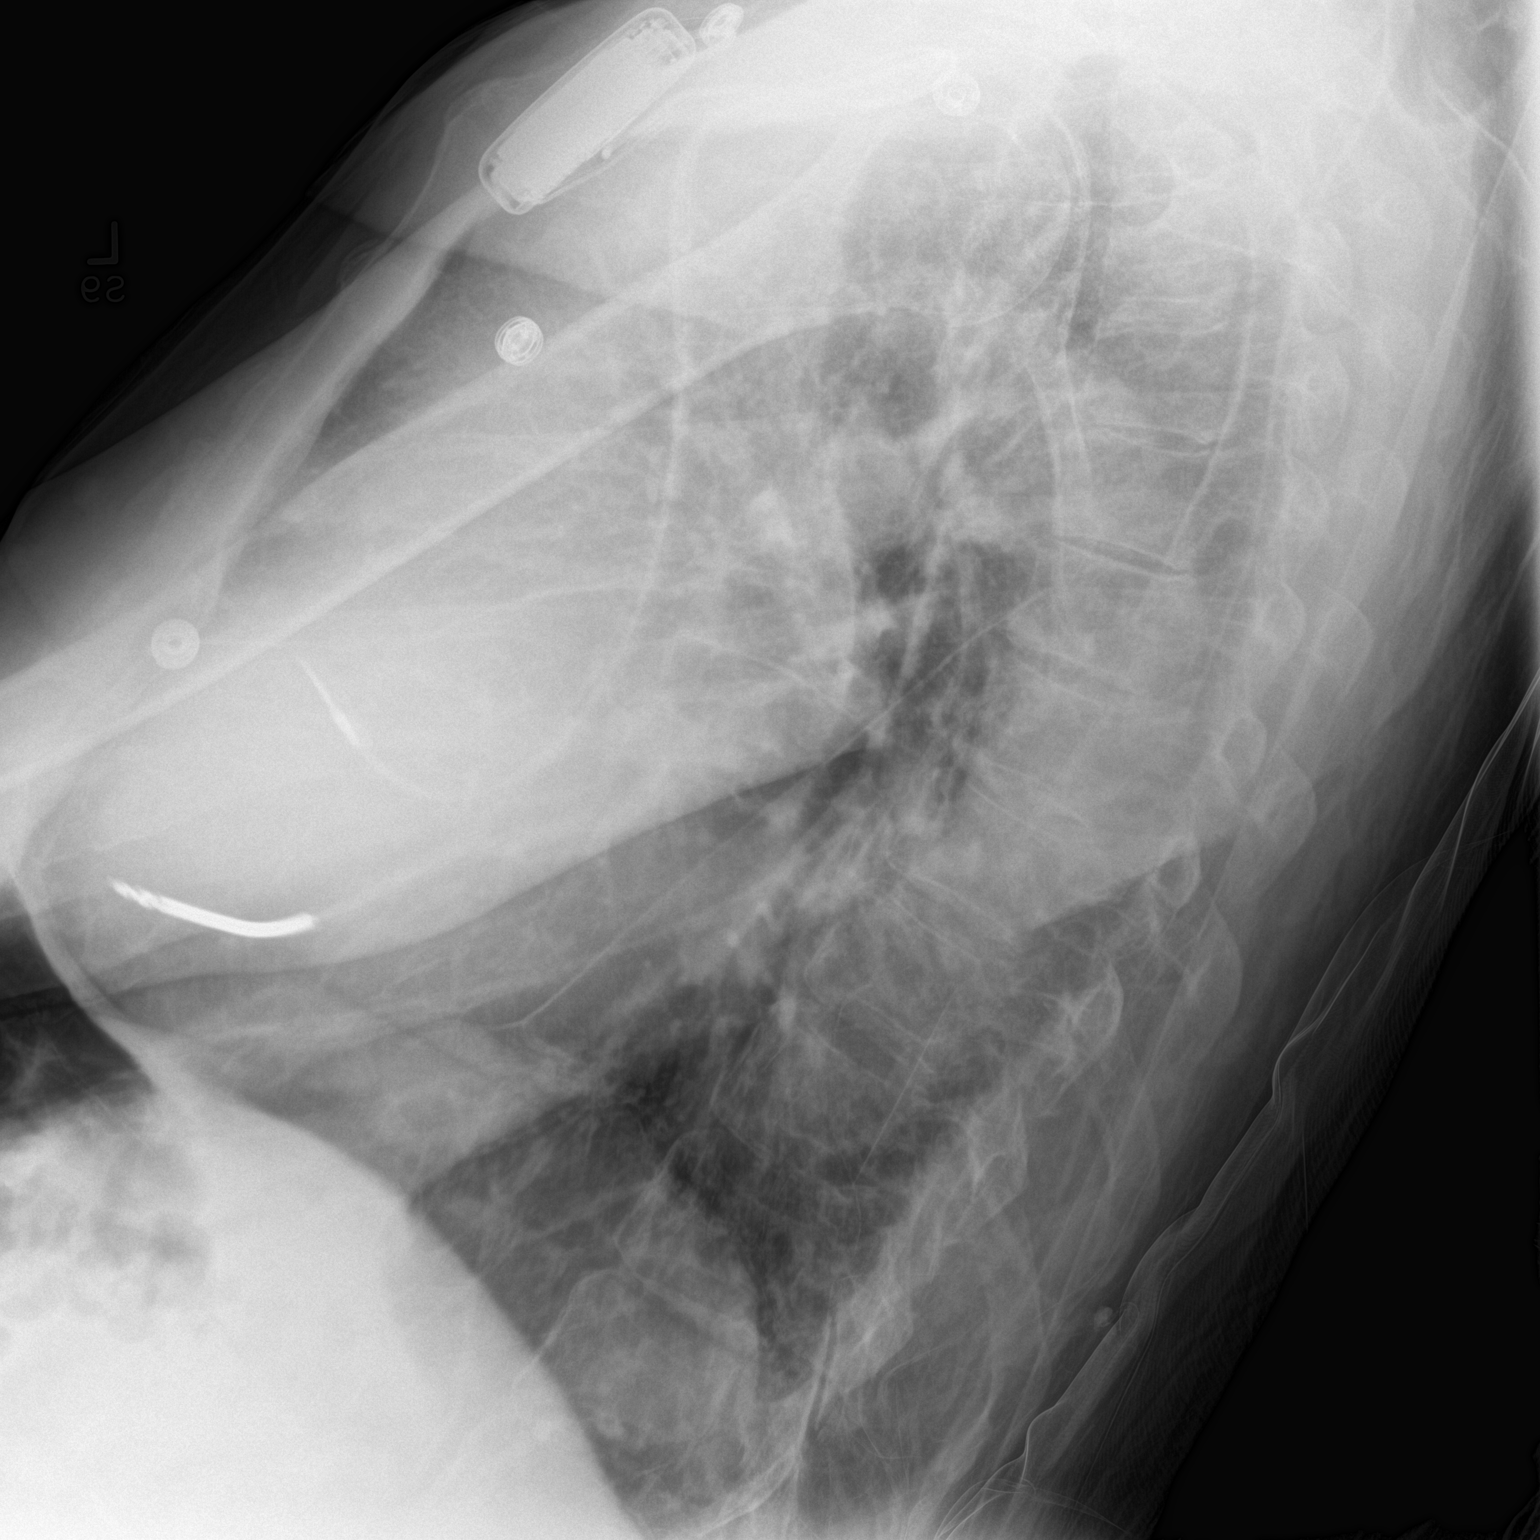

[chest ap]
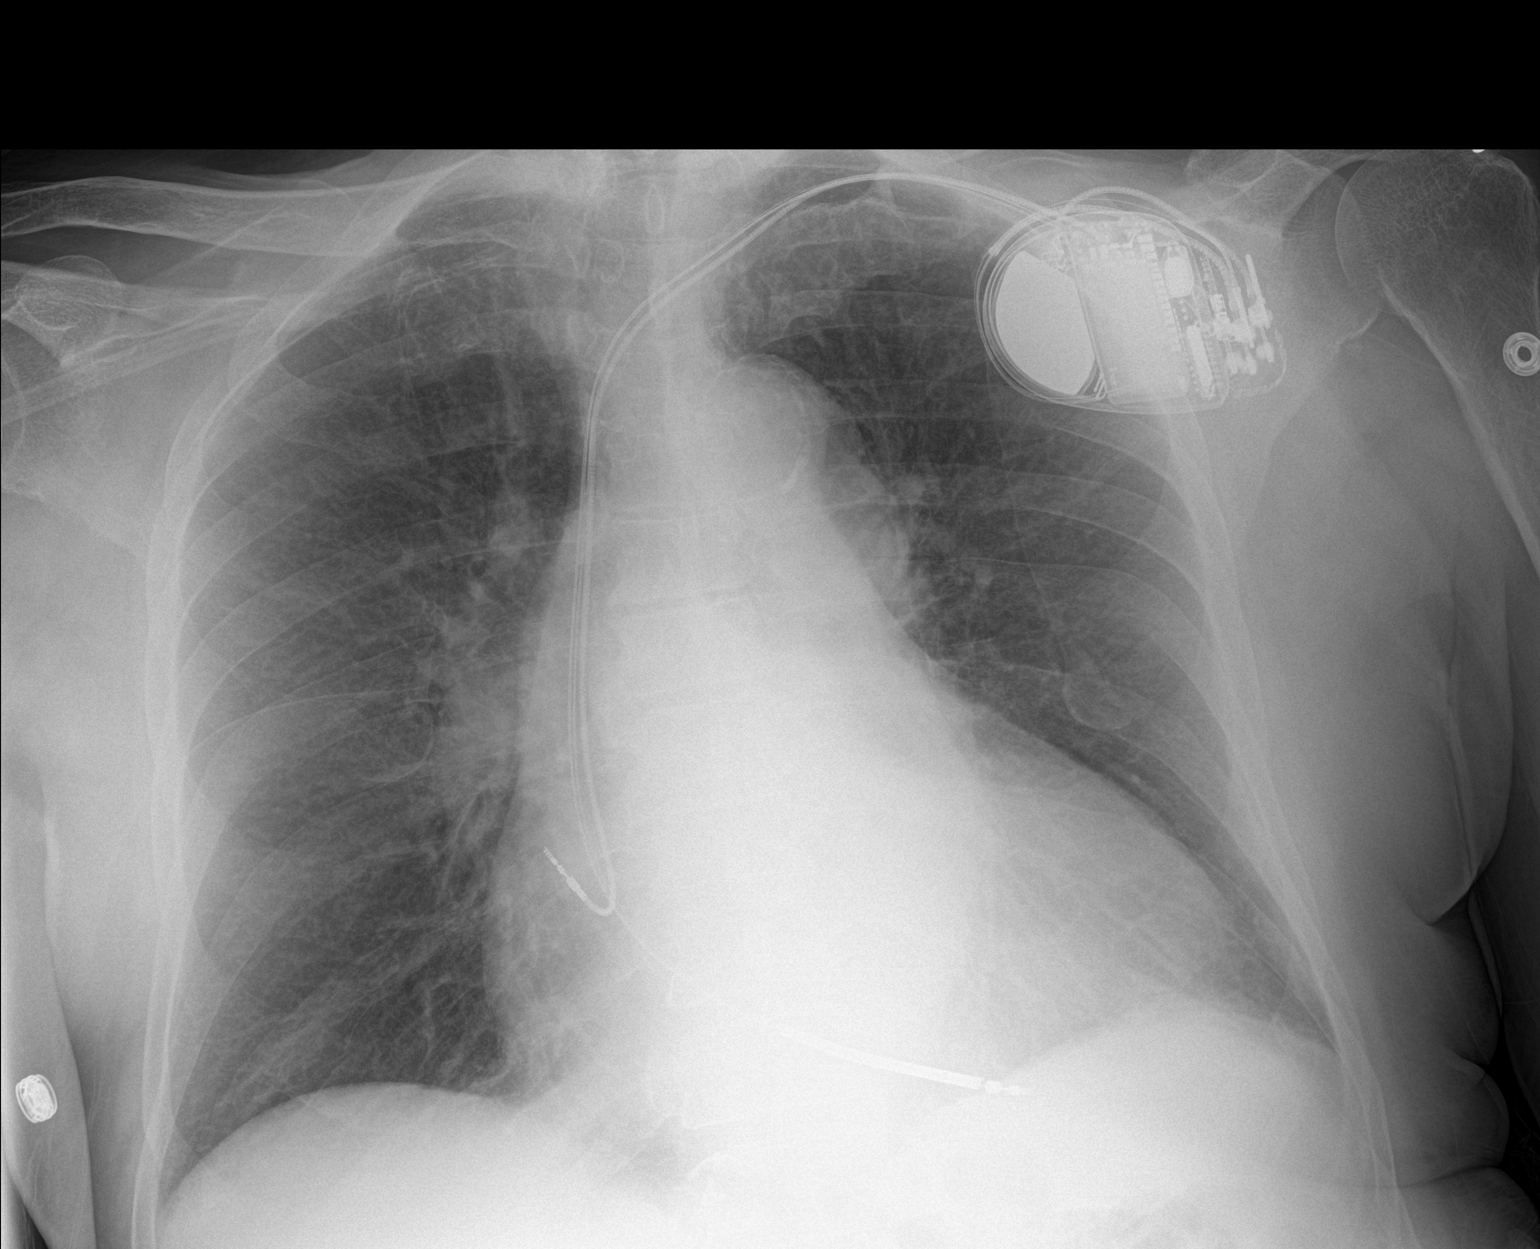

[2 of 2 positions shown; findings below may reference images not displayed]

FINDINGS: New left subclavian approach cardiac rhythm maintenance device.
Leads project over the right atrium and right ventricle. No evidence
of pneumothorax or hemothorax. Stable cardiomegaly. Stable
mediastinal contours. Atherosclerotic calcifications in the aorta.
Pulmonary hyperexpansion and central bronchitic change. No pulmonary
edema. No acute osseous abnormality.
IMPRESSION: 1. New left subclavian approach cardiac rhythm maintenance device
with leads projecting over the right atrium and right ventricle.
2. No evidence of pneumothorax, hemothorax or other complication.

## 2016-09-01 ENCOUNTER — Ambulatory Visit (INDEPENDENT_AMBULATORY_CARE_PROVIDER_SITE_OTHER): Payer: Medicare Other | Admitting: *Deleted

## 2016-09-01 DIAGNOSIS — I428 Other cardiomyopathies: Secondary | ICD-10-CM

## 2016-09-01 DIAGNOSIS — I429 Cardiomyopathy, unspecified: Secondary | ICD-10-CM | POA: Diagnosis not present

## 2016-09-01 NOTE — Progress Notes (Signed)
Remote ICD transmission.   

## 2016-09-02 ENCOUNTER — Encounter: Payer: Self-pay | Admitting: Cardiology

## 2016-09-07 ENCOUNTER — Encounter (HOSPITAL_COMMUNITY): Payer: Self-pay

## 2016-09-07 ENCOUNTER — Ambulatory Visit (INDEPENDENT_AMBULATORY_CARE_PROVIDER_SITE_OTHER): Payer: Medicare Other | Admitting: Pharmacist

## 2016-09-07 ENCOUNTER — Ambulatory Visit (HOSPITAL_COMMUNITY)
Admission: RE | Admit: 2016-09-07 | Discharge: 2016-09-07 | Disposition: A | Discharge status: Home / Self Care | Payer: Medicare Other | Source: Ambulatory Visit | Attending: Cardiology | Admitting: Cardiology

## 2016-09-07 VITALS — BP 111/71 | HR 69 | Resp 18 | Wt 196.5 lb

## 2016-09-07 DIAGNOSIS — Z7901 Long term (current) use of anticoagulants: Secondary | ICD-10-CM | POA: Insufficient documentation

## 2016-09-07 DIAGNOSIS — Z5181 Encounter for therapeutic drug level monitoring: Secondary | ICD-10-CM

## 2016-09-07 DIAGNOSIS — Z95810 Presence of automatic (implantable) cardiac defibrillator: Secondary | ICD-10-CM | POA: Insufficient documentation

## 2016-09-07 DIAGNOSIS — J449 Chronic obstructive pulmonary disease, unspecified: Secondary | ICD-10-CM | POA: Diagnosis not present

## 2016-09-07 DIAGNOSIS — I130 Hypertensive heart and chronic kidney disease with heart failure and stage 1 through stage 4 chronic kidney disease, or unspecified chronic kidney disease: Secondary | ICD-10-CM | POA: Diagnosis not present

## 2016-09-07 DIAGNOSIS — Z8673 Personal history of transient ischemic attack (TIA), and cerebral infarction without residual deficits: Secondary | ICD-10-CM | POA: Diagnosis not present

## 2016-09-07 DIAGNOSIS — E039 Hypothyroidism, unspecified: Secondary | ICD-10-CM | POA: Insufficient documentation

## 2016-09-07 DIAGNOSIS — I429 Cardiomyopathy, unspecified: Secondary | ICD-10-CM | POA: Insufficient documentation

## 2016-09-07 DIAGNOSIS — N183 Chronic kidney disease, stage 3 unspecified: Secondary | ICD-10-CM

## 2016-09-07 DIAGNOSIS — I2510 Atherosclerotic heart disease of native coronary artery without angina pectoris: Secondary | ICD-10-CM | POA: Diagnosis not present

## 2016-09-07 DIAGNOSIS — F0390 Unspecified dementia without behavioral disturbance: Secondary | ICD-10-CM | POA: Insufficient documentation

## 2016-09-07 DIAGNOSIS — N400 Enlarged prostate without lower urinary tract symptoms: Secondary | ICD-10-CM | POA: Diagnosis not present

## 2016-09-07 DIAGNOSIS — N184 Chronic kidney disease, stage 4 (severe): Secondary | ICD-10-CM

## 2016-09-07 DIAGNOSIS — E785 Hyperlipidemia, unspecified: Secondary | ICD-10-CM | POA: Diagnosis not present

## 2016-09-07 DIAGNOSIS — I480 Paroxysmal atrial fibrillation: Secondary | ICD-10-CM | POA: Insufficient documentation

## 2016-09-07 DIAGNOSIS — Z79899 Other long term (current) drug therapy: Secondary | ICD-10-CM | POA: Insufficient documentation

## 2016-09-07 DIAGNOSIS — I447 Left bundle-branch block, unspecified: Secondary | ICD-10-CM | POA: Insufficient documentation

## 2016-09-07 DIAGNOSIS — I5022 Chronic systolic (congestive) heart failure: Secondary | ICD-10-CM | POA: Diagnosis not present

## 2016-09-07 DIAGNOSIS — I6350 Cerebral infarction due to unspecified occlusion or stenosis of unspecified cerebral artery: Secondary | ICD-10-CM

## 2016-09-07 DIAGNOSIS — M109 Gout, unspecified: Secondary | ICD-10-CM | POA: Insufficient documentation

## 2016-09-07 DIAGNOSIS — E870 Hyperosmolality and hypernatremia: Secondary | ICD-10-CM | POA: Diagnosis not present

## 2016-09-07 DIAGNOSIS — I739 Peripheral vascular disease, unspecified: Secondary | ICD-10-CM

## 2016-09-07 DIAGNOSIS — E1122 Type 2 diabetes mellitus with diabetic chronic kidney disease: Secondary | ICD-10-CM

## 2016-09-07 LAB — CBC
HEMATOCRIT: 33.1 % — AB (ref 39.0–52.0)
HEMOGLOBIN: 8.4 g/dL — AB (ref 13.0–17.0)
MCH: 21.3 pg — ABNORMAL LOW (ref 26.0–34.0)
MCHC: 25.4 g/dL — ABNORMAL LOW (ref 30.0–36.0)
MCV: 83.8 fL (ref 78.0–100.0)
Platelets: 143 10*3/uL — ABNORMAL LOW (ref 150–400)
RBC: 3.95 MIL/uL — AB (ref 4.22–5.81)
RDW: 17.1 % — ABNORMAL HIGH (ref 11.5–15.5)
WBC: 6.4 10*3/uL (ref 4.0–10.5)

## 2016-09-07 LAB — BASIC METABOLIC PANEL
Anion gap: 10 (ref 5–15)
BUN: 29 mg/dL — AB (ref 6–20)
CALCIUM: 9.4 mg/dL (ref 8.9–10.3)
CO2: 23 mmol/L (ref 22–32)
CREATININE: 2.79 mg/dL — AB (ref 0.61–1.24)
Chloride: 112 mmol/L — ABNORMAL HIGH (ref 101–111)
GFR calc Af Amer: 24 mL/min — ABNORMAL LOW (ref >60)
GFR, EST NON AFRICAN AMERICAN: 20 mL/min — AB (ref >60)
GLUCOSE: 87 mg/dL (ref 65–99)
Potassium: 4.3 mmol/L (ref 3.5–5.1)
Sodium: 145 mmol/L (ref 135–145)

## 2016-09-07 LAB — BRAIN NATRIURETIC PEPTIDE: B NATRIURETIC PEPTIDE 5: 463.1 pg/mL — AB (ref 0.0–100.0)

## 2016-09-07 LAB — POCT INR: INR: 2.1

## 2016-09-07 MED ORDER — ISOSORBIDE MONONITRATE ER 30 MG PO TB24: 30.0000 mg | ORAL_TABLET | Freq: Two times a day (BID) | ORAL | 6 refills | Status: DC

## 2016-09-07 MED ORDER — ATORVASTATIN CALCIUM 20 MG PO TABS: 20.0000 mg | ORAL_TABLET | Freq: Every day | ORAL | 3 refills | Status: DC

## 2016-09-07 NOTE — Progress Notes (Signed)
Advanced Heart Failure Medication Review by a Pharmacist  Does the patient  feel that his/her medications are working for him/her?  yes  Has the patient been experiencing any side effects to the medications prescribed?  no  Does the patient measure his/her own blood pressure or blood glucose at home?  no   Does the patient have any problems obtaining medications due to transportation or finances?   no  Understanding of regimen: good Understanding of indications: good Potential of compliance: good Patient understands to avoid NSAIDs. Patient understands to avoid decongestants.  Issues to address at subsequent visits: None   Pharmacist comments:  Frank Mclean is a pleasant 75 yo M presenting with his wife and multiple medication lists. She states he is taking amlodipine 10 mg daily which is not on his list. I have verified with [facility] that he has been filling amlodipine 5 mg daily. Also asked again about whether or not he should be taking glipizide and I have again advised her to discuss this with Dr. Posey Rea.   Frank Mclean. Bonnye Fava, PharmD, BCPS, CPP Clinical Pharmacist Pager: [phone] Phone: [phone] 09/07/2016 2:14 PM      Time with patient: 10 minutes Preparation and documentation time: 6 minutes Total time: 16 minutes

## 2016-09-07 NOTE — Patient Instructions (Signed)
Refilled Atorvastatin and isosorbide.  Continue Amlodipine 5mg  tablet once daily.  Routine lab work today. Will notify you of abnormal results, otherwise no news is good news!  Follow up 3 months with Dr. Shirlee Latch.  Do the following things EVERYDAY: 1) Weigh yourself in the morning before breakfast. Write it down and keep it in a log. 2) Take your medicines as prescribed 3) Eat low salt foods-Limit salt (sodium) to 2000 mg per day.  4) Stay as active as you can everyday 5) Limit all fluids for the day to less than 2 liters

## 2016-09-07 NOTE — Progress Notes (Signed)
Patient ID: Frank Mclean, male   DOB: 04/15/1941, 75 y.o.   MRN: [id number]    Advanced Heart Failure Clinic Note   PCP:  Sonda PrimesAlex Plotnikov, MD  Electrophysiologist:  Dr. Lewayne BuntingGregg Taylor  HF Cardiology: Dr Shirlee LatchMcLean   History of Present Illness: Frank Mclean is a 75 y.o. 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   He was seen in the ER on 06/09/15 with dyspnea and LE edema at which time Hgb was 8.6 (runs 8-10 range), Cr 1.85 (most recently 1.93), BNP 384. CXR had shown small left pleural effusion and COPD. He was noted to have wheezing and rhonchi. He was treated with IV Lasix.    In 9/16, LV epicardial lead was placed.    He was admitted in 3/17 with hypernatremia, TUI, and worsening dementia.  He was seen by Tereso NewcomerScott Weaver in 4/17 and was noted to be in atrial fibrillation with increased weight.  He was admitted and diuresed.  He was cardioverted to NSR.  Last echo in 4/17 showed EF 20-25%.   He presents today for regular follow up. At last visit increased Carvedilol. Wife has several medicine lists. Not completely sure how he is taking amlodipine or imdur. Thinks 10 mg daily and 30 mg BID, respectively. Says his breathing has improved. Has been off 02 for about a month. Breathing OK on flat ground. Is OK walking around a grocery store pushing the cart and taking his time. Weight at home stable in 190s. Denies orthopnea or PND. No CP, lightheadedness, dizziness, or palpitations.  Hasn't taken any extra torsemide.   Optivol interrogated: Fluid index mildly elevated, thoracic impedence mildly down, Pt activity ~ 30-60 minutes daily. No AT/AF.   Labs (7/15): LDL 186 Labs (2/16): K 4.9, creatinine 2.91 => 2.62, proBNP 432, HCT 34.2 Labs (7/16): creatinine 2.0, K  4.1 Labs (5/17): K 3.6 =>3.1, Na 150=>148, creatinine 1.97=>2.34, HCT 33.3, TSH normal, BNP 824 Labs (6/17): BNP 369, K 4.3, creatinine 2.84  PMH: 1. CKD: Followed by Dr Lowell GuitarPowell. 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 He is now off amiodarone.  3. 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 He had cardiac cath in 4/12 with mild nonobstructive disease. Echo (6/14) with EF 25-30%, prominent apical trabeculations, diffuse hypokinesis, mild MR. Lexiscan Cardiolite in 1/16 showed a large, severe primarily fixed inferior and apical perfusion defect with EF 25%, suggestive of prior infarct with mild peri-infarct ischemia.  RHC (2/16) with mean RA 6, PA 62/32 mean 46, PCWP 30, CI 1.81.  Echo (3/16) with EF 30-35%, diffuse hypokinesis, mildly dilated RV with moderately decreased systolic function, moderate MR, PASP 54 mmHg. Medtronic ICD (3/16), unable to place LV lead.  - 9/16 epicardial lead placed.  - Echo (4/17) with EF 20-25%, moderate LV dilation, moderately dilated RV with mildly decreased systolic function, moderate to severe TR.  4. CVA: 2004.  5. HTN 6. Gout 7. Hyperlipidemia 8. BPH 9. H/o LBBB 10. Atrial fibrillation: Paroxysmal. DCCV in 4/17.  11. PAD: Has been followed by [facility] (Dr Darrick PennaFields). History of foot ulcers, now healed.  12. Anemia: Suspect related to CKD.  13. Dementia  SH: Married, [place], never smoked, no ETOH.   FH: Father lived into his 690s, no significant cardiac disease.     Current Outpatient Prescriptions  Medication  Sig Dispense Refill  . acetaminophen (TYLENOL) 325 MG tablet Take 1 tablet (325 mg total) by mouth every 6 (six) hours as needed for mild pain (or Fever >/= 101).    [name] amiodarone (PACERONE) 200 MG tablet Take 200 mg by mouth daily.    [name] amLODipine (NORVASC) 10 MG tablet Take 10 mg by mouth daily.    [name] atorvastatin (LIPITOR) 20 MG tablet Take 1 tablet (20 mg total) by mouth daily. 90 tablet 3  . carvedilol (COREG) 6.25 MG  tablet Take 1.5 tablets (9.375 mg total) by mouth 2 (two) times daily with a meal. 90 tablet 6  . Cholecalciferol (VITAMIN D-3 PO) Take 1,000 Units by mouth daily.    [name] escitalopram (LEXAPRO) 5 MG tablet TAKE 1 TABLET BY MOUTH EVERY DAY 90 tablet 3  . ferrous sulfate 325 (65 FE) MG tablet Take 1 tablet (325 mg total) by mouth daily. 30 tablet 11  . hydrALAZINE (APRESOLINE) 50 MG tablet Take 1 tablet (50 mg total) by mouth 3 (three) times daily. 90 tablet 3  . isosorbide mononitrate (IMDUR) 30 MG 24 hr tablet Take 1 tablet (30 mg total) by mouth 2 (two) times daily.    . Menthol, Topical Analgesic, (BIOFREEZE) 4 % GEL Apply topically 2 (two) times daily. Apply to left shoulder and left ankle    . OXYGEN Inhale 2.5 L/min into the lungs at bedtime. Reported on 05/05/2016    . potassium chloride SA (KLOR-CON M20) 20 MEQ tablet Take 2 tablets (40 mEq total) by mouth 2 (two) times daily. 240 tablet 2  . spironolactone (ALDACTONE) 25 MG tablet Take 12.5 mg by mouth daily.    . SYMBICORT 160-4.5 MCG/ACT inhaler INHALE 2 PUFFS INTO THE LUNGS 2 TIMES DAILY 10.2 Inhaler 5  . SYNTHROID 100 MCG tablet Take 2 tablets by mouth every morning.  11  . torsemide (DEMADEX) 20 MG tablet Take 1-2 tablets (20-40 mg total) by mouth 2 (two) times daily. Take 40 mg in the AM and 20 mg in the PM 120 tablet 2  . triamcinolone cream (KENALOG) 0.5 % Apply 1 application topically 2 (two) times daily as needed (itching).    . vitamin B-12 (CYANOCOBALAMIN) 1000 MCG tablet Take 1,000 mcg by mouth daily.    [name] warfarin (COUMADIN) 5 MG tablet TAKE 1 TABLET BY MOUTH DAILY OR AS DIRECTED BY COUMADIN CLINIC. 30 tablet 1   No current facility-administered medications for this encounter.   Review of Systems  Cardiovascular: Positive for dyspnea on exertion. Negative for chest pain, irregular heartbeat, near-syncope, orthopnea, palpitations and syncope.  Musculoskeletal: Positive for arthritis and joint pain.  All other systems reviewed  and are negative.    PHYSICAL EXAM: VS:  BP 111/71 (BP Location: Right Arm, Patient Position: Sitting, Cuff Size: Normal)   Pulse 69   Resp 18   Wt 196 lb 8 oz (89.1 kg)   SpO2 98%   BMI 25.23 kg/m     Wt Readings from Last 3 Encounters:  09/07/16 196 lb 8 oz (89.1 kg)  07/06/16 196 lb (88.9 kg)  05/10/16 195 lb 12 oz (88.8 kg)     GEN: Elderly appearing, NAD. Wife present HEENT: normal   Neck: JVP 6-7 cm Cardiac:  Normal S1/S2, RRR; 2/6 HSM LLSB,  no rubs or gallops, Trace ankle edema.    Respiratory:  CTAB, normal effort GI: soft, NT, ND, no HSM. No bruits or masses. +BS  MS: no deformity or atrophy  Skin: warm and  dry  Neuro:  CNs II-XII intact, Strength and sensation are intact Psych: Flat affect, not very interactive.    ASSESSMENT AND PLAN: 1.  Chronic systolic CHF: Nonischemic cardiomyopathy. Medtronic CRT-D with epicardial lead. Echo (4/17) with EF 20-25% and moderately decreased RV systolic function. NYHA class III symptoms.  Volume status looks stable but trending up.  Encouraged watching salt and fluid. on exam and optivol. - Continue torsemide 40 q am and 20 mg q pm.  Can take extra 20 mg as needed in evening.BMET/BNP today.  - Continue spironolactone 25 mg daily.  - Continue Coreg 9.375 mg BID - Continue hydralazine 50 mg tid, continue Imdur 30 bid.   - BMET/BNP today.  2. PAF  Maintaining NSR since DCCV. - Continue amiodarone at 200 mg daily.  This was stopped in the past due to hypothyroidism, but he is on thyroid replacement now.  LFTs and TSH stable 04/2016.   He will need regular followup with an eye doctor while on amiodarone.  - Continue warfarin. Follows at coumadin clinic. - CBC today.  3. CKD stage III Creatinine baseline 1.8-2.1.  - Was elevated up to 2.8 in June.   - BMET today. It was a bit higher most recently. BMET today.  4. PAD  Followed by [facility]. 5. DM2 - Was taken off Glipizide for hypoglycemic events - Encouraged to follow up with Dr.  Posey Rea  Meds as above. BMET, BNP, CBC  today.  Follow up 3 months.   Mariam Dollar Arli Bree PA-C 09/07/2016

## 2016-09-08 ENCOUNTER — Encounter: Payer: Self-pay | Admitting: Internal Medicine

## 2016-09-09 LAB — CUP PACEART REMOTE DEVICE CHECK
Brady Statistic AP VP Percent: 98.07 %
Brady Statistic AS VP Percent: 0.82 %
Brady Statistic AS VS Percent: 0.03 %
HIGH POWER IMPEDANCE MEASURED VALUE: 61 Ohm
Implantable Lead Implant Date: 20160323
Implantable Lead Location: 753859
Implantable Lead Location: 753860
Implantable Lead Model: 6935
Lead Channel Impedance Value: 285 Ohm
Lead Channel Impedance Value: 342 Ohm
Lead Channel Impedance Value: 399 Ohm
Lead Channel Impedance Value: 4047 Ohm
Lead Channel Impedance Value: 4047 Ohm
Lead Channel Pacing Threshold Amplitude: 0.625 V
Lead Channel Pacing Threshold Amplitude: 0.75 V
Lead Channel Pacing Threshold Amplitude: 1.375 V
Lead Channel Pacing Threshold Pulse Width: 0.4 ms
Lead Channel Pacing Threshold Pulse Width: 0.4 ms
Lead Channel Sensing Intrinsic Amplitude: 13.75 mV
Lead Channel Sensing Intrinsic Amplitude: 3 mV
Lead Channel Setting Pacing Amplitude: 2 V
Lead Channel Setting Pacing Pulse Width: 0.4 ms
Lead Channel Setting Pacing Pulse Width: 0.4 ms
Lead Channel Setting Sensing Sensitivity: 0.3 mV
MDC IDC LEAD IMPLANT DT: 20160323
MDC IDC LEAD IMPLANT DT: 20160323
MDC IDC LEAD LOCATION: 753858
MDC IDC MSMT BATTERY REMAINING LONGEVITY: 90 mo
MDC IDC MSMT BATTERY VOLTAGE: 2.98 V
MDC IDC MSMT LEADCHNL LV IMPEDANCE VALUE: 304 Ohm
MDC IDC MSMT LEADCHNL RA PACING THRESHOLD PULSEWIDTH: 0.4 ms
MDC IDC MSMT LEADCHNL RA SENSING INTR AMPL: 3 mV
MDC IDC MSMT LEADCHNL RV SENSING INTR AMPL: 13.75 mV
MDC IDC SESS DTM: 20170913174203
MDC IDC SET LEADCHNL LV PACING AMPLITUDE: 2.5 V
MDC IDC SET LEADCHNL RA PACING AMPLITUDE: 1.5 V
MDC IDC STAT BRADY AP VS PERCENT: 1.09 %
MDC IDC STAT BRADY RA PERCENT PACED: 99.15 %
MDC IDC STAT BRADY RV PERCENT PACED: 80.2 %

## 2016-09-16 ENCOUNTER — Encounter: Payer: Self-pay | Admitting: Cardiology

## 2016-09-21 ENCOUNTER — Encounter: Payer: Self-pay | Admitting: Internal Medicine

## 2016-09-30 ENCOUNTER — Other Ambulatory Visit: Payer: Self-pay | Admitting: Cardiology

## 2016-09-30 ENCOUNTER — Other Ambulatory Visit (HOSPITAL_COMMUNITY): Payer: Self-pay | Admitting: Cardiology

## 2016-10-05 ENCOUNTER — Ambulatory Visit: Payer: Medicare Other | Admitting: Podiatry

## 2016-10-13 ENCOUNTER — Ambulatory Visit: Payer: Medicare Other | Admitting: Podiatry

## 2016-10-29 ENCOUNTER — Ambulatory Visit: Payer: Medicare Other | Admitting: Internal Medicine

## 2016-11-14 IMAGING — CR DG KNEE COMPLETE 4+V*R*
4 series · 4 of 4 positions shown · non-contrast
Comparison: None.

CLINICAL DATA: Right knee pain.

EXAM:
RIGHT KNEE - COMPLETE 4+ VIEW

[view not recorded (1 of 4)]
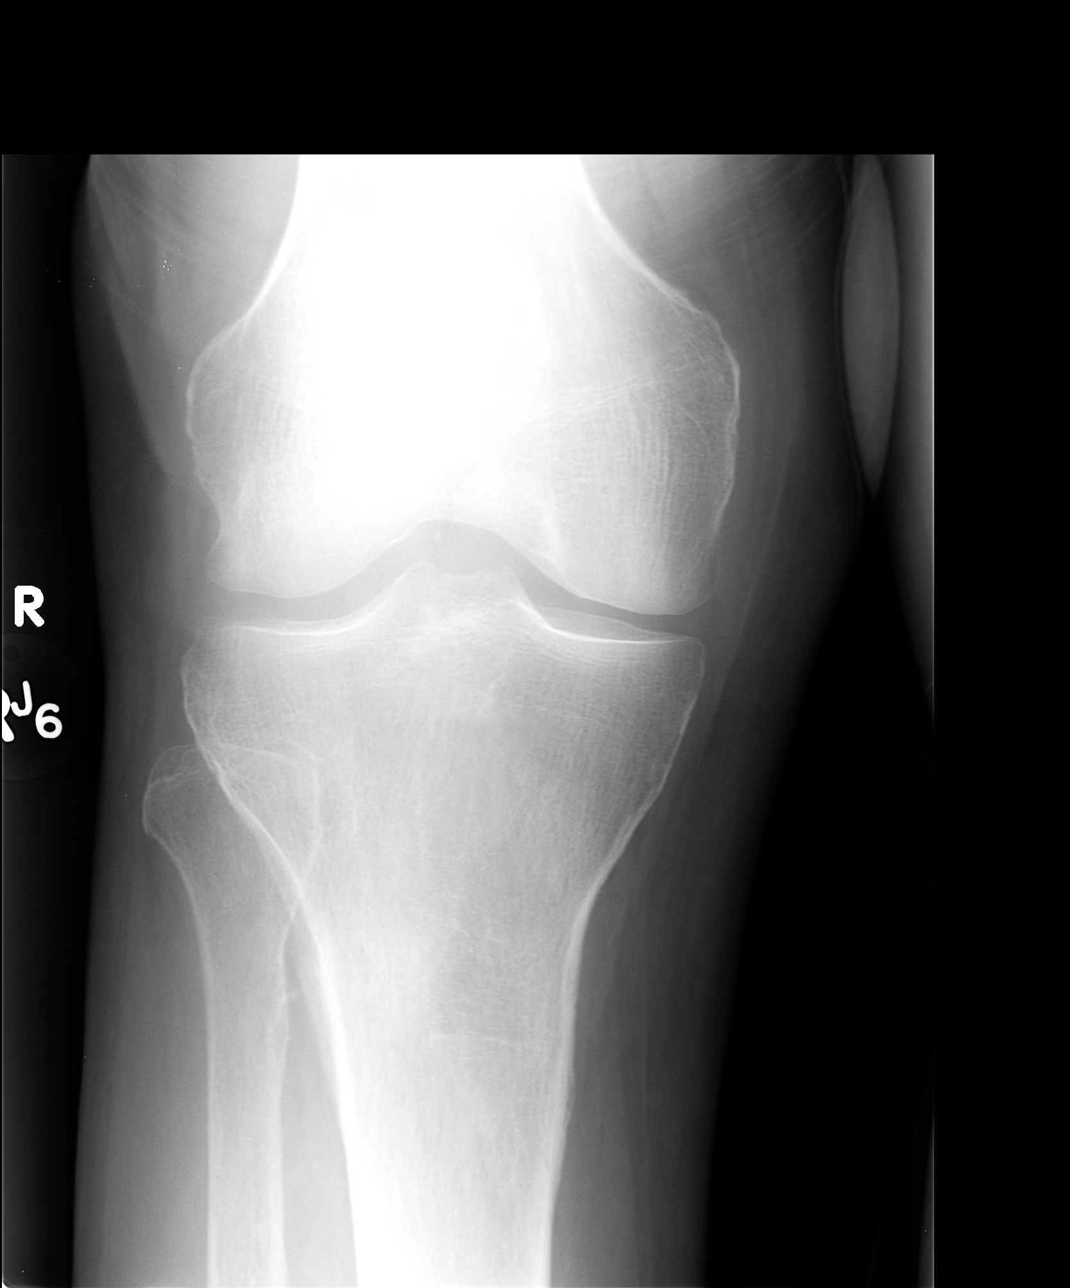

[view not recorded (2 of 4)]
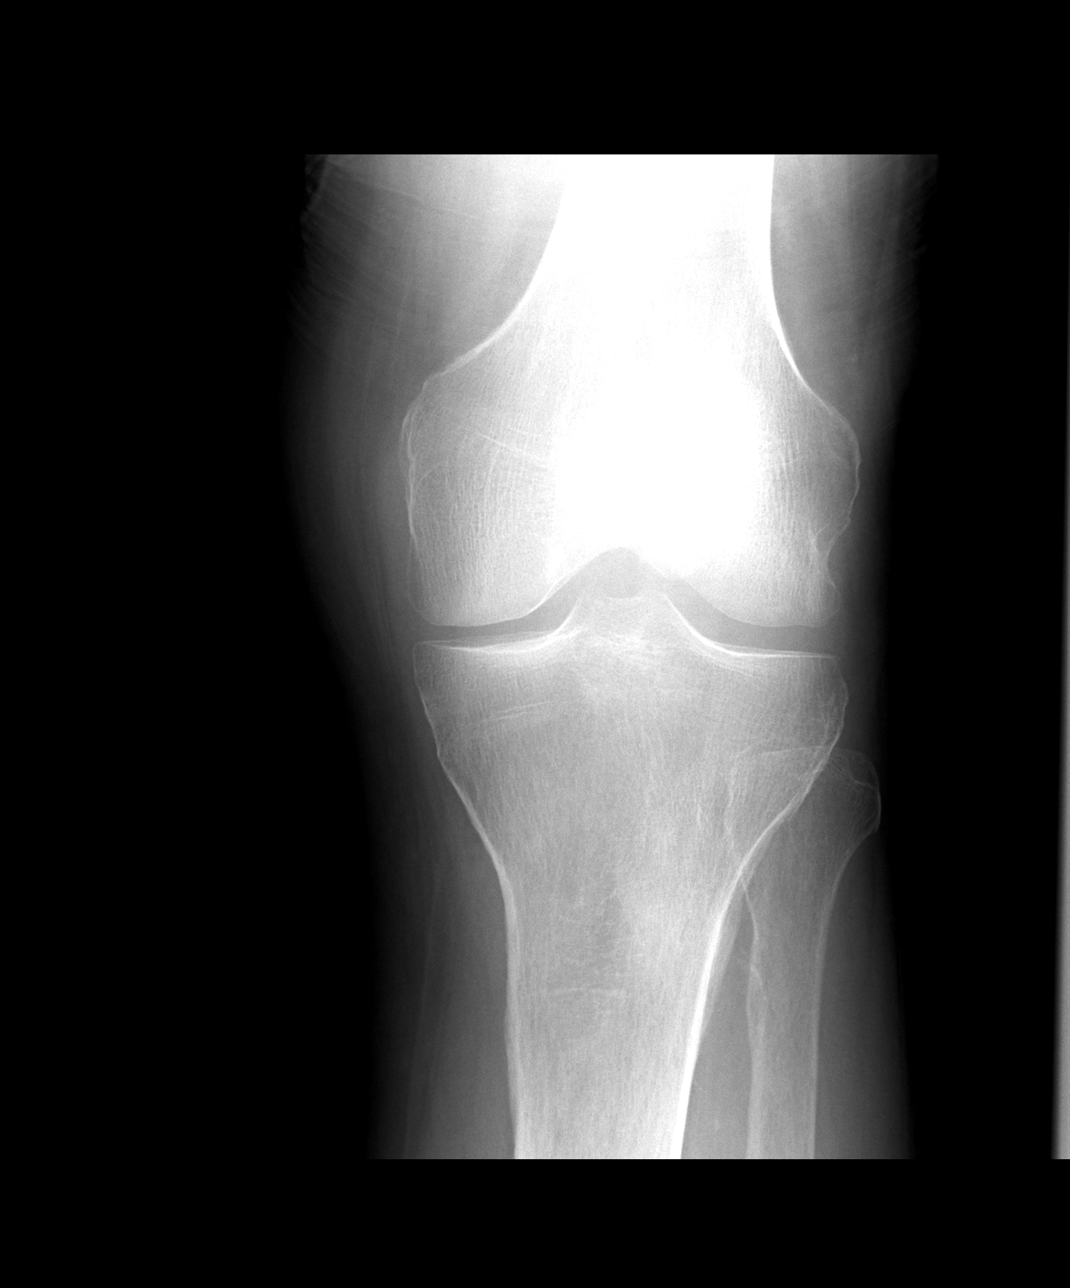

[view not recorded (3 of 4)]
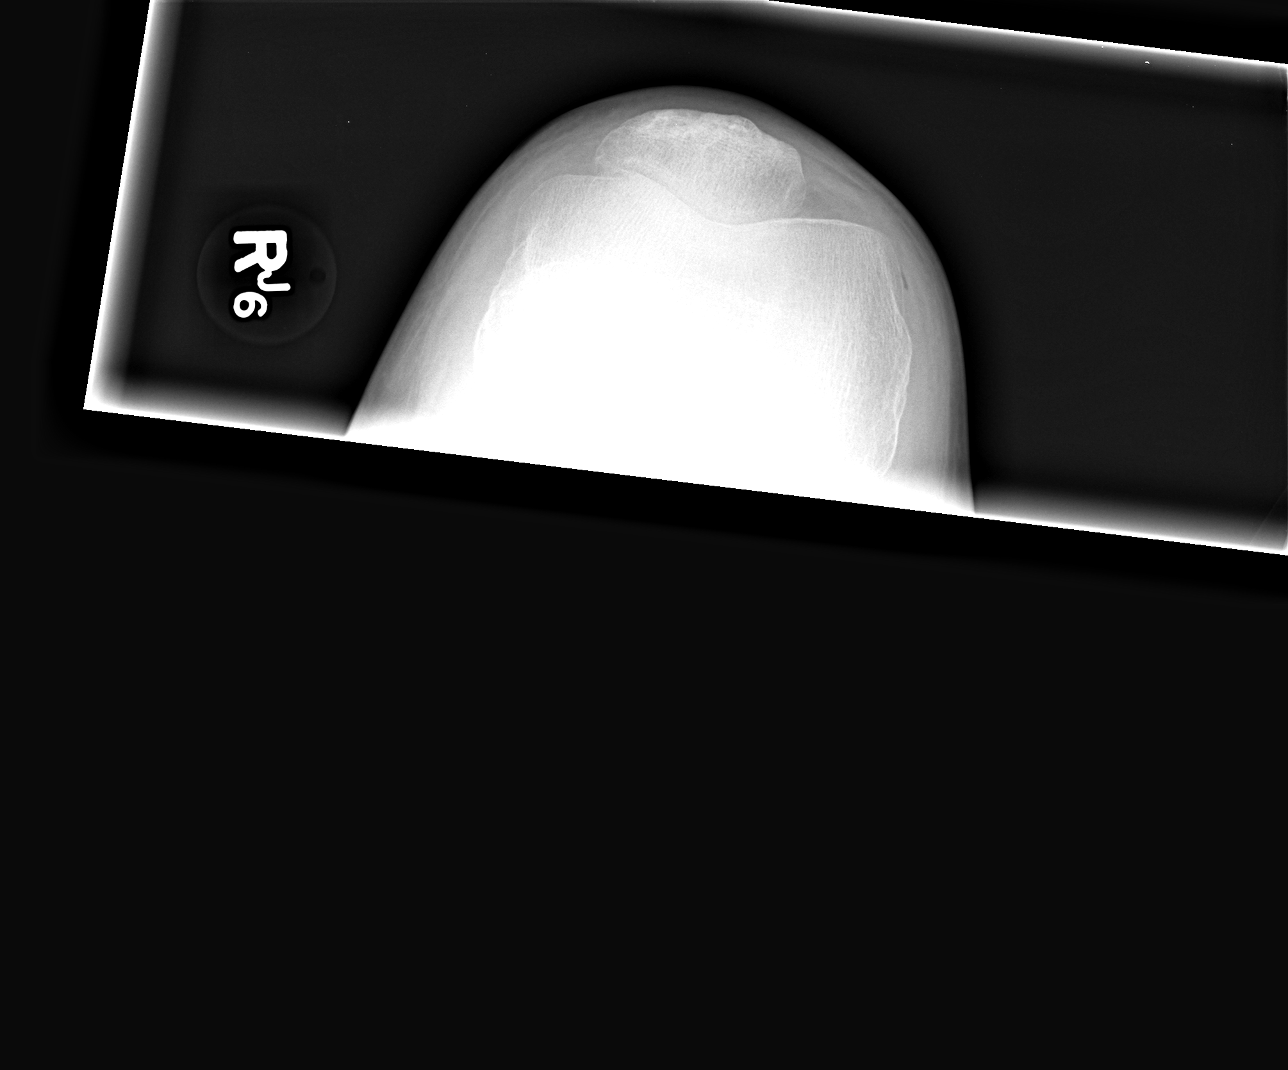

[view not recorded (4 of 4)]
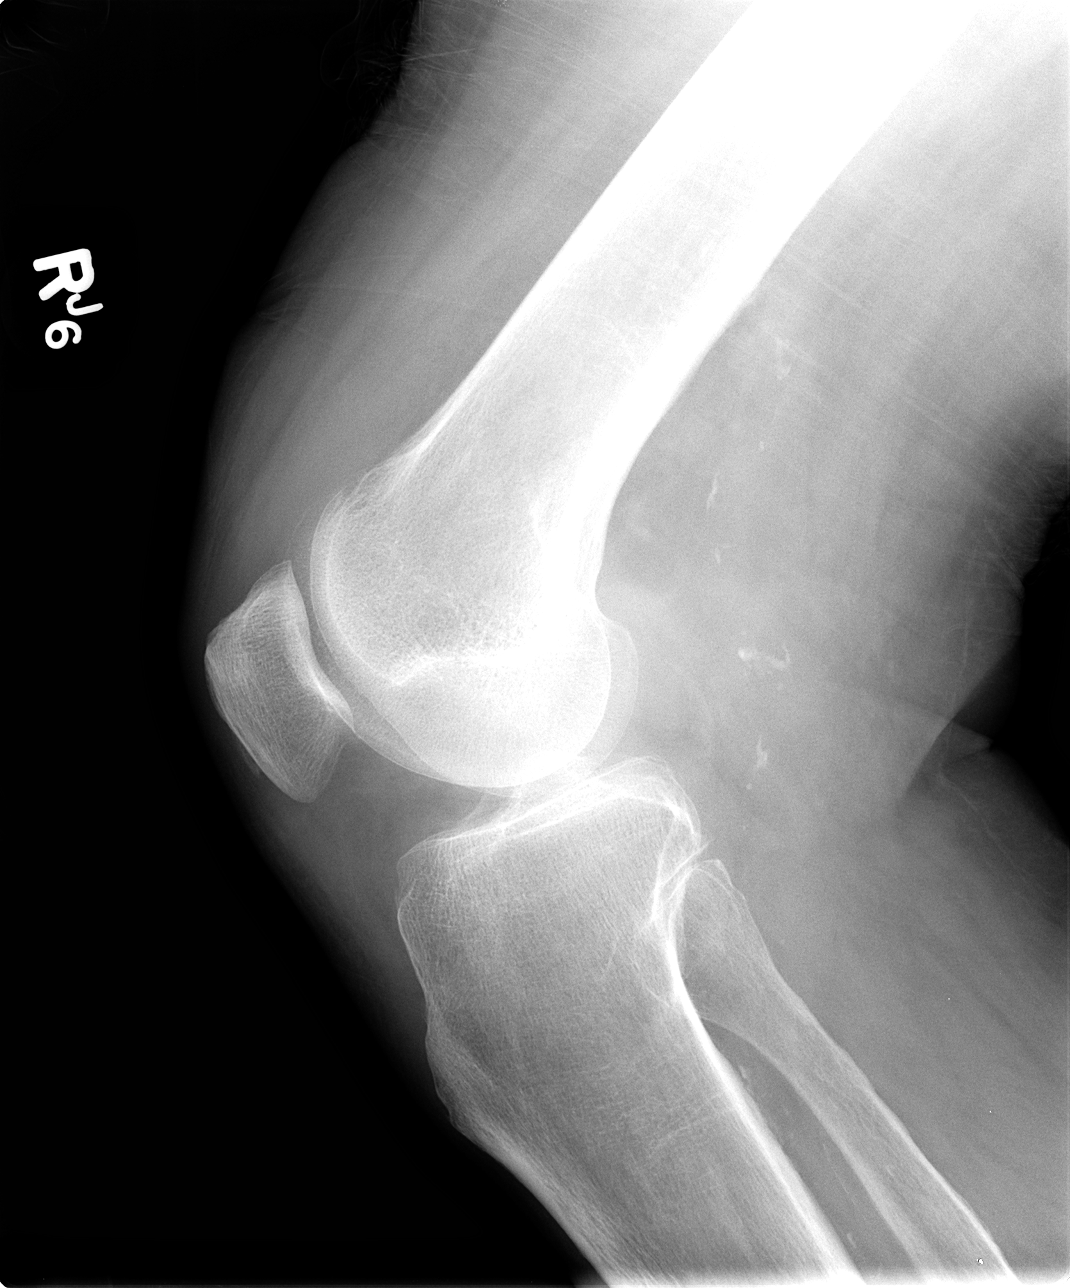

[4 of 4 positions shown; findings below may reference images not displayed]

FINDINGS: There is no fracture or dislocation or degenerative osseous
abnormality. There is slight narrowing of the medial compartment and
there is a small joint effusion. Vascular calcifications posterior
to the knee.
IMPRESSION: Joint effusion.  Slight medial joint space narrowing.

## 2016-12-07 ENCOUNTER — Encounter (HOSPITAL_COMMUNITY): Payer: Self-pay

## 2016-12-07 ENCOUNTER — Ambulatory Visit (HOSPITAL_COMMUNITY)
Admission: RE | Admit: 2016-12-07 | Discharge: 2016-12-07 | Disposition: A | Discharge status: Home / Self Care | Payer: Medicare Other | Source: Ambulatory Visit | Attending: Cardiology | Admitting: Cardiology

## 2016-12-07 VITALS — BP 110/62 | HR 70 | Wt 198.4 lb

## 2016-12-07 DIAGNOSIS — I447 Left bundle-branch block, unspecified: Secondary | ICD-10-CM | POA: Diagnosis not present

## 2016-12-07 DIAGNOSIS — F329 Major depressive disorder, single episode, unspecified: Secondary | ICD-10-CM

## 2016-12-07 DIAGNOSIS — E785 Hyperlipidemia, unspecified: Secondary | ICD-10-CM | POA: Diagnosis not present

## 2016-12-07 DIAGNOSIS — I428 Other cardiomyopathies: Secondary | ICD-10-CM | POA: Diagnosis not present

## 2016-12-07 DIAGNOSIS — Z95810 Presence of automatic (implantable) cardiac defibrillator: Secondary | ICD-10-CM | POA: Insufficient documentation

## 2016-12-07 DIAGNOSIS — I5022 Chronic systolic (congestive) heart failure: Secondary | ICD-10-CM

## 2016-12-07 DIAGNOSIS — I130 Hypertensive heart and chronic kidney disease with heart failure and stage 1 through stage 4 chronic kidney disease, or unspecified chronic kidney disease: Secondary | ICD-10-CM | POA: Insufficient documentation

## 2016-12-07 DIAGNOSIS — N183 Chronic kidney disease, stage 3 unspecified: Secondary | ICD-10-CM

## 2016-12-07 DIAGNOSIS — E1142 Type 2 diabetes mellitus with diabetic polyneuropathy: Secondary | ICD-10-CM | POA: Insufficient documentation

## 2016-12-07 DIAGNOSIS — I739 Peripheral vascular disease, unspecified: Secondary | ICD-10-CM

## 2016-12-07 DIAGNOSIS — I482 Chronic atrial fibrillation, unspecified: Secondary | ICD-10-CM

## 2016-12-07 DIAGNOSIS — Z7901 Long term (current) use of anticoagulants: Secondary | ICD-10-CM | POA: Insufficient documentation

## 2016-12-07 DIAGNOSIS — E1151 Type 2 diabetes mellitus with diabetic peripheral angiopathy without gangrene: Secondary | ICD-10-CM | POA: Insufficient documentation

## 2016-12-07 DIAGNOSIS — I2510 Atherosclerotic heart disease of native coronary artery without angina pectoris: Secondary | ICD-10-CM | POA: Insufficient documentation

## 2016-12-07 DIAGNOSIS — E039 Hypothyroidism, unspecified: Secondary | ICD-10-CM | POA: Diagnosis not present

## 2016-12-07 DIAGNOSIS — I480 Paroxysmal atrial fibrillation: Secondary | ICD-10-CM | POA: Insufficient documentation

## 2016-12-07 DIAGNOSIS — Z79899 Other long term (current) drug therapy: Secondary | ICD-10-CM | POA: Diagnosis not present

## 2016-12-07 DIAGNOSIS — Z8673 Personal history of transient ischemic attack (TIA), and cerebral infarction without residual deficits: Secondary | ICD-10-CM | POA: Diagnosis not present

## 2016-12-07 DIAGNOSIS — R5383 Other fatigue: Secondary | ICD-10-CM

## 2016-12-07 DIAGNOSIS — R531 Weakness: Secondary | ICD-10-CM | POA: Diagnosis not present

## 2016-12-07 DIAGNOSIS — F32A Depression, unspecified: Secondary | ICD-10-CM

## 2016-12-07 LAB — CBC
HCT: 42.1 % (ref 39.0–52.0)
Hemoglobin: 13.6 g/dL (ref 13.0–17.0)
MCH: 27.5 pg (ref 26.0–34.0)
MCHC: 32.3 g/dL (ref 30.0–36.0)
MCV: 85.2 fL (ref 78.0–100.0)
PLATELETS: 200 10*3/uL (ref 150–400)
RBC: 4.94 MIL/uL (ref 4.22–5.81)
RDW: 16.2 % — AB (ref 11.5–15.5)
WBC: 8.9 10*3/uL (ref 4.0–10.5)

## 2016-12-07 LAB — COMPREHENSIVE METABOLIC PANEL
ALK PHOS: 119 U/L (ref 38–126)
ALT: 23 U/L (ref 17–63)
ANION GAP: 11 (ref 5–15)
AST: 25 U/L (ref 15–41)
Albumin: 4.1 g/dL (ref 3.5–5.0)
BILIRUBIN TOTAL: 0.8 mg/dL (ref 0.3–1.2)
BUN: 47 mg/dL — ABNORMAL HIGH (ref 6–20)
CALCIUM: 9.4 mg/dL (ref 8.9–10.3)
CO2: 26 mmol/L (ref 22–32)
CREATININE: 4.63 mg/dL — AB (ref 0.61–1.24)
Chloride: 107 mmol/L (ref 101–111)
GFR, EST AFRICAN AMERICAN: 13 mL/min — AB (ref >60)
GFR, EST NON AFRICAN AMERICAN: 11 mL/min — AB (ref >60)
Glucose, Bld: 107 mg/dL — ABNORMAL HIGH (ref 65–99)
Potassium: 5.4 mmol/L — ABNORMAL HIGH (ref 3.5–5.1)
SODIUM: 144 mmol/L (ref 135–145)
TOTAL PROTEIN: 6.8 g/dL (ref 6.5–8.1)

## 2016-12-07 LAB — TSH: TSH: 0.647 u[IU]/mL (ref 0.350–4.500)

## 2016-12-07 MED ORDER — CARVEDILOL 12.5 MG PO TABS: 12.5000 mg | ORAL_TABLET | Freq: Two times a day (BID) | ORAL | 3 refills | Status: DC

## 2016-12-07 NOTE — Progress Notes (Signed)
Patient ID: Frank Mclean, male   DOB: 10/07/1941, 75 y.o.   MRN: [id number]    Advanced Heart Failure Clinic Note   PCP:  [name], MD  Electrophysiologist:  Dr. Lewayne Bunting  HF Cardiology: Dr Shirlee Latch   History of Present Illness: Frank Mclean is a 75 y.o. 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   He was seen in the ER on 06/09/15 with dyspnea and LE edema at which time Hgb was 8.6 (runs 8-10 range), Cr 1.85 (most recently 1.93), BNP 384. CXR had shown small left pleural effusion and COPD. He was noted to have wheezing and rhonchi. He was treated with IV Lasix.    In 9/16, LV epicardial lead was placed.    He was admitted in 3/17 with hypernatremia, TUI, and worsening dementia.  He was seen by Tereso Newcomer in 4/17 and was noted to be in atrial fibrillation with increased weight.  He was admitted and diuresed.  He was cardioverted to NSR.  Last echo in 4/17 showed EF 20-25%.   Weight is up 2 lbs since last appointment.  Appetite has been good. Walks with cane.  Gets fatigued after walking 100-200 feet, but this is stable.  He walked in today without stopping. No orthopnea/PND.  No chest pain.  No palpitations.     Optivol interrogated: Fluid index < threshold though he had a crossing earlier this month.  Impedance now back to baseline. No AT/AF.  >99% BiV pacing.   Labs (7/15): LDL 186 Labs (2/16): K 4.9, creatinine 2.91 => 2.62, proBNP 432, HCT 34.2 Labs (7/16): creatinine 2.0, K 4.1 Labs (5/17): K 3.6 =>3.1, Na 150=>148, creatinine 1.97=>2.34, HCT 33.3, TSH normal, BNP 824 Labs (6/17): BNP 369, K 4.3, creatinine 2.84 Labs (9/17): K 4.3, creatinine 2.79 Labs (12/17): K 5.4, creatinine 4.63  PMH: 1. CKD: Followed by Dr Lowell Guitar. 2.  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 He is now off amiodarone.  3. 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 He had cardiac cath in 4/12 with mild nonobstructive disease. Echo (6/14) with EF 25-30%, prominent apical trabeculations, diffuse hypokinesis, mild MR. Lexiscan Cardiolite in 1/16 showed a large, severe primarily fixed inferior and apical perfusion defect with EF 25%, suggestive of prior infarct with mild peri-infarct ischemia.  RHC (2/16) with mean RA 6, PA 62/32 mean 46, PCWP 30, CI 1.81.  Echo (3/16) with EF 30-35%, diffuse hypokinesis, mildly dilated RV with moderately decreased systolic function, moderate MR, PASP 54 mmHg. Medtronic ICD (3/16), unable to place LV lead.  - 9/16 epicardial lead placed.  - Echo (4/17) with EF 20-25%, moderate LV dilation, moderately dilated RV with mildly decreased systolic function, moderate to severe TR.  4. CVA: 2004.  5. HTN 6. Gout 7. Hyperlipidemia 8. BPH 9. H/o LBBB 10. Atrial fibrillation: Paroxysmal. DCCV in 4/17.  11. PAD: Has been followed by [facility] (Dr Darrick Penna). History of foot ulcers, now healed.  12. Anemia: Suspect related to CKD.  13. Dementia  SH: Married, lives in [place], never smoked, no ETOH.   FH: Father lived into his 16s, no significant cardiac disease.   ROS: All systems reviewed and negative except as per HPI.   Current Outpatient Prescriptions  Medication Sig Dispense Refill  . acetaminophen (TYLENOL) 325 MG tablet Take 1 tablet (325 mg  total) by mouth every 6 (six) hours as needed for mild pain (or Fever >/= 101).    [name] amiodarone (PACERONE) 200 MG tablet Take 200 mg by mouth daily.    [name] amLODipine (NORVASC) 5 MG tablet Take 5 mg by mouth daily.    [name] atorvastatin (LIPITOR) 20 MG tablet Take 1 tablet (20 mg total) by mouth daily. 90 tablet 3  . carvedilol (COREG) 12.5 MG tablet Take 1 tablet (12.5 mg total) by mouth 2 (two) times daily with a meal. 60 tablet 3  . Cholecalciferol (VITAMIN D-3 PO) Take  1,000 Units by mouth daily.    [name] escitalopram (LEXAPRO) 5 MG tablet TAKE 1 TABLET BY MOUTH EVERY DAY 90 tablet 3  . ferrous sulfate 325 (65 FE) MG tablet Take 1 tablet (325 mg total) by mouth daily. 30 tablet 11  . hydrALAZINE (APRESOLINE) 50 MG tablet TAKE 1 TABLET (50 MG TOTAL) BY MOUTH 3 (THREE) TIMES DAILY. 90 tablet 3  . isosorbide mononitrate (IMDUR) 30 MG 24 hr tablet Take 1 tablet (30 mg total) by mouth 2 (two) times daily. 60 tablet 6  . KLOR-CON M20 20 MEQ tablet TAKE 2 TABLETS (40 MEQ TOTAL) BY MOUTH 2 (TWO) TIMES DAILY. 120 tablet 3  . Menthol, Topical Analgesic, (BIOFREEZE) 4 % GEL Apply topically 2 (two) times daily. Apply to left shoulder and left ankle    . OXYGEN Inhale 2.5 L/min into the lungs at bedtime. Reported on 05/05/2016    . potassium chloride SA (KLOR-CON M20) 20 MEQ tablet Take 2 tablets (40 mEq total) by mouth 2 (two) times daily. 240 tablet 2  . spironolactone (ALDACTONE) 25 MG tablet Take 12.5 mg by mouth daily.    . SYMBICORT 160-4.5 MCG/ACT inhaler INHALE 2 PUFFS INTO THE LUNGS 2 TIMES DAILY 10.2 Inhaler 5  . SYNTHROID 100 MCG tablet Take 2 tablets by mouth every morning.  11  . torsemide (DEMADEX) 20 MG tablet Take 1-2 tablets (20-40 mg total) by mouth 2 (two) times daily. Take 40 mg in the AM and 20 mg in the PM 120 tablet 2  . triamcinolone cream (KENALOG) 0.5 % Apply 1 application topically 2 (two) times daily as needed (itching).    . vitamin B-12 (CYANOCOBALAMIN) 1000 MCG tablet Take 1,000 mcg by mouth daily.    [name] warfarin (COUMADIN) 5 MG tablet TAKE 1 TABLET BY MOUTH DAILY OR AS DIRECTED BY COUMADIN CLINIC. 30 tablet 1   No current facility-administered medications for this encounter.   Review of Systems  Cardiovascular: Positive for dyspnea on exertion.  All other systems reviewed and are negative.    PHYSICAL EXAM: VS:  BP 110/62   Pulse 70   Wt 198 lb 6.4 oz (90 kg)   SpO2 97%   BMI 25.47 kg/m     Wt Readings from Last 3 Encounters:  12/07/16  198 lb 6.4 oz (90 kg)  09/07/16 196 lb 8 oz (89.1 kg)  07/06/16 196 lb (88.9 kg)     GEN: Elderly appearing, NAD.  HEENT: normal   Neck: JVP 7 cm Cardiac:  Normal S1/S2, RRR; 2/6 HSM LLSB,  no rubs or gallops, no edema.    Respiratory:  Decreased breath sounds bilaterally.  GI: soft, nontender, nondistended, + BS MS: no deformity or atrophy  Skin: warm and dry  Neuro:  CNs II-XII intact, Strength and sensation are intact Psych: Flat affect, not very interactive.    ASSESSMENT AND PLAN: 1.  Chronic systolic CHF: Nonischemic cardiomyopathy.  Medtronic CRT-D with epicardial lead. Echo (4/17) with EF 20-25% and moderately decreased RV function.  NYHA class II-III symptoms.  Volume stable by exam and Optivol.  Labs sent at appointment today showed creatinine up to 4.63.  - Hold torsemide x 4 days then decrease to 20 mg daily.  BMET in 7 days.  - Stop spironolactone and KCl with high K.  - Increase Coreg 12.5 mg BID.  - Continue hydralazine 50 mg tid, continue Imdur 30 bid.   2.  PAF:  Maintaining NSR since DCCV. - Continue amiodarone at 200 mg daily.  This was stopped in the past due to hypothyroidism, but he is on thyroid replacement now.  LFTs and TSH stable 04/2016.   He will need regular followup with an eye doctor while on amiodarone. Check LFTs and TSH today.  - Continue warfarin. Follows at coumadin clinic. 3.  CKD: Creatinine baseline 1.8-2.1. It was up to 4.63 today (had been several months since checked).  See med changes above. He will need to see his nephrologist.  4.  PAD:  Followed by [facility].  Marca Anconaalton Arley Salamone 12/07/2016 11:26 PM

## 2016-12-07 NOTE — Patient Instructions (Signed)
Increase Coreg to 12.5 mg (1 Tab) Two Times daily with meals  Labs today (will call for abnormal results, otherwise no news is good news)  Follow up in 3 months

## 2016-12-08 ENCOUNTER — Telehealth (HOSPITAL_COMMUNITY): Payer: Self-pay

## 2016-12-08 MED ORDER — TORSEMIDE 20 MG PO TABS: 20.0000 mg | ORAL_TABLET | Freq: Every day | ORAL | 2 refills | Status: DC

## 2016-12-08 NOTE — Telephone Encounter (Signed)
TSH  Order: [id number]  Status:  Final result Visible to patient:  Yes (MyChart) Dx:  Chronic atrial fibrillation (HCC); We...  Notes Recorded by Chyrl Civatte, RN on 12/08/2016 at 10:46 AM EST Results reviewed with patient. Patient asking to review with wife who manages his medications. Patient is not active with a nephrologist at this time per his report, although Dr. Alford Highland last note states he is followed by Dr. Lynnell Chad. Will refer him to [facility] attn: Dr. Lowell Guitar. Attempted to call wife on mobile # listed in chart per patient request to go over med changes and 1 week bmet apt, no answer, no VM available to leave message. Will attempt again later today. ------  Notes Recorded by Laurey Morale, MD on 12/07/2016 at 11:06 PM EST Stop KCl, stop spironolactone, stop torsemide. Stay off torsemide for 4 days, then resume at 20 mg daily. Needs BMET in 1 week. Needs followup with nephrology.

## 2016-12-08 NOTE — Telephone Encounter (Signed)
TSH  Order: [id number]  Status:  Final result Visible to patient:  Yes (MyChart) Dx:  Chronic atrial fibrillation (HCC); We...  Notes Recorded by Chyrl Civatte, RN on 12/08/2016 at 4:20 PM EST Wife aware and agreeable to all changes as stated below. BMET recheck scheduled for next Thursday. Rx's updated in chart and sent to preferred pharmacy electronically.  Ave Filter, RN  ------  Notes Recorded by Chyrl Civatte, RN on 12/08/2016 at 10:46 AM EST Results reviewed with patient. Patient asking to review with wife who manages his medications. Patient is not active with a nephrologist at this time per his report. Will refer him to [facility]. Attempted to call wife on mobile # listed in chart per patient request to go over med changes and 1 week bmet apt, no answer, no VM available to leave message. Will attempt again later today. ------  Notes Recorded by Laurey Morale, MD on 12/07/2016 at 11:06 PM EST Stop KCl, stop spironolactone, stop torsemide. Stay off torsemide for 4 days, then resume at 20 mg daily. Needs BMET in 1 week. Needs followup with nephrology.

## 2016-12-10 ENCOUNTER — Other Ambulatory Visit: Payer: Self-pay | Admitting: Internal Medicine

## 2016-12-16 ENCOUNTER — Ambulatory Visit (HOSPITAL_COMMUNITY)
Admission: RE | Admit: 2016-12-16 | Discharge: 2016-12-16 | Disposition: A | Discharge status: Home / Self Care | Payer: Medicare Other | Source: Ambulatory Visit | Attending: Cardiology | Admitting: Cardiology

## 2016-12-16 DIAGNOSIS — I509 Heart failure, unspecified: Secondary | ICD-10-CM | POA: Diagnosis not present

## 2016-12-16 DIAGNOSIS — I5023 Acute on chronic systolic (congestive) heart failure: Secondary | ICD-10-CM | POA: Diagnosis not present

## 2016-12-16 LAB — BASIC METABOLIC PANEL
Anion gap: 7 (ref 5–15)
BUN: 39 mg/dL — AB (ref 6–20)
CALCIUM: 9.1 mg/dL (ref 8.9–10.3)
CO2: 26 mmol/L (ref 22–32)
CREATININE: 3.2 mg/dL — AB (ref 0.61–1.24)
Chloride: 112 mmol/L — ABNORMAL HIGH (ref 101–111)
GFR calc Af Amer: 20 mL/min — ABNORMAL LOW (ref >60)
GFR, EST NON AFRICAN AMERICAN: 17 mL/min — AB (ref >60)
Glucose, Bld: 97 mg/dL (ref 65–99)
Potassium: 4.2 mmol/L (ref 3.5–5.1)
SODIUM: 145 mmol/L (ref 135–145)

## 2016-12-18 ENCOUNTER — Other Ambulatory Visit: Payer: Self-pay | Admitting: Internal Medicine

## 2016-12-24 ENCOUNTER — Other Ambulatory Visit: Payer: Self-pay | Admitting: Internal Medicine

## 2016-12-28 ENCOUNTER — Encounter: Payer: Self-pay | Admitting: Internal Medicine

## 2016-12-28 ENCOUNTER — Ambulatory Visit (INDEPENDENT_AMBULATORY_CARE_PROVIDER_SITE_OTHER): Payer: Medicare Other | Admitting: Internal Medicine

## 2016-12-28 DIAGNOSIS — I482 Chronic atrial fibrillation, unspecified: Secondary | ICD-10-CM

## 2016-12-28 DIAGNOSIS — I6350 Cerebral infarction due to unspecified occlusion or stenosis of unspecified cerebral artery: Secondary | ICD-10-CM | POA: Diagnosis not present

## 2016-12-28 DIAGNOSIS — E039 Hypothyroidism, unspecified: Secondary | ICD-10-CM | POA: Diagnosis not present

## 2016-12-28 DIAGNOSIS — E1122 Type 2 diabetes mellitus with diabetic chronic kidney disease: Secondary | ICD-10-CM | POA: Diagnosis not present

## 2016-12-28 DIAGNOSIS — E538 Deficiency of other specified B group vitamins: Secondary | ICD-10-CM

## 2016-12-28 DIAGNOSIS — G301 Alzheimer's disease with late onset: Secondary | ICD-10-CM

## 2016-12-28 DIAGNOSIS — F0281 Dementia in other diseases classified elsewhere with behavioral disturbance: Secondary | ICD-10-CM

## 2016-12-28 DIAGNOSIS — N184 Chronic kidney disease, stage 4 (severe): Secondary | ICD-10-CM

## 2016-12-28 NOTE — Assessment & Plan Note (Addendum)
Amiodarone and Coumadin

## 2016-12-28 NOTE — Assessment & Plan Note (Signed)
Stable

## 2016-12-28 NOTE — Assessment & Plan Note (Signed)
On Glipizide 

## 2016-12-28 NOTE — Progress Notes (Signed)
Subjective:  Patient ID: Frank Mclean, male    DOB: 1941/03/18  Age: 76 y.o. MRN: [id number]  CC: No chief complaint on file.   HPI HOOPER GRIESEL presents for CHF, HTN, hypothyroidism f/u  Outpatient Medications Prior to Visit  Medication Sig Dispense Refill  . acetaminophen (TYLENOL) 325 MG tablet Take 1 tablet (325 mg total) by mouth every 6 (six) hours as needed for mild pain (or Fever >/= 101).    [name] amiodarone (PACERONE) 200 MG tablet Take 200 mg by mouth daily.    [name] amLODipine (NORVASC) 5 MG tablet Take 5 mg by mouth daily.    [name] atorvastatin (LIPITOR) 20 MG tablet Take 1 tablet (20 mg total) by mouth daily. 90 tablet 3  . budesonide-formoterol (SYMBICORT) 160-4.5 MCG/ACT inhaler INHALE 2 PUFFS INTO THE LUNGS 2 TIMES DAILY 10.2 Inhaler 3  . carvedilol (COREG) 12.5 MG tablet Take 1 tablet (12.5 mg total) by mouth 2 (two) times daily with a meal. 60 tablet 3  . Cholecalciferol (VITAMIN D-3 PO) Take 1,000 Units by mouth daily.    . [facility] VITAMIN B12 1000 MCG tablet TAKE 1 TABLET BY MOUTH DAILY 200 tablet 1  . escitalopram (LEXAPRO) 5 MG tablet TAKE 1 TABLET BY MOUTH EVERY DAY 90 tablet 3  . ferrous sulfate 325 (65 FE) MG tablet Take 1 tablet (325 mg total) by mouth daily. 30 tablet 11  . hydrALAZINE (APRESOLINE) 50 MG tablet TAKE 1 TABLET (50 MG TOTAL) BY MOUTH 3 (THREE) TIMES DAILY. 90 tablet 3  . isosorbide mononitrate (IMDUR) 30 MG 24 hr tablet Take 1 tablet (30 mg total) by mouth 2 (two) times daily. 60 tablet 6  . KLOR-CON M20 20 MEQ tablet TAKE 2 TABLETS (40 MEQ TOTAL) BY MOUTH 2 (TWO) TIMES DAILY. (Patient taking differently: Take 20 mEq by mouth daily. ) 120 tablet 3  . Menthol, Topical Analgesic, (BIOFREEZE) 4 % GEL Apply topically 2 (two) times daily. Apply to left shoulder and left ankle    . OXYGEN Inhale 2.5 L/min into the lungs at bedtime. Reported on 05/05/2016    . SYNTHROID 100 MCG tablet Take 2 tablets by mouth every morning.  11  . torsemide (DEMADEX) 20 MG tablet Take 1  tablet (20 mg total) by mouth daily. 120 tablet 2  . triamcinolone cream (KENALOG) 0.5 % Apply 1 application topically 2 (two) times daily as needed (itching).    . warfarin (COUMADIN) 5 MG tablet TAKE 1 TABLET BY MOUTH DAILY OR AS DIRECTED BY COUMADIN CLINIC. 30 tablet 1   No facility-administered medications prior to visit.     ROS Review of Systems  Constitutional: Positive for fatigue. Negative for appetite change and unexpected weight change.  HENT: Negative for congestion, nosebleeds, sneezing, sore throat and trouble swallowing.   Eyes: Negative for itching and visual disturbance.  Respiratory: Positive for shortness of breath. Negative for cough.   Cardiovascular: Negative for chest pain, palpitations and leg swelling.  Gastrointestinal: Negative for abdominal distention, blood in stool, diarrhea and nausea.  Genitourinary: Negative for frequency and hematuria.  Musculoskeletal: Positive for gait problem. Negative for back pain, joint swelling and neck pain.  Skin: Negative for rash.  Neurological: Negative for dizziness, tremors, speech difficulty and weakness.  Psychiatric/Behavioral: Negative for agitation, dysphoric mood and sleep disturbance. The patient is not nervous/anxious.     Objective:  BP 120/80   Pulse 70   Wt 201 lb (91.2 kg)   SpO2 95%   BMI 25.81  kg/m   BP Readings from Last 3 Encounters:  12/28/16 120/80  12/07/16 110/62  09/07/16 111/71    Wt Readings from Last 3 Encounters:  12/28/16 201 lb (91.2 kg)  12/07/16 198 lb 6.4 oz (90 kg)  09/07/16 196 lb 8 oz (89.1 kg)    Physical Exam  Constitutional: He is oriented to person, place, and time. He appears well-developed. No distress.  NAD  HENT:  Mouth/Throat: Oropharynx is clear and moist.  Eyes: Conjunctivae are normal. Pupils are equal, round, and reactive to light.  Neck: Normal range of motion. No JVD present. No thyromegaly present.  Cardiovascular: Normal rate, regular rhythm, normal heart  sounds and intact distal pulses.  Exam reveals no gallop and no friction rub.   No murmur heard. Pulmonary/Chest: Effort normal and breath sounds normal. No respiratory distress. He has no wheezes. He has no rales. He exhibits no tenderness.  Abdominal: Soft. Bowel sounds are normal. He exhibits no distension and no mass. There is no tenderness. There is no rebound and no guarding.  Musculoskeletal: Normal range of motion. He exhibits no edema or tenderness.  Lymphadenopathy:    He has no cervical adenopathy.  Neurological: He is alert and oriented to person, place, and time. He has normal reflexes. No cranial nerve deficit. He exhibits normal muscle tone. He displays a negative Romberg sign. Coordination abnormal. Gait normal.  Skin: Skin is warm and dry. No rash noted.  Psychiatric: He has a normal mood and affect. His behavior is normal. Judgment and thought content normal.  Cane Looks tired  Lab Results  Component Value Date   WBC 8.9 12/07/2016   HGB 13.6 12/07/2016   HCT 42.1 12/07/2016   PLT 200 12/07/2016   GLUCOSE 97 12/16/2016   CHOL 97 03/19/2016   TRIG 101 03/19/2016   HDL 25 (A) 03/19/2016   LDLDIRECT 177.9 06/17/2010   LDLCALC 52 03/19/2016   ALT 23 12/07/2016   AST 25 12/07/2016   NA 145 12/16/2016   K 4.2 12/16/2016   CL 112 (H) 12/16/2016   CREATININE 3.20 (H) 12/16/2016   BUN 39 (H) 12/16/2016   CO2 26 12/16/2016   TSH 0.647 12/07/2016   PSA 0.01 (L) 11/18/2011   INR 2.1 09/07/2016   HGBA1C 6.1 04/22/2016    No results found.  Assessment & Plan:   There are no diagnoses linked to this encounter. I am having Mr. Forrester maintain his OXYGEN, ferrous sulfate, acetaminophen, Cholecalciferol (VITAMIN D-3 PO), Menthol (Topical Analgesic), SYNTHROID, amiodarone, escitalopram, triamcinolone cream, atorvastatin, isosorbide mononitrate, amLODipine, KLOR-CON M20, hydrALAZINE, warfarin, carvedilol, torsemide, CVS VITAMIN B12, and budesonide-formoterol.  No orders of  the defined types were placed in this encounter.    Follow-up: No Follow-up on file.  [name], MD

## 2016-12-28 NOTE — Assessment & Plan Note (Signed)
Off O2 x23mo - pt's choice

## 2016-12-28 NOTE — Assessment & Plan Note (Signed)
No relapse Coreg, Coumadin, Lipitor

## 2016-12-28 NOTE — Assessment & Plan Note (Signed)
Levothroid 

## 2016-12-28 NOTE — Progress Notes (Signed)
Pre visit review using our clinic review tool, if applicable. No additional management support is needed unless otherwise documented below in the visit note. 

## 2016-12-28 NOTE — Assessment & Plan Note (Signed)
On B12 

## 2016-12-29 ENCOUNTER — Encounter: Payer: Self-pay | Admitting: Podiatry

## 2016-12-29 ENCOUNTER — Ambulatory Visit (INDEPENDENT_AMBULATORY_CARE_PROVIDER_SITE_OTHER): Payer: Medicare Other | Admitting: Podiatry

## 2016-12-29 DIAGNOSIS — M79676 Pain in unspecified toe(s): Secondary | ICD-10-CM | POA: Diagnosis not present

## 2016-12-29 DIAGNOSIS — E1142 Type 2 diabetes mellitus with diabetic polyneuropathy: Secondary | ICD-10-CM

## 2016-12-29 DIAGNOSIS — B351 Tinea unguium: Secondary | ICD-10-CM | POA: Diagnosis not present

## 2016-12-29 NOTE — Progress Notes (Signed)
   Subjective:    Patient ID: Frank Mclean, male    DOB: 1941/07/12, 76 y.o.   MRN: [id number]  HPI   I am here to get my toenails trimmed and they are thick and long and some what discolored    Review of Systems     Objective:   Physical Exam        Assessment & Plan:

## 2016-12-29 NOTE — Progress Notes (Signed)
Patient ID: Frank Mclean, male   DOB: June 30, 1941, 76 y.o.   MRN: [id number]    Subjective: This patient presents today again for complaining of thickened and elongated toenails which are uncomfortable when walking wearing shoes and her request toenail debridement  Objective: Orientated 3  DP and PT pulses nonpalpable bilaterally Delayed bilaterally Sensation to 10 g monofilament wire intact 2/5 right 3/5 left Vibratory sensation nonreactive bilaterally Ankle reflex reactive bilaterally No open skin lesions bilaterally The toenails are hypertrophic, deformed, brittle, elongated and tender direct palpation 6-10 Pes planus bilaterally HAV bilaterally Hammertoe second bilaterally  Assessment: Symptomatic onychomycoses 6-10 Diabetic with peripheral arterial disease Diabetic with a history of claudication and diabetic foot ulcerations Diabetic peripheral neuropathy  Plan: Debridement toenails 6-10 mechanically and electrically without any bleeding  Reappoint 3 months

## 2016-12-29 NOTE — Patient Instructions (Signed)

## 2017-01-20 ENCOUNTER — Other Ambulatory Visit: Payer: Self-pay | Admitting: Internal Medicine

## 2017-01-25 ENCOUNTER — Other Ambulatory Visit: Payer: Self-pay | Admitting: Cardiology

## 2017-01-25 NOTE — Telephone Encounter (Signed)
Pt last INR check 08/2016, Overdue for follow-up.  Pt has appt for INR check tomorrow 01/26/17, ONLY refill rx x 1 month supply, pt is a flight risk.

## 2017-01-26 ENCOUNTER — Ambulatory Visit (INDEPENDENT_AMBULATORY_CARE_PROVIDER_SITE_OTHER): Payer: Medicare Other | Admitting: *Deleted

## 2017-01-26 DIAGNOSIS — Z7901 Long term (current) use of anticoagulants: Secondary | ICD-10-CM | POA: Diagnosis not present

## 2017-01-26 DIAGNOSIS — Z5181 Encounter for therapeutic drug level monitoring: Secondary | ICD-10-CM

## 2017-01-26 DIAGNOSIS — I6350 Cerebral infarction due to unspecified occlusion or stenosis of unspecified cerebral artery: Secondary | ICD-10-CM | POA: Diagnosis not present

## 2017-01-26 LAB — POCT INR: INR: 3.1

## 2017-02-04 ENCOUNTER — Ambulatory Visit (HOSPITAL_COMMUNITY)
Admission: RE | Admit: 2017-02-04 | Discharge: 2017-02-04 | Disposition: A | Discharge status: Home / Self Care | Payer: Medicare Other | Source: Ambulatory Visit | Attending: Cardiology | Admitting: Cardiology

## 2017-02-04 VITALS — BP 80/50 | HR 68 | Wt 206.0 lb

## 2017-02-04 DIAGNOSIS — E1142 Type 2 diabetes mellitus with diabetic polyneuropathy: Secondary | ICD-10-CM | POA: Diagnosis not present

## 2017-02-04 DIAGNOSIS — I130 Hypertensive heart and chronic kidney disease with heart failure and stage 1 through stage 4 chronic kidney disease, or unspecified chronic kidney disease: Secondary | ICD-10-CM | POA: Insufficient documentation

## 2017-02-04 DIAGNOSIS — Z7901 Long term (current) use of anticoagulants: Secondary | ICD-10-CM | POA: Diagnosis not present

## 2017-02-04 DIAGNOSIS — E039 Hypothyroidism, unspecified: Secondary | ICD-10-CM | POA: Insufficient documentation

## 2017-02-04 DIAGNOSIS — I2510 Atherosclerotic heart disease of native coronary artery without angina pectoris: Secondary | ICD-10-CM | POA: Diagnosis not present

## 2017-02-04 DIAGNOSIS — N184 Chronic kidney disease, stage 4 (severe): Secondary | ICD-10-CM | POA: Diagnosis not present

## 2017-02-04 DIAGNOSIS — I480 Paroxysmal atrial fibrillation: Secondary | ICD-10-CM | POA: Diagnosis not present

## 2017-02-04 DIAGNOSIS — I447 Left bundle-branch block, unspecified: Secondary | ICD-10-CM | POA: Diagnosis not present

## 2017-02-04 DIAGNOSIS — Z79899 Other long term (current) drug therapy: Secondary | ICD-10-CM | POA: Diagnosis not present

## 2017-02-04 DIAGNOSIS — R5382 Chronic fatigue, unspecified: Secondary | ICD-10-CM | POA: Diagnosis not present

## 2017-02-04 DIAGNOSIS — I5022 Chronic systolic (congestive) heart failure: Secondary | ICD-10-CM | POA: Diagnosis not present

## 2017-02-04 DIAGNOSIS — E1151 Type 2 diabetes mellitus with diabetic peripheral angiopathy without gangrene: Secondary | ICD-10-CM | POA: Insufficient documentation

## 2017-02-04 DIAGNOSIS — I428 Other cardiomyopathies: Secondary | ICD-10-CM | POA: Insufficient documentation

## 2017-02-04 DIAGNOSIS — E785 Hyperlipidemia, unspecified: Secondary | ICD-10-CM | POA: Insufficient documentation

## 2017-02-04 DIAGNOSIS — E1122 Type 2 diabetes mellitus with diabetic chronic kidney disease: Secondary | ICD-10-CM | POA: Insufficient documentation

## 2017-02-04 LAB — COMPREHENSIVE METABOLIC PANEL
ALBUMIN: 3.5 g/dL (ref 3.5–5.0)
ALT: 21 U/L (ref 17–63)
AST: 21 U/L (ref 15–41)
Alkaline Phosphatase: 99 U/L (ref 38–126)
Anion gap: 8 (ref 5–15)
BUN: 22 mg/dL — AB (ref 6–20)
CHLORIDE: 112 mmol/L — AB (ref 101–111)
CO2: 25 mmol/L (ref 22–32)
CREATININE: 2.85 mg/dL — AB (ref 0.61–1.24)
Calcium: 8.8 mg/dL — ABNORMAL LOW (ref 8.9–10.3)
GFR calc Af Amer: 23 mL/min — ABNORMAL LOW (ref >60)
GFR, EST NON AFRICAN AMERICAN: 20 mL/min — AB (ref >60)
GLUCOSE: 103 mg/dL — AB (ref 65–99)
POTASSIUM: 3.9 mmol/L (ref 3.5–5.1)
SODIUM: 145 mmol/L (ref 135–145)
Total Bilirubin: 1.1 mg/dL (ref 0.3–1.2)
Total Protein: 5.9 g/dL — ABNORMAL LOW (ref 6.5–8.1)

## 2017-02-04 LAB — CBC
HEMATOCRIT: 33 % — AB (ref 39.0–52.0)
Hemoglobin: 10 g/dL — ABNORMAL LOW (ref 13.0–17.0)
MCH: 26.7 pg (ref 26.0–34.0)
MCHC: 30.3 g/dL (ref 30.0–36.0)
MCV: 88.2 fL (ref 78.0–100.0)
PLATELETS: 176 10*3/uL (ref 150–400)
RBC: 3.74 MIL/uL — ABNORMAL LOW (ref 4.22–5.81)
RDW: 16 % — AB (ref 11.5–15.5)
WBC: 7.1 10*3/uL (ref 4.0–10.5)

## 2017-02-04 LAB — BRAIN NATRIURETIC PEPTIDE: B Natriuretic Peptide: 581.8 pg/mL — ABNORMAL HIGH (ref 0.0–100.0)

## 2017-02-04 MED ORDER — HYDRALAZINE HCL 25 MG PO TABS: 25.0000 mg | ORAL_TABLET | Freq: Three times a day (TID) | ORAL | 6 refills | Status: DC

## 2017-02-04 MED ORDER — CARVEDILOL 6.25 MG PO TABS: 6.2500 mg | ORAL_TABLET | Freq: Two times a day (BID) | ORAL | 6 refills | Status: DC

## 2017-02-04 MED ORDER — TORSEMIDE 20 MG PO TABS: 40.0000 mg | ORAL_TABLET | Freq: Every day | ORAL | 2 refills | Status: DC

## 2017-02-04 MED ORDER — ISOSORBIDE MONONITRATE ER 30 MG PO TB24: 30.0000 mg | ORAL_TABLET | Freq: Every day | ORAL | 6 refills | Status: AC

## 2017-02-04 NOTE — Patient Instructions (Addendum)
HOLD Coreg, Hydralazine, Imdur today.  STOP Amlodipine.  DECREASE Imdur to 30 mg once daily.  DECREASE Hydralazine to 25 mg once daily. Can half your current 50 mg tablets, new rx has been sent for 25 mg tablets.  DECREASE Coreg to 6.25 mg twice daily. Can half your current 12.5 mg tablets, new rx has been sent for 6.25 mg tablets.  INCREASE Torsemide to 40 mg (2 tabs) once daily.  Routine lab work today. Will notify you of abnormal results, otherwise no news is good news!  Follow up next week with Otilio Saber PA-C. Appointment info highlighted on next page.  Will order overnight oximetry test to ensure your oxygen stays at a safe level during your sleep. [facility] will contact you to set this up in your home.  Do the following things EVERYDAY: 1) Weigh yourself in the morning before breakfast. Write it down and keep it in a log. 2) Take your medicines as prescribed 3) Eat low salt foods-Limit salt (sodium) to 2000 mg per day.  4) Stay as active as you can everyday 5) Limit all fluids for the day to less than 2 liters

## 2017-02-05 NOTE — Progress Notes (Signed)
Patient ID: Frank Mclean, male   DOB: [birth year], 76 y.o.   MRN: [id number]    [facility] Note   PCP:  [name], MD  Electrophysiologist:  Dr. Lewayne Bunting  HF Cardiology: Dr Shirlee Latch   History of Present Illness: Frank Mclean is a 76 y.o. 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   He was seen in the ER on 06/09/15 with dyspnea and LE edema at which time Hgb was 8.6 (runs 8-10 range), Cr 1.85 (most recently 1.93), BNP 384. CXR had shown small left pleural effusion and COPD. He was noted to have wheezing and rhonchi. He was treated with IV Lasix.    In 9/16, LV epicardial lead was placed.    He was admitted in 3/17 with hypernatremia, UTI, and worsening dementia.  He was seen by Tereso Newcomer in 4/17 and was noted to be in atrial fibrillation with increased weight.  He was admitted and diuresed.  He was cardioverted to NSR.  Last echo in 4/17 showed EF 20-25%.   At last appointment, creatinine was up so torsemide cut back to 20 mg daily.  Since then, weight is up 8 lbs.  He is short of breath walking around the house, this has been worse over the last couple of months.  No chest pain.  BP is running low, 80s/50s today.  He has had no falls but says he occasionally feels lightheaded with standing.  No palpitations.  Denies orthopnea/PND.   We checked oxygen saturation with ambulation today, was 94% on room air.   Optivol interrogated: Fluid index > threshold with decreasing impedance. No AT/AF.  >99% BiV pacing.   Labs (7/15): LDL 186 Labs (2/16): K 4.9, creatinine 2.91 => 2.62, proBNP 432, HCT 34.2 Labs (7/16): creatinine 2.0, K 4.1 Labs (5/17): K 3.6 =>3.1, Na 150=>148, creatinine 1.97=>2.34, HCT 33.3, TSH normal, BNP 824 Labs  (6/17): BNP 369, K 4.3, creatinine 2.84 Labs (9/17): K 4.3, creatinine 2.79 Labs (12/17): K 5.4 => 4.2, creatinine 4.63 => 3.2, LFTs normal  PMH: 1. CKD: Stage IV.  Followed by Dr Lowell Guitar. 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 He is now off amiodarone.  3. 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 He had cardiac cath in 4/12 with mild nonobstructive disease. Echo (6/14) with EF 25-30%, prominent apical trabeculations, diffuse hypokinesis, mild MR. Lexiscan Cardiolite in 1/16 showed a large, severe primarily fixed inferior and apical perfusion defect with EF 25%, suggestive of prior infarct with mild peri-infarct ischemia.  RHC (2/16) with mean RA 6, PA 62/32 mean 46, PCWP 30, CI 1.81.  Echo (3/16) with EF 30-35%, diffuse hypokinesis, mildly dilated RV with moderately decreased systolic function, moderate MR, PASP 54 mmHg. Medtronic ICD (3/16), unable to place LV lead.  - 9/16 epicardial lead placed.  - Echo (4/17) with EF 20-25%, moderate LV dilation, moderately dilated RV with mildly decreased systolic function, moderate to severe TR.  4. CVA: 2004.  5. HTN 6. Gout 7. Hyperlipidemia 8. BPH 9. H/o LBBB 10. Atrial fibrillation: Paroxysmal. DCCV in 4/17.  11. PAD: Has been followed by [facility] (Dr Darrick Penna). History of foot ulcers, now healed.  12. Anemia: Suspect related to CKD.  13. Dementia  SH: Married, lives in [place], never smoked, no ETOH.   FH: Father lived into  his 90s, no significant cardiac disease.   ROS: All systems reviewed and negative except as per HPI.   Current Outpatient Prescriptions  Medication Sig Dispense Refill  . acetaminophen (TYLENOL) 325 MG tablet Take 1 tablet (325 mg total) by mouth every 6 (six) hours as needed for mild pain (or Fever >/= 101).    [name] amiodarone (PACERONE) 200 MG tablet Take 200 mg by mouth daily.    [name] atorvastatin (LIPITOR) 20 MG tablet Take 1 tablet (20 mg total) by mouth daily. 90 tablet 3  . budesonide-formoterol  (SYMBICORT) 160-4.5 MCG/ACT inhaler INHALE 2 PUFFS INTO THE LUNGS 2 TIMES DAILY 10.2 Inhaler 3  . carvedilol (COREG) 6.25 MG tablet Take 1 tablet (6.25 mg total) by mouth 2 (two) times daily with a meal. 60 tablet 6  . Cholecalciferol (VITAMIN D-3 PO) Take 1,000 Units by mouth daily.    . [facility]S VITAMIN B12 1000 MCG tablet TAKE 1 TABLET BY MOUTH DAILY 200 tablet 1  . escitalopram (LEXAPRO) 5 MG tablet TAKE 1 TABLET BY MOUTH EVERY DAY 90 tablet 3  . ferrous sulfate 325 (65 FE) MG tablet Take 1 tablet (325 mg total) by mouth daily. 30 tablet 11  . hydrALAZINE (APRESOLINE) 25 MG tablet Take 1 tablet (25 mg total) by mouth 3 (three) times daily. 90 tablet 6  . isosorbide mononitrate (IMDUR) 30 MG 24 hr tablet Take 1 tablet (30 mg total) by mouth daily. 30 tablet 6  . KLOR-CON M20 20 MEQ tablet TAKE 2 TABLETS (40 MEQ TOTAL) BY MOUTH 2 (TWO) TIMES DAILY. (Patient taking differently: Take 20 mEq by mouth daily. ) 120 tablet 3  . Menthol, Topical Analgesic, (BIOFREEZE) 4 % GEL Apply topically 2 (two) times daily. Apply to left shoulder and left ankle    . OXYGEN Inhale 2.5 L/min into the lungs at bedtime. Reported on 05/05/2016    . SYNTHROID 100 MCG tablet Take 2 tablets by mouth every morning.  11  . triamcinolone cream (KENALOG) 0.5 % Apply 1 application topically 2 (two) times daily as needed (itching).    . triamcinolone cream (KENALOG) 0.5 % APPLY TOPICALLY 2 (TWO) TIMES DAILY. 60 g 1  . warfarin (COUMADIN) 5 MG tablet TAKE 1 TABLET BY MOUTH DAILY OR AS DIRECTED BY COUMADIN CLINIC. 30 tablet 0  . torsemide (DEMADEX) 20 MG tablet Take 2 tablets (40 mg total) by mouth daily. 60 tablet 2   No current facility-administered medications for this encounter.   Review of Systems  Cardiovascular: Positive for dyspnea on exertion.  All other systems reviewed and are negative.    PHYSICAL EXAM: VS:  BP (!) 80/50 (BP Location: Right Arm, Patient Position: Sitting, Cuff Size: Normal)   Pulse 68   Wt 206 lb  (93.4 kg)   SpO2 90%   BMI 26.45 kg/m     Wt Readings from Last 3 Encounters:  02/04/17 206 lb (93.4 kg)  12/28/16 201 lb (91.2 kg)  12/07/16 198 lb 6.4 oz (90 kg)     GEN: Elderly appearing, NAD.  HEENT: normal   Neck: JVP 8-9 cm Cardiac:  Normal S1/S2, RRR; 2/6 HSM LLSB,  no rubs or gallops, trace ankle edema.    Respiratory:  Decreased breath sounds bilaterally.  GI: soft, nontender, nondistended, + BS MS: no deformity or atrophy  Skin: warm and dry  Neuro:  Alert/oriented x 3, seems to have some memory difficulty.  Psych: Flat affect, not very interactive.    ASSESSMENT AND PLAN: 1.  Chronic systolic CHF: Nonischemic cardiomyopathy. Medtronic CRT-D with epicardial lead. Echo (4/17) with EF 20-25% and moderately decreased RV function.  NYHA class IIIb symptoms.  Volume overloaded by exam and Optivol.  BP is running low and he has some lightheadedness.  - Increase torsemide to 40 mg daily. BMET/BNP today and again in 10 days.  - Do not take any other BP-active meds today.  Stop amlodipine.  Decrease Coreg to 6.25 mg bid, hydralazine to 25 mg tid, and Imdur to 30 mg once daily.  Would like his family to get a BP cuff and follow his readings at home.  - Off spironolactone with elevated K.   2.  PAF:  Maintaining NSR since DCCV. - Continue amiodarone at 200 mg daily.  This was stopped in the past due to hypothyroidism, but he is on thyroid replacement now.  He will need regular followup with an eye doctor while on amiodarone. Check LFTs today.  - Continue warfarin. Follows at coumadin clinic. 3.  CKD: CKD stage IV.  Will get BMET today.  Follow closely.  4.  PAD:  Followed by [facility].  Followup in 7-10 days.   Stephanie Touhey 02/05/2017 4:41 PM

## 2017-02-09 ENCOUNTER — Encounter (HOSPITAL_COMMUNITY): Payer: Medicare Other

## 2017-02-11 ENCOUNTER — Encounter: Payer: Self-pay | Admitting: Cardiology

## 2017-02-17 ENCOUNTER — Other Ambulatory Visit: Payer: Self-pay | Admitting: Internal Medicine

## 2017-02-18 ENCOUNTER — Ambulatory Visit (INDEPENDENT_AMBULATORY_CARE_PROVIDER_SITE_OTHER): Payer: Medicare Other

## 2017-02-18 DIAGNOSIS — Z7901 Long term (current) use of anticoagulants: Secondary | ICD-10-CM

## 2017-02-18 DIAGNOSIS — Z5181 Encounter for therapeutic drug level monitoring: Secondary | ICD-10-CM

## 2017-02-18 DIAGNOSIS — I6350 Cerebral infarction due to unspecified occlusion or stenosis of unspecified cerebral artery: Secondary | ICD-10-CM | POA: Diagnosis not present

## 2017-02-18 LAB — POCT INR: INR: 4.5

## 2017-02-21 ENCOUNTER — Encounter (HOSPITAL_COMMUNITY): Payer: Self-pay

## 2017-02-21 ENCOUNTER — Ambulatory Visit (HOSPITAL_COMMUNITY)
Admission: RE | Admit: 2017-02-21 | Discharge: 2017-02-21 | Disposition: A | Discharge status: Home / Self Care | Payer: Medicare Other | Source: Ambulatory Visit | Attending: Cardiology | Admitting: Cardiology

## 2017-02-21 VITALS — BP 94/62 | HR 66 | Wt 199.6 lb

## 2017-02-21 DIAGNOSIS — Z8673 Personal history of transient ischemic attack (TIA), and cerebral infarction without residual deficits: Secondary | ICD-10-CM | POA: Diagnosis not present

## 2017-02-21 DIAGNOSIS — N183 Chronic kidney disease, stage 3 unspecified: Secondary | ICD-10-CM

## 2017-02-21 DIAGNOSIS — E785 Hyperlipidemia, unspecified: Secondary | ICD-10-CM | POA: Insufficient documentation

## 2017-02-21 DIAGNOSIS — Z7901 Long term (current) use of anticoagulants: Secondary | ICD-10-CM | POA: Diagnosis not present

## 2017-02-21 DIAGNOSIS — I447 Left bundle-branch block, unspecified: Secondary | ICD-10-CM | POA: Diagnosis not present

## 2017-02-21 DIAGNOSIS — N184 Chronic kidney disease, stage 4 (severe): Secondary | ICD-10-CM | POA: Diagnosis not present

## 2017-02-21 DIAGNOSIS — I2510 Atherosclerotic heart disease of native coronary artery without angina pectoris: Secondary | ICD-10-CM | POA: Insufficient documentation

## 2017-02-21 DIAGNOSIS — I428 Other cardiomyopathies: Secondary | ICD-10-CM | POA: Insufficient documentation

## 2017-02-21 DIAGNOSIS — I480 Paroxysmal atrial fibrillation: Secondary | ICD-10-CM | POA: Insufficient documentation

## 2017-02-21 DIAGNOSIS — E1122 Type 2 diabetes mellitus with diabetic chronic kidney disease: Secondary | ICD-10-CM | POA: Insufficient documentation

## 2017-02-21 DIAGNOSIS — E1142 Type 2 diabetes mellitus with diabetic polyneuropathy: Secondary | ICD-10-CM | POA: Insufficient documentation

## 2017-02-21 DIAGNOSIS — Z95810 Presence of automatic (implantable) cardiac defibrillator: Secondary | ICD-10-CM | POA: Insufficient documentation

## 2017-02-21 DIAGNOSIS — I739 Peripheral vascular disease, unspecified: Secondary | ICD-10-CM | POA: Diagnosis not present

## 2017-02-21 DIAGNOSIS — I482 Chronic atrial fibrillation, unspecified: Secondary | ICD-10-CM

## 2017-02-21 DIAGNOSIS — E039 Hypothyroidism, unspecified: Secondary | ICD-10-CM | POA: Insufficient documentation

## 2017-02-21 DIAGNOSIS — I5022 Chronic systolic (congestive) heart failure: Secondary | ICD-10-CM

## 2017-02-21 DIAGNOSIS — I130 Hypertensive heart and chronic kidney disease with heart failure and stage 1 through stage 4 chronic kidney disease, or unspecified chronic kidney disease: Secondary | ICD-10-CM | POA: Insufficient documentation

## 2017-02-21 DIAGNOSIS — Z79899 Other long term (current) drug therapy: Secondary | ICD-10-CM | POA: Insufficient documentation

## 2017-02-21 LAB — BASIC METABOLIC PANEL
ANION GAP: 7 (ref 5–15)
BUN: 32 mg/dL — ABNORMAL HIGH (ref 6–20)
CHLORIDE: 113 mmol/L — AB (ref 101–111)
CO2: 27 mmol/L (ref 22–32)
CREATININE: 2.94 mg/dL — AB (ref 0.61–1.24)
Calcium: 9.1 mg/dL (ref 8.9–10.3)
GFR calc Af Amer: 22 mL/min — ABNORMAL LOW (ref >60)
GFR calc non Af Amer: 19 mL/min — ABNORMAL LOW (ref >60)
Glucose, Bld: 112 mg/dL — ABNORMAL HIGH (ref 65–99)
Potassium: 3.7 mmol/L (ref 3.5–5.1)
Sodium: 147 mmol/L — ABNORMAL HIGH (ref 135–145)

## 2017-02-21 MED ORDER — TORSEMIDE 20 MG PO TABS: ORAL_TABLET | ORAL | 3 refills | Status: DC

## 2017-02-21 MED ORDER — HYDRALAZINE HCL 25 MG PO TABS: 25.0000 mg | ORAL_TABLET | Freq: Two times a day (BID) | ORAL | 3 refills | Status: DC

## 2017-02-21 NOTE — Patient Instructions (Addendum)
Labs today (will call for abnormal results, otherwise no news is good news)  INCREASE Hydralazine to 25 mg (1 Tablet ) Two Times Daily  INCREASE torsemide to 40 mg (2 Tablets) in the AM and 20 mg (1Tablet) in the PM.  Echo has been ordered for you.  Home Health Physical Therapy has been ordered for you, they will contact you for the initial meeting.  Follow up appointment is scheduled for you on March 19th @ 10:20.

## 2017-02-21 NOTE — Progress Notes (Signed)
Patient ID: Frank Mclean, male   DOB: 1941/03/07, 76 y.o.   MRN: [id number]    Advanced Heart Failure Clinic Note   PCP:  [name], MD  Electrophysiologist:  Dr. Lewayne Bunting  HF Cardiology: Dr Shirlee Latch   History of Present Illness: Frank Mclean is a 76 y.o. 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   He was seen in the ER on 06/09/15 with dyspnea and LE edema at which time Hgb was 8.6 (runs 8-10 range), Cr 1.85 (most recently 1.93), BNP 384. CXR had shown small left pleural effusion and COPD. He was noted to have wheezing and rhonchi. He was treated with IV Lasix.    In 9/16, LV epicardial lead was placed.    He was admitted in 3/17 with hypernatremia, UTI, and worsening dementia.  He was seen by Tereso Newcomer in 4/17 and was noted to be in atrial fibrillation with increased weight.  He was admitted and diuresed.  He was cardioverted to NSR.  Last echo in 4/17 showed EF 20-25%.   Pt presents today for follow up. At last appointment was having soft pressures and lightheadedness. Coreg, Imdur, and Hydralazine all held x 1 day and then decreased.  Taking torsemide 40 mg daily. Hasn't needed any extra. Gets slightly short of breathing changing clothes and bathing. He is only taking hydralazine 25 mg once daily. Has not had overnight oximetry performed yet.   Optivol interrogated: Thoracic impedence below threshold. Fluid index markedly elevated. Pt activity < 30 minutes per day.   Labs (7/15): LDL 186 Labs (2/16): K 4.9, creatinine 2.91 => 2.62, proBNP 432, HCT 34.2 Labs (7/16): creatinine 2.0, K 4.1 Labs (5/17): K 3.6 =>3.1, Na 150=>148, creatinine 1.97=>2.34, HCT 33.3, TSH normal, BNP 824 Labs (6/17): BNP 369, K 4.3, creatinine 2.84 Labs (9/17): K  4.3, creatinine 2.79 Labs (12/17): K 5.4 => 4.2, creatinine 4.63 => 3.2, LFTs normal  PMH: 1. CKD: Stage IV.  Followed by Dr Lowell Guitar. 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 He is now off amiodarone.  3. 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 He had cardiac cath in 4/12 with mild nonobstructive disease. Echo (6/14) with EF 25-30%, prominent apical trabeculations, diffuse hypokinesis, mild MR. Lexiscan Cardiolite in 1/16 showed a large, severe primarily fixed inferior and apical perfusion defect with EF 25%, suggestive of prior infarct with mild peri-infarct ischemia.  RHC (2/16) with mean RA 6, PA 62/32 mean 46, PCWP 30, CI 1.81.  Echo (3/16) with EF 30-35%, diffuse hypokinesis, mildly dilated RV with moderately decreased systolic function, moderate MR, PASP 54 mmHg. Medtronic ICD (3/16), unable to place LV lead.  - 9/16 epicardial lead placed.  - Echo (4/17) with EF 20-25%, moderate LV dilation, moderately dilated RV with mildly decreased systolic function, moderate to severe TR.  4. CVA: 2004.  5. HTN 6. Gout 7. Hyperlipidemia 8. BPH 9. H/o LBBB 10. Atrial fibrillation: Paroxysmal. DCCV in 4/17.  11. PAD: Has been followed by [facility] (Dr Darrick Penna). History of foot ulcers, now healed.  12. Anemia: Suspect related to CKD.  13. Dementia  SH: Married, lives in [place], never smoked, no ETOH.   FH: Father lived into his 27s, no significant cardiac disease.   ROS: All systems reviewed and negative except as per HPI.   Current Outpatient Prescriptions  Medication Sig Dispense Refill  . acetaminophen (TYLENOL) 325 MG tablet Take 1 tablet (325 mg total) by mouth every 6 (six) hours as needed for mild pain (or Fever >/= 101).    [name] amiodarone (PACERONE) 200 MG tablet Take 200 mg by mouth daily.    [name] atorvastatin (LIPITOR) 20 MG tablet Take 1 tablet (20 mg total) by mouth daily. 90 tablet 3  . budesonide-formoterol (SYMBICORT) 160-4.5 MCG/ACT inhaler INHALE 2 PUFFS INTO THE  LUNGS 2 TIMES DAILY 10.2 Inhaler 3  . carvedilol (COREG) 6.25 MG tablet Take 1 tablet (6.25 mg total) by mouth 2 (two) times daily with a meal. 60 tablet 6  . Cholecalciferol (VITAMIN D-3 PO) Take 1,000 Units by mouth daily.    . CVS VITAMIN B12 1000 MCG tablet TAKE 1 TABLET BY MOUTH DAILY 200 tablet 1  . [facility]S VITAMIN D3 1000 units capsule TAKE 1 TABLET (1,000 UNITS TOTAL) BY MOUTH DAILY. 90 capsule 1  . escitalopram (LEXAPRO) 5 MG tablet TAKE 1 TABLET BY MOUTH EVERY DAY 90 tablet 3  . ferrous sulfate 325 (65 FE) MG tablet TAKE 1 TABLET (325 MG TOTAL) BY MOUTH DAILY. 90 tablet 1  . hydrALAZINE (APRESOLINE) 25 MG tablet Take 25 mg by mouth daily.    . isosorbide mononitrate (IMDUR) 30 MG 24 hr tablet Take 1 tablet (30 mg total) by mouth daily. 30 tablet 6  . Menthol, Topical Analgesic, (BIOFREEZE) 4 % GEL Apply topically 2 (two) times daily. Apply to left shoulder and left ankle    . OXYGEN Inhale 2.5 L/min into the lungs at bedtime. Reported on 05/05/2016    . potassium chloride SA (K-DUR,KLOR-CON) 20 MEQ tablet Take 20 mEq by mouth daily.    [name] SYNTHROID 100 MCG tablet Take 2 tablets by mouth every morning.  11  . torsemide (DEMADEX) 20 MG tablet Take 2 tablets (40 mg total) by mouth daily. 60 tablet 2  . triamcinolone cream (KENALOG) 0.5 % Apply 1 application topically 2 (two) times daily as needed (itching).    . triamcinolone cream (KENALOG) 0.5 % APPLY TOPICALLY 2 (TWO) TIMES DAILY. 60 g 1  . warfarin (COUMADIN) 5 MG tablet TAKE 1 TABLET BY MOUTH DAILY OR AS DIRECTED BY COUMADIN CLINIC. 30 tablet 0   No current facility-administered medications for this encounter.      PHYSICAL EXAM: VS:  BP 94/62 (BP Location: Left Arm, Patient Position: Sitting, Cuff Size: Normal)   Pulse 66   Wt 199 lb 9.6 oz (90.5 kg)   SpO2 100%   BMI 25.63 kg/m     Wt Readings from Last 3 Encounters:  02/21/17 199 lb 9.6 oz (90.5 kg)  02/04/17 206 lb (93.4 kg)  12/28/16 201 lb (91.2 kg)     GEN: Elderly  appearing. NAD. In WC.   HEENT: Normal Neck: JVP 7-8 cm.  Cardiac:  Normal S1/S2, RRR; 2/6 HSM LLSB,  no rubs or gallops, trace ankle edema.    Respiratory:  CTAB, normal effort.   GI: soft, NT, ND, no HSM. No bruits or masses. +BS  MS: No deformity or atrophy  Skin: Warm and dry Neuro:  Alert/oriented x 3, seems to have some memory difficulty.  Psych: Flat affect.    ASSESSMENT AND PLAN: 1.  Chronic systolic CHF: Nonischemic cardiomyopathy. Medtronic CRT-D with epicardial lead. Echo (4/17) with EF 20-25% and moderately decreased RV function.  NYHA class IIIb symptoms.  Volume overloaded by exam and Optivol.  BP is running low and he  has some lightheadedness.  - Discussed with Dr. Shirlee Latch.  Increase torsemide 40 mg q am and 20 mg q pm. BMET today. - Continue coreg 6.25 mg BID.  - Increase hydralazine to 25 mg BID. Take second dose at bedtime.  - Continue imdur 30 mg daily.  - Off spironolactone with elevated K.   - Reinforced fluid restriction to < 2 L daily, sodium restriction to less than 2000 mg daily, and the importance of daily weights.   2.  PAF:  Maintaining NSR since DCCV. - Continue amiodarone at 200 mg daily.  This was stopped in the past due to hypothyroidism, but he is on thyroid replacement now.  He will need regular followup with an eye doctor while on amiodarone. LFTs stable last visit.  - Continue warfarin. Follows at coumadin clinic. 3.  CKD: CKD stage IV.   - BMET today. Follow closely with recent AKI.   4.  PAD: - Followed by [facility]. - No change to current plan.   Increase torsemide and hydralazine as above.  Follow BMET closely. Pt has 2 week follow up. Will keep this in place for now.   Graciella Freer, PA-C  02/21/2017 2:18 PM  Total time spent > 25 minutes. Over half that spent discussing the above.

## 2017-02-22 ENCOUNTER — Other Ambulatory Visit: Payer: Self-pay | Admitting: Cardiology

## 2017-03-02 ENCOUNTER — Other Ambulatory Visit: Payer: Self-pay | Admitting: Cardiology

## 2017-03-03 ENCOUNTER — Ambulatory Visit (HOSPITAL_COMMUNITY)
Admission: RE | Admit: 2017-03-03 | Discharge: 2017-03-03 | Disposition: A | Discharge status: Home / Self Care | Payer: Medicare Other | Source: Ambulatory Visit | Attending: Student | Admitting: Student

## 2017-03-03 DIAGNOSIS — I517 Cardiomegaly: Secondary | ICD-10-CM | POA: Diagnosis not present

## 2017-03-03 DIAGNOSIS — I428 Other cardiomyopathies: Secondary | ICD-10-CM | POA: Diagnosis not present

## 2017-03-03 DIAGNOSIS — I340 Nonrheumatic mitral (valve) insufficiency: Secondary | ICD-10-CM | POA: Insufficient documentation

## 2017-03-03 DIAGNOSIS — I77810 Thoracic aortic ectasia: Secondary | ICD-10-CM | POA: Diagnosis not present

## 2017-03-03 DIAGNOSIS — E119 Type 2 diabetes mellitus without complications: Secondary | ICD-10-CM | POA: Insufficient documentation

## 2017-03-03 DIAGNOSIS — I361 Nonrheumatic tricuspid (valve) insufficiency: Secondary | ICD-10-CM | POA: Diagnosis not present

## 2017-03-03 DIAGNOSIS — E785 Hyperlipidemia, unspecified: Secondary | ICD-10-CM | POA: Insufficient documentation

## 2017-03-03 DIAGNOSIS — I509 Heart failure, unspecified: Secondary | ICD-10-CM | POA: Diagnosis present

## 2017-03-03 DIAGNOSIS — I5022 Chronic systolic (congestive) heart failure: Secondary | ICD-10-CM | POA: Diagnosis not present

## 2017-03-03 DIAGNOSIS — J449 Chronic obstructive pulmonary disease, unspecified: Secondary | ICD-10-CM | POA: Diagnosis not present

## 2017-03-03 NOTE — Progress Notes (Signed)
  Echocardiogram 2D Echocardiogram has been performed.  Frank Mclean 03/03/2017, 3:59 PM

## 2017-03-04 ENCOUNTER — Ambulatory Visit (INDEPENDENT_AMBULATORY_CARE_PROVIDER_SITE_OTHER): Payer: Medicare Other | Admitting: *Deleted

## 2017-03-04 DIAGNOSIS — Z5181 Encounter for therapeutic drug level monitoring: Secondary | ICD-10-CM | POA: Diagnosis not present

## 2017-03-04 DIAGNOSIS — I6350 Cerebral infarction due to unspecified occlusion or stenosis of unspecified cerebral artery: Secondary | ICD-10-CM | POA: Diagnosis not present

## 2017-03-04 DIAGNOSIS — Z7901 Long term (current) use of anticoagulants: Secondary | ICD-10-CM | POA: Diagnosis not present

## 2017-03-04 LAB — POCT INR: INR: 3.4

## 2017-03-04 MED ORDER — WARFARIN SODIUM 5 MG PO TABS: ORAL_TABLET | ORAL | 1 refills | Status: DC

## 2017-03-07 ENCOUNTER — Ambulatory Visit (HOSPITAL_COMMUNITY)
Admission: RE | Admit: 2017-03-07 | Discharge: 2017-03-07 | Disposition: A | Discharge status: Home / Self Care | Payer: Medicare Other | Source: Ambulatory Visit | Attending: Cardiology | Admitting: Cardiology

## 2017-03-07 ENCOUNTER — Encounter (HOSPITAL_COMMUNITY): Payer: Self-pay

## 2017-03-07 VITALS — BP 123/93 | HR 69 | Wt 191.5 lb

## 2017-03-07 DIAGNOSIS — I130 Hypertensive heart and chronic kidney disease with heart failure and stage 1 through stage 4 chronic kidney disease, or unspecified chronic kidney disease: Secondary | ICD-10-CM | POA: Insufficient documentation

## 2017-03-07 DIAGNOSIS — I739 Peripheral vascular disease, unspecified: Secondary | ICD-10-CM | POA: Diagnosis not present

## 2017-03-07 DIAGNOSIS — E785 Hyperlipidemia, unspecified: Secondary | ICD-10-CM | POA: Diagnosis not present

## 2017-03-07 DIAGNOSIS — I5022 Chronic systolic (congestive) heart failure: Secondary | ICD-10-CM | POA: Insufficient documentation

## 2017-03-07 DIAGNOSIS — Z8673 Personal history of transient ischemic attack (TIA), and cerebral infarction without residual deficits: Secondary | ICD-10-CM | POA: Diagnosis not present

## 2017-03-07 DIAGNOSIS — I2510 Atherosclerotic heart disease of native coronary artery without angina pectoris: Secondary | ICD-10-CM | POA: Insufficient documentation

## 2017-03-07 DIAGNOSIS — I447 Left bundle-branch block, unspecified: Secondary | ICD-10-CM | POA: Diagnosis not present

## 2017-03-07 DIAGNOSIS — N184 Chronic kidney disease, stage 4 (severe): Secondary | ICD-10-CM | POA: Diagnosis not present

## 2017-03-07 DIAGNOSIS — I480 Paroxysmal atrial fibrillation: Secondary | ICD-10-CM | POA: Diagnosis not present

## 2017-03-07 DIAGNOSIS — I428 Other cardiomyopathies: Secondary | ICD-10-CM | POA: Insufficient documentation

## 2017-03-07 DIAGNOSIS — Z79899 Other long term (current) drug therapy: Secondary | ICD-10-CM | POA: Insufficient documentation

## 2017-03-07 DIAGNOSIS — Z95810 Presence of automatic (implantable) cardiac defibrillator: Secondary | ICD-10-CM | POA: Insufficient documentation

## 2017-03-07 DIAGNOSIS — E784 Other hyperlipidemia: Secondary | ICD-10-CM

## 2017-03-07 DIAGNOSIS — E039 Hypothyroidism, unspecified: Secondary | ICD-10-CM | POA: Insufficient documentation

## 2017-03-07 DIAGNOSIS — Z7901 Long term (current) use of anticoagulants: Secondary | ICD-10-CM | POA: Diagnosis not present

## 2017-03-07 DIAGNOSIS — E7849 Other hyperlipidemia: Secondary | ICD-10-CM

## 2017-03-07 LAB — COMPREHENSIVE METABOLIC PANEL
ALBUMIN: 3.6 g/dL (ref 3.5–5.0)
ALK PHOS: 104 U/L (ref 38–126)
ALT: 16 U/L — ABNORMAL LOW (ref 17–63)
ANION GAP: 12 (ref 5–15)
AST: 20 U/L (ref 15–41)
BILIRUBIN TOTAL: 0.7 mg/dL (ref 0.3–1.2)
BUN: 28 mg/dL — ABNORMAL HIGH (ref 6–20)
CALCIUM: 8.8 mg/dL — AB (ref 8.9–10.3)
CO2: 24 mmol/L (ref 22–32)
CREATININE: 3.13 mg/dL — AB (ref 0.61–1.24)
Chloride: 107 mmol/L (ref 101–111)
GFR calc non Af Amer: 18 mL/min — ABNORMAL LOW (ref >60)
GFR, EST AFRICAN AMERICAN: 21 mL/min — AB (ref >60)
GLUCOSE: 140 mg/dL — AB (ref 65–99)
Potassium: 3.6 mmol/L (ref 3.5–5.1)
SODIUM: 143 mmol/L (ref 135–145)
TOTAL PROTEIN: 6.2 g/dL — AB (ref 6.5–8.1)

## 2017-03-07 LAB — LIPID PANEL
Cholesterol: 134 mg/dL (ref 0–200)
HDL: 43 mg/dL (ref >40)
LDL Cholesterol: 75 mg/dL (ref 0–99)
Total CHOL/HDL Ratio: 3.1 RATIO
Triglycerides: 79 mg/dL (ref <150)
VLDL: 16 mg/dL (ref 0–40)

## 2017-03-07 MED ORDER — HYDRALAZINE HCL 25 MG PO TABS: 37.5000 mg | ORAL_TABLET | Freq: Two times a day (BID) | ORAL | 3 refills | Status: DC

## 2017-03-07 NOTE — Patient Instructions (Signed)
Increase Hydralazine to 37.5 mg (1 & 1/2 tabs) Twice daily   Labs today  Your physician recommends that you schedule a follow-up appointment in: 2 months

## 2017-03-07 NOTE — Progress Notes (Signed)
Patient ID: Frank Mclean, male   DOB: 01/19/1941, 76 y.o.   MRN: [id number]    Advanced Heart Failure Clinic Note   PCP:  [name], MD  Electrophysiologist:  Dr. Lewayne Bunting  HF Cardiology: Dr Shirlee Latch   History of Present Illness: Frank Mclean is a 76 y.o. 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   He was seen in the ER on 06/09/15 with dyspnea and LE edema at which time Hgb was 8.6 (runs 8-10 range), Cr 1.85 (most recently 1.93), BNP 384. CXR had shown small left pleural effusion and COPD. He was noted to have wheezing and rhonchi. He was treated with IV Lasix.    In 9/16, LV epicardial lead was placed.    He was admitted in 3/17 with hypernatremia, UTI, and worsening dementia.  He was seen by Tereso Newcomer in 4/17 and was noted to be in atrial fibrillation with increased weight.  He was admitted and diuresed.  He was cardioverted to NSR.    Last echo in 3/18 showed EF 15-20% with moderately dilated and dysfunctional RV.   At prior appointments, torsemide was titrated up due to volume overload.  Weight is down 8 lbs today.  He feels better.  At last appointment, he had to come in in a wheelchair.  Today, he walked in on his own.  He is short of breath after walking about 75 yards (this is improved).  No chest pain.  No leg pain, but legs are weak generally.  No orthopnea/PND.  Only able to remember to take hydralazine twice a day.   Optivol interrogated: Fluid index > threshold but decreasing and impedance increasing. No AT/AF.  >99% BiV pacing.   Labs (7/15): LDL 186 Labs (2/16): K 4.9, creatinine 2.91 => 2.62, proBNP 432, HCT 34.2 Labs (7/16): creatinine 2.0, K 4.1 Labs (5/17): K 3.6 =>3.1, Na 150=>148, creatinine 1.97=>2.34, HCT 33.3, TSH  normal, BNP 824 Labs (6/17): BNP 369, K 4.3, creatinine 2.84 Labs (9/17): K 4.3, creatinine 2.79 Labs (12/17): K 5.4 => 4.2, creatinine 4.63 => 3.2, LFTs normal Labs (3/18): K 3.7, creatinine 2.94  PMH: 1. CKD: Stage IV.  Followed by Dr Lowell Guitar. 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 He is now off amiodarone.  3. 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 He had cardiac cath in 4/12 with mild nonobstructive disease. Echo (6/14) with EF 25-30%, prominent apical trabeculations, diffuse hypokinesis, mild MR. Lexiscan Cardiolite in 1/16 showed a large, severe primarily fixed inferior and apical perfusion defect with EF 25%, suggestive of prior infarct with mild peri-infarct ischemia.  RHC (2/16) with mean RA 6, PA 62/32 mean 46, PCWP 30, CI 1.81.  Echo (3/16) with EF 30-35%, diffuse hypokinesis, mildly dilated RV with moderately decreased systolic function, moderate MR, PASP 54 mmHg. Medtronic ICD (3/16), unable to place LV lead.  - 9/16 epicardial lead placed.  - Echo (4/17) with EF 20-25%, moderate LV dilation, moderately dilated RV with mildly decreased systolic function, moderate to severe TR.  - Echo (3/18): EF 15-20%, mild LV dilation, moderate LVH, moderately dilated and moderately dysfunctional RV, mild TR, PASP 53 mmHg.  4. CVA: 2004.  5. HTN 6. Gout 7. Hyperlipidemia 8. BPH 9. H/o LBBB 10. Atrial fibrillation: Paroxysmal. DCCV in 4/17.  11. PAD: Has been followed by [facility] (  Dr Darrick Penna). History of foot ulcers, now healed.  12. Anemia: Suspect related to CKD.  13. Dementia  SH: Mar[place], [place], never smoked, no ETOH.   FH: Father lived into his 72s, no significant cardiac disease.   ROS: All systems reviewed and negative except as per HPI.   Current Outpatient Prescriptions  Medication Sig Dispense Refill  . acetaminophen (TYLENOL) 325 MG tablet Take 1 tablet (325 mg total) by mouth every 6 (six) hours as needed for mild pain (or Fever >/= 101).    [name]  amiodarone (PACERONE) 200 MG tablet Take 200 mg by mouth daily.    [name] atorvastatin (LIPITOR) 20 MG tablet Take 1 tablet (20 mg total) by mouth daily. 90 tablet 3  . budesonide-formoterol (SYMBICORT) 160-4.5 MCG/ACT inhaler INHALE 2 PUFFS INTO THE LUNGS 2 TIMES DAILY 10.2 Inhaler 3  . carvedilol (COREG) 6.25 MG tablet Take 1 tablet (6.25 mg total) by mouth 2 (two) times daily with a meal. 60 tablet 6  . Cholecalciferol (VITAMIN D-3 PO) Take 1,000 Units by mouth daily.    . [facility]S VITAMIN B12 1000 MCG tablet TAKE 1 TABLET BY MOUTH DAILY 200 tablet 1  . [facility]S VITAMIN D3 1000 units capsule TAKE 1 TABLET (1,000 UNITS TOTAL) BY MOUTH DAILY. 90 capsule 1  . escitalopram (LEXAPRO) 5 MG tablet TAKE 1 TABLET BY MOUTH EVERY DAY 90 tablet 3  . ferrous sulfate 325 (65 FE) MG tablet TAKE 1 TABLET (325 MG TOTAL) BY MOUTH DAILY. 90 tablet 1  . hydrALAZINE (APRESOLINE) 25 MG tablet Take 1.5 tablets (37.5 mg total) by mouth 2 (two) times daily. 90 tablet 3  . isosorbide mononitrate (IMDUR) 30 MG 24 hr tablet Take 1 tablet (30 mg total) by mouth daily. 30 tablet 6  . Menthol, Topical Analgesic, (BIOFREEZE) 4 % GEL Apply topically 2 (two) times daily. Apply to left shoulder and left ankle    . potassium chloride SA (K-DUR,KLOR-CON) 20 MEQ tablet Take 20 mEq by mouth daily.    [name] SYNTHROID 100 MCG tablet Take 2 tablets by mouth every morning.  11  . torsemide (DEMADEX) 20 MG tablet Take 40 mg (2 Tablets) in the AM and 20 mg (1 Tablet) in the PM 90 tablet 3  . triamcinolone cream (KENALOG) 0.5 % Apply 1 application topically 2 (two) times daily as needed (itching).    . warfarin (COUMADIN) 5 MG tablet TAKE 1 TABLET BY MOUTH DAILY OR AS DIRECTED BY COUMADIN CLINIC. 30 tablet 1   No current facility-administered medications for this encounter.   Review of Systems  Cardiovascular: Positive for dyspnea on exertion.  All other systems reviewed and are negative.    PHYSICAL EXAM: VS:  BP (!) 123/93   Pulse 69   Wt 191 lb 8  oz (86.9 kg)   SpO2 100%   BMI 24.59 kg/m     Wt Readings from Last 3 Encounters:  03/07/17 191 lb 8 oz (86.9 kg)  02/21/17 199 lb 9.6 oz (90.5 kg)  02/04/17 206 lb (93.4 kg)     GEN: Elderly appearing, NAD.  HEENT: normal   Neck: JVP 7 cm Cardiac:  Normal S1/S2, RRR; 2/6 HSM LLSB,  no rubs or gallops, No edema. GI: soft, nontender, nondistended, + BS MS: no deformity or atrophy  Skin: warm and dry  Neuro:  Alert/oriented x 3, seems to have some memory difficulty.  Psych: Flat affect, not very interactive.    ASSESSMENT AND PLAN: 1.  Chronic systolic CHF:  Nonischemic cardiomyopathy. Medtronic CRT-D with epicardial lead. Echo (3/18) with EF 15-20%% and moderately decreased RV function.  NYHA class III symptoms, somewhat improved.  Volume status looks better by exam and Optivol. - Continue torsemide 40 qam/20 qpm.  BMET today.  - Continue Coreg 6.25 mg bid. - Increase hydralazine to 37.5 mg bid (can only remember to take twice a day) and continue Imdur 30 daily.  - Off spironolactone with elevated K, no ARB/ACEI/ARNI with elevated creatinine.   2.  PAF:  Maintaining NSR since DCCV. - Continue amiodarone at 200 mg daily.  This was stopped in the past due to hypothyroidism, but he is on thyroid replacement now.  He will need regular followup with an eye doctor while on amiodarone. Check LFTs today.  - Continue warfarin. Follows at coumadin clinic. 3.  CKD: CKD stage IV.  Will get BMET today.  Follow closely.  4.  PAD:  Followed by [facility].  Followup in 2 months.   Stpehen Trojanowski 03/07/2017 10:49 PM

## 2017-03-14 ENCOUNTER — Other Ambulatory Visit: Payer: Self-pay | Admitting: Cardiology

## 2017-03-23 ENCOUNTER — Ambulatory Visit (INDEPENDENT_AMBULATORY_CARE_PROVIDER_SITE_OTHER): Payer: Medicare Other | Admitting: *Deleted

## 2017-03-23 DIAGNOSIS — I6350 Cerebral infarction due to unspecified occlusion or stenosis of unspecified cerebral artery: Secondary | ICD-10-CM

## 2017-03-23 DIAGNOSIS — Z7901 Long term (current) use of anticoagulants: Secondary | ICD-10-CM

## 2017-03-23 DIAGNOSIS — Z5181 Encounter for therapeutic drug level monitoring: Secondary | ICD-10-CM

## 2017-03-23 LAB — POCT INR: INR: 4.5

## 2017-03-28 ENCOUNTER — Ambulatory Visit: Payer: Medicare Other | Admitting: Internal Medicine

## 2017-03-30 ENCOUNTER — Ambulatory Visit: Payer: Medicare Other | Admitting: Podiatry

## 2017-04-11 ENCOUNTER — Other Ambulatory Visit: Payer: Self-pay | Admitting: *Deleted

## 2017-04-11 NOTE — Telephone Encounter (Signed)
Pharmacy requests ninety day. 

## 2017-04-11 NOTE — Telephone Encounter (Signed)
Rec'd fax pt requesting refill on amlodipine 90 day script. Per chart med was discontinued back in march. [facility].../lmb

## 2017-04-12 ENCOUNTER — Other Ambulatory Visit (HOSPITAL_COMMUNITY): Payer: Self-pay | Admitting: Cardiology

## 2017-04-12 ENCOUNTER — Other Ambulatory Visit: Payer: Self-pay | Admitting: *Deleted

## 2017-04-12 ENCOUNTER — Ambulatory Visit: Payer: Medicare Other | Admitting: Internal Medicine

## 2017-04-12 MED ORDER — CARVEDILOL 6.25 MG PO TABS
6.2500 mg | ORAL_TABLET | Freq: Two times a day (BID) | ORAL | 3 refills | Status: DC
Start: 2017-04-12 — End: 2017-08-12

## 2017-04-12 NOTE — Telephone Encounter (Signed)
No to 90 day supply, pt is overdue for follow-up, and a flight risk.

## 2017-04-13 ENCOUNTER — Other Ambulatory Visit (HOSPITAL_COMMUNITY): Payer: Self-pay | Admitting: Cardiology

## 2017-04-13 MED ORDER — HYDRALAZINE HCL 25 MG PO TABS: 37.5000 mg | ORAL_TABLET | Freq: Two times a day (BID) | ORAL | 3 refills | Status: DC

## 2017-04-13 MED ORDER — POTASSIUM CHLORIDE CRYS ER 20 MEQ PO TBCR: 20.0000 meq | EXTENDED_RELEASE_TABLET | Freq: Every day | ORAL | 3 refills | Status: DC

## 2017-04-17 ENCOUNTER — Other Ambulatory Visit: Payer: Self-pay | Admitting: Internal Medicine

## 2017-04-22 ENCOUNTER — Ambulatory Visit (INDEPENDENT_AMBULATORY_CARE_PROVIDER_SITE_OTHER): Payer: Medicare Other | Admitting: Pharmacist

## 2017-04-22 DIAGNOSIS — Z5181 Encounter for therapeutic drug level monitoring: Secondary | ICD-10-CM

## 2017-04-22 DIAGNOSIS — Z7901 Long term (current) use of anticoagulants: Secondary | ICD-10-CM | POA: Diagnosis not present

## 2017-04-22 DIAGNOSIS — I6350 Cerebral infarction due to unspecified occlusion or stenosis of unspecified cerebral artery: Secondary | ICD-10-CM | POA: Diagnosis not present

## 2017-04-22 LAB — PROTIME-INR
INR: 4.7 — ABNORMAL HIGH (ref 0.8–1.2)
PROTHROMBIN TIME: 47.2 s — AB (ref 9.1–12.0)

## 2017-04-22 LAB — POCT INR: INR: 6.5

## 2017-04-22 MED ORDER — WARFARIN SODIUM 5 MG PO TABS: ORAL_TABLET | ORAL | 1 refills | Status: DC

## 2017-04-29 ENCOUNTER — Ambulatory Visit (INDEPENDENT_AMBULATORY_CARE_PROVIDER_SITE_OTHER): Payer: Medicare Other | Admitting: *Deleted

## 2017-04-29 DIAGNOSIS — Z5181 Encounter for therapeutic drug level monitoring: Secondary | ICD-10-CM | POA: Diagnosis not present

## 2017-04-29 DIAGNOSIS — Z7901 Long term (current) use of anticoagulants: Secondary | ICD-10-CM | POA: Diagnosis not present

## 2017-04-29 DIAGNOSIS — I6350 Cerebral infarction due to unspecified occlusion or stenosis of unspecified cerebral artery: Secondary | ICD-10-CM

## 2017-04-29 LAB — POCT INR: INR: 1.5

## 2017-05-09 ENCOUNTER — Ambulatory Visit (INDEPENDENT_AMBULATORY_CARE_PROVIDER_SITE_OTHER): Payer: Medicare Other

## 2017-05-09 ENCOUNTER — Ambulatory Visit (HOSPITAL_COMMUNITY)
Admission: RE | Admit: 2017-05-09 | Discharge: 2017-05-09 | Disposition: A | Discharge status: Home / Self Care | Payer: Medicare Other | Source: Ambulatory Visit | Attending: Cardiology | Admitting: Cardiology

## 2017-05-09 ENCOUNTER — Encounter (HOSPITAL_COMMUNITY): Payer: Self-pay

## 2017-05-09 VITALS — BP 122/76 | HR 69 | Wt 193.2 lb

## 2017-05-09 DIAGNOSIS — I480 Paroxysmal atrial fibrillation: Secondary | ICD-10-CM | POA: Diagnosis not present

## 2017-05-09 DIAGNOSIS — Z95810 Presence of automatic (implantable) cardiac defibrillator: Secondary | ICD-10-CM

## 2017-05-09 DIAGNOSIS — I447 Left bundle-branch block, unspecified: Secondary | ICD-10-CM | POA: Insufficient documentation

## 2017-05-09 DIAGNOSIS — N184 Chronic kidney disease, stage 4 (severe): Secondary | ICD-10-CM | POA: Diagnosis not present

## 2017-05-09 DIAGNOSIS — E1122 Type 2 diabetes mellitus with diabetic chronic kidney disease: Secondary | ICD-10-CM | POA: Diagnosis not present

## 2017-05-09 DIAGNOSIS — Z8673 Personal history of transient ischemic attack (TIA), and cerebral infarction without residual deficits: Secondary | ICD-10-CM | POA: Insufficient documentation

## 2017-05-09 DIAGNOSIS — I130 Hypertensive heart and chronic kidney disease with heart failure and stage 1 through stage 4 chronic kidney disease, or unspecified chronic kidney disease: Secondary | ICD-10-CM | POA: Insufficient documentation

## 2017-05-09 DIAGNOSIS — I5022 Chronic systolic (congestive) heart failure: Secondary | ICD-10-CM | POA: Insufficient documentation

## 2017-05-09 DIAGNOSIS — I428 Other cardiomyopathies: Secondary | ICD-10-CM | POA: Diagnosis not present

## 2017-05-09 DIAGNOSIS — I6350 Cerebral infarction due to unspecified occlusion or stenosis of unspecified cerebral artery: Secondary | ICD-10-CM | POA: Diagnosis not present

## 2017-05-09 DIAGNOSIS — Z79899 Other long term (current) drug therapy: Secondary | ICD-10-CM | POA: Diagnosis not present

## 2017-05-09 DIAGNOSIS — I2510 Atherosclerotic heart disease of native coronary artery without angina pectoris: Secondary | ICD-10-CM | POA: Diagnosis not present

## 2017-05-09 DIAGNOSIS — Z7901 Long term (current) use of anticoagulants: Secondary | ICD-10-CM | POA: Insufficient documentation

## 2017-05-09 DIAGNOSIS — E1151 Type 2 diabetes mellitus with diabetic peripheral angiopathy without gangrene: Secondary | ICD-10-CM | POA: Diagnosis not present

## 2017-05-09 DIAGNOSIS — E039 Hypothyroidism, unspecified: Secondary | ICD-10-CM | POA: Insufficient documentation

## 2017-05-09 DIAGNOSIS — E785 Hyperlipidemia, unspecified: Secondary | ICD-10-CM | POA: Insufficient documentation

## 2017-05-09 DIAGNOSIS — Z5181 Encounter for therapeutic drug level monitoring: Secondary | ICD-10-CM

## 2017-05-09 LAB — BASIC METABOLIC PANEL
Anion gap: 10 (ref 5–15)
BUN: 26 mg/dL — AB (ref 6–20)
CHLORIDE: 112 mmol/L — AB (ref 101–111)
CO2: 25 mmol/L (ref 22–32)
Calcium: 9.1 mg/dL (ref 8.9–10.3)
Creatinine, Ser: 2.71 mg/dL — ABNORMAL HIGH (ref 0.61–1.24)
GFR calc Af Amer: 24 mL/min — ABNORMAL LOW (ref >60)
GFR calc non Af Amer: 21 mL/min — ABNORMAL LOW (ref >60)
GLUCOSE: 102 mg/dL — AB (ref 65–99)
POTASSIUM: 3.9 mmol/L (ref 3.5–5.1)
SODIUM: 147 mmol/L — AB (ref 135–145)

## 2017-05-09 LAB — POCT INR: INR: 1.8

## 2017-05-09 NOTE — Patient Instructions (Signed)
No changes to medication at this time.  Routine lab work today. Will notify you of abnormal results, otherwise no news is good news!  Follow up 4 months with Dr. Shirlee Latch. We will call you closer to this time, or you may call our office to schedule 1 month before you are due to be seen.  Do the following things EVERYDAY: 1) Weigh yourself in the morning before breakfast. Write it down and keep it in a log. 2) Take your medicines as prescribed 3) Eat low salt foods-Limit salt (sodium) to 2000 mg per day.  4) Stay as active as you can everyday 5) Limit all fluids for the day to less than 2 liters

## 2017-05-09 NOTE — Progress Notes (Signed)
Patient ID: Frank Mclean, male   DOB: 01-24-1941, 76 y.o.   MRN: [id number]    Advanced Heart Failure Clinic Note   PCP:  Plotnikov, Georgina Quint, MD  Electrophysiologist:  Dr. Lewayne Bunting  HF Cardiology: Dr Shirlee Latch   History of Present Illness: Frank Mclean is a 76 y.o. 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   He was seen in the ER on 06/09/15 with dyspnea and LE edema at which time Hgb was 8.6 (runs 8-10 range), Cr 1.85 (most recently 1.93), BNP 384. CXR had shown small left pleural effusion and COPD. He was noted to have wheezing and rhonchi. He was treated with IV Lasix.    In 9/16, LV epicardial lead was placed.    He was admitted in 3/17 with hypernatremia, UTI, and worsening dementia.  He was seen by Tereso Newcomer in 4/17 and was noted to be in atrial fibrillation with increased weight.  He was admitted and diuresed.  He was cardioverted to NSR.  Last echo in 4/17 showed EF 20-25%.   He presents today for HF follow up. Overall feeling well, no SOB with walking into clinic, SOB with dressing and eating at times. He is complaining of back pain. Weights at home 190-192 pounds. He is eating a low salt diet, drinking about 5 bottles of water a day. Not active very much at all. Has been missing some doses of medications, especially at night. His wife has started helping him take his meds now.   Optivol interrogated: volume status stable to dry. Activity less than 1 hour a day.   Labs (7/15): LDL 186 Labs (2/16): K 4.9, creatinine 2.91 => 2.62, proBNP 432, HCT 34.2 Labs (7/16): creatinine 2.0, K 4.1 Labs (5/17): K 3.6 =>3.1, Na 150=>148, creatinine 1.97=>2.34, HCT 33.3, TSH normal, BNP 824 Labs (6/17): BNP 369, K 4.3, creatinine 2.84 Labs (9/17): K  4.3, creatinine 2.79 Labs (12/17): K 5.4 => 4.2, creatinine 4.63 => 3.2, LFTs normal  PMH: 1. CKD: Stage IV.  Followed by Dr Lowell Guitar. 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 He is now off amiodarone.  3. 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 He had cardiac cath in 4/12 with mild nonobstructive disease. Echo (6/14) with EF 25-30%, prominent apical trabeculations, diffuse hypokinesis, mild MR. Lexiscan Cardiolite in 1/16 showed a large, severe primarily fixed inferior and apical perfusion defect with EF 25%, suggestive of prior infarct with mild peri-infarct ischemia.  RHC (2/16) with mean RA 6, PA 62/32 mean 46, PCWP 30, CI 1.81.  Echo (3/16) with EF 30-35%, diffuse hypokinesis, mildly dilated RV with moderately decreased systolic function, moderate MR, PASP 54 mmHg. Medtronic ICD (3/16), unable to place LV lead.  - 9/16 epicardial lead placed.  - Echo (4/17) with EF 20-25%, moderate LV dilation, moderately dilated RV with mildly decreased systolic function, moderate to severe TR.  - Echo (3/18) with EF reduced 15-20% 4. CVA: 2004.  5. HTN 6. Gout 7. Hyperlipidemia 8. BPH 9. H/o LBBB 10. Atrial fibrillation: Paroxysmal. DCCV in 4/17.  11. PAD: Has been followed by [facility] (Dr Darrick Penna). History of foot ulcers, now healed.  12. Anemia: Suspect related to CKD.  13. Dementia  SH: Married, lives in [place], never smoked, no ETOH.   FH: Father lived into his 61s, no significant cardiac  disease.   ROS: All systems reviewed and negative except as per HPI.   Current Outpatient Prescriptions  Medication Sig Dispense Refill  . acetaminophen (TYLENOL) 325 MG tablet Take 1 tablet (325 mg total) by mouth every 6 (six) hours as needed for mild pain (or Fever >/= 101).    [name] amiodarone (PACERONE) 200 MG tablet Take 200 mg by mouth daily.    [name] atorvastatin (LIPITOR) 20 MG tablet Take 1 tablet (20 mg total) by mouth daily. 90 tablet 3  . budesonide-formoterol (SYMBICORT) 160-4.5  MCG/ACT inhaler INHALE 2 PUFFS INTO THE LUNGS 2 TIMES DAILY 10.2 Inhaler 3  . carvedilol (COREG) 6.25 MG tablet Take 1 tablet (6.25 mg total) by mouth 2 (two) times daily with a meal. 180 tablet 3  . Cholecalciferol (VITAMIN D-3 PO) Take 1,000 Units by mouth daily.    . [facility]S VITAMIN B12 1000 MCG tablet TAKE 1 TABLET BY MOUTH DAILY 200 tablet 1  . [facility]S VITAMIN D3 1000 units capsule TAKE 1 TABLET (1,000 UNITS TOTAL) BY MOUTH DAILY. 90 capsule 1  . escitalopram (LEXAPRO) 5 MG tablet TAKE 1 TABLET BY MOUTH EVERY DAY 90 tablet 3  . ferrous sulfate 325 (65 FE) MG tablet TAKE 1 TABLET (325 MG TOTAL) BY MOUTH DAILY. 90 tablet 1  . hydrALAZINE (APRESOLINE) 25 MG tablet Take 1.5 tablets (37.5 mg total) by mouth 2 (two) times daily. 270 tablet 3  . isosorbide mononitrate (IMDUR) 30 MG 24 hr tablet Take 1 tablet (30 mg total) by mouth daily. 30 tablet 6  . levothyroxine (SYNTHROID, LEVOTHROID) 100 MCG tablet TAKE 2 TABLETS BY MOUTH EVERY MORNING BEFORE BREAKFAST 60 tablet 5  . Menthol, Topical Analgesic, (BIOFREEZE) 4 % GEL Apply topically 2 (two) times daily. Apply to left shoulder and left ankle    . potassium chloride SA (K-DUR,KLOR-CON) 20 MEQ tablet Take 1 tablet (20 mEq total) by mouth daily. 90 tablet 3  . SYNTHROID 100 MCG tablet Take 2 tablets by mouth every morning.  11  . torsemide (DEMADEX) 20 MG tablet Take 40 mg (2 Tablets) in the AM and 20 mg (1 Tablet) in the PM 90 tablet 3  . triamcinolone cream (KENALOG) 0.5 % Apply 1 application topically 2 (two) times daily as needed (itching).    . warfarin (COUMADIN) 5 MG tablet TAKE 1/2 or 1 TABLET BY MOUTH DAILY OR AS DIRECTED BY COUMADIN CLINIC. 30 tablet 1   No current facility-administered medications for this encounter.      PHYSICAL EXAM: VS:  BP 122/76 (BP Location: Left Arm, Patient Position: Sitting, Cuff Size: Normal)   Pulse 69   Wt 193 lb 3.2 oz (87.6 kg)   SpO2 95%   BMI 24.81 kg/m     Wt Readings from Last 3 Encounters:  05/09/17  193 lb 3.2 oz (87.6 kg)  03/07/17 191 lb 8 oz (86.9 kg)  02/21/17 199 lb 9.6 oz (90.5 kg)     GEN: Elderly appearing male, NAD. Walked into clinic with cane without difficulty.  HEENT: Normal Neck: Supple, 5-6 cm JVD.  Cardiac:  Normal S1/S2, RRR; 2/6 HSM LLSB.  No rubs or gallops. No peripheral edema.  Respiratory: Clear bilaterally, no wheeze. Normal effort.   GI: soft, NT, ND, no HSM. No bruits or masses. + bowel sounds.   MS: No deformity or atrophy  Skin: Warm and dry Neuro:  Alert/oriented x 3. Affect pleasant.  Psych: Flat affect.    ASSESSMENT AND PLAN: 1.  Chronic systolic CHF:  Nonischemic cardiomyopathy. Medtronic CRT-D with epicardial lead. Echo (4/17) with EF 20-25% and moderately decreased RV function.  Echo 02/2017 EF 15-20%.  - NYHA class IIIb symptoms.  - Continue torsemide 40mg  in the am, 20mg  in the pm.  - Continue coreg 6.25 mg BID, will not increase today as he has missed some doses of Coreg this past week.  - Continue hydralazine 37.5mg  BID.  - Continue imdur 30 mg daily.  - No Spiro with history of hyperkalemia.  - No ACE/ARB/ARNI with CKD.    2.  PAF:  Maintaining NSR since DCCV. - Continue amiodarone at 200 mg daily.   - This was stopped in the past due to hypothyroidism, but he is on thyroid replacement now.   - Reminded him that he will need regular followup with an eye doctor while on amiodarone. LFTs stable last visit.  - Continue warfarin. Follows at coumadin clinic. 3.  CKD: CKD stage IV.   - BMET today.  - Baseline 2.8-3.2 4.  PAD: - Followed by [facility]. - No change to current plan.   BMET today. Follow up in 4 months.    Little Ishikawa, NP  05/09/2017 11:39 AM  Total time spent > 25 minutes. Over half that spent discussing the above.

## 2017-05-11 ENCOUNTER — Other Ambulatory Visit (HOSPITAL_COMMUNITY): Payer: Self-pay | Admitting: Student

## 2017-05-18 ENCOUNTER — Ambulatory Visit: Payer: Medicare Other | Admitting: Internal Medicine

## 2017-05-23 ENCOUNTER — Ambulatory Visit (INDEPENDENT_AMBULATORY_CARE_PROVIDER_SITE_OTHER): Payer: Medicare Other | Admitting: *Deleted

## 2017-05-23 DIAGNOSIS — Z5181 Encounter for therapeutic drug level monitoring: Secondary | ICD-10-CM | POA: Diagnosis not present

## 2017-05-23 DIAGNOSIS — Z7901 Long term (current) use of anticoagulants: Secondary | ICD-10-CM | POA: Diagnosis not present

## 2017-05-23 DIAGNOSIS — I6350 Cerebral infarction due to unspecified occlusion or stenosis of unspecified cerebral artery: Secondary | ICD-10-CM | POA: Diagnosis not present

## 2017-05-23 LAB — POCT INR: INR: 1.8

## 2017-05-25 ENCOUNTER — Other Ambulatory Visit (HOSPITAL_COMMUNITY): Payer: Self-pay | Admitting: Cardiology

## 2017-06-17 ENCOUNTER — Other Ambulatory Visit (HOSPITAL_COMMUNITY): Payer: Self-pay | Admitting: Cardiology

## 2017-06-20 ENCOUNTER — Ambulatory Visit (INDEPENDENT_AMBULATORY_CARE_PROVIDER_SITE_OTHER): Payer: Medicare Other | Admitting: Pharmacist

## 2017-06-20 DIAGNOSIS — I6350 Cerebral infarction due to unspecified occlusion or stenosis of unspecified cerebral artery: Secondary | ICD-10-CM

## 2017-06-20 DIAGNOSIS — Z5181 Encounter for therapeutic drug level monitoring: Secondary | ICD-10-CM

## 2017-06-20 DIAGNOSIS — Z7901 Long term (current) use of anticoagulants: Secondary | ICD-10-CM | POA: Diagnosis not present

## 2017-06-20 LAB — POCT INR: INR: 5

## 2017-06-20 MED ORDER — POTASSIUM CHLORIDE CRYS ER 20 MEQ PO TBCR: 20.0000 meq | EXTENDED_RELEASE_TABLET | Freq: Two times a day (BID) | ORAL | 3 refills | Status: AC

## 2017-06-27 ENCOUNTER — Other Ambulatory Visit: Payer: Self-pay | Admitting: Internal Medicine

## 2017-06-27 NOTE — Telephone Encounter (Signed)
Routing to dr plotnikov, please advise, thanks 

## 2017-07-04 ENCOUNTER — Ambulatory Visit (INDEPENDENT_AMBULATORY_CARE_PROVIDER_SITE_OTHER): Payer: Medicare Other | Admitting: *Deleted

## 2017-07-04 DIAGNOSIS — I6350 Cerebral infarction due to unspecified occlusion or stenosis of unspecified cerebral artery: Secondary | ICD-10-CM | POA: Diagnosis not present

## 2017-07-04 DIAGNOSIS — Z5181 Encounter for therapeutic drug level monitoring: Secondary | ICD-10-CM | POA: Diagnosis not present

## 2017-07-04 DIAGNOSIS — Z7901 Long term (current) use of anticoagulants: Secondary | ICD-10-CM | POA: Diagnosis not present

## 2017-07-04 LAB — POCT INR: INR: 3.7

## 2017-07-20 ENCOUNTER — Encounter (HOSPITAL_COMMUNITY): Payer: Self-pay | Admitting: *Deleted

## 2017-07-20 ENCOUNTER — Telehealth (HOSPITAL_COMMUNITY): Payer: Self-pay | Admitting: *Deleted

## 2017-07-20 NOTE — Telephone Encounter (Signed)
Patient's wife called and stated that he has been feeling weak for 3 days but doesn't complain of shortness of breath or weight gain.  She stated that she wanted to check with our office before calling his PCP.  Since patient wasn't having any heart failure symptoms I advised her to contact his PCP.

## 2017-07-27 ENCOUNTER — Ambulatory Visit (INDEPENDENT_AMBULATORY_CARE_PROVIDER_SITE_OTHER): Payer: Medicare Other | Admitting: Podiatry

## 2017-07-27 DIAGNOSIS — I739 Peripheral vascular disease, unspecified: Secondary | ICD-10-CM

## 2017-07-27 DIAGNOSIS — B351 Tinea unguium: Secondary | ICD-10-CM | POA: Diagnosis not present

## 2017-07-27 DIAGNOSIS — E1142 Type 2 diabetes mellitus with diabetic polyneuropathy: Secondary | ICD-10-CM | POA: Diagnosis not present

## 2017-07-27 DIAGNOSIS — M79676 Pain in unspecified toe(s): Secondary | ICD-10-CM

## 2017-07-28 ENCOUNTER — Encounter: Payer: Self-pay | Admitting: Podiatry

## 2017-07-28 NOTE — Patient Instructions (Signed)

## 2017-07-28 NOTE — Progress Notes (Signed)
Patient ID: Frank Mclean, male   DOB: [birth year], 76 y.o.   MRN: [id number]   Subjective: This patient presents today again for complaining of thickened and elongated toenails which are uncomfortable when walking wearing shoes and  request toenail debridement  Objective: Orientated 3  DP and PT pulses nonpalpable bilaterally Delayed bilaterally Sensation to 10 g monofilament wire intact 2/5 right 3/5 left Vibratory sensation nonreactive bilaterally Ankle reflex reactive bilaterally No open skin lesions bilaterally Atrophic skin and absent hair growth bilaterally The toenails are hypertrophic, deformed, brittle, elongated and tender direct palpation 6-10 Pes planus bilaterally HAV bilaterally Hammertoe second bilaterally  Assessment: Symptomatic onychomycoses 6-10 Diabetic with peripheral arterial disease Diabetic with a history of claudication and diabetic foot ulcerations Diabetic peripheral neuropathy  Plan: Debridement toenails 6-10 mechanically and electricallywithout any bleeding  Reappoint 3 months

## 2017-08-06 ENCOUNTER — Other Ambulatory Visit (HOSPITAL_COMMUNITY): Payer: Self-pay | Admitting: Student

## 2017-08-07 ENCOUNTER — Other Ambulatory Visit: Payer: Self-pay | Admitting: Cardiology

## 2017-08-12 ENCOUNTER — Encounter: Payer: Self-pay | Admitting: Internal Medicine

## 2017-08-12 ENCOUNTER — Ambulatory Visit (INDEPENDENT_AMBULATORY_CARE_PROVIDER_SITE_OTHER): Payer: Medicare Other | Admitting: Internal Medicine

## 2017-08-12 ENCOUNTER — Ambulatory Visit (INDEPENDENT_AMBULATORY_CARE_PROVIDER_SITE_OTHER): Payer: Medicare Other | Admitting: *Deleted

## 2017-08-12 ENCOUNTER — Other Ambulatory Visit (INDEPENDENT_AMBULATORY_CARE_PROVIDER_SITE_OTHER): Payer: Medicare Other

## 2017-08-12 VITALS — BP 114/82 | HR 69 | Temp 98.0°F | Ht 74.0 in | Wt 190.0 lb

## 2017-08-12 VITALS — BP 112/80 | HR 70 | Ht 74.0 in | Wt 190.8 lb

## 2017-08-12 DIAGNOSIS — E1122 Type 2 diabetes mellitus with diabetic chronic kidney disease: Secondary | ICD-10-CM

## 2017-08-12 DIAGNOSIS — I6350 Cerebral infarction due to unspecified occlusion or stenosis of unspecified cerebral artery: Secondary | ICD-10-CM

## 2017-08-12 DIAGNOSIS — I480 Paroxysmal atrial fibrillation: Secondary | ICD-10-CM

## 2017-08-12 DIAGNOSIS — Z5181 Encounter for therapeutic drug level monitoring: Secondary | ICD-10-CM

## 2017-08-12 DIAGNOSIS — N183 Chronic kidney disease, stage 3 unspecified: Secondary | ICD-10-CM

## 2017-08-12 DIAGNOSIS — I447 Left bundle-branch block, unspecified: Secondary | ICD-10-CM

## 2017-08-12 DIAGNOSIS — D631 Anemia in chronic kidney disease: Secondary | ICD-10-CM

## 2017-08-12 DIAGNOSIS — F32A Depression, unspecified: Secondary | ICD-10-CM

## 2017-08-12 DIAGNOSIS — E559 Vitamin D deficiency, unspecified: Secondary | ICD-10-CM | POA: Diagnosis not present

## 2017-08-12 DIAGNOSIS — F329 Major depressive disorder, single episode, unspecified: Secondary | ICD-10-CM | POA: Diagnosis not present

## 2017-08-12 DIAGNOSIS — N184 Chronic kidney disease, stage 4 (severe): Secondary | ICD-10-CM

## 2017-08-12 DIAGNOSIS — Z7901 Long term (current) use of anticoagulants: Secondary | ICD-10-CM | POA: Diagnosis not present

## 2017-08-12 DIAGNOSIS — E538 Deficiency of other specified B group vitamins: Secondary | ICD-10-CM | POA: Diagnosis not present

## 2017-08-12 DIAGNOSIS — I428 Other cardiomyopathies: Secondary | ICD-10-CM | POA: Diagnosis not present

## 2017-08-12 DIAGNOSIS — E039 Hypothyroidism, unspecified: Secondary | ICD-10-CM

## 2017-08-12 DIAGNOSIS — I5022 Chronic systolic (congestive) heart failure: Secondary | ICD-10-CM

## 2017-08-12 DIAGNOSIS — B370 Candidal stomatitis: Secondary | ICD-10-CM | POA: Insufficient documentation

## 2017-08-12 DIAGNOSIS — R627 Adult failure to thrive: Secondary | ICD-10-CM | POA: Insufficient documentation

## 2017-08-12 LAB — CUP PACEART INCLINIC DEVICE CHECK
Battery Remaining Longevity: 50 mo
Brady Statistic AP VP Percent: 98.61 %
Brady Statistic AS VP Percent: 0.41 %
Brady Statistic AS VS Percent: 0.1 %
Brady Statistic RV Percent Paced: 88.59 %
HighPow Impedance: 56 Ohm
Implantable Lead Implant Date: 20160323
Implantable Lead Implant Date: 20160323
Implantable Lead Location: 753859
Implantable Lead Location: 753860
Implantable Lead Model: 5071
Implantable Lead Model: 5076
Implantable Lead Model: 6935
Lead Channel Impedance Value: 247 Ohm
Lead Channel Impedance Value: 304 Ohm
Lead Channel Impedance Value: 304 Ohm
Lead Channel Impedance Value: 4047 Ohm
Lead Channel Pacing Threshold Amplitude: 0.75 V
Lead Channel Pacing Threshold Amplitude: 1.625 V
Lead Channel Pacing Threshold Pulse Width: 0.4 ms
Lead Channel Pacing Threshold Pulse Width: 0.4 ms
Lead Channel Sensing Intrinsic Amplitude: 2.25 mV
Lead Channel Setting Pacing Amplitude: 1.75 V
Lead Channel Setting Pacing Amplitude: 2 V
Lead Channel Setting Pacing Pulse Width: 0.4 ms
Lead Channel Setting Sensing Sensitivity: 0.3 mV
MDC IDC LEAD IMPLANT DT: 20160323
MDC IDC LEAD LOCATION: 753858
MDC IDC MSMT BATTERY VOLTAGE: 2.96 V
MDC IDC MSMT LEADCHNL LV IMPEDANCE VALUE: 4047 Ohm
MDC IDC MSMT LEADCHNL RA IMPEDANCE VALUE: 399 Ohm
MDC IDC MSMT LEADCHNL RA SENSING INTR AMPL: 2.25 mV
MDC IDC MSMT LEADCHNL RV PACING THRESHOLD AMPLITUDE: 0.625 V
MDC IDC MSMT LEADCHNL RV PACING THRESHOLD PULSEWIDTH: 0.4 ms
MDC IDC MSMT LEADCHNL RV SENSING INTR AMPL: 12.875 mV
MDC IDC MSMT LEADCHNL RV SENSING INTR AMPL: 12.875 mV
MDC IDC PG IMPLANT DT: 20160323
MDC IDC SESS DTM: 20180824155239
MDC IDC SET LEADCHNL LV PACING AMPLITUDE: 2.75 V
MDC IDC SET LEADCHNL LV PACING PULSEWIDTH: 0.4 ms
MDC IDC STAT BRADY AP VS PERCENT: 0.88 %
MDC IDC STAT BRADY RA PERCENT PACED: 98.8 %

## 2017-08-12 LAB — CBC WITH DIFFERENTIAL/PLATELET
BASOS ABS: 0.1 10*3/uL (ref 0.0–0.1)
Basophils Relative: 0.9 % (ref 0.0–3.0)
EOS ABS: 0.1 10*3/uL (ref 0.0–0.7)
Eosinophils Relative: 0.9 % (ref 0.0–5.0)
HEMATOCRIT: 38.5 % — AB (ref 39.0–52.0)
Hemoglobin: 11.7 g/dL — ABNORMAL LOW (ref 13.0–17.0)
Lymphs Abs: 0.6 10*3/uL — ABNORMAL LOW (ref 0.7–4.0)
MCHC: 30.5 g/dL (ref 30.0–36.0)
MCV: 88.3 fl (ref 78.0–100.0)
MONOS PCT: 4.9 % (ref 3.0–12.0)
Monocytes Absolute: 0.3 10*3/uL (ref 0.1–1.0)
Neutro Abs: 5.9 10*3/uL (ref 1.4–7.7)
Neutrophils Relative %: 85.1 % — ABNORMAL HIGH (ref 43.0–77.0)
Platelets: 164 10*3/uL (ref 150.0–400.0)
RBC: 4.36 Mil/uL (ref 4.22–5.81)
RDW: 19.4 % — ABNORMAL HIGH (ref 11.5–15.5)
WBC: 6.8 10*3/uL (ref 4.0–10.5)

## 2017-08-12 LAB — MAGNESIUM: Magnesium: 2.5 mg/dL (ref 1.5–2.5)

## 2017-08-12 LAB — HEPATIC FUNCTION PANEL
ALBUMIN: 3.8 g/dL (ref 3.5–5.2)
ALK PHOS: 89 U/L (ref 39–117)
ALT: 18 U/L (ref 0–53)
AST: 14 U/L (ref 0–37)
Bilirubin, Direct: 0.4 mg/dL — ABNORMAL HIGH (ref 0.0–0.3)
TOTAL PROTEIN: 6.2 g/dL (ref 6.0–8.3)
Total Bilirubin: 1.2 mg/dL (ref 0.2–1.2)

## 2017-08-12 LAB — BASIC METABOLIC PANEL
BUN: 40 mg/dL — ABNORMAL HIGH (ref 6–23)
CALCIUM: 9.3 mg/dL (ref 8.4–10.5)
CO2: 27 mEq/L (ref 19–32)
CREATININE: 3.71 mg/dL — AB (ref 0.40–1.50)
Chloride: 111 mEq/L (ref 96–112)
GFR: 20.46 mL/min — AB (ref >60.00)
Glucose, Bld: 133 mg/dL — ABNORMAL HIGH (ref 70–99)
Potassium: 4.6 mEq/L (ref 3.5–5.1)
SODIUM: 148 meq/L — AB (ref 135–145)

## 2017-08-12 LAB — SEDIMENTATION RATE: SED RATE: 7 mm/h (ref 0–20)

## 2017-08-12 LAB — POCT INR: INR: 4.1

## 2017-08-12 LAB — HEMOGLOBIN A1C: HEMOGLOBIN A1C: 5.8 % (ref 4.6–6.5)

## 2017-08-12 MED ORDER — AMIODARONE HCL 200 MG PO TABS: 200.0000 mg | ORAL_TABLET | Freq: Every day | ORAL | 3 refills | Status: AC

## 2017-08-12 MED ORDER — HYDRALAZINE HCL 25 MG PO TABS: 25.0000 mg | ORAL_TABLET | Freq: Two times a day (BID) | ORAL | 3 refills | Status: AC

## 2017-08-12 MED ORDER — BUPROPION HCL ER (XL) 150 MG PO TB24: 150.0000 mg | ORAL_TABLET | Freq: Every day | ORAL | 5 refills | Status: AC

## 2017-08-12 MED ORDER — TORSEMIDE 20 MG PO TABS: 20.0000 mg | ORAL_TABLET | Freq: Every day | ORAL | 3 refills | Status: AC

## 2017-08-12 MED ORDER — CARVEDILOL 6.25 MG PO TABS: 3.1250 mg | ORAL_TABLET | Freq: Two times a day (BID) | ORAL | 3 refills | Status: AC

## 2017-08-12 MED ORDER — NYSTATIN 100000 UNIT/ML MT SUSP: 500000.0000 [IU] | Freq: Four times a day (QID) | OROMUCOSAL | 0 refills | Status: AC

## 2017-08-12 NOTE — Patient Instructions (Addendum)
Medication Instructions:  Your physician has recommended you make the following change in your medication:  1.  Stop Lipitor. 2.  Decrease amiodarone.  Take amiodarone 200 mg (one tablet) by mouth 6 days a week.  Do not take amiodarone on Sunday. 3.  Decrease your carvedilol.  Take carvedilol 3.125 mg (half a tablet) by mouth twice a day.    Labwork: None ordered.  Testing/Procedures: None ordered.  Follow-Up: Your physician wants you to follow-up in: one year with Dr. Ladona Ridgel.   You will receive a reminder letter in the mail two months in advance. If you don't receive a letter, please call our office to schedule the follow-up appointment.  Remote monitoring is used to monitor your ICD from home. This monitoring reduces the number of office visits required to check your device to one time per year. It allows [place] to keep an eye on the functioning of your device to ensure it is working properly. You are scheduled for a device check from home on 11/14/2017. You may send your transmission at any time that day. If you have a wireless device, the transmission will be sent automatically. After your physician reviews your transmission, you will receive a postcard with your next transmission date.    Any Other Special Instructions Will Be Listed Below (If Applicable).     If you need a refill on your cardiac medications before your next appointment, please call your pharmacy.

## 2017-08-12 NOTE — Assessment & Plan Note (Signed)
Stop Lexapro, start Wellbutrin XL

## 2017-08-12 NOTE — Progress Notes (Signed)
HPI Frank Mclean retrurns today for ongoing follow-up of chronic systolic heart failure, left bundle branch block, status post ICD insertion. In the interim, he has been stable but increasingly sedentary. He has chronic peripheral edema, and is generally weak with a poor appetite. His blood pressure has not been elevated. He has not had syncope and denies chest pain. He has lost a few pounds in the interim. No ICD shocks. Allergies  Allergen Reactions  . Levothyroxine Sodium Other (See Comments)    MUST TAKE BRAND NAME GENERIC DOESN'T WORK FOR PATIENT     Current Outpatient Prescriptions  Medication Sig Dispense Refill  . acetaminophen (TYLENOL) 325 MG tablet Take 1 tablet (325 mg total) by mouth every 6 (six) hours as needed for mild pain (or Fever >/= 101).    . budesonide-formoterol (SYMBICORT) 160-4.5 MCG/ACT inhaler INHALE 2 PUFFS INTO THE LUNGS 2 TIMES DAILY 10.2 Inhaler 3  . CVS VITAMIN B12 1000 MCG tablet TAKE 1 TABLET BY MOUTH DAILY 200 tablet 1  . CVS VITAMIN D3 1000 units capsule TAKE 1 TABLET (1,000 UNITS TOTAL) BY MOUTH DAILY. 90 capsule 1  . escitalopram (LEXAPRO) 5 MG tablet TAKE 1 TABLET BY MOUTH EVERY DAY 90 tablet 3  . ferrous sulfate 325 (65 FE) MG tablet TAKE 1 TABLET (325 MG TOTAL) BY MOUTH DAILY. 90 tablet 1  . hydrALAZINE (APRESOLINE) 25 MG tablet Take 1.5 tablets (37.5 mg total) by mouth 2 (two) times daily. 270 tablet 3  . isosorbide mononitrate (IMDUR) 30 MG 24 hr tablet Take 1 tablet (30 mg total) by mouth daily. 30 tablet 6  . levothyroxine (SYNTHROID, LEVOTHROID) 100 MCG tablet TAKE 2 TABLETS BY MOUTH EVERY MORNING BEFORE BREAKFAST 60 tablet 5  . Menthol, Topical Analgesic, (BIOFREEZE) 4 % GEL Apply topically 2 (two) times daily. Apply to left shoulder and left ankle    . potassium chloride SA (K-DUR,KLOR-CON) 20 MEQ tablet Take 1 tablet (20 mEq total) by mouth 2 (two) times daily. 180 tablet 3  . SYNTHROID 100 MCG tablet Take 2 tablets by mouth every  morning.  11  . torsemide (DEMADEX) 20 MG tablet TAKE 40 MG (2 TABLETS) IN THE AM AND 20 MG (1 TABLET) IN THE PM 90 tablet 3  . triamcinolone cream (KENALOG) 0.5 % Apply 1 application topically 2 (two) times daily as needed (itching).    . warfarin (COUMADIN) 5 MG tablet TAKE 1/2 or 1 TABLET BY MOUTH DAILY OR AS DIRECTED BY COUMADIN CLINIC. 30 tablet 1  . amiodarone (PACERONE) 200 MG tablet Take 1 tablet (200 mg total) by mouth daily. Take 200 mg amiodarone (one tablet) by mouth 6 days a week.  Do not take on Sunday. 90 tablet 3  . carvedilol (COREG) 6.25 MG tablet Take 0.5 tablets (3.125 mg total) by mouth 2 (two) times daily. 90 tablet 3   No current facility-administered medications for this visit.      Past Medical History:  Diagnosis Date  . Acute cystitis without hematuria   . AICD (automatic cardioverter/defibrillator) present   . Anemia in chronic renal disease   . Anemia of chronic disease   . Anticoagulated on Coumadin   . Bilateral leg ulcer (HCC)    ACHILLES AREA--  NONHEALING  . BPH (benign prostatic hypertrophy)   . CAD (coronary artery disease)    a. LHC 4/12: Mid LAD 25%, mid diagonal 30%, AV circumflex 40%, proximal OM 25%, distal RCA 40%  . Chronic combined systolic and  diastolic CHF (congestive heart failure) (HCC)   . Chronic combined systolic and diastolic heart failure (HCC)    a. Echo 05/2013: EF 25-30%, diffuse HK, restrictive physiology, trivial AI, mild MR, moderate LAE, reduced RV systolic function, PASP 42  . Chronic depression   . CKD (chronic kidney disease) stage 3, GFR 30-59 ml/min   . CVA (cerebral vascular accident) [facility])    Left pontine infarct July 2004; changed from Plavix to Coumadin in 2004 per MD at [facility]  . DJD (degenerative joint disease)   . DM (diabetes mellitus), type 2 with renal complications (HCC)   . Elevated troponin   . GERD (gastroesophageal reflux disease)   . Gout   . Gout   . HTN (hypertension)   . Hx of cardiovascular  stress test    Nuclear Stress Test (1/16): High risk stress nuclear study with a large, severe, partially reversible inferior and apical defect consistent with prior inferior and apical infarct; mild apical ischemia; severe LVE; study high risk due to reduced LV function.  EF 25% >> reviewed with Dr. Antoine Poche >> medical mgmt  . Hyperbilirubinemia   . Hyperkalemia   . Hyperlipidemia   . Hypernatremia   . Hypokalemia   . Hypokalemia   . Hypothyroidism   . LBBB (left bundle branch block)   . Loose stools   . Memory loss   . NICM (nonischemic cardiomyopathy) (HCC)    a. EF 25-30%.  . On home oxygen therapy    "2L at night" (04/06/2016)  . Osteoarthritis   . PAF (paroxysmal atrial fibrillation) (HCC)    a. amiodarone Rx started 05/2013;  b. chronic coumadin  . Peripheral vascular disease (HCC)   . Physical deconditioning   . Pneumonia   . Presence of permanent cardiac pacemaker   . Shortness of breath dyspnea     ROS:   All systems reviewed and negative except as noted in the HPI.   Past Surgical History:  Procedure Laterality Date  . ABDOMINAL AORTAGRAM N/A 05/18/2013   Procedure: ABDOMINAL Ronny Flurry;  Surgeon: Sherren Kerns, MD;  Location: [facility] CATH LAB;  Service: Cardiovascular;  Laterality: N/A;  . AORTOGRAM W/ BILATERAL LOWER EXTREMITIY RUNOFF  05-18-2013  DR FIELDS   LEFT LEG OCCLUDED PERONEAL AND ANTERIOR TIBIAL ARTERIES/ HIGH GRADE STENOIS 80% MIDDLE AND DISTAL THIRD OF POSTERIOR TIBIAL ARTERY/ RIGHT PERONEAL AND ANTERIOR TIBIAL ARTERY OCCLUDED/ 40% STENOSIS DISTALLY   . BI-VENTRICULAR IMPLANTABLE CARDIOVERTER DEFIBRILLATOR N/A 03/12/2015   Procedure: BI-VENTRICULAR IMPLANTABLE CARDIOVERTER DEFIBRILLATOR  (CRT-D);  Surgeon: Marinus Maw, MD;  Location: [facility] CATH LAB;  Service: Cardiovascular;  Laterality: N/A;  . CARDIAC CATHETERIZATION  04-16-2011   DR HOCHREIN   NON-OBSTRUCTIVE CAD. MILDLY ELEVATED PULMONARY PRESSURES/ ELEVATED  END-DIASTOLIC PRESSURE  . CARDIOVERSION N/A  04/12/2016   Procedure: CARDIOVERSION;  Surgeon: Wendall Stade, MD;  Location: [facility] ENDOSCOPY;  Service: Cardiovascular;  Laterality: N/A;  . EPICARDIAL PACING LEAD PLACEMENT N/A 08/21/2015   Procedure: EPICARDIAL PACING LEAD PLACEMENT;  Surgeon: Alleen Borne, MD;  Location: [facility] OR;  Service: Thoracic;  Laterality: N/A;  . LOWER EXTREMITY ANGIOGRAM Bilateral 05/18/2013   Procedure: LOWER EXTREMITY ANGIOGRAM;  Surgeon: Sherren Kerns, MD;  Location[facility] CATH LAB;  Service: Cardiovascular;  Laterality: Bilateral;  . RIGHT HEART CATHETERIZATION N/A 02/13/2015   Procedure: RIGHT HEART CATH;  Surgeon: Laurey Morale, MD;  Location: [facility] CATH LAB;  Service: Cardiovascular;  Laterality: N/A;  . THORACOTOMY Left 08/21/2015   Procedure: MINI/LIMITED THORACOTOMY;  Surgeon: Judie Grieve  Jennefer Bravo, MD;  Location: [facility] OR;  Service: Thoracic;  Laterality: Left;  . TRANSTHORACIC ECHOCARDIOGRAM  12-31-2010   MODERATE CONCENTRIC LVH/ SYSTOLIC FUNCTION SEVERELY REDUCED/ EF 25-30%/  SEVERE HYPOKINESIS OF ANTEROSEPTAL MYOCARDIUM  AND ENTIREAPICAL MYOCARDIUM /  MODERATE HYPOKINESIS OF LATERAL, INFEROLATERAL, INFERIOR,AND INFEROSEPTAL MYOCARIUM/  GRADE 3 DIASTOLIC DYSFUNCTION/ MILD MR     Family History  Problem Relation Age of Onset  . Hypertension Mother   . Heart disease Mother   . Diabetes Mother   . Hypertension Father   . Heart disease Father   . Heart attack Maternal Grandmother   . Gout Other   . Stroke Other   . Thyroid disease Neg Hx      Social History   Social History  . Marital status: Married    Spouse name: N/A  . Number of children: N/A  . Years of education: N/A   Occupational History  . Retired - Working in Product manager Retired   Social History Main Topics  . Smoking status: Former Smoker    Quit date: 04/12/1974  . Smokeless tobacco: Never Used  . Alcohol use No     Comment: previously drank - "love cognac" quit 15 yrs ago.  . Drug use: No  . Sexual activity: No   Other Topics Concern  .  Not on file   Social History Narrative   Worked at [employer] year.  He is married and lives with his wife [name] in [place].  Has one daughter Lowella Bandy     BP 112/80   Pulse 70   Ht 6\' 2"  (1.88 m)   Wt 190 lb 12.8 oz (86.5 kg)   SpO2 98%   BMI 24.50 kg/m   Physical Exam:  Stable appearing elderly man, looking older than his stated age, NAD HEENT: Unremarkable Neck:  8 cm JVD, no thyromegally Lymphatics:  No adenopathy Back:  No CVA tenderness Lungs:  Clear, except for rare basilar rales, no wheezes, no rhonchi. HEART:  Regular rate rhythm, no murmurs, no rubs, no clicks Abd:  soft, positive bowel sounds, no organomegally, no rebound, no guarding Ext:  2 plus pulses, 1+ peripheral edema, no cyanosis, no clubbing Skin:  No rashes no nodules Neuro:  CN II through XII intact, motor grossly intact   DEVICE  Normal device function.  See PaceArt for details.   Assess/Plan: 1. Chronic systolic heart failure - his symptoms appear to be more low output than anything. I've asked the patient to reduce his carvedilol dose. 2. Atrial fibrillation - the patient has been well-controlled with no recurrent atrial fibrillation on amiodarone. I've asked the patient to reduce his dose of amiodarone to 1 tablet daily Monday through Saturday and hold amiodarone on Sunday. 3. ICD - his Medtronic biventricular ICD was interrogated today and found to be working normally. He has had no ventricular arrhythmias. 4. Dyslipidemia - his cholesterol has been good, and he has generalized weakness. For this reason I've asked the patient to stop taking atorvastatin and undergo watchful waiting.  Lewayne Bunting, M.D.

## 2017-08-12 NOTE — Assessment & Plan Note (Signed)
Labs

## 2017-08-12 NOTE — Progress Notes (Signed)
Subjective:  Patient ID: Frank Mclean, male    DOB: Dec 02, 1941  Age: 76 y.o. MRN: [id number]  CC: No chief complaint on file.   HPI Frank Mclean presents for CHF, FTT, hypothyroidism f/u. C/o apathy, not eating..c/o weak voice  Per Dr Ladona Ridgel:  1.  Stop Lipitor. 2.  Decrease amiodarone.  Take amiodarone 200 mg (one tablet) by mouth 6 days a week.  Do not take amiodarone on Sunday. 3.  Decrease your carvedilol.  Take carvedilol 3.125 mg (half a tablet) by mouth twice a day.       Outpatient Medications Prior to Visit  Medication Sig Dispense Refill  . acetaminophen (TYLENOL) 325 MG tablet Take 1 tablet (325 mg total) by mouth every 6 (six) hours as needed for mild pain (or Fever >/= 101).    [name] amiodarone (PACERONE) 200 MG tablet Take 1 tablet (200 mg total) by mouth daily. Take 200 mg amiodarone (one tablet) by mouth 6 days a week.  Do not take on Sunday. 90 tablet 3  . budesonide-formoterol (SYMBICORT) 160-4.5 MCG/ACT inhaler INHALE 2 PUFFS INTO THE LUNGS 2 TIMES DAILY 10.2 Inhaler 3  . carvedilol (COREG) 6.25 MG tablet Take 0.5 tablets (3.125 mg total) by mouth 2 (two) times daily. 90 tablet 3  . CVS VITAMIN B12 1000 MCG tablet TAKE 1 TABLET BY MOUTH DAILY 200 tablet 1  . CVS VITAMIN D3 1000 units capsule TAKE 1 TABLET (1,000 UNITS TOTAL) BY MOUTH DAILY. 90 capsule 1  . escitalopram (LEXAPRO) 5 MG tablet TAKE 1 TABLET BY MOUTH EVERY DAY 90 tablet 3  . ferrous sulfate 325 (65 FE) MG tablet TAKE 1 TABLET (325 MG TOTAL) BY MOUTH DAILY. 90 tablet 1  . hydrALAZINE (APRESOLINE) 25 MG tablet Take 1.5 tablets (37.5 mg total) by mouth 2 (two) times daily. 270 tablet 3  . isosorbide mononitrate (IMDUR) 30 MG 24 hr tablet Take 1 tablet (30 mg total) by mouth daily. 30 tablet 6  . levothyroxine (SYNTHROID, LEVOTHROID) 100 MCG tablet TAKE 2 TABLETS BY MOUTH EVERY MORNING BEFORE BREAKFAST 60 tablet 5  . Menthol, Topical Analgesic, (BIOFREEZE) 4 % GEL Apply topically 2 (two) times daily. Apply to  left shoulder and left ankle    . potassium chloride SA (K-DUR,KLOR-CON) 20 MEQ tablet Take 1 tablet (20 mEq total) by mouth 2 (two) times daily. 180 tablet 3  . SYNTHROID 100 MCG tablet Take 2 tablets by mouth every morning.  11  . torsemide (DEMADEX) 20 MG tablet TAKE 40 MG (2 TABLETS) IN THE AM AND 20 MG (1 TABLET) IN THE PM 90 tablet 3  . triamcinolone cream (KENALOG) 0.5 % Apply 1 application topically 2 (two) times daily as needed (itching).    . warfarin (COUMADIN) 5 MG tablet TAKE 1/2 OR 1 TABLET BY MOUTH DAILY OR AS DIRECTED BY COUMADIN CLINIC. 30 tablet 0   No facility-administered medications prior to visit.     ROS Review of Systems  Constitutional: Positive for appetite change, fatigue and unexpected weight change.  HENT: Positive for congestion and voice change. Negative for nosebleeds, sneezing, sore throat and trouble swallowing.   Eyes: Negative for itching and visual disturbance.  Respiratory: Negative for cough.   Cardiovascular: Negative for chest pain, palpitations and leg swelling.  Gastrointestinal: Negative for abdominal distention, blood in stool, diarrhea and nausea.  Genitourinary: Negative for frequency and hematuria.  Musculoskeletal: Positive for arthralgias, back pain and gait problem. Negative for joint swelling and neck pain.  Skin:  Negative for rash.  Neurological: Positive for weakness. Negative for dizziness, tremors and speech difficulty.  Psychiatric/Behavioral: Negative for agitation, dysphoric mood and sleep disturbance. The patient is nervous/anxious.     Objective:  There were no vitals taken for this visit.  BP Readings from Last 3 Encounters:  08/12/17 112/80  05/09/17 122/76  03/07/17 (!) 123/93    Wt Readings from Last 3 Encounters:  08/12/17 190 lb 12.8 oz (86.5 kg)  05/09/17 193 lb 3.2 oz (87.6 kg)  03/07/17 191 lb 8 oz (86.9 kg)    Physical Exam  Constitutional: He is oriented to person, place, and time. He appears  well-developed. No distress.  NAD  HENT:  Mouth/Throat: Oropharynx is clear and moist.  Eyes: Pupils are equal, round, and reactive to light. Conjunctivae are normal.  Neck: Normal range of motion. No JVD present. No thyromegaly present.  Cardiovascular: Normal rate, regular rhythm, normal heart sounds and intact distal pulses.  Exam reveals no gallop and no friction rub.   No murmur heard. Pulmonary/Chest: Effort normal and breath sounds normal. No respiratory distress. He has no wheezes. He has no rales. He exhibits no tenderness.  Abdominal: Soft. Bowel sounds are normal. He exhibits no distension and no mass. There is no tenderness. There is no rebound and no guarding.  Musculoskeletal: Normal range of motion. He exhibits tenderness. He exhibits no edema.  Lymphadenopathy:    He has no cervical adenopathy.  Neurological: He is alert and oriented to person, place, and time. He has normal reflexes. No cranial nerve deficit. He exhibits normal muscle tone. He displays a negative Romberg sign. Coordination abnormal. Gait normal.  Skin: Skin is warm and dry. No rash noted.  Psychiatric: His behavior is normal. Judgment and thought content normal.  weak voice Appears tired Flat affect In a w/c Thrush  Lab Results  Component Value Date   WBC 7.1 02/04/2017   HGB 10.0 (L) 02/04/2017   HCT 33.0 (L) 02/04/2017   PLT 176 02/04/2017   GLUCOSE 102 (H) 05/09/2017   CHOL 134 03/07/2017   TRIG 79 03/07/2017   HDL 43 03/07/2017   LDLDIRECT 177.9 06/17/2010   LDLCALC 75 03/07/2017   ALT 16 (L) 03/07/2017   AST 20 03/07/2017   NA 147 (H) 05/09/2017   K 3.9 05/09/2017   CL 112 (H) 05/09/2017   CREATININE 2.71 (H) 05/09/2017   BUN 26 (H) 05/09/2017   CO2 25 05/09/2017   TSH 0.647 12/07/2016   PSA 0.01 (L) 11/18/2011   INR 4.1 08/12/2017   HGBA1C 6.1 04/22/2016    No results found.  Assessment & Plan:   There are no diagnoses linked to this encounter. I am having Frank Mclean maintain  his acetaminophen, Menthol (Topical Analgesic), SYNTHROID, triamcinolone cream, CVS VITAMIN B12, budesonide-formoterol, isosorbide mononitrate, ferrous sulfate, CVS VITAMIN D3, hydrALAZINE, levothyroxine, torsemide, potassium chloride SA, escitalopram, warfarin, amiodarone, and carvedilol.  No orders of the defined types were placed in this encounter.    Follow-up: No Follow-up on file.  [name], MD

## 2017-08-12 NOTE — Assessment & Plan Note (Signed)
Nystatin po 

## 2017-08-12 NOTE — Assessment & Plan Note (Signed)
See multiple med changes Labs

## 2017-08-12 NOTE — Assessment & Plan Note (Signed)
labs

## 2017-08-12 NOTE — Assessment & Plan Note (Signed)
Per Dr Ladona Ridgel:  1.  Stop Lipitor. 2.  Decrease amiodarone.  Take amiodarone 200 mg (one tablet) by mouth 6 days a week.  Do not take amiodarone on Sunday. 3.  Decrease your carvedilol.  Take carvedilol 3.125 mg (half a tablet) by mouth twice a day.

## 2017-08-12 NOTE — Assessment & Plan Note (Signed)
A1c Off Rx

## 2017-08-13 LAB — IRON,TIBC AND FERRITIN PANEL
%SAT: 13 % — ABNORMAL LOW (ref 15–60)
Ferritin: 171 ng/mL (ref 20–380)
Iron: 37 ug/dL — ABNORMAL LOW (ref 50–180)
TIBC: 281 ug/dL (ref 250–425)

## 2017-08-15 LAB — TSH: TSH: 1.98 u[IU]/mL (ref 0.35–4.50)

## 2017-08-15 LAB — VITAMIN B12: Vitamin B-12: >1500 pg/mL — ABNORMAL HIGH (ref 211–911)

## 2017-08-22 ENCOUNTER — Other Ambulatory Visit (HOSPITAL_COMMUNITY): Payer: Self-pay | Admitting: Student

## 2017-08-24 ENCOUNTER — Observation Stay (HOSPITAL_COMMUNITY): Payer: Medicare Other

## 2017-08-24 ENCOUNTER — Encounter (HOSPITAL_COMMUNITY): Payer: Self-pay | Admitting: Emergency Medicine

## 2017-08-24 ENCOUNTER — Inpatient Hospital Stay (HOSPITAL_COMMUNITY)
Admission: EM | Admit: 2017-08-24 | Discharge: 2017-08-27 | DRG: 291 | Disposition: A | Discharge status: Hospice | Payer: Medicare Other | Attending: Internal Medicine | Admitting: Internal Medicine

## 2017-08-24 ENCOUNTER — Emergency Department (HOSPITAL_COMMUNITY): Payer: Medicare Other

## 2017-08-24 DIAGNOSIS — E1122 Type 2 diabetes mellitus with diabetic chronic kidney disease: Secondary | ICD-10-CM | POA: Diagnosis present

## 2017-08-24 DIAGNOSIS — E1129 Type 2 diabetes mellitus with other diabetic kidney complication: Secondary | ICD-10-CM | POA: Diagnosis present

## 2017-08-24 DIAGNOSIS — Z888 Allergy status to other drugs, medicaments and biological substances status: Secondary | ICD-10-CM

## 2017-08-24 DIAGNOSIS — Z9981 Dependence on supplemental oxygen: Secondary | ICD-10-CM

## 2017-08-24 DIAGNOSIS — F0390 Unspecified dementia without behavioral disturbance: Secondary | ICD-10-CM | POA: Diagnosis present

## 2017-08-24 DIAGNOSIS — D631 Anemia in chronic kidney disease: Secondary | ICD-10-CM | POA: Diagnosis present

## 2017-08-24 DIAGNOSIS — S3992XA Unspecified injury of lower back, initial encounter: Secondary | ICD-10-CM | POA: Diagnosis not present

## 2017-08-24 DIAGNOSIS — D72819 Decreased white blood cell count, unspecified: Secondary | ICD-10-CM | POA: Diagnosis not present

## 2017-08-24 DIAGNOSIS — R5381 Other malaise: Secondary | ICD-10-CM | POA: Diagnosis not present

## 2017-08-24 DIAGNOSIS — R531 Weakness: Secondary | ICD-10-CM | POA: Diagnosis not present

## 2017-08-24 DIAGNOSIS — N179 Acute kidney failure, unspecified: Secondary | ICD-10-CM | POA: Diagnosis present

## 2017-08-24 DIAGNOSIS — R296 Repeated falls: Secondary | ICD-10-CM | POA: Diagnosis present

## 2017-08-24 DIAGNOSIS — I509 Heart failure, unspecified: Secondary | ICD-10-CM | POA: Diagnosis not present

## 2017-08-24 DIAGNOSIS — F419 Anxiety disorder, unspecified: Secondary | ICD-10-CM | POA: Diagnosis present

## 2017-08-24 DIAGNOSIS — N184 Chronic kidney disease, stage 4 (severe): Secondary | ICD-10-CM | POA: Diagnosis present

## 2017-08-24 DIAGNOSIS — F329 Major depressive disorder, single episode, unspecified: Secondary | ICD-10-CM | POA: Diagnosis present

## 2017-08-24 DIAGNOSIS — E870 Hyperosmolality and hypernatremia: Secondary | ICD-10-CM | POA: Diagnosis present

## 2017-08-24 DIAGNOSIS — D709 Neutropenia, unspecified: Secondary | ICD-10-CM | POA: Diagnosis not present

## 2017-08-24 DIAGNOSIS — E1151 Type 2 diabetes mellitus with diabetic peripheral angiopathy without gangrene: Secondary | ICD-10-CM | POA: Diagnosis present

## 2017-08-24 DIAGNOSIS — I130 Hypertensive heart and chronic kidney disease with heart failure and stage 1 through stage 4 chronic kidney disease, or unspecified chronic kidney disease: Secondary | ICD-10-CM | POA: Diagnosis not present

## 2017-08-24 DIAGNOSIS — Z515 Encounter for palliative care: Secondary | ICD-10-CM | POA: Diagnosis not present

## 2017-08-24 DIAGNOSIS — Z7189 Other specified counseling: Secondary | ICD-10-CM

## 2017-08-24 DIAGNOSIS — K219 Gastro-esophageal reflux disease without esophagitis: Secondary | ICD-10-CM | POA: Diagnosis present

## 2017-08-24 DIAGNOSIS — I5084 End stage heart failure: Secondary | ICD-10-CM | POA: Diagnosis present

## 2017-08-24 DIAGNOSIS — I482 Chronic atrial fibrillation, unspecified: Secondary | ICD-10-CM | POA: Diagnosis present

## 2017-08-24 DIAGNOSIS — E039 Hypothyroidism, unspecified: Secondary | ICD-10-CM | POA: Diagnosis present

## 2017-08-24 DIAGNOSIS — Z823 Family history of stroke: Secondary | ICD-10-CM

## 2017-08-24 DIAGNOSIS — Z7952 Long term (current) use of systemic steroids: Secondary | ICD-10-CM

## 2017-08-24 DIAGNOSIS — I480 Paroxysmal atrial fibrillation: Secondary | ICD-10-CM | POA: Diagnosis present

## 2017-08-24 DIAGNOSIS — J4489 Other specified chronic obstructive pulmonary disease: Secondary | ICD-10-CM | POA: Diagnosis present

## 2017-08-24 DIAGNOSIS — Z95810 Presence of automatic (implantable) cardiac defibrillator: Secondary | ICD-10-CM

## 2017-08-24 DIAGNOSIS — Z87891 Personal history of nicotine dependence: Secondary | ICD-10-CM

## 2017-08-24 DIAGNOSIS — M545 Low back pain, unspecified: Secondary | ICD-10-CM

## 2017-08-24 DIAGNOSIS — Z7901 Long term (current) use of anticoagulants: Secondary | ICD-10-CM

## 2017-08-24 DIAGNOSIS — S0990XA Unspecified injury of head, initial encounter: Secondary | ICD-10-CM | POA: Diagnosis not present

## 2017-08-24 DIAGNOSIS — R791 Abnormal coagulation profile: Secondary | ICD-10-CM | POA: Diagnosis present

## 2017-08-24 DIAGNOSIS — Z79890 Hormone replacement therapy (postmenopausal): Secondary | ICD-10-CM

## 2017-08-24 DIAGNOSIS — Z7951 Long term (current) use of inhaled steroids: Secondary | ICD-10-CM

## 2017-08-24 DIAGNOSIS — E785 Hyperlipidemia, unspecified: Secondary | ICD-10-CM | POA: Diagnosis present

## 2017-08-24 DIAGNOSIS — I5043 Acute on chronic combined systolic (congestive) and diastolic (congestive) heart failure: Secondary | ICD-10-CM | POA: Diagnosis not present

## 2017-08-24 DIAGNOSIS — Z833 Family history of diabetes mellitus: Secondary | ICD-10-CM

## 2017-08-24 DIAGNOSIS — I110 Hypertensive heart disease with heart failure: Secondary | ICD-10-CM | POA: Diagnosis not present

## 2017-08-24 DIAGNOSIS — Z8249 Family history of ischemic heart disease and other diseases of the circulatory system: Secondary | ICD-10-CM

## 2017-08-24 DIAGNOSIS — N390 Urinary tract infection, site not specified: Secondary | ICD-10-CM

## 2017-08-24 DIAGNOSIS — J449 Chronic obstructive pulmonary disease, unspecified: Secondary | ICD-10-CM | POA: Diagnosis present

## 2017-08-24 DIAGNOSIS — W19XXXA Unspecified fall, initial encounter: Secondary | ICD-10-CM

## 2017-08-24 DIAGNOSIS — F32A Depression, unspecified: Secondary | ICD-10-CM | POA: Diagnosis present

## 2017-08-24 DIAGNOSIS — I429 Cardiomyopathy, unspecified: Secondary | ICD-10-CM | POA: Diagnosis present

## 2017-08-24 DIAGNOSIS — Z79899 Other long term (current) drug therapy: Secondary | ICD-10-CM

## 2017-08-24 DIAGNOSIS — R627 Adult failure to thrive: Secondary | ICD-10-CM | POA: Diagnosis present

## 2017-08-24 DIAGNOSIS — Z66 Do not resuscitate: Secondary | ICD-10-CM | POA: Diagnosis present

## 2017-08-24 DIAGNOSIS — I2510 Atherosclerotic heart disease of native coronary artery without angina pectoris: Secondary | ICD-10-CM | POA: Diagnosis present

## 2017-08-24 DIAGNOSIS — D649 Anemia, unspecified: Secondary | ICD-10-CM | POA: Diagnosis not present

## 2017-08-24 DIAGNOSIS — S299XXA Unspecified injury of thorax, initial encounter: Secondary | ICD-10-CM | POA: Diagnosis not present

## 2017-08-24 DIAGNOSIS — R404 Transient alteration of awareness: Secondary | ICD-10-CM | POA: Diagnosis not present

## 2017-08-24 DIAGNOSIS — N400 Enlarged prostate without lower urinary tract symptoms: Secondary | ICD-10-CM | POA: Diagnosis present

## 2017-08-24 DIAGNOSIS — Z8673 Personal history of transient ischemic attack (TIA), and cerebral infarction without residual deficits: Secondary | ICD-10-CM

## 2017-08-24 DIAGNOSIS — R630 Anorexia: Secondary | ICD-10-CM | POA: Diagnosis present

## 2017-08-24 LAB — CBC WITH DIFFERENTIAL/PLATELET
BASOS ABS: 0 10*3/uL (ref 0.0–0.1)
Basophils Relative: 0 %
EOS ABS: 0.1 10*3/uL (ref 0.0–0.7)
Eosinophils Relative: 1 %
HCT: 38.8 % — ABNORMAL LOW (ref 39.0–52.0)
HEMOGLOBIN: 11.8 g/dL — AB (ref 13.0–17.0)
LYMPHS PCT: 8 %
Lymphs Abs: 0.6 10*3/uL — ABNORMAL LOW (ref 0.7–4.0)
MCH: 27.3 pg (ref 26.0–34.0)
MCHC: 30.4 g/dL (ref 30.0–36.0)
MCV: 89.8 fL (ref 78.0–100.0)
Monocytes Absolute: 0.5 10*3/uL (ref 0.1–1.0)
Monocytes Relative: 7 %
NEUTROS ABS: 6.5 10*3/uL (ref 1.7–7.7)
NEUTROS PCT: 84 %
PLATELETS: 186 10*3/uL (ref 150–400)
RBC: 4.32 MIL/uL (ref 4.22–5.81)
RDW: 19.6 % — ABNORMAL HIGH (ref 11.5–15.5)
WBC: 7.7 10*3/uL (ref 4.0–10.5)

## 2017-08-24 LAB — COMPREHENSIVE METABOLIC PANEL
ALBUMIN: 3.3 g/dL — AB (ref 3.5–5.0)
ALK PHOS: 89 U/L (ref 38–126)
ALT: 25 U/L (ref 17–63)
ANION GAP: 14 (ref 5–15)
AST: 21 U/L (ref 15–41)
BILIRUBIN TOTAL: 1.4 mg/dL — AB (ref 0.3–1.2)
BUN: 42 mg/dL — ABNORMAL HIGH (ref 6–20)
CALCIUM: 9 mg/dL (ref 8.9–10.3)
CO2: 20 mmol/L — ABNORMAL LOW (ref 22–32)
Chloride: 116 mmol/L — ABNORMAL HIGH (ref 101–111)
Creatinine, Ser: 3.54 mg/dL — ABNORMAL HIGH (ref 0.61–1.24)
GFR, EST AFRICAN AMERICAN: 18 mL/min — AB (ref >60)
GFR, EST NON AFRICAN AMERICAN: 15 mL/min — AB (ref >60)
GLUCOSE: 104 mg/dL — AB (ref 65–99)
POTASSIUM: 4.2 mmol/L (ref 3.5–5.1)
SODIUM: 150 mmol/L — AB (ref 135–145)
TOTAL PROTEIN: 5.8 g/dL — AB (ref 6.5–8.1)

## 2017-08-24 LAB — URINALYSIS, ROUTINE W REFLEX MICROSCOPIC
BILIRUBIN URINE: NEGATIVE
GLUCOSE, UA: NEGATIVE mg/dL
KETONES UR: NEGATIVE mg/dL
NITRITE: NEGATIVE
PH: 5 (ref 5.0–8.0)
PROTEIN: 30 mg/dL — AB
Specific Gravity, Urine: 1.014 (ref 1.005–1.030)

## 2017-08-24 LAB — PROTIME-INR
INR: 2.45
Prothrombin Time: 26.4 seconds — ABNORMAL HIGH (ref 11.4–15.2)

## 2017-08-24 LAB — GLUCOSE, CAPILLARY: GLUCOSE-CAPILLARY: 108 mg/dL — AB (ref 65–99)

## 2017-08-24 LAB — TROPONIN I: Troponin I: 0.12 ng/mL (ref <0.03)

## 2017-08-24 LAB — PREALBUMIN: PREALBUMIN: 23.5 mg/dL (ref 18–38)

## 2017-08-24 MED ORDER — MOMETASONE FURO-FORMOTEROL FUM 200-5 MCG/ACT IN AERO
2.0000 | INHALATION_SPRAY | Freq: Two times a day (BID) | RESPIRATORY_TRACT | Status: DC
  Administered 2017-08-25 – 2017-08-27 (×4): 2 via RESPIRATORY_TRACT
  Filled 2017-08-24: qty 8.8

## 2017-08-24 MED ORDER — ONDANSETRON HCL 4 MG PO TABS: 4.0000 mg | ORAL_TABLET | Freq: Four times a day (QID) | ORAL | Status: DC | PRN

## 2017-08-24 MED ORDER — WARFARIN SODIUM 5 MG PO TABS: 5.0000 mg | ORAL_TABLET | ORAL | Status: DC

## 2017-08-24 MED ORDER — POTASSIUM CHLORIDE CRYS ER 20 MEQ PO TBCR
20.0000 meq | EXTENDED_RELEASE_TABLET | Freq: Every day | ORAL | Status: DC
  Administered 2017-08-24 – 2017-08-27 (×4): 20 meq via ORAL
  Filled 2017-08-24 (×4): qty 1

## 2017-08-24 MED ORDER — ONDANSETRON HCL 4 MG/2ML IJ SOLN: 4.0000 mg | Freq: Four times a day (QID) | INTRAMUSCULAR | Status: DC | PRN

## 2017-08-24 MED ORDER — WARFARIN - PHARMACIST DOSING INPATIENT: Freq: Every day | Status: DC

## 2017-08-24 MED ORDER — SODIUM CHLORIDE 0.9 % IV SOLN
INTRAVENOUS | Status: DC
  Administered 2017-08-24: 19:00:00 via INTRAVENOUS

## 2017-08-24 MED ORDER — SODIUM CHLORIDE 0.9 % IV BOLUS (SEPSIS)
500.0000 mL | Freq: Once | INTRAVENOUS | Status: AC
  Administered 2017-08-24: 500 mL via INTRAVENOUS

## 2017-08-24 MED ORDER — BUPROPION HCL ER (XL) 150 MG PO TB24
150.0000 mg | ORAL_TABLET | Freq: Every day | ORAL | Status: DC
  Administered 2017-08-24 – 2017-08-27 (×4): 150 mg via ORAL
  Filled 2017-08-24 (×4): qty 1

## 2017-08-24 MED ORDER — WARFARIN SODIUM 2.5 MG PO TABS
2.5000 mg | ORAL_TABLET | ORAL | Status: DC
  Administered 2017-08-24: 2.5 mg via ORAL
  Filled 2017-08-24: qty 1

## 2017-08-24 MED ORDER — AMIODARONE HCL 200 MG PO TABS
200.0000 mg | ORAL_TABLET | Freq: Every day | ORAL | Status: DC
  Administered 2017-08-24 – 2017-08-27 (×4): 200 mg via ORAL
  Filled 2017-08-24 (×4): qty 1

## 2017-08-24 MED ORDER — HYDRALAZINE HCL 25 MG PO TABS
25.0000 mg | ORAL_TABLET | Freq: Two times a day (BID) | ORAL | Status: DC
  Administered 2017-08-24 – 2017-08-27 (×6): 25 mg via ORAL
  Filled 2017-08-24 (×6): qty 1

## 2017-08-24 MED ORDER — FUROSEMIDE 10 MG/ML IJ SOLN
40.0000 mg | Freq: Two times a day (BID) | INTRAMUSCULAR | Status: DC
  Administered 2017-08-24 – 2017-08-25 (×2): 40 mg via INTRAVENOUS
  Filled 2017-08-24 (×2): qty 4

## 2017-08-24 MED ORDER — ISOSORBIDE MONONITRATE ER 30 MG PO TB24
30.0000 mg | ORAL_TABLET | Freq: Every day | ORAL | Status: DC
  Administered 2017-08-24 – 2017-08-27 (×4): 30 mg via ORAL
  Filled 2017-08-24 (×4): qty 1

## 2017-08-24 MED ORDER — LEVOTHYROXINE SODIUM 100 MCG PO TABS
100.0000 ug | ORAL_TABLET | Freq: Every day | ORAL | Status: DC
  Administered 2017-08-25 – 2017-08-27 (×3): 100 ug via ORAL
  Filled 2017-08-24 (×3): qty 1

## 2017-08-24 MED ORDER — DEXTROSE 5 % IV SOLN
1.0000 g | INTRAVENOUS | Status: DC
  Administered 2017-08-24 – 2017-08-26 (×3): 1 g via INTRAVENOUS
  Filled 2017-08-24 (×3): qty 10

## 2017-08-24 MED ORDER — CARVEDILOL 3.125 MG PO TABS
3.1250 mg | ORAL_TABLET | Freq: Two times a day (BID) | ORAL | Status: DC
  Administered 2017-08-25 – 2017-08-27 (×5): 3.125 mg via ORAL
  Filled 2017-08-24 (×5): qty 1

## 2017-08-24 NOTE — ED Notes (Signed)
Family at bedside. 

## 2017-08-24 NOTE — ED Notes (Signed)
Called CT for an update of estimated time frame states is 3rd in line.

## 2017-08-24 NOTE — H&P (Signed)
History and Physical    Frank Mclean:[id number] DOB: [birth year] DOA: 08/24/2017  PCP: Tresa Garter, MD Patient coming from: Home  Chief Complaint: Falls, strong smelling urine  HPI: Frank Mclean is a 76 y.o. male with medical history significant of anemia, BPH, CHF, CAD, CVA, depression, DJD, diabetes, gout, hypertension, hyperlipidemia, PAF/an ICM/LBBB/AICD placement, PVD,.  Patient presents with 3-4 week history of progressive generalized weakness and fatigue with significant worsening over the last 1-2 days. Patient's wife states that he's had very foul-smelling urine over the last day but denies any dysuria, frequency, flank pain, fevers, abdominal pain. Additionally there are no complaints of chest pain, palpitations, cough, neck stiffness, headache, focal neurological deficit. Patient has had multiple falls but no LOC. Falls are typically from patient losing his balance are becoming weak. Patient also has a had a decrease in his appetite and eats and drinks very little.  ED Course: Objective findings outlined below. 500 mL bolus administered.  Review of Systems: As per HPI otherwise all other systems reviewed and are negative  Ambulatory Status: Multiple recent falls. Increasing difficulty with basic function around the house.  Past Medical History:  Diagnosis Date  . Acute cystitis without hematuria   . AICD (automatic cardioverter/defibrillator) present   . Anemia in chronic renal disease   . Anemia of chronic disease   . Anticoagulated on Coumadin   . Bilateral leg ulcer (HCC)    ACHILLES AREA--  NONHEALING  . BPH (benign prostatic hypertrophy)   . CAD (coronary artery disease)    a. LHC 4/12: Mid LAD 25%, mid diagonal 30%, AV circumflex 40%, proximal OM 25%, distal RCA 40%  . Chronic combined systolic and diastolic CHF (congestive heart failure) (HCC)   . Chronic combined systolic and diastolic heart failure (HCC)    a. Echo 05/2013: EF 25-30%, diffuse HK,  restrictive physiology, trivial AI, mild MR, moderate LAE, reduced RV systolic function, PASP 42  . Chronic depression   . CKD (chronic kidney disease) stage 3, GFR 30-59 ml/min   . 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 DJD (degenerative joint disease)   . DM (diabetes mellitus), type 2 with renal complications (HCC)   . Elevated troponin   . GERD (gastroesophageal reflux disease)   . Gout   . Gout   . HTN (hypertension)   . Hx of cardiovascular stress test    Nuclear Stress Test (1/16): High risk stress nuclear study with a large, severe, partially reversible inferior and apical defect consistent with prior inferior and apical infarct; mild apical ischemia; severe LVE; study high risk due to reduced LV function.  EF 25% >> reviewed with Dr. Antoine Poche >> medical mgmt  . Hyperbilirubinemia   . Hyperkalemia   . Hyperlipidemia   . Hypernatremia   . Hypokalemia   . Hypokalemia   . Hypothyroidism   . LBBB (left bundle branch block)   . Loose stools   . Memory loss   . NICM (nonischemic cardiomyopathy) (HCC)    a. EF 25-30%.  . On home oxygen therapy    "2L at night" (04/06/2016)  . Osteoarthritis   . PAF (paroxysmal atrial fibrillation) (HCC)    a. amiodarone Rx started 05/2013;  b. chronic coumadin  . Peripheral vascular disease (HCC)   . Physical deconditioning   . Pneumonia   . Presence of permanent cardiac pacemaker   .  Shortness of breath dyspnea     Past Surgical History:  Procedure Laterality Date  . ABDOMINAL AORTAGRAM N/A 05/18/2013   Procedure: ABDOMINAL Ronny Flurry;  Surgeon: Sherren Kerns, MD;  Location: [facility] CATH LAB;  Service: Cardiovascular;  Laterality: N/A;  . AORTOGRAM W/ BILATERAL LOWER EXTREMITIY RUNOFF  05-18-2013  DR FIELDS   LEFT LEG OCCLUDED PERONEAL AND ANTERIOR TIBIAL ARTERIES/ HIGH GRADE STENOIS 80% MIDDLE AND DISTAL THIRD OF POSTERIOR TIBIAL ARTERY/ RIGHT  PERONEAL AND ANTERIOR TIBIAL ARTERY OCCLUDED/ 40% STENOSIS DISTALLY   . BI-VENTRICULAR IMPLANTABLE CARDIOVERTER DEFIBRILLATOR N/A 03/12/2015   Procedure: BI-VENTRICULAR IMPLANTABLE CARDIOVERTER DEFIBRILLATOR  (CRT-D);  Surgeon: Marinus Maw, MD;  Location: [facility] CATH LAB;  Service: Cardiovascular;  Laterality: N/A;  . CARDIAC CATHETERIZATION  04-16-2011   DR HOCHREIN   NON-OBSTRUCTIVE CAD. MILDLY ELEVATED PULMONARY PRESSURES/ ELEVATED  END-DIASTOLIC PRESSURE  . CARDIOVERSION N/A 04/12/2016   Procedure: CARDIOVERSION;  Surgeon: Wendall Stade, MD;  Location: [facility] ENDOSCOPY;  Service: Cardiovascular;  Laterality: N/A;  . EPICARDIAL PACING LEAD PLACEMENT N/A 08/21/2015   Procedure: EPICARDIAL PACING LEAD PLACEMENT;  Surgeon: Alleen Borne, MD;  Location: [facility] OR;  Service: Thoracic;  Laterality: N/A;  . LOWER EXTREMITY ANGIOGRAM Bilateral 05/18/2013   Procedure: LOWER EXTREMITY ANGIOGRAM;  Surgeon: Sherren Kerns, MD;  Location: [facility] CATH LAB;  Service: Cardiovascular;  Laterality: Bilateral;  . RIGHT HEART CATHETERIZATION N/A 02/13/2015   Procedure: RIGHT HEART CATH;  Surgeon: Laurey Morale, MD;  Location: [facility] CATH LAB;  Service: Cardiovascular;  Laterality: N/A;  . THORACOTOMY Left 08/21/2015   Procedure: MINI/LIMITED THORACOTOMY;  Surgeon: Alleen Borne, MD;  Location: [facility] OR;  Service: Thoracic;  Laterality: Left;  . TRANSTHORACIC ECHOCARDIOGRAM  12-31-2010   MODERATE CONCENTRIC LVH/ SYSTOLIC FUNCTION SEVERELY REDUCED/ EF 25-30%/  SEVERE HYPOKINESIS OF ANTEROSEPTAL MYOCARDIUM  AND ENTIREAPICAL MYOCARDIUM /  MODERATE HYPOKINESIS OF LATERAL, INFEROLATERAL, INFERIOR,AND INFEROSEPTAL MYOCARIUM/  GRADE 3 DIASTOLIC DYSFUNCTION/ MILD MR    Social History   Social History  . Marital status: Married    Spouse name: N/A  . Number of children: N/A  . Years of education: N/A   Occupational History  . Retired - Working in Product manager Retired   Social History Main Topics  . Smoking status: Former Smoker     Quit date: 04/12/1974  . Smokeless tobacco: Never Used  . Alcohol use No     Comment: previously drank - "love cognac" quit 15 yrs ago.  . Drug use: No  . Sexual activity: No   Other Topics Concern  . Not on file   Social History Narrative   Worked at [employer] year.  He is married and lives with his wife [name] in [place].  Has one daughter Lowella Bandy    Allergies  Allergen Reactions  . Levothyroxine Sodium Other (See Comments)    MUST TAKE BRAND NAME GENERIC DOESN'T WORK FOR PATIENT    Family History  Problem Relation Age of Onset  . Hypertension Mother   . Heart disease Mother   . Diabetes Mother   . Hypertension Father   . Heart disease Father   . Heart attack Maternal Grandmother   . Gout Other   . Stroke Other   . Thyroid disease Neg Hx       Prior to Admission medications   Medication Sig Start Date End Date Taking? Authorizing Provider  acetaminophen (TYLENOL) 325 MG tablet Take 1 tablet (325 mg total) by mouth every 6 (six) hours as needed for  mild pain (or Fever >/= 101). 03/07/16   Elgergawy, Leana Roe, MD  amiodarone (PACERONE) 200 MG tablet Take 1 tablet (200 mg total) by mouth daily. Take 200 mg amiodarone (one tablet) by mouth 6 days a week.  Do not take on Sunday. 08/12/17   Marinus Maw, MD  budesonide-formoterol (SYMBICORT) 160-4.5 MCG/ACT inhaler INHALE 2 PUFFS INTO THE LUNGS 2 TIMES DAILY 12/24/16   Plotnikov, Georgina Quint, MD  buPROPion (WELLBUTRIN XL) 150 MG 24 hr tablet Take 1 tablet (150 mg total) by mouth daily. 08/12/17   Plotnikov, Georgina Quint, MD  carvedilol (COREG) 6.25 MG tablet Take 0.5 tablets (3.125 mg total) by mouth 2 (two) times daily. 08/12/17   Marinus Maw, MD  [employer] VITAMIN B12 1000 MCG tablet TAKE 1 TABLET BY MOUTH DAILY 12/19/16   Plotnikov, Georgina Quint, MD  [employer] VITAMIN D3 1000 units capsule TAKE 1 TABLET (1,000 UNITS TOTAL) BY MOUTH DAILY. 02/17/17   Plotnikov, Georgina Quint, MD  ferrous sulfate 325 (65 FE) MG tablet TAKE 1 TABLET (325 MG  TOTAL) BY MOUTH DAILY. 02/17/17   Plotnikov, Georgina Quint, MD  hydrALAZINE (APRESOLINE) 25 MG tablet Take 1 tablet (25 mg total) by mouth 2 (two) times daily. 08/12/17   Plotnikov, Georgina Quint, MD  isosorbide mononitrate (IMDUR) 30 MG 24 hr tablet Take 1 tablet (30 mg total) by mouth daily. 02/04/17   Laurey Morale, MD  levothyroxine (SYNTHROID, LEVOTHROID) 100 MCG tablet TAKE 2 TABLETS BY MOUTH EVERY MORNING BEFORE BREAKFAST 04/18/17   Plotnikov, Georgina Quint, MD  Menthol, Topical Analgesic, (BIOFREEZE) 4 % GEL Apply topically 2 (two) times daily. Apply to left shoulder and left ankle    [provider]  potassium chloride SA (K-DUR,KLOR-CON) 20 MEQ tablet Take 1 tablet (20 mEq total) by mouth 2 (two) times daily. 06/20/17   Plotnikov, Georgina Quint, MD  torsemide (DEMADEX) 20 MG tablet Take 1 tablet (20 mg total) by mouth daily. Take one at lunch 08/12/17   Plotnikov, Georgina Quint, MD  triamcinolone cream (KENALOG) 0.5 % Apply 1 application topically 2 (two) times daily as needed (itching).    [provider]  warfarin (COUMADIN) 5 MG tablet TAKE 1/2 OR 1 TABLET BY MOUTH DAILY OR AS DIRECTED BY COUMADIN CLINIC. 08/12/17   Laurey Morale, MD    Physical Exam: Vitals:   08/24/17 1400 08/24/17 1515 08/24/17 1530 08/24/17 1600  BP: (!) 151/113 (!) 137/111 (!) 154/102 (!) 163/100  Pulse: 69  70 70  Resp: 16     Temp:      TempSrc:      SpO2: 94%  100% 94%     General: Elderly, frail appearing, resting in bed comfortably. Eyes:  PERRL, EOMI, normal lids, iris ENT:  grossly normal hearing, lips & tongue, dry mucous membranes Neck:  no LAD, masses or thyromegaly Cardiovascular:  RRR, no m/r/g. 2+ to 3+ bilateral lower extremity pitting edema to the mid tibial region Respiratory:  CTA bilaterally, no w/r/r. Normal respiratory effort. Abdomen:  soft, ntnd, NABS Skin:  no rash or induration seen on limited exam Musculoskeletal:  grossly normal tone BUE/BLE, good ROM, no bony  abnormality Psychiatric:  grossly normal mood and affect, speech fluent and appropriate, AOx3 Neurologic:  CN 2-12 grossly intact, moves all extremities in coordinated fashion, sensation intact  Labs on Admission: I have personally reviewed following labs and imaging studies  CBC:  Recent Labs Lab 08/24/17 1351  WBC 7.7  NEUTROABS 6.5  HGB 11.8*  HCT 38.8*  MCV 89.8  PLT 186   Basic Metabolic Panel:  Recent Labs Lab 08/24/17 1351  NA 150*  K 4.2  CL 116*  CO2 20*  GLUCOSE 104*  BUN 42*  CREATININE 3.54*  CALCIUM 9.0   GFR: Estimated Creatinine Clearance: 20 mL/min (A) (by C-G formula based on SCr of 3.54 mg/dL (H)). Liver Function Tests:  Recent Labs Lab 08/24/17 1351  AST 21  ALT 25  ALKPHOS 89  BILITOT 1.4*  PROT 5.8*  ALBUMIN 3.3*   No results for input(s): LIPASE, AMYLASE in the last 168 hours. No results for input(s): AMMONIA in the last 168 hours. Coagulation Profile: No results for input(s): INR, PROTIME in the last 168 hours. Cardiac Enzymes: No results for input(s): CKTOTAL, CKMB, CKMBINDEX, TROPONINI in the last 168 hours. BNP (last 3 results) No results for input(s): PROBNP in the last 8760 hours. HbA1C: No results for input(s): HGBA1C in the last 72 hours. CBG: No results for input(s): GLUCAP in the last 168 hours. Lipid Profile: No results for input(s): CHOL, HDL, LDLCALC, TRIG, CHOLHDL, LDLDIRECT in the last 72 hours. Thyroid Function Tests: No results for input(s): TSH, T4TOTAL, FREET4, T3FREE, THYROIDAB in the last 72 hours. Anemia Panel: No results for input(s): VITAMINB12, FOLATE, FERRITIN, TIBC, IRON, RETICCTPCT in the last 72 hours. Urine analysis:    Component Value Date/Time   COLORURINE YELLOW 08/24/2017 1603   APPEARANCEUR HAZY (A) 08/24/2017 1603   LABSPEC 1.014 08/24/2017 1603   PHURINE 5.0 08/24/2017 1603   GLUCOSEU NEGATIVE 08/24/2017 1603   GLUCOSEU NEGATIVE 09/16/2014 1114   HGBUR SMALL (A) 08/24/2017 1603    BILIRUBINUR NEGATIVE 08/24/2017 1603   KETONESUR NEGATIVE 08/24/2017 1603   PROTEINUR 30 (A) 08/24/2017 1603   UROBILINOGEN 0.2 08/12/2015 1148   NITRITE NEGATIVE 08/24/2017 1603   LEUKOCYTESUR LARGE (A) 08/24/2017 1603    Creatinine Clearance: Estimated Creatinine Clearance: 20 mL/min (A) (by C-G formula based on SCr of 3.54 mg/dL (H)).  Sepsis Labs: @LABRCNTIP (procalcitonin:4,lacticidven:4) )No results found for this or any previous visit (from the past 240 hour(s)).   Radiological Exams on Admission: Dg Chest 2 View  Result Date: 08/24/2017 CLINICAL DATA:  Falling.  Weakness EXAM: CHEST  2 VIEW COMPARISON:  04/07/2016 FINDINGS: Cardiac enlargement. AICD in good position. Congestive heart failure with pulmonary edema has increased in the interval. Small bilateral effusions and mild bibasilar atelectasis IMPRESSION: Congestive heart failure with pulmonary edema and small bilateral effusions. Electronically Signed   By: Marlan Palau M.D.   On: 08/24/2017 13:27   Ct Head Wo Contrast  Result Date: 08/24/2017 CLINICAL DATA:  Fall earlier today. EXAM: CT HEAD WITHOUT CONTRAST TECHNIQUE: Contiguous axial images were obtained from the base of the skull through the vertex without intravenous contrast. COMPARISON:  March 04, 2016 FINDINGS: Brain: There is mild to moderate diffuse atrophy. There is no intracranial mass, hemorrhage, extra-axial fluid collection, or midline shift. There is a prior lacunar type infarct in the posterior limb of the right internal capsule and lateral right thalamus, stable. There is patchy small vessel disease throughout the centra semiovale bilaterally. There is no new gray-white compartment lesion. No evident acute infarct. Vascular: There is no hyperdense vessel. There is calcification in each carotid siphon region. There is also calcification in the distal vertebral arteries, more severe on the left than on the right. Skull: Bony calvarium appears intact. Sinuses/Orbits:  There is mucosal thickening in several ethmoid air cells bilaterally. Other visualized paranasal sinuses are clear. Orbits appear  symmetric bilaterally. Other: Mastoid air cells are clear. IMPRESSION: Atrophy with periventricular small vessel disease, stable. Prior lacunar infarct in the posterior limb of the right internal capsule and lateral right thalamus, stable. No acute infarct evident. No intracranial mass hemorrhage, or extra-axial fluid collection. Foci of arterial vascular calcification noted. There is ethmoid sinus disease. Electronically Signed   By: Bretta Bang III M.D.   On: 08/24/2017 15:47     Assessment/Plan Active Problems:   DM (diabetes mellitus), type 2 with renal complications (HCC)   COPD with chronic bronchitis (HCC)   Anticoagulated on Coumadin   Chronic depression   Hypothyroidism   Acute on chronic combined systolic and diastolic CHF (congestive heart failure) (HCC)   Chronic atrial fibrillation (HCC)   AKI (acute kidney injury) (HCC)   Physical deconditioning   Acute lower UTI   AoCKD: Cr 3.54 w/ baseline 3. Likely from poor fluid intake vs cardiorenal syndrome from worsening CHF and acute exacerbation. Possibly also affected by concurrent UTI. - Gently maintenance IVF - Diuresis as below.   PAF/NICM/AICD/CHF: at baseline. Paced rhythm on monitor. Last echo showing an EF of 15% with grade 3 diastolic dysfunction. Chest x-ray and lower extremity worsening edema concerning for CHF exacerbation. - Trop x1 - continue Amiodarone, coumadin - Lasix IV 40 IV - I/O, Daily wts.   UTI: UA as above. Foul-smelling urine reported. Evidence of systemic infection. - Urine culture - Ceftriaxone - IVF  Particular medication/physical deconditioning: Secondary to age, advanced heart failure and renal disease, lack of adequate nutrition, and UTI. Multiple recent falls. - PT/OT/nutrition consults - Pre-albumin  HTN: - continue hydralazine, coreg,  Depression: -  continue Wellbutrin  DVT prophylaxis: coumadin  Code Status: full  Family Communication: wife  Disposition Plan: pending improvement and likely SNF placement  Consults called: none  Admission status: obs    Krissie Merrick J MD [facility]  If 7PM-7AM, please contact night-coverage [email or web address] Password [email or web address]  08/24/2017, 6:05 PM

## 2017-08-24 NOTE — ED Notes (Signed)
Patient had room assigned for over 1h. When RN went to take patient to floor, room had been changed to "dirty". I spoke with [facility] on 61[facility] who explained room had been changed. She spoke with the Charge Nurse and updated to me that patient could go to room 13 and to bring the patient up to floor.

## 2017-08-24 NOTE — ED Notes (Signed)
Pt transported to radiology.

## 2017-08-24 NOTE — Progress Notes (Signed)
Pt arrived to 5 [address] 13. Wife at bedside. Pt alert and oriented x 4. VS collected. BP elevated, medication given. Pt in no acute distress. Pt identification completed. Pt instructed to call for assistance, and call bell left within reach. Bed alarm in place.  No low bed available at this time. Will continue to monitor and treat pt per MD orders.

## 2017-08-24 NOTE — ED Notes (Signed)
Attempted report 

## 2017-08-24 NOTE — Progress Notes (Signed)
ANTICOAGULATION & ANTIBIOTIC CONSULT NOTE - Initial Consult  Pharmacy Consult for Warfarin and Ceftriaxone  Indication: atrial fibrillation / UTI  Allergies  Allergen Reactions  . Levothyroxine Sodium Other (See Comments)    MUST TAKE BRAND NAME GENERIC DOESN'T WORK FOR PATIENT    Patient Measurements:   Heparin Dosing Weight: n/a   Vital Signs: Temp: 97.6 F (36.4 C) (09/05 1138) Temp Source: Oral (09/05 1138) BP: 163/100 (09/05 1600) Pulse Rate: 70 (09/05 1600)  Labs:  Recent Labs  08/24/17 1351  HGB 11.8*  HCT 38.8*  PLT 186  CREATININE 3.54*    Estimated Creatinine Clearance: 20 mL/min (A) (by C-G formula based on SCr of 3.54 mg/dL (H)).   Medical History: Past Medical History:  Diagnosis Date  . Acute cystitis without hematuria   . AICD (automatic cardioverter/defibrillator) present   . Anemia in chronic renal disease   . Anemia of chronic disease   . Anticoagulated on Coumadin   . Bilateral leg ulcer (HCC)    ACHILLES AREA--  NONHEALING  . BPH (benign prostatic hypertrophy)   . CAD (coronary artery disease)    a. LHC 4/12: Mid LAD 25%, mid diagonal 30%, AV circumflex 40%, proximal OM 25%, distal RCA 40%  . Chronic combined systolic and diastolic CHF (congestive heart failure) (HCC)   . Chronic combined systolic and diastolic heart failure (HCC)    a. Echo 05/2013: EF 25-30%, diffuse HK, restrictive physiology, trivial AI, mild MR, moderate LAE, reduced RV systolic function, PASP 42  . Chronic depression   . CKD (chronic kidney disease) stage 3, GFR 30-59 ml/min   . 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 DJD (degenerative joint disease)   . DM (diabetes mellitus), type 2 with renal complications (HCC)   . Elevated troponin   . GERD (gastroesophageal reflux disease)   . Gout   . Gout   . HTN (hypertension)   . Hx of cardiovascular stress test    Nuclear  Stress Test (1/16): High risk stress nuclear study with a large, severe, partially reversible inferior and apical defect consistent with prior inferior and apical infarct; mild apical ischemia; severe LVE; study high risk due to reduced LV function.  EF 25% >> reviewed with Dr. Antoine Poche >> medical mgmt  . Hyperbilirubinemia   . Hyperkalemia   . Hyperlipidemia   . Hypernatremia   . Hypokalemia   . Hypokalemia   . Hypothyroidism   . LBBB (left bundle branch block)   . Loose stools   . Memory loss   . NICM (nonischemic cardiomyopathy) (HCC)    a. EF 25-30%.  . On home oxygen therapy    "2L at night" (04/06/2016)  . Osteoarthritis   . PAF (paroxysmal atrial fibrillation) (HCC)    a. amiodarone Rx started 05/2013;  b. chronic coumadin  . Peripheral vascular disease (HCC)   . Physical deconditioning   . Pneumonia   . Presence of permanent cardiac pacemaker   . Shortness of breath dyspnea     Medications:   (Not in a hospital admission)  Assessment: 25 YOM here with generalized weakness. Pharmacy consulted to start ceftriaxone for a UTI and resume home Coumadin therapy.   AC:  INR therapeutic at 2.45. H/H 11.8/38.8, Plt 186k. Last dose of Coumadin was yesterday.  Home Coumadin dose: 2.5 mg daily except 5 mg on Thursday  ID: WBC wnl. UA suggestive  of UTI.  Goal of Therapy:  INR 2-3 Monitor platelets by anticoagulation protocol: Yes   Plan:  -Resume home Coumadin dose -Monitor daily PT/INR   Vinnie Level, PharmD., BCPS Clinical Pharmacist Pager [phone]

## 2017-08-24 NOTE — ED Triage Notes (Signed)
Pt arrived via [facility] with a c/c of fall this morning, per family. Family also states the pt has had several falls over the past few days, along with a decreased appetite.  Pt denies injury from fall, stating the chair moved out from beneath him. Pt has no c/o of pain at time of intake, no visible injuries noted. Pt has hx of CVA without residual affects. Pt is alert and oriented x4. EMS cbg 130, other vitals noted in triage charting.
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
## 2017-08-25 DIAGNOSIS — I2510 Atherosclerotic heart disease of native coronary artery without angina pectoris: Secondary | ICD-10-CM | POA: Diagnosis present

## 2017-08-25 DIAGNOSIS — I482 Chronic atrial fibrillation: Secondary | ICD-10-CM | POA: Diagnosis not present

## 2017-08-25 DIAGNOSIS — F339 Major depressive disorder, recurrent, unspecified: Secondary | ICD-10-CM | POA: Diagnosis not present

## 2017-08-25 DIAGNOSIS — R5381 Other malaise: Secondary | ICD-10-CM | POA: Diagnosis not present

## 2017-08-25 DIAGNOSIS — N390 Urinary tract infection, site not specified: Secondary | ICD-10-CM | POA: Diagnosis not present

## 2017-08-25 DIAGNOSIS — E1159 Type 2 diabetes mellitus with other circulatory complications: Secondary | ICD-10-CM | POA: Diagnosis not present

## 2017-08-25 DIAGNOSIS — I480 Paroxysmal atrial fibrillation: Secondary | ICD-10-CM | POA: Diagnosis present

## 2017-08-25 DIAGNOSIS — Z5181 Encounter for therapeutic drug level monitoring: Secondary | ICD-10-CM | POA: Diagnosis not present

## 2017-08-25 DIAGNOSIS — E1122 Type 2 diabetes mellitus with diabetic chronic kidney disease: Secondary | ICD-10-CM | POA: Diagnosis not present

## 2017-08-25 DIAGNOSIS — R630 Anorexia: Secondary | ICD-10-CM | POA: Diagnosis present

## 2017-08-25 DIAGNOSIS — M109 Gout, unspecified: Secondary | ICD-10-CM | POA: Diagnosis not present

## 2017-08-25 DIAGNOSIS — I679 Cerebrovascular disease, unspecified: Secondary | ICD-10-CM | POA: Diagnosis not present

## 2017-08-25 DIAGNOSIS — I739 Peripheral vascular disease, unspecified: Secondary | ICD-10-CM | POA: Diagnosis not present

## 2017-08-25 DIAGNOSIS — Z515 Encounter for palliative care: Secondary | ICD-10-CM | POA: Diagnosis not present

## 2017-08-25 DIAGNOSIS — Z95810 Presence of automatic (implantable) cardiac defibrillator: Secondary | ICD-10-CM | POA: Diagnosis not present

## 2017-08-25 DIAGNOSIS — I429 Cardiomyopathy, unspecified: Secondary | ICD-10-CM | POA: Diagnosis present

## 2017-08-25 DIAGNOSIS — Z888 Allergy status to other drugs, medicaments and biological substances status: Secondary | ICD-10-CM | POA: Diagnosis not present

## 2017-08-25 DIAGNOSIS — R296 Repeated falls: Secondary | ICD-10-CM | POA: Diagnosis present

## 2017-08-25 DIAGNOSIS — I5084 End stage heart failure: Secondary | ICD-10-CM | POA: Diagnosis present

## 2017-08-25 DIAGNOSIS — D631 Anemia in chronic kidney disease: Secondary | ICD-10-CM | POA: Diagnosis present

## 2017-08-25 DIAGNOSIS — E1151 Type 2 diabetes mellitus with diabetic peripheral angiopathy without gangrene: Secondary | ICD-10-CM | POA: Diagnosis present

## 2017-08-25 DIAGNOSIS — N179 Acute kidney failure, unspecified: Secondary | ICD-10-CM | POA: Diagnosis not present

## 2017-08-25 DIAGNOSIS — M2550 Pain in unspecified joint: Secondary | ICD-10-CM | POA: Diagnosis not present

## 2017-08-25 DIAGNOSIS — R627 Adult failure to thrive: Secondary | ICD-10-CM | POA: Diagnosis not present

## 2017-08-25 DIAGNOSIS — E870 Hyperosmolality and hypernatremia: Secondary | ICD-10-CM | POA: Diagnosis not present

## 2017-08-25 DIAGNOSIS — N400 Enlarged prostate without lower urinary tract symptoms: Secondary | ICD-10-CM | POA: Diagnosis present

## 2017-08-25 DIAGNOSIS — E039 Hypothyroidism, unspecified: Secondary | ICD-10-CM | POA: Diagnosis not present

## 2017-08-25 DIAGNOSIS — E785 Hyperlipidemia, unspecified: Secondary | ICD-10-CM | POA: Diagnosis not present

## 2017-08-25 DIAGNOSIS — I5043 Acute on chronic combined systolic (congestive) and diastolic (congestive) heart failure: Secondary | ICD-10-CM | POA: Diagnosis not present

## 2017-08-25 DIAGNOSIS — I130 Hypertensive heart and chronic kidney disease with heart failure and stage 1 through stage 4 chronic kidney disease, or unspecified chronic kidney disease: Secondary | ICD-10-CM | POA: Diagnosis present

## 2017-08-25 DIAGNOSIS — Z7901 Long term (current) use of anticoagulants: Secondary | ICD-10-CM | POA: Diagnosis not present

## 2017-08-25 DIAGNOSIS — I509 Heart failure, unspecified: Secondary | ICD-10-CM | POA: Diagnosis not present

## 2017-08-25 DIAGNOSIS — N184 Chronic kidney disease, stage 4 (severe): Secondary | ICD-10-CM | POA: Diagnosis not present

## 2017-08-25 DIAGNOSIS — I495 Sick sinus syndrome: Secondary | ICD-10-CM | POA: Diagnosis not present

## 2017-08-25 DIAGNOSIS — R0902 Hypoxemia: Secondary | ICD-10-CM | POA: Diagnosis not present

## 2017-08-25 DIAGNOSIS — F329 Major depressive disorder, single episode, unspecified: Secondary | ICD-10-CM | POA: Diagnosis not present

## 2017-08-25 DIAGNOSIS — I25810 Atherosclerosis of coronary artery bypass graft(s) without angina pectoris: Secondary | ICD-10-CM | POA: Diagnosis not present

## 2017-08-25 DIAGNOSIS — Z7401 Bed confinement status: Secondary | ICD-10-CM | POA: Diagnosis not present

## 2017-08-25 DIAGNOSIS — J449 Chronic obstructive pulmonary disease, unspecified: Secondary | ICD-10-CM | POA: Diagnosis not present

## 2017-08-25 DIAGNOSIS — I4891 Unspecified atrial fibrillation: Secondary | ICD-10-CM | POA: Diagnosis not present

## 2017-08-25 DIAGNOSIS — I10 Essential (primary) hypertension: Secondary | ICD-10-CM | POA: Diagnosis not present

## 2017-08-25 LAB — CBC
HCT: 40.9 % (ref 39.0–52.0)
Hemoglobin: 12.5 g/dL — ABNORMAL LOW (ref 13.0–17.0)
MCH: 27.5 pg (ref 26.0–34.0)
MCHC: 30.6 g/dL (ref 30.0–36.0)
MCV: 89.9 fL (ref 78.0–100.0)
PLATELETS: 131 10*3/uL — AB (ref 150–400)
RBC: 4.55 MIL/uL (ref 4.22–5.81)
RDW: 19.6 % — AB (ref 11.5–15.5)
WBC: 10 10*3/uL (ref 4.0–10.5)

## 2017-08-25 LAB — BASIC METABOLIC PANEL
Anion gap: 12 (ref 5–15)
BUN: 39 mg/dL — ABNORMAL HIGH (ref 6–20)
CALCIUM: 8.9 mg/dL (ref 8.9–10.3)
CO2: 21 mmol/L — ABNORMAL LOW (ref 22–32)
CREATININE: 3.32 mg/dL — AB (ref 0.61–1.24)
Chloride: 117 mmol/L — ABNORMAL HIGH (ref 101–111)
GFR calc non Af Amer: 16 mL/min — ABNORMAL LOW (ref >60)
GFR, EST AFRICAN AMERICAN: 19 mL/min — AB (ref >60)
Glucose, Bld: 109 mg/dL — ABNORMAL HIGH (ref 65–99)
Potassium: 4.2 mmol/L (ref 3.5–5.1)
SODIUM: 150 mmol/L — AB (ref 135–145)

## 2017-08-25 LAB — PATHOLOGIST SMEAR REVIEW

## 2017-08-25 LAB — MAGNESIUM: MAGNESIUM: 2 mg/dL (ref 1.7–2.4)

## 2017-08-25 LAB — PHOSPHORUS: PHOSPHORUS: 2.7 mg/dL (ref 2.5–4.6)

## 2017-08-25 LAB — PROTIME-INR
INR: 3.83
PROTHROMBIN TIME: 39 s — AB (ref 11.4–15.2)

## 2017-08-25 LAB — TROPONIN I: Troponin I: 0.22 ng/mL (ref <0.03)

## 2017-08-25 MED ORDER — HYDRALAZINE HCL 20 MG/ML IJ SOLN
10.0000 mg | Freq: Once | INTRAMUSCULAR | Status: AC
  Administered 2017-08-25: 10 mg via INTRAVENOUS
  Filled 2017-08-25: qty 1

## 2017-08-25 MED ORDER — HYDROCODONE-ACETAMINOPHEN 5-325 MG PO TABS
1.0000 | ORAL_TABLET | Freq: Four times a day (QID) | ORAL | Status: DC | PRN
  Administered 2017-08-25: 1 via ORAL
  Filled 2017-08-25: qty 1

## 2017-08-25 MED ORDER — TORSEMIDE 20 MG PO TABS
20.0000 mg | ORAL_TABLET | Freq: Every day | ORAL | Status: DC
  Administered 2017-08-26: 20 mg via ORAL
  Filled 2017-08-25: qty 1

## 2017-08-25 MED ORDER — HYDROCODONE-ACETAMINOPHEN 5-325 MG PO TABS: 2.0000 | ORAL_TABLET | Freq: Once | ORAL | Status: DC

## 2017-08-25 MED ORDER — ENSURE ENLIVE PO LIQD
237.0000 mL | Freq: Two times a day (BID) | ORAL | Status: DC
  Administered 2017-08-25 – 2017-08-26 (×3): 237 mL via ORAL

## 2017-08-25 MED ORDER — FUROSEMIDE 10 MG/ML IJ SOLN: 40.0000 mg | Freq: Every day | INTRAMUSCULAR | Status: DC

## 2017-08-25 MED ORDER — NYSTATIN 100000 UNIT/ML MT SUSP
5.0000 mL | Freq: Four times a day (QID) | OROMUCOSAL | Status: DC
  Administered 2017-08-25 – 2017-08-27 (×7): 500000 [IU] via ORAL
  Filled 2017-08-25 (×7): qty 5

## 2017-08-25 MED ORDER — ORAL CARE MOUTH RINSE
15.0000 mL | Freq: Two times a day (BID) | OROMUCOSAL | Status: DC
  Administered 2017-08-25 – 2017-08-27 (×5): 15 mL via OROMUCOSAL

## 2017-08-25 NOTE — Progress Notes (Signed)
Initial Nutrition Assessment  DOCUMENTATION CODES:   Not applicable  INTERVENTION:  Provide Ensure Enlive po BID, each supplement provides 350 kcal and 20 grams of protein.  Encourage adequate PO intake.   NUTRITION DIAGNOSIS:   Increased nutrient needs related to chronic illness as evidenced by estimated needs.  GOAL:   Patient will meet greater than or equal to 90% of their needs  MONITOR:   PO intake, Supplement acceptance, Labs, Weight trends, Skin, I & O's  REASON FOR ASSESSMENT:   Consult Assessment of nutrition requirement/status  ASSESSMENT:   76 y.o. male with medical history significant of anemia, BPH, CHF, CAD, CVA, depression, DJD, diabetes, gout, hypertension, hyperlipidemia, PAF/an ICM/LBBB/AICD placement, PVD. Presents with AKI on CKD IV.  Pt was unavailable, working with therapy, during time of visit. RD unable to obtain nutrition history. No recent meal completion recorded. Pt with weight loss per weight records, however not found significant for time frame. RD to order nutritional supplements to aid in adequate nutrition.  Unable to complete Nutrition-Focused physical exam at this time.   Labs and medications reviewed. Sodium elevated at 150.   Diet Order:  Diet regular Room service appropriate? Yes; Fluid consistency: Thin  Skin:  Reviewed, no issues  Last BM:  9/5  Height:   Ht Readings from Last 1 Encounters:  08/24/17 6\' 2"  (1.88 m)    Weight:   Wt Readings from Last 1 Encounters:  08/25/17 188 lb 6.4 oz (85.5 kg)    Ideal Body Weight:  86 kg  BMI:  Body mass index is 24.19 kg/m.  Estimated Nutritional Needs:   Kcal:  1900-2100  Protein:  90-105 grams  Fluid:  Per MD  EDUCATION NEEDS:   No education needs identified at this time  Roslyn Smiling, MS, RD, LDN Pager # [phone] After hours/ weekend pager # [phone]

## 2017-08-25 NOTE — Progress Notes (Addendum)
PROGRESS NOTE    Frank WILSEY  ZOX:[id number] DOB: [birth year] DOA: 08/24/2017 PCP: Tresa Garter, MD   Outpatient Specialists:     Brief Narrative:  Frank Mclean is a 76 y.o. male with medical history significant of anemia, BPH, CHF, CAD, CVA, depression, DJD, diabetes, gout, hypertension, hyperlipidemia, PAF/an ICM/LBBB/AICD placement, PVD,. Patient presents with 3-4 week history of progressive generalized weakness and fatigue with significant worsening over the last 1-2 days. Patient's wife states that he's had very foul-smelling urine over the last day. Additionally there are no complaints of chest pain, palpitations, cough, neck stiffness, headache, focal neurological deficit. Patient has had multiple falls but no LOC. Falls are typically from patient losing his balance are becoming weak. Patient also has a had a decrease in his appetite and eats and drinks very little.   Assessment & Plan:   Active Problems:   DM (diabetes mellitus), type 2 with renal complications (HCC)   COPD with chronic bronchitis (HCC)   Anticoagulated on Coumadin   Chronic depression   Hypothyroidism   Acute on chronic combined systolic and diastolic CHF (congestive heart failure) (HCC)   Chronic atrial fibrillation (HCC)   AKI (acute kidney injury) (HCC)   Physical deconditioning   Acute lower UTI   AKI on CKD IV: Cr 3.54 w/ baseline 3. Likely from poor fluid intake vs cardiorenal syndrome from worsening CHF and acute exacerbation. Possibly also affected by concurrent UTI. - daily labs  PAF/NICM/AICD/CHF (EF 15% to 20%. Diffuse hypokinesis.: at baseline) - Chest x-ray and lower extremity worsening edema concerning for CHF exacerbation. - Trop mildly elevated- will trend - continue Amiodarone, coumadin - Lasix IV given-- change to PO demadex-- home dose - I/O, Daily wts.   UTI: UA as above. Foul-smelling urine reported. Evidence of systemic infection. - Urine culture pending -  Ceftriaxone  physical deconditioning: Secondary to age, advanced heart failure and renal disease, lack of adequate nutrition, and UTI. Multiple recent falls. - PT/OT/nutrition consults - Pre-albumin: 23.5  HTN: - continue hydralazine, coreg,  Hypernatremia -encourage PO intake of water -recheck BMP in AM -will avoid IVF  Depression: - continue Wellbutrin  Wife wants patient to go to SNF as she can no handle him at home anymore.  Dialysis has been discussed before but wife says they do not want that.  Palliative care consult placed for GOC.   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  Objective: Vitals:   08/25/17 0435 08/25/17 0500 08/25/17 0627 08/25/17 0656  BP: (!) 145/96  (!) 148/92   Pulse:   70   Resp:   18   Temp:   (!) 97.5 F (36.4 C) 98 F (36.7 C)  TempSrc:   Oral Oral  SpO2:   100%   Weight:  85.5 kg (188 lb 6.4 oz)    Height:        Intake/Output Summary (Last 24 hours) at 08/25/17 1043 Last data filed at 08/24/17 1928  Gross per 24 hour  Intake              550 ml  Output               50 ml  Net  500 ml   Filed Weights   08/24/17 2131 08/25/17 0500  Weight: 85.3 kg (188 lb) 85.5 kg (188 lb 6.4 oz)    Examination:  General exam: Appears calm and comfortable  Respiratory system: diminished, no wheezing Cardiovascular system: rrr Gastrointestinal system: +BS, soft Central nervous system: Alert - answer questions appropriately. Extremities: moves all 4 ext. Skin: No rashes, lesions or ulcers Psychiatry: Judgement and insight appear normal    Data Reviewed: I have personally reviewed following labs and imaging studies  CBC:  Recent Labs Lab 08/24/17 1351 08/25/17 0553  WBC 7.7 10.0  NEUTROABS 6.5  --   HGB 11.8* 12.5*   HCT 38.8* 40.9  MCV 89.8 89.9  PLT 186 131*   Basic Metabolic Panel:  Recent Labs Lab 08/24/17 1351 08/25/17 0553  NA 150* 150*  K 4.2 4.2  CL 116* 117*  CO2 20* 21*  GLUCOSE 104* 109*  BUN 42* 39*  CREATININE 3.54* 3.32*  CALCIUM 9.0 8.9   GFR: Estimated Creatinine Clearance: 21.3 mL/min (A) (by C-G formula based on SCr of 3.32 mg/dL (H)). Liver Function Tests:  Recent Labs Lab 08/24/17 1351  AST 21  ALT 25  ALKPHOS 89  BILITOT 1.4*  PROT 5.8*  ALBUMIN 3.3*   No results for input(s): LIPASE, AMYLASE in the last 168 hours. No results for input(s): AMMONIA in the last 168 hours. Coagulation Profile:  Recent Labs Lab 08/24/17 1351  INR 2.45   Cardiac Enzymes:  Recent Labs Lab 08/24/17 1839  TROPONINI 0.12*   BNP (last 3 results) No results for input(s): PROBNP in the last 8760 hours. HbA1C: No results for input(s): HGBA1C in the last 72 hours. CBG:  Recent Labs Lab 08/24/17 2145  GLUCAP 108*   Lipid Profile: No results for input(s): CHOL, HDL, LDLCALC, TRIG, CHOLHDL, LDLDIRECT in the last 72 hours. Thyroid Function Tests: No results for input(s): TSH, T4TOTAL, FREET4, T3FREE, THYROIDAB in the last 72 hours. Anemia Panel: No results for input(s): VITAMINB12, FOLATE, FERRITIN, TIBC, IRON, RETICCTPCT in the last 72 hours. Urine analysis:    Component Value Date/Time   COLORURINE YELLOW 08/24/2017 1603   APPEARANCEUR HAZY (A) 08/24/2017 1603   LABSPEC 1.014 08/24/2017 1603   PHURINE 5.0 08/24/2017 1603   GLUCOSEU NEGATIVE 08/24/2017 1603   GLUCOSEU NEGATIVE 09/16/2014 1114   HGBUR SMALL (A) 08/24/2017 1603   BILIRUBINUR NEGATIVE 08/24/2017 1603   KETONESUR NEGATIVE 08/24/2017 1603   PROTEINUR 30 (A) 08/24/2017 1603   UROBILINOGEN 0.2 08/12/2015 1148   NITRITE NEGATIVE 08/24/2017 1603   LEUKOCYTESUR LARGE (A) 08/24/2017 1603     )No results found for this or any previous visit (from the past 240 hour(s)).    Anti-infectives    Start      Dose/Rate Route Frequency Ordered Stop   08/24/17 1830  cefTRIAXone (ROCEPHIN) 1 g in dextrose 5 % 50 mL IVPB     1 g 100 mL/hr over 30 Minutes Intravenous Every 24 hours 08/24/17 1816         Radiology Studies: Dg Chest 2 View  Result Date: 08/24/2017 CLINICAL DATA:  Falling.  Weakness EXAM: CHEST  2 VIEW COMPARISON:  04/07/2016 FINDINGS: Cardiac enlargement. AICD in good position. Congestive heart failure with pulmonary edema has increased in the interval. Small bilateral effusions and mild bibasilar atelectasis IMPRESSION: Congestive heart failure with pulmonary edema and small bilateral effusions. Electronically Signed   By: Marlan Palau M.D.   On: 08/24/2017 13:27   Dg Lumbar Spine 2-3 Views  Result Date: 08/24/2017 CLINICAL DATA:  Lumbosacral back pain. Patient fell today. Trying to sit in chair, chair moved and he fell straight down on his sacrum/coccyx. Fell onto concrete. EXAM: LUMBAR SPINE - 2-3 VIEW COMPARISON:  None. FINDINGS: Straightening of normal lordosis. No traumatic subluxation. Advanced disc space narrowing and endplate spurring at L3-L4 and L4-L5. Multilevel facet arthropathy. Vertebral body heights are maintained. No compression fracture. Posterior elements appear intact. The sacroiliac joints are congruent. Abdominal aortic atherosclerosis is incidentally noted. IMPRESSION: Multilevel degenerative change throughout the lumbar spine, no acute fracture. Electronically Signed   By: Rubye Oaks M.D.   On: 08/24/2017 19:42   Ct Head Wo Contrast  Result Date: 08/24/2017 CLINICAL DATA:  Fall earlier today. EXAM: CT HEAD WITHOUT CONTRAST TECHNIQUE: Contiguous axial images were obtained from the base of the skull through the vertex without intravenous contrast. COMPARISON:  March 04, 2016 FINDINGS: Brain: There is mild to moderate diffuse atrophy. There is no intracranial mass, hemorrhage, extra-axial fluid collection, or midline shift. There is a prior lacunar type infarct in  the posterior limb of the right internal capsule and lateral right thalamus, stable. There is patchy small vessel disease throughout the centra semiovale bilaterally. There is no new gray-white compartment lesion. No evident acute infarct. Vascular: There is no hyperdense vessel. There is calcification in each carotid siphon region. There is also calcification in the distal vertebral arteries, more severe on the left than on the right. Skull: Bony calvarium appears intact. Sinuses/Orbits: There is mucosal thickening in several ethmoid air cells bilaterally. Other visualized paranasal sinuses are clear. Orbits appear symmetric bilaterally. Other: Mastoid air cells are clear. IMPRESSION: Atrophy with periventricular small vessel disease, stable. Prior lacunar infarct in the posterior limb of the right internal capsule and lateral right thalamus, stable. No acute infarct evident. No intracranial mass hemorrhage, or extra-axial fluid collection. Foci of arterial vascular calcification noted. There is ethmoid sinus disease. Electronically Signed   By: Bretta Bang III M.D.   On: 08/24/2017 15:47        Scheduled Meds: . amiodarone  200 mg Oral Daily  . buPROPion  150 mg Oral Daily  . carvedilol  3.125 mg Oral BID WC  . furosemide  40 mg Intravenous Q12H  . hydrALAZINE  25 mg Oral BID  . isosorbide mononitrate  30 mg Oral Daily  . levothyroxine  100 mcg Oral QAC breakfast  . mouth rinse  15 mL Mouth Rinse BID  . mometasone-formoterol  2 puff Inhalation BID  . potassium chloride SA  20 mEq Oral Daily  . warfarin  2.5 mg Oral Once per day on Sun Mon Tue Wed Fri Sat  . warfarin  5 mg Oral Once per day on Thu  . Warfarin - Pharmacist Dosing Inpatient   Does not apply q1800   Continuous Infusions: . sodium chloride 50 mL/hr at 08/24/17 1850  . cefTRIAXone (ROCEPHIN)  IV Stopped (08/24/17 1920)     LOS: 0 days    Time spent: 35 min    JESSICA U VANN, DO [facility] Pager  [phone]  If 7PM-7AM, please contact night-coverage [email or web address] Password [email or web address] 08/25/2017, 10:43 AM

## 2017-08-25 NOTE — Progress Notes (Signed)
Occupational Therapy Evaluation Patient Details Name: Frank Mclean: [id number] DOB: Oct 24, 1941 Today's Date: 08/25/2017    History of Present Illness pt is a 76 y/o male with pmh significant for anemi, CHF, CAD, CVA, DJD, DM, HTN, PAF with ICM/AICD, admitted with 3-4 week h/o progressive generalized weakness and fatigue worsening over the last 1-2 days and foul smelling urine recently with decreased liquid and food intake.   Clinical Impression   PTA, pt living at home with wife and required occasional A with ADL and was modified Independent @ RW level, although wife reports 3 falls within last week. On eval, pt required min A to complete sit - stand after 8 trials of attempitng to stand. Pt only able to ambulate 2 5 feet before stating he was too tired and needed to sit. Feel pt would benefit from rehab at SNF. Do not feel pt is safe to DC home due to fall risk and generalized weakness. Will follow acutely to address established goals and facilitate safe DC to next venue of care.     Follow Up Recommendations  SNF;Supervision/Assistance - 24 hour    Equipment Recommendations  Other (comment) (TBA)    Recommendations for Other Services       Precautions / Restrictions Precautions Precautions: Fall Restrictions Weight Bearing Restrictions: No      Mobility Bed Mobility               General bed mobility comments: OOB in chair  Transfers Overall transfer level: Needs assistance Equipment used: Rolling walker (2 wheeled) Transfers: Sit to/from Stand Sit to Stand: Min assist         General transfer comment: cues for hand placement, cues for posture prior to ambulation    Balance Overall balance assessment: Needs assistance;History of Falls Sitting-balance support: Feet supported;No upper extremity supported Sitting balance-Leahy Scale: Fair Sitting balance - Comments: can lean outside BOS, and return to midline   Standing balance support: Bilateral upper  extremity supported;Single extremity supported Standing balance-Leahy Scale: Poor Standing balance comment: needing AD or external support for safety.                           ADL either performed or assessed with clinical judgement   ADL Overall ADL's : Needs assistance/impaired Eating/Feeding: Set up Eating/Feeding Details (indicate cue type and reason): poor po intake per wife. Pt states "it's hard to get food around here" Grooming: Supervision/safety;Set up;Sitting   Upper Body Bathing: Supervision/ safety;Set up;Sitting   Lower Body Bathing: Minimal assistance;Sit to/from stand   Upper Body Dressing : Set up;Supervision/safety;Sitting   Lower Body Dressing: Moderate assistance;Sit to/from stand   Toilet Transfer: Minimal assistance;RW;Ambulation   Toileting- Clothing Manipulation and Hygiene: Minimal assistance Toileting - Clothing Manipulation Details (indicate cue type and reason): foley     Functional mobility during ADLs: Minimal assistance;Rolling walker;Cueing for safety  Only able to ambulate 5 feet before needing to sit; cues to walk in RW. High risk for falls.     Vision Baseline Vision/History: Wears glasses       Perception     Praxis      Pertinent Vitals/Pain Pain Assessment: Faces Faces Pain Scale: No hurt     Hand Dominance Right   Extremity/Trunk Assessment Upper Extremity Assessment Upper Extremity Assessment: Generalized weakness   Lower Extremity Assessment Lower Extremity Assessment: Generalized weakness   Cervical / Trunk Assessment Cervical / Trunk Assessment: Kyphotic   Communication Communication Communication:  No difficulties (soft spoken)   Cognition Arousal/Alertness: Awake/alert Behavior During Therapy: Flat affect;WFL for tasks assessed/performed Overall Cognitive Status: History of cognitive impairments - at baseline                                     General Comments    Wife present at end  of session and stated pt has had increased confusion PTA. States po intake has been very poor.    Exercises     Shoulder Instructions      Home Living Family/patient expects to be discharged to:: Private residence Living Arrangements: Spouse/significant other;Children;Other (Comment) ( and young adult grandchild) Available Help at Discharge: Family;Available 24 hours/day Type of Home: House Home Access: Stairs to enter [employer] of Steps: 6 Entrance Stairs-Rails: Right;Left Home Layout: Two level;Able to live on main level with bedroom/bathroom Alternate Level Stairs-Number of Steps: flight Alternate Level Stairs-Rails: Right;Left Bathroom Shower/Tub: Walk-in shower;Tub/shower unit   Bathroom Toilet: Standard Bathroom Accessibility: Yes How Accessible: Accessible via walker Home Equipment: Shower seat;Grab bars - tub/shower;Walker - 2 wheels;Cane - single point   Additional Comments: uses cane or RW in house.  Leaves home rarely, to go to appt's.  Pt states completes his own ADLs      Prior Functioning/Environment Level of Independence: Independent with assistive device(s)        Comments: wife states pt has recently required more assistance. Wife helps pt in/out of shower        OT Problem List: Decreased strength;Decreased activity tolerance;Impaired balance (sitting and/or standing);Decreased safety awareness;Decreased cognition      OT Treatment/Interventions: Self-care/ADL training;Therapeutic exercise;DME and/or AE instruction;Therapeutic activities;Cognitive remediation/compensation;Patient/family education;Balance training    OT Goals(Current goals can be found in the care plan section) Acute Rehab OT Goals Patient Stated Goal: to get stronger OT Goal Formulation: With patient/family Time For Goal Achievement: 09/08/17 Potential to Achieve Goals: Good  OT Frequency: Min 2X/week   Barriers to D/C:            Co-evaluation               AM-PAC PT "6 Clicks" Daily Activity     Outcome Measure Help from another person eating meals?: None Help from another person taking care of personal grooming?: A Little Help from another person toileting, which includes using toliet, bedpan, or urinal?: A Little Help from another person bathing (including washing, rinsing, drying)?: A Little Help from another person to put on and taking off regular upper body clothing?: A Little Help from another person to put on and taking off regular lower body clothing?: A Little 6 Click Score: 19   End of Session Equipment Utilized During Treatment: Gait belt;Rolling walker;Oxygen (2L) Nurse Communication: Mobility status  Activity Tolerance: Patient tolerated treatment well Patient left: in chair;with call bell/phone within reach;with chair alarm set;with family/visitor present  OT Visit Diagnosis: Unsteadiness on feet (R26.81);Repeated falls (R29.6);Muscle weakness (generalized) (M62.81)                Time: 4098-1191 OT Time Calculation (min): 21 min Charges:  OT General Charges $OT Visit: 1 Visit OT Evaluation $OT Eval Moderate Complexity: 1 Mod G-Codes: OT G-codes **NOT FOR INPATIENT CLASS** Functional Assessment Tool Used: Clinical judgement Functional Limitation: Self care Self Care Current Status (Y7829): At least 20 percent but less than 40 percent impaired, limited or restricted Self Care Goal Status (F6213): At least 1 percent  but less than 20 percent impaired, limited or restricted   [facility], OT/L  [phone] 08/25/2017  Frank Mclean,Frank Mclean 08/25/2017, 3:13 PM

## 2017-08-25 NOTE — Evaluation (Signed)
Physical Therapy Evaluation Patient Details Name: Frank Mclean MRN: [id number] DOB: 06/11/1941 Today's Date: 08/25/2017   History of Present Illness  pt is a 76 y/o male with pmh significant for anemi, CHF, CAD, CVA, DJD, DM, HTN, PAF with ICM/AICD, admitted with 3-4 week h/o progressive generalized weakness and fatigue worsening over the last 1-2 days and foul smelling urine recently with decreased liquid and food intake.  Clinical Impression  Pt admitted with/for general weakness and fatigue and UTI.  Pt needing at least min assist for general mobility and gait.  He states he has 24 hour assist.  Pt currently limited functionally due to the problems listed below.  (see problems list.)  Pt will benefit from PT to maximize function and safety to be able to get home safely with available assist .     Follow Up Recommendations Home health PT;Supervision/Assistance - 24 hour    Equipment Recommendations  None recommended by PT    Recommendations for Other Services       Precautions / Restrictions Precautions Precautions: Fall Restrictions Weight Bearing Restrictions: No      Mobility  Bed Mobility Overal bed mobility: Needs Assistance Bed Mobility: Supine to Sit     Supine to sit: Mod assist     General bed mobility comments: assist to prep for bridging then pt completed with minimal assist.  needed assist to come forward with minimal HOB raised and no rail.  Transfers Overall transfer level: Needs assistance Equipment used: Rolling walker (2 wheeled) Transfers: Sit to/from Stand Sit to Stand: Min assist         General transfer comment: cues for hand placement, cues for posture prior to ambulation  Ambulation/Gait Ambulation/Gait assistance: Min assist;Min guard Ambulation Distance (Feet): 35 Feet Assistive device: Rolling walker (2 wheeled) Gait Pattern/deviations: Step-through pattern Gait velocity: slower Gait velocity interpretation: Below normal speed for  age/gender General Gait Details: slow unequal steps alternating with shuffle and occasional unsafe distances from the RW.  Stairs            Wheelchair Mobility    Modified Rankin (Stroke Patients Only)       Balance Overall balance assessment: Needs assistance Sitting-balance support: Feet supported;No upper extremity supported Sitting balance-Leahy Scale: Fair Sitting balance - Comments: can lean outside BOS, and return to midline   Standing balance support: Bilateral upper extremity supported;Single extremity supported Standing balance-Leahy Scale: Poor Standing balance comment: needing AD or external support for safety.                             Pertinent Vitals/Pain      Home Living Family/patient expects to be discharged to:: Private residence Living Arrangements: Spouse/significant other;Children;Other (Comment) ( and young adult grandchild) Available Help at Discharge: Family;Available 24 hours/day Type of Home: House Home Access: Stairs to enter Entrance Stairs-Rails: Doctor, general practice of Steps: 6 Home Layout: Two level;Able to live on main level with bedroom/bathroom Home Equipment: Shower seat;Grab bars - tub/shower;Walker - 2 wheels;Cane - single point Additional Comments: uses cane or RW in house.  Leaves home rarely, to go to appt's.  Pt states completes his own ADLs    Prior Function Level of Independence: Independent with assistive device(s)         Comments: pt didn't expound on how he had done recently     Hand Dominance   Dominant Hand: Right    Extremity/Trunk Assessment   Upper Extremity Assessment  Upper Extremity Assessment: Defer to OT evaluation    Lower Extremity Assessment Lower Extremity Assessment: Overall WFL for tasks assessed;Generalized weakness (bil proximal weakness, L LE notably weaker than R LE)    Cervical / Trunk Assessment Cervical / Trunk Assessment: Kyphotic  Communication    Communication: No difficulties (soft spoken)  Cognition Arousal/Alertness: Awake/alert Behavior During Therapy: Flat affect;WFL for tasks assessed/performed Overall Cognitive Status: No family/caregiver present to determine baseline cognitive functioning                                 General Comments: a little slowed processing, but eventually arrives at the answer he wants.      General Comments      Exercises     Assessment/Plan    PT Assessment Patient needs continued PT services  PT Problem List Decreased strength;Decreased activity tolerance;Decreased balance;Decreased mobility;Decreased coordination;Decreased knowledge of use of DME       PT Treatment Interventions DME instruction;Gait training;Stair training;Functional mobility training;Therapeutic activities;Balance training;Patient/family education    PT Goals (Current goals can be found in the Care Plan section)  Acute Rehab PT Goals Patient Stated Goal: feel better and do more for myself. PT Goal Formulation: With patient Time For Goal Achievement: 09/08/17 Potential to Achieve Goals: Good    Frequency Min 3X/week   Barriers to discharge        Co-evaluation               AM-PAC PT "6 Clicks" Daily Activity  Outcome Measure Difficulty turning over in bed (including adjusting bedclothes, sheets and blankets)?: A Lot Difficulty moving from lying on back to sitting on the side of the bed? : Unable Difficulty sitting down on and standing up from a chair with arms (e.g., wheelchair, bedside commode, etc,.)?: A Lot Help needed moving to and from a bed to chair (including a wheelchair)?: A Little Help needed walking in hospital room?: A Little Help needed climbing 3-5 steps with a railing? : A Lot 6 Click Score: 13    End of Session   Activity Tolerance: Patient tolerated treatment well Patient left: in chair;with call bell/phone within reach;with chair alarm set Nurse Communication:  Mobility status PT Visit Diagnosis: Unsteadiness on feet (R26.81);Other abnormalities of gait and mobility (R26.89);Muscle weakness (generalized) (M62.81)    Time: 8185-9093 PT Time Calculation (min) (ACUTE ONLY): 38 min   Charges:   PT Evaluation $PT Eval Moderate Complexity: 1 Mod PT Treatments $Gait Training: 8-22 mins $Therapeutic Activity: 8-22 mins   PT G Codes:   PT G-Codes **NOT FOR INPATIENT CLASS** Functional Assessment Tool Used: AM-PAC 6 Clicks Basic Mobility;Clinical judgement Functional Limitation: Mobility: Walking and moving around Mobility: Walking and Moving Around Current Status (J1216): At least 20 percent but less than 40 percent impaired, limited or restricted Mobility: Walking and Moving Around Goal Status [phone]): At least 1 percent but less than 20 percent impaired, limited or restricted    08/25/2017  Deadwood Bing, PT [phone]  (pager)  Eliseo Gum Rosela Supak 08/25/2017, 10:38 AM

## 2017-08-25 NOTE — Progress Notes (Signed)
Pt BP elevated. Schorr, NP paged

## 2017-08-25 NOTE — Care Management Note (Signed)
Case Management Note  Patient Details  Name: KENDERICK KETELSEN MRN: [id number] Date of Birth: Apr 22, 1941  Subjective/Objective:   Presents with AKI, history of anemia, BPH, CHF, CAD, CVA, depression, DJD, diabetes, gout, hypertension, hyperlipidemia, PAF/an ICM/LBBB/AICD placement. From home with wife.  Pricilla Handler Akul Presser (Spouse)      [phone]       PCP: Jacinta Shoe  Action/Plan: SNF vs HH when medically stable. CM following for disposition needs. CSW aware of potential need for SNF placement @ d/c.  Expected Discharge Date:                  Expected Discharge Plan:  Home w Home Health Services  In-House Referral:  Clinical Social Work  Discharge planning Services  CM Consult  Post Acute Care Choice:    Choice offered to:  Spouse  DME Arranged:    DME Agency:     HH Arranged:    HH Agency:  [facility], selected if home health services are  Needed @ d/c.  Status of Service:  In process, will continue to follow  If discussed at Long Length of Stay Meetings, dates discussed:    Additional Comments:  Epifanio Lesches, RN 08/25/2017, 3:52 PM

## 2017-08-25 NOTE — Progress Notes (Signed)
ANTICOAGULATION CONSULT NOTE - Follow Up Consult  Pharmacy Consult for Coumadin Indication: atrial fibrillation  Allergies  Allergen Reactions  . Levothyroxine Sodium Other (See Comments)    MUST TAKE BRAND NAME GENERIC DOESN'T WORK FOR PATIENT    Patient Measurements: Height: 6\' 2"  (188 cm) Weight: 188 lb 6.4 oz (85.5 kg) IBW/kg (Calculated) : 82.2  Vital Signs: Temp: 98 F (36.7 C) (09/06 0656) Temp Source: Oral (09/06 0656) BP: 148/92 (09/06 0627) Pulse Rate: 70 (09/06 0627)  Labs:  Recent Labs  08/24/17 1351 08/24/17 1839 08/25/17 0553 08/25/17 1027  HGB 11.8*  --  12.5*  --   HCT 38.8*  --  40.9  --   PLT 186  --  131*  --   LABPROT 26.4*  --   --  39.0*  INR 2.45  --   --  3.83  CREATININE 3.54*  --  3.32*  --   TROPONINI  --  0.12*  --   --     Estimated Creatinine Clearance: 21.3 mL/min (A) (by C-G formula based on SCr of 3.32 mg/dL (H)).   Medications:  Scheduled:  . amiodarone  200 mg Oral Daily  . buPROPion  150 mg Oral Daily  . carvedilol  3.125 mg Oral BID WC  . hydrALAZINE  25 mg Oral BID  . isosorbide mononitrate  30 mg Oral Daily  . levothyroxine  100 mcg Oral QAC breakfast  . mouth rinse  15 mL Mouth Rinse BID  . mometasone-formoterol  2 puff Inhalation BID  . potassium chloride SA  20 mEq Oral Daily  . [START ON 08/26/2017] torsemide  20 mg Oral Daily  . warfarin  2.5 mg Oral Once per day on Sun Mon Tue Wed Fri Sat  . warfarin  5 mg Oral Once per day on Thu  . Warfarin - Pharmacist Dosing Inpatient   Does not apply q1800    Assessment: 76 yo M admitted 9/5 with generalized weakness.  He is on Coumadin PTA for hx afib.  INR was therapeutic on admission, now above goal.  No bleeding noted.  Goal of Therapy:  INR 2-3 Monitor platelets by anticoagulation protocol: Yes   Plan:  No Coumadin tonight. Continue daily INR.  [employer], Pharm.D., BCPS Clinical Pharmacist Pager: [phone] Clinical phone for 08/25/2017 from  8:30-4:00 is [phone]. After 4pm, please call Main Rx [phone]) for assistance. 08/25/2017 12:34 PM

## 2017-08-26 DIAGNOSIS — J449 Chronic obstructive pulmonary disease, unspecified: Secondary | ICD-10-CM

## 2017-08-26 DIAGNOSIS — I5043 Acute on chronic combined systolic (congestive) and diastolic (congestive) heart failure: Secondary | ICD-10-CM

## 2017-08-26 DIAGNOSIS — R627 Adult failure to thrive: Secondary | ICD-10-CM

## 2017-08-26 DIAGNOSIS — E039 Hypothyroidism, unspecified: Secondary | ICD-10-CM

## 2017-08-26 DIAGNOSIS — Z7901 Long term (current) use of anticoagulants: Secondary | ICD-10-CM

## 2017-08-26 DIAGNOSIS — E870 Hyperosmolality and hypernatremia: Secondary | ICD-10-CM

## 2017-08-26 DIAGNOSIS — N179 Acute kidney failure, unspecified: Secondary | ICD-10-CM

## 2017-08-26 DIAGNOSIS — R5381 Other malaise: Secondary | ICD-10-CM

## 2017-08-26 DIAGNOSIS — I482 Chronic atrial fibrillation: Secondary | ICD-10-CM

## 2017-08-26 DIAGNOSIS — N390 Urinary tract infection, site not specified: Secondary | ICD-10-CM

## 2017-08-26 DIAGNOSIS — F329 Major depressive disorder, single episode, unspecified: Secondary | ICD-10-CM

## 2017-08-26 DIAGNOSIS — N184 Chronic kidney disease, stage 4 (severe): Secondary | ICD-10-CM

## 2017-08-26 DIAGNOSIS — E1122 Type 2 diabetes mellitus with diabetic chronic kidney disease: Secondary | ICD-10-CM

## 2017-08-26 DIAGNOSIS — Z5181 Encounter for therapeutic drug level monitoring: Secondary | ICD-10-CM

## 2017-08-26 LAB — CBC
HCT: 38 % — ABNORMAL LOW (ref 39.0–52.0)
Hemoglobin: 11.5 g/dL — ABNORMAL LOW (ref 13.0–17.0)
MCH: 27.2 pg (ref 26.0–34.0)
MCHC: 30.3 g/dL (ref 30.0–36.0)
MCV: 89.8 fL (ref 78.0–100.0)
PLATELETS: 139 10*3/uL — AB (ref 150–400)
RBC: 4.23 MIL/uL (ref 4.22–5.81)
RDW: 19.6 % — ABNORMAL HIGH (ref 11.5–15.5)
WBC: 10.4 10*3/uL (ref 4.0–10.5)

## 2017-08-26 LAB — BASIC METABOLIC PANEL
ANION GAP: 12 (ref 5–15)
BUN: 40 mg/dL — ABNORMAL HIGH (ref 6–20)
CHLORIDE: 115 mmol/L — AB (ref 101–111)
CO2: 22 mmol/L (ref 22–32)
Calcium: 9.1 mg/dL (ref 8.9–10.3)
Creatinine, Ser: 3.42 mg/dL — ABNORMAL HIGH (ref 0.61–1.24)
GFR calc Af Amer: 18 mL/min — ABNORMAL LOW (ref >60)
GFR, EST NON AFRICAN AMERICAN: 16 mL/min — AB (ref >60)
Glucose, Bld: 133 mg/dL — ABNORMAL HIGH (ref 65–99)
POTASSIUM: 4.2 mmol/L (ref 3.5–5.1)
SODIUM: 149 mmol/L — AB (ref 135–145)

## 2017-08-26 LAB — TROPONIN I: TROPONIN I: 0.22 ng/mL — AB (ref <0.03)

## 2017-08-26 LAB — PROTIME-INR
INR: 4.26
PROTHROMBIN TIME: 40.6 s — AB (ref 11.4–15.2)

## 2017-08-26 MED ORDER — NYSTATIN 100000 UNIT/ML MT SUSP
5.0000 mL | Freq: Four times a day (QID) | OROMUCOSAL | Status: DC
Start: 2017-08-26 — End: 2017-08-26

## 2017-08-26 MED ORDER — CEFUROXIME AXETIL 250 MG PO TABS
250.0000 mg | ORAL_TABLET | Freq: Every day | ORAL | Status: DC
  Administered 2017-08-27: 250 mg via ORAL
  Filled 2017-08-26: qty 1

## 2017-08-26 MED ORDER — CEFUROXIME AXETIL 250 MG PO TABS
250.0000 mg | ORAL_TABLET | Freq: Two times a day (BID) | ORAL | Status: DC
  Filled 2017-08-26: qty 1

## 2017-08-26 MED ORDER — DEXTROSE 5 % IV SOLN
INTRAVENOUS | Status: DC
  Administered 2017-08-26: 11:00:00 via INTRAVENOUS

## 2017-08-26 MED ORDER — FUROSEMIDE 10 MG/ML IJ SOLN
80.0000 mg | Freq: Two times a day (BID) | INTRAMUSCULAR | Status: DC
  Administered 2017-08-26: 80 mg via INTRAVENOUS
  Filled 2017-08-26 (×2): qty 8

## 2017-08-26 NOTE — Progress Notes (Signed)
ANTICOAGULATION CONSULT NOTE - Follow Up Consult  Pharmacy Consult for Coumadin Indication: atrial fibrillation  Allergies  Allergen Reactions  . Levothyroxine Sodium Other (See Comments)    MUST TAKE BRAND NAME GENERIC DOESN'T WORK FOR PATIENT    Patient Measurements: Height: 6\' 2"  (188 cm) Weight: 191 lb (86.6 kg) IBW/kg (Calculated) : 82.2  Vital Signs: Temp: 97.8 F (36.6 C) (09/07 0508) Temp Source: Oral (09/07 0508) BP: 143/97 (09/07 0909) Pulse Rate: 70 (09/07 0510)  Labs:  Recent Labs  08/24/17 1351 08/24/17 1839 08/25/17 0553 08/25/17 1027 08/25/17 1924 08/26/17 0234  HGB 11.8*  --  12.5*  --   --  11.5*  HCT 38.8*  --  40.9  --   --  38.0*  PLT 186  --  131*  --   --  139*  LABPROT 26.4*  --   --  39.0*  --  40.6*  INR 2.45  --   --  3.83  --  4.26*  CREATININE 3.54*  --  3.32*  --   --  3.42*  TROPONINI  --  0.12*  --   --  0.22* 0.22*    Estimated Creatinine Clearance: 20.7 mL/min (A) (by C-G formula based on SCr of 3.42 mg/dL (H)).   Medications:  Scheduled:  . amiodarone  200 mg Oral Daily  . buPROPion  150 mg Oral Daily  . carvedilol  3.125 mg Oral BID WC  . feeding supplement (ENSURE ENLIVE)  237 mL Oral BID BM  . hydrALAZINE  25 mg Oral BID  . HYDROcodone-acetaminophen  2 tablet Oral Once  . isosorbide mononitrate  30 mg Oral Daily  . levothyroxine  100 mcg Oral QAC breakfast  . mouth rinse  15 mL Mouth Rinse BID  . mometasone-formoterol  2 puff Inhalation BID  . nystatin  5 mL Oral QID  . potassium chloride SA  20 mEq Oral Daily  . torsemide  20 mg Oral Daily  . Warfarin - Pharmacist Dosing Inpatient   Does not apply q1800    Assessment: 76 yo M admitted 9/5 with generalized weakness.  He is on Coumadin PTA for hx afib.  INR was therapeutic on admission, now above goal and trending up.  No bleeding noted.  No new drug interactions noted (was on Amio PTA).  Goal of Therapy:  INR 2-3 Monitor platelets by anticoagulation protocol:  Yes   Plan:  No Coumadin tonight. Continue daily INR.  [employer], Pharm.D., BCPS Clinical Pharmacist Pager: [phone] Clinical phone for 08/26/2017 from 8:30-4:00 is [phone]. After 4pm, please call Main Rx [phone]) for assistance. 08/26/2017 10:02 AM

## 2017-08-26 NOTE — Consult Note (Signed)
Advanced Heart Failure Team Consult Note   Primary Physician: Dr Posey Rea Primary Cardiologist:  Dr Shirlee Latch   Reason for Consultation: Heart Failure  HPI:    Frank Mclean is seen today for evaluation of heart failure  at the request of Dr Janee Morn.   Frank Mclean is a 76 year old with history of chronic systolic heart failure, medtronic ICD, HTN, PAF, CVA/TIA, CKD Stage III, LBBB, and hypothyroidism.  He has been followed in the HF clinic and was last seen the end May. At that time he was stable with no medication changes. Weight at that time was 193 pounds.   Prior to admit he had some falls over the last week. Poor appetite. Requires assistance with ADLs. Requires assistance to walk. His wife reports a steady decline.   Admitted with with UTI, AKI/CKD, and falls. CT of head was negative. Lumbar Xray- with degenerative changes.  Started on ceftriaxone. CXR with pulmonary edema and small bilateral effusions. Pertinent admission labs include: sodium 150, K 4.2, creatinine 3.54, hgb 11.8, WBC 7.7, and troponin 0.12. Also UA with leukocytes. Urine culture pending.   Medtronic Optivol: Fluid threshold well above threshold since the end of July. No arrhythmias.     02/2017 ECHO EF 20% Grade III DD Peak PA pressure 53 mm hg   Review of Systems: [y] = yes,  = no   General: Weight gain ; Weight loss ; Anorexia ; Fatigue [Y ]; Fever ; Chills ; Weakness   Cardiac: Chest pain/pressure ; Resting SOB ; Exertional SOB [ Y]; Orthopnea ; Pedal Edema ; Palpitations ; Syncope ; Presyncope ; Paroxysmal nocturnal dyspnea[ ]   Pulmonary: Cough ; Wheezing[ ] ; Hemoptysis[ ] ; Sputum ; Snoring   GI: Vomiting[ ] ; Dysphagia[ ] ; Melena[ ] ; Hematochezia ; Heartburn[ ] ; Abdominal pain ; Constipation ; Diarrhea ; BRBPR   GU: Hematuria[ ] ; Dysuria ; Nocturia[ ]   Vascular: Pain in legs with walking ; Pain in feet with lying flat ; Non-healing  sores ; Stroke [ Y]; TIA [Y ]; Slurred speech ;  Neuro: Headaches[ ] ; Vertigo[ ] ; Seizures[ ] ; Paresthesias[ ] ;Blurred vision ; Diplopia ; Vision changes   Ortho/Skin: Arthritis ; Joint pain [Y ]; Muscle pain ; Joint swelling ; Back Pain [ Y]; Rash   Psych: Depression[Y]; Anxiety[ ]   Heme: Bleeding problems ; Clotting disorders ; Anemia   Endocrine: Diabetes ; Thyroid dysfunction[Y ]  Home Medications Prior to Admission medications   Medication Sig Start Date End Date Taking? Authorizing Provider  acetaminophen (TYLENOL) 325 MG tablet Take 1 tablet (325 mg total) by mouth every 6 (six) hours as needed for mild pain (or Fever >/= 101). 03/07/16  Yes Elgergawy, Leana Roe, MD  amiodarone (PACERONE) 200 MG tablet Take 1 tablet (200 mg total) by mouth daily. Take 200 mg amiodarone (one tablet) by mouth 6 days a week.  Do not take on Sunday. 08/12/17  Yes Marinus Maw, MD  budesonide-formoterol (SYMBICORT) 160-4.5 MCG/ACT inhaler INHALE 2 PUFFS INTO THE LUNGS 2 TIMES DAILY 12/24/16  Yes Plotnikov, Georgina Quint, MD  buPROPion (WELLBUTRIN XL) 150 MG 24 hr tablet Take 1 tablet (150 mg total) by mouth daily. 08/12/17  Yes Plotnikov, Georgina Quint, MD  carvedilol (COREG) 6.25 MG tablet Take 0.5 tablets (3.125 mg  total) by mouth 2 (two) times daily. 08/12/17  Yes Marinus Maw, MD  [employer] VITAMIN B12 1000 MCG tablet TAKE 1 TABLET BY MOUTH DAILY 12/19/16  Yes Plotnikov, Georgina Quint, MD  [employer] VITAMIN D3 1000 units capsule TAKE 1 TABLET (1,000 UNITS TOTAL) BY MOUTH DAILY. 02/17/17  Yes Plotnikov, Georgina Quint, MD  ferrous sulfate 325 (65 FE) MG tablet TAKE 1 TABLET (325 MG TOTAL) BY MOUTH DAILY. 02/17/17  Yes Plotnikov, Georgina Quint, MD  hydrALAZINE (APRESOLINE) 25 MG tablet Take 1 tablet (25 mg total) by mouth 2 (two) times daily. 08/12/17  Yes Plotnikov, Georgina Quint, MD  isosorbide mononitrate (IMDUR) 30 MG 24 hr tablet Take 1 tablet (30 mg total) by mouth daily. 02/04/17  Yes Laurey Morale, MD    levothyroxine (SYNTHROID, LEVOTHROID) 100 MCG tablet TAKE 2 TABLETS BY MOUTH EVERY MORNING BEFORE BREAKFAST Patient taking differently: TAKE 200 mg TABLETS BY MOUTH EVERY MORNING BEFORE BREAKFAST 04/18/17  Yes Plotnikov, Georgina Quint, MD  Menthol, Topical Analgesic, (BIOFREEZE) 4 % GEL Apply topically as needed. Apply to left shoulder and left ankle    Yes [provider]  nystatin (MYCOSTATIN) 100000 UNIT/ML suspension Take 5 mLs by mouth 4 (four) times daily. 08/12/17  Yes [provider]  potassium chloride SA (K-DUR,KLOR-CON) 20 MEQ tablet Take 1 tablet (20 mEq total) by mouth 2 (two) times daily. Patient taking differently: Take 20 mEq by mouth daily.  06/20/17  Yes Plotnikov, Georgina Quint, MD  torsemide (DEMADEX) 20 MG tablet Take 1 tablet (20 mg total) by mouth daily. Take one at lunch 08/12/17  Yes Plotnikov, Georgina Quint, MD  triamcinolone cream (KENALOG) 0.5 % Apply 1 application topically 2 (two) times daily as needed (itching).   Yes [provider]  warfarin (COUMADIN) 5 MG tablet TAKE 1/2 OR 1 TABLET BY MOUTH DAILY OR AS DIRECTED BY COUMADIN CLINIC. Patient taking differently: TAKE 2.5 mg every day except thursday. on Thursday he takes 5 mg 08/12/17  Yes Laurey Morale, MD    Past Medical History: Past Medical History:  Diagnosis Date  . Acute cystitis without hematuria   . AICD (automatic cardioverter/defibrillator) present   . Anemia in chronic renal disease   . Anemia of chronic disease   . Anticoagulated on Coumadin   . Bilateral leg ulcer (HCC)    ACHILLES AREA--  NONHEALING  . BPH (benign prostatic hypertrophy)   . CAD (coronary artery disease)    a. LHC 4/12: Mid LAD 25%, mid diagonal 30%, AV circumflex 40%, proximal OM 25%, distal RCA 40%  . Chronic combined systolic and diastolic CHF (congestive heart failure) (HCC)   . Chronic combined systolic and diastolic heart failure (HCC)    a. Echo 05/2013: EF 25-30%, diffuse HK, restrictive physiology, trivial  AI, mild Frank, moderate LAE, reduced RV systolic function, PASP 42  . Chronic depression   . CKD (chronic kidney disease) stage 3, GFR 30-59 ml/min   . 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 DJD (degenerative joint disease)   . DM (diabetes mellitus), type 2 with renal complications (HCC)   . Elevated troponin   . GERD (gastroesophageal reflux disease)   . Gout   . Gout   . HTN (hypertension)   . Hx of cardiovascular stress test    Nuclear Stress Test (1/16): High risk stress nuclear study with a large, severe, partially reversible inferior  and apical defect consistent with prior inferior and apical infarct; mild apical ischemia; severe LVE; study high risk due to reduced LV function.  EF 25% >> reviewed with Dr. Antoine Poche >> medical mgmt  . Hyperbilirubinemia   . Hyperkalemia   . Hyperlipidemia   . Hypernatremia   . Hypokalemia   . Hypokalemia   . Hypothyroidism   . LBBB (left bundle branch block)   . Loose stools   . Memory loss   . NICM (nonischemic cardiomyopathy) (HCC)    a. EF 25-30%.  . On home oxygen therapy    "2L at night" (04/06/2016)  . Osteoarthritis   . PAF (paroxysmal atrial fibrillation) (HCC)    a. amiodarone Rx started 05/2013;  b. chronic coumadin  . Peripheral vascular disease (HCC)   . Physical deconditioning   . Pneumonia   . Presence of permanent cardiac pacemaker   . Shortness of breath dyspnea     Past Surgical History: Past Surgical History:  Procedure Laterality Date  . ABDOMINAL AORTAGRAM N/A 05/18/2013   Procedure: ABDOMINAL Ronny Flurry;  Surgeon: Sherren Kerns, MD;  Location: [facility] CATH LAB;  Service: Cardiovascular;  Laterality: N/A;  . AORTOGRAM W/ BILATERAL LOWER EXTREMITIY RUNOFF  05-18-2013  DR FIELDS   LEFT LEG OCCLUDED PERONEAL AND ANTERIOR TIBIAL ARTERIES/ HIGH GRADE STENOIS 80% MIDDLE AND DISTAL THIRD OF POSTERIOR TIBIAL ARTERY/ RIGHT PERONEAL AND  ANTERIOR TIBIAL ARTERY OCCLUDED/ 40% STENOSIS DISTALLY   . BI-VENTRICULAR IMPLANTABLE CARDIOVERTER DEFIBRILLATOR N/A 03/12/2015   Procedure: BI-VENTRICULAR IMPLANTABLE CARDIOVERTER DEFIBRILLATOR  (CRT-D);  Surgeon: Marinus Maw, MD;  Location: [facility] CATH LAB;  Service: Cardiovascular;  Laterality: N/A;  . CARDIAC CATHETERIZATION  04-16-2011   DR HOCHREIN   NON-OBSTRUCTIVE CAD. MILDLY ELEVATED PULMONARY PRESSURES/ ELEVATED  END-DIASTOLIC PRESSURE  . CARDIOVERSION N/A 04/12/2016   Procedure: CARDIOVERSION;  Surgeon: Wendall Stade, MD;  Location: [facility] ENDOSCOPY;  Service: Cardiovascular;  Laterality: N/A;  . EPICARDIAL PACING LEAD PLACEMENT N/A 08/21/2015   Procedure: EPICARDIAL PACING LEAD PLACEMENT;  Surgeon: Alleen Borne, MD;  Location: [facility] OR;  Service: Thoracic;  Laterality: N/A;  . LOWER EXTREMITY ANGIOGRAM Bilateral 05/18/2013   Procedure: LOWER EXTREMITY ANGIOGRAM;  Surgeon: Sherren Kerns, MD;  Location: [facility] CATH LAB;  Service: Cardiovascular;  Laterality: Bilateral;  . RIGHT HEART CATHETERIZATION N/A 02/13/2015   Procedure: RIGHT HEART CATH;  Surgeon: Laurey Morale, MD;  Location: [facility] CATH LAB;  Service: Cardiovascular;  Laterality: N/A;  . THORACOTOMY Left 08/21/2015   Procedure: MINI/LIMITED THORACOTOMY;  Surgeon: Alleen Borne, MD;  Location: [facility] OR;  Service: Thoracic;  Laterality: Left;  . TRANSTHORACIC ECHOCARDIOGRAM  12-31-2010   MODERATE CONCENTRIC LVH/ SYSTOLIC FUNCTION SEVERELY REDUCED/ EF 25-30%/  SEVERE HYPOKINESIS OF ANTEROSEPTAL MYOCARDIUM  AND ENTIREAPICAL MYOCARDIUM /  MODERATE HYPOKINESIS OF LATERAL, INFEROLATERAL, INFERIOR,AND INFEROSEPTAL MYOCARIUM/  GRADE 3 DIASTOLIC DYSFUNCTION/ MILD Frank    Family History: Family History  Problem Relation Age of Onset  . Hypertension Mother   . Heart disease Mother   . Diabetes Mother   . Hypertension Father   . Heart disease Father   . Heart attack Maternal Grandmother   . Gout Other   . Stroke Other   . Thyroid disease Neg Hx      Social History: Social History   Social History  . Marital status: Married    Spouse name: N/A  . Number of children: N/A  . Years of education: N/A   Occupational History  . Retired - Working in Product manager Retired  Social History Main Topics  . Smoking status: Former Smoker    Quit date: 04/12/1974  . Smokeless tobacco: Never Used  . Alcohol use No     Comment: previously drank - "love cognac" quit 15 yrs ago.  . Drug use: No  . Sexual activity: No   Other Topics Concern  . None   Social History Narrative   Worked at [employer] year.  He is married and lives with his wife [name] in [place].  Has one daughter Lowella Bandy    Allergies:  Allergies  Allergen Reactions  . Levothyroxine Sodium Other (See Comments)    MUST TAKE BRAND NAME GENERIC DOESN'T WORK FOR PATIENT    Objective:    Vital Signs:   Temp:  [97.5 F (36.4 C)-97.8 F (36.6 C)] 97.8 F (36.6 C) (09/07 0508) Pulse Rate:  [68-70] 70 (09/07 0510) Resp:  [16-18] 16 (09/07 0508) BP: (127-153)/(93-103) 143/97 (09/07 0909) SpO2:  [94 %-100 %] 96 % (09/07 0952) Weight:  [191 lb (86.6 kg)] 191 lb (86.6 kg) (09/07 0508) Last BM Date: 08/24/17  Weight change: Filed Weights   08/24/17 2131 08/25/17 0500 08/26/17 0508  Weight: 188 lb (85.3 kg) 188 lb 6.4 oz (85.5 kg) 191 lb (86.6 kg)    Intake/Output:   Intake/Output Summary (Last 24 hours) at 08/26/17 1346 Last data filed at 08/26/17 0240  Gross per 24 hour  Intake                0 ml  Output              300 ml  Net             -300 ml      Physical Exam    General:  Elderly. Appears frail.  No resp difficulty HEENT: normal Neck: supple. JVP to jaw  . Carotids 2+ bilat; no bruits. No lymphadenopathy or thyromegaly appreciated. Cor: PMI nondisplaced. Regular rate & rhythm. No rubs, or murmurs.+ S3 Lungs: clear Abdomen: soft, nontender, nondistended. No hepatosplenomegaly. No bruits or masses. Good bowel sounds. Extremities: no  cyanosis, clubbing, rash, R and LLE 1+ edema Neuro: alert & orientedx3, cranial nerves grossly intact. moves all 4 extremities w/o difficulty. Affect pleasant   Telemetry  A sensed V paced 60-70s    EKG    No EKG on admit   Labs   Basic Metabolic Panel:  Recent Labs Lab 08/24/17 1351 08/25/17 0553 08/25/17 1027 08/26/17 0234  NA 150* 150*  --  149*  K 4.2 4.2  --  4.2  CL 116* 117*  --  115*  CO2 20* 21*  --  22  GLUCOSE 104* 109*  --  133*  BUN 42* 39*  --  40*  CREATININE 3.54* 3.32*  --  3.42*  CALCIUM 9.0 8.9  --  9.1  MG  --   --  2.0  --   PHOS  --   --  2.7  --     Liver Function Tests:  Recent Labs Lab 08/24/17 1351  AST 21  ALT 25  ALKPHOS 89  BILITOT 1.4*  PROT 5.8*  ALBUMIN 3.3*   No results for input(s): LIPASE, AMYLASE in the last 168 hours. No results for input(s): AMMONIA in the last 168 hours.  CBC:  Recent Labs Lab 08/24/17 1351 08/25/17 0553 08/26/17 0234  WBC 7.7 10.0 10.4  NEUTROABS 6.5  --   --   HGB 11.8* 12.5* 11.5*  HCT 38.8*  40.9 38.0*  MCV 89.8 89.9 89.8  PLT 186 131* 139*    Cardiac Enzymes:  Recent Labs Lab 08/24/17 1839 08/25/17 1924 08/26/17 0234  TROPONINI 0.12* 0.22* 0.22*    BNP: BNP (last 3 results)  Recent Labs  09/07/16 1517 02/04/17 1116  BNP 463.1* 581.8*    ProBNP (last 3 results) No results for input(s): PROBNP in the last 8760 hours.   CBG:  Recent Labs Lab 08/24/17 2145  GLUCAP 108*    Coagulation Studies:  Recent Labs  08/24/17 1351 08/25/17 1027 08/26/17 0234  LABPROT 26.4* 39.0* 40.6*  INR 2.45 3.83 4.26*     Imaging    No results found.   Medications:     Current Medications: . amiodarone  200 mg Oral Daily  . buPROPion  150 mg Oral Daily  . carvedilol  3.125 mg Oral BID WC  . feeding supplement (ENSURE ENLIVE)  237 mL Oral BID BM  . hydrALAZINE  25 mg Oral BID  . HYDROcodone-acetaminophen  2 tablet Oral Once  . isosorbide mononitrate  30 mg Oral  Daily  . levothyroxine  100 mcg Oral QAC breakfast  . mouth rinse  15 mL Mouth Rinse BID  . mometasone-formoterol  2 puff Inhalation BID  . nystatin  5 mL Oral QID  . potassium chloride SA  20 mEq Oral Daily  . torsemide  20 mg Oral Daily  . Warfarin - Pharmacist Dosing Inpatient   Does not apply q1800     Infusions: . cefTRIAXone (ROCEPHIN)  IV Stopped (08/25/17 1731)  . dextrose 50 mL/hr at 08/26/17 1118       Patient Profile  Frank Mclean is a 76 year old with history of chronic systolic heart failure, medtronic ICD, HTN, PAF, CVA/TIA, CKD Stage III, LBBB, and hypothyroidism.  Admitted with with UTI, AKI/CKD, and falls  Assessment/Plan   1. UTI- urine cx pending. On rocephin.  2. AKI on CKD Stage IV- baseline creatinine2.8-3 Creatinine on admit 3.7.  3. Falls- CT head and lumbar xray negative. PT/OT consulted recommendations for SNF  4. Failure to Thrive- Palliative Care Consulted 5. A/C Systolic/Diastolic Heart Failure  - medtronic ICD. ECHO  02/2017 EF 15-20%  Volume status elevated. Diurese with IV lasix.  Continue bb and hydralazine/imdur.  6. PAF- maintaining NSR on device interrogation.  7. Hypothyroidism- on synthroid 7. DNR- Participated in family goals of care. Wife requesting Hospice. Plan to deactivate device. May need [facility].    Length of Stay: 1  Amy Clegg, NP  08/26/2017, 1:46 PM  Advanced Heart Failure Team Pager [phone] (M-F; 7a - 4p)  Please contact [facility] for night-coverage after hours (4p -7a ) and weekends on [email or web address]  Patient seen and examined with Tonye Becket, NP. We discussed all aspects of the encounter. I agree with the assessment and plan as stated above.   He continues to deteriorate in the setting of end-stage HF and progressive renal failure. Also has significant dementia, weakness and failure to thrive. Long talk with patient, family and Hospice team. I think he is nearing the end of his life and needs  residential Hospice. Will restart IV lasix for ongoing diuresis. Deactivate ICD. Discussed with Dr. Janee Morn. We will follow at a distance.   Arvilla Meres, MD  9:32 PM

## 2017-08-26 NOTE — Progress Notes (Signed)
CRITICAL VALUE ALERT  Critical Value:  INR 4.26  Date & Time Notied:  08-26-17  03:27 am  Provider Notified: L. Easterwood   Orders Received/Actions taken: Monitor for bleeding. Will hold coumadin.

## 2017-08-26 NOTE — Progress Notes (Signed)
PROGRESS NOTE    Frank Mclean  FSE:[id number] DOB: 19-Sep-1941 DOA: 08/24/2017 PCP: Tresa Garter, MD    Brief Narrative:  Frank Mclean a 76 y.o.malewith medical history significant of anemia, BPH, CHF, CAD, CVA, depression, DJD, diabetes, gout, hypertension, hyperlipidemia, PAF/an ICM/LBBB/AICD placement, PVD,. Patient presents with 3-4 week history of progressive generalized weakness and fatigue with significant worsening over the last 1-2 days. Patient's wife states that he's had very foul-smelling urine over the last day. Additionally there are no complaints of chest pain, palpitations, cough, neck stiffness, headache, focal neurological deficit. Patient has had multiple falls but no LOC. Falls are typically from patient losing his balance are becoming weak. Patient also has a had a decrease in his appetite and eats and drinks very little.    Assessment & Plan:   Active Problems:   DM (diabetes mellitus), type 2 with renal complications (HCC)   COPD with chronic bronchitis (HCC)   Anticoagulated on Coumadin   Chronic depression   Hypothyroidism   Acute on chronic combined systolic and diastolic CHF (congestive heart failure) (HCC)   Chronic atrial fibrillation (HCC)   AKI (acute kidney injury) (HCC)   Physical deconditioning   Acute lower UTI  #1 urinary tract infection Urine cultures with no significant growth. Patient on IV Rocephin. Will transition to oral Ceftin and treat empirically.  #2 acute on chronic combined systolic and diastolic heart failure Patient admitted with acute on chronic systolic and diastolic heart failure. Patient initially placed on IV Lasix and subsequently transitioned to oral Demadex. Patient with poor urine output. Per last 2-D echo from 03/03/2017 patient with a EF of 15-20%, diffuse hypokinesis, grade 3 diastolic dysfunction and being followed by the heart failure team. Patient has been deteriorating recently. We'll consulted the heart  failure team for further evaluation and management. Patient likely with end-stage heart failure and probably may benefit from comfort measures.  #3 COPD Stable. Continue Dulera  #4 chronic atrial fibrillation Currently rate controlled on Coreg. Continue amiodarone. INR supratherapeutic and a such will hold Coumadin tonight. Coumadin per pharmacy for anticoagulation.  #5 depression Stable. Continue Wellbutrin.  #6 nonischemic cardiomyopathy/status post AICD Patient with EF of 15-20% with diffuse hypokinesis. 2-D echo from 03/03/2017. Patient has been deteriorating recently. Patient likely with end-stage CHF. Continue hydralazine,imdur, amiodarone, Coreg, diuretics. Consult with cardiology/heart failure team.  #7 debility/physical deconditioning Patient deteriorating per family. Patient with a poor appetite and failure to thrive symptoms. Patient with advanced heart failure likely now with end-stage heart failure. Patient with history of renal disease. Patient now with a infection of a UTI. Patient with multiple falls. PT/OT.  #8 hypertension Continue hydralazine, Coreg, Imdur.  #9 failure to thrive Patient with poor oral intake. Patient with advanced heart failure with a EF of 15-20% and diffuse hypokinesis. Patient with a history of COPD. Patient with worsening renal function. 5 if care consultation for goals of care.  #10 hypothyroidism Continue Synthroid.   #11 falls Radiographs/films negative for any acute fractures. PT/OT.   DVT prophylaxis: Coumadin Code Status: DO NOT RESUSCITATE Family Communication: Updated wife and daughter at bedside. Disposition Plan: Pending palliative care and cardiology evaluation. Likely residential hospice home.   Consultants:   Cardiology/heart failure team pending  Palliative care pending  Procedures:   Chest x-ray 08/24/2017  Plain films of the L-spine 08/24/2017  CT head 08/24/2017  Antimicrobials:   IV Rocephin 08/24/2017>>>  08/26/2017  Oral Ceftin 08/27/2017   Subjective: Patient states improvement with shortness of  breath since admission. No chest pain. Patient with poor oral intake per family.  Objective: Vitals:   08/26/17 0508 08/26/17 0510 08/26/17 0909 08/26/17 0952  BP: (!) 148/103 (!) 153/96 (!) 143/97   Pulse: 68 70    Resp: 16     Temp: 97.8 F (36.6 C)     TempSrc: Oral     SpO2: 100% 94%  96%  Weight: 86.6 kg (191 lb)     Height:        Intake/Output Summary (Last 24 hours) at 08/26/17 1318 Last data filed at 08/26/17 0240  Gross per 24 hour  Intake                0 ml  Output              300 ml  Net             -300 ml   Filed Weights   08/24/17 2131 08/25/17 0500 08/26/17 0508  Weight: 85.3 kg (188 lb) 85.5 kg (188 lb 6.4 oz) 86.6 kg (191 lb)    Examination:  General exam: Frail. NAD. Respiratory system: Clear to auscultation Anterior lung fields.[name] Respiratory effort normal. Cardiovascular system: Regular rate and rhythm. Positive JVD. No murmurs, rubs, gallops or clicks. Trace to 1+ bilateral lower extremity edema . Gastrointestinal system: Abdomen is nondistended, soft and nontender. No organomegaly or masses felt. Normal bowel sounds heard. Central nervous system: Alert and oriented. No focal neurological deficits. Extremities: Symmetric 5 x 5 power. Skin: No rashes, lesions or ulcers Psychiatry: Judgement and insight appear fair. Mood & affect appropriate.     Data Reviewed: I have personally reviewed following labs and imaging studies  CBC:  Recent Labs Lab 08/24/17 1351 08/25/17 0553 08/26/17 0234  WBC 7.7 10.0 10.4  NEUTROABS 6.5  --   --   HGB 11.8* 12.5* 11.5*  HCT 38.8* 40.9 38.0*  MCV 89.8 89.9 89.8  PLT 186 131* 139*   Basic Metabolic Panel:  Recent Labs Lab 08/24/17 1351 08/25/17 0553 08/25/17 1027 08/26/17 0234  NA 150* 150*  --  149*  K 4.2 4.2  --  4.2  CL 116* 117*  --  115*  CO2 20* 21*  --  22  GLUCOSE 104* 109*  --  133*  BUN  42* 39*  --  40*  CREATININE 3.54* 3.32*  --  3.42*  CALCIUM 9.0 8.9  --  9.1  MG  --   --  2.0  --   PHOS  --   --  2.7  --    GFR: Estimated Creatinine Clearance: 20.7 mL/min (A) (by C-G formula based on SCr of 3.42 mg/dL (H)). Liver Function Tests:  Recent Labs Lab 08/24/17 1351  AST 21  ALT 25  ALKPHOS 89  BILITOT 1.4*  PROT 5.8*  ALBUMIN 3.3*   No results for input(s): LIPASE, AMYLASE in the last 168 hours. No results for input(s): AMMONIA in the last 168 hours. Coagulation Profile:  Recent Labs Lab 08/24/17 1351 08/25/17 1027 08/26/17 0234  INR 2.45 3.83 4.26*   Cardiac Enzymes:  Recent Labs Lab 08/24/17 1839 08/25/17 1924 08/26/17 0234  TROPONINI 0.12* 0.22* 0.22*   BNP (last 3 results) No results for input(s): PROBNP in the last 8760 hours. HbA1C: No results for input(s): HGBA1C in the last 72 hours. CBG:  Recent Labs Lab 08/24/17 2145  GLUCAP 108*   Lipid Profile: No results for input(s): CHOL, HDL, LDLCALC, TRIG, CHOLHDL, LDLDIRECT in  the last 72 hours. Thyroid Function Tests: No results for input(s): TSH, T4TOTAL, FREET4, T3FREE, THYROIDAB in the last 72 hours. Anemia Panel: No results for input(s): VITAMINB12, FOLATE, FERRITIN, TIBC, IRON, RETICCTPCT in the last 72 hours. Sepsis Labs: No results for input(s): PROCALCITON, LATICACIDVEN in the last 168 hours.  Recent Results (from the past 240 hour(s))  Culture, Urine     Status: None (Preliminary result)   Collection Time: 08/24/17  4:08 PM  Result Value Ref Range Status   Specimen Description URINE, RANDOM  Final   Special Requests NONE  Final   Culture CULTURE REINCUBATED FOR BETTER GROWTH  Final   Report Status PENDING  Incomplete         Radiology Studies: Dg Chest 2 View  Result Date: 08/24/2017 CLINICAL DATA:  Falling.  Weakness EXAM: CHEST  2 VIEW COMPARISON:  04/07/2016 FINDINGS: Cardiac enlargement. AICD in good position. Congestive heart failure with pulmonary edema has  increased in the interval. Small bilateral effusions and mild bibasilar atelectasis IMPRESSION: Congestive heart failure with pulmonary edema and small bilateral effusions. Electronically Signed   By: Marlan Palau M.D.   On: 08/24/2017 13:27   Dg Lumbar Spine 2-3 Views  Result Date: 08/24/2017 CLINICAL DATA:  Lumbosacral back pain. Patient fell today. Trying to sit in chair, chair moved and he fell straight down on his sacrum/coccyx. Fell onto concrete. EXAM: LUMBAR SPINE - 2-3 VIEW COMPARISON:  None. FINDINGS: Straightening of normal lordosis. No traumatic subluxation. Advanced disc space narrowing and endplate spurring at L3-L4 and L4-L5. Multilevel facet arthropathy. Vertebral body heights are maintained. No compression fracture. Posterior elements appear intact. The sacroiliac joints are congruent. Abdominal aortic atherosclerosis is incidentally noted. IMPRESSION: Multilevel degenerative change throughout the lumbar spine, no acute fracture. Electronically Signed   By: Rubye Oaks M.D.   On: 08/24/2017 19:42   Ct Head Wo Contrast  Result Date: 08/24/2017 CLINICAL DATA:  Fall earlier today. EXAM: CT HEAD WITHOUT CONTRAST TECHNIQUE: Contiguous axial images were obtained from the base of the skull through the vertex without intravenous contrast. COMPARISON:  March 04, 2016 FINDINGS: Brain: There is mild to moderate diffuse atrophy. There is no intracranial mass, hemorrhage, extra-axial fluid collection, or midline shift. There is a prior lacunar type infarct in the posterior limb of the right internal capsule and lateral right thalamus, stable. There is patchy small vessel disease throughout the centra semiovale bilaterally. There is no new gray-white compartment lesion. No evident acute infarct. Vascular: There is no hyperdense vessel. There is calcification in each carotid siphon region. There is also calcification in the distal vertebral arteries, more severe on the left than on the right. Skull:  Bony calvarium appears intact. Sinuses/Orbits: There is mucosal thickening in several ethmoid air cells bilaterally. Other visualized paranasal sinuses are clear. Orbits appear symmetric bilaterally. Other: Mastoid air cells are clear. IMPRESSION: Atrophy with periventricular small vessel disease, stable. Prior lacunar infarct in the posterior limb of the right internal capsule and lateral right thalamus, stable. No acute infarct evident. No intracranial mass hemorrhage, or extra-axial fluid collection. Foci of arterial vascular calcification noted. There is ethmoid sinus disease. Electronically Signed   By: Bretta Bang III M.D.   On: 08/24/2017 15:47        Scheduled Meds: . amiodarone  200 mg Oral Daily  . buPROPion  150 mg Oral Daily  . carvedilol  3.125 mg Oral BID WC  . feeding supplement (ENSURE ENLIVE)  237 mL Oral BID BM  . hydrALAZINE  25 mg Oral BID  . HYDROcodone-acetaminophen  2 tablet Oral Once  . isosorbide mononitrate  30 mg Oral Daily  . levothyroxine  100 mcg Oral QAC breakfast  . mouth rinse  15 mL Mouth Rinse BID  . mometasone-formoterol  2 puff Inhalation BID  . nystatin  5 mL Oral QID  . potassium chloride SA  20 mEq Oral Daily  . torsemide  20 mg Oral Daily  . Warfarin - Pharmacist Dosing Inpatient   Does not apply q1800   Continuous Infusions: . cefTRIAXone (ROCEPHIN)  IV Stopped (08/25/17 1731)  . dextrose 50 mL/hr at 08/26/17 1118     LOS: 1 day    Time spent: 35 minutes    THOMPSON,DANIEL, MD [facility] Pager [phone]  If 7PM-7AM, please contact night-coverage [email or web address] Password [email or web address] 08/26/2017, 1:18 PM

## 2017-08-26 NOTE — Progress Notes (Signed)
PT Cancellation Note  Patient Details Name: Frank Mclean MRN: [id number] DOB: 1941-04-27   Cancelled Treatment:    Reason Eval/Treat Not Completed: (P) Fatigue/lethargy limiting ability to participate (Pt refusing tx this pm, family reports patient is going to d/c to hospice.  No note found in chart to support this.  Will defer tx today.  )   Deoni Cosey J Aundria Rud 08/26/2017, 5:09 PM  Joycelyn Rua, PTA pager [phone]

## 2017-08-26 NOTE — Consult Note (Signed)
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  Clinical Assessment and Goals of Care: Frank Mclean is resting in bed in no distress. His wife asked to speak outside of patient's room initially. She states he has had  progressive generalized weakness and fatigue. She states he has had multiple falls from losing balance and she has had to have assistance getting him up. She states he has hardly been eating or drinking anything. She states several of the doctors have spoke with her about prognosis, and she would like to speak with cardiology. She further states that she can not take care of him at home and requests Hospice Care.    Dr. Gala Romney with cardiology entered into meeting to join goals of care converstion. She states her health is not optimal and she cannot manage his care herself. After discussing predicted trajectory and concerns, decision made for hospice facility, preferably [facility]. Cardiology deactivating ICD. He does not want dialysis or feeding tube.     Patient is Management consultant. His wife is present. He has several children.   Advance Directive in EMR.     SUMMARY OF RECOMMENDATIONS   Hospice Facility, preferably [facility].    Code Status/Advance Care Planning:  DNR   Additional Recommendations (Limitations, Scope, Preferences):  Comfort care  Prognosis:   < 2 weeks Very poor appetite, creatinine of 3.4 and patient does not want dialysis, INR  is 4.26. Failure to thrive, very poor prognosis.       Discharge Planning: Hospice facility      Primary Diagnoses: Present on Admission: . AKI (acute kidney injury) (HCC) . Chronic atrial fibrillation (HCC) . Chronic depression . COPD with chronic bronchitis (HCC) . DM (diabetes mellitus), type 2 with renal complications (HCC) . Hypothyroidism . Acute on chronic combined systolic and diastolic CHF (congestive heart failure) (HCC)   I have reviewed the medical record, interviewed the patient and family, and examined the patient. The following aspects are pertinent.  Past Medical History:  Diagnosis Date  . Acute cystitis without hematuria   . AICD (automatic cardioverter/defibrillator) present   . Anemia in chronic renal disease   . Anemia of chronic disease   . Anticoagulated on Coumadin   . Bilateral leg ulcer (HCC)    ACHILLES AREA--  NONHEALING  . BPH (benign prostatic hypertrophy)   . CAD (coronary artery disease)    a. LHC 4/12: Mid LAD 25%, mid diagonal 30%, AV circumflex 40%, proximal OM 25%, distal RCA 40%  . Chronic combined systolic and diastolic CHF (congestive heart failure) (HCC)   . Chronic combined systolic and diastolic heart failure (HCC)  a. Echo 05/2013: EF 25-30%, diffuse HK, restrictive physiology, trivial AI, mild MR, moderate LAE, reduced RV systolic function, PASP 42  . Chronic depression   . CKD (chronic kidney disease) stage 3, GFR 30-59 ml/min   . 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 DJD (degenerative joint disease)   . DM (diabetes mellitus), type 2 with renal complications (HCC)   . Elevated troponin   . GERD (gastroesophageal reflux disease)   . Gout   . Gout   . HTN (hypertension)   . Hx of cardiovascular stress test    Nuclear Stress Test (1/16): High risk stress nuclear study with a large, severe, partially reversible inferior and apical  defect consistent with prior inferior and apical infarct; mild apical ischemia; severe LVE; study high risk due to reduced LV function.  EF 25% >> reviewed with Dr. Antoine Poche >> medical mgmt  . Hyperbilirubinemia   . Hyperkalemia   . Hyperlipidemia   . Hypernatremia   . Hypokalemia   . Hypokalemia   . Hypothyroidism   . LBBB (left bundle branch block)   . Loose stools   . Memory loss   . NICM (nonischemic cardiomyopathy) (HCC)    a. EF 25-30%.  . On home oxygen therapy    "2L at night" (04/06/2016)  . Osteoarthritis   . PAF (paroxysmal atrial fibrillation) (HCC)    a. amiodarone Rx started 05/2013;  b. chronic coumadin  . Peripheral vascular disease (HCC)   . Physical deconditioning   . Pneumonia   . Presence of permanent cardiac pacemaker   . Shortness of breath dyspnea    Social History   Social History  . Marital status: Married    Spouse name: N/A  . Number of children: N/A  . Years of education: N/A   Occupational History  . Retired - Working in Product manager Retired   Social History Main Topics  . Smoking status: Former Smoker    Quit date: 04/12/1974  . Smokeless tobacco: Never Used  . Alcohol use No     Comment: previously drank - "love cognac" quit 15 yrs ago.  . Drug use: No  . Sexual activity: No   Other Topics Concern  . None   Social History Narrative   Worked at [employer] year.  He is married and lives with his wife [name] in [place].  Has one daughter Lowella Bandy   Family History  Problem Relation Age of Onset  . Hypertension Mother   . Heart disease Mother   . Diabetes Mother   . Hypertension Father   . Heart disease Father   . Heart attack Maternal Grandmother   . Gout Other   . Stroke Other   . Thyroid disease Neg Hx    Scheduled Meds: . amiodarone  200 mg Oral Daily  . buPROPion  150 mg Oral Daily  . carvedilol  3.125 mg Oral BID WC  . feeding supplement (ENSURE ENLIVE)  237 mL Oral BID BM  . furosemide  80 mg Intravenous BID  .  hydrALAZINE  25 mg Oral BID  . HYDROcodone-acetaminophen  2 tablet Oral Once  . isosorbide mononitrate  30 mg Oral Daily  . levothyroxine  100 mcg Oral QAC breakfast  . mouth rinse  15 mL Mouth Rinse BID  . mometasone-formoterol  2 puff Inhalation BID  . nystatin  5 mL Oral QID  .  potassium chloride SA  20 mEq Oral Daily  . Warfarin - Pharmacist Dosing Inpatient   Does not apply q1800   Continuous Infusions: . cefTRIAXone (ROCEPHIN)  IV Stopped (08/25/17 1731)  . dextrose 50 mL/hr at 08/26/17 1118   PRN Meds:.HYDROcodone-acetaminophen, ondansetron **OR** ondansetron (ZOFRAN) IV Medications Prior to Admission:  Prior to Admission medications   Medication Sig Start Date End Date Taking? Authorizing Provider  acetaminophen (TYLENOL) 325 MG tablet Take 1 tablet (325 mg total) by mouth every 6 (six) hours as needed for mild pain (or Fever >/= 101). 03/07/16  Yes Elgergawy, Leana Roe, MD  amiodarone (PACERONE) 200 MG tablet Take 1 tablet (200 mg total) by mouth daily. Take 200 mg amiodarone (one tablet) by mouth 6 days a week.  Do not take on Sunday. 08/12/17  Yes Marinus Maw, MD  budesonide-formoterol (SYMBICORT) 160-4.5 MCG/ACT inhaler INHALE 2 PUFFS INTO THE LUNGS 2 TIMES DAILY 12/24/16  Yes Plotnikov, Georgina Quint, MD  buPROPion (WELLBUTRIN XL) 150 MG 24 hr tablet Take 1 tablet (150 mg total) by mouth daily. 08/12/17  Yes Plotnikov, Georgina Quint, MD  carvedilol (COREG) 6.25 MG tablet Take 0.5 tablets (3.125 mg total) by mouth 2 (two) times daily. 08/12/17  Yes Marinus Maw, MD  [employer] VITAMIN B12 1000 MCG tablet TAKE 1 TABLET BY MOUTH DAILY 12/19/16  Yes Plotnikov, Georgina Quint, MD  [employer] VITAMIN D3 1000 units capsule TAKE 1 TABLET (1,000 UNITS TOTAL) BY MOUTH DAILY. 02/17/17  Yes Plotnikov, Georgina Quint, MD  ferrous sulfate 325 (65 FE) MG tablet TAKE 1 TABLET (325 MG TOTAL) BY MOUTH DAILY. 02/17/17  Yes Plotnikov, Georgina Quint, MD  hydrALAZINE (APRESOLINE) 25 MG tablet Take 1 tablet (25 mg total) by mouth 2 (two)  times daily. 08/12/17  Yes Plotnikov, Georgina Quint, MD  isosorbide mononitrate (IMDUR) 30 MG 24 hr tablet Take 1 tablet (30 mg total) by mouth daily. 02/04/17  Yes Laurey Morale, MD  levothyroxine (SYNTHROID, LEVOTHROID) 100 MCG tablet TAKE 2 TABLETS BY MOUTH EVERY MORNING BEFORE BREAKFAST Patient taking differently: TAKE 200 mg TABLETS BY MOUTH EVERY MORNING BEFORE BREAKFAST 04/18/17  Yes Plotnikov, Georgina Quint, MD  Menthol, Topical Analgesic, (BIOFREEZE) 4 % GEL Apply topically as needed. Apply to left shoulder and left ankle    Yes [provider]  nystatin (MYCOSTATIN) 100000 UNIT/ML suspension Take 5 mLs by mouth 4 (four) times daily. 08/12/17  Yes [provider]  potassium chloride SA (K-DUR,KLOR-CON) 20 MEQ tablet Take 1 tablet (20 mEq total) by mouth 2 (two) times daily. Patient taking differently: Take 20 mEq by mouth daily.  06/20/17  Yes Plotnikov, Georgina Quint, MD  torsemide (DEMADEX) 20 MG tablet Take 1 tablet (20 mg total) by mouth daily. Take one at lunch 08/12/17  Yes Plotnikov, Georgina Quint, MD  triamcinolone cream (KENALOG) 0.5 % Apply 1 application topically 2 (two) times daily as needed (itching).   Yes [provider]  warfarin (COUMADIN) 5 MG tablet TAKE 1/2 OR 1 TABLET BY MOUTH DAILY OR AS DIRECTED BY COUMADIN CLINIC. Patient taking differently: TAKE 2.5 mg every day except thursday. on Thursday he takes 5 mg 08/12/17  Yes Laurey Morale, MD   Allergies  Allergen Reactions  . Levothyroxine Sodium Other (See Comments)    MUST TAKE BRAND NAME GENERIC DOESN'T WORK FOR PATIENT   Review of Systems  Constitutional: Positive for activity change, appetite change and fatigue.    Physical Exam  Constitutional: No distress.  HENT:  Head: Normocephalic  and atraumatic.  Eyes: EOM are normal.  Cardiovascular:  Warm and dry  Pulmonary/Chest: Effort normal. No respiratory distress.  Abdominal: There is no tenderness.  Neurological: He is alert.    Vital Signs:  BP (!) 124/101 (BP Location: Right Arm)   Pulse 69   Temp 98 F (36.7 C) (Oral)   Resp 18   Ht 6\' 2"  (1.88 m)   Wt 86.6 kg (191 lb)   SpO2 100%   BMI 24.52 kg/m  Pain Assessment: No/denies pain   Pain Score: Asleep   SpO2: SpO2: 100 % O2 Device:SpO2: 100 % O2 Flow Rate: .O2 Flow Rate (L/min): 1 L/min  IO: Intake/output summary:  Intake/Output Summary (Last 24 hours) at 08/26/17 1643 Last data filed at 08/26/17 1641  Gross per 24 hour  Intake              240 ml  Output              500 ml  Net             -260 ml    LBM: Last BM Date: 08/24/17 Baseline Weight: Weight: 85.3 kg (188 lb) Most recent weight: Weight: 86.6 kg (191 lb)     Palliative Assessment/Data: 20%     Time In: 15:30 Time Out: 16:30 Time Total: 1 hour Greater than 50%  of this time was spent counseling and coordinating care related to the above assessment and plan.  Signed by: Morton Stall, NP   Please contact Palliative Medicine Team phone at [phone] for questions and concerns.  For individual provider: See Loretha Stapler

## 2017-08-26 NOTE — Progress Notes (Signed)
PHARMACY NOTE -  ANTIBIOTIC RENAL DOSE ADJUSTMENT   Request received for Pharmacy to assist with antibiotic renal dose adjustment.  Patient has been initiated on ceftin for uti. SCr 3.4, estimated CrCl 20 ml/min. Will adjust dose to daily. Will sign off at this time. Please reconsult if a change in clinical status warrants re-evaluation of dosage.  Sheppard Coil PharmD., BCPS Clinical Pharmacist Pager [phone] 08/26/2017 10:14 PM

## 2017-08-27 DIAGNOSIS — Z7189 Other specified counseling: Secondary | ICD-10-CM

## 2017-08-27 LAB — BASIC METABOLIC PANEL
Anion gap: 12 (ref 5–15)
BUN: 40 mg/dL — ABNORMAL HIGH (ref 6–20)
CALCIUM: 8.9 mg/dL (ref 8.9–10.3)
CO2: 22 mmol/L (ref 22–32)
CREATININE: 3.3 mg/dL — AB (ref 0.61–1.24)
Chloride: 112 mmol/L — ABNORMAL HIGH (ref 101–111)
GFR calc non Af Amer: 17 mL/min — ABNORMAL LOW (ref >60)
GFR, EST AFRICAN AMERICAN: 19 mL/min — AB (ref >60)
Glucose, Bld: 142 mg/dL — ABNORMAL HIGH (ref 65–99)
Potassium: 3.8 mmol/L (ref 3.5–5.1)
SODIUM: 146 mmol/L — AB (ref 135–145)

## 2017-08-27 LAB — PROTIME-INR
INR: 4.93
PROTHROMBIN TIME: 45.6 s — AB (ref 11.4–15.2)

## 2017-08-27 MED ORDER — HALOPERIDOL 0.5 MG PO TABS
0.5000 mg | ORAL_TABLET | ORAL | Status: DC | PRN
  Filled 2017-08-27: qty 1

## 2017-08-27 MED ORDER — ACETAMINOPHEN 650 MG RE SUPP: 650.0000 mg | Freq: Four times a day (QID) | RECTAL | Status: DC | PRN

## 2017-08-27 MED ORDER — BIOTENE DRY MOUTH MT LIQD: 15.0000 mL | OROMUCOSAL | Status: DC | PRN

## 2017-08-27 MED ORDER — GLYCOPYRROLATE 1 MG PO TABS: 1.0000 mg | ORAL_TABLET | ORAL | Status: DC | PRN

## 2017-08-27 MED ORDER — HALOPERIDOL LACTATE 5 MG/ML IJ SOLN: 0.5000 mg | INTRAMUSCULAR | Status: DC | PRN

## 2017-08-27 MED ORDER — ONDANSETRON 4 MG PO TBDP: 4.0000 mg | ORAL_TABLET | Freq: Four times a day (QID) | ORAL | Status: DC | PRN

## 2017-08-27 MED ORDER — HALOPERIDOL LACTATE 2 MG/ML PO CONC: 0.6000 mg | ORAL | 0 refills | Status: AC | PRN

## 2017-08-27 MED ORDER — HALOPERIDOL LACTATE 2 MG/ML PO CONC
0.5000 mg | ORAL | Status: DC | PRN
  Filled 2017-08-27: qty 0.3

## 2017-08-27 MED ORDER — POLYVINYL ALCOHOL 1.4 % OP SOLN: 1.0000 [drp] | Freq: Four times a day (QID) | OPHTHALMIC | Status: DC | PRN

## 2017-08-27 MED ORDER — CEFUROXIME AXETIL 250 MG PO TABS: 250.0000 mg | ORAL_TABLET | Freq: Every day | ORAL | 0 refills | Status: AC

## 2017-08-27 MED ORDER — GLYCOPYRROLATE 1 MG PO TABS: 1.0000 mg | ORAL_TABLET | ORAL | 0 refills | Status: AC | PRN

## 2017-08-27 MED ORDER — LORAZEPAM 2 MG/ML IJ SOLN: 1.0000 mg | INTRAMUSCULAR | Status: DC | PRN

## 2017-08-27 MED ORDER — ONDANSETRON HCL 4 MG/2ML IJ SOLN: 4.0000 mg | Freq: Four times a day (QID) | INTRAMUSCULAR | Status: DC | PRN

## 2017-08-27 MED ORDER — ACETAMINOPHEN 325 MG PO TABS
650.0000 mg | ORAL_TABLET | Freq: Four times a day (QID) | ORAL | Status: DC | PRN
Start: 2017-08-27 — End: 2017-08-27

## 2017-08-27 MED ORDER — HYDROCODONE-ACETAMINOPHEN 5-325 MG PO TABS: 1.0000 | ORAL_TABLET | Freq: Four times a day (QID) | ORAL | 0 refills | Status: AC | PRN

## 2017-08-27 MED ORDER — GLYCOPYRROLATE 0.2 MG/ML IJ SOLN: 0.2000 mg | INTRAMUSCULAR | Status: DC | PRN

## 2017-08-27 MED ORDER — LORAZEPAM 2 MG/ML PO CONC: 1.0000 mg | ORAL | Status: DC | PRN

## 2017-08-27 MED ORDER — LORAZEPAM 1 MG PO TABS: 1.0000 mg | ORAL_TABLET | ORAL | Status: DC | PRN

## 2017-08-27 MED ORDER — LORAZEPAM 2 MG/ML PO CONC: 1.0000 mg | ORAL | 0 refills | Status: AC | PRN

## 2017-08-27 MED ORDER — ONDANSETRON 4 MG PO TBDP: 4.0000 mg | ORAL_TABLET | Freq: Four times a day (QID) | ORAL | 0 refills | Status: AC | PRN

## 2017-08-27 NOTE — Progress Notes (Signed)
CRITICAL VALUE ALERT  Critical Value:  INR 4.93   Date & Time Notied:  08-27-17 06:55  Provider Notified: X. Blount  Orders Received/Actions taken: No new orders at this time.
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
## 2017-08-27 NOTE — Clinical Social Work Note (Signed)
CSW notified by Pall. Care [phone] ([place]) pt/family have decided on comfort care and would like to go with residential hospice, [facility]. CSW called in referral to [facility] who will meet with family and check bed availability at facility.   CSW will continue to follow for DC planning.  Mekaila Tarnow B. Gean Quint Clinical Social Work Dept Weekend Social Worker [phone] 11:15 AM

## 2017-08-27 NOTE — Progress Notes (Signed)
ANTICOAGULATION CONSULT NOTE - Follow Up Consult  Pharmacy Consult for Coumadin Indication: atrial fibrillation   Patient Measurements: Height: 6\' 2"  (188 cm) Weight: 191 lb (86.6 kg) IBW/kg (Calculated) : 82.2  Vital Signs: Temp: 97.6 F (36.4 C) (09/08 0509) Temp Source: Oral (09/08 0509) BP: 140/90 (09/08 0509) Pulse Rate: 73 (09/08 0509)  Labs:  Recent Labs  08/24/17 1351 08/24/17 1839 08/25/17 0553 08/25/17 1027 08/25/17 1924 08/26/17 0234 08/27/17 0502  HGB 11.8*  --  12.5*  --   --  11.5*  --   HCT 38.8*  --  40.9  --   --  38.0*  --   PLT 186  --  131*  --   --  139*  --   LABPROT 26.4*  --   --  39.0*  --  40.6* 45.6*  INR 2.45  --   --  3.83  --  4.26* 4.93*  CREATININE 3.54*  --  3.32*  --   --  3.42* 3.30*  TROPONINI  --  0.12*  --   --  0.22* 0.22*  --     Estimated Creatinine Clearance: 21.4 mL/min (A) (by C-G formula based on SCr of 3.3 mg/dL (H)).   Assessment: 76 y.o. male on warfarin prior to admission for history of afib. INR was therapeutic on admission, but is now above goal and trending up.  No bleeding noted.  No new drug interactions noted (was on Amio PTA).  Goal of Therapy:  INR 2-3 Monitor platelets by anticoagulation protocol: Yes   Plan: Continue to hold warfarin tonight with elevated INR Continue daily INR  Blake Divine, Pharm.D. PGY1 Pharmacy Resident 08/27/2017 8:04 AM Main Pharmacy: [phone]

## 2017-08-27 NOTE — Progress Notes (Addendum)
1300---[facility] RN Visit  Received request from [place], [place] for family interest in [name] with request to transfer today. Chart reviewed and report received from [employer], PMT. Met with patient, wife and brother-in-law to confirm interest and explain services. Family agreeable to transfer today. Wilburn Cornelia, [place] aware.   Registration paperwork completed. Dr. Orpah Melter to assume care per family request.   Discharge summary has been faxed to [phone]. RN please call report to [phone].  [facility] has been called for and at this time we are 11th in line for transport.  Thank you, Margaretmary Eddy, RN, [facility] Liaison [phone]

## 2017-08-27 NOTE — Clinical Social Work Note (Signed)
Clinical Social Worker facilitated patient discharge including contacting patient family and facility to confirm patient discharge plans. Clinical information faxed to facility and family agreeable with plan. CSW arranged ambulance transport via [facility] to [facility]. RN to call report prior to discharge.  Clinical Social Worker will sign off for now as social work intervention is no longer needed. Please consult [place] again if new need arises.  Sanjana Folz B. Janell Quiet, LCSWA Clinical Social Work Dept Weekend Social Worker [phone] 1:10 PM

## 2017-08-27 NOTE — Care Management Note (Signed)
Case Management Note  Patient Details  Name: Frank Mclean MRN: [id number] Date of Birth: [birth year]  Subjective/Objective:                 Patient with order to discharge to [facility] [place]. Discharge facilitated through Clinical Social Work (CSW). Please refer to CSW notes for disposition plan and direct questions CSW on call accordingly. Lawerance Sabal RN [employer] [phone].    Action/Plan:   Expected Discharge Date:  08/27/17               Expected Discharge Plan:  Hospice Medical Facility  In-House Referral:  Clinical Social Work  Discharge planning Services  CM Consult  Post Acute Care Choice:    Choice offered to:  Spouse  DME Arranged:    DME Agency:     HH Arranged:    HH Agency:  [facility]  Status of Service:  Completed, signed off  If discussed at Long Length of Stay Meetings, dates discussed:    Additional Comments:  Lawerance Sabal, RN 08/27/2017, 1:04 PM

## 2017-08-27 NOTE — Discharge Summary (Signed)
Physician Discharge Summary  Frank Mclean NMV:[id number] DOB: 1941/02/05 DOA: 08/24/2017  PCP: Tresa Garter, MD  Admit date: 08/24/2017 Discharge date: 08/27/2017  Time spent: 65 minutes  Recommendations for Outpatient Follow-up:  1. Discharged to [facility] place. Follow-up with M.D. At [facility] place.   Discharge Diagnoses:  Active Problems:   DM (diabetes mellitus), type 2 with renal complications (HCC)   COPD with chronic bronchitis (HCC)   Anticoagulated on Coumadin   Chronic depression   Hypothyroidism   Acute on chronic combined systolic and diastolic CHF (congestive heart failure) (HCC)   Chronic atrial fibrillation (HCC)   AKI (acute kidney injury) (HCC)   Physical deconditioning   Acute lower UTI   Goals of care, counseling/discussion   Discharge Condition: stable.  Diet recommendation: regular  Filed Weights   08/25/17 0500 08/26/17 0508 08/27/17 0509  Weight: 85.5 kg (188 lb 6.4 oz) 86.6 kg (191 lb) 86.6 kg (191 lb)    History of present illness:  Per Dr. Chauncy Lean is a 76 y.o. male with medical history significant of anemia, BPH, CHF, CAD, CVA, depression, DJD, diabetes, gout, hypertension, hyperlipidemia, PAF/an ICM/LBBB/AICD placement, PVD,.  Patient presented with 3-4 week history of progressive generalized weakness and fatigue with significant worsening over the last 1-2 days. Patient's wife stated that he's had very foul-smelling urine over the last day but denied any dysuria, frequency, flank pain, fevers, abdominal pain. Additionally there were no complaints of chest pain, palpitations, cough, neck stiffness, headache, focal neurological deficit. Patient has had multiple falls but no LOC. Falls are typically from patient losing his balance are becoming weak. Patient also has a had a decrease in his appetite and eats and drinks very little.  ED Course: Objective findings outlined below. 500 mL bolus administered.  Hospital Course:  #1  urinary tract infection Urinalysis on admission was concerning for UTI. Patient was placed on IV Rocephin. Urine cultures with no growth to date. Patient was subsequently transitioned to oral Ceftin to complete a course of antibiotic treatment.  #2 acute on chronic combined systolic and diastolic heart failure Patient admitted with acute on chronic systolic and diastolic heart failure. Patient initially placed on IV Lasix and subsequently transitioned to oral Demadex. Patient with poor urine output. Per last 2-D echo from 03/03/2017 patient with a EF of 15-20%, diffuse hypokinesis, grade 3 diastolic dysfunction and being followed by the heart failure team. Patient has been deteriorating recently. Patient was seen by the heart failure team. Also patient was at end-stage heart failure and progressive renal disease. Patient was seen by the heart failure team and palliative care and go was full comfort measures. It was noted that patient wasn't end-stage heart failure as well as progressive renal failure. Patient be discharged to  Residential hospice home at [facility] place.  #3 COPD Stable. Continued on Dulera  #4 chronic atrial fibrillation Patient was maintained on home regimen of Coreg as well as amiodarone for rate control. INR was supratherapeutic and as such Coumadin was held. Patient will be discharged to residential hospice home off his Coumadin.  #5 depression Continued on home regimen Wellbutrin.  #6 nonischemic cardiomyopathy/status post AICD Patient with EF of 15-20% with diffuse hypokinesis. 2-D echo from 03/03/2017. Patient has been deteriorating recently. Patient likely with end-stage CHF. Continued on hydralazine,imdur, amiodarone, Coreg, diuretics. Patient was seen in consultation by cardiology/heart failure team was felt patient continued to deteriorate in the setting of end-stage heart failure progressive renal failure. Patient also noted  to have a history of dementia weakness and  failure to thrive. Heart failure team discussed with patient's family and was recommended that patient pursue comfort measures. Palliative care consultation obtained and patient made full comfort measures. Patient will be discharged to a residential hospice home at [facility] place.  #7 debility/physical deconditioning Patient deteriorating per family. Patient with a poor appetite and failure to thrive symptoms. Patient with advanced heart failure likely now with end-stage heart failure. Patient with history of renal disease. Patient was placed empirically on antibiotics for UTI. Patient was transitioned to oral Ceftin on be discharged on 3 more days of oral Ceftin to complete a course of antibiotic treatment. Patient be discharged to residential hospice home.  #8 hypertension Patient was maintained on home regimen of hydralazine, Coreg, Imdur.  #9 failure to thrive Patient with poor oral intake. Patient with advanced heart failure with a EF of 15-20% and diffuse hypokinesis. Patient with a history of COPD. Patient with worsening renal function. Palliative care consultation was obtained and focus was shifted to full comfort measures. Patient was also seen by cardiologist and it was felt patient was in end-stage heart failure and comfort measures were recommended. Patient will be discharged to residential hospice home at [facility].   #10 hypothyroidism Patient maintained on home regimen of Synthroid.  #11 falls Radiographs/films negative for any acute fractures.  #12 Prognosis Patient noted to have a poor prognosis as patient with end-stage heart failure with progressive renal failure, dementia, failure to thrive. Patient's cardiologist assessed the patient and spoke with the family at length in regards to patient's prognosis It was felt that patient was nearing the end of his life and residential hospice was recommended. Palliative care also met the patient and family and goal was comfort  measures.Patient's ICD was subsequently be activated. Patient will be discharged to residential hospice home at [facility].    Procedures:  Chest x-ray 08/24/2017  Plain films of the L-spine 08/24/2017  CT head 08/24/2017  Consultations:  Cardiology/heart failure team: Dr Haroldine Laws 08/26/2017  Palliative care : Asencion Gowda 08/26/2017  Discharge Exam: Vitals:   08/26/17 2246 08/27/17 0509  BP: 136/88 140/90  Pulse: 69 73  Resp: 19   Temp: 98 F (36.7 C) 97.6 F (36.4 C)  SpO2: 99% 96%    General: NAD Cardiovascular: RRR, trace BLE edema Respiratory: CTAB anterior lung fields.  Discharge Instructions   Discharge Instructions    Diet general    Complete by:  As directed    Increase activity slowly    Complete by:  As directed      Current Discharge Medication List    START taking these medications   Details  cefUROXime (CEFTIN) 250 MG tablet Take 1 tablet (250 mg total) by mouth daily with breakfast. Take for 3 days then stop. Qty: 3 tablet, Refills: 0    glycopyrrolate (ROBINUL) 1 MG tablet Take 1 tablet (1 mg total) by mouth every 4 (four) hours as needed (excessive secretions). Qty: 10 tablet, Refills: 0    haloperidol (HALDOL) 2 MG/ML solution Place 0.3 mLs (0.6 mg total) under the tongue every 4 (four) hours as needed for agitation (or delirium). Qty: 30 mL, Refills: 0    HYDROcodone-acetaminophen (NORCO/VICODIN) 5-325 MG tablet Take 1-2 tablets by mouth every 6 (six) hours as needed for moderate pain or severe pain. Qty: 15 tablet, Refills: 0    LORazepam (ATIVAN) 2 MG/ML concentrated solution Place 0.5 mLs (1 mg total) under the tongue every 4 (  four) hours as needed for anxiety. Qty: 30 mL, Refills: 0    ondansetron (ZOFRAN-ODT) 4 MG disintegrating tablet Take 1 tablet (4 mg total) by mouth every 6 (six) hours as needed for nausea. Qty: 20 tablet, Refills: 0      CONTINUE these medications which have NOT CHANGED   Details  acetaminophen  (TYLENOL) 325 MG tablet Take 1 tablet (325 mg total) by mouth every 6 (six) hours as needed for mild pain (or Fever >/= 101).    amiodarone (PACERONE) 200 MG tablet Take 1 tablet (200 mg total) by mouth daily. Take 200 mg amiodarone (one tablet) by mouth 6 days a week.  Do not take on Sunday. Qty: 90 tablet, Refills: 3    budesonide-formoterol (SYMBICORT) 160-4.5 MCG/ACT inhaler INHALE 2 PUFFS INTO THE LUNGS 2 TIMES DAILY Qty: 10.2 Inhaler, Refills: 3    buPROPion (WELLBUTRIN XL) 150 MG 24 hr tablet Take 1 tablet (150 mg total) by mouth daily. Qty: 30 tablet, Refills: 5    carvedilol (COREG) 6.25 MG tablet Take 0.5 tablets (3.125 mg total) by mouth 2 (two) times daily. Qty: 90 tablet, Refills: 3    ferrous sulfate 325 (65 FE) MG tablet TAKE 1 TABLET (325 MG TOTAL) BY MOUTH DAILY. Qty: 90 tablet, Refills: 1    hydrALAZINE (APRESOLINE) 25 MG tablet Take 1 tablet (25 mg total) by mouth 2 (two) times daily. Qty: 180 tablet, Refills: 3    isosorbide mononitrate (IMDUR) 30 MG 24 hr tablet Take 1 tablet (30 mg total) by mouth daily. Qty: 30 tablet, Refills: 6    levothyroxine (SYNTHROID, LEVOTHROID) 100 MCG tablet TAKE 2 TABLETS BY MOUTH EVERY MORNING BEFORE BREAKFAST Qty: 60 tablet, Refills: 5    Menthol, Topical Analgesic, (BIOFREEZE) 4 % GEL Apply topically as needed. Apply to left shoulder and left ankle     nystatin (MYCOSTATIN) 100000 UNIT/ML suspension Take 5 mLs by mouth 4 (four) times daily.    potassium chloride SA (K-DUR,KLOR-CON) 20 MEQ tablet Take 1 tablet (20 mEq total) by mouth 2 (two) times daily. Qty: 180 tablet, Refills: 3    torsemide (DEMADEX) 20 MG tablet Take 1 tablet (20 mg total) by mouth daily. Take one at lunch Qty: 90 tablet, Refills: 3    triamcinolone cream (KENALOG) 0.5 % Apply 1 application topically 2 (two) times daily as needed (itching).      STOP taking these medications     [employer] VITAMIN B12 1000 MCG tablet      [employer] VITAMIN D3 1000 units capsule       warfarin (COUMADIN) 5 MG tablet        Allergies  Allergen Reactions  . Levothyroxine Sodium Other (See Comments)    MUST TAKE BRAND NAME GENERIC DOESN'T WORK FOR PATIENT   Follow-up Information    MD AT [facility] Follow up.            The results of significant diagnostics from this hospitalization (including imaging, microbiology, ancillary and laboratory) are listed below for reference.    Significant Diagnostic Studies: Dg Chest 2 View  Result Date: 08/24/2017 CLINICAL DATA:  Falling.  Weakness EXAM: CHEST  2 VIEW COMPARISON:  04/07/2016 FINDINGS: Cardiac enlargement. AICD in good position. Congestive heart failure with pulmonary edema has increased in the interval. Small bilateral effusions and mild bibasilar atelectasis IMPRESSION: Congestive heart failure with pulmonary edema and small bilateral effusions. Electronically Signed   By: Franchot Gallo M.D.   On: 08/24/2017 13:27   Dg Lumbar  Spine 2-3 Views  Result Date: 08/24/2017 CLINICAL DATA:  Lumbosacral back pain. Patient fell today. Trying to sit in chair, chair moved and he fell straight down on his sacrum/coccyx. Fell onto concrete. EXAM: LUMBAR SPINE - 2-3 VIEW COMPARISON:  None. FINDINGS: Straightening of normal lordosis. No traumatic subluxation. Advanced disc space narrowing and endplate spurring at V5-I4 and L4-L5. Multilevel facet arthropathy. Vertebral body heights are maintained. No compression fracture. Posterior elements appear intact. The sacroiliac joints are congruent. Abdominal aortic atherosclerosis is incidentally noted. IMPRESSION: Multilevel degenerative change throughout the lumbar spine, no acute fracture. Electronically Signed   By: Jeb Levering M.D.   On: 08/24/2017 19:42   Ct Head Wo Contrast  Result Date: 08/24/2017 CLINICAL DATA:  Fall earlier today. EXAM: CT HEAD WITHOUT CONTRAST TECHNIQUE: Contiguous axial images were obtained from the base of the skull through the vertex without  intravenous contrast. COMPARISON:  March 04, 2016 FINDINGS: Brain: There is mild to moderate diffuse atrophy. There is no intracranial mass, hemorrhage, extra-axial fluid collection, or midline shift. There is a prior lacunar type infarct in the posterior limb of the right internal capsule and lateral right thalamus, stable. There is patchy small vessel disease throughout the centra semiovale bilaterally. There is no new gray-white compartment lesion. No evident acute infarct. Vascular: There is no hyperdense vessel. There is calcification in each carotid siphon region. There is also calcification in the distal vertebral arteries, more severe on the left than on the right. Skull: Bony calvarium appears intact. Sinuses/Orbits: There is mucosal thickening in several ethmoid air cells bilaterally. Other visualized paranasal sinuses are clear. Orbits appear symmetric bilaterally. Other: Mastoid air cells are clear. IMPRESSION: Atrophy with periventricular small vessel disease, stable. Prior lacunar infarct in the posterior limb of the right internal capsule and lateral right thalamus, stable. No acute infarct evident. No intracranial mass hemorrhage, or extra-axial fluid collection. Foci of arterial vascular calcification noted. There is ethmoid sinus disease. Electronically Signed   By: Lowella Grip III M.D.   On: 08/24/2017 15:47    Microbiology: Recent Results (from the past 240 hour(s))  Culture, Urine     Status: Abnormal (Preliminary result)   Collection Time: 08/24/17  4:08 PM  Result Value Ref Range Status   Specimen Description URINE, RANDOM  Final   Special Requests NONE  Final   Culture >=100,000 COLONIES/mL GRAM NEGATIVE RODS (A)  Final   Report Status PENDING  Incomplete     Labs: Basic Metabolic Panel:  Recent Labs Lab 08/24/17 1351 08/25/17 0553 08/25/17 1027 08/26/17 0234 08/27/17 0502  NA 150* 150*  --  149* 146*  K 4.2 4.2  --  4.2 3.8  CL 116* 117*  --  115* 112*  CO2  20* 21*  --  22 22  GLUCOSE 104* 109*  --  133* 142*  BUN 42* 39*  --  40* 40*  CREATININE 3.54* 3.32*  --  3.42* 3.30*  CALCIUM 9.0 8.9  --  9.1 8.9  MG  --   --  2.0  --   --   PHOS  --   --  2.7  --   --    Liver Function Tests:  Recent Labs Lab 08/24/17 1351  AST 21  ALT 25  ALKPHOS 89  BILITOT 1.4*  PROT 5.8*  ALBUMIN 3.3*   No results for input(s): LIPASE, AMYLASE in the last 168 hours. No results for input(s): AMMONIA in the last 168 hours. CBC:  Recent Labs Lab  08/24/17 1351 08/25/17 0553 08/26/17 0234  WBC 7.7 10.0 10.4  NEUTROABS 6.5  --   --   HGB 11.8* 12.5* 11.5*  HCT 38.8* 40.9 38.0*  MCV 89.8 89.9 89.8  PLT 186 131* 139*   Cardiac Enzymes:  Recent Labs Lab 08/24/17 1839 08/25/17 1924 08/26/17 0234  TROPONINI 0.12* 0.22* 0.22*   BNP: BNP (last 3 results)  Recent Labs  09/07/16 1517 02/04/17 1116  BNP 463.1* 581.8*    ProBNP (last 3 results) No results for input(s): PROBNP in the last 8760 hours.  CBG:  Recent Labs Lab 08/24/17 2145  GLUCAP 108*       Signed:  THOMPSON,DANIEL MD.  [facility] 08/27/2017, 1:09 PM

## 2017-08-27 NOTE — Progress Notes (Signed)
Pt left to [facility] per md orders. Iv was removed, site was benign. Pt left via [facility] with wife Joy following.

## 2017-08-28 LAB — URINE CULTURE: Culture: >=100000 — AB

## 2017-08-29 ENCOUNTER — Telehealth: Payer: Self-pay | Admitting: *Deleted

## 2017-08-29 NOTE — Telephone Encounter (Signed)
Pt was on TCM list admitted 08/24/17 for generalized weakness and fatigue with significant worsening over the last 1-2 days, UTI,and Fall. Pt was D/C 08/27/17 and sent to residential hospice home at [facility]. Pt will follow-up w/[facility] MD.../lmb

## 2017-08-30 ENCOUNTER — Telehealth: Payer: Self-pay | Admitting: Internal Medicine

## 2017-08-30 NOTE — Telephone Encounter (Addendum)
Returned call to the pt's wife as she was appreciative that I called back. She stated the pt was moved to Hospice & off Coumadin at this time. Also, expressed my sympathy as pt is in Hospice care at this time. Advised to call back if she needed anything that we could assist with & she stated she would.

## 2017-08-30 NOTE — Telephone Encounter (Signed)
New Message     Per pt wife stop calling, he is in hospice now.

## 2017-09-19 DEATH — deceased
